# Patient Record
Sex: Female | Born: 1945 | Race: White | Hispanic: No | Marital: Married | Smoking: Never smoker
Health system: Southern US, Community
[De-identification: ages and names within clinical notes are randomized; demographics above are authoritative.]

## PROBLEM LIST (undated history)

## (undated) DIAGNOSIS — H269 Unspecified cataract: Secondary | ICD-10-CM

## (undated) DIAGNOSIS — IMO0002 Reserved for concepts with insufficient information to code with codable children: Secondary | ICD-10-CM

## (undated) DIAGNOSIS — Z8619 Personal history of other infectious and parasitic diseases: Secondary | ICD-10-CM

## (undated) DIAGNOSIS — I10 Essential (primary) hypertension: Secondary | ICD-10-CM

## (undated) DIAGNOSIS — C50919 Malignant neoplasm of unspecified site of unspecified female breast: Secondary | ICD-10-CM

## (undated) DIAGNOSIS — K5792 Diverticulitis of intestine, part unspecified, without perforation or abscess without bleeding: Secondary | ICD-10-CM

## (undated) DIAGNOSIS — E7800 Pure hypercholesterolemia, unspecified: Secondary | ICD-10-CM

## (undated) DIAGNOSIS — L661 Lichen planopilaris, unspecified: Secondary | ICD-10-CM

## (undated) DIAGNOSIS — I2510 Atherosclerotic heart disease of native coronary artery without angina pectoris: Secondary | ICD-10-CM

## (undated) DIAGNOSIS — M1990 Unspecified osteoarthritis, unspecified site: Secondary | ICD-10-CM

## (undated) DIAGNOSIS — D649 Anemia, unspecified: Secondary | ICD-10-CM

## (undated) DIAGNOSIS — I729 Aneurysm of unspecified site: Secondary | ICD-10-CM

## (undated) DIAGNOSIS — K219 Gastro-esophageal reflux disease without esophagitis: Secondary | ICD-10-CM

## (undated) DIAGNOSIS — E119 Type 2 diabetes mellitus without complications: Secondary | ICD-10-CM

## (undated) DIAGNOSIS — T7840XA Allergy, unspecified, initial encounter: Secondary | ICD-10-CM

## (undated) DIAGNOSIS — G4730 Sleep apnea, unspecified: Secondary | ICD-10-CM

## (undated) DIAGNOSIS — C801 Malignant (primary) neoplasm, unspecified: Secondary | ICD-10-CM

## (undated) DIAGNOSIS — R011 Cardiac murmur, unspecified: Secondary | ICD-10-CM

## (undated) DIAGNOSIS — Z5189 Encounter for other specified aftercare: Secondary | ICD-10-CM

## (undated) DIAGNOSIS — Z803 Family history of malignant neoplasm of breast: Secondary | ICD-10-CM

## (undated) DIAGNOSIS — G709 Myoneural disorder, unspecified: Secondary | ICD-10-CM

## (undated) DIAGNOSIS — N6019 Diffuse cystic mastopathy of unspecified breast: Secondary | ICD-10-CM

## (undated) DIAGNOSIS — R32 Unspecified urinary incontinence: Secondary | ICD-10-CM

## (undated) HISTORY — DX: Unspecified cataract: H26.9

## (undated) HISTORY — DX: Encounter for other specified aftercare: Z51.89

## (undated) HISTORY — DX: Essential (primary) hypertension: I10

## (undated) HISTORY — DX: Diverticulitis of intestine, part unspecified, without perforation or abscess without bleeding: K57.92

## (undated) HISTORY — DX: Family history of malignant neoplasm of breast: Z80.3

## (undated) HISTORY — DX: Pure hypercholesterolemia, unspecified: E78.00

## (undated) HISTORY — DX: Anemia, unspecified: D64.9

## (undated) HISTORY — DX: Type 2 diabetes mellitus without complications: E11.9

## (undated) HISTORY — DX: Diffuse cystic mastopathy of unspecified breast: N60.19

## (undated) HISTORY — DX: Unspecified osteoarthritis, unspecified site: M19.90

## (undated) HISTORY — DX: Cardiac murmur, unspecified: R01.1

## (undated) HISTORY — DX: Reserved for concepts with insufficient information to code with codable children: IMO0002

## (undated) HISTORY — PX: PARTIAL HIP ARTHROPLASTY: SHX733

## (undated) HISTORY — DX: Gastro-esophageal reflux disease without esophagitis: K21.9

## (undated) HISTORY — DX: Lichen planopilaris, unspecified: L66.10

## (undated) HISTORY — DX: Allergy, unspecified, initial encounter: T78.40XA

## (undated) HISTORY — PX: FINGER SURGERY: SHX640

## (undated) HISTORY — PX: MASTECTOMY: SHX3

## (undated) HISTORY — DX: Malignant neoplasm of unspecified site of unspecified female breast: C50.919

## (undated) HISTORY — DX: Lichen planopilaris: L66.1

## (undated) HISTORY — DX: Personal history of other infectious and parasitic diseases: Z86.19

## (undated) HISTORY — PX: REPLACEMENT TOTAL KNEE: SUR1224

## (undated) HISTORY — DX: Unspecified urinary incontinence: R32

---

## 1964-03-26 HISTORY — PX: RHINOPLASTY: SHX2354

## 1975-02-24 HISTORY — PX: BREAST SURGERY: SHX581

## 1976-01-25 HISTORY — PX: BREAST SURGERY: SHX581

## 1978-08-26 HISTORY — PX: FOOT SURGERY: SHX648

## 1981-10-24 HISTORY — PX: BREAST SURGERY: SHX581

## 1994-01-24 HISTORY — PX: DILATION AND CURETTAGE OF UTERUS: SHX78

## 1998-07-26 HISTORY — PX: BREAST SURGERY: SHX581

## 1999-01-10 ENCOUNTER — Other Ambulatory Visit: Admission: RE | Admit: 1999-01-10 | Discharge: 1999-01-10 | Payer: Self-pay | Admitting: *Deleted

## 1999-02-20 ENCOUNTER — Other Ambulatory Visit: Admission: RE | Admit: 1999-02-20 | Discharge: 1999-02-20 | Payer: Self-pay | Admitting: *Deleted

## 1999-03-26 ENCOUNTER — Other Ambulatory Visit: Admission: RE | Admit: 1999-03-26 | Discharge: 1999-03-26 | Payer: Self-pay | Admitting: Gastroenterology

## 1999-03-26 ENCOUNTER — Encounter: Payer: Self-pay | Admitting: Gastroenterology

## 1999-03-26 ENCOUNTER — Encounter (INDEPENDENT_AMBULATORY_CARE_PROVIDER_SITE_OTHER): Payer: Self-pay

## 1999-05-08 ENCOUNTER — Encounter: Payer: Self-pay | Admitting: Gastroenterology

## 2000-01-31 ENCOUNTER — Other Ambulatory Visit: Admission: RE | Admit: 2000-01-31 | Discharge: 2000-01-31 | Payer: Self-pay | Admitting: Obstetrics and Gynecology

## 2001-04-17 ENCOUNTER — Other Ambulatory Visit: Admission: RE | Admit: 2001-04-17 | Discharge: 2001-04-17 | Payer: Self-pay | Admitting: Obstetrics and Gynecology

## 2002-04-19 ENCOUNTER — Other Ambulatory Visit: Admission: RE | Admit: 2002-04-19 | Discharge: 2002-04-19 | Payer: Self-pay | Admitting: Obstetrics & Gynecology

## 2003-06-01 ENCOUNTER — Other Ambulatory Visit: Admission: RE | Admit: 2003-06-01 | Discharge: 2003-06-01 | Payer: Self-pay | Admitting: Obstetrics & Gynecology

## 2004-12-26 HISTORY — PX: PLACEMENT OF BREAST IMPLANTS: SHX6334

## 2006-01-09 ENCOUNTER — Ambulatory Visit: Payer: Self-pay

## 2008-07-12 ENCOUNTER — Ambulatory Visit: Payer: Self-pay | Admitting: Unknown Physician Specialty

## 2008-07-26 HISTORY — PX: FOOT SURGERY: SHX648

## 2009-02-07 ENCOUNTER — Encounter (INDEPENDENT_AMBULATORY_CARE_PROVIDER_SITE_OTHER): Payer: Self-pay | Admitting: *Deleted

## 2009-06-26 ENCOUNTER — Ambulatory Visit: Payer: Self-pay | Admitting: Gastroenterology

## 2009-07-10 ENCOUNTER — Ambulatory Visit: Payer: Self-pay | Admitting: Gastroenterology

## 2009-10-25 ENCOUNTER — Encounter (INDEPENDENT_AMBULATORY_CARE_PROVIDER_SITE_OTHER): Payer: Self-pay | Admitting: *Deleted

## 2009-11-07 ENCOUNTER — Ambulatory Visit: Payer: Self-pay | Admitting: Gastroenterology

## 2009-11-07 DIAGNOSIS — K921 Melena: Secondary | ICD-10-CM | POA: Insufficient documentation

## 2009-11-07 DIAGNOSIS — R109 Unspecified abdominal pain: Secondary | ICD-10-CM | POA: Insufficient documentation

## 2009-11-07 DIAGNOSIS — E11319 Type 2 diabetes mellitus with unspecified diabetic retinopathy without macular edema: Secondary | ICD-10-CM | POA: Insufficient documentation

## 2009-11-14 ENCOUNTER — Ambulatory Visit: Payer: Self-pay | Admitting: Gastroenterology

## 2009-11-20 ENCOUNTER — Telehealth: Payer: Self-pay | Admitting: Gastroenterology

## 2009-11-24 ENCOUNTER — Ambulatory Visit: Payer: Self-pay | Admitting: Gastroenterology

## 2009-11-24 LAB — CONVERTED CEMR LAB
Fecal Occult Blood: NEGATIVE
OCCULT 3: NEGATIVE

## 2009-12-11 ENCOUNTER — Ambulatory Visit: Payer: Self-pay | Admitting: Gastroenterology

## 2010-09-27 NOTE — Assessment & Plan Note (Signed)
Summary: ABD PAIN/YF   History of Present Illness Visit Type: new patient  Primary GI MD: Melvia Heaps MD [facility] Primary Provider: Silver Huguenin, MD  Requesting Provider: n/a Chief Complaint: Epigastric pain, bloating, black tarry stools, and acid reflux  History of Present Illness:   Katie [name] is a pleasant 65 year old white female referred at the request of Dr. Hyacinth Meeker for evaluation of melena.  Approximately 3 weeks ago, while on a cruise, she developed sharp, burning upper abdominal pain followed by black, tarry stools.  She was seen by a ship physician.  Stools checked several days later were Hemoccult-negative though she claims the stools had normalized in color by that point.  On October 12, 2009 hemoglobin was 11.4.  She has been taking Prilosec during this time.  While her sharp pain has subsided she continues to complain of burning epigastric pain and some pyrosis.  Screening colonoscopy from November, 2010 was pertinent for mild diverticulosis.  She has a remote history of peptic ulcer disease.  She is on no gastric irritants including nonsteroidals.   GI Review of Systems    Reports abdominal pain, acid reflux, belching, bloating, and  heartburn.     Location of  Abdominal pain: epigastric area.    Denies chest pain, dysphagia with liquids, dysphagia with solids, loss of appetite, nausea, vomiting, vomiting blood, weight loss, and  weight gain.      Reports black tarry stools.     Denies anal fissure, change in bowel habit, constipation, diarrhea, diverticulosis, fecal incontinence, heme positive stool, hemorrhoids, irritable bowel syndrome, jaundice, light color stool, liver problems, rectal bleeding, and  rectal pain.    Current Medications (verified): 1)  Prilosec 20 Mg Cpdr (Omeprazole) .[name].. 1 Tablet By Mouth Once Daily 2)  Cozaar 50 Mg Tabs (Losartan Potassium) .[name].. 1 Tablet By Mouth Once Daily 3)  Lipitor 10 Mg Tabs (Atorvastatin Calcium) .... One Tablet By Mouth Once  Daily 4)  Metformin Hcl 500 Mg Tabs (Metformin Hcl) .... One Tablet By Mouth Once Daily 5)  Claritin 10 Mg Tabs (Loratadine) .... As Needed For Allergies 6)  Flonase 50 Mcg/act Susp (Fluticasone Propionate) .... As Needed 7)  Maalox Max 1000-60 Mg Chew (Calcium Carbonate-Simethicone) .... As Needed  Allergies (verified): 1)  ! Erythromycin 2)  ! Cipro  Past History:  Past Medical History: Diverticulosis GERD Duodenal Ulcer Hypertension Diabetes Hyperlipidemia  Past Surgical History: Bilateral Breast Surgery for Fibrocystic Tissue   Family History: Family History of Breast Cancer:Sister  No FH of Colon Cancer: Family History of Diabetes: Mother, Father, and Brother  Family History of Heart Disease: Father and Brother   Social History: Occupation: Retired Married Patient has never smoked.  Alcohol Use - no Illicit Drug Use - no Smoking Status:  never Drug Use:  no  Review of Systems       The patient complains of allergy/sinus and cough.  The patient denies anemia, anxiety-new, arthritis/joint pain, back pain, blood in urine, breast changes/lumps, change in vision, confusion, coughing up blood, depression-new, fainting, fatigue, fever, headaches-new, hearing problems, heart murmur, heart rhythm changes, itching, menstrual pain, muscle pains/cramps, night sweats, nosebleeds, pregnancy symptoms, shortness of breath, skin rash, sleeping problems, sore throat, swelling of feet/legs, swollen lymph glands, thirst - excessive , urination - excessive , urination changes/pain, urine leakage, vision changes, and voice change.         All other systems were reviewed and were negative   Vital Signs:  Patient profile:   65 year old female  Height:      62 inches Weight:      179 pounds BMI:     32.86 BSA:     1.83 Pulse rate:   80 / minute Pulse rhythm:   regular BP sitting:   132 / 76  (left arm) Cuff size:   regular  Vitals Entered By: Ok Anis CMA (November 07, 2009 3:31  PM)  Physical Exam  Additional Exam:  On physical exam she is a well-developed well-nourished female  skin: anicteric HEENT: normocephalic; PEERLA; no nasal or pharyngeal abnormalities neck: supple nodes: no cervical lymphadenopathy chest: clear to ausculatation and percussion heart: no murmurs, gallops, or rubs abd: soft, nontender; BS normoactive; no abdominal masses, tenderness, organomegaly rectal: deferred ext: no cynanosis, clubbing, edema skeletal: no deformities neuro: oriented x 3; no focal abnormalities    Impression & Recommendations:  Problem # 1:  MELENA (ICD-578.1) By history she had a limited GI bleed.  I suspect she may have active peptic ulcer disease complicated by bleeding.  Upper GI neoplasm and AVMs are other less likely possibilities.  Recommendations #1 continue Prilosec #2 upper endoscopy  Risks, alternatives, and complications of the procedure, including bleeding, perforation, and possible need for surgery, were explained to the patient.  Patient's questions were answered.  Other Orders: EGD (EGD)  Patient Instructions: 1)  Conscious Sedation brochure given.  2)  Upper Endoscopy brochure given.  3)  Your EGD is scheduled for 11/14/2009 at 3:30pm 4)  You have been instructed on your diabetic medications 5)  CC Dr. Rockie Neighbours 6)  The medication list was reviewed and reconciled.  All changed / newly prescribed medications were explained.  A complete medication list was provided to the patient / caregiver.

## 2010-09-27 NOTE — Progress Notes (Signed)
Summary: Sooner Appt.  Phone Note Call from Patient Call back at Home Phone [phone]   Call For: Dr Arlyce Dice Summary of Call: Refused to schedule next available on May 9th because she was told after her procedure she needed a 3 week follow up. Initial call taken by: [name] [facility],  November 20, 2009 9:31 AM  Follow-up for Phone Call        Pt. will see Dr.Coila Wardell on 12-11-09 at 11:15am. Pt. instructed to call back as needed.  Follow-up by: Laureen Ochs LPN,  November 20, 2009 10:46 AM

## 2010-09-27 NOTE — Assessment & Plan Note (Signed)
Summary: F/U FROM ENDOSCOPY               Katie [name]   History of Present Illness Visit Type: Follow-up Visit Primary GI MD: Katie Heaps MD [facility] Primary Provider: Silver Huguenin, MD  Requesting Provider: n/a Chief Complaint: follow up from EGD, pt states abdominal pain and reflux are improving.  Reflux is better on prilosec History of Present Illness:   Katie [name] past return following upper endoscopy.  This exam was entirely normal.  Followup Hemoccults were also negative.  She has no GI complaints.   GI Review of Systems    Reports abdominal pain and  acid reflux.      Denies belching, bloating, chest pain, dysphagia with liquids, dysphagia with solids, heartburn, loss of appetite, nausea, vomiting, vomiting blood, weight loss, and  weight gain.        Denies anal fissure, black tarry stools, change in bowel habit, constipation, diarrhea, diverticulosis, fecal incontinence, heme positive stool, hemorrhoids, irritable bowel syndrome, jaundice, light color stool, liver problems, rectal bleeding, and  rectal pain.    Current Medications (verified): 1)  Prilosec 20 Mg Cpdr (Omeprazole) .[name].. 1 Tablet By Mouth Once Daily 2)  Cozaar 100 Mg Tabs (Losartan Potassium) .... Take 1 Tablet By Mouth Once A Day 3)  Lipitor 10 Mg Tabs (Atorvastatin Calcium) .... One Tablet By Mouth Once Daily 4)  Metformin Hcl 500 Mg Tabs (Metformin Hcl) .... One Tablet By Mouth Once Daily 5)  Claritin 10 Mg Tabs (Loratadine) .... As Needed For Allergies 6)  Flonase 50 Mcg/act Susp (Fluticasone Propionate) .... As Needed 7)  Maalox Max 1000-60 Mg Chew (Calcium Carbonate-Simethicone) .... As Needed  Allergies (verified): 1)  ! Erythromycin 2)  ! Cipro  Past History:  Past Medical History: Reviewed history from 11/07/2009 and no changes required. Diverticulosis GERD Duodenal Ulcer Hypertension Diabetes Hyperlipidemia  Past Surgical History: Reviewed history from 11/07/2009 and no changes  required. Bilateral Breast Surgery for Fibrocystic Tissue   Family History: Reviewed history from 11/07/2009 and no changes required. Family History of Breast Cancer:Sister  No FH of Colon Cancer: Family History of Diabetes: Mother, Father, and Brother  Family History of Heart Disease: Father and Brother   Social History: Reviewed history from 11/07/2009 and no changes required. Occupation: Retired Married Patient has never smoked.  Alcohol Use - no Illicit Drug Use - no  Review of Systems  The patient denies allergy/sinus, anemia, anxiety-new, arthritis/joint pain, back pain, blood in urine, breast changes/lumps, confusion, cough, coughing up blood, depression-new, fainting, fatigue, fever, headaches-new, hearing problems, heart murmur, heart rhythm changes, itching, menstrual pain, muscle pains/cramps, night sweats, nosebleeds, pregnancy symptoms, shortness of breath, skin rash, sleeping problems, sore throat, swelling of feet/legs, swollen lymph glands, thirst - excessive, urination - excessive, urination changes/pain, urine leakage, vision changes, and voice change.    Vital Signs:  Patient profile:   65 year old female Height:      62 inches Weight:      178 pounds BMI:     32.67 Pulse rate:   84 / minute Pulse rhythm:   regular BP sitting:   130 / 80  (left arm) Cuff size:   regular  Vitals Entered By: Francee Piccolo CMA Duncan Dull) (December 11, 2009 10:58 AM)   Impression & Recommendations:  Problem # 1:  MELENA (ICD-578.1) This has clinically resolved.  Presumably etiology for this was active peptic disease.  There is no evidence for ongoing GI disease at this time.  Recommendations #1  taper then discontinue Prilosec  Patient Instructions: 1)  The medication list was reviewed and reconciled.  All changed / newly prescribed medications were explained.  A complete medication list was provided to the patient / caregiver. 2)  cc Katie [name]

## 2010-09-27 NOTE — Letter (Signed)
Summary: New Patient letter  [facility]  [address] [place], [place] [address]   Phone: [phone]  Fax: [phone]       10/25/2009 MRN: [id number]  Katie [name] [address] [place], [place]  [address]  [place]  Dear Katie [name],  Welcome to the Gastroenterology Division at [facility].    You are scheduled to see Dr.  Arlyce Dice on 11-07-09 at 3:30pm on the 3rd floor at [facility], [address]  We ask that you try to arrive at our office 15 minutes prior to your appointment time to allow for check-in.  We would like you to complete the enclosed self-administered evaluation form prior to your visit and bring it with you on the day of your appointment.  We will review it with you.  Also, please bring a complete list of all your medications or, if you prefer, bring the medication bottles and we will list them.  Please bring your insurance card so that we may make a copy of it.  If your insurance requires a referral to see a specialist, please bring your referral form from your primary care physician.  Co-payments are due at the time of your visit and may be paid by cash, check or credit card.     Your office visit will consist of a consult with your physician (includes a physical exam), any laboratory testing he/she may order, scheduling of any necessary diagnostic testing (e.g. x-ray, ultrasound, CT-scan), and scheduling of a procedure (e.g. Endoscopy, Colonoscopy) if required.  Please allow enough time on your schedule to allow for any/all of these possibilities.    If you cannot keep your appointment, please call [phone] to cancel or reschedule prior to your appointment date.  This allows [place] the opportunity to schedule an appointment for another patient in need of care.  If you do not cancel or reschedule by 5 p.m. the business day prior to your appointment date, you will be charged a $50.00 late cancellation/no-show fee.    Thank you for choosing  [facility] for your medical needs.  We appreciate the opportunity to care for you.  Please visit [place] at our website  to learn more about our practice.                     Sincerely,                                                             The Gastroenterology Division

## 2010-09-27 NOTE — Procedures (Signed)
Summary: EGD/[facility]  EGD/[facility]   Imported By: Sherian Rein 11/08/2009 09:58:50  _____________________________________________________________________  External Attachment:    Type:   Image     Comment:   External Document

## 2010-09-27 NOTE — Miscellaneous (Signed)
  Clinical Lists Changes  Medications: Added new medication of HYOMAX-SL 0.125 MG SUBL (HYOSCYAMINE SULFATE) take 2 tabs s.l. q4h prn - Signed Rx of HYOMAX-SL 0.125 MG SUBL (HYOSCYAMINE SULFATE) take 2 tabs s.l. q4h prn;  #15 x 1;  Signed;  Entered by: Louis Meckel MD;  Authorized by: Louis Meckel MD;  Method used: Electronically to [facility]. [facility] [phone]*, [address] [place], [place]  [id number], Ph: [phone], Fax: [phone]    Prescriptions: HYOMAX-SL 0.125 MG SUBL (HYOSCYAMINE SULFATE) take 2 tabs s.l. q4h prn  #15 x 1   Entered and Authorized by:   Louis Meckel MD   Signed by:   Louis Meckel MD on 11/14/2009   Method used:   Electronically to        [facility]. [address] [phone]* (retail)       [address] [place], [place]  [id number]       Ph: [phone]       Fax: [phone]   RxID:   [id number]

## 2010-09-27 NOTE — Procedures (Signed)
Summary: Upper Endoscopy  Patient: Katie [name] Note: All result statuses are Final unless otherwise noted.  Tests: (1) Upper Endoscopy (EGD)   EGD Upper Endoscopy       DONE     [facility]     [address]     [place], [place]  [address]           ENDOSCOPY PROCEDURE REPORT           PATIENT:  Katie [name], Katie [name]  MR#:  [id number]     BIRTHDATE:  1946/05/07, 63 yrs. old  GENDER:  female           ENDOSCOPIST:  Barbette Hair. Arlyce Dice, MD     Referred by:           PROCEDURE DATE:  11/14/2009     PROCEDURE:  EGD, diagnostic     ASA CLASS:  Class II     INDICATIONS:           MEDICATIONS:   Fentanyl 75 mcg IV, Versed 7 mg IV, glycopyrrolate     (Robinal) 0.2 mg IV, 0.6cc simethancone 0.6 cc IV     TOPICAL ANESTHETIC:  Cetacaine Spray           DESCRIPTION OF PROCEDURE:   After the risks benefits and     alternatives of the procedure were thoroughly explained, informed     consent was obtained.  The LB [id number] endoscope was     introduced through the mouth and advanced to the third portion of     the duodenum, without limitations.  The instrument was slowly     withdrawn as the mucosa was fully examined.     <<PROCEDUREIMAGES>>           The upper, middle, and distal third of the esophagus were     carefully inspected and no abnormalities were noted. The z-line     was well seen at the GEJ. The endoscope was pushed into the fundus     which was normal including a retroflexed view. The antrum,gastric     body, first and second part of the duodenum were unremarkable (see     image1, image2, image3, image4, image5, image6, image7, image8,     and image9).    Retroflexed views revealed no abnormalities.     The scope was then withdrawn from the patient and the procedure     completed.           COMPLICATIONS:  None           ENDOSCOPIC IMPRESSION:     1) Normal EGD     RECOMMENDATIONS:     1) continue current medications - prilosec     2) hyomax as needed  3) Call office next 2-3 days to schedule an office appointment     for 3 weeks           REPEAT EXAM:  No           ______________________________     Barbette Hair. Arlyce Dice, MD           CC:  Silver Huguenin, MD           n.     Rosalie Doctor:   Barbette Hair. Kaplan at 11/14/2009 02:55 PM           Jani Gravel, [phone]  Note: An exclamation mark (!) indicates a result that was not dispersed into the flowsheet. Document Creation Date: 11/14/2009 2:55 PM  _______________________________________________________________________  (1) Order result status: Final Collection or observation date-time: 11/14/2009 14:46 Requested date-time:  Receipt date-time:  Reported date-time:  Referring Physician:   Ordering Physician: Melvia Heaps [phone]) Specimen Source:  Source: Launa Grill Order Number: [id number] Lab site:

## 2010-09-27 NOTE — Letter (Signed)
Summary: EGD Instructions  [facility]  [address] [place], [place] [address]   Phone: [phone]  Fax: [phone]       Katie [name]    06-04-1946    MRN: [id number]       Procedure Day /Date:TUESDAY 11/14/2009     Arrival Time:2:30PM     Procedure Time:3:30PM     Location of Procedure:                    X  [facility] (4th Floor)   PREPARATION FOR ENDOSCOPY   On 11/14/2009  THE DAY OF THE PROCEDURE:  1.   No solid foods, milk or milk products are allowed after midnight the night before your procedure.  2.   Do not drink anything colored red or purple.  Avoid juices with pulp.  No orange juice.  3.  You may drink clear liquids until 1:30PM  , which is 2 hours before your procedure.                                                                                                CLEAR LIQUIDS INCLUDE: Water Jello Ice Popsicles Tea (sugar ok, no milk/cream) Powdered fruit flavored drinks Coffee (sugar ok, no milk/cream) Gatorade Juice: apple, white grape, white cranberry  Lemonade Clear bullion, consomm, broth Carbonated beverages (any kind) Strained chicken noodle soup Hard Candy   MEDICATION INSTRUCTIONS  Unless otherwise instructed, you should take regular prescription medications with a small sip of water as early as possible the morning of your procedure.  Diabetic patients - see separate instructions.           OTHER INSTRUCTIONS  You will need a responsible adult at least 66 years of age to accompany you and drive you home.   This person must remain in the waiting room during your procedure.  Wear loose fitting clothing that is easily removed.  Leave jewelry and other valuables at home.  However, you may wish to bring a book to read or an iPod/MP3 player to listen to music as you wait for your procedure to start.  Remove all body piercing jewelry and leave at home.  Total time from sign-in until discharge is  approximately 2-3 hours.  You should go home directly after your procedure and rest.  You can resume normal activities the day after your procedure.  The day of your procedure you should not:   Drive   Make legal decisions   Operate machinery   Drink alcohol   Return to work  You will receive specific instructions about eating, activities and medications before you leave.    The above instructions have been reviewed and explained to me by   _______________________    I fully understand and can verbalize these instructions _____________________________ Date _________

## 2010-09-27 NOTE — Progress Notes (Signed)
Summary: [facility], [facility]   Imported By: Sherian Rein 11/08/2009 09:59:54  _____________________________________________________________________  External Attachment:    Type:   Image     Comment:   External Document

## 2010-09-27 NOTE — Letter (Signed)
Summary: Diabetic Instructions  [facility]  [address] [place], [place] [address]   Phone: [phone]  Fax: [phone]    Katie [name] Jun 13, 1946 MRN: [id number]   X   ORAL DIABETIC MEDICATION INSTRUCTIONS  METFORMIN  The day before your procedure:   Take your diabetic pill as you do normally  The day of your procedure:   Do not take your diabetic pill    We will check your blood sugar levels during the admission process and again in Recovery before discharging you home  ________________________________________________________________________  _  _   INSULIN (LONG ACTING) MEDICATION INSTRUCTIONS (Lantus, NPH, 70/30, Humulin, Novolin-N)   The day before your procedure:   Take  your regular evening dose    The day of your procedure:   Do not take your morning dose    _  _   INSULIN (SHORT ACTING) MEDICATION INSTRUCTIONS (Regular, Humulog, Novolog)   The day before your procedure:   Do not take your evening dose   The day of your procedure:   Do not take your morning dose   _  _   INSULIN PUMP MEDICATION INSTRUCTIONS  We will contact the physician managing your diabetic care for written dosage instructions for the day before your procedure and the day of your procedure.  Once we have received the instructions, we will contact you.

## 2010-11-19 LAB — GLUCOSE, CAPILLARY
Glucose-Capillary: 101 mg/dL — ABNORMAL HIGH (ref 70–99)
Glucose-Capillary: 113 mg/dL — ABNORMAL HIGH (ref 70–99)

## 2010-11-28 LAB — GLUCOSE, CAPILLARY: Glucose-Capillary: 116 mg/dL — ABNORMAL HIGH (ref 70–99)

## 2011-01-04 ENCOUNTER — Ambulatory Visit: Payer: Self-pay | Admitting: Unknown Physician Specialty

## 2011-07-31 ENCOUNTER — Ambulatory Visit: Payer: Self-pay | Admitting: Unknown Physician Specialty

## 2011-11-05 ENCOUNTER — Ambulatory Visit: Payer: Self-pay | Admitting: Unknown Physician Specialty

## 2011-12-12 ENCOUNTER — Emergency Department: Payer: Self-pay | Admitting: Emergency Medicine

## 2012-08-06 ENCOUNTER — Ambulatory Visit: Payer: Self-pay | Admitting: Unknown Physician Specialty

## 2012-10-12 ENCOUNTER — Telehealth: Payer: Self-pay | Admitting: Gastroenterology

## 2012-10-12 NOTE — Telephone Encounter (Signed)
Offered to give pt an appt with PA tomorrow but pt would rather see MD. Pt scheduled to see Dr. Arlyce Dice 10/14/12@10 :Katie [name] to fax records and notify pt of appt date and time.

## 2012-10-13 ENCOUNTER — Encounter: Payer: Self-pay | Admitting: *Deleted

## 2012-10-14 ENCOUNTER — Ambulatory Visit (INDEPENDENT_AMBULATORY_CARE_PROVIDER_SITE_OTHER): Payer: Medicare Other | Admitting: Gastroenterology

## 2012-10-14 ENCOUNTER — Encounter: Payer: Self-pay | Admitting: Gastroenterology

## 2012-10-14 VITALS — BP 130/68 | HR 64 | Ht 62.0 in | Wt 182.8 lb

## 2012-10-14 DIAGNOSIS — R1032 Left lower quadrant pain: Secondary | ICD-10-CM

## 2012-10-14 MED ORDER — HYOSCYAMINE SULFATE ER 0.375 MG PO TBCR: EXTENDED_RELEASE_TABLET | ORAL | Status: DC

## 2012-10-14 NOTE — Patient Instructions (Addendum)
Follow up as needed

## 2012-10-14 NOTE — Assessment & Plan Note (Addendum)
The patient probably had low-grade diverticulitis in December, 2013. He has residual, mild left lower quadrant pain which likely is related to this. I doubt she has an ongoing acute inflammatory process.  Recommendations #1 hyomax as needed for pain

## 2012-10-14 NOTE — Progress Notes (Signed)
History of Present Illness: Katie [name] 67 year old white female referred at the request of Dr. Hyacinth Meeker for evaluation of abdominal pain. In late December, 2013 she developed sharp left lower quadrant pain. CT scan at [facility], by report, showed minimal thickening of the sigmoid colon raising the question of sigmoid diverticulitis. She was treated with antibiotics with resolution of the severe pain. Since that time she's had intermittent, mild residual discomfort in the left lower quadrant. There has been no change in bowel habits. She has had no bleeding. Screening colonoscopy in 2010 demonstrated diverticulosis. Upper endoscopy in 2011 was normal.    Past Medical History  Diagnosis Date  . Hypertension   . DJD (degenerative joint disease)   . DM type 2 (diabetes mellitus, type 2)   . Hypercholesteremia   . Lichen planopilaris   . Fibrocystic breast disease   . Anemia    Past Surgical History  Procedure Laterality Date  . Breast implants    . Dilation and curettage of uterus    . Finger surgery     family history includes Breast cancer in an unspecified family member; Coronary artery disease in an unspecified family member; Diabetes in an unspecified family member; and Leukemia in an unspecified family member. Current Outpatient Prescriptions  Medication Sig Dispense Refill  . atorvastatin (LIPITOR) 10 MG tablet Take 10 mg by mouth daily.      [name] azelastine (ASTELIN) 137 MCG/SPRAY nasal spray Place 1 spray into the nose 2 (two) times daily. Use in each nostril as directed      . calcium carbonate (OS-CAL) 600 MG TABS Take 600 mg by mouth daily.      . celecoxib (CELEBREX) 200 MG capsule Take 200 mg by mouth 2 (two) times daily.      [name] esomeprazole (NEXIUM) 40 MG capsule Take 40 mg by mouth daily before breakfast.      . fluticasone (FLONASE) 50 MCG/ACT nasal spray Place 2 sprays into the nose daily.      . hydrochlorothiazide (HYDRODIURIL) 25 MG tablet Take 25 mg by mouth  daily.      [name] loratadine (CLARITIN) 10 MG tablet Take 10 mg by mouth daily.      [name] losartan (COZAAR) 100 MG tablet Take 100 mg by mouth daily.      . pioglitazone (ACTOS) 15 MG tablet Take 15 mg by mouth daily.       No current facility-administered medications for this visit.   Allergies as of 10/14/2012 - Review Complete 10/14/2012  Allergen Reaction Noted  . Ciprofloxacin  06/26/2009  . Daypro (oxaprozin)  10/13/2012  . Erythromycin  06/26/2009  . Flonase (fluticasone propionate)  10/13/2012  . Nsaids  10/13/2012  . Vioxx (rofecoxib)  10/13/2012    reports that she has never smoked. She has never used smokeless tobacco. She reports that she does not drink alcohol or use illicit drugs.     Review of Systems: Pertinent positive and negative review of systems were noted in the above HPI section. All other review of systems were otherwise negative.  Vital signs were reviewed in today's medical record Physical Exam: General: Well developed , well nourished, no acute distress Skin: anicteric Head: Normocephalic and atraumatic Eyes:  sclerae anicteric, EOMI Ears: Normal auditory acuity Mouth: No deformity or lesions Neck: Supple, no masses or thyromegaly Lungs: Clear throughout to auscultation Heart: Regular rate and rhythm; no murmurs, rubs or bruits Abdomen: Soft, non tender and non distended. No masses, hepatosplenomegaly or hernias noted. Normal  Bowel sounds Rectal:deferred Musculoskeletal: Symmetrical with no gross deformities  Skin: No lesions on visible extremities Pulses:  Normal pulses noted Extremities: No clubbing, cyanosis, edema or deformities noted Neurological: Alert oriented x 4, grossly nonfocal Cervical Nodes:  No significant cervical adenopathy Inguinal Nodes: No significant inguinal adenopathy Psychological:  Alert and cooperative. Normal mood and affect

## 2013-02-01 ENCOUNTER — Ambulatory Visit: Payer: Self-pay | Admitting: Unknown Physician Specialty

## 2013-04-14 ENCOUNTER — Ambulatory Visit: Payer: Self-pay | Admitting: Unknown Physician Specialty

## 2013-04-19 HISTORY — PX: JOINT REPLACEMENT: SHX530

## 2013-04-23 ENCOUNTER — Encounter: Payer: Self-pay | Admitting: Internal Medicine

## 2013-04-26 ENCOUNTER — Encounter: Payer: Self-pay | Admitting: Internal Medicine

## 2013-04-29 LAB — CBC WITH DIFFERENTIAL/PLATELET
Basophil #: 0.1 10*3/uL (ref 0.0–0.1)
Basophil %: 0.8 %
MCH: 32.5 pg (ref 26.0–34.0)
Monocyte #: 0.7 x10 3/mm (ref 0.2–0.9)
Monocyte %: 9.6 %
Neutrophil %: 63.9 %
Platelet: 378 10*3/uL (ref 150–440)
RDW: 15.5 % — ABNORMAL HIGH (ref 11.5–14.5)
WBC: 7.1 10*3/uL (ref 3.6–11.0)

## 2013-05-26 ENCOUNTER — Encounter: Payer: Self-pay | Admitting: Internal Medicine

## 2013-08-31 DIAGNOSIS — S72009D Fracture of unspecified part of neck of unspecified femur, subsequent encounter for closed fracture with routine healing: Secondary | ICD-10-CM | POA: Diagnosis not present

## 2013-10-28 DIAGNOSIS — Z79899 Other long term (current) drug therapy: Secondary | ICD-10-CM | POA: Diagnosis not present

## 2013-10-28 DIAGNOSIS — E785 Hyperlipidemia, unspecified: Secondary | ICD-10-CM | POA: Diagnosis not present

## 2013-11-04 DIAGNOSIS — E785 Hyperlipidemia, unspecified: Secondary | ICD-10-CM | POA: Diagnosis not present

## 2013-11-04 DIAGNOSIS — E119 Type 2 diabetes mellitus without complications: Secondary | ICD-10-CM | POA: Diagnosis not present

## 2013-11-29 DIAGNOSIS — R42 Dizziness and giddiness: Secondary | ICD-10-CM | POA: Diagnosis not present

## 2013-11-29 DIAGNOSIS — T887XXA Unspecified adverse effect of drug or medicament, initial encounter: Secondary | ICD-10-CM | POA: Diagnosis not present

## 2013-12-08 DIAGNOSIS — L819 Disorder of pigmentation, unspecified: Secondary | ICD-10-CM | POA: Diagnosis not present

## 2013-12-08 DIAGNOSIS — L719 Rosacea, unspecified: Secondary | ICD-10-CM | POA: Diagnosis not present

## 2013-12-08 DIAGNOSIS — L439 Lichen planus, unspecified: Secondary | ICD-10-CM | POA: Diagnosis not present

## 2013-12-29 DIAGNOSIS — N390 Urinary tract infection, site not specified: Secondary | ICD-10-CM | POA: Diagnosis not present

## 2013-12-29 DIAGNOSIS — R300 Dysuria: Secondary | ICD-10-CM | POA: Diagnosis not present

## 2014-02-09 DIAGNOSIS — E785 Hyperlipidemia, unspecified: Secondary | ICD-10-CM | POA: Diagnosis not present

## 2014-02-15 DIAGNOSIS — E785 Hyperlipidemia, unspecified: Secondary | ICD-10-CM | POA: Diagnosis not present

## 2014-02-15 DIAGNOSIS — E119 Type 2 diabetes mellitus without complications: Secondary | ICD-10-CM | POA: Diagnosis not present

## 2014-03-01 DIAGNOSIS — Z96649 Presence of unspecified artificial hip joint: Secondary | ICD-10-CM | POA: Diagnosis not present

## 2014-03-01 DIAGNOSIS — Z471 Aftercare following joint replacement surgery: Secondary | ICD-10-CM | POA: Diagnosis not present

## 2014-03-01 DIAGNOSIS — Z9660 Presence of unspecified orthopedic joint implant: Secondary | ICD-10-CM | POA: Insufficient documentation

## 2014-04-18 DIAGNOSIS — Z23 Encounter for immunization: Secondary | ICD-10-CM | POA: Diagnosis not present

## 2014-05-16 DIAGNOSIS — E119 Type 2 diabetes mellitus without complications: Secondary | ICD-10-CM | POA: Diagnosis not present

## 2014-05-16 DIAGNOSIS — E785 Hyperlipidemia, unspecified: Secondary | ICD-10-CM | POA: Diagnosis not present

## 2014-05-16 LAB — LIPID PANEL
CHOLESTEROL: 150 mg/dL (ref 0–200)
HDL: 40 mg/dL (ref 35–70)
LDL CALC: 72 mg/dL
LDl/HDL Ratio: 3.8
Triglycerides: 191 mg/dL — AB (ref 40–160)

## 2014-05-23 DIAGNOSIS — S139XXA Sprain of joints and ligaments of unspecified parts of neck, initial encounter: Secondary | ICD-10-CM | POA: Diagnosis not present

## 2014-05-23 DIAGNOSIS — M47812 Spondylosis without myelopathy or radiculopathy, cervical region: Secondary | ICD-10-CM | POA: Diagnosis not present

## 2014-05-23 DIAGNOSIS — E119 Type 2 diabetes mellitus without complications: Secondary | ICD-10-CM | POA: Diagnosis not present

## 2014-05-23 DIAGNOSIS — M542 Cervicalgia: Secondary | ICD-10-CM | POA: Diagnosis not present

## 2014-05-23 LAB — HEMOGLOBIN A1C: Hgb A1c MFr Bld: 6 % (ref 4.0–6.0)

## 2014-05-30 DIAGNOSIS — H43813 Vitreous degeneration, bilateral: Secondary | ICD-10-CM | POA: Diagnosis not present

## 2014-06-02 DIAGNOSIS — L659 Nonscarring hair loss, unspecified: Secondary | ICD-10-CM | POA: Diagnosis not present

## 2014-06-02 DIAGNOSIS — L718 Other rosacea: Secondary | ICD-10-CM | POA: Diagnosis not present

## 2014-06-02 DIAGNOSIS — L661 Lichen planopilaris: Secondary | ICD-10-CM | POA: Diagnosis not present

## 2014-06-02 LAB — HM DIABETES EYE EXAM

## 2014-07-08 DIAGNOSIS — E119 Type 2 diabetes mellitus without complications: Secondary | ICD-10-CM | POA: Diagnosis not present

## 2014-07-08 DIAGNOSIS — I10 Essential (primary) hypertension: Secondary | ICD-10-CM | POA: Diagnosis not present

## 2014-07-08 DIAGNOSIS — M47812 Spondylosis without myelopathy or radiculopathy, cervical region: Secondary | ICD-10-CM | POA: Diagnosis not present

## 2014-07-08 DIAGNOSIS — E780 Pure hypercholesterolemia: Secondary | ICD-10-CM | POA: Diagnosis not present

## 2014-07-25 DIAGNOSIS — J069 Acute upper respiratory infection, unspecified: Secondary | ICD-10-CM | POA: Diagnosis not present

## 2014-07-27 DIAGNOSIS — M8580 Other specified disorders of bone density and structure, unspecified site: Secondary | ICD-10-CM | POA: Diagnosis not present

## 2014-07-27 DIAGNOSIS — Z1231 Encounter for screening mammogram for malignant neoplasm of breast: Secondary | ICD-10-CM | POA: Diagnosis not present

## 2014-07-27 LAB — HM MAMMOGRAPHY: HM Mammogram: NORMAL

## 2014-07-28 ENCOUNTER — Ambulatory Visit: Payer: Medicare Other | Admitting: Family Medicine

## 2014-08-02 ENCOUNTER — Ambulatory Visit (INDEPENDENT_AMBULATORY_CARE_PROVIDER_SITE_OTHER): Payer: Medicare Other | Admitting: Family Medicine

## 2014-08-02 ENCOUNTER — Encounter: Payer: Self-pay | Admitting: Family Medicine

## 2014-08-02 VITALS — BP 130/68 | HR 80 | Temp 98.3°F | Ht 62.25 in | Wt 166.5 lb

## 2014-08-02 DIAGNOSIS — L661 Lichen planopilaris: Secondary | ICD-10-CM

## 2014-08-02 DIAGNOSIS — Z8719 Personal history of other diseases of the digestive system: Secondary | ICD-10-CM | POA: Diagnosis not present

## 2014-08-02 DIAGNOSIS — E7800 Pure hypercholesterolemia, unspecified: Secondary | ICD-10-CM | POA: Insufficient documentation

## 2014-08-02 DIAGNOSIS — M47812 Spondylosis without myelopathy or radiculopathy, cervical region: Secondary | ICD-10-CM

## 2014-08-02 DIAGNOSIS — I10 Essential (primary) hypertension: Secondary | ICD-10-CM

## 2014-08-02 DIAGNOSIS — E119 Type 2 diabetes mellitus without complications: Secondary | ICD-10-CM

## 2014-08-02 DIAGNOSIS — E1169 Type 2 diabetes mellitus with other specified complication: Secondary | ICD-10-CM | POA: Insufficient documentation

## 2014-08-02 DIAGNOSIS — L259 Unspecified contact dermatitis, unspecified cause: Secondary | ICD-10-CM | POA: Insufficient documentation

## 2014-08-02 DIAGNOSIS — I152 Hypertension secondary to endocrine disorders: Secondary | ICD-10-CM | POA: Insufficient documentation

## 2014-08-02 DIAGNOSIS — J302 Other seasonal allergic rhinitis: Secondary | ICD-10-CM

## 2014-08-02 LAB — HM DIABETES FOOT EXAM

## 2014-08-02 MED ORDER — TRIAMCINOLONE ACETONIDE 0.5 % EX CREA: 1.0000 "application " | TOPICAL_CREAM | Freq: Two times a day (BID) | CUTANEOUS | Status: DC

## 2014-08-02 NOTE — Assessment & Plan Note (Signed)
Well controlleld on Astelin and flonase.

## 2014-08-02 NOTE — Assessment & Plan Note (Signed)
Well controlled on losartan and HCTZ.

## 2014-08-02 NOTE — Assessment & Plan Note (Signed)
Well controlled. Continue current medication. Encouraged exercise, weight loss, healthy eating habits.  

## 2014-08-02 NOTE — Assessment & Plan Note (Signed)
Treat with topical steroid 

## 2014-08-02 NOTE — Patient Instructions (Addendum)
Schedule to return for  medicare wellness visit in next 1-2 months with fasting labs prior.Folloe BP at home. Keep on working on healthy eating habits and weight loss and exercise.

## 2014-08-02 NOTE — Assessment & Plan Note (Signed)
Well controlled on lipitor 10 mg daily at last check.

## 2014-08-02 NOTE — Progress Notes (Signed)
Subjective:    Patient ID: Katie [name], female    DOB: 29-Dec-1945, 68 y.o.   MRN: [id number]  HPI  68 year old female presents to establish.  She previously saw Dr. Sabra Heck with [facility].  Last OV over 1 year ago. Last CPX 07/2013. Last PAP 2014. She has had mammogram and DEXA in last week.  She is currently on upper respiratory tract infection on amox x 10 days. Almost better.  Diabetes: Well controlled on metformin.  She has SE to actos, off now.  She has lost 27 lbs in last year. Last A1C 6.0 in 03/2014 Using medications without difficulties:None Hypoglycemic episodes:None Hyperglycemic episodes:None Feet problems:None Blood Sugars averaging: FBS 115-129, 2 hours after meals 118 eye exam within last year: OCt 2015.  Hypertension:   Since weight loss she has had BP get lower 130/54-60 on losartan  and HCTZ. BP Readings from Last 3 Encounters:  08/02/14 130/68  10/14/12 130/68  12/11/09 130/80  Using medication without problems or lightheadedness: None Chest pain with exertion:None Edema: None Short of breath:None Average home BPs: 130-140/54-60 Other issues:  She exercises 2-3 times a week. Diet: low carb diet, weight watchers.  She has two areas of new skin lesions on buttock.. Red spots on B buttock at pelvic bone. Noted for 1 week. Not itching, non tender. She did sit down very hard in taxi  2weeks ago.  She is uptodate with vaccines.. Including Tdap , flu and prevnar.         Review of Systems  Constitutional: Negative for fever and fatigue.  HENT: Negative for ear pain.   Eyes: Negative for pain.  Respiratory: Negative for chest tightness and shortness of breath.   Cardiovascular: Negative for chest pain, palpitations and leg swelling.  Gastrointestinal: Negative for abdominal pain.  Genitourinary: Negative for dysuria.       Objective:   Physical Exam  Constitutional: Vital signs are normal. She appears well-developed and well-nourished.  She is cooperative.  Non-toxic appearance. She does not appear ill. No distress.  HENT:  Head: Normocephalic.  Right Ear: Hearing, tympanic membrane, external ear and ear canal normal.  Left Ear: Hearing, tympanic membrane, external ear and ear canal normal.  Nose: Nose normal.  Eyes: Conjunctivae, EOM and lids are normal. Pupils are equal, round, and reactive to light. Lids are everted and swept, no foreign bodies found.  Neck: Trachea normal and normal range of motion. Neck supple. Carotid bruit is not present. No thyroid mass and no thyromegaly present.  Cardiovascular: Normal rate, regular rhythm, S1 normal, S2 normal, normal heart sounds and intact distal pulses.  Exam reveals no gallop.   No murmur heard. Pulmonary/Chest: Effort normal and breath sounds normal. No respiratory distress. She has no wheezes. She has no rhonchi. She has no rales.  Abdominal: Soft. Normal appearance and bowel sounds are normal. She exhibits no distension, no fluid wave, no abdominal bruit and no mass. There is no hepatosplenomegaly. There is no tenderness. There is no rebound, no guarding and no CVA tenderness. No hernia.  Lymphadenopathy:    She has no cervical adenopathy.    She has no axillary adenopathy.  Neurological: She is alert. She has normal strength. No cranial nerve deficit or sensory deficit.  Skin: Skin is warm, dry and intact. No rash noted.  Central bilateral erythematous lseions well circumscribes, dry rough patch  Psychiatric: Her speech is normal and behavior is normal. Judgment normal. Her mood appears not anxious. Cognition and memory are  normal. She does not exhibit a depressed mood.          Assessment & Plan:

## 2014-08-04 ENCOUNTER — Telehealth: Payer: Self-pay | Admitting: Family Medicine

## 2014-08-04 NOTE — Telephone Encounter (Signed)
emmi mailed  °

## 2014-08-08 ENCOUNTER — Other Ambulatory Visit: Payer: Self-pay | Admitting: *Deleted

## 2014-08-08 MED ORDER — GLUCOSE BLOOD VI STRP: ORAL_STRIP | Status: DC

## 2014-08-10 ENCOUNTER — Other Ambulatory Visit: Payer: Self-pay | Admitting: Family Medicine

## 2014-09-08 ENCOUNTER — Encounter: Payer: Self-pay | Admitting: Family Medicine

## 2014-09-28 ENCOUNTER — Telehealth: Payer: Self-pay | Admitting: Family Medicine

## 2014-09-28 DIAGNOSIS — E7800 Pure hypercholesterolemia, unspecified: Secondary | ICD-10-CM

## 2014-09-28 DIAGNOSIS — E119 Type 2 diabetes mellitus without complications: Secondary | ICD-10-CM

## 2014-09-28 NOTE — Telephone Encounter (Signed)
-----   Message from Ellamae Sia sent at 09/22/2014  5:02 PM EST ----- Regarding: Lab orders for Wednesday, 2.3.16 Patient is scheduled for CPX labs, please order future labs, Thanks , Karna Christmas

## 2014-09-29 ENCOUNTER — Other Ambulatory Visit (INDEPENDENT_AMBULATORY_CARE_PROVIDER_SITE_OTHER): Payer: Medicare Other

## 2014-09-29 DIAGNOSIS — E119 Type 2 diabetes mellitus without complications: Secondary | ICD-10-CM

## 2014-09-29 DIAGNOSIS — E7800 Pure hypercholesterolemia, unspecified: Secondary | ICD-10-CM

## 2014-09-29 LAB — COMPREHENSIVE METABOLIC PANEL
ALK PHOS: 86 U/L (ref 39–117)
ALT: 19 U/L (ref 0–35)
AST: 19 U/L (ref 0–37)
Albumin: 4.4 g/dL (ref 3.5–5.2)
BUN: 20 mg/dL (ref 6–23)
CALCIUM: 10 mg/dL (ref 8.4–10.5)
CHLORIDE: 100 meq/L (ref 96–112)
CO2: 24 mEq/L (ref 19–32)
Creatinine, Ser: 1.04 mg/dL (ref 0.40–1.20)
GFR: 55.71 mL/min — ABNORMAL LOW (ref >60.00)
GLUCOSE: 140 mg/dL — AB (ref 70–99)
POTASSIUM: 4.5 meq/L (ref 3.5–5.1)
Sodium: 136 mEq/L (ref 135–145)
TOTAL PROTEIN: 7.5 g/dL (ref 6.0–8.3)
Total Bilirubin: 0.5 mg/dL (ref 0.2–1.2)

## 2014-09-29 LAB — LIPID PANEL
Cholesterol: 160 mg/dL (ref 0–200)
HDL: 42.6 mg/dL (ref >39.00)
LDL CALC: 84 mg/dL (ref 0–99)
NONHDL: 117.4
Total CHOL/HDL Ratio: 4
Triglycerides: 166 mg/dL — ABNORMAL HIGH (ref 0.0–149.0)
VLDL: 33.2 mg/dL (ref 0.0–40.0)

## 2014-09-29 LAB — HEMOGLOBIN A1C: Hgb A1c MFr Bld: 6 % (ref 4.6–6.5)

## 2014-10-04 ENCOUNTER — Encounter: Payer: Self-pay | Admitting: Family Medicine

## 2014-10-04 ENCOUNTER — Other Ambulatory Visit (HOSPITAL_COMMUNITY)
Admission: RE | Admit: 2014-10-04 | Discharge: 2014-10-04 | Disposition: A | Discharge status: Home / Self Care | Payer: Medicare Other | Source: Ambulatory Visit | Attending: Family Medicine | Admitting: Family Medicine

## 2014-10-04 ENCOUNTER — Ambulatory Visit (INDEPENDENT_AMBULATORY_CARE_PROVIDER_SITE_OTHER): Payer: Medicare Other | Admitting: Family Medicine

## 2014-10-04 VITALS — BP 120/66 | HR 70 | Temp 98.3°F | Ht 62.25 in | Wt 169.0 lb

## 2014-10-04 DIAGNOSIS — I10 Essential (primary) hypertension: Secondary | ICD-10-CM

## 2014-10-04 DIAGNOSIS — R300 Dysuria: Secondary | ICD-10-CM | POA: Diagnosis not present

## 2014-10-04 DIAGNOSIS — Z1151 Encounter for screening for human papillomavirus (HPV): Secondary | ICD-10-CM | POA: Insufficient documentation

## 2014-10-04 DIAGNOSIS — Z7189 Other specified counseling: Secondary | ICD-10-CM

## 2014-10-04 DIAGNOSIS — M810 Age-related osteoporosis without current pathological fracture: Secondary | ICD-10-CM | POA: Insufficient documentation

## 2014-10-04 DIAGNOSIS — Z0000 Encounter for general adult medical examination without abnormal findings: Secondary | ICD-10-CM

## 2014-10-04 DIAGNOSIS — Z124 Encounter for screening for malignant neoplasm of cervix: Secondary | ICD-10-CM | POA: Diagnosis present

## 2014-10-04 DIAGNOSIS — E7800 Pure hypercholesterolemia, unspecified: Secondary | ICD-10-CM

## 2014-10-04 DIAGNOSIS — M8580 Other specified disorders of bone density and structure, unspecified site: Secondary | ICD-10-CM

## 2014-10-04 DIAGNOSIS — E119 Type 2 diabetes mellitus without complications: Secondary | ICD-10-CM

## 2014-10-04 LAB — POCT URINALYSIS DIPSTICK
BILIRUBIN UA: NEGATIVE
Blood, UA: NEGATIVE
GLUCOSE UA: NEGATIVE
Ketones, UA: NEGATIVE
Leukocytes, UA: NEGATIVE
NITRITE UA: NEGATIVE
PH UA: 7.5
Protein, UA: NEGATIVE
Spec Grav, UA: 1.01
UROBILINOGEN UA: 0.2

## 2014-10-04 LAB — HM DIABETES FOOT EXAM

## 2014-10-04 NOTE — Progress Notes (Signed)
Pre visit review using our clinic review tool, if applicable. No additional management support is needed unless otherwise documented below in the visit note. 

## 2014-10-04 NOTE — Progress Notes (Signed)
Subjective:    Patient ID: Katie [name], female    DOB: [birth year], 69 y.o.   MRN: [id number]  HPI I have personally reviewed the Medicare Annual Wellness questionnaire and have noted 1. The patient's medical and social history 2. Their use of alcohol, tobacco or illicit drugs 3. Their current medications and supplements 4. The patient's functional ability including ADL's, fall risks, home safety risks and hearing or visual             impairment. 5. Diet and physical activities 6. Evidence for depression or mood disorders The patients weight, height, BMI and visual acuity have been recorded in the chart I have made referrals, counseling and provided education to the patient based review of the above and I have provided the pt with a written personalized care plan for preventive services.  Diabetes: Well controlled on metformin. She has SE to actos, off now. She has lost 27 lbs in last year. Last A1C 6.0 in 03/2014   Lab Results  Component Value Date   HGBA1C 6.0 09/29/2014   Using medications without difficulties:None Hypoglycemic episodes:None Hyperglycemic episodes:None Feet problems:None Blood Sugars averaging: FBS 115-129, 2 hours after meals 118 eye exam within last year: Oct 2015.  Elevated Cholesterol: At goal LDL < 100 on lipitor 10 mg Lab Results  Component Value Date   CHOL 160 09/29/2014   HDL 42.60 09/29/2014   LDLCALC 84 09/29/2014   TRIG 166.0* 09/29/2014   CHOLHDL 4 09/29/2014  Using medications without problems:None Muscle aches: None   Hypertension:   Well controlled on losartan and HCTZ. BP Readings from Last 3 Encounters:  10/04/14 120/66  08/02/14 130/68  10/14/12 130/68  Using medication without problems or lightheadedness: None Chest pain with exertion:None Edema: None Short of breath:None Average home BPs: 130-140/54-60 Other issues:  Wt Readings from Last 3 Encounters:  10/04/14 169 lb (76.658 kg)  08/02/14 166 lb 8 oz (75.524  kg)  10/14/12 182 lb 12.8 oz (82.918 kg)    She exercises 2-3 times a week.  Diet: low carb diet, weight watchers.  Review of Systems  Constitutional: Negative for fever and fatigue.  HENT: Negative for ear pain.   Eyes: Negative for pain.  Respiratory: Negative for chest tightness and shortness of breath.   Cardiovascular: Negative for chest pain, palpitations and leg swelling.  Gastrointestinal: Negative for abdominal pain.  Genitourinary: Negative for dysuria, urgency, frequency, hematuria and flank pain.       ? Funny feeling when she urinates, not really burning in last few days.       Objective:   Physical Exam  Constitutional: Vital signs are normal. She appears well-developed and well-nourished. She is cooperative.  Non-toxic appearance. She does not appear ill. No distress.  HENT:  Head: Normocephalic.  Right Ear: Hearing, tympanic membrane, external ear and ear canal normal.  Left Ear: Hearing, tympanic membrane, external ear and ear canal normal.  Nose: Nose normal.  Eyes: Conjunctivae, EOM and lids are normal. Pupils are equal, round, and reactive to light. Lids are everted and swept, no foreign bodies found.  Neck: Trachea normal and normal range of motion. Neck supple. Carotid bruit is not present. No thyroid mass and no thyromegaly present.  Cardiovascular: Normal rate, regular rhythm, S1 normal, S2 normal, normal heart sounds and intact distal pulses.  Exam reveals no gallop.   No murmur heard. Pulmonary/Chest: Effort normal and breath sounds normal. No respiratory distress. She has no wheezes. She has no rhonchi. She  has no rales.  Abdominal: Soft. Normal appearance and bowel sounds are normal. She exhibits no distension, no fluid wave, no abdominal bruit and no mass. There is no hepatosplenomegaly. There is no tenderness. There is no rebound, no guarding and no CVA tenderness. No hernia.  Genitourinary: Vagina normal and uterus normal. No breast swelling,  tenderness, discharge or bleeding. Pelvic exam was performed with patient supine. There is no rash, tenderness or lesion on the right labia. There is no rash, tenderness or lesion on the left labia. Uterus is not enlarged and not tender. Cervix exhibits no motion tenderness, no discharge and no friability. Right adnexum displays no mass, no tenderness and no fullness. Left adnexum displays no mass, no tenderness and no fullness.  Lymphadenopathy:    She has no cervical adenopathy.    She has no axillary adenopathy.  Neurological: She is alert. She has normal strength. No cranial nerve deficit or sensory deficit.  Skin: Skin is warm, dry and intact. No rash noted.  Psychiatric: Her speech is normal and behavior is normal. Judgment normal. Her mood appears not anxious. Cognition and memory are normal. She does not exhibit a depressed mood.     Diabetic foot exam: Normal inspection No skin breakdown No calluses  Normal DP pulses Normal sensation to light touch and monofilament Nails normal      Assessment & Plan:  The patient's preventative maintenance and recommended screening tests for an annual wellness exam were reviewed in full today. Brought up to date unless services declined.  Counselled on the importance of diet, exercise, and its role in overall health and mortality. The patient's FH and SH was reviewed, including their home life, tobacco status, and drug and alcohol status.   Last PAP 07/2012, has always had normal,  Due this year with DVE, then can consider stopping. She has had mammogram  ( nml) and DEXA  (mild osteopenia) 12/15 Vaccines: Uptodate,   Colon: 06/2009 Pearletha Furl , repeat in 10 years. [name]

## 2014-10-04 NOTE — Assessment & Plan Note (Signed)
Well controlled. Continue current medication.  

## 2014-10-04 NOTE — Patient Instructions (Signed)
Keep up great work for healthy eating and regular exercise.

## 2014-10-06 LAB — CYTOLOGY - PAP

## 2014-11-14 DIAGNOSIS — L661 Lichen planopilaris: Secondary | ICD-10-CM | POA: Diagnosis not present

## 2014-11-14 DIAGNOSIS — L718 Other rosacea: Secondary | ICD-10-CM | POA: Diagnosis not present

## 2014-12-19 DIAGNOSIS — L309 Dermatitis, unspecified: Secondary | ICD-10-CM | POA: Diagnosis not present

## 2014-12-19 DIAGNOSIS — L299 Pruritus, unspecified: Secondary | ICD-10-CM | POA: Diagnosis not present

## 2014-12-19 DIAGNOSIS — L859 Epidermal thickening, unspecified: Secondary | ICD-10-CM | POA: Diagnosis not present

## 2015-01-27 ENCOUNTER — Other Ambulatory Visit: Payer: Self-pay | Admitting: Family Medicine

## 2015-02-06 ENCOUNTER — Ambulatory Visit
Admission: RE | Admit: 2015-02-06 | Discharge: 2015-02-06 | Disposition: A | Discharge status: Home / Self Care | Payer: Medicare Other | Source: Ambulatory Visit | Attending: Family Medicine | Admitting: Family Medicine

## 2015-02-06 ENCOUNTER — Ambulatory Visit (INDEPENDENT_AMBULATORY_CARE_PROVIDER_SITE_OTHER): Payer: Medicare Other | Admitting: Family Medicine

## 2015-02-06 ENCOUNTER — Telehealth: Payer: Self-pay

## 2015-02-06 ENCOUNTER — Encounter: Payer: Self-pay | Admitting: Family Medicine

## 2015-02-06 VITALS — BP 140/70 | HR 71 | Temp 97.4°F | Ht 62.25 in | Wt 167.5 lb

## 2015-02-06 DIAGNOSIS — R609 Edema, unspecified: Secondary | ICD-10-CM | POA: Diagnosis not present

## 2015-02-06 DIAGNOSIS — M25571 Pain in right ankle and joints of right foot: Secondary | ICD-10-CM

## 2015-02-06 DIAGNOSIS — Z86718 Personal history of other venous thrombosis and embolism: Secondary | ICD-10-CM | POA: Insufficient documentation

## 2015-02-06 NOTE — Progress Notes (Signed)
Dr. Frederico Hamman T. Tynan Boesel, MD, [facility] [place], [address] Phone: [phone] Fax: [phone]  02/06/2015  Patient: Katie [name], MRN: [id number], DOB: 29-Aug-1945, 69 y.o.  Primary Physician:  Eliezer Lofts, MD  Chief Complaint: Ankle Pain  Subjective:   Katie [name] is a 69 y.o. very pleasant female patient who presents with the following:  R medial ankle. ? Had a blood clot - went to [place]. Drove to [place].  Burning pain.   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  She had a long drive from [place] to [place], and subsequently took a very to [place].  Return drive as well.  Past Medical History, Surgical History, Social History, Family History, Problem List, Medications, and Allergies have been reviewed and updated if relevant.  Patient Active Problem List   Diagnosis Date Noted  . Osteopenia 10/04/2014  . Counseling regarding end of life decision making 10/04/2014  . 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 History of duodenal ulcer 08/02/2014  . Seasonal allergies 08/02/2014  . Essential hypertension, benign 08/02/2014  . High cholesterol 08/02/2014  . 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 Diabetes mellitus without complication 76/72/0947    Past Medical History  Diagnosis Date  . Hypertension   . DJD (degenerative joint disease)   . DM type 2 (diabetes mellitus, type 2)   . Hypercholesteremia   . Lichen planopilaris   . Fibrocystic breast disease   . Anemia   . History of chicken pox   . GERD (gastroesophageal reflux disease)   . Allergy   . Blood transfusion without reported diagnosis   . Urinary incontinence     Past Surgical History  Procedure Laterality Date  . Placement of breast implants  12/26/2004    Replace Failed Implants  . Dilation and curettage of uterus  01/1994  . Finger surgery   2001 & 2003    Trigger Finger  . Joint replacement  04/19/2013    Hip Replacement-Right  . Breast surgery  02/1975    Left Breast Biopsy-Fibrocystic Disease  . Rhinoplasty  03/1964    To Correct Deviated Septum  . Foot surgery  1980    To Relieve Pinched Nerve  . Breast surgery  10/1981    Bialteral Capsulectomy Breast Surgery  . Breast surgery  07/1998    Replace Bilateral Silicone Implants  . Foot surgery  07/2008    Left Foot-Plantar Fasciitis  . Breast surgery  01/1976    Bilater breast surgery to remove fibrocystic tissue and replace with silicone implants    History   Social History  . Marital Status: Married    Spouse Name: N/A  . Number of Children: 1  . Years of Education: N/A   Occupational History  . Retired    Social History Main Topics  . Smoking status: Never Smoker   . Smokeless tobacco: Never Used  . Alcohol Use: No  . Drug Use: No  . Sexual Activity: No   Other Topics Concern  . Not on file   Social History Narrative   Married for 37 years.    She has one child and one stepston.          Family History  Problem Relation Age of Onset  . Arthritis Mother   . Hypertension Mother   . Heart disease Father   .  Diabetes Father   . Breast cancer Sister   . Hypertension Brother   . Leukemia Brother   . Coronary artery disease Brother     Allergies  Allergen Reactions  . Ciprofloxacin     REACTION: rash  . Daypro [Oxaprozin]   . Erythromycin     REACTION: Rash, hives  . Nsaids   . Vioxx [Rofecoxib]     Medication list reviewed and updated in full in [place].  GEN: no acute illness or fever CV: No chest pain or shortness of breath MSK: detailed above Neuro: neurological signs are described above ROS O/w per HPI  Objective:   BP 140/70 mmHg  Pulse 71  Temp(Src) 97.4 F (36.3 C) (Oral)  Ht 5' 2.25" (1.581 m)  Wt 167 lb 8 oz (75.978 kg)  BMI 30.40 kg/m2   GEN: WDWN, NAD, Non-toxic, Alert & Oriented x 3 HEENT:  Atraumatic, Normocephalic.  Ears and Nose: No external deformity. EXTR: No clubbing/cyanosis/edema NEURO: Normal gait.  PSYCH: Normally interactive. Conversant. Not depressed or anxious appearing.  Calm demeanor.    Homan's negative B TTP mildly laterally on R, not along any particular tendon  Radiology: US Venous Img Lower Unilateral Right  02/06/2015   CLINICAL DATA:  Right ankle pain and and edema.  DVT 3 years ago.  EXAM: RIGHT LOWER EXTREMITY VENOUS DUPLEX ULTRASOUND  TECHNIQUE: Doppler venous assessment of the left lower extremity deep venous system was performed, including characterization of spectral flow, compressibility, and phasicity.  COMPARISON:  None.  FINDINGS: There is complete compressibility of the right common femoral, femoral, and popliteal veins. Doppler analysis demonstrates respiratory phasicity and augmentation of flow upon calf compression. No evidence of calf vein DVT. Imaging over the location of pain in the ankle demonstrates no obvious soft tissue mass or abnormality.  IMPRESSION: No evidence of right lower extremity DVT.   Electronically Signed   By: Marybelle Killings M.D.   On: 02/06/2015 15:10     Assessment and Plan:   Right ankle pain - Plan: US Venous Img Lower Unilateral Right, CANCELED: LE VENOUS  Edema - Plan: US Venous Img Lower Unilateral Right, CANCELED: LE VENOUS  Reassuring.  No DVT.  If this is musculoskeletal, it is certainly something quite mild.  Recommended basic NSAIDs for now.  Follow-up: No Follow-up on file.  New Prescriptions   No medications on file   Orders Placed This Encounter  Procedures  . US Venous Img Lower Unilateral Right    Signed,  Lathen Seal T. Krystan Northrop, MD   Patient's Medications  New Prescriptions   No medications on file  Previous Medications   ATORVASTATIN (LIPITOR) 10 MG TABLET    take 1 tablet by mouth once daily   AZELASTINE (ASTELIN) 137 MCG/SPRAY NASAL SPRAY    Place 1 spray into the nose 2 (two) times daily.  Use in each nostril as directed   CALCIUM-VITAMIN D-VITAMIN K (VIACTIV) 932-355-73 MG-UNT-MCG CHEW    Chew 1 each by mouth daily.   CLOBETASOL (TEMOVATE) 0.05 % EXTERNAL SOLUTION       CLOCORTOLONE PIVALATE (CLODERM) 0.1 % CREAM    Apply 1 application topically 2 (two) times daily.   FLUTICASONE (FLONASE) 50 MCG/ACT NASAL SPRAY    Place 2 sprays into the nose daily.   GLUCOSE BLOOD (FREESTYLE LITE) TEST STRIP    Use to check blood sugar daily.  Dx: E11.9   HYDROCHLOROTHIAZIDE (HYDRODIURIL) 25 MG TABLET    take 1 tablet by mouth once daily  LORATADINE (CLARITIN) 10 MG TABLET    Take 10 mg by mouth daily.   LOSARTAN (COZAAR) 100 MG TABLET    take 1 tablet by mouth once daily   METFORMIN (GLUCOPHAGE) 500 MG TABLET    take 1 tablet by mouth once daily   METRONIDAZOLE (METROCREAM) 0.75 % CREAM    Apply 1 application topically 2 (two) times daily.  Modified Medications   No medications on file  Discontinued Medications   TRIAMCINOLONE CREAM (KENALOG) 0.5 %    Apply 1 application topically 2 (two) times daily.

## 2015-02-06 NOTE — Progress Notes (Signed)
Pre visit review using our clinic review tool, if applicable. No additional management support is needed unless otherwise documented below in the visit note. 

## 2015-02-06 NOTE — Patient Instructions (Signed)

## 2015-02-06 NOTE — Telephone Encounter (Addendum)
Katie [name] with [facility] [place] called report [place] rt leg negative for blood clot. Pt waiting. Dr Lorelei Pont said inform pt neg for blood clot and pt can go. Katie [name] voiced understanding and will let pt know.

## 2015-03-20 ENCOUNTER — Encounter: Payer: Self-pay | Admitting: Gastroenterology

## 2015-03-23 ENCOUNTER — Telehealth: Payer: Self-pay | Admitting: Family Medicine

## 2015-03-23 DIAGNOSIS — E119 Type 2 diabetes mellitus without complications: Secondary | ICD-10-CM

## 2015-03-23 DIAGNOSIS — E7800 Pure hypercholesterolemia, unspecified: Secondary | ICD-10-CM

## 2015-03-23 NOTE — Telephone Encounter (Signed)
-----   Message from Ellamae Sia sent at 03/22/2015  6:14 PM EDT ----- Regarding: Lab orders for Tuesday, 8.2.16 Lab orders for a 6 month follow up appt

## 2015-03-28 ENCOUNTER — Other Ambulatory Visit (INDEPENDENT_AMBULATORY_CARE_PROVIDER_SITE_OTHER): Payer: Medicare Other

## 2015-03-28 DIAGNOSIS — E7800 Pure hypercholesterolemia, unspecified: Secondary | ICD-10-CM

## 2015-03-28 DIAGNOSIS — E119 Type 2 diabetes mellitus without complications: Secondary | ICD-10-CM | POA: Diagnosis not present

## 2015-03-28 LAB — LDL CHOLESTEROL, DIRECT: Direct LDL: 87 mg/dL

## 2015-03-28 LAB — COMPREHENSIVE METABOLIC PANEL
ALT: 18 U/L (ref 0–35)
AST: 19 U/L (ref 0–37)
Albumin: 4.2 g/dL (ref 3.5–5.2)
Alkaline Phosphatase: 72 U/L (ref 39–117)
BUN: 20 mg/dL (ref 6–23)
CHLORIDE: 99 meq/L (ref 96–112)
CO2: 29 meq/L (ref 19–32)
Calcium: 9.4 mg/dL (ref 8.4–10.5)
Creatinine, Ser: 0.9 mg/dL (ref 0.40–1.20)
GFR: 65.73 mL/min (ref >60.00)
GLUCOSE: 122 mg/dL — AB (ref 70–99)
POTASSIUM: 4.1 meq/L (ref 3.5–5.1)
SODIUM: 135 meq/L (ref 135–145)
Total Bilirubin: 0.4 mg/dL (ref 0.2–1.2)
Total Protein: 7.1 g/dL (ref 6.0–8.3)

## 2015-03-28 LAB — LIPID PANEL
CHOL/HDL RATIO: 5
CHOLESTEROL: 165 mg/dL (ref 0–200)
HDL: 36 mg/dL — ABNORMAL LOW (ref >39.00)
NONHDL: 129.28
TRIGLYCERIDES: 277 mg/dL — AB (ref 0.0–149.0)
VLDL: 55.4 mg/dL — ABNORMAL HIGH (ref 0.0–40.0)

## 2015-03-28 LAB — HEMOGLOBIN A1C: Hgb A1c MFr Bld: 5.9 % (ref 4.6–6.5)

## 2015-04-04 ENCOUNTER — Encounter: Payer: Self-pay | Admitting: Family Medicine

## 2015-04-04 ENCOUNTER — Ambulatory Visit (INDEPENDENT_AMBULATORY_CARE_PROVIDER_SITE_OTHER): Payer: Medicare Other | Admitting: Family Medicine

## 2015-04-04 VITALS — BP 130/70 | HR 72 | Temp 97.7°F | Wt 169.5 lb

## 2015-04-04 DIAGNOSIS — I10 Essential (primary) hypertension: Secondary | ICD-10-CM | POA: Diagnosis not present

## 2015-04-04 DIAGNOSIS — Z8349 Family history of other endocrine, nutritional and metabolic diseases: Secondary | ICD-10-CM | POA: Diagnosis not present

## 2015-04-04 DIAGNOSIS — E7800 Pure hypercholesterolemia, unspecified: Secondary | ICD-10-CM

## 2015-04-04 DIAGNOSIS — E119 Type 2 diabetes mellitus without complications: Secondary | ICD-10-CM | POA: Diagnosis not present

## 2015-04-04 LAB — TSH: TSH: 2.7 u[IU]/mL (ref 0.35–4.50)

## 2015-04-04 LAB — HM DIABETES FOOT EXAM

## 2015-04-04 NOTE — Assessment & Plan Note (Signed)
Given this as well as autoimmune skin disease and DM .[name] Will eval for thyroid issue.

## 2015-04-04 NOTE — Assessment & Plan Note (Signed)
Well controlled. Continue current medication. Encouraged exercise, weight loss, healthy eating habits.  

## 2015-04-04 NOTE — Assessment & Plan Note (Signed)
LDL at goal. Trig high and HDL low. Increase exercise and veggie fats.

## 2015-04-04 NOTE — Progress Notes (Signed)
Pre visit review using our clinic review tool, if applicable. No additional management support is needed unless otherwise documented below in the visit note. 
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
## 2015-04-04 NOTE — Assessment & Plan Note (Signed)
Well controlled. Continue current medication.  

## 2015-04-04 NOTE — Patient Instructions (Addendum)
Increase exercise as able.  Keep up the good work on healthy eating. Stop at lab on way out for thyroid test.

## 2015-04-13 DIAGNOSIS — Z23 Encounter for immunization: Secondary | ICD-10-CM | POA: Diagnosis not present

## 2015-05-26 DIAGNOSIS — D1801 Hemangioma of skin and subcutaneous tissue: Secondary | ICD-10-CM | POA: Diagnosis not present

## 2015-05-26 DIAGNOSIS — L820 Inflamed seborrheic keratosis: Secondary | ICD-10-CM | POA: Diagnosis not present

## 2015-05-26 DIAGNOSIS — L661 Lichen planopilaris: Secondary | ICD-10-CM | POA: Diagnosis not present

## 2015-05-26 DIAGNOSIS — L298 Other pruritus: Secondary | ICD-10-CM | POA: Diagnosis not present

## 2015-05-26 DIAGNOSIS — L538 Other specified erythematous conditions: Secondary | ICD-10-CM | POA: Diagnosis not present

## 2015-05-29 DIAGNOSIS — L661 Lichen planopilaris: Secondary | ICD-10-CM | POA: Diagnosis not present

## 2015-05-29 DIAGNOSIS — Z5181 Encounter for therapeutic drug level monitoring: Secondary | ICD-10-CM | POA: Diagnosis not present

## 2015-07-19 ENCOUNTER — Other Ambulatory Visit: Payer: Self-pay | Admitting: Family Medicine

## 2015-08-02 DIAGNOSIS — Z803 Family history of malignant neoplasm of breast: Secondary | ICD-10-CM | POA: Diagnosis not present

## 2015-08-02 DIAGNOSIS — Z1231 Encounter for screening mammogram for malignant neoplasm of breast: Secondary | ICD-10-CM | POA: Diagnosis not present

## 2015-08-03 ENCOUNTER — Encounter: Payer: Self-pay | Admitting: Family Medicine

## 2015-08-16 ENCOUNTER — Encounter: Payer: Self-pay | Admitting: Primary Care

## 2015-08-16 ENCOUNTER — Ambulatory Visit (INDEPENDENT_AMBULATORY_CARE_PROVIDER_SITE_OTHER): Payer: Medicare Other | Admitting: Primary Care

## 2015-08-16 VITALS — BP 136/76 | HR 75 | Temp 98.5°F | Ht 62.25 in | Wt 172.4 lb

## 2015-08-16 DIAGNOSIS — R1032 Left lower quadrant pain: Secondary | ICD-10-CM | POA: Diagnosis not present

## 2015-08-16 MED ORDER — AMOXICILLIN-POT CLAVULANATE 875-125 MG PO TABS: 1.0000 | ORAL_TABLET | Freq: Two times a day (BID) | ORAL | Status: DC

## 2015-08-16 NOTE — Progress Notes (Signed)
Pre visit review using our clinic review tool, if applicable. No additional management support is needed unless otherwise documented below in the visit note. 

## 2015-08-16 NOTE — Patient Instructions (Signed)
Start Augmentin antibiotics. Take 1 tablet by mouth twice daily for 7 days.  Stop eating nuts and popcorn. Start eating a high fiber diet such as fruits and vegetables.  Increase consumption of water.   If you develop fevers, increased abdominal pain, vomiting while on the antibiotics, please go to the emergency department.  It was a pleasure meeting you!

## 2015-08-16 NOTE — Progress Notes (Signed)
Subjective:    Patient ID: Katie [name], female    DOB: [birth year], 69 y.o.   MRN: [id number]  HPI  Katie [name] is a 69 year old female who presents today with a chief complaint of abdominal pain. Her pain is located to the LLQ. She reports pressure, pain with bowel movements. Her pain has been present for 3-4 days and is continuously becoming worse. This morning her pain was at its highest level. Denies nausea, fevers, bloody stools, mucous in stools.  She has a history of diverticulitis 3 years ago and her symptoms feel the same. She was diagnosed 3 years ago at [facility]. She's recently been eating mass amounts of popcorn and nuts since Holidays began.  Review of Systems  Constitutional: Positive for chills and fatigue. Negative for fever.  Respiratory: Negative for shortness of breath.   Cardiovascular: Negative for chest pain.  Gastrointestinal: Positive for abdominal pain. Negative for nausea, vomiting, diarrhea, constipation and blood in stool.       Past Medical History  Diagnosis Date  . Hypertension   . DJD (degenerative joint disease)   . DM type 2 (diabetes mellitus, type 2) ([place])   . Hypercholesteremia   . Lichen planopilaris   . Fibrocystic breast disease   . Anemia   . History of chicken pox   . GERD (gastroesophageal reflux disease)   . Allergy   . Blood transfusion without reported diagnosis   . Urinary incontinence     Social History   Social History  . Marital Status: Married    Spouse Name: N/A  . Number of Children: 1  . Years of Education: N/A   Occupational History  . Retired    Social History Main Topics  . Smoking status: Never Smoker   . Smokeless tobacco: Never Used  . Alcohol Use: No  . Drug Use: No  . Sexual Activity: No   Other Topics Concern  . Not on file   Social History Narrative   Married for 37 years.    She has one child and one stepston.          Past Surgical History  Procedure Laterality Date  . Placement  of breast implants  12/26/2004    Replace Failed Implants  . Dilation and curettage of uterus  01/1994  . Finger surgery  2001 & 2003    Trigger Finger  . Joint replacement  04/19/2013    Hip Replacement-Right  . Breast surgery  02/1975    Left Breast Biopsy-Fibrocystic Disease  . Rhinoplasty  03/1964    To Correct Deviated Septum  . Foot surgery  1980    To Relieve Pinched Nerve  . Breast surgery  10/1981    Bialteral Capsulectomy Breast Surgery  . Breast surgery  07/1998    Replace Bilateral Silicone Implants  . Foot surgery  07/2008    Left Foot-Plantar Fasciitis  . Breast surgery  01/1976    Bilater breast surgery to remove fibrocystic tissue and replace with silicone implants    Family History  Problem Relation Age of Onset  . Arthritis Mother   . Hypertension Mother   . Heart disease Father   . Diabetes Father   . Breast cancer Sister   . Hypertension Brother   . Leukemia Brother   . Coronary artery disease Brother     Allergies  Allergen Reactions  . Ciprofloxacin     REACTION: rash  . Daypro [Oxaprozin]   . Erythromycin  REACTION: Rash, hives  . Nsaids   . Vioxx [Rofecoxib]     Current Outpatient Prescriptions on File Prior to Visit  Medication Sig Dispense Refill  . atorvastatin (LIPITOR) 10 MG tablet take 1 tablet by mouth once daily 30 tablet 2  . azelastine (ASTELIN) 137 MCG/SPRAY nasal spray Place 1 spray into the nose 2 (two) times daily. Use in each nostril as directed    . Calcium-Vitamin D-Vitamin K (VIACTIV) [id number] MG-UNT-MCG CHEW Chew 1 each by mouth daily.    [name] Clocortolone Pivalate (CLODERM) 0.1 % cream Apply 1 application topically 2 (two) times daily.    . fluticasone (FLONASE) 50 MCG/ACT nasal spray Place 2 sprays into the nose daily.    [name] glucose blood (FREESTYLE LITE) test strip Use to check blood sugar daily.  Dx: E11.9 100 each 3  . hydrochlorothiazide (HYDRODIURIL) 25 MG tablet take 1 tablet by mouth once daily 30 tablet 2  .  loratadine (CLARITIN) 10 MG tablet Take 10 mg by mouth daily.    [name] losartan (COZAAR) 100 MG tablet take 1 tablet by mouth once daily 30 tablet 2  . metFORMIN (GLUCOPHAGE) 500 MG tablet take 1 tablet by mouth once daily 30 tablet 2  . clobetasol (TEMOVATE) 0.05 % external solution Apply 1 application topically. Reported on 08/16/2015    . metroNIDAZOLE (METROCREAM) 0.75 % cream Apply 1 application topically 2 (two) times daily. Reported on 08/16/2015     No current facility-administered medications on file prior to visit.    BP 136/76 mmHg  Pulse 75  Temp(Src) 98.5 F (36.9 C) (Oral)  Ht 5' 2.25" (1.581 m)  Wt 172 lb 6.4 oz (78.2 kg)  BMI 31.29 kg/m2  SpO2 95%    Objective:   Physical Exam  Constitutional: She appears well-nourished.  Cardiovascular: Normal rate and regular rhythm.   Pulmonary/Chest: Effort normal and breath sounds normal.  Abdominal: Soft. Normal appearance and bowel sounds are normal. There is tenderness in the left lower quadrant. There is no rebound, no tenderness at McBurney's point and negative Murphy's sign.  Skin: Skin is warm and dry.          Assessment & Plan:  Abdominal Pain:  Located to left lower quadrant. History of diverticulitis and this pain feels the same. Pain x 3 days, worse today with fatigue. Denies fevers.  Increased intake of nuts and popcorn for the past several weeks. Exam with moderate tenderness to LLQ, otherwise unremarkable. Due to history, symptoms, exam, and increased fatigue will treat empirically for diverticulitis. Suspect this due to examination. Start Augmentin course x 7 days as she is allergic to cipro. Discussed diet and increase in water intake.  ER precautions provided.

## 2015-08-17 DIAGNOSIS — L661 Lichen planopilaris: Secondary | ICD-10-CM | POA: Diagnosis not present

## 2015-08-18 DIAGNOSIS — E119 Type 2 diabetes mellitus without complications: Secondary | ICD-10-CM | POA: Diagnosis not present

## 2015-08-18 LAB — HM DIABETES EYE EXAM

## 2015-08-22 ENCOUNTER — Encounter: Payer: Self-pay | Admitting: Family Medicine

## 2015-09-05 DIAGNOSIS — M65311 Trigger thumb, right thumb: Secondary | ICD-10-CM | POA: Diagnosis not present

## 2015-09-05 DIAGNOSIS — M65322 Trigger finger, left index finger: Secondary | ICD-10-CM | POA: Diagnosis not present

## 2015-10-02 ENCOUNTER — Telehealth: Payer: Self-pay | Admitting: Family Medicine

## 2015-10-02 DIAGNOSIS — Z8349 Family history of other endocrine, nutritional and metabolic diseases: Secondary | ICD-10-CM

## 2015-10-02 DIAGNOSIS — M8580 Other specified disorders of bone density and structure, unspecified site: Secondary | ICD-10-CM

## 2015-10-02 DIAGNOSIS — E119 Type 2 diabetes mellitus without complications: Secondary | ICD-10-CM

## 2015-10-02 DIAGNOSIS — Z1159 Encounter for screening for other viral diseases: Secondary | ICD-10-CM

## 2015-10-02 NOTE — Telephone Encounter (Signed)
-----   Message from Ellamae Sia sent at 09/25/2015 10:27 AM EST ----- Regarding: Lab orders for Tuesday, 2.7.17 Patient is scheduled for CPX labs, please order future labs, Thanks , Karna Christmas

## 2015-10-03 ENCOUNTER — Other Ambulatory Visit (INDEPENDENT_AMBULATORY_CARE_PROVIDER_SITE_OTHER): Payer: Medicare Other

## 2015-10-03 ENCOUNTER — Other Ambulatory Visit: Payer: Medicare Other

## 2015-10-03 ENCOUNTER — Other Ambulatory Visit: Payer: Self-pay | Admitting: Family Medicine

## 2015-10-03 DIAGNOSIS — Z1159 Encounter for screening for other viral diseases: Secondary | ICD-10-CM

## 2015-10-03 DIAGNOSIS — M8580 Other specified disorders of bone density and structure, unspecified site: Secondary | ICD-10-CM

## 2015-10-03 DIAGNOSIS — E119 Type 2 diabetes mellitus without complications: Secondary | ICD-10-CM

## 2015-10-03 LAB — COMPREHENSIVE METABOLIC PANEL
ALBUMIN: 4.2 g/dL (ref 3.5–5.2)
ALT: 18 U/L (ref 0–35)
AST: 16 U/L (ref 0–37)
Alkaline Phosphatase: 75 U/L (ref 39–117)
BUN: 19 mg/dL (ref 6–23)
CALCIUM: 9.7 mg/dL (ref 8.4–10.5)
CHLORIDE: 100 meq/L (ref 96–112)
CO2: 30 mEq/L (ref 19–32)
CREATININE: 0.85 mg/dL (ref 0.40–1.20)
GFR: 70.11 mL/min (ref >60.00)
Glucose, Bld: 124 mg/dL — ABNORMAL HIGH (ref 70–99)
POTASSIUM: 4.5 meq/L (ref 3.5–5.1)
Sodium: 135 mEq/L (ref 135–145)
Total Bilirubin: 0.6 mg/dL (ref 0.2–1.2)
Total Protein: 7.2 g/dL (ref 6.0–8.3)

## 2015-10-03 LAB — LIPID PANEL
CHOLESTEROL: 158 mg/dL (ref 0–200)
HDL: 41.2 mg/dL (ref >39.00)
LDL CALC: 77 mg/dL (ref 0–99)
NonHDL: 116.3
TRIGLYCERIDES: 199 mg/dL — AB (ref 0.0–149.0)
Total CHOL/HDL Ratio: 4
VLDL: 39.8 mg/dL (ref 0.0–40.0)

## 2015-10-03 LAB — HEMOGLOBIN A1C: HEMOGLOBIN A1C: 6.3 % (ref 4.6–6.5)

## 2015-10-03 LAB — VITAMIN D 25 HYDROXY (VIT D DEFICIENCY, FRACTURES): VITD: 27.83 ng/mL — ABNORMAL LOW (ref 30.00–100.00)

## 2015-10-04 LAB — HEPATITIS C ANTIBODY: HCV Ab: NEGATIVE

## 2015-10-06 ENCOUNTER — Ambulatory Visit (INDEPENDENT_AMBULATORY_CARE_PROVIDER_SITE_OTHER): Payer: Medicare Other | Admitting: Family Medicine

## 2015-10-06 ENCOUNTER — Encounter: Payer: Self-pay | Admitting: Family Medicine

## 2015-10-06 VITALS — BP 140/70 | HR 73 | Temp 97.4°F | Ht 62.25 in | Wt 171.0 lb

## 2015-10-06 DIAGNOSIS — Z0000 Encounter for general adult medical examination without abnormal findings: Secondary | ICD-10-CM

## 2015-10-06 DIAGNOSIS — I10 Essential (primary) hypertension: Secondary | ICD-10-CM | POA: Diagnosis not present

## 2015-10-06 DIAGNOSIS — E7800 Pure hypercholesterolemia, unspecified: Secondary | ICD-10-CM

## 2015-10-06 DIAGNOSIS — Z23 Encounter for immunization: Secondary | ICD-10-CM

## 2015-10-06 DIAGNOSIS — E119 Type 2 diabetes mellitus without complications: Secondary | ICD-10-CM | POA: Diagnosis not present

## 2015-10-06 LAB — HM DIABETES FOOT EXAM

## 2015-10-06 MED ORDER — METFORMIN HCL 500 MG PO TABS: 500.0000 mg | ORAL_TABLET | Freq: Every day | ORAL | Status: DC

## 2015-10-06 MED ORDER — HYDROCHLOROTHIAZIDE 25 MG PO TABS: 25.0000 mg | ORAL_TABLET | Freq: Every day | ORAL | Status: DC

## 2015-10-06 MED ORDER — ONETOUCH ULTRA 2 W/DEVICE KIT: PACK | Status: DC

## 2015-10-06 MED ORDER — LOSARTAN POTASSIUM 100 MG PO TABS: 100.0000 mg | ORAL_TABLET | Freq: Every day | ORAL | Status: DC

## 2015-10-06 MED ORDER — GLUCOSE BLOOD VI STRP: ORAL_STRIP | Status: DC

## 2015-10-06 MED ORDER — ATORVASTATIN CALCIUM 10 MG PO TABS: 10.0000 mg | ORAL_TABLET | Freq: Every day | ORAL | Status: DC

## 2015-10-06 MED ORDER — ONETOUCH ULTRASOFT LANCETS MISC: Status: DC

## 2015-10-06 MED ORDER — FLUTICASONE PROPIONATE 50 MCG/ACT NA SUSP: 2.0000 | Freq: Every day | NASAL | Status: DC

## 2015-10-06 NOTE — Assessment & Plan Note (Signed)
Well controlled. Continue current medication. Tris slightly high, work on low fat low carb diet.

## 2015-10-06 NOTE — Addendum Note (Signed)
Addended by: Carter Kitten on: 10/06/2015 10:57 AM   Modules accepted: Orders

## 2015-10-06 NOTE — Assessment & Plan Note (Signed)
Well controlled. Continue current medication.  

## 2015-10-06 NOTE — Assessment & Plan Note (Signed)
Has HCPOA ( husband Rosalinde Malinowski), living will, full code (reviewed 2017)

## 2015-10-06 NOTE — Assessment & Plan Note (Signed)
Well controlled. Continue current medication. Encouraged exercise, weight loss, healthy eating habits.  

## 2015-10-06 NOTE — Patient Instructions (Addendum)
Work on exercise and low fat low carb diet.  Try  Vit D3 400 IU twice daily

## 2015-10-06 NOTE — Progress Notes (Signed)
Pre visit review using our clinic review tool, if applicable. No additional management support is needed unless otherwise documented below in the visit note. 

## 2015-10-06 NOTE — Progress Notes (Signed)
I have personally reviewed the Medicare Annual Wellness questionnaire and have noted 1. The patient's medical and social history 2. Their use of alcohol, tobacco or illicit drugs 3. Their current medications and supplements 4. The patient's functional ability including ADL's, fall risks, home safety risks and hearing or visual             impairment. 5. Diet and physical activities 6. Evidence for depression or mood disorders 7.         Updated provider list Cognitive evaluation was performed and recorded on pt medicare questionnaire form. The patients weight, height, BMI and visual acuity have been recorded in the chart  I have made referrals, counseling and provided education to the patient based review of the above and I have provided the pt with a written personalized care plan for preventive services.   Seeing Derm for lichen plano-pilaris. Started on plaquenil. Followed by optho.  Some mild SE of stomach upset. She has not yet noted any improvement in hair loss.  Diabetes: Well controlled on metformin 500 mg daily. She has SE to actos, off now.  Lab Results  Component Value Date   HGBA1C 6.3 10/03/2015  Using medications without difficulties:None Hypoglycemic episodes:None Hyperglycemic episodes:None Feet problems:None Blood Sugars averaging: FBS 115-120, 2 hours after meals 130-160. eye exam within last year: 07/2015.  Elevated Cholesterol: At goal LDL < 100 on lipitor 10 mg. Trig elevated. But better. Lab Results  Component Value Date   CHOL 158 10/03/2015   HDL 41.20 10/03/2015   LDLCALC 77 10/03/2015   LDLDIRECT 87.0 03/28/2015   TRIG 199.0* 10/03/2015   CHOLHDL 4 10/03/2015  Using medications without problems:None Muscle aches: None She exercises 2-3 times a week.  Diet: low carb diet, weight watchers.  Hypertension:  Well controlled on losartanand HCTZ. BP Readings from Last 3 Encounters:  10/06/15 140/70  08/16/15 136/76  04/04/15 130/70  Using  medication without problems or lightheadedness: None Chest pain with exertion:None Edema: None Short of breath:None Average home BPs: [id number] Other issues:  Wt Readings from Last 3 Encounters:  10/06/15 171 lb (77.565 kg)  08/16/15 172 lb 6.4 oz (78.2 kg)  04/04/15 169 lb 8 oz (76.885 kg)   Low vit D: taking vit D in calcium supplement but missing a lot.  Social History /Family History/Past Medical History reviewed and updated if needed.   Review of Systems  Constitutional: Negative for fever and fatigue.  HENT: Negative for ear pain.  Eyes: Negative for pain.  Respiratory: Negative for chest tightness and shortness of breath.  Cardiovascular: Negative for chest pain, palpitations and leg swelling.  Gastrointestinal: Negative for abdominal pain.  Genitourinary: Negative for dysuria, urgency, frequency, hematuria and flank pain.     Objective:   Physical Exam  Constitutional: Vital signs are normal. She appears well-developed and well-nourished. She is cooperative. Non-toxic appearance. She does not appear ill. No distress.  HENT:  Head: Normocephalic.  Right Ear: Hearing, tympanic membrane, external ear and ear canal normal.  Left Ear: Hearing, tympanic membrane, external ear and ear canal normal.  Nose: Nose normal.  Eyes: Conjunctivae, EOM and lids are normal. Pupils are equal, round, and reactive to light. Lids are everted and swept, no foreign bodies found.  Neck: Trachea normal and normal range of motion. Neck supple. Carotid bruit is not present. No thyroid mass and no thyromegaly present.  Cardiovascular: Normal rate, regular rhythm, S1 normal, S2 normal, normal heart sounds and intact distal pulses. Exam reveals no gallop.  No murmur heard. Pulmonary/Chest: Effort normal and breath sounds normal. No respiratory distress. She has no wheezes. She has no rhonchi. She has no rales.  Abdominal: Soft. Normal appearance and bowel sounds are normal. She  exhibits no distension, no fluid wave, no abdominal bruit and no mass. There is no hepatosplenomegaly. There is no tenderness. There is no rebound, no guarding and no CVA tenderness. No hernia.  Genitourinary: Breast exam: No mass, nodules, thickening, tenderness, bulging, retraction, inflamation, nipple discharge or skin changes noted.  No axillary or clavicular LA.  Chaperoned exam.  Implants in place. Lymphadenopathy:   She has no cervical adenopathy.   She has no axillary adenopathy.  Neurological: She is alert. She has normal strength. No cranial nerve deficit or sensory deficit.  Skin: Skin is warm, dry and intact. No rash noted.  Psychiatric: Her speech is normal and behavior is normal. Judgment normal. Her mood appears not anxious. Cognition and memory are normal. She does not exhibit a depressed mood.     Diabetic foot exam: Normal inspection No skin breakdown No calluses  Normal DP pulses Normal sensation to light touch and monofilament Nails normal      Assessment & Plan:  The patient's preventative maintenance and recommended screening tests for an annual wellness exam were reviewed in full today. Brought up to date unless services declined.  Counselled on the importance of diet, exercise, and its role in overall health and mortality. The patient's FH and SH was reviewed, including their home life, tobacco status, and drug and alcohol status.   Last PAP 09/2014 nml, neg HPV,no further indicated. DVE: asymptomatic, no family history of ovarian or uterine cancer She has had mammogram ( nml) 07/2015 DEXA (mild osteopenia) 12/15 Vaccines: Due for PCV 23 as well as Tdap. Got flu at [facility]. Colon: 06/2009 Pearletha Furl , repeat in 10 years. .Hep C:neg  Nonsmoker

## 2015-10-09 ENCOUNTER — Ambulatory Visit (INDEPENDENT_AMBULATORY_CARE_PROVIDER_SITE_OTHER): Payer: Medicare Other | Admitting: Family Medicine

## 2015-10-09 ENCOUNTER — Encounter: Payer: Self-pay | Admitting: Family Medicine

## 2015-10-09 VITALS — BP 128/72 | HR 75 | Temp 97.4°F | Ht 62.25 in | Wt 169.2 lb

## 2015-10-09 DIAGNOSIS — T8090XA Unspecified complication following infusion and therapeutic injection, initial encounter: Secondary | ICD-10-CM | POA: Diagnosis not present

## 2015-10-09 NOTE — Progress Notes (Signed)
Dr. Frederico Hamman T. Gustav Knueppel, MD, [facility] [place], [address] Phone: [phone] Fax: [phone]  10/09/2015  Patient: Katie [name], MRN: [id number], DOB: 1945/10/20, 70 y.o.  Primary Physician:  Eliezer Lofts, MD   Chief Complaint  Patient presents with  . Vaccine Reaction    Left Arm-Given Pneumovax on 10/06/15   Subjective:   Katie [name] is a 70 y.o. very pleasant female patient who presents with the following:  Immunization History  Administered Date(s) Administered  . Hep A / Hep B 05/29/2009, 07/03/2009, 11/24/2009  . Influenza, High Dose Seasonal PF 04/13/2015  . Influenza-Unspecified 05/17/2009, 04/02/2011, 05/20/2012, 07/01/2013, 04/18/2014  . Pneumococcal Conjugate-13 04/18/2014  . Pneumococcal Polysaccharide-23 10/06/2015  . Td 05/17/2009  . Typhoid Inactivated 05/17/2009  . Yellow Fever 05/17/2009  . Zoster 04/02/2011    L arm.  S/p inj of pneumovax 23 last fri and now with some swelling, redness, and fading bruising, improving   Past Medical History, Surgical History, Social History, Family History, Problem List, Medications, and Allergies have been reviewed and updated if relevant.  Patient Active Problem List   Diagnosis Date Noted  . Family history of thyroid disorder 04/04/2015  . Osteopenia 10/04/2014  . Counseling regarding end of life decision making 10/04/2014  . 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 History of duodenal ulcer 08/02/2014  . Seasonal allergies 08/02/2014  . Essential hypertension, benign 08/02/2014  . High cholesterol 08/02/2014  . 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 Diabetes mellitus without complication ([place]) 48/54/6270    Past Medical History  Diagnosis Date  . Hypertension   . DJD (degenerative joint disease)   . DM type 2 (diabetes mellitus, type 2) ([place])   . Hypercholesteremia   . Lichen planopilaris   . Fibrocystic breast disease   . Anemia   .  History of chicken pox   . GERD (gastroesophageal reflux disease)   . Allergy   . Blood transfusion without reported diagnosis   . Urinary incontinence   . Diverticulitis     Past Surgical History  Procedure Laterality Date  . Placement of breast implants  12/26/2004    Replace Failed Implants  . Dilation and curettage of uterus  01/1994  . Finger surgery  2001 & 2003    Trigger Finger  . Joint replacement  04/19/2013    Hip Replacement-Right  . Breast surgery  02/1975    Left Breast Biopsy-Fibrocystic Disease  . Rhinoplasty  03/1964    To Correct Deviated Septum  . Foot surgery  1980    To Relieve Pinched Nerve  . Breast surgery  10/1981    Bialteral Capsulectomy Breast Surgery  . Breast surgery  07/1998    Replace Bilateral Silicone Implants  . Foot surgery  07/2008    Left Foot-Plantar Fasciitis  . Breast surgery  01/1976    Bilater breast surgery to remove fibrocystic tissue and replace with silicone implants    Social History   Social History  . Marital Status: Married    Spouse Name: N/A  . Number of Children: 1  . Years of Education: N/A   Occupational History  . Retired    Social History Main Topics  . Smoking status: Never Smoker   . Smokeless tobacco: Never Used  . Alcohol Use: No  . Drug Use: No  . Sexual Activity: No   Other Topics Concern  . Not on file   Social History Narrative  Married for 37 years.    She has one child and one stepston.          Family History  Problem Relation Age of Onset  . Arthritis Mother   . Hypertension Mother   . Heart disease Father   . Diabetes Father   . Breast cancer Sister   . Hypertension Brother   . Leukemia Brother   . Coronary artery disease Brother     Allergies  Allergen Reactions  . Ciprofloxacin     REACTION: rash  . Daypro [Oxaprozin]   . Enalapril Maleate Nausea Only  . Erythromycin     REACTION: Rash, hives  . Nsaids   . Vioxx [Rofecoxib]     Medication list reviewed and  updated in full in [facility].   GEN: No acute illnesses, no fevers, chills. GI: No n/v/d, eating normally Pulm: No SOB Interactive and getting along well at home.  Otherwise, ROS is as per the HPI.  Objective:   BP 128/72 mmHg  Pulse 75  Temp(Src) 97.4 F (36.3 C) (Oral)  Ht 5' 2.25" (1.581 m)  Wt 169 lb 4 oz (76.771 kg)  BMI 30.71 kg/m2  GEN: WDWN, NAD, Non-toxic, A & O x 3 HEENT: Atraumatic, Normocephalic. Neck supple. No masses, No LAD. Ears and Nose: No external deformity. CV: RRR, No M/G/R. No JVD. No thrill. No extra heart sounds. PULM: CTA B, no wheezes, crackles, rhonchi. No retractions. No resp. distress. No accessory muscle use. EXTR: No c/c/e NEURO Normal gait.  PSYCH: Normally interactive. Conversant. Not depressed or anxious appearing.  Calm demeanor.   Laboratory and Imaging Data:  Assessment and Plan:   Injection site reaction, initial encounter  L arm reassured, improving Ice, benadryl  Follow-up: No Follow-up on file.  Signed,  Maud Deed. Willi Borowiak, MD   Patient's Medications  New Prescriptions   No medications on file  Previous Medications   ATORVASTATIN (LIPITOR) 10 MG TABLET    Take 1 tablet (10 mg total) by mouth daily.   AZELASTINE (ASTELIN) 137 MCG/SPRAY NASAL SPRAY    Place 1 spray into the nose 2 (two) times daily. Use in each nostril as directed   BLOOD GLUCOSE MONITORING SUPPL (ONE TOUCH ULTRA 2) W/DEVICE KIT    Use to check blood sugar daily.  E11.9   CALCIUM-VITAMIN D-VITAMIN K (VIACTIV) 128-786-76 MG-UNT-MCG CHEW    Chew 1 each by mouth daily.   CLOBETASOL (TEMOVATE) 0.05 % EXTERNAL SOLUTION    Apply 1 application topically. Reported on 08/16/2015   FLUTICASONE (FLONASE) 50 MCG/ACT NASAL SPRAY    Place 2 sprays into both nostrils daily.   GLUCOSE BLOOD TEST STRIP    ONETOUCH ULTRA 2 TEST STRIPS.  Use to check blood sugar daily.  E11.9   HYDROCHLOROTHIAZIDE (HYDRODIURIL) 25 MG TABLET    Take 1 tablet (25 mg total) by mouth  daily.   HYDROXYCHLOROQUINE (PLAQUENIL) 200 MG TABLET    Take 200 mg by mouth 2 (two) times daily.    LANCETS (ONETOUCH ULTRASOFT) LANCETS    Use to check blood sugar daily.  E11.9   LORATADINE (CLARITIN) 10 MG TABLET    Take 10 mg by mouth daily.   LOSARTAN (COZAAR) 100 MG TABLET    Take 1 tablet (100 mg total) by mouth daily.   METFORMIN (GLUCOPHAGE) 500 MG TABLET    Take 1 tablet (500 mg total) by mouth daily.  Modified Medications   No medications on file  Discontinued Medications   No  medications on file

## 2015-10-09 NOTE — Progress Notes (Signed)
Pre visit review using our clinic review tool, if applicable. No additional management support is needed unless otherwise documented below in the visit note. 

## 2015-11-16 DIAGNOSIS — L661 Lichen planopilaris: Secondary | ICD-10-CM | POA: Diagnosis not present

## 2016-02-21 DIAGNOSIS — L661 Lichen planopilaris: Secondary | ICD-10-CM | POA: Diagnosis not present

## 2016-03-29 DIAGNOSIS — M25562 Pain in left knee: Secondary | ICD-10-CM | POA: Insufficient documentation

## 2016-03-31 ENCOUNTER — Telehealth: Payer: Self-pay | Admitting: Family Medicine

## 2016-03-31 DIAGNOSIS — M8580 Other specified disorders of bone density and structure, unspecified site: Secondary | ICD-10-CM

## 2016-03-31 DIAGNOSIS — E119 Type 2 diabetes mellitus without complications: Secondary | ICD-10-CM

## 2016-03-31 DIAGNOSIS — E7800 Pure hypercholesterolemia, unspecified: Secondary | ICD-10-CM

## 2016-03-31 NOTE — Telephone Encounter (Signed)
-----   Message from Ellamae Sia sent at 03/26/2016 11:24 AM EDT ----- Regarding: Lab orders for Monday,8.7.17 Lab orders for a 6 month follow up appt

## 2016-04-01 ENCOUNTER — Other Ambulatory Visit (INDEPENDENT_AMBULATORY_CARE_PROVIDER_SITE_OTHER): Payer: Medicare Other

## 2016-04-01 ENCOUNTER — Telehealth: Payer: Self-pay

## 2016-04-01 DIAGNOSIS — E119 Type 2 diabetes mellitus without complications: Secondary | ICD-10-CM | POA: Diagnosis not present

## 2016-04-01 DIAGNOSIS — E7800 Pure hypercholesterolemia, unspecified: Secondary | ICD-10-CM | POA: Diagnosis not present

## 2016-04-01 LAB — LIPID PANEL
CHOLESTEROL: 153 mg/dL (ref 0–200)
HDL: 38.7 mg/dL — AB (ref >39.00)
LDL Cholesterol: 79 mg/dL (ref 0–99)
NonHDL: 114.14
Total CHOL/HDL Ratio: 4
Triglycerides: 176 mg/dL — ABNORMAL HIGH (ref 0.0–149.0)
VLDL: 35.2 mg/dL (ref 0.0–40.0)

## 2016-04-01 LAB — COMPREHENSIVE METABOLIC PANEL
ALK PHOS: 54 U/L (ref 39–117)
ALT: 43 U/L — ABNORMAL HIGH (ref 0–35)
AST: 55 U/L — ABNORMAL HIGH (ref 0–37)
Albumin: 4 g/dL (ref 3.5–5.2)
BUN: 31 mg/dL — ABNORMAL HIGH (ref 6–23)
CHLORIDE: 107 meq/L (ref 96–112)
CO2: 23 meq/L (ref 19–32)
Calcium: 9.2 mg/dL (ref 8.4–10.5)
Creatinine, Ser: 0.95 mg/dL (ref 0.40–1.20)
GFR: 61.58 mL/min (ref >60.00)
GLUCOSE: 131 mg/dL — AB (ref 70–99)
POTASSIUM: 3.9 meq/L (ref 3.5–5.1)
Sodium: 138 mEq/L (ref 135–145)
TOTAL PROTEIN: 7 g/dL (ref 6.0–8.3)
Total Bilirubin: 0.4 mg/dL (ref 0.2–1.2)

## 2016-04-01 LAB — HEMOGLOBIN A1C: Hgb A1c MFr Bld: 6.3 % (ref 4.6–6.5)

## 2016-04-01 NOTE — Telephone Encounter (Signed)
Pt has 6 mth f/u with Dr Diona Browner on 04/05/16.

## 2016-04-01 NOTE — Telephone Encounter (Signed)
PLEASE NOTE: All timestamps contained within this report are represented as [place] Standard Time. CONFIDENTIALTY NOTICE: This fax transmission is intended only for the addressee. It contains information that is legally privileged, confidential or otherwise protected from use or disclosure. If you are not the intended recipient, you are strictly prohibited from reviewing, disclosing, copying using or disseminating any of this information or taking any action in reliance on or regarding this information. If you have received this fax in error, please notify us immediately by telephone so that we can arrange for its return to [place]. Phone: [phone], Toll-Free: [phone], Fax: [phone] Page: 1 of 2 Call Id: [id number] Nelson Patient Name: GENNY BALL Gender: Female DOB: 1946-04-22 Age: 70 Y 71 M 26 D Return Phone Number: [phone] (Primary), [phone] (Secondary) Address: City/State/Zip: [place] Night - Client Client Site [facility]ician Eliezer Lofts - MD Contact Type Call Who Is Calling Patient / Member / Family / Caregiver Call Type Triage / Clinical Relationship To Patient Self Return Phone Number [phone] (Primary) Chief Complaint Blood Sugar High Reason for Call Symptomatic / Request for Health Information Initial Comment Caller states her sugar is 313. She had a shot in her knee. PreDisposition Call Doctor Translation No Nurse Assessment Nurse: [place] Desanctis, RN, Amy Date/Time Eilene Ghazi Time): 03/29/2016 11:26:42 PM Confirm and document reason for call. If symptomatic, describe symptoms. You must click the next button to save text entered. ---Caller states she had a steroid shot today in knee and tonight her blood sugar tonight is 313 and now down to 243. No problems with knee currently. Has the patient  traveled out of the country within the last 30 days? ---No Does the patient have any new or worsening symptoms? ---Yes Will a triage be completed? ---Yes Related visit to physician within the last 2 weeks? ---No Does the PT have any chronic conditions? (i.e. diabetes, asthma, etc.) ---Yes List chronic conditions. ---diabetic, htn Is this a behavioral health or substance abuse call? ---No Guidelines Guideline Title Affirmed Question Affirmed Notes Nurse Date/Time (Eastern Time) Diabetes - High Blood Sugar [1] Blood glucose > 240 mg/dl (13 mmol/l) AND [2] does not use insulin (e.g., not insulindependent; most type 2 diabetics) (all triage questions negative) [place], RN, Amy 03/29/2016 11:28:49 PM Disp. Time Eilene Ghazi Time) Disposition Final User PLEASE NOTE: All timestamps contained within this report are represented as [place] Standard Time. CONFIDENTIALTY NOTICE: This fax transmission is intended only for the addressee. It contains information that is legally privileged, confidential or otherwise protected from use or disclosure. If you are not the intended recipient, you are strictly prohibited from reviewing, disclosing, copying using or disseminating any of this information or taking any action in reliance on or regarding this information. If you have received this fax in error, please notify us immediately by telephone so that we can arrange for its return to [place]. Phone: [phone], Toll-Free: [phone], Fax: [phone] Page: 2 of 2 Call Id: [id number] 03/29/2016 11:37:08 PM Nina, RN, Amy Caller Understands: Yes Disagree/Comply: Comply Care Advice Given Per Guideline HOME CARE: You should be able to treat this at home. TREATMENT - DIABETES MEDICATIONS: Continue taking your diabetes pills.[name] CALL BACK IF: CARE ADVICE given per Diabetes - High Blood Sugar (Adult) guideline. * You become worse or have more questions. * Rapid breathing occurs * Vomiting lasting  over 4 hours or  unable to drink any fluids * Blood glucose over 300 mg/dL (16.5 mmol/l), two or more times in a row. * Drink at least one glass (8 oz; 240 ml) of water per hour for the next 4 hours (Reason: adequate hydration will reduce hyperglycemia). * Generally, you should try to drink 6-8 glasses of water each day. TREATMENT - LIQUIDS: MEASURE AND RECORD YOUR BLOOD GLUCOSE: * Measure your blood glucose before breakfast and before going to bed. * Record the results and show them to your doctor at your next office visit. * You and your doctor should decide on what your blood glucose goals should be. Typical goals for most non-pregnant adults who perform daily fingerstick blood testing at home shown below. CHECK FOR KETONES: EXPECTED COURSE - You should CALL YOUR DOCTOR WITHIN 1-3 DAYS if: * Your blood sugar continues to get above 240 mg/dl (13 mmol/l). * Your blood sugar continues to be higher than the glucose goals your doctor set for you.It has been longer than 6 months since you had an Hemoglobin A1C test.

## 2016-04-05 ENCOUNTER — Ambulatory Visit (INDEPENDENT_AMBULATORY_CARE_PROVIDER_SITE_OTHER): Payer: Medicare Other | Admitting: Family Medicine

## 2016-04-05 ENCOUNTER — Encounter: Payer: Self-pay | Admitting: Family Medicine

## 2016-04-05 DIAGNOSIS — E119 Type 2 diabetes mellitus without complications: Secondary | ICD-10-CM | POA: Diagnosis not present

## 2016-04-05 DIAGNOSIS — E7800 Pure hypercholesterolemia, unspecified: Secondary | ICD-10-CM | POA: Diagnosis not present

## 2016-04-05 DIAGNOSIS — R748 Abnormal levels of other serum enzymes: Secondary | ICD-10-CM

## 2016-04-05 DIAGNOSIS — I10 Essential (primary) hypertension: Secondary | ICD-10-CM | POA: Diagnosis not present

## 2016-04-05 LAB — HM DIABETES FOOT EXAM

## 2016-04-05 NOTE — Assessment & Plan Note (Signed)
Well controlled. Continue current medication.  

## 2016-04-05 NOTE — Assessment & Plan Note (Signed)
No clear cause.  Avoid tylenol and ETOH. Recheck in 2 weeks.
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
## 2016-04-05 NOTE — Progress Notes (Signed)
Pre visit review using our clinic review tool, if applicable. No additional management support is needed unless otherwise documented below in the visit note. 

## 2016-04-05 NOTE — Assessment & Plan Note (Signed)
LDL at goal but HDL to low. Get back to exercise.  Trig improving.

## 2016-04-05 NOTE — Patient Instructions (Addendum)
Get back to exercise as tolerated.  Increase water.  Call if sugars seem to be remaining high.

## 2016-04-19 ENCOUNTER — Other Ambulatory Visit (INDEPENDENT_AMBULATORY_CARE_PROVIDER_SITE_OTHER): Payer: Medicare Other

## 2016-04-19 DIAGNOSIS — R748 Abnormal levels of other serum enzymes: Secondary | ICD-10-CM | POA: Diagnosis not present

## 2016-04-19 LAB — HEPATIC FUNCTION PANEL
ALBUMIN: 4.1 g/dL (ref 3.5–5.2)
ALK PHOS: 65 U/L (ref 39–117)
ALT: 19 U/L (ref 0–35)
AST: 18 U/L (ref 0–37)
Bilirubin, Direct: 0 mg/dL (ref 0.0–0.3)
TOTAL PROTEIN: 7.1 g/dL (ref 6.0–8.3)
Total Bilirubin: 0.5 mg/dL (ref 0.2–1.2)

## 2016-05-23 DIAGNOSIS — L661 Lichen planopilaris: Secondary | ICD-10-CM | POA: Diagnosis not present

## 2016-06-07 ENCOUNTER — Encounter: Payer: Self-pay | Admitting: Family Medicine

## 2016-06-07 ENCOUNTER — Telehealth: Payer: Self-pay | Admitting: Family Medicine

## 2016-06-07 ENCOUNTER — Ambulatory Visit
Admission: RE | Admit: 2016-06-07 | Discharge: 2016-06-07 | Disposition: A | Discharge status: Home / Self Care | Payer: Medicare Other | Source: Ambulatory Visit | Attending: Family Medicine | Admitting: Family Medicine

## 2016-06-07 ENCOUNTER — Ambulatory Visit (INDEPENDENT_AMBULATORY_CARE_PROVIDER_SITE_OTHER): Payer: Medicare Other | Admitting: Family Medicine

## 2016-06-07 VITALS — BP 126/64 | HR 76 | Temp 98.5°F | Ht 62.25 in | Wt 173.5 lb

## 2016-06-07 DIAGNOSIS — R109 Unspecified abdominal pain: Secondary | ICD-10-CM | POA: Insufficient documentation

## 2016-06-07 DIAGNOSIS — R932 Abnormal findings on diagnostic imaging of liver and biliary tract: Secondary | ICD-10-CM | POA: Diagnosis not present

## 2016-06-07 DIAGNOSIS — R748 Abnormal levels of other serum enzymes: Secondary | ICD-10-CM

## 2016-06-07 DIAGNOSIS — R1011 Right upper quadrant pain: Secondary | ICD-10-CM | POA: Diagnosis not present

## 2016-06-07 LAB — POC URINALSYSI DIPSTICK (AUTOMATED)
Bilirubin, UA: NEGATIVE
Blood, UA: NEGATIVE
Glucose, UA: NEGATIVE
Ketones, UA: NEGATIVE
NITRITE UA: NEGATIVE
PH UA: 6
PROTEIN UA: NEGATIVE
Spec Grav, UA: 1.02
UROBILINOGEN UA: 0.2

## 2016-06-07 LAB — COMPREHENSIVE METABOLIC PANEL
ALK PHOS: 64 U/L (ref 39–117)
ALT: 18 U/L (ref 0–35)
AST: 17 U/L (ref 0–37)
Albumin: 4.3 g/dL (ref 3.5–5.2)
BILIRUBIN TOTAL: 0.5 mg/dL (ref 0.2–1.2)
BUN: 18 mg/dL (ref 6–23)
CALCIUM: 9.6 mg/dL (ref 8.4–10.5)
CO2: 29 meq/L (ref 19–32)
CREATININE: 0.89 mg/dL (ref 0.40–1.20)
Chloride: 104 mEq/L (ref 96–112)
GFR: 66.36 mL/min (ref >60.00)
Glucose, Bld: 128 mg/dL — ABNORMAL HIGH (ref 70–99)
Potassium: 4.4 mEq/L (ref 3.5–5.1)
Sodium: 140 mEq/L (ref 135–145)
TOTAL PROTEIN: 7.1 g/dL (ref 6.0–8.3)

## 2016-06-07 LAB — CBC WITH DIFFERENTIAL/PLATELET
BASOS ABS: 0 10*3/uL (ref 0.0–0.1)
BASOS PCT: 0.5 % (ref 0.0–3.0)
EOS ABS: 0.1 10*3/uL (ref 0.0–0.7)
Eosinophils Relative: 2.2 % (ref 0.0–5.0)
HCT: 36.7 % (ref 36.0–46.0)
HEMOGLOBIN: 12.7 g/dL (ref 12.0–15.0)
LYMPHS PCT: 32.6 % (ref 12.0–46.0)
Lymphs Abs: 1.7 10*3/uL (ref 0.7–4.0)
MCHC: 34.6 g/dL (ref 30.0–36.0)
MCV: 95.9 fl (ref 78.0–100.0)
MONO ABS: 0.5 10*3/uL (ref 0.1–1.0)
Monocytes Relative: 9.7 % (ref 3.0–12.0)
NEUTROS ABS: 2.9 10*3/uL (ref 1.4–7.7)
Neutrophils Relative %: 55 % (ref 43.0–77.0)
PLATELETS: 204 10*3/uL (ref 150.0–400.0)
RBC: 3.82 Mil/uL — ABNORMAL LOW (ref 3.87–5.11)
RDW: 14 % (ref 11.5–15.5)
WBC: 5.3 10*3/uL (ref 4.0–10.5)

## 2016-06-07 LAB — POCT UA - MICROSCOPIC ONLY

## 2016-06-07 LAB — LIPASE: Lipase: 38 U/L (ref 11.0–59.0)

## 2016-06-07 NOTE — Telephone Encounter (Signed)
HIDA ordered

## 2016-06-07 NOTE — Telephone Encounter (Signed)
-----   Message from Carter Kitten, [place] sent at 06/07/2016  5:29 PM EDT ----- Ms. Kerkhoff notified as instructed by telephone.  She would like to go ahead with the HIDA scan.

## 2016-06-07 NOTE — Progress Notes (Signed)
   Subjective:  nd an half.  Patient ID: Katie [name], female    DOB: April 10, 1946, 70 y.o.   MRN: [id number]  HPI  70 year old female with well controlled DM, HTN, high chol presents with new onset pain in right  Flank. Dull pain, intermittant but usually tender to touch.. Ongoing x 2-3 weeks. She has also had occ episodes of sharp pain in side. Few episodes pain in mid back when leaning over  It improved on its own.  Worse when lying on it. No relation to eating.  Cloudy urine, no dysuria, no heamaturia, no frequency, always has urgency.  No fever. Nml BM, no blood stool.  She is concerned given recent increased LFTS. 04/01/2016 : 55, 43 Improved  In 2 weeks on recheck. Sister with liver hematoma. She has been on low dose statins for years.   She going out of country to [place]  For month a     Lab Results  Component Value Date   HGBA1C 6.3 04/01/2016      Review of Systems  Constitutional: Negative for fatigue and fever.  HENT: Negative for ear pain.   Eyes: Negative for pain.  Respiratory: Negative for chest tightness and shortness of breath.   Cardiovascular: Negative for chest pain, palpitations and leg swelling.  Gastrointestinal: Positive for abdominal pain.  Genitourinary: Negative for dysuria.       Objective:   Physical Exam  Constitutional: Vital signs are normal. She appears well-developed and well-nourished. She is cooperative.  Non-toxic appearance. She does not appear ill. No distress.  HENT:  Head: Normocephalic.  Right Ear: Hearing, tympanic membrane, external ear and ear canal normal. Tympanic membrane is not erythematous, not retracted and not bulging.  Left Ear: Hearing, tympanic membrane, external ear and ear canal normal. Tympanic membrane is not erythematous, not retracted and not bulging.  Nose: No mucosal edema or rhinorrhea. Right sinus exhibits no maxillary sinus tenderness and no frontal sinus tenderness. Left sinus exhibits no maxillary  sinus tenderness and no frontal sinus tenderness.  Mouth/Throat: Uvula is midline, oropharynx is clear and moist and mucous membranes are normal.  Eyes: Conjunctivae, EOM and lids are normal. Pupils are equal, round, and reactive to light. Lids are everted and swept, no foreign bodies found.  Neck: Trachea normal and normal range of motion. Neck supple. Carotid bruit is not present. No thyroid mass and no thyromegaly present.  Cardiovascular: Normal rate, regular rhythm, S1 normal, S2 normal, normal heart sounds, intact distal pulses and normal pulses.  Exam reveals no gallop and no friction rub.   No murmur heard. Pulmonary/Chest: Effort normal and breath sounds normal. No tachypnea. No respiratory distress. She has no decreased breath sounds. She has no wheezes. She has no rhonchi. She has no rales.  Abdominal: Soft. Normal appearance and bowel sounds are normal. There is no hepatosplenomegaly. There is tenderness in the right upper quadrant. There is no CVA tenderness and negative Murphy's sign. No hernia.  Neurological: She is alert.  Skin: Skin is warm, dry and intact. No rash noted.  Psychiatric: Her speech is normal and behavior is normal. Judgment and thought content normal. Her mood appears not anxious. Cognition and memory are normal. She does not exhibit a depressed mood.    Central obesity      Assessment & Plan:

## 2016-06-07 NOTE — Assessment & Plan Note (Signed)
Not clearly UTI... Likely contaminated. Send for culture.  Will eval with CMET, lipase and [place] to assess liver gallbladder and pancreas.

## 2016-06-07 NOTE — Patient Instructions (Signed)
Stop at lab and front desk on way out.  We will call with urine culture, lab and [place] results.  Go to ER if severe pain or fever or uncontrollable emesis.

## 2016-06-07 NOTE — Progress Notes (Signed)
Pre visit review using our clinic review tool, if applicable. No additional management support is needed unless otherwise documented below in the visit note. 

## 2016-06-09 LAB — URINE CULTURE

## 2016-06-20 ENCOUNTER — Ambulatory Visit
Admission: RE | Admit: 2016-06-20 | Discharge: 2016-06-20 | Disposition: A | Discharge status: Home / Self Care | Payer: Medicare Other | Source: Ambulatory Visit | Attending: Family Medicine | Admitting: Family Medicine

## 2016-06-20 DIAGNOSIS — R1011 Right upper quadrant pain: Secondary | ICD-10-CM | POA: Insufficient documentation

## 2016-06-20 DIAGNOSIS — K829 Disease of gallbladder, unspecified: Secondary | ICD-10-CM | POA: Insufficient documentation

## 2016-06-20 DIAGNOSIS — R109 Unspecified abdominal pain: Secondary | ICD-10-CM

## 2016-06-20 MED ORDER — TECHNETIUM TC 99M MEBROFENIN IV KIT
5.0000 | PACK | Freq: Once | INTRAVENOUS | Status: AC | PRN
  Administered 2016-06-20: 4.89 via INTRAVENOUS

## 2016-06-21 ENCOUNTER — Telehealth: Payer: Self-pay | Admitting: Family Medicine

## 2016-06-21 DIAGNOSIS — K828 Other specified diseases of gallbladder: Secondary | ICD-10-CM

## 2016-06-21 NOTE — Telephone Encounter (Signed)
-----   Message from Carter Kitten, [place] sent at 06/21/2016 11:31 AM EDT ----- Ms. Albee notified as instructed by telephone.  She is agreeable for the surgical referral. Has a cruise planned toward the end of November and would really like to have this taken care of prior to her cruise.

## 2016-06-21 NOTE — Telephone Encounter (Signed)
Order placed for referral.  

## 2016-06-25 ENCOUNTER — Encounter: Payer: Self-pay | Admitting: Family Medicine

## 2016-06-26 ENCOUNTER — Encounter: Payer: Self-pay | Admitting: Family Medicine

## 2016-06-26 ENCOUNTER — Ambulatory Visit (INDEPENDENT_AMBULATORY_CARE_PROVIDER_SITE_OTHER): Payer: Medicare Other | Admitting: General Surgery

## 2016-06-26 ENCOUNTER — Encounter: Payer: Self-pay | Admitting: General Surgery

## 2016-06-26 VITALS — BP 152/66 | HR 76 | Temp 97.8°F | Ht 62.5 in | Wt 170.0 lb

## 2016-06-26 DIAGNOSIS — K828 Other specified diseases of gallbladder: Secondary | ICD-10-CM | POA: Diagnosis not present

## 2016-06-26 NOTE — Patient Instructions (Signed)
We have your surgery scheduled for 07/08/16 with Dr.Woodham at [facility]. Please see your [facility] pre-care sheet for surgery information. Please call our office if you have any questions or concerns.

## 2016-06-26 NOTE — Progress Notes (Signed)
Patient ID: Katie [name], female   DOB: 09-06-1945, 70 y.o.   MRN: [id number]  CC: RUQ PAIN  HPI Katie [name] is a 70 y.o. female who presents to clinic today for evaluation of right upper quadrant abdominal pain. Patient reports that for the last 2 months she has had some constant pain with intermittent sharp stabbing pains and her right upper quadrant. This is associated with bloating and stools that are lighter in color and chalky per the patient. Patient reports that eating makes the pain worse especially if she eats fried food. This is never happened before. The pain is described as a sharp, stabbing sensation when it is at its worst. She does report some nausea in the past but none currently. She denies any fevers, chills, chest pain, shortness of breath, constipation.  HPI  Past Medical History:  Diagnosis Date  . Allergy   . Anemia   . Blood transfusion without reported diagnosis   . Diverticulitis   . DJD (degenerative joint disease)   . DM type 2 (diabetes mellitus, type 2) ([place])   . Fibrocystic breast disease   . GERD (gastroesophageal reflux disease)   . History of chicken pox   . Hypercholesteremia   . Hypertension   . Lichen planopilaris   . Urinary incontinence     Past Surgical History:  Procedure Laterality Date  . BREAST SURGERY  02/1975   Left Breast Biopsy-Fibrocystic Disease  . BREAST SURGERY  10/1981   Bialteral Capsulectomy Breast Surgery  . BREAST SURGERY  07/1998   Replace Bilateral Silicone Implants  . BREAST SURGERY  01/1976   Bilater breast surgery to remove fibrocystic tissue and replace with silicone implants  . DILATION AND CURETTAGE OF UTERUS  01/1994  . FINGER SURGERY  2001 & 2003   Trigger Finger  . FOOT SURGERY  1980   To Relieve Pinched Nerve  . FOOT SURGERY  07/2008   Left Foot-Plantar Fasciitis  . JOINT REPLACEMENT  04/19/2013   Hip Replacement-Right  . PLACEMENT OF BREAST IMPLANTS  12/26/2004   Replace Failed Implants  .  RHINOPLASTY  03/1964   To Correct Deviated Septum    Family History  Problem Relation Age of Onset  . Arthritis Mother   . Hypertension Mother   . Heart disease Father   . Diabetes Father   . Breast cancer Sister   . Hypertension Brother   . Leukemia Brother   . Coronary artery disease Brother     Social History Social History  Substance Use Topics  . Smoking status: Never Smoker  . Smokeless tobacco: Never Used  . Alcohol use No    Allergies  Allergen Reactions  . Ciprofloxacin     REACTION: rash  . Daypro [Oxaprozin]   . Enalapril Maleate Nausea Only  . Erythromycin     REACTION: Rash, hives  . Nsaids   . Vioxx [Rofecoxib]     Current Outpatient Prescriptions  Medication Sig Dispense Refill  . atorvastatin (LIPITOR) 10 MG tablet Take 1 tablet (10 mg total) by mouth daily. 30 tablet 11  . azelastine (ASTELIN) 137 MCG/SPRAY nasal spray Place 1 spray into the nose 2 (two) times daily. Use in each nostril as directed    . Blood Glucose Monitoring Suppl (ONE TOUCH ULTRA 2) w/Device KIT Use to check blood sugar daily.  E11.9 1 each 0  . Calcium-Vitamin D-Vitamin K (VIACTIV) 267-124-58 MG-UNT-MCG CHEW Chew 1 each by mouth daily.    . clobetasol (TEMOVATE) 0.05 %  external solution Apply 1 application topically. Reported on 08/16/2015    . fluticasone (FLONASE) 50 MCG/ACT nasal spray Place 2 sprays into both nostrils daily. 16 g 1  . glucose blood test strip ONETOUCH ULTRA 2 TEST STRIPS.  Use to check blood sugar daily.  E11.9 100 each 3  . hydrochlorothiazide (HYDRODIURIL) 25 MG tablet Take 1 tablet (25 mg total) by mouth daily. 30 tablet 11  . Lancets (ONETOUCH ULTRASOFT) lancets Use to check blood sugar daily.  E11.9 100 each 3  . loratadine (CLARITIN) 10 MG tablet Take 10 mg by mouth daily.    [name] losartan (COZAAR) 100 MG tablet Take 1 tablet (100 mg total) by mouth daily. 30 tablet 11  . metFORMIN (GLUCOPHAGE) 500 MG tablet Take 1 tablet (500 mg total) by mouth daily. 30  tablet 11   No current facility-administered medications for this visit.      Review of Systems A Multi-point review of systems was asked and was negative except for the findings documented in the history of present illness  Physical Exam Blood pressure (!) 152/66, pulse 76, temperature 97.8 F (36.6 C), temperature source Oral, height 5' 2.5" (1.588 m), weight 77.1 kg (170 lb). CONSTITUTIONAL: No acute distress. EYES: Pupils are equal, round, and reactive to light, Sclera are non-icteric. EARS, NOSE, MOUTH AND THROAT: The oropharynx is clear. The oral mucosa is pink and moist. Hearing is intact to voice. LYMPH NODES:  Lymph nodes in the neck are normal. RESPIRATORY:  Lungs are clear. There is normal respiratory effort, with equal breath sounds bilaterally, and without pathologic use of accessory muscles. CARDIOVASCULAR: Heart is regular without murmurs, gallops, or rubs. GI: The abdomen is soft, minimally tender to deep palpation in the right upper quadrant but with a negative Murphy sign and without any rebound or guarding, and nondistended. There are no palpable masses. There is no hepatosplenomegaly. There are normal bowel sounds in all quadrants. GU: Rectal deferred.   MUSCULOSKELETAL: Normal muscle strength and tone. No cyanosis or edema.   SKIN: Turgor is good and there are no pathologic skin lesions or ulcers. NEUROLOGIC: Motor and sensation is grossly normal. Cranial nerves are grossly intact. PSYCH:  Oriented to person, place and time. Affect is normal.  Data Reviewed Images and labs reviewed labs that were performed last month are all within normal limits. Ultrasound of the right upper quadrant showed some potential sludge but no evidence of gallstones and without any gallbladder wall thickening, ductal dilatation, pericholecystic fluid. A HIDA scan was then obtained which showed a decreased ejection fraction to 25%. Patient reports that the HIDA scan brought on her symptoms. I  have personally reviewed the patient's imaging, laboratory findings and medical records.    Assessment    Biliary dyskinesia    Plan    70 year old female process clinic for evaluation of right upper quadrant pain with a workup consistent with biliary dyskinesia. Discussed with her the diagnosis of biliary dyskinesia in detail to include the likely benefits of a laparoscopic cholecystectomy. I discussed the procedure in detail.  The patient was given Neurosurgeon.  We discussed the risks and benefits of a laparoscopic cholecystectomy and possible cholangiogram including, but not limited to bleeding, infection, injury to surrounding structures such as the intestine or liver, bile leak, retained gallstones, need to convert to an open procedure, prolonged diarrhea, blood clots such as  DVT, common bile duct injury, anesthesia risks, and possible need for additional procedures.  The likelihood of improvement in symptoms and  return to the patient's normal status is good. We discussed the typical post-operative recovery course. Patient voiced understanding and desired to proceed. She is concerned about the timing of surgery as she is leaving on a cruise on November 28 and will not be back for approximately 1 month. Given this we'll plan to perform surgery on November 13 so that she can be mostly recovered prior to departure. All questions transient the patient's satisfaction.      Time spent with the patient was 45 minutes, with more than 50% of the time spent in face-to-face education, counseling and care coordination.     [name], MD FACS General Surgeon 06/26/2016, 12:16 PM

## 2016-06-27 ENCOUNTER — Telehealth: Payer: Self-pay | Admitting: General Surgery

## 2016-06-27 DIAGNOSIS — Z23 Encounter for immunization: Secondary | ICD-10-CM | POA: Diagnosis not present

## 2016-06-27 NOTE — Telephone Encounter (Signed)
Pt advised of pre op date/time and sx date. Sx: 07/08/16 with Dr Gabrielle Dare cholecystectomy.  Pre op: 07/01/16 @ 1:45pm--Office.   Patient made aware to call [phone], between 1-3:00pm the day before surgery, to find out what time to arrive.

## 2016-07-01 ENCOUNTER — Encounter
Admission: RE | Admit: 2016-07-01 | Discharge: 2016-07-01 | Disposition: A | Discharge status: Home / Self Care | Payer: Medicare Other | Source: Ambulatory Visit | Attending: General Surgery | Admitting: General Surgery

## 2016-07-01 ENCOUNTER — Ambulatory Visit
Admission: RE | Admit: 2016-07-01 | Discharge: 2016-07-01 | Disposition: A | Discharge status: Home / Self Care | Payer: Medicare Other | Source: Ambulatory Visit | Attending: General Surgery | Admitting: General Surgery

## 2016-07-01 DIAGNOSIS — R109 Unspecified abdominal pain: Secondary | ICD-10-CM | POA: Diagnosis not present

## 2016-07-01 DIAGNOSIS — Z01810 Encounter for preprocedural cardiovascular examination: Secondary | ICD-10-CM | POA: Diagnosis not present

## 2016-07-01 DIAGNOSIS — M8580 Other specified disorders of bone density and structure, unspecified site: Secondary | ICD-10-CM | POA: Diagnosis not present

## 2016-07-01 DIAGNOSIS — K828 Other specified diseases of gallbladder: Secondary | ICD-10-CM | POA: Insufficient documentation

## 2016-07-01 DIAGNOSIS — E7800 Pure hypercholesterolemia, unspecified: Secondary | ICD-10-CM | POA: Insufficient documentation

## 2016-07-01 DIAGNOSIS — Z8719 Personal history of other diseases of the digestive system: Secondary | ICD-10-CM | POA: Diagnosis not present

## 2016-07-01 DIAGNOSIS — Z01818 Encounter for other preprocedural examination: Secondary | ICD-10-CM | POA: Insufficient documentation

## 2016-07-01 DIAGNOSIS — I10 Essential (primary) hypertension: Secondary | ICD-10-CM | POA: Insufficient documentation

## 2016-07-01 DIAGNOSIS — K219 Gastro-esophageal reflux disease without esophagitis: Secondary | ICD-10-CM | POA: Insufficient documentation

## 2016-07-01 DIAGNOSIS — E119 Type 2 diabetes mellitus without complications: Secondary | ICD-10-CM | POA: Diagnosis not present

## 2016-07-01 DIAGNOSIS — D649 Anemia, unspecified: Secondary | ICD-10-CM | POA: Diagnosis not present

## 2016-07-01 NOTE — Patient Instructions (Signed)
Your procedure is scheduled on: Monday 07/08/16 Report to Day Surgery. 2ND FLOOR MEDICAL MALL ENTRANCE To find out your arrival time please call [phone] between 1PM - 3PM on Friday 07/05/16.  Remember: Instructions that are not followed completely may result in serious medical risk, up to and including death, or upon the discretion of your surgeon and anesthesiologist your surgery may need to be rescheduled.    __X__ 1. Do not eat food or drink liquids after midnight. No gum chewing or hard candies.     __X__ 2. No Alcohol for 24 hours before or after surgery.   ____ 3. Bring all medications with you on the day of surgery if instructed.    __X__ 4. Notify your doctor if there is any change in your medical condition     (cold, fever, infections).     Do not wear jewelry, make-up, hairpins, clips or nail polish.  Do not wear lotions, powders, or perfumes.   Do not shave 48 hours prior to surgery. Men may shave face and neck.  Do not bring valuables to the hospital.    [facility] is not responsible for any belongings or valuables.               Contacts, dentures or bridgework may not be worn into surgery.  Leave your suitcase in the car. After surgery it may be brought to your room.  For patients admitted to the hospital, discharge time is determined by your                treatment team.   Patients discharged the day of surgery will not be allowed to drive home.   Please read over the following fact sheets that you were given:   Pain Booklet   __X__ Take these medicines the morning of surgery with A SIP OF WATER:    1. LOSARTAN  2. LORATADINE  3.   4.  5.  6.  ____ Fleet Enema (as directed)   __X__ Use CHG Soap as directed  ____ Use inhalers on the day of surgery  __X__ Stop metformin 2 days prior to surgery    ____ Take 1/2 of usual insulin dose the night before surgery and none on the morning of surgery.   ____ Stop Coumadin/Plavix/aspirin on   ____ Stop  Anti-inflammatories on    ____ Stop supplements until after surgery.    ____ Bring C-Pap to the hospital.

## 2016-07-04 ENCOUNTER — Ambulatory Visit (INDEPENDENT_AMBULATORY_CARE_PROVIDER_SITE_OTHER): Payer: Medicare Other | Admitting: Family Medicine

## 2016-07-04 ENCOUNTER — Encounter: Payer: Self-pay | Admitting: Family Medicine

## 2016-07-04 VITALS — BP 118/80 | HR 81 | Temp 97.4°F | Wt 172.0 lb

## 2016-07-04 DIAGNOSIS — J302 Other seasonal allergic rhinitis: Secondary | ICD-10-CM

## 2016-07-04 NOTE — Patient Instructions (Signed)
Please resume your nasal sprays and take your Claritin daily Drink enough liquids to make your urine light yellow

## 2016-07-04 NOTE — Progress Notes (Signed)
Pre visit review using our clinic review tool, if applicable. No additional management support is needed unless otherwise documented below in the visit note. 

## 2016-07-04 NOTE — Progress Notes (Signed)
Subjective:    Patient ID: Katie [name], female    DOB: August 01, 1946, 70 y.o.   MRN: [id number]  HPI This is a very pleasant 70 yo female who presents today with 2 days of nasal congestion, sinus pressure, sore throat 2 days ago, watery nasal drainage. No cough, some post nasal drainage. No fever. No SOB, no wheeze. Took Claritin with some improvement of drainage. Has problems with allergy symptoms in fall. Has atrovent nasal spray and flonase at home- has not been using. Does not feel bad, is just concerned because she is scheduled for lap choley 07/08/16. She is going on a trip to [place] to see her new 28 week old grandchild and then on a cruise out of [place] two weeks following her surgery. She had a negative pre-op chest xray 2 days ago.   Past Medical History:  Diagnosis Date  . Allergy   . Anemia   . Blood transfusion without reported diagnosis   . Diverticulitis   . DJD (degenerative joint disease)   . DM type 2 (diabetes mellitus, type 2) ([place])   . Fibrocystic breast disease   . GERD (gastroesophageal reflux disease)   . History of chicken pox   . Hypercholesteremia   . Hypertension   . Lichen planopilaris   . Urinary incontinence    Past Surgical History:  Procedure Laterality Date  . BREAST SURGERY  02/1975   Left Breast Biopsy-Fibrocystic Disease  . BREAST SURGERY  10/1981   Bialteral Capsulectomy Breast Surgery  . BREAST SURGERY  07/1998   Replace Bilateral Silicone Implants  . BREAST SURGERY  01/1976   Bilater breast surgery to remove fibrocystic tissue and replace with silicone implants  . DILATION AND CURETTAGE OF UTERUS  01/1994  . FINGER SURGERY  2001 & 2003   Trigger Finger  . FOOT SURGERY  1980   To Relieve Pinched Nerve  . FOOT SURGERY  07/2008   Left Foot-Plantar Fasciitis  . JOINT REPLACEMENT  04/19/2013   Hip Replacement-Right  . PLACEMENT OF BREAST IMPLANTS  12/26/2004   Replace Failed Implants  . RHINOPLASTY  03/1964   To Correct Deviated Septum    Family History  Problem Relation Age of Onset  . Arthritis Mother   . Hypertension Mother   . Heart disease Father   . Diabetes Father   . Breast cancer Sister   . Hypertension Brother   . Leukemia Brother   . Coronary artery disease Brother    Social History  Substance Use Topics  . Smoking status: Never Smoker  . Smokeless tobacco: Never Used  . Alcohol use No      Review of Systems Per HPI    Objective:   Physical Exam  Constitutional: She is oriented to person, place, and time. She appears well-developed and well-nourished. No distress.  HENT:  Head: Normocephalic and atraumatic.  Right Ear: Tympanic membrane, external ear and ear canal normal.  Left Ear: Tympanic membrane, external ear and ear canal normal.  Nose: Mucosal edema and rhinorrhea present. Right sinus exhibits no maxillary sinus tenderness and no frontal sinus tenderness. Left sinus exhibits no maxillary sinus tenderness and no frontal sinus tenderness.  Mouth/Throat: Uvula is midline. No oropharyngeal exudate, posterior oropharyngeal edema or posterior oropharyngeal erythema.  Audible nasal congestion. Post nasal drainage.   Eyes: Conjunctivae are normal. Right eye exhibits no discharge. Left eye exhibits no discharge.  Neck: Normal range of motion. Neck supple.  Cardiovascular: Normal rate, regular rhythm and normal heart  sounds.   Pulmonary/Chest: Effort normal and breath sounds normal.  Musculoskeletal: She exhibits no edema.  Lymphadenopathy:    She has no cervical adenopathy.  Neurological: She is alert and oriented to person, place, and time.  Skin: Skin is warm and dry. She is not diaphoretic.  Psychiatric: She has a normal mood and affect. Her behavior is normal. Judgment and thought content normal.  Vitals reviewed.     BP 118/80 (BP Location: Left Arm, Patient Position: Sitting, Cuff Size: Normal)   Pulse 81   Temp 97.4 F (36.3 C) (Oral)   Wt 172 lb (78 kg)   SpO2 96%   BMI 31.46  kg/m  Wt Readings from Last 3 Encounters:  07/04/16 172 lb (78 kg)  07/01/16 170 lb (77.1 kg)  06/26/16 170 lb (77.1 kg)       Assessment & Plan:  1. Chronic seasonal allergic rhinitis, unspecified trigger - reassured patient, instructed her to continue Claritin, to resume Atrovent nasal spray and Flonase - RTC if fever, increased pain or if symptoms don't resolve in 5-7 days   Clarene Reamer, FNP-BC  [facility], [place]  07/04/2016 9:23 AM

## 2016-07-05 ENCOUNTER — Other Ambulatory Visit: Payer: Self-pay | Admitting: Family Medicine

## 2016-07-07 MED ORDER — CEFOXITIN SODIUM 1 G IV SOLR
1.0000 g | INTRAVENOUS | Status: AC
  Administered 2016-07-08: 1 g via INTRAVENOUS
  Filled 2016-07-07: qty 1

## 2016-07-08 ENCOUNTER — Ambulatory Visit
Admission: RE | Admit: 2016-07-08 | Discharge: 2016-07-08 | Disposition: A | Discharge status: Home / Self Care | Payer: Medicare Other | Source: Ambulatory Visit | Attending: General Surgery | Admitting: General Surgery

## 2016-07-08 ENCOUNTER — Ambulatory Visit: Payer: Medicare Other | Admitting: Anesthesiology

## 2016-07-08 ENCOUNTER — Encounter
Admission: RE | Disposition: A | Discharge status: Home / Self Care | Payer: Self-pay | Source: Ambulatory Visit | Attending: General Surgery

## 2016-07-08 ENCOUNTER — Encounter: Payer: Self-pay | Admitting: Anesthesiology

## 2016-07-08 DIAGNOSIS — Z888 Allergy status to other drugs, medicaments and biological substances status: Secondary | ICD-10-CM | POA: Diagnosis not present

## 2016-07-08 DIAGNOSIS — D649 Anemia, unspecified: Secondary | ICD-10-CM | POA: Insufficient documentation

## 2016-07-08 DIAGNOSIS — Z803 Family history of malignant neoplasm of breast: Secondary | ICD-10-CM | POA: Insufficient documentation

## 2016-07-08 DIAGNOSIS — M1990 Unspecified osteoarthritis, unspecified site: Secondary | ICD-10-CM | POA: Insufficient documentation

## 2016-07-08 DIAGNOSIS — E7800 Pure hypercholesterolemia, unspecified: Secondary | ICD-10-CM | POA: Insufficient documentation

## 2016-07-08 DIAGNOSIS — K810 Acute cholecystitis: Secondary | ICD-10-CM | POA: Diagnosis not present

## 2016-07-08 DIAGNOSIS — Z96641 Presence of right artificial hip joint: Secondary | ICD-10-CM | POA: Insufficient documentation

## 2016-07-08 DIAGNOSIS — Z806 Family history of leukemia: Secondary | ICD-10-CM | POA: Diagnosis not present

## 2016-07-08 DIAGNOSIS — Z833 Family history of diabetes mellitus: Secondary | ICD-10-CM | POA: Insufficient documentation

## 2016-07-08 DIAGNOSIS — K5792 Diverticulitis of intestine, part unspecified, without perforation or abscess without bleeding: Secondary | ICD-10-CM | POA: Diagnosis not present

## 2016-07-08 DIAGNOSIS — Z79899 Other long term (current) drug therapy: Secondary | ICD-10-CM | POA: Diagnosis not present

## 2016-07-08 DIAGNOSIS — R32 Unspecified urinary incontinence: Secondary | ICD-10-CM | POA: Insufficient documentation

## 2016-07-08 DIAGNOSIS — Z881 Allergy status to other antibiotic agents status: Secondary | ICD-10-CM | POA: Insufficient documentation

## 2016-07-08 DIAGNOSIS — Z978 Presence of other specified devices: Secondary | ICD-10-CM | POA: Insufficient documentation

## 2016-07-08 DIAGNOSIS — K219 Gastro-esophageal reflux disease without esophagitis: Secondary | ICD-10-CM | POA: Insufficient documentation

## 2016-07-08 DIAGNOSIS — I10 Essential (primary) hypertension: Secondary | ICD-10-CM | POA: Diagnosis not present

## 2016-07-08 DIAGNOSIS — K828 Other specified diseases of gallbladder: Secondary | ICD-10-CM | POA: Insufficient documentation

## 2016-07-08 DIAGNOSIS — E119 Type 2 diabetes mellitus without complications: Secondary | ICD-10-CM | POA: Insufficient documentation

## 2016-07-08 DIAGNOSIS — Z8249 Family history of ischemic heart disease and other diseases of the circulatory system: Secondary | ICD-10-CM | POA: Diagnosis not present

## 2016-07-08 DIAGNOSIS — L661 Lichen planopilaris: Secondary | ICD-10-CM | POA: Insufficient documentation

## 2016-07-08 DIAGNOSIS — N6019 Diffuse cystic mastopathy of unspecified breast: Secondary | ICD-10-CM | POA: Insufficient documentation

## 2016-07-08 DIAGNOSIS — Z8261 Family history of arthritis: Secondary | ICD-10-CM | POA: Diagnosis not present

## 2016-07-08 DIAGNOSIS — K811 Chronic cholecystitis: Secondary | ICD-10-CM | POA: Diagnosis not present

## 2016-07-08 HISTORY — PX: CHOLECYSTECTOMY: SHX55

## 2016-07-08 LAB — GLUCOSE, CAPILLARY
GLUCOSE-CAPILLARY: 126 mg/dL — AB (ref 65–99)
GLUCOSE-CAPILLARY: 165 mg/dL — AB (ref 65–99)

## 2016-07-08 SURGERY — LAPAROSCOPIC CHOLECYSTECTOMY
Anesthesia: General

## 2016-07-08 MED ORDER — LIDOCAINE HCL (CARDIAC) 20 MG/ML IV SOLN
INTRAVENOUS | Status: DC | PRN
  Administered 2016-07-08: 80 mg via INTRAVENOUS

## 2016-07-08 MED ORDER — ONDANSETRON HCL 4 MG/2ML IJ SOLN
INTRAMUSCULAR | Status: DC | PRN
  Administered 2016-07-08: 4 mg via INTRAVENOUS

## 2016-07-08 MED ORDER — SODIUM CHLORIDE 0.9 % IJ SOLN
INTRAMUSCULAR | Status: AC
  Filled 2016-07-08: qty 10

## 2016-07-08 MED ORDER — EPHEDRINE SULFATE 50 MG/ML IJ SOLN
15.0000 mg | Freq: Once | INTRAMUSCULAR | Status: AC
  Administered 2016-07-08: 15 mg via INTRAVENOUS

## 2016-07-08 MED ORDER — CHLORHEXIDINE GLUCONATE CLOTH 2 % EX PADS: 6.0000 | MEDICATED_PAD | Freq: Once | CUTANEOUS | Status: DC

## 2016-07-08 MED ORDER — ONDANSETRON HCL 4 MG/2ML IJ SOLN: 4.0000 mg | Freq: Once | INTRAMUSCULAR | Status: DC | PRN

## 2016-07-08 MED ORDER — PROPOFOL 10 MG/ML IV BOLUS
INTRAVENOUS | Status: DC | PRN
  Administered 2016-07-08: 110 mg via INTRAVENOUS
  Administered 2016-07-08: 40 mg via INTRAVENOUS

## 2016-07-08 MED ORDER — FAMOTIDINE 20 MG PO TABS
20.0000 mg | ORAL_TABLET | Freq: Once | ORAL | Status: AC
  Administered 2016-07-08: 20 mg via ORAL

## 2016-07-08 MED ORDER — BUPIVACAINE HCL (PF) 0.5 % IJ SOLN
INTRAMUSCULAR | Status: AC
  Filled 2016-07-08: qty 30

## 2016-07-08 MED ORDER — FENTANYL CITRATE (PF) 100 MCG/2ML IJ SOLN
INTRAMUSCULAR | Status: DC | PRN
  Administered 2016-07-08: 100 ug via INTRAVENOUS

## 2016-07-08 MED ORDER — ROCURONIUM BROMIDE 100 MG/10ML IV SOLN
INTRAVENOUS | Status: DC | PRN
  Administered 2016-07-08: 30 mg via INTRAVENOUS
  Administered 2016-07-08: 5 mg via INTRAVENOUS

## 2016-07-08 MED ORDER — GLYCOPYRROLATE 0.2 MG/ML IJ SOLN
INTRAMUSCULAR | Status: DC | PRN
  Administered 2016-07-08: .8 mg via INTRAVENOUS

## 2016-07-08 MED ORDER — OXYCODONE HCL 5 MG PO TABS: 5.0000 mg | ORAL_TABLET | ORAL | 0 refills | Status: DC | PRN

## 2016-07-08 MED ORDER — LIDOCAINE HCL 1 % IJ SOLN
INTRAMUSCULAR | Status: DC | PRN
  Administered 2016-07-08: 9 mL via SUBCUTANEOUS
  Administered 2016-07-08: 15 mL via SUBCUTANEOUS

## 2016-07-08 MED ORDER — FAMOTIDINE 20 MG PO TABS
ORAL_TABLET | ORAL | Status: AC
  Filled 2016-07-08: qty 1

## 2016-07-08 MED ORDER — FENTANYL CITRATE (PF) 100 MCG/2ML IJ SOLN
25.0000 ug | INTRAMUSCULAR | Status: DC | PRN
  Administered 2016-07-08 (×5): 25 ug via INTRAVENOUS

## 2016-07-08 MED ORDER — NEOSTIGMINE METHYLSULFATE 10 MG/10ML IV SOLN
INTRAVENOUS | Status: DC | PRN
  Administered 2016-07-08: 4 mg via INTRAVENOUS

## 2016-07-08 MED ORDER — PHENYLEPHRINE HCL 10 MG/ML IJ SOLN
INTRAMUSCULAR | Status: DC | PRN
  Administered 2016-07-08 (×2): 50 ug via INTRAVENOUS
  Administered 2016-07-08: 100 ug via INTRAVENOUS

## 2016-07-08 MED ORDER — FENTANYL CITRATE (PF) 100 MCG/2ML IJ SOLN
INTRAMUSCULAR | Status: AC
  Administered 2016-07-08: 25 ug via INTRAVENOUS
  Filled 2016-07-08: qty 2

## 2016-07-08 MED ORDER — EPHEDRINE SULFATE 50 MG/ML IJ SOLN
INTRAMUSCULAR | Status: AC
  Administered 2016-07-08: 15 mg via INTRAVENOUS
  Filled 2016-07-08: qty 1

## 2016-07-08 MED ORDER — SODIUM CHLORIDE 0.9 % IV SOLN
INTRAVENOUS | Status: DC
  Administered 2016-07-08 (×2): via INTRAVENOUS

## 2016-07-08 MED ORDER — LIDOCAINE HCL (PF) 1 % IJ SOLN
INTRAMUSCULAR | Status: AC
  Filled 2016-07-08: qty 30

## 2016-07-08 SURGICAL SUPPLY — 47 items
ADHESIVE MASTISOL STRL (MISCELLANEOUS) ×3 IMPLANT
APPLIER CLIP ROT 10 11.4 M/L (STAPLE) ×3
BAG COUNTER SPONGE EZ (MISCELLANEOUS) IMPLANT
BLADE SURG SZ11 CARB STEEL (BLADE) ×3 IMPLANT
CANISTER SUCT 1200ML W/VALVE (MISCELLANEOUS) ×3 IMPLANT
CATH CHOLANG 76X19 KUMAR (CATHETERS) IMPLANT
CHLORAPREP W/TINT 26ML (MISCELLANEOUS) ×3 IMPLANT
CLIP APPLIE ROT 10 11.4 M/L (STAPLE) ×1 IMPLANT
CLOSURE WOUND 1/2 X4 (GAUZE/BANDAGES/DRESSINGS)
CONRAY 60ML FOR OR (MISCELLANEOUS) IMPLANT
COUNTER SPONGE BAG EZ (MISCELLANEOUS)
DRAPE SHEET LG 3/4 BI-LAMINATE (DRAPES) ×3 IMPLANT
DRESSING TELFA 4X3 1S ST N-ADH (GAUZE/BANDAGES/DRESSINGS) ×3 IMPLANT
DRSG TEGADERM 2-3/8X2-3/4 SM (GAUZE/BANDAGES/DRESSINGS) ×12 IMPLANT
ELECT REM PT RETURN 9FT ADLT (ELECTROSURGICAL) ×3
ELECTRODE REM PT RTRN 9FT ADLT (ELECTROSURGICAL) ×1 IMPLANT
GLOVE BIO SURGEON STRL SZ7.5 (GLOVE) ×3 IMPLANT
GLOVE INDICATOR 8.0 STRL GRN (GLOVE) ×3 IMPLANT
GOWN STRL REUS W/ TWL LRG LVL3 (GOWN DISPOSABLE) ×4 IMPLANT
GOWN STRL REUS W/TWL LRG LVL3 (GOWN DISPOSABLE) ×8
GRASPER SUT TROCAR 14GX15 (MISCELLANEOUS) ×3 IMPLANT
HEMOSTAT APPLICATOR AVITENE (MISCELLANEOUS) ×3 IMPLANT
HEMOSTAT SURGICEL 2X3 (HEMOSTASIS) ×3 IMPLANT
IRRIGATION STRYKERFLOW (MISCELLANEOUS) ×1 IMPLANT
IRRIGATOR STRYKERFLOW (MISCELLANEOUS) ×3
IV NS 1000ML (IV SOLUTION) ×6
IV NS 1000ML BAXH (IV SOLUTION) ×3 IMPLANT
IV NS 500ML (IV SOLUTION) ×2
IV NS 500ML BAXH (IV SOLUTION) ×1 IMPLANT
L-HOOK LAP DISP 36CM (ELECTROSURGICAL) ×3
LABEL OR SOLS (LABEL) IMPLANT
LHOOK LAP DISP 36CM (ELECTROSURGICAL) ×1 IMPLANT
NEEDLE HYPO 25X1 1.5 SAFETY (NEEDLE) ×3 IMPLANT
NEEDLE VERESS 14GA 120MM (NEEDLE) ×3 IMPLANT
NS IRRIG 500ML POUR BTL (IV SOLUTION) ×3 IMPLANT
PACK LAP CHOLECYSTECTOMY (MISCELLANEOUS) ×3 IMPLANT
PENCIL ELECTRO HAND CTR (MISCELLANEOUS) ×3 IMPLANT
POUCH ENDO CATCH 10MM SPEC (MISCELLANEOUS) ×3 IMPLANT
SCISSORS METZENBAUM CVD 33 (INSTRUMENTS) ×3 IMPLANT
SLEEVE ENDOPATH XCEL 5M (ENDOMECHANICALS) ×6 IMPLANT
STRIP CLOSURE SKIN 1/2X4 (GAUZE/BANDAGES/DRESSINGS) IMPLANT
SUT MNCRL 4-0 (SUTURE) ×2
SUT MNCRL 4-0 27XMFL (SUTURE) ×1
SUTURE MNCRL 4-0 27XMF (SUTURE) ×1 IMPLANT
TROCAR XCEL 12X100 BLDLESS (ENDOMECHANICALS) ×3 IMPLANT
TROCAR XCEL NON-BLD 5MMX100MML (ENDOMECHANICALS) ×3 IMPLANT
TUBING INSUFFLATOR HI FLOW (MISCELLANEOUS) ×3 IMPLANT

## 2016-07-08 NOTE — Op Note (Signed)
Laparoscopic Cholecystectomy  Pre-operative Diagnosis: Biliary dyskinesia  Post-operative Diagnosis: Chronic cholecystitis  Procedure: Laparoscopic cholecystectomy  Surgeon: Juanda Crumble T. Adonis Huguenin, MD FACS  Anesthesia: Gen. with endotracheal tube  Assistant: None  Procedure Details  The patient was seen again in the Holding Room. The benefits, complications, treatment options, and expected outcomes were discussed with the patient. The risks of bleeding, infection, recurrence of symptoms, failure to resolve symptoms, bile duct damage, bile duct leak, retained common bile duct stone, bowel injury, any of which could require further surgery and/or ERCP, stent, or papillotomy were reviewed with the patient. The likelihood of improving the patient's symptoms with return to their baseline status is good.  The patient and/or family concurred with the proposed plan, giving informed consent.  The patient was taken to Operating Room, identified as Katie [name] and the procedure verified as Laparoscopic Cholecystectomy.  A Time Out was held and the above information confirmed.  Prior to the induction of general anesthesia, antibiotic prophylaxis was administered. VTE prophylaxis was in place. General endotracheal anesthesia was then administered and tolerated well. After the induction, the abdomen was prepped with Chloraprep and draped in the sterile fashion. The patient was positioned in the supine position.  Local anesthetic  was injected into the skin near the umbilicus and an incision made. The Veress needle was placed. Pneumoperitoneum was then created with CO2 and tolerated well without any adverse changes in the patient's vital signs. A 32mm port was placed in the periumbilical position and the abdominal cavity was explored.  Two 5-mm ports were placed in the right upper quadrant and a 12 mm epigastric port was placed all under direct vision. All skin incisions  were infiltrated with a local anesthetic  agent before making the incision and placing the trocars.   The patient was positioned  in reverse Trendelenburg, tilted slightly to the patient's left.  The gallbladder was identified, the fundus grasped and retracted cephalad. Patient was noted to have extensive adhesions over the entire body of the gallbladder. Adhesions were lysed bluntly. The infundibulum was grasped and retracted laterally, exposing the peritoneum overlying the triangle of Calot. This was then divided and exposed in a blunt fashion. A critical view of the cystic duct and cystic artery was obtained.  The cystic duct was clearly identified and bluntly dissected. With the view of safety obtained the duct and the artery were serially clipped with endoclips and cut in between with Endo Shears.  The gallbladder was taken from the gallbladder fossa in a retrograde fashion with the electrocautery. The gallbladder was removed and placed in an Endocatch bag. The gallbladder and Endocatch sac were then removed through the epigastric port site. The liver bed was irrigated and inspected. Hemostasis was not achieved with the electrocautery. After multiple attempts electrocautery Surgicel was inserted of the abdomen and pressure was held for 5 minutes. The Surgicel was then removed however noted to be continued oozing from the liver bed. Cautery was again attempted for hemostasis along with copious irrigation. Due to continued oozing endoscopic Avitene was brought to the field and placed in the liver bed. Pressure was again held for 5 minutes. After 5 minutes there was no visualized continued bleeding from the liver bed. Copious irrigation was utilized and was repeatedly aspirated until clear.   Inspection of the right upper quadrant was performed. No bleeding, bile duct injury or leak, or bowel injury was noted. Pneumoperitoneum was released. 4-0 subcuticular Monocryl was used to close the skin. Steristrips and International aid/development worker and  sterile dressings were   applied.  The patient was then extubated and brought to the recovery room in stable condition. Sponge, lap, and needle counts were correct at closure and at the conclusion of the case.   Findings: Chronic Cholecystitis   Estimated Blood Loss: 100 mL         Drains: None         Specimens: Gallbladder           Complications: none               Smantha Boakye T. Adonis Huguenin, MD, FACS

## 2016-07-08 NOTE — Brief Op Note (Signed)
07/08/2016  3:46 PM  PATIENT:  Katie [name]  70 y.o. female  PRE-OPERATIVE DIAGNOSIS:  ACUTE CHOLECYSTIITIS  POST-OPERATIVE DIAGNOSIS:  ACUTE CHOLECYSTIITIS  PROCEDURE:  Procedure(s): LAPAROSCOPIC CHOLECYSTECTOMY (N/A)  SURGEON:  Surgeon(s) and Role:    * [name], MD - Primary  PHYSICIAN ASSISTANT:   ASSISTANTS: none   ANESTHESIA:   general  EBL:  Total I/O In: 700 [I.V.:700] Out: 15 [Blood:15]  BLOOD ADMINISTERED:none  DRAINS: none   LOCAL MEDICATIONS USED:  MARCAINE   , XYLOCAINE  and Amount: 24 ml  SPECIMEN:  Source of Specimen:  gallbladder  DISPOSITION OF SPECIMEN:  PATHOLOGY  COUNTS:  YES  TOURNIQUET:  * No tourniquets in log *  DICTATION: .Dragon Dictation  PLAN OF CARE: Discharge to home after PACU  PATIENT DISPOSITION:  PACU - hemodynamically stable.   Delay start of Pharmacological VTE agent (>24hrs) due to surgical blood loss or risk of bleeding: not applicable

## 2016-07-08 NOTE — Interval H&P Note (Signed)
History and Physical Interval Note:  07/08/2016 1:58 PM  Katie [name]  has presented today for surgery, with the diagnosis of ACUTE CHOLECYSTIITIS  The various methods of treatment have been discussed with the patient and family. After consideration of risks, benefits and other options for treatment, the patient has consented to  Procedure(s): LAPAROSCOPIC CHOLECYSTECTOMY (N/A) as a surgical intervention .  The patient's history has been reviewed, patient examined, no change in status, stable for surgery.  I have reviewed the patient's chart and labs.  Questions were answered to the patient's satisfaction.     [name]

## 2016-07-08 NOTE — Transfer of Care (Signed)
Immediate Anesthesia Transfer of Care Note  Patient: Katie [name]  Procedure(s) Performed: Procedure(s): LAPAROSCOPIC CHOLECYSTECTOMY (N/A)  Patient Location: PACU  Anesthesia Type:General  Level of Consciousness: pateint uncooperative, lethargic and responds to stimulation  Airway & Oxygen Therapy: Patient Spontanous Breathing and Patient connected to face mask oxygen  Post-op Assessment: Report given to RN and Post -op Vital signs reviewed and stable  Post vital signs: Reviewed and stable  Last Vitals:  Vitals:   07/08/16 1245 07/08/16 1551  BP: 136/64 (!) 125/56  Pulse: 68 74  Resp: 16 (!) 23  Temp: 36.4 C 36.9 C    Last Pain:  Vitals:   07/08/16 1245  TempSrc: Tympanic         Complications: No apparent anesthesia complications

## 2016-07-08 NOTE — Anesthesia Preprocedure Evaluation (Addendum)
Anesthesia Evaluation  Patient identified by MRN, date of birth, ID band Patient awake    Reviewed: Allergy & Precautions, NPO status , Patient's Chart, lab work & pertinent test results, reviewed documented beta blocker date and time   Airway Mallampati: II  TM Distance: >3 FB     Dental  (+) Chipped   Pulmonary           Cardiovascular hypertension, Pt. on medications      Neuro/Psych    GI/Hepatic GERD  Controlled,  Endo/Other  diabetes, Type 2  Renal/GU      Musculoskeletal  (+) Arthritis ,   Abdominal   Peds  Hematology  (+) anemia ,   Anesthesia Other Findings EKG ok.  Reproductive/Obstetrics                            Anesthesia Physical Anesthesia Plan  ASA: III  Anesthesia Plan: General   Post-op Pain Management:    Induction: Intravenous  Airway Management Planned: Oral ETT  Additional Equipment:   Intra-op Plan:   Post-operative Plan:   Informed Consent: I have reviewed the patients History and Physical, chart, labs and discussed the procedure including the risks, benefits and alternatives for the proposed anesthesia with the patient or authorized representative who has indicated his/her understanding and acceptance.     Plan Discussed with: CRNA  Anesthesia Plan Comments:         Anesthesia Quick Evaluation

## 2016-07-08 NOTE — Anesthesia Postprocedure Evaluation (Signed)
Anesthesia Post Note  Patient: Katie [name]  Procedure(s) Performed: Procedure(s) (LRB): LAPAROSCOPIC CHOLECYSTECTOMY (N/A)  Patient location during evaluation: PACU Anesthesia Type: General Level of consciousness: awake and alert and oriented Pain management: pain level controlled Vital Signs Assessment: post-procedure vital signs reviewed and stable Respiratory status: spontaneous breathing Cardiovascular status: blood pressure returned to baseline Anesthetic complications: no    Last Vitals:  Vitals:   07/08/16 1704 07/08/16 1720  BP: (!) 119/54 (!) 115/52  Pulse: (!) 59 (!) 58  Resp: 11 16  Temp:  36.9 C    Last Pain:  Vitals:   07/08/16 1720  TempSrc: Temporal  PainSc: 3                  Jenaro Souder

## 2016-07-08 NOTE — Discharge Instructions (Signed)
AMBULATORY SURGERY  DISCHARGE INSTRUCTIONS   1) The drugs that you were given will stay in your system until tomorrow so for the next 24 hours you should not:  A) Drive an automobile B) Make any legal decisions C) Drink any alcoholic beverage   2) You may resume regular meals tomorrow.  Today it is better to start with liquids and gradually work up to solid foods.  You may eat anything you prefer, but it is better to start with liquids, then soup and crackers, and gradually work up to solid foods.   3) Please notify your doctor immediately if you have any unusual bleeding, trouble breathing, redness and pain at the surgery site, drainage, fever, or pain not relieved by medication.    4) Additional Instructions:        Please contact your physician with any problems or Same Day Surgery at [phone], Monday through Friday 6 am to 4 pm, or [place] at [facility] number at [phone].Laparoscopic Cholecystectomy, Care After Refer to this sheet in the next few weeks. These instructions provide you with information about caring for yourself after your procedure. Your health care provider may also give you more specific instructions. Your treatment has been planned according to current medical practices, but problems sometimes occur. Call your health care provider if you have any problems or questions after your procedure. WHAT TO EXPECT AFTER THE PROCEDURE After your procedure, it is common to have:  Pain at your incision sites. You will be given pain medicines to control your pain.  Mild nausea or vomiting. This should improve after the first 24 hours.  Bloating and possible shoulder pain from the gas that was used during the procedure. This will improve after the first 24 hours. HOME CARE INSTRUCTIONS Incision Care  Follow instructions from your health care provider about how to take care of your incisions. Make sure you:  Wash your hands with soap and water before  you change your bandage (dressing). If soap and water are not available, use hand sanitizer.  Change your dressing as told by your health care provider.  Leave stitches (sutures), skin glue, or adhesive strips in place. These skin closures may need to be in place for 2 weeks or longer. If adhesive strip edges start to loosen and curl up, you may trim the loose edges. Do not remove adhesive strips completely unless your health care provider tells you to do that.  Do not take baths, swim, or use a hot tub until your health care provider approves. Ask your health care provider if you can take showers. You may only be allowed to take sponge baths for bathing. General Instructions  Take over-the-counter and prescription medicines only as told by your health care provider.  Do not drive or operate heavy machinery while taking prescription pain medicine.  Return to your normal diet as told by your health care provider.  Do not lift anything that is heavier than 10 lb (4.5 kg).  Do not play contact sports for one week or until your health care provider approves. SEEK MEDICAL CARE IF:   You have redness, swelling, or pain at the site of your incision.  You have fluid, blood, or pus coming from your incision.  You notice a bad smell coming from your incision area.  Your surgical incisions break open.  You have a fever. SEEK IMMEDIATE MEDICAL CARE IF:  You develop a rash.  You have difficulty breathing.  You have chest pain.  You  have increasing pain in your shoulders (shoulder strap areas).  You faint or have dizzy episodes while you are standing.  You have severe pain in your abdomen.  You have nausea or vomiting that lasts for more than one day.   This information is not intended to replace advice given to you by your health care provider. Make sure you discuss any questions you have with your health care provider.   Document Released: 08/12/2005 Document Revised: 05/03/2015  Document Reviewed: 03/24/2013 Elsevier Interactive Patient Education [employer].

## 2016-07-08 NOTE — H&P (View-Only) (Signed)
Patient ID: Katie [name], female   DOB: Sep 21, 1945, 70 y.o.   MRN: [id number]  CC: RUQ PAIN  HPI Katie [name] is a 70 y.o. female who presents to clinic today for evaluation of right upper quadrant abdominal pain. Patient reports that for the last 2 months she has had some constant pain with intermittent sharp stabbing pains and her right upper quadrant. This is associated with bloating and stools that are lighter in color and chalky per the patient. Patient reports that eating makes the pain worse especially if she eats fried food. This is never happened before. The pain is described as a sharp, stabbing sensation when it is at its worst. She does report some nausea in the past but none currently. She denies any fevers, chills, chest pain, shortness of breath, constipation.  HPI  Past Medical History:  Diagnosis Date  . Allergy   . Anemia   . Blood transfusion without reported diagnosis   . Diverticulitis   . DJD (degenerative joint disease)   . DM type 2 (diabetes mellitus, type 2) ([place])   . Fibrocystic breast disease   . GERD (gastroesophageal reflux disease)   . History of chicken pox   . Hypercholesteremia   . Hypertension   . Lichen planopilaris   . Urinary incontinence     Past Surgical History:  Procedure Laterality Date  . BREAST SURGERY  02/1975   Left Breast Biopsy-Fibrocystic Disease  . BREAST SURGERY  10/1981   Bialteral Capsulectomy Breast Surgery  . BREAST SURGERY  07/1998   Replace Bilateral Silicone Implants  . BREAST SURGERY  01/1976   Bilater breast surgery to remove fibrocystic tissue and replace with silicone implants  . DILATION AND CURETTAGE OF UTERUS  01/1994  . FINGER SURGERY  2001 & 2003   Trigger Finger  . FOOT SURGERY  1980   To Relieve Pinched Nerve  . FOOT SURGERY  07/2008   Left Foot-Plantar Fasciitis  . JOINT REPLACEMENT  04/19/2013   Hip Replacement-Right  . PLACEMENT OF BREAST IMPLANTS  12/26/2004   Replace Failed Implants  .  RHINOPLASTY  03/1964   To Correct Deviated Septum    Family History  Problem Relation Age of Onset  . Arthritis Mother   . Hypertension Mother   . Heart disease Father   . Diabetes Father   . Breast cancer Sister   . Hypertension Brother   . Leukemia Brother   . Coronary artery disease Brother     Social History Social History  Substance Use Topics  . Smoking status: Never Smoker  . Smokeless tobacco: Never Used  . Alcohol use No    Allergies  Allergen Reactions  . Ciprofloxacin     REACTION: rash  . Daypro [Oxaprozin]   . Enalapril Maleate Nausea Only  . Erythromycin     REACTION: Rash, hives  . Nsaids   . Vioxx [Rofecoxib]     Current Outpatient Prescriptions  Medication Sig Dispense Refill  . atorvastatin (LIPITOR) 10 MG tablet Take 1 tablet (10 mg total) by mouth daily. 30 tablet 11  . azelastine (ASTELIN) 137 MCG/SPRAY nasal spray Place 1 spray into the nose 2 (two) times daily. Use in each nostril as directed    . Blood Glucose Monitoring Suppl (ONE TOUCH ULTRA 2) w/Device KIT Use to check blood sugar daily.  E11.9 1 each 0  . Calcium-Vitamin D-Vitamin K (VIACTIV) 606-301-60 MG-UNT-MCG CHEW Chew 1 each by mouth daily.    . clobetasol (TEMOVATE) 0.05 %  external solution Apply 1 application topically. Reported on 08/16/2015    . fluticasone (FLONASE) 50 MCG/ACT nasal spray Place 2 sprays into both nostrils daily. 16 g 1  . glucose blood test strip ONETOUCH ULTRA 2 TEST STRIPS.  Use to check blood sugar daily.  E11.9 100 each 3  . hydrochlorothiazide (HYDRODIURIL) 25 MG tablet Take 1 tablet (25 mg total) by mouth daily. 30 tablet 11  . Lancets (ONETOUCH ULTRASOFT) lancets Use to check blood sugar daily.  E11.9 100 each 3  . loratadine (CLARITIN) 10 MG tablet Take 10 mg by mouth daily.    [name] losartan (COZAAR) 100 MG tablet Take 1 tablet (100 mg total) by mouth daily. 30 tablet 11  . metFORMIN (GLUCOPHAGE) 500 MG tablet Take 1 tablet (500 mg total) by mouth daily. 30  tablet 11   No current facility-administered medications for this visit.      Review of Systems A Multi-point review of systems was asked and was negative except for the findings documented in the history of present illness  Physical Exam Blood pressure (!) 152/66, pulse 76, temperature 97.8 F (36.6 C), temperature source Oral, height 5' 2.5" (1.588 m), weight 77.1 kg (170 lb). CONSTITUTIONAL: No acute distress. EYES: Pupils are equal, round, and reactive to light, Sclera are non-icteric. EARS, NOSE, MOUTH AND THROAT: The oropharynx is clear. The oral mucosa is pink and moist. Hearing is intact to voice. LYMPH NODES:  Lymph nodes in the neck are normal. RESPIRATORY:  Lungs are clear. There is normal respiratory effort, with equal breath sounds bilaterally, and without pathologic use of accessory muscles. CARDIOVASCULAR: Heart is regular without murmurs, gallops, or rubs. GI: The abdomen is soft, minimally tender to deep palpation in the right upper quadrant but with a negative Murphy sign and without any rebound or guarding, and nondistended. There are no palpable masses. There is no hepatosplenomegaly. There are normal bowel sounds in all quadrants. GU: Rectal deferred.   MUSCULOSKELETAL: Normal muscle strength and tone. No cyanosis or edema.   SKIN: Turgor is good and there are no pathologic skin lesions or ulcers. NEUROLOGIC: Motor and sensation is grossly normal. Cranial nerves are grossly intact. PSYCH:  Oriented to person, place and time. Affect is normal.  Data Reviewed Images and labs reviewed labs that were performed last month are all within normal limits. Ultrasound of the right upper quadrant showed some potential sludge but no evidence of gallstones and without any gallbladder wall thickening, ductal dilatation, pericholecystic fluid. A HIDA scan was then obtained which showed a decreased ejection fraction to 25%. Patient reports that the HIDA scan brought on her symptoms. I  have personally reviewed the patient's imaging, laboratory findings and medical records.    Assessment    Biliary dyskinesia    Plan    70 year old female process clinic for evaluation of right upper quadrant pain with a workup consistent with biliary dyskinesia. Discussed with her the diagnosis of biliary dyskinesia in detail to include the likely benefits of a laparoscopic cholecystectomy. I discussed the procedure in detail.  The patient was given Neurosurgeon.  We discussed the risks and benefits of a laparoscopic cholecystectomy and possible cholangiogram including, but not limited to bleeding, infection, injury to surrounding structures such as the intestine or liver, bile leak, retained gallstones, need to convert to an open procedure, prolonged diarrhea, blood clots such as  DVT, common bile duct injury, anesthesia risks, and possible need for additional procedures.  The likelihood of improvement in symptoms and  return to the patient's normal status is good. We discussed the typical post-operative recovery course. Patient voiced understanding and desired to proceed. She is concerned about the timing of surgery as she is leaving on a cruise on November 28 and will not be back for approximately 1 month. Given this we'll plan to perform surgery on November 13 so that she can be mostly recovered prior to departure. All questions transient the patient's satisfaction.      Time spent with the patient was 45 minutes, with more than 50% of the time spent in face-to-face education, counseling and care coordination.     [name], MD FACS General Surgeon 06/26/2016, 12:16 PM

## 2016-07-08 NOTE — Anesthesia Procedure Notes (Signed)
Procedure Name: Intubation Performed by: Lance Muss Pre-anesthesia Checklist: Patient identified, Patient being monitored, Timeout performed, Emergency Drugs available and Suction available Patient Re-evaluated:Patient Re-evaluated prior to inductionOxygen Delivery Method: Circle system utilized Preoxygenation: Pre-oxygenation with 100% oxygen Intubation Type: IV induction Ventilation: Mask ventilation without difficulty Laryngoscope Size: Mac, 3 and Glidescope Grade View: Grade IV Tube type: Oral Tube size: 7.0 mm Number of attempts: 1 Airway Equipment and Method: Stylet Placement Confirmation: ETT inserted through vocal cords under direct vision,  positive ETCO2 and breath sounds checked- equal and bilateral Secured at: 21 cm Tube secured with: Tape Dental Injury: Teeth and Oropharynx as per pre-operative assessment  Difficulty Due To: Difficult Airway- due to anterior larynx and Difficult Airway- due to limited oral opening Future Recommendations: Recommend- induction with short-acting agent, and alternative techniques readily available Comments: Limited mouth opening and anterior larynx, grade 4 view with MAC 3 blade. Second attempt viewed with glidescope #3, grade 1 view. ETT passed through cords with + BBS, +ETCO2.

## 2016-07-09 ENCOUNTER — Encounter: Payer: Self-pay | Admitting: General Surgery

## 2016-07-10 ENCOUNTER — Telehealth: Payer: Self-pay | Admitting: Surgery

## 2016-07-10 NOTE — Telephone Encounter (Signed)
Call made to patient at this time. Spoke with patient. Explained that she may take original dressing off but if steri-strips are present they need to stay on until Post-op appointment. Instructed patient to shower with soap and water to area to help relieve the itching. Asked her to call back with any further questions or concerns.

## 2016-07-10 NOTE — Telephone Encounter (Signed)
Bandage is making skin red underneath. Can they take the outside bandage off and replace it with a new one. Surgery was 11/13 gallbladder. Everything is looking fine

## 2016-07-11 ENCOUNTER — Other Ambulatory Visit: Payer: Self-pay | Admitting: Family Medicine

## 2016-07-11 DIAGNOSIS — L661 Lichen planopilaris: Secondary | ICD-10-CM | POA: Diagnosis not present

## 2016-07-11 LAB — SURGICAL PATHOLOGY

## 2016-07-22 ENCOUNTER — Ambulatory Visit (INDEPENDENT_AMBULATORY_CARE_PROVIDER_SITE_OTHER): Payer: Medicare Other | Admitting: Surgery

## 2016-07-22 ENCOUNTER — Encounter: Payer: Self-pay | Admitting: Surgery

## 2016-07-22 VITALS — BP 144/65 | HR 75 | Temp 97.7°F | Ht 62.5 in | Wt 168.0 lb

## 2016-07-22 DIAGNOSIS — K811 Chronic cholecystitis: Secondary | ICD-10-CM

## 2016-07-22 DIAGNOSIS — K828 Other specified diseases of gallbladder: Secondary | ICD-10-CM

## 2016-07-22 NOTE — Progress Notes (Signed)
Outpatient Surgical Follow Up  07/22/2016  Katie [name] is an 70 y.o. female seen for the diagnosis of Biliary dyskinesia [K82.8].  HPI: Patient seen and examined in clinic. They are POD 14  From Lap chole with Dr Adonis Huguenin. Overall she is doing well with no acute complaints. she denies N/V, D/C, fevers or malaise. Reports compliance with post-op instructions and has been doing wound care appropriately. She states that she is having good stools which occasionally are loose. She has been avoiding any fatty foods. She states her pain has resolved and she is no longer taking pain medication.  Past Medical History:  Diagnosis Date  . Allergy   . Anemia   . Blood transfusion without reported diagnosis   . Diverticulitis   . DJD (degenerative joint disease)   . DM type 2 (diabetes mellitus, type 2) ([place])   . Fibrocystic breast disease   . GERD (gastroesophageal reflux disease)   . History of chicken pox   . Hypercholesteremia   . Hypertension   . Lichen planopilaris   . Urinary incontinence     Past Surgical History:  Procedure Laterality Date  . BREAST SURGERY  02/1975   Left Breast Biopsy-Fibrocystic Disease  . BREAST SURGERY  10/1981   Bialteral Capsulectomy Breast Surgery  . BREAST SURGERY  07/1998   Replace Bilateral Silicone Implants  . BREAST SURGERY  01/1976   Bilater breast surgery to remove fibrocystic tissue and replace with silicone implants  . CHOLECYSTECTOMY N/A 07/08/2016   Procedure: LAPAROSCOPIC CHOLECYSTECTOMY;  Surgeon: [name], MD;  Location: [facility] ORS;  Service: General;  Laterality: N/A;  . DILATION AND CURETTAGE OF UTERUS  01/1994  . FINGER SURGERY  2001 & 2003   Trigger Finger  . FOOT SURGERY  1980   To Relieve Pinched Nerve  . FOOT SURGERY  07/2008   Left Foot-Plantar Fasciitis  . JOINT REPLACEMENT  04/19/2013   Hip Replacement-Right  . PLACEMENT OF BREAST IMPLANTS  12/26/2004   Replace Failed Implants  . RHINOPLASTY  03/1964   To Correct  Deviated Septum    Family History  Problem Relation Age of Onset  . Arthritis Mother   . Hypertension Mother   . Heart disease Father   . Diabetes Father   . Breast cancer Sister   . Hypertension Brother   . Leukemia Brother   . Coronary artery disease Brother     Social History:  reports that she has never smoked. She has never used smokeless tobacco. She reports that she does not drink alcohol or use drugs.  Allergies:  Allergies  Allergen Reactions  . Ciprofloxacin     REACTION: rash  . Daypro [Oxaprozin]   . Enalapril Maleate Nausea Only  . Erythromycin     REACTION: Rash, hives  . Nsaids   . Vioxx [Rofecoxib]     Medications reviewed.  Physical Exam:  BP (!) 144/65   Pulse 75   Temp 97.7 F (36.5 C) (Oral)   Ht 5' 2.5" (1.588 m)   Wt 168 lb (76.2 kg)   BMI 30.24 kg/m   Gen: patient resting comfortably in clinic, no cardiovascular or respiratory distress Abd/GI: soft, non tender, incisions c/d/i, no erythema or drainage  No results found for this or any previous visit (from the past 48 hour(s)). No results found.  Assessment/Plan: Katie [name] is an 70 y.o. female seen for the diagnosis of Biliary dyskinesia [K82.8]. Progressing as expected. We discussed that she is free to go on  her cruise tomorrow. Patient may shower or bathe in bathtub and slim at this time. Patient also can last over 15 pounds however you some discretion and if it hurts do not lift heavy suitcases. Patient was given opportunity to ask questions and have them answered. She may follow-up when necessary any questions or concerns.  Kielan Dreisbach L. Zayvon Alicea MD General Surgeon  07/22/2016,9:31 AM

## 2016-08-04 DIAGNOSIS — N390 Urinary tract infection, site not specified: Secondary | ICD-10-CM | POA: Diagnosis not present

## 2016-08-04 DIAGNOSIS — J069 Acute upper respiratory infection, unspecified: Secondary | ICD-10-CM | POA: Diagnosis not present

## 2016-08-22 DIAGNOSIS — E119 Type 2 diabetes mellitus without complications: Secondary | ICD-10-CM | POA: Diagnosis not present

## 2016-08-22 LAB — HM DIABETES EYE EXAM

## 2016-08-23 ENCOUNTER — Encounter: Payer: Self-pay | Admitting: Family Medicine

## 2016-08-23 DIAGNOSIS — M85852 Other specified disorders of bone density and structure, left thigh: Secondary | ICD-10-CM | POA: Diagnosis not present

## 2016-08-23 DIAGNOSIS — Z1231 Encounter for screening mammogram for malignant neoplasm of breast: Secondary | ICD-10-CM | POA: Diagnosis not present

## 2016-08-23 DIAGNOSIS — Z803 Family history of malignant neoplasm of breast: Secondary | ICD-10-CM | POA: Diagnosis not present

## 2016-08-27 ENCOUNTER — Encounter: Payer: Self-pay | Admitting: Family Medicine

## 2016-09-02 ENCOUNTER — Encounter: Payer: Self-pay | Admitting: Family Medicine

## 2016-09-05 ENCOUNTER — Encounter: Payer: Self-pay | Admitting: Family Medicine

## 2016-09-12 DIAGNOSIS — L661 Lichen planopilaris: Secondary | ICD-10-CM | POA: Diagnosis not present

## 2016-09-26 ENCOUNTER — Encounter: Payer: Self-pay | Admitting: Family Medicine

## 2016-10-08 ENCOUNTER — Other Ambulatory Visit: Payer: Medicare Other

## 2016-10-08 ENCOUNTER — Telehealth: Payer: Self-pay | Admitting: Family Medicine

## 2016-10-08 ENCOUNTER — Ambulatory Visit (INDEPENDENT_AMBULATORY_CARE_PROVIDER_SITE_OTHER): Payer: Medicare Other

## 2016-10-08 VITALS — BP 136/70 | HR 78 | Temp 97.9°F | Ht 62.25 in | Wt 170.2 lb

## 2016-10-08 DIAGNOSIS — E7800 Pure hypercholesterolemia, unspecified: Secondary | ICD-10-CM

## 2016-10-08 DIAGNOSIS — E119 Type 2 diabetes mellitus without complications: Secondary | ICD-10-CM

## 2016-10-08 DIAGNOSIS — I10 Essential (primary) hypertension: Secondary | ICD-10-CM | POA: Diagnosis not present

## 2016-10-08 DIAGNOSIS — E559 Vitamin D deficiency, unspecified: Secondary | ICD-10-CM | POA: Diagnosis not present

## 2016-10-08 DIAGNOSIS — Z0000 Encounter for general adult medical examination without abnormal findings: Secondary | ICD-10-CM

## 2016-10-08 LAB — LIPID PANEL
CHOL/HDL RATIO: 5
CHOLESTEROL: 180 mg/dL (ref 0–200)
HDL: 38.8 mg/dL — ABNORMAL LOW (ref >39.00)
NONHDL: 141.27
Triglycerides: 286 mg/dL — ABNORMAL HIGH (ref 0.0–149.0)
VLDL: 57.2 mg/dL — AB (ref 0.0–40.0)

## 2016-10-08 LAB — COMPREHENSIVE METABOLIC PANEL
ALT: 21 U/L (ref 0–35)
AST: 19 U/L (ref 0–37)
Albumin: 4.2 g/dL (ref 3.5–5.2)
Alkaline Phosphatase: 67 U/L (ref 39–117)
BILIRUBIN TOTAL: 0.5 mg/dL (ref 0.2–1.2)
BUN: 20 mg/dL (ref 6–23)
CHLORIDE: 104 meq/L (ref 96–112)
CO2: 28 meq/L (ref 19–32)
CREATININE: 0.89 mg/dL (ref 0.40–1.20)
Calcium: 9.5 mg/dL (ref 8.4–10.5)
GFR: 66.29 mL/min (ref >60.00)
GLUCOSE: 145 mg/dL — AB (ref 70–99)
Potassium: 4.2 mEq/L (ref 3.5–5.1)
SODIUM: 138 meq/L (ref 135–145)
Total Protein: 7.1 g/dL (ref 6.0–8.3)

## 2016-10-08 LAB — HEMOGLOBIN A1C: Hgb A1c MFr Bld: 6.5 % (ref 4.6–6.5)

## 2016-10-08 LAB — VITAMIN D 25 HYDROXY (VIT D DEFICIENCY, FRACTURES): VITD: 21.7 ng/mL — ABNORMAL LOW (ref 30.00–100.00)

## 2016-10-08 LAB — TSH: TSH: 2.35 u[IU]/mL (ref 0.35–4.50)

## 2016-10-08 LAB — LDL CHOLESTEROL, DIRECT: Direct LDL: 95 mg/dL

## 2016-10-08 NOTE — Progress Notes (Signed)
I reviewed health advisor's note, was available for consultation, and agree with documentation and plan.  

## 2016-10-08 NOTE — Telephone Encounter (Signed)
Labs already ordered

## 2016-10-08 NOTE — Telephone Encounter (Signed)
-----   Message from Ellamae Sia sent at 09/24/2016  2:56 PM EST ----- Regarding: Lab orders for Tuesday, 2.13.18 Patient is scheduled for CPX labs, please order future labs, Thanks , Karna Christmas

## 2016-10-08 NOTE — Progress Notes (Signed)
PCP notes:   Health maintenance:  A1C - completed  Abnormal screenings:   None  Patient concerns:   None  Nurse concerns:  None  Next PCP appt:   10/11/2016 @ 1000

## 2016-10-08 NOTE — Patient Instructions (Signed)
Katie [name] , Thank you for taking time to come for your Medicare Wellness Visit. I appreciate your ongoing commitment to your health goals. Please review the following plan we discussed and let me know if I can assist you in the future.   These are the goals we discussed: Goals    . Increase physical activity          Starting 10/08/2016, I will continue to exercise at least 60 min 2-5 days per week.        This is a list of the screening recommended for you and due dates:  Health Maintenance  Topic Date Due  . Complete foot exam   04/05/2017  . Hemoglobin A1C  04/07/2017  . Eye exam for diabetics  08/22/2017  . Mammogram  08/23/2018  . Tetanus Vaccine  05/18/2019  . Colon Cancer Screening  07/11/2019  . Flu Shot  Completed  . DEXA scan (bone density measurement)  Completed  . Shingles Vaccine  Completed  .  Hepatitis C: One time screening is recommended by Center for Disease Control  (CDC) for  adults born from [birth year] through 1966.   Completed  . Pneumonia vaccines  Completed   Preventive Care for Adults  A healthy lifestyle and preventive care can promote health and wellness. Preventive health guidelines for adults include the following key practices.  . A routine yearly physical is a good way to check with your health care provider about your health and preventive screening. It is a chance to share any concerns and updates on your health and to receive a thorough exam.  . Visit your dentist for a routine exam and preventive care every 6 months. Brush your teeth twice a day and floss once a day. Good oral hygiene prevents tooth decay and gum disease.  . The frequency of eye exams is based on your age, health, family medical history, use  of contact lenses, and other factors. Follow your health care provider's ecommendations for frequency of eye exams.  . Eat a healthy diet. Foods like vegetables, fruits, whole grains, low-fat dairy products, and lean protein foods contain the  nutrients you need without too many calories. Decrease your intake of foods high in solid fats, added sugars, and salt. Eat the right amount of calories for you. Get information about a proper diet from your health care provider, if necessary.  . Regular physical exercise is one of the most important things you can do for your health. Most adults should get at least 150 minutes of moderate-intensity exercise (any activity that increases your heart rate and causes you to sweat) each week. In addition, most adults need muscle-strengthening exercises on 2 or more days a week.  Silver Sneakers may be a benefit available to you. To determine eligibility, you may visit the website: www.silversneakers.com or contact program at 210 299 1420 Mon-Fri between 8AM-8PM.   . Maintain a healthy weight. The body mass index (BMI) is a screening tool to identify possible weight problems. It provides an estimate of body fat based on height and weight. Your health care provider can find your BMI and can help you achieve or maintain a healthy weight.   For adults 20 years and older: ? A BMI below 18.5 is considered underweight. ? A BMI of 18.5 to 24.9 is normal. ? A BMI of 25 to 29.9 is considered overweight. ? A BMI of 30 and above is considered obese.   . Maintain normal blood lipids and cholesterol levels by exercising  and minimizing your intake of saturated fat. Eat a balanced diet with plenty of fruit and vegetables. Blood tests for lipids and cholesterol should begin at age 81 and be repeated every 5 years. If your lipid or cholesterol levels are high, you are over 50, or you are at high risk for heart disease, you may need your cholesterol levels checked more frequently. Ongoing high lipid and cholesterol levels should be treated with medicines if diet and exercise are not working.  . If you smoke, find out from your health care provider how to quit. If you do not use tobacco, please do not start.  . If you  choose to drink alcohol, please do not consume more than 2 drinks per day. One drink is considered to be 12 ounces (355 mL) of beer, 5 ounces (148 mL) of wine, or 1.5 ounces (44 mL) of liquor.  . If you are 44-72 years old, ask your health care provider if you should take aspirin to prevent strokes.  . Use sunscreen. Apply sunscreen liberally and repeatedly throughout the day. You should seek shade when your shadow is shorter than you. Protect yourself by wearing long sleeves, pants, a wide-brimmed hat, and sunglasses year round, whenever you are outdoors.  . Once a month, do a whole body skin exam, using a mirror to look at the skin on your back. Tell your health care provider of new moles, moles that have irregular borders, moles that are larger than a pencil eraser, or moles that have changed in shape or color.

## 2016-10-08 NOTE — Progress Notes (Signed)
Pre visit review using our clinic review tool, if applicable. No additional management support is needed unless otherwise documented below in the visit note. 

## 2016-10-08 NOTE — Progress Notes (Signed)
Subjective:   Katie [name] is a 71 y.o. female who presents for Medicare Annual (Subsequent) preventive examination.  Review of Systems:  N/A Cardiac Risk Factors include: advanced age (>14men, >31 women);diabetes mellitus;dyslipidemia;hypertension;obesity (BMI >30kg/m2)     Objective:     Vitals: BP 136/70 (BP Location: Left Arm, Patient Position: Sitting, Cuff Size: Normal)   Pulse 78   Temp 97.9 F (36.6 C) (Oral)   Ht 5' 2.25" (1.581 m) Comment: no shoes  Wt 170 lb 4 oz (77.2 kg)   SpO2 95%   BMI 30.89 kg/m   Body mass index is 30.89 kg/m.   Tobacco History  Smoking Status  . Never Smoker  Smokeless Tobacco  . Never Used     Counseling given: No   Past Medical History:  Diagnosis Date  . Allergy   . Anemia   . Blood transfusion without reported diagnosis   . Diverticulitis   . DJD (degenerative joint disease)   . DM type 2 (diabetes mellitus, type 2) ([place])   . Fibrocystic breast disease   . GERD (gastroesophageal reflux disease)   . History of chicken pox   . Hypercholesteremia   . Hypertension   . Lichen planopilaris   . Urinary incontinence    Past Surgical History:  Procedure Laterality Date  . BREAST SURGERY  02/1975   Left Breast Biopsy-Fibrocystic Disease  . BREAST SURGERY  10/1981   Bialteral Capsulectomy Breast Surgery  . BREAST SURGERY  07/1998   Replace Bilateral Silicone Implants  . BREAST SURGERY  01/1976   Bilater breast surgery to remove fibrocystic tissue and replace with silicone implants  . CHOLECYSTECTOMY N/A 07/08/2016   Procedure: LAPAROSCOPIC CHOLECYSTECTOMY;  Surgeon: [name], MD;  Location: [facility] ORS;  Service: General;  Laterality: N/A;  . DILATION AND CURETTAGE OF UTERUS  01/1994  . FINGER SURGERY  2001 & 2003   Trigger Finger  . FOOT SURGERY  1980   To Relieve Pinched Nerve  . FOOT SURGERY  07/2008   Left Foot-Plantar Fasciitis  . JOINT REPLACEMENT  04/19/2013   Hip Replacement-Right  . PLACEMENT OF BREAST  IMPLANTS  12/26/2004   Replace Failed Implants  . RHINOPLASTY  03/1964   To Correct Deviated Septum   Family History  Problem Relation Age of Onset  . Arthritis Mother   . Hypertension Mother   . Heart disease Father   . Diabetes Father   . Breast cancer Sister   . Hypertension Brother   . Leukemia Brother   . Coronary artery disease Brother    History  Sexual Activity  . Sexual activity: No    Outpatient Encounter Prescriptions as of 10/08/2016  Medication Sig  . atorvastatin (LIPITOR) 10 MG tablet Take 1 tablet (10 mg total) by mouth daily.  [name] azelastine (ASTELIN) 0.1 % nasal spray instill 1 spray into each nostril twice a day for 1 week then if needed  . Calcium-Vitamin D-Vitamin K (VIACTIV) [id number] MG-UNT-MCG CHEW Chew 1 each by mouth daily.  . clobetasol (TEMOVATE) 0.05 % external solution Apply 1 application topically. Reported on 08/16/2015  . fluticasone (FLONASE) 50 MCG/ACT nasal spray instill 2 sprays into each nostril once daily  . hydrochlorothiazide (HYDRODIURIL) 25 MG tablet Take 1 tablet (25 mg total) by mouth daily.  [name] loratadine (CLARITIN) 10 MG tablet Take 10 mg by mouth daily.  [name] losartan (COZAAR) 100 MG tablet Take 1 tablet (100 mg total) by mouth daily.  . metFORMIN (GLUCOPHAGE) 500 MG tablet Take  1 tablet (500 mg total) by mouth daily.  . metroNIDAZOLE (METROCREAM) 0.75 % cream Apply 1 application topically 2 (two) times daily as needed.  . tacrolimus (PROTOPIC) 0.1 % ointment Apply 1 application topically daily as needed.   No facility-administered encounter medications on file as of 10/08/2016.     Activities of Daily Living In your present state of health, do you have any difficulty performing the following activities: 10/08/2016 07/01/2016  Hearing? N N  Vision? N N  Difficulty concentrating or making decisions? N N  Walking or climbing stairs? N N  Dressing or bathing? N N  Doing errands, shopping? N N  Preparing Food and eating ? N -  Using  the Toilet? N -  In the past six months, have you accidently leaked urine? Y -  Do you have problems with loss of bowel control? N -  Managing your Medications? N -  Managing your Finances? N -  Housekeeping or managing your Housekeeping? N -  Some recent data might be hidden    Patient Care Team: [name], MD as PCP - General (Family Medicine) Birder [name], MD as Referring Physician (Ophthalmology) [name], MD as Consulting Physician (Dermatology) [name], MD as Consulting Physician (Orthopedic Surgery) [name], MD as Consulting Physician (General Surgery) [name], MD as Consulting Physician (Dentistry)    Assessment:     Hearing Screening   125Hz  250Hz  500Hz  1000Hz  2000Hz  3000Hz  4000Hz  6000Hz  8000Hz   Right ear:   40 40 40  40    Left ear:   40 40 40  40    Vision Screening Comments: Last vision exam in December 2017 with Dr. George Ina   Exercise Activities and Dietary recommendations Current Exercise Habits: Home exercise routine, Type of exercise: treadmill;Other - see comments (Nu-Step, stationary bike), Time (Minutes): 60, Frequency (Times/Week): 4, Weekly Exercise (Minutes/Week): 240, Intensity: Moderate, Exercise limited by: None identified  Goals    . Increase physical activity          Starting 10/08/2016, I will continue to exercise at least 60 min 2-5 days per week.       Fall Risk Fall Risk  10/08/2016 10/06/2015 10/04/2014  Falls in the past year? No No Yes  Number falls in past yr: - - 1  Injury with Fall? - - No   Depression Screen PHQ 2/9 Scores 10/08/2016 10/06/2015 10/04/2014  PHQ - 2 Score 0 0 0     Cognitive Function MMSE - Mini Mental State Exam 10/08/2016  Orientation to time 5  Orientation to Place 5  Registration 3  Attention/ Calculation 0  Recall 3  Language- name 2 objects 0  Language- repeat 1  Language- follow 3 step command 3  Language- read & follow direction 0  Write a sentence 0  Copy design 0    Total score 20     PLEASE NOTE: A Mini-Cog screen was completed. Maximum score is 20. A value of 0 denotes this part of Folstein MMSE was not completed or the patient failed this part of the Mini-Cog screening.   Mini-Cog Screening Orientation to Time - Max 5 pts Orientation to Place - Max 5 pts Registration - Max 3 pts Recall - Max 3 pts Language Repeat - Max 1 pts Language Follow 3 Step Command - Max 3 pts     Immunization History  Administered Date(s) Administered  . Hep A / Hep B 05/29/2009, 07/03/2009, 11/24/2009  . Influenza, High Dose Seasonal PF 04/13/2015  .  Influenza, Seasonal, Injecte, Preservative Fre 06/26/2016  . Influenza-Unspecified 05/17/2009, 04/02/2011, 05/20/2012, 07/01/2013, 04/18/2014  . Pneumococcal Conjugate-13 04/18/2014  . Pneumococcal Polysaccharide-23 10/06/2015  . Td 05/17/2009  . Typhoid Inactivated 05/17/2009  . Yellow Fever 05/17/2009  . Zoster 04/02/2011   Screening Tests Health Maintenance  Topic Date Due  . FOOT EXAM  04/05/2017  . HEMOGLOBIN A1C  04/07/2017  . OPHTHALMOLOGY EXAM  08/22/2017  . MAMMOGRAM  08/23/2018  . TETANUS/TDAP  05/18/2019  . COLONOSCOPY  07/11/2019  . INFLUENZA VACCINE  Completed  . DEXA SCAN  Completed  . ZOSTAVAX  Completed  . Hepatitis C Screening  Completed  . PNA vac Low Risk Adult  Completed      Plan:     I have personally reviewed and addressed the Medicare Annual Wellness questionnaire and have noted the following in the patient's chart:  A. Medical and social history B. Use of alcohol, tobacco or illicit drugs  C. Current medications and supplements D. Functional ability and status E.  Nutritional status F.  Physical activity G. Advance directives H. List of other physicians I.  Hospitalizations, surgeries, and ER visits in previous 12 months J.  [place] to include hearing, vision, cognitive, depression L. Referrals and appointments - none  In addition, I have reviewed and  discussed with patient certain preventive protocols, quality metrics, and best practice recommendations. A written personalized care plan for preventive services as well as general preventive health recommendations were provided to patient.  See attached scanned questionnaire for additional information.   Signed,   Lindell Noe, MHA, BS, LPN Health Coach

## 2016-10-09 ENCOUNTER — Other Ambulatory Visit: Payer: Self-pay | Admitting: Family Medicine

## 2016-10-11 ENCOUNTER — Ambulatory Visit (INDEPENDENT_AMBULATORY_CARE_PROVIDER_SITE_OTHER): Payer: Medicare Other | Admitting: Family Medicine

## 2016-10-11 ENCOUNTER — Encounter: Payer: Self-pay | Admitting: Family Medicine

## 2016-10-11 VITALS — BP 120/60 | HR 71 | Temp 97.5°F | Ht 62.25 in | Wt 170.5 lb

## 2016-10-11 DIAGNOSIS — N9089 Other specified noninflammatory disorders of vulva and perineum: Secondary | ICD-10-CM

## 2016-10-11 DIAGNOSIS — I10 Essential (primary) hypertension: Secondary | ICD-10-CM

## 2016-10-11 DIAGNOSIS — E7800 Pure hypercholesterolemia, unspecified: Secondary | ICD-10-CM

## 2016-10-11 DIAGNOSIS — R21 Rash and other nonspecific skin eruption: Secondary | ICD-10-CM | POA: Insufficient documentation

## 2016-10-11 DIAGNOSIS — E119 Type 2 diabetes mellitus without complications: Secondary | ICD-10-CM

## 2016-10-11 DIAGNOSIS — E559 Vitamin D deficiency, unspecified: Secondary | ICD-10-CM

## 2016-10-11 DIAGNOSIS — R300 Dysuria: Secondary | ICD-10-CM | POA: Diagnosis not present

## 2016-10-11 LAB — POC URINALSYSI DIPSTICK (AUTOMATED)
BILIRUBIN UA: NEGATIVE
Blood, UA: NEGATIVE
GLUCOSE UA: NEGATIVE
Ketones, UA: NEGATIVE
NITRITE UA: NEGATIVE
PH UA: 6
Protein, UA: NEGATIVE
SPEC GRAV UA: 1.02
UROBILINOGEN UA: 0.2

## 2016-10-11 LAB — HM DIABETES FOOT EXAM

## 2016-10-11 MED ORDER — TRIAMCINOLONE ACETONIDE 0.1 % EX CREA: 1.0000 "application " | TOPICAL_CREAM | Freq: Two times a day (BID) | CUTANEOUS | 0 refills | Status: DC

## 2016-10-11 MED ORDER — VITAMIN D (ERGOCALCIFEROL) 1.25 MG (50000 UNIT) PO CAPS: 50000.0000 [IU] | ORAL_CAPSULE | ORAL | 0 refills | Status: DC

## 2016-10-11 MED ORDER — GLUCOSE BLOOD VI STRP: ORAL_STRIP | 12 refills | Status: DC

## 2016-10-11 NOTE — Assessment & Plan Note (Signed)
Replete

## 2016-10-11 NOTE — Patient Instructions (Addendum)
Get back on track with regular exercsie.. Continue low fat, low chol diet, low carb diet. Work on weight loss.  Replace vit d x 12 weeks then restart OTC twice daily 400 IU.

## 2016-10-11 NOTE — Assessment & Plan Note (Signed)
No clear shingles or herpes blisters.   Will treat with topical steroid for contact derm.

## 2016-10-11 NOTE — Assessment & Plan Note (Signed)
Well controlled. Continue current medication.  

## 2016-10-11 NOTE — Progress Notes (Signed)
Subjective:    Patient ID: Katie [name], female    DOB: [birth year], 71 y.o.   MRN: [id number]  HPI  The patient saw Candis Musa, LPN for medicare wellness on 10/08/2016 Note reviewed in detail and important notes copied below. Health maintenance: A1C - completed  Abnormal screenings:  None  Patient concerns:  None  Nurse concerns: None  10/11/16    She reports she had lap chole by Dr. Adonis Huguenin 07/08/2016  She had follow up on 11/27.  She reports today that she occ has pain with twisting in RUQ.  No pain with eating,  occ more sore after eating. No N/V, occ D since surgery,C. No blood in stool.  no fatigue.  Diabetes:   Well controlled on metformin. Lab Results  Component Value Date   HGBA1C 6.5 10/08/2016  Using medications without difficulties: Hypoglycemic episodes: none  Hyperglycemic episodes: none Feet problems: none Blood Sugars averaging: FBS 115-125 eye exam within last year: YES  Elevated Cholesterol:  Trig high but LDL at goal on lipitor 10 mg daily Lab Results  Component Value Date   CHOL 180 10/08/2016   HDL 38.80 (L) 10/08/2016   LDLCALC 79 04/01/2016   LDLDIRECT 95.0 10/08/2016   TRIG 286.0 (H) 10/08/2016   CHOLHDL 5 10/08/2016  Using medications without problems: none Muscle aches: none Diet compliance: moderate Exercise: Decreased lately given surgery Other complaints:   Wt Readings from Last 3 Encounters:  10/11/16 170 lb 8 oz (77.3 kg)  10/08/16 170 lb 4 oz (77.2 kg)  07/22/16 168 lb (76.2 kg)    Hypertension:    Good control on losartan HCTZ BP Readings from Last 3 Encounters:  10/11/16 120/60  10/08/16 136/70  07/22/16 (!) 144/65  Using medication without problems or lightheadedness:  none Chest pain with exertion:none Edema:none Short of breath:none Average home BPs: not checking Other issues:  Low vit D: She is taking 400 Vit D.  She has red spot on right buttock... Not hot, no sore,  Area is itchy Has had  similar in past treated by derm with topical steroid. Social History /Family History/Past Medical History reviewed and updated if needed. Blood pressure 120/60, pulse 71, temperature 97.5 F (36.4 C), temperature source Oral, height 5' 2.25" (1.581 m), weight 170 lb 8 oz (77.3 kg).  Review of Systems  Constitutional: Negative for fatigue and fever.  HENT: Negative for congestion.   Eyes: Negative for pain.  Respiratory: Negative for cough and shortness of breath.   Cardiovascular: Negative for chest pain, palpitations and leg swelling.  Gastrointestinal: Positive for abdominal pain. Negative for abdominal distention and constipation.  Genitourinary: Positive for dysuria. Negative for vaginal bleeding.  Musculoskeletal: Positive for back pain.  Neurological: Negative for syncope, light-headedness and headaches.  Psychiatric/Behavioral: Negative for dysphoric mood.       Objective:   Physical Exam  Constitutional: Vital signs are normal. She appears well-developed and well-nourished. She is cooperative.  Non-toxic appearance. She does not appear ill. No distress.  HENT:  Head: Normocephalic.  Right Ear: Hearing, tympanic membrane, external ear and ear canal normal. Tympanic membrane is not erythematous, not retracted and not bulging.  Left Ear: Hearing, tympanic membrane, external ear and ear canal normal. Tympanic membrane is not erythematous, not retracted and not bulging.  Nose: No mucosal edema or rhinorrhea. Right sinus exhibits no maxillary sinus tenderness and no frontal sinus tenderness. Left sinus exhibits no maxillary sinus tenderness and no frontal sinus tenderness.  Mouth/Throat: Uvula is midline, oropharynx  is clear and moist and mucous membranes are normal.  Eyes: Conjunctivae, EOM and lids are normal. Pupils are equal, round, and reactive to light. Lids are everted and swept, no foreign bodies found.  Neck: Trachea normal and normal range of motion. Neck supple. Carotid  bruit is not present. No thyroid mass and no thyromegaly present.  Cardiovascular: Normal rate, regular rhythm, S1 normal, S2 normal, normal heart sounds, intact distal pulses and normal pulses.  Exam reveals no gallop and no friction rub.   No murmur heard. Pulmonary/Chest: Effort normal and breath sounds normal. No tachypnea. No respiratory distress. She has no decreased breath sounds. She has no wheezes. She has no rhonchi. She has no rales.  Abdominal: Soft. Normal appearance and bowel sounds are normal. There is no tenderness.  Genitourinary:  Genitourinary Comments: Red lesion/ulcer on labial tissue, 0.3 cm, sore to touch  Neurological: She is alert.  Skin: Skin is warm, dry and intact. No rash noted.  2 cm erythematous patch on right upper  Buttock, no flake, no blisters  Psychiatric: Her speech is normal and behavior is normal. Judgment and thought content normal. Her mood appears not anxious. Cognition and memory are normal. She does not exhibit a depressed mood.     Diabetic foot exam: Normal inspection No skin breakdown No calluses  Normal DP pulses Normal sensation to light touch and monofilament Nails normal      Assessment & Plan:  The patient's preventative maintenance and recommended screening tests for an annual wellness exam were reviewed in full today. Brought up to date unless services declined.  Counselled on the importance of diet, exercise, and its role in overall health and mortality. The patient's FH and SH was reviewed, including their home life, tobacco status, and drug and alcohol status.   Last PAP 09/2014 nml, neg HPV, no further indicated. DVE:  She has been noting irritation of labia at spot, white, presents in last few days ( she does wear pads., no family history of ovarian or uterine cancer She has had mammogram ( nml) 07/2016 DEXA (mild osteopenia) 12/15, stable (07/2016) Vaccines: uptodate Colon: 06/2009 Pearletha Furl , repeat in 10  years. .Hep C:neg  Nonsmoker

## 2016-10-11 NOTE — Addendum Note (Signed)
Addended by: Carter Kitten on: 10/11/2016 12:19 PM   Modules accepted: Orders

## 2016-10-11 NOTE — Assessment & Plan Note (Signed)
Increased trig... Work on low carb low fat diet.  LDL at goal on statin.

## 2016-10-11 NOTE — Assessment & Plan Note (Addendum)
Viral culture preformed, but likely due to abrasion from pad.  Apply barrier cream for healing.  Dysuria .. UA  contaminated per micro  [place] due to contact  Of urine with ulcer.

## 2016-10-11 NOTE — Assessment & Plan Note (Signed)
Well controlled. Continue current medication. Encouraged exercise, weight loss, healthy eating habits.  

## 2016-10-11 NOTE — Progress Notes (Signed)
Pre visit review using our clinic review tool, if applicable. No additional management support is needed unless otherwise documented below in the visit note. 

## 2016-10-14 LAB — HERPES SIMPLEX VIRUS CULTURE: ORGANISM ID, BACTERIA: DETECTED

## 2016-10-15 ENCOUNTER — Telehealth: Payer: Self-pay | Admitting: Family Medicine

## 2016-10-15 DIAGNOSIS — B009 Herpesviral infection, unspecified: Secondary | ICD-10-CM

## 2016-10-15 MED ORDER — VALACYCLOVIR HCL 500 MG PO TABS: ORAL_TABLET | ORAL | 0 refills | Status: DC

## 2016-10-15 NOTE — Telephone Encounter (Signed)
Called pt with HSV 1 report.  Answered questions.  Will treat prn with valacyclovir. Sent in Rx.

## 2016-10-28 ENCOUNTER — Telehealth: Payer: Self-pay

## 2016-10-28 DIAGNOSIS — M65322 Trigger finger, left index finger: Secondary | ICD-10-CM | POA: Diagnosis not present

## 2016-10-28 DIAGNOSIS — R1011 Right upper quadrant pain: Secondary | ICD-10-CM

## 2016-10-28 NOTE — Telephone Encounter (Signed)
Patient called in at this time and explains that she has began to have RUQ pain once again for the last 3-4 weeks. She states that this feels similar to her previous gallbladder pain prior to surgery. Denies nausea and vomiting, denies fever/chills. Bowel movements are daily and are normal per patient. She does have some loose stools after eating fatty foods. History of Gastric Ulcer 35 years ago. Denies recent NSAID and ETOH use. Patient states that her Liver was bleeding during surgery. Op note reviewed and it appears that this was the case. Patient scheduled to see Dr Adonis Huguenin on Wednesday this week.

## 2016-10-28 NOTE — Telephone Encounter (Signed)
Spoke with Dr. Dahlia Byes in regards to patient. He would like to obtain a RUQ [place], CBC, and CMP prior to patient's Wednesday appointment.  Ultrasound scheduled for tomorrow, 10/29/16 at 0930am at [facility] She is to have nothing by mouth 4 hours prior to her testing. Patient will have her labs done at that time as well. All orders placed.  Call was made to patient to give the above information. No answer. Left voicemail for return phone call.

## 2016-10-28 NOTE — Telephone Encounter (Signed)
Patient returned your phone call. I have read the note documented below to the patient. She understood and had no further questions.

## 2016-10-29 ENCOUNTER — Ambulatory Visit
Admission: RE | Admit: 2016-10-29 | Discharge: 2016-10-29 | Disposition: A | Discharge status: Home / Self Care | Payer: Medicare Other | Source: Ambulatory Visit | Attending: Surgery | Admitting: Surgery

## 2016-10-29 ENCOUNTER — Other Ambulatory Visit
Admission: RE | Admit: 2016-10-29 | Discharge: 2016-10-29 | Disposition: A | Discharge status: Home / Self Care | Payer: Medicare Other | Source: Ambulatory Visit | Attending: Surgery | Admitting: Surgery

## 2016-10-29 DIAGNOSIS — Z9049 Acquired absence of other specified parts of digestive tract: Secondary | ICD-10-CM | POA: Insufficient documentation

## 2016-10-29 DIAGNOSIS — R1011 Right upper quadrant pain: Secondary | ICD-10-CM

## 2016-10-29 LAB — COMPREHENSIVE METABOLIC PANEL
ALK PHOS: 68 U/L (ref 38–126)
ALT: 23 U/L (ref 14–54)
ANION GAP: 9 (ref 5–15)
AST: 24 U/L (ref 15–41)
Albumin: 4.1 g/dL (ref 3.5–5.0)
BILIRUBIN TOTAL: 0.5 mg/dL (ref 0.3–1.2)
BUN: 19 mg/dL (ref 6–20)
CALCIUM: 8.7 mg/dL — AB (ref 8.9–10.3)
CO2: 26 mmol/L (ref 22–32)
CREATININE: 0.91 mg/dL (ref 0.44–1.00)
Chloride: 97 mmol/L — ABNORMAL LOW (ref 101–111)
Glucose, Bld: 124 mg/dL — ABNORMAL HIGH (ref 65–99)
Potassium: 3.6 mmol/L (ref 3.5–5.1)
Sodium: 132 mmol/L — ABNORMAL LOW (ref 135–145)
TOTAL PROTEIN: 7.1 g/dL (ref 6.5–8.1)

## 2016-10-29 LAB — CBC WITH DIFFERENTIAL/PLATELET
Basophils Absolute: 0 10*3/uL (ref 0–0.1)
Basophils Relative: 1 %
EOS ABS: 0.1 10*3/uL (ref 0–0.7)
Eosinophils Relative: 3 %
HCT: 37 % (ref 35.0–47.0)
HEMOGLOBIN: 12.6 g/dL (ref 12.0–16.0)
LYMPHS ABS: 1.4 10*3/uL (ref 1.0–3.6)
LYMPHS PCT: 33 %
MCH: 32.6 pg (ref 26.0–34.0)
MCHC: 34.2 g/dL (ref 32.0–36.0)
MCV: 95.5 fL (ref 80.0–100.0)
MONOS PCT: 9 %
Monocytes Absolute: 0.4 10*3/uL (ref 0.2–0.9)
NEUTROS PCT: 54 %
Neutro Abs: 2.3 10*3/uL (ref 1.4–6.5)
Platelets: 223 10*3/uL (ref 150–440)
RBC: 3.88 MIL/uL (ref 3.80–5.20)
RDW: 13.9 % (ref 11.5–14.5)
WBC: 4.2 10*3/uL (ref 3.6–11.0)

## 2016-10-30 ENCOUNTER — Encounter: Payer: Self-pay | Admitting: General Surgery

## 2016-10-30 ENCOUNTER — Ambulatory Visit (INDEPENDENT_AMBULATORY_CARE_PROVIDER_SITE_OTHER): Payer: Medicare Other | Admitting: General Surgery

## 2016-10-30 VITALS — BP 167/80 | HR 81 | Temp 97.6°F | Wt 170.0 lb

## 2016-10-30 DIAGNOSIS — R1011 Right upper quadrant pain: Secondary | ICD-10-CM | POA: Diagnosis not present

## 2016-10-30 NOTE — Progress Notes (Signed)
Outpatient Surgical Follow Up  10/30/2016  Katie [name] is an 71 y.o. female.   Chief Complaint  Patient presents with  . Follow-up    Laparoscopic Cholecystectomy (07/08/16)- Dr. Adonis Huguenin     HPI: 71 year old female well-known to the surgical service returns to clinic for evaluation of right upper quadrant abdominal pain. Patient reported over the phone that she felt like the pain was very similar to that from her gallbladder prior to it being removed. Upon questioning today she states it is somewhat different but continues in her right upper quadrant. It is worsened with eating and improved with rest. She denies any fevers, chills, nausea, vomiting, chest pain, shortness of breath, diarrhea, constipation. She was worried that there was something wrong from the surgery and wanted evaluation. She does report again a history of previous peptic ulcer disease but has not been seen by GI and multiple years. She had imaging and labs performed yesterday prior to coming to clinic today. She reports that the pain is always in the right upper quadrant, it is a dull ache that is worsened with eating.  Past Medical History:  Diagnosis Date  . Allergy   . Anemia   . Blood transfusion without reported diagnosis   . Diverticulitis   . DJD (degenerative joint disease)   . DM type 2 (diabetes mellitus, type 2) ([place])   . Fibrocystic breast disease   . GERD (gastroesophageal reflux disease)   . History of chicken pox   . Hypercholesteremia   . Hypertension   . Lichen planopilaris   . Urinary incontinence     Past Surgical History:  Procedure Laterality Date  . BREAST SURGERY  02/1975   Left Breast Biopsy-Fibrocystic Disease  . BREAST SURGERY  10/1981   Bialteral Capsulectomy Breast Surgery  . BREAST SURGERY  07/1998   Replace Bilateral Silicone Implants  . BREAST SURGERY  01/1976   Bilater breast surgery to remove fibrocystic tissue and replace with silicone implants  . CHOLECYSTECTOMY N/A  07/08/2016   Procedure: LAPAROSCOPIC CHOLECYSTECTOMY;  Surgeon: [name], MD;  Location: [facility] ORS;  Service: General;  Laterality: N/A;  . DILATION AND CURETTAGE OF UTERUS  01/1994  . FINGER SURGERY  2001 & 2003   Trigger Finger  . FOOT SURGERY  1980   To Relieve Pinched Nerve  . FOOT SURGERY  07/2008   Left Foot-Plantar Fasciitis  . JOINT REPLACEMENT  04/19/2013   Hip Replacement-Right  . PLACEMENT OF BREAST IMPLANTS  12/26/2004   Replace Failed Implants  . RHINOPLASTY  03/1964   To Correct Deviated Septum    Family History  Problem Relation Age of Onset  . Arthritis Mother   . Hypertension Mother   . Heart disease Father   . Diabetes Father   . Breast cancer Sister   . Hypertension Brother   . Leukemia Brother   . Coronary artery disease Brother     Social History:  reports that she has never smoked. She has never used smokeless tobacco. She reports that she does not drink alcohol or use drugs.  Allergies:  Allergies  Allergen Reactions  . Ciprofloxacin     REACTION: rash  . Daypro [Oxaprozin]   . Enalapril Maleate Nausea Only  . Erythromycin     REACTION: Rash, hives  . Nsaids   . Vioxx [Rofecoxib]     Medications reviewed.    ROS A multipoint review of systems was completed. All pertinent positives and negatives are documented within the history of  present illness the remainder are negative.   BP (!) 167/80   Pulse 81   Temp 97.6 F (36.4 C) (Oral)   Wt 77.1 kg (170 lb)   BMI 30.84 kg/m   Physical Exam Gen.: No acute distress Neck: Supple and nontender Lymph nodes: No palpable cervical, clavicular, axillary lymphadenopathy Chest: Clear to auscultation without accessory muscle usage Heart: Regular rhythm without obvious murmur or rub Abdomen: Soft,  nondistended. The abdomen is minimally tender to palpation in the entire right upper quadrant however there is no rebound or guarding. Well-healed laparoscopic incision sites that it was erythema  or drainage. Extremities: Moves all extremities well. Psych: Alert and oriented 3   Results for orders placed or performed during the hospital encounter of 10/29/16 (from the past 48 hour(s))  CBC with Differential     Status: None   Collection Time: 10/29/16  9:49 AM  Result Value Ref Range   WBC 4.2 3.6 - 11.0 K/uL   RBC 3.88 3.80 - 5.20 MIL/uL   Hemoglobin 12.6 12.0 - 16.0 g/dL   HCT 37.0 35.0 - 47.0 %   MCV 95.5 80.0 - 100.0 fL   MCH 32.6 26.0 - 34.0 pg   MCHC 34.2 32.0 - 36.0 g/dL   RDW 13.9 11.5 - 14.5 %   Platelets 223 150 - 440 K/uL   Neutrophils Relative % 54 %   Neutro Abs 2.3 1.4 - 6.5 K/uL   Lymphocytes Relative 33 %   Lymphs Abs 1.4 1.0 - 3.6 K/uL   Monocytes Relative 9 %   Monocytes Absolute 0.4 0.2 - 0.9 K/uL   Eosinophils Relative 3 %   Eosinophils Absolute 0.1 0 - 0.7 K/uL   Basophils Relative 1 %   Basophils Absolute 0.0 0 - 0.1 K/uL  Comprehensive metabolic panel     Status: Abnormal   Collection Time: 10/29/16  9:49 AM  Result Value Ref Range   Sodium 132 (L) 135 - 145 mmol/L   Potassium 3.6 3.5 - 5.1 mmol/L   Chloride 97 (L) 101 - 111 mmol/L   CO2 26 22 - 32 mmol/L   Glucose, Bld 124 (H) 65 - 99 mg/dL   BUN 19 6 - 20 mg/dL   Creatinine, Ser 0.91 0.44 - 1.00 mg/dL   Calcium 8.7 (L) 8.9 - 10.3 mg/dL   Total Protein 7.1 6.5 - 8.1 g/dL   Albumin 4.1 3.5 - 5.0 g/dL   AST 24 15 - 41 U/L   ALT 23 14 - 54 U/L   Alkaline Phosphatase 68 38 - 126 U/L   Total Bilirubin 0.5 0.3 - 1.2 mg/dL   GFR calc non Af Amer >60 >60 mL/min   GFR calc Af Amer >60 >60 mL/min    Comment: (NOTE) The eGFR has been calculated using the CKD EPI equation. This calculation has not been validated in all clinical situations. eGFR's persistently <60 mL/min signify possible Chronic Kidney Disease.    Anion gap 9 5 - 15   US Abdomen Limited Ruq  Result Date: 10/29/2016 CLINICAL DATA:  71 year old female with right upper quadrant abdominal pain status post cholecystectomy in  November 2017. EXAM: US ABDOMEN LIMITED - RIGHT UPPER QUADRANT COMPARISON:  Preoperative ultrasound 06/07/2016 FINDINGS: Gallbladder: Surgically absent Common bile duct: Diameter: 3 mm, stable and normal. Liver: Liver echogenicity is stable at the upper limits of normal. No discrete liver lesion. No intrahepatic biliary ductal dilatation. Other findings: Stable and negative visible right kidney. IMPRESSION: Negative right upper quadrant ultrasound  status post cholecystectomy. Electronically Signed   By: Genevie Ann M.D.   On: 10/29/2016 09:42    Assessment/Plan:  1. RUQ pain 71 year old female with right upper quadrant pain now 3 months status post laparoscopic cholecystectomy. Imaging and labs very reassuring and reviewed with the patient. Noted that they are all within normal limits. Given this is very reassuring that his only postoperative problem. Giving her history of a prior ulcer discussed the importance of further workup via gastroenterology. Referral was placed today for further workup for right upper quadrant pain to ensure that it is not a recurrence of her ulcer. All questions answered to the patient's satisfaction. She'll follow-up in the surgery clinic on an as-needed basis.  A total of 25 minutes was used on this encounter with greater than 50% of it used for counseling and coordination of care.     [name], MD FACS General Surgeon  10/30/2016,11:10 AM

## 2016-10-30 NOTE — Patient Instructions (Addendum)
You will see Dr. Jonathon Bellows on 11/26/2016 at 3:30 PM.

## 2016-11-01 ENCOUNTER — Telehealth: Payer: Self-pay

## 2016-11-01 NOTE — Telephone Encounter (Signed)
  I have put in an internal referral to [facility]. An appointment has been scheduled for 11/26/16 at 3:30 with Dr. Vicente Males

## 2016-11-26 ENCOUNTER — Ambulatory Visit (INDEPENDENT_AMBULATORY_CARE_PROVIDER_SITE_OTHER): Payer: Medicare Other | Admitting: Gastroenterology

## 2016-11-26 ENCOUNTER — Other Ambulatory Visit: Payer: Self-pay

## 2016-11-26 ENCOUNTER — Encounter: Payer: Self-pay | Admitting: Gastroenterology

## 2016-11-26 VITALS — BP 120/75 | HR 66 | Temp 97.7°F | Ht 62.25 in | Wt 171.4 lb

## 2016-11-26 DIAGNOSIS — R1013 Epigastric pain: Secondary | ICD-10-CM

## 2016-11-26 DIAGNOSIS — R1011 Right upper quadrant pain: Secondary | ICD-10-CM

## 2016-11-26 MED ORDER — OMEPRAZOLE 40 MG PO CPDR: 40.0000 mg | DELAYED_RELEASE_CAPSULE | Freq: Every day | ORAL | 0 refills | Status: DC

## 2016-11-26 NOTE — Progress Notes (Signed)
Gastroenterology Consultation  Referring Provider:     [name], MD Primary Care Physician:  Eliezer Lofts, MD Primary Gastroenterologist:  Dr. Jonathon Bellows  Reason for Consultation:     RUQ pain         HPI:   Katie [name] is a 71 y.o. y/o female referred for consultation & management  by Dr. Eliezer Lofts, MD.    She has seen surgery for her RUQ pain , underwent a cholecystectomy in 06/2017 . Despite which continues to have the pain. Since it was persistent Dr Adonis Huguenin he wanted that patient to see GI to evaluate for peptic ulcers.    RUQ USG 10/2016 was normal with only post operative changes. LFT's normal 10/2016   Abdominal pain: Onset:  She says after he gall bladder surgery was doing well, In 08/2016 developed pain in the same area, she says the gall bladder pain was sharp but the present pain is "knawing", worse with eating , the pain occurs a few times a week .  Site :RUQ  Radiation: localized  Severity :"can hurt pretty bad"  Nature of pain: knawing  Aggravating factors: food, 45 mins after meals, lasts for a few hours Relieving factors :nothing  Weight loss: no  NSAID use: none  PPI use :none presently  Gall bladder surgery: yes  Frequency of bowel movements: daily and soft  Change in bowel movements:  No change  Relief with bowel movements: yes  Gas/Bloating/Abdominal distension: bloating - pain worse when she does have bloating .   She consumes artificial sweetners as she is diabetic- splenda , does not drink much sodas, Brocolli makes it worse.    Past Medical History:  Diagnosis Date  . Allergy   . Anemia   . Blood transfusion without reported diagnosis   . Diverticulitis   . DJD (degenerative joint disease)   . DM type 2 (diabetes mellitus, type 2) ([place])   . Fibrocystic breast disease   . GERD (gastroesophageal reflux disease)   . History of chicken pox   . Hypercholesteremia   . Hypertension   . Lichen planopilaris   . Urinary incontinence      Past Surgical History:  Procedure Laterality Date  . BREAST SURGERY  02/1975   Left Breast Biopsy-Fibrocystic Disease  . BREAST SURGERY  10/1981   Bialteral Capsulectomy Breast Surgery  . BREAST SURGERY  07/1998   Replace Bilateral Silicone Implants  . BREAST SURGERY  01/1976   Bilater breast surgery to remove fibrocystic tissue and replace with silicone implants  . CHOLECYSTECTOMY N/A 07/08/2016   Procedure: LAPAROSCOPIC CHOLECYSTECTOMY;  Surgeon: [name], MD;  Location: [facility] ORS;  Service: General;  Laterality: N/A;  . DILATION AND CURETTAGE OF UTERUS  01/1994  . FINGER SURGERY  2001 & 2003   Trigger Finger  . FOOT SURGERY  1980   To Relieve Pinched Nerve  . FOOT SURGERY  07/2008   Left Foot-Plantar Fasciitis  . JOINT REPLACEMENT  04/19/2013   Hip Replacement-Right  . PLACEMENT OF BREAST IMPLANTS  12/26/2004   Replace Failed Implants  . RHINOPLASTY  03/1964   To Correct Deviated Septum    Prior to Admission medications   Medication Sig Start Date End Date Taking? Authorizing Provider  atorvastatin (LIPITOR) 10 MG tablet take 1 tablet by mouth once daily 10/09/16   Amy E Diona Browner, MD  azelastine (ASTELIN) 0.1 % nasal spray instill 1 spray into each nostril twice a day for 1 week then if needed  07/11/16   Amy Cletis Athens, MD  Calcium-Vitamin D-Vitamin K (VIACTIV) [phone] MG-UNT-MCG CHEW Chew 1 each by mouth daily.    Historical Provider, MD  cholecalciferol (VITAMIN D) 400 units TABS tablet Take 400 Units by mouth 2 (two) times daily.    Historical Provider, MD  clobetasol (TEMOVATE) 0.05 % external solution Apply 1 application topically. Reported on 08/16/2015 06/02/14   Historical Provider, MD  fluticasone (FLONASE) 50 MCG/ACT nasal spray instill 2 sprays into each nostril once daily 07/05/16   Amy E Diona Browner, MD  glucose blood (FREESTYLE LITE) test strip Use as instructed 10/11/16   Amy E Diona Browner, MD  hydrochlorothiazide (HYDRODIURIL) 25 MG tablet take 1 tablet by mouth  once daily 10/09/16   Amy E Diona Browner, MD  loratadine (CLARITIN) 10 MG tablet Take 10 mg by mouth daily.    Historical Provider, MD  losartan (COZAAR) 100 MG tablet take 1 tablet by mouth once daily 10/09/16   Amy E Diona Browner, MD  metFORMIN (GLUCOPHAGE) 500 MG tablet take 1 tablet by mouth once daily 10/09/16   Amy E Diona Browner, MD  metroNIDAZOLE (METROCREAM) 0.75 % cream Apply 1 application topically 2 (two) times daily as needed.    Historical Provider, MD  tacrolimus (PROTOPIC) 0.1 % ointment Apply 1 application topically daily as needed.    Historical Provider, MD  triamcinolone cream (KENALOG) 0.1 % Apply 1 application topically 2 (two) times daily. 10/11/16   Amy Cletis Athens, MD  valACYclovir (VALTREX) 500 MG tablet For initial flare 2 tab twice daily x 7 days. For future flares can treat with 1 tab twice daily x 3 days 10/15/16   [name], MD  Vitamin D, Ergocalciferol, (DRISDOL) 50000 units CAPS capsule Take 1 capsule (50,000 Units total) by mouth every 7 (seven) days. 10/11/16   [name], MD    Family History  Problem Relation Age of Onset  . Arthritis Mother   . Hypertension Mother   . Heart disease Father   . Diabetes Father   . Breast cancer Sister   . Hypertension Brother   . Leukemia Brother   . Coronary artery disease Brother      Social History  Substance Use Topics  . Smoking status: Never Smoker  . Smokeless tobacco: Never Used  . Alcohol use No    Allergies as of 11/26/2016 - Review Complete 10/30/2016  Allergen Reaction Noted  . Ciprofloxacin  06/26/2009  . Daypro [oxaprozin]  10/13/2012  . Enalapril maleate Nausea Only 10/06/2015  . Erythromycin  06/26/2009  . Nsaids  10/13/2012  . Vioxx [rofecoxib]  10/13/2012    Review of Systems:    All systems reviewed and negative except where noted in HPI.   Physical Exam:  BP 120/75 (BP Location: Left Arm, Patient Position: Sitting, Cuff Size: Large)   Pulse 66   Temp 97.7 F (36.5 C) (Oral)   Ht 5' 2.25" (1.581  m)   Wt 171 lb 6.4 oz (77.7 kg)   BMI 31.10 kg/m  No LMP recorded. Patient is postmenopausal. Psych:  Alert and cooperative. Normal mood and affect. General:   Alert,  Well-developed, well-nourished, pleasant and cooperative in NAD Head:  Normocephalic and atraumatic. Eyes:  Sclera clear, no icterus.   Conjunctiva pink. Ears:  Normal auditory acuity. Nose:  No deformity, discharge, or lesions. Mouth:  No deformity or lesions,oropharynx pink & moist. Neck:  Supple; no masses or thyromegaly. Lungs:  Respirations even and unlabored.  Clear throughout to auscultation.   No  wheezes, crackles, or rhonchi. No acute distress. Heart:  Regular rate and rhythm; no murmurs, clicks, rubs, or gallops. Abdomen:  Normal bowel sounds.  No bruits.  Soft, non-tender and non-distended without masses, hepatosplenomegaly or hernias noted.  No guarding or rebound tenderness.    Msk:  Symmetrical without gross deformities. Good, equal movement & strength bilaterally. Skin:  Intact without significant lesions or rashes. No jaundice. Lymph Nodes:  No significant cervical adenopathy. Psych:  Alert and cooperative. Normal mood and affect.  Imaging Studies: US Abdomen Limited Ruq  Result Date: 10/29/2016 CLINICAL DATA:  72 year old female with right upper quadrant abdominal pain status post cholecystectomy in November 2017. EXAM: US ABDOMEN LIMITED - RIGHT UPPER QUADRANT COMPARISON:  Preoperative ultrasound 06/07/2016 FINDINGS: Gallbladder: Surgically absent Common bile duct: Diameter: 3 mm, stable and normal. Liver: Liver echogenicity is stable at the upper limits of normal. No discrete liver lesion. No intrahepatic biliary ductal dilatation. Other findings: Stable and negative visible right kidney. IMPRESSION: Negative right upper quadrant ultrasound status post cholecystectomy. Electronically Signed   By: Genevie Ann M.D.   On: 10/29/2016 09:42    Assessment and Plan:   SAREA FYFE is a 71 y.o. y/o female has been  referred for RUQ pain, the history is suggestive of gas/bloating secondary to foods she eats which is causing her the pain, less likely ulcers but in view of age [name] evakuate for dyspepsia. If no better at next visit [name] need MRCP to r/o stones in the CBD.   Plan   1. PPI 2. EGD 3. Low FODMAP diet.   Follow up in 6-8 weeks.   Dr Jonathon Bellows MD

## 2016-11-29 DIAGNOSIS — L661 Lichen planopilaris: Secondary | ICD-10-CM | POA: Diagnosis not present

## 2016-12-04 ENCOUNTER — Encounter: Payer: Self-pay | Admitting: *Deleted

## 2016-12-05 ENCOUNTER — Encounter: Payer: Self-pay | Admitting: *Deleted

## 2016-12-05 ENCOUNTER — Encounter
Admission: RE | Disposition: A | Discharge status: Home / Self Care | Payer: Self-pay | Source: Ambulatory Visit | Attending: Gastroenterology

## 2016-12-05 ENCOUNTER — Ambulatory Visit
Admission: RE | Admit: 2016-12-05 | Discharge: 2016-12-05 | Disposition: A | Discharge status: Home / Self Care | Payer: Medicare Other | Source: Ambulatory Visit | Attending: Gastroenterology | Admitting: Gastroenterology

## 2016-12-05 ENCOUNTER — Ambulatory Visit: Payer: Medicare Other | Admitting: *Deleted

## 2016-12-05 DIAGNOSIS — Z8249 Family history of ischemic heart disease and other diseases of the circulatory system: Secondary | ICD-10-CM | POA: Insufficient documentation

## 2016-12-05 DIAGNOSIS — R1011 Right upper quadrant pain: Secondary | ICD-10-CM | POA: Insufficient documentation

## 2016-12-05 DIAGNOSIS — L661 Lichen planopilaris: Secondary | ICD-10-CM | POA: Insufficient documentation

## 2016-12-05 DIAGNOSIS — Z96641 Presence of right artificial hip joint: Secondary | ICD-10-CM | POA: Insufficient documentation

## 2016-12-05 DIAGNOSIS — E119 Type 2 diabetes mellitus without complications: Secondary | ICD-10-CM | POA: Insufficient documentation

## 2016-12-05 DIAGNOSIS — M1990 Unspecified osteoarthritis, unspecified site: Secondary | ICD-10-CM | POA: Diagnosis not present

## 2016-12-05 DIAGNOSIS — Z806 Family history of leukemia: Secondary | ICD-10-CM | POA: Insufficient documentation

## 2016-12-05 DIAGNOSIS — Z881 Allergy status to other antibiotic agents status: Secondary | ICD-10-CM | POA: Insufficient documentation

## 2016-12-05 DIAGNOSIS — E7800 Pure hypercholesterolemia, unspecified: Secondary | ICD-10-CM | POA: Insufficient documentation

## 2016-12-05 DIAGNOSIS — Z833 Family history of diabetes mellitus: Secondary | ICD-10-CM | POA: Diagnosis not present

## 2016-12-05 DIAGNOSIS — Z9049 Acquired absence of other specified parts of digestive tract: Secondary | ICD-10-CM | POA: Insufficient documentation

## 2016-12-05 DIAGNOSIS — E755 Other lipid storage disorders: Secondary | ICD-10-CM | POA: Diagnosis not present

## 2016-12-05 DIAGNOSIS — Z7984 Long term (current) use of oral hypoglycemic drugs: Secondary | ICD-10-CM | POA: Diagnosis not present

## 2016-12-05 DIAGNOSIS — I10 Essential (primary) hypertension: Secondary | ICD-10-CM | POA: Diagnosis not present

## 2016-12-05 DIAGNOSIS — R32 Unspecified urinary incontinence: Secondary | ICD-10-CM | POA: Diagnosis not present

## 2016-12-05 DIAGNOSIS — K5792 Diverticulitis of intestine, part unspecified, without perforation or abscess without bleeding: Secondary | ICD-10-CM | POA: Insufficient documentation

## 2016-12-05 DIAGNOSIS — K317 Polyp of stomach and duodenum: Secondary | ICD-10-CM | POA: Insufficient documentation

## 2016-12-05 DIAGNOSIS — Z888 Allergy status to other drugs, medicaments and biological substances status: Secondary | ICD-10-CM | POA: Diagnosis not present

## 2016-12-05 DIAGNOSIS — K2950 Unspecified chronic gastritis without bleeding: Secondary | ICD-10-CM | POA: Diagnosis not present

## 2016-12-05 DIAGNOSIS — N6019 Diffuse cystic mastopathy of unspecified breast: Secondary | ICD-10-CM | POA: Diagnosis not present

## 2016-12-05 DIAGNOSIS — Z79899 Other long term (current) drug therapy: Secondary | ICD-10-CM | POA: Insufficient documentation

## 2016-12-05 DIAGNOSIS — Z9882 Breast implant status: Secondary | ICD-10-CM | POA: Diagnosis not present

## 2016-12-05 DIAGNOSIS — K219 Gastro-esophageal reflux disease without esophagitis: Secondary | ICD-10-CM | POA: Insufficient documentation

## 2016-12-05 DIAGNOSIS — K3189 Other diseases of stomach and duodenum: Secondary | ICD-10-CM | POA: Diagnosis not present

## 2016-12-05 DIAGNOSIS — Z8261 Family history of arthritis: Secondary | ICD-10-CM | POA: Insufficient documentation

## 2016-12-05 DIAGNOSIS — D649 Anemia, unspecified: Secondary | ICD-10-CM | POA: Insufficient documentation

## 2016-12-05 DIAGNOSIS — K2970 Gastritis, unspecified, without bleeding: Secondary | ICD-10-CM

## 2016-12-05 DIAGNOSIS — Z803 Family history of malignant neoplasm of breast: Secondary | ICD-10-CM | POA: Insufficient documentation

## 2016-12-05 HISTORY — PX: ESOPHAGOGASTRODUODENOSCOPY (EGD) WITH PROPOFOL: SHX5813

## 2016-12-05 SURGERY — ESOPHAGOGASTRODUODENOSCOPY (EGD) WITH PROPOFOL
Anesthesia: General

## 2016-12-05 MED ORDER — PROPOFOL 500 MG/50ML IV EMUL
INTRAVENOUS | Status: DC | PRN
  Administered 2016-12-05: 100 ug/kg/min via INTRAVENOUS

## 2016-12-05 MED ORDER — BUTAMBEN-TETRACAINE-BENZOCAINE 2-2-14 % EX AERO
INHALATION_SPRAY | CUTANEOUS | Status: DC | PRN
  Administered 2016-12-05: 2 via TOPICAL

## 2016-12-05 MED ORDER — SODIUM CHLORIDE 0.9 % IV SOLN
INTRAVENOUS | Status: DC
  Administered 2016-12-05: 08:00:00 via INTRAVENOUS

## 2016-12-05 NOTE — Anesthesia Postprocedure Evaluation (Signed)
Anesthesia Post Note  Patient: ZAVANNAH DEBLOIS  Procedure(s) Performed: Procedure(s) (LRB): ESOPHAGOGASTRODUODENOSCOPY (EGD) WITH PROPOFOL (N/A)  Patient location during evaluation: Endoscopy Anesthesia Type: General Level of consciousness: awake and alert Pain management: pain level controlled Vital Signs Assessment: post-procedure vital signs reviewed and stable Respiratory status: spontaneous breathing, nonlabored ventilation, respiratory function stable and patient connected to nasal cannula oxygen Cardiovascular status: blood pressure returned to baseline and stable Postop Assessment: no signs of nausea or vomiting Anesthetic complications: no     Last Vitals:  Vitals:   12/05/16 0910 12/05/16 0920  BP: 102/63 (!) 109/58  Pulse: (!) 58 (!) 59  Resp: 14 19  Temp:      Last Pain:  Vitals:   12/05/16 0850  TempSrc: Tympanic                 Martha Clan

## 2016-12-05 NOTE — Transfer of Care (Signed)
Immediate Anesthesia Transfer of Care Note  Patient: Katie [name]  Procedure(s) Performed: Procedure(s): ESOPHAGOGASTRODUODENOSCOPY (EGD) WITH PROPOFOL (N/A)  Patient Location: PACU  Anesthesia Type:General  Level of Consciousness: awake, alert  and oriented  Airway & Oxygen Therapy: Patient Spontanous Breathing and Patient connected to nasal cannula oxygen  Post-op Assessment: Report given to RN and Post -op Vital signs reviewed and stable  Post vital signs: Reviewed and stable  Last Vitals:  Vitals:   12/05/16 0801  BP: (!) 129/47  Pulse: 83  Resp: 16  Temp: 36.3 C    Last Pain:  Vitals:   12/05/16 0801  TempSrc: Tympanic         Complications: No apparent anesthesia complications

## 2016-12-05 NOTE — Op Note (Signed)
[facility] Gastroenterology Patient Name: Katie [name] Procedure Date: 12/05/2016 8:38 AM MRN: [id number] Account #: [id number] Date of Birth: [birth year] Admit Type: Outpatient Age: 71 Room: [facility] ENDO ROOM 4 Gender: Female Note Status: Finalized Procedure:            Upper GI endoscopy Indications:          Abdominal pain in the right upper quadrant Providers:            Jonathon Bellows MD, MD Referring MD:         [name] MD, MD (Referring MD) Medicines:            Monitored Anesthesia Care Complications:        No immediate complications. Procedure:            Pre-Anesthesia Assessment:                       - Prior to the procedure, a History and Physical was                        performed, and patient medications, allergies and                        sensitivities were reviewed. The patient's tolerance of                        previous anesthesia was reviewed.                       - ASA Grade Assessment: II - A patient with mild                        systemic disease.                       After obtaining informed consent, the endoscope was                        passed under direct vision. Throughout the procedure,                        the patient's blood pressure, pulse, and oxygen                        saturations were monitored continuously. The Endoscope                        was introduced through the mouth, and advanced to the                        third part of duodenum. The upper GI endoscopy was                        accomplished with ease. The patient tolerated the                        procedure well. Findings:      The examined duodenum was normal.      The esophagus was normal.      Scattered mild inflammation characterized by congestion (edema) and       erosions was found in the gastric  antrum. Biopsies were taken with a       cold forceps for histology.      A single 6 mm sessile polyp with no stigmata of recent bleeding was        found in the gastric fundus. The polyp was removed with a cold biopsy       forceps. Resection and retrieval were complete. Impression:           - Normal examined duodenum.                       - Normal esophagus.                       - Gastritis. Biopsied.                       - A single gastric polyp. Resected and retrieved. Recommendation:       - Discharge patient to home (with escort).                       - Resume previous diet.                       - Continue present medications.                       - Await pathology results.                       - Return to my office in 2 weeks. Procedure Code(s):    --- Professional ---                       [phone], Esophagogastroduodenoscopy, flexible, transoral;                        with biopsy, single or multiple Diagnosis Code(s):    --- Professional ---                       K29.70, Gastritis, unspecified, without bleeding                       K31.7, Polyp of stomach and duodenum                       R10.11, Right upper quadrant pain CPT copyright 2016 American Medical Association. All rights reserved. The codes documented in this report are preliminary and upon coder review may  be revised to meet current compliance requirements. Jonathon Bellows, MD Jonathon Bellows MD, MD 12/05/2016 8:51:44 AM This report has been signed electronically. Number of Addenda: 0 Note Initiated On: 12/05/2016 8:38 AM      [facility]

## 2016-12-05 NOTE — Anesthesia Preprocedure Evaluation (Signed)
Anesthesia Evaluation  Patient identified by MRN, date of birth, ID band Patient awake    Reviewed: Allergy & Precautions, NPO status , Patient's Chart, lab work & pertinent test results, reviewed documented beta blocker date and time   History of Anesthesia Complications Negative for: history of anesthetic complications  Airway Mallampati: IV  TM Distance: <3 FB Neck ROM: full  Mouth opening: Limited Mouth Opening  Dental  (+) Chipped, Caps, Dental Advidsory Given   Pulmonary neg pulmonary ROS,           Cardiovascular Exercise Tolerance: Good hypertension, Pt. on medications (-) angina(-) CAD, (-) Past MI, (-) Cardiac Stents and (-) CABG (-) dysrhythmias (-) Valvular Problems/Murmurs     Neuro/Psych negative neurological ROS  negative psych ROS   GI/Hepatic Neg liver ROS, GERD  Controlled,  Endo/Other  diabetes, Type 2  Renal/GU negative Renal ROS  negative genitourinary   Musculoskeletal  (+) Arthritis ,   Abdominal   Peds  Hematology  (+) anemia ,   Anesthesia Other Findings Past Medical History: No date: Allergy No date: Anemia No date: Blood transfusion without reported diagnosis No date: Diverticulitis No date: DJD (degenerative joint disease) No date: DM type 2 (diabetes mellitus, type 2) ([facility]) No date: Fibrocystic breast disease No date: GERD (gastroesophageal reflux disease) No date: History of chicken pox No date: Hypercholesteremia No date: Hypertension 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 No date: Urinary incontinence   Reproductive/Obstetrics negative OB ROS                             Anesthesia Physical  Anesthesia Plan  ASA: III  Anesthesia Plan: General   Post-op Pain Management:    Induction: Intravenous  Airway Management Planned: Oral ETT  Additional Equipment:   Intra-op Plan:   Post-operative Plan:   Informed Consent: I have reviewed the patients  History and Physical, chart, labs and discussed the procedure including the risks, benefits and alternatives for the proposed anesthesia with the patient or authorized representative who has indicated his/her understanding and acceptance.   Dental Advisory Given  Plan Discussed with: CRNA  Anesthesia Plan Comments:         Anesthesia Quick Evaluation

## 2016-12-05 NOTE — H&P (Signed)
Jonathon Bellows MD [address], [place], [place] [address] Phone: [phone] Fax : [phone]  Primary Care Physician:  Eliezer Lofts, MD Primary Gastroenterologist:  Dr. Jonathon Bellows   Pre-Procedure History & Physical: HPI:  Katie [name] is a 71 y.o. female is here for an endoscopy.   Past Medical History:  Diagnosis Date  . Allergy   . Anemia   . Blood transfusion without reported diagnosis   . Diverticulitis   . DJD (degenerative joint disease)   . DM type 2 (diabetes mellitus, type 2) ([place])   . Fibrocystic breast disease   . GERD (gastroesophageal reflux disease)   . History of chicken pox   . Hypercholesteremia   . Hypertension   . Lichen planopilaris   . Urinary incontinence     Past Surgical History:  Procedure Laterality Date  . BREAST SURGERY  02/1975   Left Breast Biopsy-Fibrocystic Disease  . BREAST SURGERY  10/1981   Bialteral Capsulectomy Breast Surgery  . BREAST SURGERY  07/1998   Replace Bilateral Silicone Implants  . BREAST SURGERY  01/1976   Bilater breast surgery to remove fibrocystic tissue and replace with silicone implants  . CHOLECYSTECTOMY N/A 07/08/2016   Procedure: LAPAROSCOPIC CHOLECYSTECTOMY;  Surgeon: [name], MD;  Location: [facility] ORS;  Service: General;  Laterality: N/A;  . DILATION AND CURETTAGE OF UTERUS  01/1994  . FINGER SURGERY  2001 & 2003   Trigger Finger  . FOOT SURGERY  1980   To Relieve Pinched Nerve  . FOOT SURGERY  07/2008   Left Foot-Plantar Fasciitis  . JOINT REPLACEMENT  04/19/2013   Hip Replacement-Right  . PLACEMENT OF BREAST IMPLANTS  12/26/2004   Replace Failed Implants  . RHINOPLASTY  03/1964   To Correct Deviated Septum    Prior to Admission medications   Medication Sig Start Date End Date Taking? Authorizing Provider  atorvastatin (LIPITOR) 10 MG tablet take 1 tablet by mouth once daily 10/09/16  Yes Amy E Bedsole, MD  hydrochlorothiazide (HYDRODIURIL) 25 MG tablet take 1 tablet by mouth once  daily 10/09/16  Yes Amy E Bedsole, MD  loratadine (CLARITIN) 10 MG tablet Take 10 mg by mouth daily.   Yes Historical Provider, MD  losartan (COZAAR) 100 MG tablet take 1 tablet by mouth once daily 10/09/16  Yes Amy E Bedsole, MD  metFORMIN (GLUCOPHAGE) 500 MG tablet take 1 tablet by mouth once daily 10/09/16  Yes Amy E Bedsole, MD  metroNIDAZOLE (METROCREAM) 0.75 % cream Apply 1 application topically 2 (two) times daily as needed.   Yes Historical Provider, MD  tacrolimus (PROTOPIC) 0.1 % ointment Apply 1 application topically daily as needed.   Yes Historical Provider, MD  triamcinolone cream (KENALOG) 0.1 % Apply 1 application topically 2 (two) times daily. 10/11/16  Yes Amy Cletis Athens, MD  Vitamin D, Ergocalciferol, (DRISDOL) 50000 units CAPS capsule Take 1 capsule (50,000 Units total) by mouth every 7 (seven) days. 10/11/16  Yes Amy Cletis Athens, MD  azelastine (ASTELIN) 0.1 % nasal spray instill 1 spray into each nostril twice a day for 1 week then if needed 07/11/16   [name], MD  Calcium-Vitamin D-Vitamin K (VIACTIV) [phone] MG-UNT-MCG CHEW Chew 1 each by mouth daily.    Historical Provider, MD  cholecalciferol (VITAMIN D) 400 units TABS tablet Take 400 Units by mouth 2 (two) times daily.    Historical Provider, MD  clobetasol (TEMOVATE) 0.05 % external solution Apply 1 application topically. Reported on 08/16/2015 06/02/14  Historical Provider, MD  fluticasone (FLONASE) 50 MCG/ACT nasal spray instill 2 sprays into each nostril once daily 07/05/16   Amy E Bedsole, MD  glucose blood (FREESTYLE LITE) test strip Use as instructed 10/11/16   Amy E Diona Browner, MD  omeprazole (PRILOSEC) 40 MG capsule Take 1 capsule (40 mg total) by mouth daily. Patient not taking: Reported on 12/05/2016 11/26/16 12/26/16  Jonathon Bellows, MD  valACYclovir (VALTREX) 500 MG tablet For initial flare 2 tab twice daily x 7 days. For future flares can treat with 1 tab twice daily x 3 days Patient not taking: Reported on 12/05/2016  10/15/16   [name], MD    Allergies as of 11/26/2016 - Review Complete 11/26/2016  Allergen Reaction Noted  . Erythromycin Anaphylaxis 06/26/2009  . Ciprofloxacin  06/26/2009  . Daypro [oxaprozin]  10/13/2012  . Enalapril maleate Nausea Only 10/06/2015  . Nsaids  10/13/2012  . Vioxx [rofecoxib]  10/13/2012    Family History  Problem Relation Age of Onset  . Arthritis Mother   . Hypertension Mother   . Heart disease Father   . Diabetes Father   . Breast cancer Sister   . Hypertension Brother   . Leukemia Brother   . Coronary artery disease Brother     Social History   Social History  . Marital status: Married    Spouse name: N/A  . Number of children: 1  . Years of education: N/A   Occupational History  . Retired    Social History Main Topics  . Smoking status: Never Smoker  . Smokeless tobacco: Never Used  . Alcohol use No  . Drug use: No  . Sexual activity: No   Other Topics Concern  . Not on file   Social History Narrative   Married for 37 years.    She has one child and one stepston.          Review of Systems: See HPI, otherwise negative ROS  Physical Exam: BP (!) 129/47   Pulse 83   Temp 97.3 F (36.3 C) (Tympanic)   Resp 16   Ht 5\' 2"  (1.575 m)   Wt 170 lb (77.1 kg)   LMP 08/26/1992 (Approximate)   SpO2 97%   BMI 31.09 kg/m  General:   Alert,  pleasant and cooperative in NAD Head:  Normocephalic and atraumatic. Neck:  Supple; no masses or thyromegaly. Lungs:  Clear throughout to auscultation.    Heart:  Regular rate and rhythm. Abdomen:  Soft, nontender and nondistended. Normal bowel sounds, without guarding, and without rebound.   Neurologic:  Alert and  oriented x4;  grossly normal neurologically.  Impression/Plan: Katie [name] is here for an endoscopy to be performed for abdominal pain   Risks, benefits, limitations, and alternatives regarding  endoscopy have been reviewed with the patient.  Questions have been answered.   All parties agreeable.   Jonathon Bellows, MD  12/05/2016, 8:19 AM

## 2016-12-05 NOTE — Anesthesia Post-op Follow-up Note (Cosign Needed)
Anesthesia QCDR form completed.        

## 2016-12-06 ENCOUNTER — Encounter: Payer: Self-pay | Admitting: Gastroenterology

## 2016-12-09 LAB — SURGICAL PATHOLOGY

## 2016-12-10 ENCOUNTER — Other Ambulatory Visit: Payer: Self-pay

## 2016-12-10 ENCOUNTER — Encounter: Payer: Self-pay | Admitting: Gastroenterology

## 2016-12-18 ENCOUNTER — Other Ambulatory Visit: Payer: Self-pay

## 2016-12-18 ENCOUNTER — Encounter: Payer: Self-pay | Admitting: Gastroenterology

## 2016-12-18 ENCOUNTER — Ambulatory Visit (INDEPENDENT_AMBULATORY_CARE_PROVIDER_SITE_OTHER): Payer: Medicare Other | Admitting: Gastroenterology

## 2016-12-18 DIAGNOSIS — R1013 Epigastric pain: Secondary | ICD-10-CM | POA: Diagnosis not present

## 2016-12-18 MED ORDER — OMEPRAZOLE 40 MG PO CPDR: 40.0000 mg | DELAYED_RELEASE_CAPSULE | Freq: Every day | ORAL | 0 refills | Status: DC

## 2016-12-18 NOTE — Progress Notes (Signed)
Primary Care Physician: Eliezer Lofts, MD  Primary Gastroenterologist:  Dr. Jonathon Bellows   No chief complaint on file.   HPI: Katie [name] is a 71 y.o. female   She is here today to follow up to her initial visit on 11/26/16 when she was seen for Dyspepsia.   Summary of history :  She has seen surgery for her RUQ pain , underwent a cholecystectomy in 06/2017 . Despite which continues to have the pain.RUQ post prandial , few times a week, bloating , ,localized Since it was persistent Dr Adonis Huguenin he wanted that patient to see GI to evaluate for peptic ulcers. RUQ USG 10/2016 was normal with only post operative changes. LFT's normal 10/2016    Interval history   11/26/2016-  12/18/2016   She underwent an EGD on 12/05/16 which showed antral gastritis with negative for H pylori which was confirmed on biopsy.   Not on PPI,Still using artificial sugars. Presently has more pain than bloating.   Current Outpatient Prescriptions  Medication Sig Dispense Refill  . atorvastatin (LIPITOR) 10 MG tablet take 1 tablet by mouth once daily 90 tablet 3  . azelastine (ASTELIN) 0.1 % nasal spray instill 1 spray into each nostril twice a day for 1 week then if needed 30 mL 5  . Calcium-Vitamin D-Vitamin K (VIACTIV) 347-425-95 MG-UNT-MCG CHEW Chew 1 each by mouth daily.    . cholecalciferol (VITAMIN D) 400 units TABS tablet Take 400 Units by mouth 2 (two) times daily.    . clobetasol (TEMOVATE) 0.05 % external solution Apply 1 application topically. Reported on 08/16/2015    . fluticasone (FLONASE) 50 MCG/ACT nasal spray instill 2 sprays into each nostril once daily 16 g 5  . glucose blood (FREESTYLE LITE) test strip Use as instructed 50 each 12  . hydrochlorothiazide (HYDRODIURIL) 25 MG tablet take 1 tablet by mouth once daily 90 tablet 3  . loratadine (CLARITIN) 10 MG tablet Take 10 mg by mouth daily.    [name] losartan (COZAAR) 100 MG tablet take 1 tablet by mouth once daily 90 tablet 3  . metFORMIN  (GLUCOPHAGE) 500 MG tablet take 1 tablet by mouth once daily 90 tablet 3  . metroNIDAZOLE (METROCREAM) 0.75 % cream Apply 1 application topically 2 (two) times daily as needed.    [name] omeprazole (PRILOSEC) 40 MG capsule Take 1 capsule (40 mg total) by mouth daily. (Patient not taking: Reported on 12/05/2016) 30 capsule 0  . tacrolimus (PROTOPIC) 0.1 % ointment Apply 1 application topically daily as needed.    . triamcinolone cream (KENALOG) 0.1 % Apply 1 application topically 2 (two) times daily. 15 g 0  . valACYclovir (VALTREX) 500 MG tablet For initial flare 2 tab twice daily x 7 days. For future flares can treat with 1 tab twice daily x 3 days (Patient not taking: Reported on 12/05/2016) 40 tablet 0  . Vitamin D, Ergocalciferol, (DRISDOL) 50000 units CAPS capsule Take 1 capsule (50,000 Units total) by mouth every 7 (seven) days. 12 capsule 0   No current facility-administered medications for this visit.     Allergies as of 12/18/2016 - Review Complete 12/05/2016  Allergen Reaction Noted  . Erythromycin Anaphylaxis 06/26/2009  . Ciprofloxacin  06/26/2009  . Daypro [oxaprozin]  10/13/2012  . Enalapril maleate Nausea Only 10/06/2015  . Nsaids  10/13/2012  . Vioxx [rofecoxib]  10/13/2012    ROS:  General: Negative for anorexia, weight loss, fever, chills, fatigue, weakness. ENT: Negative for hoarseness, difficulty swallowing , nasal  congestion. CV: Negative for chest pain, angina, palpitations, dyspnea on exertion, peripheral edema.  Respiratory: Negative for dyspnea at rest, dyspnea on exertion, cough, sputum, wheezing.  GI: See history of present illness. GU:  Negative for dysuria, hematuria, urinary incontinence, urinary frequency, nocturnal urination.  Endo: Negative for unusual weight change.    Physical Examination:   LMP 08/26/1992 (Approximate)   General: Well-nourished, well-developed in no acute distress.  Eyes: No icterus. Conjunctivae pink. Mouth: Oropharyngeal mucosa  moist and pink , no lesions erythema or exudate. Lungs: Clear to auscultation bilaterally. Non-labored. Heart: Regular rate and rhythm, no murmurs rubs or gallops.  Abdomen: Bowel sounds are normal, nontender, nondistended, no hepatosplenomegaly or masses, no abdominal bruits or hernia , no rebound or guarding.   Extremities: No lower extremity edema. No clubbing or deformities. Neuro: Alert and oriented x 3.  Grossly intact. Skin: Warm and dry, no jaundice.   Psych: Alert and cooperative, normal mood and affect.   Imaging Studies: No results found.  Assessment and Plan:   TAYLIA [name] is a 71 y.o. y/o female to follow up for RUQ pain, the history is suggestive of gas/bloating secondary to foods she eats which is causing her the pain. She still continues artificial sugars and has not used the PPI as yet . She will do both from today . If no better at next visit will need MRCP to r/o stones in the CBD.   Plan   1. PPI 2. Stop artificial sugars, trial of IB guard,samples provided  3. Low FODMAP diet.  Dr Jonathon Bellows  MD Follow up in 3 months

## 2016-12-31 ENCOUNTER — Ambulatory Visit: Payer: Medicare Other | Admitting: Gastroenterology

## 2017-02-19 DIAGNOSIS — E119 Type 2 diabetes mellitus without complications: Secondary | ICD-10-CM | POA: Diagnosis not present

## 2017-02-19 DIAGNOSIS — M1712 Unilateral primary osteoarthritis, left knee: Secondary | ICD-10-CM | POA: Diagnosis not present

## 2017-02-19 DIAGNOSIS — E669 Obesity, unspecified: Secondary | ICD-10-CM | POA: Diagnosis not present

## 2017-02-28 DIAGNOSIS — L661 Lichen planopilaris: Secondary | ICD-10-CM | POA: Diagnosis not present

## 2017-03-03 DIAGNOSIS — L661 Lichen planopilaris: Secondary | ICD-10-CM | POA: Diagnosis not present

## 2017-03-07 DIAGNOSIS — M1711 Unilateral primary osteoarthritis, right knee: Secondary | ICD-10-CM | POA: Diagnosis not present

## 2017-03-19 ENCOUNTER — Other Ambulatory Visit: Payer: Self-pay

## 2017-03-19 ENCOUNTER — Encounter: Payer: Self-pay | Admitting: Gastroenterology

## 2017-03-19 ENCOUNTER — Ambulatory Visit (INDEPENDENT_AMBULATORY_CARE_PROVIDER_SITE_OTHER): Payer: Medicare Other | Admitting: Gastroenterology

## 2017-03-19 VITALS — BP 134/92 | HR 84 | Temp 98.2°F | Ht 62.0 in | Wt 170.6 lb

## 2017-03-19 DIAGNOSIS — R1013 Epigastric pain: Secondary | ICD-10-CM

## 2017-03-19 NOTE — Progress Notes (Signed)
Jonathon Bellows MD, MRCP(U.K) [address]  [place], [place] [address]  Main: [phone]  Fax: [phone]   Primary Care Physician: [name], MD  Primary Gastroenterologist:  Dr. Jonathon Bellows   No chief complaint on file.   HPI: Katie [name] is a 71 y.o. female   She is here today to follow up to her initial visit on 12/18/16 when she was seen for Dyspepsia.   Summary of history :  She has seen surgery for her RUQ pain , underwent a cholecystectomy in 06/2017 . Despite which continues to have the pain.RUQ post prandial , few times a week, bloating , ,localized Since it was persistent Dr Adonis Huguenin referred  GI to evaluate for peptic ulcers. RUQ USG 10/2016 was normal with only post operative changes. LFT's normal 10/2016 . EGD on 12/05/16 which showed antral gastritis with negative for H pylori which was confirmed on biopsy.   Interval history   12/18/2016 -03/19/17   No pain since last visit. Stopped using artificial sugars. Compliant with FODMAP diet and has figured the foods which cause her bloating , used to consume a lot of salads. Uses IB guard which helps . Lost 3 lbs intentionally. Overall doing well.   Current Outpatient Prescriptions  Medication Sig Dispense Refill  . atorvastatin (LIPITOR) 10 MG tablet take 1 tablet by mouth once daily 90 tablet 3  . azelastine (ASTELIN) 0.1 % nasal spray instill 1 spray into each nostril twice a day for 1 week then if needed 30 mL 5  . Calcium-Vitamin D-Vitamin K (VIACTIV) 144-315-40 MG-UNT-MCG CHEW Chew 1 each by mouth daily.    . celecoxib (CELEBREX) 200 MG capsule     . Cholecalciferol (D3 ADULT PO) Take 1 tablet by mouth daily.    . cholecalciferol (VITAMIN D) 400 units TABS tablet Take 400 Units by mouth 2 (two) times daily.    . clobetasol (TEMOVATE) 0.05 % external solution Apply 1 application topically. Reported on 08/16/2015    . fluticasone (FLONASE) 50 MCG/ACT nasal spray instill 2 sprays into each  nostril once daily 16 g 5  . glucose blood (FREESTYLE LITE) test strip Use as instructed 50 each 12  . hydrochlorothiazide (HYDRODIURIL) 25 MG tablet take 1 tablet by mouth once daily 90 tablet 3  . loratadine (CLARITIN) 10 MG tablet Take 10 mg by mouth daily.    [name] losartan (COZAAR) 100 MG tablet take 1 tablet by mouth once daily 90 tablet 3  . metFORMIN (GLUCOPHAGE) 500 MG tablet take 1 tablet by mouth once daily 90 tablet 3  . metroNIDAZOLE (METROCREAM) 0.75 % cream Apply 1 application topically 2 (two) times daily as needed.    [name] omeprazole (PRILOSEC) 40 MG capsule Take 1 capsule (40 mg total) by mouth daily. 30 capsule 0  . tacrolimus (PROTOPIC) 0.1 % ointment Apply 1 application topically daily as needed.    . triamcinolone cream (KENALOG) 0.1 % Apply 1 application topically 2 (two) times daily. 15 g 0  . valACYclovir (VALTREX) 500 MG tablet For initial flare 2 tab twice daily x 7 days. For future flares can treat with 1 tab twice daily x 3 days 40 tablet 0  . Vitamin D, Ergocalciferol, (DRISDOL) 50000 units CAPS capsule Take 1 capsule (50,000 Units total) by mouth every 7 (seven) days. 12 capsule 0   No current facility-administered medications for this visit.     Allergies as of 03/19/2017 - Review Complete 12/18/2016  Allergen Reaction Noted  .  Erythromycin Anaphylaxis 06/26/2009  . Ciprofloxacin Rash 06/26/2009  . Daypro [oxaprozin]  10/13/2012  . Enalapril maleate Nausea Only 10/06/2015  . Nsaids Nausea Only 10/13/2012  . Vioxx [rofecoxib]  10/13/2012    ROS:  General: Negative for anorexia, weight loss, fever, chills, fatigue, weakness. ENT: Negative for hoarseness, difficulty swallowing , nasal congestion. CV: Negative for chest pain, angina, palpitations, dyspnea on exertion, peripheral edema.  Respiratory: Negative for dyspnea at rest, dyspnea on exertion, cough, sputum, wheezing.  GI: See history of present illness. GU:  Negative for dysuria, hematuria, urinary  incontinence, urinary frequency, nocturnal urination.  Endo: Negative for unusual weight change.    Physical Examination:   LMP 08/26/1992 (Approximate)   General: Well-nourished, well-developed in no acute distress.  Eyes: No icterus. Conjunctivae pink. Mouth: Oropharyngeal mucosa moist and pink , no lesions erythema or exudate. Lungs: Clear to auscultation bilaterally. Non-labored. Heart: Regular rate and rhythm, no murmurs rubs or gallops.  Abdomen: Bowel sounds are normal, nontender, nondistended, no hepatosplenomegaly or masses, no abdominal bruits or hernia , no rebound or guarding.   Extremities: No lower extremity edema. No clubbing or deformities. Neuro: Alert and oriented x 3.  Grossly intact. Skin: Warm and dry, no jaundice.   Psych: Alert and cooperative, normal mood and affect.   Imaging Studies: No results found.  Assessment and Plan:   Katie [name] is a 71 y.o. y/o female  Here  to follow up for RUQ pain, the history is suggestive of gas/bloating secondary to foods she eats which is causing her the pain. Symptoms resolved after d/c artificial sugars, using a FODMAP diet and PRN IB guard.  Plan   1. Stop PPI  2. PRN IB guard or peppermint tea 3. FODMAP diet     Dr Jonathon Bellows  MD,MRCP [facility]) Follow up as needed

## 2017-04-08 ENCOUNTER — Other Ambulatory Visit (INDEPENDENT_AMBULATORY_CARE_PROVIDER_SITE_OTHER): Payer: Medicare Other

## 2017-04-08 ENCOUNTER — Telehealth: Payer: Self-pay | Admitting: Family Medicine

## 2017-04-08 DIAGNOSIS — E119 Type 2 diabetes mellitus without complications: Secondary | ICD-10-CM

## 2017-04-08 LAB — LIPID PANEL
Cholesterol: 165 mg/dL (ref 0–200)
HDL: 34 mg/dL — ABNORMAL LOW (ref >39.00)
Total CHOL/HDL Ratio: 5

## 2017-04-08 LAB — COMPREHENSIVE METABOLIC PANEL
ALBUMIN: 4.3 g/dL (ref 3.5–5.2)
ALT: 31 U/L (ref 0–35)
AST: 30 U/L (ref 0–37)
Alkaline Phosphatase: 61 U/L (ref 39–117)
BUN: 19 mg/dL (ref 6–23)
CHLORIDE: 98 meq/L (ref 96–112)
CO2: 28 meq/L (ref 19–32)
CREATININE: 0.88 mg/dL (ref 0.40–1.20)
Calcium: 9.4 mg/dL (ref 8.4–10.5)
GFR: 67.07 mL/min (ref >60.00)
Glucose, Bld: 160 mg/dL — ABNORMAL HIGH (ref 70–99)
Potassium: 3.7 mEq/L (ref 3.5–5.1)
SODIUM: 134 meq/L — AB (ref 135–145)
Total Bilirubin: 0.5 mg/dL (ref 0.2–1.2)
Total Protein: 6.9 g/dL (ref 6.0–8.3)

## 2017-04-08 LAB — LDL CHOLESTEROL, DIRECT: LDL DIRECT: 77 mg/dL

## 2017-04-08 LAB — HEMOGLOBIN A1C: Hgb A1c MFr Bld: 7.1 % — ABNORMAL HIGH (ref 4.6–6.5)

## 2017-04-08 NOTE — Telephone Encounter (Signed)
-----   Message from Ellamae Sia sent at 04/03/2017 10:15 AM EDT ----- Regarding: Lab orders for Tuesday, 8.14.18 Lab orders for a f/u DM appt

## 2017-04-10 DIAGNOSIS — H43813 Vitreous degeneration, bilateral: Secondary | ICD-10-CM | POA: Diagnosis not present

## 2017-04-11 ENCOUNTER — Encounter: Payer: Self-pay | Admitting: Family Medicine

## 2017-04-11 ENCOUNTER — Ambulatory Visit (INDEPENDENT_AMBULATORY_CARE_PROVIDER_SITE_OTHER): Payer: Medicare Other | Admitting: Family Medicine

## 2017-04-11 VITALS — BP 110/60 | HR 76 | Temp 97.7°F | Ht 62.0 in | Wt 170.8 lb

## 2017-04-11 DIAGNOSIS — E7800 Pure hypercholesterolemia, unspecified: Secondary | ICD-10-CM

## 2017-04-11 DIAGNOSIS — Z23 Encounter for immunization: Secondary | ICD-10-CM | POA: Diagnosis not present

## 2017-04-11 DIAGNOSIS — E119 Type 2 diabetes mellitus without complications: Secondary | ICD-10-CM

## 2017-04-11 DIAGNOSIS — R0789 Other chest pain: Secondary | ICD-10-CM | POA: Diagnosis not present

## 2017-04-11 DIAGNOSIS — I10 Essential (primary) hypertension: Secondary | ICD-10-CM | POA: Diagnosis not present

## 2017-04-11 LAB — HM DIABETES FOOT EXAM

## 2017-04-11 NOTE — Patient Instructions (Signed)
Keep working on low carb diet, increase exercise as tolearated.

## 2017-04-11 NOTE — Assessment & Plan Note (Signed)
LDL at goal.. Triglycerides worse. Decrease carbs and fat in diet, increase activity back up as able. Recheck in 3 months.

## 2017-04-11 NOTE — Assessment & Plan Note (Signed)
Well controlled. Continue current medication. Encouraged exercise, weight loss, healthy eating habits.  

## 2017-04-11 NOTE — Progress Notes (Signed)
Subjective:    Patient ID: Katie [name], female    DOB: 1946-02-03, 71 y.o.   MRN: [id number]  HPI   71 year old female presents for DM follow up.   Diabetes:   Worsened control on metformin. She has had a cortisone shot in bilateral knees in last 3 months. Lab Results  Component Value Date   HGBA1C 7.1 (H) 04/08/2017  Using medications without difficulties: Hypoglycemic episodes: none Hyperglycemic episodes:  After  Feet problems: none Blood Sugars averaging: FBS 110-142 eye exam within last year: yes   Wt Readings from Last 3 Encounters:  04/11/17 170 lb 12.8 oz (77.5 kg)  03/19/17 170 lb 9.6 oz (77.4 kg)  12/18/16 173 lb 6.4 oz (78.7 kg)     Elevated Cholesterol:  LDL at goal on lipitor 10 , but trigs high. Lab Results  Component Value Date   CHOL 165 04/08/2017   HDL 34.00 (L) 04/08/2017   LDLCALC 79 04/01/2016   LDLDIRECT 77.0 04/08/2017   TRIG (H) 04/08/2017    437.0 Triglyceride is over 400; calculations on Lipids are invalid.   CHOLHDL 5 04/08/2017  Using medications without problems:one Muscle aches: none Diet compliance: Exercise: She has been less active given bilateral knee pain. Other complaints:  Hypertension:    Good control on losartan HCTZ BP Readings from Last 3 Encounters:  04/11/17 110/60  03/19/17 (!) 134/92  12/18/16 (!) 132/57  Using medication without problems or lightheadedness:  Chest pain with exertion: none.[name] occ chest tightness at rest,  Edema: none Short of breath: mild Average home BPs: 120/70 Other issues: She has been able to exercise without issues    GI dx with gastritis and erosions in gastric antrumr.. Treated with PPI. Resolved.  Blood pressure 110/60, pulse 76, temperature 97.7 F (36.5 C), temperature source Oral, height 5\' 2"  (1.575 m), weight 170 lb 12.8 oz (77.5 kg), last menstrual period 08/26/1992, SpO2 94 %.  Review of Systems  Constitutional: Negative for fatigue and fever.  HENT: Negative for ear pain.    Eyes: Negative for pain.  Respiratory: Negative for chest tightness and shortness of breath.   Cardiovascular: Negative for chest pain, palpitations and leg swelling.  Gastrointestinal: Negative for abdominal pain.  Genitourinary: Negative for dysuria.       Objective:   Physical Exam  Constitutional: Vital signs are normal. She appears well-developed and well-nourished. She is cooperative.  Non-toxic appearance. She does not appear ill. No distress.  overweight  HENT:  Head: Normocephalic.  Right Ear: Hearing, tympanic membrane, external ear and ear canal normal. Tympanic membrane is not erythematous, not retracted and not bulging.  Left Ear: Hearing, tympanic membrane, external ear and ear canal normal. Tympanic membrane is not erythematous, not retracted and not bulging.  Nose: No mucosal edema or rhinorrhea. Right sinus exhibits no maxillary sinus tenderness and no frontal sinus tenderness. Left sinus exhibits no maxillary sinus tenderness and no frontal sinus tenderness.  Mouth/Throat: Uvula is midline, oropharynx is clear and moist and mucous membranes are normal.  Eyes: Pupils are equal, round, and reactive to light. Conjunctivae, EOM and lids are normal. Lids are everted and swept, no foreign bodies found.  Neck: Trachea normal and normal range of motion. Neck supple. Carotid bruit is not present. No thyroid mass and no thyromegaly present.  Cardiovascular: Normal rate, regular rhythm, S1 normal, S2 normal, normal heart sounds, intact distal pulses and normal pulses.  Exam reveals no gallop and no friction rub.   No murmur  heard. Pulmonary/Chest: Effort normal and breath sounds normal. No tachypnea. No respiratory distress. She has no decreased breath sounds. She has no wheezes. She has no rhonchi. She has no rales.  Abdominal: Soft. Normal appearance and bowel sounds are normal. There is no tenderness.  Neurological: She is alert.  Skin: Skin is warm, dry and intact. No rash  noted.  Psychiatric: Her speech is normal and behavior is normal. Judgment and thought content normal. Her mood appears not anxious. Cognition and memory are normal. She does not exhibit a depressed mood.   Diabetic foot exam: Normal inspection No skin breakdown No calluses  Normal DP pulses Normal sensation to light touch and monofilament Nails normal        Assessment & Plan:

## 2017-04-11 NOTE — Assessment & Plan Note (Signed)
Worsened control likely due to stress, decreased activity and recent steroid injections.

## 2017-04-11 NOTE — Assessment & Plan Note (Addendum)
Most likely due to stress, msk strain. No clear cardiopulmonary cause.  EKG today: unremarkable , NSR, no ST changes, no LVH

## 2017-04-25 DIAGNOSIS — L661 Lichen planopilaris: Secondary | ICD-10-CM | POA: Diagnosis not present

## 2017-04-28 DIAGNOSIS — R1032 Left lower quadrant pain: Secondary | ICD-10-CM | POA: Diagnosis not present

## 2017-06-25 DIAGNOSIS — L661 Lichen planopilaris: Secondary | ICD-10-CM | POA: Diagnosis not present

## 2017-07-08 ENCOUNTER — Telehealth: Payer: Self-pay | Admitting: Family Medicine

## 2017-07-08 DIAGNOSIS — E119 Type 2 diabetes mellitus without complications: Secondary | ICD-10-CM

## 2017-07-08 DIAGNOSIS — E559 Vitamin D deficiency, unspecified: Secondary | ICD-10-CM

## 2017-07-08 NOTE — Telephone Encounter (Signed)
-----   Message from Ellamae Sia sent at 06/30/2017  3:13 PM EST ----- Regarding: Lab orders for Wednesday, 11.14.18 Lab orders for a DM f/u

## 2017-07-09 ENCOUNTER — Other Ambulatory Visit (INDEPENDENT_AMBULATORY_CARE_PROVIDER_SITE_OTHER): Payer: Medicare Other

## 2017-07-09 DIAGNOSIS — E119 Type 2 diabetes mellitus without complications: Secondary | ICD-10-CM | POA: Diagnosis not present

## 2017-07-09 LAB — HEMOGLOBIN A1C: Hgb A1c MFr Bld: 6.4 % (ref 4.6–6.5)

## 2017-07-10 ENCOUNTER — Other Ambulatory Visit (INDEPENDENT_AMBULATORY_CARE_PROVIDER_SITE_OTHER): Payer: Medicare Other

## 2017-07-10 DIAGNOSIS — E785 Hyperlipidemia, unspecified: Secondary | ICD-10-CM

## 2017-07-10 LAB — LIPID PANEL
CHOL/HDL RATIO: 4
Cholesterol: 145 mg/dL (ref 0–200)
HDL: 34.1 mg/dL — AB (ref >39.00)
NONHDL: 110.78
TRIGLYCERIDES: 267 mg/dL — AB (ref 0.0–149.0)
VLDL: 53.4 mg/dL — AB (ref 0.0–40.0)

## 2017-07-10 LAB — LDL CHOLESTEROL, DIRECT: LDL DIRECT: 71 mg/dL

## 2017-07-11 ENCOUNTER — Encounter: Payer: Self-pay | Admitting: Family Medicine

## 2017-07-11 ENCOUNTER — Ambulatory Visit (INDEPENDENT_AMBULATORY_CARE_PROVIDER_SITE_OTHER): Payer: Medicare Other | Admitting: Family Medicine

## 2017-07-11 VITALS — BP 110/60 | HR 62 | Temp 97.7°F | Ht 62.0 in | Wt 169.5 lb

## 2017-07-11 DIAGNOSIS — I10 Essential (primary) hypertension: Secondary | ICD-10-CM | POA: Diagnosis not present

## 2017-07-11 DIAGNOSIS — E119 Type 2 diabetes mellitus without complications: Secondary | ICD-10-CM

## 2017-07-11 DIAGNOSIS — E7800 Pure hypercholesterolemia, unspecified: Secondary | ICD-10-CM | POA: Diagnosis not present

## 2017-07-11 NOTE — Assessment & Plan Note (Signed)
Well controlled. Continue current medication.  

## 2017-07-11 NOTE — Patient Instructions (Addendum)
Work on low cholesterol and fat doet. Increase activity and veggie fats in diet.

## 2017-07-11 NOTE — Assessment & Plan Note (Signed)
Trigs improved but not at goal. Dicussed was to increase HDl. LDL at goal on statin.

## 2017-07-11 NOTE — Progress Notes (Signed)
   Subjective:    Patient ID: Katie [name], female    DOB: August 03, 1946, 71 y.o.   MRN: [id number]  HPI    71 year old female presents for follow up DM.  Diabetes:   Good control on metformin Lab Results  Component Value Date   HGBA1C 6.4 07/09/2017  Using medications without difficulties: Hypoglycemic episodes: Hyperglycemic episodes: Feet problems: none Blood Sugars averaging: not checking eye exam within last year: 07/2016  Elevated Cholesterol:  At goal on atorvastatin 10 mg daily Lab Results  Component Value Date   CHOL 145 07/10/2017   HDL 34.10 (L) 07/10/2017   LDLCALC 79 04/01/2016   LDLDIRECT 71.0 07/10/2017   TRIG 267.0 (H) 07/10/2017   CHOLHDL 4 07/10/2017  Using medications without problems: Muscle aches:  Diet compliance: working on low gas diet Exercise: walking Other complaints:  Hypertension:   Good control on losartan, HCTZ  Using medication without problems or lightheadedness:  none Chest pain with exertion: none Edema:none Short of breath: none Average home BPs: 110/70 Other issues:   Looser stool in last few days, relieved bu BM.  Blood pressure 110/60, pulse 62, temperature 97.7 F (36.5 C), temperature source Oral, height 5\' 2"  (1.575 m), weight 169 lb 8 oz (76.9 kg), last menstrual period 08/26/1992.  Review of Systems  Gastrointestinal: Positive for diarrhea.       Chronic RUQ pain.[name] eval completed       Objective:   Physical Exam  Constitutional: Vital signs are normal. She appears well-developed and well-nourished. She is cooperative.  Non-toxic appearance. She does not appear ill. No distress.  HENT:  Head: Normocephalic.  Right Ear: Hearing, tympanic membrane, external ear and ear canal normal. Tympanic membrane is not erythematous, not retracted and not bulging.  Left Ear: Hearing, tympanic membrane, external ear and ear canal normal. Tympanic membrane is not erythematous, not retracted and not bulging.  Nose: No mucosal edema  or rhinorrhea. Right sinus exhibits no maxillary sinus tenderness and no frontal sinus tenderness. Left sinus exhibits no maxillary sinus tenderness and no frontal sinus tenderness.  Mouth/Throat: Uvula is midline, oropharynx is clear and moist and mucous membranes are normal.  Eyes: Conjunctivae, EOM and lids are normal. Pupils are equal, round, and reactive to light. Lids are everted and swept, no foreign bodies found.  Neck: Trachea normal and normal range of motion. Neck supple. Carotid bruit is not present. No thyroid mass and no thyromegaly present.  Cardiovascular: Normal rate, regular rhythm, S1 normal, S2 normal, normal heart sounds, intact distal pulses and normal pulses. Exam reveals no gallop and no friction rub.  No murmur heard. Pulmonary/Chest: Effort normal and breath sounds normal. No tachypnea. No respiratory distress. She has no decreased breath sounds. She has no wheezes. She has no rhonchi. She has no rales.  Abdominal: Soft. Normal appearance and bowel sounds are normal. There is no tenderness.  Neurological: She is alert.  Skin: Skin is warm, dry and intact. No rash noted.  Psychiatric: Her speech is normal and behavior is normal. Judgment and thought content normal. Her mood appears not anxious. Cognition and memory are normal. She does not exhibit a depressed mood.      Diabetic foot exam: Normal inspection No skin breakdown No calluses  Normal DP pulses Normal sensation to light touch and monofilament Nails normal     Assessment & Plan:

## 2017-07-15 ENCOUNTER — Ambulatory Visit: Payer: Medicare Other | Admitting: Family Medicine

## 2017-08-27 DIAGNOSIS — Z1231 Encounter for screening mammogram for malignant neoplasm of breast: Secondary | ICD-10-CM | POA: Diagnosis not present

## 2017-08-27 DIAGNOSIS — Z803 Family history of malignant neoplasm of breast: Secondary | ICD-10-CM | POA: Diagnosis not present

## 2017-09-02 DIAGNOSIS — E119 Type 2 diabetes mellitus without complications: Secondary | ICD-10-CM | POA: Diagnosis not present

## 2017-09-02 DIAGNOSIS — E669 Obesity, unspecified: Secondary | ICD-10-CM | POA: Insufficient documentation

## 2017-09-02 DIAGNOSIS — M25562 Pain in left knee: Secondary | ICD-10-CM | POA: Diagnosis not present

## 2017-09-03 ENCOUNTER — Encounter: Payer: Self-pay | Admitting: Family Medicine

## 2017-09-05 ENCOUNTER — Other Ambulatory Visit: Payer: Self-pay | Admitting: Family Medicine

## 2017-09-12 DIAGNOSIS — H43813 Vitreous degeneration, bilateral: Secondary | ICD-10-CM | POA: Diagnosis not present

## 2017-09-12 LAB — HM DIABETES EYE EXAM

## 2017-09-17 ENCOUNTER — Encounter: Payer: Self-pay | Admitting: Family Medicine

## 2017-10-04 DIAGNOSIS — R1032 Left lower quadrant pain: Secondary | ICD-10-CM | POA: Diagnosis not present

## 2017-10-04 DIAGNOSIS — K5900 Constipation, unspecified: Secondary | ICD-10-CM | POA: Diagnosis not present

## 2017-10-10 ENCOUNTER — Telehealth: Payer: Self-pay | Admitting: Family Medicine

## 2017-10-10 DIAGNOSIS — E119 Type 2 diabetes mellitus without complications: Secondary | ICD-10-CM

## 2017-10-10 DIAGNOSIS — M8580 Other specified disorders of bone density and structure, unspecified site: Secondary | ICD-10-CM

## 2017-10-10 DIAGNOSIS — E559 Vitamin D deficiency, unspecified: Secondary | ICD-10-CM

## 2017-10-10 NOTE — Telephone Encounter (Signed)
-----   Message from Eustace Pen, LPN sent at 04/01/5783 11:15 PM EST ----- Regarding: Labs 2/15 Lab orders needed. Thank you.  Insurance:  [employer]

## 2017-10-13 ENCOUNTER — Ambulatory Visit (INDEPENDENT_AMBULATORY_CARE_PROVIDER_SITE_OTHER): Payer: Medicare Other

## 2017-10-13 VITALS — BP 124/70 | HR 84 | Temp 97.8°F | Ht 62.0 in | Wt 165.0 lb

## 2017-10-13 DIAGNOSIS — E559 Vitamin D deficiency, unspecified: Secondary | ICD-10-CM | POA: Diagnosis not present

## 2017-10-13 DIAGNOSIS — Z0000 Encounter for general adult medical examination without abnormal findings: Secondary | ICD-10-CM | POA: Diagnosis not present

## 2017-10-13 DIAGNOSIS — E119 Type 2 diabetes mellitus without complications: Secondary | ICD-10-CM | POA: Diagnosis not present

## 2017-10-13 LAB — COMPREHENSIVE METABOLIC PANEL
ALBUMIN: 4.2 g/dL (ref 3.5–5.2)
ALK PHOS: 60 U/L (ref 39–117)
ALT: 23 U/L (ref 0–35)
AST: 18 U/L (ref 0–37)
BUN: 25 mg/dL — ABNORMAL HIGH (ref 6–23)
CO2: 28 meq/L (ref 19–32)
CREATININE: 0.91 mg/dL (ref 0.40–1.20)
Calcium: 9.4 mg/dL (ref 8.4–10.5)
Chloride: 101 mEq/L (ref 96–112)
GFR: 64.43 mL/min (ref >60.00)
Glucose, Bld: 130 mg/dL — ABNORMAL HIGH (ref 70–99)
Potassium: 4 mEq/L (ref 3.5–5.1)
Sodium: 138 mEq/L (ref 135–145)
TOTAL PROTEIN: 7.2 g/dL (ref 6.0–8.3)
Total Bilirubin: 0.5 mg/dL (ref 0.2–1.2)

## 2017-10-13 LAB — LIPID PANEL
CHOLESTEROL: 150 mg/dL (ref 0–200)
HDL: 34.4 mg/dL — AB (ref >39.00)
NONHDL: 115.53
Total CHOL/HDL Ratio: 4
Triglycerides: 208 mg/dL — ABNORMAL HIGH (ref 0.0–149.0)
VLDL: 41.6 mg/dL — ABNORMAL HIGH (ref 0.0–40.0)

## 2017-10-13 LAB — VITAMIN D 25 HYDROXY (VIT D DEFICIENCY, FRACTURES): VITD: 42.97 ng/mL (ref 30.00–100.00)

## 2017-10-13 LAB — HEMOGLOBIN A1C: Hgb A1c MFr Bld: 6.9 % — ABNORMAL HIGH (ref 4.6–6.5)

## 2017-10-13 LAB — LDL CHOLESTEROL, DIRECT: Direct LDL: 84 mg/dL

## 2017-10-13 NOTE — Progress Notes (Signed)
Pre visit review using our clinic review tool, if applicable. No additional management support is needed unless otherwise documented below in the visit note. 

## 2017-10-13 NOTE — Progress Notes (Signed)
PCP notes:   Health maintenance:  A1C - completed  Abnormal screenings:   None  Patient concerns:   None  Nurse concerns:  None  Next PCP appt:   10/17/2017 @ 1000

## 2017-10-13 NOTE — Patient Instructions (Addendum)
Katie [name] , Thank you for taking time to come for your Medicare Wellness Visit. I appreciate your ongoing commitment to your health goals. Please review the following plan we discussed and let me know if I can assist you in the future.   These are the goals we discussed: Goals    . Increase physical activity     Starting 10/13/2017, I will continue to exercise for 60-90 minutes 3 days per week.        This is a list of the screening recommended for you and due dates:  Health Maintenance  Topic Date Due  . Hemoglobin A1C  04/12/2018  . Complete foot exam   04/11/2018  . Mammogram  08/27/2018  . Eye exam for diabetics  09/12/2018  . Tetanus Vaccine  05/18/2019  . Colon Cancer Screening  07/11/2019  . Flu Shot  Completed  . DEXA scan (bone density measurement)  Completed  .  Hepatitis C: One time screening is recommended by Center for Disease Control  (CDC) for  adults born from [birth year] through 1966.   Completed  . Pneumonia vaccines  Completed   Preventive Care for Adults  A healthy lifestyle and preventive care can promote health and wellness. Preventive health guidelines for adults include the following key practices.  . A routine yearly physical is a good way to check with your health care provider about your health and preventive screening. It is a chance to share any concerns and updates on your health and to receive a thorough exam.  . Visit your dentist for a routine exam and preventive care every 6 months. Brush your teeth twice a day and floss once a day. Good oral hygiene prevents tooth decay and gum disease.  . The frequency of eye exams is based on your age, health, family medical history, use  of contact lenses, and other factors. Follow your health care provider's recommendations for frequency of eye exams.  . Eat a healthy diet. Foods like vegetables, fruits, whole grains, low-fat dairy products, and lean protein foods contain the nutrients you need without too many  calories. Decrease your intake of foods high in solid fats, added sugars, and salt. Eat the right amount of calories for you. Get information about a proper diet from your health care provider, if necessary.  . Regular physical exercise is one of the most important things you can do for your health. Most adults should get at least 150 minutes of moderate-intensity exercise (any activity that increases your heart rate and causes you to sweat) each week. In addition, most adults need muscle-strengthening exercises on 2 or more days a week.  Silver Sneakers may be a benefit available to you. To determine eligibility, you may visit the website: www.silversneakers.com or contact program at 785-521-0007 Mon-Fri between 8AM-8PM.   . Maintain a healthy weight. The body mass index (BMI) is a screening tool to identify possible weight problems. It provides an estimate of body fat based on height and weight. Your health care provider can find your BMI and can help you achieve or maintain a healthy weight.   For adults 20 years and older: ? A BMI below 18.5 is considered underweight. ? A BMI of 18.5 to 24.9 is normal. ? A BMI of 25 to 29.9 is considered overweight. ? A BMI of 30 and above is considered obese.   . Maintain normal blood lipids and cholesterol levels by exercising and minimizing your intake of saturated fat. Eat a balanced diet with  plenty of fruit and vegetables. Blood tests for lipids and cholesterol should begin at age 81 and be repeated every 5 years. If your lipid or cholesterol levels are high, you are over 50, or you are at high risk for heart disease, you may need your cholesterol levels checked more frequently. Ongoing high lipid and cholesterol levels should be treated with medicines if diet and exercise are not working.  . If you smoke, find out from your health care provider how to quit. If you do not use tobacco, please do not start.  . If you choose to drink alcohol, please do  not consume more than 2 drinks per day. One drink is considered to be 12 ounces (355 mL) of beer, 5 ounces (148 mL) of wine, or 1.5 ounces (44 mL) of liquor.  . If you are 68-39 years old, ask your health care provider if you should take aspirin to prevent strokes.  . Use sunscreen. Apply sunscreen liberally and repeatedly throughout the day. You should seek shade when your shadow is shorter than you. Protect yourself by wearing long sleeves, pants, a wide-brimmed hat, and sunglasses year round, whenever you are outdoors.  . Once a month, do a whole body skin exam, using a mirror to look at the skin on your back. Tell your health care provider of new moles, moles that have irregular borders, moles that are larger than a pencil eraser, or moles that have changed in shape or color.

## 2017-10-13 NOTE — Progress Notes (Signed)
Subjective:   Katie [name] is a 72 y.o. female who presents for Medicare Annual (Subsequent) preventive examination.  Review of Systems:  N/A Cardiac Risk Factors include: advanced age (>39men, >78 women);obesity (BMI >30kg/m2);diabetes mellitus;dyslipidemia;hypertension     Objective:     Vitals: BP 124/70 (BP Location: Right Arm, Patient Position: Sitting, Cuff Size: Normal)   Pulse 84   Temp 97.8 F (36.6 C) (Oral)   Ht 5\' 2"  (1.575 m) Comment: no shoes  Wt 165 lb (74.8 kg)   LMP 08/26/1992 (Approximate)   SpO2 97%   BMI 30.18 kg/m   Body mass index is 30.18 kg/m.  Advanced Directives 10/13/2017 12/05/2016 10/08/2016 07/08/2016 07/01/2016  Does Patient Have a Medical Advance Directive? Yes Yes Yes Yes Yes  Type of Paramedic of [place];Living will [place];Living will [place];Living will [place];Living will -  Does patient want to make changes to medical advance directive? - - - No - Patient declined -  Copy of [place] in Chart? No - copy requested No - copy requested No - copy requested No - copy requested No - copy requested    Tobacco Social History   Tobacco Use  Smoking Status Never Smoker  Smokeless Tobacco Never Used     Counseling given: No   Clinical Intake:  Pre-visit preparation completed: Yes  Pain : No/denies pain Pain Score: 0-No pain     Nutritional Status: BMI > 30  Obese Nutritional Risks: None Diabetes: Yes CBG done?: No Did pt. bring in CBG monitor from home?: No  How often do you need to have someone help you when you read instructions, pamphlets, or other written materials from your doctor or pharmacy?: 1 - Never What is the last grade level you completed in school?: Masters degrees (2)   Interpreter Needed?: No  Comments: pt lives with spouse Information entered by :: LPinson, LPN  Past Medical History:  Diagnosis Date    . Allergy   . Anemia   . Blood transfusion without reported diagnosis   . Diverticulitis   . DJD (degenerative joint disease)   . DM type 2 (diabetes mellitus, type 2) ([place])   . Fibrocystic breast disease   . GERD (gastroesophageal reflux disease)   . History of chicken pox   . Hypercholesteremia   . Hypertension   . Lichen planopilaris   . Urinary incontinence    Past Surgical History:  Procedure Laterality Date  . BREAST SURGERY  02/1975   Left Breast Biopsy-Fibrocystic Disease  . BREAST SURGERY  10/1981   Bialteral Capsulectomy Breast Surgery  . BREAST SURGERY  07/1998   Replace Bilateral Silicone Implants  . BREAST SURGERY  01/1976   Bilater breast surgery to remove fibrocystic tissue and replace with silicone implants  . CHOLECYSTECTOMY N/A 07/08/2016   Procedure: LAPAROSCOPIC CHOLECYSTECTOMY;  Surgeon: [name], MD;  Location: [facility] ORS;  Service: General;  Laterality: N/A;  . DILATION AND CURETTAGE OF UTERUS  01/1994  . ESOPHAGOGASTRODUODENOSCOPY (EGD) WITH PROPOFOL N/A 12/05/2016   Procedure: ESOPHAGOGASTRODUODENOSCOPY (EGD) WITH PROPOFOL;  Surgeon: Jonathon Bellows, MD;  Location: [facility] ENDOSCOPY;  Service: Endoscopy;  Laterality: N/A;  . FINGER SURGERY  2001 & 2003   Trigger Finger  . FOOT SURGERY  1980   To Relieve Pinched Nerve  . FOOT SURGERY  07/2008   Left Foot-Plantar Fasciitis  . JOINT REPLACEMENT  04/19/2013   Hip Replacement-Right  . PLACEMENT OF BREAST  IMPLANTS  12/26/2004   Replace Failed Implants  . RHINOPLASTY  03/1964   To Correct Deviated Septum   Family History  Problem Relation Age of Onset  . Arthritis Mother   . Hypertension Mother   . Heart disease Father   . Diabetes Father   . Breast cancer Sister   . Hypertension Brother   . Leukemia Brother   . Coronary artery disease Brother    Social History   Socioeconomic History  . Marital status: Married    Spouse name: None  . Number of children: 1  . Years of education: None  . Highest  education level: None  Social Needs  . Financial resource strain: None  . Food insecurity - worry: None  . Food insecurity - inability: None  . Transportation needs - medical: None  . Transportation needs - non-medical: None  Occupational History  . Occupation: Retired  Tobacco Use  . Smoking status: Never Smoker  . Smokeless tobacco: Never Used  Substance and Sexual Activity  . Alcohol use: No    Alcohol/week: 0.0 oz  . Drug use: No  . Sexual activity: No    Partners: Male  Other Topics Concern  . None  Social History Narrative   Married for 37 years.    She has one child and one stepston.       Outpatient Encounter Medications as of 10/13/2017  Medication Sig  . atorvastatin (LIPITOR) 10 MG tablet take 1 tablet by mouth once daily  . Azelastine HCl 137 MCG/SPRAY SOLN instill 1 spray into each nostril twice a day for 1 week then if needed  . Calcium-Vitamin D-Vitamin K (VIACTIV) 160-109-32 MG-UNT-MCG CHEW Chew 1 each by mouth daily.  . cholecalciferol (VITAMIN D) 400 units TABS tablet Take 400 Units by mouth 2 (two) times daily.  . clobetasol (TEMOVATE) 0.05 % external solution Apply 1 application topically. Reported on 08/16/2015  . fluticasone (FLONASE) 50 MCG/ACT nasal spray instill 2 sprays into each nostril once daily  . glucose blood (FREESTYLE LITE) test strip Use as instructed  . hydrochlorothiazide (HYDRODIURIL) 25 MG tablet take 1 tablet by mouth once daily  . loratadine (CLARITIN) 10 MG tablet Take 10 mg by mouth daily.  [name] losartan (COZAAR) 100 MG tablet take 1 tablet by mouth once daily  . metFORMIN (GLUCOPHAGE) 500 MG tablet take 1 tablet by mouth once daily  . metroNIDAZOLE (METROCREAM) 0.75 % cream Apply 1 application topically 2 (two) times daily as needed.  . [DISCONTINUED] valACYclovir (VALTREX) 500 MG tablet For initial flare 2 tab twice daily x 7 days. For future flares can treat with 1 tab twice daily x 3 days   No facility-administered encounter  medications on file as of 10/13/2017.     Activities of Daily Living In your present state of health, do you have any difficulty performing the following activities: 10/13/2017  Hearing? N  Vision? N  Difficulty concentrating or making decisions? N  Walking or climbing stairs? N  Dressing or bathing? N  Doing errands, shopping? N  Preparing Food and eating ? N  Using the Toilet? N  In the past six months, have you accidently leaked urine? N  Do you have problems with loss of bowel control? N  Managing your Medications? N  Managing your Finances? N  Housekeeping or managing your Housekeeping? N  Some recent data might be hidden    Patient Care Team: [name], MD as PCP - General (Family Medicine) Birder [name], MD  as Referring Physician (Ophthalmology) [name], MD as Consulting Physician (Dermatology) [name], MD as Consulting Physician (Orthopedic Surgery) [name], MD as Consulting Physician (General Surgery) [name], MD as Consulting Physician (Dentistry)    Assessment:   This is a routine wellness examination for [place].   Hearing Screening   125Hz  250Hz  500Hz  1000Hz  2000Hz  3000Hz  4000Hz  6000Hz  8000Hz   Right ear:   40 40 40  40    Left ear:   40 40 40  40    Vision Screening Comments: Last vision exam in Jan 2019 with Dr. George Ina  Exercise Activities and Dietary recommendations Current Exercise Habits: Structured exercise class, Type of exercise: stretching;strength training/weights;calisthenics, Time (Minutes): 60, Frequency (Times/Week): 3, Weekly Exercise (Minutes/Week): 180, Intensity: Moderate, Exercise limited by: None identified  Goals    . Increase physical activity     Starting 10/13/2017, I will continue to exercise for 60-90 minutes 3 days per week.        Fall Risk Fall Risk  10/13/2017 10/08/2016 10/06/2015 10/04/2014  Falls in the past year? No No No Yes  Number falls in past yr: - - - 1  Injury with Fall? - - -  No   Depression Screen PHQ 2/9 Scores 10/13/2017 10/08/2016 10/06/2015 10/04/2014  PHQ - 2 Score 0 0 0 0  PHQ- 9 Score 0 - - -     Cognitive Function MMSE - Mini Mental State Exam 10/13/2017 10/08/2016  Orientation to time 5 5  Orientation to Place 5 5  Registration 3 3  Attention/ Calculation 0 0  Recall 3 3  Language- name 2 objects 0 0  Language- repeat 1 1  Language- follow 3 step command 3 3  Language- read & follow direction 0 0  Write a sentence 0 0  Copy design 0 0  Total score 20 20     PLEASE NOTE: A Mini-Cog screen was completed. Maximum score is 20. A value of 0 denotes this part of [place] MMSE was not completed or the patient failed this part of the Mini-Cog screening.   Mini-Cog Screening Orientation to Time - Max 5 pts Orientation to Place - Max 5 pts Registration - Max 3 pts Recall - Max 3 pts Language Repeat - Max 1 pts Language Follow 3 Step Command - Max 3 pts     Immunization History  Administered Date(s) Administered  . Hep A / Hep B 05/29/2009, 07/03/2009, 11/24/2009  . Influenza, High Dose Seasonal PF 04/18/2014, 04/13/2015  . Influenza, Seasonal, Injecte, Preservative Fre 06/26/2016  . Influenza-Unspecified 05/17/2009, 04/02/2011, 05/20/2012, 06/02/2013, 07/01/2013, 04/18/2014, 04/13/2015, 06/27/2016, 04/11/2017  . Pneumococcal Conjugate-13 04/18/2014  . Pneumococcal Polysaccharide-23 10/06/2015  . Pneumococcal-Unspecified 04/18/2014  . Td 05/17/2009  . Typhoid Inactivated 05/17/2009  . Yellow Fever 05/17/2009  . Zoster 04/02/2011    Screening Tests Health Maintenance  Topic Date Due  . FOOT EXAM  04/11/2018  . HEMOGLOBIN A1C  04/12/2018  . MAMMOGRAM  08/27/2018  . OPHTHALMOLOGY EXAM  09/12/2018  . TETANUS/TDAP  05/18/2019  . COLONOSCOPY  07/11/2019  . INFLUENZA VACCINE  Completed  . DEXA SCAN  Completed  . Hepatitis C Screening  Completed  . PNA vac Low Risk Adult  Completed      Plan:     I have personally reviewed, addressed,  and noted the following in the patient's chart:  A. Medical and social history B. Use of alcohol, tobacco or illicit drugs  C. Current medications and supplements D. Functional ability and  status E.  Nutritional status F.  Physical activity G. Advance directives H. List of other physicians I.  Hospitalizations, surgeries, and ER visits in previous 12 months J.  [place] to include hearing, vision, cognitive, depression L. Referrals and appointments - none  In addition, I have reviewed and discussed with patient certain preventive protocols, quality metrics, and best practice recommendations. A written personalized care plan for preventive services as well as general preventive health recommendations were provided to patient.  See attached scanned questionnaire for additional information.   Signed,   Lindell Noe, MHA, BS, LPN Health Coach

## 2017-10-13 NOTE — Progress Notes (Signed)
I reviewed health advisor's note, was available for consultation, and agree with documentation and plan.   Signed,  Bird Swetz T. Bonham Zingale, MD  

## 2017-10-17 ENCOUNTER — Telehealth: Payer: Self-pay | Admitting: Internal Medicine

## 2017-10-17 ENCOUNTER — Other Ambulatory Visit: Payer: Self-pay

## 2017-10-17 ENCOUNTER — Encounter: Payer: Self-pay | Admitting: Family Medicine

## 2017-10-17 ENCOUNTER — Ambulatory Visit (INDEPENDENT_AMBULATORY_CARE_PROVIDER_SITE_OTHER): Payer: Medicare Other | Admitting: Family Medicine

## 2017-10-17 VITALS — BP 140/70 | HR 67 | Temp 97.8°F | Ht 62.0 in | Wt 165.5 lb

## 2017-10-17 DIAGNOSIS — E7800 Pure hypercholesterolemia, unspecified: Secondary | ICD-10-CM | POA: Diagnosis not present

## 2017-10-17 DIAGNOSIS — I10 Essential (primary) hypertension: Secondary | ICD-10-CM

## 2017-10-17 DIAGNOSIS — K5790 Diverticulosis of intestine, part unspecified, without perforation or abscess without bleeding: Secondary | ICD-10-CM | POA: Insufficient documentation

## 2017-10-17 DIAGNOSIS — E119 Type 2 diabetes mellitus without complications: Secondary | ICD-10-CM | POA: Diagnosis not present

## 2017-10-17 DIAGNOSIS — K5730 Diverticulosis of large intestine without perforation or abscess without bleeding: Secondary | ICD-10-CM

## 2017-10-17 DIAGNOSIS — E559 Vitamin D deficiency, unspecified: Secondary | ICD-10-CM

## 2017-10-17 LAB — HM DIABETES FOOT EXAM

## 2017-10-17 MED ORDER — LOSARTAN POTASSIUM 100 MG PO TABS: 100.0000 mg | ORAL_TABLET | Freq: Every day | ORAL | 3 refills | Status: DC

## 2017-10-17 MED ORDER — GLUCOSE BLOOD VI STRP: ORAL_STRIP | 12 refills | Status: DC

## 2017-10-17 MED ORDER — HYDROCHLOROTHIAZIDE 25 MG PO TABS: 25.0000 mg | ORAL_TABLET | Freq: Every day | ORAL | 3 refills | Status: DC

## 2017-10-17 MED ORDER — METFORMIN HCL 500 MG PO TABS: 500.0000 mg | ORAL_TABLET | Freq: Every day | ORAL | 3 refills | Status: DC

## 2017-10-17 MED ORDER — ATORVASTATIN CALCIUM 10 MG PO TABS: 10.0000 mg | ORAL_TABLET | Freq: Every day | ORAL | 3 refills | Status: DC

## 2017-10-17 NOTE — Patient Instructions (Signed)
Work on low carb ,low fat high fiber diet. Continue with exercise. Call GI for appt if interested in discussing their thoughts on you episodes of diverticulitis.   Dietary fiber is associated with a decreased risk of symptomatic diverticular disease. A diet high in total fat and red meat is associated with an increased risk of symptomatic diverticular disease. There is no association between nut, corn, and popcorn consumption and the risk of diverticulosis and diverticular bleeding. Vigorous physical activity appears to reduce the risk of diverticulitis and diverticular bleeding. Obesity and several medications (eg, nonsteroidal anti-inflammatory drugs, steroids, and opiates) are associated with an increased risk of diverticulitis and diverticular bleeding. Statins may be associated with a decreased risk of diverticular perforation.

## 2017-10-17 NOTE — Assessment & Plan Note (Signed)
Resolved

## 2017-10-17 NOTE — Assessment & Plan Note (Signed)
Well controlled. Continue current medication.  

## 2017-10-17 NOTE — Telephone Encounter (Signed)
Pt states she was recommended to see Dr.Pyrtle by her PCP and is needing to be seen for recurring episodes of diverticulitis. Records have been placed on Dr.Pyrtle's desk for review.

## 2017-10-17 NOTE — Progress Notes (Signed)
Subjective:    Patient ID: Katie [name], female    DOB: October 28, 1945, 72 y.o.   MRN: [id number]  HPI   The patient presents for  annual eval and review of chronic health problems. He/She also has the following acute concerns today: 2 weeks ago she had sudden onset of pain in LLQ. No emesis, had a lot of gas, no BMs. Seen at urgent care. Dx with diverticulitis... Has had 2 episodes in last  6 months.  Treated with Augmentin.  She wonders if she needs early colonoscopy.[name] No change in caliber of stool, no new constipation, no blood in stool.  After treatment with antibiotics.. No longer painful.  The patient saw Candis Musa, LPN for medicare wellness. Note reviewed in detail and important notes copied below.   10/17/17 today  Diabetes:  Good control on metformin but slightly higher given recent steroid injection in 08/2017  Lab Results  Component Value Date   HGBA1C 6.9 (H) 10/13/2017  Using medications without difficulties: Hypoglycemic episodes:none Hyperglycemic episodes: 300 after steroid shot. Feet problems: none Blood Sugars averaging: FBS 130 eye exam within last year:  Hypertension:   Good control on losartan, HCTZ . .. Has not taken med until just before coming to office today, at home well controlled. BP Readings from Last 3 Encounters:  10/17/17 140/70  10/13/17 124/70  07/11/17 110/60  Using medication without problems or lightheadedness:  none Chest pain with exertion: none Edema:none Short of breath: none Average home BPs: Other issues:  she has noted occ high heart rate to 160s when she is exercising. Goes down when she stops.. No associated symtpoms   Elevated Cholesterol:  Good control on atorvastatin 10 mg daily Lab Results  Component Value Date   CHOL 150 10/13/2017   HDL 34.40 (L) 10/13/2017   LDLCALC 79 04/01/2016   LDLDIRECT 84.0 10/13/2017   TRIG 208.0 (H) 10/13/2017   CHOLHDL 4 10/13/2017  Using medications without problems: none Muscle  aches: none Diet compliance: moderate Exercise: on treadmill walking,  Several times a week Other complaints:   Social History /Family History/Past Medical History reviewed in detail and updated in EMR if needed. Blood pressure 140/70, pulse 67, temperature 97.8 F (36.6 C), temperature source Oral, height 5\' 2"  (1.575 m), weight 165 lb 8 oz (75.1 kg), last menstrual period 08/26/1992.   Review of Systems  Constitutional: Negative for fatigue and fever.  HENT: Negative for congestion.   Eyes: Negative for pain.  Respiratory: Negative for cough and shortness of breath.   Cardiovascular: Negative for chest pain, palpitations and leg swelling.  Gastrointestinal: Negative for abdominal pain.  Genitourinary: Negative for dysuria and vaginal bleeding.  Musculoskeletal: Negative for back pain.       Left knee pain treated  By ortho  Neurological: Negative for syncope, light-headedness and headaches.  Psychiatric/Behavioral: Negative for dysphoric mood.       Objective:   Physical Exam  Constitutional: Vital signs are normal. She appears well-developed and well-nourished. She is cooperative.  Non-toxic appearance. She does not appear ill. No distress.  obesity  HENT:  Head: Normocephalic.  Right Ear: Hearing, tympanic membrane, external ear and ear canal normal.  Left Ear: Hearing, tympanic membrane, external ear and ear canal normal.  Nose: Nose normal.  Eyes: Conjunctivae, EOM and lids are normal. Pupils are equal, round, and reactive to light. Lids are everted and swept, no foreign bodies found.  Neck: Trachea normal and normal range of motion. Neck supple. Carotid bruit is  not present. No thyroid mass and no thyromegaly present.  Cardiovascular: Normal rate, regular rhythm, S1 normal, S2 normal, normal heart sounds and intact distal pulses. Exam reveals no gallop.  No murmur heard. Pulmonary/Chest: Effort normal and breath sounds normal. No respiratory distress. She has no wheezes.  She has no rhonchi. She has no rales.  Abdominal: Soft. Normal appearance and bowel sounds are normal. She exhibits no distension, no fluid wave, no abdominal bruit and no mass. There is no hepatosplenomegaly. There is no tenderness. There is no rebound, no guarding and no CVA tenderness. No hernia.  Lymphadenopathy:    She has no cervical adenopathy.    She has no axillary adenopathy.  Neurological: She is alert. She has normal strength. No cranial nerve deficit or sensory deficit.  Skin: Skin is warm, dry and intact. No rash noted.  Psychiatric: Her speech is normal and behavior is normal. Judgment normal. Her mood appears not anxious. Cognition and memory are normal. She does not exhibit a depressed mood.         Diabetic foot exam: Normal inspection No skin breakdown No calluses  Normal DP pulses Normal sensation to light touch and monofilament Nails normal  Assessment & Plan:  The patient's preventative maintenance and recommended screening tests for an annual wellness exam were reviewed in full today. Brought up to date unless services declined.  Counselled on the importance of diet, exercise, and its role in overall health and mortality. The patient's FH and SH was reviewed, including their home life, tobacco status, and drug and alcohol status.   Last PAP 09/2014 nml, neg HPV, no further indicated. DVE:  no family history of ovarian or uterine cancer She has had mammogram ( nml) 08/2016 DEXA (mild osteopenia, stable 07/2016) plans repeat in 2 years Vaccines: uptodate Colon: 06/2009 Pearletha Furl , repeat in 10 years. Hep C: neg Nonsmoker

## 2017-10-31 NOTE — Telephone Encounter (Signed)
Dr. Hilarie Fredrickson reviewed records and okay to schedule colon.  Called and LM on vmail to call back to schedule

## 2017-11-03 ENCOUNTER — Encounter: Payer: Self-pay | Admitting: Gastroenterology

## 2017-11-03 ENCOUNTER — Ambulatory Visit (INDEPENDENT_AMBULATORY_CARE_PROVIDER_SITE_OTHER): Payer: Medicare Other | Admitting: Gastroenterology

## 2017-11-03 VITALS — BP 145/86 | HR 67 | Temp 97.5°F | Ht 62.0 in | Wt 166.8 lb

## 2017-11-03 DIAGNOSIS — K5732 Diverticulitis of large intestine without perforation or abscess without bleeding: Secondary | ICD-10-CM | POA: Diagnosis not present

## 2017-11-03 NOTE — Progress Notes (Signed)
Jonathon Bellows MD, MRCP(U.K) [address]  [place]  [place], [place] [address]  Main: [phone]  Fax: [phone]   Primary Care Physician: [name], MD  Primary Gastroenterologist:  Dr. Jonathon Bellows   Chief Complaint  Patient presents with  . Diverticulitis    HPI: Katie [name] is a 72 y.o. female  She is here today to follow up to her initial visit  07/18 when she was seen for Dyspepsia.   Summary of history :  She has seen surgery for her RUQ pain , underwent a cholecystectomy in 06/2017 . Despite which continues to have the pain.RUQ post prandial , few times a week, bloating , ,localizedSince it was persistent Dr Adonis Huguenin referred  GI to evaluate for peptic ulcers. RUQ USG 10/2016 was normal with only post operative changes. LFT's normal 10/2016 . EGD on 12/05/16 which showed antral gastritis with negative for H pylori which was confirmed on biopsy.   Interval history 03/19/17 -11/03/16  Today she is here for follow up . No RUQ discomfort since last visit. On a low FODMAP diet which helped. Uses IB guard as needed.   She has been having some LLQ pain on and off for a few months. In September she had an episode where she was seen at [facility] and was diagnosed with diverticulitis, treated with antibiotics. She was advised to have a colonoscopy. Denies any rectal bleeding, no abdominal pain, not on any blood thinners, no weight loss.    Current Outpatient Medications  Medication Sig Dispense Refill  . atorvastatin (LIPITOR) 10 MG tablet Take 1 tablet (10 mg total) by mouth daily. 90 tablet 3  . Azelastine HCl 137 MCG/SPRAY SOLN instill 1 spray into each nostril twice a day for 1 week then if needed 30 mL 5  . Calcium-Vitamin D-Vitamin K (VIACTIV) 588-502-77 MG-UNT-MCG CHEW Chew 1 each by mouth daily.    . cholecalciferol (VITAMIN D) 400 units TABS tablet Take 400 Units by mouth 2 (two) times daily.    . clobetasol (TEMOVATE) 0.05 % external  solution Apply 1 application topically. Reported on 08/16/2015    . fluticasone (FLONASE) 50 MCG/ACT nasal spray instill 2 sprays into each nostril once daily 16 g 5  . glucose blood (FREESTYLE LITE) test strip Use to check blood sugar daily.  Dx: E11.9 50 each 12  . hydrochlorothiazide (HYDRODIURIL) 25 MG tablet Take 1 tablet (25 mg total) by mouth daily. 90 tablet 3  . loratadine (CLARITIN) 10 MG tablet Take 10 mg by mouth daily.    [name] losartan (COZAAR) 100 MG tablet Take 1 tablet (100 mg total) by mouth daily. 90 tablet 3  . metFORMIN (GLUCOPHAGE) 500 MG tablet Take 1 tablet (500 mg total) by mouth daily. 90 tablet 3  . metroNIDAZOLE (METROCREAM) 0.75 % cream Apply 1 application topically 2 (two) times daily as needed.     No current facility-administered medications for this visit.     Allergies as of 11/03/2017 - Review Complete 11/03/2017  Allergen Reaction Noted  . Erythromycin Anaphylaxis 06/26/2009  . Ciprofloxacin Rash 06/26/2009  . Daypro [oxaprozin]  10/13/2012  . Enalapril maleate Nausea Only 10/06/2015  . Nsaids Nausea Only 10/13/2012  . Vioxx [rofecoxib]  10/13/2012    ROS:  General: Negative for anorexia, weight loss, fever, chills, fatigue, weakness. ENT: Negative for hoarseness, difficulty swallowing , nasal congestion. CV: Negative for chest pain, angina, palpitations, dyspnea on exertion, peripheral edema.  Respiratory: Negative for dyspnea at  rest, dyspnea on exertion, cough, sputum, wheezing.  GI: See history of present illness. GU:  Negative for dysuria, hematuria, urinary incontinence, urinary frequency, nocturnal urination.  Endo: Negative for unusual weight change.    Physical Examination:   BP (!) 145/86 (BP Location: Left Arm, Patient Position: Sitting, Cuff Size: Normal)   Pulse 67   Temp (!) 97.5 F (36.4 C) (Oral)   Ht 5\' 2"  (1.575 m)   Wt 166 lb 12.8 oz (75.7 kg)   LMP 08/26/1992 (Approximate)   BMI 30.51 kg/m   General: Well-nourished,  well-developed in no acute distress.  Eyes: No icterus. Conjunctivae pink. Mouth: Oropharyngeal mucosa moist and pink , no lesions erythema or exudate. Lungs: Clear to auscultation bilaterally. Non-labored. Heart: Regular rate and rhythm, no murmurs rubs or gallops.  Abdomen: Bowel sounds are normal, nontender, nondistended, no hepatosplenomegaly or masses, no abdominal bruits or hernia , no rebound or guarding.   Extremities: No lower extremity edema. No clubbing or deformities. Neuro: Alert and oriented x 3.  Grossly intact. Skin: Warm and dry, no jaundice.   Psych: Alert and cooperative, normal mood and affect.   Imaging Studies: No results found.  Assessment and Plan:   Katie [name] is a 72 y.o. y/o female   here today to see me after two recent episodes of diverticulitis. Last colonoscopy was 9 years back. Will proceed with colonosocpy to r/o lesions precipitating diverticulitis.   Plan   1. Colonoscopy    I have discussed alternative options, risks & benefits,  which include, but are not limited to, bleeding, infection, perforation,respiratory complication & drug reaction.  The patient agrees with this plan & written consent will be obtained.      Dr Jonathon Bellows  MD,MRCP [facility]) Follow up PRN

## 2017-11-07 ENCOUNTER — Encounter: Payer: Self-pay | Admitting: Student

## 2017-11-07 ENCOUNTER — Other Ambulatory Visit: Payer: Self-pay

## 2017-11-07 ENCOUNTER — Ambulatory Visit: Payer: Medicare Other | Admitting: Anesthesiology

## 2017-11-07 ENCOUNTER — Encounter
Admission: RE | Disposition: A | Discharge status: Home / Self Care | Payer: Self-pay | Source: Ambulatory Visit | Attending: Gastroenterology

## 2017-11-07 ENCOUNTER — Ambulatory Visit
Admission: RE | Admit: 2017-11-07 | Discharge: 2017-11-07 | Disposition: A | Discharge status: Home / Self Care | Payer: Medicare Other | Source: Ambulatory Visit | Attending: Gastroenterology | Admitting: Gastroenterology

## 2017-11-07 DIAGNOSIS — E7800 Pure hypercholesterolemia, unspecified: Secondary | ICD-10-CM | POA: Insufficient documentation

## 2017-11-07 DIAGNOSIS — D649 Anemia, unspecified: Secondary | ICD-10-CM | POA: Diagnosis not present

## 2017-11-07 DIAGNOSIS — D125 Benign neoplasm of sigmoid colon: Secondary | ICD-10-CM

## 2017-11-07 DIAGNOSIS — K635 Polyp of colon: Secondary | ICD-10-CM | POA: Diagnosis not present

## 2017-11-07 DIAGNOSIS — E119 Type 2 diabetes mellitus without complications: Secondary | ICD-10-CM | POA: Diagnosis not present

## 2017-11-07 DIAGNOSIS — K5730 Diverticulosis of large intestine without perforation or abscess without bleeding: Secondary | ICD-10-CM | POA: Insufficient documentation

## 2017-11-07 DIAGNOSIS — Z96641 Presence of right artificial hip joint: Secondary | ICD-10-CM | POA: Diagnosis not present

## 2017-11-07 DIAGNOSIS — Z7984 Long term (current) use of oral hypoglycemic drugs: Secondary | ICD-10-CM | POA: Diagnosis not present

## 2017-11-07 DIAGNOSIS — Z09 Encounter for follow-up examination after completed treatment for conditions other than malignant neoplasm: Secondary | ICD-10-CM | POA: Insufficient documentation

## 2017-11-07 DIAGNOSIS — Z79899 Other long term (current) drug therapy: Secondary | ICD-10-CM | POA: Insufficient documentation

## 2017-11-07 DIAGNOSIS — K5790 Diverticulosis of intestine, part unspecified, without perforation or abscess without bleeding: Secondary | ICD-10-CM | POA: Diagnosis not present

## 2017-11-07 DIAGNOSIS — Z7951 Long term (current) use of inhaled steroids: Secondary | ICD-10-CM | POA: Diagnosis not present

## 2017-11-07 DIAGNOSIS — K5732 Diverticulitis of large intestine without perforation or abscess without bleeding: Secondary | ICD-10-CM | POA: Diagnosis not present

## 2017-11-07 DIAGNOSIS — I10 Essential (primary) hypertension: Secondary | ICD-10-CM | POA: Insufficient documentation

## 2017-11-07 DIAGNOSIS — K5792 Diverticulitis of intestine, part unspecified, without perforation or abscess without bleeding: Secondary | ICD-10-CM | POA: Diagnosis not present

## 2017-11-07 DIAGNOSIS — D122 Benign neoplasm of ascending colon: Secondary | ICD-10-CM | POA: Insufficient documentation

## 2017-11-07 DIAGNOSIS — D136 Benign neoplasm of pancreas: Secondary | ICD-10-CM | POA: Diagnosis not present

## 2017-11-07 HISTORY — PX: COLONOSCOPY WITH PROPOFOL: SHX5780

## 2017-11-07 LAB — GLUCOSE, CAPILLARY: Glucose-Capillary: 132 mg/dL — ABNORMAL HIGH (ref 65–99)

## 2017-11-07 SURGERY — COLONOSCOPY WITH PROPOFOL
Anesthesia: General

## 2017-11-07 MED ORDER — LIDOCAINE HCL (PF) 2 % IJ SOLN
INTRAMUSCULAR | Status: DC | PRN
  Administered 2017-11-07: 100 mg via INTRADERMAL

## 2017-11-07 MED ORDER — LIDOCAINE HCL (PF) 2 % IJ SOLN
INTRAMUSCULAR | Status: AC
  Filled 2017-11-07: qty 10

## 2017-11-07 MED ORDER — PROPOFOL 10 MG/ML IV BOLUS
INTRAVENOUS | Status: DC | PRN
  Administered 2017-11-07: 20 mg via INTRAVENOUS
  Administered 2017-11-07: 80 mg via INTRAVENOUS

## 2017-11-07 MED ORDER — PROPOFOL 500 MG/50ML IV EMUL
INTRAVENOUS | Status: DC | PRN
  Administered 2017-11-07: 80 ug/kg/min via INTRAVENOUS

## 2017-11-07 MED ORDER — SODIUM CHLORIDE 0.9 % IV SOLN
INTRAVENOUS | Status: DC
  Administered 2017-11-07 (×2): via INTRAVENOUS

## 2017-11-07 MED ORDER — PROPOFOL 500 MG/50ML IV EMUL
INTRAVENOUS | Status: AC
  Filled 2017-11-07: qty 50

## 2017-11-07 NOTE — Anesthesia Post-op Follow-up Note (Signed)
Anesthesia QCDR form completed.        

## 2017-11-07 NOTE — Anesthesia Preprocedure Evaluation (Signed)
Anesthesia Evaluation  Patient identified by MRN, date of birth, ID band Patient awake    Reviewed: Allergy & Precautions, H&P , NPO status , reviewed documented beta blocker date and time   Airway Mallampati: II  TM Distance: >3 FB     Dental  (+) Chipped, Missing   Pulmonary    Pulmonary exam normal        Cardiovascular hypertension, Normal cardiovascular exam     Neuro/Psych    GI/Hepatic GERD  Controlled,  Endo/Other  diabetes  Renal/GU      Musculoskeletal   Abdominal   Peds  Hematology  (+) anemia ,   Anesthesia Other Findings   Reproductive/Obstetrics                             Anesthesia Physical Anesthesia Plan  ASA: III  Anesthesia Plan: General   Post-op Pain Management:    Induction:   PONV Risk Score and Plan: 3 and Propofol infusion  Airway Management Planned:   Additional Equipment:   Intra-op Plan:   Post-operative Plan:   Informed Consent: I have reviewed the patients History and Physical, chart, labs and discussed the procedure including the risks, benefits and alternatives for the proposed anesthesia with the patient or authorized representative who has indicated his/her understanding and acceptance.   Dental Advisory Given  Plan Discussed with:   Anesthesia Plan Comments:         Anesthesia Quick Evaluation

## 2017-11-07 NOTE — Op Note (Signed)
[facility] Gastroenterology Patient Name: Katie [name] Procedure Date: 11/07/2017 9:07 AM MRN: [id number] Account #: [id number] Date of Birth: 1946/02/02 Admit Type: Outpatient Age: 72 Room: [facility] ENDO ROOM 4 Gender: Female Note Status: Finalized Procedure:            Colonoscopy Indications:          Follow-up of diverticulitis Providers:            Jonathon Bellows MD, MD Referring MD:         [name] MD, MD (Referring MD) Medicines:            Monitored Anesthesia Care Complications:        No immediate complications. Procedure:            Pre-Anesthesia Assessment:                       - Prior to the procedure, a History and Physical was                        performed, and patient medications, allergies and                        sensitivities were reviewed. The patient's tolerance of                        previous anesthesia was reviewed.                       - The risks and benefits of the procedure and the                        sedation options and risks were discussed with the                        patient. All questions were answered and informed                        consent was obtained.                       - ASA Grade Assessment: II - A patient with mild                        systemic disease.                       After obtaining informed consent, the colonoscope was                        passed under direct vision. Throughout the procedure,                        the patient's blood pressure, pulse, and oxygen                        saturations were monitored continuously. The                        Colonoscope was introduced through the anus and  advanced to the the cecum, identified by the                        appendiceal orifice, IC valve and transillumination.                        The colonoscopy was performed with ease. The patient                        tolerated the procedure well. The quality of the  bowel                        preparation was good. Findings:      Multiple small-mouthed diverticula were found in the sigmoid colon.      Two sessile polyps were found in the ascending colon. The polyps were 4       to 6 mm in size. These polyps were removed with a cold snare. Resection       and retrieval were complete.      A 5 mm polyp was found in the sigmoid colon. The polyp was sessile. The       polyp was removed with a cold snare. Resection and retrieval were       complete.      The exam was otherwise without abnormality on direct and retroflexion       views. Impression:           - Diverticulosis in the sigmoid colon.                       - Two 4 to 6 mm polyps in the ascending colon, removed                        with a cold snare. Resected and retrieved.                       - One 5 mm polyp in the sigmoid colon, removed with a                        cold snare. Resected and retrieved.                       - The examination was otherwise normal on direct and                        retroflexion views. Recommendation:       - Discharge patient to home (with escort).                       - Resume previous diet.                       - Continue present medications.                       - Await pathology results.                       - Repeat colonoscopy in 3 - 5 years for surveillance  based on pathology results. Procedure Code(s):    --- Professional ---                       [phone], Colonoscopy, flexible; with removal of tumor(s),                        polyp(s), or other lesion(s) by snare technique Diagnosis Code(s):    --- Professional ---                       D12.2, Benign neoplasm of ascending colon                       D12.5, Benign neoplasm of sigmoid colon                       K57.32, Diverticulitis of large intestine without                        perforation or abscess without bleeding                       K57.30, Diverticulosis  of large intestine without                        perforation or abscess without bleeding CPT copyright 2016 American Medical Association. All rights reserved. The codes documented in this report are preliminary and upon coder review may  be revised to meet current compliance requirements. Jonathon Bellows, MD Jonathon Bellows MD, MD 11/07/2017 9:34:47 AM This report has been signed electronically. Number of Addenda: 0 Note Initiated On: 11/07/2017 9:07 AM Scope Withdrawal Time: 0 hours 14 minutes 33 seconds  Total Procedure Duration: 0 hours 18 minutes 37 seconds       [facility]

## 2017-11-07 NOTE — H&P (Signed)
Jonathon Bellows, MD [address], [place], [place], [place], [address] [address], [place], [place], [place], [address] Phone: [phone]  Fax: [phone]  Primary Care Physician:  [name], MD   Pre-Procedure History & Physical: HPI:  Katie [name] is a 72 y.o. female is here for an colonoscopy.   Past Medical History:  Diagnosis Date  . Allergy   . Anemia   . Blood transfusion without reported diagnosis   . Diverticulitis   . DJD (degenerative joint disease)   . DM type 2 (diabetes mellitus, type 2) ([place])   . Fibrocystic breast disease   . GERD (gastroesophageal reflux disease)   . History of chicken pox   . Hypercholesteremia   . Hypertension   . Lichen planopilaris   . Urinary incontinence     Past Surgical History:  Procedure Laterality Date  . BREAST SURGERY  02/1975   Left Breast Biopsy-Fibrocystic Disease  . BREAST SURGERY  10/1981   Bialteral Capsulectomy Breast Surgery  . BREAST SURGERY  07/1998   Replace Bilateral Silicone Implants  . BREAST SURGERY  01/1976   Bilater breast surgery to remove fibrocystic tissue and replace with silicone implants  . CHOLECYSTECTOMY N/A 07/08/2016   Procedure: LAPAROSCOPIC CHOLECYSTECTOMY;  Surgeon: [name], MD;  Location: [facility] ORS;  Service: General;  Laterality: N/A;  . DILATION AND CURETTAGE OF UTERUS  01/1994  . ESOPHAGOGASTRODUODENOSCOPY (EGD) WITH PROPOFOL N/A 12/05/2016   Procedure: ESOPHAGOGASTRODUODENOSCOPY (EGD) WITH PROPOFOL;  Surgeon: Jonathon Bellows, MD;  Location: [facility] ENDOSCOPY;  Service: Endoscopy;  Laterality: N/A;  . FINGER SURGERY  2001 & 2003   Trigger Finger  . FOOT SURGERY  1980   To Relieve Pinched Nerve  . FOOT SURGERY  07/2008   Left Foot-Plantar Fasciitis  . JOINT REPLACEMENT  04/19/2013   Hip Replacement-Right  . PLACEMENT OF BREAST IMPLANTS  12/26/2004   Replace Failed Implants  . RHINOPLASTY  03/1964   To Correct Deviated Septum    Prior to Admission medications     Medication Sig Start Date End Date Taking? Authorizing Provider  Azelastine HCl 137 MCG/SPRAY SOLN instill 1 spray into each nostril twice a day for 1 week then if needed 09/05/17  Yes Bedsole, Amy E, MD  cholecalciferol (VITAMIN D) 400 units TABS tablet Take 400 Units by mouth 2 (two) times daily.   Yes [provider]  clobetasol (TEMOVATE) 0.05 % external solution Apply 1 application topically. Reported on 08/16/2015 06/02/14  Yes [provider]  fluticasone (FLONASE) 50 MCG/ACT nasal spray instill 2 sprays into each nostril once daily 09/05/17  Yes Bedsole, Amy E, MD  glucose blood (FREESTYLE LITE) test strip Use to check blood sugar daily.  Dx: E11.9 10/17/17  Yes Bedsole, Amy E, MD  hydrochlorothiazide (HYDRODIURIL) 25 MG tablet Take 1 tablet (25 mg total) by mouth daily. 10/17/17  Yes Bedsole, Amy E, MD  loratadine (CLARITIN) 10 MG tablet Take 10 mg by mouth daily.   Yes [provider]  losartan (COZAAR) 100 MG tablet Take 1 tablet (100 mg total) by mouth daily. 10/17/17  Yes Bedsole, Amy E, MD  atorvastatin (LIPITOR) 10 MG tablet Take 1 tablet (10 mg total) by mouth daily. 10/17/17   [name], MD  Calcium-Vitamin D-Vitamin K (VIACTIV) 476-546-50 MG-UNT-MCG CHEW Chew 1 each by mouth daily.    [provider]  metFORMIN (GLUCOPHAGE) 500 MG tablet Take 1 tablet (500 mg total) by mouth daily. 10/17/17   Bedsole,  Amy E, MD  metroNIDAZOLE (METROCREAM) 0.75 % cream Apply 1 application topically 2 (two) times daily as needed.    [provider]    Allergies as of 11/03/2017 - Review Complete 11/03/2017  Allergen Reaction Noted  . Erythromycin Anaphylaxis 06/26/2009  . Ciprofloxacin Rash 06/26/2009  . Daypro [oxaprozin]  10/13/2012  . Enalapril maleate Nausea Only 10/06/2015  . Nsaids Nausea Only 10/13/2012  . Vioxx [rofecoxib]  10/13/2012    Family History  Problem Relation Age of Onset  . Arthritis Mother   . Hypertension Mother   . Heart  disease Father   . Diabetes Father   . Breast cancer Sister   . Hypertension Brother   . Leukemia Brother   . Coronary artery disease Brother     Social History   Socioeconomic History  . Marital status: Married    Spouse name: Not on file  . Number of children: 1  . Years of education: Not on file  . Highest education level: Not on file  Social Needs  . Financial resource strain: Not on file  . Food insecurity - worry: Not on file  . Food insecurity - inability: Not on file  . Transportation needs - medical: Not on file  . Transportation needs - non-medical: Not on file  Occupational History  . Occupation: Retired  Tobacco Use  . Smoking status: Never Smoker  . Smokeless tobacco: Never Used  Substance and Sexual Activity  . Alcohol use: No    Alcohol/week: 0.0 oz  . Drug use: No  . Sexual activity: No    Partners: Male  Other Topics Concern  . Not on file  Social History Narrative   Married for 37 years.    She has one child and one stepston.       Review of Systems: See HPI, otherwise negative ROS  Physical Exam: BP (!) 157/70   Pulse 86   Temp (!) 97 F (36.1 C) (Tympanic)   Resp 16   Ht 5\' 2"  (1.575 m)   Wt 163 lb (73.9 kg)   LMP 08/26/1992 (Approximate)   SpO2 97%   BMI 29.81 kg/m  General:   Alert,  pleasant and cooperative in NAD Head:  Normocephalic and atraumatic. Neck:  Supple; no masses or thyromegaly. Lungs:  Clear throughout to auscultation, normal respiratory effort.    Heart:  +S1, +S2, Regular rate and rhythm, No edema. Abdomen:  Soft, nontender and nondistended. Normal bowel sounds, without guarding, and without rebound.   Neurologic:  Alert and  oriented x4;  grossly normal neurologically.  Impression/Plan: Katie [name] is here for an colonoscopy to be performed for diverticulitis   Risks, benefits, limitations, and alternatives regarding  colonoscopy have been reviewed with the patient.  Questions have been answered.  All parties  agreeable.   Jonathon Bellows, MD  11/07/2017, 8:48 AM

## 2017-11-07 NOTE — Anesthesia Postprocedure Evaluation (Signed)
Anesthesia Post Note  Patient: BECCI BATTY  Procedure(s) Performed: COLONOSCOPY WITH PROPOFOL (N/A )  Patient location during evaluation: Endoscopy Anesthesia Type: General Level of consciousness: awake and alert Pain management: pain level controlled Vital Signs Assessment: post-procedure vital signs reviewed and stable Respiratory status: spontaneous breathing, nonlabored ventilation and respiratory function stable Cardiovascular status: blood pressure returned to baseline and stable Postop Assessment: no apparent nausea or vomiting Anesthetic complications: no     Last Vitals:  Vitals:   11/07/17 0934 11/07/17 0955  BP: 120/87 116/64  Pulse:    Resp: 14   Temp: (!) 36.1 C   SpO2:      Last Pain:  Vitals:   11/07/17 0934  TempSrc: Tympanic                 Mikaeel Petrow Harvie Heck

## 2017-11-07 NOTE — Transfer of Care (Signed)
Immediate Anesthesia Transfer of Care Note  Patient: Katie [name]  Procedure(s) Performed: COLONOSCOPY WITH PROPOFOL (N/A )  Patient Location: PACU and Endoscopy Unit  Anesthesia Type:General  Level of Consciousness: awake  Airway & Oxygen Therapy: Patient Spontanous Breathing  Post-op Assessment: Report given to RN  Post vital signs: stable  Last Vitals:  Vitals:   11/07/17 0845 11/07/17 0934  BP: (!) 157/70 (P) 120/87  Pulse: 86   Resp: 16 (P) 14  Temp: (!) 36.1 C (!) (P) 36.1 C  SpO2: 97%     Last Pain:  Vitals:   11/07/17 0934  TempSrc: (P) Tympanic         Complications: No apparent anesthesia complications

## 2017-11-10 ENCOUNTER — Encounter: Payer: Self-pay | Admitting: Gastroenterology

## 2017-11-10 LAB — SURGICAL PATHOLOGY

## 2017-11-11 ENCOUNTER — Other Ambulatory Visit: Payer: Self-pay

## 2017-11-11 ENCOUNTER — Ambulatory Visit: Payer: Medicare Other | Admitting: Gastroenterology

## 2017-11-11 DIAGNOSIS — M65322 Trigger finger, left index finger: Secondary | ICD-10-CM | POA: Diagnosis not present

## 2017-11-11 DIAGNOSIS — E669 Obesity, unspecified: Secondary | ICD-10-CM | POA: Diagnosis not present

## 2017-11-11 DIAGNOSIS — E119 Type 2 diabetes mellitus without complications: Secondary | ICD-10-CM | POA: Diagnosis not present

## 2017-11-11 DIAGNOSIS — K5732 Diverticulitis of large intestine without perforation or abscess without bleeding: Secondary | ICD-10-CM

## 2017-11-11 DIAGNOSIS — M1712 Unilateral primary osteoarthritis, left knee: Secondary | ICD-10-CM | POA: Diagnosis not present

## 2017-11-11 MED ORDER — AMOXICILLIN-POT CLAVULANATE 875-125 MG PO TABS: 1.0000 | ORAL_TABLET | Freq: Two times a day (BID) | ORAL | 0 refills | Status: AC

## 2017-11-11 NOTE — Telephone Encounter (Signed)
Patient called requesting a travel Rx of antibiotics for diverticulitis.   Per Dr. Vicente Males sent Rx for Augmentin to pharmacy.   Patient has been advised.

## 2017-11-16 ENCOUNTER — Encounter: Payer: Self-pay | Admitting: Gastroenterology

## 2017-11-25 NOTE — Telephone Encounter (Signed)
Patient never returned call to schedule and was then seen back at [place] on 3.11.19. Records will be shredded.

## 2018-01-01 DIAGNOSIS — L661 Lichen planopilaris: Secondary | ICD-10-CM | POA: Diagnosis not present

## 2018-01-13 ENCOUNTER — Other Ambulatory Visit: Payer: Self-pay | Admitting: Unknown Physician Specialty

## 2018-01-13 DIAGNOSIS — E669 Obesity, unspecified: Secondary | ICD-10-CM | POA: Diagnosis not present

## 2018-01-13 DIAGNOSIS — M1712 Unilateral primary osteoarthritis, left knee: Secondary | ICD-10-CM | POA: Diagnosis not present

## 2018-01-13 DIAGNOSIS — E119 Type 2 diabetes mellitus without complications: Secondary | ICD-10-CM | POA: Diagnosis not present

## 2018-01-23 ENCOUNTER — Telehealth (HOSPITAL_COMMUNITY): Payer: Self-pay | Admitting: Unknown Physician Specialty

## 2018-01-24 ENCOUNTER — Ambulatory Visit: Admission: RE | Admit: 2018-01-24 | Payer: Medicare Other | Source: Ambulatory Visit

## 2018-01-24 ENCOUNTER — Ambulatory Visit
Admission: RE | Admit: 2018-01-24 | Discharge: 2018-01-24 | Disposition: A | Discharge status: Home / Self Care | Payer: Medicare Other | Source: Ambulatory Visit | Attending: Unknown Physician Specialty | Admitting: Unknown Physician Specialty

## 2018-01-24 DIAGNOSIS — X58XXXA Exposure to other specified factors, initial encounter: Secondary | ICD-10-CM | POA: Diagnosis not present

## 2018-01-24 DIAGNOSIS — M1712 Unilateral primary osteoarthritis, left knee: Secondary | ICD-10-CM | POA: Insufficient documentation

## 2018-01-24 DIAGNOSIS — S83242A Other tear of medial meniscus, current injury, left knee, initial encounter: Secondary | ICD-10-CM | POA: Diagnosis not present

## 2018-01-24 DIAGNOSIS — M25562 Pain in left knee: Secondary | ICD-10-CM | POA: Diagnosis not present

## 2018-02-05 DIAGNOSIS — E669 Obesity, unspecified: Secondary | ICD-10-CM | POA: Diagnosis not present

## 2018-02-05 DIAGNOSIS — E119 Type 2 diabetes mellitus without complications: Secondary | ICD-10-CM | POA: Diagnosis not present

## 2018-02-05 DIAGNOSIS — M1712 Unilateral primary osteoarthritis, left knee: Secondary | ICD-10-CM | POA: Diagnosis not present

## 2018-02-09 ENCOUNTER — Encounter: Payer: Self-pay | Admitting: Family Medicine

## 2018-02-10 ENCOUNTER — Telehealth: Payer: Self-pay | Admitting: Family Medicine

## 2018-02-10 NOTE — Telephone Encounter (Signed)
I will see her if she hasn't been scheduled yet.

## 2018-02-10 NOTE — Telephone Encounter (Signed)
I can see her, just not this week as I am out! Scheduled her  June 25, 27 or 28th.. Okay to put in 2 15 min slots.  Okay to work in. ° Only if she cannot do one of these 3 days... Then make appt with other provider. °

## 2018-02-10 NOTE — Telephone Encounter (Signed)
See below CRM Can pt be worked in with one of you all Thanks     Copied from [place] [id number]. Topic: General - Other >> Feb 10, 2018  9:24 AM [name] wrote: Relation to pt: self  Call back number:  [phone] / [phone]   Reason for call:  As per patient My Chart message, patient would like to proceed to schedule surgical clearance appointment. Dr. Diona Browner approved patient seeing another physician within the practice due to Dr. Diona Browner availability, patient would like appointment in the month of June seeking to have orthopedic surgery in July, please advise

## 2018-02-10 NOTE — Telephone Encounter (Signed)
Patient is calling back and states her surgery is July 12th. She is wanting Dr. Diona Browner to fit her in and be seen before that. Please advise.

## 2018-02-10 NOTE — Telephone Encounter (Signed)
Left message asking pt to call office  °

## 2018-02-11 NOTE — Telephone Encounter (Signed)
Dr. Diona Browner could do 02/17/2018 at 12:00 pm.  Please try to move.

## 2018-02-11 NOTE — Telephone Encounter (Signed)
Left message asking pt to call office  °

## 2018-02-11 NOTE — Telephone Encounter (Signed)
Pt aware of time date and time change

## 2018-02-11 NOTE — Telephone Encounter (Signed)
I looked at dr Diona Browner schedule there is no 2-15 min appointments that can be combined to make 30 min appointment pt has appointment with debbie 6/26

## 2018-02-11 NOTE — Telephone Encounter (Addendum)
Dr. Diona Browner could do 02/17/2018 at 12:00 pm.  Please try to move.

## 2018-02-11 NOTE — Telephone Encounter (Signed)
I can see her, just not this week as I am out! Scheduled her  June 25, 27 or 28th.Faythe Ghee to put in 2 15 min slots.  Okay to work in.  Only if she cannot do one of these 3 days... Then make appt with other provider.

## 2018-02-13 ENCOUNTER — Encounter: Payer: Self-pay | Admitting: *Deleted

## 2018-02-17 ENCOUNTER — Ambulatory Visit (INDEPENDENT_AMBULATORY_CARE_PROVIDER_SITE_OTHER): Payer: Medicare Other | Admitting: Family Medicine

## 2018-02-17 ENCOUNTER — Encounter: Payer: Self-pay | Admitting: Family Medicine

## 2018-02-17 VITALS — BP 120/60 | HR 80 | Temp 98.0°F | Ht 62.0 in | Wt 170.2 lb

## 2018-02-17 DIAGNOSIS — E7800 Pure hypercholesterolemia, unspecified: Secondary | ICD-10-CM

## 2018-02-17 DIAGNOSIS — I10 Essential (primary) hypertension: Secondary | ICD-10-CM

## 2018-02-17 DIAGNOSIS — Z01818 Encounter for other preprocedural examination: Secondary | ICD-10-CM | POA: Insufficient documentation

## 2018-02-17 DIAGNOSIS — E119 Type 2 diabetes mellitus without complications: Secondary | ICD-10-CM | POA: Diagnosis not present

## 2018-02-17 NOTE — Progress Notes (Signed)
Patient ID: Katie [name], female DOB: [birth year], 72 y.o. MRN: [id number]  HPI  72 year old female pt with history of Obesity,DM, high cholesterol, HTN presents for preop eval for upcoming TKR 7/12.  Has Pre-op labs in 1 weeks.  Her DM and cholesterol has been well controlled in past.  Recent Labs       Lab Results  Component Value Date   HGBA1C 6.9 (H) 10/13/2017   Recent Labs       Lab Results  Component Value Date   CHOL 150 10/13/2017   HDL 34.40 (L) 10/13/2017   LDLCALC 79 04/01/2016   LDLDIRECT 84.0 10/13/2017   TRIG 208.0 (H) 10/13/2017   CHOLHDL 4 10/13/2017  She has had NO complication with past surgeries and anesthesia.  Most recent was gallbladder 2017 and right hip replacement 03/2013.  Her BP is well controlled  no CP, no dysphagia, no SOB.  No swelling in ankles.     BP Readings from Last 3 Encounters:  02/17/18 120/60  11/07/17 116/64  11/03/17 (!) 145/86  EKG: normal EKG, normal sinus rhythm, low voltage unchanged from previous tracings.  Blood pressure 120/60, pulse 80, temperature 98 F (36.7 C), temperature source Oral, height 5\' 2"  (1.575 m), weight 170 lb 4 oz (77.2 kg), last menstrual period 08/26/1992.  Review of Systems  Constitutional: Negative for fatigue and fever.  HENT: Negative for congestion.  Eyes: Negative for pain.  Respiratory: Negative for cough and shortness of breath.  Cardiovascular: Negative for chest pain, palpitations and leg swelling.  Gastrointestinal: Negative for abdominal pain.  Genitourinary: Negative for dysuria and vaginal bleeding.  Musculoskeletal: Negative for back pain.  Neurological: Negative for syncope, light-headedness and headaches.  Psychiatric/Behavioral: Negative for dysphoric mood.   Objective:  Physical Exam  Constitutional: Vital signs are normal. She appears well-developed and well-nourished. She is cooperative. Non-toxic appearance. She does not appear ill. No distress.  HENT:  Head: Normocephalic.   Right Ear: Hearing, tympanic membrane, external ear and ear canal normal.  Left Ear: Hearing, tympanic membrane, external ear and ear canal normal.  Nose: Nose normal.  Eyes: Pupils are equal, round, and reactive to light. Conjunctivae, EOM and lids are normal. Lids are everted and swept, no foreign bodies found.  Neck: Trachea normal and normal range of motion. Neck supple. Carotid bruit is not present. No thyroid mass and no thyromegaly present.  Cardiovascular: Normal rate, regular rhythm, S1 normal, S2 normal, normal heart sounds and intact distal pulses. Exam reveals no gallop.  No murmur heard.  Pulmonary/Chest: Effort normal and breath sounds normal. No respiratory distress. She has no wheezes. She has no rhonchi. She has no rales.  Abdominal: Soft. Normal appearance and bowel sounds are normal. She exhibits no distension, no fluid wave, no abdominal bruit and no mass. There is no hepatosplenomegaly. There is no tenderness. There is no rebound, no guarding and no CVA tenderness. No hernia.  Lymphadenopathy:  She has no cervical adenopathy.  She has no axillary adenopathy.  Neurological: She is alert. She has normal strength. No cranial nerve deficit or sensory deficit.  Skin: Skin is warm, dry and intact. No rash noted.  Psychiatric: Her speech is normal and behavior is normal. Judgment normal. Her mood appears not anxious. Cognition and memory are normal. She does not exhibit a depressed mood.   Assessment & Plan:

## 2018-02-17 NOTE — Assessment & Plan Note (Signed)
Well controlled. Continue current medication.  

## 2018-02-17 NOTE — Progress Notes (Signed)
Subjective:    Patient ID: Katie [name], female    DOB: 1945-12-09, 72 y.o.   MRN: [id number]  HPI  72 year old female pt with history of  Obesity,DM, high cholesterol, HTN presents for preop eval for upcoming TKR 7/12.  Has  Pre-op labs in 1 weeks.  Her DM and cholesterol has been well controlled in past.  Recent Labs       Lab Results  Component Value Date   HGBA1C 6.9 (H) 10/13/2017     Recent Labs       Lab Results  Component Value Date   CHOL 150 10/13/2017   HDL 34.40 (L) 10/13/2017   LDLCALC 79 04/01/2016   LDLDIRECT 84.0 10/13/2017   TRIG 208.0 (H) 10/13/2017   CHOLHDL 4 10/13/2017     She has had NO complication with past surgeries and anesthesia.  Most recent was gallbladder 2017 and right hip  replacement 03/2013.  Her BP is well controlled   no CP, no dysphagia, no SOB. No swelling in ankles.    BP Readings from Last 3 Encounters:  02/17/18 120/60  11/07/17 116/64  11/03/17 (!) 145/86   EKG: normal EKG, normal sinus rhythm, low voltage unchanged from previous tracings.   Blood pressure 120/60, pulse 80, temperature 98 F (36.7 C), temperature source Oral, height 5\' 2"  (1.575 m), weight 170 lb 4 oz (77.2 kg), last menstrual period 08/26/1992.   Review of Systems  Constitutional: Negative for fatigue and fever.  HENT: Negative for congestion.   Eyes: Negative for pain.  Respiratory: Negative for cough and shortness of breath.   Cardiovascular: Negative for chest pain, palpitations and leg swelling.  Gastrointestinal: Negative for abdominal pain.  Genitourinary: Negative for dysuria and vaginal bleeding.  Musculoskeletal: Negative for back pain.  Neurological: Negative for syncope, light-headedness and headaches.  Psychiatric/Behavioral: Negative for dysphoric mood.       Objective:   Physical Exam  Constitutional: Vital signs are normal. She appears well-developed and well-nourished. She is cooperative.  Non-toxic  appearance. She does not appear ill. No distress.  HENT:  Head: Normocephalic.  Right Ear: Hearing, tympanic membrane, external ear and ear canal normal.  Left Ear: Hearing, tympanic membrane, external ear and ear canal normal.  Nose: Nose normal.  Eyes: Pupils are equal, round, and reactive to light. Conjunctivae, EOM and lids are normal. Lids are everted and swept, no foreign bodies found.  Neck: Trachea normal and normal range of motion. Neck supple. Carotid bruit is not present. No thyroid mass and no thyromegaly present.  Cardiovascular: Normal rate, regular rhythm, S1 normal, S2 normal, normal heart sounds and intact distal pulses. Exam reveals no gallop.  No murmur heard. Pulmonary/Chest: Effort normal and breath sounds normal. No respiratory distress. She has no wheezes. She has no rhonchi. She has no rales.  Abdominal: Soft. Normal appearance and bowel sounds are normal. She exhibits no distension, no fluid wave, no abdominal bruit and no mass. There is no hepatosplenomegaly. There is no tenderness. There is no rebound, no guarding and no CVA tenderness. No hernia.  Lymphadenopathy:    She has no cervical adenopathy.    She has no axillary adenopathy.  Neurological: She is alert. She has normal strength. No cranial nerve deficit or sensory deficit.  Skin: Skin is warm, dry and intact. No rash noted.  Psychiatric: Her speech is normal and behavior is normal. Judgment normal. Her mood appears not anxious. Cognition and memory are normal. She does not exhibit a  depressed mood.          Assessment & Plan:

## 2018-02-17 NOTE — Assessment & Plan Note (Addendum)
Moderate risk surgery.. Pt low to moderate risk, asymptomatic. Cleared for surgery.

## 2018-02-17 NOTE — Patient Instructions (Signed)
We will send clearance letter over to  Dr. Jefm Bryant ASAP.

## 2018-02-17 NOTE — Assessment & Plan Note (Signed)
Well controlled 

## 2018-02-18 ENCOUNTER — Ambulatory Visit: Payer: Medicare Other | Admitting: Family Medicine

## 2018-02-20 DIAGNOSIS — Z01812 Encounter for preprocedural laboratory examination: Secondary | ICD-10-CM | POA: Diagnosis not present

## 2018-02-20 DIAGNOSIS — Z01818 Encounter for other preprocedural examination: Secondary | ICD-10-CM | POA: Diagnosis not present

## 2018-02-20 DIAGNOSIS — M1712 Unilateral primary osteoarthritis, left knee: Secondary | ICD-10-CM | POA: Diagnosis not present

## 2018-03-06 DIAGNOSIS — Z96652 Presence of left artificial knee joint: Secondary | ICD-10-CM | POA: Diagnosis not present

## 2018-03-06 DIAGNOSIS — Z7984 Long term (current) use of oral hypoglycemic drugs: Secondary | ICD-10-CM | POA: Diagnosis not present

## 2018-03-06 DIAGNOSIS — D649 Anemia, unspecified: Secondary | ICD-10-CM | POA: Diagnosis present

## 2018-03-06 DIAGNOSIS — R339 Retention of urine, unspecified: Secondary | ICD-10-CM | POA: Diagnosis not present

## 2018-03-06 DIAGNOSIS — E7800 Pure hypercholesterolemia, unspecified: Secondary | ICD-10-CM | POA: Diagnosis present

## 2018-03-06 DIAGNOSIS — M1712 Unilateral primary osteoarthritis, left knee: Secondary | ICD-10-CM | POA: Diagnosis present

## 2018-03-06 DIAGNOSIS — M25562 Pain in left knee: Secondary | ICD-10-CM | POA: Diagnosis not present

## 2018-03-06 DIAGNOSIS — E871 Hypo-osmolality and hyponatremia: Secondary | ICD-10-CM | POA: Diagnosis not present

## 2018-03-06 DIAGNOSIS — E876 Hypokalemia: Secondary | ICD-10-CM | POA: Diagnosis not present

## 2018-03-06 DIAGNOSIS — K219 Gastro-esophageal reflux disease without esophagitis: Secondary | ICD-10-CM | POA: Diagnosis present

## 2018-03-06 DIAGNOSIS — I10 Essential (primary) hypertension: Secondary | ICD-10-CM | POA: Diagnosis present

## 2018-03-06 DIAGNOSIS — E785 Hyperlipidemia, unspecified: Secondary | ICD-10-CM | POA: Diagnosis present

## 2018-03-06 DIAGNOSIS — E119 Type 2 diabetes mellitus without complications: Secondary | ICD-10-CM | POA: Diagnosis present

## 2018-03-06 DIAGNOSIS — G8918 Other acute postprocedural pain: Secondary | ICD-10-CM | POA: Diagnosis not present

## 2018-03-09 DIAGNOSIS — E119 Type 2 diabetes mellitus without complications: Secondary | ICD-10-CM | POA: Diagnosis not present

## 2018-03-09 DIAGNOSIS — Z96652 Presence of left artificial knee joint: Secondary | ICD-10-CM | POA: Diagnosis not present

## 2018-03-09 DIAGNOSIS — D649 Anemia, unspecified: Secondary | ICD-10-CM | POA: Diagnosis not present

## 2018-03-09 DIAGNOSIS — Z7984 Long term (current) use of oral hypoglycemic drugs: Secondary | ICD-10-CM | POA: Diagnosis not present

## 2018-03-09 DIAGNOSIS — Z471 Aftercare following joint replacement surgery: Secondary | ICD-10-CM | POA: Diagnosis not present

## 2018-03-09 DIAGNOSIS — I10 Essential (primary) hypertension: Secondary | ICD-10-CM | POA: Diagnosis not present

## 2018-03-10 DIAGNOSIS — M25669 Stiffness of unspecified knee, not elsewhere classified: Secondary | ICD-10-CM | POA: Diagnosis not present

## 2018-03-10 DIAGNOSIS — Z96659 Presence of unspecified artificial knee joint: Secondary | ICD-10-CM | POA: Diagnosis not present

## 2018-03-10 DIAGNOSIS — T8489XA Other specified complication of internal orthopedic prosthetic devices, implants and grafts, initial encounter: Secondary | ICD-10-CM | POA: Diagnosis not present

## 2018-03-11 DIAGNOSIS — D649 Anemia, unspecified: Secondary | ICD-10-CM | POA: Diagnosis not present

## 2018-03-11 DIAGNOSIS — E119 Type 2 diabetes mellitus without complications: Secondary | ICD-10-CM | POA: Diagnosis not present

## 2018-03-11 DIAGNOSIS — Z96652 Presence of left artificial knee joint: Secondary | ICD-10-CM | POA: Diagnosis not present

## 2018-03-11 DIAGNOSIS — I10 Essential (primary) hypertension: Secondary | ICD-10-CM | POA: Diagnosis not present

## 2018-03-11 DIAGNOSIS — Z7984 Long term (current) use of oral hypoglycemic drugs: Secondary | ICD-10-CM | POA: Diagnosis not present

## 2018-03-11 DIAGNOSIS — Z471 Aftercare following joint replacement surgery: Secondary | ICD-10-CM | POA: Diagnosis not present

## 2018-03-13 DIAGNOSIS — E119 Type 2 diabetes mellitus without complications: Secondary | ICD-10-CM | POA: Diagnosis not present

## 2018-03-13 DIAGNOSIS — Z96652 Presence of left artificial knee joint: Secondary | ICD-10-CM | POA: Diagnosis not present

## 2018-03-13 DIAGNOSIS — D649 Anemia, unspecified: Secondary | ICD-10-CM | POA: Diagnosis not present

## 2018-03-13 DIAGNOSIS — Z7984 Long term (current) use of oral hypoglycemic drugs: Secondary | ICD-10-CM | POA: Diagnosis not present

## 2018-03-13 DIAGNOSIS — I10 Essential (primary) hypertension: Secondary | ICD-10-CM | POA: Diagnosis not present

## 2018-03-13 DIAGNOSIS — Z471 Aftercare following joint replacement surgery: Secondary | ICD-10-CM | POA: Diagnosis not present

## 2018-03-16 ENCOUNTER — Encounter: Payer: Self-pay | Admitting: Family Medicine

## 2018-03-16 ENCOUNTER — Other Ambulatory Visit: Payer: Self-pay

## 2018-03-16 DIAGNOSIS — Z7984 Long term (current) use of oral hypoglycemic drugs: Secondary | ICD-10-CM | POA: Diagnosis not present

## 2018-03-16 DIAGNOSIS — D649 Anemia, unspecified: Secondary | ICD-10-CM | POA: Diagnosis not present

## 2018-03-16 DIAGNOSIS — I10 Essential (primary) hypertension: Secondary | ICD-10-CM | POA: Diagnosis not present

## 2018-03-16 DIAGNOSIS — E119 Type 2 diabetes mellitus without complications: Secondary | ICD-10-CM | POA: Diagnosis not present

## 2018-03-16 DIAGNOSIS — Z471 Aftercare following joint replacement surgery: Secondary | ICD-10-CM | POA: Diagnosis not present

## 2018-03-16 DIAGNOSIS — Z96652 Presence of left artificial knee joint: Secondary | ICD-10-CM | POA: Diagnosis not present

## 2018-03-16 NOTE — Telephone Encounter (Signed)
I spoke with pt; pt requesting kenalog cream for reddened area on rt hip; pt has had this in the past and due to recent knee surgery pt cannot come in for appt. [facility] [address]. Last seen 02/17/18 for surgical clearance.Please advise.

## 2018-03-16 NOTE — Telephone Encounter (Signed)
Copied from [place] [phone] [phone]. Topic: General - Other >> Mar 16, 2018  9:58 AM Yvette Rack wrote: Reason for CRM: Pt states she has a recurring bump on her hip and she needs a Rx for the topical medication that is normally prescribed. Pt states she can not coming in to the office because she is recovering from knee surgery. Pt requests a call back. Cb# [phone] or [phone]

## 2018-03-17 MED ORDER — VALACYCLOVIR HCL 500 MG PO TABS: 500.0000 mg | ORAL_TABLET | Freq: Two times a day (BID) | ORAL | 2 refills | Status: DC

## 2018-03-17 MED ORDER — TRIAMCINOLONE ACETONIDE 0.1 % EX CREA: 1.0000 "application " | TOPICAL_CREAM | Freq: Two times a day (BID) | CUTANEOUS | 0 refills | Status: DC

## 2018-03-17 NOTE — Telephone Encounter (Signed)
Pt notified Katie [name] was sent to [facility] [address], pt voiced understanding; pt responded to email and has taken valtrex in the past and if Dr Diona Browner wants to send valtrex in that would be OK. Pt request cb or Dr Diona Browner can respond thru mychart message.

## 2018-03-18 DIAGNOSIS — I10 Essential (primary) hypertension: Secondary | ICD-10-CM | POA: Diagnosis not present

## 2018-03-18 DIAGNOSIS — D649 Anemia, unspecified: Secondary | ICD-10-CM | POA: Diagnosis not present

## 2018-03-18 DIAGNOSIS — Z96652 Presence of left artificial knee joint: Secondary | ICD-10-CM | POA: Diagnosis not present

## 2018-03-18 DIAGNOSIS — E119 Type 2 diabetes mellitus without complications: Secondary | ICD-10-CM | POA: Diagnosis not present

## 2018-03-18 DIAGNOSIS — Z7984 Long term (current) use of oral hypoglycemic drugs: Secondary | ICD-10-CM | POA: Diagnosis not present

## 2018-03-18 DIAGNOSIS — Z471 Aftercare following joint replacement surgery: Secondary | ICD-10-CM | POA: Diagnosis not present

## 2018-03-20 DIAGNOSIS — I10 Essential (primary) hypertension: Secondary | ICD-10-CM | POA: Diagnosis not present

## 2018-03-20 DIAGNOSIS — Z96652 Presence of left artificial knee joint: Secondary | ICD-10-CM | POA: Diagnosis not present

## 2018-03-20 DIAGNOSIS — E119 Type 2 diabetes mellitus without complications: Secondary | ICD-10-CM | POA: Diagnosis not present

## 2018-03-20 DIAGNOSIS — Z471 Aftercare following joint replacement surgery: Secondary | ICD-10-CM | POA: Diagnosis not present

## 2018-03-20 DIAGNOSIS — D649 Anemia, unspecified: Secondary | ICD-10-CM | POA: Diagnosis not present

## 2018-03-20 DIAGNOSIS — Z7984 Long term (current) use of oral hypoglycemic drugs: Secondary | ICD-10-CM | POA: Diagnosis not present

## 2018-03-23 DIAGNOSIS — I10 Essential (primary) hypertension: Secondary | ICD-10-CM | POA: Diagnosis not present

## 2018-03-23 DIAGNOSIS — Z7984 Long term (current) use of oral hypoglycemic drugs: Secondary | ICD-10-CM | POA: Diagnosis not present

## 2018-03-23 DIAGNOSIS — E119 Type 2 diabetes mellitus without complications: Secondary | ICD-10-CM | POA: Diagnosis not present

## 2018-03-23 DIAGNOSIS — Z96652 Presence of left artificial knee joint: Secondary | ICD-10-CM | POA: Diagnosis not present

## 2018-03-23 DIAGNOSIS — Z471 Aftercare following joint replacement surgery: Secondary | ICD-10-CM | POA: Diagnosis not present

## 2018-03-23 DIAGNOSIS — D649 Anemia, unspecified: Secondary | ICD-10-CM | POA: Diagnosis not present

## 2018-03-25 DIAGNOSIS — D649 Anemia, unspecified: Secondary | ICD-10-CM | POA: Diagnosis not present

## 2018-03-25 DIAGNOSIS — Z471 Aftercare following joint replacement surgery: Secondary | ICD-10-CM | POA: Diagnosis not present

## 2018-03-25 DIAGNOSIS — E119 Type 2 diabetes mellitus without complications: Secondary | ICD-10-CM | POA: Diagnosis not present

## 2018-03-25 DIAGNOSIS — Z96652 Presence of left artificial knee joint: Secondary | ICD-10-CM | POA: Diagnosis not present

## 2018-03-25 DIAGNOSIS — Z7984 Long term (current) use of oral hypoglycemic drugs: Secondary | ICD-10-CM | POA: Diagnosis not present

## 2018-03-25 DIAGNOSIS — I10 Essential (primary) hypertension: Secondary | ICD-10-CM | POA: Diagnosis not present

## 2018-03-26 DIAGNOSIS — M6281 Muscle weakness (generalized): Secondary | ICD-10-CM | POA: Diagnosis not present

## 2018-03-26 DIAGNOSIS — Z96652 Presence of left artificial knee joint: Secondary | ICD-10-CM | POA: Diagnosis not present

## 2018-03-26 DIAGNOSIS — M25662 Stiffness of left knee, not elsewhere classified: Secondary | ICD-10-CM | POA: Diagnosis not present

## 2018-03-26 DIAGNOSIS — M25562 Pain in left knee: Secondary | ICD-10-CM | POA: Diagnosis not present

## 2018-03-30 DIAGNOSIS — M25562 Pain in left knee: Secondary | ICD-10-CM | POA: Diagnosis not present

## 2018-03-30 DIAGNOSIS — Z96652 Presence of left artificial knee joint: Secondary | ICD-10-CM | POA: Diagnosis not present

## 2018-04-01 DIAGNOSIS — M25562 Pain in left knee: Secondary | ICD-10-CM | POA: Diagnosis not present

## 2018-04-03 DIAGNOSIS — M25562 Pain in left knee: Secondary | ICD-10-CM | POA: Diagnosis not present

## 2018-04-03 DIAGNOSIS — Z96652 Presence of left artificial knee joint: Secondary | ICD-10-CM | POA: Diagnosis not present

## 2018-04-03 DIAGNOSIS — L661 Lichen planopilaris: Secondary | ICD-10-CM | POA: Diagnosis not present

## 2018-04-06 DIAGNOSIS — Z96652 Presence of left artificial knee joint: Secondary | ICD-10-CM | POA: Diagnosis not present

## 2018-04-06 DIAGNOSIS — M25562 Pain in left knee: Secondary | ICD-10-CM | POA: Diagnosis not present

## 2018-04-08 DIAGNOSIS — M25562 Pain in left knee: Secondary | ICD-10-CM | POA: Diagnosis not present

## 2018-04-08 DIAGNOSIS — Z96652 Presence of left artificial knee joint: Secondary | ICD-10-CM | POA: Diagnosis not present

## 2018-04-10 DIAGNOSIS — M25562 Pain in left knee: Secondary | ICD-10-CM | POA: Diagnosis not present

## 2018-04-10 DIAGNOSIS — Z96652 Presence of left artificial knee joint: Secondary | ICD-10-CM | POA: Diagnosis not present

## 2018-04-13 ENCOUNTER — Other Ambulatory Visit: Payer: Medicare Other

## 2018-04-15 DIAGNOSIS — Z96652 Presence of left artificial knee joint: Secondary | ICD-10-CM | POA: Diagnosis not present

## 2018-04-15 DIAGNOSIS — M25562 Pain in left knee: Secondary | ICD-10-CM | POA: Diagnosis not present

## 2018-04-16 ENCOUNTER — Ambulatory Visit: Payer: Medicare Other | Admitting: Family Medicine

## 2018-04-17 DIAGNOSIS — Z96652 Presence of left artificial knee joint: Secondary | ICD-10-CM | POA: Diagnosis not present

## 2018-04-20 DIAGNOSIS — Z96652 Presence of left artificial knee joint: Secondary | ICD-10-CM | POA: Diagnosis not present

## 2018-04-20 DIAGNOSIS — M25562 Pain in left knee: Secondary | ICD-10-CM | POA: Diagnosis not present

## 2018-04-23 DIAGNOSIS — M25562 Pain in left knee: Secondary | ICD-10-CM | POA: Diagnosis not present

## 2018-04-29 DIAGNOSIS — Z96652 Presence of left artificial knee joint: Secondary | ICD-10-CM | POA: Diagnosis not present

## 2018-04-29 DIAGNOSIS — M25562 Pain in left knee: Secondary | ICD-10-CM | POA: Diagnosis not present

## 2018-05-01 DIAGNOSIS — M25562 Pain in left knee: Secondary | ICD-10-CM | POA: Diagnosis not present

## 2018-05-01 DIAGNOSIS — Z96652 Presence of left artificial knee joint: Secondary | ICD-10-CM | POA: Diagnosis not present

## 2018-05-04 DIAGNOSIS — Z96652 Presence of left artificial knee joint: Secondary | ICD-10-CM | POA: Diagnosis not present

## 2018-05-04 DIAGNOSIS — M25562 Pain in left knee: Secondary | ICD-10-CM | POA: Diagnosis not present

## 2018-05-08 DIAGNOSIS — Z96652 Presence of left artificial knee joint: Secondary | ICD-10-CM | POA: Diagnosis not present

## 2018-05-08 DIAGNOSIS — M25562 Pain in left knee: Secondary | ICD-10-CM | POA: Diagnosis not present

## 2018-06-03 DIAGNOSIS — M1712 Unilateral primary osteoarthritis, left knee: Secondary | ICD-10-CM | POA: Diagnosis not present

## 2018-07-17 DIAGNOSIS — L668 Other cicatricial alopecia: Secondary | ICD-10-CM | POA: Diagnosis not present

## 2018-08-06 DIAGNOSIS — E669 Obesity, unspecified: Secondary | ICD-10-CM | POA: Diagnosis not present

## 2018-08-06 DIAGNOSIS — M1712 Unilateral primary osteoarthritis, left knee: Secondary | ICD-10-CM | POA: Diagnosis not present

## 2018-08-06 DIAGNOSIS — E119 Type 2 diabetes mellitus without complications: Secondary | ICD-10-CM | POA: Diagnosis not present

## 2018-08-06 DIAGNOSIS — Z96652 Presence of left artificial knee joint: Secondary | ICD-10-CM | POA: Diagnosis not present

## 2018-08-24 DIAGNOSIS — M65322 Trigger finger, left index finger: Secondary | ICD-10-CM | POA: Diagnosis not present

## 2018-08-28 ENCOUNTER — Encounter: Payer: Self-pay | Admitting: Family Medicine

## 2018-08-28 ENCOUNTER — Ambulatory Visit (INDEPENDENT_AMBULATORY_CARE_PROVIDER_SITE_OTHER): Payer: Medicare Other | Admitting: Family Medicine

## 2018-08-28 DIAGNOSIS — Z803 Family history of malignant neoplasm of breast: Secondary | ICD-10-CM

## 2018-08-28 DIAGNOSIS — Z9882 Breast implant status: Secondary | ICD-10-CM | POA: Insufficient documentation

## 2018-08-28 DIAGNOSIS — N61 Mastitis without abscess: Secondary | ICD-10-CM | POA: Diagnosis not present

## 2018-08-28 MED ORDER — AMOXICILLIN-POT CLAVULANATE 875-125 MG PO TABS: 1.0000 | ORAL_TABLET | Freq: Two times a day (BID) | ORAL | 0 refills | Status: DC

## 2018-08-28 NOTE — Progress Notes (Signed)
Subjective:    Patient ID: Katie [name], female    DOB: 03-25-1946, 73 y.o.   MRN: [id number]  HPI    73 year old female presents with left breast irritation.   She reports  1 week ago she noted redness around left nipple, sore and now spread down below the breast.  Pain 2/10   She has a biopsy site  ( from 1976)on left breast that is sore as well. Had 90% breast tissue removed prophylactically given sister died of breat cancer.  She has bilateral implants 2006 In past previous implants leaked  1999 leaked and again in 2006 and were replaced in 2006.   Last mammogram 08/2017  Sister with breast cancer.   nonsmoker, no fever  no lactation, no nipple discharge  Social History /Family History/Past Medical History reviewed in detail and updated in EMR if needed. Blood pressure 140/80, pulse 80, temperature 97.7 F (36.5 C), temperature source Oral, height 5\' 2"  (1.575 m), weight 168 lb (76.2 kg), last menstrual period 08/26/1992.   Review of Systems  Constitutional: Negative for fatigue and fever.  HENT: Negative for congestion.   Eyes: Negative for pain.  Respiratory: Negative for cough and shortness of breath.   Cardiovascular: Negative for chest pain, palpitations and leg swelling.  Gastrointestinal: Negative for abdominal pain.  Genitourinary: Negative for dysuria and vaginal bleeding.  Musculoskeletal: Negative for back pain.  Neurological: Negative for syncope, light-headedness and headaches.  Psychiatric/Behavioral: Negative for dysphoric mood.       Objective:   Physical Exam Constitutional:      General: She is not in acute distress.    Appearance: Normal appearance. She is well-developed. She is not ill-appearing or toxic-appearing.  HENT:     Head: Normocephalic.     Right Ear: Hearing, tympanic membrane, ear canal and external ear normal. Tympanic membrane is not erythematous, retracted or bulging.     Left Ear: Hearing, tympanic membrane, ear canal and  external ear normal. Tympanic membrane is not erythematous, retracted or bulging.     Nose: No mucosal edema or rhinorrhea.     Right Sinus: No maxillary sinus tenderness or frontal sinus tenderness.     Left Sinus: No maxillary sinus tenderness or frontal sinus tenderness.     Mouth/Throat:     Pharynx: Uvula midline.  Eyes:     General: Lids are normal. Lids are everted, no foreign bodies appreciated.     Conjunctiva/sclera: Conjunctivae normal.     Pupils: Pupils are equal, round, and reactive to light.  Neck:     Musculoskeletal: Normal range of motion and neck supple.     Thyroid: No thyroid mass or thyromegaly.     Vascular: No carotid bruit.     Trachea: Trachea normal.  Cardiovascular:     Rate and Rhythm: Normal rate and regular rhythm.     Pulses: Normal pulses.     Heart sounds: Normal heart sounds, S1 normal and S2 normal. No murmur. No friction rub. No gallop.   Pulmonary:     Effort: Pulmonary effort is normal. No tachypnea or respiratory distress.     Breath sounds: Normal breath sounds. No decreased breath sounds, wheezing, rhonchi or rales.  Chest:       Comments:  Areas of redness circled  Line is old biopsy scar.  Skin is warm, tender and mildly swollen.  Bilateral breast implants firm and nonmobile.  No lymphadenopathy bilaterally. Abdominal:     General: Bowel sounds are normal.  Palpations: Abdomen is soft.     Tenderness: There is no abdominal tenderness.  Skin:    General: Skin is warm and dry.     Findings: No rash.  Neurological:     Mental Status: She is alert.  Psychiatric:        Mood and Affect: Mood is not anxious or depressed.        Speech: Speech normal.        Behavior: Behavior normal. Behavior is cooperative.        Thought Content: Thought content normal.        Judgment: Judgment normal.           Assessment & Plan:

## 2018-08-28 NOTE — Patient Instructions (Addendum)
Start on augmentin x 10 days Please stop at the front desk to set up referral.

## 2018-08-31 ENCOUNTER — Telehealth: Payer: Self-pay | Admitting: Family Medicine

## 2018-08-31 DIAGNOSIS — M810 Age-related osteoporosis without current pathological fracture: Secondary | ICD-10-CM | POA: Diagnosis not present

## 2018-08-31 DIAGNOSIS — N6489 Other specified disorders of breast: Secondary | ICD-10-CM | POA: Diagnosis not present

## 2018-08-31 DIAGNOSIS — N6320 Unspecified lump in the left breast, unspecified quadrant: Secondary | ICD-10-CM | POA: Diagnosis not present

## 2018-08-31 DIAGNOSIS — N6321 Unspecified lump in the left breast, upper outer quadrant: Secondary | ICD-10-CM | POA: Diagnosis not present

## 2018-08-31 DIAGNOSIS — Z8262 Family history of osteoporosis: Secondary | ICD-10-CM | POA: Diagnosis not present

## 2018-08-31 DIAGNOSIS — R234 Changes in skin texture: Secondary | ICD-10-CM | POA: Diagnosis not present

## 2018-08-31 DIAGNOSIS — Z803 Family history of malignant neoplasm of breast: Secondary | ICD-10-CM | POA: Diagnosis not present

## 2018-08-31 DIAGNOSIS — L539 Erythematous condition, unspecified: Secondary | ICD-10-CM | POA: Diagnosis not present

## 2018-08-31 DIAGNOSIS — N644 Mastodynia: Secondary | ICD-10-CM | POA: Diagnosis not present

## 2018-08-31 LAB — HM DEXA SCAN

## 2018-08-31 NOTE — Telephone Encounter (Signed)
Pt called office stating she had a bone density this morning 08/31/18 and they went ahead and did her mammogram as well this morning.

## 2018-08-31 NOTE — Telephone Encounter (Signed)
Left message askign pt to call office regarding mammogram  Transfer to [place].  If i'm out please give to someone up front to help pt schedule appointment

## 2018-09-07 ENCOUNTER — Encounter: Payer: Self-pay | Admitting: Family Medicine

## 2018-09-07 ENCOUNTER — Other Ambulatory Visit: Payer: Self-pay | Admitting: Radiology

## 2018-09-07 DIAGNOSIS — N6321 Unspecified lump in the left breast, upper outer quadrant: Secondary | ICD-10-CM | POA: Diagnosis not present

## 2018-09-07 DIAGNOSIS — C50412 Malignant neoplasm of upper-outer quadrant of left female breast: Secondary | ICD-10-CM | POA: Diagnosis not present

## 2018-09-08 ENCOUNTER — Other Ambulatory Visit: Payer: Self-pay | Admitting: Family Medicine

## 2018-09-08 ENCOUNTER — Other Ambulatory Visit: Payer: Self-pay | Admitting: Radiology

## 2018-09-08 DIAGNOSIS — C50912 Malignant neoplasm of unspecified site of left female breast: Secondary | ICD-10-CM

## 2018-09-08 MED ORDER — AMOXICILLIN-POT CLAVULANATE 875-125 MG PO TABS: 1.0000 | ORAL_TABLET | Freq: Two times a day (BID) | ORAL | 0 refills | Status: DC

## 2018-09-09 ENCOUNTER — Encounter: Payer: Self-pay | Admitting: Family Medicine

## 2018-09-09 ENCOUNTER — Telehealth: Payer: Self-pay | Admitting: Hematology and Oncology

## 2018-09-09 ENCOUNTER — Ambulatory Visit
Admission: RE | Admit: 2018-09-09 | Discharge: 2018-09-09 | Disposition: A | Discharge status: Home / Self Care | Payer: Medicare Other | Source: Ambulatory Visit | Attending: Radiology | Admitting: Radiology

## 2018-09-09 DIAGNOSIS — C50412 Malignant neoplasm of upper-outer quadrant of left female breast: Secondary | ICD-10-CM | POA: Diagnosis not present

## 2018-09-09 DIAGNOSIS — C50912 Malignant neoplasm of unspecified site of left female breast: Secondary | ICD-10-CM

## 2018-09-09 MED ORDER — GADOBUTROL 1 MMOL/ML IV SOLN
7.0000 mL/kg | Freq: Once | INTRAVENOUS | Status: AC | PRN
  Administered 2018-09-09: 533 mL via INTRAVENOUS

## 2018-09-09 NOTE — Telephone Encounter (Signed)
Spoke with patient to confirm morning [facility] appointment for 1/22, [facility] patient appointment letter sent

## 2018-09-10 ENCOUNTER — Telehealth: Payer: Self-pay | Admitting: Family Medicine

## 2018-09-10 NOTE — Telephone Encounter (Signed)
Yes, results reviewed.

## 2018-09-10 NOTE — Telephone Encounter (Signed)
Dr Diona Browner  Your ordered dx mammogram and ultrasound  It looks like the pt had this done @ [facility] on 1/6.    Just wanted to make sure you saw the results

## 2018-09-11 ENCOUNTER — Encounter: Payer: Self-pay | Admitting: *Deleted

## 2018-09-11 DIAGNOSIS — Z170 Estrogen receptor positive status [ER+]: Principal | ICD-10-CM

## 2018-09-11 DIAGNOSIS — C50412 Malignant neoplasm of upper-outer quadrant of left female breast: Secondary | ICD-10-CM | POA: Insufficient documentation

## 2018-09-14 ENCOUNTER — Other Ambulatory Visit: Payer: Self-pay | Admitting: Radiology

## 2018-09-14 ENCOUNTER — Encounter: Payer: Self-pay | Admitting: Family Medicine

## 2018-09-14 DIAGNOSIS — N6011 Diffuse cystic mastopathy of right breast: Secondary | ICD-10-CM | POA: Diagnosis not present

## 2018-09-14 DIAGNOSIS — N6325 Unspecified lump in the left breast, overlapping quadrants: Secondary | ICD-10-CM | POA: Diagnosis not present

## 2018-09-14 DIAGNOSIS — C50411 Malignant neoplasm of upper-outer quadrant of right female breast: Secondary | ICD-10-CM | POA: Diagnosis not present

## 2018-09-14 DIAGNOSIS — Z803 Family history of malignant neoplasm of breast: Secondary | ICD-10-CM | POA: Diagnosis not present

## 2018-09-14 DIAGNOSIS — N6311 Unspecified lump in the right breast, upper outer quadrant: Secondary | ICD-10-CM | POA: Diagnosis not present

## 2018-09-14 DIAGNOSIS — C50412 Malignant neoplasm of upper-outer quadrant of left female breast: Secondary | ICD-10-CM | POA: Diagnosis not present

## 2018-09-14 DIAGNOSIS — C50812 Malignant neoplasm of overlapping sites of left female breast: Secondary | ICD-10-CM | POA: Diagnosis not present

## 2018-09-14 DIAGNOSIS — N6315 Unspecified lump in the right breast, overlapping quadrants: Secondary | ICD-10-CM | POA: Diagnosis not present

## 2018-09-14 DIAGNOSIS — N6314 Unspecified lump in the right breast, lower inner quadrant: Secondary | ICD-10-CM | POA: Diagnosis not present

## 2018-09-15 NOTE — Progress Notes (Signed)
[name] CONSULT NOTE  Patient Care Team: [name], MD as PCP - General (Family Medicine) Birder [name], MD as Referring Physician (Ophthalmology) [name], MD as Consulting Physician (Dermatology) [name], MD as Consulting Physician (Orthopedic Surgery) [name], MD as Consulting Physician (General Surgery) [name], MD as Consulting Physician (Dentistry) [name], MD as Consulting Physician (General Surgery) [name], MD as Consulting Physician (Hematology and Oncology) [name], MD as Attending Physician (Radiation Oncology)  CHIEF COMPLAINTS/PURPOSE OF CONSULTATION: Newly diagnosed breast cancer  HISTORY OF PRESENTING ILLNESS:  Katie [name] 73 y.o. 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 The cancer was detected on a diagnostic mammogram on 08/31/18 that was performed after redness and pain in the left breast. It showed a 1.4cm mass in the left breast 2cm from the nipple at the 2 o'clock position. A biopsy on 09/07/18 showed the cancer to be grade 2, HER2 positive, ER 95%, PR 40%, Ki67 50%. A biopsy on 09/14/18 showed 2 focuses of grade 2 invasive ductal carcinoma in the left breast. A breast MRI from 09/09/18 showed multiple nodules and masses spanning 7cm in the left breast, an indeterminate 8m area behind the right nipple, and no lymph node involvement. The MRI showed skin thickening in the left breast. She has a family history of breast cancer, her sister was diagnosed at 386 She has a history of saline breast implants with several ruptures.  She presents to the clinic today with her husband. She denies any history of hormone replacement therapy. She is very interested in a prescription for a wig. She notes a history of type II diabetes but denies any history of neuropathy. They noted [place] their [place]daughter [place] April  but will reschedule it. She is retired but works pFacilities managerat two [employer]   I reviewed her records extensively and collaborated the history with the patient.  SUMMARY OF ONCOLOGIC HISTORY:   Malignant neoplasm of upper-outer quadrant of left breast in female, [place] positive [place]   09/14/2018 Initial Diagnosis    With prior history of breast implants that were ruptured/replaced, patient had cellulitis LIQ left breast for 1 week, and UOQ asymmetry 1.5 cm, MRI right breast biopsy benign, left breast numerous masses 7 cm skin thickening: 2 biopsies from left breast: Grade 2-3 IDC ER PR positive, HER-2 positive, Ki-67 50%    09/16/2018 Cancer Staging    Staging form: Breast, AJCC 8th Edition - Clinical stage from 09/16/2018: Stage IIIB (cT4, cN0, cM0, G3, ER+, PR+, HER2+) - Signed by GNicholas Lose MD on 09/16/2018      MEDICAL HISTORY:  Past Medical History:  Diagnosis Date  . Allergy   . Anemia   . Blood transfusion without reported diagnosis   . Diverticulitis   . DJD (degenerative joint disease)   . DM type 2 (diabetes mellitus, type 2) ([place]   . Fibrocystic breast disease   . GERD (gastroesophageal reflux disease)   . History of chicken pox   . Hypercholesteremia   . Hypertension   . Lichen planopilaris   . Urinary incontinence     SURGICAL HISTORY: Past Surgical History:  Procedure Laterality Date  . BREAST SURGERY  02/1975   Left Breast Biopsy-Fibrocystic Disease  . BREAST SURGERY  10/1981   Bialteral Capsulectomy Breast Surgery  . BREAST SURGERY  07/1998   Replace Bilateral  Silicone Implants  . BREAST SURGERY  01/1976   Bilater breast surgery to remove fibrocystic tissue and replace with silicone implants  . CHOLECYSTECTOMY N/A 07/08/2016   Procedure: LAPAROSCOPIC CHOLECYSTECTOMY;  Surgeon: [name], MD;  Location: [facility] ORS;  Service: General;  Laterality: N/A;  . COLONOSCOPY WITH PROPOFOL N/A  11/07/2017   Procedure: COLONOSCOPY WITH PROPOFOL;  Surgeon: Jonathon Bellows, MD;  Location[facility] ENDOSCOPY;  Service: Gastroenterology;  Laterality: N/A;  . DILATION AND CURETTAGE OF UTERUS  01/1994  . ESOPHAGOGASTRODUODENOSCOPY (EGD) WITH PROPOFOL N/A 12/05/2016   Procedure: ESOPHAGOGASTRODUODENOSCOPY (EGD) WITH PROPOFOL;  Surgeon: Jonathon Bellows, MD;  Location: [facility] ENDOSCOPY;  Service: Endoscopy;  Laterality: N/A;  . FINGER SURGERY  2001 & 2003   Trigger Finger  . FOOT SURGERY  1980   To Relieve Pinched Nerve  . FOOT SURGERY  07/2008   Left Foot-Plantar Fasciitis  . JOINT REPLACEMENT  04/19/2013   Hip Replacement-Right  . PLACEMENT OF BREAST IMPLANTS  12/26/2004   Replace Failed Implants  . RHINOPLASTY  03/1964   To Correct Deviated Septum    SOCIAL HISTORY: Social History   Socioeconomic History  . Marital status: Married    Spouse name: Not on file  . Number of children: 1  . Years of education: Not on file  . Highest education level: Not on file  Occupational History  . Occupation: Retired  Scientific laboratory technician  . Financial resource strain: Not on file  . Food insecurity:    Worry: Not on file    Inability: Not on file  . Transportation needs:    Medical: Not on file    Non-medical: Not on file  Tobacco Use  . Smoking status: Never Smoker  . Smokeless tobacco: Never Used  Substance and Sexual Activity  . Alcohol use: No    Alcohol/week: 0.0 standard drinks  . Drug use: No  . Sexual activity: Never    Partners: Male  Lifestyle  . Physical activity:    Days per week: Not on file    Minutes per session: Not on file  . Stress: Not on file  Relationships  . Social connections:    Talks on phone: Not on file    Gets together: Not on file    Attends religious service: Not on file    Active member of club or organization: Not on file    Attends meetings of clubs or organizations: Not on file    Relationship status: Not on file  . Intimate partner violence:    Fear of current  or ex partner: Not on file    Emotionally abused: Not on file    Physically abused: Not on file    Forced sexual activity: Not on file  Other Topics Concern  . Not on file  Social History Narrative   Married for 37 years.    She has one child and one stepston.       FAMILY HISTORY: Family History  Problem Relation Age of Onset  . Arthritis Mother   . Hypertension Mother   . Heart disease Father   . Diabetes Father   . Breast cancer Sister   . Hypertension Brother   . Leukemia Brother   . Coronary artery disease Brother     ALLERGIES:  is allergic to erythromycin; ciprofloxacin; daypro [oxaprozin]; enalapril maleate; nsaids; and vioxx [rofecoxib].  MEDICATIONS:  Current Outpatient Medications  Medication Sig Dispense Refill  . atorvastatin (LIPITOR) 10 MG tablet Take 1 tablet (10 mg total) by  mouth daily. 90 tablet 3  . Azelastine HCl 137 MCG/SPRAY SOLN instill 1 spray into each nostril twice a day for 1 week then if needed 30 mL 5  . cholecalciferol (VITAMIN D) 400 units TABS tablet Take 400 Units by mouth 2 (two) times daily.    . clobetasol (TEMOVATE) 0.05 % external solution Apply 1 application topically. Reported on 08/16/2015    . fluticasone (FLONASE) 50 MCG/ACT nasal spray instill 2 sprays into each nostril once daily 16 g 5  . hydrochlorothiazide (HYDRODIURIL) 25 MG tablet Take 1 tablet (25 mg total) by mouth daily. 90 tablet 3  . loratadine (CLARITIN) 10 MG tablet Take 10 mg by mouth daily.    [name] losartan (COZAAR) 100 MG tablet Take 1 tablet (100 mg total) by mouth daily. 90 tablet 3  . metFORMIN (GLUCOPHAGE) 500 MG tablet Take 1 tablet (500 mg total) by mouth daily. 90 tablet 3  . metroNIDAZOLE (METROCREAM) 0.75 % cream Apply 1 application topically 2 (two) times daily as needed.    . triamcinolone cream (KENALOG) 0.1 % Apply 1 application topically 2 (two) times daily. 15 g 0  . valACYclovir (VALTREX) 500 MG tablet Take 1 tablet (500 mg total) by mouth 2 (two) times  daily. (Patient taking differently: Take 500 mg by mouth as needed. ) 6 tablet 2  . Calcium-Vitamin D-Vitamin K (VIACTIV) 941-740-81 MG-UNT-MCG CHEW Chew 1 each by mouth daily.    [name] glucose blood (FREESTYLE LITE) test strip Use to check blood sugar daily.  Dx: E11.9 50 each 12   No current facility-administered medications for this visit.     REVIEW OF SYSTEMS:   Constitutional: Denies fevers, chills or abnormal night sweats Eyes: Denies blurriness of vision, double vision or watery eyes Ears, nose, mouth, throat, and face: Denies mucositis or sore throat Respiratory: Denies cough, dyspnea or wheezes Cardiovascular: Denies palpitation, chest discomfort or lower extremity swelling Gastrointestinal:  Denies nausea, heartburn or change in bowel habits Skin: (+) cellulitis in left breast Lymphatics: Denies new lymphadenopathy or easy bruising Neurological:Denies numbness, tingling or new weaknesses Behavioral/Psych: Mood is stable, no new changes  Breast: Denies any discharge (+) pain and redness in left breast All other systems were reviewed with the patient and are negative.  PHYSICAL EXAMINATION: ECOG PERFORMANCE STATUS: 1 - Symptomatic but completely ambulatory  Vitals:   09/16/18 0839  BP: (!) 147/72  Pulse: 82  Resp: 18  Temp: 97.6 F (36.4 C)  SpO2: 98%   Filed Weights   09/16/18 0839  Weight: 167 lb 4.8 oz (75.9 kg)    GENERAL:alert, no distress and comfortable SKIN: skin color, texture, turgor are normal, no rashes or significant lesions EYES: normal, conjunctiva are pink and non-injected, sclera clear OROPHARYNX:no exudate, no erythema and lips, buccal mucosa, and tongue normal  NECK: supple, thyroid normal size, non-tender, without nodularity LYMPH:  no palpable lymphadenopathy in the cervical, axillary or inguinal LUNGS: clear to auscultation and percussion with normal breathing effort HEART: regular rate & rhythm and no murmurs and no lower extremity  edema ABDOMEN:abdomen soft, non-tender and normal bowel sounds Musculoskeletal:no cyanosis of digits and no clubbing  PSYCH: alert & oriented x 3 with fluent speech NEURO: no focal motor/sensory deficits BREAST: Redness and palpable thickening of the skin and nodularity in the left breast. No palpable axillary or supraclavicular lymphadenopathy (exam performed in the presence of a chaperone)   LABORATORY DATA:  I have reviewed the data as listed Lab Results  Component Value Date  WBC 4.4 09/16/2018   HGB 12.3 09/16/2018   HCT 35.1 (L) 09/16/2018   MCV 97.5 09/16/2018   PLT 210 09/16/2018   Lab Results  Component Value Date   NA 140 09/16/2018   K 3.8 09/16/2018   CL 103 09/16/2018   CO2 25 09/16/2018    RADIOGRAPHIC STUDIES: I have personally reviewed the radiological reports and agreed with the findings in the report.  ASSESSMENT AND PLAN:  Malignant neoplasm of upper-outer quadrant of left breast in female, estrogen receptor positive ([place]) With prior history of breast implants that were ruptured/replaced, patient had cellulitis LIQ left breast for 1 week, and UOQ asymmetry 1.5 cm, MRI right breast biopsy benign, left breast numerous masses 7 cm skin thickening: 2 biopsies from left breast: Grade 2-3 IDC ER PR positive, HER-2 positive, Ki-67 50% Clinical stage: T4 N0 M0 stage IIIb  Pathology and radiology counseling: Discussed with the patient, the details of pathology including the type of breast cancer,the clinical staging, the significance of ER, PR and HER-2/neu receptors and the implications for treatment. After reviewing the pathology in detail, we proceeded to discuss the different treatment options between surgery, radiation, chemotherapy, antiestrogen therapies.  Recommendation based on multidisciplinary tumor board: 1. Neoadjuvant chemotherapy with TCH Perjeta 6 cycles followed by Herceptin maintenance for 1 year 2. Followed by mastectomy with sentinel lymph node  study 3. Followed by adjuvant radiation therapy   Chemotherapy Counseling: I discussed the risks and benefits of chemotherapy including the risks of nausea/ vomiting, risk of infection from low WBC count, fatigue due to chemo or anemia, bruising or bleeding due to low platelets, mouth sores, loss/ change in taste and decreased appetite. Liver and kidney function will be monitored through out chemotherapy as abnormalities in liver and kidney function may be a side effect of treatment. Cardiac dysfunction due to Herceptin and Perjeta were discussed in detail. Risk of permanent bone marrow dysfunction due to chemo were also discussed.  Plan: 1. Port placement and skin biopsy at the same time 2. Echocardiogram 3. Chemotherapy class 4. Breast MRI 5. CT chest abdomen pelvis and bone scan for staging  Return to clinic in 2 weeks to start chemotherapy.    All questions were answered. The patient knows to call the clinic with any problems, questions or concerns.   [name], MD 09/16/2018   I, Cloyde Reams Dorshimer, am acting as scribe for [name], MD.  I have reviewed the above documentation for accuracy and completeness, and I agree with the above.

## 2018-09-16 ENCOUNTER — Encounter: Payer: Self-pay | Admitting: Physical Therapy

## 2018-09-16 ENCOUNTER — Other Ambulatory Visit: Payer: Self-pay | Admitting: *Deleted

## 2018-09-16 ENCOUNTER — Encounter: Payer: Self-pay | Admitting: Licensed Clinical Social Worker

## 2018-09-16 ENCOUNTER — Inpatient Hospital Stay: Payer: Medicare Other | Attending: Hematology and Oncology | Admitting: Hematology and Oncology

## 2018-09-16 ENCOUNTER — Encounter: Payer: Self-pay | Admitting: Hematology and Oncology

## 2018-09-16 ENCOUNTER — Ambulatory Visit (HOSPITAL_BASED_OUTPATIENT_CLINIC_OR_DEPARTMENT_OTHER): Payer: Medicare Other | Admitting: Licensed Clinical Social Worker

## 2018-09-16 ENCOUNTER — Encounter: Payer: Self-pay | Admitting: *Deleted

## 2018-09-16 ENCOUNTER — Inpatient Hospital Stay: Payer: Medicare Other

## 2018-09-16 ENCOUNTER — Ambulatory Visit: Payer: Medicare Other | Attending: General Surgery | Admitting: Physical Therapy

## 2018-09-16 ENCOUNTER — Ambulatory Visit
Admission: RE | Admit: 2018-09-16 | Discharge: 2018-09-16 | Disposition: A | Discharge status: Home / Self Care | Payer: Medicare Other | Source: Ambulatory Visit | Attending: Radiation Oncology | Admitting: Radiation Oncology

## 2018-09-16 ENCOUNTER — Other Ambulatory Visit: Payer: Self-pay

## 2018-09-16 VITALS — BP 147/72 | HR 82 | Temp 97.6°F | Resp 18 | Ht 62.0 in | Wt 167.3 lb

## 2018-09-16 DIAGNOSIS — C50412 Malignant neoplasm of upper-outer quadrant of left female breast: Secondary | ICD-10-CM

## 2018-09-16 DIAGNOSIS — Z170 Estrogen receptor positive status [ER+]: Principal | ICD-10-CM

## 2018-09-16 DIAGNOSIS — I10 Essential (primary) hypertension: Secondary | ICD-10-CM | POA: Insufficient documentation

## 2018-09-16 DIAGNOSIS — E119 Type 2 diabetes mellitus without complications: Secondary | ICD-10-CM | POA: Insufficient documentation

## 2018-09-16 DIAGNOSIS — C50912 Malignant neoplasm of unspecified site of left female breast: Secondary | ICD-10-CM | POA: Diagnosis not present

## 2018-09-16 DIAGNOSIS — Z803 Family history of malignant neoplasm of breast: Secondary | ICD-10-CM | POA: Diagnosis not present

## 2018-09-16 DIAGNOSIS — R293 Abnormal posture: Secondary | ICD-10-CM | POA: Insufficient documentation

## 2018-09-16 DIAGNOSIS — Z806 Family history of leukemia: Secondary | ICD-10-CM

## 2018-09-16 DIAGNOSIS — Z9889 Other specified postprocedural states: Secondary | ICD-10-CM | POA: Diagnosis not present

## 2018-09-16 LAB — CBC WITH DIFFERENTIAL (CANCER CENTER ONLY)
Abs Immature Granulocytes: 0.01 10*3/uL (ref 0.00–0.07)
Basophils Absolute: 0 10*3/uL (ref 0.0–0.1)
Basophils Relative: 1 %
Eosinophils Absolute: 0.1 10*3/uL (ref 0.0–0.5)
Eosinophils Relative: 3 %
HCT: 35.1 % — ABNORMAL LOW (ref 36.0–46.0)
HEMOGLOBIN: 12.3 g/dL (ref 12.0–15.0)
Immature Granulocytes: 0 %
Lymphocytes Relative: 35 %
Lymphs Abs: 1.5 10*3/uL (ref 0.7–4.0)
MCH: 34.2 pg — ABNORMAL HIGH (ref 26.0–34.0)
MCHC: 35 g/dL (ref 30.0–36.0)
MCV: 97.5 fL (ref 80.0–100.0)
Monocytes Absolute: 0.4 10*3/uL (ref 0.1–1.0)
Monocytes Relative: 10 %
Neutro Abs: 2.3 10*3/uL (ref 1.7–7.7)
Neutrophils Relative %: 51 %
Platelet Count: 210 10*3/uL (ref 150–400)
RBC: 3.6 MIL/uL — ABNORMAL LOW (ref 3.87–5.11)
RDW: 11.8 % (ref 11.5–15.5)
WBC Count: 4.4 10*3/uL (ref 4.0–10.5)
nRBC: 0 % (ref 0.0–0.2)

## 2018-09-16 LAB — CMP (CANCER CENTER ONLY)
ALT: 23 U/L (ref 0–44)
AST: 17 U/L (ref 15–41)
Albumin: 4.1 g/dL (ref 3.5–5.0)
Alkaline Phosphatase: 72 U/L (ref 38–126)
Anion gap: 12 (ref 5–15)
BUN: 24 mg/dL — ABNORMAL HIGH (ref 8–23)
CHLORIDE: 103 mmol/L (ref 98–111)
CO2: 25 mmol/L (ref 22–32)
Calcium: 10.5 mg/dL — ABNORMAL HIGH (ref 8.9–10.3)
Creatinine: 1.05 mg/dL — ABNORMAL HIGH (ref 0.44–1.00)
GFR, EST NON AFRICAN AMERICAN: 53 mL/min — AB (ref >60)
GFR, Est AFR Am: >60 mL/min (ref >60)
Glucose, Bld: 162 mg/dL — ABNORMAL HIGH (ref 70–99)
Potassium: 3.8 mmol/L (ref 3.5–5.1)
Sodium: 140 mmol/L (ref 135–145)
Total Bilirubin: 0.7 mg/dL (ref 0.3–1.2)
Total Protein: 7.6 g/dL (ref 6.5–8.1)

## 2018-09-16 MED ORDER — ONDANSETRON HCL 8 MG PO TABS: 8.0000 mg | ORAL_TABLET | Freq: Two times a day (BID) | ORAL | 1 refills | Status: DC | PRN

## 2018-09-16 MED ORDER — LORAZEPAM 0.5 MG PO TABS: 0.5000 mg | ORAL_TABLET | Freq: Every evening | ORAL | 0 refills | Status: DC | PRN

## 2018-09-16 MED ORDER — PROCHLORPERAZINE MALEATE 10 MG PO TABS: 10.0000 mg | ORAL_TABLET | Freq: Four times a day (QID) | ORAL | 1 refills | Status: DC | PRN

## 2018-09-16 MED ORDER — LIDOCAINE-PRILOCAINE 2.5-2.5 % EX CREA: TOPICAL_CREAM | CUTANEOUS | 3 refills | Status: DC

## 2018-09-16 NOTE — Progress Notes (Signed)
START ON PATHWAY REGIMEN - Breast     A cycle is every 21 days:     Pertuzumab      Pertuzumab      Trastuzumab-xxxx      Trastuzumab-xxxx      Carboplatin      Docetaxel   **Always confirm dose/schedule in your pharmacy ordering system**  Patient Characteristics: Preoperative or Nonsurgical Candidate (Clinical Staging), Neoadjuvant Therapy followed by Surgery, Invasive Disease, Chemotherapy, HER2 Positive, ER Positive Therapeutic Status: Preoperative or Nonsurgical Candidate (Clinical Staging) AJCC M Category: cM0 AJCC Grade: G3 Breast Surgical Plan: Neoadjuvant Therapy followed by Surgery ER Status: Positive (+) AJCC 8 Stage Grouping: IIIB HER2 Status: Positive (+) AJCC T Category: cT4 AJCC N Category: cN0 PR Status: Positive (+) Intent of Therapy: Curative Intent, Discussed with Patient

## 2018-09-16 NOTE — Therapy (Signed)
[place], [place], [address] Phone: [phone]   Fax:  [phone]  Physical Therapy Evaluation  Patient Details  Name: Katie [name] MRN: [id number] Date of Birth: [birth year] Referring Provider (PT): Dr. [name]   Encounter Date: 09/16/2018  PT End of Session - 09/16/18 1056    Visit Number  1    Number of Visits  1    PT Start Time  1055    PT Stop Time  1125    PT Time Calculation (min)  30 min    Activity Tolerance  Patient tolerated treatment well    Behavior During Therapy  [facility] for tasks assessed/performed       Past Medical History:  Diagnosis Date  . Allergy   . Anemia   . Blood transfusion without reported diagnosis   . Diverticulitis   . DJD (degenerative joint disease)   . DM type 2 (diabetes mellitus, type 2) ([place])   . Fibrocystic breast disease   . GERD (gastroesophageal reflux disease)   . History of chicken pox   . Hypercholesteremia   . Hypertension   . Lichen planopilaris   . Urinary incontinence     Past Surgical History:  Procedure Laterality Date  . BREAST SURGERY  02/1975   Left Breast Biopsy-Fibrocystic Disease  . BREAST SURGERY  10/1981   Bialteral Capsulectomy Breast Surgery  . BREAST SURGERY  07/1998   Replace Bilateral Silicone Implants  . BREAST SURGERY  01/1976   Bilater breast surgery to remove fibrocystic tissue and replace with silicone implants  . CHOLECYSTECTOMY N/A 07/08/2016   Procedure: LAPAROSCOPIC CHOLECYSTECTOMY;  Surgeon: [name], MD;  Location: [facility] ORS;  Service: General;  Laterality: N/A;  . COLONOSCOPY WITH PROPOFOL N/A 11/07/2017   Procedure: COLONOSCOPY WITH PROPOFOL;  Surgeon: Jonathon Bellows, MD;  Location: [facility] ENDOSCOPY;  Service: Gastroenterology;  Laterality: N/A;  . DILATION AND CURETTAGE OF UTERUS  01/1994  . ESOPHAGOGASTRODUODENOSCOPY (EGD) WITH PROPOFOL N/A 12/05/2016   Procedure: ESOPHAGOGASTRODUODENOSCOPY (EGD)  WITH PROPOFOL;  Surgeon: Jonathon Bellows, MD;  Location: [facility] ENDOSCOPY;  Service: Endoscopy;  Laterality: N/A;  . FINGER SURGERY  2001 & 2003   Trigger Finger  . FOOT SURGERY  1980   To Relieve Pinched Nerve  . FOOT SURGERY  07/2008   Left Foot-Plantar Fasciitis  . JOINT REPLACEMENT  04/19/2013   Hip Replacement-Right  . PLACEMENT OF BREAST IMPLANTS  12/26/2004   Replace Failed Implants  . RHINOPLASTY  03/1964   To Correct Deviated Septum    There were no vitals filed for this visit.   Subjective Assessment - 09/16/18 1051    Subjective  Patient reports she is here today to be seen by her medical team for her newly diagnosed left breast cancer.    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 The mass measures 1.4 cm in the upper outer quadrant but there are multiple smaller masses in the breast spanning 7 cm. It is Triple positive with a Ki67 of 50%.    Patient Stated Goals  Reduce lymphedema risk and learn post op shoulder ROM HEP    Currently in Pain?  Yes    Pain Score  2     Pain Location  Knee    Pain Orientation  Left    Pain Descriptors / Indicators  Aching    Pain Type  Chronic pain    Pain Onset  More  than a month ago    Pain Frequency  Intermittent    Aggravating Factors   Sit to stand and stairs    Pain Relieving Factors  rest         [facility] PT Assessment - 09/16/18 0001      Assessment   Medical Diagnosis  Left breast cancer    Referring Provider (PT)  Dr. [name]    Onset Date/Surgical Date  08/31/18    Hand Dominance  Right    Prior Therapy  none      Precautions   Precautions  Other (comment)    Precaution Comments  active cancer      Restrictions   Weight Bearing Restrictions  No      Balance Screen   Has the patient fallen in the past 6 months  No    Has the patient had a decrease in activity level because of a fear of falling?   No    Is the patient reluctant to leave  their home because of a fear of falling?   No      Home Film/video editor residence    Living Arrangements  Spouse/significant other    Available Help at Discharge  Family      Prior Function   Level of [place]  Retired    Leisure  She does not exercise      Cognition   Overall Cognitive Status  Within Functional Limits for tasks assessed      Posture/Postural Control   Posture/Postural Control  Postural limitations    Postural Limitations  Rounded Shoulders;Forward head      ROM / Strength   AROM / PROM / Strength  AROM;Strength      AROM   AROM Assessment Site  Shoulder;Cervical    Right/Left Shoulder  Right;Left    Right Shoulder Extension  44 Degrees    Right Shoulder Flexion  138 Degrees    Right Shoulder ABduction  145 Degrees    Right Shoulder Internal Rotation  77 Degrees    Right Shoulder External Rotation  65 Degrees    Left Shoulder Extension  47 Degrees    Left Shoulder Flexion  140 Degrees    Left Shoulder ABduction  150 Degrees    Left Shoulder Internal Rotation  88 Degrees    Left Shoulder External Rotation  66 Degrees    Cervical Flexion  WNL    Cervical Extension  75% limited    Cervical - Right Side Bend  75% limited    Cervical - Left Side Bend  75% limited    Cervical - Right Rotation  50% limited    Cervical - Left Rotation  50% limited      Strength   Overall Strength  Within functional limits for tasks performed        LYMPHEDEMA/ONCOLOGY QUESTIONNAIRE - 09/16/18 1055      Type   Cancer Type  Left breast cancer      Lymphedema Assessments   Lymphedema Assessments  Upper extremities      Right Upper Extremity Lymphedema   10 cm Proximal to Olecranon Process  29.4 cm    Olecranon Process  24.6 cm    10 cm Proximal to Ulnar Styloid Process  22.5 cm    Just Proximal to Ulnar Styloid Process  17.4 cm    Across Hand at [employer]  18.2 cm  At [facility] of 2nd Digit  6.5 cm       Left Upper Extremity Lymphedema   10 cm Proximal to Olecranon Process  29.2 cm    Olecranon Process  25.8 cm    10 cm Proximal to Ulnar Styloid Process  22.4 cm    Just Proximal to Ulnar Styloid Process  16.5 cm    Across Hand at [employer]  17.9 cm    At [place] of 2nd Digit  6.9 cm          Quick Dash - 09/16/18 0001    Open a tight or new jar  Mild difficulty    Do heavy household chores (wash walls, wash floors)  No difficulty    Carry a shopping bag or briefcase  Mild difficulty    Wash your back  No difficulty    Use a knife to cut food  No difficulty    Recreational activities in which you take some force or impact through your arm, shoulder, or hand (golf, hammering, tennis)  No difficulty    During the past week, to what extent has your arm, shoulder or hand problem interfered with your normal social activities with family, friends, neighbors, or groups?  Not at all    During the past week, to what extent has your arm, shoulder or hand problem limited your work or other regular daily activities  Not at all    Arm, shoulder, or hand pain.  None    Tingling (pins and needles) in your arm, shoulder, or hand  None    Difficulty Sleeping  No difficulty    DASH Score  4.55 %        Objective measurements completed on examination: See above findings.      Patient was instructed today in a home exercise program today for post op shoulder range of motion. These included active assist shoulder flexion in sitting, scapular retraction, wall walking with shoulder abduction, and hands behind head external rotation.  She was encouraged to do these twice a day, holding 3 seconds and repeating 5 times when permitted by her physician.       PT Education - 09/16/18 1056    Education Details  Lymphedema risk reduction and post op shoulder ROM HEP    Person(s) Educated  Patient;Spouse    Methods  Explanation;Demonstration;Handout    Comprehension  Returned demonstration;Verbalized  understanding           Breast Clinic Goals - 09/16/18 1143      Patient will be able to verbalize understanding of pertinent lymphedema risk reduction practices relevant to her diagnosis specifically related to skin care.   Time  1    Period  Days    Status  Achieved      Patient will be able to return demonstrate and/or verbalize understanding of the post-op home exercise program related to regaining shoulder range of motion.   Time  1    Period  Days    Status  Achieved      Patient will be able to verbalize understanding of the importance of attending the postoperative After Breast Cancer Class for further lymphedema risk reduction education and therapeutic exercise.   Time  1    Period  Days    Status  Achieved            Plan - 09/16/18 1056    Clinical Impression Statement  Patient was diagnosed on 08/31/2018 with left grade II-III invasive  ductal carcinoma breast cancer. The mass measures 1.4 cm in the upper outer quadrant but there are multiple smaller masses in the breast spanning 7 cm. It is Triple positive with a Ki67 of 50%. Her multidisciplinary medical team met prior to her assessments to determine a recommended treatment plan. She is planning to have neoadjuvant chemotherapy followed by a left mastectomy and sentinel node biopsy, radiation, and anti-estrogen therapy. She will benefit from post op PT to regain shoulder ROM.    History and Personal Factors relevant to plan of care:  none    Clinical Presentation  Stable    Clinical Decision Making  Low    Rehab Potential  Excellent    Clinical Impairments Affecting Rehab Potential  None    PT Frequency  One time visit    PT Treatment/Interventions  ADLs/Self Care Home Management;Patient/family education;Therapeutic exercise    PT Next Visit Plan  Will reassess post op if referred by MD    PT Home Exercise Plan  Post op shoulder ROM HEP    Consulted and Agree with Plan of Care  Patient;Family member/caregiver     Family Member Consulted  Husband       Patient will benefit from skilled therapeutic intervention in order to improve the following deficits and impairments:  Decreased range of motion, Impaired UE functional use, Decreased knowledge of precautions, Postural dysfunction, Pain  Visit Diagnosis: Malignant neoplasm of upper-outer quadrant of left breast in female, estrogen receptor positive ([place]) - Plan: PT plan of care cert/re-cert  Abnormal posture - Plan: PT plan of care cert/re-cert   Patient will follow up at outpatient cancer rehab 3-4 weeks following surgery.  If the patient requires physical therapy at that time, a specific plan will be dictated and sent to the referring physician for approval. The patient was educated today on appropriate basic range of motion exercises to begin post operatively and the importance of attending the After Breast Cancer class following surgery.  Patient was educated today on lymphedema risk reduction practices as it pertains to recommendations that will benefit the patient immediately following surgery.  She verbalized good understanding.      Problem List Patient Active Problem List   Diagnosis Date Noted  . Malignant neoplasm of upper-outer quadrant of left breast in female, estrogen receptor positive ([place]) 09/11/2018  . Mastitis 08/28/2018  . H/O bilateral breast implants 08/28/2018  . Family history of breast cancer in sister 08/28/2018  . Pre-op examination 02/17/2018  . Diverticular disease 10/17/2017  . Obesity (BMI 30-39.9) 09/02/2017  . Primary osteoarthritis of right knee 03/07/2017  . 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 Vitamin D deficiency 10/11/2016  . Family history of thyroid disorder 04/04/2015  . Osteopenia 10/04/2014  . Counseling regarding end of life decision making 10/04/2014  . 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 History of duodenal ulcer 08/02/2014  . Seasonal allergies 08/02/2014  .  Essential hypertension, benign 08/02/2014  . High cholesterol 08/02/2014  . 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 History of joint replacement 03/01/2014  . Diabetes mellitus without complication ([place]) 56/81/2751   Annia Friendly, PT 09/16/18 11:46 AM  [place], [place], [address] Phone: [phone]   Fax:  [phone]  Name: EZME DUCH MRN: [id number] Date of Birth: [birth year]

## 2018-09-16 NOTE — Progress Notes (Signed)
Clinical Social Work [place] Psychosocial Distress Screening [place]  Patient completed distress screening protocol and scored a 7 on the Psychosocial Distress Thermometer which indicates moderate distress. Clinical Social Worker met with patient and patients husband in [facility] to assess for distress and other psychosocial needs. Patient stated she was feeling overwhelmed but felt "better" after meeting with the treatment team and getting more information on her treatment plan. CSW and patient discussed common feeling and emotions when being diagnosed with cancer, and the importance of support during treatment. CSW informed patient of the support team and support services at [facility]. CSW provided contact information and encouraged patient to call with any questions or concerns.  ONCBCN DISTRESS SCREENING 09/16/2018  Screening Type Initial Screening  Distress experienced in past week (1-10) 7  Emotional problem type Adjusting to illness  Spiritual/Religous concerns type Facing my mortality  Information Concerns Type Lack of info about treatment  Physical Problem type Sleep/insomnia  Physician notified of physical symptoms Yes     Johnnye Lana, MSW, LCSW, OSW-C Clinical Social Worker [place] [phone]

## 2018-09-16 NOTE — Progress Notes (Signed)
REFERRING PROVIDER: [name], MD [place], [place] [address]  PRIMARY PROVIDER:  [name], MD  PRIMARY REASON FOR VISIT:  1. Malignant neoplasm of upper-outer quadrant of left breast in female, estrogen receptor positive ([place])   2. Family history of breast cancer   3. Family history of leukemia      HISTORY OF PRESENT ILLNESS:   Katie [name], a 73 y.o. female, was seen for a [facility] cancer genetics consultation at the request of Dr. Lindi Adie due to her recent diagnosis of breast cancer and family history of breast cancer. Katie [name] presents to clinic today to discuss the possibility of a hereditary predisposition to cancer, genetic testing, and to further clarify her future cancer risks, as well as potential cancer risks for family members.   In 2020, at the age of 50, Katie [name] was diagnosed with cancer of the left breast, ER+, PR+, Her2+. Her current treatment plan includes neoadjuvant chemotherapy followed by surgery and adjuvant radiation.   CANCER HISTORY:    Malignant neoplasm of upper-outer quadrant of left breast in female, estrogen receptor positive ([place])   09/14/2018 Initial Diagnosis    With prior history of breast implants that were ruptured/replaced, patient had cellulitis LIQ left breast for 1 week, and UOQ asymmetry 1.5 cm, MRI right breast biopsy benign, left breast numerous masses 7 cm skin thickening: 2 biopsies from left breast: Grade 2-3 IDC ER PR positive, HER-2 positive, Ki-67 50%    09/16/2018 Cancer Staging    Staging form: Breast, AJCC 8th Edition - Clinical stage from 09/16/2018: Stage IIIB (cT4, cN0, cM0, G3, ER+, PR+, HER2+) - Signed by [name], MD on 09/16/2018    09/24/2018 -  Chemotherapy    The patient had palonosetron (ALOXI) injection 0.25 mg, 0.25 mg, Intravenous,  Once, 0 of 6 cycles pegfilgrastim-cbqv (UDENYCA) injection 6 mg, 6 mg, Subcutaneous, Once, 0 of 6 cycles trastuzumab (HERCEPTIN) 609 mg in sodium  chloride 0.9 % 250 mL chemo infusion, 8 mg/kg = 609 mg, Intravenous,  Once, 0 of 6 cycles CARBOplatin (PARAPLATIN) 490 mg in sodium chloride 0.9 % 250 mL chemo infusion, 490 mg (100 % of original dose 493.2 mg), Intravenous,  Once, 0 of 6 cycles Dose modification:   (original dose 493.2 mg, Cycle 1) DOCEtaxel (TAXOTERE) 140 mg in sodium chloride 0.9 % 250 mL chemo infusion, 75 mg/m2 = 140 mg, Intravenous,  Once, 0 of 6 cycles pertuzumab (PERJETA) 420 mg in sodium chloride 0.9 % 250 mL chemo infusion, 420 mg (100 % of original dose 420 mg), Intravenous, Once, 0 of 6 cycles Dose modification: 420 mg (original dose 420 mg, Cycle 1, Reason: Provider Judgment) fosaprepitant (EMEND) 150 mg, dexamethasone (DECADRON) 12 mg in sodium chloride 0.9 % 145 mL IVPB, , Intravenous,  Once, 0 of 6 cycles  for chemotherapy treatment.       HORMONAL RISK FACTORS:  Menarche was at age 1.  First live birth at age: 51.  OCP use for approximately 10 months. Ovaries intact: yes.  Hysterectomy: no.  Menopausal status: postmenopausal.  HRT use: 0 years. Colonoscopy: yes; she reports a few polyps, due back in 5 years. Mammogram within the last year: yes. Number of breast biopsies: "many".  Past Medical History:  Diagnosis Date  . Allergy   . Anemia   . Blood transfusion without reported diagnosis   . Diverticulitis   . DJD (degenerative joint disease)   . DM type 2 (diabetes mellitus, type 2) ([place])   .  Family history of breast cancer   . Fibrocystic breast disease   . GERD (gastroesophageal reflux disease)   . History of chicken pox   . Hypercholesteremia   . Hypertension   . Lichen planopilaris   . Urinary incontinence     Past Surgical History:  Procedure Laterality Date  . BREAST SURGERY  02/1975   Left Breast Biopsy-Fibrocystic Disease  . BREAST SURGERY  10/1981   Bialteral Capsulectomy Breast Surgery  . BREAST SURGERY  07/1998   Replace Bilateral Silicone Implants  . BREAST SURGERY   01/1976   Bilater breast surgery to remove fibrocystic tissue and replace with silicone implants  . CHOLECYSTECTOMY N/A 07/08/2016   Procedure: LAPAROSCOPIC CHOLECYSTECTOMY;  Surgeon: [name], MD;  Location: [facility] ORS;  Service: General;  Laterality: N/A;  . COLONOSCOPY WITH PROPOFOL N/A 11/07/2017   Procedure: COLONOSCOPY WITH PROPOFOL;  Surgeon: Jonathon Bellows, MD;  Location: [facility] ENDOSCOPY;  Service: Gastroenterology;  Laterality: N/A;  . DILATION AND CURETTAGE OF UTERUS  01/1994  . ESOPHAGOGASTRODUODENOSCOPY (EGD) WITH PROPOFOL N/A 12/05/2016   Procedure: ESOPHAGOGASTRODUODENOSCOPY (EGD) WITH PROPOFOL;  Surgeon: Jonathon Bellows, MD;  Location: [facility] ENDOSCOPY;  Service: Endoscopy;  Laterality: N/A;  . FINGER SURGERY  2001 & 2003   Trigger Finger  . FOOT SURGERY  1980   To Relieve Pinched Nerve  . FOOT SURGERY  07/2008   Left Foot-Plantar Fasciitis  . JOINT REPLACEMENT  04/19/2013   Hip Replacement-Right  . PLACEMENT OF BREAST IMPLANTS  12/26/2004   Replace Failed Implants  . RHINOPLASTY  03/1964   To Correct Deviated Septum    Social History   Socioeconomic History  . Marital status: Married    Spouse name: Not on file  . Number of children: 1  . Years of education: Not on file  . Highest education level: Not on file  Occupational History  . Occupation: Retired  Scientific laboratory technician  . Financial resource strain: Not on file  . Food insecurity:    Worry: Not on file    Inability: Not on file  . Transportation needs:    Medical: Not on file    Non-medical: Not on file  Tobacco Use  . Smoking status: Never Smoker  . Smokeless tobacco: Never Used  Substance and Sexual Activity  . Alcohol use: No    Alcohol/week: 0.0 standard drinks  . Drug use: No  . Sexual activity: Never    Partners: Male  Lifestyle  . Physical activity:    Days per week: Not on file    Minutes per session: Not on file  . Stress: Not on file  Relationships  . Social connections:    Talks on phone: Not on  file    Gets together: Not on file    Attends religious service: Not on file    Active member of club or organization: Not on file    Attends meetings of clubs or organizations: Not on file    Relationship status: Not on file  Other Topics Concern  . Not on file  Social History Narrative   Married for 37 years.    She has one child and one stepston.        FAMILY HISTORY:  We obtained a detailed, 4-generation family history.  Significant diagnoses are listed below: Family History  Problem Relation Age of Onset  . Arthritis Mother   . Hypertension Mother   . Heart disease Father   . Diabetes Father   . Breast cancer Sister 68  d. 44  . Hypertension Brother   . Leukemia Brother   . Coronary artery disease Brother     Katie [name] has one son, age 48. She had two sisters and two brothers. One of her sisters was diagnosed with breast cancer at 64 and died at 74. One of her brothers was diagnosed with leukemia at 27 and is living at 77. No cancers for her nieces/nephews.  Katie [name] mother died at 49 and had 54 brothers and sisters. No cancers for these maternal aunts/uncles or her cousins, although she says she has limited information about them. She is unsure of how old her maternal grandfather was when he died. Her grandmother died in her 108s.  Katie [name] father died at 67 and had 36 brothers and sisters. No cancers for these paternal aunts/uncles or for her paternal cousins, although she also reports limited information about these individuals. Her paternal grandmother and grandfather died in their 39s.   Katie [name] is unaware of previous family history of genetic testing for hereditary cancer risks. Patient's ancestors are of [place] and [place] European descent. There is no reported Ashkenazi Jewish ancestry. There is no known consanguinity.  GENETIC COUNSELING ASSESSMENT: Katie [name] is a 73 y.o. female with a personal and family history of breast cancer which is  somewhat suggestive of a Hereditary Cancer Predisposition Syndrome. We, therefore, discussed and recommended the following at today's visit.   DISCUSSION: We discussed that about 5-10% of breast cancer cases are hereditary with most cases due to BRCA mutations. We reviewed the characteristics, features and inheritance patterns of hereditary cancer syndromes. We also discussed genetic testing, including the appropriate family members to test, the process of testing, insurance coverage and turn-around-time for results. We discussed the implications of a negative, positive and/or variant of uncertain significant result. We recommended Katie [name] pursue genetic testing for the [facility] Common Hereditary Cancers Panel.  The Common Hereditary Cancers Panel offered by [facility] includes sequencing and/or deletion duplication testing of the following 47 genes: APC, ATM, AXIN2, BARD1, BMPR1A, BRCA1, BRCA2, BRIP1, CDH1, CDKN2A (p14ARF), CDKN2A (p16INK4a), CKD4, CHEK2, CTNNA1, DICER1, EPCAM (Deletion/duplication testing only), GREM1 (promoter region deletion/duplication testing only), KIT, MEN1, MLH1, MSH2, MSH3, MSH6, MUTYH, NBN, NF1, NHTL1, PALB2, PDGFRA, PMS2, POLD1, POLE, PTEN, RAD50, RAD51C, RAD51D, SDHB, SDHC, SDHD, SMAD4, SMARCA4. STK11, TP53, TSC1, TSC2, and VHL.  The following genes were evaluated for sequence changes only: SDHA and HOXB13 c.251G>A variant only.  We discussed that if she is found to have a mutation in one of these genes, it may impact surgical decisions, and alter future medical management recommendations such as increased cancer screenings and consideration of risk reducing surgeries.  A positive result could also have implications for the patient's family members.  A Negative result would mean we were unable to identify a hereditary component to her cancer, but does not rule out the possibility of a hereditary basis for her cancer.  There could be mutations that are undetectable by current  technology, or in genes not yet tested or identified to increase cancer risk.    We discussed the potential to find a Variant of Uncertain Significance or VUS.  These are variants that have not yet been identified as pathogenic or benign, and it is unknown if this variant is associated with increased cancer risk or if this is a normal finding.  Most VUS's are reclassified to benign or likely benign.   It should not be used to make medical management decisions. With time,  we suspect the lab will determine the significance of any VUS's identified if any.   Based on Katie [name]'s personal and family history of cancer, she meets NCCN medical criteria for genetic testing. Despite that she meets criteria, she may still have an out of pocket cost. The lab will notify her of an OOP if any.  PLAN: After considering the risks, benefits, and limitations, Katie [name]  provided informed consent to pursue genetic testing and the blood sample was sent to [facility] for analysis of the Common Hereditary Cancers Panel. Results should be available within approximately 2-3 weeks' time, at which point they will be disclosed by telephone to Katie [name], as will any additional recommendations warranted by these results. Katie [name] will receive a summary of her genetic counseling visit and a copy of her results once available. This information will also be available in Epic.   Based on Katie [name]'s family history, we recommended her other siblings and the children of her sister who died from breast cancer, have genetic counseling and testing. Katie [name] will let us know if we can be of any assistance in coordinating genetic counseling and/or testing for these family members.  Lastly, we encouraged Katie [name] to remain in contact with cancer genetics annually so that we can continuously update the family history and inform her of any changes in cancer genetics and testing that may be of benefit for this family.   Ms.   [name] questions were answered to her satisfaction today. Our contact information was provided should additional questions or concerns arise. Thank you for the referral and allowing [place] to share in the care of your patient.   Faith Rogue, MS Genetic Counselor [email or web address] Phone: [phone]   The patient was seen for a total of 15 minutes in face-to-face genetic counseling.  The patient was accompanied today by her husband Katie [name]. This patient was discussed with Drs. Magrinat, Lindi Adie and/or Burr Medico who agrees with the above.

## 2018-09-16 NOTE — Patient Instructions (Signed)

## 2018-09-16 NOTE — Progress Notes (Signed)
Radiation Oncology         [phone] ________________________________  Initial outpatient Consultation  Name: MACKINZIE VUNCANNON MRN: [id number]  Date: 09/16/2018  DOB: 1945-09-03  VE:HMCNOBS, Mervyn Gay, MD  [name], MD   REFERRING PHYSICIAN: [name], MD  DIAGNOSIS:    ICD-10-CM   1. Malignant neoplasm of upper-outer quadrant of left breast in female, estrogen receptor positive ([place]) C50.412    Z17.0    Cancer Staging Malignant neoplasm of upper-outer quadrant of left breast in female, estrogen receptor positive ([place]) Staging form: Breast, AJCC 8th Edition - Clinical stage from 09/16/2018: Stage IIIB (cT4, cN0, cM0, G3, ER+, PR+, HER2+) - Signed by [name], MD on 09/16/2018  CHIEF COMPLAINT: Here to discuss management of left breast cancer  HISTORY OF PRESENT ILLNESS::Florella SAYDE LISH is a 73 y.o. female who reports that many years ago she underwent subcutaneous bilateral mastectomies at an outside institution for risk reduction.  She has a significant family history.  She has silicone implants that have ruptured in the past but have been relatively stable for over a decade.  Recently she presented with left breast redness and pain.  Her imaging including mammogram, ultrasound, and breast MRIs have been reviewed at our tumor board as of this morning.  I personally reviewed her images.  Mammogram revealed thickened skin in the left breast and ultrasound revealed multiple masses in the left breast.  MRI revealed multiple masses in the left breast but no abnormal lymph nodes were appreciated.  The span of disease in the left breast extended over at least 7 cm.  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  There was a right subareolar mass, right breast, found on MRI however the biopsy of this area was negative.  Consensus at tumor board is that patient will  need mastectomy.  She will be referred for genetic testing.  Patient is open minded about possibly undergoing bilateral mastectomies.  She is not particularly interested in reconstruction  Review of systems is positive for wearing glasses, ringing in ears, incontinence, breast lump, skin rash over breast, arthritis, diabetes, scalp irritation   PAST MEDICAL HISTORY:  has a past medical history of Allergy, Anemia, Blood transfusion without reported diagnosis, Diverticulitis, DJD (degenerative joint disease), DM type 2 (diabetes mellitus, type 2) ([place]), Family history of breast cancer, Fibrocystic breast disease, GERD (gastroesophageal reflux disease), History of chicken pox, Hypercholesteremia, Hypertension, Lichen planopilaris, and Urinary incontinence.    PAST SURGICAL HISTORY: Past Surgical History:  Procedure Laterality Date  . BREAST SURGERY  02/1975   Left Breast Biopsy-Fibrocystic Disease  . BREAST SURGERY  10/1981   Bialteral Capsulectomy Breast Surgery  . BREAST SURGERY  07/1998   Replace Bilateral Silicone Implants  . BREAST SURGERY  01/1976   Bilater breast surgery to remove fibrocystic tissue and replace with silicone implants  . CHOLECYSTECTOMY N/A 07/08/2016   Procedure: LAPAROSCOPIC CHOLECYSTECTOMY;  Surgeon: [name], MD;  Location: [facility] ORS;  Service: General;  Laterality: N/A;  . COLONOSCOPY WITH PROPOFOL N/A 11/07/2017   Procedure: COLONOSCOPY WITH PROPOFOL;  Surgeon: Jonathon Bellows, MD;  Location: [facility] ENDOSCOPY;  Service: Gastroenterology;  Laterality: N/A;  . DILATION AND CURETTAGE OF UTERUS  01/1994  . ESOPHAGOGASTRODUODENOSCOPY (EGD) WITH PROPOFOL N/A 12/05/2016   Procedure: ESOPHAGOGASTRODUODENOSCOPY (EGD) WITH PROPOFOL;  Surgeon: Jonathon Bellows, MD;  Location: [facility] ENDOSCOPY;  Service: Endoscopy;  Laterality: N/A;  .  FINGER SURGERY  2001 & 2003   Trigger Finger  . FOOT SURGERY  1980   To Relieve Pinched Nerve  . FOOT SURGERY  07/2008   Left Foot-Plantar Fasciitis    . JOINT REPLACEMENT  04/19/2013   Hip Replacement-Right  . PLACEMENT OF BREAST IMPLANTS  12/26/2004   Replace Failed Implants  . RHINOPLASTY  03/1964   To Correct Deviated Septum    FAMILY HISTORY: family history includes Arthritis in her mother; Breast cancer (age of onset: 4) in her sister; Coronary artery disease in her brother; Diabetes in her father; Heart disease in her father; Hypertension in her brother and mother; Leukemia in her brother.  SOCIAL HISTORY:  reports that she has never smoked. She has never used smokeless tobacco. She reports that she does not drink alcohol or use drugs.  ALLERGIES: Erythromycin; Ciprofloxacin; Daypro [oxaprozin]; Enalapril maleate; Nsaids; and Vioxx [rofecoxib]  MEDICATIONS:  Current Outpatient Medications  Medication Sig Dispense Refill  . atorvastatin (LIPITOR) 10 MG tablet Take 1 tablet (10 mg total) by mouth daily. 90 tablet 3  . Azelastine HCl 137 MCG/SPRAY SOLN instill 1 spray into each nostril twice a day for 1 week then if needed 30 mL 5  . Calcium-Vitamin D-Vitamin K (VIACTIV) [phone] MG-UNT-MCG CHEW Chew 1 each by mouth daily.    . cholecalciferol (VITAMIN D) 400 units TABS tablet Take 400 Units by mouth 2 (two) times daily.    . clobetasol (TEMOVATE) 0.05 % external solution Apply 1 application topically. Reported on 08/16/2015    . fluticasone (FLONASE) 50 MCG/ACT nasal spray instill 2 sprays into each nostril once daily 16 g 5  . glucose blood (FREESTYLE LITE) test strip Use to check blood sugar daily.  Dx: E11.9 50 each 12  . hydrochlorothiazide (HYDRODIURIL) 25 MG tablet Take 1 tablet (25 mg total) by mouth daily. 90 tablet 3  . lidocaine-prilocaine (EMLA) cream Apply to affected area once 30 g 3  . loratadine (CLARITIN) 10 MG tablet Take 10 mg by mouth daily.    [name] LORazepam (ATIVAN) 0.5 MG tablet Take 1 tablet (0.5 mg total) by mouth at bedtime as needed for sleep. 30 tablet 0  . losartan (COZAAR) 100 MG tablet Take 1 tablet  (100 mg total) by mouth daily. 90 tablet 3  . metFORMIN (GLUCOPHAGE) 500 MG tablet Take 1 tablet (500 mg total) by mouth daily. 90 tablet 3  . metroNIDAZOLE (METROCREAM) 0.75 % cream Apply 1 application topically 2 (two) times daily as needed.    . ondansetron (ZOFRAN) 8 MG tablet Take 1 tablet (8 mg total) by mouth 2 (two) times daily as needed (Nausea or vomiting). Begin 4 days after chemotherapy. 30 tablet 1  . prochlorperazine (COMPAZINE) 10 MG tablet Take 1 tablet (10 mg total) by mouth every 6 (six) hours as needed (Nausea or vomiting). 30 tablet 1  . triamcinolone cream (KENALOG) 0.1 % Apply 1 application topically 2 (two) times daily. 15 g 0  . valACYclovir (VALTREX) 500 MG tablet Take 1 tablet (500 mg total) by mouth 2 (two) times daily. (Patient taking differently: Take 500 mg by mouth as needed. ) 6 tablet 2   No current facility-administered medications for this encounter.     REVIEW OF SYSTEMS: A 10+ POINT REVIEW OF SYSTEMS WAS OBTAINED including neurology, dermatology, psychiatry, cardiac, respiratory, lymph, extremities, GI, GU, Musculoskeletal, constitutional, breasts, reproductive, HEENT.  All pertinent positives are noted in the HPI.  All others are negative.   PHYSICAL EXAM:  Vitals  with BMI 09/16/2018  Height '5\' 2"'$   Weight 167 lbs 5 oz  BMI 40.10  Systolic 272  Diastolic 72  Pulse 82  Respirations 18   General: Alert and oriented, in no acute distress HEENT: Head is normocephalic. Extraocular movements are intact. Oropharynx is clear. Neck: Neck is supple, no palpable cervical or supraclavicular lymphadenopathy. Heart: Regular in rate and rhythm with no murmurs, rubs, or gallops. Chest: Clear to auscultation bilaterally, with no rhonchi, wheezes, or rales. Abdomen: Soft, nontender, nondistended, with no rigidity or guarding. Extremities: No cyanosis or edema. Lymphatics: see Neck Exam Skin: No concerning lesions. Musculoskeletal: symmetric strength and muscle tone  throughout. Neurologic: Cranial nerves II through XII are grossly intact. No obvious focalities. Speech is fluent. Coordination is intact. Psychiatric: Judgment and insight are intact. Affect is appropriate. Breasts: She has heterogeneous tissue in her breasts bilaterally; the left breast is particularly lumpy from 2:00 to 8:00.  There is moderate erythema and skin thickening particularly in the lower left breast. No other palpable masses appreciated in the breasts or axillae bilaterally   ECOG = 0  0 - Asymptomatic (Fully active, able to carry on all predisease activities without restriction)  1 - Symptomatic but completely ambulatory (Restricted in physically strenuous activity but ambulatory and able to carry out work of a light or sedentary nature. For example, light housework, office work)  2 - Symptomatic, <50% in bed during the day (Ambulatory and capable of all self care but unable to carry out any work activities. Up and about more than 50% of waking hours)  3 - Symptomatic, >50% in bed, but not bedbound (Capable of only limited self-care, confined to bed or chair 50% or more of waking hours)  4 - Bedbound (Completely disabled. Cannot carry on any self-care. Totally confined to bed or chair)  5 - Death   Eustace Pen MM, Creech RH, Tormey DC, et al. [phone]). "Toxicity and response criteria of the [facility]". [name] Oncol. 5 (6): 649-55   LABORATORY DATA:  Lab Results  Component Value Date   WBC 4.4 09/16/2018   HGB 12.3 09/16/2018   HCT 35.1 (L) 09/16/2018   MCV 97.5 09/16/2018   PLT 210 09/16/2018   CMP     Component Value Date/Time   NA 140 09/16/2018 0819   K 3.8 09/16/2018 0819   CL 103 09/16/2018 0819   CO2 25 09/16/2018 0819   GLUCOSE 162 (H) 09/16/2018 0819   BUN 24 (H) 09/16/2018 0819   CREATININE 1.05 (H) 09/16/2018 0819   CALCIUM 10.5 (H) 09/16/2018 0819   PROT 7.6 09/16/2018 0819   ALBUMIN 4.1 09/16/2018 0819   AST 17 09/16/2018  0819   ALT 23 09/16/2018 0819   ALKPHOS 72 09/16/2018 0819   BILITOT 0.7 09/16/2018 0819   GFRNONAA 53 (L) 09/16/2018 0819   GFRAA >60 09/16/2018 0819         RADIOGRAPHY: Mr Breast Bilateral W Wo Contrast Inc Cad  Addendum Date: 09/15/2018   ADDENDUM REPORT: 09/15/2018 15:42 ADDENDUM: There is mild diffuse LEFT breast skin thickening and enhancement which is indeterminate. This may represent known cellulitis, but consider skin punch biopsy as clinically indicated. Electronically Signed   By: Margarette Canada M.D.   On: 09/15/2018 15:42   Result Date: 09/15/2018 CLINICAL DATA:  73 year old with newly diagnosed malignancy within the UPPER OUTER LEFT breast. LABS:  Creatinine was obtained on site at [facility] at [address] Results: Creatinine-0.9.  GFR-63. EXAM:  BILATERAL BREAST MRI WITH AND WITHOUT CONTRAST TECHNIQUE: Multiplanar, multisequence MR images of both breasts were obtained prior to and following the intravenous administration of 7 ml of Gadavist Three-dimensional MR images were rendered by post-processing of the original MR data on an independent workstation. The three-dimensional MR images were interpreted, and findings are reported in the following complete MRI report for this study. Three dimensional images were evaluated at the independent DynaCad workstation COMPARISON:  Prior mammograms and ultrasounds from [place] FINDINGS: Breast composition: b. Scattered fibroglandular tissue. Background parenchymal enhancement: Mild Right breast: A retropectoral implant is noted. A 5 mm focal area of enhancement directly behind the nipple is noted. This may represent non malignant enhancement of the nipple areolar complex, but nonspecific. No other suspicious areas are identified. Left breast: A retropectoral implant is noted. Multiple enhancing nodules/irregular masses throughout all 4 quadrants of the central LEFT breast and within the LOWER LEFT breast are identified. The largest mass  measures 1.4 cm within the UPPER-OUTER LEFT breast containing biopsy clip artifact and compatible with recent biopsy-proven malignancy (series 7: Image 97). Biopsy-proven malignancy and highly suspicious nodule/irregular masses encompass an area measuring at least 7 x 4 x 5 cm (transverse x AP x CC). Lymph nodes: No abnormal appearing lymph nodes. An intraparenchymal lymph node in the UPPER OUTER LEFT breast has normal cortical thickness. Ancillary findings:  None. IMPRESSION: 1. Multiple enhancing nodule/irregular masses throughout the LEFT breast, encompassing an area measuring at least 7 x 4 x 5 cm, which includes the biopsy-proven malignancy within the UPPER OUTER LEFT breast. 2. Indeterminate 5 mm enhancing area directly behind the RIGHT nipple. Although this could represent enhancement can be seen with normal nipple areolar complex, 2nd-look ultrasound is recommended for further evaluation. 3. No suspicious lymph nodes identified. RECOMMENDATION: 1. 2nd-look RIGHT breast ultrasound for indeterminate 5 mm enhancing area directly behind the RIGHT nipple. 2. Consider tissue sampling of more removed highly suspicious nodules/masses within the LEFT breast if disease extent would alter treatment. BI-RADS CATEGORY  5: Highly suggestive of malignancy. Electronically Signed: By: Margarette Canada M.D. On: 09/09/2018 11:02      IMPRESSION/PLAN: Locally advanced left breast cancer  Patient agrees to genetic counseling  She is agreeable with mastectomy and would consider bilateral mastectomies depending on genetic results; she might be interested in bilateral mastectomies even if genetics is negative.  She is not particularly interested in reconstruction.  She understands that the consensus on her team is that she undergo neoadjuvant chemotherapy.  She is agreeable to that.  It was a pleasure meeting the patient today. We discussed the risks, benefits, and side effects of radiotherapy. I recommend radiotherapy to the  left chest wall and regional nodes to reduce her risk of locoregional recurrence by 2/3.  We discussed that radiation would take approximately 6-7 weeks to complete and that I would give the patient a few weeks to heal following surgery before starting treatment planning. We spoke about acute effects including skin irritation and fatigue as well as much less common late effects including internal organ injury or irritation. We spoke about the latest technology that is used to minimize the risk of late effects for patients undergoing radiotherapy to the breast or chest wall. No guarantees of treatment were given. The patient is enthusiastic about proceeding with treatment. I look forward to participating in the patient's care.  I will await her referral back to me for postoperative follow-up and eventual CT simulation/treatment planning.   __________________________________________   [name],  MD

## 2018-09-16 NOTE — Progress Notes (Signed)
Nutrition Assessment  Reason for Assessment:  Pt seen in Breast Clinic  ASSESSMENT:  73 year female with new diagnosis of breast cancer.  Past medical history of DM, HTN, osteopenia.    Patient reports normal appetite.  Medications: reviewed  Labs: reviewed   Anthropometrics:   Height: 62 inches Weight: 167 lb 4.8 oz BMI: 30   NUTRITION DIAGNOSIS: Food and nutrition related knowledge deficit related to new diagnosis of breast cancer as evidenced by no prior need for nutrition related information.  INTERVENTION:   Discussed and provided packet of information regarding nutritional tips for breast cancer patients.  Questions answered.  Teachback method used.  Contact information provided and patient knows to contact me with questions/concerns.    MONITORING, EVALUATION, and GOAL: Pt will consume a healthy plant based diet to maintain lean body mass throughout treatment.   Vahe Pienta B. Zenia Resides, [place], [place] Registered Dietitian [phone] (pager)

## 2018-09-16 NOTE — Assessment & Plan Note (Signed)
With prior history of breast implants that were ruptured/replaced, patient had cellulitis LIQ left breast for 1 week, and UOQ asymmetry 1.5 cm, MRI right breast biopsy benign, left breast numerous masses 7 cm skin thickening: 2 biopsies from left breast: Grade 2-3 IDC ER PR positive, HER-2 positive, Ki-67 50% Clinical stage: T4 N0 M0 stage IIIb  Pathology and radiology counseling: Discussed with the patient, the details of pathology including the type of breast cancer,the clinical staging, the significance of ER, PR and HER-2/neu receptors and the implications for treatment. After reviewing the pathology in detail, we proceeded to discuss the different treatment options between surgery, radiation, chemotherapy, antiestrogen therapies.  Recommendation based on multidisciplinary tumor board: 1. Neoadjuvant chemotherapy with TCH Perjeta 6 cycles followed by Herceptin maintenance for 1 year 2. Followed by mastectomy with sentinel lymph node study 3. Followed by adjuvant radiation therapy   Chemotherapy Counseling: I discussed the risks and benefits of chemotherapy including the risks of nausea/ vomiting, risk of infection from low WBC count, fatigue due to chemo or anemia, bruising or bleeding due to low platelets, mouth sores, loss/ change in taste and decreased appetite. Liver and kidney function will be monitored through out chemotherapy as abnormalities in liver and kidney function may be a side effect of treatment. Cardiac dysfunction due to Herceptin and Perjeta were discussed in detail. Risk of permanent bone marrow dysfunction due to chemo were also discussed.  Plan: 1. Port placement and skin biopsy at the same time 2. Echocardiogram 3. Chemotherapy class 4. Breast MRI 5. CT chest abdomen pelvis and bone scan for staging  Return to clinic in 2 weeks to start chemotherapy.

## 2018-09-17 ENCOUNTER — Telehealth: Payer: Self-pay | Admitting: Hematology and Oncology

## 2018-09-17 NOTE — Telephone Encounter (Signed)
Per 1/22 no los °

## 2018-09-18 ENCOUNTER — Encounter: Payer: Self-pay | Admitting: Radiation Oncology

## 2018-09-21 ENCOUNTER — Other Ambulatory Visit: Payer: Self-pay | Admitting: General Surgery

## 2018-09-21 DIAGNOSIS — C50812 Malignant neoplasm of overlapping sites of left female breast: Secondary | ICD-10-CM | POA: Diagnosis not present

## 2018-09-21 DIAGNOSIS — L309 Dermatitis, unspecified: Secondary | ICD-10-CM | POA: Diagnosis not present

## 2018-09-23 ENCOUNTER — Telehealth: Payer: Self-pay | Admitting: Licensed Clinical Social Worker

## 2018-09-23 ENCOUNTER — Encounter: Payer: Self-pay | Admitting: Licensed Clinical Social Worker

## 2018-09-23 ENCOUNTER — Ambulatory Visit: Payer: Self-pay | Admitting: Licensed Clinical Social Worker

## 2018-09-23 DIAGNOSIS — Z1379 Encounter for other screening for genetic and chromosomal anomalies: Secondary | ICD-10-CM

## 2018-09-23 NOTE — Telephone Encounter (Signed)
Revealed negative genetic testing. This normal result is reassuring and indicates that it is unlikely Katie [name]'s cancer is due to a hereditary cause.  It is unlikely that there is an increased risk of another cancer due to a mutation in one of these genes.  However, genetic testing is not perfect, and cannot definitively rule out a hereditary cause.  It will be important for her to keep in contact with genetics to learn if any additional testing may be needed in the future.  Her siblings and sister's children could have genetic counseling and testing, she will let me know if I can assist in seeing any of them if they are interested.

## 2018-09-23 NOTE — Progress Notes (Signed)
HPI:  Ms. Ilyas was previously seen in the [facility] on 09/16/2018 due her recent diagnosis of breast cancer, family history of cancer, and concerns regarding a hereditary predisposition to cancer. Please refer to our prior cancer genetics clinic note for more information regarding Ms. Doggett's medical, social and family histories, and our assessment and recommendations, at the time. Ms. Reino recent genetic test results were disclosed to her, as well as recommendations warranted by these results. These results and recommendations are discussed in more detail below.  CANCER HISTORY:    Malignant neoplasm of upper-outer quadrant of left breast in female, estrogen receptor positive ([place])   09/14/2018 Initial Diagnosis    With prior history of breast implants that were ruptured/replaced, patient had cellulitis LIQ left breast for 1 week, and UOQ asymmetry 1.5 cm, MRI right breast biopsy benign, left breast numerous masses 7 cm skin thickening: 2 biopsies from left breast: Grade 2-3 IDC ER PR positive, HER-2 positive, Ki-67 50%    09/16/2018 Cancer Staging    Staging form: Breast, AJCC 8th Edition - Clinical stage from 09/16/2018: Stage IIIB (cT4, cN0, cM0, G3, ER+, PR+, HER2+) - Signed by [name], MD on 09/16/2018    09/24/2018 -  Chemotherapy    The patient had palonosetron (ALOXI) injection 0.25 mg, 0.25 mg, Intravenous,  Once, 0 of 6 cycles pegfilgrastim-cbqv (UDENYCA) injection 6 mg, 6 mg, Subcutaneous, Once, 0 of 6 cycles trastuzumab (HERCEPTIN) 609 mg in sodium chloride 0.9 % 250 mL chemo infusion, 8 mg/kg = 609 mg, Intravenous,  Once, 0 of 6 cycles CARBOplatin (PARAPLATIN) 490 mg in sodium chloride 0.9 % 250 mL chemo infusion, 490 mg (100 % of original dose 493.2 mg), Intravenous,  Once, 0 of 6 cycles Dose modification:   (original dose 493.2 mg, Cycle 1) DOCEtaxel (TAXOTERE) 140 mg in sodium chloride 0.9 % 250 mL chemo infusion, 75 mg/m2 = 140 mg, Intravenous,   Once, 0 of 6 cycles pertuzumab (PERJETA) 420 mg in sodium chloride 0.9 % 250 mL chemo infusion, 420 mg (100 % of original dose 420 mg), Intravenous, Once, 0 of 6 cycles Dose modification: 420 mg (original dose 420 mg, Cycle 1, Reason: Provider Judgment) fosaprepitant (EMEND) 150 mg, dexamethasone (DECADRON) 12 mg in sodium chloride 0.9 % 145 mL IVPB, , Intravenous,  Once, 0 of 6 cycles  for chemotherapy treatment.       FAMILY HISTORY:  We obtained a detailed, 4-generation family history.  Significant diagnoses are listed below: Family History  Problem Relation Age of Onset  . Arthritis Mother   . Hypertension Mother   . Heart disease Father   . Diabetes Father   . Breast cancer Sister 32       d. 29  . Hypertension Brother   . Leukemia Brother   . Coronary artery disease Brother     Ms. Sachs has one son, age 50. She had two sisters and two brothers. One of her sisters was diagnosed with breast cancer at 95 and died at 32. One of her brothers was diagnosed with leukemia at 61 and is living at 65. No cancers for her nieces/nephews.  Ms. Ferrebee mother died at 68 and had 33 brothers and sisters. No cancers for these maternal aunts/uncles or her cousins, although she says she has limited information about them. She is unsure of how old her maternal grandfather was when he died. Her grandmother died in her 66s.  Ms. Cohea father died at 31 and had 38  brothers and sisters. No cancers for these paternal aunts/uncles or for her paternal cousins, although she also reports limited information about these individuals. Her paternal grandmother and grandfather died in their 8s.   Ms. Brossart is unaware of previous family history of genetic testing for hereditary cancer risks. Patient's ancestors are of [place] and [place] European descent. There is no reported Ashkenazi Jewish ancestry. There is no known consanguinity.  GENETIC TEST RESULTS: Genetic testing performed through [facility]'s  Common Hereditary Cancers Panel reported out on 09/23/2018 showed no pathogenic mutations.   The Common Hereditary Cancers Panel offered by [facility] includes sequencing and/or deletion duplication testing of the following 47 genes: APC, ATM, AXIN2, BARD1, BMPR1A, BRCA1, BRCA2, BRIP1, CDH1, CDKN2A (p14ARF), CDKN2A (p16INK4a), CKD4, CHEK2, CTNNA1, DICER1, EPCAM (Deletion/duplication testing only), GREM1 (promoter region deletion/duplication testing only), KIT, MEN1, MLH1, MSH2, MSH3, MSH6, MUTYH, NBN, NF1, NHTL1, PALB2, PDGFRA, PMS2, POLD1, POLE, PTEN, RAD50, RAD51C, RAD51D, SDHB, SDHC, SDHD, SMAD4, SMARCA4. STK11, TP53, TSC1, TSC2, and VHL.  The following genes were evaluated for sequence changes only: SDHA and HOXB13 c.251G>A variant only.  The test report will be scanned into EPIC and will be located under the Molecular Pathology section of the Results Review tab. A portion of the result report is included below for reference.     We discussed with Ms. Farabaugh that because current genetic testing is not perfect, it is possible there may be a gene mutation in one of these genes that current testing cannot detect, but that chance is small.  We also discussed, that there could be another gene that has not yet been discovered, or that we have not yet tested, that is responsible for the cancer diagnoses in the family. It is also possible there is a hereditary cause for the cancer in the family that Ms. Lawhorn did not inherit and therefore was not identified in her testing.  Therefore, it is important to remain in touch with cancer genetics in the future so that we can continue to offer Ms. Stitt the most up to date genetic testing.   ADDITIONAL GENETIC TESTING: We discussed with Ms. Gruenhagen that her genetic testing was fairly extensive.  If there are are genes identified to increase cancer risk that can be analyzed in the future, we would be happy to discuss and coordinate this testing at that time.    CANCER  SCREENING RECOMMENDATIONS: Ms. Jacobs's test result is considered negative (normal).  This means that we have not identified a hereditary cause for her personal and family history of cancer at this time.   This result indicates that it is unlikely Ms. Campion has an increased risk for a future cancer due to a mutation in one of these genes. This normal test also suggests that Ms. Bonenberger's cancer was most likely not due to an inherited predisposition associated with one of these genes.  Most cancers happen by chance and this negative test suggests that her cancer may fall into this category.   While reassuring, this does not definitively rule out a hereditary predisposition to cancer. It is still possible that there could be genetic mutations that are undetectable by current technology, or genetic mutations in genes that have not been tested or identified to increase cancer risk.  Therefore, it is recommended she continue to follow the cancer management and screening guidelines provided by her oncology and primary healthcare provider. An individual's cancer risk is not determined by genetic test results alone.  Overall cancer risk assessment includes additional factors  such as personal medical history, family history, etc.  These should be used to make a personalized plan for cancer prevention and surveillance.    RECOMMENDATIONS FOR FAMILY MEMBERS:  Relatives in this family might be at some increased risk of developing cancer, over the general population risk, simply due to the family history of cancer.  We recommended women in this family have a yearly mammogram beginning at age 44, or 83 years younger than the earliest onset of cancer, an annual clinical breast exam, and perform monthly breast self-exams. Women in this family should also have a gynecological exam as recommended by their primary provider. All family members should have a colonoscopy as directed by their doctors.  All family members should  inform their physicians about the family history of cancer so their doctors can make the most appropriate screening recommendations for them.   It is also possible there is a hereditary cause for the cancer in Ms. Artz's family that she did not inherit and therefore was not identified in her.  We recommended her siblings and her sister's (who is deceased and had history of breast cancer) children , have genetic counseling and testing. Ms. Nawabi will let us know if we can be of any assistance in coordinating genetic counseling and/or testing for these family members.   FOLLOW-UP: Lastly, we discussed with Ms. Quant that cancer genetics is a rapidly advancing field and it is possible that new genetic tests will be appropriate for her and/or her family members in the future. We encouraged her to remain in contact with cancer genetics on an annual basis so we can update her personal and family histories and let her know of advances in cancer genetics that may benefit this family.   Our contact number was provided. Ms. Newnam questions were answered to her satisfaction, and she knows she is welcome to call us at anytime with additional questions or concerns.  Faith Rogue, MS Genetic Counselor [email or web address] Phone: [phone]

## 2018-09-24 ENCOUNTER — Telehealth: Payer: Self-pay | Admitting: Hematology and Oncology

## 2018-09-24 DIAGNOSIS — E113393 Type 2 diabetes mellitus with moderate nonproliferative diabetic retinopathy without macular edema, bilateral: Secondary | ICD-10-CM | POA: Diagnosis not present

## 2018-09-24 LAB — HM DIABETES EYE EXAM

## 2018-09-24 NOTE — Telephone Encounter (Signed)
Scheduled appt per 1/24 sch message - pt is aware of appt date and time   

## 2018-09-24 NOTE — Telephone Encounter (Signed)
Duplicate

## 2018-09-28 ENCOUNTER — Ambulatory Visit (HOSPITAL_COMMUNITY)
Admission: RE | Admit: 2018-09-28 | Discharge: 2018-09-28 | Disposition: A | Discharge status: Home / Self Care | Payer: Medicare Other | Source: Ambulatory Visit | Attending: Hematology and Oncology | Admitting: Hematology and Oncology

## 2018-09-28 ENCOUNTER — Ambulatory Visit (HOSPITAL_BASED_OUTPATIENT_CLINIC_OR_DEPARTMENT_OTHER)
Admission: RE | Admit: 2018-09-28 | Discharge: 2018-09-28 | Disposition: A | Discharge status: Home / Self Care | Payer: Medicare Other | Source: Ambulatory Visit | Attending: Hematology and Oncology | Admitting: Hematology and Oncology

## 2018-09-28 ENCOUNTER — Inpatient Hospital Stay: Payer: Medicare Other | Attending: Hematology and Oncology

## 2018-09-28 DIAGNOSIS — Z7984 Long term (current) use of oral hypoglycemic drugs: Secondary | ICD-10-CM | POA: Insufficient documentation

## 2018-09-28 DIAGNOSIS — Z170 Estrogen receptor positive status [ER+]: Secondary | ICD-10-CM | POA: Insufficient documentation

## 2018-09-28 DIAGNOSIS — N179 Acute kidney failure, unspecified: Secondary | ICD-10-CM | POA: Insufficient documentation

## 2018-09-28 DIAGNOSIS — E119 Type 2 diabetes mellitus without complications: Secondary | ICD-10-CM | POA: Insufficient documentation

## 2018-09-28 DIAGNOSIS — E785 Hyperlipidemia, unspecified: Secondary | ICD-10-CM | POA: Insufficient documentation

## 2018-09-28 DIAGNOSIS — Z5112 Encounter for antineoplastic immunotherapy: Secondary | ICD-10-CM | POA: Insufficient documentation

## 2018-09-28 DIAGNOSIS — Z5189 Encounter for other specified aftercare: Secondary | ICD-10-CM | POA: Insufficient documentation

## 2018-09-28 DIAGNOSIS — C50412 Malignant neoplasm of upper-outer quadrant of left female breast: Secondary | ICD-10-CM | POA: Insufficient documentation

## 2018-09-28 DIAGNOSIS — E860 Dehydration: Secondary | ICD-10-CM | POA: Insufficient documentation

## 2018-09-28 DIAGNOSIS — I10 Essential (primary) hypertension: Secondary | ICD-10-CM

## 2018-09-28 DIAGNOSIS — E871 Hypo-osmolality and hyponatremia: Secondary | ICD-10-CM | POA: Insufficient documentation

## 2018-09-28 DIAGNOSIS — C50919 Malignant neoplasm of unspecified site of unspecified female breast: Secondary | ICD-10-CM | POA: Diagnosis not present

## 2018-09-28 DIAGNOSIS — Z5111 Encounter for antineoplastic chemotherapy: Secondary | ICD-10-CM | POA: Insufficient documentation

## 2018-09-28 DIAGNOSIS — D72819 Decreased white blood cell count, unspecified: Secondary | ICD-10-CM | POA: Insufficient documentation

## 2018-09-28 DIAGNOSIS — D649 Anemia, unspecified: Secondary | ICD-10-CM | POA: Insufficient documentation

## 2018-09-28 DIAGNOSIS — E876 Hypokalemia: Secondary | ICD-10-CM | POA: Insufficient documentation

## 2018-09-28 DIAGNOSIS — Z79899 Other long term (current) drug therapy: Secondary | ICD-10-CM | POA: Insufficient documentation

## 2018-09-28 DIAGNOSIS — C50912 Malignant neoplasm of unspecified site of left female breast: Secondary | ICD-10-CM | POA: Diagnosis not present

## 2018-09-28 MED ORDER — SODIUM CHLORIDE (PF) 0.9 % IJ SOLN
INTRAMUSCULAR | Status: AC
  Filled 2018-09-28: qty 50

## 2018-09-28 MED ORDER — TECHNETIUM TC 99M MEDRONATE IV KIT
19.5000 | PACK | Freq: Once | INTRAVENOUS | Status: AC | PRN
  Administered 2018-09-28: 19.5 via INTRAVENOUS

## 2018-09-28 MED ORDER — IOHEXOL 300 MG/ML  SOLN
100.0000 mL | Freq: Once | INTRAMUSCULAR | Status: AC | PRN
  Administered 2018-09-28: 100 mL via INTRAVENOUS

## 2018-09-28 NOTE — Progress Notes (Signed)
  Echocardiogram 2D Echocardiogram has been performed.  Darlina Sicilian M 09/28/2018, 11:00 AM

## 2018-09-29 ENCOUNTER — Encounter: Payer: Self-pay | Admitting: *Deleted

## 2018-09-29 ENCOUNTER — Inpatient Hospital Stay: Payer: Medicare Other

## 2018-09-29 ENCOUNTER — Ambulatory Visit (INDEPENDENT_AMBULATORY_CARE_PROVIDER_SITE_OTHER): Payer: Medicare Other | Admitting: Family Medicine

## 2018-09-29 ENCOUNTER — Encounter: Payer: Self-pay | Admitting: Family Medicine

## 2018-09-29 ENCOUNTER — Inpatient Hospital Stay (HOSPITAL_BASED_OUTPATIENT_CLINIC_OR_DEPARTMENT_OTHER): Payer: Medicare Other | Admitting: Hematology and Oncology

## 2018-09-29 VITALS — BP 120/78 | HR 77 | Temp 97.3°F | Ht 62.0 in | Wt 165.2 lb

## 2018-09-29 DIAGNOSIS — Z170 Estrogen receptor positive status [ER+]: Principal | ICD-10-CM

## 2018-09-29 DIAGNOSIS — Z79899 Other long term (current) drug therapy: Secondary | ICD-10-CM | POA: Diagnosis not present

## 2018-09-29 DIAGNOSIS — N179 Acute kidney failure, unspecified: Secondary | ICD-10-CM | POA: Diagnosis not present

## 2018-09-29 DIAGNOSIS — Z5112 Encounter for antineoplastic immunotherapy: Secondary | ICD-10-CM | POA: Diagnosis not present

## 2018-09-29 DIAGNOSIS — Z5111 Encounter for antineoplastic chemotherapy: Secondary | ICD-10-CM | POA: Diagnosis not present

## 2018-09-29 DIAGNOSIS — I10 Essential (primary) hypertension: Secondary | ICD-10-CM | POA: Diagnosis not present

## 2018-09-29 DIAGNOSIS — Z7984 Long term (current) use of oral hypoglycemic drugs: Secondary | ICD-10-CM

## 2018-09-29 DIAGNOSIS — E871 Hypo-osmolality and hyponatremia: Secondary | ICD-10-CM | POA: Diagnosis not present

## 2018-09-29 DIAGNOSIS — D649 Anemia, unspecified: Secondary | ICD-10-CM | POA: Diagnosis not present

## 2018-09-29 DIAGNOSIS — E119 Type 2 diabetes mellitus without complications: Secondary | ICD-10-CM

## 2018-09-29 DIAGNOSIS — K769 Liver disease, unspecified: Secondary | ICD-10-CM

## 2018-09-29 DIAGNOSIS — E860 Dehydration: Secondary | ICD-10-CM | POA: Diagnosis not present

## 2018-09-29 DIAGNOSIS — D72819 Decreased white blood cell count, unspecified: Secondary | ICD-10-CM | POA: Diagnosis not present

## 2018-09-29 DIAGNOSIS — C50412 Malignant neoplasm of upper-outer quadrant of left female breast: Secondary | ICD-10-CM

## 2018-09-29 DIAGNOSIS — E113293 Type 2 diabetes mellitus with mild nonproliferative diabetic retinopathy without macular edema, bilateral: Secondary | ICD-10-CM

## 2018-09-29 DIAGNOSIS — E876 Hypokalemia: Secondary | ICD-10-CM | POA: Diagnosis not present

## 2018-09-29 DIAGNOSIS — E11319 Type 2 diabetes mellitus with unspecified diabetic retinopathy without macular edema: Secondary | ICD-10-CM | POA: Insufficient documentation

## 2018-09-29 DIAGNOSIS — Z95828 Presence of other vascular implants and grafts: Secondary | ICD-10-CM | POA: Insufficient documentation

## 2018-09-29 DIAGNOSIS — Z5189 Encounter for other specified aftercare: Secondary | ICD-10-CM | POA: Diagnosis not present

## 2018-09-29 LAB — CBC WITH DIFFERENTIAL (CANCER CENTER ONLY)
Abs Immature Granulocytes: 0.01 10*3/uL (ref 0.00–0.07)
Basophils Absolute: 0 10*3/uL (ref 0.0–0.1)
Basophils Relative: 1 %
Eosinophils Absolute: 0.1 10*3/uL (ref 0.0–0.5)
Eosinophils Relative: 3 %
HCT: 33 % — ABNORMAL LOW (ref 36.0–46.0)
Hemoglobin: 11.4 g/dL — ABNORMAL LOW (ref 12.0–15.0)
Immature Granulocytes: 0 %
Lymphocytes Relative: 32 %
Lymphs Abs: 1.7 10*3/uL (ref 0.7–4.0)
MCH: 33.6 pg (ref 26.0–34.0)
MCHC: 34.5 g/dL (ref 30.0–36.0)
MCV: 97.3 fL (ref 80.0–100.0)
MONO ABS: 0.6 10*3/uL (ref 0.1–1.0)
MONOS PCT: 11 %
Neutro Abs: 2.9 10*3/uL (ref 1.7–7.7)
Neutrophils Relative %: 53 %
Platelet Count: 212 10*3/uL (ref 150–400)
RBC: 3.39 MIL/uL — ABNORMAL LOW (ref 3.87–5.11)
RDW: 12.2 % (ref 11.5–15.5)
WBC Count: 5.3 10*3/uL (ref 4.0–10.5)
nRBC: 0 % (ref 0.0–0.2)

## 2018-09-29 LAB — CMP (CANCER CENTER ONLY)
ALT: 22 U/L (ref 0–44)
AST: 18 U/L (ref 15–41)
Albumin: 3.9 g/dL (ref 3.5–5.0)
Alkaline Phosphatase: 72 U/L (ref 38–126)
Anion gap: 9 (ref 5–15)
BILIRUBIN TOTAL: 0.7 mg/dL (ref 0.3–1.2)
BUN: 20 mg/dL (ref 8–23)
CO2: 26 mmol/L (ref 22–32)
CREATININE: 0.91 mg/dL (ref 0.44–1.00)
Calcium: 9.2 mg/dL (ref 8.9–10.3)
Chloride: 105 mmol/L (ref 98–111)
GFR, Est AFR Am: >60 mL/min (ref >60)
GFR, Estimated: >60 mL/min (ref >60)
Glucose, Bld: 105 mg/dL — ABNORMAL HIGH (ref 70–99)
Potassium: 3.4 mmol/L — ABNORMAL LOW (ref 3.5–5.1)
Sodium: 140 mmol/L (ref 135–145)
Total Protein: 7.2 g/dL (ref 6.5–8.1)

## 2018-09-29 LAB — POCT GLYCOSYLATED HEMOGLOBIN (HGB A1C): Hemoglobin A1C: 6 % — AB (ref 4.0–5.6)

## 2018-09-29 MED ORDER — HEPARIN SOD (PORK) LOCK FLUSH 100 UNIT/ML IV SOLN
500.0000 [IU] | Freq: Once | INTRAVENOUS | Status: AC
  Administered 2018-09-29: 500 [IU]
  Filled 2018-09-29: qty 5

## 2018-09-29 MED ORDER — METFORMIN HCL ER 500 MG PO TB24: 500.0000 mg | ORAL_TABLET | Freq: Every day | ORAL | 3 refills | Status: DC

## 2018-09-29 MED ORDER — SODIUM CHLORIDE 0.9% FLUSH
10.0000 mL | Freq: Once | INTRAVENOUS | Status: AC
  Administered 2018-09-29: 10 mL
  Filled 2018-09-29: qty 10

## 2018-09-29 NOTE — Assessment & Plan Note (Signed)
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 Neoadjuvant chemotherapy with TCH Perjeta 6 cycles followed by Herceptin maintenance for 1 year 2. Followed by mastectomy with sentinel lymph node study 3. Followed by adjuvant radiation therapy  ---------------------------------------------------------------  CT chest abdomen pelvis and bone scan: 09/28/2018: 3 mm lung nodule and 1 cm liver subcapsular lesion We will get an MRI of the liver.  The lung nodule will need a CT in 3 months. She had a bone lesion on the bone scan which is related to her knee replacement surgery and does not need any additional work-up.   Plan: Cycle 1 day 1 TCH Perjeta to start 09/30/2018. Antiemetic medications were reviewed Return to clinic in 1 week for toxicity check Return to clinic in 2 weeks to start chemotherapy.

## 2018-09-29 NOTE — Assessment & Plan Note (Signed)
In active treatment .[name] upcoming chemo on 2/5.  Pt doing remarkably well with mood.

## 2018-09-29 NOTE — Progress Notes (Signed)
Subjective:    Patient ID: Katie [name], female    DOB: Sep 13, 1945, 73 y.o.   MRN: [id number]  HPI    73 year old female patient presents for DM follow up.  Recent Dx of left breast cancer. Chemo starts in next few days. Right needle biopsy negative  Reassess with MRI after 3 treatments.  Breast skin biopsy negative...dermatitis. redness improving.  Bone scan, CT chest/abd pelvis scan.. unremarkable .[name] may eval right liver lobe lesion further.   Has port placed.  Diabetes:   Good control on metfomrin Lab Results  Component Value Date   HGBA1C 6.0 (A) 09/29/2018  Using medications without difficulties: Hypoglycemic episodes:none Hyperglycemic episodes:none Feet problems: no ulcer Blood Sugars averaging: FBS 118-156, 2 hours after meal 126-170 eye exam within last year: mild diabetic retinopathy. 1/202020  She has had DM  X 15 years.   Blood pressure 120/78, pulse 77, temperature (!) 97.3 F (36.3 C), temperature source Oral, height 5\' 2"  (1.575 m), weight 165 lb 4 oz (75 kg), last menstrual period 08/26/1992, SpO2 95 %. Social History /Family History/Past Medical History reviewed in detail and updated in EMR if needed.  Review of Systems  Constitutional: Negative for fatigue and fever.  HENT: Negative for ear pain.   Eyes: Negative for pain.  Respiratory: Negative for chest tightness and shortness of breath.   Cardiovascular: Negative for chest pain, palpitations and leg swelling.  Gastrointestinal: Negative for abdominal pain.  Genitourinary: Negative for dysuria.       Objective:   Physical Exam Constitutional:      General: She is not in acute distress.    Appearance: Normal appearance. She is well-developed. She is not ill-appearing or toxic-appearing.  HENT:     Head: Normocephalic.     Right Ear: Hearing, tympanic membrane, ear canal and external ear normal. Tympanic membrane is not erythematous, retracted or bulging.     Left Ear: Hearing, tympanic  membrane, ear canal and external ear normal. Tympanic membrane is not erythematous, retracted or bulging.     Nose: No mucosal edema or rhinorrhea.     Right Sinus: No maxillary sinus tenderness or frontal sinus tenderness.     Left Sinus: No maxillary sinus tenderness or frontal sinus tenderness.     Mouth/Throat:     Pharynx: Uvula midline.  Eyes:     General: Lids are normal. Lids are everted, no foreign bodies appreciated.     Conjunctiva/sclera: Conjunctivae normal.     Pupils: Pupils are equal, round, and reactive to light.  Neck:     Musculoskeletal: Normal range of motion and neck supple.     Thyroid: No thyroid mass or thyromegaly.     Vascular: No carotid bruit.     Trachea: Trachea normal.  Cardiovascular:     Rate and Rhythm: Normal rate and regular rhythm.     Pulses: Normal pulses.     Heart sounds: Normal heart sounds, S1 normal and S2 normal. No murmur. No friction rub. No gallop.   Pulmonary:     Effort: Pulmonary effort is normal. No tachypnea or respiratory distress.     Breath sounds: Normal breath sounds. No decreased breath sounds, wheezing, rhonchi or rales.  Abdominal:     General: Bowel sounds are normal.     Palpations: Abdomen is soft.     Tenderness: There is no abdominal tenderness.  Skin:    General: Skin is warm and dry.     Findings: No rash.  Neurological:  Mental Status: She is alert.  Psychiatric:        Mood and Affect: Mood is not anxious or depressed.        Speech: Speech normal.        Behavior: Behavior normal. Behavior is cooperative.        Thought Content: Thought content normal.        Judgment: Judgment normal.           Assessment & Plan:

## 2018-09-29 NOTE — Patient Instructions (Addendum)
Cancel AMW and CPX for now.   Re-schedule to 12/2018.  Keep up healthy diet and exercise as able.  Can take extra metformin if blood sugars go up after steroid dose.

## 2018-09-29 NOTE — Assessment & Plan Note (Signed)
Excellent control on metfomrin. Expect increase in glucose with upcoming stress, chemo and steroids... okay to increase metformin to 2 tabs daily if CBG > 150 daily. Follow up with labs prior in 3 month at [facility].

## 2018-09-29 NOTE — Assessment & Plan Note (Signed)
Followed by opthomology. Uptodate with eye exam.

## 2018-09-29 NOTE — Progress Notes (Signed)
Patient Care Team: [name], MD as PCP - General (Family Medicine) Birder [name], MD as Referring Physician (Ophthalmology) [name], MD as Consulting Physician (Dermatology) [name], MD as Consulting Physician (Orthopedic Surgery) [name], MD as Consulting Physician (General Surgery) [name], MD as Consulting Physician (Dentistry) [name], MD as Consulting Physician (General Surgery) [name], MD as Consulting Physician (Hematology and Oncology) [name], MD as Attending Physician (Radiation Oncology)  DIAGNOSIS:    ICD-10-CM   1. Liver disease K76.9 MR LIVER W WO CONTRAST  2. Malignant neoplasm of upper-outer quadrant of left breast in female, estrogen receptor positive ([place]) C50.412    Z17.0     SUMMARY OF ONCOLOGIC HISTORY:   Malignant neoplasm of upper-outer quadrant of left breast in female, estrogen receptor positive ([place])   09/14/2018 Initial Diagnosis    With prior history of breast implants that were ruptured/replaced, patient had cellulitis LIQ left breast for 1 week, and UOQ asymmetry 1.5 cm, MRI right breast biopsy benign, left breast numerous masses 7 cm skin thickening: 2 biopsies from left breast: Grade 2-3 IDC ER PR positive, HER-2 positive, Ki-67 50%    09/16/2018 Cancer Staging    Staging form: Breast, AJCC 8th Edition - Clinical stage from 09/16/2018: Stage IIIB (cT4, cN0, cM0, G3, ER+, PR+, HER2+) - Signed by [name], MD on 09/16/2018    09/29/2018 -  Neo-Adjuvant Chemotherapy    TCHP q 3 weeks     CHIEF COMPLIANT: Follow-up prior to Cycle 1 of Bunn Perjeta  INTERVAL HISTORY: Katie [name] is a 73 y.o. with above-mentioned history of left breast cancer. Her most recent ECHO was 09/28/18. A CT CAP from 09/28/18 showed no evidence of metastatic disease except a small, 1.2cm lesion, on the right liver for which an MRI was recommended and a pulmonary nodule measuring 31m. She has a history of  cholecystectomy and [place] showed fat on the liver. A bone scan from 09/28/18 showed uptake in the left mid tibia, a possible metastatic focus. She has a history of left knee replacement.   She presents to the clinic today with her husband for cycle 1 of [place] which will begin tomorrow. She notes redness at her port site. She has an appointment with her PCP who will check her sugars and A1C. She requested a note to provide to the insurance company for her canceled [employer] Her labs from today are WNL.  REVIEW OF SYSTEMS:   Constitutional: Denies fevers, chills or abnormal weight loss Eyes: Denies blurriness of vision Ears, nose, mouth, throat, and face: Denies mucositis or sore throat Respiratory: Denies cough, dyspnea or wheezes Cardiovascular: Denies palpitation, chest discomfort Gastrointestinal: Denies nausea, heartburn or change in bowel habits Skin: Denies abnormal skin rashes (+) redness at port site Lymphatics: Denies new lymphadenopathy or easy bruising Neurological: Denies numbness, tingling or new weaknesses Behavioral/Psych: Mood is stable, no new changes  Extremities: No lower extremity edema (+) nodule on lower, left shin Breast: denies any pain or lumps or nodules in either breasts All other systems were reviewed with the patient and are negative.  I have reviewed the past medical history, past surgical history, social history and family history with the patient and they are unchanged from previous note.  ALLERGIES:  is allergic to erythromycin; ciprofloxacin; daypro [oxaprozin]; enalapril maleate; nsaids; and vioxx [rofecoxib].  MEDICATIONS:  Current Outpatient Medications  Medication Sig Dispense Refill   atorvastatin (LIPITOR) 10 MG tablet  Take 1 tablet (10 mg total) by mouth daily. 90 tablet 3   Azelastine HCl 137 MCG/SPRAY SOLN instill 1 spray into each nostril twice a day for 1 week then if needed 30 mL 5   Calcium-Vitamin D-Vitamin K  (VIACTIV) 768-115-72 MG-UNT-MCG CHEW Chew 1 each by mouth daily.     cholecalciferol (VITAMIN D) 400 units TABS tablet Take 400 Units by mouth 2 (two) times daily.     clobetasol (TEMOVATE) 0.05 % external solution Apply 1 application topically. Reported on 08/16/2015     fluticasone (FLONASE) 50 MCG/ACT nasal spray instill 2 sprays into each nostril once daily 16 g 5   glucose blood (FREESTYLE LITE) test strip Use to check blood sugar daily.  Dx: E11.9 50 each 12   hydrochlorothiazide (HYDRODIURIL) 25 MG tablet Take 1 tablet (25 mg total) by mouth daily. 90 tablet 3   lidocaine-prilocaine (EMLA) cream Apply to affected area once 30 g 3   loratadine (CLARITIN) 10 MG tablet Take 10 mg by mouth daily.     LORazepam (ATIVAN) 0.5 MG tablet Take 1 tablet (0.5 mg total) by mouth at bedtime as needed for sleep. 30 tablet 0   losartan (COZAAR) 100 MG tablet Take 1 tablet (100 mg total) by mouth daily. 90 tablet 3   metFORMIN (GLUCOPHAGE-XR) 500 MG 24 hr tablet Take 1 tablet (500 mg total) by mouth daily with breakfast. 90 tablet 3   metroNIDAZOLE (METROCREAM) 0.75 % cream Apply 1 application topically 2 (two) times daily as needed.     ondansetron (ZOFRAN) 8 MG tablet Take 1 tablet (8 mg total) by mouth 2 (two) times daily as needed (Nausea or vomiting). Begin 4 days after chemotherapy. 30 tablet 1   prochlorperazine (COMPAZINE) 10 MG tablet Take 1 tablet (10 mg total) by mouth every 6 (six) hours as needed (Nausea or vomiting). 30 tablet 1   triamcinolone cream (KENALOG) 0.1 % Apply 1 application topically 2 (two) times daily. 15 g 0   valACYclovir (VALTREX) 500 MG tablet Take 1 tablet (500 mg total) by mouth 2 (two) times daily. (Patient taking differently: Take 500 mg by mouth as needed. ) 6 tablet 2   No current facility-administered medications for this visit.     PHYSICAL EXAMINATION: ECOG PERFORMANCE STATUS: 1 - Symptomatic but completely ambulatory  Vitals:   09/29/18 1452    BP: (!) 145/58  Pulse: 71  Resp: 17  Temp: (!) 97.5 F (36.4 C)  SpO2: 97%   Filed Weights   09/29/18 1452  Weight: 75.3 kg    GENERAL: alert, no distress and comfortable SKIN: skin color, texture, turgor are normal, no rashes or significant lesions EYES: normal, Conjunctiva are pink and non-injected, sclera clear OROPHARYNX: no exudate, no erythema and lips, buccal mucosa, and tongue normal  NECK: supple, thyroid normal size, non-tender, without nodularity LYMPH: no palpable lymphadenopathy in the cervical, axillary or inguinal LUNGS: clear to auscultation and percussion with normal breathing effort HEART: regular rate & rhythm and no murmurs and no lower extremity edema ABDOMEN: abdomen soft, non-tender and normal bowel sounds MUSCULOSKELETAL: no cyanosis of digits and no clubbing  NEURO: alert & oriented x 3 with fluent speech, no focal motor/sensory deficits EXTREMITIES: No lower extremity edema   LABORATORY DATA:  I have reviewed the data as listed CMP Latest Ref Rng & Units 09/29/2018 09/16/2018 10/13/2017  Glucose 70 - 99 mg/dL 105(H) 162(H) 130(H)  BUN 8 - 23 mg/dL 20 24(H) 25(H)  Creatinine 0.44 - 1.00  mg/dL 0.91 1.05(H) 0.91  Sodium 135 - 145 mmol/L 140 140 138  Potassium 3.5 - 5.1 mmol/L 3.4(L) 3.8 4.0  Chloride 98 - 111 mmol/L 105 103 101  CO2 22 - 32 mmol/L _0 Calcium 8.9 - 10.3 mg/dL 9.2 10.5(H) 9.4  Total Protein 6.5 - 8.1 g/dL 7.2 7.6 7.2  Total Bilirubin 0.3 - 1.2 mg/dL 0.7 0.7 0.5  Alkaline Phos 38 - 126 U/L 72 72 60  AST 15 - 41 U/L _1 ALT 0 - 44 U/L _2 Lab Results  Component Value Date   WBC 5.3 09/29/2018   HGB 11.4 (L) 09/29/2018   HCT 33.0 (L) 09/29/2018   MCV 97.3 09/29/2018   PLT 212 09/29/2018   NEUTROABS 2.9 09/29/2018    ASSESSMENT & PLAN:  Malignant neoplasm of upper-outer quadrant of left breast in female, estrogen receptor positive ([place]) With prior history of breast implants that were ruptured/replaced, patient  had cellulitis LIQ left breast for 1 week, and UOQ asymmetry 1.5 cm, MRI right breast biopsy benign, left breast numerous masses 7 cm skin thickening: 2 biopsies from left breast: Grade 2-3 IDC ER PR positive, HER-2 positive, Ki-67 50% Clinical stage: T4 N0 M0 stage IIIb  Treatment plan 1. Neoadjuvant chemotherapy with TCH Perjeta 6 cycles followed by Herceptin maintenance for 1 year 2. Followed by mastectomy with sentinel lymph node study 3. Followed by adjuvant radiation therapy  ---------------------------------------------------------------  CT chest abdomen pelvis and bone scan: 09/28/2018: 3 mm lung nodule and 1 cm liver subcapsular lesion We will get an MRI of the liver.  The lung nodule will need a CT in 3 months. She had a bone lesion on the bone scan which is related to her knee replacement surgery and does not need any additional work-up.   Plan: Cycle 1 day 1 TCH Perjeta to start 09/30/2018. Antiemetic medications were reviewed Return to clinic in 1 week for toxicity check Return to clinic in 2 weeks to start chemotherapy.     Orders Placed This Encounter  Procedures   MR LIVER W WO CONTRAST    Standing Status:   Future    Standing Expiration Date:   11/28/2019    Order Specific Question:   ** REASON FOR EXAM (FREE TEXT)    Answer:   Breast cancer with a liver lesion on CT scans    Order Specific Question:   If indicated for the ordered procedure, I authorize the administration of contrast media per Radiology protocol    Answer:   Yes    Order Specific Question:   What is the patient's sedation requirement?    Answer:   No Sedation    Order Specific Question:   Does the patient have a pacemaker or implanted devices?    Answer:   No    Order Specific Question:   Radiology Contrast Protocol - do NOT remove file path    Answer:   [email or web address]    Order Specific Question:   Preferred imaging location?    Answer:   [facility] (table  limit-550lbs)   The patient has a good understanding of the overall plan. she agrees with it. she will call with any problems that may develop before the next visit here.  [name], MD 09/29/2018  Julious Oka Dorshimer am acting as scribe for Dr. [name].  I have reviewed the above documentation for accuracy and completeness, and I agree with the above.

## 2018-09-29 NOTE — Assessment & Plan Note (Signed)
Well controlled. Continue current medication.  

## 2018-09-30 ENCOUNTER — Inpatient Hospital Stay: Payer: Medicare Other

## 2018-09-30 ENCOUNTER — Telehealth: Payer: Self-pay

## 2018-09-30 ENCOUNTER — Telehealth: Payer: Self-pay | Admitting: Hematology and Oncology

## 2018-09-30 VITALS — BP 144/64 | HR 82 | Temp 97.7°F | Resp 16

## 2018-09-30 DIAGNOSIS — Z5112 Encounter for antineoplastic immunotherapy: Secondary | ICD-10-CM | POA: Diagnosis not present

## 2018-09-30 DIAGNOSIS — Z170 Estrogen receptor positive status [ER+]: Principal | ICD-10-CM

## 2018-09-30 DIAGNOSIS — D72819 Decreased white blood cell count, unspecified: Secondary | ICD-10-CM | POA: Diagnosis not present

## 2018-09-30 DIAGNOSIS — Z5111 Encounter for antineoplastic chemotherapy: Secondary | ICD-10-CM | POA: Diagnosis not present

## 2018-09-30 DIAGNOSIS — C50412 Malignant neoplasm of upper-outer quadrant of left female breast: Secondary | ICD-10-CM | POA: Diagnosis not present

## 2018-09-30 DIAGNOSIS — D649 Anemia, unspecified: Secondary | ICD-10-CM | POA: Diagnosis not present

## 2018-09-30 MED ORDER — TRASTUZUMAB CHEMO 150 MG IV SOLR
600.0000 mg | Freq: Once | INTRAVENOUS | Status: AC
  Administered 2018-09-30: 600 mg via INTRAVENOUS
  Filled 2018-09-30: qty 28.57

## 2018-09-30 MED ORDER — SODIUM CHLORIDE 0.9% FLUSH
10.0000 mL | INTRAVENOUS | Status: DC | PRN
  Administered 2018-09-30: 10 mL
  Filled 2018-09-30: qty 10

## 2018-09-30 MED ORDER — ACETAMINOPHEN 325 MG PO TABS
650.0000 mg | ORAL_TABLET | Freq: Once | ORAL | Status: AC
  Administered 2018-09-30: 650 mg via ORAL

## 2018-09-30 MED ORDER — SODIUM CHLORIDE 0.9 % IV SOLN
Freq: Once | INTRAVENOUS | Status: AC
  Administered 2018-09-30: 08:00:00 via INTRAVENOUS
  Filled 2018-09-30: qty 250

## 2018-09-30 MED ORDER — DIPHENHYDRAMINE HCL 25 MG PO CAPS
50.0000 mg | ORAL_CAPSULE | Freq: Once | ORAL | Status: AC
  Administered 2018-09-30: 50 mg via ORAL

## 2018-09-30 MED ORDER — SODIUM CHLORIDE 0.9 % IV SOLN
420.0000 mg | Freq: Once | INTRAVENOUS | Status: AC
  Administered 2018-09-30: 420 mg via INTRAVENOUS
  Filled 2018-09-30: qty 14

## 2018-09-30 MED ORDER — SODIUM CHLORIDE 0.9 % IV SOLN
75.0000 mg/m2 | Freq: Once | INTRAVENOUS | Status: AC
  Administered 2018-09-30: 140 mg via INTRAVENOUS
  Filled 2018-09-30: qty 14

## 2018-09-30 MED ORDER — ACETAMINOPHEN 325 MG PO TABS
ORAL_TABLET | ORAL | Status: AC
  Filled 2018-09-30: qty 2

## 2018-09-30 MED ORDER — HEPARIN SOD (PORK) LOCK FLUSH 100 UNIT/ML IV SOLN
500.0000 [IU] | Freq: Once | INTRAVENOUS | Status: AC | PRN
  Administered 2018-09-30: 500 [IU]
  Filled 2018-09-30: qty 5

## 2018-09-30 MED ORDER — SODIUM CHLORIDE 0.9 % IV SOLN
510.0000 mg | Freq: Once | INTRAVENOUS | Status: AC
  Administered 2018-09-30: 510 mg via INTRAVENOUS
  Filled 2018-09-30: qty 51

## 2018-09-30 MED ORDER — PALONOSETRON HCL INJECTION 0.25 MG/5ML
0.2500 mg | Freq: Once | INTRAVENOUS | Status: AC
  Administered 2018-09-30: 0.25 mg via INTRAVENOUS

## 2018-09-30 MED ORDER — DIPHENHYDRAMINE HCL 25 MG PO CAPS
ORAL_CAPSULE | ORAL | Status: AC
  Filled 2018-09-30: qty 2

## 2018-09-30 MED ORDER — SODIUM CHLORIDE 0.9 % IV SOLN
Freq: Once | INTRAVENOUS | Status: AC
  Administered 2018-09-30: 13:00:00 via INTRAVENOUS
  Filled 2018-09-30: qty 5

## 2018-09-30 MED ORDER — PALONOSETRON HCL INJECTION 0.25 MG/5ML
INTRAVENOUS | Status: AC
  Filled 2018-09-30: qty 5

## 2018-09-30 NOTE — Patient Instructions (Addendum)
[facility] Discharge Instructions for Patients Receiving Chemotherapy  Today you received the following chemotherapy agents Herceptin, Perjeta, Taxotere and Carboplatin  To help prevent nausea and vomiting after your treatment, we encourage you to take your nausea medication as prescribed.   If you develop nausea and vomiting that is not controlled by your nausea medication, call the clinic.   BELOW ARE SYMPTOMS THAT SHOULD BE REPORTED IMMEDIATELY:  *FEVER GREATER THAN 100.5 F  *CHILLS WITH OR WITHOUT FEVER  NAUSEA AND VOMITING THAT IS NOT CONTROLLED WITH YOUR NAUSEA MEDICATION  *UNUSUAL SHORTNESS OF BREATH  *UNUSUAL BRUISING OR BLEEDING  TENDERNESS IN MOUTH AND THROAT WITH OR WITHOUT PRESENCE OF ULCERS  *URINARY PROBLEMS  *BOWEL PROBLEMS  UNUSUAL RASH Items with * indicate a potential emergency and should be followed up as soon as possible.  Feel free to call the clinic should you have any questions or concerns. The clinic phone number is [phone].  Please show the [facility] at check-in to the Emergency Department and triage nurse.  Trastuzumab injection for infusion(Herceptin) What is this medicine? TRASTUZUMAB (tras TOO zoo mab) is a monoclonal antibody. It is used to treat breast cancer and stomach cancer. This medicine may be used for other purposes; ask your health care provider or pharmacist if you have questions. COMMON BRAND NAME(S): Herceptin, Calla Kicks, OGIVRI What should I tell my health care provider before I take this medicine? They need to know if you have any of these conditions: -heart disease -heart failure -lung or breathing disease, like asthma -an unusual or allergic reaction to trastuzumab, benzyl alcohol, or other medications, foods, dyes, or preservatives -pregnant or trying to get pregnant -breast-feeding How should I use this medicine? This drug is given as an infusion into a vein. It is administered in a hospital or  clinic by a specially trained health care professional. Talk to your pediatrician regarding the use of this medicine in children. This medicine is not approved for use in children. Overdosage: If you think you have taken too much of this medicine contact a poison control center or emergency room at once. NOTE: This medicine is only for you. Do not share this medicine with others. What if I miss a dose? It is important not to miss a dose. Call your doctor or health care professional if you are unable to keep an appointment. What may interact with this medicine? This medicine may interact with the following medications: -certain types of chemotherapy, such as daunorubicin, doxorubicin, epirubicin, and idarubicin This list may not describe all possible interactions. Give your health care provider a list of all the medicines, herbs, non-prescription drugs, or dietary supplements you use. Also tell them if you smoke, drink alcohol, or use illegal drugs. Some items may interact with your medicine. What should I watch for while using this medicine? Visit your doctor for checks on your progress. Report any side effects. Continue your course of treatment even though you feel ill unless your doctor tells you to stop. Call your doctor or health care professional for advice if you get a fever, chills or sore throat, or other symptoms of a cold or flu. Do not treat yourself. Try to avoid being around people who are sick. You may experience fever, chills and shaking during your first infusion. These effects are usually mild and can be treated with other medicines. Report any side effects during the infusion to your health care professional. Fever and chills usually do not happen with later  infusions. Do not become pregnant while taking this medicine or for 7 months after stopping it. Women should inform their doctor if they wish to become pregnant or think they might be pregnant. Women of child-bearing potential  will need to have a negative pregnancy test before starting this medicine. There is a potential for serious side effects to an unborn child. Talk to your health care professional or pharmacist for more information. Do not breast-feed an infant while taking this medicine or for 7 months after stopping it. Women must use effective birth control with this medicine. What side effects may I notice from receiving this medicine? Side effects that you should report to your doctor or health care professional as soon as possible: -allergic reactions like skin rash, itching or hives, swelling of the face, lips, or tongue -chest pain or palpitations -cough -dizziness -feeling faint or lightheaded, falls -fever -general ill feeling or flu-like symptoms -signs of worsening heart failure like breathing problems; swelling in your legs and feet -unusually weak or tired Side effects that usually do not require medical attention (report to your doctor or health care professional if they continue or are bothersome): -bone pain -changes in taste -diarrhea -joint pain -nausea/vomiting -weight loss This list may not describe all possible side effects. Call your doctor for medical advice about side effects. You may report side effects to FDA at 1-800-FDA-1088. Where should I keep my medicine? This drug is given in a hospital or clinic and will not be stored at home. NOTE: This sheet is a summary. It may not cover all possible information. If you have questions about this medicine, talk to your doctor, pharmacist, or health care provider.  2019 Elsevier/Gold Standard (2016-08-06 14:37:52)   Pertuzumab injection(Perjeta) What is this medicine? PERTUZUMAB (per TOOZ ue mab) is a monoclonal antibody. It is used to treat breast cancer. This medicine may be used for other purposes; ask your health care provider or pharmacist if you have questions. COMMON BRAND NAME(S): PERJETA What should I tell my health care  provider before I take this medicine? They need to know if you have any of these conditions: -heart disease -heart failure -high blood pressure -history of irregular heart beat -recent or ongoing radiation therapy -an unusual or allergic reaction to pertuzumab, other medicines, foods, dyes, or preservatives -pregnant or trying to get pregnant -breast-feeding How should I use this medicine? This medicine is for infusion into a vein. It is given by a health care professional in a hospital or clinic setting. Talk to your pediatrician regarding the use of this medicine in children. Special care may be needed. Overdosage: If you think you have taken too much of this medicine contact a poison control center or emergency room at once. NOTE: This medicine is only for you. Do not share this medicine with others. What if I miss a dose? It is important not to miss your dose. Call your doctor or health care professional if you are unable to keep an appointment. What may interact with this medicine? Interactions are not expected. Give your health care provider a list of all the medicines, herbs, non-prescription drugs, or dietary supplements you use. Also tell them if you smoke, drink alcohol, or use illegal drugs. Some items may interact with your medicine. This list may not describe all possible interactions. Give your health care provider a list of all the medicines, herbs, non-prescription drugs, or dietary supplements you use. Also tell them if you smoke, drink alcohol, or use illegal drugs.  Some items may interact with your medicine. What should I watch for while using this medicine? Your condition will be monitored carefully while you are receiving this medicine. Report any side effects. Continue your course of treatment even though you feel ill unless your doctor tells you to stop. Do not become pregnant while taking this medicine or for 7 months after stopping it. Women should inform their doctor  if they wish to become pregnant or think they might be pregnant. Women of child-bearing potential will need to have a negative pregnancy test before starting this medicine. There is a potential for serious side effects to an unborn child. Talk to your health care professional or pharmacist for more information. Do not breast-feed an infant while taking this medicine or for 7 months after stopping it. Women must use effective birth control with this medicine. Call your doctor or health care professional for advice if you get a fever, chills or sore throat, or other symptoms of a cold or flu. Do not treat yourself. Try to avoid being around people who are sick. You may experience fever, chills, and headache during the infusion. Report any side effects during the infusion to your health care professional. What side effects may I notice from receiving this medicine? Side effects that you should report to your doctor or health care professional as soon as possible: -breathing problems -chest pain or palpitations -dizziness -feeling faint or lightheaded -fever or chills -skin rash, itching or hives -sore throat -swelling of the face, lips, or tongue -swelling of the legs or ankles -unusually weak or tired Side effects that usually do not require medical attention (report to your doctor or health care professional if they continue or are bothersome): -diarrhea -hair loss -nausea, vomiting -tiredness This list may not describe all possible side effects. Call your doctor for medical advice about side effects. You may report side effects to FDA at 1-800-FDA-1088. Where should I keep my medicine? This drug is given in a hospital or clinic and will not be stored at home. NOTE: This sheet is a summary. It may not cover all possible information. If you have questions about this medicine, talk to your doctor, pharmacist, or health care provider.  2019 Elsevier/Gold Standard (2015-09-14  12:08:50)  Carboplatin injection What is this medicine? CARBOPLATIN (KAR boe pla tin) is a chemotherapy drug. It targets fast dividing cells, like cancer cells, and causes these cells to die. This medicine is used to treat ovarian cancer and many other cancers. This medicine may be used for other purposes; ask your health care provider or pharmacist if you have questions. COMMON BRAND NAME(S): Paraplatin What should I tell my health care provider before I take this medicine? They need to know if you have any of these conditions: -blood disorders -hearing problems -kidney disease -recent or ongoing radiation therapy -an unusual or allergic reaction to carboplatin, cisplatin, other chemotherapy, other medicines, foods, dyes, or preservatives -pregnant or trying to get pregnant -breast-feeding How should I use this medicine? This drug is usually given as an infusion into a vein. It is administered in a hospital or clinic by a specially trained health care professional. Talk to your pediatrician regarding the use of this medicine in children. Special care may be needed. Overdosage: If you think you have taken too much of this medicine contact a poison control center or emergency room at once. NOTE: This medicine is only for you. Do not share this medicine with others. What if I miss a dose?  It is important not to miss a dose. Call your doctor or health care professional if you are unable to keep an appointment. What may interact with this medicine? -medicines for seizures -medicines to increase blood counts like filgrastim, pegfilgrastim, sargramostim -some antibiotics like amikacin, gentamicin, neomycin, streptomycin, tobramycin -vaccines Talk to your doctor or health care professional before taking any of these medicines: -acetaminophen -aspirin -ibuprofen -ketoprofen -naproxen This list may not describe all possible interactions. Give your health care provider a list of all the  medicines, herbs, non-prescription drugs, or dietary supplements you use. Also tell them if you smoke, drink alcohol, or use illegal drugs. Some items may interact with your medicine. What should I watch for while using this medicine? Your condition will be monitored carefully while you are receiving this medicine. You will need important blood work done while you are taking this medicine. This drug may make you feel generally unwell. This is not uncommon, as chemotherapy can affect healthy cells as well as cancer cells. Report any side effects. Continue your course of treatment even though you feel ill unless your doctor tells you to stop. In some cases, you may be given additional medicines to help with side effects. Follow all directions for their use. Call your doctor or health care professional for advice if you get a fever, chills or sore throat, or other symptoms of a cold or flu. Do not treat yourself. This drug decreases your body's ability to fight infections. Try to avoid being around people who are sick. This medicine may increase your risk to bruise or bleed. Call your doctor or health care professional if you notice any unusual bleeding. Be careful brushing and flossing your teeth or using a toothpick because you may get an infection or bleed more easily. If you have any dental work done, tell your dentist you are receiving this medicine. Avoid taking products that contain aspirin, acetaminophen, ibuprofen, naproxen, or ketoprofen unless instructed by your doctor. These medicines may hide a fever. Do not become pregnant while taking this medicine. Women should inform their doctor if they wish to become pregnant or think they might be pregnant. There is a potential for serious side effects to an unborn child. Talk to your health care professional or pharmacist for more information. Do not breast-feed an infant while taking this medicine. What side effects may I notice from receiving this  medicine? Side effects that you should report to your doctor or health care professional as soon as possible: -allergic reactions like skin rash, itching or hives, swelling of the face, lips, or tongue -signs of infection - fever or chills, cough, sore throat, pain or difficulty passing urine -signs of decreased platelets or bleeding - bruising, pinpoint red spots on the skin, black, tarry stools, nosebleeds -signs of decreased red blood cells - unusually weak or tired, fainting spells, lightheadedness -breathing problems -changes in hearing -changes in vision -chest pain -high blood pressure -low blood counts - This drug may decrease the number of white blood cells, red blood cells and platelets. You may be at increased risk for infections and bleeding. -nausea and vomiting -pain, swelling, redness or irritation at the injection site -pain, tingling, numbness in the hands or feet -problems with balance, talking, walking -trouble passing urine or change in the amount of urine Side effects that usually do not require medical attention (report to your doctor or health care professional if they continue or are bothersome): -hair loss -loss of appetite -metallic taste in the mouth  or changes in taste This list may not describe all possible side effects. Call your doctor for medical advice about side effects. You may report side effects to FDA at 1-800-FDA-1088. Where should I keep my medicine? This drug is given in a hospital or clinic and will not be stored at home. NOTE: This sheet is a summary. It may not cover all possible information. If you have questions about this medicine, talk to your doctor, pharmacist, or health care provider.  2019 Elsevier/Gold Standard (2007-11-17 14:38:05)   Docetaxel injection(Taxotere) What is this medicine? DOCETAXEL (doe se TAX el) is a chemotherapy drug. It targets fast dividing cells, like cancer cells, and causes these cells to die. This medicine is  used to treat many types of cancers like breast cancer, certain stomach cancers, head and neck cancer, lung cancer, and prostate cancer. This medicine may be used for other purposes; ask your health care provider or pharmacist if you have questions. COMMON BRAND NAME(S): Docefrez, Taxotere What should I tell my health care provider before I take this medicine? They need to know if you have any of these conditions: -infection (especially a virus infection such as chickenpox, cold sores, or herpes) -liver disease -low blood counts, like low white cell, platelet, or red cell counts -an unusual or allergic reaction to docetaxel, polysorbate 80, other chemotherapy agents, other medicines, foods, dyes, or preservatives -pregnant or trying to get pregnant -breast-feeding How should I use this medicine? This drug is given as an infusion into a vein. It is administered in a hospital or clinic by a specially trained health care professional. Talk to your pediatrician regarding the use of this medicine in children. Special care may be needed. Overdosage: If you think you have taken too much of this medicine contact a poison control center or emergency room at once. NOTE: This medicine is only for you. Do not share this medicine with others. What if I miss a dose? It is important not to miss your dose. Call your doctor or health care professional if you are unable to keep an appointment. What may interact with this medicine? -cyclosporine -erythromycin -ketoconazole -medicines to increase blood counts like filgrastim, pegfilgrastim, sargramostim -vaccines Talk to your doctor or health care professional before taking any of these medicines: -acetaminophen -aspirin -ibuprofen -ketoprofen -naproxen This list may not describe all possible interactions. Give your health care provider a list of all the medicines, herbs, non-prescription drugs, or dietary supplements you use. Also tell them if you smoke,  drink alcohol, or use illegal drugs. Some items may interact with your medicine. What should I watch for while using this medicine? Your condition will be monitored carefully while you are receiving this medicine. You will need important blood work done while you are taking this medicine. This drug may make you feel generally unwell. This is not uncommon, as chemotherapy can affect healthy cells as well as cancer cells. Report any side effects. Continue your course of treatment even though you feel ill unless your doctor tells you to stop. In some cases, you may be given additional medicines to help with side effects. Follow all directions for their use. Call your doctor or health care professional for advice if you get a fever, chills or sore throat, or other symptoms of a cold or flu. Do not treat yourself. This drug decreases your body's ability to fight infections. Try to avoid being around people who are sick. This medicine may increase your risk to bruise or bleed. Call your  doctor or health care professional if you notice any unusual bleeding. This medicine may contain alcohol in the product. You may get drowsy or dizzy. Do not drive, use machinery, or do anything that needs mental alertness until you know how this medicine affects you. Do not stand or sit up quickly, especially if you are an older patient. This reduces the risk of dizzy or fainting spells. Avoid alcoholic drinks. Do not become pregnant while taking this medicine or for 6 months after stopping it. Women should inform their doctor if they wish to become pregnant or think they might be pregnant. Men should not father a child while taking this medicine and for 3 months after stopping it. There is a potential for serious side effects to an unborn child. Talk to your health care professional or pharmacist for more information. Do not breast-feed an infant while taking this medicine or for 2 weeks after stopping it. This may interfere with  the ability to father a child. You should talk to your doctor or health care professional if you are concerned about your fertility. What side effects may I notice from receiving this medicine? Side effects that you should report to your doctor or health care professional as soon as possible: -allergic reactions like skin rash, itching or hives, swelling of the face, lips, or tongue -low blood counts - This drug may decrease the number of white blood cells, red blood cells and platelets. You may be at increased risk for infections and bleeding. -signs of infection - fever or chills, cough, sore throat, pain or difficulty passing urine -signs of decreased platelets or bleeding - bruising, pinpoint red spots on the skin, black, tarry stools, nosebleeds -signs of decreased red blood cells - unusually weak or tired, fainting spells, lightheadedness -breathing problems -fast or irregular heartbeat -low blood pressure -mouth sores -nausea and vomiting -pain, swelling, redness or irritation at the injection site -pain, tingling, numbness in the hands or feet -swelling of the ankle, feet, hands -weight gain Side effects that usually do not require medical attention (report to your doctor or health care professional if they continue or are bothersome): -bone pain -complete hair loss including hair on your head, underarms, pubic hair, eyebrows, and eyelashes -diarrhea -excessive tearing -changes in the color of fingernails -loosening of the fingernails -nausea -muscle pain -red flush to skin -sweating -weak or tired This list may not describe all possible side effects. Call your doctor for medical advice about side effects. You may report side effects to FDA at 1-800-FDA-1088. Where should I keep my medicine? This drug is given in a hospital or clinic and will not be stored at home. NOTE: This sheet is a summary. It may not cover all possible information. If you have questions about this  medicine, talk to your doctor, pharmacist, or health care provider.  2019 Elsevier/Gold Standard (2017-09-08 12:07:21)

## 2018-09-30 NOTE — Telephone Encounter (Signed)
No los °

## 2018-09-30 NOTE — Telephone Encounter (Signed)
VM left on pt's home phone stating that her cell phone is locked in the infusion room at the front desk. Instructions left to speak with the RN at the front desk to pick up phone.

## 2018-09-30 NOTE — Telephone Encounter (Signed)
VM left on pt's home phone informing her that she left her cell phone at [facility] in the infusion room.

## 2018-10-01 ENCOUNTER — Telehealth: Payer: Self-pay

## 2018-10-01 NOTE — Telephone Encounter (Signed)
Called pt and lvm with call back number to see how her first chemo went. Follow up chemo call to pt today, but unable to speak with pt.

## 2018-10-02 ENCOUNTER — Inpatient Hospital Stay: Payer: Medicare Other

## 2018-10-02 ENCOUNTER — Telehealth: Payer: Self-pay

## 2018-10-02 ENCOUNTER — Encounter: Payer: Self-pay | Admitting: Hematology and Oncology

## 2018-10-02 DIAGNOSIS — Z170 Estrogen receptor positive status [ER+]: Secondary | ICD-10-CM | POA: Diagnosis not present

## 2018-10-02 DIAGNOSIS — D72819 Decreased white blood cell count, unspecified: Secondary | ICD-10-CM | POA: Diagnosis not present

## 2018-10-02 DIAGNOSIS — Z5112 Encounter for antineoplastic immunotherapy: Secondary | ICD-10-CM | POA: Diagnosis not present

## 2018-10-02 DIAGNOSIS — D649 Anemia, unspecified: Secondary | ICD-10-CM | POA: Diagnosis not present

## 2018-10-02 DIAGNOSIS — Z5111 Encounter for antineoplastic chemotherapy: Secondary | ICD-10-CM | POA: Diagnosis not present

## 2018-10-02 DIAGNOSIS — C50412 Malignant neoplasm of upper-outer quadrant of left female breast: Secondary | ICD-10-CM

## 2018-10-02 MED ORDER — PEGFILGRASTIM-CBQV 6 MG/0.6ML ~~LOC~~ SOSY
6.0000 mg | PREFILLED_SYRINGE | Freq: Once | SUBCUTANEOUS | Status: AC
  Administered 2018-10-02: 6 mg via SUBCUTANEOUS

## 2018-10-02 MED ORDER — PEGFILGRASTIM-CBQV 6 MG/0.6ML ~~LOC~~ SOSY
PREFILLED_SYRINGE | SUBCUTANEOUS | Status: AC
  Filled 2018-10-02: qty 0.6

## 2018-10-02 NOTE — Telephone Encounter (Signed)
Spoke with patient regarding follow up after first chemotherapy infusion.   Pt reports redness on face since treatment.  No shortness of breath, chest pain, fevers, or rashes elsewhere.    Nurse educated pt of side effect of medication administered, Dexamethasone, causing facial redness.  Pt voiced understanding.  [place] for use of benadryl.  Encouraged fluids.

## 2018-10-02 NOTE — Patient Instructions (Signed)
Pegfilgrastim injection  What is this medicine?  PEGFILGRASTIM (PEG fil gra stim) is a long-acting granulocyte colony-stimulating factor that stimulates the growth of neutrophils, a type of white blood cell important in the body's fight against infection. It is used to reduce the incidence of fever and infection in patients with certain types of cancer who are receiving chemotherapy that affects the bone marrow, and to increase survival after being exposed to high doses of radiation.  This medicine may be used for other purposes; ask your health care provider or pharmacist if you have questions.  COMMON BRAND NAME(S): Fulphila, Neulasta, UDENYCA  What should I tell my health care provider before I take this medicine?  They need to know if you have any of these conditions:  -kidney disease  -latex allergy  -ongoing radiation therapy  -sickle cell disease  -skin reactions to acrylic adhesives (On-Body Injector only)  -an unusual or allergic reaction to pegfilgrastim, filgrastim, other medicines, foods, dyes, or preservatives  -pregnant or trying to get pregnant  -breast-feeding  How should I use this medicine?  This medicine is for injection under the skin. If you get this medicine at home, you will be taught how to prepare and give the pre-filled syringe or how to use the On-body Injector. Refer to the patient Instructions for Use for detailed instructions. Use exactly as directed. Tell your healthcare provider immediately if you suspect that the On-body Injector may not have performed as intended or if you suspect the use of the On-body Injector resulted in a missed or partial dose.  It is important that you put your used needles and syringes in a special sharps container. Do not put them in a trash can. If you do not have a sharps container, call your pharmacist or healthcare provider to get one.  Talk to your pediatrician regarding the use of this medicine in children. While this drug may be prescribed for  selected conditions, precautions do apply.  Overdosage: If you think you have taken too much of this medicine contact a poison control center or emergency room at once.  NOTE: This medicine is only for you. Do not share this medicine with others.  What if I miss a dose?  It is important not to miss your dose. Call your doctor or health care professional if you miss your dose. If you miss a dose due to an On-body Injector failure or leakage, a new dose should be administered as soon as possible using a single prefilled syringe for manual use.  What may interact with this medicine?  Interactions have not been studied.  Give your health care provider a list of all the medicines, herbs, non-prescription drugs, or dietary supplements you use. Also tell them if you smoke, drink alcohol, or use illegal drugs. Some items may interact with your medicine.  This list may not describe all possible interactions. Give your health care provider a list of all the medicines, herbs, non-prescription drugs, or dietary supplements you use. Also tell them if you smoke, drink alcohol, or use illegal drugs. Some items may interact with your medicine.  What should I watch for while using this medicine?  You may need blood work done while you are taking this medicine.  If you are going to need a MRI, CT scan, or other procedure, tell your doctor that you are using this medicine (On-Body Injector only).  What side effects may I notice from receiving this medicine?  Side effects that you should report to   your doctor or health care professional as soon as possible:  -allergic reactions like skin rash, itching or hives, swelling of the face, lips, or tongue  -back pain  -dizziness  -fever  -pain, redness, or irritation at site where injected  -pinpoint red spots on the skin  -red or dark-brown urine  -shortness of breath or breathing problems  -stomach or side pain, or pain at the shoulder  -swelling  -tiredness  -trouble passing urine or  change in the amount of urine  Side effects that usually do not require medical attention (report to your doctor or health care professional if they continue or are bothersome):  -bone pain  -muscle pain  This list may not describe all possible side effects. Call your doctor for medical advice about side effects. You may report side effects to FDA at 1-800-FDA-1088.  Where should I keep my medicine?  Keep out of the reach of children.  If you are using this medicine at home, you will be instructed on how to store it. Throw away any unused medicine after the expiration date on the label.  NOTE: This sheet is a summary. It may not cover all possible information. If you have questions about this medicine, talk to your doctor, pharmacist, or health care provider.   2019 Elsevier/Gold Standard (2017-11-17 16:57:08)

## 2018-10-02 NOTE — Progress Notes (Signed)
Went to lobby to introduce myself as Arboriculturist and to offer available resources.  Discussed one-time $1000 Radio broadcast assistant to assist with personal expenses while going through treatment. Patient states her household is over income qualifications.  Gave her my card for any additional financial questions or concerns.

## 2018-10-02 NOTE — Progress Notes (Signed)
Patient Care Team: [name], MD as PCP - General (Family Medicine) Birder [name], MD as Referring Physician (Ophthalmology) [name], MD as Consulting Physician (Dermatology) [name], MD as Consulting Physician (Orthopedic Surgery) [name], MD as Consulting Physician (General Surgery) [name], MD as Consulting Physician (Dentistry) [name], MD as Consulting Physician (General Surgery) [name], MD as Consulting Physician (Hematology and Oncology) [name], MD as Attending Physician (Radiation Oncology)  DIAGNOSIS:    ICD-10-CM   1. Malignant neoplasm of upper-outer quadrant of left breast in female, estrogen receptor positive ([place]) C50.412    Z17.0     SUMMARY OF ONCOLOGIC HISTORY:   Malignant neoplasm of upper-outer quadrant of left breast in female, estrogen receptor positive ([place])   09/14/2018 Initial Diagnosis    With prior history of breast implants that were ruptured/replaced, patient had cellulitis LIQ left breast for 1 week, and UOQ asymmetry 1.5 cm, MRI right breast biopsy benign, left breast numerous masses 7 cm skin thickening: 2 biopsies from left breast: Grade 2-3 IDC ER PR positive, HER-2 positive, Ki-67 50%    09/16/2018 Cancer Staging    Staging form: Breast, AJCC 8th Edition - Clinical stage from 09/16/2018: Stage IIIB (cT4, cN0, cM0, G3, ER+, PR+, HER2+) - Signed by [name], MD on 09/16/2018    09/29/2018 -  Neo-Adjuvant Chemotherapy    TCHP q 3 weeks     CHIEF COMPLIANT: Cycle 1 Day 8 of Kila Perjeta  INTERVAL HISTORY: Katie [name] is a 73 y.o. with above-mentioned history of left breast cancer currently on neoadjuvant chemotherapy with TCHP. She presents to the clinic today with her husband. She reports diarrhea that began with loose stools on Saturday and transitioned to 7-8 episodes of diarrhea yesterday. She took Imodium with no relief and has now taken Lomotil which is improving the  diarrhea. She is drinking water regularly, which made the diarrhea worse. She denies vomiting but reports severe acid reflux for which she takes Nexium. She has a rash on her right elbow but does not recall having blood drawn from that location recently. She denies any tingling or numbness in hands or feet. Her labs from today show: WBC 1.4, Hg 11.4, platelets 132.  REVIEW OF SYSTEMS:   Constitutional: Denies fevers, chills or abnormal weight loss Eyes: Denies blurriness of vision Ears, nose, mouth, throat, and face: Denies mucositis or sore throat Respiratory: Denies cough, dyspnea or wheezes Cardiovascular: Denies palpitation, chest discomfort Gastrointestinal: Denies nausea (+) heartburn (+) diarrhea  Skin: (+) rash on right elbow Lymphatics: Denies new lymphadenopathy or easy bruising Neurological: Denies numbness, tingling or new weaknesses Behavioral/Psych: Mood is stable, no new changes  Extremities: No lower extremity edema Breast: denies any pain or lumps or nodules in either breasts All other systems were reviewed with the patient and are negative.  I have reviewed the past medical history, past surgical history, social history and family history with the patient and they are unchanged from previous note.  ALLERGIES:  is allergic to erythromycin; ciprofloxacin; daypro [oxaprozin]; enalapril maleate; nsaids; and vioxx [rofecoxib].  MEDICATIONS:  Current Outpatient Medications  Medication Sig Dispense Refill  . atorvastatin (LIPITOR) 10 MG tablet Take 1 tablet (10 mg total) by mouth daily. 90 tablet 3  . Azelastine HCl 137 MCG/SPRAY SOLN instill 1 spray into each nostril twice a day for 1 week then if needed 30 mL 5  . Calcium-Vitamin D-Vitamin K (VIACTIV) 035-597-41 MG-UNT-MCG CHEW Chew 1 each by mouth daily.    [name]  cholecalciferol (VITAMIN D) 400 units TABS tablet Take 400 Units by mouth 2 (two) times daily.    . clobetasol (TEMOVATE) 0.05 % external solution Apply 1 application  topically. Reported on 08/16/2015    . diphenoxylate-atropine (LOMOTIL) 2.5-0.025 MG tablet Take 1 tablet by mouth 4 (four) times daily as needed for diarrhea or loose stools. 30 tablet 3  . fluticasone (FLONASE) 50 MCG/ACT nasal spray instill 2 sprays into each nostril once daily 16 g 5  . glucose blood (FREESTYLE LITE) test strip Use to check blood sugar daily.  Dx: E11.9 50 each 12  . hydrochlorothiazide (HYDRODIURIL) 25 MG tablet Take 1 tablet (25 mg total) by mouth daily. 90 tablet 3  . lidocaine-prilocaine (EMLA) cream Apply to affected area once 30 g 3  . loratadine (CLARITIN) 10 MG tablet Take 10 mg by mouth daily.    [name] LORazepam (ATIVAN) 0.5 MG tablet Take 1 tablet (0.5 mg total) by mouth at bedtime as needed for sleep. 30 tablet 0  . losartan (COZAAR) 100 MG tablet Take 1 tablet (100 mg total) by mouth daily. 90 tablet 3  . metFORMIN (GLUCOPHAGE-XR) 500 MG 24 hr tablet Take 1 tablet (500 mg total) by mouth daily with breakfast. 90 tablet 3  . metroNIDAZOLE (METROCREAM) 0.75 % cream Apply 1 application topically 2 (two) times daily as needed.    . ondansetron (ZOFRAN) 8 MG tablet Take 1 tablet (8 mg total) by mouth 2 (two) times daily as needed (Nausea or vomiting). Begin 4 days after chemotherapy. 30 tablet 1  . prochlorperazine (COMPAZINE) 10 MG tablet Take 1 tablet (10 mg total) by mouth every 6 (six) hours as needed (Nausea or vomiting). 30 tablet 1  . triamcinolone cream (KENALOG) 0.1 % Apply 1 application topically 2 (two) times daily. 15 g 0  . valACYclovir (VALTREX) 500 MG tablet Take 1 tablet (500 mg total) by mouth 2 (two) times daily. (Patient taking differently: Take 500 mg by mouth as needed. ) 6 tablet 2   No current facility-administered medications for this visit.     PHYSICAL EXAMINATION: ECOG PERFORMANCE STATUS: 1 - Symptomatic but completely ambulatory  Vitals:   10/06/18 1424  BP: (!) 152/51  Pulse: 85  Resp: 18  Temp: 98.2 F (36.8 C)  SpO2: 96%   Filed  Weights   10/06/18 1423 10/06/18 1424  Weight: 164 lb 8 oz (74.6 kg) 164 lb 8 oz (74.6 kg)    GENERAL: alert, no distress and comfortable SKIN: skin color, texture, turgor are normal, no rashes or significant lesions EYES: normal, Conjunctiva are pink and non-injected, sclera clear OROPHARYNX: no exudate, no erythema and lips, buccal mucosa, and tongue normal  NECK: supple, thyroid normal size, non-tender, without nodularity LYMPH: no palpable lymphadenopathy in the cervical, axillary or inguinal LUNGS: clear to auscultation and percussion with normal breathing effort HEART: regular rate & rhythm and no murmurs and no lower extremity edema ABDOMEN: abdomen soft, non-tender and normal bowel sounds MUSCULOSKELETAL: no cyanosis of digits and no clubbing  NEURO: alert & oriented x 3 with fluent speech, no focal motor/sensory deficits EXTREMITIES: No lower extremity edema  LABORATORY DATA:  I have reviewed the data as listed CMP Latest Ref Rng & Units 10/06/2018 09/29/2018 09/16/2018  Glucose 70 - 99 mg/dL 137(H) 105(H) 162(H)  BUN 8 - 23 mg/dL 20 20 24(H)  Creatinine 0.44 - 1.00 mg/dL 1.07(H) 0.91 1.05(H)  Sodium 135 - 145 mmol/L 131(L) 140 140  Potassium 3.5 - 5.1 mmol/L 3.7 3.4(L)  3.8  Chloride 98 - 111 mmol/L 95(L) 105 103  CO2 22 - 32 mmol/L 26 26 25   Calcium 8.9 - 10.3 mg/dL 9.6 9.2 10.5(H)  Total Protein 6.5 - 8.1 g/dL 7.0 7.2 7.6  Total Bilirubin 0.3 - 1.2 mg/dL 1.1 0.7 0.7  Alkaline Phos 38 - 126 U/L 73 72 72  AST 15 - 41 U/L 18 18 17   ALT 0 - 44 U/L 29 22 23     Lab Results  Component Value Date   WBC 1.4 (L) 10/06/2018   HGB 11.4 (L) 10/06/2018   HCT 32.1 (L) 10/06/2018   MCV 94.7 10/06/2018   PLT 132 (L) 10/06/2018   NEUTROABS PENDING 10/06/2018    ASSESSMENT & PLAN:  Malignant neoplasm of upper-outer quadrant of left breast in female, estrogen receptor positive ([place]) With prior history of breast implants that were ruptured/replaced, patient had cellulitis LIQ left  breast for 1 week, and UOQ asymmetry 1.5 cm, MRI right breast biopsy benign, left breast numerous masses 7 cm skin thickening: 2 biopsies from left breast: Grade 2-3 IDC ER PR positive, HER-2 positive, Ki-67 50% Clinical stage: T4 N0 M0 stage IIIb  Treatment plan 1. Neoadjuvant chemotherapy with TCH Perjeta 6 cycles followed by Herceptin maintenance for 1 year 2. Followed by mastectomy with sentinel lymph node study 3. Followed by adjuvant radiation therapy  ---------------------------------------------------------------  CT chest abdomen pelvis and bone scan: 09/28/2018: 3 mm lung nodule and 1 cm liver subcapsular lesion We will get an MRI of the liver.  The lung nodule will need a CT in 3 months. She had a bone lesion on the bone scan which is related to her knee replacement surgery and does not need any additional work-up. ------------------------------------------------------------------- Current treatment: Cycle 1 day 8 neoadjuvant TCH Perjeta Chemo toxicities: 1.  Severe diarrhea for the past 12 hours: In spite of Imodium.  I sent her prescription for Lomotil.  She took 1 tablet and since then she has not had any loose stools. 2.  Severe fatigue 3.  Leukopenia: Awaiting ANC so that we can adjust future chemotherapy treatment doses.  Labs have been reviewed Return to clinic in 2 weeks for cycle 2    No orders of the defined types were placed in this encounter.  The patient has a good understanding of the overall plan. she agrees with it. she will call with any problems that may develop before the next visit here.  [name], MD 10/06/2018  Julious Oka Dorshimer am acting as scribe for Dr. [name].  I have reviewed the above documentation for accuracy and completeness, and I agree with the above.

## 2018-10-05 ENCOUNTER — Other Ambulatory Visit: Payer: Medicare Other

## 2018-10-06 ENCOUNTER — Inpatient Hospital Stay (HOSPITAL_BASED_OUTPATIENT_CLINIC_OR_DEPARTMENT_OTHER): Payer: Medicare Other | Admitting: Hematology and Oncology

## 2018-10-06 ENCOUNTER — Inpatient Hospital Stay: Payer: Medicare Other

## 2018-10-06 ENCOUNTER — Other Ambulatory Visit: Payer: Self-pay | Admitting: Hematology and Oncology

## 2018-10-06 ENCOUNTER — Other Ambulatory Visit: Payer: Self-pay

## 2018-10-06 DIAGNOSIS — Z170 Estrogen receptor positive status [ER+]: Principal | ICD-10-CM

## 2018-10-06 DIAGNOSIS — Z5111 Encounter for antineoplastic chemotherapy: Secondary | ICD-10-CM | POA: Diagnosis not present

## 2018-10-06 DIAGNOSIS — E119 Type 2 diabetes mellitus without complications: Secondary | ICD-10-CM | POA: Diagnosis not present

## 2018-10-06 DIAGNOSIS — Z7984 Long term (current) use of oral hypoglycemic drugs: Secondary | ICD-10-CM

## 2018-10-06 DIAGNOSIS — Z5112 Encounter for antineoplastic immunotherapy: Secondary | ICD-10-CM | POA: Diagnosis not present

## 2018-10-06 DIAGNOSIS — D72819 Decreased white blood cell count, unspecified: Secondary | ICD-10-CM | POA: Diagnosis not present

## 2018-10-06 DIAGNOSIS — Z79899 Other long term (current) drug therapy: Secondary | ICD-10-CM | POA: Diagnosis not present

## 2018-10-06 DIAGNOSIS — E876 Hypokalemia: Secondary | ICD-10-CM | POA: Diagnosis not present

## 2018-10-06 DIAGNOSIS — C50412 Malignant neoplasm of upper-outer quadrant of left female breast: Secondary | ICD-10-CM

## 2018-10-06 DIAGNOSIS — Z95828 Presence of other vascular implants and grafts: Secondary | ICD-10-CM

## 2018-10-06 DIAGNOSIS — D649 Anemia, unspecified: Secondary | ICD-10-CM | POA: Diagnosis not present

## 2018-10-06 DIAGNOSIS — E871 Hypo-osmolality and hyponatremia: Secondary | ICD-10-CM | POA: Diagnosis not present

## 2018-10-06 LAB — CMP (CANCER CENTER ONLY)
ALBUMIN: 3.8 g/dL (ref 3.5–5.0)
ALT: 29 U/L (ref 0–44)
AST: 18 U/L (ref 15–41)
Alkaline Phosphatase: 73 U/L (ref 38–126)
Anion gap: 10 (ref 5–15)
BUN: 20 mg/dL (ref 8–23)
CO2: 26 mmol/L (ref 22–32)
Calcium: 9.6 mg/dL (ref 8.9–10.3)
Chloride: 95 mmol/L — ABNORMAL LOW (ref 98–111)
Creatinine: 1.07 mg/dL — ABNORMAL HIGH (ref 0.44–1.00)
GFR, Est AFR Am: 60 mL/min — ABNORMAL LOW (ref >60)
GFR, Estimated: 51 mL/min — ABNORMAL LOW (ref >60)
GLUCOSE: 137 mg/dL — AB (ref 70–99)
Potassium: 3.7 mmol/L (ref 3.5–5.1)
SODIUM: 131 mmol/L — AB (ref 135–145)
Total Bilirubin: 1.1 mg/dL (ref 0.3–1.2)
Total Protein: 7 g/dL (ref 6.5–8.1)

## 2018-10-06 LAB — CBC WITH DIFFERENTIAL (CANCER CENTER ONLY)
Abs Immature Granulocytes: 0 10*3/uL (ref 0.00–0.07)
Basophils Absolute: 0 10*3/uL (ref 0.0–0.1)
Basophils Relative: 0 %
Eosinophils Absolute: 0.1 10*3/uL (ref 0.0–0.5)
Eosinophils Relative: 4 %
HEMATOCRIT: 32.1 % — AB (ref 36.0–46.0)
Hemoglobin: 11.4 g/dL — ABNORMAL LOW (ref 12.0–15.0)
Lymphocytes Relative: 60 %
Lymphs Abs: 0.8 10*3/uL (ref 0.7–4.0)
MCH: 33.6 pg (ref 26.0–34.0)
MCHC: 35.5 g/dL (ref 30.0–36.0)
MCV: 94.7 fL (ref 80.0–100.0)
Metamyelocytes Relative: 2 %
Monocytes Absolute: 0.2 10*3/uL (ref 0.1–1.0)
Monocytes Relative: 15 %
Neutro Abs: 0.3 10*3/uL — CL (ref 1.7–17.7)
Neutrophils Relative %: 19 %
Platelet Count: 132 10*3/uL — ABNORMAL LOW (ref 150–400)
RBC: 3.39 MIL/uL — ABNORMAL LOW (ref 3.87–5.11)
RDW: 11.5 % (ref 11.5–15.5)
WBC Count: 1.4 10*3/uL — ABNORMAL LOW (ref 4.0–10.5)
nRBC: 0 % (ref 0.0–0.2)

## 2018-10-06 MED ORDER — HEPARIN SOD (PORK) LOCK FLUSH 100 UNIT/ML IV SOLN
500.0000 [IU] | Freq: Once | INTRAVENOUS | Status: AC
  Administered 2018-10-06: 500 [IU]
  Filled 2018-10-06: qty 5

## 2018-10-06 MED ORDER — DIPHENOXYLATE-ATROPINE 2.5-0.025 MG PO TABS: 1.0000 | ORAL_TABLET | Freq: Four times a day (QID) | ORAL | 3 refills | Status: DC | PRN

## 2018-10-06 MED ORDER — SODIUM CHLORIDE 0.9% FLUSH
10.0000 mL | Freq: Once | INTRAVENOUS | Status: AC
  Administered 2018-10-06: 10 mL
  Filled 2018-10-06: qty 10

## 2018-10-06 NOTE — Progress Notes (Signed)
We will reduce neck cycle of chemotherapy because of ANC of 0.3

## 2018-10-06 NOTE — Assessment & Plan Note (Signed)
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 Neoadjuvant chemotherapy with TCH Perjeta 6 cycles followed by Herceptin maintenance for 1 year 2. Followed by mastectomy with sentinel lymph node study 3. Followed by adjuvant radiation therapy  ---------------------------------------------------------------  CT chest abdomen pelvis and bone scan: 09/28/2018: 3 mm lung nodule and 1 cm liver subcapsular lesion We will get an MRI of the liver.  The lung nodule will need a CT in 3 months. She had a bone lesion on the bone scan which is related to her knee replacement surgery and does not need any additional work-up. ------------------------------------------------------------------- Current treatment: Cycle 1 day 8 neoadjuvant TCH Perjeta Chemo toxicities:  Labs have been reviewed Return to clinic in 2 weeks for cycle 2

## 2018-10-07 ENCOUNTER — Telehealth: Payer: Self-pay | Admitting: Hematology and Oncology

## 2018-10-07 NOTE — Telephone Encounter (Signed)
No los °

## 2018-10-08 ENCOUNTER — Other Ambulatory Visit: Payer: Self-pay | Admitting: Family Medicine

## 2018-10-08 ENCOUNTER — Telehealth: Payer: Self-pay | Admitting: *Deleted

## 2018-10-08 DIAGNOSIS — C50412 Malignant neoplasm of upper-outer quadrant of left female breast: Secondary | ICD-10-CM

## 2018-10-08 DIAGNOSIS — Z170 Estrogen receptor positive status [ER+]: Principal | ICD-10-CM

## 2018-10-08 NOTE — Telephone Encounter (Signed)
"  My wife received her first chemotherapy [facility]) last Wednesday (09-30-2018), diarrhea started Monday (10-05-2018).  Whenever she tries to drink, it comes right out.  She's having a bad time.  Vomited last yesterday afternoon." "[name].  He told me I'd feel better by Wednesday but it's Thursday.  About ten stools so far today.  Volume about a half cup.  Last one was a little thicker.  Took Lomitol at 8:00 am, 12:00 am and Imodium at 10:00 am.  Have drunk a total of maybe 20 oz of water and Propel.  This is new and we don't know.  Is this normal to have this many bowel movements?"    A.P.P notified patient reports drinking ~ 20 oz (2.5 Cups) today with ~ 40 oz (5 Cups) liquid diarrhea out.  First TCHP on 09-30-2018.  Verbal orders received and read back from Wilber Bihari, NP for [facility] visit, IVF today.  May have further needs for diarrhea  [facility] notified.  No accommodations found at this time.  Verbal order received and read back from Sandi Mealy PA for tomorrow am with CBC, CMP and Mg Labs.  [name] to force fluids, continue Lomotil and Imodium as ordered.  Report to ED if any dizziness or feeling lightheaded, faint or any progression or distress.  Patient and spouse provided orders and instructions to manage today.  If any progression to report to ED despite F/U schedule tomorrow.   "We don't suspect things will improve but will call 8:00 am tomorrow to cancel if symptoms subside."

## 2018-10-09 ENCOUNTER — Inpatient Hospital Stay (HOSPITAL_BASED_OUTPATIENT_CLINIC_OR_DEPARTMENT_OTHER): Payer: Medicare Other | Admitting: Medical

## 2018-10-09 ENCOUNTER — Inpatient Hospital Stay: Payer: Medicare Other

## 2018-10-09 ENCOUNTER — Observation Stay (HOSPITAL_COMMUNITY)
Admission: AD | Admit: 2018-10-09 | Discharge: 2018-10-11 | Disposition: A | Discharge status: Home / Self Care | Payer: Medicare Other | Source: Ambulatory Visit | Attending: Internal Medicine | Admitting: Internal Medicine

## 2018-10-09 ENCOUNTER — Other Ambulatory Visit: Payer: Self-pay

## 2018-10-09 ENCOUNTER — Encounter (HOSPITAL_COMMUNITY): Payer: Self-pay

## 2018-10-09 VITALS — BP 136/63 | HR 91 | Temp 97.6°F | Resp 18 | Ht 62.0 in | Wt 163.6 lb

## 2018-10-09 DIAGNOSIS — N179 Acute kidney failure, unspecified: Principal | ICD-10-CM

## 2018-10-09 DIAGNOSIS — Z79899 Other long term (current) drug therapy: Secondary | ICD-10-CM | POA: Diagnosis not present

## 2018-10-09 DIAGNOSIS — C50412 Malignant neoplasm of upper-outer quadrant of left female breast: Secondary | ICD-10-CM

## 2018-10-09 DIAGNOSIS — Z8249 Family history of ischemic heart disease and other diseases of the circulatory system: Secondary | ICD-10-CM | POA: Insufficient documentation

## 2018-10-09 DIAGNOSIS — Z881 Allergy status to other antibiotic agents status: Secondary | ICD-10-CM | POA: Diagnosis not present

## 2018-10-09 DIAGNOSIS — E871 Hypo-osmolality and hyponatremia: Secondary | ICD-10-CM | POA: Insufficient documentation

## 2018-10-09 DIAGNOSIS — D72819 Decreased white blood cell count, unspecified: Secondary | ICD-10-CM | POA: Diagnosis not present

## 2018-10-09 DIAGNOSIS — E860 Dehydration: Secondary | ICD-10-CM

## 2018-10-09 DIAGNOSIS — Z833 Family history of diabetes mellitus: Secondary | ICD-10-CM | POA: Insufficient documentation

## 2018-10-09 DIAGNOSIS — R531 Weakness: Secondary | ICD-10-CM | POA: Diagnosis not present

## 2018-10-09 DIAGNOSIS — Z888 Allergy status to other drugs, medicaments and biological substances status: Secondary | ICD-10-CM | POA: Insufficient documentation

## 2018-10-09 DIAGNOSIS — M1990 Unspecified osteoarthritis, unspecified site: Secondary | ICD-10-CM | POA: Diagnosis not present

## 2018-10-09 DIAGNOSIS — E1122 Type 2 diabetes mellitus with diabetic chronic kidney disease: Secondary | ICD-10-CM | POA: Insufficient documentation

## 2018-10-09 DIAGNOSIS — N183 Chronic kidney disease, stage 3 (moderate): Secondary | ICD-10-CM | POA: Diagnosis not present

## 2018-10-09 DIAGNOSIS — E876 Hypokalemia: Secondary | ICD-10-CM | POA: Insufficient documentation

## 2018-10-09 DIAGNOSIS — C50912 Malignant neoplasm of unspecified site of left female breast: Secondary | ICD-10-CM | POA: Insufficient documentation

## 2018-10-09 DIAGNOSIS — Z886 Allergy status to analgesic agent status: Secondary | ICD-10-CM | POA: Insufficient documentation

## 2018-10-09 DIAGNOSIS — R8281 Pyuria: Secondary | ICD-10-CM | POA: Diagnosis not present

## 2018-10-09 DIAGNOSIS — E785 Hyperlipidemia, unspecified: Secondary | ICD-10-CM | POA: Diagnosis not present

## 2018-10-09 DIAGNOSIS — Z170 Estrogen receptor positive status [ER+]: Secondary | ICD-10-CM

## 2018-10-09 DIAGNOSIS — Z7984 Long term (current) use of oral hypoglycemic drugs: Secondary | ICD-10-CM | POA: Insufficient documentation

## 2018-10-09 DIAGNOSIS — R197 Diarrhea, unspecified: Secondary | ICD-10-CM

## 2018-10-09 DIAGNOSIS — D72829 Elevated white blood cell count, unspecified: Secondary | ICD-10-CM | POA: Diagnosis not present

## 2018-10-09 DIAGNOSIS — I129 Hypertensive chronic kidney disease with stage 1 through stage 4 chronic kidney disease, or unspecified chronic kidney disease: Secondary | ICD-10-CM | POA: Insufficient documentation

## 2018-10-09 DIAGNOSIS — Z9882 Breast implant status: Secondary | ICD-10-CM | POA: Insufficient documentation

## 2018-10-09 DIAGNOSIS — Z853 Personal history of malignant neoplasm of breast: Secondary | ICD-10-CM | POA: Diagnosis not present

## 2018-10-09 DIAGNOSIS — D649 Anemia, unspecified: Secondary | ICD-10-CM | POA: Diagnosis not present

## 2018-10-09 DIAGNOSIS — R112 Nausea with vomiting, unspecified: Secondary | ICD-10-CM

## 2018-10-09 DIAGNOSIS — Z5112 Encounter for antineoplastic immunotherapy: Secondary | ICD-10-CM | POA: Diagnosis not present

## 2018-10-09 DIAGNOSIS — Z95828 Presence of other vascular implants and grafts: Secondary | ICD-10-CM

## 2018-10-09 DIAGNOSIS — E119 Type 2 diabetes mellitus without complications: Secondary | ICD-10-CM | POA: Diagnosis not present

## 2018-10-09 DIAGNOSIS — Z5111 Encounter for antineoplastic chemotherapy: Secondary | ICD-10-CM | POA: Diagnosis not present

## 2018-10-09 LAB — CBC WITH DIFFERENTIAL (CANCER CENTER ONLY)
Abs Immature Granulocytes: 0.8 10*3/uL — ABNORMAL HIGH (ref 0.00–0.07)
Basophils Absolute: 0 10*3/uL (ref 0.0–0.1)
Basophils Relative: 0 %
Eosinophils Absolute: 0 10*3/uL (ref 0.0–0.5)
Eosinophils Relative: 0 %
HEMATOCRIT: 30.6 % — AB (ref 36.0–46.0)
Hemoglobin: 11.3 g/dL — ABNORMAL LOW (ref 12.0–15.0)
Immature Granulocytes: 5 %
Lymphocytes Relative: 9 %
Lymphs Abs: 1.4 10*3/uL (ref 0.7–4.0)
MCH: 33.8 pg (ref 26.0–34.0)
MCHC: 36.9 g/dL — ABNORMAL HIGH (ref 30.0–36.0)
MCV: 91.6 fL (ref 80.0–100.0)
Monocytes Absolute: 1.9 10*3/uL — ABNORMAL HIGH (ref 0.1–1.0)
Monocytes Relative: 12 %
Neutro Abs: 11.3 10*3/uL — ABNORMAL HIGH (ref 1.7–7.7)
Neutrophils Relative %: 74 %
Platelet Count: 175 10*3/uL (ref 150–400)
RBC: 3.34 MIL/uL — AB (ref 3.87–5.11)
RDW: 11.7 % (ref 11.5–15.5)
WBC Count: 15.5 10*3/uL — ABNORMAL HIGH (ref 4.0–10.5)
nRBC: 0 % (ref 0.0–0.2)

## 2018-10-09 LAB — CBC
HCT: 28.2 % — ABNORMAL LOW (ref 36.0–46.0)
Hemoglobin: 9.7 g/dL — ABNORMAL LOW (ref 12.0–15.0)
MCH: 32.7 pg (ref 26.0–34.0)
MCHC: 34.4 g/dL (ref 30.0–36.0)
MCV: 94.9 fL (ref 80.0–100.0)
Platelets: 148 10*3/uL — ABNORMAL LOW (ref 150–400)
RBC: 2.97 MIL/uL — ABNORMAL LOW (ref 3.87–5.11)
RDW: 11.9 % (ref 11.5–15.5)
WBC: 11.6 10*3/uL — ABNORMAL HIGH (ref 4.0–10.5)
nRBC: 0 % (ref 0.0–0.2)

## 2018-10-09 LAB — URINALYSIS, ROUTINE W REFLEX MICROSCOPIC
Bilirubin Urine: NEGATIVE
GLUCOSE, UA: NEGATIVE mg/dL
Ketones, ur: NEGATIVE mg/dL
NITRITE: NEGATIVE
Protein, ur: NEGATIVE mg/dL
Specific Gravity, Urine: 1.006 (ref 1.005–1.030)
pH: 5 (ref 5.0–8.0)

## 2018-10-09 LAB — CREATININE, SERUM
Creatinine, Ser: 3 mg/dL — ABNORMAL HIGH (ref 0.44–1.00)
GFR calc Af Amer: 17 mL/min — ABNORMAL LOW (ref >60)
GFR calc non Af Amer: 15 mL/min — ABNORMAL LOW (ref >60)

## 2018-10-09 LAB — CMP (CANCER CENTER ONLY)
ALT: 23 U/L (ref 0–44)
AST: 18 U/L (ref 15–41)
Albumin: 3.8 g/dL (ref 3.5–5.0)
Alkaline Phosphatase: 88 U/L (ref 38–126)
Anion gap: 15 (ref 5–15)
BUN: 50 mg/dL — ABNORMAL HIGH (ref 8–23)
CO2: 20 mmol/L — ABNORMAL LOW (ref 22–32)
Calcium: 9.1 mg/dL (ref 8.9–10.3)
Chloride: 90 mmol/L — ABNORMAL LOW (ref 98–111)
Creatinine: 3.78 mg/dL (ref 0.44–1.00)
GFR, EST NON AFRICAN AMERICAN: 11 mL/min — AB (ref >60)
GFR, Est AFR Am: 13 mL/min — ABNORMAL LOW (ref >60)
Glucose, Bld: 145 mg/dL — ABNORMAL HIGH (ref 70–99)
Potassium: 3.4 mmol/L — ABNORMAL LOW (ref 3.5–5.1)
Sodium: 125 mmol/L — ABNORMAL LOW (ref 135–145)
Total Bilirubin: 0.5 mg/dL (ref 0.3–1.2)
Total Protein: 7.3 g/dL (ref 6.5–8.1)

## 2018-10-09 LAB — BASIC METABOLIC PANEL
ANION GAP: 11 (ref 5–15)
BUN: 50 mg/dL — ABNORMAL HIGH (ref 8–23)
CO2: 19 mmol/L — ABNORMAL LOW (ref 22–32)
Calcium: 7.8 mg/dL — ABNORMAL LOW (ref 8.9–10.3)
Chloride: 99 mmol/L (ref 98–111)
Creatinine, Ser: 3 mg/dL — ABNORMAL HIGH (ref 0.44–1.00)
GFR calc Af Amer: 17 mL/min — ABNORMAL LOW (ref >60)
GFR calc non Af Amer: 15 mL/min — ABNORMAL LOW (ref >60)
Glucose, Bld: 132 mg/dL — ABNORMAL HIGH (ref 70–99)
Potassium: 3.3 mmol/L — ABNORMAL LOW (ref 3.5–5.1)
Sodium: 129 mmol/L — ABNORMAL LOW (ref 135–145)

## 2018-10-09 LAB — MAGNESIUM
Magnesium: 1.4 mg/dL — CL (ref 1.7–2.4)
Magnesium: 2.7 mg/dL — ABNORMAL HIGH (ref 1.7–2.4)

## 2018-10-09 LAB — GLUCOSE, CAPILLARY: Glucose-Capillary: 159 mg/dL — ABNORMAL HIGH (ref 70–99)

## 2018-10-09 MED ORDER — DIPHENOXYLATE-ATROPINE 2.5-0.025 MG PO TABS
1.0000 | ORAL_TABLET | Freq: Four times a day (QID) | ORAL | Status: DC | PRN
  Administered 2018-10-09: 1 via ORAL
  Filled 2018-10-09: qty 1

## 2018-10-09 MED ORDER — LACTATED RINGERS IV SOLN
INTRAVENOUS | Status: AC
  Administered 2018-10-09 – 2018-10-10 (×3): via INTRAVENOUS

## 2018-10-09 MED ORDER — LORATADINE 10 MG PO TABS
10.0000 mg | ORAL_TABLET | Freq: Every day | ORAL | Status: DC
  Administered 2018-10-09 – 2018-10-11 (×3): 10 mg via ORAL
  Filled 2018-10-09 (×3): qty 1

## 2018-10-09 MED ORDER — DIPHENOXYLATE-ATROPINE 2.5-0.025 MG PO TABS
ORAL_TABLET | ORAL | Status: AC
  Filled 2018-10-09: qty 1

## 2018-10-09 MED ORDER — ONDANSETRON HCL 4 MG/2ML IJ SOLN
4.0000 mg | Freq: Once | INTRAMUSCULAR | Status: AC
  Administered 2018-10-09: 4 mg via INTRAVENOUS

## 2018-10-09 MED ORDER — DIPHENOXYLATE-ATROPINE 2.5-0.025 MG PO TABS
1.0000 | ORAL_TABLET | Freq: Once | ORAL | Status: AC
  Administered 2018-10-09: 1 via ORAL

## 2018-10-09 MED ORDER — DIPHENOXYLATE-ATROPINE 2.5-0.025 MG PO TABS
1.0000 | ORAL_TABLET | Freq: Four times a day (QID) | ORAL | Status: DC | PRN
  Administered 2018-10-09 – 2018-10-11 (×5): 1 via ORAL
  Filled 2018-10-09 (×5): qty 1

## 2018-10-09 MED ORDER — ONDANSETRON HCL 4 MG/2ML IJ SOLN
INTRAMUSCULAR | Status: AC
  Filled 2018-10-09: qty 2

## 2018-10-09 MED ORDER — ONDANSETRON HCL 8 MG PO TABS
8.0000 mg | ORAL_TABLET | Freq: Two times a day (BID) | ORAL | Status: DC | PRN
  Administered 2018-10-09: 8 mg via ORAL
  Filled 2018-10-09: qty 1

## 2018-10-09 MED ORDER — INSULIN ASPART 100 UNIT/ML ~~LOC~~ SOLN
0.0000 [IU] | Freq: Three times a day (TID) | SUBCUTANEOUS | Status: DC
  Administered 2018-10-10 – 2018-10-11 (×4): 1 [IU] via SUBCUTANEOUS

## 2018-10-09 MED ORDER — SODIUM CHLORIDE 0.9% FLUSH
10.0000 mL | Freq: Once | INTRAVENOUS | Status: AC
  Administered 2018-10-09: 10 mL
  Filled 2018-10-09: qty 10

## 2018-10-09 MED ORDER — SODIUM CHLORIDE 0.9 % IV SOLN
Freq: Once | INTRAVENOUS | Status: AC
  Administered 2018-10-09: 12:00:00 via INTRAVENOUS
  Filled 2018-10-09: qty 1000

## 2018-10-09 MED ORDER — PROCHLORPERAZINE MALEATE 10 MG PO TABS: 10.0000 mg | ORAL_TABLET | Freq: Four times a day (QID) | ORAL | Status: DC | PRN

## 2018-10-09 MED ORDER — LOPERAMIDE HCL 2 MG PO CAPS
2.0000 mg | ORAL_CAPSULE | ORAL | Status: DC | PRN
  Administered 2018-10-09 – 2018-10-10 (×4): 2 mg via ORAL
  Filled 2018-10-09 (×4): qty 1

## 2018-10-09 MED ORDER — HEPARIN SODIUM (PORCINE) 5000 UNIT/ML IJ SOLN
5000.0000 [IU] | Freq: Three times a day (TID) | INTRAMUSCULAR | Status: DC
  Administered 2018-10-09 – 2018-10-11 (×5): 5000 [IU] via SUBCUTANEOUS
  Filled 2018-10-09 (×6): qty 1

## 2018-10-09 MED ORDER — LORAZEPAM 0.5 MG PO TABS: 0.5000 mg | ORAL_TABLET | Freq: Every evening | ORAL | Status: DC | PRN

## 2018-10-09 MED ORDER — INSULIN ASPART 100 UNIT/ML ~~LOC~~ SOLN: 0.0000 [IU] | Freq: Every day | SUBCUTANEOUS | Status: DC

## 2018-10-09 MED ORDER — SODIUM CHLORIDE 0.9 % IV SOLN
Freq: Once | INTRAVENOUS | Status: AC
  Administered 2018-10-09: 10:00:00 via INTRAVENOUS
  Filled 2018-10-09: qty 250

## 2018-10-09 MED ORDER — ATORVASTATIN CALCIUM 10 MG PO TABS
10.0000 mg | ORAL_TABLET | Freq: Every day | ORAL | Status: DC
  Administered 2018-10-09 – 2018-10-10 (×2): 10 mg via ORAL
  Filled 2018-10-09 (×3): qty 1

## 2018-10-09 NOTE — Patient Instructions (Signed)

## 2018-10-09 NOTE — Progress Notes (Signed)
[facility] Progress Note   Katie [name] [id number] 07-Feb-1946 73 y.o.  Katie [name] is managed by Dr. [name]  Actively treated with chemotherapy/immunotherapy/hormonal therapy: yes  Current Therapy: Herceptin, Perjeta, Taxotere, and carboplatin with the Udenyca support  Last Treated:   09/30/2018 (cycle 1, day 1) Udenyca dosed on 10/02/2018.  Assessment: Plan:    Dehydration - Plan: 0.9 %  sodium chloride infusion, sodium chloride 0.9 % 1,000 mL with potassium chloride 20 mEq, magnesium sulfate 4 g infusion  Diarrhea, unspecified type - Plan: diphenoxylate-atropine (LOMOTIL) 2.5-0.025 MG per tablet 1 tablet, sodium chloride 0.9 % 1,000 mL with potassium chloride 20 mEq, magnesium sulfate 4 g infusion  Non-intractable vomiting with nausea, unspecified vomiting type - Plan: ondansetron (ZOFRAN) injection 4 mg, sodium chloride 0.9 % 1,000 mL with potassium chloride 20 mEq, magnesium sulfate 4 g infusion  Malignant neoplasm of upper-outer quadrant of left breast in female, estrogen receptor positive (HCC)  Acute renal injury ([place])  Hyponatremia  Hypokalemia  Hypomagnesemia   Dehydration, diarrhea, nausea and vomiting, acute renal injury, hyponatremia, hypokalemia, and hypomagnesemia: Katie [name] is status post her first cycle of chemotherapy as noted above.  Her labs returned today showing a magnesium of 1.4, sodium of 125, potassium of 3.4, and a creatinine of 3.78 which was up from 1.07 when last checked 3 days ago.  She was started on 1 L of normal saline which was infused over 1 hour.  She was given Zofran 4 mg IV and given Lomotil 1 tablet p.o. x1.  After finishing her liter saline she was begun on 1 L of saline which included 4 g of magnesium and 20 mEq of potassium.  This is scheduled to be infused over 2 hours.  Dr. Erling Cruz, Brooke Bonito. has graciously agreed to admit this patient for IV hydration and to follow her labs.  The patient appears to be  improving as she has not had diarrhea since early this morning is currently eating broth and crackers.  ER positive malignant neoplasm of the left breast: Katie [name] is followed by Dr. [name] and is status post cycle 1 of Herceptin, Perjeta, Taxotere, and carboplatin which was dosed on 09/30/2018 with the Udenyca dosed on 10/02/2018.  Please see After Visit Summary for patient specific instructions.  Future Appointments  Date Time Provider [place]  10/13/2018 11:00 AM [facility] MR 2 [facility]315MRI [facility] [facility]  10/21/2018  8:00 AM [facility] LAB 6 [facility] None  10/21/2018  8:15 AM [facility] [place] FLUSH [facility] None  10/21/2018  8:30 AM [name], MD [facility] None  10/21/2018  9:00 AM [facility] INFUSION [facility] None  11/11/2018  8:30 AM [facility] LAB 6 [facility] None  11/11/2018  9:00 AM [facility] [place] None  11/11/2018  9:30 AM [name], MD [facility] None  11/11/2018 10:00 AM [facility] INFUSION [facility] None  01/05/2019  8:30 AM Pinson, Venetia Maxon, LPN [facility]  12/02/8117  3:30 PM Bedsole, Amy E, MD [facility]    No orders of the defined types were placed in this encounter.      Subjective:   Patient ID:  Katie [name] is a 73 y.o. (DOB Jul 11, 1946) female.  Chief Complaint:  Chief Complaint  Patient presents with  . Diarrhea    HPI Katie [name]   is a 73 year old female with an ER positive malignant neoplasm of the left breast who is managed by Dr. [name] and is status post cycle 1 of Herceptin,  Perjeta, Taxotere, and carboplatin which was dosed on 09/30/2018 with the Udenyca dosed on 10/02/2018.  She has had significant diarrhea since Monday with multiple watery and loose stools daily.  She has been using Imodium and Lomotil with out benefit.  She appears to be improving as she took her most recent dose of antidiarrheal medicines between 3 and 5 AM this morning.  She has had no additional episodes of diarrhea since  that time.  She continues to have nausea.  She had 3 small mouthfuls of scrambled eggs this morning.  She has had no vomiting since that time but continues to have nausea.  She was given Zofran for milligrams IV x1 and is currently able to eat chicken broth with crackers.  Her labs are completed this morning which returned showing a magnesium of 1.4, sodium of 125, potassium of 3.4, and a creatinine of 3.78 which was up from 1.07 when last checked 3 days ago.  She was started on 1 L of normal saline which was infused over 1 hour.  She was given Zofran 4 mg IV and given Lomotil 1 tablet p.o. x1.  After finishing her liter saline she was begun on 1 L of saline which included 4 g of magnesium and 20 mEq of potassium.  This is scheduled to be infused over 2 hours.  She has no history of kidney stones or renal dysfunction.  Dr. Erling Cruz, Brooke Bonito. has graciously agreed to admit this patient for IV hydration and to follow her labs.   Medications: I have reviewed the patient's current medications.  Allergies:  Allergies  Allergen Reactions  . Erythromycin Anaphylaxis    REACTION: Rash, hives  . Ciprofloxacin Rash    REACTION: rash REACTION: rash  . Daypro [Oxaprozin]   . Enalapril Maleate Nausea Only  . Nsaids Nausea Only  . Vioxx [Rofecoxib]     Past Medical History:  Diagnosis Date  . Allergy   . Anemia   . Blood transfusion without reported diagnosis   . Diverticulitis   . DJD (degenerative joint disease)   . DM type 2 (diabetes mellitus, type 2) ([place])   . Family history of breast cancer   . Fibrocystic breast disease   . GERD (gastroesophageal reflux disease)   . History of chicken pox   . Hypercholesteremia   . Hypertension   . Lichen planopilaris   . Urinary incontinence     Past Surgical History:  Procedure Laterality Date  . BREAST SURGERY  02/1975   Left Breast Biopsy-Fibrocystic Disease  . BREAST SURGERY  10/1981   Bialteral Capsulectomy Breast Surgery  . BREAST SURGERY   07/1998   Replace Bilateral Silicone Implants  . BREAST SURGERY  01/1976   Bilater breast surgery to remove fibrocystic tissue and replace with silicone implants  . CHOLECYSTECTOMY N/A 07/08/2016   Procedure: LAPAROSCOPIC CHOLECYSTECTOMY;  Surgeon: [name], MD;  Location: [facility] ORS;  Service: General;  Laterality: N/A;  . COLONOSCOPY WITH PROPOFOL N/A 11/07/2017   Procedure: COLONOSCOPY WITH PROPOFOL;  Surgeon: Jonathon Bellows, MD;  Location: [facility] ENDOSCOPY;  Service: Gastroenterology;  Laterality: N/A;  . DILATION AND CURETTAGE OF UTERUS  01/1994  . ESOPHAGOGASTRODUODENOSCOPY (EGD) WITH PROPOFOL N/A 12/05/2016   Procedure: ESOPHAGOGASTRODUODENOSCOPY (EGD) WITH PROPOFOL;  Surgeon: Jonathon Bellows, MD;  Location: [facility] ENDOSCOPY;  Service: Endoscopy;  Laterality: N/A;  . FINGER SURGERY  2001 & 2003   Trigger Finger  . FOOT SURGERY  1980   To Relieve Pinched Nerve  . FOOT SURGERY  07/2008   Left Foot-Plantar Fasciitis  . JOINT REPLACEMENT  04/19/2013   Hip Replacement-Right  . PLACEMENT OF BREAST IMPLANTS  12/26/2004   Replace Failed Implants  . RHINOPLASTY  03/1964   To Correct Deviated Septum    Family History  Problem Relation Age of Onset  . Arthritis Mother   . Hypertension Mother   . Heart disease Father   . Diabetes Father   . Breast cancer Sister 32       d. 15  . Hypertension Brother   . Leukemia Brother   . Coronary artery disease Brother     Social History   Socioeconomic History  . Marital status: Married    Spouse name: Not on file  . Number of children: 1  . Years of education: Not on file  . Highest education level: Not on file  Occupational History  . Occupation: Retired  Scientific laboratory technician  . Financial resource strain: Not on file  . Food insecurity:    Worry: Not on file    Inability: Not on file  . Transportation needs:    Medical: Not on file    Non-medical: Not on file  Tobacco Use  . Smoking status: Never Smoker  . Smokeless tobacco: Never Used    Substance and Sexual Activity  . Alcohol use: No    Alcohol/week: 0.0 standard drinks  . Drug use: No  . Sexual activity: Never    Partners: Male  Lifestyle  . Physical activity:    Days per week: Not on file    Minutes per session: Not on file  . Stress: Not on file  Relationships  . Social connections:    Talks on phone: Not on file    Gets together: Not on file    Attends religious service: Not on file    Active member of club or organization: Not on file    Attends meetings of clubs or organizations: Not on file    Relationship status: Not on file  . Intimate partner violence:    Fear of current or ex partner: Not on file    Emotionally abused: Not on file    Physically abused: Not on file    Forced sexual activity: Not on file  Other Topics Concern  . Not on file  Social History Narrative   Married for 37 years.    She has one child and one stepston.       Past Medical History, Surgical history, Social history, and Family history were reviewed and updated as appropriate.   Please see review of systems for further details on the patient's review from today.   Review of Systems:  Review of Systems  Constitutional: Negative for appetite change, chills, diaphoresis and fever.  Respiratory: Negative for cough, chest tightness and shortness of breath.   Cardiovascular: Negative for chest pain, palpitations and leg swelling.  Gastrointestinal: Positive for diarrhea and nausea. Negative for abdominal distention, abdominal pain, blood in stool, constipation and vomiting.  Genitourinary: Negative for decreased urine volume and difficulty urinating.  Neurological: Negative for dizziness, seizures, speech difficulty, weakness and headaches.    Objective:   Physical Exam:  BP 136/63 (BP Location: Left Arm, Patient Position: Sitting)   Pulse 91   Temp 97.6 F (36.4 C) (Oral)   Resp 18   Ht 5\' 2"  (1.575 m)   Wt 163 lb 9.6 oz (74.2 kg)   LMP 08/26/1992 (Approximate)    SpO2 95%   BMI 29.92 kg/m  ECOG: 1  Physical Exam Constitutional:      General: She is not in acute distress.    Appearance: She is not diaphoretic.  HENT:     Head: Normocephalic and atraumatic.     Mouth/Throat:     Mouth: Mucous membranes are dry.     Pharynx: Oropharynx is clear.  Cardiovascular:     Rate and Rhythm: Normal rate and regular rhythm.     Heart sounds: Normal heart sounds. No murmur. No friction rub. No gallop.      Comments: And accessed right chest wall Port-A-Cath is noted. Pulmonary:     Effort: Pulmonary effort is normal. No respiratory distress.     Breath sounds: Normal breath sounds. No stridor. No wheezing or rales.  Abdominal:     General: Bowel sounds are normal. There is no distension.     Palpations: Abdomen is soft. There is no mass.     Tenderness: There is no abdominal tenderness. There is no guarding or rebound.  Skin:    General: Skin is warm and dry.  Neurological:     Mental Status: She is alert.     Coordination: Coordination normal.     Gait: Gait normal.  Psychiatric:        Mood and Affect: Mood normal.        Behavior: Behavior normal.        Thought Content: Thought content normal.        Judgment: Judgment normal.     Lab Review:     Component Value Date/Time   NA 125 (L) 10/09/2018 0940   K 3.4 (L) 10/09/2018 0940   CL 90 (L) 10/09/2018 0940   CO2 20 (L) 10/09/2018 0940   GLUCOSE 145 (H) 10/09/2018 0940   BUN 50 (H) 10/09/2018 0940   CREATININE 3.78 (HH) 10/09/2018 0940   CALCIUM 9.1 10/09/2018 0940   PROT 7.3 10/09/2018 0940   ALBUMIN 3.8 10/09/2018 0940   AST 18 10/09/2018 0940   ALT 23 10/09/2018 0940   ALKPHOS 88 10/09/2018 0940   BILITOT 0.5 10/09/2018 0940   GFRNONAA 11 (L) 10/09/2018 0940   GFRAA 13 (L) 10/09/2018 0940       Component Value Date/Time   WBC 15.5 (H) 10/09/2018 0940   WBC 4.2 10/29/2016 0949   RBC 3.34 (L) 10/09/2018 0940   HGB 11.3 (L) 10/09/2018 0940   HGB 10.0 (L) 04/29/2013 0812     HCT 30.6 (L) 10/09/2018 0940   HCT 29.1 (L) 04/29/2013 0812   PLT 175 10/09/2018 0940   PLT 378 04/29/2013 0812   MCV 91.6 10/09/2018 0940   MCV 95 04/29/2013 0812   MCH 33.8 10/09/2018 0940   MCHC 36.9 (H) 10/09/2018 0940   RDW 11.7 10/09/2018 0940   RDW 15.5 (H) 04/29/2013 0812   LYMPHSABS 1.4 10/09/2018 0940   LYMPHSABS 1.4 04/29/2013 0812   MONOABS 1.9 (H) 10/09/2018 0940   MONOABS 0.7 04/29/2013 0812   EOSABS 0.0 10/09/2018 0940   EOSABS 0.4 04/29/2013 0812   BASOSABS 0.0 10/09/2018 0940   BASOSABS 0.1 04/29/2013 0812   -------------------------------  Imaging from last 24 hours (if applicable):  Radiology interpretation: Ct Chest W Contrast  Result Date: 09/28/2018 CLINICAL DATA:  New diagnosis of left breast cancer. Staging evaluation. EXAM: CT CHEST, ABDOMEN, AND PELVIS WITH CONTRAST TECHNIQUE: Multidetector CT imaging of the chest, abdomen and pelvis was performed following the standard protocol during bolus administration of intravenous contrast. CONTRAST:  122mL OMNIPAQUE IOHEXOL 300  MG/ML  SOLN COMPARISON:  10/29/2016 right upper quadrant abdominal sonogram. 07/01/2016 chest radiograph. 08/06/2012 CT abdomen/pelvis. FINDINGS: CT CHEST FINDINGS Cardiovascular: Normal heart size. No significant pericardial effusion/thickening. Right internal jugular Port-A-Cath terminates in the middle third of the SVC. Atherosclerotic nonaneurysmal thoracic aorta. Normal caliber pulmonary arteries. No central pulmonary emboli. Mediastinum/Nodes: No discrete thyroid nodules. Unremarkable esophagus. No pathologically enlarged axillary, mediastinal or hilar lymph nodes. Lungs/Pleura: No pneumothorax. No pleural effusion. Curvilinear opacity in the medial basilar right lower lobe (series 4/image 119) was present on 08/06/2012 CT abdomen study and is most compatible with mucoid impaction within a dilated distal bronchiole. Posterior left upper lobe 3 mm solid pulmonary nodule (series 4/image 40). No  acute consolidative airspace disease, lung masses or additional significant pulmonary nodules. Musculoskeletal: No aggressive appearing focal osseous lesions. Mild thoracic spondylosis. Intact appearing bilateral breast prostheses. CT ABDOMEN PELVIS FINDINGS Hepatobiliary: Normal liver size. Hypoechoic subcapsular 1.2 cm right liver lobe lesion adjacent to the gallbladder fossa (series 2/image 67), not definitely seen on 08/06/2012 CT abdomen study. No additional liver lesions. Cholecystectomy. No biliary ductal dilatation. Pancreas: Normal, with no mass or duct dilation. Spleen: Normal size. No mass. Adrenals/Urinary Tract: Normal adrenals. Scattered subcentimeter hypodense renal cortical lesions in both kidneys are too small to characterize and require no follow-up. No hydronephrosis. Nondistended bladder is obscured by streak artifact from right hip hardware, with no gross bladder abnormality. Stomach/Bowel: Normal non-distended stomach. Normal caliber small bowel with no small bowel wall thickening. Normal appendix. Mild diffuse colonic diverticulosis, most prominent in the sigmoid colon, with no large bowel wall thickening or significant pericolonic fat stranding. Vascular/Lymphatic: Atherosclerotic nonaneurysmal abdominal aorta. Patent portal, splenic, hepatic and renal veins. No pathologically enlarged lymph nodes in the abdomen or pelvis. Reproductive: Grossly normal uterus.  No adnexal mass. Other: No pneumoperitoneum, ascites or focal fluid collection. Musculoskeletal: No aggressive appearing focal osseous lesions. Right total hip arthroplasty. Minimal lumbar spondylosis. IMPRESSION: 1. No lymphadenopathy or other findings highly suspicious for metastatic disease in the chest, abdomen or pelvis. 2. Subcapsular small low-attenuation right liver lobe lesion adjacent to the gallbladder fossa, indeterminate. While potentially representing a benign focus of subcapsular fat, metastatic disease cannot be excluded  on the basis of this scan and further characterization is recommended with MRI abdomen without and with IV contrast at this time. 3. Solitary tiny left upper lobe solid pulmonary nodule, for which follow-up chest CT is recommended in 3 months. 4. Chronic findings include: Aortic Atherosclerosis (ICD10-I70.0). Mild diffuse colonic diverticulosis. Electronically Signed   By: Ilona Sorrel M.D.   On: 09/28/2018 15:22   Nm Bone Scan Whole Body  Result Date: 09/28/2018 CLINICAL DATA:  Breast cancer, staging EXAM: NUCLEAR MEDICINE WHOLE BODY BONE SCAN TECHNIQUE: Whole body anterior and posterior images were obtained approximately 3 hours after intravenous injection of radiopharmaceutical. RADIOPHARMACEUTICALS:  19.5 mCi Technetium-95m MDP IV COMPARISON:  None FINDINGS: Photopenic defects at LEFT knee and RIGHT hip from prior aunt joint replacements. Focal increased tracer accumulation at the proximal to mid LEFT tibial diaphysis, could be related to a long stemmed prosthesis, trauma, or metastasis; recommend radiographic correlation. Uptake at the shoulders, LEFT hip, LEFT elbow, RIGHT knee, feet, typically degenerative. Nonspecific tracer localization at the breasts bilaterally. Uptake at the lateral aspects of the cervical spine typically degenerative. Subtle increased tracer localization at the RIGHT lateral aspect of the thoracic spine at 2 levels, could be related to degenerative disease as well. Minimal uptake adjacent to the inferior aspect of the RIGHT hip joint,  potentially related to hip arthroplasty. No additional worrisome sites of osseous tracer accumulation. Expected urinary tract and soft tissue distribution of tracer. IMPRESSION: Uptake at the mid tibia, uncertain if related to a long stemmed tibial component of a knee prosthesis, trauma, or metastatic focus; recommend correlation with radiographs. The need prosthesis appears to be new since a 01/24/2018 MR. Additional scattered likely degenerative type  uptake without definite additional sites of osseous metastatic disease. Electronically Signed   By: Lavonia Dana M.D.   On: 09/28/2018 17:38   Ct Abdomen Pelvis W Contrast  Result Date: 09/28/2018 CLINICAL DATA:  New diagnosis of left breast cancer. Staging evaluation. EXAM: CT CHEST, ABDOMEN, AND PELVIS WITH CONTRAST TECHNIQUE: Multidetector CT imaging of the chest, abdomen and pelvis was performed following the standard protocol during bolus administration of intravenous contrast. CONTRAST:  188mL OMNIPAQUE IOHEXOL 300 MG/ML  SOLN COMPARISON:  10/29/2016 right upper quadrant abdominal sonogram. 07/01/2016 chest radiograph. 08/06/2012 CT abdomen/pelvis. FINDINGS: CT CHEST FINDINGS Cardiovascular: Normal heart size. No significant pericardial effusion/thickening. Right internal jugular Port-A-Cath terminates in the middle third of the SVC. Atherosclerotic nonaneurysmal thoracic aorta. Normal caliber pulmonary arteries. No central pulmonary emboli. Mediastinum/Nodes: No discrete thyroid nodules. Unremarkable esophagus. No pathologically enlarged axillary, mediastinal or hilar lymph nodes. Lungs/Pleura: No pneumothorax. No pleural effusion. Curvilinear opacity in the medial basilar right lower lobe (series 4/image 119) was present on 08/06/2012 CT abdomen study and is most compatible with mucoid impaction within a dilated distal bronchiole. Posterior left upper lobe 3 mm solid pulmonary nodule (series 4/image 40). No acute consolidative airspace disease, lung masses or additional significant pulmonary nodules. Musculoskeletal: No aggressive appearing focal osseous lesions. Mild thoracic spondylosis. Intact appearing bilateral breast prostheses. CT ABDOMEN PELVIS FINDINGS Hepatobiliary: Normal liver size. Hypoechoic subcapsular 1.2 cm right liver lobe lesion adjacent to the gallbladder fossa (series 2/image 67), not definitely seen on 08/06/2012 CT abdomen study. No additional liver lesions. Cholecystectomy. No  biliary ductal dilatation. Pancreas: Normal, with no mass or duct dilation. Spleen: Normal size. No mass. Adrenals/Urinary Tract: Normal adrenals. Scattered subcentimeter hypodense renal cortical lesions in both kidneys are too small to characterize and require no follow-up. No hydronephrosis. Nondistended bladder is obscured by streak artifact from right hip hardware, with no gross bladder abnormality. Stomach/Bowel: Normal non-distended stomach. Normal caliber small bowel with no small bowel wall thickening. Normal appendix. Mild diffuse colonic diverticulosis, most prominent in the sigmoid colon, with no large bowel wall thickening or significant pericolonic fat stranding. Vascular/Lymphatic: Atherosclerotic nonaneurysmal abdominal aorta. Patent portal, splenic, hepatic and renal veins. No pathologically enlarged lymph nodes in the abdomen or pelvis. Reproductive: Grossly normal uterus.  No adnexal mass. Other: No pneumoperitoneum, ascites or focal fluid collection. Musculoskeletal: No aggressive appearing focal osseous lesions. Right total hip arthroplasty. Minimal lumbar spondylosis. IMPRESSION: 1. No lymphadenopathy or other findings highly suspicious for metastatic disease in the chest, abdomen or pelvis. 2. Subcapsular small low-attenuation right liver lobe lesion adjacent to the gallbladder fossa, indeterminate. While potentially representing a benign focus of subcapsular fat, metastatic disease cannot be excluded on the basis of this scan and further characterization is recommended with MRI abdomen without and with IV contrast at this time. 3. Solitary tiny left upper lobe solid pulmonary nodule, for which follow-up chest CT is recommended in 3 months. 4. Chronic findings include: Aortic Atherosclerosis (ICD10-I70.0). Mild diffuse colonic diverticulosis. Electronically Signed   By: Ilona Sorrel M.D.   On: 09/28/2018 15:22        This case was discussed with  Dr. Burr Medico. She expressed agreement with my  management of this patient.  Taking wants to be a [employer] PA down in the cancer center and was getting a heads up about a patient that were get ready to bring up [place] she is 73 year old female who has a history of breast cancer is getting treated prior to like surgery so I think it is a curable cancer but she does get her first cycle of months

## 2018-10-09 NOTE — H&P (Signed)
**Note De-Identified vi Obfusction** History nd Physicl    TOPCIO CELL MWN:[id number] DOB: February 01, 1946 DO: 10/09/2018  PCP: Jinny Snders, MD  Ptient coming from: home  I hve personlly briefly reviewed ptient's old medicl records in [place]  Chief Complint: ki  HPI: DRIJN HROS is  73 y.o. femle with medicl history significnt of brest cncer, type 2 dibetes, reflux, hypertension, hyperlipidemi nd other medicl problems presenting with cute kidney injury in the setting of nuse, vomiting, nd dirrhe fter strting chemotherpy.   She notes her first round of chemo ws lst week.  Mondy nd Tuesdy she hd bd dirrhe s well s nuse nd vomiting.  She sw her oncologist this Wednesdy nd ws treted symptomticlly.  Unfortuntely she is continued to hve nuse, vomiting nd dirrhe.  She is lso hd decresed p.o. intke.  She ws seen in oncology clinic tody nd noted to hve cute kidney injury nd hypontremi.  She ws dmitted she ws sent to the hospitl for direct dmission for this reson.  She denies ny fevers, chills, cough, cold, chest pin, shortness of breth, dysuri.  She does note some dry mouth.  She denies smoking or lcohol use.  Review of Systems: s per HPI otherwise 10 point review of systems negtive.   Pst Medicl History:  Dignosis Dte  . llergy   . nemi   . Blood trnsfusion without reported dignosis   . Diverticulitis   . DJD (degenertive joint disese)   . DM type 2 (dibetes mellitus, type 2) ([place])   . Fmily history of brest cncer   . Fibrocystic brest disese   . GERD (gstroesophgel reflux disese)   . History of chicken pox   . Hypercholesteremi   . Hypertension   . Lichen plnopilris   . Urinry incontinence     Pst Surgicl History:  Procedure Lterlity Dte  . BREST SURGERY  02/1975   Left Brest Biopsy-Fibrocystic Disese  . BREST SURGERY  10/1981   Bilterl Cpsulectomy Brest Surgery  . BREST SURGERY   07/1998   Replce Bilterl Silicone Implnts  . BREST SURGERY  01/1976   Bilter brest surgery to remove fibrocystic tissue nd replce with silicone implnts  . CHOLECYSTECTOMY N/ 07/08/2016   Procedure: LPROSCOPIC CHOLECYSTECTOMY;  Surgeon: Clyburn Pert, MD;  Loction: [facility] ORS;  Service: Generl;  Lterlity: N/;  . COLONOSCOPY WITH PROPOFOL N/ 11/07/2017   Procedure: COLONOSCOPY WITH PROPOFOL;  Surgeon: Jonthon Bellows, MD;  Loction: [facility] ENDOSCOPY;  Service: Gstroenterology;  Lterlity: N/;  . DILTION ND CURETTGE OF UTERUS  01/1994  . ESOPHGOGSTRODUODENOSCOPY (EGD) WITH PROPOFOL N/ 12/05/2016   Procedure: ESOPHGOGSTRODUODENOSCOPY (EGD) WITH PROPOFOL;  Surgeon: Jonthon Bellows, MD;  Loction: [facility] ENDOSCOPY;  Service: Endoscopy;  Lterlity: N/;  . FINGER SURGERY  2001 & 2003   Trigger Finger  . FOOT SURGERY  1980   To Relieve Pinched Nerve  . FOOT SURGERY  07/2008   Left Foot-Plntr Fsciitis  . JOINT REPLCEMENT  04/19/2013   Hip Replcement-Right  . PLCEMENT OF BREST IMPLNTS  12/26/2004   Replce Filed Implnts  . RHINOPLSTY  03/1964   To Correct Devited Septum     reports tht she hs never smoked. She hs never used smokeless tobcco. She reports tht she does not drink lcohol or use drugs.  llergies  llergen Rections  . Erythromycin nphylxis    RECTION: Rsh, hives  . Ciprofloxcin Rsh    RECTION: rsh RECTION: rsh  . Dypro [Oxprozin]   . Enlpril Mlete Nuse **Note De-Identified vi Obfusction** Only  . Nsids Nuse Only  . Vioxx [Rofecoxib]     Fmily History  Problem Reltion Age of Onset  . Arthritis Mother   . Hypertension Mother   . Hert disese Fther   . Dibetes Fther   . Brest cncer Sister 32       d. 47  . Hypertension Brother   . Leukemi Brother   . Coronry rtery disese Brother    Prior to Admission medictions   Mediction Sig Strt Dte End Dte Tking? Authorizing Provider  cetminophen (TYLENOL) 325 MG tblet Tke 650 mg by mouth  every 6 (six) hours s needed for mild pin or hedche.   Yes Provider, Historicl, MD  torvsttin (LIPITOR) 10 MG tblet TAKE 1 TABLET(10 MG) BY MOUTH DAILY Ptient tking differently: Tke 10 mg by mouth dily t 6 PM.  10/08/18  Yes Bedsole, Amy E, MD  Azelstine HCl 137 MCG/SPRAY SOLN instill 1 spry into ech nostril twice  dy for 1 week then if needed Ptient tking differently: Plce 1 spry into the nose 2 (two) times dily s needed (congestion).  09/05/17  Yes Bedsole, Amy E, MD  choleclciferol (VITAMIN D) 400 units TABS tblet Tke 400 Units by mouth 2 (two) times dily.   Yes Provider, Historicl, MD  clobetsol (TEMOVATE) 0.05 % externl solution Apply 1 ppliction topiclly 2 (two) times dily s needed (sclp). Reported on 08/16/2015 06/02/14  Yes Provider, Historicl, MD  diphenoxylte-tropine (LOMOTIL) 2.5-0.025 MG tblet Tke 1 tblet by mouth 4 (four) times dily s needed for dirrhe or loose stools. 10/06/18  Yes Nichols Lose, MD  fluticsone (FLONASE) 50 MCG/ACT nsl spry instill 2 sprys into ech nostril once dily Ptient tking differently: Plce 2 sprys into both nostrils dily s needed for llergies.  09/05/17  Yes Bedsole, Amy E, MD  hydrochlorothizide (HYDRODIURIL) 25 MG tblet TAKE 1 TABLET(25 MG) BY MOUTH DAILY Ptient tking differently: Tke 25 mg by mouth dily.  10/08/18  Yes Bedsole, Amy E, MD  lidocine-prilocine (EMLA) crem Apply to ffected re once Ptient tking differently: Apply 1 ppliction topiclly s needed (port ccess).  09/16/18  Yes Nichols Lose, MD  lopermide (IMODIUM) 2 MG cpsule Tke 2 mg by mouth s needed for dirrhe or loose stools.   Yes Provider, Historicl, MD  lortdine (CLARITIN) 10 MG tblet Tke 10 mg by mouth dily.   Yes Provider, Historicl, MD  LORzepm (ATIVAN) 0.5 MG tblet Tke 1 tblet (0.5 mg totl) by mouth t bedtime s needed for sleep. 09/16/18  Yes Nichols Lose, MD  losrtn (COZAAR) 100 MG tblet TAKE 1  TABLET(100 MG) BY MOUTH DAILY Ptient tking differently: Tke 100 mg by mouth dily.  10/08/18  Yes Bedsole, Amy E, MD  metFORMIN (GLUCOPHAGE-XR) 500 MG 24 hr tblet Tke 1 tblet (500 mg totl) by mouth dily with brekfst. 09/29/18  Yes Bedsole, Amy E, MD  metroNIDAZOLE (METROCREAM) 0.75 % crem Apply 1 ppliction topiclly 2 (two) times dily s needed (rosce).    Yes Provider, Historicl, MD  Multiple Vitmin (MULTIVITAMIN WITH MINERALS) TABS tblet Tke 1 tblet by mouth dily.   Yes Provider, Historicl, MD  ondnsetron (ZOFRAN) 8 MG tblet Tke 1 tblet (8 mg totl) by mouth 2 (two) times dily s needed (Nuse or vomiting). Begin 4 dys fter chemotherpy. 09/16/18  Yes Nichols Lose, MD  prochlorperzine (COMPAZINE) 10 MG tblet Tke 1 tblet (10 mg totl) by mouth every 6 (six) hours s needed (Nuse or vomiting). 09/16/18  Yes Guden,  Vinay, MD  triamcinolone cream (KENLOG) 0.1 % pply 1 application topically 2 (two) times daily. Patient taking differently: pply 1 application topically 2 (two) times daily as needed (skin rashes).  03/17/18  Yes Bedsole, my E, MD  valCYclovir (VLTREX) 500 MG tablet Take 1 tablet (500 mg total) by mouth 2 (two) times daily. Patient taking differently: Take 500 mg by mouth as needed (flare up).  03/17/18  Yes Bedsole, my E, MD  glucose blood (FREESTYLE LITE) test strip Use to check blood sugar daily.  Dx: E11.9 10/17/17   [name], MD    Physical Exam: Vitals:   10/09/18 1541  BP: (!) 121/48  Pulse: 69  Resp: 16  Temp: (!) 97.5 F (36.4 C)  TempSrc: Oral  SpO2: 99%    Constitutional: ND, calm, comfortable Vitals:   10/09/18 1541  BP: (!) 121/48  Pulse: 69  Resp: 16  Temp: (!) 97.5 F (36.4 C)  TempSrc: Oral  SpO2: 99%   Eyes: PERRL, lids and conjunctivae normal ENMT: Mucous membranes are moist.  Respiratory: clear to auscultation bilaterally, no wheezing, no crackles. Normal respiratory effort. No accessory muscle use.    Cardiovascular: Regular rate and rhythm, no murmurs / rubs / gallops. No extremity edema.  bdomen: no tenderness, no masses palpated. No hepatosplenomegaly. Bowel sounds positive.  Musculoskeletal: no clubbing / cyanosis. No joint deformity upper and lower extremities. Good ROM, no contractures. Normal muscle tone.  Skin: no rashes, lesions, ulcers. No induration Neurologic: CN 2-12 grossly intact. Sensation intact  Psychiatric: Normal judgment and insight. lert and oriented x 3. Normal mood.   Labs on dmission: I have personally reviewed following labs and imaging studies  CBC: Recent Labs  Lab 10/06/18 1318 10/09/18 0940 10/09/18 1518  WBC 1.4* 15.5* 11.6*  NEUTROBS 0.3* 11.3*  --   HGB 11.4* 11.3* 9.7*  HCT 32.1* 30.6* 28.2*  MCV 94.7 91.6 94.9  PLT 132* 175 287*   Basic Metabolic Panel: Recent Labs  Lab 10/06/18 1318 10/09/18 0940 10/09/18 1518  N 131* 125* 129*  K 3.7 3.4* 3.3*  CL 95* 90* 99  CO2 26 20* 19*  GLUCOSE 137* 145* 132*  BUN 20 50* 50*  CRETININE 1.07* 3.78* 3.00*  3.00*  CLCIUM 9.6 9.1 7.8*  MG  --  1.4* 2.7*   GFR: Estimated Creatinine Clearance: 15.7 mL/min () (by C-G formula based on SCr of 3 mg/dL (H)). Liver Function Tests: Recent Labs  Lab 10/06/18 1318 10/09/18 0940  ST 18 18  LT 29 23  LKPHOS 73 88  BILITOT 1.1 0.5  PROT 7.0 7.3  LBUMIN 3.8 3.8   No results for input(s): LIPSE, MYLSE in the last 168 hours. No results for input(s): MMONI in the last 168 hours. Coagulation Profile: No results for input(s): INR, PROTIME in the last 168 hours. Cardiac Enzymes: No results for input(s): CKTOTL, CKMB, CKMBINDEX, TROPONINI in the last 168 hours. BNP (last 3 results) No results for input(s): PROBNP in the last 8760 hours. Hb1C: No results for input(s): HGB1C in the last 72 hours. CBG: No results for input(s): GLUCP in the last 168 hours. Lipid Profile: No results for input(s): CHOL, HDL, LDLCLC, TRIG, CHOLHDL,  LDLDIRECT in the last 72 hours. Thyroid Function Tests: No results for input(s): TSH, T4TOTL, FREET4, T3FREE, THYROIDB in the last 72 hours. nemia Panel: No results for input(s): VITMINB12, FOLTE, FERRITIN, TIBC, IRON, RETICCTPCT in the last 72 hours. Urine analysis:    Component Value Date/Time  BILIRUBINUR negative 10/11/2016 1113   PROTEINUR negative 10/11/2016 1113   UROBILINOGEN 0.2 10/11/2016 1113   NITRITE negative 10/11/2016 1113   LEUKOCYTESUR moderate (2+) () 10/11/2016 1113    Radiological Exams on dmission: No results found.  EKG: Independently reviewed. Sinus rhythm.  ppears similar to priors.  ssessment/Plan ctive Problems:   KI (acute kidney injury) ([place])  cute Kidney Injury: likely prerenal due to hypovolemia in the setting of nausea, vomiting, diarrhea.  Losartan and HCTZ also likely contributed.   S/p IVF in clinic Continue IVF Follow U Consider renal US, further w/u if not improving Holding HCTZ, losartan void nephrotoxins  Hyponatremia: 2/2 hypovolemia, HCTZ.  This was relatively acute as labs from 2/11 had sodium of 131.  Continue IVF Hold HCTZ  Nausea  Vomiting  Diarrhea: related to chemo, follow.  Treat symptomatically.  Hypomagnesemia: follow with repletion  Hypokalemia: mild, follow, replace as needed  Leukocytosis: pt received neulasta about 1 week ago.  lso, possible hemoconcentration.  No obvious infectious si/sx.   Follow  Hypertension: hold losartan and HCTZ due to aki  T2DM: hold metformin due to KI.  SSI.  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 Lindi die.  Her2 postiive, ER +, PR +.  Receiving neoadjuvant chemotherapy.  First round on 2/5.  DVT prophylaxis: heparin  Code Status: full  Family Communication: husband at bedside  Disposition Plan: pending improvement in renal function Consults called: none  dmission status: obs    Fayrene Helper MD [facility] Pager  [place]  If 7PM-7M, please contact night-coverage [email or web address] Password [facility]  10/09/2018, 6:26 PM

## 2018-10-09 NOTE — Progress Notes (Addendum)
Pt presents today with recent N/V/D after first chemotherapy treatment.  Reports not being able to keep anything down and general fatigue.  Afebrile.  Denies CP or SOB.  A&Ox4.  Denies dizziness or falls.  Splotchy red area above RAC where pt had an IV with dye injection, reports that it did itch and hurt earlier this week but improved with hydrocortisone cream as recommended by MD Lindi Adie.  PA Lucianne Lei aware.  Pt receive 1L NS IV today then 1L NS IV with potassium and magnesium.  Tolerated well, able to eat and drink.  Pt reports 1 episode diarrhea while in [facility] but states "it was a lot better" and urination.  Denies N/V.  VSS.  Pt taken by WC to 1618 with spouse and belongings.  Phone and in person report given to [employer].

## 2018-10-10 DIAGNOSIS — E1122 Type 2 diabetes mellitus with diabetic chronic kidney disease: Secondary | ICD-10-CM | POA: Diagnosis not present

## 2018-10-10 DIAGNOSIS — I129 Hypertensive chronic kidney disease with stage 1 through stage 4 chronic kidney disease, or unspecified chronic kidney disease: Secondary | ICD-10-CM | POA: Diagnosis not present

## 2018-10-10 DIAGNOSIS — N183 Chronic kidney disease, stage 3 (moderate): Secondary | ICD-10-CM | POA: Diagnosis not present

## 2018-10-10 DIAGNOSIS — C50912 Malignant neoplasm of unspecified site of left female breast: Secondary | ICD-10-CM | POA: Diagnosis not present

## 2018-10-10 DIAGNOSIS — E860 Dehydration: Secondary | ICD-10-CM | POA: Diagnosis not present

## 2018-10-10 DIAGNOSIS — N179 Acute kidney failure, unspecified: Secondary | ICD-10-CM | POA: Diagnosis not present

## 2018-10-10 LAB — COMPREHENSIVE METABOLIC PANEL
ALK PHOS: 70 U/L (ref 38–126)
ALT: 21 U/L (ref 0–44)
AST: 19 U/L (ref 15–41)
Albumin: 3 g/dL — ABNORMAL LOW (ref 3.5–5.0)
Anion gap: 9 (ref 5–15)
BUN: 40 mg/dL — ABNORMAL HIGH (ref 8–23)
CO2: 20 mmol/L — ABNORMAL LOW (ref 22–32)
Calcium: 8.1 mg/dL — ABNORMAL LOW (ref 8.9–10.3)
Chloride: 103 mmol/L (ref 98–111)
Creatinine, Ser: 1.95 mg/dL — ABNORMAL HIGH (ref 0.44–1.00)
GFR, EST AFRICAN AMERICAN: 29 mL/min — AB (ref >60)
GFR, EST NON AFRICAN AMERICAN: 25 mL/min — AB (ref >60)
Glucose, Bld: 116 mg/dL — ABNORMAL HIGH (ref 70–99)
Potassium: 3.3 mmol/L — ABNORMAL LOW (ref 3.5–5.1)
SODIUM: 132 mmol/L — AB (ref 135–145)
TOTAL PROTEIN: 5.4 g/dL — AB (ref 6.5–8.1)
Total Bilirubin: 0.5 mg/dL (ref 0.3–1.2)

## 2018-10-10 LAB — CBC
HCT: 25.2 % — ABNORMAL LOW (ref 36.0–46.0)
Hemoglobin: 8.7 g/dL — ABNORMAL LOW (ref 12.0–15.0)
MCH: 33.1 pg (ref 26.0–34.0)
MCHC: 34.5 g/dL (ref 30.0–36.0)
MCV: 95.8 fL (ref 80.0–100.0)
Platelets: 142 10*3/uL — ABNORMAL LOW (ref 150–400)
RBC: 2.63 MIL/uL — ABNORMAL LOW (ref 3.87–5.11)
RDW: 12.1 % (ref 11.5–15.5)
WBC: 11 10*3/uL — ABNORMAL HIGH (ref 4.0–10.5)
nRBC: 0 % (ref 0.0–0.2)

## 2018-10-10 LAB — GLUCOSE, CAPILLARY
Glucose-Capillary: 140 mg/dL — ABNORMAL HIGH (ref 70–99)
Glucose-Capillary: 125 mg/dL — ABNORMAL HIGH (ref 70–99)
Glucose-Capillary: 126 mg/dL — ABNORMAL HIGH (ref 70–99)
Glucose-Capillary: 133 mg/dL — ABNORMAL HIGH (ref 70–99)

## 2018-10-10 LAB — C DIFFICILE QUICK SCREEN W PCR REFLEX
C Diff antigen: NEGATIVE
C Diff interpretation: NOT DETECTED
C Diff toxin: NEGATIVE

## 2018-10-10 MED ORDER — LACTATED RINGERS IV SOLN
INTRAVENOUS | Status: DC
  Administered 2018-10-10: 21:00:00 via INTRAVENOUS

## 2018-10-10 MED ORDER — FLUTICASONE PROPIONATE 50 MCG/ACT NA SUSP
2.0000 | Freq: Every day | NASAL | Status: DC | PRN
  Administered 2018-10-10 – 2018-10-11 (×2): 2 via NASAL
  Filled 2018-10-10: qty 16

## 2018-10-10 MED ORDER — POTASSIUM CHLORIDE CRYS ER 20 MEQ PO TBCR
40.0000 meq | EXTENDED_RELEASE_TABLET | Freq: Once | ORAL | Status: AC
  Administered 2018-10-10: 40 meq via ORAL
  Filled 2018-10-10: qty 2

## 2018-10-10 NOTE — Progress Notes (Signed)
PROGRESS NOTE  Katie [name] KMM:[id number] DOB: Jan 21, 1946 DOA: 10/09/2018 PCP: [name], MD  HPI/Recap of past 24 hours: Katie [name] is a 73 y.o. female with medical history significant of breast cancer, type 2 diabetes, reflux, hypertension, hyperlipidemia and other medical problems presenting with acute kidney injury in the setting of nausea, vomiting, and diarrhea after starting chemotherapy.   She notes her first round of chemo was last week.  Monday and Tuesday she had bad diarrhea as well as nausea and vomiting.  She saw her oncologist this Wednesday and was treated symptomatically.  Unfortunately she is continued to have nausea, vomiting and diarrhea.  She is also had decreased p.o. intake.  She was seen in oncology clinic today and noted to have acute kidney injury and hyponatremia.  She was admitted she was sent to the hospital for direct admission for this reason.  She denies any fevers, chills, cough, cold, chest pain, shortness of breath, dysuria.  She does note some dry mouth.  She denies smoking or alcohol use.  10/10/18: Patient seen and examined at her bedside.  No acute events overnight.  Reports persistent diarrhea this morning.  Nausea is improving.  We will continue to hydrate with IV fluids.  Assessment/Plan: Active Problems:   AKI (acute kidney injury) ([place])  AKI on CKD 3 Prerenal from severe dehydration Baseline creatinine appears to be 1.07 from 10/06/2018 with GFR of 51 Presented with creatinine of 3.78 Renal function is improving Creatinine continues to trend down Creatinine today 10/10/2018 was 1.95 Continue IV fluid hydration Continue to monitor urine output Continue to avoid nephrotoxic agents/dehydration/hypotension Repeat BMP in the morning  Severe dehydration, improving with IV fluid hydration Management as stated above  Persistent diarrhea Obtain GI panel and C. difficile PCR  Hypokalemia Potassium 3.3 Repleted with oral KCl 40 mEq  once Last magnesium done on 10/09/2018 was 2.7  Hypo-volemic hyponatremia Improving with IV fluid hydration Sodium 132 Presented with sodium of 125 Continue to monitor  Mild metabolic acidosis secondary to acute kidney injury Improving Bicarb 20 from 19 Repeat chemistry panel in the morning  Chronic normocytic anemia Baseline hemoglobin appears to be 11 Hemoglobin dropping to 8.7 from 9.7 No sign of overt bleeding Could be dilutional Obtain iron studies  Hypertension: Blood pressure is at goal.  Hold losartan and HCTZ due to aki  T2DM: hold metformin due to AKI.  SSI.  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 Lindi Adie.  Her2 postiive, ER +, PR +.  Receiving neoadjuvant chemotherapy.  First round on 2/5.  Hyperlipidemia Continue Lipitor  Asymptomatic pyuria UA with WBC 21-50 Presented with WBC 15.5 which is trending down without antibiotics Patient states she does not have symptoms of urinary tract infection States her previous UTI presented with dysuria    DVT prophylaxis: heparin  Code Status: full  Family Communication: husband at bedside  Disposition Plan: pending improvement in renal function Consults called: none  Admission status: obs      Objective: Vitals:   10/09/18 1541 10/09/18 2200 10/10/18 0455 10/10/18 0500  BP: (!) 121/48 (!) 127/56 (!) 115/48   Pulse: 69 77 72   Resp: 16 16 16    Temp: (!) 97.5 F (36.4 C) 97.7 F (36.5 C) 98.3 F (36.8 C)   TempSrc: Oral Oral Oral   SpO2: 99% 94% 94%   Weight:    76.2 kg    Intake/Output Summary (Last 24 hours) at 10/10/2018 1309 Last data filed  at 10/09/2018 1600 Gross per 24 hour  Intake 61.76 ml  Output -  Net 61.76 ml   Filed Weights   10/10/18 0500  Weight: 76.2 kg    Exam:  . General: 73 y.o. year-old female well developed well nourished in no acute distress.  Alert and oriented x3. . Cardiovascular: Regular rate and rhythm with no rubs or gallops.  No  thyromegaly or JVD noted.   [name] Respiratory: Clear to auscultation with no wheezes or rales. Good inspiratory effort. . Abdomen: Soft nontender nondistended with normal bowel sounds x4 quadrants. . Musculoskeletal: No lower extremity edema. 2/4 pulses in all 4 extremities. . Skin: No ulcerative lesions noted or rashes, . Psychiatry: Mood is appropriate for condition and setting   Data Reviewed: CBC: Recent Labs  Lab 10/06/18 1318 10/09/18 0940 10/09/18 1518 10/10/18 0607  WBC 1.4* 15.5* 11.6* 11.0*  NEUTROABS 0.3* 11.3*  --   --   HGB 11.4* 11.3* 9.7* 8.7*  HCT 32.1* 30.6* 28.2* 25.2*  MCV 94.7 91.6 94.9 95.8  PLT 132* 175 148* 782*   Basic Metabolic Panel: Recent Labs  Lab 10/06/18 1318 10/09/18 0940 10/09/18 1518 10/10/18 0607  NA 131* 125* 129* 132*  K 3.7 3.4* 3.3* 3.3*  CL 95* 90* 99 103  CO2 26 20* 19* 20*  GLUCOSE 137* 145* 132* 116*  BUN 20 50* 50* 40*  CREATININE 1.07* 3.78* 3.00*  3.00* 1.95*  CALCIUM 9.6 9.1 7.8* 8.1*  MG  --  1.4* 2.7*  --    GFR: Estimated Creatinine Clearance: 24.5 mL/min (A) (by C-G formula based on SCr of 1.95 mg/dL (H)). Liver Function Tests: Recent Labs  Lab 10/06/18 1318 10/09/18 0940 10/10/18 0607  AST 18 18 19   ALT 29 23 21   ALKPHOS 73 88 70  BILITOT 1.1 0.5 0.5  PROT 7.0 7.3 5.4*  ALBUMIN 3.8 3.8 3.0*   No results for input(s): LIPASE, AMYLASE in the last 168 hours. No results for input(s): AMMONIA in the last 168 hours. Coagulation Profile: No results for input(s): INR, PROTIME in the last 168 hours. Cardiac Enzymes: No results for input(s): CKTOTAL, CKMB, CKMBINDEX, TROPONINI in the last 168 hours. BNP (last 3 results) No results for input(s): PROBNP in the last 8760 hours. HbA1C: No results for input(s): HGBA1C in the last 72 hours. CBG: Recent Labs  Lab 10/09/18 2141 10/10/18 0820 10/10/18 1132  GLUCAP 159* 140* 125*   Lipid Profile: No results for input(s): CHOL, HDL, LDLCALC, TRIG, CHOLHDL, LDLDIRECT  in the last 72 hours. Thyroid Function Tests: No results for input(s): TSH, T4TOTAL, FREET4, T3FREE, THYROIDAB in the last 72 hours. Anemia Panel: No results for input(s): VITAMINB12, FOLATE, FERRITIN, TIBC, IRON, RETICCTPCT in the last 72 hours. Urine analysis:    Component Value Date/Time   COLORURINE YELLOW 10/09/2018 1829   APPEARANCEUR CLEAR 10/09/2018 1829   LABSPEC 1.006 10/09/2018 1829   PHURINE 5.0 10/09/2018 1829   GLUCOSEU NEGATIVE 10/09/2018 1829   HGBUR SMALL (A) 10/09/2018 [place] NEGATIVE 10/09/2018 1829   BILIRUBINUR negative 10/11/2016 1113   KETONESUR NEGATIVE 10/09/2018 1829   PROTEINUR NEGATIVE 10/09/2018 1829   UROBILINOGEN 0.2 10/11/2016 1113   NITRITE NEGATIVE 10/09/2018 1829   LEUKOCYTESUR MODERATE (A) 10/09/2018 1829   Sepsis Labs: @LABRCNTIP (procalcitonin:4,lacticidven:4)  )No results found for this or any previous visit (from the past 240 hour(s)).    Studies: No results found.  Scheduled Meds: . atorvastatin  10 mg Oral q1800  . heparin  5,000 Units  Subcutaneous Q8H  . insulin aspart  0-5 Units Subcutaneous QHS  . insulin aspart  0-9 Units Subcutaneous TID WC  . loratadine  10 mg Oral Daily    Continuous Infusions: . lactated ringers 100 mL/hr at 10/10/18 0942     LOS: 1 day     Kayleen Memos, MD [facility] Pager [phone]  If 7PM-7AM, please contact night-coverage [email or web address] Password [facility] 10/10/2018, 1:09 PM

## 2018-10-11 ENCOUNTER — Encounter (HOSPITAL_COMMUNITY): Payer: Self-pay | Admitting: *Deleted

## 2018-10-11 DIAGNOSIS — I129 Hypertensive chronic kidney disease with stage 1 through stage 4 chronic kidney disease, or unspecified chronic kidney disease: Secondary | ICD-10-CM | POA: Diagnosis not present

## 2018-10-11 DIAGNOSIS — C50912 Malignant neoplasm of unspecified site of left female breast: Secondary | ICD-10-CM | POA: Diagnosis not present

## 2018-10-11 DIAGNOSIS — N183 Chronic kidney disease, stage 3 (moderate): Secondary | ICD-10-CM | POA: Diagnosis not present

## 2018-10-11 DIAGNOSIS — E1122 Type 2 diabetes mellitus with diabetic chronic kidney disease: Secondary | ICD-10-CM | POA: Diagnosis not present

## 2018-10-11 DIAGNOSIS — N179 Acute kidney failure, unspecified: Secondary | ICD-10-CM | POA: Diagnosis not present

## 2018-10-11 DIAGNOSIS — E860 Dehydration: Secondary | ICD-10-CM | POA: Diagnosis not present

## 2018-10-11 LAB — GASTROINTESTINAL PANEL BY PCR, STOOL (REPLACES STOOL CULTURE)

## 2018-10-11 LAB — CBC WITH DIFFERENTIAL/PLATELET
Abs Immature Granulocytes: 0.25 10*3/uL — ABNORMAL HIGH (ref 0.00–0.07)
Basophils Absolute: 0 10*3/uL (ref 0.0–0.1)
Basophils Relative: 0 %
Eosinophils Absolute: 0 10*3/uL (ref 0.0–0.5)
Eosinophils Relative: 0 %
HEMATOCRIT: 24.5 % — AB (ref 36.0–46.0)
Hemoglobin: 8.3 g/dL — ABNORMAL LOW (ref 12.0–15.0)
Immature Granulocytes: 3 %
LYMPHS ABS: 1.5 10*3/uL (ref 0.7–4.0)
LYMPHS PCT: 17 %
MCH: 33.9 pg (ref 26.0–34.0)
MCHC: 33.9 g/dL (ref 30.0–36.0)
MCV: 100 fL (ref 80.0–100.0)
MONOS PCT: 9 %
Monocytes Absolute: 0.8 10*3/uL (ref 0.1–1.0)
Neutro Abs: 6.3 10*3/uL (ref 1.7–7.7)
Neutrophils Relative %: 71 %
Platelets: 123 10*3/uL — ABNORMAL LOW (ref 150–400)
RBC: 2.45 MIL/uL — ABNORMAL LOW (ref 3.87–5.11)
RDW: 12.3 % (ref 11.5–15.5)
WBC: 8.9 10*3/uL (ref 4.0–10.5)
nRBC: 0 % (ref 0.0–0.2)

## 2018-10-11 LAB — COMPREHENSIVE METABOLIC PANEL
ALT: 20 U/L (ref 0–44)
AST: 18 U/L (ref 15–41)
Albumin: 3 g/dL — ABNORMAL LOW (ref 3.5–5.0)
Alkaline Phosphatase: 69 U/L (ref 38–126)
Anion gap: 5 (ref 5–15)
BILIRUBIN TOTAL: 0.4 mg/dL (ref 0.3–1.2)
BUN: 17 mg/dL (ref 8–23)
CO2: 22 mmol/L (ref 22–32)
Calcium: 8.1 mg/dL — ABNORMAL LOW (ref 8.9–10.3)
Chloride: 109 mmol/L (ref 98–111)
Creatinine, Ser: 1.04 mg/dL — ABNORMAL HIGH (ref 0.44–1.00)
GFR calc Af Amer: >60 mL/min (ref >60)
GFR calc non Af Amer: 53 mL/min — ABNORMAL LOW (ref >60)
Glucose, Bld: 116 mg/dL — ABNORMAL HIGH (ref 70–99)
POTASSIUM: 3.5 mmol/L (ref 3.5–5.1)
Sodium: 136 mmol/L (ref 135–145)
Total Protein: 5.5 g/dL — ABNORMAL LOW (ref 6.5–8.1)

## 2018-10-11 LAB — MAGNESIUM: Magnesium: 1.8 mg/dL (ref 1.7–2.4)

## 2018-10-11 LAB — GLUCOSE, CAPILLARY: Glucose-Capillary: 122 mg/dL — ABNORMAL HIGH (ref 70–99)

## 2018-10-11 MED ORDER — MAGNESIUM SULFATE 2 GM/50ML IV SOLN
2.0000 g | Freq: Once | INTRAVENOUS | Status: AC
  Administered 2018-10-11: 2 g via INTRAVENOUS
  Filled 2018-10-11: qty 50

## 2018-10-11 NOTE — Progress Notes (Signed)
Patient discharged to home, all discharge medications and instructions reviewed and questions answered.  Patient to be assisted to vehicle by wheelchair.  

## 2018-10-11 NOTE — Evaluation (Addendum)
Physical Therapy Evaluation Patient Details Name: Katie [name] MRN: [id number] DOB: 1946/02/16 Today's Date: 10/11/2018   History of Present Illness  admitted for  diarrhea, recent chemo. S/P TKA 8 months ago  Clinical Impression  Patient is independent. No PT needs. Independent with HEP for TKA.    Follow Up Recommendations No PT follow up    Equipment Recommendations  None recommended by PT    Recommendations for Other Services       Precautions / Restrictions Precautions Precautions: None      Mobility  Bed Mobility Overal bed mobility: Independent                Transfers Overall transfer level: Independent                  Ambulation/Gait Ambulation/Gait assistance: Independent Gait Distance (Feet): 80 Feet Assistive device: None Gait Pattern/deviations: WFL(Within Functional Limits)        Stairs            Wheelchair Mobility    Modified Rankin (Stroke Patients Only)       Balance                                             Pertinent Vitals/Pain Pain Assessment: No/denies pain    Home Living Family/patient expects to be discharged to:: Private residence Living Arrangements: Spouse/significant other                    Prior Function Level of Independence: Independent               Hand Dominance        Extremity/Trunk Assessment   Upper Extremity Assessment Upper Extremity Assessment: Overall WFL for tasks assessed    Lower Extremity Assessment Lower Extremity Assessment: Overall WFL for tasks assessed;LLE deficits/detail LLE Deficits / Details: S/P tka with 130 * knee flexion       Communication      Cognition Arousal/Alertness: Awake/alert Behavior During Therapy: WFL for tasks assessed/performed Overall Cognitive Status: Within Functional Limits for tasks assessed                                        General Comments      Exercises      Assessment/Plan    PT Assessment Patent does not need any further PT services  PT Problem List         PT Treatment Interventions      PT Goals (Current goals can be found in the Care Plan section)  Acute Rehab PT Goals Patient Stated Goal: home PT Goal Formulation: All assessment and education complete, DC therapy    Frequency     Barriers to discharge        Co-evaluation               AM-PAC PT "6 Clicks" Mobility  Outcome Measure Help needed turning from your back to your side while in a flat bed without using bedrails?: None Help needed moving from lying on your back to sitting on the side of a flat bed without using bedrails?: None Help needed moving to and from a bed to a chair (including a wheelchair)?: None Help needed standing up from a chair using your arms (  e.g., wheelchair or bedside chair)?: None Help needed to walk in hospital room?: None Help needed climbing 3-5 steps with a railing? : None 6 Click Score: 24    End of Session   Activity Tolerance: Patient tolerated treatment well Patient left: in bed Nurse Communication: Mobility status      Time: 1020-1031 PT Time Calculation (min) (ACUTE ONLY): 11 min   Charges:   PT Evaluation $PT Eval Low Complexity: [place] PT Acute Rehabilitation Services Pager [phone] Office [phone]   Claretha Cooper 10/11/2018, 10:38 AM

## 2018-10-11 NOTE — Discharge Summary (Signed)
Discharge Summary  Katie [name] TKW:[id number] DOB: 08-16-1946  PCP: [name], MD  Admit date: 10/09/2018 Discharge date: 10/11/2018  Time spent: 35 minutes  Recommendations for Outpatient Follow-up:  1. Follow-up with your oncologist Dr. Lindi Adie 2. Follow-up with your PCP within 48 hours to repeat blood pressure 3. Take your medications as prescribed 4. Avoid dehydration  Discharge Diagnoses:  Active Hospital Problems   Diagnosis Date Noted  . AKI (acute kidney injury) ([place]) 10/09/2018    Resolved Hospital Problems  No resolved problems to display.    Discharge Condition: Stable  Diet recommendation: Resume previous diet  Vitals:   10/10/18 2147 10/11/18 0517  BP: (!) 133/50 (!) 120/50  Pulse: 79 72  Resp: 16 16  Temp: 98.9 F (37.2 C) 97.9 F (36.6 C)  SpO2: 94% 94%    History of present illness:  Katie [name] a 73 y.o.femalewith medical history significant forbreast cancer, type 2 diabetes, GERD, hypertension, hyperlipidemia and other medical problems presenting with acute kidney injury in the setting of nausea, vomiting, and diarrhea after starting chemotherapy.   She notes her first round of chemo was last week. Monday and Tuesday she had bad diarrhea as well as nausea and vomiting. She saw her oncologist this Wednesday and was treated symptomatically. Unfortunately she is continued to have nausea, vomiting and diarrhea. She is also had decreased p.o. intake. She was seen in oncology clinic on the day of presentation and noted to have acute kidney injury and hyponatremia. She presented for direct admission. She denies any fevers, chills, cough, cold, chest pain, shortness of breath, dysuria. She does note some dry mouth. She denies smoking or alcohol use.  Admitted for AKI in the setting of severe dehydration.  Fluid status improved with IV fluid hydration.  At the time of this evaluation, nausea has resolved and diarrhea is  improving.  10/11/2018: Patient seen and examined at bedside.  No acute events overnight.  Renal function continues to improve and is close to her baseline.   Blood pressure is soft, will continue to hold off  HCTZ and losartan.  Recommend follow-up with her PCP within 48 hours to repeat blood pressure.  If elevated may consider prescribing amlodipine 5 mg daily.  Hospital Course:  Active Problems:   AKI (acute kidney injury) ([place])  AKI on CKD 3 Prerenal from severe dehydration Baseline creatinine appears to be 1.07 from 10/06/2018 with GFR of 51 Presented with creatinine of 3.78 Renal function continues to improve with creatinine of 1.04 Avoid dehydration Avoid nephrotoxic agents Follow-up with your PCP within 48 hours and repeat BMP  Severe dehydration,  resolved with IV fluid hydration Management as stated above  Diarrhea, improving C. difficile PCR negative GI panel pending  Hypokalemia Resolved  Hypo-volemic hyponatremia Resolved  Mild metabolic acidosis secondary to acute kidney injury Resolved  Chronic normocytic anemia Baseline hemoglobin appears to be 11 Hemoglobin dropping to 8.3 on 10/11/2018, could be dilutional on high rate IV fluid hydration No sign of overt bleeding Follow-up with oncology and repeat CBC  Hypertension: Blood pressure is at lower side of normal.    Continue to hold losartan and HCTZ.  Follow-up with PCP within 48 hours and repeat blood pressure.  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 Lindi Adie. Her2 postiive, ER +, PR +. Receiving neoadjuvant chemotherapy. First round on 09/30/18.  Hyperlipidemia Continue Lipitor  Asymptomatic pyuria UA with  WBC 21-50 Presented with WBC 15.5 which is trending down without antibiotics Patient states she does not have symptoms of urinary tract infection States her previous  UTI presented with dysuria     Code Status:full    Discharge Exam: BP (!) 120/50 (BP Location: Left Arm)   Pulse 72   Temp 97.9 F (36.6 C) (Oral)   Resp 16   Wt 75.5 kg   LMP 08/26/1992 (Approximate)   SpO2 94%   BMI 30.44 kg/m  . General: 73 y.o. year-old female well developed well nourished in no acute distress.  Alert and oriented x3. . Cardiovascular: Regular rate and rhythm with no rubs or gallops.  No thyromegaly or JVD noted.   [name] Respiratory: Clear to auscultation with no wheezes or rales. Good inspiratory effort. . Abdomen: Soft nontender nondistended with normal bowel sounds x4 quadrants. . Musculoskeletal: No lower extremity edema. 2/4 pulses in [name] 4 extremities. . Skin: No ulcerative lesions noted or rashes, . Psychiatry: Mood is appropriate for condition and setting  Discharge Instructions You were cared for by a hospitalist during your hospital stay. If you have any questions about your discharge medications or the care you received while you were in the hospital after you are discharged, you can call the unit and asked to speak with the hospitalist on call if the hospitalist that took care of you is not available. Once you are discharged, your primary care physician will handle any further medical issues. Please note that NO REFILLS for any discharge medications will be authorized once you are discharged, as it is imperative that you return to your primary care physician (or establish a relationship with a primary care physician if you do not have one) for your aftercare needs so that they can reassess your need for medications and monitor your lab values.   Allergies as of 10/11/2018      Reactions   Erythromycin Anaphylaxis   REACTION: Rash, hives   Ciprofloxacin Rash   REACTION: rash REACTION: rash   Daypro [oxaprozin]    Enalapril Maleate Nausea Only   Nsaids Nausea Only   Vioxx [rofecoxib]       Medication List    STOP taking these medications    diphenoxylate-atropine 2.5-0.025 MG tablet Commonly known as:  LOMOTIL   hydrochlorothiazide 25 MG tablet Commonly known as:  HYDRODIURIL   losartan 100 MG tablet Commonly known as:  COZAAR     TAKE these medications   acetaminophen 325 MG tablet Commonly known as:  TYLENOL Take 650 mg by mouth every 6 (six) hours as needed for mild pain or headache.   atorvastatin 10 MG tablet Commonly known as:  LIPITOR TAKE 1 TABLET(10 MG) BY MOUTH DAILY What changed:  See the new instructions.   Azelastine HCl 137 MCG/SPRAY Soln instill 1 spray into each nostril twice a day for 1 week then if needed What changed:  See the new instructions.   cholecalciferol 10 MCG (400 UNIT) Tabs tablet Commonly known as:  VITAMIN D3 Take 400 Units by mouth 2 (two) times daily.   clobetasol 0.05 % external solution Commonly known as:  TEMOVATE Apply 1 application topically 2 (two) times daily as needed (scalp). Reported on 08/16/2015   fluticasone 50 MCG/ACT nasal spray Commonly known as:  FLONASE instill 2 sprays into each nostril once daily What changed:  See the new instructions.   glucose blood test strip Commonly known as:  FREESTYLE LITE Use to check blood sugar daily.  Dx: E11.9  lidocaine-prilocaine cream Commonly known as:  EMLA Apply to affected area once What changed:    how much to take  how to take this  when to take this  reasons to take this  additional instructions   loperamide 2 MG capsule Commonly known as:  IMODIUM Take 2 mg by mouth as needed for diarrhea or loose stools.   loratadine 10 MG tablet Commonly known as:  CLARITIN Take 10 mg by mouth daily.   LORazepam 0.5 MG tablet Commonly known as:  ATIVAN Take 1 tablet (0.5 mg total) by mouth at bedtime as needed for sleep.   metFORMIN 500 MG 24 hr tablet Commonly known as:  GLUCOPHAGE-XR Take 1 tablet (500 mg total) by mouth daily with breakfast.   metroNIDAZOLE 0.75 % cream Commonly known as:   METROCREAM Apply 1 application topically 2 (two) times daily as needed (rosacea).   multivitamin with minerals Tabs tablet Take 1 tablet by mouth daily.   ondansetron 8 MG tablet Commonly known as:  ZOFRAN Take 1 tablet (8 mg total) by mouth 2 (two) times daily as needed (Nausea or vomiting). Begin 4 days after chemotherapy.   prochlorperazine 10 MG tablet Commonly known as:  COMPAZINE Take 1 tablet (10 mg total) by mouth every 6 (six) hours as needed (Nausea or vomiting).   triamcinolone cream 0.1 % Commonly known as:  KENALOG Apply 1 application topically 2 (two) times daily. What changed:    when to take this  reasons to take this   valACYclovir 500 MG tablet Commonly known as:  VALTREX Take 1 tablet (500 mg total) by mouth 2 (two) times daily. What changed:    when to take this  reasons to take this      Allergies  Allergen Reactions  . Erythromycin Anaphylaxis    REACTION: Rash, hives  . Ciprofloxacin Rash    REACTION: rash REACTION: rash  . Daypro [Oxaprozin]   . Enalapril Maleate Nausea Only  . Nsaids Nausea Only  . Vioxx [Rofecoxib]       The results of significant diagnostics from this hospitalization (including imaging, microbiology, ancillary and laboratory) are listed below for reference.    Significant Diagnostic Studies: Ct Chest W Contrast  Result Date: 09/28/2018 CLINICAL DATA:  New diagnosis of left breast cancer. Staging evaluation. EXAM: CT CHEST, ABDOMEN, AND PELVIS WITH CONTRAST TECHNIQUE: Multidetector CT imaging of the chest, abdomen and pelvis was performed following the standard protocol during bolus administration of intravenous contrast. CONTRAST:  161m OMNIPAQUE IOHEXOL 300 MG/ML  SOLN COMPARISON:  10/29/2016 right upper quadrant abdominal sonogram. 07/01/2016 chest radiograph. 08/06/2012 CT abdomen/pelvis. FINDINGS: CT CHEST FINDINGS Cardiovascular: Normal heart size. No significant pericardial effusion/thickening. Right internal  jugular Port-A-Cath terminates in the middle third of the SVC. Atherosclerotic nonaneurysmal thoracic aorta. Normal caliber pulmonary arteries. No central pulmonary emboli. Mediastinum/Nodes: No discrete thyroid nodules. Unremarkable esophagus. No pathologically enlarged axillary, mediastinal or hilar lymph nodes. Lungs/Pleura: No pneumothorax. No pleural effusion. Curvilinear opacity in the medial basilar right lower lobe (series 4/image 119) was present on 08/06/2012 CT abdomen study and is most compatible with mucoid impaction within a dilated distal bronchiole. Posterior left upper lobe 3 mm solid pulmonary nodule (series 4/image 40). No acute consolidative airspace disease, lung masses or additional significant pulmonary nodules. Musculoskeletal: No aggressive appearing focal osseous lesions. Mild thoracic spondylosis. Intact appearing bilateral breast prostheses. CT ABDOMEN PELVIS FINDINGS Hepatobiliary: Normal liver size. Hypoechoic subcapsular 1.2 cm right liver lobe lesion adjacent to the gallbladder fossa (series 2/image  67), not definitely seen on 08/06/2012 CT abdomen study. No additional liver lesions. Cholecystectomy. No biliary ductal dilatation. Pancreas: Normal, with no mass or duct dilation. Spleen: Normal size. No mass. Adrenals/Urinary Tract: Normal adrenals. Scattered subcentimeter hypodense renal cortical lesions in both kidneys are too small to characterize and require no follow-up. No hydronephrosis. Nondistended bladder is obscured by streak artifact from right hip hardware, with no gross bladder abnormality. Stomach/Bowel: Normal non-distended stomach. Normal caliber small bowel with no small bowel wall thickening. Normal appendix. Mild diffuse colonic diverticulosis, most prominent in the sigmoid colon, with no large bowel wall thickening or significant pericolonic fat stranding. Vascular/Lymphatic: Atherosclerotic nonaneurysmal abdominal aorta. Patent portal, splenic, hepatic and renal  veins. No pathologically enlarged lymph nodes in the abdomen or pelvis. Reproductive: Grossly normal uterus.  No adnexal mass. Other: No pneumoperitoneum, ascites or focal fluid collection. Musculoskeletal: No aggressive appearing focal osseous lesions. Right total hip arthroplasty. Minimal lumbar spondylosis. IMPRESSION: 1. No lymphadenopathy or other findings highly suspicious for metastatic disease in the chest, abdomen or pelvis. 2. Subcapsular small low-attenuation right liver lobe lesion adjacent to the gallbladder fossa, indeterminate. While potentially representing a benign focus of subcapsular fat, metastatic disease cannot be excluded on the basis of this scan and further characterization is recommended with MRI abdomen without and with IV contrast at this time. 3. Solitary tiny left upper lobe solid pulmonary nodule, for which follow-up chest CT is recommended in 3 months. 4. Chronic findings include: Aortic Atherosclerosis (ICD10-I70.0). Mild diffuse colonic diverticulosis. Electronically Signed   By: Ilona Sorrel M.D.   On: 09/28/2018 15:22   Nm Bone Scan Whole Body  Result Date: 09/28/2018 CLINICAL DATA:  Breast cancer, staging EXAM: NUCLEAR MEDICINE WHOLE BODY BONE SCAN TECHNIQUE: Whole body anterior and posterior images were obtained approximately 3 hours after intravenous injection of radiopharmaceutical. 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 Focal increased tracer accumulation at the proximal to mid LEFT tibial diaphysis, could be related to a long stemmed prosthesis, trauma, or metastasis; recommend radiographic correlation. Uptake at the shoulders, LEFT hip, LEFT elbow, RIGHT knee, feet, typically degenerative. Nonspecific tracer localization at the breasts bilaterally. Uptake at the lateral aspects of the cervical spine typically degenerative. Subtle increased tracer  localization at the RIGHT lateral aspect of the thoracic spine at 2 levels, could be related to degenerative disease as well. Minimal uptake adjacent to the inferior aspect of the RIGHT hip joint, potentially related to hip arthroplasty. No additional worrisome sites of osseous tracer accumulation. Expected urinary tract and soft tissue distribution of tracer. IMPRESSION: Uptake at the mid tibia, uncertain if related to a long stemmed tibial component of a knee prosthesis, trauma, or metastatic focus; recommend correlation with radiographs. The need prosthesis appears to be new since a 01/24/2018 MR. Additional scattered likely degenerative type uptake without definite additional sites of osseous metastatic disease. Electronically Signed   By: MLavonia DanaM.D.   On: 09/28/2018 17:38   Ct Abdomen Pelvis W Contrast  Result Date: 09/28/2018 CLINICAL DATA:  New diagnosis of left breast cancer. Staging evaluation. EXAM: CT CHEST, ABDOMEN, AND PELVIS WITH CONTRAST TECHNIQUE: Multidetector CT imaging of the chest, abdomen and pelvis was performed following the standard protocol during bolus administration of intravenous contrast. CONTRAST:  1021mOMNIPAQUE IOHEXOL 300 MG/ML  SOLN COMPARISON:  10/29/2016 right upper quadrant abdominal sonogram. 07/01/2016 chest radiograph. 08/06/2012 CT abdomen/pelvis. FINDINGS: CT CHEST FINDINGS Cardiovascular:  Normal heart size. No significant pericardial effusion/thickening. Right internal jugular Port-A-Cath terminates in the middle third of the SVC. Atherosclerotic nonaneurysmal thoracic aorta. Normal caliber pulmonary arteries. No central pulmonary emboli. Mediastinum/Nodes: No discrete thyroid nodules. Unremarkable esophagus. No pathologically enlarged axillary, mediastinal or hilar lymph nodes. Lungs/Pleura: No pneumothorax. No pleural effusion. Curvilinear opacity in the medial basilar right lower lobe (series 4/image 119) was present on 08/06/2012 CT abdomen study and is most  compatible with mucoid impaction within a dilated distal bronchiole. Posterior left upper lobe 3 mm solid pulmonary nodule (series 4/image 40). No acute consolidative airspace disease, lung masses or additional significant pulmonary nodules. Musculoskeletal: No aggressive appearing focal osseous lesions. Mild thoracic spondylosis. Intact appearing bilateral breast prostheses. CT ABDOMEN PELVIS FINDINGS Hepatobiliary: Normal liver size. Hypoechoic subcapsular 1.2 cm right liver lobe lesion adjacent to the gallbladder fossa (series 2/image 67), not definitely seen on 08/06/2012 CT abdomen study. No additional liver lesions. Cholecystectomy. No biliary ductal dilatation. Pancreas: Normal, with no mass or duct dilation. Spleen: Normal size. No mass. Adrenals/Urinary Tract: Normal adrenals. Scattered subcentimeter hypodense renal cortical lesions in both kidneys are too small to characterize and require no follow-up. No hydronephrosis. Nondistended bladder is obscured by streak artifact from right hip hardware, with no gross bladder abnormality. Stomach/Bowel: Normal non-distended stomach. Normal caliber small bowel with no small bowel wall thickening. Normal appendix. Mild diffuse colonic diverticulosis, most prominent in the sigmoid colon, with no large bowel wall thickening or significant pericolonic fat stranding. Vascular/Lymphatic: Atherosclerotic nonaneurysmal abdominal aorta. Patent portal, splenic, hepatic and renal veins. No pathologically enlarged lymph nodes in the abdomen or pelvis. Reproductive: Grossly normal uterus.  No adnexal mass. Other: No pneumoperitoneum, ascites or focal fluid collection. Musculoskeletal: No aggressive appearing focal osseous lesions. Right total hip arthroplasty. Minimal lumbar spondylosis. IMPRESSION: 1. No lymphadenopathy or other findings highly suspicious for metastatic disease in the chest, abdomen or pelvis. 2. Subcapsular small low-attenuation right liver lobe lesion  adjacent to the gallbladder fossa, indeterminate. While potentially representing a benign focus of subcapsular fat, metastatic disease cannot be excluded on the basis of this scan and further characterization is recommended with MRI abdomen without and with IV contrast at this time. 3. Solitary tiny left upper lobe solid pulmonary nodule, for which follow-up chest CT is recommended in 3 months. 4. Chronic findings include: Aortic Atherosclerosis (ICD10-I70.0). Mild diffuse colonic diverticulosis. Electronically Signed   By: Ilona Sorrel M.D.   On: 09/28/2018 15:22    Microbiology: Recent Results (from the past 240 hour(s))  C difficile quick scan w PCR reflex     Status: None   Collection Time: 10/10/18  1:22 PM  Result Value Ref Range Status   C Diff antigen NEGATIVE NEGATIVE Final   C Diff toxin NEGATIVE NEGATIVE Final   C Diff interpretation No C. difficile detected.  Final    Comment: Performed at [facility], [place] [address] [place], [place] [address]     Labs: Basic Metabolic Panel: Recent Labs  Lab 10/06/18 1318 10/09/18 0940 10/09/18 1518 10/10/18 0607 10/11/18 0700  NA 131* 125* 129* 132* 136  K 3.7 3.4* 3.3* 3.3* 3.5  CL 95* 90* 99 103 109  CO2 26 20* 19* 20* 22  GLUCOSE 137* 145* 132* 116* 116*  BUN 20 50* 50* 40* 17  CREATININE 1.07* 3.78* 3.00*  3.00* 1.95* 1.04*  CALCIUM 9.6 9.1 7.8* 8.1* 8.1*  MG  --  1.4* 2.7*  --  1.8   Liver Function Tests: Recent  Labs  Lab 10/06/18 1318 10/09/18 0940 10/10/18 0607 10/11/18 0700  AST 18 18 19 18   ALT 29 23 21 20   ALKPHOS 73 88 70 69  BILITOT 1.1 0.5 0.5 0.4  PROT 7.0 7.3 5.4* 5.5*  ALBUMIN 3.8 3.8 3.0* 3.0*   No results for input(s): LIPASE, AMYLASE in the last 168 hours. No results for input(s): AMMONIA in the last 168 hours. CBC: Recent Labs  Lab 10/06/18 1318 10/09/18 0940 10/09/18 1518 10/10/18 0607 10/11/18 0700  WBC 1.4* 15.5* 11.6* 11.0* 8.9  NEUTROABS 0.3* 11.3*  --   --  PENDING   HGB 11.4* 11.3* 9.7* 8.7* 8.3*  HCT 32.1* 30.6* 28.2* 25.2* 24.5*  MCV 94.7 91.6 94.9 95.8 100.0  PLT 132* 175 148* 142* 123*   Cardiac Enzymes: No results for input(s): CKTOTAL, CKMB, CKMBINDEX, TROPONINI in the last 168 hours. BNP: BNP (last 3 results) No results for input(s): BNP in the last 8760 hours.  ProBNP (last 3 results) No results for input(s): PROBNP in the last 8760 hours.  CBG: Recent Labs  Lab 10/10/18 0820 10/10/18 1132 10/10/18 1659 10/10/18 2155 10/11/18 0745  GLUCAP 140* 125* 126* 133* 122*       Signed:  Kayleen Memos, MD [facility] 10/11/2018, 9:01 AM

## 2018-10-12 ENCOUNTER — Telehealth: Payer: Self-pay

## 2018-10-12 ENCOUNTER — Other Ambulatory Visit: Payer: Medicare Other

## 2018-10-12 ENCOUNTER — Ambulatory Visit: Payer: Medicare Other

## 2018-10-12 NOTE — Telephone Encounter (Signed)
Spoke with patient to follow up from recent hospitalization.  Pt with concerns and questions regarding discontinuation of Losartan, Hydrochlorothiazide, and Lomotil.     Pt reports "semi formed" stool this AM.  Pt concerned with severe diarrhea reoccurring after next infusion.  Pt tolerating fluids well.  Feeling much improved since hospital discharge on 10/11/2018.    Nurse will review MD for further recommendations.  Pt voiced understanding.

## 2018-10-12 NOTE — Telephone Encounter (Signed)
Transition Care Management Follow-up Telephone Call  Admit date: 10/09/2018 Discharge date: 10/11/2018 Dx: AKI   How have you been since you were released from the hospital? "I'm okay"   Do you understand why you were in the hospital? yes, "I was dehydrated"   Do you understand the discharge instructions? yes   Where were you discharged to? Home. Resides with husband.    Items Reviewed:  Medications reviewed: yes  Allergies reviewed: yes  Dietary changes reviewed: yes  Referrals reviewed: yes   Functional Questionnaire:   Activities of Daily Living (ADLs):   She states they are independent in the following: ambulation, bathing and hygiene, feeding, continence, grooming, toileting and dressing States they require assistance with the following: None.    Any transportation issues/concerns?: no   Any patient concerns? no   Confirmed importance and date/time of follow-up visits scheduled yes  Provider Appointment booked with PCP 10/15/2018.  Confirmed with patient if condition begins to worsen call PCP or go to the ER.  Patient was given the office number and encouraged to call back with question or concerns.  : yes

## 2018-10-12 NOTE — Telephone Encounter (Signed)
Per MD recommendations patient scheduled for follow up this week.  Pt aware and has no further needs at this time.

## 2018-10-13 ENCOUNTER — Ambulatory Visit
Admission: RE | Admit: 2018-10-13 | Discharge: 2018-10-13 | Disposition: A | Discharge status: Home / Self Care | Payer: Medicare Other | Source: Ambulatory Visit | Attending: Hematology and Oncology | Admitting: Hematology and Oncology

## 2018-10-13 ENCOUNTER — Inpatient Hospital Stay (HOSPITAL_BASED_OUTPATIENT_CLINIC_OR_DEPARTMENT_OTHER): Payer: Medicare Other | Admitting: Hematology and Oncology

## 2018-10-13 ENCOUNTER — Inpatient Hospital Stay: Payer: Medicare Other

## 2018-10-13 DIAGNOSIS — E871 Hypo-osmolality and hyponatremia: Secondary | ICD-10-CM

## 2018-10-13 DIAGNOSIS — E860 Dehydration: Secondary | ICD-10-CM

## 2018-10-13 DIAGNOSIS — Z170 Estrogen receptor positive status [ER+]: Secondary | ICD-10-CM

## 2018-10-13 DIAGNOSIS — N179 Acute kidney failure, unspecified: Secondary | ICD-10-CM

## 2018-10-13 DIAGNOSIS — Z5112 Encounter for antineoplastic immunotherapy: Secondary | ICD-10-CM | POA: Diagnosis not present

## 2018-10-13 DIAGNOSIS — E119 Type 2 diabetes mellitus without complications: Secondary | ICD-10-CM | POA: Diagnosis not present

## 2018-10-13 DIAGNOSIS — D649 Anemia, unspecified: Secondary | ICD-10-CM | POA: Diagnosis not present

## 2018-10-13 DIAGNOSIS — E876 Hypokalemia: Secondary | ICD-10-CM

## 2018-10-13 DIAGNOSIS — K769 Liver disease, unspecified: Secondary | ICD-10-CM | POA: Diagnosis not present

## 2018-10-13 DIAGNOSIS — D72819 Decreased white blood cell count, unspecified: Secondary | ICD-10-CM | POA: Diagnosis not present

## 2018-10-13 DIAGNOSIS — Z5111 Encounter for antineoplastic chemotherapy: Secondary | ICD-10-CM | POA: Diagnosis not present

## 2018-10-13 DIAGNOSIS — Z79899 Other long term (current) drug therapy: Secondary | ICD-10-CM

## 2018-10-13 DIAGNOSIS — Z95828 Presence of other vascular implants and grafts: Secondary | ICD-10-CM

## 2018-10-13 DIAGNOSIS — Z7984 Long term (current) use of oral hypoglycemic drugs: Secondary | ICD-10-CM

## 2018-10-13 DIAGNOSIS — C50412 Malignant neoplasm of upper-outer quadrant of left female breast: Secondary | ICD-10-CM

## 2018-10-13 LAB — NOROVIRUS GROUP 1 & 2 BY PCR, STOOL
Norovirus 1 by PCR: NEGATIVE
Norovirus 2  by PCR: NEGATIVE

## 2018-10-13 MED ORDER — HEPARIN SOD (PORK) LOCK FLUSH 100 UNIT/ML IV SOLN
500.0000 [IU] | Freq: Once | INTRAVENOUS | Status: AC
  Administered 2018-10-13: 500 [IU]
  Filled 2018-10-13: qty 5

## 2018-10-13 MED ORDER — GADOBENATE DIMEGLUMINE 529 MG/ML IV SOLN
15.0000 mL | Freq: Once | INTRAVENOUS | Status: AC | PRN
  Administered 2018-10-13: 15 mL via INTRAVENOUS

## 2018-10-13 MED ORDER — SODIUM CHLORIDE 0.9% FLUSH
10.0000 mL | Freq: Once | INTRAVENOUS | Status: DC
  Filled 2018-10-13: qty 10

## 2018-10-13 MED ORDER — SODIUM CHLORIDE 0.9 % IV SOLN
INTRAVENOUS | Status: DC
  Administered 2018-10-13: 14:00:00 via INTRAVENOUS
  Filled 2018-10-13 (×2): qty 250

## 2018-10-13 NOTE — Assessment & Plan Note (Addendum)
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 Neoadjuvant chemotherapy with TCH Perjeta 6 cycles followed by Herceptin maintenance for 1 year 2. Followed by mastectomy with sentinel lymph node study 3. Followed by adjuvant radiation therapy ---------------------------------------------------------------  CT chest abdomen pelvis and bone scan: 09/28/2018: 3 mm lung nodule and 1 cm liver subcapsular lesion We will get an MRI of the liver. The lung nodule will need a CT in 3 months. She had a bone lesion on the bone scan which is related to her knee replacement surgery and does not need any additional work-up. ------------------------------------------------------------------- Current treatment: Cycle 1 day 14 neoadjuvant TCH Perjeta Chemo toxicities: 1.  Severe diarrhea in spite of Imodium and Lomotil required hospitalization.  Based upon these findings we will discontinue Perjeta: IV fluids will be given today. 2.  Severe fatigue 3.  Leukopenia: We reduced the dosage of cycle 2 of chemo  Labs have been reviewed Return to clinic for cycle 2 as scheduled

## 2018-10-13 NOTE — Patient Instructions (Signed)

## 2018-10-13 NOTE — Progress Notes (Signed)
 Patient Care Team: Bedsole, Amy E, MD as PCP - General (Family Medicine) Porfilio, William, MD as Referring Physician (Ophthalmology) Isenstein, Arin L, MD as Consulting Physician (Dermatology) Kernodle, Harold, MD as Consulting Physician (Orthopedic Surgery) Woodham, Charles, MD as Consulting Physician (General Surgery) Makhlouf, Mary, MD as Consulting Physician (Dentistry) Hoxworth, Benjamin, MD as Consulting Physician (General Surgery) Gudena, Vinay, MD as Consulting Physician (Hematology and Oncology) Squire, Sarah, MD as Attending Physician (Radiation Oncology)  DIAGNOSIS:    ICD-10-CM   1. Malignant neoplasm of upper-outer quadrant of left breast in female, estrogen receptor positive (HCC) C50.412    Z17.0     SUMMARY OF ONCOLOGIC HISTORY:   Malignant neoplasm of upper-outer quadrant of left breast in female, estrogen receptor positive (HCC)   09/14/2018 Initial Diagnosis    With prior history of breast implants that were ruptured/replaced, patient had cellulitis LIQ left breast for 1 week, and UOQ asymmetry 1.5 cm, MRI right breast biopsy benign, left breast numerous masses 7 cm skin thickening: 2 biopsies from left breast: Grade 2-3 IDC ER PR positive, HER-2 positive, Ki-67 50%    09/16/2018 Cancer Staging    Staging form: Breast, AJCC 8th Edition - Clinical stage from 09/16/2018: Stage IIIB (cT4, cN0, cM0, G3, ER+, PR+, HER2+) - Signed by Gudena, Vinay, MD on 09/16/2018    09/29/2018 -  Neo-Adjuvant Chemotherapy    TCHP q 3 weeks     CHIEF COMPLIANT: Follow-up after recent hospitalization  INTERVAL HISTORY: Katie [name] is a 73 y.o. with above-mentioned history of left breast cancer currently receiving neoadjuvant chemotherapy with TCHP. On 10/09/18 she presented to the ED for dehydration after experiencing consistent diarrhea, nausea, and vomiting that continued 8-9 days following treatment. She could not eat or drink, as she would vomit shortly after. She was discharged  on 10/11/18 and presents to the clinic today with her husband. She notes she is feeling much better and has had normal stools yesterday and today. She denies any dizziness, lightheadedness, or bleeding. She notes irritation from adhesive tape on her right arm and port site. She was on cozaar and hydrochlorothiazide for high bp, but both were taken away at the ED in response to poor kidney function. Her bp at the clinic today was 161/58 and she will follow-up with her PCP tomorrow. Her labs from 10/11/18 show: Hg 8.3, WBC 8.9, platelets 123, ANC 6.3.  REVIEW OF SYSTEMS:   Constitutional: Denies fevers, chills or abnormal weight loss Eyes: Denies blurriness of vision Ears, nose, mouth, throat, and face: Denies mucositis or sore throat Respiratory: Denies cough, dyspnea or wheezes Cardiovascular: Denies palpitation, chest discomfort Gastrointestinal: Denies heartburn (+) diarrhea (+) nausea (+) vomiting Skin: Denies abnormal skin rashes (+) redness and irritation, right elbow and port site Lymphatics: Denies new lymphadenopathy or easy bruising Neurological: Denies numbness, tingling or new weaknesses Behavioral/Psych: Mood is stable, no new changes  Extremities: No lower extremity edema Breast: denies any pain or lumps or nodules in either breasts All other systems were reviewed with the patient and are negative.  I have reviewed the past medical history, past surgical history, social history and family history with the patient and they are unchanged from previous note.  ALLERGIES:  is allergic to erythromycin; ciprofloxacin; daypro [oxaprozin]; enalapril maleate; nsaids; and vioxx [rofecoxib].  MEDICATIONS:  Current Outpatient Medications  Medication Sig Dispense Refill  . acetaminophen (TYLENOL) 325 MG tablet Take 650 mg by mouth every 6 (six) hours as needed for mild pain or headache.    .   atorvastatin (LIPITOR) 10 MG tablet TAKE 1 TABLET(10 MG) BY MOUTH DAILY (Patient taking differently:  Take 10 mg by mouth daily at 6 PM. ) 90 tablet 0  . Azelastine HCl 137 MCG/SPRAY SOLN instill 1 spray into each nostril twice a day for 1 week then if needed (Patient not taking: No sig reported) 30 mL 5  . cholecalciferol (VITAMIN D) 400 units TABS tablet Take 400 Units by mouth 2 (two) times daily.    . clobetasol (TEMOVATE) 0.05 % external solution Apply 1 application topically 2 (two) times daily as needed (scalp). Reported on 08/16/2015    . diphenoxylate-atropine (LOMOTIL) 2.5-0.025 MG tablet     . fluticasone (FLONASE) 50 MCG/ACT nasal spray instill 2 sprays into each nostril once daily (Patient taking differently: Place 2 sprays into both nostrils daily as needed for allergies. ) 16 g 5  . glucose blood (FREESTYLE LITE) test strip Use to check blood sugar daily.  Dx: E11.9 50 each 12  . lidocaine-prilocaine (EMLA) cream Apply to affected area once (Patient taking differently: Apply 1 application topically as needed (port access). ) 30 g 3  . loperamide (IMODIUM) 2 MG capsule Take 2 mg by mouth as needed for diarrhea or loose stools.    [name] loratadine (CLARITIN) 10 MG tablet Take 10 mg by mouth daily.    [name] LORazepam (ATIVAN) 0.5 MG tablet Take 1 tablet (0.5 mg total) by mouth at bedtime as needed for sleep. 30 tablet 0  . metFORMIN (GLUCOPHAGE-XR) 500 MG 24 hr tablet Take 1 tablet (500 mg total) by mouth daily with breakfast. 90 tablet 3  . metroNIDAZOLE (METROCREAM) 0.75 % cream Apply 1 application topically 2 (two) times daily as needed (rosacea).     . Multiple Vitamin (MULTIVITAMIN WITH MINERALS) TABS tablet Take 1 tablet by mouth daily.    . ondansetron (ZOFRAN) 8 MG tablet Take 1 tablet (8 mg total) by mouth 2 (two) times daily as needed (Nausea or vomiting). Begin 4 days after chemotherapy. 30 tablet 1  . prochlorperazine (COMPAZINE) 10 MG tablet Take 1 tablet (10 mg total) by mouth every 6 (six) hours as needed (Nausea or vomiting). 30 tablet 1  . triamcinolone cream (KENALOG) 0.1 %  Apply 1 application topically 2 (two) times daily. (Patient taking differently: Apply 1 application topically 2 (two) times daily as needed (skin rashes). ) 15 g 0  . valACYclovir (VALTREX) 500 MG tablet Take 1 tablet (500 mg total) by mouth 2 (two) times daily. (Patient taking differently: Take 500 mg by mouth as needed (flare up). ) 6 tablet 2   No current facility-administered medications for this visit.     PHYSICAL EXAMINATION: ECOG PERFORMANCE STATUS: 1 - Symptomatic but completely ambulatory  Vitals:   10/13/18 1314  BP: (!) 161/58  Pulse: 78  Resp: 18  Temp: 98.3 F (36.8 C)  SpO2: 97%   Filed Weights   10/13/18 1314  Weight: 167 lb 14.4 oz (76.2 kg)    GENERAL: alert, no distress and comfortable SKIN: skin color, texture, turgor are normal, no rashes or significant lesions EYES: normal, Conjunctiva are pink and non-injected, sclera clear OROPHARYNX: no exudate, no erythema and lips, buccal mucosa, and tongue normal  NECK: supple, thyroid normal size, non-tender, without nodularity LYMPH: no palpable lymphadenopathy in the cervical, axillary or inguinal LUNGS: clear to auscultation and percussion with normal breathing effort HEART: regular rate & rhythm and no murmurs and no lower extremity edema ABDOMEN: abdomen soft, non-tender and normal bowel sounds  MUSCULOSKELETAL: no cyanosis of digits and no clubbing  NEURO: alert & oriented x 3 with fluent speech, no focal motor/sensory deficits EXTREMITIES: No lower extremity edema  LABORATORY DATA:  I have reviewed the data as listed CMP Latest Ref Rng & Units 10/11/2018 10/10/2018 10/09/2018  Glucose 70 - 99 mg/dL 116(H) 116(H) 132(H)  BUN 8 - 23 mg/dL 17 40(H) 50(H)  Creatinine 0.44 - 1.00 mg/dL 1.04(H) 1.95(H) 3.00(H)  Sodium 135 - 145 mmol/L 136 132(L) 129(L)  Potassium 3.5 - 5.1 mmol/L 3.5 3.3(L) 3.3(L)  Chloride 98 - 111 mmol/L 109 103 99  CO2 22 - 32 mmol/L 22 20(L) 19(L)  Calcium 8.9 - 10.3 mg/dL 8.1(L) 8.1(L)  7.8(L)  Total Protein 6.5 - 8.1 g/dL 5.5(L) 5.4(L) -  Total Bilirubin 0.3 - 1.2 mg/dL 0.4 0.5 -  Alkaline Phos 38 - 126 U/L 69 70 -  AST 15 - 41 U/L 18 19 -  ALT 0 - 44 U/L 20 21 -    Lab Results  Component Value Date   WBC 8.9 10/11/2018   HGB 8.3 (L) 10/11/2018   HCT 24.5 (L) 10/11/2018   MCV 100.0 10/11/2018   PLT 123 (L) 10/11/2018   NEUTROABS 6.3 10/11/2018    ASSESSMENT & PLAN:  Malignant neoplasm of upper-outer quadrant of left breast in female, estrogen receptor positive ([facility]) With prior history of breast implants that were ruptured/replaced, patient had cellulitis LIQ left breast for 1 week, and UOQ asymmetry 1.5 cm, MRI right breast biopsy benign, left breast numerous masses 7 cm skin thickening: 2 biopsies from left breast: Grade 2-3 IDC ER PR positive, HER-2 positive, Ki-67 50% Clinical stage: T4 N0 M0 stage IIIb  Treatment plan 1. Neoadjuvant chemotherapy with TCH Perjeta 6 cycles followed by Herceptin maintenance for 1 year 2. Followed by mastectomy with sentinel lymph node study 3. Followed by adjuvant radiation therapy ---------------------------------------------------------------  CT chest abdomen pelvis and bone scan: 09/28/2018: 3 mm lung nodule and 1 cm liver subcapsular lesion We will get an MRI of the liver. The lung nodule will need a CT in 3 months. She had a bone lesion on the bone scan which is related to her knee replacement surgery and does not need any additional work-up. ------------------------------------------------------------------- Current treatment: Cycle 1 day 14 neoadjuvant TCH Perjeta Chemo toxicities: 1.  Severe diarrhea in spite of Imodium and Lomotil required hospitalization.  Based upon these findings we will discontinue Perjeta: IV fluids will be given today.  Diarrhea led to renal failure which required hospitalization and continuous IV fluids. 2.  Severe fatigue 3.  Leukopenia:  We reduced the dosage of cycle 2 of chemo 4.   Nausea and vomiting: Hopefully with cycle 2 the symptoms will be lower because of reduced dosage.  Labs have been reviewed Return to clinic for cycle 2 as scheduled    No orders of the defined types were placed in this encounter.  The patient has a good understanding of the overall plan. she agrees with it. she will call with any problems that may develop before the next visit here.  [name], MD 10/13/2018  Julious Oka Dorshimer am acting as scribe for Dr. [name].  I have reviewed the above documentation for accuracy and completeness, and I agree with the above.

## 2018-10-14 ENCOUNTER — Other Ambulatory Visit: Payer: Self-pay

## 2018-10-14 ENCOUNTER — Telehealth: Payer: Self-pay | Admitting: Hematology and Oncology

## 2018-10-14 ENCOUNTER — Telehealth: Payer: Self-pay | Admitting: *Deleted

## 2018-10-14 MED ORDER — CHOLESTYRAMINE 4 G PO PACK: 4.0000 g | PACK | Freq: Three times a day (TID) | ORAL | 12 refills | Status: DC

## 2018-10-14 NOTE — Progress Notes (Signed)
Pt calling in distress d/t severe frequency and amount of watery diarrhea since last night. Pt had IVF yesterday, after seeing Dr.Gudena, but started to have increased diarrhea since 5pm yesterday. She had been taking lomotil and imodium alternatively. Discussed with Dr.Gudena, and obtained order for questran to take today. Sent to pharmacy per MD request. Pt notified and instructed to take with food and to start 1 packet and increase up to TID if diarrhea is still present. Pt also instructed to take 2 lomotil tablets up to 4x daily. Max of 20mg . Per day.   Suggested that pt do aggressive hydration today between water and electrolytes. Told pt to do a bland diet and eliminate heavy solid foods until diarrhea resolve to prevent further abdominal discomfort. Pt to call back tomorrow and report on how she is doing with diarrhea and if it has reduced overnight. Per Dr.Gudena, pt may need sandostatin if diarrhea unresolved by tomorrow. Pt notified and is aware.

## 2018-10-14 NOTE — Telephone Encounter (Signed)
No los °

## 2018-10-14 NOTE — Telephone Encounter (Signed)
"  Katie [name]'s spouse [phone]).  Having a problem here.  Need to talk with triage nurse or preferably Dr. Geralyn Flash office as soon as possible."  Returned call ended by spouse.  No further information.  "We have Dr. Geralyn Flash nurse on  the line now."

## 2018-10-15 ENCOUNTER — Ambulatory Visit: Payer: Medicare Other

## 2018-10-15 ENCOUNTER — Encounter: Payer: Self-pay | Admitting: Family Medicine

## 2018-10-15 ENCOUNTER — Ambulatory Visit (INDEPENDENT_AMBULATORY_CARE_PROVIDER_SITE_OTHER): Payer: Medicare Other | Admitting: Family Medicine

## 2018-10-15 ENCOUNTER — Telehealth: Payer: Self-pay

## 2018-10-15 VITALS — BP 146/66 | HR 88 | Temp 97.8°F | Ht 62.0 in | Wt 168.5 lb

## 2018-10-15 DIAGNOSIS — N179 Acute kidney failure, unspecified: Secondary | ICD-10-CM | POA: Diagnosis not present

## 2018-10-15 DIAGNOSIS — J302 Other seasonal allergic rhinitis: Secondary | ICD-10-CM

## 2018-10-15 DIAGNOSIS — T451X5A Adverse effect of antineoplastic and immunosuppressive drugs, initial encounter: Secondary | ICD-10-CM | POA: Diagnosis not present

## 2018-10-15 DIAGNOSIS — D6481 Anemia due to antineoplastic chemotherapy: Secondary | ICD-10-CM | POA: Diagnosis not present

## 2018-10-15 DIAGNOSIS — E860 Dehydration: Secondary | ICD-10-CM | POA: Insufficient documentation

## 2018-10-15 DIAGNOSIS — Z8639 Personal history of other endocrine, nutritional and metabolic disease: Secondary | ICD-10-CM

## 2018-10-15 DIAGNOSIS — I10 Essential (primary) hypertension: Secondary | ICD-10-CM | POA: Diagnosis not present

## 2018-10-15 DIAGNOSIS — D649 Anemia, unspecified: Secondary | ICD-10-CM | POA: Insufficient documentation

## 2018-10-15 DIAGNOSIS — R197 Diarrhea, unspecified: Secondary | ICD-10-CM | POA: Diagnosis not present

## 2018-10-15 MED ORDER — AMLODIPINE BESYLATE 5 MG PO TABS: 5.0000 mg | ORAL_TABLET | Freq: Every day | ORAL | 11 refills | Status: DC

## 2018-10-15 MED ORDER — OLOPATADINE HCL 0.2 % OP SOLN: OPHTHALMIC | 0 refills | Status: DC

## 2018-10-15 NOTE — Telephone Encounter (Signed)
Pt called to report that she is doing better with taking questran for diarrhea. Pt took it 2x yesterday and once this morning. Pt did report that she had a large watery diarrhea afterwards, but nothing else since. Told pt to take and increase questran up to 3x a day per Dr.Gudena recommendation, and to maintain good hydration. Pt verbalized understanding and will call with any further concerns.

## 2018-10-15 NOTE — Assessment & Plan Note (Signed)
Followed by ONC closely. May need trasfusion.

## 2018-10-15 NOTE — Assessment & Plan Note (Signed)
Resolved with IVF 

## 2018-10-15 NOTE — Patient Instructions (Addendum)
Stay off other blood pressure medications.  Start amlodipine 5 mg daily. Follow Blood pressure at home and call if > 140/90.  Stop claritin, try Allegra or Xyzal.  Start topical drops for allergies in eye.  If not improving or shortness of breath.. call oiur office or Dr. Lindi Adie.

## 2018-10-15 NOTE — Assessment & Plan Note (Signed)
Given possibility of dehydration again with further chemo ( hopefully less likely on lower dosage) will hold off on restarting diuretic and ACEI. Instead will start amlodipine low dose.   Once chemo/breast cancer treatment completed will consider restart of ACEI as this is best BP med in diabetic.

## 2018-10-15 NOTE — Assessment & Plan Note (Addendum)
Due to dehydration in setting of chemo/HCTZ/ACEI.

## 2018-10-15 NOTE — Progress Notes (Signed)
Subjective:    Patient ID: Katie [name], female    DOB: Aug 28, 1945, 73 y.o.   MRN: [id number]  HPI  73 year old female with recently diagnosed breast cancer now being treated with chemo therapy on 10/06/18  Admitted on  2/14 to 2/16 with severe diarrhea, dehydration with resulting AKI. Negative infectious disease GI panel. She was seen in oncology clinic on the day of presentation and noted to have acute kidney injury and hyponatremia.   AKI improved with IV hydration.  Given dehydration blood pressure was on low side... HCTZ and losartan were held.   Followed up with Dr. Lindi Adie on 2/18  Stools and eating back to baseline.  Next treatment with be lowered dosage so hopefully less SE. Also not on perjenta.  CBC and BMET followed by oncology.  May need transfusion with treatment.  She reports today continued elevated BPs off medication. She has not nausea, but still both formed and watery.   She has noted bilateral burning eye, itchy and watery no vision change. Also runny nose, sneeze and post nasal drip, red eyes.  She is on Claritin and  flonase  Social History /Family History/Past Medical History reviewed in detail and updated in EMR if needed. Blood pressure (!) 146/66, pulse 88, temperature 97.8 F (36.6 C), temperature source Oral, height 5\' 2"  (1.575 m), weight 168 lb 8 oz (76.4 kg), last menstrual period 08/26/1992, SpO2 95 %.  Review of Systems  Constitutional: Negative for fatigue and fever.  HENT: Positive for postnasal drip and rhinorrhea. Negative for congestion.   Eyes: Positive for discharge, redness and itching. Negative for pain.  Respiratory: Negative for cough and shortness of breath.   Cardiovascular: Negative for chest pain, palpitations and leg swelling.  Gastrointestinal: Negative for abdominal pain.  Genitourinary: Negative for dysuria and vaginal bleeding.  Musculoskeletal: Negative for back pain.  Neurological: Negative for syncope, light-headedness  and headaches.  Psychiatric/Behavioral: Negative for dysphoric mood.       Objective:   Physical Exam Constitutional:      General: She is not in acute distress.    Appearance: Normal appearance. She is well-developed. She is not ill-appearing or toxic-appearing.  HENT:     Head: Normocephalic.     Right Ear: Hearing, tympanic membrane, ear canal and external ear normal. Tympanic membrane is not erythematous, retracted or bulging.     Left Ear: Hearing, tympanic membrane, ear canal and external ear normal. Tympanic membrane is not erythematous, retracted or bulging.     Nose: Rhinorrhea present. No mucosal edema.     Right Turbinates: Not enlarged or pale.     Left Turbinates: Not enlarged or pale.     Right Sinus: No maxillary sinus tenderness or frontal sinus tenderness.     Left Sinus: No maxillary sinus tenderness or frontal sinus tenderness.     Mouth/Throat:     Mouth: Mucous membranes are moist.     Pharynx: Uvula midline. No oropharyngeal exudate.  Eyes:     General: Lids are normal. Lids are everted, no foreign bodies appreciated.     Conjunctiva/sclera:     Right eye: Right conjunctiva is injected. No exudate.    Left eye: Left conjunctiva is injected. No exudate.    Pupils: Pupils are equal, round, and reactive to light.     Comments:  Bilateral eyes clear watering  Neck:     Musculoskeletal: Normal range of motion and neck supple.     Thyroid: No thyroid mass or  thyromegaly.     Vascular: No carotid bruit.     Trachea: Trachea normal.  Cardiovascular:     Rate and Rhythm: Normal rate and regular rhythm.     Pulses: Normal pulses.     Heart sounds: Normal heart sounds, S1 normal and S2 normal. No murmur. No friction rub. No gallop.   Pulmonary:     Effort: Pulmonary effort is normal. No tachypnea or respiratory distress.     Breath sounds: Normal breath sounds. No decreased breath sounds, wheezing, rhonchi or rales.  Abdominal:     General: Bowel sounds are  normal.     Palpations: Abdomen is soft.     Tenderness: There is no abdominal tenderness.  Skin:    General: Skin is warm and dry.     Findings: No rash.  Neurological:     Mental Status: She is alert.  Psychiatric:        Mood and Affect: Mood is not anxious or depressed.        Speech: Speech normal.        Behavior: Behavior normal. Behavior is cooperative.        Thought Content: Thought content normal.        Judgment: Judgment normal.           Assessment & Plan:

## 2018-10-15 NOTE — Assessment & Plan Note (Signed)
Most likely cause of eye itching and burning... change antihistamine and stay allergy eyre drops. May also be early URI, but lung exam clear on exam today.

## 2018-10-15 NOTE — Assessment & Plan Note (Signed)
Neg infectious panel.  Still intermittent symptoms. Pt using immodium.

## 2018-10-19 ENCOUNTER — Telehealth: Payer: Self-pay

## 2018-10-19 NOTE — Telephone Encounter (Signed)
Nurse placed call to follow up with patient regarding diarrhea.  Pt reports decrease in amount of loose stools over the weekend.  Pt reports 2 formed stools and 2 loose stools over weekend, however no loose stools today.   Pt reports redness to eye.  Patient denies any fever, no discolored discharge only "watery eyes." No vision changes.  Nurse encourage use of cold compress to eyes,  And monitor for fever.  Pt voiced understanding, acknowledge she will call if any changes prior to upcoming appointment.

## 2018-10-20 NOTE — Progress Notes (Signed)
Patient Care Team: Katie Sanders, MD as PCP - General (Family Medicine) Birder [name], MD as Referring Physician (Ophthalmology) [name], MD as Consulting Physician (Dermatology) [name], MD as Consulting Physician (Orthopedic Surgery) [name], MD as Consulting Physician (General Surgery) [name], MD as Consulting Physician (Dentistry) [name], MD as Consulting Physician (General Surgery) [name], MD as Consulting Physician (Hematology and Oncology) [name], MD as Attending Physician (Radiation Oncology)  DIAGNOSIS:    ICD-10-CM   1. Malignant neoplasm of upper-outer quadrant of left breast in female, estrogen receptor positive ([place]) C50.412    Z17.0     SUMMARY OF ONCOLOGIC HISTORY:   Malignant neoplasm of upper-outer quadrant of left breast in female, estrogen receptor positive ([place])   09/14/2018 Initial Diagnosis    With prior history of breast implants that were ruptured/replaced, patient had cellulitis LIQ left breast for 1 week, and UOQ asymmetry 1.5 cm, MRI right breast biopsy benign, left breast numerous masses 7 cm skin thickening: 2 biopsies from left breast: Grade 2-3 IDC ER PR positive, HER-2 positive, Ki-67 50%    09/16/2018 Cancer Staging    Staging form: Breast, AJCC 8th Edition - Clinical stage from 09/16/2018: Stage IIIB (cT4, cN0, cM0, G3, ER+, PR+, HER2+) - Signed by [name], MD on 09/16/2018    09/29/2018 -  Neo-Adjuvant Chemotherapy    TCHP q 3 weeks     CHIEF COMPLIANT:  Cycle 2 TCHP  INTERVAL HISTORY: Katie [name] is a 73 y.o. with above-mentioned history of left breast cancer currently receiving neoadjuvant chemotherapy with TCHP. She had a liver MRI on 10/13/18 that showed the lesion in the gallbladder is likely a cyst. She presents to the clinic today with her husband. She reports her diarrhea is improved with Questran, but she still has loose stools alternaitng with regular stools. She  denies any nausea. She has been staying hydrated, drinking water, Gatorade, and Pedialyte. She has had dry eyes, with dark circles under her eyes for which her PCP gave her eye drops. All of her hair fell out two days ago. She reports occasional weakness and jittery feeling and has had anemia in the past and is interested in receiving blood today. She reviewed her medication list with me. Her labs from today show: WBC 3.4, Hg 8.5, platelets 163, ANC 1.9.  REVIEW OF SYSTEMS:   Constitutional: Denies fevers, chills or abnormal weight loss (+) hair loss (+) malaise  Eyes: Denies blurriness of vision (+) dry eyes Ears, nose, mouth, throat, and face: Denies mucositis or sore throat Respiratory: Denies cough, dyspnea or wheezes Cardiovascular: Denies palpitation, chest discomfort Gastrointestinal: Denies nausea, heartburn (+) diarrhea Skin: Denies abnormal skin rashes Lymphatics: Denies new lymphadenopathy or easy bruising Neurological: Denies numbness, tingling or new weaknesses Behavioral/Psych: Mood is stable, no new changes  Extremities: No lower extremity edema Breast: denies any pain or lumps or nodules in either breasts All other systems were reviewed with the patient and are negative.  I have reviewed the past medical history, past surgical history, social history and family history with the patient and they are unchanged from previous note.  ALLERGIES:  is allergic to erythromycin; ciprofloxacin; daypro [oxaprozin]; enalapril maleate; nsaids; and vioxx [rofecoxib].  MEDICATIONS:  Current Outpatient Medications  Medication Sig Dispense Refill  . acetaminophen (TYLENOL) 325 MG tablet Take 650 mg by mouth every 6 (six) hours as needed for mild pain or headache.    [name] amLODipine (NORVASC) 5 MG tablet Take 1 tablet (  5 mg total) by mouth daily. 30 tablet 11  . atorvastatin (LIPITOR) 10 MG tablet TAKE 1 TABLET(10 MG) BY MOUTH DAILY (Patient taking differently: Take 10 mg by mouth daily at 6 PM.  ) 90 tablet 0  . Azelastine HCl 137 MCG/SPRAY SOLN instill 1 spray into each nostril twice a day for 1 week then if needed 30 mL 5  . cholecalciferol (VITAMIN D) 400 units TABS tablet Take 400 Units by mouth 2 (two) times daily.    . cholestyramine (QUESTRAN) 4 g packet Take 1 packet (4 g total) by mouth 3 (three) times daily with meals. 60 each 12  . clobetasol (TEMOVATE) 0.05 % external solution Apply 1 application topically 2 (two) times daily as needed (scalp). Reported on 08/16/2015    . diphenoxylate-atropine (LOMOTIL) 2.5-0.025 MG tablet     . fluticasone (FLONASE) 50 MCG/ACT nasal spray instill 2 sprays into each nostril once daily (Patient taking differently: Place 2 sprays into both nostrils daily as needed for allergies. ) 16 g 5  . glucose blood (FREESTYLE LITE) test strip Use to check blood sugar daily.  Dx: E11.9 50 each 12  . lidocaine-prilocaine (EMLA) cream Apply to affected area once (Patient taking differently: Apply 1 application topically as needed (port access). ) 30 g 3  . loperamide (IMODIUM) 2 MG capsule Take 2 mg by mouth as needed for diarrhea or loose stools.    [name] loratadine (CLARITIN) 10 MG tablet Take 10 mg by mouth daily.    [name] LORazepam (ATIVAN) 0.5 MG tablet Take 1 tablet (0.5 mg total) by mouth at bedtime as needed for sleep. 30 tablet 0  . metFORMIN (GLUCOPHAGE-XR) 500 MG 24 hr tablet Take 1 tablet (500 mg total) by mouth daily with breakfast. 90 tablet 3  . metroNIDAZOLE (METROCREAM) 0.75 % cream Apply 1 application topically 2 (two) times daily as needed (rosacea).     . Multiple Vitamin (MULTIVITAMIN WITH MINERALS) TABS tablet Take 1 tablet by mouth daily.    . Olopatadine HCl 0.2 % SOLN 1 drop per eye daily 2.5 mL 0  . ondansetron (ZOFRAN) 8 MG tablet Take 1 tablet (8 mg total) by mouth 2 (two) times daily as needed (Nausea or vomiting). Begin 4 days after chemotherapy. 30 tablet 1  . prochlorperazine (COMPAZINE) 10 MG tablet Take 1 tablet (10 mg total) by mouth  every 6 (six) hours as needed (Nausea or vomiting). 30 tablet 1  . triamcinolone cream (KENALOG) 0.1 % Apply 1 application topically 2 (two) times daily. (Patient taking differently: Apply 1 application topically 2 (two) times daily as needed (skin rashes). ) 15 g 0  . valACYclovir (VALTREX) 500 MG tablet Take 1 tablet (500 mg total) by mouth 2 (two) times daily. (Patient taking differently: Take 500 mg by mouth as needed (flare up). ) 6 tablet 2   No current facility-administered medications for this visit.     PHYSICAL EXAMINATION: ECOG PERFORMANCE STATUS: 1 - Symptomatic but completely ambulatory  Vitals:   10/21/18 0830  BP: (!) 157/53  Pulse: 82  Resp: 16  Temp: 97.9 F (36.6 C)  SpO2: 95%   Filed Weights   10/21/18 0830  Weight: 168 lb 4.8 oz (76.3 kg)    GENERAL: alert, no distress and comfortable SKIN: skin color, texture, turgor are normal, no rashes or significant lesions EYES: normal, Conjunctiva are pink and non-injected, sclera clear OROPHARYNX: no exudate, no erythema and lips, buccal mucosa, and tongue normal  NECK: supple, thyroid normal size,  non-tender, without nodularity LYMPH: no palpable lymphadenopathy in the cervical, axillary or inguinal LUNGS: clear to auscultation and percussion with normal breathing effort HEART: regular rate & rhythm and no murmurs and no lower extremity edema ABDOMEN: abdomen soft, non-tender and normal bowel sounds MUSCULOSKELETAL: no cyanosis of digits and no clubbing  NEURO: alert & oriented x 3 with fluent speech, no focal motor/sensory deficits EXTREMITIES: No lower extremity edema  LABORATORY DATA:  I have reviewed the data as listed CMP Latest Ref Rng & Units 10/11/2018 10/10/2018 10/09/2018  Glucose 70 - 99 mg/dL 116(H) 116(H) 132(H)  BUN 8 - 23 mg/dL 17 40(H) 50(H)  Creatinine 0.44 - 1.00 mg/dL 1.04(H) 1.95(H) 3.00(H)  Sodium 135 - 145 mmol/L 136 132(L) 129(L)  Potassium 3.5 - 5.1 mmol/L 3.5 3.3(L) 3.3(L)  Chloride 98 -  111 mmol/L 109 103 99  CO2 22 - 32 mmol/L 22 20(L) 19(L)  Calcium 8.9 - 10.3 mg/dL 8.1(L) 8.1(L) 7.8(L)  Total Protein 6.5 - 8.1 g/dL 5.5(L) 5.4(L) -  Total Bilirubin 0.3 - 1.2 mg/dL 0.4 0.5 -  Alkaline Phos 38 - 126 U/L 69 70 -  AST 15 - 41 U/L 18 19 -  ALT 0 - 44 U/L 20 21 -    Lab Results  Component Value Date   WBC 3.4 (L) 10/21/2018   HGB 8.5 (L) 10/21/2018   HCT 25.3 (L) 10/21/2018   MCV 100.4 (H) 10/21/2018   PLT 163 10/21/2018   NEUTROABS 1.9 10/21/2018    ASSESSMENT & PLAN:  Malignant neoplasm of upper-outer quadrant of left breast in female, estrogen receptor positive ([place]) With prior history of breast implants that were ruptured/replaced, patient had cellulitis LIQ left breast for 1 week, and UOQ asymmetry 1.5 cm, MRI right breast biopsy benign, left breast numerous masses 7 cm skin thickening: 2 biopsies from left breast: Grade 2-3 IDC ER PR positive, HER-2 positive, Ki-67 50% Clinical stage: T4 N0 M0 stage IIIb  Treatment plan 1. Neoadjuvant chemotherapy with TCH Perjeta 6 cycles followed by Herceptin maintenance for 1 year 2. Followed by mastectomy with sentinel lymph node study 3. Followed by adjuvant radiation therapy ---------------------------------------------------------------  CT chest abdomen pelvis and bone scan: 09/28/2018: 3 mm lung nodule and 1 cm liver subcapsular lesion We will get an MRI of the liver. The lung nodule will need a CT in 3 months. She had a bone lesion on the bone scan which is related to her knee replacement surgery and does not need any additional work-up. ------------------------------------------------------------------- Current treatment: Cycle 2 day 1 neoadjuvant TCH Perjeta Chemo toxicities: 1.Severe diarrhea in spite of Imodium and Lomotil required hospitalization. discontinued Perjeta, currently also taking cholestyramine.  This appears to be helping her. 2.Severe fatigue 3.Leukopenia:  We reduced the dosage of cycle  2 of chemo, today's ANC is 1.9 4.  Nausea and vomiting: Finally subsided 5.  Anemia: With symptoms: We will type and cross and transfuse 2 units of packed red cells either today or tomorrow or even Saturday if we do not have time.  Labs have been reviewed Return to clinic  in 3 weeks for cycle 3    No orders of the defined types were placed in this encounter.  The patient has a good understanding of the overall plan. she agrees with it. she will call with any problems that may develop before the next visit here.  [name], MD 10/21/2018  Julious Oka Dorshimer am acting as scribe for Dr. [name].  I have reviewed the  above documentation for accuracy and completeness, and I agree with the above.

## 2018-10-21 ENCOUNTER — Inpatient Hospital Stay: Payer: Medicare Other

## 2018-10-21 ENCOUNTER — Other Ambulatory Visit: Payer: Self-pay

## 2018-10-21 ENCOUNTER — Inpatient Hospital Stay (HOSPITAL_BASED_OUTPATIENT_CLINIC_OR_DEPARTMENT_OTHER): Payer: Medicare Other | Admitting: Hematology and Oncology

## 2018-10-21 ENCOUNTER — Encounter: Payer: Self-pay | Admitting: *Deleted

## 2018-10-21 DIAGNOSIS — Z7984 Long term (current) use of oral hypoglycemic drugs: Secondary | ICD-10-CM | POA: Diagnosis not present

## 2018-10-21 DIAGNOSIS — Z79899 Other long term (current) drug therapy: Secondary | ICD-10-CM | POA: Diagnosis not present

## 2018-10-21 DIAGNOSIS — Z170 Estrogen receptor positive status [ER+]: Principal | ICD-10-CM

## 2018-10-21 DIAGNOSIS — D6481 Anemia due to antineoplastic chemotherapy: Secondary | ICD-10-CM

## 2018-10-21 DIAGNOSIS — E876 Hypokalemia: Secondary | ICD-10-CM

## 2018-10-21 DIAGNOSIS — Z5112 Encounter for antineoplastic immunotherapy: Secondary | ICD-10-CM | POA: Diagnosis not present

## 2018-10-21 DIAGNOSIS — Z95828 Presence of other vascular implants and grafts: Secondary | ICD-10-CM

## 2018-10-21 DIAGNOSIS — D72819 Decreased white blood cell count, unspecified: Secondary | ICD-10-CM | POA: Diagnosis not present

## 2018-10-21 DIAGNOSIS — T451X5A Adverse effect of antineoplastic and immunosuppressive drugs, initial encounter: Principal | ICD-10-CM

## 2018-10-21 DIAGNOSIS — N179 Acute kidney failure, unspecified: Secondary | ICD-10-CM

## 2018-10-21 DIAGNOSIS — E119 Type 2 diabetes mellitus without complications: Secondary | ICD-10-CM | POA: Diagnosis not present

## 2018-10-21 DIAGNOSIS — D649 Anemia, unspecified: Secondary | ICD-10-CM | POA: Diagnosis not present

## 2018-10-21 DIAGNOSIS — E871 Hypo-osmolality and hyponatremia: Secondary | ICD-10-CM | POA: Diagnosis not present

## 2018-10-21 DIAGNOSIS — C50412 Malignant neoplasm of upper-outer quadrant of left female breast: Secondary | ICD-10-CM

## 2018-10-21 DIAGNOSIS — Z5111 Encounter for antineoplastic chemotherapy: Secondary | ICD-10-CM | POA: Diagnosis not present

## 2018-10-21 LAB — CBC WITH DIFFERENTIAL (CANCER CENTER ONLY)
Abs Immature Granulocytes: 0.01 10*3/uL (ref 0.00–0.07)
Basophils Absolute: 0 10*3/uL (ref 0.0–0.1)
Basophils Relative: 1 %
Eosinophils Absolute: 0 10*3/uL (ref 0.0–0.5)
Eosinophils Relative: 1 %
HCT: 25.3 % — ABNORMAL LOW (ref 36.0–46.0)
Hemoglobin: 8.5 g/dL — ABNORMAL LOW (ref 12.0–15.0)
IMMATURE GRANULOCYTES: 0 %
Lymphocytes Relative: 30 %
Lymphs Abs: 1 10*3/uL (ref 0.7–4.0)
MCH: 33.7 pg (ref 26.0–34.0)
MCHC: 33.6 g/dL (ref 30.0–36.0)
MCV: 100.4 fL — ABNORMAL HIGH (ref 80.0–100.0)
Monocytes Absolute: 0.4 10*3/uL (ref 0.1–1.0)
Monocytes Relative: 12 %
NEUTROS PCT: 56 %
Neutro Abs: 1.9 10*3/uL (ref 1.7–7.7)
Platelet Count: 163 10*3/uL (ref 150–400)
RBC: 2.52 MIL/uL — ABNORMAL LOW (ref 3.87–5.11)
RDW: 13.5 % (ref 11.5–15.5)
WBC: 3.4 10*3/uL — AB (ref 4.0–10.5)
nRBC: 0 % (ref 0.0–0.2)

## 2018-10-21 LAB — CMP (CANCER CENTER ONLY)
ALT: 24 U/L (ref 0–44)
AST: 21 U/L (ref 15–41)
Albumin: 3.5 g/dL (ref 3.5–5.0)
Alkaline Phosphatase: 73 U/L (ref 38–126)
Anion gap: 9 (ref 5–15)
BUN: 6 mg/dL — ABNORMAL LOW (ref 8–23)
CO2: 22 mmol/L (ref 22–32)
Calcium: 8.7 mg/dL — ABNORMAL LOW (ref 8.9–10.3)
Chloride: 112 mmol/L — ABNORMAL HIGH (ref 98–111)
Creatinine: 0.79 mg/dL (ref 0.44–1.00)
GFR, Est AFR Am: >60 mL/min (ref >60)
GFR, Estimated: >60 mL/min (ref >60)
Glucose, Bld: 99 mg/dL (ref 70–99)
POTASSIUM: 3.4 mmol/L — AB (ref 3.5–5.1)
Sodium: 143 mmol/L (ref 135–145)
Total Bilirubin: 0.4 mg/dL (ref 0.3–1.2)
Total Protein: 6.3 g/dL — ABNORMAL LOW (ref 6.5–8.1)

## 2018-10-21 LAB — PREPARE RBC (CROSSMATCH)

## 2018-10-21 MED ORDER — SODIUM CHLORIDE 0.9 % IV SOLN: 20.0000 mg | Freq: Once | INTRAVENOUS | Status: DC

## 2018-10-21 MED ORDER — PALONOSETRON HCL INJECTION 0.25 MG/5ML
INTRAVENOUS | Status: AC
  Filled 2018-10-21: qty 5

## 2018-10-21 MED ORDER — TRASTUZUMAB CHEMO 150 MG IV SOLR
450.0000 mg | Freq: Once | INTRAVENOUS | Status: AC
  Administered 2018-10-21: 450 mg via INTRAVENOUS
  Filled 2018-10-21: qty 21.43

## 2018-10-21 MED ORDER — ACETAMINOPHEN 325 MG PO TABS
ORAL_TABLET | ORAL | Status: AC
  Filled 2018-10-21: qty 2

## 2018-10-21 MED ORDER — SODIUM CHLORIDE 0.9 % IV SOLN
20.0000 mg | Freq: Once | INTRAVENOUS | Status: AC
  Administered 2018-10-21: 20 mg via INTRAVENOUS
  Filled 2018-10-21: qty 2

## 2018-10-21 MED ORDER — SODIUM CHLORIDE 0.9% FLUSH
10.0000 mL | INTRAVENOUS | Status: DC | PRN
  Administered 2018-10-21: 10 mL
  Filled 2018-10-21: qty 10

## 2018-10-21 MED ORDER — PALONOSETRON HCL INJECTION 0.25 MG/5ML
0.2500 mg | Freq: Once | INTRAVENOUS | Status: AC
  Administered 2018-10-21: 0.25 mg via INTRAVENOUS

## 2018-10-21 MED ORDER — HEPARIN SOD (PORK) LOCK FLUSH 100 UNIT/ML IV SOLN
500.0000 [IU] | Freq: Once | INTRAVENOUS | Status: AC | PRN
  Administered 2018-10-21: 500 [IU]
  Filled 2018-10-21: qty 5

## 2018-10-21 MED ORDER — SODIUM CHLORIDE 0.9 % IV SOLN
425.0000 mg | Freq: Once | INTRAVENOUS | Status: AC
  Administered 2018-10-21: 430 mg via INTRAVENOUS
  Filled 2018-10-21: qty 43

## 2018-10-21 MED ORDER — SODIUM CHLORIDE 0.9% FLUSH
10.0000 mL | Freq: Once | INTRAVENOUS | Status: AC
  Administered 2018-10-21: 10 mL
  Filled 2018-10-21: qty 10

## 2018-10-21 MED ORDER — SODIUM CHLORIDE 0.9 % IV SOLN
Freq: Once | INTRAVENOUS | Status: AC
  Administered 2018-10-21: 09:00:00 via INTRAVENOUS
  Filled 2018-10-21: qty 5

## 2018-10-21 MED ORDER — SODIUM CHLORIDE 0.9 % IV SOLN
Freq: Once | INTRAVENOUS | Status: AC
  Administered 2018-10-21: 09:00:00 via INTRAVENOUS
  Filled 2018-10-21: qty 250

## 2018-10-21 MED ORDER — DIPHENHYDRAMINE HCL 25 MG PO CAPS
ORAL_CAPSULE | ORAL | Status: AC
  Filled 2018-10-21: qty 2

## 2018-10-21 MED ORDER — SODIUM CHLORIDE 0.9 % IV SOLN
60.0000 mg/m2 | Freq: Once | INTRAVENOUS | Status: AC
  Administered 2018-10-21: 110 mg via INTRAVENOUS
  Filled 2018-10-21: qty 11

## 2018-10-21 MED ORDER — DIPHENHYDRAMINE HCL 25 MG PO CAPS
50.0000 mg | ORAL_CAPSULE | Freq: Once | ORAL | Status: AC
  Administered 2018-10-21: 50 mg via ORAL

## 2018-10-21 MED ORDER — ACETAMINOPHEN 325 MG PO TABS
650.0000 mg | ORAL_TABLET | Freq: Once | ORAL | Status: AC
  Administered 2018-10-21: 650 mg via ORAL

## 2018-10-21 NOTE — Progress Notes (Signed)
Per Dr.Gudena, pt to receive 2 units prbc this week. Will send message to scheduling and confirm appt with pt.

## 2018-10-21 NOTE — Addendum Note (Signed)
Addended by: Thelma Barge MAY J on: 10/21/2018 11:12 AM   Modules accepted: Orders

## 2018-10-21 NOTE — Patient Instructions (Signed)
[facility] Discharge Instructions for Patients Receiving Chemotherapy  Today you received the following chemotherapy agents:  Herceptin, Taxotere, and Carboplatin.  To help prevent nausea and vomiting after your treatment, we encourage you to take your nausea medication as directed.   If you develop nausea and vomiting that is not controlled by your nausea medication, call the clinic.   BELOW ARE SYMPTOMS THAT SHOULD BE REPORTED IMMEDIATELY:  *FEVER GREATER THAN 100.5 F  *CHILLS WITH OR WITHOUT FEVER  NAUSEA AND VOMITING THAT IS NOT CONTROLLED WITH YOUR NAUSEA MEDICATION  *UNUSUAL SHORTNESS OF BREATH  *UNUSUAL BRUISING OR BLEEDING  TENDERNESS IN MOUTH AND THROAT WITH OR WITHOUT PRESENCE OF ULCERS  *URINARY PROBLEMS  *BOWEL PROBLEMS  UNUSUAL RASH Items with * indicate a potential emergency and should be followed up as soon as possible.  Feel free to call the clinic should you have any questions or concerns. The clinic phone number is [phone].  Please show the CHEMO ALERT CARD at check-in to the Emergency Department and triage nurse.   

## 2018-10-21 NOTE — Assessment & Plan Note (Addendum)
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 Neoadjuvant chemotherapy with TCH Perjeta 6 cycles followed by Herceptin maintenance for 1 year 2. Followed by mastectomy with sentinel lymph node study 3. Followed by adjuvant radiation therapy ---------------------------------------------------------------  CT chest abdomen pelvis and bone scan: 09/28/2018: 3 mm lung nodule and 1 cm liver subcapsular lesion We will get an MRI of the liver. The lung nodule will need a CT in 3 months. She had a bone lesion on the bone scan which is related to her knee replacement surgery and does not need any additional work-up. ------------------------------------------------------------------- Current treatment: Cycle 2 day 1 neoadjuvant TCH Perjeta Chemo toxicities: 1.Severe diarrhea in spite of Imodium and Lomotil required hospitalization. discontinued Perjeta 2.Severe fatigue 3.Leukopenia:  We reduced the dosage of cycle 2 of chemo 4.  Nausea and vomiting: Hopefully with cycle 2 the symptoms will be lower because of reduced dosage. 5.  Anemia: With symptoms: We will type and cross and transfuse 2 units of packed red cells either today or tomorrow or even Saturday if we do not have time.  Labs have been reviewed Return to clinic  in 3 weeks for cycle 3

## 2018-10-22 ENCOUNTER — Telehealth: Payer: Self-pay

## 2018-10-22 NOTE — Telephone Encounter (Signed)
Pt called today to notify nurse that she is doing well with having regular formed BM. She would like to know if she needs to keep taking [place] or stop it. Told pt to take it only 1x per day until tomorrow. If pt BM continues to be normal, she can stop by the weekend. Pt verbalized understanding.   Pt reports that she is experiencing very small amount of burning during urination. She has been drinking 8-10 glasses of water per day and would like to know if she will need a urine sample to screen for UTI tomorrow. Told pt to try taking oral probiotics, drink lots of fluids, monitor urine output, and discomfort. Also mentioned that pt may try taking azo for urinary discomfort. If pt symptoms persist, we can do UA to check for UTI tomorrow. Pt verbalized understanding. No further needs at this this time.

## 2018-10-23 ENCOUNTER — Inpatient Hospital Stay: Payer: Medicare Other

## 2018-10-23 ENCOUNTER — Ambulatory Visit: Payer: Medicare Other

## 2018-10-23 ENCOUNTER — Encounter: Payer: Medicare Other | Admitting: Family Medicine

## 2018-10-23 ENCOUNTER — Other Ambulatory Visit: Payer: Self-pay

## 2018-10-23 VITALS — BP 128/66 | HR 81 | Temp 97.8°F | Resp 20

## 2018-10-23 DIAGNOSIS — D6481 Anemia due to antineoplastic chemotherapy: Secondary | ICD-10-CM

## 2018-10-23 DIAGNOSIS — C50412 Malignant neoplasm of upper-outer quadrant of left female breast: Secondary | ICD-10-CM

## 2018-10-23 DIAGNOSIS — D72819 Decreased white blood cell count, unspecified: Secondary | ICD-10-CM | POA: Diagnosis not present

## 2018-10-23 DIAGNOSIS — T451X5A Adverse effect of antineoplastic and immunosuppressive drugs, initial encounter: Principal | ICD-10-CM

## 2018-10-23 DIAGNOSIS — Z170 Estrogen receptor positive status [ER+]: Secondary | ICD-10-CM

## 2018-10-23 DIAGNOSIS — D649 Anemia, unspecified: Secondary | ICD-10-CM | POA: Diagnosis not present

## 2018-10-23 DIAGNOSIS — Z95828 Presence of other vascular implants and grafts: Secondary | ICD-10-CM

## 2018-10-23 DIAGNOSIS — Z5112 Encounter for antineoplastic immunotherapy: Secondary | ICD-10-CM | POA: Diagnosis not present

## 2018-10-23 DIAGNOSIS — Z5111 Encounter for antineoplastic chemotherapy: Secondary | ICD-10-CM | POA: Diagnosis not present

## 2018-10-23 LAB — TYPE AND SCREEN
ABO/RH(D): A POS
Antibody Screen: POSITIVE
Donor AG Type: NEGATIVE
Donor AG Type: NEGATIVE
PT AG Type: NEGATIVE
Unit division: 0
Unit division: 0

## 2018-10-23 LAB — BPAM RBC
Blood Product Expiration Date: 202003212359
Blood Product Expiration Date: 202003242359
Unit Type and Rh: 600
Unit Type and Rh: 600

## 2018-10-23 LAB — PREPARE RBC (CROSSMATCH)

## 2018-10-23 MED ORDER — SODIUM CHLORIDE 0.9% FLUSH
10.0000 mL | Freq: Once | INTRAVENOUS | Status: AC
  Administered 2018-10-23: 10 mL
  Filled 2018-10-23: qty 10

## 2018-10-23 MED ORDER — ACETAMINOPHEN 325 MG PO TABS
ORAL_TABLET | ORAL | Status: AC
  Filled 2018-10-23: qty 2

## 2018-10-23 MED ORDER — HEPARIN SOD (PORK) LOCK FLUSH 100 UNIT/ML IV SOLN
500.0000 [IU] | Freq: Once | INTRAVENOUS | Status: AC
  Administered 2018-10-23: 500 [IU]
  Filled 2018-10-23: qty 5

## 2018-10-23 MED ORDER — DIPHENHYDRAMINE HCL 25 MG PO CAPS
ORAL_CAPSULE | ORAL | Status: AC
  Filled 2018-10-23: qty 1

## 2018-10-23 MED ORDER — DIPHENHYDRAMINE HCL 25 MG PO CAPS
25.0000 mg | ORAL_CAPSULE | Freq: Once | ORAL | Status: AC
  Administered 2018-10-23: 25 mg via ORAL

## 2018-10-23 MED ORDER — PEGFILGRASTIM-CBQV 6 MG/0.6ML ~~LOC~~ SOSY
PREFILLED_SYRINGE | SUBCUTANEOUS | Status: AC
  Filled 2018-10-23: qty 0.6

## 2018-10-23 MED ORDER — ACETAMINOPHEN 325 MG PO TABS
650.0000 mg | ORAL_TABLET | Freq: Once | ORAL | Status: AC
  Administered 2018-10-23: 650 mg via ORAL

## 2018-10-23 MED ORDER — SODIUM CHLORIDE 0.9% IV SOLUTION
250.0000 mL | Freq: Once | INTRAVENOUS | Status: AC
  Administered 2018-10-23: 250 mL via INTRAVENOUS
  Filled 2018-10-23: qty 250

## 2018-10-23 MED ORDER — PEGFILGRASTIM-CBQV 6 MG/0.6ML ~~LOC~~ SOSY
6.0000 mg | PREFILLED_SYRINGE | Freq: Once | SUBCUTANEOUS | Status: AC
  Administered 2018-10-23: 6 mg via SUBCUTANEOUS

## 2018-10-23 NOTE — Patient Instructions (Addendum)
Pegfilgrastim injection  What is this medicine?  PEGFILGRASTIM (PEG fil gra stim) is a long-acting granulocyte colony-stimulating factor that stimulates the growth of neutrophils, a type of white blood cell important in the body's fight against infection. It is used to reduce the incidence of fever and infection in patients with certain types of cancer who are receiving chemotherapy that affects the bone marrow, and to increase survival after being exposed to high doses of radiation.  This medicine may be used for other purposes; ask your health care provider or pharmacist if you have questions.  COMMON BRAND NAME(S): Fulphila, Neulasta, UDENYCA  What should I tell my health care provider before I take this medicine?  They need to know if you have any of these conditions:  -kidney disease  -latex allergy  -ongoing radiation therapy  -sickle cell disease  -skin reactions to acrylic adhesives (On-Body Injector only)  -an unusual or allergic reaction to pegfilgrastim, filgrastim, other medicines, foods, dyes, or preservatives  -pregnant or trying to get pregnant  -breast-feeding  How should I use this medicine?  This medicine is for injection under the skin. If you get this medicine at home, you will be taught how to prepare and give the pre-filled syringe or how to use the On-body Injector. Refer to the patient Instructions for Use for detailed instructions. Use exactly as directed. Tell your healthcare provider immediately if you suspect that the On-body Injector may not have performed as intended or if you suspect the use of the On-body Injector resulted in a missed or partial dose.  It is important that you put your used needles and syringes in a special sharps container. Do not put them in a trash can. If you do not have a sharps container, call your pharmacist or healthcare provider to get one.  Talk to your pediatrician regarding the use of this medicine in children. While this drug may be prescribed for  selected conditions, precautions do apply.  Overdosage: If you think you have taken too much of this medicine contact a poison control center or emergency room at once.  NOTE: This medicine is only for you. Do not share this medicine with others.  What if I miss a dose?  It is important not to miss your dose. Call your doctor or health care professional if you miss your dose. If you miss a dose due to an On-body Injector failure or leakage, a new dose should be administered as soon as possible using a single prefilled syringe for manual use.  What may interact with this medicine?  Interactions have not been studied.  Give your health care provider a list of all the medicines, herbs, non-prescription drugs, or dietary supplements you use. Also tell them if you smoke, drink alcohol, or use illegal drugs. Some items may interact with your medicine.  This list may not describe all possible interactions. Give your health care provider a list of all the medicines, herbs, non-prescription drugs, or dietary supplements you use. Also tell them if you smoke, drink alcohol, or use illegal drugs. Some items may interact with your medicine.  What should I watch for while using this medicine?  You may need blood work done while you are taking this medicine.  If you are going to need a MRI, CT scan, or other procedure, tell your doctor that you are using this medicine (On-Body Injector only).  What side effects may I notice from receiving this medicine?  Side effects that you should report to   your doctor or health care professional as soon as possible:  -allergic reactions like skin rash, itching or hives, swelling of the face, lips, or tongue  -back pain  -dizziness  -fever  -pain, redness, or irritation at site where injected  -pinpoint red spots on the skin  -red or dark-brown urine  -shortness of breath or breathing problems  -stomach or side pain, or pain at the shoulder  -swelling  -tiredness  -trouble passing urine or  change in the amount of urine  Side effects that usually do not require medical attention (report to your doctor or health care professional if they continue or are bothersome):  -bone pain  -muscle pain  This list may not describe all possible side effects. Call your doctor for medical advice about side effects. You may report side effects to FDA at 1-800-FDA-1088.  Where should I keep my medicine?  Keep out of the reach of children.  If you are using this medicine at home, you will be instructed on how to store it. Throw away any unused medicine after the expiration date on the label.  NOTE: This sheet is a summary. It may not cover all possible information. If you have questions about this medicine, talk to your doctor, pharmacist, or health care provider.   2019 Elsevier/Gold Standard (2017-11-17 16:57:08)

## 2018-10-23 NOTE — Progress Notes (Signed)
Patient had a Type and Screen drawn on 10/21/18. Due to antibodies patient was scheduled to return today for 2 units PRBC's. Patient removed blue blood bank bracelet after visit on 10/21/18. Type and Screen had to be redrawn today, new bracelet placed on patient, and due to antibodies, per Manuela Schwartz in blood bank, it will be at least 2:30 before blood is possibly ready. Patient opted to return for 2 units PRBC's on Saturday 10/24/18 at 8:30 am. Scheduling message sent. Manuela Schwartz in blood bank made aware. Patient aware and verbalized understanding to not remove blue blood bank bracelet and to come straight to the infusion room in the morning.

## 2018-10-24 ENCOUNTER — Inpatient Hospital Stay: Payer: Medicare Other

## 2018-10-24 DIAGNOSIS — Z170 Estrogen receptor positive status [ER+]: Secondary | ICD-10-CM | POA: Diagnosis not present

## 2018-10-24 DIAGNOSIS — C50412 Malignant neoplasm of upper-outer quadrant of left female breast: Secondary | ICD-10-CM | POA: Diagnosis not present

## 2018-10-24 DIAGNOSIS — Z5112 Encounter for antineoplastic immunotherapy: Secondary | ICD-10-CM | POA: Diagnosis not present

## 2018-10-24 DIAGNOSIS — Z5111 Encounter for antineoplastic chemotherapy: Secondary | ICD-10-CM | POA: Diagnosis not present

## 2018-10-24 DIAGNOSIS — D6481 Anemia due to antineoplastic chemotherapy: Secondary | ICD-10-CM

## 2018-10-24 DIAGNOSIS — T451X5A Adverse effect of antineoplastic and immunosuppressive drugs, initial encounter: Principal | ICD-10-CM

## 2018-10-24 DIAGNOSIS — D72819 Decreased white blood cell count, unspecified: Secondary | ICD-10-CM | POA: Diagnosis not present

## 2018-10-24 DIAGNOSIS — D649 Anemia, unspecified: Secondary | ICD-10-CM | POA: Diagnosis not present

## 2018-10-24 LAB — PREPARE RBC (CROSSMATCH)

## 2018-10-24 MED ORDER — SODIUM CHLORIDE 0.9% IV SOLUTION
250.0000 mL | Freq: Once | INTRAVENOUS | Status: AC
  Administered 2018-10-24: 250 mL via INTRAVENOUS
  Filled 2018-10-24: qty 250

## 2018-10-24 MED ORDER — ACETAMINOPHEN 325 MG PO TABS
ORAL_TABLET | ORAL | Status: AC
  Filled 2018-10-24: qty 2

## 2018-10-24 MED ORDER — DIPHENHYDRAMINE HCL 25 MG PO CAPS
ORAL_CAPSULE | ORAL | Status: AC
  Filled 2018-10-24: qty 2

## 2018-10-24 MED ORDER — HEPARIN SOD (PORK) LOCK FLUSH 100 UNIT/ML IV SOLN
500.0000 [IU] | Freq: Every day | INTRAVENOUS | Status: AC | PRN
  Administered 2018-10-24: 500 [IU]
  Filled 2018-10-24: qty 5

## 2018-10-24 MED ORDER — SODIUM CHLORIDE 0.9% FLUSH
10.0000 mL | INTRAVENOUS | Status: AC | PRN
  Administered 2018-10-24: 10 mL
  Filled 2018-10-24: qty 10

## 2018-10-24 MED ORDER — ACETAMINOPHEN 325 MG PO TABS
650.0000 mg | ORAL_TABLET | Freq: Once | ORAL | Status: AC
  Administered 2018-10-24: 650 mg via ORAL

## 2018-10-24 MED ORDER — DIPHENHYDRAMINE HCL 25 MG PO CAPS
25.0000 mg | ORAL_CAPSULE | Freq: Once | ORAL | Status: AC
  Administered 2018-10-24: 25 mg via ORAL

## 2018-10-24 NOTE — Patient Instructions (Signed)
Blood Transfusion, Adult, Care After This sheet gives you information about how to care for yourself after your procedure. Your doctor may also give you more specific instructions. If you have problems or questions, contact your doctor. Follow these instructions at home:   Take over-the-counter and prescription medicines only as told by your doctor.  Go back to your normal activities as told by your doctor.  Follow instructions from your doctor about how to take care of the area where an IV tube was put into your vein (insertion site). Make sure you: ? Wash your hands with soap and water before you change your bandage (dressing). If there is no soap and water, use hand sanitizer. ? Change your bandage as told by your doctor.  Check your IV insertion site every day for signs of infection. Check for: ? More redness, swelling, or pain. ? More fluid or blood. ? Warmth. ? Pus or a bad smell. Contact a doctor if:  You have more redness, swelling, or pain around the IV insertion site.  You have more fluid or blood coming from the IV insertion site.  Your IV insertion site feels warm to the touch.  You have pus or a bad smell coming from the IV insertion site.  Your pee (urine) turns pink, red, or brown.  You feel weak after doing your normal activities. Get help right away if:  You have signs of a serious allergic or body defense (immune) system reaction, including: ? Itchiness. ? Hives. ? Trouble breathing. ? Anxiety. ? Pain in your chest or lower back. ? Fever, flushing, and chills. ? Fast pulse. ? Rash. ? Watery poop (diarrhea). ? Throwing up (vomiting). ? Dark pee. ? Serious headache. ? Dizziness. ? Stiff neck. ? Yellow color in your face or the white parts of your eyes (jaundice). Summary  After a blood transfusion, return to your normal activities as told by your doctor.  Every day, check for signs of infection where the IV tube was put into your vein.  Some  signs of infection are warm skin, more redness and pain, more fluid or blood, and pus or a bad smell where the needle went in.  Contact your doctor if you feel weak or have any unusual symptoms. This information is not intended to replace advice given to you by your health care provider. Make sure you discuss any questions you have with your health care provider. Document Released: 09/02/2014 Document Revised: 04/05/2016 Document Reviewed: 04/05/2016 Elsevier Interactive Patient Education  2019 [employer]vier Inc.  

## 2018-10-26 LAB — BPAM RBC
Blood Product Expiration Date: 202003212359
Blood Product Expiration Date: 202003242359
ISSUE DATE / TIME: 202002290808
ISSUE DATE / TIME: 202002290808
Unit Type and Rh: 600
Unit Type and Rh: 600

## 2018-10-26 LAB — TYPE AND SCREEN
ABO/RH(D): A POS
Antibody Screen: POSITIVE
Donor AG Type: NEGATIVE
Donor AG Type: NEGATIVE
Unit division: 0
Unit division: 0

## 2018-10-28 ENCOUNTER — Encounter: Payer: Self-pay | Admitting: Adult Health

## 2018-10-28 ENCOUNTER — Inpatient Hospital Stay: Payer: Medicare Other | Attending: Hematology and Oncology

## 2018-10-28 ENCOUNTER — Inpatient Hospital Stay: Payer: Medicare Other

## 2018-10-28 ENCOUNTER — Inpatient Hospital Stay (HOSPITAL_BASED_OUTPATIENT_CLINIC_OR_DEPARTMENT_OTHER): Payer: Medicare Other | Admitting: Adult Health

## 2018-10-28 ENCOUNTER — Ambulatory Visit: Payer: Medicare Other

## 2018-10-28 ENCOUNTER — Telehealth: Payer: Self-pay | Admitting: Hematology and Oncology

## 2018-10-28 VITALS — BP 155/64 | HR 87 | Temp 97.7°F | Resp 18 | Ht 62.0 in | Wt 159.3 lb

## 2018-10-28 DIAGNOSIS — Z5112 Encounter for antineoplastic immunotherapy: Secondary | ICD-10-CM | POA: Insufficient documentation

## 2018-10-28 DIAGNOSIS — Z5189 Encounter for other specified aftercare: Secondary | ICD-10-CM | POA: Diagnosis not present

## 2018-10-28 DIAGNOSIS — G629 Polyneuropathy, unspecified: Secondary | ICD-10-CM | POA: Diagnosis not present

## 2018-10-28 DIAGNOSIS — Z170 Estrogen receptor positive status [ER+]: Secondary | ICD-10-CM | POA: Diagnosis not present

## 2018-10-28 DIAGNOSIS — E876 Hypokalemia: Secondary | ICD-10-CM | POA: Diagnosis not present

## 2018-10-28 DIAGNOSIS — C50412 Malignant neoplasm of upper-outer quadrant of left female breast: Secondary | ICD-10-CM

## 2018-10-28 DIAGNOSIS — E1140 Type 2 diabetes mellitus with diabetic neuropathy, unspecified: Secondary | ICD-10-CM | POA: Diagnosis not present

## 2018-10-28 DIAGNOSIS — R197 Diarrhea, unspecified: Secondary | ICD-10-CM

## 2018-10-28 DIAGNOSIS — Z79899 Other long term (current) drug therapy: Secondary | ICD-10-CM | POA: Diagnosis not present

## 2018-10-28 DIAGNOSIS — Z95828 Presence of other vascular implants and grafts: Secondary | ICD-10-CM

## 2018-10-28 DIAGNOSIS — Z5111 Encounter for antineoplastic chemotherapy: Secondary | ICD-10-CM | POA: Diagnosis not present

## 2018-10-28 DIAGNOSIS — D649 Anemia, unspecified: Secondary | ICD-10-CM | POA: Insufficient documentation

## 2018-10-28 LAB — CBC WITH DIFFERENTIAL (CANCER CENTER ONLY)
Abs Immature Granulocytes: 0.23 10*3/uL — ABNORMAL HIGH (ref 0.00–0.07)
Basophils Absolute: 0.1 10*3/uL (ref 0.0–0.1)
Basophils Relative: 2 %
Eosinophils Absolute: 0.1 10*3/uL (ref 0.0–0.5)
Eosinophils Relative: 3 %
HCT: 35.1 % — ABNORMAL LOW (ref 36.0–46.0)
Hemoglobin: 11.8 g/dL — ABNORMAL LOW (ref 12.0–15.0)
Immature Granulocytes: 8 %
Lymphocytes Relative: 29 %
Lymphs Abs: 0.8 10*3/uL (ref 0.7–4.0)
MCH: 31.9 pg (ref 26.0–34.0)
MCHC: 33.6 g/dL (ref 30.0–36.0)
MCV: 94.9 fL (ref 80.0–100.0)
Monocytes Absolute: 0.7 10*3/uL (ref 0.1–1.0)
Monocytes Relative: 26 %
Neutro Abs: 0.9 10*3/uL — ABNORMAL LOW (ref 1.7–7.7)
Neutrophils Relative %: 32 %
PLATELETS: 107 10*3/uL — AB (ref 150–400)
RBC: 3.7 MIL/uL — AB (ref 3.87–5.11)
RDW: 13.9 % (ref 11.5–15.5)
WBC Count: 2.7 10*3/uL — ABNORMAL LOW (ref 4.0–10.5)
nRBC: 0 % (ref 0.0–0.2)

## 2018-10-28 LAB — CMP (CANCER CENTER ONLY)
ALT: 17 U/L (ref 0–44)
AST: 16 U/L (ref 15–41)
Albumin: 3.7 g/dL (ref 3.5–5.0)
Alkaline Phosphatase: 78 U/L (ref 38–126)
Anion gap: 9 (ref 5–15)
BUN: 6 mg/dL — ABNORMAL LOW (ref 8–23)
CO2: 25 mmol/L (ref 22–32)
Calcium: 8.7 mg/dL — ABNORMAL LOW (ref 8.9–10.3)
Chloride: 106 mmol/L (ref 98–111)
Creatinine: 0.82 mg/dL (ref 0.44–1.00)
GFR, Est AFR Am: >60 mL/min (ref >60)
GFR, Estimated: >60 mL/min (ref >60)
Glucose, Bld: 125 mg/dL — ABNORMAL HIGH (ref 70–99)
POTASSIUM: 3.3 mmol/L — AB (ref 3.5–5.1)
Sodium: 140 mmol/L (ref 135–145)
Total Bilirubin: 0.8 mg/dL (ref 0.3–1.2)
Total Protein: 6.7 g/dL (ref 6.5–8.1)

## 2018-10-28 MED ORDER — SODIUM CHLORIDE 0.9 % IV SOLN
INTRAVENOUS | Status: DC
  Administered 2018-10-28: 11:00:00 via INTRAVENOUS
  Filled 2018-10-28 (×2): qty 250

## 2018-10-28 MED ORDER — POTASSIUM CHLORIDE CRYS ER 20 MEQ PO TBCR: 40.0000 meq | EXTENDED_RELEASE_TABLET | Freq: Once | ORAL | Status: DC

## 2018-10-28 MED ORDER — POTASSIUM CHLORIDE CRYS ER 20 MEQ PO TBCR
40.0000 meq | EXTENDED_RELEASE_TABLET | Freq: Once | ORAL | Status: AC
  Administered 2018-10-28: 40 meq via ORAL

## 2018-10-28 MED ORDER — HEPARIN SOD (PORK) LOCK FLUSH 100 UNIT/ML IV SOLN
500.0000 [IU] | Freq: Once | INTRAVENOUS | Status: AC
  Administered 2018-10-28: 500 [IU]
  Filled 2018-10-28: qty 5

## 2018-10-28 MED ORDER — SODIUM CHLORIDE 0.9% FLUSH
10.0000 mL | Freq: Once | INTRAVENOUS | Status: AC
  Administered 2018-10-28: 10 mL
  Filled 2018-10-28: qty 10

## 2018-10-28 MED ORDER — SODIUM CHLORIDE 0.9 % IV SOLN
Freq: Once | INTRAVENOUS | Status: DC
  Filled 2018-10-28: qty 250

## 2018-10-28 MED ORDER — POTASSIUM CHLORIDE CRYS ER 20 MEQ PO TBCR
EXTENDED_RELEASE_TABLET | ORAL | Status: AC
  Filled 2018-10-28: qty 2

## 2018-10-28 NOTE — Patient Instructions (Signed)
Hypokalemia Hypokalemia means that the amount of potassium in the blood is lower than normal.Potassium is a chemical that helps regulate the amount of fluid in the body (electrolyte). It also stimulates muscle tightening (contraction) and helps nerves work properly.Normally, most of the body's potassium is inside of cells, and only a very small amount is in the blood. Because the amount in the blood is so small, minor changes to potassium levels in the blood can be life-threatening. What are the causes? This condition may be caused by:  Antibiotic medicine.  Diarrhea or vomiting. Taking too much of a medicine that helps you have a bowel movement (laxative) can cause diarrhea and lead to hypokalemia.  Chronic kidney disease (CKD).  Medicines that help the body get rid of excess fluid (diuretics).  Eating disorders, such as bulimia.  Low magnesium levels in the body.  Sweating a lot. What are the signs or symptoms? Symptoms of this condition include:  Weakness.  Constipation.  Fatigue.  Muscle cramps.  Mental confusion.  Skipped heartbeats or irregular heartbeat (palpitations).  Tingling or numbness. How is this diagnosed? This condition is diagnosed with a blood test. How is this treated? Hypokalemia can be treated by taking potassium supplements by mouth or adjusting the medicines that you take. Treatment may also include eating more foods that contain a lot of potassium. If your potassium level is very low, you may need to get potassium through an IV tube in one of your veins and be monitored in the hospital. Follow these instructions at home:   Take over-the-counter and prescription medicines only as told by your health care provider. This includes vitamins and supplements.  Eat a healthy diet. A healthy diet includes fresh fruits and vegetables, whole grains, healthy fats, and lean proteins.  If instructed, eat more foods that contain a lot of potassium, such  as: ? Nuts, such as peanuts and pistachios. ? Seeds, such as sunflower seeds and pumpkin seeds. ? Peas, lentils, and lima beans. ? Whole grain and bran cereals and breads. ? Fresh fruits and vegetables, such as apricots, avocado, bananas, cantaloupe, kiwi, oranges, tomatoes, asparagus, and potatoes. ? Orange juice. ? Tomato juice. ? Red meats. ? Yogurt.  Keep all follow-up visits as told by your health care provider. This is important. Contact a health care provider if:  You have weakness that gets worse.  You feel your heart pounding or racing.  You vomit.  You have diarrhea.  You have diabetes (diabetes mellitus) and you have trouble keeping your blood sugar (glucose) in your target range. Get help right away if:  You have chest pain.  You have shortness of breath.  You have vomiting or diarrhea that lasts for more than 2 days.  You faint. This information is not intended to replace advice given to you by your health care provider. Make sure you discuss any questions you have with your health care provider. Document Released: 08/12/2005 Document Revised: 03/30/2016 Document Reviewed: 03/30/2016 Elsevier Interactive Patient Education  2019 [employer]    Neutropenia Neutropenia is a condition that occurs when you have a lower-than-normal level of a type of white blood cell (neutrophil) in your body. Neutrophils are made in the spongy center of large bones (bone marrow) and they fight infections. Neutrophils are your body's main defense against bacterial and fungal infections. The fewer neutrophils you have and the longer your body remains without them, the greater your risk of getting a severe infection. What are the causes? This condition can  occur if your body uses up or destroys neutrophils faster than your bone marrow can make them. This problem may happen because of:  Bacterial or fungal infection.  Allergic disorders.  Reactions to some  medicines.  Autoimmune disease.  An enlarged spleen. This condition can also occur if your bone marrow does not produce enough neutrophils. This problem may be caused by:  Cancer.  Cancer treatments, such as radiation or chemotherapy.  Viral infections.  Medicines, such as phenytoin.  Vitamin B12 deficiency.  Diseases of the bone marrow.  Environmental toxins, such as insecticides. What are the signs or symptoms? This condition does not usually cause symptoms. If symptoms are present, they are usually caused by an underlying infection. Symptoms of an infection may include:  Fever.  Chills.  Swollen glands.  Oral or anal ulcers.  Cough and shortness of breath.  Rash.  Skin infection.  Fatigue. How is this diagnosed? Your health care provider may suspect neutropenia if you have:  A condition that may cause neutropenia.  Symptoms of infection, especially fever.  Frequent and unusual infections. You will have a medical history and physical exam. Tests will also be done, such as:  A complete blood count (CBC).  A procedure to collect a sample of bone marrow for examination (bone marrow biopsy).  A chest X-ray.  A urine culture.  A blood culture. How is this treated? Treatment depends on the underlying cause and severity of your condition. Mild neutropenia may not require treatment. Treatment may include medicines, such as:  Antibiotic medicine given through an IV tube.  Antiviral medicines.  Antifungal medicines.  A medicine to increase neutrophil production (colony-stimulating factor). You may get this drug through an IV tube or by injection.  Steroids given through an IV tube. If an underlying condition is causing neutropenia, you may need treatment for that condition. If medicines you are taking are causing neutropenia, your health care provider may have you stop taking those medicines. Follow these instructions at home: Medicines   Take  over-the-counter and prescription medicines only as told by your health care provider.  Get a seasonal flu shot (influenza vaccine). Lifestyle  Do not eat unpasteurized foods.Do not eat unwashed raw fruits or vegetables.  Avoid exposure to groups of people or children.  Avoid being around people who are sick.  Avoid being around dirt or dust, such as in construction areas or gardens.  Do not provide direct care for pets. Avoid animal droppings. Do not clean litter boxes and bird cages. Hygiene   Bathe daily.  Clean the area between the genitals and the anus (perineal area) after you urinate or have a bowel movement. If you are female, wipe from front to back.  Brush your teeth with a soft toothbrush before and after meals.  Do not use a razor that has a blade. Use an electric razor to remove hair.  Wash your hands often. Make sure others who come in contact with you also wash their hands. If soap and water are not available, use hand sanitizer. General instructions  Do not have sex unless your health care provider has approved.  Take actions to avoid cuts and burns. For example: ? Be cautious when you use knives. Always cut away from yourself. ? Keep knives in protective sheaths or guards when not in use. ? Use oven mitts when you cook with a hot stove, oven, or grill. ? Stand a safe distance away from open fires.  Avoid people who received a vaccine  in the past 30 days if that vaccine contained a live version of the germ (live vaccine). You should not get a live vaccine. Common live vaccines are varicella, measles, mumps, and rubella.  Do not share food utensils.  Do not use tampons, enemas, or rectal suppositories unless your health care provider has approved.  Keep all appointments as told by your health care provider. This is important. Contact a health care provider if:  You have a fever.  You have chills or you start to shake.  You have: ? A sore throat. ? A  warm, red, or tender area on your skin. ? A cough. ? Frequent or painful urination. ? Vaginal discharge or itching.  You develop: ? Sores in your mouth or anus. ? Swollen lymph nodes. ? Red streaks on the skin. ? A rash.  You feel: ? Nauseous or you vomit. ? Very fatigued. ? Short of breath. This information is not intended to replace advice given to you by your health care provider. Make sure you discuss any questions you have with your health care provider. Document Released: 02/01/2002 Document Revised: 04/08/2018 Document Reviewed: 02/22/2015 Elsevier Interactive Patient Education  2019 [employer]    Dehydration, Adult  Dehydration is when there is not enough fluid or water in your body. This happens when you lose more fluids than you take in. Dehydration can range from mild to very bad. It should be treated right away to keep it from getting very bad. Symptoms of mild dehydration may include:  Thirst.  Dry lips.  Slightly dry mouth.  Dry, warm skin.  Dizziness. Symptoms of moderate dehydration may include:  Very dry mouth.  Muscle cramps.  Dark pee (urine). Pee may be the color of tea.  Your body making less pee.  Your eyes making fewer tears.  Heartbeat that is uneven or faster than normal (palpitations).  Headache.  Light-headedness, especially when you stand up from sitting.  Fainting (syncope). Symptoms of very bad dehydration may include:  Changes in skin, such as: ? Cold and clammy skin. ? Blotchy (mottled) or pale skin. ? Skin that does not quickly return to normal after being lightly pinched and let go (poor skin turgor).  Changes in body fluids, such as: ? Feeling very thirsty. ? Your eyes making fewer tears. ? Not sweating when body temperature is high, such as in hot weather. ? Your body making very little pee.  Changes in vital signs, such as: ? Weak pulse. ? Pulse that is more than 100 beats a minute when you are sitting  still. ? Fast breathing. ? Low blood pressure.  Other changes, such as: ? Sunken eyes. ? Cold hands and feet. ? Confusion. ? Lack of energy (lethargy). ? Trouble waking up from sleep. ? Short-term weight loss. ? Unconsciousness. Follow these instructions at home:   If told by your doctor, drink an ORS: ? Make an ORS by using instructions on the package. ? Start by drinking small amounts, about  cup (120 mL) every 5-10 minutes. ? Slowly drink more until you have had the amount that your doctor said to have.  Drink enough clear fluid to keep your pee clear or pale yellow. If you were told to drink an ORS, finish the ORS first, then start slowly drinking clear fluids. Drink fluids such as: ? Water. Do not drink only water by itself. Doing that can make the salt (sodium) level in your body get too low (hyponatremia). ? Ice chips. ? Fruit  juice that you have added water to (diluted). ? Low-calorie sports drinks.  Avoid: ? Alcohol. ? Drinks that have a lot of sugar. These include high-calorie sports drinks, fruit juice that does not have water added, and soda. ? Caffeine. ? Foods that are greasy or have a lot of fat or sugar.  Take over-the-counter and prescription medicines only as told by your doctor.  Do not take salt tablets. Doing that can make the salt level in your body get too high (hypernatremia).  Eat foods that have minerals (electrolytes). Examples include bananas, oranges, potatoes, tomatoes, and spinach.  Keep all follow-up visits as told by your doctor. This is important. Contact a doctor if:  You have belly (abdominal) pain that: ? Gets worse. ? Stays in one area (localizes).  You have a rash.  You have a stiff neck.  You get angry or annoyed more easily than normal (irritability).  You are more sleepy than normal.  You have a harder time waking up than normal.  You feel: ? Weak. ? Dizzy. ? Very thirsty.  You have peed (urinated) only a small  amount of very dark pee during 6-8 hours. Get help right away if:  You have symptoms of very bad dehydration.  You cannot drink fluids without throwing up (vomiting).  Your symptoms get worse with treatment.  You have a fever.  You have a very bad headache.  You are throwing up or having watery poop (diarrhea) and it: ? Gets worse. ? Does not go away.  You have blood or something green (bile) in your throw-up.  You have blood in your poop (stool). This may cause poop to look black and tarry.  You have not peed in 6-8 hours.  You pass out (faint).  Your heart rate when you are sitting still is more than 100 beats a minute.  You have trouble breathing. This information is not intended to replace advice given to you by your health care provider. Make sure you discuss any questions you have with your health care provider. Document Released: 06/08/2009 Document Revised: 03/01/2016 Document Reviewed: 10/06/2015 Elsevier Interactive Patient Education  2019 [employer].

## 2018-10-28 NOTE — Telephone Encounter (Signed)
Gave avs and calendar ° °

## 2018-10-28 NOTE — Assessment & Plan Note (Addendum)
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 Neoadjuvant chemotherapy with TCH Perjeta 6 cycles followed by Herceptin maintenance for 1 year 2. Followed by mastectomy with sentinel lymph node study 3. Followed by adjuvant radiation therapy ---------------------------------------------------------------  CT chest abdomen pelvis and bone scan: 09/28/2018: 3 mm lung nodule and 1 cm liver subcapsular lesion MRI liver on 2/18 shows benign cystic lesion. The lung nodule will need a CT in 12/2018. She had a bone lesion on the bone scan which is related to her knee replacement surgery and does not need any additional work-up. ------------------------------------------------------------------- Current treatment: Cycle 2 day 8 neoadjuvant TCH Perjeta Chemo toxicities: 1.Severe diarrhea: Perjeta discontinued, taking Questran and Lomotil, counseled on appropriate way to take Lomotil.  Will receive IV fluids today. 2.Fatigue: improved since blood transfusion 3.Leukopenia:  Taxotere and Carbo were dose reduced with cycle 2.  Her nadir ANC today is 0.9.   4.  Nausea and vomiting: Much improved, only experienced once, and resolved with anti emetics. 5.  Anemia: Received 2 units of PRBCs last Saturday, hemoglobin is 11.8 today. 6. Peripheral neuropathy: mild and intermittent, will monitor for now 7. Hypokalemia: oral potassium ordered for during infusion 8. H/o diverticulitis: She knows to call us if she develops symptoms.  Labs have been reviewed Return to clinic  in 2 weeks for cycle 3

## 2018-10-28 NOTE — Progress Notes (Signed)
[facility] Follow up:    Katie Sanders, MD [place] [address]   DIAGNOSIS: Cancer Staging Malignant neoplasm of upper-outer quadrant of left breast in female, estrogen receptor positive ([place]) Staging form: Breast, AJCC 8th Edition - Clinical stage from 09/16/2018: Stage IIIB (cT4, cN0, cM0, G3, ER+, PR+, HER2+) - Signed by [name], MD on 09/16/2018   SUMMARY OF ONCOLOGIC HISTORY:   Malignant neoplasm of upper-outer quadrant of left breast in female, estrogen receptor positive ([place])   09/14/2018 Initial Diagnosis    With prior history of breast implants that were ruptured/replaced, patient had cellulitis LIQ left breast for 1 week, and UOQ asymmetry 1.5 cm, MRI right breast biopsy benign, left breast numerous masses 7 cm skin thickening: 2 biopsies from left breast: Grade 2-3 IDC ER PR positive, HER-2 positive, Ki-67 50%    09/16/2018 Cancer Staging    Staging form: Breast, AJCC 8th Edition - Clinical stage from 09/16/2018: Stage IIIB (cT4, cN0, cM0, G3, ER+, PR+, HER2+) - Signed by [name], MD on 09/16/2018    09/29/2018 -  Neo-Adjuvant Chemotherapy    TCHP q 3 weeks     CURRENT THERAPY: TCHP  INTERVAL HISTORY: Katie [name] 73 y.o. female returns for evaluation after receiving cycle 2 of TCHP.  Perjeta was discontinued due to diarrhea and the Taxotere and Carboplatin was dose reduced as well.  She was anemic last week and received PRBCs last Saturday.  Katie [name] has had some diarrhea since her last visit.  She had loose bowel movements about 3-4 a day.  She was able to keep up with her fluids until yesterday, she noted that the watery diarrhea started late Monday to yesterday and she has lost an increasing amount of fluids.  She is taking Questran, and took it twice yesterday, however otherwise started it automatically last week when she received her chemo and was taking it three times per day.  She also takes Lomotil when she has a  loose stool.  She was given instruction about the Lomotil that was really instructions for Loperamide, and she was taking two tablets for every loose stool, to a max of four tablets per day.    She is having a difficult time palpating her previously biopsied breast cancer in her breast.  She notes mild intermittent peripheral neuropathy in the tips of her fingers without motor deficits.  She is having no neuropathy in her toes.  She notes she has a h/o diverticulitis.  She is not having any LLQ pain or tenderness.    Patient Active Problem List   Diagnosis Date Noted  . Diarrhea 10/15/2018  . Dehydration 10/15/2018  . Anemia due to chemotherapy 10/15/2018  . AKI (acute kidney injury) ([place]) 10/09/2018  . Port-A-Cath in place 09/29/2018  . Diabetic retinopathy ([place]) 09/29/2018  . Genetic testing 09/23/2018  . Family history of leukemia 09/16/2018  . Family history of breast cancer   . Malignant neoplasm of upper-outer quadrant of left breast in female, estrogen receptor positive ([place]) 09/11/2018  . Mastitis 08/28/2018  . H/O bilateral breast implants 08/28/2018  . Family history of breast cancer in sister 08/28/2018  . Pre-op examination 02/17/2018  . Diverticular disease 10/17/2017  . Obesity (BMI 30-39.9) 09/02/2017  . Primary osteoarthritis of right knee 03/07/2017  . 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 Vitamin D deficiency 10/11/2016  . Family history of thyroid disorder 04/04/2015  . Osteopenia 10/04/2014  .  Counseling regarding end of life decision making 10/04/2014  . 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 History of duodenal ulcer 08/02/2014  . Seasonal allergies 08/02/2014  . Essential hypertension, benign 08/02/2014  . High cholesterol 08/02/2014  . 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 History of joint replacement 03/01/2014  . Controlled type 2 diabetes with retinopathy ([place]) 11/07/2009    is allergic to erythromycin; ciprofloxacin; daypro  [oxaprozin]; enalapril maleate; nsaids; and vioxx [rofecoxib].  MEDICAL HISTORY: Past Medical History:  Diagnosis Date  . Allergy   . Anemia   . Blood transfusion without reported diagnosis   . Diverticulitis   . DJD (degenerative joint disease)   . DM type 2 (diabetes mellitus, type 2) ([place])   . Family history of breast cancer   . Fibrocystic breast disease   . GERD (gastroesophageal reflux disease)   . History of chicken pox   . Hypercholesteremia   . Hypertension   . Lichen planopilaris   . Urinary incontinence     SURGICAL HISTORY: Past Surgical History:  Procedure Laterality Date  . BREAST SURGERY  02/1975   Left Breast Biopsy-Fibrocystic Disease  . BREAST SURGERY  10/1981   Bialteral Capsulectomy Breast Surgery  . BREAST SURGERY  07/1998   Replace Bilateral Silicone Implants  . BREAST SURGERY  01/1976   Bilater breast surgery to remove fibrocystic tissue and replace with silicone implants  . CHOLECYSTECTOMY N/A 07/08/2016   Procedure: LAPAROSCOPIC CHOLECYSTECTOMY;  Surgeon: [name], MD;  Location: [facility] ORS;  Service: General;  Laterality: N/A;  . COLONOSCOPY WITH PROPOFOL N/A 11/07/2017   Procedure: COLONOSCOPY WITH PROPOFOL;  Surgeon: Jonathon Bellows, MD;  Location: [facility] ENDOSCOPY;  Service: Gastroenterology;  Laterality: N/A;  . DILATION AND CURETTAGE OF UTERUS  01/1994  . ESOPHAGOGASTRODUODENOSCOPY (EGD) WITH PROPOFOL N/A 12/05/2016   Procedure: ESOPHAGOGASTRODUODENOSCOPY (EGD) WITH PROPOFOL;  Surgeon: Jonathon Bellows, MD;  Location: [facility] ENDOSCOPY;  Service: Endoscopy;  Laterality: N/A;  . FINGER SURGERY  2001 & 2003   Trigger Finger  . FOOT SURGERY  1980   To Relieve Pinched Nerve  . FOOT SURGERY  07/2008   Left Foot-Plantar Fasciitis  . JOINT REPLACEMENT  04/19/2013   Hip Replacement-Right  . PLACEMENT OF BREAST IMPLANTS  12/26/2004   Replace Failed Implants  . RHINOPLASTY  03/1964   To Correct Deviated Septum    SOCIAL HISTORY: Social History    Socioeconomic History  . Marital status: Married    Spouse name: Not on file  . Number of children: 1  . Years of education: Not on file  . Highest education level: Not on file  Occupational History  . Occupation: Retired  Scientific laboratory technician  . Financial resource strain: Not on file  . Food insecurity:    Worry: Not on file    Inability: Not on file  . Transportation needs:    Medical: Not on file    Non-medical: Not on file  Tobacco Use  . Smoking status: Never Smoker  . Smokeless tobacco: Never Used  Substance and Sexual Activity  . Alcohol use: No    Alcohol/week: 0.0 standard drinks  . Drug use: No  . Sexual activity: Never    Partners: Male  Lifestyle  . Physical activity:    Days per week: Not on file    Minutes per session: Not on file  . Stress: Not on file  Relationships  . Social connections:    Talks on phone: Not on file    Gets together: Not on file  Attends religious service: Not on file    Active member of club or organization: Not on file    Attends meetings of clubs or organizations: Not on file    Relationship status: Not on file  . Intimate partner violence:    Fear of current or ex partner: Not on file    Emotionally abused: Not on file    Physically abused: Not on file    Forced sexual activity: Not on file  Other Topics Concern  . Not on file  Social History Narrative   Married for 37 years.    She has one child and one stepston.       FAMILY HISTORY: Family History  Problem Relation Age of Onset  . Arthritis Mother   . Hypertension Mother   . Heart disease Father   . Diabetes Father   . Breast cancer Sister 32       d. 1  . Hypertension Brother   . Leukemia Brother   . Coronary artery disease Brother     Review of Systems  Constitutional: Negative for appetite change, chills, fatigue, fever and unexpected weight change.  HENT:   Negative for hearing loss, lump/mass, nosebleeds, sore throat and trouble swallowing.   Eyes:  Negative for eye problems and icterus.  Respiratory: Negative for chest tightness, cough and shortness of breath.   Cardiovascular: Negative for chest pain, leg swelling and palpitations.  Gastrointestinal: Positive for diarrhea (see interval history) and nausea (see interval history). Negative for abdominal distention, abdominal pain, constipation and vomiting.  Endocrine: Negative for hot flashes.  Genitourinary: Negative for difficulty urinating.   Musculoskeletal: Negative for arthralgias.  Skin: Negative for itching and rash.  Neurological: Positive for numbness. Negative for dizziness, extremity weakness, headaches and light-headedness.  Hematological: Negative for adenopathy. Does not bruise/bleed easily.  Psychiatric/Behavioral: Negative for depression. The patient is not nervous/anxious.       PHYSICAL EXAMINATION  ECOG PERFORMANCE STATUS: 1 - Symptomatic but completely ambulatory  Vitals:   10/28/18 0902  BP: (!) 155/64  Pulse: 87  Resp: 18  Temp: 97.7 F (36.5 C)  SpO2: 95%    Physical Exam Constitutional:      General: She is not in acute distress.    Appearance: Normal appearance. She is not toxic-appearing.  HENT:     Head: Normocephalic and atraumatic.     Nose: No congestion.     Mouth/Throat:     Mouth: Mucous membranes are moist.     Pharynx: Oropharynx is clear. No oropharyngeal exudate.  Eyes:     General: No scleral icterus.    Pupils: Pupils are equal, round, and reactive to light.  Neck:     Musculoskeletal: Neck supple.  Cardiovascular:     Rate and Rhythm: Normal rate and regular rhythm.  Pulmonary:     Effort: Pulmonary effort is normal.     Breath sounds: Normal breath sounds.  Abdominal:     General: Abdomen is flat. Bowel sounds are normal. There is no distension.     Palpations: Abdomen is soft.     Tenderness: There is no abdominal tenderness.  Musculoskeletal:        General: No swelling.  Lymphadenopathy:     Cervical: No  cervical adenopathy.  Skin:    General: Skin is warm and dry.     Capillary Refill: Capillary refill takes less than 2 seconds.     Findings: No rash.  Neurological:     General: No focal deficit  present.     Mental Status: She is alert and oriented to person, place, and time.  Psychiatric:        Mood and Affect: Mood normal.        Behavior: Behavior normal.     LABORATORY DATA:  CBC    Component Value Date/Time   WBC 2.7 (L) 10/28/2018 0850   WBC 8.9 10/11/2018 0700   RBC 3.70 (L) 10/28/2018 0850   HGB 11.8 (L) 10/28/2018 0850   HGB 10.0 (L) 04/29/2013 0812   HCT 35.1 (L) 10/28/2018 0850   HCT 29.1 (L) 04/29/2013 0812   PLT 107 (L) 10/28/2018 0850   PLT 378 04/29/2013 0812   MCV 94.9 10/28/2018 0850   MCV 95 04/29/2013 0812   MCH 31.9 10/28/2018 0850   MCHC 33.6 10/28/2018 0850   RDW 13.9 10/28/2018 0850   RDW 15.5 (H) 04/29/2013 0812   LYMPHSABS 0.8 10/28/2018 0850   LYMPHSABS 1.4 04/29/2013 0812   MONOABS 0.7 10/28/2018 0850   MONOABS 0.7 04/29/2013 0812   EOSABS 0.1 10/28/2018 0850   EOSABS 0.4 04/29/2013 0812   BASOSABS 0.1 10/28/2018 0850   BASOSABS 0.1 04/29/2013 0812    CMP     Component Value Date/Time   NA 140 10/28/2018 0850   K 3.3 (L) 10/28/2018 0850   CL 106 10/28/2018 0850   CO2 25 10/28/2018 0850   GLUCOSE 125 (H) 10/28/2018 0850   BUN 6 (L) 10/28/2018 0850   CREATININE 0.82 10/28/2018 0850   CALCIUM 8.7 (L) 10/28/2018 0850   PROT 6.7 10/28/2018 0850   ALBUMIN 3.7 10/28/2018 0850   AST 16 10/28/2018 0850   ALT 17 10/28/2018 0850   ALKPHOS 78 10/28/2018 0850   BILITOT 0.8 10/28/2018 0850   GFRNONAA >60 10/28/2018 0850   GFRAA >60 10/28/2018 0850       PENDING LABS:   RADIOGRAPHIC STUDIES:  No results found.   PATHOLOGY:     ASSESSMENT and THERAPY PLAN:   Malignant neoplasm of upper-outer quadrant of left breast in female, estrogen receptor positive ([place]) With prior history of breast implants that were  ruptured/replaced, patient had cellulitis LIQ left breast for 1 week, and UOQ asymmetry 1.5 cm, MRI right breast biopsy benign, left breast numerous masses 7 cm skin thickening: 2 biopsies from left breast: Grade 2-3 IDC ER PR positive, HER-2 positive, Ki-67 50% Clinical stage: T4 N0 M0 stage IIIb  Treatment plan 1. Neoadjuvant chemotherapy with TCH Perjeta 6 cycles followed by Herceptin maintenance for 1 year 2. Followed by mastectomy with sentinel lymph node study 3. Followed by adjuvant radiation therapy ---------------------------------------------------------------  CT chest abdomen pelvis and bone scan: 09/28/2018: 3 mm lung nodule and 1 cm liver subcapsular lesion MRI liver on 2/18 shows benign cystic lesion. The lung nodule will need a CT in 12/2018. She had a bone lesion on the bone scan which is related to her knee replacement surgery and does not need any additional work-up. ------------------------------------------------------------------- Current treatment: Cycle 2 day 8 neoadjuvant TCH Perjeta Chemo toxicities: 1.Severe diarrhea: Perjeta discontinued, taking Questran and Lomotil, counseled on appropriate way to take Lomotil.  Will receive IV fluids today. 2.Fatigue: improved since blood transfusion 3.Leukopenia:  Taxotere and Carbo were dose reduced with cycle 2.  Her nadir ANC today is 0.9.   4.  Nausea and vomiting: Much improved, only experienced once, and resolved with anti emetics. 5.  Anemia: Received 2 units of PRBCs last Saturday, hemoglobin is 11.8 today. 6. Peripheral neuropathy: mild and  intermittent, will monitor for now 7. Hypokalemia: oral potassium ordered for during infusion 8. H/o diverticulitis: She knows to call us if she develops symptoms.  Labs have been reviewed Return to clinic  in 2 weeks for cycle 3   All questions were answered. The patient knows to call the clinic with any problems, questions or concerns. We can certainly see the patient  much sooner if necessary.  A total of (30) minutes of face-to-face time was spent with this patient with greater than 50% of that time in counseling and care-coordination.  This note was electronically signed. Scot Dock, NP 10/28/2018

## 2018-10-29 ENCOUNTER — Telehealth: Payer: Self-pay

## 2018-10-29 ENCOUNTER — Other Ambulatory Visit: Payer: Self-pay | Admitting: Family Medicine

## 2018-10-29 NOTE — Telephone Encounter (Signed)
Pt called to report that she started having diarrhea episode 5-6 times per day since yesterday. Pt taking questran 2 times daily and lomotil. Pt would like to know if she needs to come in tomorrow for IVF again. She had IVF infusion yesterday. Pt drinking and eating well. Advised pt to take questran up to 3 x daily per direction. Pt verbalized understanding. Pt to call tomorrow and notify office if she needs to get IVF's on Saturday.

## 2018-10-30 DIAGNOSIS — L668 Other cicatricial alopecia: Secondary | ICD-10-CM | POA: Diagnosis not present

## 2018-10-30 DIAGNOSIS — L249 Irritant contact dermatitis, unspecified cause: Secondary | ICD-10-CM | POA: Diagnosis not present

## 2018-11-02 ENCOUNTER — Telehealth: Payer: Self-pay

## 2018-11-02 NOTE — Telephone Encounter (Signed)
Nurse spoke with patient.  Patient has had no loose stools today, however had 3 yesterday.  Patient tolerating fluids well, reports drinking 6-8 8oz glasses daily.    Pt asking if she still needs to take lomotil and imodium.  Nurse encouraged use if she starts with loose stools again, continue with use of questran.  Voiced understanding.  Patient will call with any changes.

## 2018-11-10 NOTE — Progress Notes (Signed)
 Patient Care Team: Bedsole, Amy E, MD as PCP - General (Family Medicine) Porfilio, William, MD as Referring Physician (Ophthalmology) Isenstein, Arin L, MD as Consulting Physician (Dermatology) Kernodle, Harold, MD as Consulting Physician (Orthopedic Surgery) Woodham, Charles, MD as Consulting Physician (General Surgery) Makhlouf, Mary, MD as Consulting Physician (Dentistry) Hoxworth, Benjamin, MD as Consulting Physician (General Surgery) Gudena, Vinay, MD as Consulting Physician (Hematology and Oncology) Squire, Sarah, MD as Attending Physician (Radiation Oncology)  DIAGNOSIS:    ICD-10-CM   1. Malignant neoplasm of upper-outer quadrant of left breast in female, estrogen receptor positive (HCC) C50.412    Z17.0     SUMMARY OF ONCOLOGIC HISTORY:   Malignant neoplasm of upper-outer quadrant of left breast in female, estrogen receptor positive (HCC)   09/14/2018 Initial Diagnosis    With prior history of breast implants that were ruptured/replaced, patient had cellulitis LIQ left breast for 1 week, and UOQ asymmetry 1.5 cm, MRI right breast biopsy benign, left breast numerous masses 7 cm skin thickening: 2 biopsies from left breast: Grade 2-3 IDC ER PR positive, HER-2 positive, Ki-67 50%    09/16/2018 Cancer Staging    Staging form: Breast, AJCC 8th Edition - Clinical stage from 09/16/2018: Stage IIIB (cT4, cN0, cM0, G3, ER+, PR+, HER2+) - Signed by Gudena, Vinay, MD on 09/16/2018    09/29/2018 -  Neo-Adjuvant Chemotherapy    TCHP q 3 weeks     CHIEF COMPLIANT: Cycle 3 TCH  INTERVAL HISTORY: Katie [name] is a 73 y.o. with above-mentioned history of left breast cancer currently receiving neoadjuvant chemotherapy with TCHP. She presents to the clinic today with her husband. She tolerated her last treatment well but had a few episodes of diarrhea that resolved with Questran. She reports a mild increase in appetite despite a loss of taste. She notes occasional numbness and tingling in  her fingers and toes, some of which was present before treatment. She has been hydrating well. Her labs from today show: WBC 4.4, Hg 12.0, platelets 158, ANC 2.7, creatinine 0.8, potassium 3.6, albumin 4.0. She reviewed her medication list with me.   REVIEW OF SYSTEMS:   Constitutional: Denies fevers, chills or abnormal weight loss (+) loss of taste Eyes: Denies blurriness of vision Ears, nose, mouth, throat, and face: Denies mucositis or sore throat Respiratory: Denies cough, dyspnea or wheezes Cardiovascular: Denies palpitation, chest discomfort Gastrointestinal: Denies nausea, heartburn (+) diarrhea Skin: Denies abnormal skin rashes Lymphatics: Denies new lymphadenopathy or easy bruising Neurological: Denies new weaknesses (+) numbness, tingling in fingers and toes Behavioral/Psych: Mood is stable, no new changes  Extremities: No lower extremity edema Breast: denies any pain or lumps or nodules in either breasts All other systems were reviewed with the patient and are negative.  I have reviewed the past medical history, past surgical history, social history and family history with the patient and they are unchanged from previous note.  ALLERGIES:  is allergic to erythromycin; ciprofloxacin; daypro [oxaprozin]; enalapril maleate; nsaids; and vioxx [rofecoxib].  MEDICATIONS:  Current Outpatient Medications  Medication Sig Dispense Refill  . acetaminophen (TYLENOL) 325 MG tablet Take 650 mg by mouth every 6 (six) hours as needed for mild pain or headache.    . amLODipine (NORVASC) 5 MG tablet Take 1 tablet (5 mg total) by mouth daily. 30 tablet 11  . atorvastatin (LIPITOR) 10 MG tablet TAKE 1 TABLET(10 MG) BY MOUTH DAILY (Patient taking differently: Take 10 mg by mouth daily at 6 PM. ) 90 tablet 0  . Azelastine   HCl 137 MCG/SPRAY SOLN instill 1 spray into each nostril twice a day for 1 week then if needed 30 mL 5  . cholecalciferol (VITAMIN D) 400 units TABS tablet Take 400 Units by mouth  2 (two) times daily.    . cholestyramine (QUESTRAN) 4 g packet Take 1 packet (4 g total) by mouth 3 (three) times daily with meals. 60 each 12  . clobetasol (TEMOVATE) 0.05 % external solution Apply 1 application topically 2 (two) times daily as needed (scalp). Reported on 08/16/2015    . diphenoxylate-atropine (LOMOTIL) 2.5-0.025 MG tablet     . fluticasone (FLONASE) 50 MCG/ACT nasal spray instill 2 sprays into each nostril once daily (Patient taking differently: Place 2 sprays into both nostrils daily as needed for allergies. ) 16 g 5  . glucose blood test strip USE TO CHECK BLOOD SUGAR DAILY 50 each 12  . lidocaine-prilocaine (EMLA) cream Apply to affected area once (Patient taking differently: Apply 1 application topically as needed (port access). ) 30 g 3  . loperamide (IMODIUM) 2 MG capsule Take 2 mg by mouth as needed for diarrhea or loose stools.    . loratadine (CLARITIN) 10 MG tablet Take 10 mg by mouth daily.    . LORazepam (ATIVAN) 0.5 MG tablet Take 1 tablet (0.5 mg total) by mouth at bedtime as needed for sleep. 30 tablet 0  . metFORMIN (GLUCOPHAGE-XR) 500 MG 24 hr tablet Take 1 tablet (500 mg total) by mouth daily with breakfast. 90 tablet 3  . metroNIDAZOLE (METROCREAM) 0.75 % cream Apply 1 application topically 2 (two) times daily as needed (rosacea).     . Multiple Vitamin (MULTIVITAMIN WITH MINERALS) TABS tablet Take 1 tablet by mouth daily.    . Olopatadine HCl 0.2 % SOLN 1 drop per eye daily 2.5 mL 0  . ondansetron (ZOFRAN) 8 MG tablet Take 1 tablet (8 mg total) by mouth 2 (two) times daily as needed (Nausea or vomiting). Begin 4 days after chemotherapy. 30 tablet 1  . prochlorperazine (COMPAZINE) 10 MG tablet Take 1 tablet (10 mg total) by mouth every 6 (six) hours as needed (Nausea or vomiting). 30 tablet 1  . triamcinolone cream (KENALOG) 0.1 % Apply 1 application topically 2 (two) times daily. (Patient taking differently: Apply 1 application topically 2 (two) times daily as  needed (skin rashes). ) 15 g 0  . valACYclovir (VALTREX) 500 MG tablet Take 1 tablet (500 mg total) by mouth 2 (two) times daily. (Patient taking differently: Take 500 mg by mouth as needed (flare up). ) 6 tablet 2   No current facility-administered medications for this visit.     PHYSICAL EXAMINATION: ECOG PERFORMANCE STATUS: 1 - Symptomatic but completely ambulatory  Vitals:   11/11/18 0902  BP: (!) 149/57  Pulse: 83  Resp: 17  Temp: 98.4 F (36.9 C)  SpO2: 97%   Filed Weights   11/11/18 0902  Weight: 159 lb 12.8 oz (72.5 kg)    GENERAL: alert, no distress and comfortable SKIN: skin color, texture, turgor are normal, no rashes or significant lesions EYES: normal, Conjunctiva are pink and non-injected, sclera clear OROPHARYNX: no exudate, no erythema and lips, buccal mucosa, and tongue normal  NECK: supple, thyroid normal size, non-tender, without nodularity LYMPH: no palpable lymphadenopathy in the cervical, axillary or inguinal LUNGS: clear to auscultation and percussion with normal breathing effort HEART: regular rate & rhythm and no murmurs and no lower extremity edema ABDOMEN: abdomen soft, non-tender and normal bowel sounds MUSCULOSKELETAL: no cyanosis   of digits and no clubbing  NEURO: alert & oriented x 3 with fluent speech, no focal motor/sensory deficits EXTREMITIES: No lower extremity edema  LABORATORY DATA:  I have reviewed the data as listed CMP Latest Ref Rng & Units 11/11/2018 10/28/2018 10/21/2018  Glucose 70 - 99 mg/dL 137(H) 125(H) 99  BUN 8 - 23 mg/dL 15 6(L) 6(L)  Creatinine 0.44 - 1.00 mg/dL 0.80 0.82 0.79  Sodium 135 - 145 mmol/L 141 140 143  Potassium 3.5 - 5.1 mmol/L 3.6 3.3(L) 3.4(L)  Chloride 98 - 111 mmol/L 107 106 112(H)  CO2 22 - 32 mmol/L _0 Calcium 8.9 - 10.3 mg/dL 9.4 8.7(L) 8.7(L)  Total Protein 6.5 - 8.1 g/dL 7.0 6.7 6.3(L)  Total Bilirubin 0.3 - 1.2 mg/dL 0.6 0.8 0.4  Alkaline Phos 38 - 126 U/L 79 78 73  AST 15 - 41 U/L _1 ALT 0 - 44 U/L _2 Lab Results  Component Value Date   WBC 4.4 11/11/2018   HGB 12.0 11/11/2018   HCT 35.5 (L) 11/11/2018   MCV 96.5 11/11/2018   PLT 158 11/11/2018   NEUTROABS 2.7 11/11/2018    ASSESSMENT & PLAN:  Malignant neoplasm of upper-outer quadrant of left breast in female, estrogen receptor positive ([place]) With prior history of breast implants that were ruptured/replaced, patient had cellulitis LIQ left breast for 1 week, and UOQ asymmetry 1.5 cm, MRI right breast biopsy benign, left breast numerous masses 7 cm skin thickening: 2 biopsies from left breast: Grade 2-3 IDC ER PR positive, HER-2 positive, Ki-67 50% Clinical stage: T4 N0 M0 stage IIIb  Treatment plan 1. Neoadjuvant chemotherapy with TCH Perjeta 6 cycles followed by Herceptin maintenance for 1 year 2. Followed by mastectomy with sentinel lymph node study 3. Followed by adjuvant radiation therapy ---------------------------------------------------------------  CT chest abdomen pelvis and bone scan: 09/28/2018: 3 mm lung nodule and 1 cm liver subcapsular lesion MRI liver on 2/18 shows benign cystic lesion. The lung nodule will need a CT in 12/2018. She had a bone lesion on the bone scan which is related to her knee replacement surgery and does not need any additional work-up. ------------------------------------------------------------------- Current treatment: Cycle 3 day1neoadjuvant TCH (Perjeta discontinued after cycle 1) Chemo toxicities: 1.Severe diarrhea: Occasional diarrhea which is well controlled with Questran 2.Fatigue: improved since blood transfusion 3.Leukopenia: Taxotere and Carbo were dose reduced with cycle 2.      4.Nausea and vomiting: Much improved, only experienced once, and resolved with anti emetics. 5.  Anemia: Received 2 units of PRBCs   6. Peripheral neuropathy: mild and intermittent, will monitor for now 7. Hypokalemia: Monitoring closely  8. H/o diverticulitis:  She knows to call us if she develops symptoms. We discussed at length about precautions for coronavirus. Labs have been reviewed Return to clinic in  3 weeks for cycle 4 We would like to minimize her contact with the hospital and therefore we will try to combine her lab appointment and treatment appointments on the same day.    No orders of the defined types were placed in this encounter.  The patient has a good understanding of the overall plan. she agrees with it. she will call with any problems that may develop before the next visit here.  [name], MD 11/11/2018  Julious Oka Dorshimer am acting as scribe for Dr. [name].  I have reviewed the above documentation for accuracy and completeness, and I agree with the above.

## 2018-11-11 ENCOUNTER — Inpatient Hospital Stay: Payer: Medicare Other

## 2018-11-11 ENCOUNTER — Other Ambulatory Visit: Payer: Self-pay

## 2018-11-11 ENCOUNTER — Telehealth: Payer: Self-pay | Admitting: Hematology and Oncology

## 2018-11-11 ENCOUNTER — Inpatient Hospital Stay (HOSPITAL_BASED_OUTPATIENT_CLINIC_OR_DEPARTMENT_OTHER): Payer: Medicare Other | Admitting: Hematology and Oncology

## 2018-11-11 DIAGNOSIS — R197 Diarrhea, unspecified: Secondary | ICD-10-CM

## 2018-11-11 DIAGNOSIS — G629 Polyneuropathy, unspecified: Secondary | ICD-10-CM | POA: Diagnosis not present

## 2018-11-11 DIAGNOSIS — Z95828 Presence of other vascular implants and grafts: Secondary | ICD-10-CM

## 2018-11-11 DIAGNOSIS — Z170 Estrogen receptor positive status [ER+]: Secondary | ICD-10-CM

## 2018-11-11 DIAGNOSIS — Z5112 Encounter for antineoplastic immunotherapy: Secondary | ICD-10-CM | POA: Diagnosis not present

## 2018-11-11 DIAGNOSIS — C50412 Malignant neoplasm of upper-outer quadrant of left female breast: Secondary | ICD-10-CM

## 2018-11-11 DIAGNOSIS — D649 Anemia, unspecified: Secondary | ICD-10-CM | POA: Diagnosis not present

## 2018-11-11 DIAGNOSIS — Z5189 Encounter for other specified aftercare: Secondary | ICD-10-CM | POA: Diagnosis not present

## 2018-11-11 DIAGNOSIS — E876 Hypokalemia: Secondary | ICD-10-CM

## 2018-11-11 DIAGNOSIS — Z5111 Encounter for antineoplastic chemotherapy: Secondary | ICD-10-CM | POA: Diagnosis not present

## 2018-11-11 LAB — CBC WITH DIFFERENTIAL (CANCER CENTER ONLY)
Abs Immature Granulocytes: 0.01 10*3/uL (ref 0.00–0.07)
Basophils Absolute: 0 10*3/uL (ref 0.0–0.1)
Basophils Relative: 1 %
EOS ABS: 0 10*3/uL (ref 0.0–0.5)
Eosinophils Relative: 0 %
HCT: 35.5 % — ABNORMAL LOW (ref 36.0–46.0)
Hemoglobin: 12 g/dL (ref 12.0–15.0)
IMMATURE GRANULOCYTES: 0 %
Lymphocytes Relative: 25 %
Lymphs Abs: 1.1 10*3/uL (ref 0.7–4.0)
MCH: 32.6 pg (ref 26.0–34.0)
MCHC: 33.8 g/dL (ref 30.0–36.0)
MCV: 96.5 fL (ref 80.0–100.0)
Monocytes Absolute: 0.6 10*3/uL (ref 0.1–1.0)
Monocytes Relative: 13 %
NEUTROS PCT: 61 %
Neutro Abs: 2.7 10*3/uL (ref 1.7–7.7)
Platelet Count: 158 10*3/uL (ref 150–400)
RBC: 3.68 MIL/uL — ABNORMAL LOW (ref 3.87–5.11)
RDW: 14.7 % (ref 11.5–15.5)
WBC Count: 4.4 10*3/uL (ref 4.0–10.5)
nRBC: 0 % (ref 0.0–0.2)

## 2018-11-11 LAB — CMP (CANCER CENTER ONLY)
ALT: 25 U/L (ref 0–44)
AST: 18 U/L (ref 15–41)
Albumin: 4 g/dL (ref 3.5–5.0)
Alkaline Phosphatase: 79 U/L (ref 38–126)
Anion gap: 12 (ref 5–15)
BUN: 15 mg/dL (ref 8–23)
CO2: 22 mmol/L (ref 22–32)
Calcium: 9.4 mg/dL (ref 8.9–10.3)
Chloride: 107 mmol/L (ref 98–111)
Creatinine: 0.8 mg/dL (ref 0.44–1.00)
GFR, Est AFR Am: >60 mL/min (ref >60)
GFR, Estimated: >60 mL/min (ref >60)
Glucose, Bld: 137 mg/dL — ABNORMAL HIGH (ref 70–99)
POTASSIUM: 3.6 mmol/L (ref 3.5–5.1)
Sodium: 141 mmol/L (ref 135–145)
Total Bilirubin: 0.6 mg/dL (ref 0.3–1.2)
Total Protein: 7 g/dL (ref 6.5–8.1)

## 2018-11-11 MED ORDER — SODIUM CHLORIDE 0.9 % IV SOLN
425.0000 mg | Freq: Once | INTRAVENOUS | Status: AC
  Administered 2018-11-11: 430 mg via INTRAVENOUS
  Filled 2018-11-11: qty 43

## 2018-11-11 MED ORDER — SODIUM CHLORIDE 0.9% FLUSH
10.0000 mL | INTRAVENOUS | Status: DC | PRN
  Administered 2018-11-11: 10 mL
  Filled 2018-11-11: qty 10

## 2018-11-11 MED ORDER — DIPHENHYDRAMINE HCL 25 MG PO CAPS
ORAL_CAPSULE | ORAL | Status: AC
  Filled 2018-11-11: qty 2

## 2018-11-11 MED ORDER — SODIUM CHLORIDE 0.9 % IV SOLN
Freq: Once | INTRAVENOUS | Status: AC
  Administered 2018-11-11: 11:00:00 via INTRAVENOUS
  Filled 2018-11-11: qty 5

## 2018-11-11 MED ORDER — SODIUM CHLORIDE 0.9 % IV SOLN
60.0000 mg/m2 | Freq: Once | INTRAVENOUS | Status: AC
  Administered 2018-11-11: 110 mg via INTRAVENOUS
  Filled 2018-11-11: qty 11

## 2018-11-11 MED ORDER — TRASTUZUMAB CHEMO 150 MG IV SOLR
450.0000 mg | Freq: Once | INTRAVENOUS | Status: AC
  Administered 2018-11-11: 450 mg via INTRAVENOUS
  Filled 2018-11-11: qty 21.43

## 2018-11-11 MED ORDER — HEPARIN SOD (PORK) LOCK FLUSH 100 UNIT/ML IV SOLN
500.0000 [IU] | Freq: Once | INTRAVENOUS | Status: AC | PRN
  Administered 2018-11-11: 500 [IU]
  Filled 2018-11-11: qty 5

## 2018-11-11 MED ORDER — SODIUM CHLORIDE 0.9 % IV SOLN
Freq: Once | INTRAVENOUS | Status: AC
  Administered 2018-11-11: 10:00:00 via INTRAVENOUS
  Filled 2018-11-11: qty 250

## 2018-11-11 MED ORDER — PALONOSETRON HCL INJECTION 0.25 MG/5ML
INTRAVENOUS | Status: AC
  Filled 2018-11-11: qty 5

## 2018-11-11 MED ORDER — ACETAMINOPHEN 325 MG PO TABS
650.0000 mg | ORAL_TABLET | Freq: Once | ORAL | Status: AC
  Administered 2018-11-11: 650 mg via ORAL

## 2018-11-11 MED ORDER — ACETAMINOPHEN 325 MG PO TABS
ORAL_TABLET | ORAL | Status: AC
  Filled 2018-11-11: qty 2

## 2018-11-11 MED ORDER — DIPHENHYDRAMINE HCL 25 MG PO CAPS
50.0000 mg | ORAL_CAPSULE | Freq: Once | ORAL | Status: AC
  Administered 2018-11-11: 50 mg via ORAL

## 2018-11-11 MED ORDER — SODIUM CHLORIDE 0.9% FLUSH
10.0000 mL | Freq: Once | INTRAVENOUS | Status: AC
  Administered 2018-11-11: 10 mL
  Filled 2018-11-11: qty 10

## 2018-11-11 MED ORDER — PALONOSETRON HCL INJECTION 0.25 MG/5ML
0.2500 mg | Freq: Once | INTRAVENOUS | Status: AC
  Administered 2018-11-11: 0.25 mg via INTRAVENOUS

## 2018-11-11 NOTE — Patient Instructions (Signed)
[facility] Discharge Instructions for Patients Receiving Chemotherapy  Today you received the following chemotherapy agents:  Herceptin, Taxotere, Carboplatin  To help prevent nausea and vomiting after your treatment, we encourage you to take your nausea medication as prescribed.   If you develop nausea and vomiting that is not controlled by your nausea medication, call the clinic.   BELOW ARE SYMPTOMS THAT SHOULD BE REPORTED IMMEDIATELY:  *FEVER GREATER THAN 100.5 F  *CHILLS WITH OR WITHOUT FEVER  NAUSEA AND VOMITING THAT IS NOT CONTROLLED WITH YOUR NAUSEA MEDICATION  *UNUSUAL SHORTNESS OF BREATH  *UNUSUAL BRUISING OR BLEEDING  TENDERNESS IN MOUTH AND THROAT WITH OR WITHOUT PRESENCE OF ULCERS  *URINARY PROBLEMS  *BOWEL PROBLEMS  UNUSUAL RASH Items with * indicate a potential emergency and should be followed up as soon as possible.  Feel free to call the clinic should you have any questions or concerns. The clinic phone number is [phone].  Please show the CHEMO ALERT CARD at check-in to the Emergency Department and triage nurse.   

## 2018-11-11 NOTE — Telephone Encounter (Signed)
Gave avs and calendar ° °

## 2018-11-11 NOTE — Assessment & Plan Note (Signed)
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 Neoadjuvant chemotherapy with TCH Perjeta 6 cycles followed by Herceptin maintenance for 1 year 2. Followed by mastectomy with sentinel lymph node study 3. Followed by adjuvant radiation therapy ---------------------------------------------------------------  CT chest abdomen pelvis and bone scan: 09/28/2018: 3 mm lung nodule and 1 cm liver subcapsular lesion MRI liver on 2/18 shows benign cystic lesion. The lung nodule will need a CT in 12/2018. She had a bone lesion on the bone scan which is related to her knee replacement surgery and does not need any additional work-up. ------------------------------------------------------------------- Current treatment: Cycle 3 day1neoadjuvant TCH (Perjeta discontinued after cycle 1) Chemo toxicities: 1.Severe diarrhea: Improved with Perjeta discontinuation 2.Fatigue: improved since blood transfusion 3.Leukopenia: Taxotere and Carbo were dose reduced with cycle 2.      4.Nausea and vomiting: Much improved, only experienced once, and resolved with anti emetics. 5.  Anemia: Received 2 units of PRBCs   6. Peripheral neuropathy: mild and intermittent, will monitor for now 7. Hypokalemia: Monitoring closely  8. H/o diverticulitis: She knows to call us if she develops symptoms.  Labs have been reviewed Return to clinic in 3 weeks for cycle 4

## 2018-11-12 ENCOUNTER — Other Ambulatory Visit: Payer: Self-pay

## 2018-11-12 DIAGNOSIS — Z170 Estrogen receptor positive status [ER+]: Principal | ICD-10-CM

## 2018-11-12 DIAGNOSIS — C50412 Malignant neoplasm of upper-outer quadrant of left female breast: Secondary | ICD-10-CM

## 2018-11-12 DIAGNOSIS — N3000 Acute cystitis without hematuria: Secondary | ICD-10-CM

## 2018-11-13 ENCOUNTER — Inpatient Hospital Stay: Payer: Medicare Other

## 2018-11-13 ENCOUNTER — Other Ambulatory Visit: Payer: Self-pay | Admitting: Family Medicine

## 2018-11-13 ENCOUNTER — Other Ambulatory Visit: Payer: Self-pay | Admitting: Hematology and Oncology

## 2018-11-13 ENCOUNTER — Other Ambulatory Visit: Payer: Self-pay

## 2018-11-13 DIAGNOSIS — C50412 Malignant neoplasm of upper-outer quadrant of left female breast: Secondary | ICD-10-CM

## 2018-11-13 DIAGNOSIS — Z170 Estrogen receptor positive status [ER+]: Principal | ICD-10-CM

## 2018-11-13 DIAGNOSIS — E876 Hypokalemia: Secondary | ICD-10-CM | POA: Diagnosis not present

## 2018-11-13 DIAGNOSIS — Z5111 Encounter for antineoplastic chemotherapy: Secondary | ICD-10-CM | POA: Diagnosis not present

## 2018-11-13 DIAGNOSIS — Z5112 Encounter for antineoplastic immunotherapy: Secondary | ICD-10-CM | POA: Diagnosis not present

## 2018-11-13 DIAGNOSIS — N3000 Acute cystitis without hematuria: Secondary | ICD-10-CM

## 2018-11-13 DIAGNOSIS — R197 Diarrhea, unspecified: Secondary | ICD-10-CM | POA: Diagnosis not present

## 2018-11-13 DIAGNOSIS — Z5189 Encounter for other specified aftercare: Secondary | ICD-10-CM | POA: Diagnosis not present

## 2018-11-13 LAB — CBC WITH DIFFERENTIAL (CANCER CENTER ONLY)
Abs Immature Granulocytes: 0.05 10*3/uL (ref 0.00–0.07)
BASOS PCT: 0 %
Basophils Absolute: 0 10*3/uL (ref 0.0–0.1)
Eosinophils Absolute: 0.1 10*3/uL (ref 0.0–0.5)
Eosinophils Relative: 2 %
HCT: 34.2 % — ABNORMAL LOW (ref 36.0–46.0)
Hemoglobin: 11.6 g/dL — ABNORMAL LOW (ref 12.0–15.0)
Immature Granulocytes: 1 %
Lymphocytes Relative: 23 %
Lymphs Abs: 1.3 10*3/uL (ref 0.7–4.0)
MCH: 32.2 pg (ref 26.0–34.0)
MCHC: 33.9 g/dL (ref 30.0–36.0)
MCV: 95 fL (ref 80.0–100.0)
Monocytes Absolute: 0.3 10*3/uL (ref 0.1–1.0)
Monocytes Relative: 5 %
Neutro Abs: 4 10*3/uL (ref 1.7–7.7)
Neutrophils Relative %: 69 %
PLATELETS: 162 10*3/uL (ref 150–400)
RBC: 3.6 MIL/uL — ABNORMAL LOW (ref 3.87–5.11)
RDW: 15.1 % (ref 11.5–15.5)
WBC Count: 5.8 10*3/uL (ref 4.0–10.5)
nRBC: 0 % (ref 0.0–0.2)

## 2018-11-13 LAB — URINALYSIS, COMPLETE (UACMP) WITH MICROSCOPIC
Bacteria, UA: NONE SEEN
Bilirubin Urine: NEGATIVE
Glucose, UA: NEGATIVE mg/dL
Hgb urine dipstick: NEGATIVE
Ketones, ur: NEGATIVE mg/dL
Nitrite: NEGATIVE
Protein, ur: NEGATIVE mg/dL
Specific Gravity, Urine: 1.016 (ref 1.005–1.030)
pH: 5 (ref 5.0–8.0)

## 2018-11-13 LAB — CMP (CANCER CENTER ONLY)
ALT: 27 U/L (ref 0–44)
AST: 27 U/L (ref 15–41)
Albumin: 3.9 g/dL (ref 3.5–5.0)
Alkaline Phosphatase: 71 U/L (ref 38–126)
Anion gap: 11 (ref 5–15)
BUN: 23 mg/dL (ref 8–23)
CHLORIDE: 107 mmol/L (ref 98–111)
CO2: 22 mmol/L (ref 22–32)
Calcium: 8.4 mg/dL — ABNORMAL LOW (ref 8.9–10.3)
Creatinine: 0.79 mg/dL (ref 0.44–1.00)
GFR, Est AFR Am: >60 mL/min (ref >60)
GFR, Estimated: >60 mL/min (ref >60)
GLUCOSE: 133 mg/dL — AB (ref 70–99)
Potassium: 3.6 mmol/L (ref 3.5–5.1)
SODIUM: 140 mmol/L (ref 135–145)
Total Bilirubin: 0.5 mg/dL (ref 0.3–1.2)
Total Protein: 6.7 g/dL (ref 6.5–8.1)

## 2018-11-13 MED ORDER — PEGFILGRASTIM-CBQV 6 MG/0.6ML ~~LOC~~ SOSY
PREFILLED_SYRINGE | SUBCUTANEOUS | Status: AC
  Filled 2018-11-13: qty 0.6

## 2018-11-13 MED ORDER — PEGFILGRASTIM-CBQV 6 MG/0.6ML ~~LOC~~ SOSY
6.0000 mg | PREFILLED_SYRINGE | Freq: Once | SUBCUTANEOUS | Status: AC
  Administered 2018-11-13: 6 mg via SUBCUTANEOUS

## 2018-11-13 NOTE — Patient Instructions (Signed)
Pegfilgrastim injection  What is this medicine?  PEGFILGRASTIM (PEG fil gra stim) is a long-acting granulocyte colony-stimulating factor that stimulates the growth of neutrophils, a type of white blood cell important in the body's fight against infection. It is used to reduce the incidence of fever and infection in patients with certain types of cancer who are receiving chemotherapy that affects the bone marrow, and to increase survival after being exposed to high doses of radiation.  This medicine may be used for other purposes; ask your health care provider or pharmacist if you have questions.  COMMON BRAND NAME(S): Fulphila, Neulasta, UDENYCA  What should I tell my health care provider before I take this medicine?  They need to know if you have any of these conditions:  -kidney disease  -latex allergy  -ongoing radiation therapy  -sickle cell disease  -skin reactions to acrylic adhesives (On-Body Injector only)  -an unusual or allergic reaction to pegfilgrastim, filgrastim, other medicines, foods, dyes, or preservatives  -pregnant or trying to get pregnant  -breast-feeding  How should I use this medicine?  This medicine is for injection under the skin. If you get this medicine at home, you will be taught how to prepare and give the pre-filled syringe or how to use the On-body Injector. Refer to the patient Instructions for Use for detailed instructions. Use exactly as directed. Tell your healthcare provider immediately if you suspect that the On-body Injector may not have performed as intended or if you suspect the use of the On-body Injector resulted in a missed or partial dose.  It is important that you put your used needles and syringes in a special sharps container. Do not put them in a trash can. If you do not have a sharps container, call your pharmacist or healthcare provider to get one.  Talk to your pediatrician regarding the use of this medicine in children. While this drug may be prescribed for  selected conditions, precautions do apply.  Overdosage: If you think you have taken too much of this medicine contact a poison control center or emergency room at once.  NOTE: This medicine is only for you. Do not share this medicine with others.  What if I miss a dose?  It is important not to miss your dose. Call your doctor or health care professional if you miss your dose. If you miss a dose due to an On-body Injector failure or leakage, a new dose should be administered as soon as possible using a single prefilled syringe for manual use.  What may interact with this medicine?  Interactions have not been studied.  Give your health care provider a list of all the medicines, herbs, non-prescription drugs, or dietary supplements you use. Also tell them if you smoke, drink alcohol, or use illegal drugs. Some items may interact with your medicine.  This list may not describe all possible interactions. Give your health care provider a list of all the medicines, herbs, non-prescription drugs, or dietary supplements you use. Also tell them if you smoke, drink alcohol, or use illegal drugs. Some items may interact with your medicine.  What should I watch for while using this medicine?  You may need blood work done while you are taking this medicine.  If you are going to need a MRI, CT scan, or other procedure, tell your doctor that you are using this medicine (On-Body Injector only).  What side effects may I notice from receiving this medicine?  Side effects that you should report to   your doctor or health care professional as soon as possible:  -allergic reactions like skin rash, itching or hives, swelling of the face, lips, or tongue  -back pain  -dizziness  -fever  -pain, redness, or irritation at site where injected  -pinpoint red spots on the skin  -red or dark-brown urine  -shortness of breath or breathing problems  -stomach or side pain, or pain at the shoulder  -swelling  -tiredness  -trouble passing urine or  change in the amount of urine  Side effects that usually do not require medical attention (report to your doctor or health care professional if they continue or are bothersome):  -bone pain  -muscle pain  This list may not describe all possible side effects. Call your doctor for medical advice about side effects. You may report side effects to FDA at 1-800-FDA-1088.  Where should I keep my medicine?  Keep out of the reach of children.  If you are using this medicine at home, you will be instructed on how to store it. Throw away any unused medicine after the expiration date on the label.  NOTE: This sheet is a summary. It may not cover all possible information. If you have questions about this medicine, talk to your doctor, pharmacist, or health care provider.   2019 Elsevier/Gold Standard (2017-11-17 16:57:08)

## 2018-11-14 LAB — URINE CULTURE: CULTURE: NO GROWTH

## 2018-11-16 ENCOUNTER — Telehealth: Payer: Self-pay | Admitting: *Deleted

## 2018-11-16 NOTE — Telephone Encounter (Signed)
Returned phone call to pt wanting to know what her urine and blood test results showed from 11/13/2018.  Reviewed labs with pt and told her that her urine sample and urine culture were both negative for any bacteria.  Also reviewed pt CBC and BMET with with her.  Pt stated she had some burning with urination and was happy to find out that it is not a urinary tract infections.  Pt states she had diarrhea last week and that with the increase in using the bathroom and toilet paper, it may have irritated her.  Instructed pt to call us if she experiences any further symptoms, pt states understanding.

## 2018-11-18 ENCOUNTER — Other Ambulatory Visit: Payer: Medicare Other

## 2018-11-18 ENCOUNTER — Ambulatory Visit: Payer: Medicare Other | Admitting: Adult Health

## 2018-11-18 ENCOUNTER — Ambulatory Visit: Payer: Medicare Other

## 2018-11-19 ENCOUNTER — Telehealth: Payer: Self-pay | Admitting: *Deleted

## 2018-11-19 ENCOUNTER — Inpatient Hospital Stay (HOSPITAL_BASED_OUTPATIENT_CLINIC_OR_DEPARTMENT_OTHER): Payer: Medicare Other | Admitting: Medical

## 2018-11-19 ENCOUNTER — Inpatient Hospital Stay: Payer: Medicare Other

## 2018-11-19 ENCOUNTER — Other Ambulatory Visit: Payer: Self-pay

## 2018-11-19 ENCOUNTER — Other Ambulatory Visit: Payer: Self-pay | Admitting: Medical

## 2018-11-19 DIAGNOSIS — R197 Diarrhea, unspecified: Secondary | ICD-10-CM | POA: Diagnosis not present

## 2018-11-19 DIAGNOSIS — Z170 Estrogen receptor positive status [ER+]: Secondary | ICD-10-CM

## 2018-11-19 DIAGNOSIS — C50412 Malignant neoplasm of upper-outer quadrant of left female breast: Secondary | ICD-10-CM

## 2018-11-19 DIAGNOSIS — Z95828 Presence of other vascular implants and grafts: Secondary | ICD-10-CM

## 2018-11-19 DIAGNOSIS — R112 Nausea with vomiting, unspecified: Secondary | ICD-10-CM

## 2018-11-19 DIAGNOSIS — Z5111 Encounter for antineoplastic chemotherapy: Secondary | ICD-10-CM | POA: Diagnosis not present

## 2018-11-19 DIAGNOSIS — Z5112 Encounter for antineoplastic immunotherapy: Secondary | ICD-10-CM | POA: Diagnosis not present

## 2018-11-19 DIAGNOSIS — E876 Hypokalemia: Secondary | ICD-10-CM | POA: Diagnosis not present

## 2018-11-19 DIAGNOSIS — Z5189 Encounter for other specified aftercare: Secondary | ICD-10-CM | POA: Diagnosis not present

## 2018-11-19 LAB — CMP (CANCER CENTER ONLY)
ALT: 15 U/L (ref 0–44)
AST: 14 U/L — ABNORMAL LOW (ref 15–41)
Albumin: 4 g/dL (ref 3.5–5.0)
Alkaline Phosphatase: 96 U/L (ref 38–126)
Anion gap: 10 (ref 5–15)
BUN: 9 mg/dL (ref 8–23)
CO2: 23 mmol/L (ref 22–32)
CREATININE: 0.85 mg/dL (ref 0.44–1.00)
Calcium: 9.3 mg/dL (ref 8.9–10.3)
Chloride: 104 mmol/L (ref 98–111)
GFR, Est AFR Am: >60 mL/min (ref >60)
GFR, Estimated: >60 mL/min (ref >60)
Glucose, Bld: 151 mg/dL — ABNORMAL HIGH (ref 70–99)
Potassium: 4 mmol/L (ref 3.5–5.1)
Sodium: 137 mmol/L (ref 135–145)
Total Bilirubin: 0.6 mg/dL (ref 0.3–1.2)
Total Protein: 7 g/dL (ref 6.5–8.1)

## 2018-11-19 LAB — CBC WITH DIFFERENTIAL (CANCER CENTER ONLY)
Abs Immature Granulocytes: 0.68 10*3/uL — ABNORMAL HIGH (ref 0.00–0.07)
Basophils Absolute: 0 10*3/uL (ref 0.0–0.1)
Basophils Relative: 0 %
Eosinophils Absolute: 0.1 10*3/uL (ref 0.0–0.5)
Eosinophils Relative: 0 %
HCT: 33.4 % — ABNORMAL LOW (ref 36.0–46.0)
Hemoglobin: 11.3 g/dL — ABNORMAL LOW (ref 12.0–15.0)
Immature Granulocytes: 5 %
LYMPHS ABS: 2.1 10*3/uL (ref 0.7–4.0)
Lymphocytes Relative: 14 %
MCH: 32.3 pg (ref 26.0–34.0)
MCHC: 33.8 g/dL (ref 30.0–36.0)
MCV: 95.4 fL (ref 80.0–100.0)
Monocytes Absolute: 2.7 10*3/uL — ABNORMAL HIGH (ref 0.1–1.0)
Monocytes Relative: 17 %
NRBC: 0.4 % — AB (ref 0.0–0.2)
Neutro Abs: 9.7 10*3/uL — ABNORMAL HIGH (ref 1.7–7.7)
Neutrophils Relative %: 64 %
Platelet Count: 86 10*3/uL — ABNORMAL LOW (ref 150–400)
RBC: 3.5 MIL/uL — ABNORMAL LOW (ref 3.87–5.11)
RDW: 15.1 % (ref 11.5–15.5)
WBC Count: 15.2 10*3/uL — ABNORMAL HIGH (ref 4.0–10.5)

## 2018-11-19 LAB — MAGNESIUM: Magnesium: 1.1 mg/dL — CL (ref 1.7–2.4)

## 2018-11-19 MED ORDER — ONDANSETRON HCL 4 MG/2ML IJ SOLN
4.0000 mg | Freq: Once | INTRAMUSCULAR | Status: AC
  Administered 2018-11-19: 4 mg via INTRAVENOUS

## 2018-11-19 MED ORDER — ONDANSETRON HCL 4 MG/2ML IJ SOLN
INTRAMUSCULAR | Status: AC
  Filled 2018-11-19: qty 2

## 2018-11-19 MED ORDER — DIPHENOXYLATE-ATROPINE 2.5-0.025 MG PO TABS
ORAL_TABLET | ORAL | Status: AC
  Filled 2018-11-19: qty 2

## 2018-11-19 MED ORDER — HEPARIN SOD (PORK) LOCK FLUSH 100 UNIT/ML IV SOLN
500.0000 [IU] | Freq: Once | INTRAVENOUS | Status: AC
  Administered 2018-11-19: 500 [IU]
  Filled 2018-11-19: qty 5

## 2018-11-19 MED ORDER — SODIUM CHLORIDE 0.9 % IV SOLN
6.0000 g | Freq: Once | INTRAVENOUS | Status: AC
  Administered 2018-11-19: 6 g via INTRAVENOUS
  Filled 2018-11-19: qty 12

## 2018-11-19 MED ORDER — DIPHENOXYLATE-ATROPINE 2.5-0.025 MG PO TABS
2.0000 | ORAL_TABLET | Freq: Once | ORAL | Status: AC
  Administered 2018-11-19: 2 via ORAL

## 2018-11-19 MED ORDER — FLUCONAZOLE 150 MG PO TABS: ORAL_TABLET | ORAL | 0 refills | Status: DC

## 2018-11-19 MED ORDER — SODIUM CHLORIDE 0.9% FLUSH
10.0000 mL | Freq: Once | INTRAVENOUS | Status: AC
  Administered 2018-11-19: 10 mL
  Filled 2018-11-19: qty 10

## 2018-11-19 NOTE — Patient Instructions (Signed)
Hypomagnesemia  Hypomagnesemia is a condition in which the level of magnesium in the blood is low. Magnesium is a mineral that is found in many foods. It is used in many different processes in the body. Hypomagnesemia can affect every organ in the body. In severe cases, it can cause life-threatening problems.  What are the causes?  This condition may be caused by:   Not getting enough magnesium in your diet.   Malnutrition.   Problems with absorbing magnesium from the intestines.   Dehydration.   Alcohol abuse.   Vomiting.   Severe or chronic diarrhea.   Some medicines, including medicines that make you urinate more (diuretics).   Certain diseases, such as kidney disease, diabetes, celiac disease, and overactive thyroid.  What are the signs or symptoms?  Symptoms of this condition include:   Loss of appetite.   Nausea and vomiting.   Involuntary shaking or trembling of a body part (tremor).   Muscle weakness.   Tingling in the arms and legs.   Sudden tightening of muscles (muscle spasms).   Confusion.   Psychiatric issues, such as depression, irritability, or psychosis.   A feeling of fluttering of the heart.   Seizures.  These symptoms are more severe if magnesium levels drop suddenly.  How is this diagnosed?  This condition may be diagnosed based on:   Your symptoms and medical history.   A physical exam.   Blood and urine tests.  How is this treated?  Treatment depends on the cause and the severity of the condition. It may be treated with:   A magnesium supplement. This can be taken in pill form. If the condition is severe, magnesium is usually given through an IV.   Changes to your diet. You may be directed to eat foods that have a lot of magnesium, such as green leafy vegetables, peas, beans, and nuts.   Stopping any intake of alcohol.  Follow these instructions at home:          Make sure that your diet includes foods with magnesium. Foods that have a lot of magnesium in them  include:  ? Green leafy vegetables, such as spinach and broccoli.  ? Beans and peas.  ? Nuts and seeds, such as almonds and sunflower seeds.  ? Whole grains, such as whole grain bread and fortified cereals.   Take magnesium supplements if your health care provider tells you to do that. Take them as directed.   Take over-the-counter and prescription medicines only as told by your health care provider.   Have your magnesium levels monitored as told by your health care provider.   When you are active, drink fluids that contain electrolytes.   Avoid drinking alcohol.   Keep all follow-up visits as told by your health care provider. This is important.  Contact a health care provider if:   You get worse instead of better.   Your symptoms return.  Get help right away if you:   Develop severe muscle weakness.   Have trouble breathing.   Feel that your heart is racing.  Summary   Hypomagnesemia is a condition in which the level of magnesium in the blood is low.   Hypomagnesemia can affect every organ in the body.   Treatment may include eating more foods that contain magnesium, taking magnesium supplements, and not drinking alcohol.   Have your magnesium levels monitored as told by your health care provider.  This information is not intended to replace advice given   to you by your health care provider. Make sure you discuss any questions you have with your health care provider.  Document Released: 05/08/2005 Document Revised: 07/14/2017 Document Reviewed: 07/14/2017  Elsevier Interactive Patient Education  2019 [employer]

## 2018-11-19 NOTE — Progress Notes (Signed)
Pt given IVF with magnesium, antiemetics, and anti-diarrheal medication.  Tolerated well.  Reports feeling better at end of infusion.  Ate and drank during infusion without issue.  Denies any further questions or concerns at time of d/c.

## 2018-11-19 NOTE — Telephone Encounter (Signed)
Received a call from pt this morning stating since 9 pm last night she has had 4-5 watery loose stools.  Pt states that she has taken one dose of immodium and lamotil last night and one dose of lamotil this morning with no relief.  Pt states she is unable to drink fluids and feels very dehydration and wants to come in today to be evaluated.  Pt denise any symptoms of covid 19. Instructed pt that I would send scheduling a message for her to be seen by Sandi Mealy with symptom management today.  Pt very appreciative.

## 2018-11-20 NOTE — Progress Notes (Signed)
Sympt[facility] Management Clinic Progress Note   Katie [name] [id number] Oct 29, 1945 73 y.o.  Katie [name] is managed by [name], MD  Actively treated with chemotherapy/immunotherapy/hormonal therapy: yes  Current therapy: carboplatin, taxotere, and Herceptin with Udenyca   Last treated:   11/11/2018  Next scheduled appointment with provider:    Assessment: Plan:    Hypomagnesemia - Plan: magnesium sulfate 6 g in sodium chloride 0.9 % 1,000 mL  Non-intractable vomiting with nausea, unspecified vomiting type - Plan: ondansetron (ZOFRAN) injection 4 mg  Diarrhea, unspecified type - Plan: diphenoxylate-atropine (LOMOTIL) 2.5-0.025 MG per tablet 2 tablet  Port-A-Cath in place - Plan: heparin lock flush 100 unit/mL, sodium chloride flush (NS) 0.9 % injection 10 mL  Malignant neoplasm of upper-outer quadrant of left breast in female, estrogen receptor positive ([place]) - Plan: heparin lock flush 100 unit/mL, sodium chloride flush (NS) 0.9 % injection 10 mL   Hypomagnesemia: Patient's labs returned showing a magnesium of 1.1.  She was given 6 g of magnesium and 1 L of normal saline today.  Nausea and vomiting: The patient was given Zofran 4 mg IV x1.  Diarrhea: The patient was given Lomotil 2 tablets p.o. x1.  ER positive malignant neoplasm of the left breast: The patient continues to be followed by Dr. [name] and was last treated with carboplatin, taxotere, and Herceptin with Udenyca on 11/11/2018.  Please see After Visit Summary for patient specific instructions.  Future Appointments  Date Time Provider [place]  12/02/2018  7:45 AM [facility] LAB 3 [facility] None  12/02/2018  8:00 AM [facility] [place] None  12/02/2018  8:15 AM [name], MD [facility] None  12/02/2018  9:30 AM [facility] INFUSION [facility] None  12/22/2018  8:45 AM [facility] LAB 5 [facility] None  12/22/2018  9:00 AM [facility] [place] None  12/22/2018  9:30 AM  [name], MD [facility] None  12/23/2018  8:00 AM [facility] INFUSION [facility] None  12/30/2018  8:45 AM [facility] LAB 4 [facility] None  12/30/2018  9:00 AM [facility] [place] FLUSH [facility] None  12/30/2018  9:30 AM Causey, Charlestine Massed, NP [facility] None  12/30/2018 10:30 AM [facility] INFUSION [facility] None  01/05/2019  8:30 AM Eustace Pen, LPN [facility] PEC  [id number]  9:15 AM [facility] LAB 5 [facility] None  01/12/2019  9:30 AM [facility] [place] FLUSH [facility] None  01/12/2019 10:00 AM [name], MD [facility] None  01/12/2019  3:30 PM Bedsole, Amy E, MD [facility]  01/13/2019  8:00 AM [facility] INFUSION [facility] None  01/20/2019  8:00 AM [facility] LAB 4 [facility] None  01/20/2019  8:15 AM [facility] [place] FLUSH [facility] None  01/20/2019  8:45 AM [name], MD [facility] None  01/20/2019  9:30 AM [facility] INFUSION [facility] None    No orders of the defined types were placed in this encounter.      Subjective:   Patient ID:  Katie [name] is a 73 y.o. (DOB Aug 26, 1946) female.  Chief Complaint:  Chief Complaint  Patient presents with  . Diarrhea    HPI Katie [name] is a 73 year old female with a history of an ER positive left breast cancer who is managed by Dr. [name] and was last treated with carboplatin, taxotere, and Herceptin with Udenyca on 11/11/2018.  She has had multiple watery bowel movements over the last several days along with anorexia, nausea, and fatigue.  She reports that she has not been taking Imodium regularly.  She took 1 Lomotil last evening  and 1 Lomotil and 1 Imodium this morning.  She denies fevers, chills, sweats, headache, or acute pain.  Medications: I have reviewed the patient's current medications.  Allergies:  Allergies  Allergen Reactions  . Erythromycin Anaphylaxis    REACTION: Rash, hives  . Ciprofloxacin Rash  . Daypro [Oxaprozin]   . Enalapril Maleate Nausea Only  . Nsaids Nausea Only  .  Vioxx [Rofecoxib]     Past Medical History:  Diagnosis Date  . Allergy   . Anemia   . Blood transfusion without reported diagnosis   . Diverticulitis   . DJD (degenerative joint disease)   . DM type 2 (diabetes mellitus, type 2) ([place])   . Family history of breast cancer   . Fibrocystic breast disease   . GERD (gastroesophageal reflux disease)   . History of chicken pox   . Hypercholesteremia   . Hypertension   . Lichen planopilaris   . Urinary incontinence     Past Surgical History:  Procedure Laterality Date  . BREAST SURGERY  02/1975   Left Breast Biopsy-Fibrocystic Disease  . BREAST SURGERY  10/1981   Bialteral Capsulectomy Breast Surgery  . BREAST SURGERY  07/1998   Replace Bilateral Silicone Implants  . BREAST SURGERY  01/1976   Bilater breast surgery to remove fibrocystic tissue and replace with silicone implants  . CHOLECYSTECTOMY N/A 07/08/2016   Procedure: LAPAROSCOPIC CHOLECYSTECTOMY;  Surgeon: [name], MD;  Location: [facility] ORS;  Service: General;  Laterality: N/A;  . COLONOSCOPY WITH PROPOFOL N/A 11/07/2017   Procedure: COLONOSCOPY WITH PROPOFOL;  Surgeon: Jonathon Bellows, MD;  Location: [facility] ENDOSCOPY;  Service: Gastroenterology;  Laterality: N/A;  . DILATION AND CURETTAGE OF UTERUS  01/1994  . ESOPHAGOGASTRODUODENOSCOPY (EGD) WITH PROPOFOL N/A 12/05/2016   Procedure: ESOPHAGOGASTRODUODENOSCOPY (EGD) WITH PROPOFOL;  Surgeon: Jonathon Bellows, MD;  Location: [facility] ENDOSCOPY;  Service: Endoscopy;  Laterality: N/A;  . FINGER SURGERY  2001 & 2003   Trigger Finger  . FOOT SURGERY  1980   To Relieve Pinched Nerve  . FOOT SURGERY  07/2008   Left Foot-Plantar Fasciitis  . JOINT REPLACEMENT  04/19/2013   Hip Replacement-Right  . PLACEMENT OF BREAST IMPLANTS  12/26/2004   Replace Failed Implants  . RHINOPLASTY  03/1964   To Correct Deviated Septum    Family History  Problem Relation Age of Onset  . Arthritis Mother   . Hypertension Mother   . Heart disease Father   .  Diabetes Father   . Breast cancer Sister 32       d. 84  . Hypertension Brother   . Leukemia Brother   . Coronary artery disease Brother     Social History   Socioeconomic History  . Marital status: Married    Spouse name: Not on file  . Number of children: 1  . Years of education: Not on file  . Highest education level: Not on file  Occupational History  . Occupation: Retired  Scientific laboratory technician  . Financial resource strain: Not on file  . Food insecurity:    Worry: Not on file    Inability: Not on file  . Transportation needs:    Medical: Not on file    Non-medical: Not on file  Tobacco Use  . Smoking status: Never Smoker  . Smokeless tobacco: Never Used  Substance and Sexual Activity  . Alcohol use: No    Alcohol/week: 0.0 standard drinks  . Drug use: No  . Sexual activity: Never    Partners: Male  Lifestyle  . Physical activity:    Days per week: Not on file    Minutes per session: Not on file  . Stress: Not on file  Relationships  . Social connections:    Talks on phone: Not on file    Gets together: Not on file    Attends religious service: Not on file    Active member of club or organization: Not on file    Attends meetings of clubs or organizations: Not on file    Relationship status: Not on file  . Intimate partner violence:    Fear of current or ex partner: Not on file    Emotionally abused: Not on file    Physically abused: Not on file    Forced sexual activity: Not on file  Other Topics Concern  . Not on file  Social History Narrative   Married for 37 years.    She has one child and one stepston.       Past Medical History, Surgical history, Social history, and Family history were reviewed and updated as appropriate.   Please see review of systems for further details on the patient's review from today.   Review of Systems:  Review of Systems  Constitutional: Positive for appetite change and fatigue. Negative for chills, diaphoresis and fever.   HENT: Negative for trouble swallowing.   Respiratory: Negative for cough, chest tightness and shortness of breath.   Cardiovascular: Negative for chest pain, palpitations and leg swelling.  Gastrointestinal: Positive for diarrhea. Negative for abdominal distention, abdominal pain, blood in stool, constipation, nausea and vomiting.  Genitourinary: Negative for decreased urine volume and difficulty urinating.  Neurological: Positive for weakness.    Objective:   Physical Exam:  BP (!) 161/63 (BP Location: Left Arm, Patient Position: Sitting)   Pulse 91   Temp 98.4 F (36.9 C) (Oral)   Resp 18   Ht 5\' 2"  (1.575 m)   Wt 156 lb 6.4 oz (70.9 kg)   LMP 08/26/1992 (Approximate)   SpO2 97%   BMI 28.61 kg/m  ECOG: 1  Physical Exam Constitutional:      General: She is not in acute distress.    Appearance: She is not diaphoretic.  HENT:     Head: Normocephalic and atraumatic.  Eyes:     General:        Right eye: No discharge.        Left eye: No discharge.     Conjunctiva/sclera: Conjunctivae normal.  Cardiovascular:     Rate and Rhythm: Normal rate and regular rhythm.     Heart sounds: Normal heart sounds. No murmur. No friction rub. No gallop.   Pulmonary:     Effort: Pulmonary effort is normal. No respiratory distress.     Breath sounds: Normal breath sounds. No stridor. No wheezing or rales.  Abdominal:     General: Bowel sounds are normal. There is no distension.     Palpations: There is no mass.     Tenderness: There is no abdominal tenderness. There is no guarding.  Skin:    General: Skin is warm and dry.     Coloration: Skin is not pale.     Findings: No erythema.  Neurological:     Mental Status: She is alert.  Psychiatric:        Behavior: Behavior normal.        Thought Content: Thought content normal.        Judgment: Judgment normal.  Lab Review:     Component Value Date/Time   NA 137 11/19/2018 1155   K 4.0 11/19/2018 1155   CL 104 11/19/2018 1155    CO2 23 11/19/2018 1155   GLUCOSE 151 (H) 11/19/2018 1155   BUN 9 11/19/2018 1155   CREATININE 0.85 11/19/2018 1155   CALCIUM 9.3 11/19/2018 1155   PROT 7.0 11/19/2018 1155   ALBUMIN 4.0 11/19/2018 1155   AST 14 (L) 11/19/2018 1155   ALT 15 11/19/2018 1155   ALKPHOS 96 11/19/2018 1155   BILITOT 0.6 11/19/2018 1155   GFRNONAA >60 11/19/2018 1155   GFRAA >60 11/19/2018 1155       Component Value Date/Time   WBC 15.2 (H) 11/19/2018 1155   WBC 8.9 10/11/2018 0700   RBC 3.50 (L) 11/19/2018 1155   HGB 11.3 (L) 11/19/2018 1155   HGB 10.0 (L) 04/29/2013 0812   HCT 33.4 (L) 11/19/2018 1155   HCT 29.1 (L) 04/29/2013 0812   PLT 86 (L) 11/19/2018 1155   PLT 378 04/29/2013 0812   MCV 95.4 11/19/2018 1155   MCV 95 04/29/2013 0812   MCH 32.3 11/19/2018 1155   MCHC 33.8 11/19/2018 1155   RDW 15.1 11/19/2018 1155   RDW 15.5 (H) 04/29/2013 0812   LYMPHSABS 2.1 11/19/2018 1155   LYMPHSABS 1.4 04/29/2013 0812   MONOABS 2.7 (H) 11/19/2018 1155   MONOABS 0.7 04/29/2013 0812   EOSABS 0.1 11/19/2018 1155   EOSABS 0.4 04/29/2013 0812   BASOSABS 0.0 11/19/2018 1155   BASOSABS 0.1 04/29/2013 0812   -------------------------------  Imaging from last 24 hours (if applicable):  Radiology interpretation: No results found.

## 2018-11-30 NOTE — Progress Notes (Signed)
Patient Care Team: [name], MD as PCP - General (Family Medicine) Birder [name], MD as Referring Physician (Ophthalmology) [name], MD as Consulting Physician (Dermatology) [name], MD as Consulting Physician (Orthopedic Surgery) [name], MD as Consulting Physician (General Surgery) [name], MD as Consulting Physician (Dentistry) [name], MD as Consulting Physician (General Surgery) [name], MD as Consulting Physician (Hematology and Oncology) [name], MD as Attending Physician (Radiation Oncology)  DIAGNOSIS:    ICD-10-CM   1. Malignant neoplasm of upper-outer quadrant of left breast in female, estrogen receptor positive ([place]) C50.412    Z17.0     SUMMARY OF ONCOLOGIC HISTORY:   Malignant neoplasm of upper-outer quadrant of left breast in female, estrogen receptor positive ([place])   09/14/2018 Initial Diagnosis    With prior history of breast implants that were ruptured/replaced, patient had cellulitis LIQ left breast for 1 week, and UOQ asymmetry 1.5 cm, MRI right breast biopsy benign, left breast numerous masses 7 cm skin thickening: 2 biopsies from left breast: Grade 2-3 IDC ER PR positive, HER-2 positive, Ki-67 50%    09/16/2018 Cancer Staging    Staging form: Breast, AJCC 8th Edition - Clinical stage from 09/16/2018: Stage IIIB (cT4, cN0, cM0, G3, ER+, PR+, HER2+) - Signed by [name], MD on 09/16/2018    09/29/2018 -  Neo-Adjuvant Chemotherapy    TCHP q 3 weeks     CHIEF COMPLIANT: Cycle 4 TCH  INTERVAL HISTORY: Katie [name] is a 73 y.o. with above-mentioned history of left breast cancer currently receiving neoadjuvant chemotherapy with TCHP. On 11/19/18 she presented to the symptom management clinic for dehydration and 5 episodes of diarrhea the night before. She presents to the clinic alone today for cycle 4.  She gets diarrhea 2 to 3 days after each of the chemotherapy treatments.  She has required  symptom management follow-up and IV fluids.  Because of this, we will set her up for IV fluids 1 week after each of her next few treatments. I would also like to consolidate her appointments because we want to avoid unnecessary exposure and trips to the hospital. She also complained of nausea with vomiting.  REVIEW OF SYSTEMS:   Constitutional: Denies fevers, chills or abnormal weight loss Eyes: Denies blurriness of vision Ears, nose, mouth, throat, and face: Denies mucositis or sore throat Respiratory: Denies cough, dyspnea or wheezes Cardiovascular: Denies palpitation, chest discomfort Gastrointestinal: Diarrhea 2 to 3 days after chemo, nausea with vomiting Skin: Denies abnormal skin rashes Lymphatics: Denies new lymphadenopathy or easy bruising Neurological: Denies numbness, tingling or new weaknesses Behavioral/Psych: Mood is stable, no new changes  Extremities: No lower extremity edema Breast: denies any pain or lumps or nodules in either breasts All other systems were reviewed with the patient and are negative.  I have reviewed the past medical history, past surgical history, social history and family history with the patient and they are unchanged from previous note.  ALLERGIES:  is allergic to erythromycin; ciprofloxacin; daypro [oxaprozin]; enalapril maleate; nsaids; and vioxx [rofecoxib].  MEDICATIONS:  Current Outpatient Medications  Medication Sig Dispense Refill  . acetaminophen (TYLENOL) 325 MG tablet Take 650 mg by mouth every 6 (six) hours as needed for mild pain or headache.    [name] amLODipine (NORVASC) 5 MG tablet Take 1 tablet (5 mg total) by mouth daily. 30 tablet 11  . atorvastatin (LIPITOR) 10 MG tablet TAKE 1 TABLET(10 MG) BY MOUTH DAILY (Patient taking differently: Take 10 mg by mouth daily  at 6 PM. ) 90 tablet 0  . Azelastine HCl 137 MCG/SPRAY SOLN instill 1 spray into each nostril twice a day for 1 week then if needed 30 mL 5  . cholecalciferol (VITAMIN D) 400  units TABS tablet Take 400 Units by mouth 2 (two) times daily.    . cholestyramine (QUESTRAN) 4 g packet Take 1 packet (4 g total) by mouth 3 (three) times daily with meals. 60 each 12  . clobetasol (TEMOVATE) 0.05 % external solution Apply 1 application topically 2 (two) times daily as needed (scalp). Reported on 08/16/2015    . diphenoxylate-atropine (LOMOTIL) 2.5-0.025 MG tablet     . fluconazole (DIFLUCAN) 150 MG tablet Take 1 tablet for a yeast infection if needed, may repeat in 3 days if needed 2 tablet 0  . fluticasone (FLONASE) 50 MCG/ACT nasal spray INSTILL 2 SPRAYS INTO EACH NOSTRIL ONCE DAILY 16 g 5  . glucose blood test strip USE TO CHECK BLOOD SUGAR DAILY 50 each 12  . lidocaine-prilocaine (EMLA) cream Apply to affected area once (Patient taking differently: Apply 1 application topically as needed (port access). ) 30 g 3  . loperamide (IMODIUM) 2 MG capsule Take 2 mg by mouth as needed for diarrhea or loose stools.    [name] loratadine (CLARITIN) 10 MG tablet Take 10 mg by mouth daily.    [name] LORazepam (ATIVAN) 0.5 MG tablet TAKE 1 TABLET(0.5 MG) BY MOUTH AT BEDTIME AS NEEDED FOR SLEEP 30 tablet 0  . metFORMIN (GLUCOPHAGE-XR) 500 MG 24 hr tablet Take 1 tablet (500 mg total) by mouth daily with breakfast. (Patient taking differently: Take 500 mg by mouth at bedtime. ) 90 tablet 3  . metroNIDAZOLE (METROCREAM) 0.75 % cream Apply 1 application topically 2 (two) times daily as needed (rosacea).     . Multiple Vitamin (MULTIVITAMIN WITH MINERALS) TABS tablet Take 1 tablet by mouth daily.    . Olopatadine HCl 0.2 % SOLN 1 drop per eye daily 2.5 mL 0  . ondansetron (ZOFRAN) 8 MG tablet Take 1 tablet (8 mg total) by mouth 2 (two) times daily as needed (Nausea or vomiting). Begin 4 days after chemotherapy. 30 tablet 1  . prochlorperazine (COMPAZINE) 10 MG tablet Take 1 tablet (10 mg total) by mouth every 6 (six) hours as needed (Nausea or vomiting). 30 tablet 1  . triamcinolone cream (KENALOG) 0.1 %  Apply 1 application topically 2 (two) times daily. (Patient taking differently: Apply 1 application topically 2 (two) times daily as needed (skin rashes). ) 15 g 0  . valACYclovir (VALTREX) 500 MG tablet Take 1 tablet (500 mg total) by mouth 2 (two) times daily. (Patient taking differently: Take 500 mg by mouth as needed (flare up). ) 6 tablet 2   No current facility-administered medications for this visit.     PHYSICAL EXAMINATION: ECOG PERFORMANCE STATUS: 1 - Symptomatic but completely ambulatory  Vitals:   12/02/18 0820  BP: (!) 126/59  Pulse: 75  Resp: 16  Temp: 98 F (36.7 C)  SpO2: 98%   Filed Weights   12/02/18 0820  Weight: 157 lb 3.2 oz (71.3 kg)   Physical exam was not performed because this is done through [employer]. LABORATORY DATA:  I have reviewed the data as listed CMP Latest Ref Rng & Units 11/19/2018 11/13/2018 11/11/2018  Glucose 70 - 99 mg/dL 151(H) 133(H) 137(H)  BUN 8 - 23 mg/dL 9 23 15   Creatinine 0.44 - 1.00 mg/dL 0.85 0.79 0.80  Sodium 135 - 145  mmol/L 137 140 141  Potassium 3.5 - 5.1 mmol/L 4.0 3.6 3.6  Chloride 98 - 111 mmol/L 104 107 107  CO2 22 - 32 mmol/L 23 22 22   Calcium 8.9 - 10.3 mg/dL 9.3 8.4(L) 9.4  Total Protein 6.5 - 8.1 g/dL 7.0 6.7 7.0  Total Bilirubin 0.3 - 1.2 mg/dL 0.6 0.5 0.6  Alkaline Phos 38 - 126 U/L 96 71 79  AST 15 - 41 U/L 14(L) 27 18  ALT 0 - 44 U/L 15 27 25     Lab Results  Component Value Date   WBC 3.8 (L) 12/02/2018   HGB 10.3 (L) 12/02/2018   HCT 30.7 (L) 12/02/2018   MCV 100.3 (H) 12/02/2018   PLT 140 (L) 12/02/2018   NEUTROABS 2.2 12/02/2018    ASSESSMENT & PLAN:  Malignant neoplasm of upper-outer quadrant of left breast in female, estrogen receptor positive ([place]) With prior history of breast implants that were ruptured/replaced, patient had cellulitis LIQ left breast for 1 week, and UOQ asymmetry 1.5 cm, MRI right breast biopsy benign, left breast numerous masses 7 cm skin thickening: 2 biopsies from left  breast: Grade 2-3 IDC ER PR positive, HER-2 positive, Ki-67 50% Clinical stage: T4 N0 M0 stage IIIb  Treatment plan 1. Neoadjuvant chemotherapy with TCH Perjeta 6 cycles followed by Herceptin maintenance for 1 year 2. Followed by mastectomy with sentinel lymph node study 3. Followed by adjuvant radiation therapy ---------------------------------------------------------------  CT chest abdomen pelvis and bone scan: 09/28/2018: 3 mm lung nodule and 1 cm liver subcapsular lesion MRI liver on 2/18 shows benign cystic lesion.The lung nodule will need a CT in 12/2018. She had a bone lesion on the bone scan which is related to her knee replacement surgery and does not need any additional work-up. ------------------------------------------------------------------- Current treatment: Cycle4day1neoadjuvant TCH (Perjeta discontinued after cycle 1) Chemo toxicities: 1.Severe diarrhea: controlled with Questran however she still gets diarrhea for couple of days after each chemo.  Because of this I will set her up for IV fluids 1 week after each treatment. 2.Fatigue: improved since blood transfusion 3.Leukopenia:Taxotere and Carbo were dose reduced with cycle 2.  ANC today is 2.2.  Okay to receive chemotherapy.  4.Nausea and vomiting:Much improved, only experienced once, and resolved with anti emetics. 5. Anemia: Received 2 units of PRBCs , hemoglobin is slowly starting to creep downwards.  Monitoring closely.  6. Peripheral neuropathy: mild and intermittent, will monitor for now 7. Hypokalemia: Monitoring closely , awaiting today's CMP. 8. H/o diverticulitis: She knows to call us if she develops symptoms.  Labs have been reviewed Return to clinic in 3weeks for cycle 5 The treatment plan was discussed.  After the completion of 6 cycles we will obtain a breast MRI and surgery evaluation.  She understands that surgery will not happen until at least a month after chemotherapy assuming  the coronavirus pandemic has improved by that time.   No orders of the defined types were placed in this encounter.  The patient has a good understanding of the overall plan. she agrees with it. she will call with any problems that may develop before the next visit here.  [name], MD 12/02/2018  Julious Oka Dorshimer am acting as scribe for Dr. [name].  I have reviewed the above documentation for accuracy and completeness, and I agree with the above.

## 2018-12-01 ENCOUNTER — Ambulatory Visit: Payer: Medicare Other | Admitting: Adult Health

## 2018-12-01 ENCOUNTER — Other Ambulatory Visit: Payer: Medicare Other

## 2018-12-01 ENCOUNTER — Telehealth: Payer: Self-pay | Admitting: Hematology and Oncology

## 2018-12-01 NOTE — Assessment & Plan Note (Signed)
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 Neoadjuvant chemotherapy with TCH Perjeta 6 cycles followed by Herceptin maintenance for 1 year 2. Followed by mastectomy with sentinel lymph node study 3. Followed by adjuvant radiation therapy ---------------------------------------------------------------  CT chest abdomen pelvis and bone scan: 09/28/2018: 3 mm lung nodule and 1 cm liver subcapsular lesion MRI liver on 2/18 shows benign cystic lesion.The lung nodule will need a CT in 12/2018. She had a bone lesion on the bone scan which is related to her knee replacement surgery and does not need any additional work-up. ------------------------------------------------------------------- Current treatment: Cycle4day1neoadjuvant TCH (Perjeta discontinued after cycle 1) Chemo toxicities: 1.Severe diarrhea: Occasional diarrhea which is well controlled with Questran 2.Fatigue: improved since blood transfusion 3.Leukopenia:Taxotere and Carbo were dose reduced with cycle 2.    4.Nausea and vomiting:Much improved, only experienced once, and resolved with anti emetics. 5. Anemia: Received 2 units of PRBCs   6. Peripheral neuropathy: mild and intermittent, will monitor for now 7. Hypokalemia: Monitoring closely  8. H/o diverticulitis: She knows to call us if she develops symptoms.  Labs have been reviewed Return to clinic in 3weeks for cycle 5

## 2018-12-01 NOTE — Telephone Encounter (Signed)
Added injection 4/10 per 4/6 schedule message. Also added injection day 2 after 4/29 and 5/20 chemo per care plan. Spoke with patient. She will get updated schedule tomorrow.

## 2018-12-02 ENCOUNTER — Other Ambulatory Visit: Payer: Self-pay

## 2018-12-02 ENCOUNTER — Inpatient Hospital Stay: Payer: Medicare Other | Attending: Hematology and Oncology

## 2018-12-02 ENCOUNTER — Inpatient Hospital Stay (HOSPITAL_BASED_OUTPATIENT_CLINIC_OR_DEPARTMENT_OTHER): Payer: Medicare Other | Admitting: Hematology and Oncology

## 2018-12-02 ENCOUNTER — Inpatient Hospital Stay: Payer: Medicare Other

## 2018-12-02 ENCOUNTER — Ambulatory Visit: Payer: Medicare Other

## 2018-12-02 DIAGNOSIS — D6481 Anemia due to antineoplastic chemotherapy: Secondary | ICD-10-CM

## 2018-12-02 DIAGNOSIS — Z5111 Encounter for antineoplastic chemotherapy: Secondary | ICD-10-CM | POA: Insufficient documentation

## 2018-12-02 DIAGNOSIS — R112 Nausea with vomiting, unspecified: Secondary | ICD-10-CM | POA: Diagnosis not present

## 2018-12-02 DIAGNOSIS — R197 Diarrhea, unspecified: Secondary | ICD-10-CM | POA: Diagnosis not present

## 2018-12-02 DIAGNOSIS — Z79899 Other long term (current) drug therapy: Secondary | ICD-10-CM | POA: Insufficient documentation

## 2018-12-02 DIAGNOSIS — G620 Drug-induced polyneuropathy: Secondary | ICD-10-CM | POA: Insufficient documentation

## 2018-12-02 DIAGNOSIS — E876 Hypokalemia: Secondary | ICD-10-CM | POA: Insufficient documentation

## 2018-12-02 DIAGNOSIS — C50412 Malignant neoplasm of upper-outer quadrant of left female breast: Secondary | ICD-10-CM

## 2018-12-02 DIAGNOSIS — Z170 Estrogen receptor positive status [ER+]: Secondary | ICD-10-CM | POA: Insufficient documentation

## 2018-12-02 DIAGNOSIS — Z5112 Encounter for antineoplastic immunotherapy: Secondary | ICD-10-CM | POA: Insufficient documentation

## 2018-12-02 DIAGNOSIS — Z95828 Presence of other vascular implants and grafts: Secondary | ICD-10-CM

## 2018-12-02 LAB — CMP (CANCER CENTER ONLY)
ALT: 24 U/L (ref 0–44)
AST: 21 U/L (ref 15–41)
Albumin: 3.6 g/dL (ref 3.5–5.0)
Alkaline Phosphatase: 72 U/L (ref 38–126)
Anion gap: 10 (ref 5–15)
BUN: 12 mg/dL (ref 8–23)
CO2: 22 mmol/L (ref 22–32)
Calcium: 9 mg/dL (ref 8.9–10.3)
Chloride: 109 mmol/L (ref 98–111)
Creatinine: 0.76 mg/dL (ref 0.44–1.00)
GFR, Est AFR Am: >60 mL/min (ref >60)
GFR, Estimated: >60 mL/min (ref >60)
Glucose, Bld: 103 mg/dL — ABNORMAL HIGH (ref 70–99)
Potassium: 3.9 mmol/L (ref 3.5–5.1)
Sodium: 141 mmol/L (ref 135–145)
Total Bilirubin: 0.5 mg/dL (ref 0.3–1.2)
Total Protein: 6.5 g/dL (ref 6.5–8.1)

## 2018-12-02 LAB — CBC WITH DIFFERENTIAL (CANCER CENTER ONLY)
Abs Immature Granulocytes: 0.01 10*3/uL (ref 0.00–0.07)
Basophils Absolute: 0 10*3/uL (ref 0.0–0.1)
Basophils Relative: 1 %
Eosinophils Absolute: 0 10*3/uL (ref 0.0–0.5)
Eosinophils Relative: 0 %
HCT: 30.7 % — ABNORMAL LOW (ref 36.0–46.0)
Hemoglobin: 10.3 g/dL — ABNORMAL LOW (ref 12.0–15.0)
Immature Granulocytes: 0 %
Lymphocytes Relative: 29 %
Lymphs Abs: 1.1 10*3/uL (ref 0.7–4.0)
MCH: 33.7 pg (ref 26.0–34.0)
MCHC: 33.6 g/dL (ref 30.0–36.0)
MCV: 100.3 fL — ABNORMAL HIGH (ref 80.0–100.0)
Monocytes Absolute: 0.4 10*3/uL (ref 0.1–1.0)
Monocytes Relative: 12 %
Neutro Abs: 2.2 10*3/uL (ref 1.7–7.7)
Neutrophils Relative %: 58 %
Platelet Count: 140 10*3/uL — ABNORMAL LOW (ref 150–400)
RBC: 3.06 MIL/uL — ABNORMAL LOW (ref 3.87–5.11)
RDW: 17.8 % — ABNORMAL HIGH (ref 11.5–15.5)
WBC Count: 3.8 10*3/uL — ABNORMAL LOW (ref 4.0–10.5)
nRBC: 0 % (ref 0.0–0.2)

## 2018-12-02 MED ORDER — DIPHENHYDRAMINE HCL 25 MG PO CAPS
ORAL_CAPSULE | ORAL | Status: AC
  Filled 2018-12-02: qty 2

## 2018-12-02 MED ORDER — ACETAMINOPHEN 325 MG PO TABS
650.0000 mg | ORAL_TABLET | Freq: Once | ORAL | Status: AC
  Administered 2018-12-02: 650 mg via ORAL

## 2018-12-02 MED ORDER — PALONOSETRON HCL INJECTION 0.25 MG/5ML
0.2500 mg | Freq: Once | INTRAVENOUS | Status: AC
  Administered 2018-12-02: 0.25 mg via INTRAVENOUS

## 2018-12-02 MED ORDER — SODIUM CHLORIDE 0.9 % IV SOLN
Freq: Once | INTRAVENOUS | Status: AC
  Administered 2018-12-02: 09:00:00 via INTRAVENOUS
  Filled 2018-12-02: qty 5

## 2018-12-02 MED ORDER — SODIUM CHLORIDE 0.9 % IV SOLN
60.0000 mg/m2 | Freq: Once | INTRAVENOUS | Status: AC
  Administered 2018-12-02: 110 mg via INTRAVENOUS
  Filled 2018-12-02: qty 11

## 2018-12-02 MED ORDER — SODIUM CHLORIDE 0.9 % IV SOLN
Freq: Once | INTRAVENOUS | Status: AC
  Administered 2018-12-02: 09:00:00 via INTRAVENOUS
  Filled 2018-12-02: qty 250

## 2018-12-02 MED ORDER — SODIUM CHLORIDE 0.9% FLUSH
10.0000 mL | Freq: Once | INTRAVENOUS | Status: AC
  Administered 2018-12-02: 08:00:00 10 mL
  Filled 2018-12-02: qty 10

## 2018-12-02 MED ORDER — PALONOSETRON HCL INJECTION 0.25 MG/5ML
INTRAVENOUS | Status: AC
  Filled 2018-12-02: qty 5

## 2018-12-02 MED ORDER — DIPHENHYDRAMINE HCL 25 MG PO CAPS
50.0000 mg | ORAL_CAPSULE | Freq: Once | ORAL | Status: AC
  Administered 2018-12-02: 50 mg via ORAL

## 2018-12-02 MED ORDER — SODIUM CHLORIDE 0.9% FLUSH
10.0000 mL | INTRAVENOUS | Status: DC | PRN
  Administered 2018-12-02: 10 mL
  Filled 2018-12-02: qty 10

## 2018-12-02 MED ORDER — HEPARIN SOD (PORK) LOCK FLUSH 100 UNIT/ML IV SOLN
500.0000 [IU] | Freq: Once | INTRAVENOUS | Status: AC | PRN
  Administered 2018-12-02: 500 [IU]
  Filled 2018-12-02: qty 5

## 2018-12-02 MED ORDER — SODIUM CHLORIDE 0.9 % IV SOLN
425.0000 mg | Freq: Once | INTRAVENOUS | Status: AC
  Administered 2018-12-02: 430 mg via INTRAVENOUS
  Filled 2018-12-02: qty 43

## 2018-12-02 MED ORDER — ACETAMINOPHEN 325 MG PO TABS
ORAL_TABLET | ORAL | Status: AC
  Filled 2018-12-02: qty 2

## 2018-12-02 MED ORDER — TRASTUZUMAB CHEMO 150 MG IV SOLR
450.0000 mg | Freq: Once | INTRAVENOUS | Status: AC
  Administered 2018-12-02: 10:00:00 450 mg via INTRAVENOUS
  Filled 2018-12-02: qty 21.43

## 2018-12-02 NOTE — Patient Instructions (Signed)
[facility] Discharge Instructions for Patients Receiving Chemotherapy  Today you received the following chemotherapy agents:  Herceptin, Taxotere, Carboplatin  To help prevent nausea and vomiting after your treatment, we encourage you to take your nausea medication as prescribed.   If you develop nausea and vomiting that is not controlled by your nausea medication, call the clinic.   BELOW ARE SYMPTOMS THAT SHOULD BE REPORTED IMMEDIATELY:  *FEVER GREATER THAN 100.5 F  *CHILLS WITH OR WITHOUT FEVER  NAUSEA AND VOMITING THAT IS NOT CONTROLLED WITH YOUR NAUSEA MEDICATION  *UNUSUAL SHORTNESS OF BREATH  *UNUSUAL BRUISING OR BLEEDING  TENDERNESS IN MOUTH AND THROAT WITH OR WITHOUT PRESENCE OF ULCERS  *URINARY PROBLEMS  *BOWEL PROBLEMS  UNUSUAL RASH Items with * indicate a potential emergency and should be followed up as soon as possible.  Feel free to call the clinic should you have any questions or concerns. The clinic phone number is [phone].  Please show the [facility] at check-in to the Emergency Department and triage nurse.

## 2018-12-03 ENCOUNTER — Telehealth: Payer: Self-pay | Admitting: Hematology and Oncology

## 2018-12-03 NOTE — Telephone Encounter (Signed)
Called regarding schedule °

## 2018-12-04 ENCOUNTER — Other Ambulatory Visit: Payer: Self-pay

## 2018-12-04 ENCOUNTER — Inpatient Hospital Stay: Payer: Medicare Other

## 2018-12-04 VITALS — BP 150/60 | HR 81 | Temp 98.0°F | Resp 18

## 2018-12-04 DIAGNOSIS — C50412 Malignant neoplasm of upper-outer quadrant of left female breast: Secondary | ICD-10-CM

## 2018-12-04 DIAGNOSIS — Z170 Estrogen receptor positive status [ER+]: Principal | ICD-10-CM

## 2018-12-04 DIAGNOSIS — E876 Hypokalemia: Secondary | ICD-10-CM | POA: Diagnosis not present

## 2018-12-04 DIAGNOSIS — Z5112 Encounter for antineoplastic immunotherapy: Secondary | ICD-10-CM | POA: Diagnosis not present

## 2018-12-04 DIAGNOSIS — G620 Drug-induced polyneuropathy: Secondary | ICD-10-CM | POA: Diagnosis not present

## 2018-12-04 DIAGNOSIS — D6481 Anemia due to antineoplastic chemotherapy: Secondary | ICD-10-CM | POA: Diagnosis not present

## 2018-12-04 DIAGNOSIS — Z5111 Encounter for antineoplastic chemotherapy: Secondary | ICD-10-CM | POA: Diagnosis not present

## 2018-12-04 MED ORDER — PEGFILGRASTIM-CBQV 6 MG/0.6ML ~~LOC~~ SOSY
PREFILLED_SYRINGE | SUBCUTANEOUS | Status: AC
  Filled 2018-12-04: qty 0.6

## 2018-12-04 MED ORDER — PEGFILGRASTIM-CBQV 6 MG/0.6ML ~~LOC~~ SOSY
6.0000 mg | PREFILLED_SYRINGE | Freq: Once | SUBCUTANEOUS | Status: AC
  Administered 2018-12-04: 09:00:00 6 mg via SUBCUTANEOUS

## 2018-12-04 NOTE — Patient Instructions (Signed)
Pegfilgrastim injection  What is this medicine?  PEGFILGRASTIM (PEG fil gra stim) is a long-acting granulocyte colony-stimulating factor that stimulates the growth of neutrophils, a type of white blood cell important in the body's fight against infection. It is used to reduce the incidence of fever and infection in patients with certain types of cancer who are receiving chemotherapy that affects the bone marrow, and to increase survival after being exposed to high doses of radiation.  This medicine may be used for other purposes; ask your health care provider or pharmacist if you have questions.  COMMON BRAND NAME(S): Fulphila, Neulasta, UDENYCA  What should I tell my health care provider before I take this medicine?  They need to know if you have any of these conditions:  -kidney disease  -latex allergy  -ongoing radiation therapy  -sickle cell disease  -skin reactions to acrylic adhesives (On-Body Injector only)  -an unusual or allergic reaction to pegfilgrastim, filgrastim, other medicines, foods, dyes, or preservatives  -pregnant or trying to get pregnant  -breast-feeding  How should I use this medicine?  This medicine is for injection under the skin. If you get this medicine at home, you will be taught how to prepare and give the pre-filled syringe or how to use the On-body Injector. Refer to the patient Instructions for Use for detailed instructions. Use exactly as directed. Tell your healthcare provider immediately if you suspect that the On-body Injector may not have performed as intended or if you suspect the use of the On-body Injector resulted in a missed or partial dose.  It is important that you put your used needles and syringes in a special sharps container. Do not put them in a trash can. If you do not have a sharps container, call your pharmacist or healthcare provider to get one.  Talk to your pediatrician regarding the use of this medicine in children. While this drug may be prescribed for  selected conditions, precautions do apply.  Overdosage: If you think you have taken too much of this medicine contact a poison control center or emergency room at once.  NOTE: This medicine is only for you. Do not share this medicine with others.  What if I miss a dose?  It is important not to miss your dose. Call your doctor or health care professional if you miss your dose. If you miss a dose due to an On-body Injector failure or leakage, a new dose should be administered as soon as possible using a single prefilled syringe for manual use.  What may interact with this medicine?  Interactions have not been studied.  Give your health care provider a list of all the medicines, herbs, non-prescription drugs, or dietary supplements you use. Also tell them if you smoke, drink alcohol, or use illegal drugs. Some items may interact with your medicine.  This list may not describe all possible interactions. Give your health care provider a list of all the medicines, herbs, non-prescription drugs, or dietary supplements you use. Also tell them if you smoke, drink alcohol, or use illegal drugs. Some items may interact with your medicine.  What should I watch for while using this medicine?  You may need blood work done while you are taking this medicine.  If you are going to need a MRI, CT scan, or other procedure, tell your doctor that you are using this medicine (On-Body Injector only).  What side effects may I notice from receiving this medicine?  Side effects that you should report to   your doctor or health care professional as soon as possible:  -allergic reactions like skin rash, itching or hives, swelling of the face, lips, or tongue  -back pain  -dizziness  -fever  -pain, redness, or irritation at site where injected  -pinpoint red spots on the skin  -red or dark-brown urine  -shortness of breath or breathing problems  -stomach or side pain, or pain at the shoulder  -swelling  -tiredness  -trouble passing urine or  change in the amount of urine  Side effects that usually do not require medical attention (report to your doctor or health care professional if they continue or are bothersome):  -bone pain  -muscle pain  This list may not describe all possible side effects. Call your doctor for medical advice about side effects. You may report side effects to FDA at 1-800-FDA-1088.  Where should I keep my medicine?  Keep out of the reach of children.  If you are using this medicine at home, you will be instructed on how to store it. Throw away any unused medicine after the expiration date on the label.  NOTE: This sheet is a summary. It may not cover all possible information. If you have questions about this medicine, talk to your doctor, pharmacist, or health care provider.   2019 Elsevier/Gold Standard (2017-11-17 16:57:08)

## 2018-12-08 ENCOUNTER — Other Ambulatory Visit: Payer: Self-pay | Admitting: *Deleted

## 2018-12-08 DIAGNOSIS — Z170 Estrogen receptor positive status [ER+]: Principal | ICD-10-CM

## 2018-12-08 DIAGNOSIS — C50412 Malignant neoplasm of upper-outer quadrant of left female breast: Secondary | ICD-10-CM

## 2018-12-09 ENCOUNTER — Inpatient Hospital Stay: Payer: Medicare Other

## 2018-12-09 ENCOUNTER — Other Ambulatory Visit: Payer: Medicare Other

## 2018-12-09 ENCOUNTER — Ambulatory Visit: Payer: Medicare Other

## 2018-12-09 ENCOUNTER — Other Ambulatory Visit: Payer: Self-pay

## 2018-12-09 VITALS — BP 143/61 | HR 84 | Temp 98.4°F | Resp 18

## 2018-12-09 DIAGNOSIS — Z5111 Encounter for antineoplastic chemotherapy: Secondary | ICD-10-CM | POA: Diagnosis not present

## 2018-12-09 DIAGNOSIS — C50412 Malignant neoplasm of upper-outer quadrant of left female breast: Secondary | ICD-10-CM | POA: Diagnosis not present

## 2018-12-09 DIAGNOSIS — G620 Drug-induced polyneuropathy: Secondary | ICD-10-CM | POA: Diagnosis not present

## 2018-12-09 DIAGNOSIS — Z5112 Encounter for antineoplastic immunotherapy: Secondary | ICD-10-CM | POA: Diagnosis not present

## 2018-12-09 DIAGNOSIS — Z170 Estrogen receptor positive status [ER+]: Principal | ICD-10-CM

## 2018-12-09 DIAGNOSIS — Z95828 Presence of other vascular implants and grafts: Secondary | ICD-10-CM

## 2018-12-09 DIAGNOSIS — D6481 Anemia due to antineoplastic chemotherapy: Secondary | ICD-10-CM | POA: Diagnosis not present

## 2018-12-09 DIAGNOSIS — E876 Hypokalemia: Secondary | ICD-10-CM | POA: Diagnosis not present

## 2018-12-09 LAB — MAGNESIUM: Magnesium: 1.3 mg/dL — CL (ref 1.7–2.4)

## 2018-12-09 LAB — CMP (CANCER CENTER ONLY)
ALT: 12 U/L (ref 0–44)
AST: 11 U/L — ABNORMAL LOW (ref 15–41)
Albumin: 3.4 g/dL — ABNORMAL LOW (ref 3.5–5.0)
Alkaline Phosphatase: 66 U/L (ref 38–126)
Anion gap: 11 (ref 5–15)
BUN: 11 mg/dL (ref 8–23)
CO2: 21 mmol/L — ABNORMAL LOW (ref 22–32)
Calcium: 8.7 mg/dL — ABNORMAL LOW (ref 8.9–10.3)
Chloride: 106 mmol/L (ref 98–111)
Creatinine: 0.74 mg/dL (ref 0.44–1.00)
GFR, Est AFR Am: >60 mL/min (ref >60)
GFR, Estimated: >60 mL/min (ref >60)
Glucose, Bld: 145 mg/dL — ABNORMAL HIGH (ref 70–99)
Potassium: 3.9 mmol/L (ref 3.5–5.1)
Sodium: 138 mmol/L (ref 135–145)
Total Bilirubin: 0.7 mg/dL (ref 0.3–1.2)
Total Protein: 6.1 g/dL — ABNORMAL LOW (ref 6.5–8.1)

## 2018-12-09 LAB — CBC WITH DIFFERENTIAL (CANCER CENTER ONLY)
Abs Immature Granulocytes: 0.03 10*3/uL (ref 0.00–0.07)
Basophils Absolute: 0 10*3/uL (ref 0.0–0.1)
Basophils Relative: 1 %
Eosinophils Absolute: 0 10*3/uL (ref 0.0–0.5)
Eosinophils Relative: 1 %
HCT: 26.9 % — ABNORMAL LOW (ref 36.0–46.0)
Hemoglobin: 9 g/dL — ABNORMAL LOW (ref 12.0–15.0)
Immature Granulocytes: 1 %
Lymphocytes Relative: 25 %
Lymphs Abs: 0.7 10*3/uL (ref 0.7–4.0)
MCH: 34.1 pg — ABNORMAL HIGH (ref 26.0–34.0)
MCHC: 33.5 g/dL (ref 30.0–36.0)
MCV: 101.9 fL — ABNORMAL HIGH (ref 80.0–100.0)
Monocytes Absolute: 0.7 10*3/uL (ref 0.1–1.0)
Monocytes Relative: 26 %
Neutro Abs: 1.3 10*3/uL — ABNORMAL LOW (ref 1.7–7.7)
Neutrophils Relative %: 46 %
Platelet Count: 45 10*3/uL — ABNORMAL LOW (ref 150–400)
RBC: 2.64 MIL/uL — ABNORMAL LOW (ref 3.87–5.11)
RDW: 18.1 % — ABNORMAL HIGH (ref 11.5–15.5)
WBC Count: 2.8 10*3/uL — ABNORMAL LOW (ref 4.0–10.5)
WBC Morphology: INCREASED
nRBC: 0 % (ref 0.0–0.2)

## 2018-12-09 MED ORDER — SODIUM CHLORIDE 0.9 % IV SOLN
Freq: Once | INTRAVENOUS | Status: AC
  Administered 2018-12-09: 08:00:00 via INTRAVENOUS
  Filled 2018-12-09: qty 250

## 2018-12-09 MED ORDER — HEPARIN SOD (PORK) LOCK FLUSH 100 UNIT/ML IV SOLN
500.0000 [IU] | Freq: Once | INTRAVENOUS | Status: AC
  Administered 2018-12-09: 11:00:00 500 [IU]
  Filled 2018-12-09: qty 5

## 2018-12-09 MED ORDER — MAGNESIUM SULFATE 2 GM/50ML IV SOLN
2.0000 g | Freq: Once | INTRAVENOUS | Status: AC
  Administered 2018-12-09: 2 g via INTRAVENOUS
  Filled 2018-12-09: qty 50

## 2018-12-09 MED ORDER — SODIUM CHLORIDE 0.9% FLUSH
10.0000 mL | Freq: Once | INTRAVENOUS | Status: AC
  Administered 2018-12-09: 10 mL
  Filled 2018-12-09: qty 10

## 2018-12-09 MED ORDER — SODIUM CHLORIDE 0.9 % IV SOLN: 2.0000 g | Freq: Once | INTRAVENOUS | Status: DC

## 2018-12-09 NOTE — Patient Instructions (Signed)
Hypomagnesemia Hypomagnesemia is a condition in which the level of magnesium in the blood is low. Magnesium is a mineral that is found in many foods. It is used in many different processes in the body. Hypomagnesemia can affect every organ in the body. In severe cases, it can cause life-threatening problems. What are the causes? This condition may be caused by:  Not getting enough magnesium in your diet.  Malnutrition.  Problems with absorbing magnesium from the intestines.  Dehydration.  Alcohol abuse.  Vomiting.  Severe or chronic diarrhea.  Some medicines, including medicines that make you urinate more (diuretics).  Certain diseases, such as kidney disease, diabetes, celiac disease, and overactive thyroid. What are the signs or symptoms? Symptoms of this condition include:  Loss of appetite.  Nausea and vomiting.  Involuntary shaking or trembling of a body part (tremor).  Muscle weakness.  Tingling in the arms and legs.  Sudden tightening of muscles (muscle spasms).  Confusion.  Psychiatric issues, such as depression, irritability, or psychosis.  A feeling of fluttering of the heart.  Seizures. These symptoms are more severe if magnesium levels drop suddenly. How is this diagnosed? This condition may be diagnosed based on:  Your symptoms and medical history.  A physical exam.  Blood and urine tests. How is this treated? Treatment depends on the cause and the severity of the condition. It may be treated with:  A magnesium supplement. This can be taken in pill form. If the condition is severe, magnesium is usually given through an IV.  Changes to your diet. You may be directed to eat foods that have a lot of magnesium, such as green leafy vegetables, peas, beans, and nuts.  Stopping any intake of alcohol. Follow these instructions at home:      Make sure that your diet includes foods with magnesium. Foods that have a lot of magnesium in them  include: ? Green leafy vegetables, such as spinach and broccoli. ? Beans and peas. ? Nuts and seeds, such as almonds and sunflower seeds. ? Whole grains, such as whole grain bread and fortified cereals.  Take magnesium supplements if your health care provider tells you to do that. Take them as directed.  Take over-the-counter and prescription medicines only as told by your health care provider.  Have your magnesium levels monitored as told by your health care provider.  When you are active, drink fluids that contain electrolytes.  Avoid drinking alcohol.  Keep all follow-up visits as told by your health care provider. This is important. Contact a health care provider if:  You get worse instead of better.  Your symptoms return. Get help right away if you:  Develop severe muscle weakness.  Have trouble breathing.  Feel that your heart is racing. Summary  Hypomagnesemia is a condition in which the level of magnesium in the blood is low.  Hypomagnesemia can affect every organ in the body.  Treatment may include eating more foods that contain magnesium, taking magnesium supplements, and not drinking alcohol.  Have your magnesium levels monitored as told by your health care provider. This information is not intended to replace advice given to you by your health care provider. Make sure you discuss any questions you have with your health care provider. Document Released: 05/08/2005 Document Revised: 07/14/2017 Document Reviewed: 07/14/2017 Elsevier Interactive Patient Education  2019 [place] (COVID-19) Are you at risk?  Are you at risk for the Coronavirus (COVID-19)?  To be considered HIGH RISK for Coronavirus (  COVID-19), you have to meet the following criteria:  . Traveled to [place], [place], [place], [place] or [place]; or in the [place] to [place], [place], [place], or [place]; and have fever, cough, and shortness of breath within the last  2 weeks of travel OR . Been in close contact with a person diagnosed with COVID-19 within the last 2 weeks and have fever, cough, and shortness of breath . IF YOU DO NOT MEET THESE CRITERIA, YOU ARE CONSIDERED LOW RISK FOR COVID-19.  What to do if you are HIGH RISK for COVID-19?  [name] If you are having a medical emergency, call 911. . Seek medical care right away. Before you go to a doctor's office, urgent care or emergency department, call ahead and tell them about your recent travel, contact with someone diagnosed with COVID-19, and your symptoms. You should receive instructions from your physician's office regarding next steps of care.  . When you arrive at healthcare provider, tell the healthcare staff immediately you have returned from visiting [place], [place], [place], [place] or [place]; or traveled in the [place] to [place], [place], [place], or [place]; in the last two weeks or you have been in close contact with a person diagnosed with COVID-19 in the last 2 weeks.   . Tell the health care staff about your symptoms: fever, cough and shortness of breath. . After you have been seen by a medical provider, you will be either: o Tested for (COVID-19) and discharged home on quarantine except to seek medical care if symptoms worsen, and asked to  - Stay home and avoid contact with others until you get your results (4-5 days)  - Avoid travel on public transportation if possible (such as bus, train, or airplane) or o Sent to the Emergency Department by EMS for evaluation, COVID-19 testing, and possible admission depending on your condition and test results.  What to do if you are LOW RISK for COVID-19?  Reduce your risk of any infection by using the same precautions used for avoiding the common cold or flu:  [name] Wash your hands often with soap and warm water for at least 20 seconds.  If soap and water are not readily available, use an alcohol-based hand sanitizer with at least 60%  alcohol.  . If coughing or sneezing, cover your mouth and nose by coughing or sneezing into the elbow areas of your shirt or coat, into a tissue or into your sleeve (not your hands). . Avoid shaking hands with others and consider head nods or verbal greetings only. . Avoid touching your eyes, nose, or mouth with unwashed hands.  . Avoid close contact with people who are sick. . Avoid places or events with large numbers of people in one location, like concerts or sporting events. . Carefully consider travel plans you have or are making. . If you are planning any travel outside or inside the [place], visit the CDC's Travelers' Health webpage for the latest health notices. . If you have some symptoms but not all symptoms, continue to monitor at home and seek medical attention if your symptoms worsen. . If you are having a medical emergency, call 911.   [place] / e-Visit: [email or web address]         [facility]: [facility]: [phone]                   [facility]: [phone]

## 2018-12-09 NOTE — Progress Notes (Signed)
Pt mag 1.3.  Per Dr. Lindi Adie, pt to receive 2 grams of mag IV today

## 2018-12-10 ENCOUNTER — Other Ambulatory Visit: Payer: Self-pay

## 2018-12-10 ENCOUNTER — Telehealth: Payer: Self-pay

## 2018-12-10 NOTE — Telephone Encounter (Signed)
Neulasta Onpro Patient Outreach Note  Patient was contacted on 12/10/2018 in regards to switching G-CSF therapy to Neulasta Onpro ([email or web address]). Patient was educated on the purpose of this proposed change in therapy due to COVID-19 pandemic. Patient was educated about Neulasta Onpro on-body injector and patient will be provided with an educational video while in infusion on their next scheduled date if changed to Neulasta Onpro.   [x]  Patient agrees to change in therapy. Begin process to change to Neulasta Onpro therapy.  []  Patient does not agree to change in therapy. No change to Neulasta Onpro at this time.    Thank Joanne Chars  12/10/2018 2:06 PM

## 2018-12-15 NOTE — Progress Notes (Signed)
per 12/10/18 phone note, pt agreeable to change to ONpro during COVID.   Orders changed to Onpro. PA dept notified to begin process for PA. Scheduling notified to cancel inj appts for 12/25/18 & 01/15/19. Kennith Center, Pharm.D., CPP 12/15/2018@5 :55 PM

## 2018-12-15 NOTE — Addendum Note (Signed)
Addended by: Tora Kindred on: 12/15/2018 05:55 PM   Modules accepted: Orders

## 2018-12-16 NOTE — Assessment & Plan Note (Signed)
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 Neoadjuvant chemotherapy with TCH Perjeta 6 cycles followed by Herceptin maintenance for 1 year 2. Followed by mastectomy with sentinel lymph node study 3. Followed by adjuvant radiation therapy --------------------------------------------------------------- Current treatment: Cycle5day1neoadjuvant TCH (Perjeta discontinued after cycle 1) Chemo toxicities: 1.Severe diarrhea: controlled with Questran however she still gets diarrhea for couple of days after each chemo.  Because of this I will set her up for IV fluids 1 week after each treatment. 2.Fatigue: improved since blood transfusion 3.Leukopenia:Taxotere and Carbo were dose reduced with cycle 2.  ANC today is 2.2.  Okay to receive chemotherapy. 4.Nausea and vomiting:Much improved, only experienced once, and resolved with anti emetics. 5. Anemia: Received 2 units of PRBCs , hemoglobin is slowly starting to creep downwards.  Monitoring closely. 6. Peripheral neuropathy: mild and intermittent, will monitor for now 7. Hypokalemia:Monitoring closely, awaiting today's CMP. 8. H/o diverticulitis: She knows to call us if she develops symptoms.  Labs have been reviewed Return to clinic in3weeks for cycle6

## 2018-12-22 ENCOUNTER — Ambulatory Visit: Payer: Medicare Other | Admitting: Hematology and Oncology

## 2018-12-22 ENCOUNTER — Other Ambulatory Visit: Payer: Medicare Other

## 2018-12-22 NOTE — Progress Notes (Signed)
Patient Care Team: [name], MD as PCP - General (Family Medicine) Birder [name], MD as Referring Physician (Ophthalmology) [name], MD as Consulting Physician (Dermatology) [name], MD as Consulting Physician (Orthopedic Surgery) [name], MD as Consulting Physician (General Surgery) [name], MD as Consulting Physician (Dentistry) [name], MD as Consulting Physician (General Surgery) [name], MD as Consulting Physician (Hematology and Oncology) [name], MD as Attending Physician (Radiation Oncology)  DIAGNOSIS:    ICD-10-CM   1. Malignant neoplasm of upper-outer quadrant of left breast in female, estrogen receptor positive ([place]) C50.412    Z17.0     SUMMARY OF ONCOLOGIC HISTORY:   Malignant neoplasm of upper-outer quadrant of left breast in female, estrogen receptor positive ([place])   09/14/2018 Initial Diagnosis    With prior history of breast implants that were ruptured/replaced, patient had cellulitis LIQ left breast for 1 week, and UOQ asymmetry 1.5 cm, MRI right breast biopsy benign, left breast numerous masses 7 cm skin thickening: 2 biopsies from left breast: Grade 2-3 IDC ER PR positive, HER-2 positive, Ki-67 50%    09/16/2018 Cancer Staging    Staging form: Breast, AJCC 8th Edition - Clinical stage from 09/16/2018: Stage IIIB (cT4, cN0, cM0, G3, ER+, PR+, HER2+) - Signed by [name], MD on 09/16/2018    09/29/2018 -  Neo-Adjuvant Chemotherapy    TCHP q 3 weeks     CHIEF COMPLIANT: Cycle 5 TCH  INTERVAL HISTORY: Katie [name] is a 73 y.o. with above-mentioned history of left breast cancer currently receiving neoadjuvant chemotherapy with TCHP.She presents to the clinic today for cycle 5.  She continues to have intermittent problems with diarrhea.  She is using Questran Lomotil and Imodium.  She also has mild peripheral neuropathy.  Profound fatigue.  Denies any nausea or vomiting any further.  Denies any  fevers or chills.  REVIEW OF SYSTEMS:   Constitutional: Denies fevers, chills or abnormal weight loss Eyes: Denies blurriness of vision Ears, nose, mouth, throat, and face: Denies mucositis or sore throat Respiratory: Denies cough, dyspnea or wheezes Cardiovascular: Denies palpitation, chest discomfort Gastrointestinal: Denies nausea, heartburn or change in bowel habits Skin: Denies abnormal skin rashes Lymphatics: Denies new lymphadenopathy or easy bruising Neurological: Denies numbness, tingling or new weaknesses Behavioral/Psych: Mood is stable, no new changes  Extremities: No lower extremity edema Breast: denies any pain or lumps or nodules in either breasts All other systems were reviewed with the patient and are negative.  I have reviewed the past medical history, past surgical history, social history and family history with the patient and they are unchanged from previous note.  ALLERGIES:  is allergic to erythromycin; ciprofloxacin; daypro [oxaprozin]; enalapril maleate; nsaids; and vioxx [rofecoxib].  MEDICATIONS:  Current Outpatient Medications  Medication Sig Dispense Refill  . acetaminophen (TYLENOL) 325 MG tablet Take 650 mg by mouth every 6 (six) hours as needed for mild pain or headache.    [name] amLODipine (NORVASC) 5 MG tablet Take 1 tablet (5 mg total) by mouth daily. 30 tablet 11  . atorvastatin (LIPITOR) 10 MG tablet TAKE 1 TABLET(10 MG) BY MOUTH DAILY (Patient taking differently: Take 10 mg by mouth daily at 6 PM. ) 90 tablet 0  . Azelastine HCl 137 MCG/SPRAY SOLN instill 1 spray into each nostril twice a day for 1 week then if needed 30 mL 5  . cholecalciferol (VITAMIN D) 400 units TABS tablet Take 400 Units by mouth 2 (two) times daily.    [name]  cholestyramine (QUESTRAN) 4 g packet Take 1 packet (4 g total) by mouth 3 (three) times daily with meals. 60 each 12  . clobetasol (TEMOVATE) 0.05 % external solution Apply 1 application topically 2 (two) times daily as needed  (scalp). Reported on 08/16/2015    . diphenoxylate-atropine (LOMOTIL) 2.5-0.025 MG tablet     . fluconazole (DIFLUCAN) 150 MG tablet Take 1 tablet for a yeast infection if needed, may repeat in 3 days if needed 2 tablet 0  . fluticasone (FLONASE) 50 MCG/ACT nasal spray INSTILL 2 SPRAYS INTO EACH NOSTRIL ONCE DAILY 16 g 5  . glucose blood test strip USE TO CHECK BLOOD SUGAR DAILY 50 each 12  . lidocaine-prilocaine (EMLA) cream Apply to affected area once (Patient taking differently: Apply 1 application topically as needed (port access). ) 30 g 3  . loperamide (IMODIUM) 2 MG capsule Take 2 mg by mouth as needed for diarrhea or loose stools.    [name] loratadine (CLARITIN) 10 MG tablet Take 10 mg by mouth daily.    [name] LORazepam (ATIVAN) 0.5 MG tablet TAKE 1 TABLET(0.5 MG) BY MOUTH AT BEDTIME AS NEEDED FOR SLEEP 30 tablet 0  . metFORMIN (GLUCOPHAGE-XR) 500 MG 24 hr tablet Take 1 tablet (500 mg total) by mouth daily with breakfast. (Patient taking differently: Take 500 mg by mouth at bedtime. ) 90 tablet 3  . metroNIDAZOLE (METROCREAM) 0.75 % cream Apply 1 application topically 2 (two) times daily as needed (rosacea).     . Multiple Vitamin (MULTIVITAMIN WITH MINERALS) TABS tablet Take 1 tablet by mouth daily.    . Olopatadine HCl 0.2 % SOLN 1 drop per eye daily 2.5 mL 0  . ondansetron (ZOFRAN) 8 MG tablet Take 1 tablet (8 mg total) by mouth 2 (two) times daily as needed (Nausea or vomiting). Begin 4 days after chemotherapy. 30 tablet 1  . prochlorperazine (COMPAZINE) 10 MG tablet Take 1 tablet (10 mg total) by mouth every 6 (six) hours as needed (Nausea or vomiting). 30 tablet 1  . triamcinolone cream (KENALOG) 0.1 % Apply 1 application topically 2 (two) times daily. (Patient taking differently: Apply 1 application topically 2 (two) times daily as needed (skin rashes). ) 15 g 0  . valACYclovir (VALTREX) 500 MG tablet Take 1 tablet (500 mg total) by mouth 2 (two) times daily. (Patient taking differently: Take  500 mg by mouth as needed (flare up). ) 6 tablet 2   No current facility-administered medications for this visit.     PHYSICAL EXAMINATION: ECOG PERFORMANCE STATUS: 1 - Symptomatic but completely ambulatory  There were no vitals filed for this visit. There were no vitals filed for this visit.  GENERAL: alert, no distress and comfortable SKIN: skin color, texture, turgor are normal, no rashes or significant lesions EYES: normal, Conjunctiva are pink and non-injected, sclera clear OROPHARYNX: no exudate, no erythema and lips, buccal mucosa, and tongue normal  NECK: supple, thyroid normal size, non-tender, without nodularity LYMPH: no palpable lymphadenopathy in the cervical, axillary or inguinal LUNGS: clear to auscultation and percussion with normal breathing effort HEART: regular rate & rhythm and no murmurs and no lower extremity edema ABDOMEN: abdomen soft, non-tender and normal bowel sounds MUSCULOSKELETAL: no cyanosis of digits and no clubbing  NEURO: alert & oriented x 3 with fluent speech, no focal motor/sensory deficits EXTREMITIES: No lower extremity edema  LABORATORY DATA:  I have reviewed the data as listed CMP Latest Ref Rng & Units 12/09/2018 12/02/2018 11/19/2018  Glucose 70 - 99 mg/dL  145(H) 103(H) 151(H)  BUN 8 - 23 mg/dL 11 12 9   Creatinine 0.44 - 1.00 mg/dL 0.74 0.76 0.85  Sodium 135 - 145 mmol/L 138 141 137  Potassium 3.5 - 5.1 mmol/L 3.9 3.9 4.0  Chloride 98 - 111 mmol/L 106 109 104  CO2 22 - 32 mmol/L 21(L) 22 23  Calcium 8.9 - 10.3 mg/dL 8.7(L) 9.0 9.3  Total Protein 6.5 - 8.1 g/dL 6.1(L) 6.5 7.0  Total Bilirubin 0.3 - 1.2 mg/dL 0.7 0.5 0.6  Alkaline Phos 38 - 126 U/L 66 72 96  AST 15 - 41 U/L 11(L) 21 14(L)  ALT 0 - 44 U/L 12 24 15     Lab Results  Component Value Date   WBC 2.8 (L) 12/09/2018   HGB 9.0 (L) 12/09/2018   HCT 26.9 (L) 12/09/2018   MCV 101.9 (H) 12/09/2018   PLT 45 (L) 12/09/2018   NEUTROABS 1.3 (L) 12/09/2018    ASSESSMENT & PLAN:   Malignant neoplasm of upper-outer quadrant of left breast in female, estrogen receptor positive ([place]) With prior history of breast implants that were ruptured/replaced, patient had cellulitis LIQ left breast for 1 week, and UOQ asymmetry 1.5 cm, MRI right breast biopsy benign, left breast numerous masses 7 cm skin thickening: 2 biopsies from left breast: Grade 2-3 IDC ER PR positive, HER-2 positive, Ki-67 50% Clinical stage: T4 N0 M0 stage IIIb  Treatment plan 1. Neoadjuvant chemotherapy with TCH Perjeta 6 cycles followed by Herceptin maintenance for 1 year 2. Followed by mastectomy with sentinel lymph node study 3. Followed by adjuvant radiation therapy --------------------------------------------------------------- Current treatment: Cycle5day1neoadjuvant TCH (Perjeta discontinued after cycle 1) Chemo toxicities: 1.Severe diarrhea: controlled with Questran however she still gets diarrhea for couple of days after each chemo.  Because of this I will set her up for IV fluids 1 week after each treatment. 2.Fatigue: improved since blood transfusion 3.Leukopenia:Taxotere and Carbo were dose reduced with cycle 2.  ANC today is 2.2.  Okay to receive chemotherapy. 4.Nausea and vomiting:Much improved, only experienced once, and resolved with anti emetics. 5. Anemia: Received 2 units of PRBCs , hemoglobin is slowly starting to creep downwards.  Monitoring closely. 6. Peripheral neuropathy: mild and intermittent, will monitor for now 7. Hypokalemia:Monitoring closely, awaiting today's CMP. 8. H/o diverticulitis: She knows to call us if she develops symptoms.  Labs have been reviewed Return to clinic in3weeks for cycle6 We will order breast MRI and surgery follow-up after that.  She will presented in the tumor board as well.    No orders of the defined types were placed in this encounter.  The patient has a good understanding of the overall plan. she agrees with it. she  will call with any problems that may develop before the next visit here.  [name], MD 12/23/2018  Julious Oka Dorshimer am acting as scribe for Dr. [name].  I have reviewed the above documentation for accuracy and completeness, and I agree with the above.

## 2018-12-23 ENCOUNTER — Inpatient Hospital Stay: Payer: Medicare Other

## 2018-12-23 ENCOUNTER — Ambulatory Visit: Payer: Medicare Other

## 2018-12-23 ENCOUNTER — Inpatient Hospital Stay (HOSPITAL_BASED_OUTPATIENT_CLINIC_OR_DEPARTMENT_OTHER): Payer: Medicare Other | Admitting: Hematology and Oncology

## 2018-12-23 ENCOUNTER — Other Ambulatory Visit: Payer: Self-pay

## 2018-12-23 DIAGNOSIS — G620 Drug-induced polyneuropathy: Secondary | ICD-10-CM | POA: Diagnosis not present

## 2018-12-23 DIAGNOSIS — R197 Diarrhea, unspecified: Secondary | ICD-10-CM | POA: Diagnosis not present

## 2018-12-23 DIAGNOSIS — Z170 Estrogen receptor positive status [ER+]: Secondary | ICD-10-CM

## 2018-12-23 DIAGNOSIS — Z5112 Encounter for antineoplastic immunotherapy: Secondary | ICD-10-CM | POA: Diagnosis not present

## 2018-12-23 DIAGNOSIS — Z95828 Presence of other vascular implants and grafts: Secondary | ICD-10-CM

## 2018-12-23 DIAGNOSIS — C50412 Malignant neoplasm of upper-outer quadrant of left female breast: Secondary | ICD-10-CM

## 2018-12-23 DIAGNOSIS — D6481 Anemia due to antineoplastic chemotherapy: Secondary | ICD-10-CM

## 2018-12-23 DIAGNOSIS — E876 Hypokalemia: Secondary | ICD-10-CM

## 2018-12-23 DIAGNOSIS — Z5111 Encounter for antineoplastic chemotherapy: Secondary | ICD-10-CM | POA: Diagnosis not present

## 2018-12-23 LAB — CMP (CANCER CENTER ONLY)
ALT: 17 U/L (ref 0–44)
AST: 18 U/L (ref 15–41)
Albumin: 3.7 g/dL (ref 3.5–5.0)
Alkaline Phosphatase: 76 U/L (ref 38–126)
Anion gap: 10 (ref 5–15)
BUN: 17 mg/dL (ref 8–23)
CO2: 24 mmol/L (ref 22–32)
Calcium: 9.5 mg/dL (ref 8.9–10.3)
Chloride: 107 mmol/L (ref 98–111)
Creatinine: 0.77 mg/dL (ref 0.44–1.00)
GFR, Est AFR Am: >60 mL/min (ref >60)
GFR, Estimated: >60 mL/min (ref >60)
Glucose, Bld: 106 mg/dL — ABNORMAL HIGH (ref 70–99)
Potassium: 4 mmol/L (ref 3.5–5.1)
Sodium: 141 mmol/L (ref 135–145)
Total Bilirubin: 0.5 mg/dL (ref 0.3–1.2)
Total Protein: 6.7 g/dL (ref 6.5–8.1)

## 2018-12-23 LAB — CBC WITH DIFFERENTIAL (CANCER CENTER ONLY)
Abs Immature Granulocytes: 0.01 10*3/uL (ref 0.00–0.07)
Basophils Absolute: 0 10*3/uL (ref 0.0–0.1)
Basophils Relative: 0 %
Eosinophils Absolute: 0 10*3/uL (ref 0.0–0.5)
Eosinophils Relative: 0 %
HCT: 28.2 % — ABNORMAL LOW (ref 36.0–46.0)
Hemoglobin: 9.5 g/dL — ABNORMAL LOW (ref 12.0–15.0)
Immature Granulocytes: 0 %
Lymphocytes Relative: 22 %
Lymphs Abs: 1.1 10*3/uL (ref 0.7–4.0)
MCH: 35.6 pg — ABNORMAL HIGH (ref 26.0–34.0)
MCHC: 33.7 g/dL (ref 30.0–36.0)
MCV: 105.6 fL — ABNORMAL HIGH (ref 80.0–100.0)
Monocytes Absolute: 0.5 10*3/uL (ref 0.1–1.0)
Monocytes Relative: 11 %
Neutro Abs: 3.2 10*3/uL (ref 1.7–7.7)
Neutrophils Relative %: 67 %
Platelet Count: 120 10*3/uL — ABNORMAL LOW (ref 150–400)
RBC: 2.67 MIL/uL — ABNORMAL LOW (ref 3.87–5.11)
RDW: 21.2 % — ABNORMAL HIGH (ref 11.5–15.5)
WBC Count: 4.9 10*3/uL (ref 4.0–10.5)
nRBC: 0 % (ref 0.0–0.2)

## 2018-12-23 MED ORDER — SODIUM CHLORIDE 0.9% FLUSH
10.0000 mL | INTRAVENOUS | Status: DC | PRN
  Administered 2018-12-23: 10 mL
  Filled 2018-12-23: qty 10

## 2018-12-23 MED ORDER — SODIUM CHLORIDE 0.9 % IV SOLN
Freq: Once | INTRAVENOUS | Status: AC
  Administered 2018-12-23: 11:00:00 via INTRAVENOUS
  Filled 2018-12-23: qty 5

## 2018-12-23 MED ORDER — DIPHENHYDRAMINE HCL 25 MG PO CAPS
50.0000 mg | ORAL_CAPSULE | Freq: Once | ORAL | Status: AC
  Administered 2018-12-23: 10:00:00 50 mg via ORAL

## 2018-12-23 MED ORDER — DIPHENHYDRAMINE HCL 25 MG PO CAPS
ORAL_CAPSULE | ORAL | Status: AC
  Filled 2018-12-23: qty 2

## 2018-12-23 MED ORDER — PALONOSETRON HCL INJECTION 0.25 MG/5ML
INTRAVENOUS | Status: AC
  Filled 2018-12-23: qty 5

## 2018-12-23 MED ORDER — ACETAMINOPHEN 325 MG PO TABS
650.0000 mg | ORAL_TABLET | Freq: Once | ORAL | Status: AC
  Administered 2018-12-23: 650 mg via ORAL

## 2018-12-23 MED ORDER — PALONOSETRON HCL INJECTION 0.25 MG/5ML
0.2500 mg | Freq: Once | INTRAVENOUS | Status: AC
  Administered 2018-12-23: 0.25 mg via INTRAVENOUS

## 2018-12-23 MED ORDER — ACETAMINOPHEN 325 MG PO TABS
ORAL_TABLET | ORAL | Status: AC
  Filled 2018-12-23: qty 2

## 2018-12-23 MED ORDER — SODIUM CHLORIDE 0.9 % IV SOLN
Freq: Once | INTRAVENOUS | Status: AC
  Administered 2018-12-23: 11:00:00 via INTRAVENOUS
  Filled 2018-12-23: qty 250

## 2018-12-23 MED ORDER — TRASTUZUMAB CHEMO 150 MG IV SOLR
450.0000 mg | Freq: Once | INTRAVENOUS | Status: AC
  Administered 2018-12-23: 450 mg via INTRAVENOUS
  Filled 2018-12-23: qty 21.43

## 2018-12-23 MED ORDER — HEPARIN SOD (PORK) LOCK FLUSH 100 UNIT/ML IV SOLN
500.0000 [IU] | Freq: Once | INTRAVENOUS | Status: AC | PRN
  Administered 2018-12-23: 14:00:00 500 [IU]
  Filled 2018-12-23: qty 5

## 2018-12-23 MED ORDER — SODIUM CHLORIDE 0.9 % IV SOLN
420.5000 mg | Freq: Once | INTRAVENOUS | Status: AC
  Administered 2018-12-23: 13:00:00 420 mg via INTRAVENOUS
  Filled 2018-12-23: qty 42

## 2018-12-23 MED ORDER — MAGNESIUM OXIDE 400 (241.3 MG) MG PO TABS: 400.0000 mg | ORAL_TABLET | Freq: Every day | ORAL | 0 refills | Status: DC

## 2018-12-23 MED ORDER — SODIUM CHLORIDE 0.9 % IV SOLN
60.0000 mg/m2 | Freq: Once | INTRAVENOUS | Status: AC
  Administered 2018-12-23: 12:00:00 110 mg via INTRAVENOUS
  Filled 2018-12-23: qty 11

## 2018-12-23 MED ORDER — SODIUM CHLORIDE 0.9% FLUSH
10.0000 mL | Freq: Once | INTRAVENOUS | Status: AC
  Administered 2018-12-23: 10:00:00 10 mL
  Filled 2018-12-23: qty 10

## 2018-12-23 NOTE — Patient Instructions (Signed)
[facility] Discharge Instructions for Patients Receiving Chemotherapy  Today you received the following chemotherapy agents: Herceptin, Docetaxel, Carboplatin.   To help prevent nausea and vomiting after your treatment, we encourage you to take your nausea medication as directed.    If you develop nausea and vomiting that is not controlled by your nausea medication, call the clinic.   BELOW ARE SYMPTOMS THAT SHOULD BE REPORTED IMMEDIATELY:  *FEVER GREATER THAN 100.5 F  *CHILLS WITH OR WITHOUT FEVER  NAUSEA AND VOMITING THAT IS NOT CONTROLLED WITH YOUR NAUSEA MEDICATION  *UNUSUAL SHORTNESS OF BREATH  *UNUSUAL BRUISING OR BLEEDING  TENDERNESS IN MOUTH AND THROAT WITH OR WITHOUT PRESENCE OF ULCERS  *URINARY PROBLEMS  *BOWEL PROBLEMS  UNUSUAL RASH Items with * indicate a potential emergency and should be followed up as soon as possible.  Feel free to call the clinic should you have any questions or concerns. The clinic phone number is [phone].  Please show the Katie [name] at check-in to the Emergency Department and triage nurse.

## 2018-12-25 ENCOUNTER — Inpatient Hospital Stay: Payer: Medicare Other | Attending: Hematology and Oncology

## 2018-12-25 ENCOUNTER — Other Ambulatory Visit: Payer: Self-pay

## 2018-12-25 ENCOUNTER — Ambulatory Visit: Payer: Medicare Other

## 2018-12-25 VITALS — BP 148/60 | HR 75 | Temp 97.6°F | Resp 18

## 2018-12-25 DIAGNOSIS — Z170 Estrogen receptor positive status [ER+]: Secondary | ICD-10-CM | POA: Diagnosis not present

## 2018-12-25 DIAGNOSIS — E876 Hypokalemia: Secondary | ICD-10-CM | POA: Insufficient documentation

## 2018-12-25 DIAGNOSIS — Z79899 Other long term (current) drug therapy: Secondary | ICD-10-CM | POA: Insufficient documentation

## 2018-12-25 DIAGNOSIS — C50412 Malignant neoplasm of upper-outer quadrant of left female breast: Secondary | ICD-10-CM | POA: Diagnosis not present

## 2018-12-25 DIAGNOSIS — G620 Drug-induced polyneuropathy: Secondary | ICD-10-CM | POA: Insufficient documentation

## 2018-12-25 DIAGNOSIS — D6481 Anemia due to antineoplastic chemotherapy: Secondary | ICD-10-CM | POA: Insufficient documentation

## 2018-12-25 MED ORDER — PEGFILGRASTIM-CBQV 6 MG/0.6ML ~~LOC~~ SOSY
6.0000 mg | PREFILLED_SYRINGE | Freq: Once | SUBCUTANEOUS | Status: AC
  Administered 2018-12-25: 6 mg via SUBCUTANEOUS

## 2018-12-25 MED ORDER — PEGFILGRASTIM-CBQV 6 MG/0.6ML ~~LOC~~ SOSY
PREFILLED_SYRINGE | SUBCUTANEOUS | Status: AC
  Filled 2018-12-25: qty 0.6

## 2018-12-25 NOTE — Patient Instructions (Signed)
Pegfilgrastim injection  What is this medicine?  PEGFILGRASTIM (PEG fil gra stim) is a long-acting granulocyte colony-stimulating factor that stimulates the growth of neutrophils, a type of white blood cell important in the body's fight against infection. It is used to reduce the incidence of fever and infection in patients with certain types of cancer who are receiving chemotherapy that affects the bone marrow, and to increase survival after being exposed to high doses of radiation.  This medicine may be used for other purposes; ask your health care provider or pharmacist if you have questions.  COMMON BRAND NAME(S): Fulphila, Neulasta, UDENYCA  What should I tell my health care provider before I take this medicine?  They need to know if you have any of these conditions:  -kidney disease  -latex allergy  -ongoing radiation therapy  -sickle cell disease  -skin reactions to acrylic adhesives (On-Body Injector only)  -an unusual or allergic reaction to pegfilgrastim, filgrastim, other medicines, foods, dyes, or preservatives  -pregnant or trying to get pregnant  -breast-feeding  How should I use this medicine?  This medicine is for injection under the skin. If you get this medicine at home, you will be taught how to prepare and give the pre-filled syringe or how to use the On-body Injector. Refer to the patient Instructions for Use for detailed instructions. Use exactly as directed. Tell your healthcare provider immediately if you suspect that the On-body Injector may not have performed as intended or if you suspect the use of the On-body Injector resulted in a missed or partial dose.  It is important that you put your used needles and syringes in a special sharps container. Do not put them in a trash can. If you do not have a sharps container, call your pharmacist or healthcare provider to get one.  Talk to your pediatrician regarding the use of this medicine in children. While this drug may be prescribed for  selected conditions, precautions do apply.  Overdosage: If you think you have taken too much of this medicine contact a poison control center or emergency room at once.  NOTE: This medicine is only for you. Do not share this medicine with others.  What if I miss a dose?  It is important not to miss your dose. Call your doctor or health care professional if you miss your dose. If you miss a dose due to an On-body Injector failure or leakage, a new dose should be administered as soon as possible using a single prefilled syringe for manual use.  What may interact with this medicine?  Interactions have not been studied.  Give your health care provider a list of all the medicines, herbs, non-prescription drugs, or dietary supplements you use. Also tell them if you smoke, drink alcohol, or use illegal drugs. Some items may interact with your medicine.  This list may not describe all possible interactions. Give your health care provider a list of all the medicines, herbs, non-prescription drugs, or dietary supplements you use. Also tell them if you smoke, drink alcohol, or use illegal drugs. Some items may interact with your medicine.  What should I watch for while using this medicine?  You may need blood work done while you are taking this medicine.  If you are going to need a MRI, CT scan, or other procedure, tell your doctor that you are using this medicine (On-Body Injector only).  What side effects may I notice from receiving this medicine?  Side effects that you should report to   your doctor or health care professional as soon as possible:  -allergic reactions like skin rash, itching or hives, swelling of the face, lips, or tongue  -back pain  -dizziness  -fever  -pain, redness, or irritation at site where injected  -pinpoint red spots on the skin  -red or dark-brown urine  -shortness of breath or breathing problems  -stomach or side pain, or pain at the shoulder  -swelling  -tiredness  -trouble passing urine or  change in the amount of urine  Side effects that usually do not require medical attention (report to your doctor or health care professional if they continue or are bothersome):  -bone pain  -muscle pain  This list may not describe all possible side effects. Call your doctor for medical advice about side effects. You may report side effects to FDA at 1-800-FDA-1088.  Where should I keep my medicine?  Keep out of the reach of children.  If you are using this medicine at home, you will be instructed on how to store it. Throw away any unused medicine after the expiration date on the label.  NOTE: This sheet is a summary. It may not cover all possible information. If you have questions about this medicine, talk to your doctor, pharmacist, or health care provider.   2019 Elsevier/Gold Standard (2017-11-17 16:57:08)

## 2018-12-28 ENCOUNTER — Telehealth: Payer: Self-pay | Admitting: *Deleted

## 2018-12-28 ENCOUNTER — Other Ambulatory Visit: Payer: Self-pay | Admitting: *Deleted

## 2018-12-28 ENCOUNTER — Telehealth: Payer: Self-pay | Admitting: Hematology and Oncology

## 2018-12-28 NOTE — Telephone Encounter (Signed)
Scheduled appt per 5/04 sch message - pt aware of apt date and time

## 2018-12-28 NOTE — Telephone Encounter (Signed)
Received call from pt stating that since starting her oral magnesium supplement, she has experienced severe gastritis with stomach pains and severe acid reflux.  Pt states she has stopped taking the supplement and does not want to continue taking it.  Pt requesting to have a mag level drawn prior to her fluid infusion apt on 12/30/2018 and wants to go back to receiving Mag through her IV.  High priority message sent to scheduling to schedule pt to have lab and flush apt on 5/6

## 2018-12-30 ENCOUNTER — Inpatient Hospital Stay: Payer: Medicare Other

## 2018-12-30 ENCOUNTER — Other Ambulatory Visit: Payer: Medicare Other

## 2018-12-30 ENCOUNTER — Other Ambulatory Visit: Payer: Self-pay

## 2018-12-30 ENCOUNTER — Ambulatory Visit: Payer: Medicare Other | Admitting: Hematology and Oncology

## 2018-12-30 DIAGNOSIS — Z170 Estrogen receptor positive status [ER+]: Principal | ICD-10-CM

## 2018-12-30 DIAGNOSIS — D6481 Anemia due to antineoplastic chemotherapy: Secondary | ICD-10-CM | POA: Diagnosis not present

## 2018-12-30 DIAGNOSIS — Z95828 Presence of other vascular implants and grafts: Secondary | ICD-10-CM

## 2018-12-30 DIAGNOSIS — Z79899 Other long term (current) drug therapy: Secondary | ICD-10-CM | POA: Diagnosis not present

## 2018-12-30 DIAGNOSIS — C50412 Malignant neoplasm of upper-outer quadrant of left female breast: Secondary | ICD-10-CM | POA: Diagnosis not present

## 2018-12-30 DIAGNOSIS — E876 Hypokalemia: Secondary | ICD-10-CM | POA: Diagnosis not present

## 2018-12-30 LAB — CBC WITH DIFFERENTIAL (CANCER CENTER ONLY)
Abs Immature Granulocytes: 0 10*3/uL (ref 0.00–0.07)
Basophils Absolute: 0 10*3/uL (ref 0.0–0.1)
Basophils Relative: 1 %
Eosinophils Absolute: 0 10*3/uL (ref 0.0–0.5)
Eosinophils Relative: 0 %
HCT: 23.8 % — ABNORMAL LOW (ref 36.0–46.0)
Hemoglobin: 8.2 g/dL — ABNORMAL LOW (ref 12.0–15.0)
Immature Granulocytes: 0 %
Lymphocytes Relative: 31 %
Lymphs Abs: 0.7 10*3/uL (ref 0.7–4.0)
MCH: 36.8 pg — ABNORMAL HIGH (ref 26.0–34.0)
MCHC: 34.5 g/dL (ref 30.0–36.0)
MCV: 106.7 fL — ABNORMAL HIGH (ref 80.0–100.0)
Monocytes Absolute: 0.6 10*3/uL (ref 0.1–1.0)
Monocytes Relative: 26 %
Neutro Abs: 1 10*3/uL — ABNORMAL LOW (ref 1.7–7.7)
Neutrophils Relative %: 42 %
Platelet Count: 50 10*3/uL — ABNORMAL LOW (ref 150–400)
RBC: 2.23 MIL/uL — ABNORMAL LOW (ref 3.87–5.11)
RDW: 19.1 % — ABNORMAL HIGH (ref 11.5–15.5)
WBC Count: 2.3 10*3/uL — ABNORMAL LOW (ref 4.0–10.5)
nRBC: 0 % (ref 0.0–0.2)

## 2018-12-30 LAB — CMP (CANCER CENTER ONLY)
ALT: 11 U/L (ref 0–44)
AST: 13 U/L — ABNORMAL LOW (ref 15–41)
Albumin: 3.5 g/dL (ref 3.5–5.0)
Alkaline Phosphatase: 73 U/L (ref 38–126)
Anion gap: 10 (ref 5–15)
BUN: 12 mg/dL (ref 8–23)
CO2: 23 mmol/L (ref 22–32)
Calcium: 8.6 mg/dL — ABNORMAL LOW (ref 8.9–10.3)
Chloride: 106 mmol/L (ref 98–111)
Creatinine: 0.78 mg/dL (ref 0.44–1.00)
GFR, Est AFR Am: >60 mL/min (ref >60)
GFR, Estimated: >60 mL/min (ref >60)
Glucose, Bld: 175 mg/dL — ABNORMAL HIGH (ref 70–99)
Potassium: 3.5 mmol/L (ref 3.5–5.1)
Sodium: 139 mmol/L (ref 135–145)
Total Bilirubin: 0.7 mg/dL (ref 0.3–1.2)
Total Protein: 6.1 g/dL — ABNORMAL LOW (ref 6.5–8.1)

## 2018-12-30 LAB — MAGNESIUM: Magnesium: 1.2 mg/dL — CL (ref 1.7–2.4)

## 2018-12-30 MED ORDER — SODIUM CHLORIDE 0.9% FLUSH
10.0000 mL | Freq: Once | INTRAVENOUS | Status: AC
  Administered 2018-12-30: 10 mL via INTRAVENOUS
  Filled 2018-12-30: qty 10

## 2018-12-30 MED ORDER — SODIUM CHLORIDE 0.9 % IV SOLN
2.0000 g | Freq: Once | INTRAVENOUS | Status: AC
  Administered 2018-12-30: 2 g via INTRAVENOUS
  Filled 2018-12-30: qty 4

## 2018-12-30 MED ORDER — SODIUM CHLORIDE 0.9% FLUSH
10.0000 mL | Freq: Once | INTRAVENOUS | Status: AC
  Administered 2018-12-30: 10 mL
  Filled 2018-12-30: qty 10

## 2018-12-30 MED ORDER — HEPARIN SOD (PORK) LOCK FLUSH 100 UNIT/ML IV SOLN
500.0000 [IU] | Freq: Once | INTRAVENOUS | Status: AC
  Administered 2018-12-30: 500 [IU] via INTRAVENOUS
  Filled 2018-12-30: qty 5

## 2018-12-30 NOTE — Progress Notes (Signed)
Pt mag 1.2.  Per Dr. Lindi Adie, pt to receive 2 grams of mag mixed in 1L NS today.

## 2018-12-30 NOTE — Patient Instructions (Signed)

## 2018-12-30 NOTE — Patient Instructions (Signed)

## 2019-01-04 ENCOUNTER — Other Ambulatory Visit: Payer: Self-pay | Admitting: Family Medicine

## 2019-01-05 ENCOUNTER — Ambulatory Visit (INDEPENDENT_AMBULATORY_CARE_PROVIDER_SITE_OTHER): Payer: Medicare Other | Admitting: Family Medicine

## 2019-01-05 ENCOUNTER — Emergency Department
Admission: EM | Admit: 2019-01-05 | Discharge: 2019-01-05 | Disposition: A | Discharge status: Home / Self Care | Payer: Medicare Other | Attending: Emergency Medicine | Admitting: Emergency Medicine

## 2019-01-05 ENCOUNTER — Encounter: Payer: Self-pay | Admitting: Family Medicine

## 2019-01-05 ENCOUNTER — Ambulatory Visit: Payer: Medicare Other

## 2019-01-05 ENCOUNTER — Other Ambulatory Visit: Payer: Self-pay

## 2019-01-05 ENCOUNTER — Emergency Department: Payer: Medicare Other

## 2019-01-05 VITALS — BP 151/67 | HR 88 | Temp 97.1°F | Ht 62.0 in | Wt 155.4 lb

## 2019-01-05 DIAGNOSIS — Z79899 Other long term (current) drug therapy: Secondary | ICD-10-CM | POA: Insufficient documentation

## 2019-01-05 DIAGNOSIS — I10 Essential (primary) hypertension: Secondary | ICD-10-CM | POA: Insufficient documentation

## 2019-01-05 DIAGNOSIS — E876 Hypokalemia: Secondary | ICD-10-CM

## 2019-01-05 DIAGNOSIS — I720 Aneurysm of carotid artery: Secondary | ICD-10-CM | POA: Diagnosis not present

## 2019-01-05 DIAGNOSIS — Z03818 Encounter for observation for suspected exposure to other biological agents ruled out: Secondary | ICD-10-CM | POA: Diagnosis not present

## 2019-01-05 DIAGNOSIS — N390 Urinary tract infection, site not specified: Secondary | ICD-10-CM | POA: Diagnosis not present

## 2019-01-05 DIAGNOSIS — Z7984 Long term (current) use of oral hypoglycemic drugs: Secondary | ICD-10-CM | POA: Insufficient documentation

## 2019-01-05 DIAGNOSIS — E083299 Diabetes mellitus due to underlying condition with mild nonproliferative diabetic retinopathy without macular edema, unspecified eye: Secondary | ICD-10-CM | POA: Diagnosis not present

## 2019-01-05 DIAGNOSIS — R82998 Other abnormal findings in urine: Secondary | ICD-10-CM | POA: Diagnosis not present

## 2019-01-05 DIAGNOSIS — I671 Cerebral aneurysm, nonruptured: Secondary | ICD-10-CM | POA: Diagnosis not present

## 2019-01-05 DIAGNOSIS — R270 Ataxia, unspecified: Secondary | ICD-10-CM | POA: Diagnosis not present

## 2019-01-05 DIAGNOSIS — R42 Dizziness and giddiness: Secondary | ICD-10-CM

## 2019-01-05 DIAGNOSIS — Z170 Estrogen receptor positive status [ER+]: Secondary | ICD-10-CM | POA: Insufficient documentation

## 2019-01-05 DIAGNOSIS — I672 Cerebral atherosclerosis: Secondary | ICD-10-CM | POA: Diagnosis not present

## 2019-01-05 DIAGNOSIS — Z1159 Encounter for screening for other viral diseases: Secondary | ICD-10-CM | POA: Diagnosis not present

## 2019-01-05 DIAGNOSIS — E113293 Type 2 diabetes mellitus with mild nonproliferative diabetic retinopathy without macular edema, bilateral: Secondary | ICD-10-CM | POA: Diagnosis not present

## 2019-01-05 DIAGNOSIS — C50412 Malignant neoplasm of upper-outer quadrant of left female breast: Secondary | ICD-10-CM | POA: Diagnosis not present

## 2019-01-05 DIAGNOSIS — I728 Aneurysm of other specified arteries: Secondary | ICD-10-CM | POA: Diagnosis not present

## 2019-01-05 LAB — BASIC METABOLIC PANEL
Anion gap: 10 (ref 5–15)
BUN: 13 mg/dL (ref 8–23)
CO2: 21 mmol/L — ABNORMAL LOW (ref 22–32)
Calcium: 8.4 mg/dL — ABNORMAL LOW (ref 8.9–10.3)
Chloride: 108 mmol/L (ref 98–111)
Creatinine, Ser: 0.73 mg/dL (ref 0.44–1.00)
GFR calc Af Amer: >60 mL/min (ref >60)
GFR calc non Af Amer: >60 mL/min (ref >60)
Glucose, Bld: 154 mg/dL — ABNORMAL HIGH (ref 70–99)
Potassium: 3.1 mmol/L — ABNORMAL LOW (ref 3.5–5.1)
Sodium: 139 mmol/L (ref 135–145)

## 2019-01-05 LAB — URINALYSIS, COMPLETE (UACMP) WITH MICROSCOPIC
Bilirubin Urine: NEGATIVE
Glucose, UA: NEGATIVE mg/dL
Hgb urine dipstick: NEGATIVE
Ketones, ur: NEGATIVE mg/dL
Nitrite: NEGATIVE
Protein, ur: NEGATIVE mg/dL
Specific Gravity, Urine: 1.01 (ref 1.005–1.030)
pH: 6 (ref 5.0–8.0)

## 2019-01-05 LAB — CBC
HCT: 24.5 % — ABNORMAL LOW (ref 36.0–46.0)
Hemoglobin: 8.1 g/dL — ABNORMAL LOW (ref 12.0–15.0)
MCH: 36.5 pg — ABNORMAL HIGH (ref 26.0–34.0)
MCHC: 33.1 g/dL (ref 30.0–36.0)
MCV: 110.4 fL — ABNORMAL HIGH (ref 80.0–100.0)
Platelets: 86 10*3/uL — ABNORMAL LOW (ref 150–400)
RBC: 2.22 MIL/uL — ABNORMAL LOW (ref 3.87–5.11)
RDW: 20.4 % — ABNORMAL HIGH (ref 11.5–15.5)
WBC: 7.1 10*3/uL (ref 4.0–10.5)
nRBC: 0 % (ref 0.0–0.2)

## 2019-01-05 LAB — MAGNESIUM: Magnesium: 1.5 mg/dL — ABNORMAL LOW (ref 1.7–2.4)

## 2019-01-05 LAB — SARS CORONAVIRUS 2 BY RT PCR (HOSPITAL ORDER, PERFORMED IN ~~LOC~~ HOSPITAL LAB): SARS Coronavirus 2: NEGATIVE

## 2019-01-05 MED ORDER — CEPHALEXIN 500 MG PO CAPS: 500.0000 mg | ORAL_CAPSULE | Freq: Two times a day (BID) | ORAL | 0 refills | Status: DC

## 2019-01-05 MED ORDER — CEPHALEXIN 500 MG PO CAPS
500.0000 mg | ORAL_CAPSULE | Freq: Once | ORAL | Status: AC
  Administered 2019-01-05: 500 mg via ORAL
  Filled 2019-01-05: qty 1

## 2019-01-05 MED ORDER — POTASSIUM CHLORIDE CRYS ER 20 MEQ PO TBCR
40.0000 meq | EXTENDED_RELEASE_TABLET | Freq: Once | ORAL | Status: AC
  Administered 2019-01-05: 07:00:00 40 meq via ORAL
  Filled 2019-01-05: qty 2

## 2019-01-05 MED ORDER — MAGNESIUM SULFATE 2 GM/50ML IV SOLN
2.0000 g | Freq: Once | INTRAVENOUS | Status: AC
  Administered 2019-01-05: 07:00:00 2 g via INTRAVENOUS
  Filled 2019-01-05: qty 50

## 2019-01-05 MED ORDER — SODIUM CHLORIDE 0.9% FLUSH: 3.0000 mL | Freq: Once | INTRAVENOUS | Status: DC

## 2019-01-05 NOTE — Assessment & Plan Note (Signed)
Due for re-eval of cholesterol. Repeat A1C in 3 months.

## 2019-01-05 NOTE — Consult Note (Signed)
Neurosurgery-New Consultation Evaluation 01/05/2019 Katie [name] [id number]  Identifying Statement: Katie [name] is a 73 y.o. female from [place] [address] with gait instability  Physician Requesting Consultation: No ref. provider found  History of Present Illness: Katie [name] is here for evaluation in the emergency department for an episode where she felt she was listing to one side and bumping into the wall.  She is undergoing current treatment for breast cancer and states she has another chemotherapy session planned.  She has been experiencing some visual changes and numbness in her fingers from this.  Otherwise, she is tolerating this well.  She does not states she felt dehydrated.  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  She states she does not have any previous history of this but she does have a brother that had an aneurysm.  She has never used tobacco products.  She does have controlled hypertension.  She is not on any blood thinners.  Currently, she states she feels better as she stood up.  She is not complaining of any headache throughout this episode.  She denies any double vision.  Past Medical History:  Past Medical History:  Diagnosis Date  . Allergy   . Anemia   . Blood transfusion without reported diagnosis   . Diverticulitis   . DJD (degenerative joint disease)   . DM type 2 (diabetes mellitus, type 2) ([place])   . Family history of breast cancer   . Fibrocystic breast disease   . GERD (gastroesophageal reflux disease)   . History of chicken pox   . Hypercholesteremia   . Hypertension   . Lichen planopilaris   . Urinary incontinence     Social History: Social History   Socioeconomic History  . Marital status: Married    Spouse name: Not on file  . Number of children: 1  . Years of education: Not on file  . Highest education level: Not on file   Occupational History  . Occupation: Retired  Scientific laboratory technician  . Financial resource strain: Not on file  . Food insecurity:    Worry: Not on file    Inability: Not on file  . Transportation needs:    Medical: Not on file    Non-medical: Not on file  Tobacco Use  . Smoking status: Never Smoker  . Smokeless tobacco: Never Used  Substance and Sexual Activity  . Alcohol use: No    Alcohol/week: 0.0 standard drinks  . Drug use: No  . Sexual activity: Never    Partners: Male  Lifestyle  . Physical activity:    Days per week: Not on file    Minutes per session: Not on file  . Stress: Not on file  Relationships  . Social connections:    Talks on phone: Not on file    Gets together: Not on file    Attends religious service: Not on file    Active member of club or organization: Not on file    Attends meetings of clubs or organizations: Not on file    Relationship status: Not on file  . Intimate partner violence:    Fear of current or ex partner: Not on file    Emotionally abused: Not on file    Physically abused: Not on file    Forced sexual activity: Not on file  Other Topics Concern  . Not on file  Social History Narrative   Married for 37 years.    She has one child and one stepston.       Family History: Family History  Problem Relation Age of Onset  . Arthritis Mother   . Hypertension Mother   . Heart disease Father   . Diabetes Father   . Breast cancer Sister 32       d. 62  . Hypertension Brother   . Leukemia Brother   . Coronary artery disease Brother     Review of Systems:  Review of Systems - General ROS: Negative Psychological ROS: Negative Ophthalmic ROS: Negative ENT ROS: Negative Hematological and Lymphatic ROS: Negative  Endocrine ROS: Negative Respiratory ROS: Negative Cardiovascular ROS: Negative Gastrointestinal ROS: Negative Genito-Urinary ROS: Negative Musculoskeletal ROS: Negative Neurological ROS: Positive for gait instability,  negative for headache Dermatological ROS: Negative  Physical Exam: BP 137/69   Pulse 69   Temp 97.7 F (36.5 C) (Oral)   Resp (!) 22   Ht 5\' 2"  (1.575 m)   Wt 68.9 kg   LMP 08/26/1992 (Approximate)   SpO2 95%   BMI 27.80 kg/m  Body mass index is 27.8 kg/m. Body surface area is 1.74 meters squared. General appearance: Alert, cooperative, in no acute distress Head: Normocephalic, atraumatic Eyes: Normal, EOM intact Oropharynx: Not examined, patient wearing mask Ext: No edema in LE bilaterally  Neurologic exam:  Mental status: alertness: alert, orientation: person, place, time, affect: normal Speech: fluent and clear, naming and repetition are intact Cranial nerves:  II: Visual fields are full by confrontation, no ptosis III/IV/VI: extra-ocular motions intact bilaterally V/VII:no evidence of facial droop or weakness  VIII: hearing normal XI: trapezius strength symmetric,  sternocleidomastoid strength symmetric XII: tongue strength symmetric  Motor:strength symmetric 5/5, normal muscle mass and tone in all extremities and no pronator drift Sensory: intact to light touch in all extremities the exception of some numbness in the fingertips Gait: Not tested  Laboratory: Results for orders placed or performed during the hospital encounter of 01/05/19  SARS Coronavirus 2 (CEPHEID - Performed in [place] hospital lab), [facility] Order  Result Value Ref Range   SARS Coronavirus 2 NEGATIVE NEGATIVE  Basic metabolic panel  Result Value Ref Range   Sodium 139 135 - 145 mmol/L   Potassium 3.1 (L) 3.5 - 5.1 mmol/L   Chloride 108 98 - 111 mmol/L   CO2 21 (L) 22 - 32 mmol/L   Glucose, Bld 154 (H) 70 - 99 mg/dL   BUN 13 8 - 23 mg/dL   Creatinine, Ser 0.73 0.44 - 1.00 mg/dL   Calcium 8.4 (L) 8.9 - 10.3 mg/dL   GFR calc non Af Amer >60 >60 mL/min   GFR calc Af Amer >60 >60 mL/min   Anion gap 10 5 - 15  CBC  Result Value Ref Range   WBC 7.1 4.0 - 10.5 K/uL   RBC 2.22 (L) 3.87 - 5.11  MIL/uL   Hemoglobin 8.1 (L) 12.0 - 15.0 g/dL   HCT 24.5 (L) 36.0 - 46.0 %   MCV 110.4 (H) 80.0 - 100.0 fL   MCH 36.5 (H) 26.0 - 34.0 pg   MCHC 33.1 30.0 - 36.0 g/dL   RDW 20.4 (H) 11.5 - 15.5 %   Platelets 86 (L) 150 - 400 K/uL   nRBC 0.0 0.0 - 0.2 %  Urinalysis, Complete w Microscopic  Result Value Ref Range   Color, Urine YELLOW (A) YELLOW   APPearance CLEAR (A) CLEAR  Specific Gravity, Urine 1.010 1.005 - 1.030   pH 6.0 5.0 - 8.0   Glucose, UA NEGATIVE NEGATIVE mg/dL   Hgb urine dipstick NEGATIVE NEGATIVE   Bilirubin Urine NEGATIVE NEGATIVE   Ketones, ur NEGATIVE NEGATIVE mg/dL   Protein, ur NEGATIVE NEGATIVE mg/dL   Nitrite NEGATIVE NEGATIVE   Leukocytes,Ua TRACE (A) NEGATIVE   RBC / HPF 0-5 0 - 5 RBC/hpf   WBC, UA 11-20 0 - 5 WBC/hpf   Bacteria, UA RARE (A) NONE SEEN   Squamous Epithelial / LPF 0-5 0 - 5   Mucus PRESENT   Magnesium  Result Value Ref Range   Magnesium 1.5 (L) 1.7 - 2.4 mg/dL   I personally reviewed labs  Imaging: MRI/MRA brain:1. 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 2. Mild to moderate for age nonspecific cerebral white matter signal changes, most commonly due to chronic small vessel disease. Note that early metastatic disease to the brain cannot be excluded in the absence of IV contrast. 3. Mild for age intracranial atherosclerosis. No intracranial artery stenosis or major circle-of-Willis branch occlusion identified. 4. Mild mastoid effusions, typically postinflammatory  Impression/Plan:  Mrs. Benjamin is an emergency department for work-up of some vague symptoms, however she was found on MRI to have a small aneurysm on the right side.  Discussed the natural history of these with her and given the current size, she is at low risk of rupture.  I do not think her current symptoms are related to this but did tell her to watch for any signs of visual changes or severe headaches.  I do think it is  worthwhile considering further diagnostic imaging consisting of an arteriogram, however at this time she is currently undergoing treatment for breast cancer and I do think the angiogram can be delayed to after this.  I will put in a referral to the [facility] neurovascular surgeons in order to facilitate this.  Given no current symptoms, she is safe to undergo any procedures as she needs for her breast cancer treatment.   1.  Diagnosis: Right paraophthalmic aneurysm  2.  Plan -Will place referral to do neurovascular surgery for cerebral arteriogram

## 2019-01-05 NOTE — Assessment & Plan Note (Signed)
Has follow up for further eval and recommendations with neurosurgery.. Dr. Lacinda Axon.

## 2019-01-05 NOTE — ED Notes (Signed)
Alert and oriented. Dr Karma Greaser at bedside to update patient. NAD. Unlabored.

## 2019-01-05 NOTE — Assessment & Plan Note (Signed)
No urinary symptoms.. not clearly definite UTI. Pt will start Keflex as recommended by ER but if culture negative will D/C.

## 2019-01-05 NOTE — ED Provider Notes (Signed)
[facility] Emergency Department Provider Note  ____________________________________________   First MD Initiated Contact with Patient 01/05/19 [phone]     (approximate)  I have reviewed the triage vital signs and the nursing notes.   HISTORY  Chief Complaint Dizziness    HPI Katie [name] is a 73 y.o. female with medical history as listed below which is most notable for breast cancer currently undergoing chemotherapy at [facility].  She presents tonight for evaluation of acute onset difficulty with ambulation.  She says that she got up during the night to go the bathroom and all of a sudden she felt lightheaded and she was veering to the right.  She says she was unable to control herself and curved off to the right to the point that she banged up against a door and bruised her right arm.  She felt very odd and "disoriented".  She says that she feels better now but she has not gotten up recently so she is not sure if she still feels off balance or like she is falling to the right.  She has no numbness nor weakness in any of her extremities.  She denies headache, sore throat, cough, shortness of breath, vomiting, abdominal pain, chest pain, and dysuria.  She has had a lot of nausea during the chemotherapy but she currently does not feel nauseated and it has not been worse than usual lately.  She has been isolating herself and has not been around anyone with COVID-19.  She has not had any difficulty with word finding.  She says that her vision has deteriorated significantly after starting chemotherapy but it is not acutely worse right now.        Past Medical History:  Diagnosis Date   Allergy    Anemia    Blood transfusion without reported diagnosis    Diverticulitis    DJD (degenerative joint disease)    DM type 2 (diabetes mellitus, type 2) ([place])    Family history of breast cancer    Fibrocystic breast disease    GERD (gastroesophageal reflux  disease)    History of chicken pox    Hypercholesteremia    Hypertension    Lichen planopilaris    Urinary incontinence     Patient Active Problem List   Diagnosis Date Noted   Diarrhea 10/15/2018   Dehydration 10/15/2018   Anemia due to chemotherapy 10/15/2018   AKI (acute kidney injury) ([place]) 10/09/2018   Port-A-Cath in place 09/29/2018   Diabetic retinopathy ([place]) 09/29/2018   Genetic testing 09/23/2018   Family history of leukemia 09/16/2018   Family history of breast cancer    Malignant neoplasm of upper-outer quadrant of left breast in female, estrogen receptor positive ([place]) 09/11/2018   Mastitis 08/28/2018   H/O bilateral breast implants 08/28/2018   Family history of breast cancer in sister 08/28/2018   Pre-op examination 02/17/2018   Diverticular disease 10/17/2017   Obesity (BMI 30-39.9) 09/02/2017   Primary osteoarthritis of right knee 03/07/2017   HSV-1 (herpes simplex virus 1) infection, 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   Vitamin D deficiency 10/11/2016   Family history of thyroid disorder 04/04/2015   Osteopenia 10/04/2014   Counseling regarding end of life decision making 10/04/2014   OA (osteoarthritis) of neck 08/02/2014   History of duodenal ulcer 08/02/2014   Seasonal allergies 08/02/2014   Essential hypertension, benign 08/02/2014   High cholesterol 58/52/7782   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   History of joint replacement 03/01/2014   Controlled type 2  diabetes with retinopathy ([place]) 11/07/2009    Past Surgical History:  Procedure Laterality Date   BREAST SURGERY  02/1975   Left Breast Biopsy-Fibrocystic Disease   BREAST SURGERY  10/1981   Bialteral Capsulectomy Breast Surgery   BREAST SURGERY  07/1998   Replace Bilateral Silicone Implants   BREAST SURGERY  01/1976   Bilater breast surgery to remove fibrocystic tissue and replace with silicone implants   CHOLECYSTECTOMY N/A 07/08/2016   Procedure: LAPAROSCOPIC  CHOLECYSTECTOMY;  Surgeon: [name], MD;  Location: [facility] ORS;  Service: General;  Laterality: N/A;   COLONOSCOPY WITH PROPOFOL N/A 11/07/2017   Procedure: COLONOSCOPY WITH PROPOFOL;  Surgeon: Jonathon Bellows, MD;  Location: [facility] ENDOSCOPY;  Service: Gastroenterology;  Laterality: N/A;   DILATION AND CURETTAGE OF UTERUS  01/1994   ESOPHAGOGASTRODUODENOSCOPY (EGD) WITH PROPOFOL N/A 12/05/2016   Procedure: ESOPHAGOGASTRODUODENOSCOPY (EGD) WITH PROPOFOL;  Surgeon: Jonathon Bellows, MD;  Location: [facility] ENDOSCOPY;  Service: Endoscopy;  Laterality: N/A;   FINGER SURGERY  2001 & 2003   Trigger Finger   FOOT SURGERY  1980   To Relieve Pinched Nerve   FOOT SURGERY  07/2008   Left Foot-Plantar Fasciitis   JOINT REPLACEMENT  04/19/2013   Hip Replacement-Right   PLACEMENT OF BREAST IMPLANTS  12/26/2004   Replace Failed Implants   RHINOPLASTY  03/1964   To Correct Deviated Septum    Prior to Admission medications   Medication Sig Start Date End Date Taking? Authorizing Provider  acetaminophen (TYLENOL) 325 MG tablet Take 650 mg by mouth every 6 (six) hours as needed for mild pain or headache.    [provider]  amLODipine (NORVASC) 5 MG tablet Take 1 tablet (5 mg total) by mouth daily. 10/15/18   Bedsole, Amy E, MD  atorvastatin (LIPITOR) 10 MG tablet TAKE 1 TABLET(10 MG) BY MOUTH DAILY 01/04/19   Bedsole, Amy E, MD  Azelastine HCl 137 MCG/SPRAY SOLN instill 1 spray into each nostril twice a day for 1 week then if needed 09/05/17   Bedsole, Amy E, MD  cephALEXin (KEFLEX) 500 MG capsule Take 1 capsule (500 mg total) by mouth 2 (two) times daily. 01/05/19   Hinda Kehr, MD  cholecalciferol (VITAMIN D) 400 units TABS tablet Take 400 Units by mouth 2 (two) times daily.    [provider]  cholestyramine (QUESTRAN) 4 g packet Take 1 packet (4 g total) by mouth 3 (three) times daily with meals. 10/14/18   [name], MD  clobetasol (TEMOVATE) 0.05 % external solution Apply 1 application  topically 2 (two) times daily as needed (scalp). Reported on 08/16/2015 06/02/14   [provider]  diphenoxylate-atropine (LOMOTIL) 2.5-0.025 MG tablet  10/12/18   [provider]  fluconazole (DIFLUCAN) 150 MG tablet Take 1 tablet for a yeast infection if needed, may repeat in 3 days if needed 11/19/18   Tanner, Lyndon Code., PA-C  fluticasone (FLONASE) 50 MCG/ACT nasal spray INSTILL 2 SPRAYS INTO EACH NOSTRIL ONCE DAILY 11/13/18   Bedsole, Amy E, MD  glucose blood test strip USE TO CHECK BLOOD SUGAR DAILY 10/29/18   Bedsole, Amy E, MD  lidocaine-prilocaine (EMLA) cream Apply to affected area once Patient taking differently: Apply 1 application topically as needed (port access).  09/16/18   [name], MD  loperamide (IMODIUM) 2 MG capsule Take 2 mg by mouth as needed for diarrhea or loose stools.    [provider]  loratadine (CLARITIN) 10 MG tablet Take 10 mg by mouth daily.    [provider]  LORazepam (ATIVAN) 0.5 MG tablet TAKE 1 TABLET(0.5 MG) BY MOUTH AT BEDTIME AS NEEDED FOR SLEEP 11/13/18   [name], MD  magnesium oxide (MAG-OX) 400 (241.3 Mg) MG tablet Take 1 tablet (400 mg total) by mouth daily. 12/23/18   [name], MD  metFORMIN (GLUCOPHAGE-XR) 500 MG 24 hr tablet Take 1 tablet (500 mg total) by mouth daily with breakfast. Patient taking differently: Take 500 mg by mouth at bedtime.  09/29/18   Bedsole, Amy E, MD  metroNIDAZOLE (METROCREAM) 0.75 % cream Apply 1 application topically 2 (two) times daily as needed (rosacea).     [provider]  Multiple Vitamin (MULTIVITAMIN WITH MINERALS) TABS tablet Take 1 tablet by mouth daily.    [provider]  Olopatadine HCl 0.2 % SOLN 1 drop per eye daily 10/15/18   Bedsole, Amy E, MD  ondansetron (ZOFRAN) 8 MG tablet Take 1 tablet (8 mg total) by mouth 2 (two) times daily as needed (Nausea or vomiting). Begin 4 days after chemotherapy. 09/16/18   [name], MD  prochlorperazine (COMPAZINE)  10 MG tablet Take 1 tablet (10 mg total) by mouth every 6 (six) hours as needed (Nausea or vomiting). 09/16/18   [name], MD  triamcinolone cream (KENALOG) 0.1 % Apply 1 application topically 2 (two) times daily. Patient taking differently: Apply 1 application topically 2 (two) times daily as needed (skin rashes).  03/17/18   Bedsole, Amy E, MD  valACYclovir (VALTREX) 500 MG tablet Take 1 tablet (500 mg total) by mouth 2 (two) times daily. Patient taking differently: Take 500 mg by mouth as needed (flare up).  03/17/18   [name], MD    Allergies Erythromycin; Ciprofloxacin; Daypro [oxaprozin]; Enalapril maleate; Nsaids; and Vioxx [rofecoxib]  Family History  Problem Relation Age of Onset   Arthritis Mother    Hypertension Mother    Heart disease Father    Diabetes Father    Breast cancer Sister 55       d. 62   Hypertension Brother    Leukemia Brother    Coronary artery disease Brother     Social History Social History   Tobacco Use   Smoking status: Never Smoker   Smokeless tobacco: Never Used  Substance Use Topics   Alcohol use: No    Alcohol/week: 0.0 standard drinks   Drug use: No    Review of Systems Constitutional: No fever/chills Eyes: No acute visual changes. ENT: No sore throat. Cardiovascular: Denies chest pain. Respiratory: Denies shortness of breath. Gastrointestinal: No abdominal pain.  No acute nausea nor vomiting.  No diarrhea.  No constipation. Genitourinary: Negative for dysuria. Musculoskeletal: Negative for neck pain.  Negative for back pain. Integumentary: Negative for rash. Neurological: No headache.  Acute onset falling to the right/ataxia as described above.   ____________________________________________   PHYSICAL EXAM:  VITAL SIGNS: ED Triage Vitals  Enc Vitals Group     BP 01/05/19 0240 119/83     Pulse Rate 01/05/19 0240 83     Resp 01/05/19 0240 20     Temp 01/05/19 0240 98.3 F (36.8 C)     Temp Source  01/05/19 0240 Oral     SpO2 01/05/19 0240 96 %     Weight 01/05/19 0234 68.9 kg (152 lb)     Height 01/05/19 0234 1.575 m (5\' 2" )     Head Circumference --      Peak Flow --      Pain Score 01/05/19 0234 0  Pain Loc --      Pain Edu? --      Excl. in [place]? --     Constitutional: Alert and oriented. Well appearing and in no acute distress. Eyes: Conjunctivae are normal. PERRL. EOMI. no nystagmus. Head: Atraumatic. Nose: No congestion/rhinnorhea. Mouth/Throat: Mucous membranes are moist. Neck: No stridor.  No meningeal signs.   Cardiovascular: Normal rate, regular rhythm. Good peripheral circulation. Grossly normal heart sounds. Respiratory: Normal respiratory effort.  No retractions. No audible wheezing. Gastrointestinal: Soft and nontender. No distention.  Musculoskeletal: No lower extremity tenderness nor edema. No gross deformities of extremities. Neurologic:  Normal speech and language. No gross focal neurologic deficits are appreciated.  She has no dysmetria on finger-to-nose testing, good muscle strength in bilateral grips, upper extremities, and lower extremities, no obvious facial droop nor other cranial nerve deficit. Skin:  Skin is warm, dry and intact. No rash noted. Psychiatric: Mood and affect are normal. Speech and behavior are normal.  ____________________________________________   LABS (all labs ordered are listed, but only abnormal results are displayed)  Labs Reviewed  BASIC METABOLIC PANEL - Abnormal; Notable for the following components:      Result Value   Potassium 3.1 (*)    CO2 21 (*)    Glucose, Bld 154 (*)    Calcium 8.4 (*)    All other components within normal limits  CBC - Abnormal; Notable for the following components:   RBC 2.22 (*)    Hemoglobin 8.1 (*)    HCT 24.5 (*)    MCV 110.4 (*)    MCH 36.5 (*)    RDW 20.4 (*)    Platelets 86 (*)    All other components within normal limits  URINALYSIS, COMPLETE (UACMP) WITH MICROSCOPIC -  Abnormal; Notable for the following components:   Color, Urine YELLOW (*)    APPearance CLEAR (*)    Leukocytes,Ua TRACE (*)    Bacteria, UA RARE (*)    All other components within normal limits  MAGNESIUM - Abnormal; Notable for the following components:   Magnesium 1.5 (*)    All other components within normal limits  SARS CORONAVIRUS 2 (HOSPITAL ORDER, [place] [facility])  URINE CULTURE  CBG MONITORING, ED   ____________________________________________  EKG  ED ECG REPORT I, Hinda Kehr, the attending physician, personally viewed and interpreted this ECG.  Date: 01/05/2019 EKG Time: 2:42 AM Rate: 88 Rhythm: normal sinus rhythm QRS Axis: normal Intervals: normal ST/T Wave abnormalities: No concerning abnormalities that would suggest ischemia Narrative Interpretation: no evidence of acute ischemia  ____________________________________________  RADIOLOGY I, Hinda Kehr, personally viewed and evaluated these images (plain radiographs) as part of my medical decision making, as well as reviewing the written report by the radiologist.  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    Official radiology report(s): Mr Virgel Paling WC Contrast  Result Date: 01/05/2019 CLINICAL DATA:  73 year old female with ataxia, vertigo. Currently. Undergoing chemotherapy for breast cancer EXAM: MRI HEAD WITHOUT CONTRAST MRA HEAD WITHOUT CONTRAST TECHNIQUE: Multiplanar, multiecho pulse sequences of the brain and surrounding structures were obtained without intravenous contrast. Angiographic images of the head were obtained using MRA technique without contrast. COMPARISON:  No prior brain imaging. chest abdominal and pelvis imaging earlier this year. FINDINGS: MRI HEAD FINDINGS Brain: No restricted diffusion to suggest acute infarction. No midline shift, mass effect, evidence of mass lesion,  ventriculomegaly, extra-axial collection  or acute intracranial hemorrhage. Cervicomedullary junction and pituitary are within normal limits. Scattered cerebral white matter T2 and FLAIR hyperintensity, mostly periventricular and mild to moderate for age. No cortical encephalomalacia or chronic cerebral blood products identified. The deep gray matter nuclei, brainstem, and cerebellum are within normal limits. Vascular: Major intracranial vascular flow voids are preserved. Skull and upper cervical spine: Negative visible cervical spine. Visualized bone marrow signal is within normal limits. Sinuses/Orbits: Negative orbits. Trace paranasal sinus mucosal thickening. Other: Mild mastoid effusions, mostly on the right. Trace retained secretions in the nasopharynx. Grossly negative visible internal auditory structures. Scalp and face soft tissues appear negative. MRA HEAD FINDINGS Antegrade flow in the posterior circulation with dominant distal left vertebral artery. The distal right vertebral functionally terminates in PICA. Normal PICA origins. No distal vertebral stenosis. Patent basilar artery without stenosis. Normal SCA and PCA origins. Mildly tortuous left P1 segment. Bilateral PCA branches are within normal limits. Antegrade flow in both ICA siphons. Mild siphon irregularity compatible with atherosclerosis. No siphon stenosis. 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 The right ophthalmic is also tortuous. Normal left ophthalmic artery origin. Posterior communicating arteries are diminutive or absent. Normal carotid termini, MCA and ACA origins. Anterior communicating artery and visible ACA branches are within normal limits. MCA M1 segments and trifurcations appear normal. Visible MCA branches are within normal limits. IMPRESSION: 1. 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 2. Mild to moderate for age nonspecific cerebral white matter signal changes, most commonly due to chronic small vessel disease. Note that early metastatic disease to the brain cannot be excluded in the absence of IV contrast. 3. Mild for age intracranial atherosclerosis. No intracranial artery stenosis or major circle-of-Willis branch occlusion identified. 4. Mild mastoid effusions, typically postinflammatory. Electronically Signed   By: Genevie Ann M.D.   On: 01/05/2019 05:59   Mr Brain Wo Contrast  Result Date: 01/05/2019 CLINICAL DATA:  73 year old female with ataxia, vertigo. Currently. Undergoing chemotherapy for breast cancer EXAM: MRI HEAD WITHOUT CONTRAST MRA HEAD WITHOUT CONTRAST TECHNIQUE: Multiplanar, multiecho pulse sequences of the brain and surrounding structures were obtained without intravenous contrast. Angiographic images of the head were obtained using MRA technique without contrast. COMPARISON:  No prior brain imaging. chest abdominal and pelvis imaging earlier this year. FINDINGS: MRI HEAD FINDINGS Brain: No restricted diffusion to suggest acute infarction. No midline shift, mass effect, evidence of mass lesion, ventriculomegaly, extra-axial collection or acute intracranial hemorrhage. Cervicomedullary junction and pituitary are within normal limits. Scattered cerebral white matter T2 and FLAIR hyperintensity, mostly periventricular and mild to moderate for age. No cortical encephalomalacia or chronic cerebral blood products identified. The deep gray matter nuclei, brainstem, and cerebellum are within normal limits. Vascular: Major intracranial vascular flow voids are preserved. Skull and upper cervical spine: Negative visible cervical spine. Visualized bone marrow signal is within normal limits. Sinuses/Orbits: Negative orbits. Trace paranasal sinus mucosal thickening. Other: Mild mastoid effusions, mostly on the right. Trace retained secretions in the  nasopharynx. Grossly negative visible internal auditory structures. Scalp and face soft tissues appear negative. MRA HEAD FINDINGS Antegrade flow in the posterior circulation with dominant distal left vertebral artery. The distal right vertebral functionally terminates in PICA. Normal PICA origins. No distal vertebral stenosis. Patent basilar artery without stenosis. Normal SCA and PCA origins. Mildly tortuous left P1 segment. Bilateral PCA branches  are within normal limits. Antegrade flow in both ICA siphons. Mild siphon irregularity compatible with atherosclerosis. No siphon stenosis. 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 The right ophthalmic is also tortuous. Normal left ophthalmic artery origin. Posterior communicating arteries are diminutive or absent. Normal carotid termini, MCA and ACA origins. Anterior communicating artery and visible ACA branches are within normal limits. MCA M1 segments and trifurcations appear normal. Visible MCA branches are within normal limits. IMPRESSION: 1. 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 2. Mild to moderate for age nonspecific cerebral white matter signal changes, most commonly due to chronic small vessel disease. Note that early metastatic disease to the brain cannot be excluded in the absence of IV contrast. 3. Mild for age intracranial atherosclerosis. No intracranial artery stenosis or major circle-of-Willis branch occlusion identified. 4. Mild mastoid effusions, typically postinflammatory. Electronically Signed   By: Genevie Ann M.D.   On: 01/05/2019 05:59    ____________________________________________   PROCEDURES   Procedure(s) performed (including Critical Care):  Procedures   ____________________________________________   INITIAL IMPRESSION / MDM / [place] / ED  COURSE  As part of my medical decision making, I reviewed the following data within the [place] notes reviewed and incorporated, Labs reviewed , EKG interpreted , Old chart reviewed, Radiograph reviewed , A consult was requested and obtained from this/these consultant(s) Neurosurgery and Notes from prior ED visits      *Katie [name] was evaluated in Emergency Department on 01/05/2019 for the symptoms described in the history of present illness. She was evaluated in the context of the global COVID-19 pandemic, which necessitated consideration that the patient might be at risk for infection with the SARS-CoV-2 virus that causes COVID-19. Institutional protocols and algorithms that pertain to the evaluation of patients at risk for COVID-19 are in a state of rapid change based on information released by regulatory bodies including the CDC and federal and state organizations. These policies and algorithms were followed during the patient's care in the ED.*  Differential diagnosis includes, but is not limited to, medication/chemotherapy side effect, CVA/TIA, infection, metabolic or electrolyte abnormality, metastases to the brain.  The patient is well-appearing and in no acute distress with normal vital signs.  She has had no infectious signs or symptoms.  She says that she was fully scanned and worked up prior to starting chemotherapy and I think the metastases are unlikely.  She says that she had a prior blood clot in her leg one time after a long trip but she is not currently on anticoagulation.  She has not had any unilateral leg pain or swelling recently in the and she is having no respiratory symptoms that would suggest a pulmonary embolism, but stroke so must be considered.  This may all be medication or chemotherapy side effect, but I discussed with her and we will evaluate with MR brain and MRA Head to look for any sign of CVA or posterior circulation limitation.  Her lab  work is generally reassuring.  She is anemic but her hemoglobin is stable from a week ago at the cancer center.  She does have some white blood cells in her urine and it is possible she has a mild UTI.  I will begin treatment with Keflex given that she is immunosuppressed but there is no sign of  pyelonephritis or sepsis.  She agrees with the current plan.  Given the possibility she may need to be admitted to the hospital I am sending a COVID-19 nasal swab so that it will be processing while the patient is in MRI.   Clinical Course as of Jan 04 737  Tue Jan 05, 2019  6789 SARS Coronavirus 2: NEGATIVE [CF]  [id number] No evidence of stroke.  The patient does have a saccular aneurysm and I have no old imaging against which to compare to see if this is new.  I will discuss with the patient and then after 7 AM I will call the oncoming neurosurgeon to discuss the case as well.  MR BRAIN WO CONTRAST [CF]  [phone] Paging Dr. Izora Ribas to discuss.   [CF]  0708 Patient has some hypokalemia and hypomagnesemia, both of which she anticipated.  I will replete both with potassium 40 mEq by mouth and 2 g of magnesium IV.  She can then follow-up with her oncologist assuming that the neurosurgeon does not think she needs to be kept in the hospital.   [CF]    Clinical Course User Index [CF] Hinda Kehr, MD     ____________________________________________  FINAL CLINICAL IMPRESSION(S) / ED DIAGNOSES  Final diagnoses:  Urinary tract infection without hematuria, site unspecified  Ataxia  Hypokalemia  Hypomagnesemia     MEDICATIONS GIVEN DURING THIS VISIT:  Medications  sodium chloride flush (NS) 0.9 % injection 3 mL (has no administration in time range)  magnesium sulfate IVPB 2 g 50 mL (2 g Intravenous New Bag/Given 01/05/19 0728)  cephALEXin (KEFLEX) capsule 500 mg (500 mg Oral Given 01/05/19 0603)  potassium chloride SA (K-DUR) CR tablet 40 mEq (40 mEq Oral Given 01/05/19 0724)     ED Discharge Orders           Ordered    cephALEXin (KEFLEX) 500 MG capsule  2 times daily     01/05/19 0736           Note:  This document was prepared using Dragon voice recognition software and may include unintentional dictation errors.   Hinda Kehr, MD 01/05/19 [phone]

## 2019-01-05 NOTE — ED Triage Notes (Addendum)
Pt arrives to ED via POV from home with c/o dizziness. Pt states she was getting up to use the bathroom, and "fell sideways into the wall" when she lost her balance. Pt did not actually fall or sustain any head injury. Pt is currently a chemo pt at [facility] for breast cancer. Pt denies CP or SHOB. Pt is A&O, in NAD; RR even, regular, and unlabored.

## 2019-01-05 NOTE — ED Provider Notes (Signed)
-----------------------------------------   8:09 AM on 01/05/2019 -----------------------------------------  Dr. Lacinda Axon has been down to see the patient.  They will arrange outpatient follow-up/surgery for the patient.  Patient will be discharged home with PCP follow-up   Harvest Dark, MD 01/05/19 [phone]

## 2019-01-05 NOTE — Assessment & Plan Note (Signed)
Unclear etiology.. multiple possibly causes.. no red flags.  Symptoms resolved.

## 2019-01-05 NOTE — Discharge Instructions (Signed)
As we discussed, your work-up was relatively reassuring.  Your potassium and magnesium levels were a little bit low which we repleted.  Please talk with your oncologist at the next available opportunity for a follow-up appointment.  You have a mild urinary tract infection for which we have prescribed antibiotics.  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  You were evaluated by Dr. Lacinda Axon, the neurosurgeon on-call, and he feels you are appropriate for discharge and outpatient follow-up.  If you choose not to follow-up in [place] as per his recommendations, please speak with your oncologist to see if she has appropriate contacts in [place].    Return to the emergency department if you develop new or worsening symptoms that concern you.

## 2019-01-05 NOTE — ED Notes (Signed)
Patient states she woke up and was going to bathroom and became dizzy when walking and could not keep balance. Patient denies falling.

## 2019-01-05 NOTE — Progress Notes (Signed)
VIRTUAL VISIT Due to national recommendations of social distancing due to [place] 19, a virtual visit is felt to be most appropriate for this patient at this time.   I connected with the patient on 01/05/19 at  4:00 PM EDT by virtual telehealth platform and verified that I am speaking with the correct person using two identifiers.   I discussed the limitations, risks, security and privacy concerns of performing an evaluation and management service by  virtual telehealth platform and the availability of in person appointments. I also discussed with the patient that there may be a patient responsible charge related to this service. The patient expressed understanding and agreed to proceed.  Patient location: Home Provider Location: [facility] Participants: Eliezer Lofts and [name]   Chief Complaint  Patient presents with  . Follow-up    ER Visit    History of Present Illness: 73 year old female currently treated with chemotherapy for breast cancer presented for ER follow up following ER visit earlier today for lightheadedness and veering to the right. Felt odd and disoriented. She had no numbness nor weakness in any of her extremities.  She denied headache, sore throat, cough, shortness of breath, vomiting, abdominal pain, chest pain, and dysuria.  She has had a lot of nausea during the chemotherapy but she did not feel nauseated and it has not been worse than usual lately. 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 2. Mild to moderate for age nonspecific cerebral white matter signal changes, most commonly due to chronic small vessel disease. Note that early metastatic disease to the brain  cannot be excluded in the absence of IV contrast. 3. Mild for age intracranial atherosclerosis. No intracranial artery stenosis or major circle-of-Willis branch occlusion identified. 4. Mild mastoid effusions, typically postinflammatory.   Started on keflex for possible Mild UTI. Urine culture pending Negative covid 19 test   Has follow up schedule to follow up on Right ICA Paraophthalmic Aneurysm with  Neurosurgery Has appt planned at [facility].   Has MRI breast on 5/26 to re-eval cancer.. plans breast surgery in 01/2019    She reports she is doing well now. No further dizziness.  COVID 19 screen No recent travel or known exposure to COVID19 The patient denies respiratory symptoms of COVID 19 at this time.  The importance of social distancing was discussed today.   Review of Systems  Constitutional: Positive for malaise/fatigue. Negative for chills and fever.  HENT: Negative for congestion and ear pain.   Eyes: Negative for pain and redness.  Respiratory: Negative for cough and shortness of breath.   Cardiovascular: Negative for chest pain, palpitations and leg swelling.  Gastrointestinal: Positive for nausea. Negative for abdominal pain, blood in stool, constipation, diarrhea and vomiting.  Genitourinary: Negative for dysuria.  Musculoskeletal: Negative for falls and myalgias.  Skin: Negative for rash.  Neurological: Positive for dizziness. Negative for tingling, sensory change, speech change, focal weakness, seizures, loss of consciousness and headaches.  Psychiatric/Behavioral: Negative for depression. The patient is not nervous/anxious.       Past Medical History:  Diagnosis Date  . Allergy   . Anemia   . Blood transfusion without reported diagnosis   . Diverticulitis   .  DJD (degenerative joint disease)   . DM type 2 (diabetes mellitus, type 2) ([place])   . Family history of breast cancer   . Fibrocystic breast disease   . GERD (gastroesophageal reflux disease)   . History of  chicken pox   . Hypercholesteremia   . Hypertension   . Lichen planopilaris   . Urinary incontinence     reports that she has never smoked. She has never used smokeless tobacco. She reports that she does not drink alcohol or use drugs.   Current Outpatient Medications:  .  acetaminophen (TYLENOL) 325 MG tablet, Take 650 mg by mouth every 6 (six) hours as needed for mild pain or headache., Disp: , Rfl:  .  amLODipine (NORVASC) 5 MG tablet, Take 1 tablet (5 mg total) by mouth daily., Disp: 30 tablet, Rfl: 11 .  atorvastatin (LIPITOR) 10 MG tablet, TAKE 1 TABLET(10 MG) BY MOUTH DAILY, Disp: 90 tablet, Rfl: 0 .  Azelastine HCl 137 MCG/SPRAY SOLN, instill 1 spray into each nostril twice a day for 1 week then if needed, Disp: 30 mL, Rfl: 5 .  cephALEXin (KEFLEX) 500 MG capsule, Take 1 capsule (500 mg total) by mouth 2 (two) times daily., Disp: 14 capsule, Rfl: 0 .  cholecalciferol (VITAMIN D) 400 units TABS tablet, Take 400 Units by mouth 2 (two) times daily., Disp: , Rfl:  .  cholestyramine (QUESTRAN) 4 g packet, Take 1 packet (4 g total) by mouth 3 (three) times daily with meals., Disp: 60 each, Rfl: 12 .  clobetasol (TEMOVATE) 0.05 % external solution, Apply 1 application topically 2 (two) times daily as needed (scalp). Reported on 08/16/2015, Disp: , Rfl:  .  diphenoxylate-atropine (LOMOTIL) 2.5-0.025 MG tablet, , Disp: , Rfl:  .  fluconazole (DIFLUCAN) 150 MG tablet, Take 1 tablet for a yeast infection if needed, may repeat in 3 days if needed, Disp: 2 tablet, Rfl: 0 .  fluticasone (FLONASE) 50 MCG/ACT nasal spray, INSTILL 2 SPRAYS INTO EACH NOSTRIL ONCE DAILY, Disp: 16 g, Rfl: 5 .  glucose blood test strip, USE TO CHECK BLOOD SUGAR DAILY, Disp: 50 each, Rfl: 12 .  lidocaine-prilocaine (EMLA) cream, Apply to affected area once (Patient taking differently: Apply 1 application topically as needed (port access). ), Disp: 30 g, Rfl: 3 .  loperamide (IMODIUM) 2 MG capsule, Take 2 mg by mouth as  needed for diarrhea or loose stools., Disp: , Rfl:  .  loratadine (CLARITIN) 10 MG tablet, Take 10 mg by mouth daily., Disp: , Rfl:  .  LORazepam (ATIVAN) 0.5 MG tablet, TAKE 1 TABLET(0.5 MG) BY MOUTH AT BEDTIME AS NEEDED FOR SLEEP, Disp: 30 tablet, Rfl: 0 .  magnesium oxide (MAG-OX) 400 (241.3 Mg) MG tablet, Take 1 tablet (400 mg total) by mouth daily., Disp: 30 tablet, Rfl: 0 .  metFORMIN (GLUCOPHAGE-XR) 500 MG 24 hr tablet, Take 1 tablet (500 mg total) by mouth daily with breakfast. (Patient taking differently: Take 500 mg by mouth at bedtime. ), Disp: 90 tablet, Rfl: 3 .  metroNIDAZOLE (METROCREAM) 0.75 % cream, Apply 1 application topically 2 (two) times daily as needed (rosacea). , Disp: , Rfl:  .  Multiple Vitamin (MULTIVITAMIN WITH MINERALS) TABS tablet, Take 1 tablet by mouth daily., Disp: , Rfl:  .  Olopatadine HCl 0.2 % SOLN, 1 drop per eye daily, Disp: 2.5 mL, Rfl: 0 .  ondansetron (ZOFRAN) 8 MG tablet, Take 1 tablet (8 mg total) by mouth 2 (two) times daily as needed (Nausea or vomiting).  Begin 4 days after chemotherapy., Disp: 30 tablet, Rfl: 1 .  prochlorperazine (COMPAZINE) 10 MG tablet, Take 1 tablet (10 mg total) by mouth every 6 (six) hours as needed (Nausea or vomiting)., Disp: 30 tablet, Rfl: 1 .  triamcinolone cream (KENALOG) 0.1 %, Apply 1 application topically 2 (two) times daily. (Patient taking differently: Apply 1 application topically 2 (two) times daily as needed (skin rashes). ), Disp: 15 g, Rfl: 0 .  valACYclovir (VALTREX) 500 MG tablet, Take 1 tablet (500 mg total) by mouth 2 (two) times daily. (Patient taking differently: Take 500 mg by mouth as needed (flare up). ), Disp: 6 tablet, Rfl: 2   Observations/Objective: Blood pressure (!) 151/67, pulse 88, temperature (!) 97.1 F (36.2 C), temperature source Oral, height 5\' 2"  (1.575 m), weight 155 lb 7 oz (70.5 kg), last menstrual period 08/26/1992.  Physical Exam  Physical Exam Constitutional:      General: The  patient is not in acute distress. Total alopecia from chemo, pale, circles under eyes Pulmonary:     Effort: Pulmonary effort is normal. No respiratory distress.  Neurological:     Mental Status: The patient is alert and oriented to person, place, and time.  Psychiatric:        Mood and Affect: Mood normal.   Cheerful overall.     Behavior: Behavior normal.   Assessment and Plan Lightheaded Unclear etiology.. multiple possibly causes.. no red flags.  Symptoms resolved.  Urine WBC increased No urinary symptoms.. not clearly definite UTI. Pt will start Keflex as recommended by ER but if culture negative will D/C.  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 Repeat A1C in 3 months.  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 Dr. Lacinda Axon.     I discussed the assessment and treatment plan with the patient. The patient was provided an opportunity to ask questions and all were answered. The patient agreed with the plan and demonstrated an understanding of the instructions.   The patient was advised to call back or seek an in-person evaluation if the symptoms worsen or if the condition fails to improve as anticipated.     Eliezer Lofts, MD

## 2019-01-06 ENCOUNTER — Other Ambulatory Visit (INDEPENDENT_AMBULATORY_CARE_PROVIDER_SITE_OTHER): Payer: Medicare Other

## 2019-01-06 ENCOUNTER — Ambulatory Visit (INDEPENDENT_AMBULATORY_CARE_PROVIDER_SITE_OTHER): Payer: Medicare Other

## 2019-01-06 DIAGNOSIS — I671 Cerebral aneurysm, nonruptured: Secondary | ICD-10-CM | POA: Diagnosis not present

## 2019-01-06 DIAGNOSIS — E113293 Type 2 diabetes mellitus with mild nonproliferative diabetic retinopathy without macular edema, bilateral: Secondary | ICD-10-CM | POA: Diagnosis not present

## 2019-01-06 DIAGNOSIS — Z0000 Encounter for general adult medical examination without abnormal findings: Secondary | ICD-10-CM | POA: Diagnosis not present

## 2019-01-06 LAB — LIPID PANEL
Cholesterol: 83 mg/dL (ref 0–200)
HDL: 22.4 mg/dL — ABNORMAL LOW (ref >39.00)
LDL Cholesterol: 25 mg/dL (ref 0–99)
NonHDL: 60.32
Total CHOL/HDL Ratio: 4
Triglycerides: 175 mg/dL — ABNORMAL HIGH (ref 0.0–149.0)
VLDL: 35 mg/dL (ref 0.0–40.0)

## 2019-01-06 LAB — URINE CULTURE
Culture: <10000 — AB
Special Requests: NORMAL

## 2019-01-06 NOTE — Progress Notes (Signed)
9/1 labs  9/4 cpx medicare Pt aware 01/06/2019/rbh

## 2019-01-06 NOTE — Progress Notes (Signed)
I reviewed health advisor's note, was available for consultation, and agree with documentation and plan.   Signed,  Jearlene Bridwell T. Damar Petit, MD  

## 2019-01-06 NOTE — Patient Instructions (Signed)
Katie [name] , Thank you for taking time to come for your Medicare Wellness Visit. I appreciate your ongoing commitment to your health goals. Please review the following plan we discussed and let me know if I can assist you in the future.   These are the goals we discussed: Goals    . Patient Stated     Starting 01/06/2019, I will continue to take medications as prescribed.        This is a list of the screening recommended for you and due dates:  Health Maintenance  Topic Date Due  . Complete foot exam   01/06/2020*  . Urine Protein Check  01/06/2020*  . Flu Shot  03/27/2019  . Hemoglobin A1C  03/30/2019  . Tetanus Vaccine  05/18/2019  . Mammogram  09/01/2019  . Eye exam for diabetics  09/25/2019  . DEXA scan (bone density measurement)  08/31/2020  . Colon Cancer Screening  11/08/2022  .  Hepatitis C: One time screening is recommended by Center for Disease Control  (CDC) for  adults born from [birth year] through 1966.   Completed  . Pneumonia vaccines  Completed  *Topic was postponed. The date shown is not the original due date.   Preventive Care for Adults  A healthy lifestyle and preventive care can promote health and wellness. Preventive health guidelines for adults include the following key practices.  . A routine yearly physical is a good way to check with your health care provider about your health and preventive screening. It is a chance to share any concerns and updates on your health and to receive a thorough exam.  . Visit your dentist for a routine exam and preventive care every 6 months. Brush your teeth twice a day and floss once a day. Good oral hygiene prevents tooth decay and gum disease.  . The frequency of eye exams is based on your age, health, family medical history, use  of contact lenses, and other factors. Follow your health care provider's recommendations for frequency of eye exams.  . Eat a healthy diet. Foods like vegetables, fruits, whole grains, low-fat dairy  products, and lean protein foods contain the nutrients you need without too many calories. Decrease your intake of foods high in solid fats, added sugars, and salt. Eat the right amount of calories for you. Get information about a proper diet from your health care provider, if necessary.  . Regular physical exercise is one of the most important things you can do for your health. Most adults should get at least 150 minutes of moderate-intensity exercise (any activity that increases your heart rate and causes you to sweat) each week. In addition, most adults need muscle-strengthening exercises on 2 or more days a week.  Silver Sneakers may be a benefit available to you. To determine eligibility, you may visit the website: www.silversneakers.com or contact program at 818-130-7193 Mon-Fri between 8AM-8PM.   . Maintain a healthy weight. The body mass index (BMI) is a screening tool to identify possible weight problems. It provides an estimate of body fat based on height and weight. Your health care provider can find your BMI and can help you achieve or maintain a healthy weight.   For adults 20 years and older: ? A BMI below 18.5 is considered underweight. ? A BMI of 18.5 to 24.9 is normal. ? A BMI of 25 to 29.9 is considered overweight. ? A BMI of 30 and above is considered obese.   . Maintain normal blood lipids and cholesterol  levels by exercising and minimizing your intake of saturated fat. Eat a balanced diet with plenty of fruit and vegetables. Blood tests for lipids and cholesterol should begin at age 73 and be repeated every 5 years. If your lipid or cholesterol levels are high, you are over 50, or you are at high risk for heart disease, you may need your cholesterol levels checked more frequently. Ongoing high lipid and cholesterol levels should be treated with medicines if diet and exercise are not working.  . If you smoke, find out from your health care provider how to quit. If you do not  use tobacco, please do not start.  . If you choose to drink alcohol, please do not consume more than 2 drinks per day. One drink is considered to be 12 ounces (355 mL) of beer, 5 ounces (148 mL) of wine, or 1.5 ounces (44 mL) of liquor.  . If you are 67-75 years old, ask your health care provider if you should take aspirin to prevent strokes.  . Use sunscreen. Apply sunscreen liberally and repeatedly throughout the day. You should seek shade when your shadow is shorter than you. Protect yourself by wearing long sleeves, pants, a wide-brimmed hat, and sunglasses year round, whenever you are outdoors.  . Once a month, do a whole body skin exam, using a mirror to look at the skin on your back. Tell your health care provider of new moles, moles that have irregular borders, moles that are larger than a pencil eraser, or moles that have changed in shape or color.

## 2019-01-07 NOTE — Assessment & Plan Note (Signed)
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 Neoadjuvant chemotherapy with TCH Perjeta 6 cycles followed by Herceptin maintenance for 1 year 2. Followed by mastectomy with sentinel lymph node study 3. Followed by adjuvant radiation therapy --------------------------------------------------------------- Current treatment: Cycle5day1neoadjuvant TCH (Perjeta discontinued after cycle 1) Chemo toxicities: 1.Severe diarrhea: controlled with Questranhowever she still gets diarrhea for couple of days after each chemo. Because of this I will set her up for IV fluids 1 week after each treatment. 2.Fatigue: improved since blood transfusion 3.Leukopenia:Taxotere and Carbo were dose reduced with cycle 2. ANC today is 2.2. Okay to receive chemotherapy. 4.Nausea and vomiting:Much improved, only experienced once, and resolved with anti emetics. 5. Anemia: Received 2 units of PRBCs ,hemoglobin is slowly starting to creep downwards. Monitoring closely. 6. Peripheral neuropathy: mild and intermittent, will monitor for now 7. Hypokalemia:Monitoring closely, awaiting today's CMP. 8. H/o diverticulitis: She knows to call us if she develops symptoms.  Labs have been reviewed Emergency room visit for ataxia: MRI/MRA revealed 4 mm aneurysm in the right ICA paraophthalmic area.  Referred to [facility] neurosurgery.  Plan to get a post neoadjuvant breast MRI 01/19/2019.  She has appointments to meet with surgery and plan to undergo mastectomy with sentinel lymph node biopsy.  We will discuss her case in the breast tumor board.

## 2019-01-11 NOTE — Progress Notes (Addendum)
PCP notes:   Health maintenance:  Foot exam - postponed Microalbumin - postponed  Abnormal screenings:   None  Patient concerns:   None  Nurse concerns:  None  Next PCP appt:   N/A  Note: Addendum created to add documentation for encounter.

## 2019-01-11 NOTE — Progress Notes (Signed)
Subjective:   Katie [name] is a 73 y.o. female who presents for Medicare Annual (Subsequent) preventive examination.  Review of Systems:  N/A Cardiac Risk Factors include: advanced age (>4men, >27 women);diabetes mellitus;dyslipidemia     Objective:     Vitals: LMP 08/26/1992 (Approximate)   There is no height or weight on file to calculate BMI.  Advanced Directives 01/05/2019 11/19/2018 10/09/2018 10/09/2018 09/16/2018 11/07/2017 10/13/2017  Does Patient Have a Medical Advance Directive? Yes No Yes No Yes Yes Yes  Type of Advance Directive Living will;Healthcare Power of [place];Living will [place];Living will  Does patient want to make changes to medical advance directive? No - Patient declined - No - Patient declined - - - -  Copy of [place] in Chart? No - copy requested - No - copy requested - - No - copy requested No - copy requested  Would patient like information on creating a medical advance directive? No - Patient declined No - Patient declined No - Patient declined No - Patient declined - - -    Tobacco Social History   Tobacco Use  Smoking Status Never Smoker  Smokeless Tobacco Never Used     Counseling given: No   Clinical Intake:  Pre-visit preparation completed: Yes  Pain : No/denies pain     Nutritional Status: BMI 25 -29 Overweight Nutritional Risks: None Diabetes: No  How often do you need to have someone help you when you read instructions, pamphlets, or other written materials from your doctor or pharmacy?: 1 - Never  Interpreter Needed?: No  Information entered by :: LPinson, LPN  Past Medical History:  Diagnosis Date  . Allergy   . Anemia   . Blood transfusion without reported diagnosis   . Diverticulitis   . DJD (degenerative joint disease)   . DM type 2 (diabetes mellitus, type 2) ([place])   . Family history  of breast cancer   . Fibrocystic breast disease   . GERD (gastroesophageal reflux disease)   . History of chicken pox   . Hypercholesteremia   . Hypertension   . Lichen planopilaris   . Urinary incontinence    Past Surgical History:  Procedure Laterality Date  . BREAST SURGERY  02/1975   Left Breast Biopsy-Fibrocystic Disease  . BREAST SURGERY  10/1981   Bialteral Capsulectomy Breast Surgery  . BREAST SURGERY  07/1998   Replace Bilateral Silicone Implants  . BREAST SURGERY  01/1976   Bilater breast surgery to remove fibrocystic tissue and replace with silicone implants  . CHOLECYSTECTOMY N/A 07/08/2016   Procedure: LAPAROSCOPIC CHOLECYSTECTOMY;  Surgeon: [name], MD;  Location: [facility] ORS;  Service: General;  Laterality: N/A;  . COLONOSCOPY WITH PROPOFOL N/A 11/07/2017   Procedure: COLONOSCOPY WITH PROPOFOL;  Surgeon: Jonathon Bellows, MD;  Location: [facility] ENDOSCOPY;  Service: Gastroenterology;  Laterality: N/A;  . DILATION AND CURETTAGE OF UTERUS  01/1994  . ESOPHAGOGASTRODUODENOSCOPY (EGD) WITH PROPOFOL N/A 12/05/2016   Procedure: ESOPHAGOGASTRODUODENOSCOPY (EGD) WITH PROPOFOL;  Surgeon: Jonathon Bellows, MD;  Location: [facility] ENDOSCOPY;  Service: Endoscopy;  Laterality: N/A;  . FINGER SURGERY  2001 & 2003   Trigger Finger  . FOOT SURGERY  1980   To Relieve Pinched Nerve  . FOOT SURGERY  07/2008   Left Foot-Plantar Fasciitis  . JOINT REPLACEMENT  04/19/2013   Hip Replacement-Right  . PLACEMENT OF BREAST IMPLANTS  12/26/2004   Replace Failed  Implants  . RHINOPLASTY  03/1964   To Correct Deviated Septum   Family History  Problem Relation Age of Onset  . Arthritis Mother   . Hypertension Mother   . Heart disease Father   . Diabetes Father   . Breast cancer Sister 32       d. 59  . Hypertension Brother   . Leukemia Brother   . Coronary artery disease Brother    Social History   Socioeconomic History  . Marital status: Married    Spouse name: Not on file  . Number of children:  1  . Years of education: Not on file  . Highest education level: Not on file  Occupational History  . Occupation: Retired  Scientific laboratory technician  . Financial resource strain: Not on file  . Food insecurity:    Worry: Not on file    Inability: Not on file  . Transportation needs:    Medical: Not on file    Non-medical: Not on file  Tobacco Use  . Smoking status: Never Smoker  . Smokeless tobacco: Never Used  Substance and Sexual Activity  . Alcohol use: No    Alcohol/week: 0.0 standard drinks  . Drug use: No  . Sexual activity: Not Currently    Partners: Male  Lifestyle  . Physical activity:    Days per week: Not on file    Minutes per session: Not on file  . Stress: Not on file  Relationships  . Social connections:    Talks on phone: Not on file    Gets together: Not on file    Attends religious service: Not on file    Active member of club or organization: Not on file    Attends meetings of clubs or organizations: Not on file    Relationship status: Not on file  Other Topics Concern  . Not on file  Social History Narrative   Married for 37 years.    She has one child and one stepston.       Outpatient Encounter Medications as of 01/06/2019  Medication Sig  . acetaminophen (TYLENOL) 325 MG tablet Take 650 mg by mouth every 6 (six) hours as needed for mild pain or headache.  [name] amLODipine (NORVASC) 5 MG tablet Take 1 tablet (5 mg total) by mouth daily.  [name] atorvastatin (LIPITOR) 10 MG tablet TAKE 1 TABLET(10 MG) BY MOUTH DAILY  . Azelastine HCl 137 MCG/SPRAY SOLN instill 1 spray into each nostril twice a day for 1 week then if needed  . cephALEXin (KEFLEX) 500 MG capsule Take 1 capsule (500 mg total) by mouth 2 (two) times daily.  . cholecalciferol (VITAMIN D) 400 units TABS tablet Take 400 Units by mouth 2 (two) times daily.  . cholestyramine (QUESTRAN) 4 g packet Take 1 packet (4 g total) by mouth 3 (three) times daily with meals.  . clobetasol (TEMOVATE) 0.05 % external  solution Apply 1 application topically 2 (two) times daily as needed (scalp). Reported on 08/16/2015  . diphenoxylate-atropine (LOMOTIL) 2.5-0.025 MG tablet   . fluconazole (DIFLUCAN) 150 MG tablet Take 1 tablet for a yeast infection if needed, may repeat in 3 days if needed  . fluticasone (FLONASE) 50 MCG/ACT nasal spray INSTILL 2 SPRAYS INTO EACH NOSTRIL ONCE DAILY  . glucose blood test strip USE TO CHECK BLOOD SUGAR DAILY  . lidocaine-prilocaine (EMLA) cream Apply to affected area once (Patient taking differently: Apply 1 application topically as needed (port access). )  . loperamide (IMODIUM) 2  MG capsule Take 2 mg by mouth as needed for diarrhea or loose stools.  [name] loratadine (CLARITIN) 10 MG tablet Take 10 mg by mouth daily.  [name] LORazepam (ATIVAN) 0.5 MG tablet TAKE 1 TABLET(0.5 MG) BY MOUTH AT BEDTIME AS NEEDED FOR SLEEP  . magnesium oxide (MAG-OX) 400 (241.3 Mg) MG tablet Take 1 tablet (400 mg total) by mouth daily.  . metFORMIN (GLUCOPHAGE-XR) 500 MG 24 hr tablet Take 1 tablet (500 mg total) by mouth daily with breakfast. (Patient taking differently: Take 500 mg by mouth at bedtime. )  . metroNIDAZOLE (METROCREAM) 0.75 % cream Apply 1 application topically 2 (two) times daily as needed (rosacea).   . Multiple Vitamin (MULTIVITAMIN WITH MINERALS) TABS tablet Take 1 tablet by mouth daily.  . Olopatadine HCl 0.2 % SOLN 1 drop per eye daily  . ondansetron (ZOFRAN) 8 MG tablet Take 1 tablet (8 mg total) by mouth 2 (two) times daily as needed (Nausea or vomiting). Begin 4 days after chemotherapy.  . prochlorperazine (COMPAZINE) 10 MG tablet Take 1 tablet (10 mg total) by mouth every 6 (six) hours as needed (Nausea or vomiting).  . triamcinolone cream (KENALOG) 0.1 % Apply 1 application topically 2 (two) times daily. (Patient taking differently: Apply 1 application topically 2 (two) times daily as needed (skin rashes). )  . valACYclovir (VALTREX) 500 MG tablet Take 1 tablet (500 mg total) by mouth  2 (two) times daily. (Patient taking differently: Take 500 mg by mouth as needed (flare up). )   No facility-administered encounter medications on file as of 01/06/2019.     Activities of Daily Living In your present state of health, do you have any difficulty performing the following activities: 01/06/2019 10/09/2018  Hearing? N N  Vision? N N  Difficulty concentrating or making decisions? N N  Walking or climbing stairs? N N  Dressing or bathing? N N  Doing errands, shopping? N N  Preparing Food and eating ? N -  Using the Toilet? N -  In the past six months, have you accidently leaked urine? N -  Do you have problems with loss of bowel control? N -  Managing your Medications? N -  Managing your Finances? N -  Housekeeping or managing your Housekeeping? N -  Some recent data might be hidden    Patient Care Team: [name], MD as PCP - General (Family Medicine) Birder [name], MD as Referring Physician (Ophthalmology) [name], MD as Consulting Physician (Dermatology) [name], MD as Consulting Physician (Orthopedic Surgery) [name], MD as Consulting Physician (General Surgery) [name], MD as Consulting Physician (Dentistry) [name], MD as Consulting Physician (General Surgery) [name], MD as Consulting Physician (Hematology and Oncology) [name], MD as Attending Physician (Radiation Oncology)    Assessment:   This is a routine wellness examination for Malone.   Exercise Activities and Dietary recommendations Exercise limited by: None identified  Goals    . Patient Stated     Starting 01/06/2019, I will continue to take medications as prescribed.        Fall Risk Fall Risk  01/06/2019 10/13/2017 10/08/2016 10/06/2015 10/04/2014  Falls in the past year? 0 No No No Yes  Number falls in past yr: - - - - 1  Injury with Fall? - - - - No   IDepression Screen PHQ 2/9 Scores 01/06/2019 10/13/2017 10/08/2016 10/06/2015   PHQ - 2 Score 0 0 0 0  PHQ- 9 Score 0 0 - -  Cognitive Function MMSE - Mini Mental State Exam 01/06/2019 10/13/2017 10/08/2016  Orientation to time 5 5 5   Orientation to Place 5 5 5   Registration 3 3 3   Attention/ Calculation 0 0 0  Recall 3 3 3   Language- name 2 objects 0 0 0  Language- repeat 1 1 1   Language- follow 3 step command 0 3 3  Language- read & follow direction 0 0 0  Write a sentence 0 0 0  Copy design 0 0 0  Total score 17 20 20      PLEASE NOTE: A Mini-Cog screen was completed. Maximum score is 17. A value of 0 denotes this part of [place] MMSE was not completed or the patient failed this part of the Mini-Cog screening.   Mini-Cog Screening Orientation to Time - Max 5 pts Orientation to Place - Max 5 pts Registration - Max 3 pts Recall - Max 3 pts Language Repeat - Max 1 pts      Immunization History  Administered Date(s) Administered  . Hep A / Hep B 05/29/2009, 07/03/2009, 11/24/2009  . Influenza, High Dose Seasonal PF 04/18/2014, 04/13/2015, 04/30/2018  . Influenza, Seasonal, Injecte, Preservative Fre 06/26/2016  . Influenza-Unspecified 05/17/2009, 04/02/2011, 05/20/2012, 06/02/2013, 07/01/2013, 04/18/2014, 04/13/2015, 06/27/2016, 04/11/2017  . Pneumococcal Conjugate-13 04/18/2014  . Pneumococcal Polysaccharide-23 10/06/2015  . Pneumococcal-Unspecified 04/18/2014  . Td 05/17/2009  . Typhoid Inactivated 05/17/2009  . Yellow Fever 05/17/2009  . Zoster 04/02/2011    Screening Tests Health Maintenance  Topic Date Due  . FOOT EXAM  01/06/2020 (Originally 10/17/2018)  . URINE MICROALBUMIN  01/06/2020 (Originally 12/04/1955)  . INFLUENZA VACCINE  03/27/2019  . HEMOGLOBIN A1C  03/30/2019  . TETANUS/TDAP  05/18/2019  . MAMMOGRAM  09/01/2019  . OPHTHALMOLOGY EXAM  09/25/2019  . DEXA SCAN  08/31/2020  . COLONOSCOPY  11/08/2022  . Hepatitis C Screening  Completed  . PNA vac Low Risk Adult  Completed      Plan:     I have personally reviewed,  addressed, and noted the following in the patient's chart:  A. Medical and social history B. Use of alcohol, tobacco or illicit drugs  C. Current medications and supplements D. Functional ability and status E.  Nutritional status F.  Physical activity G. Advance directives H. List of other physicians I.  Hospitalizations, surgeries, and ER visits in previous 12 months J.  Vitals (unless it is a telemedicine encounter) K. Screenings to include cognitive, depression, hearing, vision (NOTE: hearing and vision screenings not completed in telemedicine encounter) L. Referrals and appointments   In addition, I have reviewed and discussed with patient certain preventive protocols, quality metrics, and best practice recommendations. A written personalized care plan for preventive services and recommendations were provided to patient.  With patient's permission, we connected on 01/11/19 at  9:30 AM EDT by a video enabled telemedicine application. Two patient identifiers were used to ensure the encounter occurred with the correct person.    Patient was in home and writer was in office.   Signed,   Lindell Noe, MHA, BS, LPN Health Coach

## 2019-01-12 ENCOUNTER — Other Ambulatory Visit: Payer: Medicare Other

## 2019-01-12 ENCOUNTER — Ambulatory Visit: Payer: Medicare Other | Admitting: Adult Health

## 2019-01-12 ENCOUNTER — Encounter: Payer: Medicare Other | Admitting: Family Medicine

## 2019-01-12 NOTE — Progress Notes (Signed)
I reviewed health advisor's note, was available for consultation, and agree with documentation and plan.   Signed,  Verma Grothaus T. Mavric Cortright, MD  

## 2019-01-12 NOTE — Progress Notes (Signed)
Patient Care Team: [name], MD as PCP - General (Family Medicine) Birder [name], MD as Referring Physician (Ophthalmology) [name], MD as Consulting Physician (Dermatology) [name], MD as Consulting Physician (Orthopedic Surgery) [name], MD as Consulting Physician (General Surgery) [name], MD as Consulting Physician (Dentistry) [name], MD as Consulting Physician (General Surgery) [name], MD as Consulting Physician (Hematology and Oncology) [name], MD as Attending Physician (Radiation Oncology)  DIAGNOSIS:    ICD-10-CM   1. Malignant neoplasm of upper-outer quadrant of left breast in female, estrogen receptor positive ([place]) C50.412 DISCONTINUED: fosaprepitant (EMEND) 150 mg in sodium chloride 0.9 % 145 mL IVPB   Z17.0     SUMMARY OF ONCOLOGIC HISTORY:   Malignant neoplasm of upper-outer quadrant of left breast in female, estrogen receptor positive ([place])   09/14/2018 Initial Diagnosis    With prior history of breast implants that were ruptured/replaced, patient had cellulitis LIQ left breast for 1 week, and UOQ asymmetry 1.5 cm, MRI right breast biopsy benign, left breast numerous masses 7 cm skin thickening: 2 biopsies from left breast: Grade 2-3 IDC ER PR positive, HER-2 positive, Ki-67 50%    09/16/2018 Cancer Staging    Staging form: Breast, AJCC 8th Edition - Clinical stage from 09/16/2018: Stage IIIB (cT4, cN0, cM0, G3, ER+, PR+, HER2+) - Signed by [name], MD on 09/16/2018    09/29/2018 -  Neo-Adjuvant Chemotherapy    TCHP q 3 weeks     CHIEF COMPLIANT: Cycle 6 TCH  INTERVAL HISTORY: Katie [name] is a 73 y.o. with above-mentioned history of left breast cancer currently receiving neoadjuvant chemotherapy with TCH, as Perjeta was discontinued after the first cycle. On 01/05/19 she presented to the ED for dizziness and difficulty ambulating. 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 She was started on Keflex for a UTI. Follow-up was scheduled with [facility]surgery at [facility] She presents to the clinic today for cycle 6.  She is complaining of profound fatigue and shortness of breath to minimal exertion.  She no longer has the dizziness or lightheadedness.  REVIEW OF SYSTEMS:   Constitutional: Denies fevers, chills or abnormal weight loss Eyes: Denies blurriness of vision Ears, nose, mouth, throat, and face: Denies mucositis or sore throat Respiratory: Denies cough, dyspnea or wheezes Cardiovascular: Denies palpitation, chest discomfort Gastrointestinal: Denies nausea, heartburn or change in bowel habits Skin: Denies abnormal skin rashes Lymphatics: Denies new lymphadenopathy or easy bruising Neurological: Mild peripheral neuropathy Behavioral/Psych: Mood is stable, no new changes  Extremities: No lower extremity edema Breast: denies any pain or lumps or nodules in either breasts All other systems were reviewed with the patient and are negative.  I have reviewed the past medical history, past surgical history, social history and family history with the patient and they are unchanged from previous note.  ALLERGIES:  is allergic to erythromycin; ciprofloxacin; daypro [oxaprozin]; enalapril maleate; nsaids; and vioxx [rofecoxib].  MEDICATIONS:  Current Outpatient Medications  Medication Sig Dispense Refill   amLODipine (NORVASC) 5 MG tablet Take 1 tablet (5 mg total) by mouth daily. 30 tablet 11   atorvastatin (LIPITOR) 10 MG tablet TAKE 1 TABLET(10 MG) BY MOUTH DAILY 90 tablet 0   Azelastine HCl 137 MCG/SPRAY SOLN instill 1 spray into each nostril twice a day for 1 week then if needed 30 mL 5   cholecalciferol (VITAMIN D) 400 units TABS tablet Take 400 Units by mouth 2 (two) times daily.     cholestyramine (Lucrezia Starch  4 g packet Take 1 packet (4 g total) by mouth 3 (three) times daily with meals. 60 each 12   clobetasol (TEMOVATE) 0.05 % external solution Apply  1 application topically 2 (two) times daily as needed (scalp). Reported on 08/16/2015     diphenoxylate-atropine (LOMOTIL) 2.5-0.025 MG tablet      fluticasone (FLONASE) 50 MCG/ACT nasal spray INSTILL 2 SPRAYS INTO EACH NOSTRIL ONCE DAILY 16 g 5   glucose blood test strip USE TO CHECK BLOOD SUGAR DAILY 50 each 12   lidocaine-prilocaine (EMLA) cream Apply to affected area once (Patient taking differently: Apply 1 application topically as needed (port access). ) 30 g 3   loperamide (IMODIUM) 2 MG capsule Take 2 mg by mouth as needed for diarrhea or loose stools.     loratadine (CLARITIN) 10 MG tablet Take 10 mg by mouth daily.     LORazepam (ATIVAN) 0.5 MG tablet TAKE 1 TABLET(0.5 MG) BY MOUTH AT BEDTIME AS NEEDED FOR SLEEP 30 tablet 0   magnesium oxide (MAG-OX) 400 (241.3 Mg) MG tablet Take 1 tablet (400 mg total) by mouth daily. 30 tablet 0   metFORMIN (GLUCOPHAGE-XR) 500 MG 24 hr tablet Take 1 tablet (500 mg total) by mouth daily with breakfast. (Patient taking differently: Take 500 mg by mouth at bedtime. ) 90 tablet 3   metroNIDAZOLE (METROCREAM) 0.75 % cream Apply 1 application topically 2 (two) times daily as needed (rosacea).      Multiple Vitamin (MULTIVITAMIN WITH MINERALS) TABS tablet Take 1 tablet by mouth daily.     Olopatadine HCl 0.2 % SOLN 1 drop per eye daily 2.5 mL 0   ondansetron (ZOFRAN) 8 MG tablet Take 1 tablet (8 mg total) by mouth 2 (two) times daily as needed (Nausea or vomiting). Begin 4 days after chemotherapy. 30 tablet 1   prochlorperazine (COMPAZINE) 10 MG tablet Take 1 tablet (10 mg total) by mouth every 6 (six) hours as needed (Nausea or vomiting). 30 tablet 1   triamcinolone cream (KENALOG) 0.1 % Apply 1 application topically 2 (two) times daily. (Patient taking differently: Apply 1 application topically 2 (two) times daily as needed (skin rashes). ) 15 g 0   valACYclovir (VALTREX) 500 MG tablet Take 1 tablet (500 mg total) by mouth 2 (two) times daily.  (Patient taking differently: Take 500 mg by mouth as needed (flare up). ) 6 tablet 2   No current facility-administered medications for this visit.    Facility-Administered Medications Ordered in Other Visits  Medication Dose Route Frequency Provider Last Rate Last Dose   0.9 %  sodium chloride infusion   Intravenous Once [name], MD       acetaminophen (TYLENOL) tablet 650 mg  650 mg Oral Once [name], MD       CARBOplatin (PARAPLATIN) 420 mg in sodium chloride 0.9 % 250 mL chemo infusion  420 mg Intravenous Once [name], MD       diphenhydrAMINE (BENADRYL) capsule 50 mg  50 mg Oral Once [name], MD       fosaprepitant (EMEND) 150 mg in sodium chloride 0.9 % 145 mL IVPB   Intravenous Once [name], MD       heparin lock flush 100 unit/mL  500 Units Intracatheter Once PRN [name], MD       palonosetron (ALOXI) injection 0.25 mg  0.25 mg Intravenous Once [name], MD       sodium chloride flush (NS) 0.9 % injection 10 mL  10 mL  Intracatheter PRN [name], MD       trastuzumab (HERCEPTIN) 462 mg in sodium chloride 0.9 % 250 mL chemo infusion  6 mg/kg (Treatment Plan Recorded) Intravenous Once [name], MD        PHYSICAL EXAMINATION: ECOG PERFORMANCE STATUS: 1 - Symptomatic but completely ambulatory  Vitals:   01/13/19 0922  BP: (!) 155/59  Pulse: 83  Resp: 17  Temp: 98.1 F (36.7 C)  SpO2: 100%   Filed Weights   01/13/19 0922  Weight: 154 lb 14.4 oz (70.3 kg)    GENERAL: alert, no distress and comfortable SKIN: skin color, texture, turgor are normal, no rashes or significant lesions EYES: normal, Conjunctiva are pink and non-injected, sclera clear OROPHARYNX: no exudate, no erythema and lips, buccal mucosa, and tongue normal  NECK: supple, thyroid normal size, non-tender, without nodularity LYMPH: no palpable lymphadenopathy in the cervical, axillary or inguinal LUNGS: clear to auscultation and percussion with normal  breathing effort HEART: regular rate & rhythm and no murmurs and no lower extremity edema ABDOMEN: abdomen soft, non-tender and normal bowel sounds MUSCULOSKELETAL: no cyanosis of digits and no clubbing  NEURO: alert & oriented x 3 with fluent speech, no focal motor/sensory deficits EXTREMITIES: No lower extremity edema  LABORATORY DATA:  I have reviewed the data as listed CMP Latest Ref Rng & Units 01/13/2019 01/05/2019 12/30/2018  Glucose 70 - 99 mg/dL 104(H) 154(H) 175(H)  BUN 8 - 23 mg/dL 15 13 12   Creatinine 0.44 - 1.00 mg/dL 0.77 0.73 0.78  Sodium 135 - 145 mmol/L 141 139 139  Potassium 3.5 - 5.1 mmol/L 4.0 3.1(L) 3.5  Chloride 98 - 111 mmol/L 109 108 106  CO2 22 - 32 mmol/L 23 21(L) 23  Calcium 8.9 - 10.3 mg/dL 9.0 8.4(L) 8.6(L)  Total Protein 6.5 - 8.1 g/dL 6.3(L) - 6.1(L)  Total Bilirubin 0.3 - 1.2 mg/dL 0.5 - 0.7  Alkaline Phos 38 - 126 U/L 64 - 73  AST 15 - 41 U/L 17 - 13(L)  ALT 0 - 44 U/L 16 - 11    Lab Results  Component Value Date   WBC 3.7 (L) 01/13/2019   HGB 8.9 (L) 01/13/2019   HCT 26.6 (L) 01/13/2019   MCV 114.2 (H) 01/13/2019   PLT 100 (L) 01/13/2019   NEUTROABS 2.0 01/13/2019    ASSESSMENT & PLAN:  Malignant neoplasm of upper-outer quadrant of left breast in female, estrogen receptor positive ([place]) With prior history of breast implants that were ruptured/replaced, patient had cellulitis LIQ left breast for 1 week, and UOQ asymmetry 1.5 cm, MRI right breast biopsy benign, left breast numerous masses 7 cm skin thickening: 2 biopsies from left breast: Grade 2-3 IDC ER PR positive, HER-2 positive, Ki-67 50% Clinical stage: T4 N0 M0 stage IIIb  Treatment plan 1. Neoadjuvant chemotherapy with TCH Perjeta 6 cycles followed by Herceptin maintenance for 1 year 2. Followed by mastectomy with sentinel lymph node study 3. Followed by adjuvant radiation therapy --------------------------------------------------------------- Current treatment:  Cycle6day1neoadjuvant TCH (Perjeta discontinued after cycle 1) Chemo toxicities: 1.Severe diarrhea: controlled with Questranhowever she still gets diarrhea for couple of days after each chemo. Because of this I will set her up for IV fluids 1 week after each treatment. 2.Fatigue: improved since blood transfusion 3.Leukopenia:Taxotere and Carbo were dose reduced with cycle 2. ANC today is 2. Okay to receive chemotherapy. 4.Nausea and vomiting:Much improved, only experienced once, and resolved with anti emetics. 5. Anemia: Received 2 units of PRBCs Monitoring closely. 6. Peripheral  neuropathy: mild and intermittent, will monitor for now 7. Hypokalemia:Monitoring closely, awaiting today's CMP. 8. H/o diverticulitis: She knows to call us if she develops symptoms.  We discontinued Taxotere today. We will also discontinue Neulasta/Udenyca She will come next week for IV fluids and lab check.  Labs have been reviewed Emergency room visit for ataxia: MRI/MRA revealed 4 mm aneurysm in the right ICA paraophthalmic area.  Referred to [facility]surgery.  Plan to get a post neoadjuvant breast MRI 01/19/2019.  She has appointments to meet with surgery and plan to undergo mastectomy with sentinel lymph node biopsy.  We will discuss her case in the breast tumor board.  I will see the patient back after surgery through a virtual visit using D[employer]ity  No orders of the defined types were placed in this encounter.  The patient has a good understanding of the overall plan. she agrees with it. she will call with any problems that may develop before the next visit here.  [name], MD 01/13/2019  Julious Oka Dorshimer am acting as scribe for Dr. [name].  I have reviewed the above documentation for accuracy and completeness, and I agree with the above.

## 2019-01-13 ENCOUNTER — Inpatient Hospital Stay: Payer: Medicare Other

## 2019-01-13 ENCOUNTER — Other Ambulatory Visit: Payer: Self-pay

## 2019-01-13 ENCOUNTER — Ambulatory Visit (HOSPITAL_BASED_OUTPATIENT_CLINIC_OR_DEPARTMENT_OTHER): Payer: Medicare Other | Admitting: Medical

## 2019-01-13 ENCOUNTER — Ambulatory Visit: Payer: Medicare Other

## 2019-01-13 ENCOUNTER — Inpatient Hospital Stay (HOSPITAL_BASED_OUTPATIENT_CLINIC_OR_DEPARTMENT_OTHER): Payer: Medicare Other | Admitting: Hematology and Oncology

## 2019-01-13 VITALS — BP 132/49 | HR 93 | Temp 97.9°F | Resp 18

## 2019-01-13 DIAGNOSIS — G620 Drug-induced polyneuropathy: Secondary | ICD-10-CM | POA: Diagnosis not present

## 2019-01-13 DIAGNOSIS — Z170 Estrogen receptor positive status [ER+]: Secondary | ICD-10-CM

## 2019-01-13 DIAGNOSIS — C50412 Malignant neoplasm of upper-outer quadrant of left female breast: Secondary | ICD-10-CM

## 2019-01-13 DIAGNOSIS — D6481 Anemia due to antineoplastic chemotherapy: Secondary | ICD-10-CM

## 2019-01-13 DIAGNOSIS — R232 Flushing: Secondary | ICD-10-CM

## 2019-01-13 DIAGNOSIS — E876 Hypokalemia: Secondary | ICD-10-CM

## 2019-01-13 DIAGNOSIS — R42 Dizziness and giddiness: Secondary | ICD-10-CM

## 2019-01-13 DIAGNOSIS — Z95828 Presence of other vascular implants and grafts: Secondary | ICD-10-CM

## 2019-01-13 DIAGNOSIS — Z79899 Other long term (current) drug therapy: Secondary | ICD-10-CM | POA: Diagnosis not present

## 2019-01-13 LAB — CMP (CANCER CENTER ONLY)
ALT: 16 U/L (ref 0–44)
AST: 17 U/L (ref 15–41)
Albumin: 3.6 g/dL (ref 3.5–5.0)
Alkaline Phosphatase: 64 U/L (ref 38–126)
Anion gap: 9 (ref 5–15)
BUN: 15 mg/dL (ref 8–23)
CO2: 23 mmol/L (ref 22–32)
Calcium: 9 mg/dL (ref 8.9–10.3)
Chloride: 109 mmol/L (ref 98–111)
Creatinine: 0.77 mg/dL (ref 0.44–1.00)
GFR, Est AFR Am: >60 mL/min (ref >60)
GFR, Estimated: >60 mL/min (ref >60)
Glucose, Bld: 104 mg/dL — ABNORMAL HIGH (ref 70–99)
Potassium: 4 mmol/L (ref 3.5–5.1)
Sodium: 141 mmol/L (ref 135–145)
Total Bilirubin: 0.5 mg/dL (ref 0.3–1.2)
Total Protein: 6.3 g/dL — ABNORMAL LOW (ref 6.5–8.1)

## 2019-01-13 LAB — CBC WITH DIFFERENTIAL (CANCER CENTER ONLY)
Abs Immature Granulocytes: 0.01 10*3/uL (ref 0.00–0.07)
Basophils Absolute: 0 10*3/uL (ref 0.0–0.1)
Basophils Relative: 1 %
Eosinophils Absolute: 0 10*3/uL (ref 0.0–0.5)
Eosinophils Relative: 0 %
HCT: 26.6 % — ABNORMAL LOW (ref 36.0–46.0)
Hemoglobin: 8.9 g/dL — ABNORMAL LOW (ref 12.0–15.0)
Immature Granulocytes: 0 %
Lymphocytes Relative: 33 %
Lymphs Abs: 1.2 10*3/uL (ref 0.7–4.0)
MCH: 38.2 pg — ABNORMAL HIGH (ref 26.0–34.0)
MCHC: 33.5 g/dL (ref 30.0–36.0)
MCV: 114.2 fL — ABNORMAL HIGH (ref 80.0–100.0)
Monocytes Absolute: 0.4 10*3/uL (ref 0.1–1.0)
Monocytes Relative: 12 %
Neutro Abs: 2 10*3/uL (ref 1.7–7.7)
Neutrophils Relative %: 54 %
Platelet Count: 100 10*3/uL — ABNORMAL LOW (ref 150–400)
RBC: 2.33 MIL/uL — ABNORMAL LOW (ref 3.87–5.11)
RDW: 20.7 % — ABNORMAL HIGH (ref 11.5–15.5)
WBC Count: 3.7 10*3/uL — ABNORMAL LOW (ref 4.0–10.5)
nRBC: 0 % (ref 0.0–0.2)

## 2019-01-13 LAB — GLUCOSE, CAPILLARY: Glucose-Capillary: 126 mg/dL — ABNORMAL HIGH (ref 70–99)

## 2019-01-13 MED ORDER — TRASTUZUMAB CHEMO 150 MG IV SOLR
450.0000 mg | Freq: Once | INTRAVENOUS | Status: AC
  Administered 2019-01-13: 11:00:00 450 mg via INTRAVENOUS
  Filled 2019-01-13: qty 21.43

## 2019-01-13 MED ORDER — HEPARIN SOD (PORK) LOCK FLUSH 100 UNIT/ML IV SOLN
500.0000 [IU] | Freq: Once | INTRAVENOUS | Status: AC | PRN
  Administered 2019-01-13: 13:00:00 500 [IU]
  Filled 2019-01-13: qty 5

## 2019-01-13 MED ORDER — SODIUM CHLORIDE 0.9 % IV SOLN
420.5000 mg | Freq: Once | INTRAVENOUS | Status: AC
  Administered 2019-01-13: 12:00:00 420 mg via INTRAVENOUS
  Filled 2019-01-13: qty 42

## 2019-01-13 MED ORDER — DIPHENHYDRAMINE HCL 25 MG PO CAPS
ORAL_CAPSULE | ORAL | Status: AC
  Filled 2019-01-13: qty 2

## 2019-01-13 MED ORDER — ACETAMINOPHEN 325 MG PO TABS
650.0000 mg | ORAL_TABLET | Freq: Once | ORAL | Status: AC
  Administered 2019-01-13: 10:00:00 650 mg via ORAL

## 2019-01-13 MED ORDER — DIPHENHYDRAMINE HCL 25 MG PO CAPS
50.0000 mg | ORAL_CAPSULE | Freq: Once | ORAL | Status: AC
  Administered 2019-01-13: 10:00:00 50 mg via ORAL

## 2019-01-13 MED ORDER — SODIUM CHLORIDE 0.9% FLUSH
10.0000 mL | INTRAVENOUS | Status: DC | PRN
  Administered 2019-01-13: 13:00:00 10 mL
  Filled 2019-01-13: qty 10

## 2019-01-13 MED ORDER — PALONOSETRON HCL INJECTION 0.25 MG/5ML
INTRAVENOUS | Status: AC
  Filled 2019-01-13: qty 5

## 2019-01-13 MED ORDER — SODIUM CHLORIDE 0.9 % IV SOLN
Freq: Once | INTRAVENOUS | Status: AC
  Administered 2019-01-13: 10:00:00 via INTRAVENOUS
  Filled 2019-01-13: qty 250

## 2019-01-13 MED ORDER — FAMOTIDINE IN NACL 20-0.9 MG/50ML-% IV SOLN
20.0000 mg | Freq: Once | INTRAVENOUS | Status: AC
  Administered 2019-01-13: 13:00:00 20 mg via INTRAVENOUS

## 2019-01-13 MED ORDER — SODIUM CHLORIDE 0.9 % IV SOLN
Freq: Once | INTRAVENOUS | Status: AC
  Administered 2019-01-13: 11:00:00 via INTRAVENOUS
  Filled 2019-01-13: qty 5

## 2019-01-13 MED ORDER — SODIUM CHLORIDE 0.9% FLUSH
10.0000 mL | Freq: Once | INTRAVENOUS | Status: AC
  Administered 2019-01-13: 10 mL
  Filled 2019-01-13: qty 10

## 2019-01-13 MED ORDER — PALONOSETRON HCL INJECTION 0.25 MG/5ML
0.2500 mg | Freq: Once | INTRAVENOUS | Status: AC
  Administered 2019-01-13: 10:00:00 0.25 mg via INTRAVENOUS

## 2019-01-13 MED ORDER — ACETAMINOPHEN 325 MG PO TABS
ORAL_TABLET | ORAL | Status: AC
  Filled 2019-01-13: qty 2

## 2019-01-13 NOTE — Patient Instructions (Signed)
[facility] Discharge Instructions for Patients Receiving Chemotherapy  Today you received the following chemotherapy agents: Herceptin, Carboplatin.   To help prevent nausea and vomiting after your treatment, we encourage you to take your nausea medication as directed.    If you develop nausea and vomiting that is not controlled by your nausea medication, call the clinic.   BELOW ARE SYMPTOMS THAT SHOULD BE REPORTED IMMEDIATELY:  *FEVER GREATER THAN 100.5 F  *CHILLS WITH OR WITHOUT FEVER  NAUSEA AND VOMITING THAT IS NOT CONTROLLED WITH YOUR NAUSEA MEDICATION  *UNUSUAL SHORTNESS OF BREATH  *UNUSUAL BRUISING OR BLEEDING  TENDERNESS IN MOUTH AND THROAT WITH OR WITHOUT PRESENCE OF ULCERS  *URINARY PROBLEMS  *BOWEL PROBLEMS  UNUSUAL RASH Items with * indicate a potential emergency and should be followed up as soon as possible.  Feel free to call the clinic should you have any questions or concerns. The clinic phone number is [phone].  Please show the [place] at check-in to the Emergency Department and triage nurse.

## 2019-01-13 NOTE — Progress Notes (Signed)
Katie [name] was seen in the infusion room today after she completed her chemotherapy.  She was up to go to the bathroom when she reported that she was having dizziness.  It was also noted that she had facial flushing.  Her vitals returned with a blood pressure of 132/49, pulse 93, temperature 97.9 and oxygen saturation of 96% on room air.  She also had a fingerstick glucose completed which returned at 126.  She was given Pepcid 20 mg IV x1 with resolution of her symptoms.  She was ultimately discharged home.  Sandi Mealy, MHS, PA-C Physician Assistant

## 2019-01-13 NOTE — Progress Notes (Signed)
1226--Pt stated she was feeling dizzy after going to bathroom.  Also stated that her face felt warm.  Pt had observable redness and some swelling. Pt stated she thought her blood sugar was low.  Finger stick check was 126. VS checked and stable. Sandi Mealy, PA notified and at bedside. Pepcid given (see MAR). Redness and swelling on face started to diminish. Pt stated she did feel a little better. Pt walked to bathroom with standby assist.  Per Sandi Mealy, PA, pt can go home after being observed for 15-20 minutes and advised to take benedryl at home if symptoms present again.   1315--Swelling in face has decreased to the point that it is not seen.  Redness is still there, but not as pronounced. Pt stated she feels comfortable going home.  Given AVS with number to cancer center if she has any questions or concerns.  Advised to go to the Emergency Department if symptoms become worse.

## 2019-01-14 ENCOUNTER — Telehealth: Payer: Self-pay | Admitting: *Deleted

## 2019-01-14 NOTE — Telephone Encounter (Signed)
Called pt to congratulate on completing chemo on 01/13/19. Relate doing well, denies n/v. Confirmed future appts. Discussed next steps in care. Denies questions or needs at this time. Encourage pt to call with concerns. Received verbal understanding.

## 2019-01-15 ENCOUNTER — Ambulatory Visit: Payer: Medicare Other

## 2019-01-19 ENCOUNTER — Other Ambulatory Visit: Payer: Self-pay

## 2019-01-19 ENCOUNTER — Ambulatory Visit
Admission: RE | Admit: 2019-01-19 | Discharge: 2019-01-19 | Disposition: A | Discharge status: Home / Self Care | Payer: Medicare Other | Source: Ambulatory Visit | Attending: Hematology and Oncology | Admitting: Hematology and Oncology

## 2019-01-19 DIAGNOSIS — C50412 Malignant neoplasm of upper-outer quadrant of left female breast: Secondary | ICD-10-CM

## 2019-01-19 MED ORDER — GADOBUTROL 1 MMOL/ML IV SOLN
7.0000 mL | Freq: Once | INTRAVENOUS | Status: AC | PRN
  Administered 2019-01-19: 7 mL via INTRAVENOUS

## 2019-01-19 NOTE — Progress Notes (Signed)
Patient Care Team: [name], MD as PCP - General (Family Medicine) Birder [name], MD as Referring Physician (Ophthalmology) [name], MD as Consulting Physician (Dermatology) [name], MD as Consulting Physician (Orthopedic Surgery) [name], MD as Consulting Physician (General Surgery) [name], MD as Consulting Physician (Dentistry) [name], MD as Consulting Physician (General Surgery) [name], MD as Consulting Physician (Hematology and Oncology) [name], MD as Attending Physician (Radiation Oncology)  DIAGNOSIS:    ICD-10-CM   1. Malignant neoplasm of upper-outer quadrant of left breast in female, estrogen receptor positive ([place]) C50.412    Z17.0     SUMMARY OF ONCOLOGIC HISTORY:   Malignant neoplasm of upper-outer quadrant of left breast in female, estrogen receptor positive ([place])   09/14/2018 Initial Diagnosis    With prior history of breast implants that were ruptured/replaced, patient had cellulitis LIQ left breast for 1 week, and UOQ asymmetry 1.5 cm, MRI right breast biopsy benign, left breast numerous masses 7 cm skin thickening: 2 biopsies from left breast: Grade 2-3 IDC ER PR positive, HER-2 positive, Ki-67 50%    09/16/2018 Cancer Staging    Staging form: Breast, AJCC 8th Edition - Clinical stage from 09/16/2018: Stage IIIB (cT4, cN0, cM0, G3, ER+, PR+, HER2+) - Signed by [name], MD on 09/16/2018    09/29/2018 -  Neo-Adjuvant Chemotherapy    TCHP q 3 weeks    01/19/2019 Breast MRI    Significant interval improvement, the multiple left breast masses and areas of non mass enhancement in all 4 quadrants have decreased in size and extent. The mass previously measuring 1.4 cm with diffuse enhancement now measures 9 mm with heterogeneous areas of enhancement. Current greatest transverse dimension is 5.4 x 3.9 cm, previously 7 cm x 5cm. 2. 4 mm masslike focus of enhancement at the base of the right nipple is  unchanged.     CHIEF COMPLIANT: Follow-up after chemotherapy to discuss MRI results and future treatment  INTERVAL HISTORY: Katie [name] is a 73 y.o. with above-mentioned history of left breast cancer who completed neoadjuvant chemotherapy with TCHP. Breast MRI on 01/19/2019 revealed significant interval improvement in the breast mass and area of non-mass enhancement. The mass previously measuring 1.4cm is now 0.9cm, and the non-mass enhancement previously measuring 7cm x 5cm is now 5.4cm x 3.9cm. The 2.4cm area of mass-like focus in the base of the right nipple is unchanged. She presents to the clinic today to review the results of her MRI and discuss further treatment.  She is feeling very tired and shortness of breath is along with the dizziness and lightheadedness.  REVIEW OF SYSTEMS:   Constitutional: Denies fevers, chills or abnormal weight loss Eyes: Denies blurriness of vision Ears, nose, mouth, throat, and face: Denies mucositis or sore throat Respiratory: Denies cough, dyspnea or wheezes Cardiovascular: Denies palpitation, chest discomfort Gastrointestinal: Denies nausea, heartburn or change in bowel habits Skin: Denies abnormal skin rashes Lymphatics: Denies new lymphadenopathy or easy bruising Neurological: Denies numbness, tingling or new weaknesses Behavioral/Psych: Mood is stable, no new changes  Extremities: No lower extremity edema Breast: denies any pain or lumps or nodules in either breasts All other systems were reviewed with the patient and are negative.  I have reviewed the past medical history, past surgical history, social history and family history with the patient and they are unchanged from previous note.  ALLERGIES:  is allergic to erythromycin; ciprofloxacin; daypro [oxaprozin]; enalapril maleate; nsaids; and vioxx [rofecoxib].  MEDICATIONS:  Current Outpatient  Medications  Medication Sig Dispense Refill  . amLODipine (NORVASC) 5 MG tablet Take 1 tablet  (5 mg total) by mouth daily. 30 tablet 11  . atorvastatin (LIPITOR) 10 MG tablet TAKE 1 TABLET(10 MG) BY MOUTH DAILY 90 tablet 0  . Azelastine HCl 137 MCG/SPRAY SOLN instill 1 spray into each nostril twice a day for 1 week then if needed 30 mL 5  . cholecalciferol (VITAMIN D) 400 units TABS tablet Take 400 Units by mouth 2 (two) times daily.    . cholestyramine (QUESTRAN) 4 g packet Take 1 packet (4 g total) by mouth 3 (three) times daily with meals. 60 each 12  . clobetasol (TEMOVATE) 0.05 % external solution Apply 1 application topically 2 (two) times daily as needed (scalp). Reported on 08/16/2015    . diphenoxylate-atropine (LOMOTIL) 2.5-0.025 MG tablet     . fluticasone (FLONASE) 50 MCG/ACT nasal spray INSTILL 2 SPRAYS INTO EACH NOSTRIL ONCE DAILY 16 g 5  . glucose blood test strip USE TO CHECK BLOOD SUGAR DAILY 50 each 12  . lidocaine-prilocaine (EMLA) cream Apply to affected area once (Patient taking differently: Apply 1 application topically as needed (port access). ) 30 g 3  . loperamide (IMODIUM) 2 MG capsule Take 2 mg by mouth as needed for diarrhea or loose stools.    [name] loratadine (CLARITIN) 10 MG tablet Take 10 mg by mouth daily.    [name] LORazepam (ATIVAN) 0.5 MG tablet TAKE 1 TABLET(0.5 MG) BY MOUTH AT BEDTIME AS NEEDED FOR SLEEP 30 tablet 0  . magnesium oxide (MAG-OX) 400 (241.3 Mg) MG tablet Take 1 tablet (400 mg total) by mouth daily. 30 tablet 0  . metFORMIN (GLUCOPHAGE-XR) 500 MG 24 hr tablet Take 1 tablet (500 mg total) by mouth daily with breakfast. (Patient taking differently: Take 500 mg by mouth at bedtime. ) 90 tablet 3  . metroNIDAZOLE (METROCREAM) 0.75 % cream Apply 1 application topically 2 (two) times daily as needed (rosacea).     . Multiple Vitamin (MULTIVITAMIN WITH MINERALS) TABS tablet Take 1 tablet by mouth daily.    . Olopatadine HCl 0.2 % SOLN 1 drop per eye daily 2.5 mL 0  . ondansetron (ZOFRAN) 8 MG tablet Take 1 tablet (8 mg total) by mouth 2 (two) times daily  as needed (Nausea or vomiting). Begin 4 days after chemotherapy. 30 tablet 1  . prochlorperazine (COMPAZINE) 10 MG tablet Take 1 tablet (10 mg total) by mouth every 6 (six) hours as needed (Nausea or vomiting). 30 tablet 1  . triamcinolone cream (KENALOG) 0.1 % Apply 1 application topically 2 (two) times daily. (Patient taking differently: Apply 1 application topically 2 (two) times daily as needed (skin rashes). ) 15 g 0  . valACYclovir (VALTREX) 500 MG tablet Take 1 tablet (500 mg total) by mouth 2 (two) times daily. (Patient taking differently: Take 500 mg by mouth as needed (flare up). ) 6 tablet 2   No current facility-administered medications for this visit.     PHYSICAL EXAMINATION: ECOG PERFORMANCE STATUS: 1 - Symptomatic but completely ambulatory  Vitals:   01/20/19 0822  BP: (!) 155/51  Pulse: 85  Resp: 17  Temp: 97.8 F (36.6 C)  SpO2: (!) 10%   Filed Weights   01/20/19 0822  Weight: 154 lb 12.8 oz (70.2 kg)    GENERAL: alert, no distress and comfortable SKIN: skin color, texture, turgor are normal, no rashes or significant lesions EYES: normal, Conjunctiva are pink and non-injected, sclera clear OROPHARYNX: no exudate,  no erythema and lips, buccal mucosa, and tongue normal  NECK: supple, thyroid normal size, non-tender, without nodularity LYMPH: no palpable lymphadenopathy in the cervical, axillary or inguinal LUNGS: clear to auscultation and percussion with normal breathing effort HEART: regular rate & rhythm and no murmurs and no lower extremity edema ABDOMEN: abdomen soft, non-tender and normal bowel sounds MUSCULOSKELETAL: no cyanosis of digits and no clubbing  NEURO: alert & oriented x 3 with fluent speech, no focal motor/sensory deficits EXTREMITIES: No lower extremity edema  LABORATORY DATA:  I have reviewed the data as listed CMP Latest Ref Rng & Units 01/13/2019 01/05/2019 12/30/2018  Glucose 70 - 99 mg/dL 104(H) 154(H) 175(H)  BUN 8 - 23 mg/dL 15 13 12    Creatinine 0.44 - 1.00 mg/dL 0.77 0.73 0.78  Sodium 135 - 145 mmol/L 141 139 139  Potassium 3.5 - 5.1 mmol/L 4.0 3.1(L) 3.5  Chloride 98 - 111 mmol/L 109 108 106  CO2 22 - 32 mmol/L 23 21(L) 23  Calcium 8.9 - 10.3 mg/dL 9.0 8.4(L) 8.6(L)  Total Protein 6.5 - 8.1 g/dL 6.3(L) - 6.1(L)  Total Bilirubin 0.3 - 1.2 mg/dL 0.5 - 0.7  Alkaline Phos 38 - 126 U/L 64 - 73  AST 15 - 41 U/L 17 - 13(L)  ALT 0 - 44 U/L 16 - 11    Lab Results  Component Value Date   WBC 3.7 (L) 01/13/2019   HGB 8.9 (L) 01/13/2019   HCT 26.6 (L) 01/13/2019   MCV 114.2 (H) 01/13/2019   PLT 100 (L) 01/13/2019   NEUTROABS 2.0 01/13/2019    ASSESSMENT & PLAN:  Malignant neoplasm of upper-outer quadrant of left breast in female, estrogen receptor positive ([place]) With prior history of breast implants that were ruptured/replaced, patient had cellulitis LIQ left breast for 1 week, and UOQ asymmetry 1.5 cm, MRI right breast biopsy benign, left breast numerous masses 7 cm skin thickening: 2 biopsies from left breast: Grade 2-3 IDC ER PR positive, HER-2 positive, Ki-67 50% Clinical stage: T4 N0 M0 stage IIIb  Treatment plan 1. Neoadjuvant chemotherapy with TCH Perjeta 6 cycles completed 12/30/2018 followed by Herceptin/ Perjeta vs Kadcyla maintenance for 1 year 2. Followed by mastectomy with sentinel lymph node study 3. Followed by adjuvant radiation therapy --------------------------------------------------------------- Breast MRI 01/19/2019: Significant improvement.  Index lesion previously 1.4 cm is 0.9 cm.  Heterogeneous enhancement previously 7 x 5 cm is 5.4 x 3.9 cm.  Radiology review: Discussed radiology images and provided her with a copy of the report. Treatment plan: Mastectomy with sentinel lymph node biopsy followed by radiation. I suspect that there will be still residual disease and we might consider Kadcyla maintenance therapy.  Severe anemia: Hemoglobin 8 and she is symptomatic.  We will administer 1 unit  of PRBC today or next available. Hypomagnesemia: I will give her IV magnesium because she was not unable to tolerate oral magnesium.  We will set her up for next Herceptin treatment. Return to clinic after surgery to discuss the pathology report and to determine the further treatment plan.  No orders of the defined types were placed in this encounter.  The patient has a good understanding of the overall plan. she agrees with it. she will call with any problems that may develop before the next visit here.  [name], MD 01/20/2019  Julious Oka Dorshimer am acting as scribe for Dr. [name].  I have reviewed the above documentation for accuracy and completeness, and I agree with the above.

## 2019-01-20 ENCOUNTER — Other Ambulatory Visit: Payer: Self-pay | Admitting: *Deleted

## 2019-01-20 ENCOUNTER — Inpatient Hospital Stay: Payer: Medicare Other

## 2019-01-20 ENCOUNTER — Inpatient Hospital Stay (HOSPITAL_BASED_OUTPATIENT_CLINIC_OR_DEPARTMENT_OTHER): Payer: Medicare Other | Admitting: Hematology and Oncology

## 2019-01-20 ENCOUNTER — Other Ambulatory Visit: Payer: Self-pay

## 2019-01-20 DIAGNOSIS — C9000 Multiple myeloma not having achieved remission: Secondary | ICD-10-CM

## 2019-01-20 DIAGNOSIS — D6481 Anemia due to antineoplastic chemotherapy: Secondary | ICD-10-CM | POA: Diagnosis not present

## 2019-01-20 DIAGNOSIS — Z170 Estrogen receptor positive status [ER+]: Secondary | ICD-10-CM

## 2019-01-20 DIAGNOSIS — C50412 Malignant neoplasm of upper-outer quadrant of left female breast: Secondary | ICD-10-CM

## 2019-01-20 DIAGNOSIS — R0602 Shortness of breath: Secondary | ICD-10-CM

## 2019-01-20 DIAGNOSIS — E876 Hypokalemia: Secondary | ICD-10-CM | POA: Diagnosis not present

## 2019-01-20 DIAGNOSIS — D649 Anemia, unspecified: Secondary | ICD-10-CM

## 2019-01-20 DIAGNOSIS — Z79899 Other long term (current) drug therapy: Secondary | ICD-10-CM | POA: Diagnosis not present

## 2019-01-20 DIAGNOSIS — Z95828 Presence of other vascular implants and grafts: Secondary | ICD-10-CM

## 2019-01-20 DIAGNOSIS — R42 Dizziness and giddiness: Secondary | ICD-10-CM | POA: Diagnosis not present

## 2019-01-20 LAB — CBC WITH DIFFERENTIAL (CANCER CENTER ONLY)
Abs Immature Granulocytes: 0.01 10*3/uL (ref 0.00–0.07)
Basophils Absolute: 0 10*3/uL (ref 0.0–0.1)
Basophils Relative: 0 %
Eosinophils Absolute: 0.1 10*3/uL (ref 0.0–0.5)
Eosinophils Relative: 2 %
HCT: 24.3 % — ABNORMAL LOW (ref 36.0–46.0)
Hemoglobin: 8 g/dL — ABNORMAL LOW (ref 12.0–15.0)
Immature Granulocytes: 0 %
Lymphocytes Relative: 39 %
Lymphs Abs: 1.1 10*3/uL (ref 0.7–4.0)
MCH: 37.9 pg — ABNORMAL HIGH (ref 26.0–34.0)
MCHC: 32.9 g/dL (ref 30.0–36.0)
MCV: 115.2 fL — ABNORMAL HIGH (ref 80.0–100.0)
Monocytes Absolute: 0.3 10*3/uL (ref 0.1–1.0)
Monocytes Relative: 9 %
Neutro Abs: 1.4 10*3/uL — ABNORMAL LOW (ref 1.7–7.7)
Neutrophils Relative %: 50 %
Platelet Count: 85 10*3/uL — ABNORMAL LOW (ref 150–400)
RBC: 2.11 MIL/uL — ABNORMAL LOW (ref 3.87–5.11)
RDW: 17.5 % — ABNORMAL HIGH (ref 11.5–15.5)
WBC Count: 2.8 10*3/uL — ABNORMAL LOW (ref 4.0–10.5)
nRBC: 0 % (ref 0.0–0.2)

## 2019-01-20 LAB — CMP (CANCER CENTER ONLY)
ALT: 17 U/L (ref 0–44)
AST: 17 U/L (ref 15–41)
Albumin: 3.6 g/dL (ref 3.5–5.0)
Alkaline Phosphatase: 60 U/L (ref 38–126)
Anion gap: 8 (ref 5–15)
BUN: 10 mg/dL (ref 8–23)
CO2: 24 mmol/L (ref 22–32)
Calcium: 8.8 mg/dL — ABNORMAL LOW (ref 8.9–10.3)
Chloride: 108 mmol/L (ref 98–111)
Creatinine: 0.71 mg/dL (ref 0.44–1.00)
GFR, Est AFR Am: >60 mL/min (ref >60)
GFR, Estimated: >60 mL/min (ref >60)
Glucose, Bld: 96 mg/dL (ref 70–99)
Potassium: 3.8 mmol/L (ref 3.5–5.1)
Sodium: 140 mmol/L (ref 135–145)
Total Bilirubin: 0.3 mg/dL (ref 0.3–1.2)
Total Protein: 6.3 g/dL — ABNORMAL LOW (ref 6.5–8.1)

## 2019-01-20 MED ORDER — SODIUM CHLORIDE 0.9 % IV SOLN
2.0000 g | Freq: Once | INTRAVENOUS | Status: AC
  Administered 2019-01-20: 10:00:00 2 g via INTRAVENOUS
  Filled 2019-01-20: qty 4

## 2019-01-20 MED ORDER — HEPARIN SOD (PORK) LOCK FLUSH 100 UNIT/ML IV SOLN
500.0000 [IU] | Freq: Once | INTRAVENOUS | Status: AC
  Administered 2019-01-20: 11:00:00 500 [IU]
  Filled 2019-01-20: qty 5

## 2019-01-20 MED ORDER — SODIUM CHLORIDE 0.9% FLUSH
10.0000 mL | Freq: Once | INTRAVENOUS | Status: AC
  Administered 2019-01-20: 10 mL
  Filled 2019-01-20: qty 10

## 2019-01-20 MED ORDER — SODIUM CHLORIDE 0.9 % IV SOLN
Freq: Once | INTRAVENOUS | Status: AC
  Administered 2019-01-20: 09:00:00 via INTRAVENOUS
  Filled 2019-01-20: qty 250

## 2019-01-20 MED ORDER — SODIUM CHLORIDE 0.9% FLUSH
10.0000 mL | Freq: Once | INTRAVENOUS | Status: AC
  Administered 2019-01-20: 11:00:00 10 mL
  Filled 2019-01-20: qty 10

## 2019-01-20 NOTE — Assessment & Plan Note (Signed)
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 Neoadjuvant chemotherapy with TCH Perjeta 6 cycles completed 12/30/2018 followed by Herceptin/ Perjeta vs Kadcyla maintenance for 1 year 2. Followed by mastectomy with sentinel lymph node study 3. Followed by adjuvant radiation therapy --------------------------------------------------------------- Breast MRI 01/19/2019: Significant improvement.  Index lesion previously 1.4 cm is 0.9 cm.  Heterogeneous enhancement previously 7 x 5 cm is 5.4 x 3.9 cm.  Radiology review: Discussed radiology images and provided her with a copy of the report. Treatment plan: Mastectomy with sentinel lymph node biopsy followed by radiation. I suspect that there will be still residual disease and we might consider Kadcyla maintenance therapy.  Return to clinic after surgery to discuss the pathology report and to determine the further treatment plan.

## 2019-01-20 NOTE — Patient Instructions (Signed)
Hypomagnesemia Hypomagnesemia is a condition in which the level of magnesium in the blood is low. Magnesium is a mineral that is found in many foods. It is used in many different processes in the body. Hypomagnesemia can affect every organ in the body. In severe cases, it can cause life-threatening problems. What are the causes? This condition may be caused by:  Not getting enough magnesium in your diet.  Malnutrition.  Problems with absorbing magnesium from the intestines.  Dehydration.  Alcohol abuse.  Vomiting.  Severe or chronic diarrhea.  Some medicines, including medicines that make you urinate more (diuretics).  Certain diseases, such as kidney disease, diabetes, celiac disease, and overactive thyroid. What are the signs or symptoms? Symptoms of this condition include:  Loss of appetite.  Nausea and vomiting.  Involuntary shaking or trembling of a body part (tremor).  Muscle weakness.  Tingling in the arms and legs.  Sudden tightening of muscles (muscle spasms).  Confusion.  Psychiatric issues, such as depression, irritability, or psychosis.  A feeling of fluttering of the heart.  Seizures. These symptoms are more severe if magnesium levels drop suddenly. How is this diagnosed? This condition may be diagnosed based on:  Your symptoms and medical history.  A physical exam.  Blood and urine tests. How is this treated? Treatment depends on the cause and the severity of the condition. It may be treated with:  A magnesium supplement. This can be taken in pill form. If the condition is severe, magnesium is usually given through an IV.  Changes to your diet. You may be directed to eat foods that have a lot of magnesium, such as green leafy vegetables, peas, beans, and nuts.  Stopping any intake of alcohol. Follow these instructions at home:      Make sure that your diet includes foods with magnesium. Foods that have a lot of magnesium in them  include: ? Green leafy vegetables, such as spinach and broccoli. ? Beans and peas. ? Nuts and seeds, such as almonds and sunflower seeds. ? Whole grains, such as whole grain bread and fortified cereals.  Take magnesium supplements if your health care provider tells you to do that. Take them as directed.  Take over-the-counter and prescription medicines only as told by your health care provider.  Have your magnesium levels monitored as told by your health care provider.  When you are active, drink fluids that contain electrolytes.  Avoid drinking alcohol.  Keep all follow-up visits as told by your health care provider. This is important. Contact a health care provider if:  You get worse instead of better.  Your symptoms return. Get help right away if you:  Develop severe muscle weakness.  Have trouble breathing.  Feel that your heart is racing. Summary  Hypomagnesemia is a condition in which the level of magnesium in the blood is low.  Hypomagnesemia can affect every organ in the body.  Treatment may include eating more foods that contain magnesium, taking magnesium supplements, and not drinking alcohol.  Have your magnesium levels monitored as told by your health care provider. This information is not intended to replace advice given to you by your health care provider. Make sure you discuss any questions you have with your health care provider. Document Released: 05/08/2005 Document Revised: 07/14/2017 Document Reviewed: 07/14/2017 Elsevier Interactive Patient Education  2019 [employer]    Dehydration, Adult  Dehydration is when there is not enough fluid or water in your body. This happens when you lose more fluids  than you take in. Dehydration can range from mild to very bad. It should be treated right away to keep it from getting very bad. Symptoms of mild dehydration may include:  Thirst.  Dry lips.  Slightly dry mouth.  Dry, warm  skin.  Dizziness. Symptoms of moderate dehydration may include:  Very dry mouth.  Muscle cramps.  Dark pee (urine). Pee may be the color of tea.  Your body making less pee.  Your eyes making fewer tears.  Heartbeat that is uneven or faster than normal (palpitations).  Headache.  Light-headedness, especially when you stand up from sitting.  Fainting (syncope). Symptoms of very bad dehydration may include:  Changes in skin, such as: ? Cold and clammy skin. ? Blotchy (mottled) or pale skin. ? Skin that does not quickly return to normal after being lightly pinched and let go (poor skin turgor).  Changes in body fluids, such as: ? Feeling very thirsty. ? Your eyes making fewer tears. ? Not sweating when body temperature is high, such as in hot weather. ? Your body making very little pee.  Changes in vital signs, such as: ? Weak pulse. ? Pulse that is more than 100 beats a minute when you are sitting still. ? Fast breathing. ? Low blood pressure.  Other changes, such as: ? Sunken eyes. ? Cold hands and feet. ? Confusion. ? Lack of energy (lethargy). ? Trouble waking up from sleep. ? Short-term weight loss. ? Unconsciousness. Follow these instructions at home:   If told by your doctor, drink an ORS: ? Make an ORS by using instructions on the package. ? Start by drinking small amounts, about  cup (120 mL) every 5-10 minutes. ? Slowly drink more until you have had the amount that your doctor said to have.  Drink enough clear fluid to keep your pee clear or pale yellow. If you were told to drink an ORS, finish the ORS first, then start slowly drinking clear fluids. Drink fluids such as: ? Water. Do not drink only water by itself. Doing that can make the salt (sodium) level in your body get too low (hyponatremia). ? Ice chips. ? Fruit juice that you have added water to (diluted). ? Low-calorie sports drinks.  Avoid: ? Alcohol. ? Drinks that have a lot of sugar.  These include high-calorie sports drinks, fruit juice that does not have water added, and soda. ? Caffeine. ? Foods that are greasy or have a lot of fat or sugar.  Take over-the-counter and prescription medicines only as told by your doctor.  Do not take salt tablets. Doing that can make the salt level in your body get too high (hypernatremia).  Eat foods that have minerals (electrolytes). Examples include bananas, oranges, potatoes, tomatoes, and spinach.  Keep all follow-up visits as told by your doctor. This is important. Contact a doctor if:  You have belly (abdominal) pain that: ? Gets worse. ? Stays in one area (localizes).  You have a rash.  You have a stiff neck.  You get angry or annoyed more easily than normal (irritability).  You are more sleepy than normal.  You have a harder time waking up than normal.  You feel: ? Weak. ? Dizzy. ? Very thirsty.  You have peed (urinated) only a small amount of very dark pee during 6-8 hours. Get help right away if:  You have symptoms of very bad dehydration.  You cannot drink fluids without throwing up (vomiting).  Your symptoms get worse with treatment.  You have a fever.  You have a very bad headache.  You are throwing up or having watery poop (diarrhea) and it: ? Gets worse. ? Does not go away.  You have blood or something green (bile) in your throw-up.  You have blood in your poop (stool). This may cause poop to look black and tarry.  You have not peed in 6-8 hours.  You pass out (faint).  Your heart rate when you are sitting still is more than 100 beats a minute.  You have trouble breathing. This information is not intended to replace advice given to you by your health care provider. Make sure you discuss any questions you have with your health care provider. Document Released: 06/08/2009 Document Revised: 03/01/2016 Document Reviewed: 10/06/2015 Elsevier Interactive Patient Education  2019 [employer].

## 2019-01-21 ENCOUNTER — Encounter: Payer: Self-pay | Admitting: *Deleted

## 2019-01-21 ENCOUNTER — Other Ambulatory Visit: Payer: Self-pay | Admitting: Hematology and Oncology

## 2019-01-21 ENCOUNTER — Other Ambulatory Visit: Payer: Self-pay

## 2019-01-21 ENCOUNTER — Inpatient Hospital Stay: Payer: Medicare Other

## 2019-01-21 DIAGNOSIS — D649 Anemia, unspecified: Secondary | ICD-10-CM

## 2019-01-21 LAB — PREPARE RBC (CROSSMATCH)

## 2019-01-21 NOTE — Progress Notes (Signed)
Blood Transfusion rescheduled for tomorrow 01/22/2019 due to blood unavailable at blood bank.

## 2019-01-22 ENCOUNTER — Other Ambulatory Visit: Payer: Self-pay

## 2019-01-22 ENCOUNTER — Inpatient Hospital Stay: Payer: Medicare Other

## 2019-01-22 DIAGNOSIS — D6481 Anemia due to antineoplastic chemotherapy: Secondary | ICD-10-CM | POA: Diagnosis not present

## 2019-01-22 DIAGNOSIS — C50412 Malignant neoplasm of upper-outer quadrant of left female breast: Secondary | ICD-10-CM | POA: Diagnosis not present

## 2019-01-22 DIAGNOSIS — T451X5A Adverse effect of antineoplastic and immunosuppressive drugs, initial encounter: Secondary | ICD-10-CM

## 2019-01-22 DIAGNOSIS — Z170 Estrogen receptor positive status [ER+]: Secondary | ICD-10-CM | POA: Diagnosis not present

## 2019-01-22 DIAGNOSIS — E876 Hypokalemia: Secondary | ICD-10-CM | POA: Diagnosis not present

## 2019-01-22 DIAGNOSIS — Z79899 Other long term (current) drug therapy: Secondary | ICD-10-CM | POA: Diagnosis not present

## 2019-01-22 DIAGNOSIS — D649 Anemia, unspecified: Secondary | ICD-10-CM

## 2019-01-22 MED ORDER — ACETAMINOPHEN 325 MG PO TABS
650.0000 mg | ORAL_TABLET | Freq: Once | ORAL | Status: AC
  Administered 2019-01-22: 650 mg via ORAL

## 2019-01-22 MED ORDER — SODIUM CHLORIDE 0.9% IV SOLUTION
250.0000 mL | Freq: Once | INTRAVENOUS | Status: AC
  Administered 2019-01-22: 250 mL via INTRAVENOUS
  Filled 2019-01-22: qty 250

## 2019-01-22 MED ORDER — ACETAMINOPHEN 325 MG PO TABS
ORAL_TABLET | ORAL | Status: AC
  Filled 2019-01-22: qty 2

## 2019-01-22 MED ORDER — HEPARIN SOD (PORK) LOCK FLUSH 100 UNIT/ML IV SOLN
500.0000 [IU] | Freq: Every day | INTRAVENOUS | Status: AC | PRN
  Administered 2019-01-22: 12:00:00 500 [IU]
  Filled 2019-01-22: qty 5

## 2019-01-22 MED ORDER — SODIUM CHLORIDE 0.9% FLUSH
10.0000 mL | INTRAVENOUS | Status: AC | PRN
  Administered 2019-01-22: 10 mL
  Filled 2019-01-22: qty 10

## 2019-01-22 MED ORDER — DIPHENHYDRAMINE HCL 25 MG PO CAPS
ORAL_CAPSULE | ORAL | Status: AC
  Filled 2019-01-22: qty 1

## 2019-01-22 MED ORDER — DIPHENHYDRAMINE HCL 25 MG PO CAPS
25.0000 mg | ORAL_CAPSULE | Freq: Once | ORAL | Status: AC
  Administered 2019-01-22: 25 mg via ORAL

## 2019-01-22 NOTE — Patient Instructions (Signed)
Blood Transfusion, Adult, Care After This sheet gives you information about how to care for yourself after your procedure. Your doctor may also give you more specific instructions. If you have problems or questions, contact your doctor. Follow these instructions at home:   Take over-the-counter and prescription medicines only as told by your doctor.  Go back to your normal activities as told by your doctor.  Follow instructions from your doctor about how to take care of the area where an IV tube was put into your vein (insertion site). Make sure you: ? Wash your hands with soap and water before you change your bandage (dressing). If there is no soap and water, use hand sanitizer. ? Change your bandage as told by your doctor.  Check your IV insertion site every day for signs of infection. Check for: ? More redness, swelling, or pain. ? More fluid or blood. ? Warmth. ? Pus or a bad smell. Contact a doctor if:  You have more redness, swelling, or pain around the IV insertion site.  You have more fluid or blood coming from the IV insertion site.  Your IV insertion site feels warm to the touch.  You have pus or a bad smell coming from the IV insertion site.  Your pee (urine) turns pink, red, or brown.  You feel weak after doing your normal activities. Get help right away if:  You have signs of a serious allergic or body defense (immune) system reaction, including: ? Itchiness. ? Hives. ? Trouble breathing. ? Anxiety. ? Pain in your chest or lower back. ? Fever, flushing, and chills. ? Fast pulse. ? Rash. ? Watery poop (diarrhea). ? Throwing up (vomiting). ? Dark pee. ? Serious headache. ? Dizziness. ? Stiff neck. ? Yellow color in your face or the white parts of your eyes (jaundice). Summary  After a blood transfusion, return to your normal activities as told by your doctor.  Every day, check for signs of infection where the IV tube was put into your vein.  Some  signs of infection are warm skin, more redness and pain, more fluid or blood, and pus or a bad smell where the needle went in.  Contact your doctor if you feel weak or have any unusual symptoms. This information is not intended to replace advice given to you by your health care provider. Make sure you discuss any questions you have with your health care provider. Document Released: 09/02/2014 Document Revised: 04/05/2016 Document Reviewed: 04/05/2016 Elsevier Interactive Patient Education  2019 Elsevier Inc.   Coronavirus (COVID-19) Are you at risk?  Are you at risk for the Coronavirus (COVID-19)?  To be considered HIGH RISK for Coronavirus (COVID-19), you have to meet the following criteria:  . Traveled to [place], [place], [place], [place] or [place]; or in the [place] to [place], [place], [place], or [place]; and have fever, cough, and shortness of breath within the last 2 weeks of travel OR . Been in close contact with a person diagnosed with COVID-19 within the last 2 weeks and have fever, cough, and shortness of breath . IF YOU DO NOT MEET THESE CRITERIA, YOU ARE CONSIDERED LOW RISK FOR COVID-19.  What to do if you are HIGH RISK for COVID-19?  . If you are having a medical emergency, call 911. . Seek medical care right away. Before you go to a doctor's office, urgent care or emergency department, call ahead and tell them about your recent travel, contact with someone diagnosed with   COVID-19, and your symptoms. You should receive instructions from your physician's office regarding next steps of care.  . When you arrive at healthcare provider, tell the healthcare staff immediately you have returned from visiting [place], [place], [place], [place] or [place]; or traveled in the [place] to [place], [place], [place], or [place]; in the last two weeks or you have been in close contact with a person diagnosed with COVID-19 in the last 2 weeks.   . Tell the health care  staff about your symptoms: fever, cough and shortness of breath. . After you have been seen by a medical provider, you will be either: o Tested for (COVID-19) and discharged home on quarantine except to seek medical care if symptoms worsen, and asked to  - Stay home and avoid contact with others until you get your results (4-5 days)  - Avoid travel on public transportation if possible (such as bus, train, or airplane) or o Sent to the Emergency Department by EMS for evaluation, COVID-19 testing, and possible admission depending on your condition and test results.  What to do if you are LOW RISK for COVID-19?  Reduce your risk of any infection by using the same precautions used for avoiding the common cold or flu:  . Wash your hands often with soap and warm water for at least 20 seconds.  If soap and water are not readily available, use an alcohol-based hand sanitizer with at least 60% alcohol.  . If coughing or sneezing, cover your mouth and nose by coughing or sneezing into the elbow areas of your shirt or coat, into a tissue or into your sleeve (not your hands). . Avoid shaking hands with others and consider head nods or verbal greetings only. . Avoid touching your eyes, nose, or mouth with unwashed hands.  . Avoid close contact with people who are sick. . Avoid places or events with large numbers of people in one location, like concerts or sporting events. . Carefully consider travel plans you have or are making. . If you are planning any travel outside or inside the [place], visit the CDC's Travelers' Health webpage for the latest health notices. . If you have some symptoms but not all symptoms, continue to monitor at home and seek medical attention if your symptoms worsen. . If you are having a medical emergency, call 911.   ADDITIONAL HEALTHCARE OPTIONS FOR PATIENTS  [facility] / e-Visit: [email or web address]         [facility]:  [phone]  [facility]: [phone]                   [facility]: [phone]   

## 2019-01-24 LAB — TYPE AND SCREEN
ABO/RH(D): A POS
Antibody Screen: POSITIVE
Donor AG Type: NEGATIVE
Unit division: 0

## 2019-01-24 LAB — BPAM RBC
Blood Product Expiration Date: 202006072359
ISSUE DATE / TIME: 202005290929
Unit Type and Rh: 6200

## 2019-01-27 ENCOUNTER — Telehealth: Payer: Self-pay

## 2019-01-27 NOTE — Telephone Encounter (Signed)
RN placed return call to patient regarding blood discharge from right nipple.   Pt reports bloody discharge from nipple X 3-4 days.  Scant amount.  No changes in nipple, no injury per patient.    RN reviewed imaging with MD and informed that patient has verbalized she is planning to proceed with bilateral mastectomies.  Has upcoming appointment with surgeon on 6/11.  Per MD no need for further interventions at this time.  RN notified patient, patient voiced understanding.  No further needs.

## 2019-02-04 ENCOUNTER — Other Ambulatory Visit: Payer: Self-pay | Admitting: General Surgery

## 2019-02-04 DIAGNOSIS — Z96652 Presence of left artificial knee joint: Secondary | ICD-10-CM | POA: Diagnosis not present

## 2019-02-04 DIAGNOSIS — N6325 Unspecified lump in the left breast, overlapping quadrants: Secondary | ICD-10-CM

## 2019-02-04 DIAGNOSIS — Z803 Family history of malignant neoplasm of breast: Secondary | ICD-10-CM | POA: Diagnosis not present

## 2019-02-04 DIAGNOSIS — N6452 Nipple discharge: Secondary | ICD-10-CM | POA: Diagnosis not present

## 2019-02-04 DIAGNOSIS — M1712 Unilateral primary osteoarthritis, left knee: Secondary | ICD-10-CM | POA: Diagnosis not present

## 2019-02-08 ENCOUNTER — Encounter: Payer: Self-pay | Admitting: *Deleted

## 2019-02-09 ENCOUNTER — Telehealth: Payer: Self-pay

## 2019-02-09 NOTE — Telephone Encounter (Signed)
Form faxed to [facility] for mastectomy products.  Pt notified.

## 2019-02-10 ENCOUNTER — Inpatient Hospital Stay: Payer: Medicare Other | Attending: Hematology and Oncology

## 2019-02-10 ENCOUNTER — Other Ambulatory Visit: Payer: Self-pay

## 2019-02-10 VITALS — BP 130/54 | HR 72 | Temp 98.2°F | Resp 18

## 2019-02-10 DIAGNOSIS — Z79899 Other long term (current) drug therapy: Secondary | ICD-10-CM | POA: Insufficient documentation

## 2019-02-10 DIAGNOSIS — C50412 Malignant neoplasm of upper-outer quadrant of left female breast: Secondary | ICD-10-CM

## 2019-02-10 DIAGNOSIS — Z170 Estrogen receptor positive status [ER+]: Secondary | ICD-10-CM | POA: Diagnosis not present

## 2019-02-10 DIAGNOSIS — D649 Anemia, unspecified: Secondary | ICD-10-CM | POA: Insufficient documentation

## 2019-02-10 DIAGNOSIS — Z5112 Encounter for antineoplastic immunotherapy: Secondary | ICD-10-CM | POA: Insufficient documentation

## 2019-02-10 MED ORDER — DIPHENHYDRAMINE HCL 25 MG PO CAPS
ORAL_CAPSULE | ORAL | Status: AC
  Filled 2019-02-10: qty 2

## 2019-02-10 MED ORDER — SODIUM CHLORIDE 0.9% FLUSH
10.0000 mL | INTRAVENOUS | Status: DC | PRN
  Administered 2019-02-10: 10 mL
  Filled 2019-02-10: qty 10

## 2019-02-10 MED ORDER — ACETAMINOPHEN 325 MG PO TABS
ORAL_TABLET | ORAL | Status: AC
  Filled 2019-02-10: qty 2

## 2019-02-10 MED ORDER — ACETAMINOPHEN 325 MG PO TABS
650.0000 mg | ORAL_TABLET | Freq: Once | ORAL | Status: AC
  Administered 2019-02-10: 650 mg via ORAL

## 2019-02-10 MED ORDER — SODIUM CHLORIDE 0.9 % IV SOLN
Freq: Once | INTRAVENOUS | Status: AC
  Administered 2019-02-10: 09:00:00 via INTRAVENOUS
  Filled 2019-02-10: qty 250

## 2019-02-10 MED ORDER — DIPHENHYDRAMINE HCL 25 MG PO CAPS
50.0000 mg | ORAL_CAPSULE | Freq: Once | ORAL | Status: AC
  Administered 2019-02-10: 50 mg via ORAL

## 2019-02-10 MED ORDER — HEPARIN SOD (PORK) LOCK FLUSH 100 UNIT/ML IV SOLN
500.0000 [IU] | Freq: Once | INTRAVENOUS | Status: AC | PRN
  Administered 2019-02-10: 500 [IU]
  Filled 2019-02-10: qty 5

## 2019-02-10 MED ORDER — TRASTUZUMAB CHEMO 150 MG IV SOLR
450.0000 mg | Freq: Once | INTRAVENOUS | Status: AC
  Administered 2019-02-10: 450 mg via INTRAVENOUS
  Filled 2019-02-10: qty 21.43

## 2019-02-10 NOTE — Patient Instructions (Signed)
[facility] Discharge Instructions for Patients Receiving Chemotherapy  Today you received the following chemotherapy agents Herceptin.  To help prevent nausea and vomiting after your treatment, we encourage you to take your nausea medication.   If you develop nausea and vomiting that is not controlled by your nausea medication, call the clinic.   BELOW ARE SYMPTOMS THAT SHOULD BE REPORTED IMMEDIATELY:  *FEVER GREATER THAN 100.5 F  *CHILLS WITH OR WITHOUT FEVER  NAUSEA AND VOMITING THAT IS NOT CONTROLLED WITH YOUR NAUSEA MEDICATION  *UNUSUAL SHORTNESS OF BREATH  *UNUSUAL BRUISING OR BLEEDING  TENDERNESS IN MOUTH AND THROAT WITH OR WITHOUT PRESENCE OF ULCERS  *URINARY PROBLEMS  *BOWEL PROBLEMS  UNUSUAL RASH Items with * indicate a potential emergency and should be followed up as soon as possible.  Feel free to call the clinic should you have any questions or concerns. The clinic phone number is [phone].  Please show the CHEMO ALERT CARD at check-in to the Emergency Department and triage nurse.   

## 2019-02-11 DIAGNOSIS — H02889 Meibomian gland dysfunction of unspecified eye, unspecified eyelid: Secondary | ICD-10-CM | POA: Diagnosis not present

## 2019-02-12 NOTE — Progress Notes (Signed)
[id number] - [name], [place] - [place] [address] [place] [address] [place] [address] Phone: [phone] Fax: [phone]      Your procedure is scheduled on Thursday 02/18/2019.  Report to [facility] Main Entrance "A" at 0900 A.M., and check in at the Admitting office.  Your surgery is scheduled from 11:00am - 1:00pm  Call this number if you have problems the morning of surgery:  [phone]  Call [phone] if you have any questions prior to your surgery date Monday-Friday 8am-4pm    Remember:  Do not eat after midnight the night before your surgery.  You may drink clear liquids until 0800am the morning of your surgery.   Clear liquids allowed are: Water, Non-Citrus Juices (without pulp), Carbonated Beverages, Clear Tea, Black Coffee Only, and Gatorade  Please complete your PRE-SURGERY ENSURE that was provided to you by 8:00am the morning of your surgery.  Please, if able, drink it in one sitting. DO NOT SIP.     Take these medicines the morning of surgery with A SIP OF WATER: Amlodipine (Norvasc) Azelastine HCl nasal spray - if needed Fluticasone (Flonase) Loratidine (Claritin) Valacyclovir (Valtrex) - if needed  7 days prior to surgery STOP taking any Aspirin (unless otherwise instructed by your surgeon), Aleve, Naproxen, Ibuprofen, Motrin, Advil, Goody's, BC's, all herbal medications, fish oil, and all vitamins.   WHAT DO I DO ABOUT MY DIABETES MEDICATION?  [name] Do not take oral diabetes medicines (pills) the morning of surgery. - do not take your metformin the morning of surgery   How to Manage Your Diabetes Before and After Surgery  Why is it important to control my blood sugar before and after surgery? . Improving blood sugar levels before and after surgery helps healing and can limit problems. . A way of improving blood sugar control is eating a healthy diet by: o  Eating less sugar  and carbohydrates o  Increasing activity/exercise o  Talking with your doctor about reaching your blood sugar goals . High blood sugars (greater than 180 mg/dL) can raise your risk of infections and slow your recovery, so you will need to focus on controlling your diabetes during the weeks before surgery. . Make sure that the doctor who takes care of your diabetes knows about your planned surgery including the date and location.  How do I manage my blood sugar before surgery? . Check your blood sugar at least 4 times a day, starting 2 days before surgery, to make sure that the level is not too high or low. o Check your blood sugar the morning of your surgery when you wake up and every 2 hours until you get to the Short Stay unit. . If your blood sugar is less than 70 mg/dL, you will need to treat for low blood sugar: o Do not take insulin. o Treat a low blood sugar (less than 70 mg/dL) with  cup of clear juice (cranberry or apple), 4 glucose tablets, OR glucose gel. o Recheck blood sugar in 15 minutes after treatment (to make sure it is greater than 70 mg/dL). If your blood sugar is not greater than 70 mg/dL on recheck, call [phone] for further instructions. . Report your blood sugar to the short stay nurse when you get to Short Stay.  . If you are admitted to the hospital after surgery: o Your blood sugar will be checked by the staff and you will probably be given  insulin after surgery (instead of oral diabetes medicines) to make sure you have good blood sugar levels. o The goal for blood sugar control after surgery is 80-180 mg/dL.    The Morning of Surgery  Do not wear jewelry, make-up or nail polish.  Do not wear lotions, powders, or perfumes/colognes, or deodorant  Do not shave 48 hours prior to surgery.  Men may shave face and neck.  Do not bring valuables to the hospital.  [facility] is not responsible for any belongings or valuables.  If you are a smoker, DO NOT Smoke 24  hours prior to surgery  IF you wear a CPAP at night please bring your mask, tubing, and machine the morning of surgery   Remember that you must have someone to transport you home after your surgery, and remain with you for 24 hours if you are discharged the same day.   Contacts, eyeglasses, hearing aids, dentures or bridgework may not be worn into surgery.    For patients admitted to the hospital, discharge time will be determined by your treatment team.  Patients discharged the day of surgery will not be allowed to drive home.    Special instructions:   [facility]- Preparing For Surgery  Before surgery, you can play an important role. Because skin is not sterile, your skin needs to be as free of germs as possible. You can reduce the number of germs on your skin by washing with CHG (chlorahexidine gluconate) Soap before surgery.  CHG is an antiseptic cleaner which kills germs and bonds with the skin to continue killing germs even after washing.    Oral Hygiene is also important to reduce your risk of infection.  Remember - BRUSH YOUR TEETH THE MORNING OF SURGERY WITH YOUR REGULAR TOOTHPASTE  Please do not use if you have an allergy to CHG or antibacterial soaps. If your skin becomes reddened/irritated stop using the CHG.  Do not shave (including legs and underarms) for at least 48 hours prior to first CHG shower. It is OK to shave your face.  Please follow these instructions carefully.   1. Shower the NIGHT BEFORE SURGERY and the MORNING OF SURGERY with CHG Soap.   2. If you chose to wash your hair, wash your hair first as usual with your normal shampoo.  3. After you shampoo, rinse your hair and body thoroughly to remove the shampoo.  4. Use CHG as you would any other liquid soap. You can apply CHG directly to the skin and wash gently with a scrungie or a clean washcloth.   5. Apply the CHG Soap to your body ONLY FROM THE NECK DOWN.  Do not use on open wounds or open sores. Avoid  contact with your eyes, ears, mouth and genitals (private parts). Wash Face and genitals (private parts)  with your normal soap.   6. Wash thoroughly, paying special attention to the area where your surgery will be performed.  7. Thoroughly rinse your body with warm water from the neck down.  8. DO NOT shower/wash with your normal soap after using and rinsing off the CHG Soap.  9. Pat yourself dry with a CLEAN TOWEL.  10. Wear CLEAN PAJAMAS to bed the night before surgery, wear comfortable clothes the morning of surgery  11. Place CLEAN SHEETS on your bed the night of your first shower and DO NOT SLEEP WITH PETS.    Day of Surgery: Shower as stated above. Do not apply any deodorants/lotions.  Please wear  clean clothes to the hospital/surgery center.   Remember to brush your teeth WITH YOUR REGULAR TOOTHPASTE.   Please read over the following fact sheets that you were given.

## 2019-02-15 ENCOUNTER — Other Ambulatory Visit: Payer: Self-pay

## 2019-02-15 ENCOUNTER — Encounter (HOSPITAL_COMMUNITY)
Admission: RE | Admit: 2019-02-15 | Discharge: 2019-02-15 | Disposition: A | Discharge status: Home / Self Care | Payer: Medicare Other | Source: Ambulatory Visit | Attending: General Surgery | Admitting: General Surgery

## 2019-02-15 ENCOUNTER — Other Ambulatory Visit (HOSPITAL_COMMUNITY)
Admission: RE | Admit: 2019-02-15 | Discharge: 2019-02-15 | Disposition: A | Discharge status: Home / Self Care | Payer: Medicare Other | Source: Ambulatory Visit | Attending: General Surgery | Admitting: General Surgery

## 2019-02-15 ENCOUNTER — Encounter (HOSPITAL_COMMUNITY): Payer: Self-pay

## 2019-02-15 DIAGNOSIS — Z1159 Encounter for screening for other viral diseases: Secondary | ICD-10-CM | POA: Diagnosis not present

## 2019-02-15 DIAGNOSIS — Z01812 Encounter for preprocedural laboratory examination: Secondary | ICD-10-CM | POA: Diagnosis not present

## 2019-02-15 LAB — BASIC METABOLIC PANEL
Anion gap: 9 (ref 5–15)
BUN: 20 mg/dL (ref 8–23)
CO2: 24 mmol/L (ref 22–32)
Calcium: 9.5 mg/dL (ref 8.9–10.3)
Chloride: 107 mmol/L (ref 98–111)
Creatinine, Ser: 0.78 mg/dL (ref 0.44–1.00)
GFR calc Af Amer: >60 mL/min (ref >60)
GFR calc non Af Amer: >60 mL/min (ref >60)
Glucose, Bld: 115 mg/dL — ABNORMAL HIGH (ref 70–99)
Potassium: 4.2 mmol/L (ref 3.5–5.1)
Sodium: 140 mmol/L (ref 135–145)

## 2019-02-15 LAB — GLUCOSE, CAPILLARY: Glucose-Capillary: 123 mg/dL — ABNORMAL HIGH (ref 70–99)

## 2019-02-15 LAB — CBC
HCT: 24.8 % — ABNORMAL LOW (ref 36.0–46.0)
Hemoglobin: 8.4 g/dL — ABNORMAL LOW (ref 12.0–15.0)
MCH: 36.5 pg — ABNORMAL HIGH (ref 26.0–34.0)
MCHC: 33.9 g/dL (ref 30.0–36.0)
MCV: 107.8 fL — ABNORMAL HIGH (ref 80.0–100.0)
Platelets: 33 10*3/uL — ABNORMAL LOW (ref 150–400)
RBC: 2.3 MIL/uL — ABNORMAL LOW (ref 3.87–5.11)
RDW: 21.2 % — ABNORMAL HIGH (ref 11.5–15.5)
WBC: 2.4 10*3/uL — ABNORMAL LOW (ref 4.0–10.5)
nRBC: 0 % (ref 0.0–0.2)

## 2019-02-15 LAB — SARS CORONAVIRUS 2 (TAT 6-24 HRS): SARS Coronavirus 2: NEGATIVE

## 2019-02-15 NOTE — Progress Notes (Addendum)
  Coronavirus Screening Scheduled for COVID test today at [facility] Have you experienced the following symptoms:  Cough yes/no: No Fever (>100.69F)  yes/no: No Runny nose yes/no: No Sore throat yes/no: No Difficulty breathing/shortness of breath  yes/no: No Have you or a family member traveled in the last 14 days and where? yes/no: No   PCP - Dr. Eliezer Lofts  Cardiologist -denies  Oncologist-Dr. [name]  Chest x-ray - NA  EKG - 01-06-19 Epic  Stress Test - denies  ECHO - 09/28/18 Epic  Cardiac Cath - denies  AICD-denies PM-denies LOOP-denies  Sleep Study - denies CPAP - NA  LABS-CBC,BMP,A1C Abnormal labs. Surgeon's office & James,PA notified (low platelets & Hgb)-  ASA-denies  ERAS-G2 drink given with instructions.  HA1C-  02-15-19   Fasting Blood Sugar - 123.  Lowest-90's   Highest-300 Checks Blood Sugar __once___ times a day  Anesthesia-Y. Abnormal labs  Pt denies having chest pain, sob, or fever at this time. All instructions explained to the pt, with a verbal understanding of the material. Pt agrees to go over the instructions while at home for a better understanding. The opportunity to ask questions was provided.

## 2019-02-16 ENCOUNTER — Other Ambulatory Visit: Payer: Self-pay

## 2019-02-16 ENCOUNTER — Inpatient Hospital Stay: Payer: Medicare Other

## 2019-02-16 ENCOUNTER — Other Ambulatory Visit: Payer: Self-pay | Admitting: Hematology and Oncology

## 2019-02-16 DIAGNOSIS — Z79899 Other long term (current) drug therapy: Secondary | ICD-10-CM | POA: Diagnosis not present

## 2019-02-16 DIAGNOSIS — Z170 Estrogen receptor positive status [ER+]: Secondary | ICD-10-CM | POA: Diagnosis not present

## 2019-02-16 DIAGNOSIS — D649 Anemia, unspecified: Secondary | ICD-10-CM

## 2019-02-16 DIAGNOSIS — C50412 Malignant neoplasm of upper-outer quadrant of left female breast: Secondary | ICD-10-CM | POA: Diagnosis not present

## 2019-02-16 DIAGNOSIS — Z5112 Encounter for antineoplastic immunotherapy: Secondary | ICD-10-CM | POA: Diagnosis not present

## 2019-02-16 LAB — CBC WITH DIFFERENTIAL (CANCER CENTER ONLY)
Abs Immature Granulocytes: 0 10*3/uL (ref 0.00–0.07)
Basophils Absolute: 0 10*3/uL (ref 0.0–0.1)
Basophils Relative: 0 %
Eosinophils Absolute: 0.1 10*3/uL (ref 0.0–0.5)
Eosinophils Relative: 2 %
HCT: 21.9 % — ABNORMAL LOW (ref 36.0–46.0)
Hemoglobin: 7.8 g/dL — ABNORMAL LOW (ref 12.0–15.0)
Immature Granulocytes: 0 %
Lymphocytes Relative: 51 %
Lymphs Abs: 1.4 10*3/uL (ref 0.7–4.0)
MCH: 37.7 pg — ABNORMAL HIGH (ref 26.0–34.0)
MCHC: 35.6 g/dL (ref 30.0–36.0)
MCV: 105.8 fL — ABNORMAL HIGH (ref 80.0–100.0)
Monocytes Absolute: 0.3 10*3/uL (ref 0.1–1.0)
Monocytes Relative: 9 %
Neutro Abs: 1 10*3/uL — ABNORMAL LOW (ref 1.7–7.7)
Neutrophils Relative %: 38 %
Platelet Count: 34 10*3/uL — ABNORMAL LOW (ref 150–400)
RBC: 2.07 MIL/uL — ABNORMAL LOW (ref 3.87–5.11)
RDW: 21.2 % — ABNORMAL HIGH (ref 11.5–15.5)
WBC Count: 2.7 10*3/uL — ABNORMAL LOW (ref 4.0–10.5)
nRBC: 0 % (ref 0.0–0.2)

## 2019-02-16 LAB — IMMATURE PLATELET FRACTION: Immature Platelet Fraction: 4.7 % (ref 1.2–8.6)

## 2019-02-16 LAB — PLATELET BY CITRATE

## 2019-02-16 LAB — HEMOGLOBIN A1C
Hgb A1c MFr Bld: 5.6 % (ref 4.8–5.6)
Mean Plasma Glucose: 114 mg/dL

## 2019-02-16 NOTE — Anesthesia Preprocedure Evaluation (Addendum)
Anesthesia Evaluation  Patient identified by MRN, date of birth, ID band Patient awake    Reviewed: Allergy & Precautions, H&P , NPO status , Patient's Chart, lab work & pertinent test results  Airway Mallampati: II  TM Distance: >3 FB Neck ROM: Full    Dental no notable dental hx. (+) Teeth Intact, Dental Advisory Given   Pulmonary neg pulmonary ROS,    Pulmonary exam normal breath sounds clear to auscultation       Cardiovascular hypertension, On Medications  Rhythm:Regular Rate:Normal     Neuro/Psych negative neurological ROS  negative psych ROS   GI/Hepatic Neg liver ROS, GERD  ,  Endo/Other  diabetes, Type 2, Oral Hypoglycemic Agents  Renal/GU negative Renal ROS  negative genitourinary   Musculoskeletal   Abdominal   Peds  Hematology negative hematology ROS (+)   Anesthesia Other Findings   Reproductive/Obstetrics negative OB ROS                            Anesthesia Physical Anesthesia Plan  ASA: II  Anesthesia Plan: General   Post-op Pain Management:  Regional for Post-op pain   Induction: Intravenous  PONV Risk Score and Plan: 4 or greater and Ondansetron, Dexamethasone and Midazolam  Airway Management Planned: Oral ETT  Additional Equipment:   Intra-op Plan:   Post-operative Plan: Extubation in OR  Informed Consent: I have reviewed the patients History and Physical, chart, labs and discussed the procedure including the risks, benefits and alternatives for the proposed anesthesia with the patient or authorized representative who has indicated his/her understanding and acceptance.     Dental advisory given  Plan Discussed with: CRNA  Anesthesia Plan Comments: (Pt currently receiving chemotherapy for breast CA. Surgery recently cancelled due to thrombocytopenia (33k at that time). She had followup with Dr. Lindi Adie on 7/8, platelets have now recovered to 160k, Hgb  improved from 7.8 > 8.8.  Echo 03/04/19: 1. The left ventricle has normal systolic function with an ejection fraction of 60-65%. The cavity size was normal. Left ventricular diastolic Doppler parameters are consistent with pseudonormalization.  2. The right ventricle has normal systolic function. The cavity was normal. There is no increase in right ventricular wall thickness.  3. No evidence of mitral valve stenosis.  4. Aortic valve regurgitation is trivial by color flow Doppler. No stenosis of the aortic valve.  5. The aortic root and ascending aorta are normal in size and structure.  6. The average left ventricular global longitudinal strain is -24.9 %.  7. The interatrial septum was not assessed.)    Anesthesia Quick Evaluation

## 2019-02-16 NOTE — Progress Notes (Signed)
We will recheck her CBC in a blue top tube today to confirm the platelet count. Subsequently we will check CBCs weekly Her surgery has been tentatively postponed to 2 weeks from now.

## 2019-02-18 ENCOUNTER — Ambulatory Visit (HOSPITAL_COMMUNITY): Payer: Medicare Other

## 2019-02-18 ENCOUNTER — Other Ambulatory Visit: Payer: Self-pay | Admitting: *Deleted

## 2019-02-18 DIAGNOSIS — Z170 Estrogen receptor positive status [ER+]: Secondary | ICD-10-CM

## 2019-02-18 DIAGNOSIS — C50412 Malignant neoplasm of upper-outer quadrant of left female breast: Secondary | ICD-10-CM

## 2019-02-24 NOTE — Assessment & Plan Note (Signed)
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 Neoadjuvant chemotherapy with TCH Perjeta 6 cycles completed 12/30/2018 followed by Herceptin/ Perjeta vs Kadcyla maintenance for 1 year 2. Followed by mastectomy with sentinel lymph node study 3. Followed by adjuvant radiation therapy --------------------------------------------------------------- Breast MRI 01/19/2019: Significant improvement.  Index lesion previously 1.4 cm is 0.9 cm.  Heterogeneous enhancement previously 7 x 5 cm is 5.4 x 3.9 cm.  Mastectomy is been delayed because of pancytopenia.  Hypomagnesemia: IV magnesium replacement Current treatment: Herceptin maintenance until surgery is done so that we can determine the future treatment plan based upon the pathology result.

## 2019-02-25 ENCOUNTER — Inpatient Hospital Stay: Payer: Medicare Other | Attending: Hematology and Oncology

## 2019-02-25 ENCOUNTER — Other Ambulatory Visit: Payer: Self-pay

## 2019-02-25 DIAGNOSIS — C50412 Malignant neoplasm of upper-outer quadrant of left female breast: Secondary | ICD-10-CM

## 2019-02-25 DIAGNOSIS — D696 Thrombocytopenia, unspecified: Secondary | ICD-10-CM | POA: Diagnosis not present

## 2019-02-25 DIAGNOSIS — Z7984 Long term (current) use of oral hypoglycemic drugs: Secondary | ICD-10-CM | POA: Diagnosis not present

## 2019-02-25 DIAGNOSIS — Z5112 Encounter for antineoplastic immunotherapy: Secondary | ICD-10-CM | POA: Diagnosis not present

## 2019-02-25 DIAGNOSIS — Z170 Estrogen receptor positive status [ER+]: Secondary | ICD-10-CM | POA: Diagnosis not present

## 2019-02-25 DIAGNOSIS — Z881 Allergy status to other antibiotic agents status: Secondary | ICD-10-CM | POA: Insufficient documentation

## 2019-02-25 DIAGNOSIS — Z79899 Other long term (current) drug therapy: Secondary | ICD-10-CM | POA: Diagnosis not present

## 2019-02-25 DIAGNOSIS — D6481 Anemia due to antineoplastic chemotherapy: Secondary | ICD-10-CM

## 2019-02-25 DIAGNOSIS — Z9882 Breast implant status: Secondary | ICD-10-CM | POA: Insufficient documentation

## 2019-02-25 DIAGNOSIS — Z886 Allergy status to analgesic agent status: Secondary | ICD-10-CM | POA: Diagnosis not present

## 2019-02-25 DIAGNOSIS — E119 Type 2 diabetes mellitus without complications: Secondary | ICD-10-CM | POA: Insufficient documentation

## 2019-02-25 DIAGNOSIS — R234 Changes in skin texture: Secondary | ICD-10-CM | POA: Diagnosis not present

## 2019-02-25 DIAGNOSIS — Z803 Family history of malignant neoplasm of breast: Secondary | ICD-10-CM

## 2019-02-25 DIAGNOSIS — Z9013 Acquired absence of bilateral breasts and nipples: Secondary | ICD-10-CM | POA: Diagnosis not present

## 2019-02-25 LAB — CMP (CANCER CENTER ONLY)
ALT: 19 U/L (ref 0–44)
AST: 19 U/L (ref 15–41)
Albumin: 4.2 g/dL (ref 3.5–5.0)
Alkaline Phosphatase: 80 U/L (ref 38–126)
Anion gap: 10 (ref 5–15)
BUN: 20 mg/dL (ref 8–23)
CO2: 24 mmol/L (ref 22–32)
Calcium: 9.5 mg/dL (ref 8.9–10.3)
Chloride: 106 mmol/L (ref 98–111)
Creatinine: 0.82 mg/dL (ref 0.44–1.00)
GFR, Est AFR Am: >60 mL/min (ref >60)
GFR, Estimated: >60 mL/min (ref >60)
Glucose, Bld: 99 mg/dL (ref 70–99)
Potassium: 4.1 mmol/L (ref 3.5–5.1)
Sodium: 140 mmol/L (ref 135–145)
Total Bilirubin: 0.5 mg/dL (ref 0.3–1.2)
Total Protein: 7.2 g/dL (ref 6.5–8.1)

## 2019-02-25 LAB — CBC WITH DIFFERENTIAL (CANCER CENTER ONLY)
Abs Immature Granulocytes: 0.01 10*3/uL (ref 0.00–0.07)
Basophils Absolute: 0 10*3/uL (ref 0.0–0.1)
Basophils Relative: 0 %
Eosinophils Absolute: 0 10*3/uL (ref 0.0–0.5)
Eosinophils Relative: 1 %
HCT: 23.7 % — ABNORMAL LOW (ref 36.0–46.0)
Hemoglobin: 8.2 g/dL — ABNORMAL LOW (ref 12.0–15.0)
Immature Granulocytes: 0 %
Lymphocytes Relative: 56 %
Lymphs Abs: 1.4 10*3/uL (ref 0.7–4.0)
MCH: 37.6 pg — ABNORMAL HIGH (ref 26.0–34.0)
MCHC: 34.6 g/dL (ref 30.0–36.0)
MCV: 108.7 fL — ABNORMAL HIGH (ref 80.0–100.0)
Monocytes Absolute: 0.3 10*3/uL (ref 0.1–1.0)
Monocytes Relative: 12 %
Neutro Abs: 0.8 10*3/uL — ABNORMAL LOW (ref 1.7–7.7)
Neutrophils Relative %: 31 %
Platelet Count: 92 10*3/uL — ABNORMAL LOW (ref 150–400)
RBC: 2.18 MIL/uL — ABNORMAL LOW (ref 3.87–5.11)
RDW: 22.2 % — ABNORMAL HIGH (ref 11.5–15.5)
WBC Count: 2.5 10*3/uL — ABNORMAL LOW (ref 4.0–10.5)
nRBC: 0 % (ref 0.0–0.2)

## 2019-02-25 LAB — TYPE AND SCREEN
ABO/RH(D): A POS
Antibody Screen: POSITIVE

## 2019-02-25 LAB — SAMPLE TO BLOOD BANK

## 2019-02-25 LAB — PLATELET BY CITRATE

## 2019-03-02 ENCOUNTER — Encounter (HOSPITAL_COMMUNITY): Payer: Self-pay

## 2019-03-02 NOTE — Progress Notes (Signed)
Patient Care Team: [name], MD as PCP - General (Family Medicine) Birder [name], MD as Referring Physician (Ophthalmology) [name], MD as Consulting Physician (Dermatology) [name], MD as Consulting Physician (Orthopedic Surgery) [name], MD as Consulting Physician (General Surgery) [name], MD as Consulting Physician (Dentistry) [name], MD as Consulting Physician (General Surgery) [name], MD as Consulting Physician (Hematology and Oncology) [name], MD as Attending Physician (Radiation Oncology)  DIAGNOSIS:    ICD-10-CM   1. Malignant neoplasm of upper-outer quadrant of left breast in female, estrogen receptor positive ([place])  C50.412    Z17.0     SUMMARY OF ONCOLOGIC HISTORY: Oncology History  Malignant neoplasm of upper-outer quadrant of left breast in female, estrogen receptor positive ([place])  09/14/2018 Initial Diagnosis   With prior history of breast implants that were ruptured/replaced, patient had cellulitis LIQ left breast for 1 week, and UOQ asymmetry 1.5 cm, MRI right breast biopsy benign, left breast numerous masses 7 cm skin thickening: 2 biopsies from left breast: Grade 2-3 IDC ER PR positive, HER-2 positive, Ki-67 50%   09/16/2018 Cancer Staging   Staging form: Breast, AJCC 8th Edition - Clinical stage from 09/16/2018: Stage IIIB (cT4, cN0, cM0, G3, ER+, PR+, HER2+) - Signed by [name], MD on 09/16/2018   09/29/2018 -  Neo-Adjuvant Chemotherapy   TCHP q 3 weeks   01/19/2019 Breast MRI   Significant interval improvement, the multiple left breast masses and areas of non mass enhancement in all 4 quadrants have decreased in size and extent. The mass previously measuring 1.4 cm with diffuse enhancement now measures 9 mm with heterogeneous areas of enhancement. Current greatest transverse dimension is 5.4 x 3.9 cm, previously 7 cm x 5cm. 2. 4 mm masslike focus of enhancement at the base of the right  nipple is unchanged.   02/10/2019 -  Chemotherapy   The patient had trastuzumab (HERCEPTIN) 450 mg in sodium chloride 0.9 % 250 mL chemo infusion, 420 mg (100 % of original dose 6 mg/kg), Intravenous,  Once, 1 of 5 cycles Dose modification: 6 mg/kg (original dose 6 mg/kg, Cycle 1, Reason: Provider Judgment) Administration: 450 mg (02/10/2019)  for chemotherapy treatment.      CHIEF COMPLIANT: Follow-up of Herceptin maintenance  INTERVAL HISTORY: Katie [name] is a 73 y.o. with above-mentioned history of HER-2 positive left breast cancer who completed neoadjuvant chemotherapy with TCHP. She received one unit of PRBCs on 01/22/19. She contact the clinic on 01/27/19 about bloody nipple discharge x3-4 days. Surgery was postponed due to low counts. Labs on 02/25/19 showed: WBC 2.5, ANC 0.8, Hg 8.2, HCT 23.7, platelets 92. She presents to the clinic today for follow-up. Her surgery was postponed due to thrombocytopenia.  Today her platelet count is improved to 160.  Diarrhea has resolved.  REVIEW OF SYSTEMS:   Constitutional: Denies fevers, chills or abnormal weight loss Eyes: Denies blurriness of vision Ears, nose, mouth, throat, and face: Denies mucositis or sore throat Respiratory: Denies cough, dyspnea or wheezes Cardiovascular: Denies palpitation, chest discomfort Gastrointestinal: Denies nausea, heartburn or change in bowel habits Skin: Denies abnormal skin rashes Lymphatics: Denies new lymphadenopathy or easy bruising Neurological: Denies numbness, tingling or new weaknesses Behavioral/Psych: Mood is stable, no new changes  Extremities: No lower extremity edema Breast: denies any pain or lumps or nodules in either breasts All other systems were reviewed with the patient and are negative.  I have reviewed the past medical history, past surgical history, social history and  family history with the patient and they are unchanged from previous note.  ALLERGIES:  is allergic to erythromycin;  ciprofloxacin; daypro [oxaprozin]; enalapril maleate; nsaids; and vioxx [rofecoxib].  MEDICATIONS:  Current Outpatient Medications  Medication Sig Dispense Refill  . amLODipine (NORVASC) 5 MG tablet Take 1 tablet (5 mg total) by mouth daily. 30 tablet 11  . atorvastatin (LIPITOR) 10 MG tablet TAKE 1 TABLET(10 MG) BY MOUTH DAILY (Patient taking differently: Take 10 mg by mouth every evening. ) 90 tablet 0  . Azelastine HCl 137 MCG/SPRAY SOLN instill 1 spray into each nostril twice a day for 1 week then if needed (Patient taking differently: Place 1 spray into both nostrils daily as needed (congestion). ) 30 mL 5  . cholecalciferol (VITAMIN D) 400 units TABS tablet Take 400 Units by mouth 2 (two) times daily.    . clobetasol (TEMOVATE) 0.05 % external solution Apply 1 application topically 2 (two) times daily as needed (scalp). Reported on 08/16/2015    . fluticasone (FLONASE) 50 MCG/ACT nasal spray INSTILL 2 SPRAYS INTO EACH NOSTRIL ONCE DAILY (Patient taking differently: Place 2 sprays into both nostrils daily. ) 16 g 5  . glucose blood test strip USE TO CHECK BLOOD SUGAR DAILY 50 each 12  . loratadine (CLARITIN) 10 MG tablet Take 10 mg by mouth daily.    . metFORMIN (GLUCOPHAGE-XR) 500 MG 24 hr tablet Take 1 tablet (500 mg total) by mouth daily with breakfast. (Patient taking differently: Take 500 mg by mouth at bedtime. ) 90 tablet 3  . metroNIDAZOLE (METROCREAM) 0.75 % cream Apply 1 application topically 2 (two) times daily as needed (rosacea).     . Multiple Vitamin (MULTIVITAMIN WITH MINERALS) TABS tablet Take 1 tablet by mouth daily.    [name] triamcinolone cream (KENALOG) 0.1 % Apply 1 application topically 2 (two) times daily. (Patient taking differently: Apply 1 application topically 2 (two) times daily as needed (skin rashes). ) 15 g 0  . valACYclovir (VALTREX) 500 MG tablet Take 1 tablet (500 mg total) by mouth 2 (two) times daily. (Patient taking differently: Take 500 mg by mouth 2 (two)  times daily as needed (flare up). ) 6 tablet 2   No current facility-administered medications for this visit.     PHYSICAL EXAMINATION: ECOG PERFORMANCE STATUS: 1 - Symptomatic but completely ambulatory  Vitals:   03/03/19 1348  BP: (!) 157/50  Pulse: 74  Resp: 18  Temp: 98.7 F (37.1 C)  SpO2: 99%   Filed Weights   03/03/19 1348  Weight: 154 lb (69.9 kg)    Physical exam not done due to COVID-19 precautions LABORATORY DATA:  I have reviewed the data as listed CMP Latest Ref Rng & Units 02/25/2019 02/15/2019 01/20/2019  Glucose 70 - 99 mg/dL 99 115(H) 96  BUN 8 - 23 mg/dL 20 20 10   Creatinine 0.44 - 1.00 mg/dL 0.82 0.78 0.71  Sodium 135 - 145 mmol/L 140 140 140  Potassium 3.5 - 5.1 mmol/L 4.1 4.2 3.8  Chloride 98 - 111 mmol/L 106 107 108  CO2 22 - 32 mmol/L 24 24 24   Calcium 8.9 - 10.3 mg/dL 9.5 9.5 8.8(L)  Total Protein 6.5 - 8.1 g/dL 7.2 - 6.3(L)  Total Bilirubin 0.3 - 1.2 mg/dL 0.5 - 0.3  Alkaline Phos 38 - 126 U/L 80 - 60  AST 15 - 41 U/L 19 - 17  ALT 0 - 44 U/L 19 - 17    Lab Results  Component Value Date  WBC 2.9 (L) 03/03/2019   HGB 8.8 (L) 03/03/2019   HCT 25.9 (L) 03/03/2019   MCV 111.2 (H) 03/03/2019   PLT 160 03/03/2019   NEUTROABS 1.2 (L) 03/03/2019    ASSESSMENT & PLAN:  Malignant neoplasm of upper-outer quadrant of left breast in female, estrogen receptor positive ([place]) With prior history of breast implants that were ruptured/replaced, patient had cellulitis LIQ left breast for 1 week, and UOQ asymmetry 1.5 cm, MRI right breast biopsy benign, left breast numerous masses 7 cm skin thickening: 2 biopsies from left breast: Grade 2-3 IDC ER PR positive, HER-2 positive, Ki-67 50% Clinical stage: T4 N0 M0 stage IIIb  Treatment plan 1. Neoadjuvant chemotherapy with TCH Perjeta 6 cycles completed 12/30/2018 followed by Herceptin/ Perjeta vs Kadcyla maintenance for 1 year 2. Followed by mastectomy with sentinel lymph node study 3. Followed by adjuvant  radiation therapy --------------------------------------------------------------- Breast MRI 01/19/2019: Significant improvement.  Index lesion previously 1.4 cm is 0.9 cm.  Heterogeneous enhancement previously 7 x 5 cm is 5.4 x 3.9 cm.  Mastectomy is been delayed because of pancytopenia.  Hypomagnesemia: IV magnesium replacement Current treatment: Herceptin maintenance    Plan is either Herceptin maintenance versus Kadcyla maintenance I will see her back in 3 weeks for next treatment  No orders of the defined types were placed in this encounter.  The patient has a good understanding of the overall plan. she agrees with it. she will call with any problems that may develop before the next visit here.  [name], MD 03/03/2019  Julious Oka Dorshimer am acting as scribe for Dr. [name].  I have reviewed the above documentation for accuracy and completeness, and I agree with the above.

## 2019-03-02 NOTE — Progress Notes (Addendum)
[id number] - [name], [place] - [place] AT [place] [address] [place] [address] Phone: [phone] Fax: [phone]      Your procedure is scheduled on July 13th.  Report to [facility] Main Entrance "A" at 11:45 A.M., and check in at the Admitting office.  Call this number if you have problems the morning of surgery:  [phone]  Call [phone] if you have any questions prior to your surgery date Monday-Friday 8am-4pm    Remember:  Do not eat after midnight the night before your surgery  You may drink clear liquids until 10:45 AM the morning of your surgery.   Clear liquids allowed are: Water, Non-Citrus Juices (without pulp), Carbonated Beverages, Clear Tea, Black Coffee Only, and Gatorade  Please complete your PRE-SURGERY G2 Gatorade that was provided to you by 10:45 AM the morning of surgery.  Please, if able, drink it in one setting. DO NOT SIP.   Take these medicines the morning of surgery with A SIP OF WATER   Amlodipine (Norvasc)  Nasal Spray - if needed  Flonase - if needed  Claritin - if needed  7 days prior to surgery STOP taking any Aspirin (unless otherwise instructed by your surgeon), Aleve, Naproxen, Ibuprofen, Motrin, Advil, Goody's, BC's, all herbal medications, fish oil, and all vitamins.   WHAT DO I DO ABOUT MY DIABETES MEDICATION?   [name] Do not take oral diabetes medicines (pills) the morning of surgery. - Metformin   How to Manage Your Diabetes Before and After Surgery  Why is it important to control my blood sugar before and after surgery? . Improving blood sugar levels before and after surgery helps healing and can limit problems. . A way of improving blood sugar control is eating a healthy diet by: o  Eating less sugar and carbohydrates o  Increasing activity/exercise o  Talking with your doctor about reaching your blood sugar goals . High blood sugars (greater  than 180 mg/dL) can raise your risk of infections and slow your recovery, so you will need to focus on controlling your diabetes during the weeks before surgery. . Make sure that the doctor who takes care of your diabetes knows about your planned surgery including the date and location.  How do I manage my blood sugar before surgery? . Check your blood sugar at least 4 times a day, starting 2 days before surgery, to make sure that the level is not too high or low. o Check your blood sugar the morning of your surgery when you wake up and every 2 hours until you get to the Short Stay unit. . If your blood sugar is less than 70 mg/dL, you will need to treat for low blood sugar: o Do not take insulin. o Treat a low blood sugar (less than 70 mg/dL) with  cup of clear juice (cranberry or apple), 4 glucose tablets, OR glucose gel. o Recheck blood sugar in 15 minutes after treatment (to make sure it is greater than 70 mg/dL). If your blood sugar is not greater than 70 mg/dL on recheck, call [phone] for further instructions. . Report your blood sugar to the short stay nurse when you get to Short Stay.  . If you are admitted to the hospital after surgery: o Your blood sugar will be checked by the staff and you will probably be given insulin after surgery (instead of oral diabetes medicines) to make sure you have good  blood sugar levels. o The goal for blood sugar control after surgery is 80-180 mg/dL.   The Morning of Surgery  Do not wear jewelry, make-up or nail polish.  Do not wear lotions, powders, or perfumes, or deodorant  Do not shave 48 hours prior to surgery.    Do not bring valuables to the hospital.  [facility] is not responsible for any belongings or valuables.  If you are a smoker, DO NOT Smoke 24 hours prior to surgery IF you wear a CPAP at night please bring your mask, tubing, and machine the morning of surgery   Remember that you must have someone to transport you home after  your surgery, and remain with you for 24 hours if you are discharged the same day.   Contacts, glasses, hearing aids, dentures or bridgework may not be worn into surgery.    Leave your suitcase in the car.  After surgery it may be brought to your room.  For patients admitted to the hospital, discharge time will be determined by your treatment team.  Patients discharged the day of surgery will not be allowed to drive home.    Special instructions:   [place]- Preparing For Surgery  Before surgery, you can play an important role. Because skin is not sterile, your skin needs to be as free of germs as possible. You can reduce the number of germs on your skin by washing with CHG (chlorahexidine gluconate) Soap before surgery.  CHG is an antiseptic cleaner which kills germs and bonds with the skin to continue killing germs even after washing.    Oral Hygiene is also important to reduce your risk of infection.  Remember - BRUSH YOUR TEETH THE MORNING OF SURGERY WITH YOUR REGULAR TOOTHPASTE  Please do not use if you have an allergy to CHG or antibacterial soaps. If your skin becomes reddened/irritated stop using the CHG.  Do not shave (including legs and underarms) for at least 48 hours prior to first CHG shower. It is OK to shave your face.  Please follow these instructions carefully.   1. Shower the NIGHT BEFORE SURGERY and the MORNING OF SURGERY with CHG Soap.   2. If you chose to wash your hair, wash your hair first as usual with your normal shampoo.  3. After you shampoo, rinse your hair and body thoroughly to remove the shampoo.  4. Use CHG as you would any other liquid soap. You can apply CHG directly to the skin and wash gently with a scrungie or a clean washcloth.   5. Apply the CHG Soap to your body ONLY FROM THE NECK DOWN.  Do not use on open wounds or open sores. Avoid contact with your eyes, ears, mouth and genitals (private parts). Wash Face and genitals (private parts)   with your normal soap.   6. Wash thoroughly, paying special attention to the area where your surgery will be performed.  7. Thoroughly rinse your body with warm water from the neck down.  8. DO NOT shower/wash with your normal soap after using and rinsing off the CHG Soap.  9. Pat yourself dry with a CLEAN TOWEL.  10. Wear CLEAN PAJAMAS to bed the night before surgery, wear comfortable clothes the morning of surgery  11. Place CLEAN SHEETS on your bed the night of your first shower and DO NOT SLEEP WITH PETS.   Day of Surgery:  Do not apply any deodorants/lotions. Please shower the morning of surgery with the CHG soap  Please wear clean clothes to the hospital/surgery center.   Remember to brush your teeth WITH YOUR REGULAR TOOTHPASTE.   Please read over the following fact sheets that you were given.

## 2019-03-03 ENCOUNTER — Encounter (HOSPITAL_COMMUNITY): Payer: Self-pay

## 2019-03-03 ENCOUNTER — Inpatient Hospital Stay: Payer: Medicare Other

## 2019-03-03 ENCOUNTER — Other Ambulatory Visit: Payer: Self-pay

## 2019-03-03 ENCOUNTER — Encounter (HOSPITAL_COMMUNITY)
Admission: RE | Admit: 2019-03-03 | Discharge: 2019-03-03 | Disposition: A | Discharge status: Home / Self Care | Payer: Medicare Other | Source: Ambulatory Visit | Attending: General Surgery | Admitting: General Surgery

## 2019-03-03 ENCOUNTER — Inpatient Hospital Stay (HOSPITAL_BASED_OUTPATIENT_CLINIC_OR_DEPARTMENT_OTHER): Payer: Medicare Other | Admitting: Hematology and Oncology

## 2019-03-03 DIAGNOSIS — Z886 Allergy status to analgesic agent status: Secondary | ICD-10-CM | POA: Diagnosis not present

## 2019-03-03 DIAGNOSIS — R234 Changes in skin texture: Secondary | ICD-10-CM

## 2019-03-03 DIAGNOSIS — Z9882 Breast implant status: Secondary | ICD-10-CM

## 2019-03-03 DIAGNOSIS — D696 Thrombocytopenia, unspecified: Secondary | ICD-10-CM

## 2019-03-03 DIAGNOSIS — C50412 Malignant neoplasm of upper-outer quadrant of left female breast: Secondary | ICD-10-CM | POA: Diagnosis not present

## 2019-03-03 DIAGNOSIS — E119 Type 2 diabetes mellitus without complications: Secondary | ICD-10-CM | POA: Diagnosis not present

## 2019-03-03 DIAGNOSIS — Z170 Estrogen receptor positive status [ER+]: Secondary | ICD-10-CM

## 2019-03-03 DIAGNOSIS — Z95828 Presence of other vascular implants and grafts: Secondary | ICD-10-CM

## 2019-03-03 DIAGNOSIS — E785 Hyperlipidemia, unspecified: Secondary | ICD-10-CM | POA: Diagnosis not present

## 2019-03-03 DIAGNOSIS — Z7984 Long term (current) use of oral hypoglycemic drugs: Secondary | ICD-10-CM

## 2019-03-03 DIAGNOSIS — Z881 Allergy status to other antibiotic agents status: Secondary | ICD-10-CM | POA: Diagnosis not present

## 2019-03-03 DIAGNOSIS — Z1159 Encounter for screening for other viral diseases: Secondary | ICD-10-CM | POA: Diagnosis not present

## 2019-03-03 DIAGNOSIS — Z5112 Encounter for antineoplastic immunotherapy: Secondary | ICD-10-CM | POA: Diagnosis not present

## 2019-03-03 DIAGNOSIS — I10 Essential (primary) hypertension: Secondary | ICD-10-CM | POA: Diagnosis not present

## 2019-03-03 DIAGNOSIS — Z79899 Other long term (current) drug therapy: Secondary | ICD-10-CM

## 2019-03-03 HISTORY — DX: Aneurysm of unspecified site: I72.9

## 2019-03-03 HISTORY — DX: Malignant (primary) neoplasm, unspecified: C80.1

## 2019-03-03 LAB — CBC WITH DIFFERENTIAL (CANCER CENTER ONLY)
Abs Immature Granulocytes: 0 10*3/uL (ref 0.00–0.07)
Basophils Absolute: 0 10*3/uL (ref 0.0–0.1)
Basophils Relative: 0 %
Eosinophils Absolute: 0 10*3/uL (ref 0.0–0.5)
Eosinophils Relative: 1 %
HCT: 25.9 % — ABNORMAL LOW (ref 36.0–46.0)
Hemoglobin: 8.8 g/dL — ABNORMAL LOW (ref 12.0–15.0)
Immature Granulocytes: 0 %
Lymphocytes Relative: 46 %
Lymphs Abs: 1.3 10*3/uL (ref 0.7–4.0)
MCH: 37.8 pg — ABNORMAL HIGH (ref 26.0–34.0)
MCHC: 34 g/dL (ref 30.0–36.0)
MCV: 111.2 fL — ABNORMAL HIGH (ref 80.0–100.0)
Monocytes Absolute: 0.4 10*3/uL (ref 0.1–1.0)
Monocytes Relative: 14 %
Neutro Abs: 1.2 10*3/uL — ABNORMAL LOW (ref 1.7–7.7)
Neutrophils Relative %: 39 %
Platelet Count: 160 10*3/uL (ref 150–400)
RBC: 2.33 MIL/uL — ABNORMAL LOW (ref 3.87–5.11)
RDW: 21.4 % — ABNORMAL HIGH (ref 11.5–15.5)
WBC Count: 2.9 10*3/uL — ABNORMAL LOW (ref 4.0–10.5)
nRBC: 0 % (ref 0.0–0.2)

## 2019-03-03 LAB — GLUCOSE, CAPILLARY: Glucose-Capillary: 114 mg/dL — ABNORMAL HIGH (ref 70–99)

## 2019-03-03 LAB — PLATELET BY CITRATE

## 2019-03-03 MED ORDER — SODIUM CHLORIDE 0.9% FLUSH
10.0000 mL | INTRAVENOUS | Status: DC | PRN
  Administered 2019-03-03: 10 mL
  Filled 2019-03-03: qty 10

## 2019-03-03 MED ORDER — ACETAMINOPHEN 325 MG PO TABS
ORAL_TABLET | ORAL | Status: AC
  Filled 2019-03-03: qty 2

## 2019-03-03 MED ORDER — SODIUM CHLORIDE 0.9 % IV SOLN
Freq: Once | INTRAVENOUS | Status: AC
  Administered 2019-03-03: 15:00:00 via INTRAVENOUS
  Filled 2019-03-03: qty 250

## 2019-03-03 MED ORDER — TRASTUZUMAB CHEMO 150 MG IV SOLR
450.0000 mg | Freq: Once | INTRAVENOUS | Status: AC
  Administered 2019-03-03: 450 mg via INTRAVENOUS
  Filled 2019-03-03: qty 21.43

## 2019-03-03 MED ORDER — HEPARIN SOD (PORK) LOCK FLUSH 100 UNIT/ML IV SOLN
500.0000 [IU] | Freq: Once | INTRAVENOUS | Status: AC | PRN
  Administered 2019-03-03: 500 [IU]
  Filled 2019-03-03: qty 5

## 2019-03-03 MED ORDER — ACETAMINOPHEN 325 MG PO TABS
650.0000 mg | ORAL_TABLET | Freq: Once | ORAL | Status: AC
  Administered 2019-03-03: 650 mg via ORAL

## 2019-03-03 MED ORDER — DIPHENHYDRAMINE HCL 25 MG PO CAPS
ORAL_CAPSULE | ORAL | Status: AC
  Filled 2019-03-03: qty 2

## 2019-03-03 MED ORDER — SODIUM CHLORIDE 0.9% FLUSH
10.0000 mL | Freq: Once | INTRAVENOUS | Status: AC
  Administered 2019-03-03: 10 mL
  Filled 2019-03-03: qty 10

## 2019-03-03 MED ORDER — DIPHENHYDRAMINE HCL 25 MG PO CAPS
50.0000 mg | ORAL_CAPSULE | Freq: Once | ORAL | Status: AC
  Administered 2019-03-03: 50 mg via ORAL

## 2019-03-03 NOTE — Progress Notes (Signed)
Okay to treat with Echo from 10/2018 per Dr. Lindi Adie.  Orders placed for upcoming echo.

## 2019-03-03 NOTE — Patient Instructions (Signed)

## 2019-03-03 NOTE — Patient Instructions (Signed)
[facility] Discharge Instructions for Patients Receiving Chemotherapy  Today you received the following chemotherapy agents Herceptin.  To help prevent nausea and vomiting after your treatment, we encourage you to take your nausea medication.   If you develop nausea and vomiting that is not controlled by your nausea medication, call the clinic.   BELOW ARE SYMPTOMS THAT SHOULD BE REPORTED IMMEDIATELY:  *FEVER GREATER THAN 100.5 F  *CHILLS WITH OR WITHOUT FEVER  NAUSEA AND VOMITING THAT IS NOT CONTROLLED WITH YOUR NAUSEA MEDICATION  *UNUSUAL SHORTNESS OF BREATH  *UNUSUAL BRUISING OR BLEEDING  TENDERNESS IN MOUTH AND THROAT WITH OR WITHOUT PRESENCE OF ULCERS  *URINARY PROBLEMS  *BOWEL PROBLEMS  UNUSUAL RASH Items with * indicate a potential emergency and should be followed up as soon as possible.  Feel free to call the clinic should you have any questions or concerns. The clinic phone number is [phone].  Please show the CHEMO ALERT CARD at check-in to the Emergency Department and triage nurse.   

## 2019-03-03 NOTE — Progress Notes (Addendum)
PCP - Eliezer Lofts Cardiologist - na Neurology: Elige Ko Onology: [place]  Chest x-ray - na EKG - 5/20 Stress Test - 10 yrs. ago ECHO - 2/20 Cardiac Cath - na  Sleep Study - na CPAP -   Fasting Blood Sugar - 110-115 Checks Blood Sugar __1-2___ times a day  Blood Thinner Instructions: Aspirin Instructions:na  Anesthesia review: No labs today per James,PA. Pt. To have labs at [facility] today.  Patient denies shortness of breath, fever, cough and chest pain at PAT appointment   Patient verbalized understanding of instructions that were given to them at the PAT appointment. Patient was also instructed that they will need to review over the PAT instructions again at home before surgery.

## 2019-03-04 ENCOUNTER — Telehealth: Payer: Self-pay | Admitting: Hematology and Oncology

## 2019-03-04 ENCOUNTER — Ambulatory Visit (HOSPITAL_COMMUNITY)
Admission: RE | Admit: 2019-03-04 | Discharge: 2019-03-04 | Disposition: A | Discharge status: Home / Self Care | Payer: Medicare Other | Source: Ambulatory Visit | Attending: Hematology and Oncology | Admitting: Hematology and Oncology

## 2019-03-04 ENCOUNTER — Other Ambulatory Visit (HOSPITAL_COMMUNITY)
Admission: RE | Admit: 2019-03-04 | Discharge: 2019-03-04 | Disposition: A | Discharge status: Home / Self Care | Payer: Medicare Other | Source: Ambulatory Visit | Attending: General Surgery | Admitting: General Surgery

## 2019-03-04 DIAGNOSIS — E119 Type 2 diabetes mellitus without complications: Secondary | ICD-10-CM | POA: Insufficient documentation

## 2019-03-04 DIAGNOSIS — I10 Essential (primary) hypertension: Secondary | ICD-10-CM | POA: Diagnosis not present

## 2019-03-04 DIAGNOSIS — C50412 Malignant neoplasm of upper-outer quadrant of left female breast: Secondary | ICD-10-CM | POA: Diagnosis not present

## 2019-03-04 DIAGNOSIS — E785 Hyperlipidemia, unspecified: Secondary | ICD-10-CM | POA: Diagnosis not present

## 2019-03-04 DIAGNOSIS — Z170 Estrogen receptor positive status [ER+]: Secondary | ICD-10-CM | POA: Diagnosis not present

## 2019-03-04 DIAGNOSIS — Z1159 Encounter for screening for other viral diseases: Secondary | ICD-10-CM | POA: Diagnosis not present

## 2019-03-04 NOTE — Progress Notes (Addendum)
Anesthesia Chart Review:  Case: [id number] Date/Time: 03/08/19 1330   Procedure: BILATERAL MASTECTOMIES WITH LEFT AXILLARY SENTINEL LYMPH NODE BIOPSY AND BLUE DYE INJECTION (Bilateral Breast)   Anesthesia type: General   Pre-op diagnosis: BREAST CANCER   Location: [facility]OM 02 / [facility]   Surgeon: Rolm Bookbinder, MD      DISCUSSION: Patient is a 73 year old female scheduled for the above procedure. Surgery was initially scheduled in late June, but delayed due to thrombocytopenia (PLT 33K).   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 Received Herceptin 03/03/19.   She saw oncologist Dr. Lindi Adie on 03/03/19 with repeat CBC. PLT count up to 160K. H/H up and stable for the last few months, but still low at 8.8/25.9. This was called to [facility] triage nurse Sunday Spillers. She will have Dr. [name] review, and asked to let me know if he has additional recommendations (ie, T&S or have oncology address prior to surgery). She is getting a routine echo on 03/04/19 for Hercpetin therapy. Chart will be left for follow-up regarding echo results.  ADDENDUM 03/05/19 12:51 PM: 03/04/19 echo showed normal LVEF. I have not received any additional recommendations from Dr. [name]. If he or anesthesiologist would like a T&S preoperatively then this will need to be ordered on the day of surgery.    VS: BP (!) 144/48   Pulse 72   Temp 36.6 C   Resp 20   Ht 5\' 2"  (1.575 m)   Wt 68.9 kg   LMP 08/26/1992 (Approximate)   SpO2 99%   BMI 27.80 kg/m    PROVIDERS: [name], MD is PCP [name], MD is HEM-ONC. Last visit 03/03/19. [name], MD is RAD-ONC. Lerry Liner, MD is neurosurgeon ([place]). 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 Six month imaging planned.   LABS: Preoperative labs noted. Labs at [facility] from 03/03/19 show WBC  2.9, H/H 8.8/25.9, PLT 160. CMET WNL 02/25/19.   CBC Latest Ref Rng & Units 03/03/2019 02/25/2019 02/16/2019  WBC 4.0 - 10.5 K/uL 2.9(L) 2.5(L) 2.7(L)  Hemoglobin 12.0 - 15.0 g/dL 8.8(L) 8.2(L) 7.8(L)  Hematocrit 36.0 - 46.0 % 25.9(L) 23.7(L) 21.9(L)  Platelets 150 - 400 K/uL 160 92(L) 34(L)    IMAGES: MRA head 01/05/19: IMPRESSION: 1. 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 2. Mild to moderate for age nonspecific cerebral white matter signal changes, most commonly due to chronic small vessel disease. Note that early metastatic disease to the brain cannot be excluded in the absence of IV contrast. 3. Mild for age intracranial atherosclerosis. No intracranial artery stenosis or major circle-of-Willis branch occlusion identified. 4. Mild mastoid effusions, typically postinflammatory.  CT chest/abd/pelvis 09/28/18: IMPRESSION: 1. No lymphadenopathy or other findings highly suspicious for metastatic disease in the chest, abdomen or pelvis. 2. Subcapsular small low-attenuation right liver lobe lesion adjacent to the gallbladder fossa, indeterminate. While potentially representing a benign focus of subcapsular fat, metastatic disease cannot be excluded on the basis of this scan and further characterization is recommended with MRI abdomen without and with IV contrast at this time. 3. Solitary tiny left upper lobe solid pulmonary nodule, for which follow-up chest CT is recommended in 3 months. 4. Chronic findings include: Aortic Atherosclerosis (ICD10-I70.0). Mild diffuse colonic diverticulosis.   EKG: 01/05/19: NSR, low voltage QRS, septal infarct (age undetermined).   CV: Echo 03/04/19:  IMPRESSIONS  1. The left ventricle has normal systolic function with an ejection fraction of 60-65%. The cavity size was normal. Left ventricular diastolic Doppler parameters are consistent with pseudonormalization.  2. The right ventricle has  normal systolic function. The cavity was normal. There is no increase in right ventricular wall thickness.  3. No evidence of mitral valve stenosis.  4. Aortic valve regurgitation is trivial by color flow Doppler. No stenosis of the aortic valve.  5. The aortic root and ascending aorta are normal in size and structure.  6. The average left ventricular global longitudinal strain is -24.9 %.  7. The interatrial septum was not assessed.  Carotid US 01/04/11: IMPRESSION:  1. There is small amount of soft plaque formation noted about the carotid  bifurcations bilaterally.  2. No hemodynamically significant stenosis is seen on either side.  3. There is antegrade flow in both vertebrals.    Past Medical History:  Diagnosis Date  . Allergy   . Anemia   . Aneurysm ([place])    in brain, small, not treating-watching   . Blood transfusion without reported diagnosis   . Cancer ([place])    breast 08/2018  . Diverticulitis   . DJD (degenerative joint disease)   . DM type 2 (diabetes mellitus, type 2) ([place])   . Family history of breast cancer   . Fibrocystic breast disease   . GERD (gastroesophageal reflux disease)   . History of chicken pox   . Hypercholesteremia   . Hypertension   . Lichen planopilaris   . Urinary incontinence     Past Surgical History:  Procedure Laterality Date  . BREAST SURGERY  02/1975   Left Breast Biopsy-Fibrocystic Disease  . BREAST SURGERY  10/1981   Bialteral Capsulectomy Breast Surgery  . BREAST SURGERY  07/1998   Replace Bilateral Silicone Implants  . BREAST SURGERY  01/1976   Bilater breast surgery to remove fibrocystic tissue and replace with silicone implants  . CHOLECYSTECTOMY N/A 07/08/2016   Procedure: LAPAROSCOPIC CHOLECYSTECTOMY;  Surgeon: [name], MD;  Location: [facility] ORS;  Service: General;  Laterality: N/A;  . COLONOSCOPY WITH PROPOFOL N/A 11/07/2017   Procedure: COLONOSCOPY WITH PROPOFOL;  Surgeon: Jonathon Bellows, MD;  Location[facility] ENDOSCOPY;   Service: Gastroenterology;  Laterality: N/A;  . DILATION AND CURETTAGE OF UTERUS  01/1994  . ESOPHAGOGASTRODUODENOSCOPY (EGD) WITH PROPOFOL N/A 12/05/2016   Procedure: ESOPHAGOGASTRODUODENOSCOPY (EGD) WITH PROPOFOL;  Surgeon: Jonathon Bellows, MD;  Location: [facility] ENDOSCOPY;  Service: Endoscopy;  Laterality: N/A;  . FINGER SURGERY  2001 & 2003   Trigger Finger  . FOOT SURGERY  1980   To Relieve Pinched Nerve  . FOOT SURGERY  07/2008   Left Foot-Plantar Fasciitis  . JOINT REPLACEMENT  04/19/2013   Hip Replacement-Right  . PLACEMENT OF BREAST IMPLANTS  12/26/2004   Replace Failed Implants  . RHINOPLASTY  03/1964   To Correct Deviated Septum    MEDICATIONS: . amLODipine (NORVASC) 5 MG tablet  . atorvastatin (LIPITOR) 10 MG tablet  . Azelastine HCl 137 MCG/SPRAY SOLN  . cholecalciferol (VITAMIN D) 400 units TABS tablet  . clobetasol (TEMOVATE) 0.05 % external solution  . fluticasone (FLONASE) 50 MCG/ACT nasal spray  . glucose blood test strip  . loratadine (CLARITIN) 10 MG tablet  . metFORMIN (GLUCOPHAGE-XR) 500 MG 24 hr tablet  . metroNIDAZOLE (METROCREAM) 0.75 % cream  . Multiple Vitamin (MULTIVITAMIN WITH MINERALS) TABS tablet  . triamcinolone cream (KENALOG) 0.1 %  . valACYclovir (VALTREX) 500 MG tablet   No current  facility-administered medications for this encounter.     Myra Gianotti, PA-C Surgical Short Stay/Anesthesiology [facility] Phone [phone] [facility] [address] Phone [phone] 03/04/2019 4:44 PM

## 2019-03-04 NOTE — Progress Notes (Signed)
error 

## 2019-03-04 NOTE — Telephone Encounter (Signed)
Patient is at an appointment I will mail schedule

## 2019-03-04 NOTE — Progress Notes (Signed)
  Echocardiogram 2D Echocardiogram has been performed.  Katie [name] 03/04/2019, 9:34 AM

## 2019-03-05 LAB — SARS CORONAVIRUS 2 (TAT 6-24 HRS): SARS Coronavirus 2: NEGATIVE

## 2019-03-08 ENCOUNTER — Encounter (HOSPITAL_COMMUNITY): Payer: Self-pay

## 2019-03-08 ENCOUNTER — Encounter (HOSPITAL_COMMUNITY)
Admission: RE | Disposition: A | Discharge status: Home / Self Care | Payer: Self-pay | Source: Home / Self Care | Attending: General Surgery

## 2019-03-08 ENCOUNTER — Encounter (HOSPITAL_COMMUNITY)
Admission: RE | Admit: 2019-03-08 | Discharge: 2019-03-08 | Disposition: A | Discharge status: Home / Self Care | Payer: Medicare Other | Source: Ambulatory Visit | Attending: General Surgery | Admitting: General Surgery

## 2019-03-08 ENCOUNTER — Ambulatory Visit (HOSPITAL_COMMUNITY): Payer: Medicare Other | Admitting: Physician Assistant

## 2019-03-08 ENCOUNTER — Other Ambulatory Visit: Payer: Self-pay

## 2019-03-08 ENCOUNTER — Encounter: Payer: Self-pay | Admitting: *Deleted

## 2019-03-08 ENCOUNTER — Observation Stay (HOSPITAL_COMMUNITY)
Admission: RE | Admit: 2019-03-08 | Discharge: 2019-03-09 | Disposition: A | Discharge status: Home / Self Care | Payer: Medicare Other | Attending: General Surgery | Admitting: General Surgery

## 2019-03-08 DIAGNOSIS — N6325 Unspecified lump in the left breast, overlapping quadrants: Secondary | ICD-10-CM

## 2019-03-08 DIAGNOSIS — Z9221 Personal history of antineoplastic chemotherapy: Secondary | ICD-10-CM | POA: Diagnosis not present

## 2019-03-08 DIAGNOSIS — E7800 Pure hypercholesterolemia, unspecified: Secondary | ICD-10-CM | POA: Insufficient documentation

## 2019-03-08 DIAGNOSIS — Z888 Allergy status to other drugs, medicaments and biological substances status: Secondary | ICD-10-CM | POA: Diagnosis not present

## 2019-03-08 DIAGNOSIS — Z818 Family history of other mental and behavioral disorders: Secondary | ICD-10-CM | POA: Diagnosis not present

## 2019-03-08 DIAGNOSIS — D241 Benign neoplasm of right breast: Secondary | ICD-10-CM | POA: Insufficient documentation

## 2019-03-08 DIAGNOSIS — K219 Gastro-esophageal reflux disease without esophagitis: Secondary | ICD-10-CM | POA: Diagnosis not present

## 2019-03-08 DIAGNOSIS — Z881 Allergy status to other antibiotic agents status: Secondary | ICD-10-CM | POA: Insufficient documentation

## 2019-03-08 DIAGNOSIS — Z823 Family history of stroke: Secondary | ICD-10-CM | POA: Diagnosis not present

## 2019-03-08 DIAGNOSIS — Z833 Family history of diabetes mellitus: Secondary | ICD-10-CM | POA: Diagnosis not present

## 2019-03-08 DIAGNOSIS — Z170 Estrogen receptor positive status [ER+]: Secondary | ICD-10-CM | POA: Diagnosis not present

## 2019-03-08 DIAGNOSIS — E119 Type 2 diabetes mellitus without complications: Secondary | ICD-10-CM | POA: Diagnosis not present

## 2019-03-08 DIAGNOSIS — G8918 Other acute postprocedural pain: Secondary | ICD-10-CM | POA: Diagnosis not present

## 2019-03-08 DIAGNOSIS — Z7984 Long term (current) use of oral hypoglycemic drugs: Secondary | ICD-10-CM | POA: Diagnosis not present

## 2019-03-08 DIAGNOSIS — Z8261 Family history of arthritis: Secondary | ICD-10-CM | POA: Diagnosis not present

## 2019-03-08 DIAGNOSIS — Z9049 Acquired absence of other specified parts of digestive tract: Secondary | ICD-10-CM | POA: Insufficient documentation

## 2019-03-08 DIAGNOSIS — C50912 Malignant neoplasm of unspecified site of left female breast: Secondary | ICD-10-CM | POA: Diagnosis present

## 2019-03-08 DIAGNOSIS — Z79899 Other long term (current) drug therapy: Secondary | ICD-10-CM | POA: Insufficient documentation

## 2019-03-08 DIAGNOSIS — Z8249 Family history of ischemic heart disease and other diseases of the circulatory system: Secondary | ICD-10-CM | POA: Diagnosis not present

## 2019-03-08 DIAGNOSIS — C50812 Malignant neoplasm of overlapping sites of left female breast: Secondary | ICD-10-CM | POA: Diagnosis not present

## 2019-03-08 DIAGNOSIS — I10 Essential (primary) hypertension: Secondary | ICD-10-CM | POA: Diagnosis not present

## 2019-03-08 DIAGNOSIS — Z886 Allergy status to analgesic agent status: Secondary | ICD-10-CM | POA: Diagnosis not present

## 2019-03-08 DIAGNOSIS — Z803 Family history of malignant neoplasm of breast: Secondary | ICD-10-CM | POA: Diagnosis not present

## 2019-03-08 DIAGNOSIS — Z832 Family history of diseases of the blood and blood-forming organs and certain disorders involving the immune mechanism: Secondary | ICD-10-CM | POA: Diagnosis not present

## 2019-03-08 DIAGNOSIS — N6011 Diffuse cystic mastopathy of right breast: Secondary | ICD-10-CM | POA: Diagnosis not present

## 2019-03-08 DIAGNOSIS — Z9882 Breast implant status: Secondary | ICD-10-CM | POA: Diagnosis not present

## 2019-03-08 HISTORY — PX: MASTECTOMY W/ SENTINEL NODE BIOPSY: SHX2001

## 2019-03-08 HISTORY — PX: BREAST IMPLANT REMOVAL: SHX5361

## 2019-03-08 LAB — CBC
HCT: 26.5 % — ABNORMAL LOW (ref 36.0–46.0)
Hemoglobin: 9.1 g/dL — ABNORMAL LOW (ref 12.0–15.0)
MCH: 37.9 pg — ABNORMAL HIGH (ref 26.0–34.0)
MCHC: 34.3 g/dL (ref 30.0–36.0)
MCV: 110.4 fL — ABNORMAL HIGH (ref 80.0–100.0)
Platelets: 168 10*3/uL (ref 150–400)
RBC: 2.4 MIL/uL — ABNORMAL LOW (ref 3.87–5.11)
RDW: 19.2 % — ABNORMAL HIGH (ref 11.5–15.5)
WBC: 4.5 10*3/uL (ref 4.0–10.5)
nRBC: 0 % (ref 0.0–0.2)

## 2019-03-08 LAB — GLUCOSE, CAPILLARY
Glucose-Capillary: 111 mg/dL — ABNORMAL HIGH (ref 70–99)
Glucose-Capillary: 130 mg/dL — ABNORMAL HIGH (ref 70–99)
Glucose-Capillary: 138 mg/dL — ABNORMAL HIGH (ref 70–99)
Glucose-Capillary: 277 mg/dL — ABNORMAL HIGH (ref 70–99)

## 2019-03-08 SURGERY — MASTECTOMY WITH SENTINEL LYMPH NODE BIOPSY
Anesthesia: Regional | Site: Breast | Laterality: Bilateral

## 2019-03-08 MED ORDER — METHOCARBAMOL 500 MG PO TABS
500.0000 mg | ORAL_TABLET | Freq: Four times a day (QID) | ORAL | Status: DC | PRN
  Administered 2019-03-08: 500 mg via ORAL

## 2019-03-08 MED ORDER — ROCURONIUM BROMIDE 10 MG/ML (PF) SYRINGE
PREFILLED_SYRINGE | INTRAVENOUS | Status: AC
  Filled 2019-03-08: qty 10

## 2019-03-08 MED ORDER — ONDANSETRON HCL 4 MG/2ML IJ SOLN
INTRAMUSCULAR | Status: DC | PRN
  Administered 2019-03-08: 4 mg via INTRAVENOUS

## 2019-03-08 MED ORDER — SUCCINYLCHOLINE CHLORIDE 200 MG/10ML IV SOSY
PREFILLED_SYRINGE | INTRAVENOUS | Status: AC
  Filled 2019-03-08: qty 10

## 2019-03-08 MED ORDER — BACITRACIN ZINC 500 UNIT/GM EX OINT
TOPICAL_OINTMENT | CUTANEOUS | Status: DC | PRN
  Administered 2019-03-08: 1 via TOPICAL

## 2019-03-08 MED ORDER — FENTANYL CITRATE (PF) 250 MCG/5ML IJ SOLN
INTRAMUSCULAR | Status: AC
  Filled 2019-03-08: qty 5

## 2019-03-08 MED ORDER — FENTANYL CITRATE (PF) 100 MCG/2ML IJ SOLN
100.0000 ug | Freq: Once | INTRAMUSCULAR | Status: AC
  Administered 2019-03-08: 100 ug via INTRAVENOUS
  Filled 2019-03-08: qty 2

## 2019-03-08 MED ORDER — INSULIN ASPART 100 UNIT/ML ~~LOC~~ SOLN
0.0000 [IU] | Freq: Three times a day (TID) | SUBCUTANEOUS | Status: DC
  Administered 2019-03-08: 18:00:00 2 [IU] via SUBCUTANEOUS

## 2019-03-08 MED ORDER — BUPIVACAINE HCL (PF) 0.25 % IJ SOLN
INTRAMUSCULAR | Status: DC | PRN
  Administered 2019-03-08 (×2): 25 mL

## 2019-03-08 MED ORDER — AMLODIPINE BESYLATE 5 MG PO TABS: 5.0000 mg | ORAL_TABLET | Freq: Every day | ORAL | Status: DC

## 2019-03-08 MED ORDER — METHYLENE BLUE 0.5 % INJ SOLN
INTRAVENOUS | Status: AC
  Filled 2019-03-08: qty 10

## 2019-03-08 MED ORDER — HYDRALAZINE HCL 20 MG/ML IJ SOLN: 10.0000 mg | INTRAMUSCULAR | Status: DC | PRN

## 2019-03-08 MED ORDER — EPHEDRINE SULFATE-NACL 50-0.9 MG/10ML-% IV SOSY
PREFILLED_SYRINGE | INTRAVENOUS | Status: DC | PRN
  Administered 2019-03-08: 5 mg via INTRAVENOUS

## 2019-03-08 MED ORDER — HEMOSTATIC AGENTS (NO CHARGE) OPTIME
TOPICAL | Status: DC | PRN
  Administered 2019-03-08: 1 via TOPICAL

## 2019-03-08 MED ORDER — BUPIVACAINE LIPOSOME 1.3 % IJ SUSP
INTRAMUSCULAR | Status: DC | PRN
  Administered 2019-03-08 (×2): 5 mL

## 2019-03-08 MED ORDER — 0.9 % SODIUM CHLORIDE (POUR BTL) OPTIME
TOPICAL | Status: DC | PRN
  Administered 2019-03-08: 1000 mL

## 2019-03-08 MED ORDER — ONDANSETRON HCL 4 MG/2ML IJ SOLN: 4.0000 mg | Freq: Four times a day (QID) | INTRAMUSCULAR | Status: DC | PRN

## 2019-03-08 MED ORDER — OXYCODONE HCL 5 MG PO TABS
ORAL_TABLET | ORAL | Status: AC
  Filled 2019-03-08: qty 2

## 2019-03-08 MED ORDER — LACTATED RINGERS IV SOLN
INTRAVENOUS | Status: DC | PRN
  Administered 2019-03-08 (×2): via INTRAVENOUS

## 2019-03-08 MED ORDER — BACITRACIN ZINC 500 UNIT/GM EX OINT
TOPICAL_OINTMENT | CUTANEOUS | Status: AC
  Filled 2019-03-08: qty 28.35

## 2019-03-08 MED ORDER — METHOCARBAMOL 500 MG PO TABS
ORAL_TABLET | ORAL | Status: AC
  Filled 2019-03-08: qty 1

## 2019-03-08 MED ORDER — AZELASTINE HCL 137 MCG/SPRAY NA SOLN: 1.0000 | Freq: Every day | NASAL | Status: DC | PRN

## 2019-03-08 MED ORDER — MIDAZOLAM HCL 5 MG/5ML IJ SOLN: INTRAMUSCULAR | Status: DC | PRN

## 2019-03-08 MED ORDER — LORATADINE 10 MG PO TABS: 10.0000 mg | ORAL_TABLET | Freq: Every day | ORAL | Status: DC

## 2019-03-08 MED ORDER — LIDOCAINE 2% (20 MG/ML) 5 ML SYRINGE
INTRAMUSCULAR | Status: AC
  Filled 2019-03-08: qty 5

## 2019-03-08 MED ORDER — PHENYLEPHRINE 40 MCG/ML (10ML) SYRINGE FOR IV PUSH (FOR BLOOD PRESSURE SUPPORT)
PREFILLED_SYRINGE | INTRAVENOUS | Status: AC
  Filled 2019-03-08: qty 10

## 2019-03-08 MED ORDER — INSULIN ASPART 100 UNIT/ML ~~LOC~~ SOLN
0.0000 [IU] | Freq: Every day | SUBCUTANEOUS | Status: DC
  Administered 2019-03-08: 21:00:00 3 [IU] via SUBCUTANEOUS

## 2019-03-08 MED ORDER — MORPHINE SULFATE (PF) 2 MG/ML IV SOLN: 2.0000 mg | INTRAVENOUS | Status: DC | PRN

## 2019-03-08 MED ORDER — ACETAMINOPHEN 500 MG PO TABS
1000.0000 mg | ORAL_TABLET | Freq: Four times a day (QID) | ORAL | Status: DC
  Administered 2019-03-08 – 2019-03-09 (×3): 1000 mg via ORAL
  Filled 2019-03-08 (×3): qty 2

## 2019-03-08 MED ORDER — GABAPENTIN 100 MG PO CAPS
100.0000 mg | ORAL_CAPSULE | ORAL | Status: AC
  Administered 2019-03-08: 12:00:00 100 mg via ORAL
  Filled 2019-03-08: qty 1

## 2019-03-08 MED ORDER — FENTANYL CITRATE (PF) 100 MCG/2ML IJ SOLN: INTRAMUSCULAR | Status: DC | PRN

## 2019-03-08 MED ORDER — EPHEDRINE 5 MG/ML INJ
INTRAVENOUS | Status: AC
  Filled 2019-03-08: qty 10

## 2019-03-08 MED ORDER — FLUTICASONE PROPIONATE 50 MCG/ACT NA SUSP
2.0000 | Freq: Every day | NASAL | Status: DC
  Filled 2019-03-08: qty 16

## 2019-03-08 MED ORDER — ACETAMINOPHEN 500 MG PO TABS
1000.0000 mg | ORAL_TABLET | ORAL | Status: AC
  Administered 2019-03-08: 12:00:00 1000 mg via ORAL
  Filled 2019-03-08: qty 2

## 2019-03-08 MED ORDER — OXYCODONE HCL 5 MG PO TABS
5.0000 mg | ORAL_TABLET | ORAL | Status: DC | PRN
  Administered 2019-03-08: 17:00:00 10 mg via ORAL

## 2019-03-08 MED ORDER — PROPOFOL 10 MG/ML IV BOLUS
INTRAVENOUS | Status: AC
  Filled 2019-03-08: qty 20

## 2019-03-08 MED ORDER — SODIUM CHLORIDE (PF) 0.9 % IJ SOLN
INTRAVENOUS | Status: DC | PRN
  Administered 2019-03-08: 5 mL via INTRAMUSCULAR

## 2019-03-08 MED ORDER — ONDANSETRON 4 MG PO TBDP: 4.0000 mg | ORAL_TABLET | Freq: Four times a day (QID) | ORAL | Status: DC | PRN

## 2019-03-08 MED ORDER — SIMETHICONE 80 MG PO CHEW: 40.0000 mg | CHEWABLE_TABLET | Freq: Four times a day (QID) | ORAL | Status: DC | PRN

## 2019-03-08 MED ORDER — SUCCINYLCHOLINE CHLORIDE 200 MG/10ML IV SOSY
PREFILLED_SYRINGE | INTRAVENOUS | Status: DC | PRN
  Administered 2019-03-08: 80 mg via INTRAVENOUS

## 2019-03-08 MED ORDER — SODIUM CHLORIDE (PF) 0.9 % IJ SOLN
INTRAMUSCULAR | Status: AC
  Filled 2019-03-08: qty 10

## 2019-03-08 MED ORDER — ENSURE PRE-SURGERY PO LIQD
296.0000 mL | Freq: Once | ORAL | Status: DC
  Filled 2019-03-08: qty 296

## 2019-03-08 MED ORDER — CEFAZOLIN SODIUM-DEXTROSE 2-4 GM/100ML-% IV SOLN
2.0000 g | INTRAVENOUS | Status: AC
  Administered 2019-03-08: 2 g via INTRAVENOUS
  Filled 2019-03-08: qty 100

## 2019-03-08 MED ORDER — DEXAMETHASONE SODIUM PHOSPHATE 10 MG/ML IJ SOLN
INTRAMUSCULAR | Status: DC | PRN
  Administered 2019-03-08: 8 mg via INTRAVENOUS

## 2019-03-08 MED ORDER — ONDANSETRON HCL 4 MG/2ML IJ SOLN
INTRAMUSCULAR | Status: AC
  Filled 2019-03-08: qty 2

## 2019-03-08 MED ORDER — MIDAZOLAM HCL 2 MG/2ML IJ SOLN
2.0000 mg | Freq: Once | INTRAMUSCULAR | Status: AC
  Administered 2019-03-08: 12:00:00 2 mg via INTRAVENOUS
  Filled 2019-03-08: qty 2

## 2019-03-08 MED ORDER — DEXAMETHASONE SODIUM PHOSPHATE 10 MG/ML IJ SOLN
INTRAMUSCULAR | Status: AC
  Filled 2019-03-08: qty 1

## 2019-03-08 MED ORDER — MIDAZOLAM HCL 2 MG/2ML IJ SOLN
INTRAMUSCULAR | Status: AC
  Filled 2019-03-08: qty 2

## 2019-03-08 MED ORDER — PROPOFOL 10 MG/ML IV BOLUS
INTRAVENOUS | Status: DC | PRN
  Administered 2019-03-08: 10 mg via INTRAVENOUS
  Administered 2019-03-08: 130 mg via INTRAVENOUS

## 2019-03-08 MED ORDER — FENTANYL CITRATE (PF) 100 MCG/2ML IJ SOLN
INTRAMUSCULAR | Status: DC | PRN
  Administered 2019-03-08 (×4): 50 ug via INTRAVENOUS

## 2019-03-08 MED ORDER — SODIUM CHLORIDE 0.9 % IV SOLN
INTRAVENOUS | Status: DC
  Administered 2019-03-08: 18:00:00 via INTRAVENOUS

## 2019-03-08 MED ORDER — TECHNETIUM TC 99M SULFUR COLLOID FILTERED
1.0000 | Freq: Once | INTRAVENOUS | Status: AC | PRN
  Administered 2019-03-08: 13:00:00 1 via INTRADERMAL

## 2019-03-08 SURGICAL SUPPLY — 62 items
APPLIER CLIP 9.375 MED OPEN (MISCELLANEOUS) ×3
BINDER BREAST LRG (GAUZE/BANDAGES/DRESSINGS) IMPLANT
BINDER BREAST XLRG (GAUZE/BANDAGES/DRESSINGS) ×6 IMPLANT
BIOPATCH RED 1 DISK 7.0 (GAUZE/BANDAGES/DRESSINGS) ×4 IMPLANT
BIOPATCH RED 1IN DISK 7.0MM (GAUZE/BANDAGES/DRESSINGS) ×2
CANISTER SUCT 3000ML PPV (MISCELLANEOUS) ×3 IMPLANT
CHLORAPREP W/TINT 26 (MISCELLANEOUS) ×3 IMPLANT
CLIP APPLIE 9.375 MED OPEN (MISCELLANEOUS) ×1 IMPLANT
CLOSURE WOUND 1/2 X4 (GAUZE/BANDAGES/DRESSINGS) ×1
CONT SPEC 4OZ CLIKSEAL STRL BL (MISCELLANEOUS) ×6 IMPLANT
COVER PROBE W GEL 5X96 (DRAPES) ×3 IMPLANT
COVER SURGICAL LIGHT HANDLE (MISCELLANEOUS) ×3 IMPLANT
COVER WAND RF STERILE (DRAPES) ×3 IMPLANT
DERMABOND ADVANCED (GAUZE/BANDAGES/DRESSINGS) ×6
DERMABOND ADVANCED .7 DNX12 (GAUZE/BANDAGES/DRESSINGS) ×3 IMPLANT
DRAIN CHANNEL 19F RND (DRAIN) ×3 IMPLANT
DRAPE LAPAROSCOPIC ABDOMINAL (DRAPES) ×3 IMPLANT
DRSG PAD ABDOMINAL 8X10 ST (GAUZE/BANDAGES/DRESSINGS) ×6 IMPLANT
DRSG TEGADERM 2-3/8X2-3/4 SM (GAUZE/BANDAGES/DRESSINGS) ×6 IMPLANT
ELECT BLADE 4.0 EZ CLEAN MEGAD (MISCELLANEOUS) ×3
ELECT CAUTERY BLADE 6.4 (BLADE) ×3 IMPLANT
ELECT REM PT RETURN 9FT ADLT (ELECTROSURGICAL) ×3
ELECTRODE BLDE 4.0 EZ CLN MEGD (MISCELLANEOUS) ×1 IMPLANT
ELECTRODE REM PT RTRN 9FT ADLT (ELECTROSURGICAL) ×1 IMPLANT
EVACUATOR SILICONE 100CC (DRAIN) ×6 IMPLANT
GAUZE SPONGE 4X4 12PLY STRL (GAUZE/BANDAGES/DRESSINGS) ×6 IMPLANT
GLOVE BIO SURGEON STRL SZ7 (GLOVE) ×6 IMPLANT
GLOVE BIOGEL PI IND STRL 7.5 (GLOVE) ×1 IMPLANT
GLOVE BIOGEL PI INDICATOR 7.5 (GLOVE) ×2
GLOVE SURG SS PI 6.0 STRL IVOR (GLOVE) ×3 IMPLANT
GOWN STRL REUS W/ TWL LRG LVL3 (GOWN DISPOSABLE) ×3 IMPLANT
GOWN STRL REUS W/TWL LRG LVL3 (GOWN DISPOSABLE) ×6
HEMOSTAT ARISTA ABSORB 3G PWDR (HEMOSTASIS) ×9 IMPLANT
KIT BASIN OR (CUSTOM PROCEDURE TRAY) ×3 IMPLANT
KIT TURNOVER KIT B (KITS) ×3 IMPLANT
MARKER SKIN DUAL TIP RULER LAB (MISCELLANEOUS) ×3 IMPLANT
NEEDLE 18GX1X1/2 (RX/OR ONLY) (NEEDLE) IMPLANT
NEEDLE FILTER BLUNT 18X 1/2SAF (NEEDLE)
NEEDLE FILTER BLUNT 18X1 1/2 (NEEDLE) IMPLANT
NEEDLE HYPO 25GX1X1/2 BEV (NEEDLE) IMPLANT
NS IRRIG 1000ML POUR BTL (IV SOLUTION) ×3 IMPLANT
PACK GENERAL/GYN (CUSTOM PROCEDURE TRAY) ×3 IMPLANT
PAD ARMBOARD 7.5X6 YLW CONV (MISCELLANEOUS) ×3 IMPLANT
PENCIL SMOKE EVACUATOR (MISCELLANEOUS) ×3 IMPLANT
PIN SAFETY STERILE (MISCELLANEOUS) ×3 IMPLANT
SPECIMEN JAR X LARGE (MISCELLANEOUS) ×3 IMPLANT
SPONGE LAP 18X18 RF (DISPOSABLE) ×3 IMPLANT
STAPLER VISISTAT 35W (STAPLE) ×3 IMPLANT
STRIP CLOSURE SKIN 1/2X4 (GAUZE/BANDAGES/DRESSINGS) ×2 IMPLANT
SUT ETHILON 2 0 FS 18 (SUTURE) ×3 IMPLANT
SUT ETHILON 3 0 FSL (SUTURE) ×3 IMPLANT
SUT MNCRL AB 4-0 PS2 18 (SUTURE) ×6 IMPLANT
SUT MON AB 4-0 PC3 18 (SUTURE) ×3 IMPLANT
SUT SILK 2 0 SH (SUTURE) IMPLANT
SUT VIC AB 2-0 SH 18 (SUTURE) ×9 IMPLANT
SUT VIC AB 3-0 54X BRD REEL (SUTURE) ×1 IMPLANT
SUT VIC AB 3-0 BRD 54 (SUTURE) ×2
SUT VIC AB 3-0 SH 18 (SUTURE) ×6 IMPLANT
SUT VIC AB 3-0 SH 8-18 (SUTURE) ×9 IMPLANT
SYR CONTROL 10ML LL (SYRINGE) IMPLANT
TOWEL GREEN STERILE (TOWEL DISPOSABLE) ×3 IMPLANT
TOWEL GREEN STERILE FF (TOWEL DISPOSABLE) ×3 IMPLANT

## 2019-03-08 NOTE — Anesthesia Procedure Notes (Addendum)
Procedure Name: Intubation Date/Time: 03/08/2019 1:00 PM Performed by: Orlie Dakin, CRNA Pre-anesthesia Checklist: Patient identified, Emergency Drugs available, Suction available and Patient being monitored Patient Re-evaluated:Patient Re-evaluated prior to induction Oxygen Delivery Method: Circle system utilized Preoxygenation: Pre-oxygenation with 100% oxygen Induction Type: Rapid sequence Laryngoscope Size: Mac and 3 Grade View: Grade I Tube type: Oral Tube size: 7.0 mm Number of attempts: 1 Airway Equipment and Method: Stylet Placement Confirmation: ETT inserted through vocal cords under direct vision and breath sounds checked- equal and bilateral Secured at: 22 cm Tube secured with: Tape Dental Injury: Teeth and Oropharynx as per pre-operative assessment  Comments: RSI due to Covid-19 pandemic concerns. DL and intubation per Charolette Forward, CRNA

## 2019-03-08 NOTE — Interval H&P Note (Signed)
History and Physical Interval Note:  03/08/2019 12:05 PM  Katie [name]  has presented today for surgery, with the diagnosis of BREAST CANCER.  The various methods of treatment have been discussed with the patient and family. After consideration of risks, benefits and other options for treatment, the patient has consented to  Procedure(s): BILATERAL MASTECTOMIES WITH LEFT AXILLARY SENTINEL LYMPH NODE BIOPSY AND BLUE DYE INJECTION (Bilateral) as a surgical intervention.  The patient's history has been reviewed, patient examined, no change in status, stable for surgery.  I have reviewed the patient's chart and labs.  Questions were answered to the patient's satisfaction.     Rolm Bookbinder

## 2019-03-08 NOTE — Transfer of Care (Signed)
Immediate Anesthesia Transfer of Care Note  Patient: Katie [name]  Procedure(s) Performed: BILATERAL MASTECTOMIES WITH LEFT AXILLARY SENTINEL LYMPH NODE BIOPSY AND BLUE DYE INJECTION (Bilateral Breast) Removal of Bilateral Breast Implants (Bilateral Breast)  Patient Location: PACU  Anesthesia Type:General and Regional  Level of Consciousness: awake and patient cooperative  Airway & Oxygen Therapy: Patient Spontanous Breathing and Patient connected to nasal cannula oxygen  Post-op Assessment: Report given to RN and Post -op Vital signs reviewed and stable  Post vital signs: Reviewed and stable  Last Vitals:  Vitals Value Taken Time  BP 146/65 03/08/19 1624  Temp    Pulse 90 03/08/19 1625  Resp 27 03/08/19 1625  SpO2 100 % 03/08/19 1625  Vitals shown include unvalidated device data.  Last Pain:  Vitals:   03/08/19 1150  TempSrc: Oral  PainSc: 0-No pain      Patients Stated Pain Goal: 5 (78/29/56 2130)  Complications: No apparent anesthesia complications

## 2019-03-08 NOTE — Anesthesia Postprocedure Evaluation (Signed)
Anesthesia Post Note  Patient: Katie [name]  Procedure(s) Performed: BILATERAL MASTECTOMIES WITH LEFT AXILLARY SENTINEL LYMPH NODE BIOPSY AND BLUE DYE INJECTION (Bilateral Breast) Removal of Bilateral Breast Implants (Bilateral Breast)     Patient location during evaluation: PACU Anesthesia Type: Regional and General Level of consciousness: awake and alert Pain management: pain level controlled Vital Signs Assessment: post-procedure vital signs reviewed and stable Respiratory status: spontaneous breathing, nonlabored ventilation and respiratory function stable Cardiovascular status: blood pressure returned to baseline and stable Postop Assessment: no apparent nausea or vomiting Anesthetic complications: no    Last Vitals:  Vitals:   03/08/19 1640 03/08/19 1645  BP: 137/60   Pulse: 87 78  Resp: 19 13  Temp:  36.5 C  SpO2: 97% 96%    Last Pain:  Vitals:   03/08/19 1645  TempSrc:   PainSc: Asleep                 Elanna Bert,W. EDMOND

## 2019-03-08 NOTE — Anesthesia Procedure Notes (Signed)
Anesthesia Regional Block: Pectoralis block   Pre-Anesthetic Checklist: ,, timeout performed, Correct Patient, Correct Site, Correct Laterality, Correct Procedure, Correct Position, site marked, Risks and benefits discussed, pre-op evaluation,  At surgeon's request and post-op pain management  Laterality: Left and Right  Prep: Maximum Sterile Barrier Precautions used, chloraprep       Needles:  Injection technique: Single-shot  Needle Type: Echogenic Stimulator Needle     Needle Length: 9cm  Needle Gauge: 21     Additional Needles:   Procedures:,,,, ultrasound used (permanent image in chart),,,,  Narrative:  Start time: 03/08/2019 12:31 PM End time: 03/08/2019 12:41 PM Injection made incrementally with aspirations every 5 mL. Anesthesiologist: Roderic Palau, MD  Additional Notes: 2% Lidocaine skin wheel. Bilateral PECS block

## 2019-03-08 NOTE — Discharge Instructions (Signed)
[facility] °[phone] ° °MASTECTOMY: POST OP INSTRUCTIONS °Take 400 mg of ibuprofen every 8 hours or 650 mg tylenol every 6 hours for next 72 hours then as needed. Use ice several times daily also. °Always review your discharge instruction sheet given to you by the facility where your surgery was performed. ° °IF YOU HAVE DISABILITY OR FAMILY LEAVE FORMS, YOU MUST BRING THEM TO THE OFFICE FOR PROCESSING.   °DO NOT GIVE THEM TO YOUR DOCTOR. °A prescription for pain medication may be given to you upon discharge.  Take your pain medication as prescribed, if needed.  If narcotic pain medicine is not needed, then you may take acetaminophen (Tylenol), naprosyn (Alleve) or ibuprofen (Advil) as needed. °1. Take your usually prescribed medications unless otherwise directed. °2. If you need a refill on your pain medication, please contact your pharmacy.  They will contact our office to request authorization.  Prescriptions will not be filled after 5pm or on week-ends. °3. You should follow a light diet the first few days after arrival home, such as soup and crackers, etc.  Resume your normal diet the day after surgery. °4. Most patients will experience some swelling and bruising on the chest and underarm.  Ice packs will help.  Swelling and bruising can take several days to resolve. Wear the binder day and night until you return to the office.  °5. It is common to experience some constipation if taking pain medication after surgery.  Increasing fluid intake and taking a stool softener (such as Colace) will usually help or prevent this problem from occurring.  A mild laxative (Milk of Magnesia or Miralax) should be taken according to package instructions if there are no bowel movements after 48 hours. °6. Unless discharge instructions indicate otherwise, leave your bandage dry and in place until your next appointment in 3-5 days.  You may take a limited sponge bath.  No tube baths or showers until the  drains are removed.  You may have steri-strips (small skin tapes) in place directly over the incision.  These strips should be left on the skin for 7-10 days. If you have glue it will come off in next couple week.  Any sutures will be removed at an office visit °7. DRAINS:  If you have drains in place, it is important to keep a list of the amount of drainage produced each day in your drains.  Before leaving the hospital, you should be instructed on drain care.  Call our office if you have any questions about your drains. I will remove your drains when they put out less than 30 cc or ml for 2 consecutive days. °8. ACTIVITIES:  You may resume regular (light) daily activities beginning the next day--such as daily self-care, walking, climbing stairs--gradually increasing activities as tolerated.  You may have sexual intercourse when it is comfortable.  Refrain from any heavy lifting or straining until approved by your doctor. °a. You may drive when you are no longer taking prescription pain medication, you can comfortably wear a seatbelt, and you can safely maneuver your car and apply brakes. °b. RETURN TO WORK:  __________________________________________________________ °9. You should see your doctor in the office for a follow-up appointment approximately 3-5 days after your surgery.  Your doctor’s nurse will typically make your follow-up appointment when she calls you with your pathology report.  Expect your pathology report 3-4business days after surgery. °10. OTHER INSTRUCTIONS: ______________________________________________________________________________________________ ____________________________________________________________________________________________ °WHEN TO CALL YOUR DR Anshika Pethtel: °1. Fever over 101.0 °  2. Nausea and/or vomiting °3. Extreme swelling or bruising °4. Continued bleeding from incision. °5. Increased pain, redness, or drainage from the incision. °The clinic staff is available to answer your  questions during regular business hours.  Please don’t hesitate to call and ask to speak to one of the nurses for clinical concerns.  If you have a medical emergency, go to the nearest emergency room or call 911.  A surgeon from [facility] is always on call at the hospital. °[address], [address], [place], [place]  [address] ? [address] [address], [place],    [address] °[phone] ? [phone] ? FAX [phone] °Web site: [email or web address] ° °

## 2019-03-08 NOTE — H&P (Signed)
73 year old female seen by Dr [name] in January and has now undergone primary chemotherapy which she has finished. She is a postmenopausal female the patient has a somewhat complex previous history of breast surgery. She has a family history of her sister diagnosed at age 37 with breast cancer from which she subsequently passed away. The patient had apparent significant fibrocystic breast disease with lumps and discomfort and in 1977 underwent bilateral reduction mammoplasty with subcutaneous mastectomies and placement of silicone implants retropectoral at [facility]. She subsequently has had a number of implant ruptures and exchanges bilaterally with subsequent saline implants placed most recently by Dr. Stephanie Coup in about 2005. She has had yearly mammograms. She had noted some redness to her lower left breast.Mammogram showed an apparent new asymmetry in the left breast. Subsequent ultrasound showed a dominant 1.5 cm mass but multiple other small masses throughout the breast initially felt to be possibly related to her implant ruptures. Bilateral breast MRI was performed showing multiple masses and enhancement extensively throughout the left breast measuring at least 7 cm. Also skin thickening noted inferior breast. No apparent adenopathy on MRI or ultrasound. 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 these are grade 2-3, er/pr pos, one is her 2 pos and Ki is 20-40. 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 she has undergone chemotherapy with improvement. on initial mri she also had a right retroareolar mass that was biopsied and benign. repeat mri at end of chemotherapy shows significant interval improvement. nodes normal. the 42mm mass on right unchanged. however she has developed spontaneous bloody nipple dc on the right side.   Past Surgical History  Breast Biopsy  Left. Colon Polyp Removal - Colonoscopy   Gallbladder Surgery - Laparoscopic  Knee Surgery  Left.  Diagnostic Studies History  Colonoscopy  1-5 years ago Mammogram  within last year Pap Smear  1-5 years ago  Allergies  Erythromycin *MACROLIDES*  Anaphylaxis, Hives, Rash. Cipro *FLUOROQUINOLONES*  Rash. Daypro *ANALGESICS - ANTI-INFLAMMATORY*  Enalapril Maleate *ANTIHYPERTENSIVES*  Nausea. NSAIDs  Nausea. Vioxx *ANALGESICS - ANTI-INFLAMMATORY*  Allergies Reconciled   Medication History Lipitor (10MG  Tablet, Oral) Active. Azelastine & Fluticasone (137 & 50MCG/ACT Therapy Pack, Nasal) Active. Viactiv ([id number] Tablet Chewable, Oral) Active. Cholecalciferol (400UNIT Capsule, Oral) Active. Clobetasol Propionate (0.05% Liquid, External) Active. Flonase (50MCG/DOSE Inhaler, Nasal) Active. HydroDiuril (25MG  Tablet, Oral) Active. Cozaar (100MG  Tablet, Oral) Active. metFORMIN HCl (500MG  Tablet, Oral) Active. Ondansetron HCl (8MG  Tablet, Oral) Active. LORazepam (0.5MG  Tablet, Oral) Active. amLODIPine Besylate (5MG  Tablet, Oral) Active. Fluticasone Propionate (50MCG/ACT Suspension, Nasal) Active. Medications Reconciled  Social History Alcohol use  Occasional alcohol use. Caffeine use  Carbonated beverages, Coffee, Tea. No drug use  Tobacco use  Never smoker.  Family History Arthritis  Mother. Bleeding disorder  Sister. Breast Cancer  Sister. Cerebrovascular Accident  Brother. Depression  Brother. Diabetes Mellitus  Father. Heart Disease  Father. Hypertension  Brother, Mother.   Physical Exam  General Mental Status-Alert. Head and Neck Trachea-midline. Eye Sclera/Conjunctiva - Bilateral-No scleral icterus. Chest and Lung Exam Chest and lung exam reveals -quiet, even and easy respiratory effort with no use of accessory muscles. Breast Nipples-No Discharge. Breast Lump-No Palpable Breast Mass. Note: bilateral implants in place, bilateral incision  healed Cardiovascular Cardiovascular examination reveals -normal heart sounds, regular rate and rhythm with no murmurs. Abdomen Note: soft no hm Neurologic Neurologic evaluation reveals -alert and oriented x 3 with no impairment of recent  or remote memory. Lymphatic Head & Neck General Head & Neck Lymphatics: Bilateral - Description - Normal. Axillary General Axillary Region: Bilateral - Description - Normal. Note: no [place] adenopathy  Assessment & Plan   MASS OVERLAPPING MULTIPLE QUADRANTS OF LEFT BREAST (N63.25) Story: left mastectomy, left ax sn biopsy, right mastectomy we discussed why Dr [name] not here right now and that I would plan to do her surgery in order not to delay. Dr [name] had recommended left mastectomy and sn biopsy from beginning and I agree with this. we discussed mastectomy, implant removal. she is at higher risk of wound issues and understands this. does not want any recon.  discussed appearance and risks of mastectomy. discussed rationale for sn biopsy and risk of shoulder dysfunction and lymphedema postop she desires mastectomy and implant removal on the right as well. I think at minimum she needs central duct excision but she really doesnt have any breast tissue due to prior surgery. I think due to family history, bloody dc and really a neg mri for most part that this is reasonable for her.   FAMILY HISTORY OF BREAST CANCER (Z80.3)   BLOODY DISCHARGE FROM RIGHT NIPPLE [phone])

## 2019-03-08 NOTE — Op Note (Signed)
Preoperative diagnosis:left breast cancer s/p primary chemotherapy, right bloody nipple discharged Postoperative diagnosis: same as above Procedure: 1. Right risk reducing mastectomy 2. Left deep axillary sentinel node biopsy 3. Injection blue dye for sentinel node identification 4. Left mastectomy 5. Removal bilateral implants.  Surgeon: Dr Serita Grammes Asst Dr Ewell Poe EBL:50cc [id number] Fr Keenan Bachelor drain on either side Specimens: 1. Right breast marked short superior, long lateral 2. Leftdeepaxillary sentinel nodes 3. 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  She underwent primary chemotherapy with significant interval improvement. We discussed options. At beginning and now it appears due to extent of disease mastectomy would be best.  We also discussed mastectomy on right as she does have bloody nipple discharge and really doesn't have much breast tissue due to her prior surgeries.  She was cancelled once due to thrombocytopenia from chemotherapy.   Procedure: After informed consent was obtained the patient first underwent bilateral pectoral block and was injected with technetium in the standard periareolar fashion on the left. She was given antibiotics. SCDs were in place. She was then placed under general anesthesia without complication. She was prepped and draped in the standard sterile surgical fashion. A surgical timeout was then performed.  I first injected 5 cc of a methylene blue saline mixture in the left periareolar position and massaged this.  I made an elliptical incision on the right side including the nac. I created flaps to the clavicle, parasternal region, IM fold and latissimus laterally.  I then removedthe breast and the pectoralis fashion from the  pectoralis muscle. The implant which was submuscular was then removed also. I then obtained hemostasis and placed a 19 Fr Blake drain. This was secured with a 2-0 nylon. I tried to remove the axillary fat pad as well.  I then pulled the lateral tissue to the chest wall with 2-0 vicryl suture.  I closed it with 3-0 vicryl and 4-0 monocryl. Glue and steristrips were applied.  I then went to the other side.  I made an elliptical incision on the left side including the nac. I made the inferior flap at the inframammary fold. The implant was removed early on this side.  I created flaps to the clavicle, parasternal region, IM fold and latissimus laterally.  I then removedthe breast and the pectoralis fashion from the pectoralis muscle. She had a lot of excess stretched out skin at completion of this despite removing a lot of the skin.  I then was in the axilla. I identified a couple radioactive nodes.I removedthese through the same incision and sent them off separately. There were no palpable nodes. There was no background radioactivity.I then obtained hemostasis and placed a 19 Fr Blake drain. This was secured with a 2-0 nylon. I then pulled the lateral tissue to the chest wall with 2-0 vicryl suture. She had excess tissue laterally. I had marked previously. I ended up advancing this in Y fashion and closed it like that.   I closed it with 3-0 vicryl and 4-0 monocryl. Glue and steristrips were applied.  She tolerated well and was transferred to recovery stable.

## 2019-03-09 ENCOUNTER — Encounter (HOSPITAL_COMMUNITY): Payer: Self-pay | Admitting: General Surgery

## 2019-03-09 DIAGNOSIS — C50812 Malignant neoplasm of overlapping sites of left female breast: Secondary | ICD-10-CM | POA: Diagnosis not present

## 2019-03-09 DIAGNOSIS — Z9882 Breast implant status: Secondary | ICD-10-CM | POA: Diagnosis not present

## 2019-03-09 DIAGNOSIS — Z9049 Acquired absence of other specified parts of digestive tract: Secondary | ICD-10-CM | POA: Diagnosis not present

## 2019-03-09 DIAGNOSIS — Z170 Estrogen receptor positive status [ER+]: Secondary | ICD-10-CM | POA: Diagnosis not present

## 2019-03-09 DIAGNOSIS — D241 Benign neoplasm of right breast: Secondary | ICD-10-CM | POA: Diagnosis not present

## 2019-03-09 DIAGNOSIS — Z9221 Personal history of antineoplastic chemotherapy: Secondary | ICD-10-CM | POA: Diagnosis not present

## 2019-03-09 LAB — BASIC METABOLIC PANEL
Anion gap: 8 (ref 5–15)
BUN: 18 mg/dL (ref 8–23)
CO2: 22 mmol/L (ref 22–32)
Calcium: 8.8 mg/dL — ABNORMAL LOW (ref 8.9–10.3)
Chloride: 106 mmol/L (ref 98–111)
Creatinine, Ser: 0.88 mg/dL (ref 0.44–1.00)
GFR calc Af Amer: >60 mL/min (ref >60)
GFR calc non Af Amer: >60 mL/min (ref >60)
Glucose, Bld: 147 mg/dL — ABNORMAL HIGH (ref 70–99)
Potassium: 4.6 mmol/L (ref 3.5–5.1)
Sodium: 136 mmol/L (ref 135–145)

## 2019-03-09 LAB — CBC
HCT: 24.6 % — ABNORMAL LOW (ref 36.0–46.0)
Hemoglobin: 8.5 g/dL — ABNORMAL LOW (ref 12.0–15.0)
MCH: 38.1 pg — ABNORMAL HIGH (ref 26.0–34.0)
MCHC: 34.6 g/dL (ref 30.0–36.0)
MCV: 110.3 fL — ABNORMAL HIGH (ref 80.0–100.0)
Platelets: 170 10*3/uL (ref 150–400)
RBC: 2.23 MIL/uL — ABNORMAL LOW (ref 3.87–5.11)
RDW: 18.9 % — ABNORMAL HIGH (ref 11.5–15.5)
WBC: 4.7 10*3/uL (ref 4.0–10.5)
nRBC: 0 % (ref 0.0–0.2)

## 2019-03-09 LAB — GLUCOSE, CAPILLARY: Glucose-Capillary: 143 mg/dL — ABNORMAL HIGH (ref 70–99)

## 2019-03-09 MED ORDER — OXYCODONE HCL 5 MG PO TABS: 5.0000 mg | ORAL_TABLET | Freq: Four times a day (QID) | ORAL | 0 refills | Status: DC | PRN

## 2019-03-09 NOTE — Discharge Summary (Signed)
Physician Discharge Summary  Patient ID: Katie [name] MRN: [id number] DOB/AGE: October 29, 1945 73 y.o.  Admit date: 03/08/2019 Discharge date: 03/09/2019  Admission Diagnoses: Breast cancer s/p primary chemotherapy Hypertension Hypercholesterolemia DM type 2  Discharge Diagnoses:  Active Problems:   Breast cancer, left breast [facility])   Discharged Condition: good  Hospital Course: 53 yof s/p right rrm, left mastectomy, left ax sn biopsy, removal bilateral implants. Doing well. Will be discharged home  Consults: None  Significant Diagnostic Studies: none  Treatments: surgery: as above    Disposition:    Allergies as of 03/09/2019      Reactions   Erythromycin Anaphylaxis   REACTION: Rash, hives   Ciprofloxacin Rash   Daypro [oxaprozin] Rash   Enalapril Maleate Nausea Only   Nsaids Nausea Only   Vioxx [rofecoxib] Nausea Only      Medication List    TAKE these medications   amLODipine 5 MG tablet Commonly known as: NORVASC Take 1 tablet (5 mg total) by mouth daily.   atorvastatin 10 MG tablet Commonly known as: LIPITOR TAKE 1 TABLET(10 MG) BY MOUTH DAILY What changed: See the new instructions.   Azelastine HCl 137 MCG/SPRAY Soln instill 1 spray into each nostril twice a day for 1 week then if needed What changed: See the new instructions.   cholecalciferol 10 MCG (400 UNIT) Tabs tablet Commonly known as: VITAMIN D3 Take 400 Units by mouth 2 (two) times daily.   clobetasol 0.05 % external solution Commonly known as: TEMOVATE Apply 1 application topically 2 (two) times daily as needed (scalp). Reported on 08/16/2015   fluticasone 50 MCG/ACT nasal spray Commonly known as: FLONASE INSTILL 2 SPRAYS INTO EACH NOSTRIL ONCE DAILY What changed: See the new instructions.   glucose blood test strip USE TO CHECK BLOOD SUGAR DAILY   loratadine 10 MG tablet Commonly known as: CLARITIN Take 10 mg by mouth daily.   metFORMIN 500 MG 24 hr tablet Commonly known as:  GLUCOPHAGE-XR Take 1 tablet (500 mg total) by mouth daily with breakfast. What changed: when to take this   metroNIDAZOLE 0.75 % cream Commonly known as: METROCREAM Apply 1 application topically 2 (two) times daily as needed (rosacea).   multivitamin with minerals Tabs tablet Take 1 tablet by mouth daily.   oxyCODONE 5 MG immediate release tablet Commonly known as: Oxy IR/ROXICODONE Take 1 tablet (5 mg total) by mouth every 6 (six) hours as needed for moderate pain.   triamcinolone cream 0.1 % Commonly known as: KENALOG Apply 1 application topically 2 (two) times daily. What changed:   when to take this  reasons to take this   valACYclovir 500 MG tablet Commonly known as: Valtrex Take 1 tablet (500 mg total) by mouth 2 (two) times daily. What changed:   when to take this  reasons to take this      Follow-up Information    Rolm Bookbinder, MD In 1 week.   Specialty: General Surgery Contact information: [address] [place] [address] [phone]           Signed: Rolm Bookbinder 03/09/2019, 3:13 PM

## 2019-03-09 NOTE — Plan of Care (Signed)
  Problem: Education: Goal: Required Educational Video(s) Outcome: Adequate for Discharge   Problem: Clinical Measurements: Goal: Ability to maintain clinical measurements within normal limits will improve Outcome: Adequate for Discharge Goal: Postoperative complications will be avoided or minimized Outcome: Adequate for Discharge   Problem: Skin Integrity: Goal: Demonstration of wound healing without infection will improve Outcome: Adequate for Discharge   Problem: Education: Goal: Knowledge of General Education information will improve Description: Including pain rating scale, medication(s)/side effects and non-pharmacologic comfort measures Outcome: Adequate for Discharge   Problem: Health Behavior/Discharge Planning: Goal: Ability to manage health-related needs will improve Outcome: Adequate for Discharge   Problem: Clinical Measurements: Goal: Ability to maintain clinical measurements within normal limits will improve Outcome: Adequate for Discharge Goal: Will remain free from infection Outcome: Adequate for Discharge Goal: Diagnostic test results will improve Outcome: Adequate for Discharge Goal: Respiratory complications will improve Outcome: Adequate for Discharge Goal: Cardiovascular complication will be avoided Outcome: Adequate for Discharge   Problem: Activity: Goal: Risk for activity intolerance will decrease Outcome: Adequate for Discharge   Problem: Nutrition: Goal: Adequate nutrition will be maintained Outcome: Adequate for Discharge   Problem: Coping: Goal: Level of anxiety will decrease Outcome: Adequate for Discharge   Problem: Elimination: Goal: Will not experience complications related to bowel motility Outcome: Adequate for Discharge Goal: Will not experience complications related to urinary retention Outcome: Adequate for Discharge   Problem: Pain Managment: Goal: General experience of comfort will improve Outcome: Adequate for Discharge    Problem: Safety: Goal: Ability to remain free from injury will improve Outcome: Adequate for Discharge   Problem: Skin Integrity: Goal: Risk for impaired skin integrity will decrease Outcome: Adequate for Discharge   

## 2019-03-09 NOTE — Progress Notes (Signed)
1 Day Post-Op   Subjective/Chief Complaint: Doing well, pain controlled, ambulating, urinating   Objective: Vital signs in last 24 hours: Temp:  [97.7 F (36.5 C)-98.5 F (36.9 C)] 98.1 F (36.7 C) (07/14 0518) Pulse Rate:  [63-91] 65 (07/14 0518) Resp:  [9-19] 16 (07/14 0518) BP: (120-146)/(27-65) 121/45 (07/14 0518) SpO2:  [93 %-100 %] 97 % (07/14 0518) Weight:  [69.9 kg] 69.9 kg (07/13 1150)    Intake/Output from previous day: 07/13 0701 - 07/14 0700 In: 2163.9 [P.O.:480; I.V.:1683.9] Out: 250 [Drains:250] Intake/Output this shift: No intake/output data recorded.  Incision/Wound: flaps viable, drains serosang, no hematoma  Lab Results:  Recent Labs    03/08/19 1833 03/09/19 0231  WBC 4.5 4.7  HGB 9.1* 8.5*  HCT 26.5* 24.6*  PLT 168 170   BMET Recent Labs    03/09/19 0231  NA 136  K 4.6  CL 106  CO2 22  GLUCOSE 147*  BUN 18  CREATININE 0.88  CALCIUM 8.8*   PT/INR No results for input(s): LABPROT, INR in the last 72 hours. ABG No results for input(s): PHART, HCO3 in the last 72 hours.  Invalid input(s): PCO2, PO2  Studies/Results: Nm Sentinel Node Inj-no Rpt (breast)  Result Date: 03/08/2019 Sulfur colloid was injected by the nuclear medicine technologist for melanoma sentinel node.    Anti-infectives: Anti-infectives (From admission, onward)   Start     Dose/Rate Route Frequency Ordered Stop   03/08/19 0830  ceFAZolin (ANCEF) IVPB 2g/100 mL premix     2 g 200 mL/hr over 30 Minutes Intravenous On call to O.R. 03/08/19 0828 03/08/19 1316      Assessment/Plan: POD 1 bilateral mastectomies/sn biopsy -hopefully home today -path pending  Rolm Bookbinder 03/09/2019

## 2019-03-09 NOTE — Progress Notes (Signed)
Katie [name] to be D/C'd  per MD order. Discussed with the patient and all questions fully answered.  VSS, Skin clean, dry and intact without evidence of skin break down, no evidence of skin tears noted.  IV catheter discontinued intact. Site without signs and symptoms of complications. Dressing and pressure applied.  An After Visit Summary was printed and given to the patient. Patient received prescription.  D/c education completed with patient/family including follow up instructions, medication list, d/c activities limitations if indicated, with other d/c instructions as indicated by MD - patient able to verbalize understanding, all questions fully answered.   Patient instructed to return to ED, call 911, or call MD for any changes in condition.   Patient to be escorted via [place], and D/C home via private auto.

## 2019-03-12 ENCOUNTER — Encounter: Payer: Self-pay | Admitting: *Deleted

## 2019-03-12 LAB — TYPE AND SCREEN
ABO/RH(D): A POS
Antibody Screen: POSITIVE
Donor AG Type: NEGATIVE
Donor AG Type: NEGATIVE
Unit division: 0
Unit division: 0

## 2019-03-12 LAB — BPAM RBC
Blood Product Expiration Date: 202008042359
Blood Product Expiration Date: 202008042359
Unit Type and Rh: 6200
Unit Type and Rh: 6200

## 2019-03-17 ENCOUNTER — Other Ambulatory Visit: Payer: Self-pay | Admitting: *Deleted

## 2019-03-19 ENCOUNTER — Other Ambulatory Visit: Payer: Self-pay | Admitting: Hematology and Oncology

## 2019-03-19 DIAGNOSIS — C50412 Malignant neoplasm of upper-outer quadrant of left female breast: Secondary | ICD-10-CM

## 2019-03-19 DIAGNOSIS — Z170 Estrogen receptor positive status [ER+]: Secondary | ICD-10-CM

## 2019-03-19 MED ORDER — LIDOCAINE-PRILOCAINE 2.5-2.5 % EX CREA: TOPICAL_CREAM | CUTANEOUS | 3 refills | Status: DC

## 2019-03-19 NOTE — Progress Notes (Signed)
DISCONTINUE ON PATHWAY REGIMEN - Breast     A cycle is every 21 days:     Pertuzumab      Pertuzumab      Trastuzumab-xxxx      Trastuzumab-xxxx      Carboplatin      Docetaxel   **Always confirm dose/schedule in your pharmacy ordering system**  REASON: Other Reason PRIOR TREATMENT: [id number]: Docetaxel + Carboplatin + Trastuzumab + Pertuzumab (TCHP) q21 Days x 6 Cycles TREATMENT RESPONSE: Unable to Evaluate  START ON PATHWAY REGIMEN - Breast     A cycle is every 21 days:     Ado-trastuzumab emtansine   **Always confirm dose/schedule in your pharmacy ordering system**  Patient Characteristics: Post-Neoadjuvant Therapy and Resection, HER2 Positive, ER Positive, Residual Disease, Adjuvant Targeted Therapy After Neoadjuvant Chemo/Targeted Therapy Therapeutic Status: Post-Neoadjuvant Therapy and Resection ER Status: Positive (+) HER2 Status: Positive (+) PR Status: Positive (+) Residual Invasive Disease Post-Neoadjuvant Therapy<= Yes Intent of Therapy: Curative Intent, Discussed with Patient 

## 2019-03-19 NOTE — Assessment & Plan Note (Signed)
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 Neoadjuvant chemotherapy with [place] Perjeta 6 cyclescompleted 5/6/2020followed by Herceptin/ Perjeta vs Kadcylamaintenance for 1 year 2. Followed by Bil mastectomies with sentinel lymph node study 03/08/2019 3. Followed by adjuvant radiation therapy 4.  Followed by adjuvant antiestrogen therapy ------------------------------------------------------------------------------------------------------------------------------------ 03/08/2019: Bilateral mastectomies: Left mastectomy: Grade 2 IDC spanning 7.5 cm, margins negative, right mastectomy: Benign, ER 95%, PR 5%, HER-2 1+ negative  Pathology counseling: I discussed the final pathology report of the patient provided  a copy of this report. I discussed the margins. We also discussed the final staging along with previously performed ER/PR and HER-2/neu testing.  Treatment plan: Kadcyla maintenance every 3 weeks

## 2019-03-23 NOTE — Progress Notes (Signed)
Patient Care Team: [name], MD as PCP - General (Family Medicine) Birder [name], MD as Referring Physician (Ophthalmology) [name], MD as Consulting Physician (Dermatology) [name], MD (Inactive) as Consulting Physician (Orthopedic Surgery) [name], MD as Consulting Physician (General Surgery) [name], MD as Consulting Physician (Dentistry) [name], MD as Consulting Physician (General Surgery) [name], MD as Consulting Physician (Hematology and Oncology) [name], MD as Attending Physician (Radiation Oncology)  DIAGNOSIS:    ICD-10-CM   1. Malignant neoplasm of upper-outer quadrant of left breast in female, estrogen receptor positive ([place])  C50.412    Z17.0     SUMMARY OF ONCOLOGIC HISTORY: Oncology History  Malignant neoplasm of upper-outer quadrant of left breast in female, estrogen receptor positive ([place])  09/14/2018 Initial Diagnosis   With prior history of breast implants that were ruptured/replaced, patient had cellulitis LIQ left breast for 1 week, and UOQ asymmetry 1.5 cm, MRI right breast biopsy benign, left breast numerous masses 7 cm skin thickening: 2 biopsies from left breast: Grade 2-3 IDC ER PR positive, HER-2 positive, Ki-67 50%   09/16/2018 Cancer Staging   Staging form: Breast, AJCC 8th Edition - Clinical stage from 09/16/2018: Stage IIIB (cT4, cN0, cM0, G3, ER+, PR+, HER2+) - Signed by [name], MD on 09/16/2018   09/29/2018 -  Neo-Adjuvant Chemotherapy   TCHP q 3 weeks   01/19/2019 Breast MRI   Significant interval improvement, the multiple left breast masses and areas of non mass enhancement in all 4 quadrants have decreased in size and extent. The mass previously measuring 1.4 cm with diffuse enhancement now measures 9 mm with heterogeneous areas of enhancement. Current greatest transverse dimension is 5.4 x 3.9 cm, previously 7 cm x 5cm. 2. 4 mm masslike focus of enhancement at the base of the  right nipple is unchanged.   02/10/2019 - 03/23/2019 Chemotherapy   The patient had trastuzumab (HERCEPTIN) 450 mg in sodium chloride 0.9 % 250 mL chemo infusion, 420 mg (100 % of original dose 6 mg/kg), Intravenous,  Once, 2 of 5 cycles Dose modification: 6 mg/kg (original dose 6 mg/kg, Cycle 1, Reason: Provider Judgment) Administration: 450 mg (02/10/2019), 450 mg (03/03/2019)  for chemotherapy treatment.    03/08/2019 Surgery   Bilateral mastectomies [name]: right breast: no malignancy; left breast: IDC, grade 2, scattered over 7.5cm, clear margins.    03/24/2019 -  Chemotherapy   The patient had ado-trastuzumab emtansine (KADCYLA) 260 mg in sodium chloride 0.9 % 250 mL chemo infusion, 3.6 mg/kg = 260 mg, Intravenous, Once, 0 of 10 cycles  for chemotherapy treatment.      CHIEF COMPLIANT: Follow-up s/p bilateral mastectomies to review pathology  INTERVAL HISTORY: Katie [name] is a 72 y.o. with above-mentioned history of HER-2 positive left breast cancer who completed neoadjuvant chemotherapy with TCHP. She underwent bilateral mastectomies on 03/08/19 for which pathology confirmed no malignancy in the right breast and grade 2 invasive ductal carcinoma scattered over 7.5cm in the left breast with clear margins. She presents to the clinic today to review the pathology report and discuss further treatment.   REVIEW OF SYSTEMS:   Constitutional: Denies fevers, chills or abnormal weight loss Eyes: Denies blurriness of vision Ears, nose, mouth, throat, and face: Denies mucositis or sore throat Respiratory: Denies cough, dyspnea or wheezes Cardiovascular: Denies palpitation, chest discomfort Gastrointestinal: Denies nausea, heartburn or change in bowel habits Skin: Denies abnormal skin rashes Lymphatics: Denies new lymphadenopathy or easy bruising Neurological: Denies numbness, tingling or  new weaknesses Behavioral/Psych: Mood is stable, no new changes  Extremities: No lower extremity  edema Breast: s/p bilateral mastectomies All other systems were reviewed with the patient and are negative.  I have reviewed the past medical history, past surgical history, social history and family history with the patient and they are unchanged from previous note.  ALLERGIES:  is allergic to erythromycin; ciprofloxacin; daypro [oxaprozin]; enalapril maleate; nsaids; and vioxx [rofecoxib].  MEDICATIONS:  Current Outpatient Medications  Medication Sig Dispense Refill  . amLODipine (NORVASC) 5 MG tablet Take 1 tablet (5 mg total) by mouth daily. 30 tablet 11  . atorvastatin (LIPITOR) 10 MG tablet TAKE 1 TABLET(10 MG) BY MOUTH DAILY (Patient taking differently: Take 10 mg by mouth every evening. ) 90 tablet 0  . Azelastine HCl 137 MCG/SPRAY SOLN instill 1 spray into each nostril twice a day for 1 week then if needed (Patient taking differently: Place 1 spray into both nostrils daily as needed (congestion). ) 30 mL 5  . cholecalciferol (VITAMIN D) 400 units TABS tablet Take 400 Units by mouth 2 (two) times daily.    . clobetasol (TEMOVATE) 0.05 % external solution Apply 1 application topically 2 (two) times daily as needed (scalp). Reported on 08/16/2015    . fluticasone (FLONASE) 50 MCG/ACT nasal spray INSTILL 2 SPRAYS INTO EACH NOSTRIL ONCE DAILY (Patient taking differently: Place 2 sprays into both nostrils daily. ) 16 g 5  . glucose blood test strip USE TO CHECK BLOOD SUGAR DAILY 50 each 12  . lidocaine-prilocaine (EMLA) cream Apply to affected area once 30 g 3  . loratadine (CLARITIN) 10 MG tablet Take 10 mg by mouth daily.    . metFORMIN (GLUCOPHAGE-XR) 500 MG 24 hr tablet Take 1 tablet (500 mg total) by mouth daily with breakfast. (Patient taking differently: Take 500 mg by mouth at bedtime. ) 90 tablet 3  . metroNIDAZOLE (METROCREAM) 0.75 % cream Apply 1 application topically 2 (two) times daily as needed (rosacea).     . Multiple Vitamin (MULTIVITAMIN WITH MINERALS) TABS tablet Take 1  tablet by mouth daily.    [name] oxyCODONE (OXY IR/ROXICODONE) 5 MG immediate release tablet Take 1 tablet (5 mg total) by mouth every 6 (six) hours as needed for moderate pain. 12 tablet 0  . triamcinolone cream (KENALOG) 0.1 % Apply 1 application topically 2 (two) times daily. (Patient taking differently: Apply 1 application topically 2 (two) times daily as needed (skin rashes). ) 15 g 0  . valACYclovir (VALTREX) 500 MG tablet Take 1 tablet (500 mg total) by mouth 2 (two) times daily. (Patient taking differently: Take 500 mg by mouth 2 (two) times daily as needed (flare up). ) 6 tablet 2   No current facility-administered medications for this visit.     PHYSICAL EXAMINATION: ECOG PERFORMANCE STATUS: 1 - Symptomatic but completely ambulatory  There were no vitals filed for this visit. There were no vitals filed for this visit.  GENERAL: alert, no distress and comfortable SKIN: skin color, texture, turgor are normal, no rashes or significant lesions EYES: normal, Conjunctiva are pink and non-injected, sclera clear OROPHARYNX: no exudate, no erythema and lips, buccal mucosa, and tongue normal  NECK: supple, thyroid normal size, non-tender, without nodularity LYMPH: no palpable lymphadenopathy in the cervical, axillary or inguinal LUNGS: clear to auscultation and percussion with normal breathing effort HEART: regular rate & rhythm and no murmurs and no lower extremity edema ABDOMEN: abdomen soft, non-tender and normal bowel sounds MUSCULOSKELETAL: no cyanosis of digits and  no clubbing  NEURO: alert & oriented x 3 with fluent speech, no focal motor/sensory deficits EXTREMITIES: No lower extremity edema  LABORATORY DATA:  I have reviewed the data as listed CMP Latest Ref Rng & Units 03/09/2019 02/25/2019 02/15/2019  Glucose 70 - 99 mg/dL 147(H) 99 115(H)  BUN 8 - 23 mg/dL _0 Creatinine 0.44 - 1.00 mg/dL 0.88 0.82 0.78  Sodium 135 - 145 mmol/L 136 140 140  Potassium 3.5 - 5.1 mmol/L 4.6  4.1 4.2  Chloride 98 - 111 mmol/L 106 106 107  CO2 22 - 32 mmol/L _1 Calcium 8.9 - 10.3 mg/dL 8.8(L) 9.5 9.5  Total Protein 6.5 - 8.1 g/dL - 7.2 -  Total Bilirubin 0.3 - 1.2 mg/dL - 0.5 -  Alkaline Phos 38 - 126 U/L - 80 -  AST 15 - 41 U/L - 19 -  ALT 0 - 44 U/L - 19 -    Lab Results  Component Value Date   WBC 4.7 03/09/2019   HGB 8.5 (L) 03/09/2019   HCT 24.6 (L) 03/09/2019   MCV 110.3 (H) 03/09/2019   PLT 170 03/09/2019   NEUTROABS 1.2 (L) 03/03/2019    ASSESSMENT & PLAN:  Malignant neoplasm of upper-outer quadrant of left breast in female, estrogen receptor positive ([place]) With prior history of breast implants that were ruptured/replaced, patient had cellulitis LIQ left breast for 1 week, and UOQ asymmetry 1.5 cm, MRI right breast biopsy benign, left breast numerous masses 7 cm skin thickening: 2 biopsies from left breast: Grade 2-3 IDC ER PR positive, HER-2 positive, Ki-67 50% Clinical stage: T4 N0 M0 stage IIIb  Treatment plan 1. Neoadjuvant chemotherapy with [place] Perjeta 6 cyclescompleted 5/6/2020followed by Herceptin/ Perjeta vs Kadcylamaintenance for 1 year 2. Followed by Bil mastectomies with sentinel lymph node study 03/08/2019 3. Followed by adjuvant radiation therapy 4.  Followed by adjuvant antiestrogen therapy ------------------------------------------------------------------------------------------------------------------------------------ 03/08/2019: Bilateral mastectomies: Left mastectomy: Grade 2 IDC spanning 7.5 cm, margins negative, right mastectomy: Benign, ER 95%, PR 5%, HER-2 1+ negative  Pathology counseling: I discussed the final pathology report of the patient provided  a copy of this report. I discussed the margins. We also discussed the final staging along with previously performed ER/PR and HER-2/neu testing. We discussed the fact that pathology lost the lymph node excision.  She is unhappy about this but there was nothing we can do about it at  this time.  Treatment plan: Kadcyla maintenance every 3 weeks  Radiation referral will be placed.  Antiestrogen therapy after radiation is complete Return to clinic every 3 weeks with labs and MD visit and follow-up for Kadcyla.  No orders of the defined types were placed in this encounter.  The patient has a good understanding of the overall plan. she agrees with it. she will call with any problems that may develop before the next visit here.  [name], MD 03/24/2019  Julious Oka Dorshimer am acting as scribe for Dr. [name].  I have reviewed the above documentation for accuracy and completeness, and I agree with the above.

## 2019-03-24 ENCOUNTER — Other Ambulatory Visit: Payer: Self-pay

## 2019-03-24 ENCOUNTER — Inpatient Hospital Stay (HOSPITAL_BASED_OUTPATIENT_CLINIC_OR_DEPARTMENT_OTHER): Payer: Medicare Other | Admitting: Hematology and Oncology

## 2019-03-24 ENCOUNTER — Inpatient Hospital Stay: Payer: Medicare Other

## 2019-03-24 VITALS — BP 140/55 | HR 62 | Temp 97.5°F | Resp 18

## 2019-03-24 DIAGNOSIS — Z886 Allergy status to analgesic agent status: Secondary | ICD-10-CM

## 2019-03-24 DIAGNOSIS — Z9013 Acquired absence of bilateral breasts and nipples: Secondary | ICD-10-CM

## 2019-03-24 DIAGNOSIS — Z79899 Other long term (current) drug therapy: Secondary | ICD-10-CM

## 2019-03-24 DIAGNOSIS — R234 Changes in skin texture: Secondary | ICD-10-CM

## 2019-03-24 DIAGNOSIS — Z9882 Breast implant status: Secondary | ICD-10-CM

## 2019-03-24 DIAGNOSIS — C50412 Malignant neoplasm of upper-outer quadrant of left female breast: Secondary | ICD-10-CM | POA: Diagnosis not present

## 2019-03-24 DIAGNOSIS — Z7984 Long term (current) use of oral hypoglycemic drugs: Secondary | ICD-10-CM

## 2019-03-24 DIAGNOSIS — Z170 Estrogen receptor positive status [ER+]: Secondary | ICD-10-CM

## 2019-03-24 DIAGNOSIS — Z5112 Encounter for antineoplastic immunotherapy: Secondary | ICD-10-CM

## 2019-03-24 DIAGNOSIS — Z881 Allergy status to other antibiotic agents status: Secondary | ICD-10-CM

## 2019-03-24 DIAGNOSIS — D696 Thrombocytopenia, unspecified: Secondary | ICD-10-CM | POA: Diagnosis not present

## 2019-03-24 DIAGNOSIS — E119 Type 2 diabetes mellitus without complications: Secondary | ICD-10-CM | POA: Diagnosis not present

## 2019-03-24 MED ORDER — ACETAMINOPHEN 325 MG PO TABS
ORAL_TABLET | ORAL | Status: AC
  Filled 2019-03-24: qty 2

## 2019-03-24 MED ORDER — HEPARIN SOD (PORK) LOCK FLUSH 100 UNIT/ML IV SOLN
500.0000 [IU] | Freq: Once | INTRAVENOUS | Status: AC | PRN
  Administered 2019-03-24: 18:00:00 500 [IU]
  Filled 2019-03-24: qty 5

## 2019-03-24 MED ORDER — DIPHENHYDRAMINE HCL 25 MG PO CAPS
ORAL_CAPSULE | ORAL | Status: AC
  Filled 2019-03-24: qty 2

## 2019-03-24 MED ORDER — SODIUM CHLORIDE 0.9% FLUSH
10.0000 mL | INTRAVENOUS | Status: DC | PRN
  Administered 2019-03-24: 10 mL
  Filled 2019-03-24: qty 10

## 2019-03-24 MED ORDER — ACETAMINOPHEN 325 MG PO TABS
650.0000 mg | ORAL_TABLET | Freq: Once | ORAL | Status: AC
  Administered 2019-03-24: 650 mg via ORAL

## 2019-03-24 MED ORDER — SODIUM CHLORIDE 0.9 % IV SOLN
Freq: Once | INTRAVENOUS | Status: AC
  Administered 2019-03-24: 14:00:00 via INTRAVENOUS
  Filled 2019-03-24: qty 250

## 2019-03-24 MED ORDER — DIPHENHYDRAMINE HCL 25 MG PO CAPS
50.0000 mg | ORAL_CAPSULE | Freq: Once | ORAL | Status: AC
  Administered 2019-03-24: 14:00:00 50 mg via ORAL

## 2019-03-24 MED ORDER — SODIUM CHLORIDE 0.9 % IV SOLN
3.6000 mg/kg | Freq: Once | INTRAVENOUS | Status: AC
  Administered 2019-03-24: 15:00:00 260 mg via INTRAVENOUS
  Filled 2019-03-24: qty 8

## 2019-03-24 NOTE — Patient Instructions (Signed)
[place] Discharge Instructions for Patients Receiving Chemotherapy  Today you received the following chemotherapy agents Kadcyla (Ado-Trastuzumab Emtansine)  To help prevent nausea and vomiting after your treatment, we encourage you to take your nausea medication as directed. If you develop nausea and vomiting that is not controlled by your nausea medication, call the clinic.   BELOW ARE SYMPTOMS THAT SHOULD BE REPORTED IMMEDIATELY:  *FEVER GREATER THAN 100.5 F  *CHILLS WITH OR WITHOUT FEVER  NAUSEA AND VOMITING THAT IS NOT CONTROLLED WITH YOUR NAUSEA MEDICATION  *UNUSUAL SHORTNESS OF BREATH  *UNUSUAL BRUISING OR BLEEDING  TENDERNESS IN MOUTH AND THROAT WITH OR WITHOUT PRESENCE OF ULCERS  *URINARY PROBLEMS  *BOWEL PROBLEMS  UNUSUAL RASH Items with * indicate a potential emergency and should be followed up as soon as possible.  Feel free to call the clinic should you have any questions or concerns. The clinic phone number is [phone].  Please show the [place] at check-in to the Emergency Department and triage nurse.  Ado-Trastuzumab Emtansine for injection(Kadcyla) What is this medicine? ADO-TRASTUZUMAB EMTANSINE (ADD oh traz TOO zuh mab em TAN zine) is a monoclonal antibody combined with chemotherapy. It is used to treat breast cancer. This medicine may be used for other purposes; ask your health care provider or pharmacist if you have questions. COMMON BRAND NAME(S): Kadcyla What should I tell my health care provider before I take this medicine? They need to know if you have any of these conditions:  heart disease  heart failure  infection (especially a virus infection such as chickenpox, cold sores, or herpes)  liver disease  lung or breathing disease, like asthma  tingling of the fingers or toes, or other nerve disorder  an unusual or allergic reaction to ado-trastuzumab emtansine, other medications, foods, dyes, or  preservatives  pregnant or trying to get pregnant  breast-feeding How should I use this medicine? This medicine is for infusion into a vein. It is given by a health care professional in a hospital or clinic setting. Talk to your pediatrician regarding the use of this medicine in children. Special care may be needed. Overdosage: If you think you have taken too much of this medicine contact a poison control center or emergency room at once. NOTE: This medicine is only for you. Do not share this medicine with others. What if I miss a dose? It is important not to miss your dose. Call your doctor or health care professional if you are unable to keep an appointment. What may interact with this medicine? This medicine may also interact with the following medications:  atazanavir  boceprevir  clarithromycin  delavirdine  indinavir  dalfopristin; quinupristin  isoniazid, INH  itraconazole  ketoconazole  nefazodone  nelfinavir  ritonavir  telaprevir  telithromycin  tipranavir  voriconazole This list may not describe all possible interactions. Give your health care provider a list of all the medicines, herbs, non-prescription drugs, or dietary supplements you use. Also tell them if you smoke, drink alcohol, or use illegal drugs. Some items may interact with your medicine. What should I watch for while using this medicine? Visit your doctor for checks on your progress. This drug may make you feel generally unwell. This is not uncommon, as chemotherapy can affect healthy cells as well as cancer cells. Report any side effects. Continue your course of treatment even though you feel ill unless your doctor tells you to stop. You may need blood work done while you are taking this medicine.  Call your doctor or health care professional for advice if you get a fever, chills or sore throat, or other symptoms of a cold or flu. Do not treat yourself. This drug decreases your body's ability  to fight infections. Try to avoid being around people who are sick. Be careful brushing and flossing your teeth or using a toothpick because you may get an infection or bleed more easily. If you have any dental work done, tell your dentist you are receiving this medicine. Avoid taking products that contain aspirin, acetaminophen, ibuprofen, naproxen, or ketoprofen unless instructed by your doctor. These medicines may hide a fever. Do not become pregnant while taking this medicine or for 7 months after stopping it, men with female partners should use contraception during treatment and for 4 months after the last dose. Women should inform their doctor if they wish to become pregnant or think they might be pregnant. There is a potential for serious side effects to an unborn child. Do not breast-feed an infant while taking this medicine or for 7 months after the last dose. Men who have a partner who is pregnant or who is capable of becoming pregnant should use a condom during sexual activity while taking this medicine and for 4 months after stopping it. Men should inform their doctors if they wish to father a child. This medicine may lower sperm counts. Talk to your health care professional or pharmacist for more information. What side effects may I notice from receiving this medicine? Side effects that you should report to your doctor or health care professional as soon as possible:  allergic reactions like skin rash, itching or hives, swelling of the face, lips, or tongue  breathing problems  chest pain or palpitations  fever or chills, sore throat  general ill feeling or flu-like symptoms  light-colored stools  nausea, vomiting  pain, tingling, numbness in the hands or feet  signs and symptoms of bleeding such as bloody or black, tarry stools; red or dark-brown urine; spitting up blood or brown material that looks like coffee grounds; red spots on the skin; unusual bruising or bleeding from  the eye, gums, or nose  swelling of the legs or ankles  yellowing of the eyes or skin Side effects that usually do not require medical attention (report to your doctor or health care professional if they continue or are bothersome):  changes in taste  constipation  dizziness  headache  joint pain  muscle pain  trouble sleeping  unusually weak or tired This list may not describe all possible side effects. Call your doctor for medical advice about side effects. You may report side effects to FDA at 1-800-FDA-1088. Where should I keep my medicine? This drug is given in a hospital or clinic and will not be stored at home. NOTE: This sheet is a summary. It may not cover all possible information. If you have questions about this medicine, talk to your doctor, pharmacist, or health care provider.  2020 Elsevier/Gold Standard (2018-01-09 10:03:15)

## 2019-03-24 NOTE — Progress Notes (Signed)
Per Dr. Lindi Adie OK to use lab results froom 03/09/2019.

## 2019-03-25 ENCOUNTER — Telehealth: Payer: Self-pay | Admitting: Hematology and Oncology

## 2019-03-25 NOTE — Telephone Encounter (Signed)
I talk with patient regarding schedule  

## 2019-03-29 ENCOUNTER — Telehealth: Payer: Self-pay

## 2019-03-29 ENCOUNTER — Encounter: Payer: Self-pay | Admitting: Physical Therapy

## 2019-03-29 ENCOUNTER — Ambulatory Visit: Payer: Medicare Other | Attending: Hematology and Oncology | Admitting: Physical Therapy

## 2019-03-29 ENCOUNTER — Other Ambulatory Visit: Payer: Self-pay

## 2019-03-29 DIAGNOSIS — Z483 Aftercare following surgery for neoplasm: Secondary | ICD-10-CM | POA: Insufficient documentation

## 2019-03-29 DIAGNOSIS — Z170 Estrogen receptor positive status [ER+]: Secondary | ICD-10-CM | POA: Insufficient documentation

## 2019-03-29 DIAGNOSIS — R600 Localized edema: Secondary | ICD-10-CM | POA: Insufficient documentation

## 2019-03-29 DIAGNOSIS — M25611 Stiffness of right shoulder, not elsewhere classified: Secondary | ICD-10-CM | POA: Diagnosis not present

## 2019-03-29 DIAGNOSIS — M25612 Stiffness of left shoulder, not elsewhere classified: Secondary | ICD-10-CM | POA: Insufficient documentation

## 2019-03-29 DIAGNOSIS — C50412 Malignant neoplasm of upper-outer quadrant of left female breast: Secondary | ICD-10-CM | POA: Diagnosis not present

## 2019-03-29 DIAGNOSIS — R293 Abnormal posture: Secondary | ICD-10-CM | POA: Insufficient documentation

## 2019-03-29 NOTE — Telephone Encounter (Signed)
RN returned call, left message for patient to call back.

## 2019-03-29 NOTE — Therapy (Signed)
[place], [place], [address] Phone: [phone]   Fax:  [phone]  Physical Therapy Evaluation  Patient Details  Name: Katie [name] MRN: [id number] Date of Birth: [birth year] Referring Provider (PT): Dr. [name]   Encounter Date: 03/29/2019  PT End of Session - 03/29/19 1453    Visit Number  1    Number of Visits  8    Date for PT Re-Evaluation  04/26/19    PT Start Time  8416    PT Stop Time  1456    PT Time Calculation (min)  51 min    Activity Tolerance  Patient tolerated treatment well    Behavior During Therapy  [facility] for tasks assessed/performed       Past Medical History:  Diagnosis Date  . Allergy   . Anemia   . 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 Blood transfusion without reported diagnosis   . Cancer ([place])    breast 08/2018  . Diverticulitis   . DJD (degenerative joint disease)   . DM type 2 (diabetes mellitus, type 2) ([place])   . Family history of breast cancer   . Fibrocystic breast disease   . GERD (gastroesophageal reflux disease)   . History of chicken pox   . Hypercholesteremia   . Hypertension   . Lichen planopilaris   . Urinary incontinence     Past Surgical History:  Procedure Laterality Date  . BREAST IMPLANT REMOVAL Bilateral 03/08/2019   Procedure: Removal of Bilateral Breast Implants;  Surgeon: Rolm Bookbinder, MD;  Location: [place];  Service: General;  Laterality: Bilateral;  . BREAST SURGERY  02/1975   Left Breast Biopsy-Fibrocystic Disease  . BREAST SURGERY  10/1981   Bialteral Capsulectomy Breast Surgery  . BREAST SURGERY  07/1998   Replace Bilateral Silicone Implants  . BREAST SURGERY  01/1976   Bilater breast surgery to remove fibrocystic tissue and replace with silicone implants  . CHOLECYSTECTOMY N/A 07/08/2016   Procedure: LAPAROSCOPIC CHOLECYSTECTOMY;  Surgeon: [name], MD;  Location: [facility];  Service:  General;  Laterality: N/A;  . COLONOSCOPY WITH PROPOFOL N/A 11/07/2017   Procedure: COLONOSCOPY WITH PROPOFOL;  Surgeon: Jonathon Bellows, MD;  [facility] END[facility];  Service: Gastroenterology;  Laterality: N/A;  . DILATION AND CURETTAGE OF UTERUS  01/1994  . ESOPHAGOGASTRODUODENOSCOPY (EGD) WITH PROPOFOL N/A 12/05/2016   Procedure: ESOPHAGOGASTRODUODENOSCOPY (EGD) WITH PROPOFOL;  Surgeon: Jonathon Bellows, MD;  Location: [facility] ENDOSCOPY;  Service: Endoscopy;  Laterality: N/A;  . FINGER SURGERY  2001 & 2003   Trigger Finger  . FOOT SURGERY  1980   To Relieve Pinched Nerve  . FOOT SURGERY  07/2008   Left Foot-Plantar Fasciitis  . JOINT REPLACEMENT  04/19/2013   Hip Replacement-Right  . MASTECTOMY W/ SENTINEL NODE BIOPSY Bilateral 03/08/2019   Procedure: BILATERAL MASTECTOMIES WITH LEFT AXILLARY SENTINEL LYMPH NODE BIOPSY AND BLUE DYE INJECTION;  Surgeon: Rolm Bookbinder, MD;  Location: [place];  Service: General;  Laterality: Bilateral;  . PLACEMENT OF BREAST IMPLANTS  12/26/2004   Replace Failed Implants  . RHINOPLASTY  03/1964   To Correct Deviated Septum    There were no vitals filed for this visit.   Subjective Assessment - 03/29/19 1417    Subjective  Patient underwent a bilateral mastectomies and a left sentinel node biopsy 03/08/2019. She reports the lymph nodes were lost at the hospital so the pathology is unknown. She had neoadjuvant chemotherapy but will  undergo further chemo now followed by radiation and anti-estrogen therapy.    Pertinent History  Bilateral mastectomies with left SLNB 03/08/2019.    Patient Stated Goals  See how my arm is doing    Currently in Pain?  Yes    Pain Score  3     Pain Location  Chest    Pain Orientation  Right;Left    Pain Descriptors / Indicators  Aching    Pain Type  Surgical pain    Pain Onset  1 to 4 weeks ago    Pain Frequency  Intermittent    Aggravating Factors   Nighttime    Pain Relieving Factors  Nothing         [facility] PT Assessment -  03/29/19 0001      Assessment   Medical Diagnosis  s/p bil mastectomies    Referring Provider (PT)  Dr. [name]    Onset Date/Surgical Date  03/08/19    Hand Dominance  Right    Prior Therapy  BAselines       Precautions   Precautions  Other (comment)    Precaution Comments  Recent breast surgery      Restrictions   Weight Bearing Restrictions  No      Balance Screen   Has the patient fallen in the past 6 months  No    Has the patient had a decrease in activity level because of a fear of falling?   No    Is the patient reluctant to leave their home because of a fear of falling?   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 There is fluid evident on bilateral trunk just inferior to axillae.      Posture/Postural Control   Posture/Postural Control  Postural limitations    Postural Limitations  Rounded Shoulders;Forward head      ROM / Strength   AROM / PROM / Strength  AROM;Strength      AROM   AROM Assessment Site  Shoulder    Right/Left Shoulder  Right;Left    Right Shoulder Extension  44 Degrees    Right Shoulder Flexion  119 Degrees    Right Shoulder ABduction  135 Degrees    Right Shoulder Internal Rotation  75 Degrees    Right Shoulder External Rotation  67 Degrees    Left Shoulder Extension  50 Degrees    Left Shoulder Flexion  123 Degrees    Left Shoulder ABduction  140 Degrees    Left Shoulder Internal Rotation  68 Degrees    Left Shoulder External  Rotation  68 Degrees        LYMPHEDEMA/ONCOLOGY QUESTIONNAIRE - 03/29/19 1433      Type   Cancer Type  Left breast      Surgeries   Mastectomy Date  03/08/19    Sentinel Lymph Node Biopsy Date  03/08/19      Treatment   Active Chemotherapy Treatment  No    Past Chemotherapy Treatment  Yes    Date  01/13/19    Active Radiation Treatment  No    Past Radiation Treatment  No    Current Hormone Treatment  No    Past Hormone Therapy  No      What other symptoms do you have   Are you Having Heaviness or Tightness  No    Are you having Pain  Yes    Are you having pitting edema  No    Is it Hard or Difficult finding clothes that fit  No    Do you have infections  No    Is there Decreased scar mobility  Yes    [place] Sign  No      Lymphedema Assessments   Lymphedema Assessments  Upper extremities      Right Upper Extremity Lymphedema   10 cm Proximal to Olecranon Process  28.5 cm    Olecranon Process  23.8 cm    10 cm Proximal to Ulnar Styloid Process  21 cm    Just Proximal to Ulnar Styloid Process  16.6 cm    Across Hand at [employer]  18 cm    At [place] of 2nd Digit  6.2 cm      Left Upper Extremity Lymphedema   10 cm Proximal to Olecranon Process  28.5 cm    Olecranon Process  24.7 cm    10 cm Proximal to Ulnar Styloid Process  20.9 cm    Just Proximal to Ulnar Styloid Process  15.9 cm    Across Hand at [employer]  17.8 cm    At [place] of 2nd Digit  6.5 cm          Quick Dash - 03/29/19 0001    Open a tight or new jar  Mild difficulty    Do heavy household chores (wash walls, wash floors)  Moderate difficulty    Carry a shopping bag or briefcase  No difficulty    Wash your back  No difficulty    Use a knife to cut food  No difficulty    Recreational activities in which you take some force or impact through your arm, shoulder, or hand (golf, hammering, tennis)  Mild difficulty    During the past week, to what extent has your arm, shoulder or hand problem  interfered with your normal social activities with family, friends, neighbors, or groups?  Not at all    During the past week, to what extent has your arm, shoulder or hand problem limited your work or other regular daily activities  Not at all    Arm, shoulder, or hand pain.  Moderate    Tingling (pins and needles) in your arm, shoulder, or hand  None    Difficulty Sleeping  Mild difficulty    DASH Score  15.91 %        Objective measurements completed on examination: See above findings.              PT Education - 03/29/19 1450    Education Details  Applied compression foam to bilateral trunk just inferior to axillae. Reviewed HEP given pre-op.    Person(s) Educated  Patient    Methods  Explanation;Demonstration;Handout    Comprehension  Returned demonstration;Verbalized understanding          PT Long Term Goals - 03/29/19 1525  PT LONG TERM GOAL #1   Title  Patient will be able to perform home exercise program safely and correctly including the Strength After Breast Cancer Program.    Time  4    Period  Weeks    Status  New    Target Date  04/26/19      PT LONG TERM GOAL #2   Title  Patient will increase bilateral shoulder flexion to >/= 140 degrees for increased ease reaching overhead.    Baseline  Rt 119, left 123    Time  4    Period  Weeks    Status  New    Target Date  04/26/19      PT LONG TERM GOAL #3   Title  Patient will increase bilateral abduction to >/= 150 degrees for increased ease reaching and using a drive-through,    Baseline  135 on right, 140 on left    Time  4    Period  Weeks    Status  New    Target Date  04/26/19      PT LONG TERM GOAL #4   Title  Improve DASH score to </= 7 for increased arm function.    Baseline  15.91    Time  4    Period  Weeks    Status  New    Target Date  04/26/19             Plan - 03/29/19 1521    Clinical Impression Statement  Patient is doing very well s/p bilateral mastectomies. She  had a sentinel node biopsy on the left side but it is unknown the number or status of those nodes as she reports they were lost. Her ROM is limited about 10 degrees on both the right and left shoulder and she has some mild edema present inferior to her axillae. She will benefit from PT to regain shoulder ROM and to address scar tissue and edema.    Stability/Clinical Decision Making  Stable/Uncomplicated    Clinical Decision Making  Low    Rehab Potential  Excellent    PT Frequency  2x / week    PT Duration  4 weeks    PT Treatment/Interventions  ADLs/Self Care Home Management;Manual techniques;Therapeutic exercise;Patient/family education;Passive range of motion;Scar mobilization;Manual lymph drainage    PT Next Visit Plan  PROM bil shoulders, pulleys, ROM exercises; scar mobilization. When able (before D/C), teach Strength ABC exercises    PT Home Exercise Plan  Post op shoulder ROM HEP    Consulted and Agree with Plan of Care  Patient       Patient will benefit from skilled therapeutic intervention in order to improve the following deficits and impairments:  Impaired UE functional use, Pain, Postural dysfunction, Decreased range of motion, Decreased knowledge of precautions, Increased edema, Decreased scar mobility, Increased fascial restricitons  Visit Diagnosis: 1. Malignant neoplasm of upper-outer quadrant of left breast in female, estrogen receptor positive ([place])   2. Abnormal posture   3. Aftercare following surgery for neoplasm   4. Stiffness of left shoulder, not elsewhere classified   5. Stiffness of right shoulder, not elsewhere classified   6. Localized edema        Problem List Patient Active Problem List   Diagnosis Date Noted  . Breast cancer, left breast ([place]) 03/08/2019  . 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 Lightheaded 01/05/2019  . Urine WBC increased 01/05/2019  . Diarrhea 10/15/2018  . Dehydration 10/15/2018  .  Anemia due to chemotherapy 10/15/2018   . AKI (acute kidney injury) ([place]) 10/09/2018  . Port-A-Cath in place 09/29/2018  . Diabetic retinopathy ([place]) 09/29/2018  . Genetic testing 09/23/2018  . Family history of leukemia 09/16/2018  . Family history of breast cancer   . Malignant neoplasm of upper-outer quadrant of left breast in female, estrogen receptor positive ([place]) 09/11/2018  . Mastitis 08/28/2018  . H/O bilateral breast implants 08/28/2018  . Family history of breast cancer in sister 08/28/2018  . Pre-op examination 02/17/2018  . Diverticular disease 10/17/2017  . Obesity (BMI 30-39.9) 09/02/2017  . Primary osteoarthritis of right knee 03/07/2017  . 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 Vitamin D deficiency 10/11/2016  . Family history of thyroid disorder 04/04/2015  . Osteopenia 10/04/2014  . Counseling regarding end of life decision making 10/04/2014  . 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 History of duodenal ulcer 08/02/2014  . Seasonal allergies 08/02/2014  . Essential hypertension, benign 08/02/2014  . High cholesterol 08/02/2014  . 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 History of joint replacement 03/01/2014  . Controlled type 2 diabetes with retinopathy ([place]) 11/07/2009   Annia Friendly, PT 03/29/19 3:37 PM  [place], [place], [address] Phone: [phone]   Fax:  [phone]  Name: SYRAH DAUGHTREY MRN: [id number] Date of Birth: [birth year]

## 2019-03-30 ENCOUNTER — Telehealth: Payer: Self-pay

## 2019-03-30 ENCOUNTER — Encounter: Payer: Self-pay | Admitting: Physical Therapy

## 2019-03-30 ENCOUNTER — Encounter: Payer: Self-pay | Admitting: *Deleted

## 2019-03-30 ENCOUNTER — Ambulatory Visit: Payer: Medicare Other | Admitting: Physical Therapy

## 2019-03-30 DIAGNOSIS — R293 Abnormal posture: Secondary | ICD-10-CM | POA: Diagnosis not present

## 2019-03-30 DIAGNOSIS — M25612 Stiffness of left shoulder, not elsewhere classified: Secondary | ICD-10-CM | POA: Diagnosis not present

## 2019-03-30 DIAGNOSIS — Z483 Aftercare following surgery for neoplasm: Secondary | ICD-10-CM

## 2019-03-30 DIAGNOSIS — Z170 Estrogen receptor positive status [ER+]: Secondary | ICD-10-CM | POA: Diagnosis not present

## 2019-03-30 DIAGNOSIS — C50412 Malignant neoplasm of upper-outer quadrant of left female breast: Secondary | ICD-10-CM | POA: Diagnosis not present

## 2019-03-30 DIAGNOSIS — M25611 Stiffness of right shoulder, not elsewhere classified: Secondary | ICD-10-CM | POA: Diagnosis not present

## 2019-03-30 NOTE — Telephone Encounter (Signed)
RN returned call regarding questions about having approval for dental cleaning while on Kadcyla.    RN reviewed with MD.  Per MD okay for cleaning while on Kadcyla.    Pt aware voiced understanding.  No further needs at this time.

## 2019-03-30 NOTE — Therapy (Signed)
[place], [place], [address] Phone: [phone]   Fax:  [phone]  Physical Therapy Treatment  Patient Details  Name: Katie [name] MRN: [id number] Date of Birth: 1946/02/11 Referring Provider (PT): Dr. [name]   Encounter Date: 03/30/2019  PT End of Session - 03/30/19 1451    Visit Number  2    Number of Visits  8    Date for PT Re-Evaluation  04/26/19    PT Start Time  1401    PT Stop Time  1445    PT Time Calculation (min)  44 min    Activity Tolerance  Patient tolerated treatment well    Behavior During Therapy  [facility] for tasks assessed/performed       Past Medical History:  Diagnosis Date  . Allergy   . Anemia   . 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 Blood transfusion without reported diagnosis   . Cancer ([place])    breast 08/2018  . Diverticulitis   . DJD (degenerative joint disease)   . DM type 2 (diabetes mellitus, type 2) ([place])   . Family history of breast cancer   . Fibrocystic breast disease   . GERD (gastroesophageal reflux disease)   . History of chicken pox   . Hypercholesteremia   . Hypertension   . Lichen planopilaris   . Urinary incontinence     Past Surgical History:  Procedure Laterality Date  . BREAST IMPLANT REMOVAL Bilateral 03/08/2019   Procedure: Removal of Bilateral Breast Implants;  Surgeon: Rolm Bookbinder, MD;  Location: [place];  Service: General;  Laterality: Bilateral;  . BREAST SURGERY  02/1975   Left Breast Biopsy-Fibrocystic Disease  . BREAST SURGERY  10/1981   Bialteral Capsulectomy Breast Surgery  . BREAST SURGERY  07/1998   Replace Bilateral Silicone Implants  . BREAST SURGERY  01/1976   Bilater breast surgery to remove fibrocystic tissue and replace with silicone implants  . CHOLECYSTECTOMY N/A 07/08/2016   Procedure: LAPAROSCOPIC CHOLECYSTECTOMY;  Surgeon: [name], MD;  Location: [facility];  Service:  General;  Laterality: N/A;  . COLONOSCOPY WITH PROPOFOL N/A 11/07/2017   Procedure: COLONOSCOPY WITH PROPOFOL;  Surgeon: Jonathon Bellows, MD;  Location[facility] ENDOSCOPY;  Service: Gastroenterology;  Laterality: N/A;  . DILATION AND CURETTAGE OF UTERUS  01/1994  . ESOPHAGOGASTRODUODENOSCOPY (EGD) WITH PROPOFOL N/A 12/05/2016   Procedure: ESOPHAGOGASTRODUODENOSCOPY (EGD) WITH PROPOFOL;  Surgeon: Jonathon Bellows, MD;  Location: [facility] ENDOSCOPY;  Service: Endoscopy;  Laterality: N/A;  . FINGER SURGERY  2001 & 2003   Trigger Finger  . FOOT SURGERY  1980   To Relieve Pinched Nerve  . FOOT SURGERY  07/2008   Left Foot-Plantar Fasciitis  . JOINT REPLACEMENT  04/19/2013   Hip Replacement-Right  . MASTECTOMY W/ SENTINEL NODE BIOPSY Bilateral 03/08/2019   Procedure: BILATERAL MASTECTOMIES WITH LEFT AXILLARY SENTINEL LYMPH NODE BIOPSY AND BLUE DYE INJECTION;  Surgeon: Rolm Bookbinder, MD;  Location: [place];  Service: General;  Laterality: Bilateral;  . PLACEMENT OF BREAST IMPLANTS  12/26/2004   Replace Failed Implants  . RHINOPLASTY  03/1964   To Correct Deviated Septum    There were no vitals filed for this visit.  Subjective Assessment - 03/30/19 1403    Subjective  My shoulders feel ok. They hurt mostly at night.    Pertinent History  Bilateral mastectomies with left SLNB 03/08/2019.    Patient Stated Goals  See how my arm is doing  Currently in Pain?  Yes    Pain Score  3     Pain Location  Axilla    Pain Orientation  Right;Left    Pain Descriptors / Indicators  Aching    Pain Type  Surgical pain                       [facility] Adult PT Treatment/Exercise - 03/30/19 0001      Shoulder Exercises: Supine   Horizontal ABduction  Strengthening;Both;10 reps;Theraband   pt returned therapist demo   Theraband Level (Shoulder Horizontal ABduction)  Level 1 (Yellow)    External Rotation  Strengthening;Both;10 reps;Theraband   pt returned therapist demo   Theraband Level (Shoulder External  Rotation)  Level 1 (Yellow)    Flexion  Strengthening;Both;10 reps;Theraband   narrow and wide grip, pt returned therapist demo   Theraband Level (Shoulder Flexion)  Level 1 (Yellow)    Diagonals  Strengthening;Both;10 reps;Theraband   pt returned therapist demo   Theraband Level (Shoulder Diagonals)  Level 1 (Yellow)      Shoulder Exercises: Pulleys   Flexion  2 minutes   pt returned therapist demo   ABduction  2 minutes   pt returned therapist demo     Shoulder Exercises: Therapy Ball   Flexion  10 reps   with stretch at end range     Manual Therapy   Manual Therapy  Passive ROM    Passive ROM  in supine to bilateral shoulders in direction of flexion, abduction and ER                  PT Long Term Goals - 03/29/19 1525      PT LONG TERM GOAL #1   Title  Patient will be able to perform home exercise program safely and correctly including the Strength After Breast Cancer Program.    Time  4    Period  Weeks    Status  New    Target Date  04/26/19      PT LONG TERM GOAL #2   Title  Patient will increase bilateral shoulder flexion to >/= 140 degrees for increased ease reaching overhead.    Baseline  Rt 119, left 123    Time  4    Period  Weeks    Status  New    Target Date  04/26/19      PT LONG TERM GOAL #3   Title  Patient will increase bilateral abduction to >/= 150 degrees for increased ease reaching and using a drive-through,    Baseline  135 on right, 140 on left    Time  4    Period  Weeks    Status  New    Target Date  04/26/19      PT LONG TERM GOAL #4   Title  Improve DASH score to </= 7 for increased arm function.    Baseline  15.91    Time  4    Period  Weeks    Status  New    Target Date  04/26/19            Plan - 03/30/19 1451    Clinical Impression Statement  Began PROM and AAROM exercises today. Pt was able to obtain full PROM. Instructed pt in supine scapular strengthening exercises today and issued them as part of pt's home  exercise program. She has been wearing foam in her bra along bilateral trunk and feels that  is helping.    PT Frequency  2x / week    PT Duration  4 weeks    PT Treatment/Interventions  ADLs/Self Care Home Management;Manual techniques;Therapeutic exercise;Patient/family education;Passive range of motion;Scar mobilization;Manual lymph drainage    PT Next Visit Plan  PROM bil shoulders, pulleys, ROM exercises; scar mobilization. When able (before D/C), teach Strength ABC exercises    PT Home Exercise Plan  Post op shoulder ROM HEP, supine scap series    Consulted and Agree with Plan of Care  Patient       Patient will benefit from skilled therapeutic intervention in order to improve the following deficits and impairments:  Impaired UE functional use, Pain, Postural dysfunction, Decreased range of motion, Decreased knowledge of precautions, Increased edema, Decreased scar mobility, Increased fascial restricitons  Visit Diagnosis: 1. Stiffness of right shoulder, not elsewhere classified   2. Stiffness of left shoulder, not elsewhere classified   3. Aftercare following surgery for neoplasm   4. Abnormal posture        Problem List Patient Active Problem List   Diagnosis Date Noted  . Breast cancer, left breast ([place]) 03/08/2019  . 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 Lightheaded 01/05/2019  . Urine WBC increased 01/05/2019  . Diarrhea 10/15/2018  . Dehydration 10/15/2018  . Anemia due to chemotherapy 10/15/2018  . AKI (acute kidney injury) ([place]) 10/09/2018  . Port-A-Cath in place 09/29/2018  . Diabetic retinopathy ([place]) 09/29/2018  . Genetic testing 09/23/2018  . Family history of leukemia 09/16/2018  . Family history of breast cancer   . Malignant neoplasm of upper-outer quadrant of left breast in female, estrogen receptor positive ([place]) 09/11/2018  . Mastitis 08/28/2018  . H/O bilateral breast implants 08/28/2018  . Family history of breast cancer in sister 08/28/2018  .  Pre-op examination 02/17/2018  . Diverticular disease 10/17/2017  . Obesity (BMI 30-39.9) 09/02/2017  . Primary osteoarthritis of right knee 03/07/2017  . 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 Vitamin D deficiency 10/11/2016  . Family history of thyroid disorder 04/04/2015  . Osteopenia 10/04/2014  . Counseling regarding end of life decision making 10/04/2014  . 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 History of duodenal ulcer 08/02/2014  . Seasonal allergies 08/02/2014  . Essential hypertension, benign 08/02/2014  . High cholesterol 08/02/2014  . 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 History of joint replacement 03/01/2014  . Controlled type 2 diabetes with retinopathy ([place]) 11/07/2009    Allyson Sabal [facility] 03/30/2019, 2:54 PM  [place], [place], [address] Phone: [phone]   Fax:  [phone]  Name: Katie [name] MRN: [id number] Date of Birth: 31-May-1946  Manus Gunning, PT 03/30/19 2:54 PM

## 2019-03-30 NOTE — Patient Instructions (Signed)

## 2019-04-01 DIAGNOSIS — L668 Other cicatricial alopecia: Secondary | ICD-10-CM | POA: Diagnosis not present

## 2019-04-01 DIAGNOSIS — D1801 Hemangioma of skin and subcutaneous tissue: Secondary | ICD-10-CM | POA: Diagnosis not present

## 2019-04-02 ENCOUNTER — Other Ambulatory Visit: Payer: Self-pay | Admitting: Family Medicine

## 2019-04-02 ENCOUNTER — Encounter

## 2019-04-05 ENCOUNTER — Ambulatory Visit: Payer: Medicare Other | Admitting: Rehabilitation

## 2019-04-07 NOTE — Assessment & Plan Note (Signed)
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 Neoadjuvant chemotherapy with [place] Perjeta 6 cyclescompleted 5/6/2020followed by Herceptin/ Perjeta vs Kadcylamaintenance for 1 year 2. Followed by Bil mastectomies with sentinel lymph node study 03/08/2019 3. Followed by adjuvant radiation therapy 4.  Followed by adjuvant antiestrogen therapy ------------------------------------------------------------------------------------------------------------------------------------ 03/08/2019: Bilateral mastectomies: Left mastectomy: Grade 2 IDC spanning 7.5 cm, margins negative, right mastectomy: Benign, ER 95%, PR 5%, HER-2 1+ negative pathology lost the lymph node excision.   Treatment plan: Kadcyla maintenance every 3 weeks  Radiation will start soon.  Antiestrogen therapy after radiation is complete Return to clinic every 3 weeks with labs and MD visit and follow-up for Kadcyla.

## 2019-04-08 ENCOUNTER — Telehealth: Payer: Self-pay | Admitting: *Deleted

## 2019-04-08 NOTE — Telephone Encounter (Signed)
Received call from pt wanting to know when she will start radiation.  Pt states she had her drains removed two weeks ago and has a f/u apt next week with Dr. [name].  RN reached out to [place], RN with Dr. [name] who stated that MD would like to assess pt before deciding on radiation start date.  Pt notified of this and verbalized understanding.

## 2019-04-12 ENCOUNTER — Encounter: Payer: Self-pay | Admitting: Rehabilitation

## 2019-04-12 ENCOUNTER — Ambulatory Visit: Payer: Medicare Other | Admitting: Rehabilitation

## 2019-04-12 ENCOUNTER — Other Ambulatory Visit: Payer: Self-pay

## 2019-04-12 DIAGNOSIS — M25612 Stiffness of left shoulder, not elsewhere classified: Secondary | ICD-10-CM | POA: Diagnosis not present

## 2019-04-12 DIAGNOSIS — Z170 Estrogen receptor positive status [ER+]: Secondary | ICD-10-CM | POA: Diagnosis not present

## 2019-04-12 DIAGNOSIS — C50412 Malignant neoplasm of upper-outer quadrant of left female breast: Secondary | ICD-10-CM

## 2019-04-12 DIAGNOSIS — Z483 Aftercare following surgery for neoplasm: Secondary | ICD-10-CM

## 2019-04-12 DIAGNOSIS — M25611 Stiffness of right shoulder, not elsewhere classified: Secondary | ICD-10-CM | POA: Diagnosis not present

## 2019-04-12 DIAGNOSIS — R600 Localized edema: Secondary | ICD-10-CM

## 2019-04-12 DIAGNOSIS — R293 Abnormal posture: Secondary | ICD-10-CM | POA: Diagnosis not present

## 2019-04-12 NOTE — Therapy (Signed)
[place], [place], [address] Phone: [phone]   Fax:  [phone]  Physical Therapy Treatment  Patient Details  Name: Katie [name] MRN: [id number] Date of Birth: 28-Mar-1946 Referring Provider (PT): Dr. [name]   Encounter Date: 04/12/2019  PT End of Session - 04/12/19 1120    Visit Number  3    Number of Visits  8    Date for PT Re-Evaluation  04/26/19    PT Start Time  1032    PT Stop Time  1117    PT Time Calculation (min)  45 min    Activity Tolerance  Patient tolerated treatment well    Behavior During Therapy  [facility] for tasks assessed/performed       Past Medical History:  Diagnosis Date  . Allergy   . Anemia   . 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 Blood transfusion without reported diagnosis   . Cancer ([place])    breast 08/2018  . Diverticulitis   . DJD (degenerative joint disease)   . DM type 2 (diabetes mellitus, type 2) ([place])   . Family history of breast cancer   . Fibrocystic breast disease   . GERD (gastroesophageal reflux disease)   . History of chicken pox   . Hypercholesteremia   . Hypertension   . Lichen planopilaris   . Urinary incontinence     Past Surgical History:  Procedure Laterality Date  . BREAST IMPLANT REMOVAL Bilateral 03/08/2019   Procedure: Removal of Bilateral Breast Implants;  Surgeon: Rolm Bookbinder, MD;  Location: [place];  Service: General;  Laterality: Bilateral;  . BREAST SURGERY  02/1975   Left Breast Biopsy-Fibrocystic Disease  . BREAST SURGERY  10/1981   Bialteral Capsulectomy Breast Surgery  . BREAST SURGERY  07/1998   Replace Bilateral Silicone Implants  . BREAST SURGERY  01/1976   Bilater breast surgery to remove fibrocystic tissue and replace with silicone implants  . CHOLECYSTECTOMY N/A 07/08/2016   Procedure: LAPAROSCOPIC CHOLECYSTECTOMY;  Surgeon: [name], MD;  Location: [facility];  Service:  General;  Laterality: N/A;  . COLONOSCOPY WITH PROPOFOL N/A 11/07/2017   Procedure: COLONOSCOPY WITH PROPOFOL;  Surgeon: Jonathon Bellows, MD;  Location: [facility] ENDOSCOPY;  Service: Gastroenterology;  Laterality: N/A;  . DILATION AND CURETTAGE OF UTERUS  01/1994  . ESOPHAGOGASTRODUODENOSCOPY (EGD) WITH PROPOFOL N/A 12/05/2016   Procedure: ESOPHAGOGASTRODUODENOSCOPY (EGD) WITH PROPOFOL;  Surgeon: Jonathon Bellows, MD;  Location: [facility] ENDOSCOPY;  Service: Endoscopy;  Laterality: N/A;  . FINGER SURGERY  2001 & 2003   Trigger Finger  . FOOT SURGERY  1980   To Relieve Pinched Nerve  . FOOT SURGERY  07/2008   Left Foot-Plantar Fasciitis  . JOINT REPLACEMENT  04/19/2013   Hip Replacement-Right  . MASTECTOMY W/ SENTINEL NODE BIOPSY Bilateral 03/08/2019   Procedure: BILATERAL MASTECTOMIES WITH LEFT AXILLARY SENTINEL LYMPH NODE BIOPSY AND BLUE DYE INJECTION;  Surgeon: Rolm Bookbinder, MD;  Location: [place];  Service: General;  Laterality: Bilateral;  . PLACEMENT OF BREAST IMPLANTS  12/26/2004   Replace Failed Implants  . RHINOPLASTY  03/1964   To Correct Deviated Septum    There were no vitals filed for this visit.  Subjective Assessment - 04/12/19 1031    Subjective  They just hurt at night. I have an appointment with Dr. [name] for Thursday for some swelling on the Lt chest wall    Pertinent History  Bilateral mastectomies with left SLNB 03/08/2019.  Currently in Pain?  Yes    Pain Score  1     Pain Location  Shoulder    Pain Orientation  Right;Left    Pain Descriptors / Indicators  Aching    Pain Type  Surgical pain         [facility] PT Assessment - 04/12/19 0001      Observation/Other Assessments   Observations  pt with significant seroma present Lt and mild Rt chest wall with rebound waving occuring to palpation.  Pt sees Dr. [name] Thursday      AROM   Right Shoulder Flexion  143 Degrees    Right Shoulder ABduction  174 Degrees    Left Shoulder Flexion  147 Degrees    Left Shoulder  ABduction  155 Degrees                   [facility] Adult PT Treatment/Exercise - 04/12/19 0001      Exercises   Exercises  Shoulder      Shoulder Exercises: Supine   Protraction  10 reps;Both    Protraction Limitations  pro/retraction straight arm    Flexion Limitations  alternating active flexion x 10 each      Shoulder Exercises: Seated   Other Seated Exercises  mermaid motion alternating x 10       Shoulder Exercises: Standing   Row  15 reps    Theraband Level (Shoulder Row)  Level 2 (Red)      Shoulder Exercises: Pulleys   Flexion  2 minutes    ABduction  2 minutes      Shoulder Exercises: Therapy Ball   Flexion  10 reps      Manual Therapy   Manual Therapy  Edema management    Passive ROM  in supine to bilateral shoulders in direction of flexion, abduction and ER                  PT Long Term Goals - 04/12/19 1123      PT LONG TERM GOAL #1   Title  Patient will be able to perform home exercise program safely and correctly including the Strength After Breast Cancer Program.    Status  On-going      PT LONG TERM GOAL #2   Title  Patient will increase bilateral shoulder flexion to >/= 140 degrees for increased ease reaching overhead.    Status  Achieved      PT LONG TERM GOAL #3   Status  Achieved      PT LONG TERM GOAL #4   Title  Improve DASH score to </= 7 for increased arm function.    Status  On-going            Plan - 04/12/19 1120    Clinical Impression Statement  Pt was unable to come last week to to appointment availability but has still been able to make good progress with her ROM.  Significant improvements in both flexion and abduction bilaterally still with pull on the Lt only.  pt with significant seroma present Lt and mild Rt chest wall with rebound waving occuring to palpation.  Pt sees Dr. [name] Thursday.    PT Frequency  2x / week    PT Duration  4 weeks    PT Treatment/Interventions  ADLs/Self Care Home  Management;Manual techniques;Therapeutic exercise;Patient/family education;Passive range of motion;Scar mobilization;Manual lymph drainage    PT Next Visit Plan  any MLD or edema work needed if seroma was  drained or not? progress active movements with some resistance, PROM bil shoulders, pulleys, ROM exercises; scar mobilization. When able (before D/C), teach Strength ABC exercises    Consulted and Agree with Plan of Care  Patient       Patient will benefit from skilled therapeutic intervention in order to improve the following deficits and impairments:  Impaired UE functional use, Pain, Postural dysfunction, Decreased range of motion, Decreased knowledge of precautions, Increased edema, Decreased scar mobility, Increased fascial restricitons  Visit Diagnosis: 1. Stiffness of right shoulder, not elsewhere classified   2. Stiffness of left shoulder, not elsewhere classified   3. Aftercare following surgery for neoplasm   4. Abnormal posture   5. Malignant neoplasm of upper-outer quadrant of left breast in female, estrogen receptor positive ([place])   6. Localized edema        Problem List Patient Active Problem List   Diagnosis Date Noted  . Breast cancer, left breast ([place]) 03/08/2019  . 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 Lightheaded 01/05/2019  . Urine WBC increased 01/05/2019  . Diarrhea 10/15/2018  . Dehydration 10/15/2018  . Anemia due to chemotherapy 10/15/2018  . AKI (acute kidney injury) ([place]) 10/09/2018  . Port-A-Cath in place 09/29/2018  . Diabetic retinopathy ([place]) 09/29/2018  . Genetic testing 09/23/2018  . Family history of leukemia 09/16/2018  . Family history of breast cancer   . Malignant neoplasm of upper-outer quadrant of left breast in female, estrogen receptor positive ([place]) 09/11/2018  . Mastitis 08/28/2018  . H/O bilateral breast implants 08/28/2018  . Family history of breast cancer in sister 08/28/2018  . Pre-op examination 02/17/2018  .  Diverticular disease 10/17/2017  . Obesity (BMI 30-39.9) 09/02/2017  . Primary osteoarthritis of right knee 03/07/2017  . 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 Vitamin D deficiency 10/11/2016  . Family history of thyroid disorder 04/04/2015  . Osteopenia 10/04/2014  . Counseling regarding end of life decision making 10/04/2014  . 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 History of duodenal ulcer 08/02/2014  . Seasonal allergies 08/02/2014  . Essential hypertension, benign 08/02/2014  . High cholesterol 08/02/2014  . 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 History of joint replacement 03/01/2014  . Controlled type 2 diabetes with retinopathy ([place]) 11/07/2009    Stark Bray 04/12/2019, 11:24 AM  [place], [place], [address] Phone: [phone]   Fax:  [phone]  Name: HAYZEL RUBERG MRN: [id number] Date of Birth: 1945-09-11

## 2019-04-13 ENCOUNTER — Ambulatory Visit: Payer: Medicare Other | Admitting: Hematology and Oncology

## 2019-04-13 ENCOUNTER — Other Ambulatory Visit: Payer: Medicare Other

## 2019-04-13 ENCOUNTER — Ambulatory Visit: Payer: Medicare Other

## 2019-04-13 NOTE — Progress Notes (Signed)
Patient Care Team: [name], MD as PCP - General (Family Medicine) Birder [name], MD as Referring Physician (Ophthalmology) [name], MD as Consulting Physician (Dermatology) [name], MD (Inactive) as Consulting Physician (Orthopedic Surgery) [name], MD as Consulting Physician (General Surgery) [name], MD as Consulting Physician (Dentistry) [name], MD as Consulting Physician (General Surgery) [name], MD as Consulting Physician (Hematology and Oncology) [name], MD as Attending Physician (Radiation Oncology)  DIAGNOSIS:    ICD-10-CM   1. Malignant neoplasm of upper-outer quadrant of left breast in female, estrogen receptor positive ([place])  C50.412    Z17.0     SUMMARY OF ONCOLOGIC HISTORY: Oncology History  Malignant neoplasm of upper-outer quadrant of left breast in female, estrogen receptor positive ([place])  09/14/2018 Initial Diagnosis   With prior history of breast implants that were ruptured/replaced, patient had cellulitis LIQ left breast for 1 week, and UOQ asymmetry 1.5 cm, MRI right breast biopsy benign, left breast numerous masses 7 cm skin thickening: 2 biopsies from left breast: Grade 2-3 IDC ER PR positive, HER-2 positive, Ki-67 50%   09/16/2018 Cancer Staging   Staging form: Breast, AJCC 8th Edition - Clinical stage from 09/16/2018: Stage IIIB (cT4, cN0, cM0, G3, ER+, PR+, HER2+) - Signed by [name], MD on 09/16/2018   09/29/2018 -  Neo-Adjuvant Chemotherapy   TCHP q 3 weeks   01/19/2019 Breast MRI   Significant interval improvement, the multiple left breast masses and areas of non mass enhancement in all 4 quadrants have decreased in size and extent. The mass previously measuring 1.4 cm with diffuse enhancement now measures 9 mm with heterogeneous areas of enhancement. Current greatest transverse dimension is 5.4 x 3.9 cm, previously 7 cm x 5cm. 2. 4 mm masslike focus of enhancement at the base of the  right nipple is unchanged.   02/10/2019 - 03/23/2019 Chemotherapy   The patient had trastuzumab (HERCEPTIN) 450 mg in sodium chloride 0.9 % 250 mL chemo infusion, 420 mg (100 % of original dose 6 mg/kg), Intravenous,  Once, 2 of 5 cycles Dose modification: 6 mg/kg (original dose 6 mg/kg, Cycle 1, Reason: Provider Judgment) Administration: 450 mg (02/10/2019), 450 mg (03/03/2019)  for chemotherapy treatment.    03/08/2019 Surgery   Bilateral mastectomies [name]: right breast: no malignancy; left breast: IDC, grade 2, scattered over 7.5cm, clear margins.    03/24/2019 -  Chemotherapy   The patient had ado-trastuzumab emtansine (KADCYLA) 260 mg in sodium chloride 0.9 % 250 mL chemo infusion, 3.6 mg/kg = 260 mg, Intravenous, Once, 1 of 15 cycles Administration: 260 mg (03/24/2019)  for chemotherapy treatment.      CHIEF COMPLIANT: Follow-up of Kadcyla maintenance  INTERVAL HISTORY: Katie [name] is a 73 y.o. with above-mentioned history of HER-2 positiveleft breast cancer treated with neoadjuvant chemotherapy followed by bilateral mastectomies. She is currently on adjuvant treatment with Kadcyla maintenance. She presents to the clinic today for treatment.  Other than mild fatigue she is tolerating the treatment very well.  She no longer has diarrhea.  She is excited that the hair is coming back.  REVIEW OF SYSTEMS:   Constitutional: Denies fevers, chills or abnormal weight loss Eyes: Denies blurriness of vision Ears, nose, mouth, throat, and face: Denies mucositis or sore throat Respiratory: Denies cough, dyspnea or wheezes Cardiovascular: Denies palpitation, chest discomfort Gastrointestinal: Denies nausea, heartburn or change in bowel habits Skin: Denies abnormal skin rashes Lymphatics: Denies new lymphadenopathy or easy bruising Neurological: Denies numbness, tingling or new  weaknesses Behavioral/Psych: Mood is stable, no new changes  Extremities: No lower extremity edema Breast:  denies any pain or lumps or nodules in either breasts All other systems were reviewed with the patient and are negative.  I have reviewed the past medical history, past surgical history, social history and family history with the patient and they are unchanged from previous note.  ALLERGIES:  is allergic to erythromycin; ciprofloxacin; daypro [oxaprozin]; enalapril maleate; nsaids; and vioxx [rofecoxib].  MEDICATIONS:  Current Outpatient Medications  Medication Sig Dispense Refill  . amLODipine (NORVASC) 5 MG tablet Take 1 tablet (5 mg total) by mouth daily. 30 tablet 11  . atorvastatin (LIPITOR) 10 MG tablet TAKE 1 TABLET(10 MG) BY MOUTH DAILY 90 tablet 3  . Azelastine HCl 137 MCG/SPRAY SOLN instill 1 spray into each nostril twice a day for 1 week then if needed (Patient taking differently: Place 1 spray into both nostrils daily as needed (congestion). ) 30 mL 5  . cholecalciferol (VITAMIN D) 400 units TABS tablet Take 400 Units by mouth 2 (two) times daily.    . clobetasol (TEMOVATE) 0.05 % external solution Apply 1 application topically 2 (two) times daily as needed (scalp). Reported on 08/16/2015    . fluticasone (FLONASE) 50 MCG/ACT nasal spray INSTILL 2 SPRAYS INTO EACH NOSTRIL ONCE DAILY (Patient taking differently: Place 2 sprays into both nostrils daily. ) 16 g 5  . glucose blood test strip USE TO CHECK BLOOD SUGAR DAILY 50 each 12  . lidocaine-prilocaine (EMLA) cream Apply to affected area once 30 g 3  . loratadine (CLARITIN) 10 MG tablet Take 10 mg by mouth daily.    . metFORMIN (GLUCOPHAGE-XR) 500 MG 24 hr tablet Take 1 tablet (500 mg total) by mouth daily with breakfast. (Patient taking differently: Take 500 mg by mouth at bedtime. ) 90 tablet 3  . metroNIDAZOLE (METROCREAM) 0.75 % cream Apply 1 application topically 2 (two) times daily as needed (rosacea).     . Multiple Vitamin (MULTIVITAMIN WITH MINERALS) TABS tablet Take 1 tablet by mouth daily.    [name] oxyCODONE (OXY  IR/ROXICODONE) 5 MG immediate release tablet Take 1 tablet (5 mg total) by mouth every 6 (six) hours as needed for moderate pain. 12 tablet 0  . triamcinolone cream (KENALOG) 0.1 % Apply 1 application topically 2 (two) times daily. (Patient taking differently: Apply 1 application topically 2 (two) times daily as needed (skin rashes). ) 15 g 0  . valACYclovir (VALTREX) 500 MG tablet Take 1 tablet (500 mg total) by mouth 2 (two) times daily. (Patient taking differently: Take 500 mg by mouth 2 (two) times daily as needed (flare up). ) 6 tablet 2   No current facility-administered medications for this visit.     PHYSICAL EXAMINATION: ECOG PERFORMANCE STATUS: 1 - Symptomatic but completely ambulatory  Vitals:   04/14/19 0835  BP: (!) 162/59  Pulse: 62  Temp: 98 F (36.7 C)  SpO2: 98%   Filed Weights   04/14/19 0835  Weight: 150 lb 9.6 oz (68.3 kg)    GENERAL: alert, no distress and comfortable SKIN: skin color, texture, turgor are normal, no rashes or significant lesions EYES: normal, Conjunctiva are pink and non-injected, sclera clear OROPHARYNX: no exudate, no erythema and lips, buccal mucosa, and tongue normal  NECK: supple, thyroid normal size, non-tender, without nodularity LYMPH: no palpable lymphadenopathy in the cervical, axillary or inguinal LUNGS: clear to auscultation and percussion with normal breathing effort HEART: regular rate & rhythm and no murmurs and  no lower extremity edema ABDOMEN: abdomen soft, non-tender and normal bowel sounds MUSCULOSKELETAL: no cyanosis of digits and no clubbing  NEURO: alert & oriented x 3 with fluent speech, no focal motor/sensory deficits EXTREMITIES: No lower extremity edema  LABORATORY DATA:  I have reviewed the data as listed CMP Latest Ref Rng & Units 03/09/2019 02/25/2019 02/15/2019  Glucose 70 - 99 mg/dL 147(H) 99 115(H)  BUN 8 - 23 mg/dL _0 Creatinine 0.44 - 1.00 mg/dL 0.88 0.82 0.78  Sodium 135 - 145 mmol/L 136 140 140   Potassium 3.5 - 5.1 mmol/L 4.6 4.1 4.2  Chloride 98 - 111 mmol/L 106 106 107  CO2 22 - 32 mmol/L _1 Calcium 8.9 - 10.3 mg/dL 8.8(L) 9.5 9.5  Total Protein 6.5 - 8.1 g/dL - 7.2 -  Total Bilirubin 0.3 - 1.2 mg/dL - 0.5 -  Alkaline Phos 38 - 126 U/L - 80 -  AST 15 - 41 U/L - 19 -  ALT 0 - 44 U/L - 19 -    Lab Results  Component Value Date   WBC 4.4 04/14/2019   HGB 11.4 (L) 04/14/2019   HCT 33.8 (L) 04/14/2019   MCV 104.3 (H) 04/14/2019   PLT 143 (L) 04/14/2019   NEUTROABS 2.0 04/14/2019    ASSESSMENT & PLAN:  Malignant neoplasm of upper-outer quadrant of left breast in female, estrogen receptor positive ([place]) With prior history of breast implants that were ruptured/replaced, patient had cellulitis LIQ left breast for 1 week, and UOQ asymmetry 1.5 cm, MRI right breast biopsy benign, left breast numerous masses 7 cm skin thickening: 2 biopsies from left breast: Grade 2-3 IDC ER PR positive, HER-2 positive, Ki-67 50% Clinical stage: T4 N0 M0 stage IIIb  Treatment plan 1. Neoadjuvant chemotherapy with Lockhart Perjeta 6 cyclescompleted 5/6/2020followed by Herceptin/ Perjeta vs Kadcylamaintenance for 1 year 2. Followed by Bil mastectomies with sentinel lymph node study 03/08/2019 3. Followed by adjuvant radiation therapy 4.  Followed by adjuvant antiestrogen therapy ------------------------------------------------------------------------------------------------------------------------------------ 03/08/2019: Bilateral mastectomies: Left mastectomy: Grade 2 IDC spanning 7.5 cm, margins negative, right mastectomy: Benign, ER 95%, PR 5%, HER-2 1+ negative pathology lost the lymph node excision.   Treatment plan: Kadcyla maintenance every 3 weeks Toxicities: No major side effects. Market improvement in the hemoglobin levels today's hemoglobin is 11.4  Radiation will start soon.  Antiestrogen therapy after radiation is complete Return to clinic every 3 weeks with labs and MD visit  and follow-up for Kadcyla.    No orders of the defined types were placed in this encounter.  The patient has a good understanding of the overall plan. she agrees with it. she will call with any problems that may develop before the next visit here.  [name], MD 04/14/2019  Julious Oka Dorshimer am acting as scribe for Dr. [name].  I have reviewed the above documentation for accuracy and completeness, and I agree with the above.

## 2019-04-14 ENCOUNTER — Other Ambulatory Visit: Payer: Medicare Other

## 2019-04-14 ENCOUNTER — Inpatient Hospital Stay: Payer: Medicare Other | Attending: Hematology and Oncology

## 2019-04-14 ENCOUNTER — Inpatient Hospital Stay (HOSPITAL_BASED_OUTPATIENT_CLINIC_OR_DEPARTMENT_OTHER): Payer: Medicare Other | Admitting: Hematology and Oncology

## 2019-04-14 ENCOUNTER — Inpatient Hospital Stay: Payer: Medicare Other

## 2019-04-14 ENCOUNTER — Ambulatory Visit: Payer: Medicare Other

## 2019-04-14 ENCOUNTER — Ambulatory Visit: Payer: Medicare Other | Admitting: Hematology and Oncology

## 2019-04-14 ENCOUNTER — Other Ambulatory Visit: Payer: Self-pay

## 2019-04-14 DIAGNOSIS — C50412 Malignant neoplasm of upper-outer quadrant of left female breast: Secondary | ICD-10-CM | POA: Diagnosis not present

## 2019-04-14 DIAGNOSIS — Z9882 Breast implant status: Secondary | ICD-10-CM | POA: Diagnosis not present

## 2019-04-14 DIAGNOSIS — Z5112 Encounter for antineoplastic immunotherapy: Secondary | ICD-10-CM | POA: Insufficient documentation

## 2019-04-14 DIAGNOSIS — Z170 Estrogen receptor positive status [ER+]: Secondary | ICD-10-CM | POA: Insufficient documentation

## 2019-04-14 DIAGNOSIS — Z79899 Other long term (current) drug therapy: Secondary | ICD-10-CM | POA: Diagnosis not present

## 2019-04-14 DIAGNOSIS — Z7984 Long term (current) use of oral hypoglycemic drugs: Secondary | ICD-10-CM | POA: Insufficient documentation

## 2019-04-14 DIAGNOSIS — Z95828 Presence of other vascular implants and grafts: Secondary | ICD-10-CM

## 2019-04-14 DIAGNOSIS — Z9013 Acquired absence of bilateral breasts and nipples: Secondary | ICD-10-CM | POA: Diagnosis not present

## 2019-04-14 LAB — CBC WITH DIFFERENTIAL (CANCER CENTER ONLY)
Abs Immature Granulocytes: 0 10*3/uL (ref 0.00–0.07)
Basophils Absolute: 0 10*3/uL (ref 0.0–0.1)
Basophils Relative: 1 %
Eosinophils Absolute: 0.3 10*3/uL (ref 0.0–0.5)
Eosinophils Relative: 7 %
HCT: 33.8 % — ABNORMAL LOW (ref 36.0–46.0)
Hemoglobin: 11.4 g/dL — ABNORMAL LOW (ref 12.0–15.0)
Immature Granulocytes: 0 %
Lymphocytes Relative: 35 %
Lymphs Abs: 1.5 10*3/uL (ref 0.7–4.0)
MCH: 35.2 pg — ABNORMAL HIGH (ref 26.0–34.0)
MCHC: 33.7 g/dL (ref 30.0–36.0)
MCV: 104.3 fL — ABNORMAL HIGH (ref 80.0–100.0)
Monocytes Absolute: 0.5 10*3/uL (ref 0.1–1.0)
Monocytes Relative: 11 %
Neutro Abs: 2 10*3/uL (ref 1.7–7.7)
Neutrophils Relative %: 46 %
Platelet Count: 143 10*3/uL — ABNORMAL LOW (ref 150–400)
RBC: 3.24 MIL/uL — ABNORMAL LOW (ref 3.87–5.11)
RDW: 14 % (ref 11.5–15.5)
WBC Count: 4.4 10*3/uL (ref 4.0–10.5)
nRBC: 0 % (ref 0.0–0.2)

## 2019-04-14 LAB — CMP (CANCER CENTER ONLY)
ALT: 33 U/L (ref 0–44)
AST: 27 U/L (ref 15–41)
Albumin: 4 g/dL (ref 3.5–5.0)
Alkaline Phosphatase: 83 U/L (ref 38–126)
Anion gap: 11 (ref 5–15)
BUN: 16 mg/dL (ref 8–23)
CO2: 24 mmol/L (ref 22–32)
Calcium: 9.6 mg/dL (ref 8.9–10.3)
Chloride: 105 mmol/L (ref 98–111)
Creatinine: 0.8 mg/dL (ref 0.44–1.00)
GFR, Est AFR Am: >60 mL/min (ref >60)
GFR, Estimated: >60 mL/min (ref >60)
Glucose, Bld: 107 mg/dL — ABNORMAL HIGH (ref 70–99)
Potassium: 3.8 mmol/L (ref 3.5–5.1)
Sodium: 140 mmol/L (ref 135–145)
Total Bilirubin: 0.4 mg/dL (ref 0.3–1.2)
Total Protein: 7.2 g/dL (ref 6.5–8.1)

## 2019-04-14 MED ORDER — SODIUM CHLORIDE 0.9% FLUSH
10.0000 mL | Freq: Once | INTRAVENOUS | Status: AC
  Administered 2019-04-14: 10 mL
  Filled 2019-04-14: qty 10

## 2019-04-14 MED ORDER — ACETAMINOPHEN 325 MG PO TABS
650.0000 mg | ORAL_TABLET | Freq: Once | ORAL | Status: AC
  Administered 2019-04-14: 650 mg via ORAL

## 2019-04-14 MED ORDER — SODIUM CHLORIDE 0.9% FLUSH
10.0000 mL | INTRAVENOUS | Status: DC | PRN
  Administered 2019-04-14: 10 mL
  Filled 2019-04-14: qty 10

## 2019-04-14 MED ORDER — HEPARIN SOD (PORK) LOCK FLUSH 100 UNIT/ML IV SOLN
500.0000 [IU] | Freq: Once | INTRAVENOUS | Status: AC | PRN
  Administered 2019-04-14: 500 [IU]
  Filled 2019-04-14: qty 5

## 2019-04-14 MED ORDER — SODIUM CHLORIDE 0.9 % IV SOLN
Freq: Once | INTRAVENOUS | Status: AC
  Administered 2019-04-14: 09:00:00 via INTRAVENOUS
  Filled 2019-04-14: qty 250

## 2019-04-14 MED ORDER — DIPHENHYDRAMINE HCL 25 MG PO CAPS
ORAL_CAPSULE | ORAL | Status: AC
  Filled 2019-04-14: qty 2

## 2019-04-14 MED ORDER — ACETAMINOPHEN 325 MG PO TABS
ORAL_TABLET | ORAL | Status: AC
  Filled 2019-04-14: qty 2

## 2019-04-14 MED ORDER — DIPHENHYDRAMINE HCL 25 MG PO CAPS
50.0000 mg | ORAL_CAPSULE | Freq: Once | ORAL | Status: AC
  Administered 2019-04-14: 50 mg via ORAL

## 2019-04-14 MED ORDER — SODIUM CHLORIDE 0.9 % IV SOLN
3.6000 mg/kg | Freq: Once | INTRAVENOUS | Status: AC
  Administered 2019-04-14: 260 mg via INTRAVENOUS
  Filled 2019-04-14: qty 8

## 2019-04-14 NOTE — Patient Instructions (Signed)
Coronavirus (COVID-19) Are you at risk?  Are you at risk for the Coronavirus (COVID-19)?  To be considered HIGH RISK for Coronavirus (COVID-19), you have to meet the following criteria:  . Traveled to [place], [place], [place], [place] or [place]; or in the [place] to [place], [place], [place], or [place]; and have fever, cough, and shortness of breath within the last 2 weeks of travel OR . Been in close contact with a person diagnosed with COVID-19 within the last 2 weeks and have fever, cough, and shortness of breath . IF YOU DO NOT MEET THESE CRITERIA, YOU ARE CONSIDERED LOW RISK FOR COVID-19.  What to do if you are HIGH RISK for COVID-19?  . If you are having a medical emergency, call 911. . Seek medical care right away. Before you go to a doctor's office, urgent care or emergency department, call ahead and tell them about your recent travel, contact with someone diagnosed with COVID-19, and your symptoms. You should receive instructions from your physician's office regarding next steps of care.  . When you arrive at healthcare provider, tell the healthcare staff immediately you have returned from visiting [place], [place], [place], [place] or [place]; or traveled in the [place] to [place], [place], [place], or [place]; in the last two weeks or you have been in close contact with a person diagnosed with COVID-19 in the last 2 weeks.   . Tell the health care staff about your symptoms: fever, cough and shortness of breath. . After you have been seen by a medical provider, you will be either: o Tested for (COVID-19) and discharged home on quarantine except to seek medical care if symptoms worsen, and asked to  - Stay home and avoid contact with others until you get your results (4-5 days)  - Avoid travel on public transportation if possible (such as bus, train, or airplane) or o Sent to the Emergency Department by EMS for evaluation, COVID-19 testing, and possible  admission depending on your condition and test results.  What to do if you are LOW RISK for COVID-19?  Reduce your risk of any infection by using the same precautions used for avoiding the common cold or flu:  . Wash your hands often with soap and warm water for at least 20 seconds.  If soap and water are not readily available, use an alcohol-based hand sanitizer with at least 60% alcohol.  . If coughing or sneezing, cover your mouth and nose by coughing or sneezing into the elbow areas of your shirt or coat, into a tissue or into your sleeve (not your hands). . Avoid shaking hands with others and consider head nods or verbal greetings only. . Avoid touching your eyes, nose, or mouth with unwashed hands.  . Avoid close contact with people who are sick. . Avoid places or events with large numbers of people in one location, like concerts or sporting events. . Carefully consider travel plans you have or are making. . If you are planning any travel outside or inside the [place], visit the CDC's Travelers' Health webpage for the latest health notices. . If you have some symptoms but not all symptoms, continue to monitor at home and seek medical attention if your symptoms worsen. . If you are having a medical emergency, call 911.   ADDITIONAL HEALTHCARE OPTIONS FOR PATIENTS  [facility] / e-Visit: [email or web address]         [facility]: [phone]  Onset   Urgent Care: [facility]: [facility] Discharge Instructions for Patients Receiving Chemotherapy  Today you received the following chemotherapy agents Kadcyla  To help prevent nausea and vomiting after your treatment, we encourage you to take your nausea medication as directed.   If you develop nausea and vomiting that is not controlled by your nausea medication, call the clinic.   BELOW ARE  SYMPTOMS THAT SHOULD BE REPORTED IMMEDIATELY:  *FEVER GREATER THAN 100.5 F  *CHILLS WITH OR WITHOUT FEVER  NAUSEA AND VOMITING THAT IS NOT CONTROLLED WITH YOUR NAUSEA MEDICATION  *UNUSUAL SHORTNESS OF BREATH  *UNUSUAL BRUISING OR BLEEDING  TENDERNESS IN MOUTH AND THROAT WITH OR WITHOUT PRESENCE OF ULCERS  *URINARY PROBLEMS  *BOWEL PROBLEMS  UNUSUAL RASH Items with * indicate a potential emergency and should be followed up as soon as possible.  Feel free to call the clinic should you have any questions or concerns. The clinic phone number is [phone].  Please show the [facility] at check-in to the Emergency Department and triage nurse.

## 2019-04-16 ENCOUNTER — Telehealth: Payer: Self-pay | Admitting: Radiation Oncology

## 2019-04-16 ENCOUNTER — Ambulatory Visit: Payer: Medicare Other | Admitting: Physical Therapy

## 2019-04-16 ENCOUNTER — Other Ambulatory Visit: Payer: Self-pay

## 2019-04-16 ENCOUNTER — Encounter: Payer: Self-pay | Admitting: Physical Therapy

## 2019-04-16 DIAGNOSIS — R600 Localized edema: Secondary | ICD-10-CM

## 2019-04-16 DIAGNOSIS — Z170 Estrogen receptor positive status [ER+]: Secondary | ICD-10-CM | POA: Diagnosis not present

## 2019-04-16 DIAGNOSIS — M25611 Stiffness of right shoulder, not elsewhere classified: Secondary | ICD-10-CM | POA: Diagnosis not present

## 2019-04-16 DIAGNOSIS — R293 Abnormal posture: Secondary | ICD-10-CM | POA: Diagnosis not present

## 2019-04-16 DIAGNOSIS — M25612 Stiffness of left shoulder, not elsewhere classified: Secondary | ICD-10-CM

## 2019-04-16 DIAGNOSIS — C50412 Malignant neoplasm of upper-outer quadrant of left female breast: Secondary | ICD-10-CM | POA: Diagnosis not present

## 2019-04-16 DIAGNOSIS — Z483 Aftercare following surgery for neoplasm: Secondary | ICD-10-CM

## 2019-04-16 NOTE — Therapy (Signed)
[place], [place], [address] Phone: [phone]   Fax:  [phone]  Physical Therapy Treatment  Patient Details  Name: Katie [name] MRN: [id number] Date of Birth: 02-Feb-1946 Referring Provider (PT): Dr. [name]   Encounter Date: 04/16/2019  PT End of Session - 04/16/19 1217    Visit Number  4    Number of Visits  8    Date for PT Re-Evaluation  04/26/19    PT Start Time  1130    PT Stop Time  1213    PT Time Calculation (min)  43 min    Activity Tolerance  Patient tolerated treatment well    Behavior During Therapy  [facility] for tasks assessed/performed       Past Medical History:  Diagnosis Date  . Allergy   . Anemia   . 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 Blood transfusion without reported diagnosis   . Cancer ([place])    breast 08/2018  . Diverticulitis   . DJD (degenerative joint disease)   . DM type 2 (diabetes mellitus, type 2) ([place])   . Family history of breast cancer   . Fibrocystic breast disease   . GERD (gastroesophageal reflux disease)   . History of chicken pox   . Hypercholesteremia   . Hypertension   . Lichen planopilaris   . Urinary incontinence     Past Surgical History:  Procedure Laterality Date  . BREAST IMPLANT REMOVAL Bilateral 03/08/2019   Procedure: Removal of Bilateral Breast Implants;  Surgeon: Rolm Bookbinder, MD;  Location: [place];  Service: General;  Laterality: Bilateral;  . BREAST SURGERY  02/1975   Left Breast Biopsy-Fibrocystic Disease  . BREAST SURGERY  10/1981   Bialteral Capsulectomy Breast Surgery  . BREAST SURGERY  07/1998   Replace Bilateral Silicone Implants  . BREAST SURGERY  01/1976   Bilater breast surgery to remove fibrocystic tissue and replace with silicone implants  . CHOLECYSTECTOMY N/A 07/08/2016   Procedure: LAPAROSCOPIC CHOLECYSTECTOMY;  Surgeon: [name], MD;  Location: [facility];  Service:  General;  Laterality: N/A;  . COLONOSCOPY WITH PROPOFOL N/A 11/07/2017   Procedure: COLONOSCOPY WITH PROPOFOL;  Surgeon: Jonathon Bellows, MD;  [facility];  Service: Gastroenterology;  Laterality: N/A;  . DILATION AND CURETTAGE OF UTERUS  01/1994  . ESOPHAGOGASTRODUODENOSCOPY (EGD) WITH PROPOFOL N/A 12/05/2016   Procedure: ESOPHAGOGASTRODUODENOSCOPY (EGD) WITH PROPOFOL;  Surgeon: Jonathon Bellows, MD;  Location: [facility] ENDOSCOPY;  Service: Endoscopy;  Laterality: N/A;  . FINGER SURGERY  2001 & 2003   Trigger Finger  . FOOT SURGERY  1980   To Relieve Pinched Nerve  . FOOT SURGERY  07/2008   Left Foot-Plantar Fasciitis  . JOINT REPLACEMENT  04/19/2013   Hip Replacement-Right  . MASTECTOMY W/ SENTINEL NODE BIOPSY Bilateral 03/08/2019   Procedure: BILATERAL MASTECTOMIES WITH LEFT AXILLARY SENTINEL LYMPH NODE BIOPSY AND BLUE DYE INJECTION;  Surgeon: Rolm Bookbinder, MD;  Location: [place];  Service: General;  Laterality: Bilateral;  . PLACEMENT OF BREAST IMPLANTS  12/26/2004   Replace Failed Implants  . RHINOPLASTY  03/1964   To Correct Deviated Septum    There were no vitals filed for this visit.  Subjective Assessment - 04/16/19 1132    Subjective  I got my seroma drained and it is a lot flatter now.    Pertinent History  Bilateral mastectomies with left SLNB 03/08/2019.    Patient Stated Goals  See how my  arm is doing    Currently in Pain?  No/denies    Pain Score  0-No pain         [facility] PT Assessment - 04/16/19 0001      AROM   Right Shoulder Flexion  160 Degrees    Right Shoulder ABduction  175 Degrees    Left Shoulder Flexion  170 Degrees    Left Shoulder ABduction  172 Degrees           Quick Dash - 04/16/19 0001    Open a tight or new jar  Mild difficulty    Do heavy household chores (wash walls, wash floors)  Mild difficulty    Carry a shopping bag or briefcase  No difficulty    Wash your back  No difficulty    Use a knife to cut food  No difficulty     Recreational activities in which you take some force or impact through your arm, shoulder, or hand (golf, hammering, tennis)  No difficulty    During the past week, to what extent has your arm, shoulder or hand problem interfered with your normal social activities with family, friends, neighbors, or groups?  Not at all    During the past week, to what extent has your arm, shoulder or hand problem limited your work or other regular daily activities  Not at all    Arm, shoulder, or hand pain.  Mild    Tingling (pins and needles) in your arm, shoulder, or hand  None    Difficulty Sleeping  No difficulty    DASH Score  6.82 %             [facility] Adult PT Treatment/Exercise - 04/16/19 0001      Shoulder Exercises: Supine   Horizontal ABduction  Strengthening;Both;10 reps;Theraband   pt returned therapist demo   Theraband Level (Shoulder Horizontal ABduction)  Level 2 (Red)    External Rotation  Strengthening;Both;10 reps;Theraband   pt returned therapist demo   Theraband Level (Shoulder External Rotation)  Level 2 (Red)    Flexion  Strengthening;Both;10 reps;Theraband   narrow and wide grip, pt returned therapist demo   Theraband Level (Shoulder Flexion)  Level 2 (Red)    Diagonals  Strengthening;Both;10 reps;Theraband   pt returned therapist demo   Theraband Level (Shoulder Diagonals)  Level 2 (Red)      Shoulder Exercises: Pulleys   Flexion  2 minutes    ABduction  2 minutes      Shoulder Exercises: Therapy Ball   Flexion  10 reps      Manual Therapy   Manual Therapy  Edema management;Passive ROM    Edema Management  created foam chip pack for pt to wear over area where seroma was drained    Passive ROM  in supine to bilateral shoulders in direction of flexion, abduction and ER                  PT Long Term Goals - 04/16/19 1205      PT LONG TERM GOAL #1   Title  Patient will be able to perform home exercise program safely and correctly including the Strength  After Breast Cancer Program.    Time  4    Period  Weeks    Status  On-going      PT LONG TERM GOAL #2   Title  Patient will increase bilateral shoulder flexion to >/= 140 degrees for increased ease reaching overhead.  Baseline  Rt 119, left 123, 04/16/19- R 160 L 170    Time  4    Period  Weeks    Status  Achieved      PT LONG TERM GOAL #3   Title  Patient will increase bilateral abduction to >/= 150 degrees for increased ease reaching and using a drive-through,    Baseline  135 on right, 140 on left, 04/16/19- R 175 L 172    Time  4    Period  Weeks    Status  Achieved      PT LONG TERM GOAL #4   Title  Improve DASH score to </= 7 for increased arm function.    Baseline  15.91, 04/16/19- 6.87    Time  4    Period  Weeks    Status  Achieved            Plan - 04/16/19 1219    Clinical Impression Statement  Assessed pt's goals in therapy She demonstrates full ROM at this time. Upgrade supine scap exericses to red band today. Pt had seroma drained so created foam chip pack for pt wear in bra to added compression to this area. Pt is ready to be instructed in Strength ABC program at next session.    Rehab Potential  Excellent    PT Frequency  2x / week    PT Duration  4 weeks    PT Treatment/Interventions  ADLs/Self Care Home Management;Manual techniques;Therapeutic exercise;Patient/family education;Passive range of motion;Scar mobilization;Manual lymph drainage    PT Next Visit Plan  instruct in strength ABC program    PT Home Exercise Plan  Post op shoulder ROM HEP, supine scap series    Consulted and Agree with Plan of Care  Patient       Patient will benefit from skilled therapeutic intervention in order to improve the following deficits and impairments:  Impaired UE functional use, Pain, Postural dysfunction, Decreased range of motion, Decreased knowledge of precautions, Increased edema, Decreased scar mobility, Increased fascial restricitons  Visit  Diagnosis: Stiffness of right shoulder, not elsewhere classified  Stiffness of left shoulder, not elsewhere classified  Aftercare following surgery for neoplasm  Abnormal posture  Localized edema     Problem List Patient Active Problem List   Diagnosis Date Noted  . Breast cancer, left breast ([place]) 03/08/2019  . 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 Lightheaded 01/05/2019  . Urine WBC increased 01/05/2019  . Diarrhea 10/15/2018  . Dehydration 10/15/2018  . Anemia due to chemotherapy 10/15/2018  . AKI (acute kidney injury) ([place]) 10/09/2018  . Port-A-Cath in place 09/29/2018  . Diabetic retinopathy ([place]) 09/29/2018  . Genetic testing 09/23/2018  . Family history of leukemia 09/16/2018  . Family history of breast cancer   . Malignant neoplasm of upper-outer quadrant of left breast in female, estrogen receptor positive ([place]) 09/11/2018  . Mastitis 08/28/2018  . H/O bilateral breast implants 08/28/2018  . Family history of breast cancer in sister 08/28/2018  . Pre-op examination 02/17/2018  . Diverticular disease 10/17/2017  . Obesity (BMI 30-39.9) 09/02/2017  . Primary osteoarthritis of right knee 03/07/2017  . 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 Vitamin D deficiency 10/11/2016  . Family history of thyroid disorder 04/04/2015  . Osteopenia 10/04/2014  . Counseling regarding end of life decision making 10/04/2014  . 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 History of duodenal ulcer 08/02/2014  . Seasonal allergies 08/02/2014  . Essential hypertension, benign 08/02/2014  .  High cholesterol 08/02/2014  . 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 History of joint replacement 03/01/2014  . Controlled type 2 diabetes with retinopathy ([place]) 11/07/2009    Allyson Sabal [facility] 04/16/2019, 12:21 PM  [place], [place], [address] Phone: [phone]   Fax:   [phone]  Name: Katie [name] MRN: [id number] Date of Birth: April 24, 1946  Manus Gunning, PT 04/16/19 12:21 PM

## 2019-04-16 NOTE — Telephone Encounter (Signed)
New message: ° ° °LVM for patient to return call to schedule appt from referral received. °

## 2019-04-16 NOTE — Progress Notes (Signed)
Location of Breast Cancer: Left Breast  Histology per Pathology Report:  09/07/18 Diagnosis Breast, left, needle core biopsy, 1.4 cm mass at 2 o'clock, 2 cm fn - INVASIVE DUCTAL CARCINOMA, SEE COMMENT.  Receptor Status: ER(95%), PR (40%), Her2-neu (POS), Ki-(50%)  09/14/18 Diagnosis 1. Breast, left, needle core biopsy, 9 o'clock, 5cmfn - FOCAL INVASIVE DUCTAL CARCINOMA, SEE COMMENT. Receptor Status: ER- 100%, PR- 10% Her 2- NEG, Ki- 40%  2. Breast, left, needle core biopsy, 6 o'clock, 4cmfn - INVASIVE DUCTAL CARCINOMA, SEE COMMENT Receptor Status: ER- 90% PR- 30%, Her 2- NEG, Ki- 20%   03/08/19 Diagnosis 1. Breast, simple mastectomy, right - INTRADUCTAL PAPILLOMA. - FIBROCYSTIC CHANGES. - FOREIGN BODY GIANT CELL REACTION. - THERE IS NO EVIDENCE OF MALIGNANCY. 2. Breast, implant only, right - GROSS DIAGNOSIS ONLY: RIGHT BREAST IMPLANT. 3. Breast, simple mastectomy, left - INVASIVE DUCTAL CARCINOMA, GRADE II/III, SCATTERED OVER A SPAN OF 7.5 CM. - THE SURGICAL RESECTION MARGINS ARE NEGATIVE FOR CARCINOMA. - SEE ONCOLOGY TABLE BELOW. Receptor status: ER- 95%, PR- 5% Her2- NEG 4. Breast, implant only, left - GROSS DIAGNOSIS ONLY: LEFT BREAST IMPLANT.  Did patient present with symptoms or was this found on screening mammography?: She presented with left breast tenderness and pain. She proceeded to [facility], ultrasound, and MRI of bilateral breasts.   Past/Anticipated interventions by surgeon, if any: 03/08/19 Procedure: 1. Right risk reducing mastectomy 2. Left deep axillary sentinel node biopsy 3. Injection blue dye for sentinel node identification 4. Left mastectomy 5. Removal bilateral implants.  Surgeon: Dr Serita Grammes   Past/Anticipated interventions by medical oncology, if any:  04/14/19 Dr. Lindi Adie ASSESSMENT & PLAN:  Malignant neoplasm of upper-outer quadrant of left breast in female, estrogen receptor positive ([place]) With prior history of breast implants that  were ruptured/replaced, patient had cellulitis LIQ left breast for 1 week, and UOQ asymmetry 1.5 cm, MRI right breast biopsy benign, left breast numerous masses 7 cm skin thickening: 2 biopsies from left breast: Grade 2-3 IDC ER PR positive, HER-2 positive, Ki-67 50% Clinical stage: T4 N0 M0 stage IIIb  Treatment plan 1. Neoadjuvant chemotherapy with [place] Perjeta 6 cyclescompleted 5/6/2020followed by Herceptin/ Perjeta vs Kadcylamaintenance for 1 year 2. Followed byBilmastectomieswith sentinel lymph node study 03/08/2019 3. Followed by adjuvant radiation therapy 4.Followed by adjuvant antiestrogen therapy ------------------------------------------------------------------------------------------------------------------------------------ 03/08/2019: Bilateral mastectomies: Left mastectomy: Grade 2 IDC spanning 7.5 cm, margins negative, right mastectomy: Benign, ER 95%, PR 5%, HER-2 1+ negative pathology lost the lymph node excision.   Treatment plan: Kadcyla maintenance every 3 weeks Toxicities: No major side effects. Market improvement in the hemoglobin levels today's hemoglobin is 11.4  Radiation will start soon. Antiestrogen therapy after radiation is complete Return to clinic every 3 weeks with labs and MD visit and follow-up for Kadcyla.  Lymphedema issues, if any:  She has swelling to her surgery site. She did have a seroma drained last week at Dr. Cristal Generous office.   Pain issues, if any:  She reports pain at her surgical site, but it is improving slowly.   SAFETY ISSUES:  Prior radiation? No  Pacemaker/ICD? No  Possible current pregnancy? No  Is the patient on methotrexate? No  Current Complaints / other details:       Gurjit Loconte, Stephani Police, RN 04/16/2019,3:20 PM

## 2019-04-19 ENCOUNTER — Other Ambulatory Visit: Payer: Self-pay

## 2019-04-19 ENCOUNTER — Ambulatory Visit: Payer: Medicare Other

## 2019-04-19 DIAGNOSIS — C50412 Malignant neoplasm of upper-outer quadrant of left female breast: Secondary | ICD-10-CM | POA: Diagnosis not present

## 2019-04-19 DIAGNOSIS — Z483 Aftercare following surgery for neoplasm: Secondary | ICD-10-CM

## 2019-04-19 DIAGNOSIS — R293 Abnormal posture: Secondary | ICD-10-CM | POA: Diagnosis not present

## 2019-04-19 DIAGNOSIS — M25611 Stiffness of right shoulder, not elsewhere classified: Secondary | ICD-10-CM | POA: Diagnosis not present

## 2019-04-19 DIAGNOSIS — M25612 Stiffness of left shoulder, not elsewhere classified: Secondary | ICD-10-CM | POA: Diagnosis not present

## 2019-04-19 DIAGNOSIS — Z170 Estrogen receptor positive status [ER+]: Secondary | ICD-10-CM | POA: Diagnosis not present

## 2019-04-19 DIAGNOSIS — R600 Localized edema: Secondary | ICD-10-CM

## 2019-04-19 NOTE — Therapy (Signed)
[place], [place], [address] Phone: [phone]   Fax:  [phone]  Physical Therapy Treatment  Patient Details  Name: Katie [name] MRN: [id number] Date of Birth: 1946/01/02 Referring Provider (PT): Dr. [name]   Encounter Date: 04/19/2019  PT End of Session - 04/19/19 1405    Visit Number  5    Number of Visits  8    Date for PT Re-Evaluation  04/26/19    PT Start Time  [id number]    PT Stop Time  1424    PT Time Calculation (min)  50 min    Activity Tolerance  Patient tolerated treatment well    Behavior During Therapy  [facility] for tasks assessed/performed       Past Medical History:  Diagnosis Date  . Allergy   . Anemia   . 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 Blood transfusion without reported diagnosis   . Cancer ([place])    breast 08/2018  . Diverticulitis   . DJD (degenerative joint disease)   . DM type 2 (diabetes mellitus, type 2) ([place])   . Family history of breast cancer   . Fibrocystic breast disease   . GERD (gastroesophageal reflux disease)   . History of chicken pox   . Hypercholesteremia   . Hypertension   . Lichen planopilaris   . Urinary incontinence     Past Surgical History:  Procedure Laterality Date  . BREAST IMPLANT REMOVAL Bilateral 03/08/2019   Procedure: Removal of Bilateral Breast Implants;  Surgeon: Rolm Bookbinder, MD;  Location: [facility];  Service: General;  Laterality: Bilateral;  . BREAST SURGERY  02/1975   Left Breast Biopsy-Fibrocystic Disease  . BREAST SURGERY  10/1981   Bialteral Capsulectomy Breast Surgery  . BREAST SURGERY  07/1998   Replace Bilateral Silicone Implants  . BREAST SURGERY  01/1976   Bilater breast surgery to remove fibrocystic tissue and replace with silicone implants  . CHOLECYSTECTOMY N/A 07/08/2016   Procedure: LAPAROSCOPIC CHOLECYSTECTOMY;  Surgeon: [name], MD;  Location: [facility];  Service:  General;  Laterality: N/A;  . COLONOSCOPY WITH PROPOFOL N/A 11/07/2017   Procedure: COLONOSCOPY WITH PROPOFOL;  Surgeon: Jonathon Bellows, MD;  Location: [facility] ENDOSCOPY;  Service: Gastroenterology;  Laterality: N/A;  . DILATION AND CURETTAGE OF UTERUS  01/1994  . ESOPHAGOGASTRODUODENOSCOPY (EGD) WITH PROPOFOL N/A 12/05/2016   Procedure: ESOPHAGOGASTRODUODENOSCOPY (EGD) WITH PROPOFOL;  Surgeon: Jonathon Bellows, MD;  Location: [facility] ENDOSCOPY;  Service: Endoscopy;  Laterality: N/A;  . FINGER SURGERY  2001 & 2003   Trigger Finger  . FOOT SURGERY  1980   To Relieve Pinched Nerve  . FOOT SURGERY  07/2008   Left Foot-Plantar Fasciitis  . JOINT REPLACEMENT  04/19/2013   Hip Replacement-Right  . MASTECTOMY W/ SENTINEL NODE BIOPSY Bilateral 03/08/2019   Procedure: BILATERAL MASTECTOMIES WITH LEFT AXILLARY SENTINEL LYMPH NODE BIOPSY AND BLUE DYE INJECTION;  Surgeon: Rolm Bookbinder, MD;  Location: [facility];  Service: General;  Laterality: Bilateral;  . PLACEMENT OF BREAST IMPLANTS  12/26/2004   Replace Failed Implants  . RHINOPLASTY  03/1964   To Correct Deviated Septum    There were no vitals filed for this visit.  Subjective Assessment - 04/19/19 1336    Subjective  The seroma area still seems flat. It might be getting a little bigger, but not bad. I see Dr. [name] again this week for that. I was a bit nasueous over the weekend from  my last infusion but not bad.    Pertinent History  Bilateral mastectomies with left SLNB 03/08/2019.    Patient Stated Goals  See how my arm is doing    Currently in Pain?  No/denies                       [facility] Adult PT Treatment/Exercise - 04/19/19 0001      Self-Care   Self-Care  Other Self-Care Comments    Other Self-Care Comments   Educated pt in "low and slow" progression of exercises and answered pts questions regarding lymphedema risk reduction with ex      Exercises   Exercises  Other Exercises    Other Exercises   Instructed pt in Strength ABC  Program performing all stretches 1x with 10 sec holds and then strengthening up to and including chest press (replaced crunch for S/L clam and modified SuperWoman in quadruped for placing hands on low chair to get trunk parallel to ground)                  PT Long Term Goals - 04/16/19 1205      PT LONG TERM GOAL #1   Title  Patient will be able to perform home exercise program safely and correctly including the Strength After Breast Cancer Program.    Time  4    Period  Weeks    Status  On-going      PT LONG TERM GOAL #2   Title  Patient will increase bilateral shoulder flexion to >/= 140 degrees for increased ease reaching overhead.    Baseline  Rt 119, left 123, 04/16/19- R 160 L 170    Time  4    Period  Weeks    Status  Achieved      PT LONG TERM GOAL #3   Title  Patient will increase bilateral abduction to >/= 150 degrees for increased ease reaching and using a drive-through,    Baseline  135 on right, 140 on left, 04/16/19- R 175 L 172    Time  4    Period  Weeks    Status  Achieved      PT LONG TERM GOAL #4   Title  Improve DASH score to </= 7 for increased arm function.    Baseline  15.91, 04/16/19- 6.87    Time  4    Period  Weeks    Status  Achieved            Plan - 04/19/19 1425    Clinical Impression Statement  Began instructing pt in Strength ABC Program and she tolerated this very well. Did modify a few exercises to better suit pt due to knee replacement last year (see flowsheet).    Stability/Clinical Decision Making  Stable/Uncomplicated    Rehab Potential  Excellent    PT Frequency  2x / week    PT Duration  4 weeks    PT Treatment/Interventions  ADLs/Self Care Home Management;Manual techniques;Therapeutic exercise;Patient/family education;Passive range of motion;Scar mobilization;Manual lymph drainage    PT Next Visit Plan  Complete instruction of Strength ABC Program, should bring her packet back.    PT Home Exercise Plan  Post op shoulder  ROM HEP, supine scap series    Consulted and Agree with Plan of Care  Patient       Patient will benefit from skilled therapeutic intervention in order to improve the following deficits and impairments:  Impaired UE  functional use, Pain, Postural dysfunction, Decreased range of motion, Decreased knowledge of precautions, Increased edema, Decreased scar mobility, Increased fascial restricitons  Visit Diagnosis: Stiffness of right shoulder, not elsewhere classified  Stiffness of left shoulder, not elsewhere classified  Aftercare following surgery for neoplasm  Abnormal posture  Localized edema     Problem List Patient Active Problem List   Diagnosis Date Noted  . Breast cancer, left breast ([place]) 03/08/2019  . 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 Lightheaded 01/05/2019  . Urine WBC increased 01/05/2019  . Diarrhea 10/15/2018  . Dehydration 10/15/2018  . Anemia due to chemotherapy 10/15/2018  . AKI (acute kidney injury) ([place]) 10/09/2018  . Port-A-Cath in place 09/29/2018  . Diabetic retinopathy ([place]) 09/29/2018  . Genetic testing 09/23/2018  . Family history of leukemia 09/16/2018  . Family history of breast cancer   . Malignant neoplasm of upper-outer quadrant of left breast in female, estrogen receptor positive ([name]) 09/11/2018  . Mastitis 08/28/2018  . H/O bilateral breast implants 08/28/2018  . Family history of breast cancer in sister 08/28/2018  . Pre-op examination 02/17/2018  . Diverticular disease 10/17/2017  . Obesity (BMI 30-39.9) 09/02/2017  . Primary osteoarthritis of right knee 03/07/2017  . 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 Vitamin D deficiency 10/11/2016  . Family history of thyroid disorder 04/04/2015  . Osteopenia 10/04/2014  . Counseling regarding end of life decision making 10/04/2014  . 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 History of duodenal ulcer 08/02/2014  . Seasonal allergies 08/02/2014  . Essential  hypertension, benign 08/02/2014  . High cholesterol 08/02/2014  . 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 History of joint replacement 03/01/2014  . Controlled type 2 diabetes with retinopathy ([place]) 11/07/2009    Otelia Limes, PTA 04/19/2019, 2:27 PM  [place], [place], [address] Phone: [phone]   Fax:  [phone]  Name: Katie [name] MRN: [id number] Date of Birth: 1945-11-26

## 2019-04-19 NOTE — Progress Notes (Signed)
Radiation Oncology         [phone] ________________________________  Name: Katie [name] MRN: [id number]  Date: 04/20/2019  DOB: 01-25-1946  Follow-Up Visit Note by WebEx due to pandemic precautions  Outpatient  CC: [name], MD  [name], MD  Diagnosis:      ICD-10-CM   1. Malignant neoplasm of upper-outer quadrant of left breast in female, estrogen receptor positive ([place])  C50.412    Z17.0      Cancer Staging Malignant neoplasm of upper-outer quadrant of left breast in female, estrogen receptor positive ([place]) Staging form: Breast, AJCC 8th Edition - Clinical stage from 09/16/2018: Stage IIIB (cT4, cN0, cM0, G3, ER+, PR+, HER2+) - Signed by [name], MD on 09/16/2018 - Pathologic stage from 03/08/2019: No Stage Recommended (ypT3, pNX, cM0, G2, ER+, PR+, HER2-) - Signed by [name], MD on 04/21/2019  CHIEF COMPLAINT: Here to discuss management of left breast cancer  Narrative:  The patient returns today for follow-up to discuss radiation treatment options. She was seen in the multidisciplinary breast clinic on 09/16/2018.  Since consultation date, she underwent staging scans on 09/28/2018. Chest/abdomen/pelvis CT showed no lymphadenopathy or other findings highly suspicious of metastatic disease; indeterminate right liver lobe lesion; solitary tiny left upper lobe solid pulmonary nodule. Bone scan showed: uncertain uptake at the mid tibia; additional scattered likely degenerative type uptake without definite additional sites of osseous metastatic disease. Follow up liver MRI on 10/13/2018 showed a complex but not appreciably enhancing lesion along the gallbladder fossa, likely a complex cystic lesion given the lack of appreciable enhancement.  She was treated with systemic therapy by Dr. Lindi Adie: [facility] every 3 weeks, 09/30/2018 - 01/13/2019.  She underwent breast MRI on 01/19/2019 revealing: significant interval improvement.    She was placed on maintenance herceptin  from 02/10/2019 until 03/23/2019 when she was switched kadcyla.  She opted to proceed with bilateral mastectomies (and implant removal) on date of 03/08/2019 with pathology report revealing: Left breast tumor size of 7.5 cm; histology of ductal carcinoma; margin status to invasive disease of negative; ER status: 95% positive; PR status 5% positive, Her2 status negative; Grade 2.  Lymph nodes that were removed were lost.  Right breast was benign.  Symptomatically, the patient reports: scars healing well, no drains in place. She had a left seroma, drained clear fluid last week but Dr [name].  She teaches online accounting.        ALLERGIES:  is allergic to erythromycin; ciprofloxacin; daypro [oxaprozin]; enalapril maleate; nsaids; and vioxx [rofecoxib].  Meds: Current Outpatient Medications  Medication Sig Dispense Refill   amLODipine (NORVASC) 5 MG tablet Take 1 tablet (5 mg total) by mouth daily. 30 tablet 11   atorvastatin (LIPITOR) 10 MG tablet TAKE 1 TABLET(10 MG) BY MOUTH DAILY 90 tablet 3   Azelastine HCl 137 MCG/SPRAY SOLN instill 1 spray into each nostril twice a day for 1 week then if needed (Patient taking differently: Place 1 spray into both nostrils daily as needed (congestion). ) 30 mL 5   cholecalciferol (VITAMIN D) 400 units TABS tablet Take 400 Units by mouth 2 (two) times daily.     clobetasol (TEMOVATE) 0.05 % external solution Apply 1 application topically 2 (two) times daily as needed (scalp). Reported on 08/16/2015     fluticasone (FLONASE) 50 MCG/ACT nasal spray INSTILL 2 SPRAYS INTO EACH NOSTRIL ONCE DAILY (Patient taking differently: Place 2 sprays into both nostrils daily. ) 16 g 5   glucose blood test  strip USE TO CHECK BLOOD SUGAR DAILY 50 each 12   lidocaine-prilocaine (EMLA) cream Apply to affected area once 30 g 3   loratadine (CLARITIN) 10 MG tablet Take 10 mg by mouth daily.     metFORMIN (GLUCOPHAGE-XR) 500 MG 24 hr tablet Take 1 tablet (500 mg total)  by mouth daily with breakfast. (Patient taking differently: Take 500 mg by mouth at bedtime. ) 90 tablet 3   metroNIDAZOLE (METROCREAM) 0.75 % cream Apply 1 application topically 2 (two) times daily as needed (rosacea).      Multiple Vitamin (MULTIVITAMIN WITH MINERALS) TABS tablet Take 1 tablet by mouth daily.     triamcinolone cream (KENALOG) 0.1 % Apply 1 application topically 2 (two) times daily. (Patient taking differently: Apply 1 application topically 2 (two) times daily as needed (skin rashes). ) 15 g 0   valACYclovir (VALTREX) 500 MG tablet Take 1 tablet (500 mg total) by mouth 2 (two) times daily. (Patient taking differently: Take 500 mg by mouth 2 (two) times daily as needed (flare up). ) 6 tablet 2   oxyCODONE (OXY IR/ROXICODONE) 5 MG immediate release tablet Take 1 tablet (5 mg total) by mouth every 6 (six) hours as needed for moderate pain. 12 tablet 0   No current facility-administered medications for this encounter.     Physical Findings:  vitals were not taken for this visit. .     General: Alert and oriented, in no acute distress. Psychiatric: Judgment and insight are intact. Affect is appropriate. . Lab Findings: Lab Results  Component Value Date   WBC 4.4 04/14/2019   HGB 11.4 (L) 04/14/2019   HCT 33.8 (L) 04/14/2019   MCV 104.3 (H) 04/14/2019   PLT 143 (L) 04/14/2019    _0 @  Radiographic Findings: No results found.  Impression/Plan: Left breast IDC, s/p bilateral mastectomies  We discussed adjuvant radiotherapy today.  I recommend 6 weeks directed at the left chest wall and regional nodes in order to reduce risk of local regional recurrence by two thirds.  The risks, benefits and side effects of this treatment were discussed in detail.  She understands that radiotherapy is associated with skin irritation and fatigue in the acute setting. Late effects can include cosmetic changes and rare injury to internal organs.  She is enthusiastic about  proceeding with treatment.   We will proceed with simulation later this week. A total of 5 medically necessary complex treatment devices will be fabricated and supervised by me: 4 fields with MLCs for custom blocks to protect heart, and lungs;  and, a Vac-lok. MORE COMPLEX DEVICES MAY BE MADE IN DOSIMETRY FOR FIELD IN FIELD BEAMS FOR DOSE HOMOGENEITY.  I have requested : 3D Simulation which is medically necessary to give adequate dose to at risk tissues while sparing lungs and heart.  I have requested a DVH of the following structures: lungs, heart, esophagus and spinal cord.  The patient will receive 50 Gy in 25 fractions to the left chest wall and regional nodes with 4 fields.  This will be followed by a boost.   This encounter was provided by telemedicine platform Webex.  The patient has given verbal consent for this type of encounter and has been advised to only accept a meeting of this type in a secure network environment. The time spent during this encounter was over 20 minutes. The attendants for this meeting include [name]  and [name].  During the encounter, [name] was located at [facility]  Radiation Oncology Department.  Katie [name] was located at home.   _____________________________________   [name], MD   This document serves as a record of services personally performed by [name], MD. It was created on her behalf by Wilburn Mylar, a trained medical scribe. The creation of this record is based on the scribe's personal observations and the provider's statements to them. This document has been checked and approved by the attending provider.

## 2019-04-20 ENCOUNTER — Other Ambulatory Visit: Payer: Self-pay

## 2019-04-20 ENCOUNTER — Ambulatory Visit
Admission: RE | Admit: 2019-04-20 | Discharge: 2019-04-20 | Disposition: A | Discharge status: Home / Self Care | Payer: Medicare Other | Source: Ambulatory Visit | Attending: Radiation Oncology | Admitting: Radiation Oncology

## 2019-04-20 ENCOUNTER — Encounter: Payer: Self-pay | Admitting: Radiation Oncology

## 2019-04-20 DIAGNOSIS — Z170 Estrogen receptor positive status [ER+]: Secondary | ICD-10-CM

## 2019-04-20 DIAGNOSIS — Z9221 Personal history of antineoplastic chemotherapy: Secondary | ICD-10-CM | POA: Diagnosis not present

## 2019-04-20 DIAGNOSIS — Z9013 Acquired absence of bilateral breasts and nipples: Secondary | ICD-10-CM | POA: Diagnosis not present

## 2019-04-20 DIAGNOSIS — C50412 Malignant neoplasm of upper-outer quadrant of left female breast: Secondary | ICD-10-CM

## 2019-04-21 ENCOUNTER — Encounter: Payer: Self-pay | Admitting: Radiation Oncology

## 2019-04-22 ENCOUNTER — Ambulatory Visit: Payer: Medicare Other | Admitting: Radiation Oncology

## 2019-04-23 ENCOUNTER — Encounter: Payer: Self-pay | Admitting: Rehabilitation

## 2019-04-23 ENCOUNTER — Encounter: Payer: Self-pay | Admitting: Radiation Oncology

## 2019-04-23 ENCOUNTER — Ambulatory Visit: Payer: Medicare Other | Admitting: Rehabilitation

## 2019-04-23 ENCOUNTER — Other Ambulatory Visit: Payer: Self-pay

## 2019-04-23 ENCOUNTER — Ambulatory Visit
Admission: RE | Admit: 2019-04-23 | Discharge: 2019-04-23 | Disposition: A | Discharge status: Home / Self Care | Payer: Medicare Other | Source: Ambulatory Visit | Attending: Radiation Oncology | Admitting: Radiation Oncology

## 2019-04-23 DIAGNOSIS — Z170 Estrogen receptor positive status [ER+]: Secondary | ICD-10-CM | POA: Diagnosis not present

## 2019-04-23 DIAGNOSIS — Z510 Encounter for antineoplastic radiation therapy: Secondary | ICD-10-CM | POA: Diagnosis not present

## 2019-04-23 DIAGNOSIS — Z483 Aftercare following surgery for neoplasm: Secondary | ICD-10-CM

## 2019-04-23 DIAGNOSIS — C50412 Malignant neoplasm of upper-outer quadrant of left female breast: Secondary | ICD-10-CM | POA: Diagnosis not present

## 2019-04-23 DIAGNOSIS — M25612 Stiffness of left shoulder, not elsewhere classified: Secondary | ICD-10-CM

## 2019-04-23 DIAGNOSIS — R293 Abnormal posture: Secondary | ICD-10-CM

## 2019-04-23 DIAGNOSIS — M25611 Stiffness of right shoulder, not elsewhere classified: Secondary | ICD-10-CM

## 2019-04-23 NOTE — Progress Notes (Signed)
Radiation Oncology         [phone] ________________________________  Name: Katie [name] MRN: [id number]  Date: 04/23/2019  DOB: [birth year]   SIMULATION AND TREATMENT PLANNING NOTE // Special treatment procedure Outpatient  DIAGNOSIS:     ICD-10-CM   1. Malignant neoplasm of upper-outer quadrant of left breast in female, estrogen receptor positive ([place])  C50.412    Z17.0     NARRATIVE:  The patient was brought to the [facility].  Identity was confirmed.  All relevant records and images related to the planned course of therapy were reviewed.  The patient freely provided informed written consent to proceed with treatment after reviewing the details related to the planned course of therapy. The consent form was witnessed and verified by the simulation staff.    Then, the patient was set-up in a stable reproducible supine position for radiation therapy with her ipsilateral arm over her head, and her upper body secured in a custom-made Vac-lok device.  CT images were obtained.  Surface markings were placed.  The CT images were loaded into the planning software.    Special treatment procedure:  Special treatment procedure was performed today due to the extra time and effort required by myself to plan and prepare this patient for deep inspiration breath hold technique.  I have determined cardiac sparing to be of benefit to this patient to prevent long term cardiac damage due to radiation of the heart.  Bellows were placed on the patient's abdomen. To facilitate cardiac sparing, the patient was coached by the radiation therapists on breath hold techniques and breathing practice was performed. Practice waveforms were obtained. The patient was then scanned while maintaining breath hold in the treatment position.  This image was then transferred over to the imaging specialist. The imaging specialist then created a fusion of the free breathing and breath hold scans using the chest  wall as the stable structure. I personally reviewed the fusion in axial, coronal and sagittal image planes.  Excellent cardiac sparing was obtained.  I felt the patient is an appropriate candidate for breath hold and the patient will be treated as such.  The image fusion was then reviewed with the patient to reinforce the necessity of reproducible breath hold.  TREATMENT PLANNING NOTE: Treatment planning then occurred.  The radiation prescription was entered and confirmed.     A total of 5 medically necessary complex treatment devices were fabricated and supervised by me: 4 fields with MLCs for custom blocks to protect heart, and lungs;  and, a Vac-lok. MORE COMPLEX DEVICES MAY BE MADE IN DOSIMETRY FOR FIELD IN FIELD BEAMS FOR DOSE HOMOGENEITY.  I have requested : 3D Simulation which is medically necessary to give adequate dose to at risk tissues while sparing lungs and heart.  I have requested a DVH of the following structures: lungs, heart, esophagus, spinal cord.    The patient will receive 50 Gy in 25 fractions to the left chest wall and regional nodes with 4 fields.  This will be followed by a boost.  Optical Surface Tracking Plan:  Since intensity modulated radiotherapy (IMRT) and 3D conformal radiation treatment methods are predicated on accurate and precise positioning for treatment, intrafraction motion monitoring is medically necessary to ensure accurate and safe treatment delivery. The ability to quantify intrafraction motion without excessive ionizing radiation dose can only be performed with optical surface tracking. Accordingly, surface imaging offers the opportunity to obtain 3D measurements of patient position throughout IMRT and 3D  treatments without excessive radiation exposure. I am ordering optical surface tracking for this patient's upcoming course of radiotherapy.  ________________________________   Reference:  Ursula Alert, J, et al. Surface imaging-based analysis  of intrafraction motion for breast radiotherapy patients.Journal of [place], n. 6, nov. 2014. ISSN [id number].  Available at: <http://www.jacmp.org/index.php/jacmp/article/view/4957>.    -----------------------------------  [name], MD

## 2019-04-23 NOTE — Therapy (Signed)
[place], [place], [address] Phone: [phone]   Fax:  [phone]  Physical Therapy Treatment  Patient Details  Name: Katie [name] MRN: [id number] Date of Birth: 1945-08-31 Referring Provider (PT): Dr. Nicholas [name]   Encounter Date: 04/23/2019  PT End of Session - 04/23/19 1046    Visit Number  6    Number of Visits  8    Date for PT Re-Evaluation  04/26/19    PT Start Time  1000    PT Stop Time  1030    PT Time Calculation (min)  30 min    Activity Tolerance  Patient tolerated treatment well    Behavior During Therapy  [facility] for tasks assessed/performed       Past Medical History:  Diagnosis Date  . Allergy   . Anemia   . 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 Blood transfusion without reported diagnosis   . Cancer ([place])    breast 08/2018  . Diverticulitis   . DJD (degenerative joint disease)   . DM type 2 (diabetes mellitus, type 2) ([place])   . Family history of breast cancer   . Fibrocystic breast disease   . GERD (gastroesophageal reflux disease)   . History of chicken pox   . Hypercholesteremia   . Hypertension   . Lichen planopilaris   . Urinary incontinence     Past Surgical History:  Procedure Laterality Date  . BREAST IMPLANT REMOVAL Bilateral 03/08/2019   Procedure: Removal of Bilateral Breast Implants;  Surgeon: Katie Bookbinder, MD;  Location: [facility];  Service: General;  Laterality: Bilateral;  . BREAST SURGERY  02/1975   Left Breast Biopsy-Fibrocystic Disease  . BREAST SURGERY  10/1981   Bialteral Capsulectomy Breast Surgery  . BREAST SURGERY  07/1998   Replace Bilateral Silicone Implants  . BREAST SURGERY  01/1976   B[place]ater breast surgery to remove fibrocystic tissue and replace with silicone implants  . CHOLECYSTECTOMY N/A 07/08/2016   Procedure: LAPAROSCOPIC CHOLECYSTECTOMY;  Surgeon: Katie Pert, MD;  Location: [facility];  Service:  General;  Laterality: N/A;  . COLONOSCOPY WITH PROPOFOL N/A 11/07/2017   Procedure: COLONOSCOPY WITH PROPOFOL;  Surgeon: Katie Bellows, MD;  Location[facility];  Service: Gastroenterology;  Laterality: N/A;  . DILATION AND CURETTAGE OF UTERUS  01/1994  . ESOPHAGOGASTRODUODENOSCOPY (EGD) WITH PROPOFOL N/A 12/05/2016   Procedure: ESOPHAGOGASTRODUODENOSCOPY (EGD) WITH PROPOFOL;  Surgeon: Katie Bellows, MD;  Location: [facility] ENDOSCOPY;  Service: Endoscopy;  Laterality: N/A;  . FINGER SURGERY  2001 & 2003   Trigger Finger  . FOOT SURGERY  1980   To Relieve Pinched Nerve  . FOOT SURGERY  07/2008   Left Foot-Plantar Fasciitis  . JOINT REPLACEMENT  04/19/2013   Hip Replacement-Right  . MASTECTOMY W/ SENTINEL NODE BIOPSY Bilateral 03/08/2019   Procedure: BILATERAL MASTECTOMIES WITH LEFT AXILLARY SENTINEL LYMPH NODE BIOPSY AND BLUE DYE INJECTION;  Surgeon: Katie Bookbinder, MD;  Location: [facility];  Service: General;  Laterality: Bilateral;  . PLACEMENT OF BREAST IMPLANTS  12/26/2004   Replace Failed Implants  . REPLACEMENT TOTAL KNEE Left    2019-  . RHINOPLASTY  03/1964   To Correct Deviated Septum    There were no vitals filed for this visit.  Subjective Assessment - 04/23/19 1002    Subjective  I had my radiation simulation this morning and it was comfortable.  i forgot my sheet    Pertinent History  Bilateral mastectomies  with left SLNB 03/08/2019.    Currently in Pain?  No/denies                       Katie [name] [facility] Adult PT Treatment/Exercise - 04/23/19 0001      Exercises   Other Exercises   gave pt an easier to read copy of strength ABC and completed the rest of the instruction each exercise done with 2# x 10 removed dead lifts                  PT Long Term Goals - 04/23/19 1004      PT LONG TERM GOAL #1   Title  Patient will be able to perform home exercise program safely and correctly including the Strength After Breast Cancer Program.    Status  Achieved       PT LONG TERM GOAL #2   Title  Patient will increase bilateral shoulder flexion to >/= 140 degrees for increased ease reaching overhead.    Status  Achieved      PT LONG TERM GOAL #3   Title  Patient will increase bilateral abduction to >/= 150 degrees for increased ease reaching and using a drive-through,    Status  Achieved      PT LONG TERM GOAL #4   Title  Improve DASH score to </= 7 for increased arm function.    Status  Achieved            Plan - 04/23/19 1046    Clinical Impression Statement  completed strength ABC program.  Pt has met all goals and did well with all instruction.  Pt would like to come 1 more time to see if she has any questions with strength ABC    PT Frequency  2x / week    PT Duration  4 weeks    PT Treatment/Interventions  ADLs/Self Care Home Management;Manual techniques;Therapeutic exercise;Patient/family education;Passive range of motion;Scar mobilization;Manual lymph drainage    PT Next Visit Plan  perform strength ABC 1 more time    Consulted and Agree with Plan of Care  Patient       Patient will benefit from skilled therapeutic intervention in order to improve the following deficits and impairments:     Visit Diagnosis: Stiffness of right shoulder, not elsewhere classified  Stiffness of left shoulder, not elsewhere classified  Aftercare following surgery for neoplasm  Abnormal posture     Problem List Patient Active Problem List   Diagnosis Date Noted  . Breast cancer, left breast ([place]) 03/08/2019  . 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 Lightheaded 01/05/2019  . Urine WBC increased 01/05/2019  . Diarrhea 10/15/2018  . Dehydration 10/15/2018  . Anemia due to chemotherapy 10/15/2018  . AKI (acute kidney injury) ([place]) 10/09/2018  . Port-A-Cath in place 09/29/2018  . Diabetic retinopathy ([place]) 09/29/2018  . Genetic testing 09/23/2018  . Family history of leukemia 09/16/2018  . Family history of breast cancer   . Malignant  neoplasm of upper-outer quadrant of left breast in female, estrogen receptor positive ([place]) 09/11/2018  . Mastitis 08/28/2018  . H/O bilateral breast implants 08/28/2018  . Family history of breast cancer in sister 08/28/2018  . Pre-op examination 02/17/2018  . Diverticular disease 10/17/2017  . Obesity (BMI 30-39.9) 09/02/2017  . Primary osteoarthritis of right knee 03/07/2017  . 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 Vitamin D deficiency 10/11/2016  . Family history of thyroid disorder 04/04/2015  .  Osteopenia 10/04/2014  . Counseling regarding end of life decision making 10/04/2014  . 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 History of duodenal ulcer 08/02/2014  . Seasonal allergies 08/02/2014  . Essential hypertension, benign 08/02/2014  . High cholesterol 08/02/2014  . 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 History of joint replacement 03/01/2014  . Controlled type 2 diabetes with retinopathy ([place]) 11/07/2009    Stark Bray 04/23/2019, 10:48 AM  [place], [place], [address] Phone: [phone]   Fax:  [phone]  Name: Katie [name] MRN: [id number] Date of Birth: 1946/04/19

## 2019-04-26 ENCOUNTER — Other Ambulatory Visit: Payer: Self-pay

## 2019-04-26 ENCOUNTER — Encounter: Payer: Self-pay | Admitting: Rehabilitation

## 2019-04-26 ENCOUNTER — Ambulatory Visit: Payer: Medicare Other | Admitting: Rehabilitation

## 2019-04-26 DIAGNOSIS — M25611 Stiffness of right shoulder, not elsewhere classified: Secondary | ICD-10-CM

## 2019-04-26 DIAGNOSIS — R293 Abnormal posture: Secondary | ICD-10-CM

## 2019-04-26 DIAGNOSIS — Z170 Estrogen receptor positive status [ER+]: Secondary | ICD-10-CM | POA: Diagnosis not present

## 2019-04-26 DIAGNOSIS — M25612 Stiffness of left shoulder, not elsewhere classified: Secondary | ICD-10-CM

## 2019-04-26 DIAGNOSIS — Z483 Aftercare following surgery for neoplasm: Secondary | ICD-10-CM

## 2019-04-26 DIAGNOSIS — C50412 Malignant neoplasm of upper-outer quadrant of left female breast: Secondary | ICD-10-CM | POA: Diagnosis not present

## 2019-04-26 NOTE — Therapy (Signed)
[place], [place], [address] Phone: [phone]   Fax:  [phone]  Physical Therapy Treatment  Patient Details  Name: Katie [name] MRN: [id number] Date of Birth: 07-Nov-1945 Referring Provider (PT): Dr. [name]   Encounter Date: 04/26/2019  PT End of Session - 04/26/19 1443    Visit Number  7    Number of Visits  8    Date for PT Re-Evaluation  04/26/19    PT Start Time  1400    PT Stop Time  1430    PT Time Calculation (min)  30 min    Activity Tolerance  Patient tolerated treatment well    Behavior During Therapy  [facility] for tasks assessed/performed       Past Medical History:  Diagnosis Date  . Allergy   . Anemia   . 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 Blood transfusion without reported diagnosis   . Cancer ([place])    breast 08/2018  . Diverticulitis   . DJD (degenerative joint disease)   . DM type 2 (diabetes mellitus, type 2) ([place])   . Family history of breast cancer   . Fibrocystic breast disease   . GERD (gastroesophageal reflux disease)   . History of chicken pox   . Hypercholesteremia   . Hypertension   . Lichen planopilaris   . Urinary incontinence     Past Surgical History:  Procedure Laterality Date  . BREAST IMPLANT REMOVAL Bilateral 03/08/2019   Procedure: Removal of Bilateral Breast Implants;  Surgeon: Rolm Bookbinder, MD;  Location: [facility];  Service: General;  Laterality: Bilateral;  . BREAST SURGERY  02/1975   Left Breast Biopsy-Fibrocystic Disease  . BREAST SURGERY  10/1981   Bialteral Capsulectomy Breast Surgery  . BREAST SURGERY  07/1998   Replace Bilateral Silicone Implants  . BREAST SURGERY  01/1976   [place] breast surgery to remove fibrocystic tissue and replace with silicone implants  . CHOLECYSTECTOMY N/A 07/08/2016   Procedure: LAPAROSCOPIC CHOLECYSTECTOMY;  Surgeon: [name], MD;  Location: [facility];  Service:  General;  Laterality: N/A;  . COLONOSCOPY WITH PROPOFOL N/A 11/07/2017   Procedure: COLONOSCOPY WITH PROPOFOL;  Surgeon: Jonathon Bellows, MD;  Location: [facility];  Service: Gastroenterology;  Laterality: N/A;  . DILATION AND CURETTAGE OF UTERUS  01/1994  . ESOPHAGOGASTRODUODENOSCOPY (EGD) WITH PROPOFOL N/A 12/05/2016   Procedure: ESOPHAGOGASTRODUODENOSCOPY (EGD) WITH PROPOFOL;  Surgeon: Jonathon Bellows, MD;  Location: [facility] ENDOSCOPY;  Service: Endoscopy;  Laterality: N/A;  . FINGER SURGERY  2001 & 2003   Trigger Finger  . FOOT SURGERY  1980   To Relieve Pinched Nerve  . FOOT SURGERY  07/2008   Left Foot-Plantar Fasciitis  . JOINT REPLACEMENT  04/19/2013   Hip Replacement-Right  . MASTECTOMY W/ SENTINEL NODE BIOPSY Bilateral 03/08/2019   Procedure: BILATERAL MASTECTOMIES WITH LEFT AXILLARY SENTINEL LYMPH NODE BIOPSY AND BLUE DYE INJECTION;  Surgeon: Rolm Bookbinder, MD;  Location: [facility];  Service: General;  Laterality: Bilateral;  . PLACEMENT OF BREAST IMPLANTS  12/26/2004   Replace Failed Implants  . REPLACEMENT TOTAL KNEE Left    2019-  . RHINOPLASTY  03/1964   To Correct Deviated Septum    There were no vitals filed for this visit.  Subjective Assessment - 04/26/19 1359    Subjective  I start radiation next week. I just have a few questions    Pertinent History  Bilateral mastectomies with left SLNB 03/08/2019.  Currently in Pain?  No/denies                       [facility] Adult PT Treatment/Exercise - 04/26/19 0001      Exercises   Other Exercises   re did each exercise from the Strength ABC program.  Only with minor questions and performing all well.  Gave pt handout for figure 4.   Pt with good understanding of all exercises and performed each one well.                    PT Long Term Goals - 04/26/19 1444      PT LONG TERM GOAL #1   Title  Patient will be able to perform home exercise program safely and correctly including the Strength After Breast  Cancer Program.    Status  Achieved      PT LONG TERM GOAL #2   Title  Patient will increase bilateral shoulder flexion to >/= 140 degrees for increased ease reaching overhead.    Status  Achieved      PT LONG TERM GOAL #3   Title  Patient will increase bilateral abduction to >/= 150 degrees for increased ease reaching and using a drive-through,    Status  Achieved      PT LONG TERM GOAL #4   Title  Improve DASH score to </= 7 for increased arm function.    Status  Achieved            Plan - 04/26/19 1444    Clinical Impression Statement  ready for DC with all goals met.  Answered final questions on strength ABC program.    PT Frequency  2x / week    PT Duration  4 weeks    PT Treatment/Interventions  ADLs/Self Care Home Management;Manual techniques;Therapeutic exercise;Patient/family education;Passive range of motion;Scar mobilization;Manual lymph drainage    Consulted and Agree with Plan of Care  Patient       Patient will benefit from skilled therapeutic intervention in order to improve the following deficits and impairments:     Visit Diagnosis: Stiffness of right shoulder, not elsewhere classified  Stiffness of left shoulder, not elsewhere classified  Aftercare following surgery for neoplasm  Abnormal posture     Problem List Patient Active Problem List   Diagnosis Date Noted  . Breast cancer, left breast ([place]) 03/08/2019  . 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 Lightheaded 01/05/2019  . Urine WBC increased 01/05/2019  . Diarrhea 10/15/2018  . Dehydration 10/15/2018  . Anemia due to chemotherapy 10/15/2018  . AKI (acute kidney injury) ([place]) 10/09/2018  . Port-A-Cath in place 09/29/2018  . Diabetic retinopathy ([place]) 09/29/2018  . Genetic testing 09/23/2018  . Family history of leukemia 09/16/2018  . Family history of breast cancer   . Malignant neoplasm of upper-outer quadrant of left breast in female, estrogen receptor positive ([place]) 09/11/2018   . Mastitis 08/28/2018  . H/O bilateral breast implants 08/28/2018  . Family history of breast cancer in sister 08/28/2018  . Pre-op examination 02/17/2018  . Diverticular disease 10/17/2017  . Obesity (BMI 30-39.9) 09/02/2017  . Primary osteoarthritis of right knee 03/07/2017  . 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 Vitamin D deficiency 10/11/2016  . Family history of thyroid disorder 04/04/2015  . Osteopenia 10/04/2014  . Counseling regarding end of life decision making 10/04/2014  . 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 History of duodenal ulcer 08/02/2014  .  Seasonal allergies 08/02/2014  . Essential hypertension, benign 08/02/2014  . High cholesterol 08/02/2014  . 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 History of joint replacement 03/01/2014  . Controlled type 2 diabetes with retinopathy ([place]) 11/07/2009    Stark Bray 04/26/2019, 2:45 PM  [place], [place], [address] Phone: [phone]   Fax:  [phone]  Name: Katie [name] MRN: [id number] Date of Birth: 1946/02/15  PHYSICAL THERAPY DISCHARGE SUMMARY  Visits from Start of Care: 7  Current functional level related to goals / functional outcomes: All goals met.  Pt ready for radiation and home exercise   Remaining deficits: HEP   Education / Equipment: HEP Plan: Patient agrees to discharge.  Patient goals were met. Patient is being discharged due to meeting the stated rehab goals.  ?????    Shan Levans, PT

## 2019-04-27 ENCOUNTER — Other Ambulatory Visit (INDEPENDENT_AMBULATORY_CARE_PROVIDER_SITE_OTHER): Payer: Medicare Other

## 2019-04-27 ENCOUNTER — Telehealth: Payer: Self-pay | Admitting: Family Medicine

## 2019-04-27 DIAGNOSIS — E113293 Type 2 diabetes mellitus with mild nonproliferative diabetic retinopathy without macular edema, bilateral: Secondary | ICD-10-CM | POA: Diagnosis not present

## 2019-04-27 DIAGNOSIS — E559 Vitamin D deficiency, unspecified: Secondary | ICD-10-CM | POA: Diagnosis not present

## 2019-04-27 DIAGNOSIS — E7800 Pure hypercholesterolemia, unspecified: Secondary | ICD-10-CM

## 2019-04-27 LAB — COMPREHENSIVE METABOLIC PANEL
ALT: 35 U/L (ref 0–35)
AST: 35 U/L (ref 0–37)
Albumin: 4.1 g/dL (ref 3.5–5.2)
Alkaline Phosphatase: 74 U/L (ref 39–117)
BUN: 16 mg/dL (ref 6–23)
CO2: 31 mEq/L (ref 19–32)
Calcium: 9.6 mg/dL (ref 8.4–10.5)
Chloride: 101 mEq/L (ref 96–112)
Creatinine, Ser: 0.76 mg/dL (ref 0.40–1.20)
GFR: 74.31 mL/min (ref >60.00)
Glucose, Bld: 136 mg/dL — ABNORMAL HIGH (ref 70–99)
Potassium: 3.6 mEq/L (ref 3.5–5.1)
Sodium: 141 mEq/L (ref 135–145)
Total Bilirubin: 0.5 mg/dL (ref 0.2–1.2)
Total Protein: 7 g/dL (ref 6.0–8.3)

## 2019-04-27 LAB — LIPID PANEL
Cholesterol: 164 mg/dL (ref 0–200)
HDL: 37.6 mg/dL — ABNORMAL LOW (ref >39.00)
NonHDL: 126.04
Total CHOL/HDL Ratio: 4
Triglycerides: 304 mg/dL — ABNORMAL HIGH (ref 0.0–149.0)
VLDL: 60.8 mg/dL — ABNORMAL HIGH (ref 0.0–40.0)

## 2019-04-27 LAB — VITAMIN D 25 HYDROXY (VIT D DEFICIENCY, FRACTURES): VITD: 31.67 ng/mL (ref 30.00–100.00)

## 2019-04-27 LAB — HEMOGLOBIN A1C: Hgb A1c MFr Bld: 5.2 % (ref 4.6–6.5)

## 2019-04-27 LAB — LDL CHOLESTEROL, DIRECT: Direct LDL: 77 mg/dL

## 2019-04-27 NOTE — Progress Notes (Signed)
No critical labs need to be addressed urgently. We will discuss labs in detail at upcoming office visit.   

## 2019-04-27 NOTE — Telephone Encounter (Signed)
-----   Message from Ellamae Sia sent at 04/27/2019  7:53 AM EDT ----- Regarding: Lab orders for now Patient is scheduled for CPX labs, please order future labs, Thanks , Karna Christmas

## 2019-04-28 DIAGNOSIS — Z510 Encounter for antineoplastic radiation therapy: Secondary | ICD-10-CM | POA: Insufficient documentation

## 2019-04-28 DIAGNOSIS — Z170 Estrogen receptor positive status [ER+]: Secondary | ICD-10-CM | POA: Diagnosis not present

## 2019-04-28 DIAGNOSIS — C50412 Malignant neoplasm of upper-outer quadrant of left female breast: Secondary | ICD-10-CM | POA: Insufficient documentation

## 2019-04-30 ENCOUNTER — Ambulatory Visit (INDEPENDENT_AMBULATORY_CARE_PROVIDER_SITE_OTHER): Payer: Medicare Other | Admitting: Family Medicine

## 2019-04-30 ENCOUNTER — Ambulatory Visit
Admission: RE | Admit: 2019-04-30 | Discharge: 2019-04-30 | Disposition: A | Discharge status: Home / Self Care | Payer: Medicare Other | Source: Ambulatory Visit | Attending: Radiation Oncology | Admitting: Radiation Oncology

## 2019-04-30 ENCOUNTER — Encounter: Payer: Self-pay | Admitting: Family Medicine

## 2019-04-30 ENCOUNTER — Other Ambulatory Visit: Payer: Self-pay

## 2019-04-30 VITALS — BP 170/72 | HR 89 | Temp 98.2°F | Ht 62.0 in | Wt 147.8 lb

## 2019-04-30 DIAGNOSIS — I720 Aneurysm of carotid artery: Secondary | ICD-10-CM | POA: Diagnosis not present

## 2019-04-30 DIAGNOSIS — C50412 Malignant neoplasm of upper-outer quadrant of left female breast: Secondary | ICD-10-CM

## 2019-04-30 DIAGNOSIS — E7800 Pure hypercholesterolemia, unspecified: Secondary | ICD-10-CM | POA: Diagnosis not present

## 2019-04-30 DIAGNOSIS — E113293 Type 2 diabetes mellitus with mild nonproliferative diabetic retinopathy without macular edema, bilateral: Secondary | ICD-10-CM | POA: Diagnosis not present

## 2019-04-30 DIAGNOSIS — M810 Age-related osteoporosis without current pathological fracture: Secondary | ICD-10-CM | POA: Diagnosis not present

## 2019-04-30 DIAGNOSIS — R1013 Epigastric pain: Secondary | ICD-10-CM

## 2019-04-30 DIAGNOSIS — Z23 Encounter for immunization: Secondary | ICD-10-CM | POA: Diagnosis not present

## 2019-04-30 DIAGNOSIS — E559 Vitamin D deficiency, unspecified: Secondary | ICD-10-CM | POA: Diagnosis not present

## 2019-04-30 DIAGNOSIS — Z0000 Encounter for general adult medical examination without abnormal findings: Secondary | ICD-10-CM

## 2019-04-30 DIAGNOSIS — I10 Essential (primary) hypertension: Secondary | ICD-10-CM

## 2019-04-30 DIAGNOSIS — Z170 Estrogen receptor positive status [ER+]: Secondary | ICD-10-CM | POA: Diagnosis not present

## 2019-04-30 DIAGNOSIS — Z510 Encounter for antineoplastic radiation therapy: Secondary | ICD-10-CM | POA: Diagnosis not present

## 2019-04-30 LAB — HM DIABETES FOOT EXAM

## 2019-04-30 NOTE — Assessment & Plan Note (Signed)
Elevated at last few MD visits. She has been using a lot of pedialyte... stop and decrease salt. Increase water.  Follow BP at home.. if > 140/90... will increase amlodipine to 10 mg daily.

## 2019-04-30 NOTE — Progress Notes (Signed)
Chief Complaint  Patient presents with  . Annual Exam    Part 2    History of Present Illness: HPI  The patient presents for complete physical and review of chronic health problems. He/She also has the following acute concerns today: New firmness at umbilicus and soreness in last week,intermittant, started after meal of pointo beans.. caused bloating. No associated  D,C, N/V  Last BM nml this AM. 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 not currently taking PPI.  The patient saw a LPN or RN for medicare wellness visit.  Prevention and wellness was reviewed in detail. Note reviewed and important notes copied below. Health maintenance: Foot exam - postponed Microalbumin - postponed Abnormal screenings:  None  04/30/19    Currently being treated for left breast cancer. ONC Dr. Lindi Adie She is currently on adjuvant treatment with Kadcyla maintenance and radiation ( starts 6 weeks next week).  Plan antiestrogen therapy afterward.  Going through PT.   Diabetes: Good control on metformin  Lab Results  Component Value Date   HGBA1C 5.2 04/27/2019  Using medications without difficulties: Hypoglycemic episodes:none Hyperglycemic episodes:none Feet problems: no ulcers Blood Sugars averaging: FBS 108-125 eye exam within last year: yes Wt Readings from Last 3 Encounters:  04/30/19 147 lb 12 oz (67 kg)  04/14/19 150 lb 9.6 oz (68.3 kg)  03/24/19 150 lb 12.8 oz (68.4 kg)   Elevated Cholesterol:  At goal on atorvastatin. Lab Results  Component Value Date   CHOL 164 04/27/2019   HDL 37.60 (L) 04/27/2019   LDLCALC 25 01/06/2019   LDLDIRECT 77.0 04/27/2019   TRIG 304.0 (H) 04/27/2019   CHOLHDL 4 04/27/2019  Using medications without problems: Muscle aches:  Diet compliance: moderate given chemo Exercise: walking and PT. Other complaints:   Hypertension:   Elevated in office today.. on amlodipine 5 mg daily.   BP Readings from Last 3 Encounters:  04/30/19 (!) 170/72   04/14/19 (!) 162/59  03/24/19 (!) 140/55  Using medication without problems or lightheadedness: none Chest pain with exertion:none Edema:none Short of breath:none Average home BPs: 140/56 Other issues:  Patient Care Team: [name], MD as PCP - General (Family Medicine) Birder [name], MD as Referring Physician (Ophthalmology) [name], MD as Consulting Physician (Dermatology) [name], MD (Inactive) as Consulting Physician (Orthopedic Surgery) [name], MD as Consulting Physician (General Surgery) [name], MD as Consulting Physician (Dentistry) [name], MD as Consulting Physician (General Surgery) [name], MD as Consulting Physician (Hematology and Oncology) [name], MD as Attending Physician (Radiation Oncology)  COVID 19 screen No recent travel or known exposure to [place] The patient denies respiratory symptoms of COVID 19 at this time.  The importance of social distancing was discussed today.   Review of Systems  Constitutional: Negative for chills and fever.  HENT: Negative for congestion and ear pain.   Eyes: Negative for pain and redness.  Respiratory: Negative for cough and shortness of breath.   Cardiovascular: Negative for chest pain, palpitations and leg swelling.  Gastrointestinal: Negative for abdominal pain, blood in stool, constipation, diarrhea, nausea and vomiting.  Genitourinary: Negative for dysuria.  Musculoskeletal: Negative for falls and myalgias.  Skin: Negative for rash.  Neurological: Negative for dizziness.  Psychiatric/Behavioral: Negative for depression. The patient is not nervous/anxious.       Past Medical History:  Diagnosis Date  . Allergy   . Anemia   . 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 Blood  transfusion without reported diagnosis   . Cancer ([place])    breast 08/2018  . Diverticulitis   . DJD (degenerative joint disease)   . DM type 2 (diabetes  mellitus, type 2) ([place])   . Family history of breast cancer   . Fibrocystic breast disease   . GERD (gastroesophageal reflux disease)   . History of chicken pox   . Hypercholesteremia   . Hypertension   . Lichen planopilaris   . Urinary incontinence     reports that she has never smoked. She has never used smokeless tobacco. She reports previous alcohol use. She reports that she does not use drugs.   Current Outpatient Medications:  .  amLODipine (NORVASC) 5 MG tablet, Take 1 tablet (5 mg total) by mouth daily., Disp: 30 tablet, Rfl: 11 .  atorvastatin (LIPITOR) 10 MG tablet, TAKE 1 TABLET(10 MG) BY MOUTH DAILY, Disp: 90 tablet, Rfl: 3 .  Azelastine HCl 137 MCG/SPRAY SOLN, Place 1 spray into the nose daily as needed., Disp: , Rfl:  .  cholecalciferol (VITAMIN D) 400 units TABS tablet, Take 400 Units by mouth 2 (two) times daily., Disp: , Rfl:  .  clobetasol (TEMOVATE) 0.05 % external solution, Apply 1 application topically 2 (two) times daily as needed (scalp). Reported on 08/16/2015, Disp: , Rfl:  .  fluticasone (FLONASE) 50 MCG/ACT nasal spray, INSTILL 2 SPRAYS INTO EACH NOSTRIL ONCE DAILY, Disp: 16 g, Rfl: 5 .  glucose blood test strip, USE TO CHECK BLOOD SUGAR DAILY, Disp: 50 each, Rfl: 12 .  lidocaine-prilocaine (EMLA) cream, Apply to affected area once, Disp: 30 g, Rfl: 3 .  loratadine (CLARITIN) 10 MG tablet, Take 10 mg by mouth daily., Disp: , Rfl:  .  metFORMIN (GLUCOPHAGE) 500 MG tablet, Take 500 mg by mouth at bedtime., Disp: , Rfl:  .  metroNIDAZOLE (METROCREAM) 0.75 % cream, Apply 1 application topically 2 (two) times daily as needed (rosacea). , Disp: , Rfl:  .  Multiple Vitamin (MULTIVITAMIN WITH MINERALS) TABS tablet, Take 1 tablet by mouth daily., Disp: , Rfl:  .  triamcinolone cream (KENALOG) 0.1 %, Apply 1 application topically 2 (two) times daily as needed., Disp: , Rfl:  .  valACYclovir (VALTREX) 500 MG tablet, Take 500 mg by mouth 2 (two) times daily as needed., Disp:  , Rfl:    Observations/Objective: Blood pressure (!) 170/72, pulse 89, temperature 98.2 F (36.8 C), temperature source Temporal, height 5\' 2"  (1.575 m), weight 147 lb 12 oz (67 kg), last menstrual period 08/26/1992, SpO2 96 %.  Physical Exam Constitutional:      General: She is not in acute distress.    Appearance: Normal appearance. She is well-developed. She is not ill-appearing or toxic-appearing.  HENT:     Head: Normocephalic.     Right Ear: Hearing, tympanic membrane, ear canal and external ear normal.     Left Ear: Hearing, tympanic membrane, ear canal and external ear normal.     Nose: Nose normal.  Eyes:     General: Lids are normal. Lids are everted, no foreign bodies appreciated.     Conjunctiva/sclera: Conjunctivae normal.     Pupils: Pupils are equal, round, and reactive to light.  Neck:     Musculoskeletal: Normal range of motion and neck supple.     Thyroid: No thyroid mass or thyromegaly.     Vascular: No carotid bruit.     Trachea: Trachea normal.  Cardiovascular:     Rate and Rhythm: Normal rate and regular  rhythm.     Heart sounds: Normal heart sounds, S1 normal and S2 normal. No murmur. No gallop.   Pulmonary:     Effort: Pulmonary effort is normal. No respiratory distress.     Breath sounds: Normal breath sounds. No wheezing, rhonchi or rales.  Abdominal:     General: Bowel sounds are normal. There is no distension or abdominal bruit.     Palpations: Abdomen is soft. There is no fluid wave or mass.     Tenderness: There is abdominal tenderness in the epigastric area. There is no guarding or rebound.     Hernia: No hernia is present.     Comments: Slightly more firmness in ventral abdomen.. no clear mass or hernia noted   Lymphadenopathy:     Cervical: No cervical adenopathy.  Skin:    General: Skin is warm and dry.     Findings: No rash.  Neurological:     Mental Status: She is alert.     Cranial Nerves: No cranial nerve deficit.     Sensory: No  sensory deficit.  Psychiatric:        Mood and Affect: Mood is not anxious or depressed.        Speech: Speech normal.        Behavior: Behavior normal. Behavior is cooperative.        Judgment: Judgment normal.     Diabetic foot exam: Normal inspection No skin breakdown No calluses  Normal DP pulses Normal sensation to light touch and monofilament Nails normal Assessment and Plan   The patient's preventative maintenance and recommended screening tests for an annual wellness exam were reviewed in full today. Brought up to date unless services declined.  Counselled on the importance of diet, exercise, and its role in overall health and mortality. The patient's FH and SH was reviewed, including their home life, tobacco status, and drug and alcohol status.   Last PAP 09/2014 nml, neg HPV, no further indicated. DVE:no family history of ovarian or uterine cancer Mammagrom per ONC , breast cancer in treatment DEXA ( osteoporosis in forearm, nml in other sites,08/2018) plans repeat in 2 years Vaccines:uptodate, high dose flu given Colon: 10/2017 Dr. Vicente Males repeat 5 years. Hep C: neg Nonsmoker  Vitamin D deficiency Resolved.  Osteoporosis of forearm Recommend weight bearing exercise, calcium in diet and vit D supplement 400 IU 1-2 times daily.    Malignant neoplasm of upper-outer quadrant of left breast in female, estrogen receptor positive ([place])  Followed by ONC.[name] starting radiation next week.  High cholesterol Well controlled. Continue current medication.   Epigastric pain Most likely related to gastritis vs recurrence of gastric erosion.  Restart nexium 40 mg x 2-4 weeks.   Essential hypertension, benign  Elevated at last few MD visits. She has been using a lot of pedialyte... stop and decrease salt. Increase water.  Follow BP at home.. if > 140/90... will increase amlodipine to 10 mg daily.  Diabetic retinopathy ([place])  Followed by Opthamology.  Controlled type 2  diabetes with retinopathy ([place]) Well controlled. Continue current medication. No lows but if weight loss continues, may be able to stop metformin.  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 Lacinda Axon.    Eliezer Lofts, MD

## 2019-04-30 NOTE — Assessment & Plan Note (Signed)
Follow up per Dr. Lacinda Axon.

## 2019-04-30 NOTE — Assessment & Plan Note (Signed)
Most likely related to gastritis vs recurrence of gastric erosion.  Restart nexium 40 mg x 2-4 weeks.

## 2019-04-30 NOTE — Assessment & Plan Note (Signed)
Recommend weight bearing exercise, calcium in diet and vit D supplement 400 IU 1-2 times daily.  

## 2019-04-30 NOTE — Assessment & Plan Note (Signed)
Followed by ONC.[name] starting radiation next week.

## 2019-04-30 NOTE — Patient Instructions (Addendum)
Follow BP at home.. call with measurement in 1-2 weeks.  Decrase salt in diet.  Restart Nexium 20 mg 2 capsules daily x 2-4 weeks for epigastric pain.  Call if not improving as expected.

## 2019-04-30 NOTE — Assessment & Plan Note (Signed)
Followed by Opthamology.

## 2019-04-30 NOTE — Assessment & Plan Note (Signed)
Well controlled. Continue current medication.  

## 2019-04-30 NOTE — Assessment & Plan Note (Signed)
Well controlled. Continue current medication. No lows but if weight loss continues, may be able to stop metformin.

## 2019-04-30 NOTE — Assessment & Plan Note (Signed)
Resolved

## 2019-05-04 ENCOUNTER — Ambulatory Visit
Admission: RE | Admit: 2019-05-04 | Discharge: 2019-05-04 | Disposition: A | Discharge status: Home / Self Care | Payer: Medicare Other | Source: Ambulatory Visit | Attending: Radiation Oncology | Admitting: Radiation Oncology

## 2019-05-04 ENCOUNTER — Other Ambulatory Visit: Payer: Self-pay

## 2019-05-04 DIAGNOSIS — Z170 Estrogen receptor positive status [ER+]: Secondary | ICD-10-CM | POA: Diagnosis not present

## 2019-05-04 DIAGNOSIS — Z510 Encounter for antineoplastic radiation therapy: Secondary | ICD-10-CM | POA: Diagnosis not present

## 2019-05-04 DIAGNOSIS — C50412 Malignant neoplasm of upper-outer quadrant of left female breast: Secondary | ICD-10-CM | POA: Diagnosis not present

## 2019-05-04 NOTE — Progress Notes (Signed)
Patient Care Team: [name], MD as PCP - General (Family Medicine) Birder [name], MD as Referring Physician (Ophthalmology) [name], MD as Consulting Physician (Dermatology) [name], MD (Inactive) as Consulting Physician (Orthopedic Surgery) [name], MD as Consulting Physician (General Surgery) [name], MD as Consulting Physician (Dentistry) [name], MD as Consulting Physician (General Surgery) [name], MD as Consulting Physician (Hematology and Oncology) [name], MD as Attending Physician (Radiation Oncology)  DIAGNOSIS:    ICD-10-CM   1. Malignant neoplasm of upper-outer quadrant of left breast in female, estrogen receptor positive ([place])  C50.412    Z17.0     SUMMARY OF ONCOLOGIC HISTORY: Oncology History  Malignant neoplasm of upper-outer quadrant of left breast in female, estrogen receptor positive ([place])  09/14/2018 Initial Diagnosis   With prior history of breast implants that were ruptured/replaced, patient had cellulitis LIQ left breast for 1 week, and UOQ asymmetry 1.5 cm, MRI right breast biopsy benign, left breast numerous masses 7 cm skin thickening: 2 biopsies from left breast: Grade 2-3 IDC ER PR positive, HER-2 positive, Ki-67 50%   09/16/2018 Cancer Staging   Staging form: Breast, AJCC 8th Edition - Clinical stage from 09/16/2018: Stage IIIB (cT4, cN0, cM0, G3, ER+, PR+, HER2+) - Signed by [name], MD on 09/16/2018   09/29/2018 -  Neo-Adjuvant Chemotherapy   TCHP q 3 weeks   01/19/2019 Breast MRI   Significant interval improvement, the multiple left breast masses and areas of non mass enhancement in all 4 quadrants have decreased in size and extent. The mass previously measuring 1.4 cm with diffuse enhancement now measures 9 mm with heterogeneous areas of enhancement. Current greatest transverse dimension is 5.4 x 3.9 cm, previously 7 cm x 5cm. 2. 4 mm masslike focus of enhancement at the base of the  right nipple is unchanged.   02/10/2019 - 03/23/2019 Chemotherapy   The patient had trastuzumab (HERCEPTIN) 450 mg in sodium chloride 0.9 % 250 mL chemo infusion, 420 mg (100 % of original dose 6 mg/kg), Intravenous,  Once, 2 of 5 cycles Dose modification: 6 mg/kg (original dose 6 mg/kg, Cycle 1, Reason: Provider Judgment) Administration: 450 mg (02/10/2019), 450 mg (03/03/2019)  for chemotherapy treatment.    03/08/2019 Surgery   Bilateral mastectomies [name]: right breast: no malignancy; left breast: IDC, grade 2, scattered over 7.5cm, clear margins.    03/08/2019 Cancer Staging   Staging form: Breast, AJCC 8th Edition - Pathologic stage from 03/08/2019: No Stage Recommended (ypT3, pNX, cM0, G2, ER+, PR+, HER2-) - Signed by [name], MD on 04/21/2019   03/24/2019 -  Chemotherapy   The patient had ado-trastuzumab emtansine (KADCYLA) 260 mg in sodium chloride 0.9 % 250 mL chemo infusion, 3.6 mg/kg = 260 mg, Intravenous, Once, 2 of 15 cycles Administration: 260 mg (03/24/2019), 260 mg (04/14/2019)  for chemotherapy treatment.      CHIEF COMPLIANT: Follow-up of Kadcyla maintenance  INTERVAL HISTORY: ALDEA AVIS is a 73 y.o. with above-mentioned history of HER-2 positiveleft breast cancer treated with neoadjuvant chemotherapy followed by bilateral mastectomies. She is currently on adjuvant treatment with Kadcyla maintenance. She presents to the clinic today for treatment.  Adjuvant radiation was started yesterday.  REVIEW OF SYSTEMS:   Constitutional: Denies fevers, chills or abnormal weight loss Eyes: Denies blurriness of vision Ears, nose, mouth, throat, and face: Denies mucositis or sore throat Respiratory: Denies cough, dyspnea or wheezes Cardiovascular: Denies palpitation, chest discomfort Gastrointestinal: Denies nausea, heartburn or change in bowel habits  Skin: Denies abnormal skin rashes Lymphatics: Denies new lymphadenopathy or easy bruising Neurological: Denies numbness,  tingling or new weaknesses Behavioral/Psych: Mood is stable, no new changes  Extremities: No lower extremity edema Breast: denies any pain or lumps or nodules in either breasts All other systems were reviewed with the patient and are negative.  I have reviewed the past medical history, past surgical history, social history and family history with the patient and they are unchanged from previous note.  ALLERGIES:  is allergic to erythromycin; ciprofloxacin; daypro [oxaprozin]; enalapril maleate; nsaids; and vioxx [rofecoxib].  MEDICATIONS:  Current Outpatient Medications  Medication Sig Dispense Refill   amLODipine (NORVASC) 5 MG tablet Take 1 tablet (5 mg total) by mouth daily. 30 tablet 11   atorvastatin (LIPITOR) 10 MG tablet TAKE 1 TABLET(10 MG) BY MOUTH DAILY 90 tablet 3   Azelastine HCl 137 MCG/SPRAY SOLN Place 1 spray into the nose daily as needed.     cholecalciferol (VITAMIN D) 400 units TABS tablet Take 400 Units by mouth 2 (two) times daily.     clobetasol (TEMOVATE) 0.05 % external solution Apply 1 application topically 2 (two) times daily as needed (scalp). Reported on 08/16/2015     fluticasone (FLONASE) 50 MCG/ACT nasal spray INSTILL 2 SPRAYS INTO EACH NOSTRIL ONCE DAILY 16 g 5   glucose blood test strip USE TO CHECK BLOOD SUGAR DAILY 50 each 12   lidocaine-prilocaine (EMLA) cream Apply to affected area once 30 g 3   loratadine (CLARITIN) 10 MG tablet Take 10 mg by mouth daily.     metFORMIN (GLUCOPHAGE) 500 MG tablet Take 500 mg by mouth at bedtime.     metroNIDAZOLE (METROCREAM) 0.75 % cream Apply 1 application topically 2 (two) times daily as needed (rosacea).      Multiple Vitamin (MULTIVITAMIN WITH MINERALS) TABS tablet Take 1 tablet by mouth daily.     triamcinolone cream (KENALOG) 0.1 % Apply 1 application topically 2 (two) times daily as needed.     valACYclovir (VALTREX) 500 MG tablet Take 500 mg by mouth 2 (two) times daily as needed.     No current  facility-administered medications for this visit.     PHYSICAL EXAMINATION: ECOG PERFORMANCE STATUS: 1 - Symptomatic but completely ambulatory  There were no vitals filed for this visit. There were no vitals filed for this visit.  GENERAL: alert, no distress and comfortable SKIN: skin color, texture, turgor are normal, no rashes or significant lesions EYES: normal, Conjunctiva are pink and non-injected, sclera clear OROPHARYNX: no exudate, no erythema and lips, buccal mucosa, and tongue normal  NECK: supple, thyroid normal size, non-tender, without nodularity LYMPH: no palpable lymphadenopathy in the cervical, axillary or inguinal LUNGS: clear to auscultation and percussion with normal breathing effort HEART: regular rate & rhythm and no murmurs and no lower extremity edema ABDOMEN: abdomen soft, non-tender and normal bowel sounds MUSCULOSKELETAL: no cyanosis of digits and no clubbing  NEURO: alert & oriented x 3 with fluent speech, no focal motor/sensory deficits EXTREMITIES: No lower extremity edema  LABORATORY DATA:  I have reviewed the data as listed CMP Latest Ref Rng & Units 04/27/2019 04/14/2019 03/09/2019  Glucose 70 - 99 mg/dL 136(H) 107(H) 147(H)  BUN 6 - 23 mg/dL _0 Creatinine 0.40 - 1.20 mg/dL 0.76 0.80 0.88  Sodium 135 - 145 mEq/L 141 140 136  Potassium 3.5 - 5.1 mEq/L 3.6 3.8 4.6  Chloride 96 - 112 mEq/L 101 105 106  CO2 19 - 32 mEq/L  _0 Calcium 8.4 - 10.5 mg/dL 9.6 9.6 8.8(L)  Total Protein 6.0 - 8.3 g/dL 7.0 7.2 -  Total Bilirubin 0.2 - 1.2 mg/dL 0.5 0.4 -  Alkaline Phos 39 - 117 U/L 74 83 -  AST 0 - 37 U/L 35 27 -  ALT 0 - 35 U/L 35 33 -    Lab Results  Component Value Date   WBC 4.4 04/14/2019   HGB 11.4 (L) 04/14/2019   HCT 33.8 (L) 04/14/2019   MCV 104.3 (H) 04/14/2019   PLT 143 (L) 04/14/2019   NEUTROABS 2.0 04/14/2019    ASSESSMENT & PLAN:  Malignant neoplasm of upper-outer quadrant of left breast in female, estrogen receptor positive  ([place]) With prior history of breast implants that were ruptured/replaced, patient had cellulitis LIQ left breast for 1 week, and UOQ asymmetry 1.5 cm, MRI right breast biopsy benign, left breast numerous masses 7 cm skin thickening: 2 biopsies from left breast: Grade 2-3 IDC ER PR positive, HER-2 positive, Ki-67 50% Clinical stage: T4 N0 M0 stage IIIb  Treatment plan 1. Neoadjuvant chemotherapy with [place] Perjeta 6 cyclescompleted 5/6/2020followed by Herceptin/ Perjeta vs Kadcylamaintenance for 1 year 2. Followed byBilmastectomieswith sentinel lymph node study 03/08/2019 3. Followed by adjuvant radiation therapy started 05/04/2019 4.Followed by adjuvant antiestrogen therapy ------------------------------------------------------------------------------------------------------------------------------------ 03/08/2019: Bilateral mastectomies: Left mastectomy: Grade 2 IDC spanning 7.5 cm, margins negative, right mastectomy: Benign, ER 95%, PR 5%, HER-2 1+ negative pathology lost the lymph node excision.   Treatment plan: Kadcyla maintenance every 3 weeks Toxicities:  1. Nausea 2.  Mild abdominal discomfort: Started on Nexium 3.  Mild thrombocytopenia: Monitoring closely.  Denies any diarrhea. Market improvement in the hemoglobin levels today's hemoglobin is 11.3  Radiation  started 05/05/2019. Antiestrogen therapy after radiation is complete Return to clinic every 3 weeks with labs and MD visit and follow-up for Kadcyla.    No orders of the defined types were placed in this encounter.  The patient has a good understanding of the overall plan. she agrees with it. she will call with any problems that may develop before the next visit here.  [name], MD 05/05/2019  Julious Oka Dorshimer am acting as scribe for Dr. [name].  I have reviewed the above documentation for accuracy and completeness, and I agree with the above.

## 2019-05-05 ENCOUNTER — Ambulatory Visit: Payer: Medicare Other

## 2019-05-05 ENCOUNTER — Ambulatory Visit
Admission: RE | Admit: 2019-05-05 | Discharge: 2019-05-05 | Disposition: A | Discharge status: Home / Self Care | Payer: Medicare Other | Source: Ambulatory Visit | Attending: Radiation Oncology | Admitting: Radiation Oncology

## 2019-05-05 ENCOUNTER — Inpatient Hospital Stay: Payer: Medicare Other

## 2019-05-05 ENCOUNTER — Inpatient Hospital Stay: Payer: Medicare Other | Attending: Hematology and Oncology

## 2019-05-05 ENCOUNTER — Inpatient Hospital Stay (HOSPITAL_BASED_OUTPATIENT_CLINIC_OR_DEPARTMENT_OTHER): Payer: Medicare Other | Admitting: Hematology and Oncology

## 2019-05-05 ENCOUNTER — Other Ambulatory Visit: Payer: Self-pay

## 2019-05-05 DIAGNOSIS — Z5112 Encounter for antineoplastic immunotherapy: Secondary | ICD-10-CM | POA: Insufficient documentation

## 2019-05-05 DIAGNOSIS — E119 Type 2 diabetes mellitus without complications: Secondary | ICD-10-CM | POA: Insufficient documentation

## 2019-05-05 DIAGNOSIS — Z170 Estrogen receptor positive status [ER+]: Secondary | ICD-10-CM

## 2019-05-05 DIAGNOSIS — Z9882 Breast implant status: Secondary | ICD-10-CM | POA: Insufficient documentation

## 2019-05-05 DIAGNOSIS — Z7984 Long term (current) use of oral hypoglycemic drugs: Secondary | ICD-10-CM | POA: Insufficient documentation

## 2019-05-05 DIAGNOSIS — E785 Hyperlipidemia, unspecified: Secondary | ICD-10-CM | POA: Insufficient documentation

## 2019-05-05 DIAGNOSIS — Z79899 Other long term (current) drug therapy: Secondary | ICD-10-CM | POA: Insufficient documentation

## 2019-05-05 DIAGNOSIS — C50412 Malignant neoplasm of upper-outer quadrant of left female breast: Secondary | ICD-10-CM

## 2019-05-05 DIAGNOSIS — I10 Essential (primary) hypertension: Secondary | ICD-10-CM | POA: Insufficient documentation

## 2019-05-05 DIAGNOSIS — D696 Thrombocytopenia, unspecified: Secondary | ICD-10-CM | POA: Diagnosis not present

## 2019-05-05 DIAGNOSIS — Z95828 Presence of other vascular implants and grafts: Secondary | ICD-10-CM

## 2019-05-05 DIAGNOSIS — Z510 Encounter for antineoplastic radiation therapy: Secondary | ICD-10-CM | POA: Diagnosis not present

## 2019-05-05 LAB — CBC WITH DIFFERENTIAL (CANCER CENTER ONLY)
Abs Immature Granulocytes: 0.01 10*3/uL (ref 0.00–0.07)
Basophils Absolute: 0 10*3/uL (ref 0.0–0.1)
Basophils Relative: 1 %
Eosinophils Absolute: 0.2 10*3/uL (ref 0.0–0.5)
Eosinophils Relative: 5 %
HCT: 33.3 % — ABNORMAL LOW (ref 36.0–46.0)
Hemoglobin: 11.3 g/dL — ABNORMAL LOW (ref 12.0–15.0)
Immature Granulocytes: 0 %
Lymphocytes Relative: 39 %
Lymphs Abs: 1.4 10*3/uL (ref 0.7–4.0)
MCH: 34.7 pg — ABNORMAL HIGH (ref 26.0–34.0)
MCHC: 33.9 g/dL (ref 30.0–36.0)
MCV: 102.1 fL — ABNORMAL HIGH (ref 80.0–100.0)
Monocytes Absolute: 0.4 10*3/uL (ref 0.1–1.0)
Monocytes Relative: 10 %
Neutro Abs: 1.7 10*3/uL (ref 1.7–7.7)
Neutrophils Relative %: 45 %
Platelet Count: 102 10*3/uL — ABNORMAL LOW (ref 150–400)
RBC: 3.26 MIL/uL — ABNORMAL LOW (ref 3.87–5.11)
RDW: 13.3 % (ref 11.5–15.5)
WBC Count: 3.7 10*3/uL — ABNORMAL LOW (ref 4.0–10.5)
nRBC: 0 % (ref 0.0–0.2)

## 2019-05-05 LAB — CMP (CANCER CENTER ONLY)
ALT: 27 U/L (ref 0–44)
AST: 27 U/L (ref 15–41)
Albumin: 4 g/dL (ref 3.5–5.0)
Alkaline Phosphatase: 81 U/L (ref 38–126)
Anion gap: 13 (ref 5–15)
BUN: 18 mg/dL (ref 8–23)
CO2: 25 mmol/L (ref 22–32)
Calcium: 9.7 mg/dL (ref 8.9–10.3)
Chloride: 104 mmol/L (ref 98–111)
Creatinine: 0.83 mg/dL (ref 0.44–1.00)
GFR, Est AFR Am: >60 mL/min (ref >60)
GFR, Estimated: >60 mL/min (ref >60)
Glucose, Bld: 108 mg/dL — ABNORMAL HIGH (ref 70–99)
Potassium: 3.8 mmol/L (ref 3.5–5.1)
Sodium: 142 mmol/L (ref 135–145)
Total Bilirubin: 0.4 mg/dL (ref 0.3–1.2)
Total Protein: 7.4 g/dL (ref 6.5–8.1)

## 2019-05-05 MED ORDER — SODIUM CHLORIDE 0.9 % IV SOLN
3.6000 mg/kg | Freq: Once | INTRAVENOUS | Status: AC
  Administered 2019-05-05: 260 mg via INTRAVENOUS
  Filled 2019-05-05: qty 8

## 2019-05-05 MED ORDER — SODIUM CHLORIDE 0.9 % IV SOLN
Freq: Once | INTRAVENOUS | Status: AC
  Administered 2019-05-05: 13:00:00 via INTRAVENOUS
  Filled 2019-05-05: qty 250

## 2019-05-05 MED ORDER — ACETAMINOPHEN 325 MG PO TABS
ORAL_TABLET | ORAL | Status: AC
  Filled 2019-05-05: qty 2

## 2019-05-05 MED ORDER — SODIUM CHLORIDE 0.9% FLUSH
10.0000 mL | Freq: Once | INTRAVENOUS | Status: AC
  Administered 2019-05-05: 10 mL
  Filled 2019-05-05: qty 10

## 2019-05-05 MED ORDER — DIPHENHYDRAMINE HCL 25 MG PO CAPS
50.0000 mg | ORAL_CAPSULE | Freq: Once | ORAL | Status: AC
  Administered 2019-05-05: 50 mg via ORAL

## 2019-05-05 MED ORDER — HEPARIN SOD (PORK) LOCK FLUSH 100 UNIT/ML IV SOLN
500.0000 [IU] | Freq: Once | INTRAVENOUS | Status: AC | PRN
  Administered 2019-05-05: 500 [IU]
  Filled 2019-05-05: qty 5

## 2019-05-05 MED ORDER — DEXAMETHASONE 0.1 % OP SUSP: 1.0000 [drp] | Freq: Two times a day (BID) | OPHTHALMIC | 1 refills | Status: DC

## 2019-05-05 MED ORDER — HEPARIN SOD (PORK) LOCK FLUSH 100 UNIT/ML IV SOLN
500.0000 [IU] | Freq: Once | INTRAVENOUS | Status: DC
  Filled 2019-05-05: qty 5

## 2019-05-05 MED ORDER — SODIUM CHLORIDE 0.9% FLUSH
10.0000 mL | INTRAVENOUS | Status: DC | PRN
  Administered 2019-05-05: 10 mL
  Filled 2019-05-05: qty 10

## 2019-05-05 MED ORDER — DIPHENHYDRAMINE HCL 25 MG PO CAPS
ORAL_CAPSULE | ORAL | Status: AC
  Filled 2019-05-05: qty 2

## 2019-05-05 MED ORDER — ACETAMINOPHEN 325 MG PO TABS
650.0000 mg | ORAL_TABLET | Freq: Once | ORAL | Status: AC
  Administered 2019-05-05: 650 mg via ORAL

## 2019-05-05 NOTE — Assessment & Plan Note (Signed)
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 Neoadjuvant chemotherapy with [place] Perjeta 6 cyclescompleted 5/6/2020followed by Herceptin/ Perjeta vs Kadcylamaintenance for 1 year 2. Followed byBilmastectomieswith sentinel lymph node study 03/08/2019 3. Followed by adjuvant radiation therapy 4.Followed by adjuvant antiestrogen therapy ------------------------------------------------------------------------------------------------------------------------------------ 03/08/2019: Bilateral mastectomies: Left mastectomy: Grade 2 IDC spanning 7.5 cm, margins negative, right mastectomy: Benign, ER 95%, PR 5%, HER-2 1+ negative pathology lost the lymph node excision.   Treatment plan: Kadcyla maintenance every 3 weeks Toxicities: No major side effects. Market improvement in the hemoglobin levels today's hemoglobin is 11.4  Radiation  started 05/05/2019. Antiestrogen therapy after radiation is complete Return to clinic every 3 weeks with labs and MD visit and follow-up for Kadcyla.

## 2019-05-05 NOTE — Patient Instructions (Signed)
[facility] Discharge Instructions for Patients Receiving Chemotherapy  Today you received the following chemotherapy agents Kadcyla (Ado-Trastuzumab Emtansine)  To help prevent nausea and vomiting after your treatment, we encourage you to take your nausea medication as directed. If you develop nausea and vomiting that is not controlled by your nausea medication, call the clinic.   BELOW ARE SYMPTOMS THAT SHOULD BE REPORTED IMMEDIATELY:  *FEVER GREATER THAN 100.5 F  *CHILLS WITH OR WITHOUT FEVER  NAUSEA AND VOMITING THAT IS NOT CONTROLLED WITH YOUR NAUSEA MEDICATION  *UNUSUAL SHORTNESS OF BREATH  *UNUSUAL BRUISING OR BLEEDING  TENDERNESS IN MOUTH AND THROAT WITH OR WITHOUT PRESENCE OF ULCERS  *URINARY PROBLEMS  *BOWEL PROBLEMS  UNUSUAL RASH Items with * indicate a potential emergency and should be followed up as soon as possible.  Feel free to call the clinic should you have any questions or concerns. The clinic phone number is [phone].  Please show the CHEMO ALERT CARD at check-in to the Emergency Department and triage nurse.  Ado-Trastuzumab Emtansine for injection(Kadcyla) What is this medicine? ADO-TRASTUZUMAB EMTANSINE (ADD oh traz TOO zuh mab em TAN zine) is a monoclonal antibody combined with chemotherapy. It is used to treat breast cancer. This medicine may be used for other purposes; ask your health care provider or pharmacist if you have questions. COMMON BRAND NAME(S): Kadcyla What should I tell my health care provider before I take this medicine? They need to know if you have any of these conditions:  heart disease  heart failure  infection (especially a virus infection such as chickenpox, cold sores, or herpes)  liver disease  lung or breathing disease, like asthma  tingling of the fingers or toes, or other nerve disorder  an unusual or allergic reaction to ado-trastuzumab emtansine, other medications, foods, dyes, or  preservatives  pregnant or trying to get pregnant  breast-feeding How should I use this medicine? This medicine is for infusion into a vein. It is given by a health care professional in a hospital or clinic setting. Talk to your pediatrician regarding the use of this medicine in children. Special care may be needed. Overdosage: If you think you have taken too much of this medicine contact a poison control center or emergency room at once. NOTE: This medicine is only for you. Do not share this medicine with others. What if I miss a dose? It is important not to miss your dose. Call your doctor or health care professional if you are unable to keep an appointment. What may interact with this medicine? This medicine may also interact with the following medications:  atazanavir  boceprevir  clarithromycin  delavirdine  indinavir  dalfopristin; quinupristin  isoniazid, INH  itraconazole  ketoconazole  nefazodone  nelfinavir  ritonavir  telaprevir  telithromycin  tipranavir  voriconazole This list may not describe all possible interactions. Give your health care provider a list of all the medicines, herbs, non-prescription drugs, or dietary supplements you use. Also tell them if you smoke, drink alcohol, or use illegal drugs. Some items may interact with your medicine. What should I watch for while using this medicine? Visit your doctor for checks on your progress. This drug may make you feel generally unwell. This is not uncommon, as chemotherapy can affect healthy cells as well as cancer cells. Report any side effects. Continue your course of treatment even though you feel ill unless your doctor tells you to stop. You may need blood work done while you are taking this medicine.   Call your doctor or health care professional for advice if you get a fever, chills or sore throat, or other symptoms of a cold or flu. Do not treat yourself. This drug decreases your body's ability  to fight infections. Try to avoid being around people who are sick. Be careful brushing and flossing your teeth or using a toothpick because you may get an infection or bleed more easily. If you have any dental work done, tell your dentist you are receiving this medicine. Avoid taking products that contain aspirin, acetaminophen, ibuprofen, naproxen, or ketoprofen unless instructed by your doctor. These medicines may hide a fever. Do not become pregnant while taking this medicine or for 7 months after stopping it, men with female partners should use contraception during treatment and for 4 months after the last dose. Women should inform their doctor if they wish to become pregnant or think they might be pregnant. There is a potential for serious side effects to an unborn child. Do not breast-feed an infant while taking this medicine or for 7 months after the last dose. Men who have a partner who is pregnant or who is capable of becoming pregnant should use a condom during sexual activity while taking this medicine and for 4 months after stopping it. Men should inform their doctors if they wish to father a child. This medicine may lower sperm counts. Talk to your health care professional or pharmacist for more information. What side effects may I notice from receiving this medicine? Side effects that you should report to your doctor or health care professional as soon as possible:  allergic reactions like skin rash, itching or hives, swelling of the face, lips, or tongue  breathing problems  chest pain or palpitations  fever or chills, sore throat  general ill feeling or flu-like symptoms  light-colored stools  nausea, vomiting  pain, tingling, numbness in the hands or feet  signs and symptoms of bleeding such as bloody or black, tarry stools; red or dark-brown urine; spitting up blood or brown material that looks like coffee grounds; red spots on the skin; unusual bruising or bleeding from  the eye, gums, or nose  swelling of the legs or ankles  yellowing of the eyes or skin Side effects that usually do not require medical attention (report to your doctor or health care professional if they continue or are bothersome):  changes in taste  constipation  dizziness  headache  joint pain  muscle pain  trouble sleeping  unusually weak or tired This list may not describe all possible side effects. Call your doctor for medical advice about side effects. You may report side effects to FDA at 1-800-FDA-1088. Where should I keep my medicine? This drug is given in a hospital or clinic and will not be stored at home. NOTE: This sheet is a summary. It may not cover all possible information. If you have questions about this medicine, talk to your doctor, pharmacist, or health care provider.  2020 [employer]vier/Gold Standard (2018-01-09 10:03:15)  

## 2019-05-06 ENCOUNTER — Ambulatory Visit
Admission: RE | Admit: 2019-05-06 | Discharge: 2019-05-06 | Disposition: A | Discharge status: Home / Self Care | Payer: Medicare Other | Source: Ambulatory Visit | Attending: Radiation Oncology | Admitting: Radiation Oncology

## 2019-05-06 ENCOUNTER — Other Ambulatory Visit: Payer: Self-pay

## 2019-05-06 DIAGNOSIS — Z170 Estrogen receptor positive status [ER+]: Secondary | ICD-10-CM

## 2019-05-06 DIAGNOSIS — Z510 Encounter for antineoplastic radiation therapy: Secondary | ICD-10-CM | POA: Diagnosis not present

## 2019-05-06 DIAGNOSIS — C50412 Malignant neoplasm of upper-outer quadrant of left female breast: Secondary | ICD-10-CM

## 2019-05-06 MED ORDER — RADIAPLEXRX EX GEL
Freq: Two times a day (BID) | CUTANEOUS | Status: DC
  Administered 2019-05-06: 12:00:00 via TOPICAL

## 2019-05-06 MED ORDER — ALRA NON-METALLIC DEODORANT (RAD-ONC)
1.0000 "application " | Freq: Once | TOPICAL | Status: AC
  Administered 2019-05-06: 1 via TOPICAL

## 2019-05-07 ENCOUNTER — Ambulatory Visit
Admission: RE | Admit: 2019-05-07 | Discharge: 2019-05-07 | Disposition: A | Discharge status: Home / Self Care | Payer: Medicare Other | Source: Ambulatory Visit | Attending: Radiation Oncology | Admitting: Radiation Oncology

## 2019-05-07 ENCOUNTER — Other Ambulatory Visit: Payer: Self-pay

## 2019-05-07 DIAGNOSIS — C50412 Malignant neoplasm of upper-outer quadrant of left female breast: Secondary | ICD-10-CM | POA: Diagnosis not present

## 2019-05-07 DIAGNOSIS — Z510 Encounter for antineoplastic radiation therapy: Secondary | ICD-10-CM | POA: Diagnosis not present

## 2019-05-07 DIAGNOSIS — Z170 Estrogen receptor positive status [ER+]: Secondary | ICD-10-CM | POA: Diagnosis not present

## 2019-05-10 ENCOUNTER — Other Ambulatory Visit: Payer: Self-pay

## 2019-05-10 ENCOUNTER — Ambulatory Visit
Admission: RE | Admit: 2019-05-10 | Discharge: 2019-05-10 | Disposition: A | Discharge status: Home / Self Care | Payer: Medicare Other | Source: Ambulatory Visit | Attending: Radiation Oncology | Admitting: Radiation Oncology

## 2019-05-10 DIAGNOSIS — Z170 Estrogen receptor positive status [ER+]: Secondary | ICD-10-CM | POA: Diagnosis not present

## 2019-05-10 DIAGNOSIS — C50412 Malignant neoplasm of upper-outer quadrant of left female breast: Secondary | ICD-10-CM | POA: Diagnosis not present

## 2019-05-10 DIAGNOSIS — Z510 Encounter for antineoplastic radiation therapy: Secondary | ICD-10-CM | POA: Diagnosis not present

## 2019-05-11 ENCOUNTER — Other Ambulatory Visit: Payer: Self-pay

## 2019-05-11 ENCOUNTER — Ambulatory Visit
Admission: RE | Admit: 2019-05-11 | Discharge: 2019-05-11 | Disposition: A | Discharge status: Home / Self Care | Payer: Medicare Other | Source: Ambulatory Visit | Attending: Radiation Oncology | Admitting: Radiation Oncology

## 2019-05-11 DIAGNOSIS — Z510 Encounter for antineoplastic radiation therapy: Secondary | ICD-10-CM | POA: Diagnosis not present

## 2019-05-11 DIAGNOSIS — C50412 Malignant neoplasm of upper-outer quadrant of left female breast: Secondary | ICD-10-CM | POA: Diagnosis not present

## 2019-05-11 DIAGNOSIS — Z170 Estrogen receptor positive status [ER+]: Secondary | ICD-10-CM | POA: Diagnosis not present

## 2019-05-12 ENCOUNTER — Telehealth: Payer: Self-pay | Admitting: Gastroenterology

## 2019-05-12 ENCOUNTER — Ambulatory Visit
Admission: RE | Admit: 2019-05-12 | Discharge: 2019-05-12 | Disposition: A | Discharge status: Home / Self Care | Payer: Medicare Other | Source: Ambulatory Visit | Attending: Radiation Oncology | Admitting: Radiation Oncology

## 2019-05-12 ENCOUNTER — Other Ambulatory Visit: Payer: Self-pay

## 2019-05-12 DIAGNOSIS — Z170 Estrogen receptor positive status [ER+]: Secondary | ICD-10-CM | POA: Diagnosis not present

## 2019-05-12 DIAGNOSIS — C50412 Malignant neoplasm of upper-outer quadrant of left female breast: Secondary | ICD-10-CM | POA: Diagnosis not present

## 2019-05-12 DIAGNOSIS — Z510 Encounter for antineoplastic radiation therapy: Secondary | ICD-10-CM | POA: Diagnosis not present

## 2019-05-12 NOTE — Telephone Encounter (Signed)
Let me see her next week

## 2019-05-12 NOTE — Telephone Encounter (Signed)
Pt left vm she is currently scheduled for an apt for 06/15/19 she is also on a waitlist pt stomach is hurting her real bad she thinks she may need to go to ER if we can not see her sooner please call pt

## 2019-05-13 ENCOUNTER — Ambulatory Visit
Admission: RE | Admit: 2019-05-13 | Discharge: 2019-05-13 | Disposition: A | Discharge status: Home / Self Care | Payer: Medicare Other | Source: Ambulatory Visit | Attending: Radiation Oncology | Admitting: Radiation Oncology

## 2019-05-13 ENCOUNTER — Other Ambulatory Visit: Payer: Self-pay

## 2019-05-13 DIAGNOSIS — Z510 Encounter for antineoplastic radiation therapy: Secondary | ICD-10-CM | POA: Diagnosis not present

## 2019-05-13 DIAGNOSIS — C50412 Malignant neoplasm of upper-outer quadrant of left female breast: Secondary | ICD-10-CM | POA: Diagnosis not present

## 2019-05-13 DIAGNOSIS — Z170 Estrogen receptor positive status [ER+]: Secondary | ICD-10-CM | POA: Diagnosis not present

## 2019-05-13 NOTE — Telephone Encounter (Signed)
Pt left vm she is expecting a call today and has an apt in [place] please call her cell

## 2019-05-14 ENCOUNTER — Other Ambulatory Visit: Payer: Self-pay | Admitting: Family Medicine

## 2019-05-14 ENCOUNTER — Ambulatory Visit
Admission: RE | Admit: 2019-05-14 | Discharge: 2019-05-14 | Disposition: A | Discharge status: Home / Self Care | Payer: Medicare Other | Source: Ambulatory Visit | Attending: Radiation Oncology | Admitting: Radiation Oncology

## 2019-05-14 ENCOUNTER — Other Ambulatory Visit: Payer: Self-pay

## 2019-05-14 DIAGNOSIS — Z510 Encounter for antineoplastic radiation therapy: Secondary | ICD-10-CM | POA: Diagnosis not present

## 2019-05-14 DIAGNOSIS — Z170 Estrogen receptor positive status [ER+]: Secondary | ICD-10-CM | POA: Diagnosis not present

## 2019-05-14 DIAGNOSIS — C50412 Malignant neoplasm of upper-outer quadrant of left female breast: Secondary | ICD-10-CM | POA: Diagnosis not present

## 2019-05-14 MED ORDER — AMLODIPINE BESYLATE 10 MG PO TABS: 10.0000 mg | ORAL_TABLET | Freq: Every day | ORAL | 3 refills | Status: DC

## 2019-05-17 ENCOUNTER — Ambulatory Visit
Admission: RE | Admit: 2019-05-17 | Discharge: 2019-05-17 | Disposition: A | Discharge status: Home / Self Care | Payer: Medicare Other | Source: Ambulatory Visit | Attending: Radiation Oncology | Admitting: Radiation Oncology

## 2019-05-17 ENCOUNTER — Other Ambulatory Visit: Payer: Self-pay

## 2019-05-17 DIAGNOSIS — Z170 Estrogen receptor positive status [ER+]: Secondary | ICD-10-CM | POA: Diagnosis not present

## 2019-05-17 DIAGNOSIS — Z510 Encounter for antineoplastic radiation therapy: Secondary | ICD-10-CM | POA: Diagnosis not present

## 2019-05-17 DIAGNOSIS — C50412 Malignant neoplasm of upper-outer quadrant of left female breast: Secondary | ICD-10-CM | POA: Diagnosis not present

## 2019-05-18 ENCOUNTER — Ambulatory Visit
Admission: RE | Admit: 2019-05-18 | Discharge: 2019-05-18 | Disposition: A | Discharge status: Home / Self Care | Payer: Medicare Other | Source: Ambulatory Visit | Attending: Radiation Oncology | Admitting: Radiation Oncology

## 2019-05-18 ENCOUNTER — Other Ambulatory Visit: Payer: Self-pay

## 2019-05-18 DIAGNOSIS — C50412 Malignant neoplasm of upper-outer quadrant of left female breast: Secondary | ICD-10-CM | POA: Diagnosis not present

## 2019-05-18 DIAGNOSIS — Z510 Encounter for antineoplastic radiation therapy: Secondary | ICD-10-CM | POA: Diagnosis not present

## 2019-05-18 DIAGNOSIS — Z170 Estrogen receptor positive status [ER+]: Secondary | ICD-10-CM | POA: Diagnosis not present

## 2019-05-19 ENCOUNTER — Ambulatory Visit (INDEPENDENT_AMBULATORY_CARE_PROVIDER_SITE_OTHER): Payer: Medicare Other | Admitting: Gastroenterology

## 2019-05-19 ENCOUNTER — Encounter: Payer: Self-pay | Admitting: Gastroenterology

## 2019-05-19 ENCOUNTER — Ambulatory Visit
Admission: RE | Admit: 2019-05-19 | Discharge: 2019-05-19 | Disposition: A | Discharge status: Home / Self Care | Payer: Medicare Other | Source: Ambulatory Visit | Attending: Radiation Oncology | Admitting: Radiation Oncology

## 2019-05-19 ENCOUNTER — Encounter: Payer: Self-pay | Admitting: Hematology and Oncology

## 2019-05-19 ENCOUNTER — Other Ambulatory Visit: Payer: Self-pay

## 2019-05-19 VITALS — BP 148/78 | HR 91 | Temp 97.9°F | Ht 62.0 in | Wt 146.2 lb

## 2019-05-19 DIAGNOSIS — C50412 Malignant neoplasm of upper-outer quadrant of left female breast: Secondary | ICD-10-CM | POA: Diagnosis not present

## 2019-05-19 DIAGNOSIS — R1011 Right upper quadrant pain: Secondary | ICD-10-CM | POA: Diagnosis not present

## 2019-05-19 DIAGNOSIS — R194 Change in bowel habit: Secondary | ICD-10-CM | POA: Diagnosis not present

## 2019-05-19 DIAGNOSIS — Z510 Encounter for antineoplastic radiation therapy: Secondary | ICD-10-CM | POA: Diagnosis not present

## 2019-05-19 DIAGNOSIS — Z170 Estrogen receptor positive status [ER+]: Secondary | ICD-10-CM | POA: Diagnosis not present

## 2019-05-19 DIAGNOSIS — R935 Abnormal findings on diagnostic imaging of other abdominal regions, including retroperitoneum: Secondary | ICD-10-CM | POA: Diagnosis not present

## 2019-05-19 MED ORDER — DICYCLOMINE HCL 10 MG PO CAPS: 10.0000 mg | ORAL_CAPSULE | Freq: Three times a day (TID) | ORAL | 2 refills | Status: DC

## 2019-05-19 NOTE — Progress Notes (Signed)
Jonathon Bellows MD, MRCP(U.K) [address]  [place]  [place], [place] [address]  Main: [phone]  Fax: [phone]   Primary Care Physician: [name], MD  Primary Gastroenterologist:  Dr. Jonathon Bellows  Follow-up for left lower quadrant pain  HPI: Katie [name] is a 73 y.o. female   Summary of history :  She underwent underwent a cholecystectomy in 06/2017 and was evaluated for right upper quadrant pain. Despite which continued to have the pain.RUQ post prandial , few times a week, bloating , ,localizedSince it was persistent Dr Lorelee Cover to evaluate for peptic ulcers. RUQ USG 10/2016 was normal with only post operative changes. LFT's normal 10/2016 .EGD on 12/05/16 which showed antral gastritis with negative for H pylori which was confirmed on biopsy.  Since 2018 she did not have any abdominal pain.  Interval history 11/03/2017-05/19/2019   Diagnosed in January 2020 with breast cancer.  She called our office last week with right upper quadrant pain bloating and some nausea. 09/28/2018: CT scan of the abdomen: Subcapsular small low-attenuation right liver lobe lesion adjacent to gallbladder fossa indeterminant.  MRI recommended. 10/13/2018 MRI liver with contrast: Complex lesion seen in the gallbladder fossa benign etiology favored but surveillance recommended 11/07/2017: Colonoscopy: 3 polyps resected, diverticulosis of the sigmoid colon.  Tubular adenomas x2.  Today she is here for follow up . No RUQ discomfort since last visit. On a low FODMAP diet which helped. Uses IB guard as needed.   05/05/2019: CBC 2 weeks back demonstrated hemoglobin 11.3 g with an MCV of 102.  2 months back hemoglobin is 8.5 with an MCV 110.  CMP shows normal liver function tests  She says that she is here to see me today for right upper quadrant pain which began about 3 weeks back, sharp in nature on and off generally worse after a meal.  She could not tell me how long after a meal.   The pain can last hours.  Some relief with defecation.  Nonradiating.  Has got a bit better over the last 3 weeks.  She has a bowel movement daily porridge consistency and can be foul-smelling at times.  Denies consistency of diarrhea that would be watery.  She is on metformin.   Current Outpatient Medications  Medication Sig Dispense Refill  . amLODipine (NORVASC) 10 MG tablet Take 1 tablet (10 mg total) by mouth daily. 90 tablet 3  . atorvastatin (LIPITOR) 10 MG tablet TAKE 1 TABLET(10 MG) BY MOUTH DAILY 90 tablet 3  . Azelastine HCl 137 MCG/SPRAY SOLN Place 1 spray into the nose daily as needed.    . cholecalciferol (VITAMIN D) 400 units TABS tablet Take 400 Units by mouth 2 (two) times daily.    . clobetasol (TEMOVATE) 0.05 % external solution Apply 1 application topically 2 (two) times daily as needed (scalp). Reported on 08/16/2015    . dexamethasone (DECADRON) 0.1 % ophthalmic suspension Place 1 drop into both eyes 2 (two) times daily. 15 mL 1  . fluticasone (FLONASE) 50 MCG/ACT nasal spray INSTILL 2 SPRAYS INTO EACH NOSTRIL ONCE DAILY 16 g 5  . glucose blood test strip USE TO CHECK BLOOD SUGAR DAILY 50 each 12  . lidocaine-prilocaine (EMLA) cream Apply to affected area once 30 g 3  . loratadine (CLARITIN) 10 MG tablet Take 10 mg by mouth daily.    . metFORMIN (GLUCOPHAGE) 500 MG tablet Take 500 mg by mouth at bedtime.    . metroNIDAZOLE (METROCREAM) 0.75 % cream Apply  1 application topically 2 (two) times daily as needed (rosacea).     . Multiple Vitamin (MULTIVITAMIN WITH MINERALS) TABS tablet Take 1 tablet by mouth daily.    [name] triamcinolone cream (KENALOG) 0.1 % Apply 1 application topically 2 (two) times daily as needed.    . valACYclovir (VALTREX) 500 MG tablet Take 500 mg by mouth 2 (two) times daily as needed.     No current facility-administered medications for this visit.     Allergies as of 05/19/2019 - Review Complete 04/30/2019  Allergen Reaction Noted  . Erythromycin  Anaphylaxis 06/26/2009  . Ciprofloxacin Rash 06/26/2009  . Daypro [oxaprozin] Rash 10/13/2012  . Enalapril maleate Nausea Only 10/06/2015  . Nsaids Nausea Only 10/13/2012  . Vioxx [rofecoxib] Nausea Only 10/13/2012    ROS:  General: Negative for anorexia, weight loss, fever, chills, fatigue, weakness. ENT: Negative for hoarseness, difficulty swallowing , nasal congestion. CV: Negative for chest pain, angina, palpitations, dyspnea on exertion, peripheral edema.  Respiratory: Negative for dyspnea at rest, dyspnea on exertion, cough, sputum, wheezing.  GI: See history of present illness. GU:  Negative for dysuria, hematuria, urinary incontinence, urinary frequency, nocturnal urination.  Endo: Negative for unusual weight change.    Physical Examination:   LMP 08/26/1992 (Approximate)   General: Well-nourished, well-developed in no acute distress.  Eyes: No icterus. Conjunctivae pink. Mouth: Oropharyngeal mucosa moist and pink , no lesions erythema or exudate. Lungs: Clear to auscultation bilaterally. Non-labored. Heart: Regular rate and rhythm, no murmurs rubs or gallops.  Abdomen: Bowel sounds are normal, mild right upper quadrant tenderness nondistended, no hepatosplenomegaly or masses, no abdominal bruits or hernia , no rebound or guarding.   Extremities: No lower extremity edema. No clubbing or deformities. Neuro: Alert and oriented x 3.  Grossly intact. Skin: Warm and dry, no jaundice.   Psych: Alert and cooperative, normal mood and affect.   Imaging Studies: No results found.  Assessment and Plan:   Katie [name] is a 73 y.o. y/o female here to see me today with right upper quadrant pain.  The nature of the pain has some features of irritable bowel.  It also has some features of her biliary quality of pain.  Although not typical of the same.  She also had an abnormality in the gallbladder fossa in February 2020 and with a history of breast cancer I would like to evaluate  this area further.  In terms of the change of bowel habits it could be an irritable bowel syndrome or it could be secondary to metformin or it could be secondary to trastuzumab  Plan 1.  MRI MRCP to rule out stones in the common bile duct as well as to evaluate the gallbladder fossa 2.  For irritable bowel commence on Bentyl for symptomatic relief of the pain along with IBgard samples which I provided for her. 3.  If the pain resolves but the diarrhea/change in bowel habits persists could consider a trial to take her off metformin to see if it improves.  If it does not could consider trial of Xifaxan for irritable bowel syndrome.  It is also possible that the trastuzumab may be causing the change in bowel habits as it is associated with the same in about 71%.  Symptoms do not seem to be severe enough to warrant discontinuation.  We could try options to continue the medication and help with the symptoms at the same time as it is important for her to continue the same for her breast cancer.  Dr Jonathon Bellows  MD,MRCP [facility]) Follow up in 2 to 3 weeks after the MRI

## 2019-05-20 ENCOUNTER — Other Ambulatory Visit: Payer: Self-pay

## 2019-05-20 ENCOUNTER — Telehealth: Payer: Self-pay | Admitting: Gastroenterology

## 2019-05-20 ENCOUNTER — Ambulatory Visit
Admission: RE | Admit: 2019-05-20 | Discharge: 2019-05-20 | Disposition: A | Discharge status: Home / Self Care | Payer: Medicare Other | Source: Ambulatory Visit | Attending: Radiation Oncology | Admitting: Radiation Oncology

## 2019-05-20 DIAGNOSIS — R1011 Right upper quadrant pain: Secondary | ICD-10-CM

## 2019-05-20 DIAGNOSIS — R933 Abnormal findings on diagnostic imaging of other parts of digestive tract: Secondary | ICD-10-CM

## 2019-05-20 DIAGNOSIS — Z170 Estrogen receptor positive status [ER+]: Secondary | ICD-10-CM | POA: Diagnosis not present

## 2019-05-20 DIAGNOSIS — C50412 Malignant neoplasm of upper-outer quadrant of left female breast: Secondary | ICD-10-CM | POA: Diagnosis not present

## 2019-05-20 DIAGNOSIS — Z510 Encounter for antineoplastic radiation therapy: Secondary | ICD-10-CM | POA: Diagnosis not present

## 2019-05-20 NOTE — Telephone Encounter (Signed)
PT  Is calling she saw Dr. Vicente Males yesterday and he wants to order a CT scan she is currently  In Chemo and would like to discuss the CT scan with her Radiation oncologist on Monday she will give [place] a call then to discuss. For now she would like to hold off the CT scan

## 2019-05-21 ENCOUNTER — Ambulatory Visit
Admission: RE | Admit: 2019-05-21 | Discharge: 2019-05-21 | Disposition: A | Discharge status: Home / Self Care | Payer: Medicare Other | Source: Ambulatory Visit | Attending: Radiation Oncology | Admitting: Radiation Oncology

## 2019-05-21 ENCOUNTER — Other Ambulatory Visit: Payer: Self-pay

## 2019-05-21 DIAGNOSIS — C50412 Malignant neoplasm of upper-outer quadrant of left female breast: Secondary | ICD-10-CM

## 2019-05-21 DIAGNOSIS — Z510 Encounter for antineoplastic radiation therapy: Secondary | ICD-10-CM | POA: Diagnosis not present

## 2019-05-21 DIAGNOSIS — Z170 Estrogen receptor positive status [ER+]: Secondary | ICD-10-CM | POA: Diagnosis not present

## 2019-05-21 MED ORDER — RADIAPLEXRX EX GEL
Freq: Once | CUTANEOUS | Status: AC
  Administered 2019-05-21: 10:00:00 via TOPICAL

## 2019-05-24 ENCOUNTER — Telehealth: Payer: Self-pay | Admitting: Gastroenterology

## 2019-05-24 ENCOUNTER — Ambulatory Visit
Admission: RE | Admit: 2019-05-24 | Discharge: 2019-05-24 | Disposition: A | Discharge status: Home / Self Care | Payer: Medicare Other | Source: Ambulatory Visit | Attending: Radiation Oncology | Admitting: Radiation Oncology

## 2019-05-24 ENCOUNTER — Other Ambulatory Visit: Payer: Self-pay

## 2019-05-24 DIAGNOSIS — Z510 Encounter for antineoplastic radiation therapy: Secondary | ICD-10-CM | POA: Diagnosis not present

## 2019-05-24 DIAGNOSIS — Z170 Estrogen receptor positive status [ER+]: Secondary | ICD-10-CM | POA: Diagnosis not present

## 2019-05-24 DIAGNOSIS — C50412 Malignant neoplasm of upper-outer quadrant of left female breast: Secondary | ICD-10-CM | POA: Diagnosis not present

## 2019-05-24 NOTE — Telephone Encounter (Signed)
Pt left vm she spoke with her Radiation  oncologist  and she okayed having the MRI so pt would like to schedule the MRI now. Please call pt

## 2019-05-25 ENCOUNTER — Other Ambulatory Visit: Payer: Self-pay

## 2019-05-25 ENCOUNTER — Ambulatory Visit
Admission: RE | Admit: 2019-05-25 | Discharge: 2019-05-25 | Disposition: A | Discharge status: Home / Self Care | Payer: Medicare Other | Source: Ambulatory Visit | Attending: Radiation Oncology | Admitting: Radiation Oncology

## 2019-05-25 ENCOUNTER — Encounter: Payer: Self-pay | Admitting: *Deleted

## 2019-05-25 DIAGNOSIS — C50412 Malignant neoplasm of upper-outer quadrant of left female breast: Secondary | ICD-10-CM | POA: Diagnosis not present

## 2019-05-25 DIAGNOSIS — Z170 Estrogen receptor positive status [ER+]: Secondary | ICD-10-CM | POA: Diagnosis not present

## 2019-05-25 DIAGNOSIS — Z510 Encounter for antineoplastic radiation therapy: Secondary | ICD-10-CM | POA: Diagnosis not present

## 2019-05-25 NOTE — Progress Notes (Signed)
Patient Care Team: [name], MD as PCP - General (Family Medicine) Birder [name], MD as Referring Physician (Ophthalmology) [name], MD as Consulting Physician (Dermatology) [name], MD (Inactive) as Consulting Physician (Orthopedic Surgery) [name], MD as Consulting Physician (General Surgery) [name], MD as Consulting Physician (Dentistry) [name], MD as Consulting Physician (General Surgery) [name], MD as Consulting Physician (Hematology and Oncology) [name], MD as Attending Physician (Radiation Oncology)  DIAGNOSIS:    ICD-10-CM   1. Malignant neoplasm of upper-outer quadrant of left breast in female, estrogen receptor positive ([place])  C50.412    Z17.0     SUMMARY OF ONCOLOGIC HISTORY: Oncology History  Malignant neoplasm of upper-outer quadrant of left breast in female, estrogen receptor positive ([place])  09/14/2018 Initial Diagnosis   With prior history of breast implants that were ruptured/replaced, patient had cellulitis LIQ left breast for 1 week, and UOQ asymmetry 1.5 cm, MRI right breast biopsy benign, left breast numerous masses 7 cm skin thickening: 2 biopsies from left breast: Grade 2-3 IDC ER PR positive, HER-2 positive, Ki-67 50%   09/16/2018 Cancer Staging   Staging form: Breast, AJCC 8th Edition - Clinical stage from 09/16/2018: Stage IIIB (cT4, cN0, cM0, G3, ER+, PR+, HER2+) - Signed by [name], MD on 09/16/2018   09/29/2018 -  Neo-Adjuvant Chemotherapy   TCHP q 3 weeks   01/19/2019 Breast MRI   Significant interval improvement, the multiple left breast masses and areas of non mass enhancement in all 4 quadrants have decreased in size and extent. The mass previously measuring 1.4 cm with diffuse enhancement now measures 9 mm with heterogeneous areas of enhancement. Current greatest transverse dimension is 5.4 x 3.9 cm, previously 7 cm x 5cm. 2. 4 mm masslike focus of enhancement at the base of the  right nipple is unchanged.   02/10/2019 - 03/23/2019 Chemotherapy   The patient had trastuzumab (HERCEPTIN) 450 mg in sodium chloride 0.9 % 250 mL chemo infusion, 420 mg (100 % of original dose 6 mg/kg), Intravenous,  Once, 2 of 5 cycles Dose modification: 6 mg/kg (original dose 6 mg/kg, Cycle 1, Reason: Provider Judgment) Administration: 450 mg (02/10/2019), 450 mg (03/03/2019)  for chemotherapy treatment.    03/08/2019 Surgery   Bilateral mastectomies [name]: right breast: no malignancy; left breast: IDC, grade 2, scattered over 7.5cm, clear margins.    03/08/2019 Cancer Staging   Staging form: Breast, AJCC 8th Edition - Pathologic stage from 03/08/2019: No Stage Recommended (ypT3, pNX, cM0, G2, ER+, PR+, HER2-) - Signed by [name], MD on 04/21/2019   03/24/2019 -  Chemotherapy   The patient had ado-trastuzumab emtansine (KADCYLA) 260 mg in sodium chloride 0.9 % 250 mL chemo infusion, 3.6 mg/kg = 260 mg, Intravenous, Once, 3 of 15 cycles Administration: 260 mg (03/24/2019), 260 mg (04/14/2019), 260 mg (05/05/2019)  for chemotherapy treatment.    05/04/2019 -  Radiation Therapy   Adjuvant radiation therapy     CHIEF COMPLIANT: Kadcyla maintenance  INTERVAL HISTORY: Katie [name] is a 73 y.o. with above-mentioned history of HER-2 positiveleft breast cancertreated withneoadjuvant chemotherapyfollowed by bilateral mastectomies. She is currently on adjuvant treatment with Kadcyla maintenance and undergoing radiation therapy. She presents to the clinic today for treatment. She has mild stomach discomfort which she does not think is related to Kadcyla. The grittiness in the eyes has improved with dexamethasone eyedrops. Starting to develop intermittent pain on the dorsum of the feet.  REVIEW OF SYSTEMS:   Constitutional: Denies  fevers, chills or abnormal weight loss Eyes: Denies blurriness of vision Ears, nose, mouth, throat, and face: Denies mucositis or sore throat Respiratory:  Denies cough, dyspnea or wheezes Cardiovascular: Denies palpitation, chest discomfort Gastrointestinal: Mild stomach discomfort Skin: Denies abnormal skin rashes Lymphatics: Denies new lymphadenopathy or easy bruising Neurological: Intermittent pain of the dorsum of the feet Behavioral/Psych: Mood is stable, no new changes  Extremities: No lower extremity edema Breast: denies any pain or lumps or nodules in either breasts All other systems were reviewed with the patient and are negative.  I have reviewed the past medical history, past surgical history, social history and family history with the patient and they are unchanged from previous note.  ALLERGIES:  is allergic to erythromycin; ciprofloxacin; daypro [oxaprozin]; enalapril maleate; nsaids; and vioxx [rofecoxib].  MEDICATIONS:  Current Outpatient Medications  Medication Sig Dispense Refill   amLODipine (NORVASC) 10 MG tablet Take 1 tablet (10 mg total) by mouth daily. 90 tablet 3   atorvastatin (LIPITOR) 10 MG tablet TAKE 1 TABLET(10 MG) BY MOUTH DAILY 90 tablet 3   Azelastine HCl 137 MCG/SPRAY SOLN Place 1 spray into the nose daily as needed.     cholecalciferol (VITAMIN D) 400 units TABS tablet Take 400 Units by mouth 2 (two) times daily.     clobetasol (TEMOVATE) 0.05 % external solution Apply 1 application topically 2 (two) times daily as needed (scalp). Reported on 08/16/2015     dexamethasone (DECADRON) 0.1 % ophthalmic suspension Place 1 drop into both eyes 2 (two) times daily. 15 mL 1   dicyclomine (BENTYL) 10 MG capsule Take 1 capsule (10 mg total) by mouth 4 (four) times daily -  before meals and at bedtime. 120 capsule 2   fluticasone (FLONASE) 50 MCG/ACT nasal spray INSTILL 2 SPRAYS INTO EACH NOSTRIL ONCE DAILY 16 g 5   glucose blood test strip USE TO CHECK BLOOD SUGAR DAILY 50 each 12   lidocaine-prilocaine (EMLA) cream Apply to affected area once 30 g 3   loratadine (CLARITIN) 10 MG tablet Take 10 mg by  mouth daily.     metFORMIN (GLUCOPHAGE) 500 MG tablet Take 500 mg by mouth at bedtime.     metroNIDAZOLE (METROCREAM) 0.75 % cream Apply 1 application topically 2 (two) times daily as needed (rosacea).      Multiple Vitamin (MULTIVITAMIN WITH MINERALS) TABS tablet Take 1 tablet by mouth daily.     triamcinolone cream (KENALOG) 0.1 % Apply 1 application topically 2 (two) times daily as needed.     valACYclovir (VALTREX) 500 MG tablet Take 500 mg by mouth 2 (two) times daily as needed.     No current facility-administered medications for this visit.     PHYSICAL EXAMINATION: ECOG PERFORMANCE STATUS: 1 - Symptomatic but completely ambulatory  Vitals:   05/26/19 0828  BP: (!) 140/54  Pulse: 69  Resp: 18  Temp: 97.8 F (36.6 C)  SpO2: 98%   Filed Weights   05/26/19 0828  Weight: 149 lb 4.8 oz (67.7 kg)    GENERAL: alert, no distress and comfortable SKIN: skin color, texture, turgor are normal, no rashes or significant lesions EYES: normal, Conjunctiva are pink and non-injected, sclera clear OROPHARYNX: no exudate, no erythema and lips, buccal mucosa, and tongue normal  NECK: supple, thyroid normal size, non-tender, without nodularity LYMPH: no palpable lymphadenopathy in the cervical, axillary or inguinal LUNGS: clear to auscultation and percussion with normal breathing effort HEART: regular rate & rhythm and no murmurs and no lower extremity edema  ABDOMEN: abdomen soft, non-tender and normal bowel sounds MUSCULOSKELETAL: no cyanosis of digits and no clubbing  NEURO: alert & oriented x 3 with fluent speech, no focal motor/sensory deficits EXTREMITIES: No lower extremity edema  LABORATORY DATA:  I have reviewed the data as listed CMP Latest Ref Rng & Units 05/05/2019 04/27/2019 04/14/2019  Glucose 70 - 99 mg/dL 108(H) 136(H) 107(H)  BUN 8 - 23 mg/dL 18 16 16   Creatinine 0.44 - 1.00 mg/dL 0.83 0.76 0.80  Sodium 135 - 145 mmol/L 142 141 140  Potassium 3.5 - 5.1 mmol/L 3.8 3.6  3.8  Chloride 98 - 111 mmol/L 104 101 105  CO2 22 - 32 mmol/L 25 31 24   Calcium 8.9 - 10.3 mg/dL 9.7 9.6 9.6  Total Protein 6.5 - 8.1 g/dL 7.4 7.0 7.2  Total Bilirubin 0.3 - 1.2 mg/dL 0.4 0.5 0.4  Alkaline Phos 38 - 126 U/L 81 74 83  AST 15 - 41 U/L 27 35 27  ALT 0 - 44 U/L 27 35 33    Lab Results  Component Value Date   WBC 2.8 (L) 05/26/2019   HGB 10.4 (L) 05/26/2019   HCT 31.3 (L) 05/26/2019   MCV 100.0 05/26/2019   PLT 98 (L) 05/26/2019   NEUTROABS 1.7 05/26/2019    ASSESSMENT & PLAN:  Malignant neoplasm of upper-outer quadrant of left breast in female, estrogen receptor positive ([place]) With prior history of breast implants that were ruptured/replaced, patient had cellulitis LIQ left breast for 1 week, and UOQ asymmetry 1.5 cm, MRI right breast biopsy benign, left breast numerous masses 7 cm skin thickening: 2 biopsies from left breast: Grade 2-3 IDC ER PR positive, HER-2 positive, Ki-67 50% Clinical stage: T4 N0 M0 stage IIIb  Treatment plan 1. Neoadjuvant chemotherapy with [place] Perjeta 6 cyclescompleted 5/6/2020followed by Herceptin/ Perjeta vs Kadcylamaintenance for 1 year 2. Followed byBilmastectomieswith sentinel lymph node study 03/08/2019 3. Followed by adjuvant radiation therapy started 05/04/2019 4.Followed by adjuvant antiestrogen therapy ------------------------------------------------------------------------------------------------------------------------------------ 03/08/2019: Bilateral mastectomies: Left mastectomy: Grade 2 IDC spanning 7.5 cm, margins negative, right mastectomy: Benign, ER 95%, PR 5%, HER-2 1+ negative pathology lost the lymph node excision.   Treatment plan: Kadcyla maintenance every 3 weeks Toxicities:  1. Nausea 2.  Mild abdominal discomfort: on Nexium 3.  Mild thrombocytopenia: Monitoring closely. 4.  Leukopenia and anemia: Dose reduction 5.  Neuropathy sensation of the dorsum of the feet intermittently: Monitoring  closely  Denies any diarrhea. Marked improvement in the hemoglobin levels today's hemoglobin is 10.4  Radiation started 05/05/2019. Antiestrogen therapy after radiation is complete Return to clinic every 3 weeks with labs and MD visit and follow-up for Kadcyla.    No orders of the defined types were placed in this encounter.  The patient has a good understanding of the overall plan. she agrees with it. she will call with any problems that may develop before the next visit here.  [name], MD 05/26/2019  Julious Oka Dorshimer am acting as scribe for Dr. [name].  I have reviewed the above documentation for accuracy and completeness, and I agree with the above.

## 2019-05-26 ENCOUNTER — Ambulatory Visit: Payer: Medicare Other | Admitting: Hematology and Oncology

## 2019-05-26 ENCOUNTER — Inpatient Hospital Stay: Payer: Medicare Other

## 2019-05-26 ENCOUNTER — Other Ambulatory Visit: Payer: Self-pay

## 2019-05-26 ENCOUNTER — Ambulatory Visit
Admission: RE | Admit: 2019-05-26 | Discharge: 2019-05-26 | Disposition: A | Discharge status: Home / Self Care | Payer: Medicare Other | Source: Ambulatory Visit | Attending: Radiation Oncology | Admitting: Radiation Oncology

## 2019-05-26 ENCOUNTER — Other Ambulatory Visit: Payer: Medicare Other

## 2019-05-26 ENCOUNTER — Inpatient Hospital Stay (HOSPITAL_BASED_OUTPATIENT_CLINIC_OR_DEPARTMENT_OTHER): Payer: Medicare Other | Admitting: Hematology and Oncology

## 2019-05-26 ENCOUNTER — Ambulatory Visit: Payer: Medicare Other

## 2019-05-26 DIAGNOSIS — Z170 Estrogen receptor positive status [ER+]: Secondary | ICD-10-CM | POA: Diagnosis not present

## 2019-05-26 DIAGNOSIS — C50412 Malignant neoplasm of upper-outer quadrant of left female breast: Secondary | ICD-10-CM

## 2019-05-26 DIAGNOSIS — Z95828 Presence of other vascular implants and grafts: Secondary | ICD-10-CM

## 2019-05-26 DIAGNOSIS — D696 Thrombocytopenia, unspecified: Secondary | ICD-10-CM | POA: Diagnosis not present

## 2019-05-26 DIAGNOSIS — I10 Essential (primary) hypertension: Secondary | ICD-10-CM | POA: Diagnosis not present

## 2019-05-26 DIAGNOSIS — Z5112 Encounter for antineoplastic immunotherapy: Secondary | ICD-10-CM | POA: Diagnosis not present

## 2019-05-26 DIAGNOSIS — E785 Hyperlipidemia, unspecified: Secondary | ICD-10-CM | POA: Diagnosis not present

## 2019-05-26 DIAGNOSIS — Z510 Encounter for antineoplastic radiation therapy: Secondary | ICD-10-CM | POA: Diagnosis not present

## 2019-05-26 LAB — CBC WITH DIFFERENTIAL (CANCER CENTER ONLY)
Abs Immature Granulocytes: 0.01 10*3/uL (ref 0.00–0.07)
Basophils Absolute: 0 10*3/uL (ref 0.0–0.1)
Basophils Relative: 1 %
Eosinophils Absolute: 0.1 10*3/uL (ref 0.0–0.5)
Eosinophils Relative: 3 %
HCT: 31.3 % — ABNORMAL LOW (ref 36.0–46.0)
Hemoglobin: 10.4 g/dL — ABNORMAL LOW (ref 12.0–15.0)
Immature Granulocytes: 0 %
Lymphocytes Relative: 23 %
Lymphs Abs: 0.6 10*3/uL — ABNORMAL LOW (ref 0.7–4.0)
MCH: 33.2 pg (ref 26.0–34.0)
MCHC: 33.2 g/dL (ref 30.0–36.0)
MCV: 100 fL (ref 80.0–100.0)
Monocytes Absolute: 0.3 10*3/uL (ref 0.1–1.0)
Monocytes Relative: 12 %
Neutro Abs: 1.7 10*3/uL (ref 1.7–7.7)
Neutrophils Relative %: 61 %
Platelet Count: 98 10*3/uL — ABNORMAL LOW (ref 150–400)
RBC: 3.13 MIL/uL — ABNORMAL LOW (ref 3.87–5.11)
RDW: 14.1 % (ref 11.5–15.5)
WBC Count: 2.8 10*3/uL — ABNORMAL LOW (ref 4.0–10.5)
nRBC: 0 % (ref 0.0–0.2)

## 2019-05-26 LAB — CMP (CANCER CENTER ONLY)
ALT: 31 U/L (ref 0–44)
AST: 27 U/L (ref 15–41)
Albumin: 3.8 g/dL (ref 3.5–5.0)
Alkaline Phosphatase: 87 U/L (ref 38–126)
Anion gap: 11 (ref 5–15)
BUN: 15 mg/dL (ref 8–23)
CO2: 26 mmol/L (ref 22–32)
Calcium: 9.5 mg/dL (ref 8.9–10.3)
Chloride: 103 mmol/L (ref 98–111)
Creatinine: 0.77 mg/dL (ref 0.44–1.00)
GFR, Est AFR Am: >60 mL/min (ref >60)
GFR, Estimated: >60 mL/min (ref >60)
Glucose, Bld: 116 mg/dL — ABNORMAL HIGH (ref 70–99)
Potassium: 3.8 mmol/L (ref 3.5–5.1)
Sodium: 140 mmol/L (ref 135–145)
Total Bilirubin: 0.3 mg/dL (ref 0.3–1.2)
Total Protein: 7.4 g/dL (ref 6.5–8.1)

## 2019-05-26 MED ORDER — HEPARIN SOD (PORK) LOCK FLUSH 100 UNIT/ML IV SOLN
500.0000 [IU] | Freq: Once | INTRAVENOUS | Status: AC | PRN
  Administered 2019-05-26: 500 [IU]
  Filled 2019-05-26: qty 5

## 2019-05-26 MED ORDER — SODIUM CHLORIDE 0.9% FLUSH
10.0000 mL | Freq: Once | INTRAVENOUS | Status: AC
  Administered 2019-05-26: 10 mL
  Filled 2019-05-26: qty 10

## 2019-05-26 MED ORDER — DIPHENHYDRAMINE HCL 25 MG PO CAPS
50.0000 mg | ORAL_CAPSULE | Freq: Once | ORAL | Status: AC
  Administered 2019-05-26: 10:00:00 50 mg via ORAL

## 2019-05-26 MED ORDER — SODIUM CHLORIDE 0.9 % IV SOLN
Freq: Once | INTRAVENOUS | Status: AC
  Administered 2019-05-26: 10:00:00 via INTRAVENOUS
  Filled 2019-05-26: qty 250

## 2019-05-26 MED ORDER — SODIUM CHLORIDE 0.9% FLUSH
10.0000 mL | INTRAVENOUS | Status: DC | PRN
  Administered 2019-05-26: 10 mL
  Filled 2019-05-26: qty 10

## 2019-05-26 MED ORDER — ACETAMINOPHEN 325 MG PO TABS
ORAL_TABLET | ORAL | Status: AC
  Filled 2019-05-26: qty 2

## 2019-05-26 MED ORDER — SODIUM CHLORIDE 0.9 % IV SOLN
3.0000 mg/kg | Freq: Once | INTRAVENOUS | Status: AC
  Administered 2019-05-26: 11:00:00 200 mg via INTRAVENOUS
  Filled 2019-05-26: qty 10

## 2019-05-26 MED ORDER — ACETAMINOPHEN 325 MG PO TABS
650.0000 mg | ORAL_TABLET | Freq: Once | ORAL | Status: AC
  Administered 2019-05-26: 10:00:00 650 mg via ORAL

## 2019-05-26 MED ORDER — DIPHENHYDRAMINE HCL 25 MG PO CAPS
ORAL_CAPSULE | ORAL | Status: AC
  Filled 2019-05-26: qty 2

## 2019-05-26 NOTE — Assessment & Plan Note (Signed)
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 Neoadjuvant chemotherapy with [place] Perjeta 6 cyclescompleted 5/6/2020followed by Herceptin/ Perjeta vs Kadcylamaintenance for 1 year 2. Followed byBilmastectomieswith sentinel lymph node study 03/08/2019 3. Followed by adjuvant radiation therapy started 05/04/2019 4.Followed by adjuvant antiestrogen therapy ------------------------------------------------------------------------------------------------------------------------------------ 03/08/2019: Bilateral mastectomies: Left mastectomy: Grade 2 IDC spanning 7.5 cm, margins negative, right mastectomy: Benign, ER 95%, PR 5%, HER-2 1+ negative pathology lost the lymph node excision.   Treatment plan: Kadcyla maintenance every 3 weeks Toxicities:  1. Nausea 2.  Mild abdominal discomfort: on Nexium 3.  Mild thrombocytopenia: Monitoring closely.  Denies any diarrhea. Marked improvement in the hemoglobin levels today's hemoglobin is    Radiation started 05/05/2019. Antiestrogen therapy after radiation is complete Return to clinic every 3 weeks with labs and MD visit and follow-up for Kadcyla.

## 2019-05-26 NOTE — Telephone Encounter (Signed)
Spoke with pt yesterday and informed her that we will proceed with the MRI. Pt appointment has been scheduled.

## 2019-05-26 NOTE — Progress Notes (Signed)
Ok to treat with plts 98 per Dr. Lindi Adie

## 2019-05-26 NOTE — Patient Instructions (Signed)
[facility] Discharge Instructions for Patients Receiving Chemotherapy  Today you received the following chemotherapy agents Kadcyla (Ado-Trastuzumab Emtansine)  To help prevent nausea and vomiting after your treatment, we encourage you to take your nausea medication as directed. If you develop nausea and vomiting that is not controlled by your nausea medication, call the clinic.   BELOW ARE SYMPTOMS THAT SHOULD BE REPORTED IMMEDIATELY:  *FEVER GREATER THAN 100.5 F  *CHILLS WITH OR WITHOUT FEVER  NAUSEA AND VOMITING THAT IS NOT CONTROLLED WITH YOUR NAUSEA MEDICATION  *UNUSUAL SHORTNESS OF BREATH  *UNUSUAL BRUISING OR BLEEDING  TENDERNESS IN MOUTH AND THROAT WITH OR WITHOUT PRESENCE OF ULCERS  *URINARY PROBLEMS  *BOWEL PROBLEMS  UNUSUAL RASH Items with * indicate a potential emergency and should be followed up as soon as possible.  Feel free to call the clinic should you have any questions or concerns. The clinic phone number is [phone].  Please show the CHEMO ALERT CARD at check-in to the Emergency Department and triage nurse.  Ado-Trastuzumab Emtansine for injection(Kadcyla) What is this medicine? ADO-TRASTUZUMAB EMTANSINE (ADD oh traz TOO zuh mab em TAN zine) is a monoclonal antibody combined with chemotherapy. It is used to treat breast cancer. This medicine may be used for other purposes; ask your health care provider or pharmacist if you have questions. COMMON BRAND NAME(S): Kadcyla What should I tell my health care provider before I take this medicine? They need to know if you have any of these conditions:  heart disease  heart failure  infection (especially a virus infection such as chickenpox, cold sores, or herpes)  liver disease  lung or breathing disease, like asthma  tingling of the fingers or toes, or other nerve disorder  an unusual or allergic reaction to ado-trastuzumab emtansine, other medications, foods, dyes, or  preservatives  pregnant or trying to get pregnant  breast-feeding How should I use this medicine? This medicine is for infusion into a vein. It is given by a health care professional in a hospital or clinic setting. Talk to your pediatrician regarding the use of this medicine in children. Special care may be needed. Overdosage: If you think you have taken too much of this medicine contact a poison control center or emergency room at once. NOTE: This medicine is only for you. Do not share this medicine with others. What if I miss a dose? It is important not to miss your dose. Call your doctor or health care professional if you are unable to keep an appointment. What may interact with this medicine? This medicine may also interact with the following medications:  atazanavir  boceprevir  clarithromycin  delavirdine  indinavir  dalfopristin; quinupristin  isoniazid, INH  itraconazole  ketoconazole  nefazodone  nelfinavir  ritonavir  telaprevir  telithromycin  tipranavir  voriconazole This list may not describe all possible interactions. Give your health care provider a list of all the medicines, herbs, non-prescription drugs, or dietary supplements you use. Also tell them if you smoke, drink alcohol, or use illegal drugs. Some items may interact with your medicine. What should I watch for while using this medicine? Visit your doctor for checks on your progress. This drug may make you feel generally unwell. This is not uncommon, as chemotherapy can affect healthy cells as well as cancer cells. Report any side effects. Continue your course of treatment even though you feel ill unless your doctor tells you to stop. You may need blood work done while you are taking this medicine.   Call your doctor or health care professional for advice if you get a fever, chills or sore throat, or other symptoms of a cold or flu. Do not treat yourself. This drug decreases your body's ability  to fight infections. Try to avoid being around people who are sick. Be careful brushing and flossing your teeth or using a toothpick because you may get an infection or bleed more easily. If you have any dental work done, tell your dentist you are receiving this medicine. Avoid taking products that contain aspirin, acetaminophen, ibuprofen, naproxen, or ketoprofen unless instructed by your doctor. These medicines may hide a fever. Do not become pregnant while taking this medicine or for 7 months after stopping it, men with female partners should use contraception during treatment and for 4 months after the last dose. Women should inform their doctor if they wish to become pregnant or think they might be pregnant. There is a potential for serious side effects to an unborn child. Do not breast-feed an infant while taking this medicine or for 7 months after the last dose. Men who have a partner who is pregnant or who is capable of becoming pregnant should use a condom during sexual activity while taking this medicine and for 4 months after stopping it. Men should inform their doctors if they wish to father a child. This medicine may lower sperm counts. Talk to your health care professional or pharmacist for more information. What side effects may I notice from receiving this medicine? Side effects that you should report to your doctor or health care professional as soon as possible:  allergic reactions like skin rash, itching or hives, swelling of the face, lips, or tongue  breathing problems  chest pain or palpitations  fever or chills, sore throat  general ill feeling or flu-like symptoms  light-colored stools  nausea, vomiting  pain, tingling, numbness in the hands or feet  signs and symptoms of bleeding such as bloody or black, tarry stools; red or dark-brown urine; spitting up blood or brown material that looks like coffee grounds; red spots on the skin; unusual bruising or bleeding from  the eye, gums, or nose  swelling of the legs or ankles  yellowing of the eyes or skin Side effects that usually do not require medical attention (report to your doctor or health care professional if they continue or are bothersome):  changes in taste  constipation  dizziness  headache  joint pain  muscle pain  trouble sleeping  unusually weak or tired This list may not describe all possible side effects. Call your doctor for medical advice about side effects. You may report side effects to FDA at 1-800-FDA-1088. Where should I keep my medicine? This drug is given in a hospital or clinic and will not be stored at home. NOTE: This sheet is a summary. It may not cover all possible information. If you have questions about this medicine, talk to your doctor, pharmacist, or health care provider.  2020 Elsevier/Gold Standard (2018-01-09 10:03:15)  

## 2019-05-27 ENCOUNTER — Other Ambulatory Visit: Payer: Self-pay

## 2019-05-27 ENCOUNTER — Ambulatory Visit
Admission: RE | Admit: 2019-05-27 | Discharge: 2019-05-27 | Disposition: A | Discharge status: Home / Self Care | Payer: Medicare Other | Source: Ambulatory Visit | Attending: Radiation Oncology | Admitting: Radiation Oncology

## 2019-05-27 DIAGNOSIS — Z170 Estrogen receptor positive status [ER+]: Secondary | ICD-10-CM | POA: Diagnosis not present

## 2019-05-27 DIAGNOSIS — C50412 Malignant neoplasm of upper-outer quadrant of left female breast: Secondary | ICD-10-CM | POA: Diagnosis not present

## 2019-05-27 DIAGNOSIS — Z510 Encounter for antineoplastic radiation therapy: Secondary | ICD-10-CM | POA: Diagnosis not present

## 2019-05-28 ENCOUNTER — Ambulatory Visit
Admission: RE | Admit: 2019-05-28 | Discharge: 2019-05-28 | Disposition: A | Discharge status: Home / Self Care | Payer: Medicare Other | Source: Ambulatory Visit | Attending: Radiation Oncology | Admitting: Radiation Oncology

## 2019-05-28 ENCOUNTER — Other Ambulatory Visit: Payer: Self-pay

## 2019-05-28 DIAGNOSIS — Z510 Encounter for antineoplastic radiation therapy: Secondary | ICD-10-CM | POA: Diagnosis not present

## 2019-05-28 DIAGNOSIS — C50412 Malignant neoplasm of upper-outer quadrant of left female breast: Secondary | ICD-10-CM | POA: Diagnosis not present

## 2019-05-28 DIAGNOSIS — Z170 Estrogen receptor positive status [ER+]: Secondary | ICD-10-CM | POA: Diagnosis not present

## 2019-05-31 ENCOUNTER — Ambulatory Visit
Admission: RE | Admit: 2019-05-31 | Discharge: 2019-05-31 | Disposition: A | Discharge status: Home / Self Care | Payer: Medicare Other | Source: Ambulatory Visit | Attending: Radiation Oncology | Admitting: Radiation Oncology

## 2019-05-31 ENCOUNTER — Other Ambulatory Visit: Payer: Self-pay

## 2019-05-31 DIAGNOSIS — C50412 Malignant neoplasm of upper-outer quadrant of left female breast: Secondary | ICD-10-CM | POA: Diagnosis not present

## 2019-05-31 DIAGNOSIS — Z170 Estrogen receptor positive status [ER+]: Secondary | ICD-10-CM | POA: Diagnosis not present

## 2019-05-31 DIAGNOSIS — Z510 Encounter for antineoplastic radiation therapy: Secondary | ICD-10-CM | POA: Diagnosis not present

## 2019-06-01 ENCOUNTER — Ambulatory Visit
Admission: RE | Admit: 2019-06-01 | Discharge: 2019-06-01 | Disposition: A | Discharge status: Home / Self Care | Payer: Medicare Other | Source: Ambulatory Visit | Attending: Radiation Oncology | Admitting: Radiation Oncology

## 2019-06-01 ENCOUNTER — Other Ambulatory Visit: Payer: Self-pay

## 2019-06-01 DIAGNOSIS — Z170 Estrogen receptor positive status [ER+]: Secondary | ICD-10-CM | POA: Diagnosis not present

## 2019-06-01 DIAGNOSIS — Z510 Encounter for antineoplastic radiation therapy: Secondary | ICD-10-CM | POA: Diagnosis not present

## 2019-06-01 DIAGNOSIS — C50412 Malignant neoplasm of upper-outer quadrant of left female breast: Secondary | ICD-10-CM | POA: Diagnosis not present

## 2019-06-02 ENCOUNTER — Ambulatory Visit
Admission: RE | Admit: 2019-06-02 | Discharge: 2019-06-02 | Disposition: A | Discharge status: Home / Self Care | Payer: Medicare Other | Source: Ambulatory Visit | Attending: Radiation Oncology | Admitting: Radiation Oncology

## 2019-06-02 ENCOUNTER — Other Ambulatory Visit: Payer: Self-pay

## 2019-06-02 DIAGNOSIS — Z170 Estrogen receptor positive status [ER+]: Secondary | ICD-10-CM | POA: Diagnosis not present

## 2019-06-02 DIAGNOSIS — C50412 Malignant neoplasm of upper-outer quadrant of left female breast: Secondary | ICD-10-CM | POA: Diagnosis not present

## 2019-06-02 DIAGNOSIS — Z510 Encounter for antineoplastic radiation therapy: Secondary | ICD-10-CM | POA: Diagnosis not present

## 2019-06-03 ENCOUNTER — Other Ambulatory Visit: Payer: Self-pay

## 2019-06-03 ENCOUNTER — Ambulatory Visit
Admission: RE | Admit: 2019-06-03 | Discharge: 2019-06-03 | Disposition: A | Discharge status: Home / Self Care | Payer: Medicare Other | Source: Ambulatory Visit | Attending: Radiation Oncology | Admitting: Radiation Oncology

## 2019-06-03 DIAGNOSIS — Z170 Estrogen receptor positive status [ER+]: Secondary | ICD-10-CM

## 2019-06-03 DIAGNOSIS — C50412 Malignant neoplasm of upper-outer quadrant of left female breast: Secondary | ICD-10-CM | POA: Diagnosis not present

## 2019-06-03 DIAGNOSIS — Z510 Encounter for antineoplastic radiation therapy: Secondary | ICD-10-CM | POA: Diagnosis not present

## 2019-06-03 MED ORDER — RADIAPLEXRX EX GEL
Freq: Once | CUTANEOUS | Status: AC
  Administered 2019-06-03: 10:00:00 via TOPICAL

## 2019-06-04 ENCOUNTER — Other Ambulatory Visit: Payer: Self-pay

## 2019-06-04 ENCOUNTER — Ambulatory Visit
Admission: RE | Admit: 2019-06-04 | Discharge: 2019-06-04 | Disposition: A | Discharge status: Home / Self Care | Payer: Medicare Other | Source: Ambulatory Visit | Attending: Radiation Oncology | Admitting: Radiation Oncology

## 2019-06-04 DIAGNOSIS — Z510 Encounter for antineoplastic radiation therapy: Secondary | ICD-10-CM | POA: Diagnosis not present

## 2019-06-04 DIAGNOSIS — C50412 Malignant neoplasm of upper-outer quadrant of left female breast: Secondary | ICD-10-CM | POA: Diagnosis not present

## 2019-06-04 DIAGNOSIS — Z170 Estrogen receptor positive status [ER+]: Secondary | ICD-10-CM | POA: Diagnosis not present

## 2019-06-07 ENCOUNTER — Other Ambulatory Visit: Payer: Self-pay

## 2019-06-07 ENCOUNTER — Ambulatory Visit
Admission: RE | Admit: 2019-06-07 | Discharge: 2019-06-07 | Disposition: A | Discharge status: Home / Self Care | Payer: Medicare Other | Source: Ambulatory Visit | Attending: Radiation Oncology | Admitting: Radiation Oncology

## 2019-06-07 DIAGNOSIS — C50412 Malignant neoplasm of upper-outer quadrant of left female breast: Secondary | ICD-10-CM

## 2019-06-07 DIAGNOSIS — Z170 Estrogen receptor positive status [ER+]: Secondary | ICD-10-CM | POA: Diagnosis not present

## 2019-06-07 DIAGNOSIS — Z510 Encounter for antineoplastic radiation therapy: Secondary | ICD-10-CM | POA: Diagnosis not present

## 2019-06-07 MED ORDER — RADIAPLEXRX EX GEL
Freq: Once | CUTANEOUS | Status: AC
  Administered 2019-06-07: 10:00:00 via TOPICAL

## 2019-06-08 ENCOUNTER — Other Ambulatory Visit: Payer: Self-pay | Admitting: Gastroenterology

## 2019-06-08 ENCOUNTER — Other Ambulatory Visit: Payer: Self-pay

## 2019-06-08 ENCOUNTER — Ambulatory Visit (HOSPITAL_COMMUNITY)
Admission: RE | Admit: 2019-06-08 | Discharge: 2019-06-08 | Disposition: A | Discharge status: Home / Self Care | Payer: Medicare Other | Source: Ambulatory Visit | Attending: Gastroenterology | Admitting: Gastroenterology

## 2019-06-08 ENCOUNTER — Ambulatory Visit
Admission: RE | Admit: 2019-06-08 | Discharge: 2019-06-08 | Disposition: A | Discharge status: Home / Self Care | Payer: Medicare Other | Source: Ambulatory Visit | Attending: Radiation Oncology | Admitting: Radiation Oncology

## 2019-06-08 DIAGNOSIS — R933 Abnormal findings on diagnostic imaging of other parts of digestive tract: Secondary | ICD-10-CM | POA: Insufficient documentation

## 2019-06-08 DIAGNOSIS — R1011 Right upper quadrant pain: Secondary | ICD-10-CM

## 2019-06-08 DIAGNOSIS — Z170 Estrogen receptor positive status [ER+]: Secondary | ICD-10-CM | POA: Diagnosis not present

## 2019-06-08 DIAGNOSIS — Z510 Encounter for antineoplastic radiation therapy: Secondary | ICD-10-CM | POA: Diagnosis not present

## 2019-06-08 DIAGNOSIS — C50412 Malignant neoplasm of upper-outer quadrant of left female breast: Secondary | ICD-10-CM | POA: Diagnosis not present

## 2019-06-08 MED ORDER — GADOBUTROL 1 MMOL/ML IV SOLN
7.0000 mL | Freq: Once | INTRAVENOUS | Status: AC | PRN
  Administered 2019-06-08: 09:00:00 7 mL via INTRAVENOUS

## 2019-06-09 ENCOUNTER — Other Ambulatory Visit: Payer: Self-pay

## 2019-06-09 ENCOUNTER — Encounter: Payer: Self-pay | Admitting: Gastroenterology

## 2019-06-09 ENCOUNTER — Ambulatory Visit
Admission: RE | Admit: 2019-06-09 | Discharge: 2019-06-09 | Disposition: A | Discharge status: Home / Self Care | Payer: Medicare Other | Source: Ambulatory Visit | Attending: Radiation Oncology | Admitting: Radiation Oncology

## 2019-06-09 ENCOUNTER — Telehealth: Payer: Self-pay

## 2019-06-09 DIAGNOSIS — Z170 Estrogen receptor positive status [ER+]: Secondary | ICD-10-CM | POA: Diagnosis not present

## 2019-06-09 DIAGNOSIS — C50412 Malignant neoplasm of upper-outer quadrant of left female breast: Secondary | ICD-10-CM | POA: Diagnosis not present

## 2019-06-09 DIAGNOSIS — Z510 Encounter for antineoplastic radiation therapy: Secondary | ICD-10-CM | POA: Diagnosis not present

## 2019-06-09 NOTE — Telephone Encounter (Signed)
-----   Message from Jonathon Bellows, MD sent at 06/09/2019  8:42 AM EDT ----- Inform MRI is completely normal.  Inquire how she is doing.  She should probably have a follow-up visit to make sure everything is fine with her

## 2019-06-09 NOTE — Progress Notes (Signed)
Inform MRI is completely normal.  Inquire how she is doing.  She should probably have a follow-up visit to make sure everything is fine with her

## 2019-06-09 NOTE — Telephone Encounter (Signed)
Spoke with pt and informed her of MRI result. Pt states she's doing slightly better. Pt already has a follow up scheduled for next week.

## 2019-06-10 ENCOUNTER — Ambulatory Visit
Admission: RE | Admit: 2019-06-10 | Discharge: 2019-06-10 | Disposition: A | Discharge status: Home / Self Care | Payer: Medicare Other | Source: Ambulatory Visit | Attending: Radiation Oncology | Admitting: Radiation Oncology

## 2019-06-10 ENCOUNTER — Other Ambulatory Visit: Payer: Self-pay

## 2019-06-10 DIAGNOSIS — C50412 Malignant neoplasm of upper-outer quadrant of left female breast: Secondary | ICD-10-CM | POA: Diagnosis not present

## 2019-06-10 DIAGNOSIS — Z170 Estrogen receptor positive status [ER+]: Secondary | ICD-10-CM | POA: Diagnosis not present

## 2019-06-10 DIAGNOSIS — Z510 Encounter for antineoplastic radiation therapy: Secondary | ICD-10-CM | POA: Diagnosis not present

## 2019-06-11 ENCOUNTER — Other Ambulatory Visit: Payer: Self-pay

## 2019-06-11 ENCOUNTER — Ambulatory Visit
Admission: RE | Admit: 2019-06-11 | Discharge: 2019-06-11 | Disposition: A | Discharge status: Home / Self Care | Payer: Medicare Other | Source: Ambulatory Visit | Attending: Radiation Oncology | Admitting: Radiation Oncology

## 2019-06-11 DIAGNOSIS — C50412 Malignant neoplasm of upper-outer quadrant of left female breast: Secondary | ICD-10-CM | POA: Diagnosis not present

## 2019-06-11 DIAGNOSIS — Z170 Estrogen receptor positive status [ER+]: Secondary | ICD-10-CM | POA: Diagnosis not present

## 2019-06-11 DIAGNOSIS — Z510 Encounter for antineoplastic radiation therapy: Secondary | ICD-10-CM | POA: Diagnosis not present

## 2019-06-14 ENCOUNTER — Encounter: Payer: Self-pay | Admitting: *Deleted

## 2019-06-14 ENCOUNTER — Other Ambulatory Visit: Payer: Self-pay

## 2019-06-14 ENCOUNTER — Ambulatory Visit
Admission: RE | Admit: 2019-06-14 | Discharge: 2019-06-14 | Disposition: A | Discharge status: Home / Self Care | Payer: Medicare Other | Source: Ambulatory Visit | Attending: Radiation Oncology | Admitting: Radiation Oncology

## 2019-06-14 ENCOUNTER — Encounter: Payer: Self-pay | Admitting: Radiation Oncology

## 2019-06-14 DIAGNOSIS — Z170 Estrogen receptor positive status [ER+]: Secondary | ICD-10-CM | POA: Diagnosis not present

## 2019-06-14 DIAGNOSIS — Z510 Encounter for antineoplastic radiation therapy: Secondary | ICD-10-CM | POA: Diagnosis not present

## 2019-06-14 DIAGNOSIS — C50412 Malignant neoplasm of upper-outer quadrant of left female breast: Secondary | ICD-10-CM | POA: Diagnosis not present

## 2019-06-14 NOTE — Assessment & Plan Note (Addendum)
positive ([place]) With prior history of breast implants that were ruptured/replaced, patient had cellulitis LIQ left breast for 1 week, and UOQ asymmetry 1.5 cm, MRI right breast biopsy benign, left breast numerous masses 7 cm skin thickening: 2 biopsies from left breast: Grade 2-3 IDC ER PR positive, HER-2 positive, Ki-67 50% Clinical stage: T4 N0 M0 stage IIIb  Treatment plan 1. Neoadjuvant chemotherapy with [place] Perjeta 6 cyclescompleted 5/6/2020followed by Herceptin/ Perjeta vs Kadcylamaintenance for 1 year 2. Followed byBilmastectomieswith sentinel lymph node study 03/08/2019 3. Followed by adjuvant radiation therapystarted 05/04/2019-06/14/2019 4.Followed by adjuvant antiestrogen therapy ------------------------------------------------------------------------------------------------------------------------------------ 03/08/2019: Bilateral mastectomies: Left mastectomy: Grade 2 IDC spanning 7.5 cm, margins negative, right mastectomy: Benign, ER 95%, PR 5%, HER-2 1+ negative pathology lost the lymph node excision.   Treatment plan: Kadcyla maintenance every 3 weeks Toxicities: 1.Nausea 2.Mild abdominal discomfort: on Nexium 3.Mild thrombocytopenia: Monitoring closely. 4.  Leukopenia and anemia: Dose reduction 5.  Neuropathy sensation of the dorsum of the feet intermittently: Monitoring closely  Denies any diarrhea. Marked improvement in the hemoglobin levels today's hemoglobin is 10.4  Radiationstarted 05/05/2019-05/15/2019 Antiestrogen therapy with anastrozole 1 mg p.o. daily x5 years  Anastrozole counseling:We discussed the risks and benefits of anti-estrogen therapy with aromatase inhibitors. These include but not limited to insomnia, hot flashes, mood changes, vaginal dryness, bone density loss, and weight gain. We strongly believe that the benefits far outweigh the risks. Patient understands these risks and consented to starting treatment. Planned treatment duration  is 5 years.  Return to clinic every 3 weeks with labs and MD visit and follow-up for Kadcyla

## 2019-06-15 ENCOUNTER — Encounter: Payer: Self-pay | Admitting: *Deleted

## 2019-06-15 ENCOUNTER — Encounter: Payer: Self-pay | Admitting: Gastroenterology

## 2019-06-15 ENCOUNTER — Ambulatory Visit (INDEPENDENT_AMBULATORY_CARE_PROVIDER_SITE_OTHER): Payer: Medicare Other | Admitting: Gastroenterology

## 2019-06-15 VITALS — BP 132/76 | HR 71 | Temp 97.8°F | Ht 62.0 in | Wt 148.8 lb

## 2019-06-15 DIAGNOSIS — R1011 Right upper quadrant pain: Secondary | ICD-10-CM

## 2019-06-15 NOTE — Progress Notes (Signed)
Jonathon Bellows MD, MRCP(U.K) [address]  [place]  [place],  [address]  Main: [phone]  Fax: [phone]   Primary Care Physician: [name], MD  Primary Gastroenterologist:  Dr. Jonathon Bellows   Chief Complaint  Patient presents with   Follow-up    abdominal pain     HPI: Katie [name] is a 73 y.o. female   Summary of history :  She underwent underwent a cholecystectomy in 06/2017 and was evaluated for right upper quadrant pain. Despite which continued to have the pain.RUQ post prandial , few times a week, bloating , ,localizedSince it was persistent Dr Lorelee Cover to evaluate for peptic ulcers. RUQ USG 10/2016 was normal with only post operative changes. LFT's normal 10/2016 .EGD on 12/05/16 which showed antral gastritis with negative for H pylori which was confirmed on biopsy.  Since 2018 she did not have any abdominal pain. Diagnosed in January 2020 with breast cancer.  She called our office last week with right upper quadrant pain bloating and some nausea. 09/28/2018: CT scan of the abdomen: Subcapsular small low-attenuation right liver lobe lesion adjacent to gallbladder fossa indeterminant.  MRI recommended. 10/13/2018 MRI liver with contrast: Complex lesion seen in the gallbladder fossa benign etiology favored but surveillance recommended 11/07/2017: Colonoscopy: 3 polyps resected, diverticulosis of the sigmoid colon.  Tubular adenomas x2.    Interval history 05/19/2019-06/15/2019  06/08/2019: MRCP abdomen no abnormality seen   Today she is herefor follow up . No RUQ discomfort since last visit. On a low FODMAP diet which helped. Uses IB guard as needed.  Since her last visit she states that the abdominal pain in the right upper quadrant has significantly improved.  The IBgard and the Bentyl helped significantly.  Denies any diarrhea.  The bloating and abdominal discomfort is also improved significantly.  She is taking her Metformin and  does notice that the symptoms are slightly worse after taking her metformin.   Current Outpatient Medications  Medication Sig Dispense Refill   amLODipine (NORVASC) 10 MG tablet Take 1 tablet (10 mg total) by mouth daily. 90 tablet 3   atorvastatin (LIPITOR) 10 MG tablet TAKE 1 TABLET(10 MG) BY MOUTH DAILY 90 tablet 3   cholecalciferol (VITAMIN D) 400 units TABS tablet Take 400 Units by mouth 2 (two) times daily.     clobetasol (TEMOVATE) 0.05 % external solution Apply 1 application topically 2 (two) times daily as needed (scalp). Reported on 08/16/2015     dexamethasone (DECADRON) 0.1 % ophthalmic suspension Place 1 drop into both eyes 2 (two) times daily. 15 mL 1   dicyclomine (BENTYL) 10 MG capsule Take 1 capsule (10 mg total) by mouth 4 (four) times daily -  before meals and at bedtime. 120 capsule 2   fluticasone (FLONASE) 50 MCG/ACT nasal spray INSTILL 2 SPRAYS INTO EACH NOSTRIL ONCE DAILY 16 g 5   glucose blood test strip USE TO CHECK BLOOD SUGAR DAILY 50 each 12   lidocaine-prilocaine (EMLA) cream Apply to affected area once 30 g 3   loratadine (CLARITIN) 10 MG tablet Take 10 mg by mouth daily.     metFORMIN (GLUCOPHAGE) 500 MG tablet Take 500 mg by mouth at bedtime.     metroNIDAZOLE (METROCREAM) 0.75 % cream Apply 1 application topically 2 (two) times daily as needed (rosacea).      Multiple Vitamin (MULTIVITAMIN WITH MINERALS) TABS tablet Take 1 tablet by mouth daily.     Azelastine HCl 137 MCG/SPRAY SOLN Place 1 spray into  the nose daily as needed.     triamcinolone cream (KENALOG) 0.1 % Apply 1 application topically 2 (two) times daily as needed.     valACYclovir (VALTREX) 500 MG tablet Take 500 mg by mouth 2 (two) times daily as needed.     No current facility-administered medications for this visit.     Allergies as of 06/15/2019 - Review Complete 06/15/2019  Allergen Reaction Noted   Erythromycin Anaphylaxis 06/26/2009   Ciprofloxacin Rash 06/26/2009    Daypro [oxaprozin] Rash 10/13/2012   Enalapril maleate Nausea Only 10/06/2015   Nsaids Nausea Only 10/13/2012   Vioxx [rofecoxib] Nausea Only 10/13/2012    ROS:  General: Negative for anorexia, weight loss, fever, chills, fatigue, weakness. ENT: Negative for hoarseness, difficulty swallowing , nasal congestion. CV: Negative for chest pain, angina, palpitations, dyspnea on exertion, peripheral edema.  Respiratory: Negative for dyspnea at rest, dyspnea on exertion, cough, sputum, wheezing.  GI: See history of present illness. GU:  Negative for dysuria, hematuria, urinary incontinence, urinary frequency, nocturnal urination.  Endo: Negative for unusual weight change.    Physical Examination:   BP 132/76 (BP Location: Right Arm, Patient Position: Sitting)    Pulse 71    Temp 97.8 F (36.6 C) (Oral)    Ht 5\' 2"  (1.575 m)    Wt 148 lb 12.8 oz (67.5 kg)    LMP 08/26/1992 (Approximate)    BMI 27.22 kg/m   General: Well-nourished, well-developed in no acute distress.  Eyes: No icterus. Conjunctivae pink. Mouth: Oropharyngeal mucosa moist and pink , no lesions erythema or exudate. Lungs: Clear to auscultation bilaterally. Non-labored. Heart: Regular rate and rhythm, no murmurs rubs or gallops.  Abdomen: Bowel sounds are normal, nontender, nondistended, no hepatosplenomegaly or masses, no abdominal bruits or hernia , no rebound or guarding.   Extremities: No lower extremity edema. No clubbing or deformities. Neuro: Alert and oriented x 3.  Grossly intact. Skin: Warm and dry, no jaundice.   Psych: Alert and cooperative, normal mood and affect.   Imaging Studies: Mr 3d Recon At Scanner  Result Date: 06/08/2019 CLINICAL DATA:  Right upper quadrant pain. Status post cholecystectomy. EXAM: MRI ABDOMEN WITHOUT AND WITH CONTRAST (INCLUDING MRCP) TECHNIQUE: Multiplanar multisequence MR imaging of the abdomen was performed both before and after the administration of intravenous contrast. Heavily  T2-weighted images of the biliary and pancreatic ducts were obtained, and three-dimensional MRCP images were rendered by post processing. CONTRAST:  33mL GADAVIST GADOBUTROL 1 MMOL/ML IV SOLN COMPARISON:  MRI 10/13/2018 FINDINGS: Lower chest: Unremarkable. Hepatobiliary: No suspicious focal abnormality within the liver parenchyma. The complex cystic lesion at the gallbladder fossa on the previous MRI is not evident on today's exam. Gallbladder surgically absent. No intrahepatic or extrahepatic biliary dilation. No evidence for choledocholithiasis. Pancreas: No focal mass lesion. No dilatation of the main duct. No intraparenchymal cyst. No peripancreatic edema. Assessment limited by motion degradation. Spleen:   No splenomegaly. No focal mass lesion. Adrenals/Urinary Tract: No adrenal nodule or mass. Tiny probable cyst in the interpolar right kidney with additional tiny cortical lesions too small to characterize, specially given the motion degradation. No hydronephrosis. Stomach/Bowel: Stomach is unremarkable. No gastric wall thickening. No evidence of outlet obstruction. Duodenum is normally positioned as is the ligament of Treitz. No small bowel or colonic dilatation within the visualized abdomen Vascular/Lymphatic: No abdominal aortic aneurysm. No abdominal lymphadenopathy. Other:  No intraperitoneal free fluid. Musculoskeletal: No abnormal marrow enhancement within the visualized bony anatomy IMPRESSION: 1. No CT findings to explain the  patient's history of pain. 2. Status post cholecystectomy. No intra or extrahepatic biliary duct dilatation. No choledocholithiasis. Electronically Signed   By: Misty Stanley M.D.   On: 06/08/2019 09:32   Mr Abdomen Mrcp Moise Boring Contast  Result Date: 06/08/2019 CLINICAL DATA:  Right upper quadrant pain. Status post cholecystectomy. EXAM: MRI ABDOMEN WITHOUT AND WITH CONTRAST (INCLUDING MRCP) TECHNIQUE: Multiplanar multisequence MR imaging of the abdomen was performed both before  and after the administration of intravenous contrast. Heavily T2-weighted images of the biliary and pancreatic ducts were obtained, and three-dimensional MRCP images were rendered by post processing. CONTRAST:  32mL GADAVIST GADOBUTROL 1 MMOL/ML IV SOLN COMPARISON:  MRI 10/13/2018 FINDINGS: Lower chest: Unremarkable. Hepatobiliary: No suspicious focal abnormality within the liver parenchyma. The complex cystic lesion at the gallbladder fossa on the previous MRI is not evident on today's exam. Gallbladder surgically absent. No intrahepatic or extrahepatic biliary dilation. No evidence for choledocholithiasis. Pancreas: No focal mass lesion. No dilatation of the main duct. No intraparenchymal cyst. No peripancreatic edema. Assessment limited by motion degradation. Spleen:   No splenomegaly. No focal mass lesion. Adrenals/Urinary Tract: No adrenal nodule or mass. Tiny probable cyst in the interpolar right kidney with additional tiny cortical lesions too small to characterize, specially given the motion degradation. No hydronephrosis. Stomach/Bowel: Stomach is unremarkable. No gastric wall thickening. No evidence of outlet obstruction. Duodenum is normally positioned as is the ligament of Treitz. No small bowel or colonic dilatation within the visualized abdomen Vascular/Lymphatic: No abdominal aortic aneurysm. No abdominal lymphadenopathy. Other:  No intraperitoneal free fluid. Musculoskeletal: No abnormal marrow enhancement within the visualized bony anatomy IMPRESSION: 1. No CT findings to explain the patient's history of pain. 2. Status post cholecystectomy. No intra or extrahepatic biliary duct dilatation. No choledocholithiasis. Electronically Signed   By: Misty Stanley M.D.   On: 06/08/2019 09:32    Assessment and Plan:   Katie [name] is a 73 y.o. y/o female here to follow-up for episodic right upper quadrant pain which has been persistent on and off since 2018. The nature of the pain has some features of  irritable bowel.  It also has some features of her biliary quality of pain.  Although not typical of the same.  She also had an abnormality in the gallbladder fossa in February 2020 .  Repeat MRI of the abdomen in October 2020 showed no abnormality in the gallbladder fossa.  Her symptoms of right upper quadrant discomfort has significantly improved.  She does notice that the symptoms are slightly worse after taking her Metformin which I did explain to her can at times cause gas and bloating and even diarrhea.  I suggested her to continue her Bentyl and IBgard as needed for the discomfort.  If the bloating and gas to recur in the future could consider either a trial of holding the Metformin to see if it gets any better or treating her with a course of Xifaxan at that point of time.Plan  It is also possible that the trastuzumab may be causing the change in bowel habits as it is associated with the same in about 71%.  Symptoms do not seem to be severe enough to warrant discontinuation.  We could try options to continue the medication and help with the symptoms at the same time as it is important for her to continue the same for her breast cancer  Dr Jonathon Bellows  MD,MRCP [facility]) Follow up in as needed

## 2019-06-15 NOTE — Progress Notes (Signed)
Patient Care Team: [name], MD as PCP - General (Family Medicine) Birder [name], MD as Referring Physician (Ophthalmology) [name], MD as Consulting Physician (Dermatology) [name], MD (Inactive) as Consulting Physician (Orthopedic Surgery) [name], MD as Consulting Physician (General Surgery) [name], MD as Consulting Physician (Dentistry) [name], MD as Consulting Physician (General Surgery) Katie Lose, MD as Consulting Physician (Hematology and Oncology) [name], MD as Attending Physician (Radiation Oncology)  DIAGNOSIS:    ICD-10-CM   1. Malignant neoplasm of upper-outer quadrant of left breast in female, estrogen receptor positive ([place])  C50.412    Z17.0     SUMMARY OF ONCOLOGIC HISTORY: Oncology History  Malignant neoplasm of upper-outer quadrant of left breast in female, estrogen receptor positive ([place])  09/14/2018 Initial Diagnosis   With prior history of breast implants that were ruptured/replaced, patient had cellulitis LIQ left breast for 1 week, and UOQ asymmetry 1.5 cm, MRI right breast biopsy benign, left breast numerous masses 7 cm skin thickening: 2 biopsies from left breast: Grade 2-3 IDC ER PR positive, HER-2 positive, Ki-67 50%   09/16/2018 Cancer Staging   Staging form: Breast, AJCC 8th Edition - Clinical stage from 09/16/2018: Stage IIIB (cT4, cN0, cM0, G3, ER+, PR+, HER2+) - Signed by Katie Lose, MD on 09/16/2018   09/29/2018 -  Neo-Adjuvant Chemotherapy   TCHP q 3 weeks   01/19/2019 Breast MRI   Significant interval improvement, the multiple left breast masses and areas of non mass enhancement in all 4 quadrants have decreased in size and extent. The mass previously measuring 1.4 cm with diffuse enhancement now measures 9 mm with heterogeneous areas of enhancement. Current greatest transverse dimension is 5.4 x 3.9 cm, previously 7 cm x 5cm. 2. 4 mm masslike focus of enhancement at the base of the  right nipple is unchanged.   02/10/2019 - 03/23/2019 Chemotherapy   The patient had trastuzumab (HERCEPTIN) 450 mg in sodium chloride 0.9 % 250 mL chemo infusion, 420 mg (100 % of original dose 6 mg/kg), Intravenous,  Once, 2 of 5 cycles Dose modification: 6 mg/kg (original dose 6 mg/kg, Cycle 1, Reason: Provider Judgment) Administration: 450 mg (02/10/2019), 450 mg (03/03/2019)  for chemotherapy treatment.    03/08/2019 Surgery   Bilateral mastectomies [name]: right breast: no malignancy; left breast: IDC, grade 2, scattered over 7.5cm, clear margins.    03/08/2019 Cancer Staging   Staging form: Breast, AJCC 8th Edition - Pathologic stage from 03/08/2019: No Stage Recommended (ypT3, pNX, cM0, G2, ER+, PR+, HER2-) - Signed by [name], MD on 04/21/2019   03/24/2019 -  Chemotherapy   The patient had ado-trastuzumab emtansine (KADCYLA) 260 mg in sodium chloride 0.9 % 250 mL chemo infusion, 3.6 mg/kg = 260 mg, Intravenous, Once, 4 of 15 cycles Dose modification: 3 mg/kg (original dose 3.6 mg/kg, Cycle 5, Reason: Dose not tolerated) Administration: 260 mg (03/24/2019), 260 mg (04/14/2019), 260 mg (05/05/2019), 200 mg (05/26/2019)  for chemotherapy treatment.    05/04/2019 - 06/14/2019 Radiation Therapy   Adjuvant radiation therapy     CHIEF COMPLIANT: Kadcyla maintenance  INTERVAL HISTORY: Katie [name] is a 73 y.o. with above-mentioned history of HER-2 positiveleft breast cancertreated withneoadjuvant chemotherapy, bilateral mastectomies, and completed radiation on 06/14/19. She is currently on adjuvant treatment with Kadcyla maintenance. She presents to the clinic today for treatment.  REVIEW OF SYSTEMS:   Constitutional: Denies fevers, chills or abnormal weight loss Eyes: Denies blurriness of vision Ears, nose, mouth, throat, and face: Denies  mucositis or sore throat Respiratory: Denies cough, dyspnea or wheezes Cardiovascular: Denies palpitation, chest discomfort Gastrointestinal:  Denies nausea, heartburn or change in bowel habits Skin: Denies abnormal skin rashes Lymphatics: Denies new lymphadenopathy or easy bruising Neurological: Denies numbness, tingling or new weaknesses Behavioral/Psych: Mood is stable, no new changes  Extremities: No lower extremity edema Breast: denies any pain or lumps or nodules in either breasts All other systems were reviewed with the patient and are negative.  I have reviewed the past medical history, past surgical history, social history and family history with the patient and they are unchanged from previous note.  ALLERGIES:  is allergic to erythromycin; ciprofloxacin; daypro [oxaprozin]; enalapril maleate; nsaids; and vioxx [rofecoxib].  MEDICATIONS:  Current Outpatient Medications  Medication Sig Dispense Refill  . amLODipine (NORVASC) 10 MG tablet Take 1 tablet (10 mg total) by mouth daily. 90 tablet 3  . atorvastatin (LIPITOR) 10 MG tablet TAKE 1 TABLET(10 MG) BY MOUTH DAILY 90 tablet 3  . Azelastine HCl 137 MCG/SPRAY SOLN Place 1 spray into the nose daily as needed.    . cholecalciferol (VITAMIN D) 400 units TABS tablet Take 400 Units by mouth 2 (two) times daily.    . clobetasol (TEMOVATE) 0.05 % external solution Apply 1 application topically 2 (two) times daily as needed (scalp). Reported on 08/16/2015    . dexamethasone (DECADRON) 0.1 % ophthalmic suspension Place 1 drop into both eyes 2 (two) times daily. 15 mL 1  . dicyclomine (BENTYL) 10 MG capsule Take 1 capsule (10 mg total) by mouth 4 (four) times daily -  before meals and at bedtime. 120 capsule 2  . fluticasone (FLONASE) 50 MCG/ACT nasal spray INSTILL 2 SPRAYS INTO EACH NOSTRIL ONCE DAILY 16 g 5  . glucose blood test strip USE TO CHECK BLOOD SUGAR DAILY 50 each 12  . lidocaine-prilocaine (EMLA) cream Apply to affected area once 30 g 3  . loratadine (CLARITIN) 10 MG tablet Take 10 mg by mouth daily.    . metFORMIN (GLUCOPHAGE) 500 MG tablet Take 500 mg by mouth at  bedtime.    . metroNIDAZOLE (METROCREAM) 0.75 % cream Apply 1 application topically 2 (two) times daily as needed (rosacea).     . Multiple Vitamin (MULTIVITAMIN WITH MINERALS) TABS tablet Take 1 tablet by mouth daily.    [name] triamcinolone cream (KENALOG) 0.1 % Apply 1 application topically 2 (two) times daily as needed.    . valACYclovir (VALTREX) 500 MG tablet Take 500 mg by mouth 2 (two) times daily as needed.     No current facility-administered medications for this visit.     PHYSICAL EXAMINATION: ECOG PERFORMANCE STATUS: 1 - Symptomatic but completely ambulatory  Vitals:   06/16/19 0823  BP: (!) 150/65  Pulse: 70  Resp: 17  Temp: 97.8 F (36.6 C)  SpO2: 98%   Filed Weights   06/16/19 0823  Weight: 147 lb 11.2 oz (67 kg)    GENERAL: alert, no distress and comfortable SKIN: skin color, texture, turgor are normal, no rashes or significant lesions EYES: normal, Conjunctiva are pink and non-injected, sclera clear OROPHARYNX: no exudate, no erythema and lips, buccal mucosa, and tongue normal  NECK: supple, thyroid normal size, non-tender, without nodularity LYMPH: no palpable lymphadenopathy in the cervical, axillary or inguinal LUNGS: clear to auscultation and percussion with normal breathing effort HEART: regular rate & rhythm and no murmurs and no lower extremity edema ABDOMEN: abdomen soft, non-tender and normal bowel sounds MUSCULOSKELETAL: no cyanosis of digits and no  clubbing  NEURO: alert & oriented x 3 with fluent speech, no focal motor/sensory deficits EXTREMITIES: No lower extremity edema  LABORATORY DATA:  I have reviewed the data as listed CMP Latest Ref Rng & Units 05/26/2019 05/05/2019 04/27/2019  Glucose 70 - 99 mg/dL 116(H) 108(H) 136(H)  BUN 8 - 23 mg/dL 15 18 16   Creatinine 0.44 - 1.00 mg/dL 0.77 0.83 0.76  Sodium 135 - 145 mmol/L 140 142 141  Potassium 3.5 - 5.1 mmol/L 3.8 3.8 3.6  Chloride 98 - 111 mmol/L 103 104 101  CO2 22 - 32 mmol/L 26 25 31   Calcium  8.9 - 10.3 mg/dL 9.5 9.7 9.6  Total Protein 6.5 - 8.1 g/dL 7.4 7.4 7.0  Total Bilirubin 0.3 - 1.2 mg/dL 0.3 0.4 0.5  Alkaline Phos 38 - 126 U/L 87 81 74  AST 15 - 41 U/L 27 27 35  ALT 0 - 44 U/L 31 27 35    Lab Results  Component Value Date   WBC 2.9 (L) 06/16/2019   HGB 10.3 (L) 06/16/2019   HCT 30.3 (L) 06/16/2019   MCV 99.3 06/16/2019   PLT 97 (L) 06/16/2019   NEUTROABS 2.0 06/16/2019    ASSESSMENT & PLAN:  Malignant neoplasm of upper-outer quadrant of left breast in female, estrogen receptor positive ([name]) positive ([place]) With prior history of breast implants that were ruptured/replaced, patient had cellulitis LIQ left breast for 1 week, and UOQ asymmetry 1.5 cm, MRI right breast biopsy benign, left breast numerous masses 7 cm skin thickening: 2 biopsies from left breast: Grade 2-3 IDC ER PR positive, HER-2 positive, Ki-67 50% Clinical stage: T4 N0 M0 stage IIIb  Treatment plan 1. Neoadjuvant chemotherapy with [place] Perjeta 6 cyclescompleted 5/6/2020followed by Herceptin/ Perjeta vs Kadcylamaintenance for 1 year 2. Followed byBilmastectomieswith sentinel lymph node study 03/08/2019 3. Followed by adjuvant radiation therapystarted 05/04/2019-06/14/2019 4.Followed by adjuvant antiestrogen therapy ------------------------------------------------------------------------------------------------------------------------------------ 03/08/2019: Bilateral mastectomies: Left mastectomy: Grade 2 IDC spanning 7.5 cm, margins negative, right mastectomy: Benign, ER 95%, PR 5%, HER-2 1+ negative pathology lost the lymph node excision.   Treatment plan: Kadcyla maintenance every 3 weeks Toxicities: 1.Nausea: Improved after medications prescribed by gastroenterology. 2.Mild abdominal discomfort: on Nexium 3.Mild thrombocytopenia: Monitoring closely. 4.  Leukopenia and anemia: Dose reduction, stable counts now. 5.  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 Marked improvement in the hemoglobin levels today's hemoglobin is 10.3  Radiationstarted 05/05/2019-05/15/2019 Antiestrogen therapy with anastrozole 1 mg p.o. daily x5 years  Anastrozole counseling:We discussed the risks and benefits of anti-estrogen therapy with aromatase inhibitors. These include but not limited to insomnia, hot flashes, mood changes, vaginal dryness, bone density loss, and weight gain. We strongly believe that the benefits far outweigh the risks. Patient understands these risks and consented to starting treatment. Planned treatment duration is 5 years.  Return to clinic every 3 weeks with labs and MD visit and follow-up for Kadcyla    No orders of the defined types were placed in this encounter.  The patient has a good understanding of the overall plan. she agrees with it. she will call with any problems that may develop before the next visit here.  Katie Lose, MD 06/16/2019  Katie [name] am acting as scribe for Dr. Nicholas [name].  I have reviewed the above documentation for accuracy and completeness, and I agree with the above.

## 2019-06-16 ENCOUNTER — Other Ambulatory Visit: Payer: Self-pay

## 2019-06-16 ENCOUNTER — Inpatient Hospital Stay (HOSPITAL_BASED_OUTPATIENT_CLINIC_OR_DEPARTMENT_OTHER): Payer: Medicare Other | Admitting: Hematology and Oncology

## 2019-06-16 ENCOUNTER — Inpatient Hospital Stay: Payer: Medicare Other

## 2019-06-16 ENCOUNTER — Inpatient Hospital Stay: Payer: Medicare Other | Attending: Hematology and Oncology

## 2019-06-16 ENCOUNTER — Other Ambulatory Visit: Payer: Self-pay | Admitting: *Deleted

## 2019-06-16 VITALS — BP 135/66 | HR 73 | Resp 18

## 2019-06-16 DIAGNOSIS — Z923 Personal history of irradiation: Secondary | ICD-10-CM | POA: Diagnosis not present

## 2019-06-16 DIAGNOSIS — Z7984 Long term (current) use of oral hypoglycemic drugs: Secondary | ICD-10-CM | POA: Diagnosis not present

## 2019-06-16 DIAGNOSIS — Z5181 Encounter for therapeutic drug level monitoring: Secondary | ICD-10-CM

## 2019-06-16 DIAGNOSIS — Z170 Estrogen receptor positive status [ER+]: Secondary | ICD-10-CM

## 2019-06-16 DIAGNOSIS — E785 Hyperlipidemia, unspecified: Secondary | ICD-10-CM | POA: Diagnosis not present

## 2019-06-16 DIAGNOSIS — I10 Essential (primary) hypertension: Secondary | ICD-10-CM | POA: Insufficient documentation

## 2019-06-16 DIAGNOSIS — C50412 Malignant neoplasm of upper-outer quadrant of left female breast: Secondary | ICD-10-CM

## 2019-06-16 DIAGNOSIS — E119 Type 2 diabetes mellitus without complications: Secondary | ICD-10-CM | POA: Diagnosis not present

## 2019-06-16 DIAGNOSIS — Z79899 Other long term (current) drug therapy: Secondary | ICD-10-CM | POA: Insufficient documentation

## 2019-06-16 DIAGNOSIS — Z5112 Encounter for antineoplastic immunotherapy: Secondary | ICD-10-CM | POA: Insufficient documentation

## 2019-06-16 DIAGNOSIS — Z95828 Presence of other vascular implants and grafts: Secondary | ICD-10-CM

## 2019-06-16 DIAGNOSIS — Z9882 Breast implant status: Secondary | ICD-10-CM | POA: Insufficient documentation

## 2019-06-16 LAB — CMP (CANCER CENTER ONLY)
ALT: 32 U/L (ref 0–44)
AST: 24 U/L (ref 15–41)
Albumin: 3.6 g/dL (ref 3.5–5.0)
Alkaline Phosphatase: 98 U/L (ref 38–126)
Anion gap: 14 (ref 5–15)
BUN: 17 mg/dL (ref 8–23)
CO2: 23 mmol/L (ref 22–32)
Calcium: 9.5 mg/dL (ref 8.9–10.3)
Chloride: 105 mmol/L (ref 98–111)
Creatinine: 0.77 mg/dL (ref 0.44–1.00)
GFR, Est AFR Am: >60 mL/min (ref >60)
GFR, Estimated: >60 mL/min (ref >60)
Glucose, Bld: 120 mg/dL — ABNORMAL HIGH (ref 70–99)
Potassium: 3.7 mmol/L (ref 3.5–5.1)
Sodium: 142 mmol/L (ref 135–145)
Total Bilirubin: 0.4 mg/dL (ref 0.3–1.2)
Total Protein: 7.5 g/dL (ref 6.5–8.1)

## 2019-06-16 LAB — CBC WITH DIFFERENTIAL (CANCER CENTER ONLY)
Abs Immature Granulocytes: 0.01 10*3/uL (ref 0.00–0.07)
Basophils Absolute: 0 10*3/uL (ref 0.0–0.1)
Basophils Relative: 0 %
Eosinophils Absolute: 0.1 10*3/uL (ref 0.0–0.5)
Eosinophils Relative: 3 %
HCT: 30.3 % — ABNORMAL LOW (ref 36.0–46.0)
Hemoglobin: 10.3 g/dL — ABNORMAL LOW (ref 12.0–15.0)
Immature Granulocytes: 0 %
Lymphocytes Relative: 16 %
Lymphs Abs: 0.5 10*3/uL — ABNORMAL LOW (ref 0.7–4.0)
MCH: 33.8 pg (ref 26.0–34.0)
MCHC: 34 g/dL (ref 30.0–36.0)
MCV: 99.3 fL (ref 80.0–100.0)
Monocytes Absolute: 0.3 10*3/uL (ref 0.1–1.0)
Monocytes Relative: 12 %
Neutro Abs: 2 10*3/uL (ref 1.7–7.7)
Neutrophils Relative %: 69 %
Platelet Count: 97 10*3/uL — ABNORMAL LOW (ref 150–400)
RBC: 3.05 MIL/uL — ABNORMAL LOW (ref 3.87–5.11)
RDW: 15 % (ref 11.5–15.5)
WBC Count: 2.9 10*3/uL — ABNORMAL LOW (ref 4.0–10.5)
nRBC: 0 % (ref 0.0–0.2)

## 2019-06-16 MED ORDER — ANASTROZOLE 1 MG PO TABS: 1.0000 mg | ORAL_TABLET | Freq: Every day | ORAL | 3 refills | Status: DC

## 2019-06-16 MED ORDER — SODIUM CHLORIDE 0.9 % IV SOLN
Freq: Once | INTRAVENOUS | Status: AC
  Administered 2019-06-16: 09:00:00 via INTRAVENOUS
  Filled 2019-06-16: qty 250

## 2019-06-16 MED ORDER — HEPARIN SOD (PORK) LOCK FLUSH 100 UNIT/ML IV SOLN
500.0000 [IU] | Freq: Once | INTRAVENOUS | Status: AC | PRN
  Administered 2019-06-16: 10:00:00 500 [IU]
  Filled 2019-06-16: qty 5

## 2019-06-16 MED ORDER — SODIUM CHLORIDE 0.9 % IV SOLN
3.0000 mg/kg | Freq: Once | INTRAVENOUS | Status: AC
  Administered 2019-06-16: 09:00:00 200 mg via INTRAVENOUS
  Filled 2019-06-16: qty 10

## 2019-06-16 MED ORDER — ACETAMINOPHEN 325 MG PO TABS
ORAL_TABLET | ORAL | Status: AC
  Filled 2019-06-16: qty 2

## 2019-06-16 MED ORDER — ACETAMINOPHEN 325 MG PO TABS
650.0000 mg | ORAL_TABLET | Freq: Once | ORAL | Status: AC
  Administered 2019-06-16: 650 mg via ORAL

## 2019-06-16 MED ORDER — SODIUM CHLORIDE 0.9% FLUSH
10.0000 mL | INTRAVENOUS | Status: DC | PRN
  Administered 2019-06-16: 10 mL
  Filled 2019-06-16: qty 10

## 2019-06-16 MED ORDER — DIPHENHYDRAMINE HCL 25 MG PO CAPS
50.0000 mg | ORAL_CAPSULE | Freq: Once | ORAL | Status: AC
  Administered 2019-06-16: 50 mg via ORAL

## 2019-06-16 MED ORDER — DIPHENHYDRAMINE HCL 25 MG PO CAPS
ORAL_CAPSULE | ORAL | Status: AC
  Filled 2019-06-16: qty 2

## 2019-06-16 MED ORDER — SODIUM CHLORIDE 0.9% FLUSH
10.0000 mL | Freq: Once | INTRAVENOUS | Status: AC
  Administered 2019-06-16: 10 mL
  Filled 2019-06-16: qty 10

## 2019-06-16 NOTE — Progress Notes (Signed)
Patient declined to remain for 30 minute post Kadcyla observations period. Patient remained for 15 minutes and was then discharged per her request.

## 2019-06-16 NOTE — Progress Notes (Signed)
Per Dr. Lindi Adie it is ok to treat today with platelet count of 97

## 2019-06-16 NOTE — Patient Instructions (Signed)
[facility] Discharge Instructions for Patients Receiving Chemotherapy  Today you received the following chemotherapy agents Kadcyla  To help prevent nausea and vomiting after your treatment, we encourage you to take your nausea medication as directed   If you develop nausea and vomiting that is not controlled by your nausea medication, call the clinic.   BELOW ARE SYMPTOMS THAT SHOULD BE REPORTED IMMEDIATELY:  *FEVER GREATER THAN 100.5 F  *CHILLS WITH OR WITHOUT FEVER  NAUSEA AND VOMITING THAT IS NOT CONTROLLED WITH YOUR NAUSEA MEDICATION  *UNUSUAL SHORTNESS OF BREATH  *UNUSUAL BRUISING OR BLEEDING  TENDERNESS IN MOUTH AND THROAT WITH OR WITHOUT PRESENCE OF ULCERS  *URINARY PROBLEMS  *BOWEL PROBLEMS  UNUSUAL RASH Items with * indicate a potential emergency and should be followed up as soon as possible.  Feel free to call the clinic should you have any questions or concerns. The clinic phone number is [phone].  Please show the CHEMO ALERT CARD at check-in to the Emergency Department and triage nurse.   

## 2019-06-17 ENCOUNTER — Telehealth: Payer: Self-pay | Admitting: Hematology and Oncology

## 2019-06-17 NOTE — Telephone Encounter (Signed)
I talk with patient regarding schedule  

## 2019-06-22 ENCOUNTER — Ambulatory Visit (HOSPITAL_COMMUNITY)
Admission: RE | Admit: 2019-06-22 | Discharge: 2019-06-22 | Disposition: A | Discharge status: Home / Self Care | Payer: Medicare Other | Source: Ambulatory Visit | Attending: Hematology and Oncology | Admitting: Hematology and Oncology

## 2019-06-22 ENCOUNTER — Other Ambulatory Visit: Payer: Self-pay

## 2019-06-22 DIAGNOSIS — I083 Combined rheumatic disorders of mitral, aortic and tricuspid valves: Secondary | ICD-10-CM | POA: Insufficient documentation

## 2019-06-22 DIAGNOSIS — Z79899 Other long term (current) drug therapy: Secondary | ICD-10-CM | POA: Diagnosis not present

## 2019-06-22 DIAGNOSIS — Z5181 Encounter for therapeutic drug level monitoring: Secondary | ICD-10-CM | POA: Diagnosis not present

## 2019-06-22 NOTE — Progress Notes (Signed)
  Echocardiogram 2D Echocardiogram has been performed.  Katie [name] 06/22/2019, 10:52 AM

## 2019-06-23 ENCOUNTER — Encounter: Payer: Self-pay | Admitting: Hematology and Oncology

## 2019-07-05 NOTE — Telephone Encounter (Signed)
Bone Density results, from 09/10/2018, mailed to patient as instructed by Dr. Diona Browner.

## 2019-07-06 ENCOUNTER — Other Ambulatory Visit: Payer: Self-pay | Admitting: Hematology and Oncology

## 2019-07-06 ENCOUNTER — Telehealth: Payer: Self-pay | Admitting: Hematology and Oncology

## 2019-07-06 DIAGNOSIS — Z8249 Family history of ischemic heart disease and other diseases of the circulatory system: Secondary | ICD-10-CM | POA: Diagnosis not present

## 2019-07-06 DIAGNOSIS — C50919 Malignant neoplasm of unspecified site of unspecified female breast: Secondary | ICD-10-CM | POA: Diagnosis not present

## 2019-07-06 DIAGNOSIS — Z9221 Personal history of antineoplastic chemotherapy: Secondary | ICD-10-CM | POA: Diagnosis not present

## 2019-07-06 DIAGNOSIS — Z79899 Other long term (current) drug therapy: Secondary | ICD-10-CM | POA: Diagnosis not present

## 2019-07-06 DIAGNOSIS — R519 Headache, unspecified: Secondary | ICD-10-CM | POA: Diagnosis not present

## 2019-07-06 DIAGNOSIS — I671 Cerebral aneurysm, nonruptured: Secondary | ICD-10-CM | POA: Diagnosis not present

## 2019-07-06 NOTE — Progress Notes (Signed)
Patient Care Team: [name], MD as PCP - General (Family Medicine) Birder [name], MD as Referring Physician (Ophthalmology) [name], MD as Consulting Physician (Dermatology) [name], MD (Inactive) as Consulting Physician (Orthopedic Surgery) [name], MD as Consulting Physician (General Surgery) [name], MD as Consulting Physician (Dentistry) [name], MD as Consulting Physician (General Surgery) [name], MD as Consulting Physician (Hematology and Oncology) [name], MD as Attending Physician (Radiation Oncology)  DIAGNOSIS:    ICD-10-CM   1. Malignant neoplasm of upper-outer quadrant of left breast in female, estrogen receptor positive ([place])  C50.412    Z17.0     SUMMARY OF ONCOLOGIC HISTORY: Oncology History  Malignant neoplasm of upper-outer quadrant of left breast in female, estrogen receptor positive ([place])  09/14/2018 Initial Diagnosis   With prior history of breast implants that were ruptured/replaced, patient had cellulitis LIQ left breast for 1 week, and UOQ asymmetry 1.5 cm, MRI right breast biopsy benign, left breast numerous masses 7 cm skin thickening: 2 biopsies from left breast: Grade 2-3 IDC ER PR positive, HER-2 positive, Ki-67 50%   09/16/2018 Cancer Staging   Staging form: Breast, AJCC 8th Edition - Clinical stage from 09/16/2018: Stage IIIB (cT4, cN0, cM0, G3, ER+, PR+, HER2+) - Signed by [name], MD on 09/16/2018   09/29/2018 -  Neo-Adjuvant Chemotherapy   TCHP q 3 weeks   01/19/2019 Breast MRI   Significant interval improvement, the multiple left breast masses and areas of non mass enhancement in all 4 quadrants have decreased in size and extent. The mass previously measuring 1.4 cm with diffuse enhancement now measures 9 mm with heterogeneous areas of enhancement. Current greatest transverse dimension is 5.4 x 3.9 cm, previously 7 cm x 5cm. 2. 4 mm masslike focus of enhancement at the base of the  right nipple is unchanged.   02/10/2019 - 03/23/2019 Chemotherapy   The patient had trastuzumab (HERCEPTIN) 450 mg in sodium chloride 0.9 % 250 mL chemo infusion, 420 mg (100 % of original dose 6 mg/kg), Intravenous,  Once, 2 of 5 cycles Dose modification: 6 mg/kg (original dose 6 mg/kg, Cycle 1, Reason: Provider Judgment) Administration: 450 mg (02/10/2019), 450 mg (03/03/2019)  for chemotherapy treatment.    03/08/2019 Surgery   Bilateral mastectomies [name]: right breast: no malignancy; left breast: IDC, grade 2, scattered over 7.5cm, clear margins.    03/08/2019 Cancer Staging   Staging form: Breast, AJCC 8th Edition - Pathologic stage from 03/08/2019: No Stage Recommended (ypT3, pNX, cM0, G2, ER+, PR+, HER2-) - Signed by [name], MD on 04/21/2019   03/24/2019 -  Chemotherapy   The patient had ado-trastuzumab emtansine (KADCYLA) 260 mg in sodium chloride 0.9 % 250 mL chemo infusion, 3.6 mg/kg = 260 mg, Intravenous, Once, 5 of 10 cycles Dose modification: 3 mg/kg (original dose 3.6 mg/kg, Cycle 5, Reason: Dose not tolerated) Administration: 260 mg (03/24/2019), 260 mg (04/14/2019), 200 mg (06/16/2019), 260 mg (05/05/2019), 200 mg (05/26/2019)  for chemotherapy treatment.    05/04/2019 - 06/14/2019 Radiation Therapy   Adjuvant radiation therapy     CHIEF COMPLIANT: Kadcyla maintenance  INTERVAL HISTORY: Katie [name] is a 73 y.o. with above-mentioned history of HER-2 positiveleft breast cancertreated withneoadjuvant chemotherapy, bilateral mastectomies, radiation, and who is currently on adjuvant treatment with Kadcyla maintenance. Echo on 06/22/19 showed an ejection fraction of 60-65%.She presents to the clinic todayfor treatment. She noticed slight increase in neuropathy Tolerating anastrozole well  REVIEW OF SYSTEMS:   Constitutional: Denies fevers, chills  or abnormal weight loss Eyes: Denies blurriness of vision Ears, nose, mouth, throat, and face: Denies mucositis or sore  throat Respiratory: Denies cough, dyspnea or wheezes Cardiovascular: Denies palpitation, chest discomfort Gastrointestinal: Denies nausea, heartburn or change in bowel habits Skin: Denies abnormal skin rashes Lymphatics: Denies new lymphadenopathy or easy bruising Neurological: Neuropathy Behavioral/Psych: Mood is stable, no new changes  Extremities: No lower extremity edema Breast: denies any pain or lumps or nodules in either breasts All other systems were reviewed with the patient and are negative.  I have reviewed the past medical history, past surgical history, social history and family history with the patient and they are unchanged from previous note.  ALLERGIES:  is allergic to erythromycin; ciprofloxacin; daypro [oxaprozin]; enalapril maleate; nsaids; and vioxx [rofecoxib].  MEDICATIONS:  Current Outpatient Medications  Medication Sig Dispense Refill  . amLODipine (NORVASC) 10 MG tablet Take 1 tablet (10 mg total) by mouth daily. 90 tablet 3  . anastrozole (ARIMIDEX) 1 MG tablet Take 1 tablet (1 mg total) by mouth daily. 90 tablet 3  . atorvastatin (LIPITOR) 10 MG tablet TAKE 1 TABLET(10 MG) BY MOUTH DAILY 90 tablet 3  . Azelastine HCl 137 MCG/SPRAY SOLN Place 1 spray into the nose daily as needed.    . cholecalciferol (VITAMIN D) 400 units TABS tablet Take 400 Units by mouth 2 (two) times daily.    . clobetasol (TEMOVATE) 0.05 % external solution Apply 1 application topically 2 (two) times daily as needed (scalp). Reported on 08/16/2015    . dexamethasone (DECADRON) 0.1 % ophthalmic suspension Place 1 drop into both eyes 2 (two) times daily. 15 mL 1  . dicyclomine (BENTYL) 10 MG capsule Take 1 capsule (10 mg total) by mouth 4 (four) times daily -  before meals and at bedtime. 120 capsule 2  . fluticasone (FLONASE) 50 MCG/ACT nasal spray INSTILL 2 SPRAYS INTO EACH NOSTRIL ONCE DAILY 16 g 5  . glucose blood test strip USE TO CHECK BLOOD SUGAR DAILY 50 each 12  .  lidocaine-prilocaine (EMLA) cream Apply to affected area once 30 g 3  . loratadine (CLARITIN) 10 MG tablet Take 10 mg by mouth daily.    . metFORMIN (GLUCOPHAGE) 500 MG tablet Take 500 mg by mouth at bedtime.    . metroNIDAZOLE (METROCREAM) 0.75 % cream Apply 1 application topically 2 (two) times daily as needed (rosacea).     . Multiple Vitamin (MULTIVITAMIN WITH MINERALS) TABS tablet Take 1 tablet by mouth daily.    [name] triamcinolone cream (KENALOG) 0.1 % Apply 1 application topically 2 (two) times daily as needed.    . valACYclovir (VALTREX) 500 MG tablet Take 500 mg by mouth 2 (two) times daily as needed.     No current facility-administered medications for this visit.     PHYSICAL EXAMINATION: ECOG PERFORMANCE STATUS: 1 - Symptomatic but completely ambulatory  Vitals:   07/07/19 0846  BP: (!) 141/62  Pulse: 70  Resp: 19  Temp: 98.9 F (37.2 C)  SpO2: 98%   Filed Weights   07/07/19 0846  Weight: 150 lb 11.2 oz (68.4 kg)    GENERAL: alert, no distress and comfortable SKIN: skin color, texture, turgor are normal, no rashes or significant lesions EYES: normal, Conjunctiva are pink and non-injected, sclera clear OROPHARYNX: no exudate, no erythema and lips, buccal mucosa, and tongue normal  NECK: supple, thyroid normal size, non-tender, without nodularity LYMPH: no palpable lymphadenopathy in the cervical, axillary or inguinal LUNGS: clear to auscultation and percussion with normal  breathing effort HEART: regular rate & rhythm and no murmurs and no lower extremity edema ABDOMEN: abdomen soft, non-tender and normal bowel sounds MUSCULOSKELETAL: no cyanosis of digits and no clubbing  NEURO: alert & oriented x 3 with fluent speech, no focal motor/sensory deficits EXTREMITIES: No lower extremity edema  LABORATORY DATA:  I have reviewed the data as listed CMP Latest Ref Rng & Units 06/16/2019 05/26/2019 05/05/2019  Glucose 70 - 99 mg/dL 120(H) 116(H) 108(H)  BUN 8 - 23 mg/dL 17 15  18   Creatinine 0.44 - 1.00 mg/dL 0.77 0.77 0.83  Sodium 135 - 145 mmol/L 142 140 142  Potassium 3.5 - 5.1 mmol/L 3.7 3.8 3.8  Chloride 98 - 111 mmol/L 105 103 104  CO2 22 - 32 mmol/L 23 26 25   Calcium 8.9 - 10.3 mg/dL 9.5 9.5 9.7  Total Protein 6.5 - 8.1 g/dL 7.5 7.4 7.4  Total Bilirubin 0.3 - 1.2 mg/dL 0.4 0.3 0.4  Alkaline Phos 38 - 126 U/L 98 87 81  AST 15 - 41 U/L 24 27 27   ALT 0 - 44 U/L 32 31 27    Lab Results  Component Value Date   WBC 3.3 (L) 07/07/2019   HGB 10.0 (L) 07/07/2019   HCT 30.0 (L) 07/07/2019   MCV 100.0 07/07/2019   PLT 86 (L) 07/07/2019   NEUTROABS 2.2 07/07/2019    ASSESSMENT & PLAN:  Malignant neoplasm of upper-outer quadrant of left breast in female, estrogen receptor positive ([place]) With prior history of breast implants that were ruptured/replaced, patient had cellulitis LIQ left breast for 1 week, and UOQ asymmetry 1.5 cm, MRI right breast biopsy benign, left breast numerous masses 7 cm skin thickening: 2 biopsies from left breast: Grade 2-3 IDC ER PR positive, HER-2 positive, Ki-67 50% Clinical stage: T4 N0 M0 stage IIIb  Treatment plan 1. Neoadjuvant chemotherapy with [place] Perjeta 6 cyclescompleted 5/6/2020followed by Herceptin/ Perjeta vs Kadcylamaintenance for 1 year 2. Followed byBilmastectomieswith sentinel lymph node study 03/08/2019 3. Followed by adjuvant radiation therapystarted 05/04/2019-06/14/2019 4.Followed by adjuvant antiestrogen therapy ------------------------------------------------------------------------------------------------------------------------------------ 03/08/2019: Bilateral mastectomies: Left mastectomy: Grade 2 IDC spanning 7.5 cm, margins negative, right mastectomy: Benign, ER 95%, PR 5%, HER-2 1+ negative pathology lost the lymph node excision.   Treatment plan: Kadcyla maintenance every 3 weeks Toxicities: 1.Nausea: Improved after medications prescribed by gastroenterology. 2.Mild abdominal discomfort:  on Nexium 3.Mild thrombocytopenia: Monitoring closely. 4.Leukopenia and anemia: Dose reduction, stable counts now. 5.Neuropathy sensation of the dorsum of the feet intermittently: Monitoring closely  Denies any diarrhea. Markedimprovement in the hemoglobin levels today's hemoglobin is10  Radiationstarted 05/05/2019-05/15/2019 Antiestrogen therapy with anastrozole 1 mg p.o. daily x5 years  Anastrozole toxicities: None  Brain Aneurysm: [facility] at [facility]. She needs surgery which will be done in March 2021  Return to clinic every 3 weeks with labs and MD visit and follow-up for Kadcyla    No orders of the defined types were placed in this encounter.  The patient has a good understanding of the overall plan. she agrees with it. she will call with any problems that may develop before the next visit here.  [name], MD 07/07/2019  Julious Oka Dorshimer am acting as scribe for Dr. [name].  I have reviewed the above documentation for accuracy and completeness, and I agree with the above.

## 2019-07-06 NOTE — Telephone Encounter (Signed)
I spoke to Dr. Lerry Liner neuro surgeon who has been following the patient for an aneurysm in the brain.  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 Given the fact that she is on chemotherapy and radiation, we felt that it is safe for her to wait till chemotherapy is complete to undergo surgery on the aneurysm. Dr. Patsey Berthold cell phone number is [phone] Their office number is [phone]

## 2019-07-07 ENCOUNTER — Inpatient Hospital Stay: Payer: Medicare Other | Attending: Hematology and Oncology

## 2019-07-07 ENCOUNTER — Inpatient Hospital Stay (HOSPITAL_BASED_OUTPATIENT_CLINIC_OR_DEPARTMENT_OTHER): Payer: Medicare Other | Admitting: Hematology and Oncology

## 2019-07-07 ENCOUNTER — Inpatient Hospital Stay: Payer: Medicare Other

## 2019-07-07 ENCOUNTER — Other Ambulatory Visit: Payer: Self-pay

## 2019-07-07 VITALS — BP 157/61 | HR 74 | Temp 97.8°F | Resp 16

## 2019-07-07 DIAGNOSIS — E119 Type 2 diabetes mellitus without complications: Secondary | ICD-10-CM | POA: Diagnosis not present

## 2019-07-07 DIAGNOSIS — Z7984 Long term (current) use of oral hypoglycemic drugs: Secondary | ICD-10-CM | POA: Insufficient documentation

## 2019-07-07 DIAGNOSIS — Z5112 Encounter for antineoplastic immunotherapy: Secondary | ICD-10-CM | POA: Insufficient documentation

## 2019-07-07 DIAGNOSIS — C50412 Malignant neoplasm of upper-outer quadrant of left female breast: Secondary | ICD-10-CM

## 2019-07-07 DIAGNOSIS — Z170 Estrogen receptor positive status [ER+]: Secondary | ICD-10-CM

## 2019-07-07 DIAGNOSIS — Z923 Personal history of irradiation: Secondary | ICD-10-CM | POA: Insufficient documentation

## 2019-07-07 DIAGNOSIS — Z9882 Breast implant status: Secondary | ICD-10-CM | POA: Insufficient documentation

## 2019-07-07 DIAGNOSIS — I10 Essential (primary) hypertension: Secondary | ICD-10-CM | POA: Diagnosis not present

## 2019-07-07 DIAGNOSIS — Z79899 Other long term (current) drug therapy: Secondary | ICD-10-CM | POA: Insufficient documentation

## 2019-07-07 DIAGNOSIS — G620 Drug-induced polyneuropathy: Secondary | ICD-10-CM | POA: Insufficient documentation

## 2019-07-07 DIAGNOSIS — E785 Hyperlipidemia, unspecified: Secondary | ICD-10-CM | POA: Diagnosis not present

## 2019-07-07 DIAGNOSIS — Z95828 Presence of other vascular implants and grafts: Secondary | ICD-10-CM

## 2019-07-07 LAB — CMP (CANCER CENTER ONLY)
ALT: 32 U/L (ref 0–44)
AST: 29 U/L (ref 15–41)
Albumin: 3.7 g/dL (ref 3.5–5.0)
Alkaline Phosphatase: 110 U/L (ref 38–126)
Anion gap: 11 (ref 5–15)
BUN: 18 mg/dL (ref 8–23)
CO2: 26 mmol/L (ref 22–32)
Calcium: 9.2 mg/dL (ref 8.9–10.3)
Chloride: 104 mmol/L (ref 98–111)
Creatinine: 0.83 mg/dL (ref 0.44–1.00)
GFR, Est AFR Am: >60 mL/min (ref >60)
GFR, Estimated: >60 mL/min (ref >60)
Glucose, Bld: 122 mg/dL — ABNORMAL HIGH (ref 70–99)
Potassium: 3.7 mmol/L (ref 3.5–5.1)
Sodium: 141 mmol/L (ref 135–145)
Total Bilirubin: 0.5 mg/dL (ref 0.3–1.2)
Total Protein: 7.2 g/dL (ref 6.5–8.1)

## 2019-07-07 LAB — CBC WITH DIFFERENTIAL (CANCER CENTER ONLY)
Abs Immature Granulocytes: 0.01 10*3/uL (ref 0.00–0.07)
Basophils Absolute: 0 10*3/uL (ref 0.0–0.1)
Basophils Relative: 0 %
Eosinophils Absolute: 0.1 10*3/uL (ref 0.0–0.5)
Eosinophils Relative: 3 %
HCT: 30 % — ABNORMAL LOW (ref 36.0–46.0)
Hemoglobin: 10 g/dL — ABNORMAL LOW (ref 12.0–15.0)
Immature Granulocytes: 0 %
Lymphocytes Relative: 18 %
Lymphs Abs: 0.6 10*3/uL — ABNORMAL LOW (ref 0.7–4.0)
MCH: 33.3 pg (ref 26.0–34.0)
MCHC: 33.3 g/dL (ref 30.0–36.0)
MCV: 100 fL (ref 80.0–100.0)
Monocytes Absolute: 0.4 10*3/uL (ref 0.1–1.0)
Monocytes Relative: 12 %
Neutro Abs: 2.2 10*3/uL (ref 1.7–7.7)
Neutrophils Relative %: 67 %
Platelet Count: 86 10*3/uL — ABNORMAL LOW (ref 150–400)
RBC: 3 MIL/uL — ABNORMAL LOW (ref 3.87–5.11)
RDW: 16 % — ABNORMAL HIGH (ref 11.5–15.5)
WBC Count: 3.3 10*3/uL — ABNORMAL LOW (ref 4.0–10.5)
nRBC: 0 % (ref 0.0–0.2)

## 2019-07-07 MED ORDER — SODIUM CHLORIDE 0.9 % IV SOLN
2.4000 mg/kg | Freq: Once | INTRAVENOUS | Status: AC
  Administered 2019-07-07: 10:00:00 160 mg via INTRAVENOUS
  Filled 2019-07-07: qty 5

## 2019-07-07 MED ORDER — SODIUM CHLORIDE 0.9% FLUSH
10.0000 mL | INTRAVENOUS | Status: DC | PRN
  Administered 2019-07-07: 10 mL
  Filled 2019-07-07: qty 10

## 2019-07-07 MED ORDER — DIPHENHYDRAMINE HCL 25 MG PO CAPS
50.0000 mg | ORAL_CAPSULE | Freq: Once | ORAL | Status: AC
  Administered 2019-07-07: 50 mg via ORAL

## 2019-07-07 MED ORDER — ACETAMINOPHEN 325 MG PO TABS
ORAL_TABLET | ORAL | Status: AC
  Filled 2019-07-07: qty 2

## 2019-07-07 MED ORDER — HEPARIN SOD (PORK) LOCK FLUSH 100 UNIT/ML IV SOLN
500.0000 [IU] | Freq: Once | INTRAVENOUS | Status: AC | PRN
  Administered 2019-07-07: 500 [IU]
  Filled 2019-07-07: qty 5

## 2019-07-07 MED ORDER — SODIUM CHLORIDE 0.9 % IV SOLN
Freq: Once | INTRAVENOUS | Status: AC
  Administered 2019-07-07: 09:00:00 via INTRAVENOUS
  Filled 2019-07-07: qty 250

## 2019-07-07 MED ORDER — ACETAMINOPHEN 325 MG PO TABS
650.0000 mg | ORAL_TABLET | Freq: Once | ORAL | Status: AC
  Administered 2019-07-07: 650 mg via ORAL

## 2019-07-07 MED ORDER — SODIUM CHLORIDE 0.9% FLUSH
10.0000 mL | Freq: Once | INTRAVENOUS | Status: AC
  Administered 2019-07-07: 08:00:00 10 mL
  Filled 2019-07-07: qty 10

## 2019-07-07 MED ORDER — DIPHENHYDRAMINE HCL 25 MG PO CAPS
ORAL_CAPSULE | ORAL | Status: AC
  Filled 2019-07-07: qty 2

## 2019-07-07 NOTE — Progress Notes (Signed)
Okay to treat today with platelet count of 86 per MD.  Dose reduction made by MD.

## 2019-07-07 NOTE — Patient Instructions (Signed)

## 2019-07-07 NOTE — Patient Instructions (Signed)
[place] Discharge Instructions for Patients Receiving Chemotherapy  Today you received the following chemotherapy agents:  Ado-trastuzumab emtanstine  To help prevent nausea and vomiting after your treatment, we encourage you to take your nausea medication as prescribed.   If you develop nausea and vomiting that is not controlled by your nausea medication, call the clinic.   BELOW ARE SYMPTOMS THAT SHOULD BE REPORTED IMMEDIATELY:  *FEVER GREATER THAN 100.5 F  *CHILLS WITH OR WITHOUT FEVER  NAUSEA AND VOMITING THAT IS NOT CONTROLLED WITH YOUR NAUSEA MEDICATION  *UNUSUAL SHORTNESS OF BREATH  *UNUSUAL BRUISING OR BLEEDING  TENDERNESS IN MOUTH AND THROAT WITH OR WITHOUT PRESENCE OF ULCERS  *URINARY PROBLEMS  *BOWEL PROBLEMS  UNUSUAL RASH Items with * indicate a potential emergency and should be followed up as soon as possible.  Feel free to call the clinic should you have any questions or concerns. The clinic phone number is [phone].  Please show the [place] at check-in to the Emergency Department and triage nurse.

## 2019-07-07 NOTE — Assessment & Plan Note (Signed)
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 Neoadjuvant chemotherapy with [place] Perjeta 6 cyclescompleted 5/6/2020followed by Herceptin/ Perjeta vs Kadcylamaintenance for 1 year 2. Followed byBilmastectomieswith sentinel lymph node study 03/08/2019 3. Followed by adjuvant radiation therapystarted 05/04/2019-06/14/2019 4.Followed by adjuvant antiestrogen therapy ------------------------------------------------------------------------------------------------------------------------------------ 03/08/2019: Bilateral mastectomies: Left mastectomy: Grade 2 IDC spanning 7.5 cm, margins negative, right mastectomy: Benign, ER 95%, PR 5%, HER-2 1+ negative pathology lost the lymph node excision.   Treatment plan: Kadcyla maintenance every 3 weeks Toxicities: 1.Nausea: Improved after medications prescribed by gastroenterology. 2.Mild abdominal discomfort: on Nexium 3.Mild thrombocytopenia: Monitoring closely. 4.Leukopenia and anemia: Dose reduction, stable counts now. 5.Neuropathy sensation of the dorsum of the feet intermittently: Monitoring closely  Denies any diarrhea. Markedimprovement in the hemoglobin levels today's hemoglobin is10.3  Radiationstarted 05/05/2019-05/15/2019 Antiestrogen therapy with anastrozole 1 mg p.o. daily x5 years  Anastrozole toxicities:  Return to clinic every 3 weeks with labs and MD visit and follow-up for Kadcyla

## 2019-07-08 ENCOUNTER — Telehealth: Payer: Self-pay | Admitting: Hematology and Oncology

## 2019-07-08 NOTE — Telephone Encounter (Signed)
Katie [name] called me to inform me that she is getting very anxious about the brain aneurysm and she would like to proceed with vascular surgery to fix the aneurysm because every headache that she gets she thinks something bad might be happening. I think it is reasonable to hold further Kadcyla treatment until she gets this is a procedure done. I spoke with Dr. Patsey Berthold about her wishes and their office will reach out to her and arrange for this to be done. We will simply cancel her next treatment and then await her recovery to resume her treatment postoperatively.

## 2019-07-08 NOTE — Progress Notes (Signed)
I called the patient today about her upcoming follow-up appointment in radiation oncology.   Given concerns about the COVID-19 pandemic, I offered a phone assessment with the patient to determine if coming to the clinic was necessary. She accepted.  I let the patient know that I had spoken with Dr. Isidore Moos, and she wanted them to know the importance of washing their hands for at least 20 seconds at a time, especially after going out in public, and before they eat.  Limit going out in public whenever possible. Do not touch your face, unless your hands are clean, such as when bathing. Get plenty of rest, eat well, and stay hydrated.   The patient denies any symptomatic concerns.  Specifically, they report good healing of their skin in the radiation fields.  Skin is intact.    I recommended that she continue skin care by applying oil or lotion with vitamin E to the skin in the radiation fields, BID, for 2 more months.  Continue follow-up with medical oncology - follow-up is scheduled on 08/18/19 for an appointment with Dr. Lindi Adie and infusion.  I explained that yearly mammograms are important for patients with intact breast tissue, and physical exams are important after mastectomy for patients that cannot undergo mammography.  I encouraged her to call if she had further questions or concerns about her healing. Otherwise, she will follow-up PRN in radiation oncology. Patient is pleased with this plan, and we will cancel her upcoming follow-up to reduce the risk of COVID-19 transmission.

## 2019-07-09 ENCOUNTER — Ambulatory Visit
Admission: RE | Admit: 2019-07-09 | Discharge: 2019-07-09 | Disposition: A | Discharge status: Home / Self Care | Payer: Medicare Other | Source: Ambulatory Visit | Attending: Radiation Oncology | Admitting: Radiation Oncology

## 2019-07-12 ENCOUNTER — Encounter: Payer: Self-pay | Admitting: Hematology and Oncology

## 2019-07-13 ENCOUNTER — Telehealth: Payer: Self-pay | Admitting: *Deleted

## 2019-07-13 NOTE — Telephone Encounter (Signed)
Received [facility] Bone Density report from pt.  T-score showing -2.5 with Osteoporosis showing only in left arm.  Scan ordered by pt PCP Amy Diona Browner who is managing pt osteoporosis and osteopenia.  Pt is currently taking 1200 mg of calcium daily and 1,000 iu of Vitamin D daily as well.

## 2019-07-16 ENCOUNTER — Encounter: Payer: Self-pay | Admitting: Hematology and Oncology

## 2019-07-16 ENCOUNTER — Ambulatory Visit: Payer: Self-pay | Admitting: Radiation Oncology

## 2019-07-16 ENCOUNTER — Other Ambulatory Visit: Payer: Self-pay | Admitting: *Deleted

## 2019-07-16 DIAGNOSIS — C50412 Malignant neoplasm of upper-outer quadrant of left female breast: Secondary | ICD-10-CM

## 2019-07-16 DIAGNOSIS — I671 Cerebral aneurysm, nonruptured: Secondary | ICD-10-CM | POA: Diagnosis not present

## 2019-07-16 DIAGNOSIS — R768 Other specified abnormal immunological findings in serum: Secondary | ICD-10-CM | POA: Diagnosis not present

## 2019-07-16 DIAGNOSIS — Z5181 Encounter for therapeutic drug level monitoring: Secondary | ICD-10-CM | POA: Diagnosis not present

## 2019-07-16 DIAGNOSIS — Z20828 Contact with and (suspected) exposure to other viral communicable diseases: Secondary | ICD-10-CM | POA: Diagnosis not present

## 2019-07-16 DIAGNOSIS — Z79899 Other long term (current) drug therapy: Secondary | ICD-10-CM | POA: Diagnosis not present

## 2019-07-16 DIAGNOSIS — Z01818 Encounter for other preprocedural examination: Secondary | ICD-10-CM | POA: Diagnosis not present

## 2019-07-16 DIAGNOSIS — I517 Cardiomegaly: Secondary | ICD-10-CM | POA: Diagnosis not present

## 2019-07-23 ENCOUNTER — Other Ambulatory Visit: Payer: Self-pay

## 2019-07-23 ENCOUNTER — Inpatient Hospital Stay: Payer: Medicare Other

## 2019-07-23 ENCOUNTER — Other Ambulatory Visit: Payer: Self-pay | Admitting: *Deleted

## 2019-07-23 DIAGNOSIS — C50412 Malignant neoplasm of upper-outer quadrant of left female breast: Secondary | ICD-10-CM

## 2019-07-23 DIAGNOSIS — I10 Essential (primary) hypertension: Secondary | ICD-10-CM | POA: Diagnosis not present

## 2019-07-23 DIAGNOSIS — E119 Type 2 diabetes mellitus without complications: Secondary | ICD-10-CM | POA: Diagnosis not present

## 2019-07-23 DIAGNOSIS — G620 Drug-induced polyneuropathy: Secondary | ICD-10-CM | POA: Diagnosis not present

## 2019-07-23 DIAGNOSIS — Z170 Estrogen receptor positive status [ER+]: Secondary | ICD-10-CM | POA: Diagnosis not present

## 2019-07-23 DIAGNOSIS — E785 Hyperlipidemia, unspecified: Secondary | ICD-10-CM | POA: Diagnosis not present

## 2019-07-23 LAB — CBC WITH DIFFERENTIAL (CANCER CENTER ONLY)
Abs Immature Granulocytes: 0.01 10*3/uL (ref 0.00–0.07)
Basophils Absolute: 0 10*3/uL (ref 0.0–0.1)
Basophils Relative: 0 %
Eosinophils Absolute: 0.1 10*3/uL (ref 0.0–0.5)
Eosinophils Relative: 3 %
HCT: 30.6 % — ABNORMAL LOW (ref 36.0–46.0)
Hemoglobin: 10.5 g/dL — ABNORMAL LOW (ref 12.0–15.0)
Immature Granulocytes: 0 %
Lymphocytes Relative: 18 %
Lymphs Abs: 0.6 10*3/uL — ABNORMAL LOW (ref 0.7–4.0)
MCH: 34.8 pg — ABNORMAL HIGH (ref 26.0–34.0)
MCHC: 34.3 g/dL (ref 30.0–36.0)
MCV: 101.3 fL — ABNORMAL HIGH (ref 80.0–100.0)
Monocytes Absolute: 0.3 10*3/uL (ref 0.1–1.0)
Monocytes Relative: 10 %
Neutro Abs: 2.1 10*3/uL (ref 1.7–7.7)
Neutrophils Relative %: 69 %
Platelet Count: 83 10*3/uL — ABNORMAL LOW (ref 150–400)
RBC: 3.02 MIL/uL — ABNORMAL LOW (ref 3.87–5.11)
RDW: 16.1 % — ABNORMAL HIGH (ref 11.5–15.5)
WBC Count: 3.1 10*3/uL — ABNORMAL LOW (ref 4.0–10.5)
nRBC: 0 % (ref 0.0–0.2)

## 2019-07-23 LAB — CMP (CANCER CENTER ONLY)
ALT: 32 U/L (ref 0–44)
AST: 34 U/L (ref 15–41)
Albumin: 3.9 g/dL (ref 3.5–5.0)
Alkaline Phosphatase: 109 U/L (ref 38–126)
Anion gap: 13 (ref 5–15)
BUN: 16 mg/dL (ref 8–23)
CO2: 24 mmol/L (ref 22–32)
Calcium: 9.5 mg/dL (ref 8.9–10.3)
Chloride: 105 mmol/L (ref 98–111)
Creatinine: 0.82 mg/dL (ref 0.44–1.00)
GFR, Est AFR Am: >60 mL/min (ref >60)
GFR, Estimated: >60 mL/min (ref >60)
Glucose, Bld: 122 mg/dL — ABNORMAL HIGH (ref 70–99)
Potassium: 3.6 mmol/L (ref 3.5–5.1)
Sodium: 142 mmol/L (ref 135–145)
Total Bilirubin: 0.6 mg/dL (ref 0.3–1.2)
Total Protein: 7.5 g/dL (ref 6.5–8.1)

## 2019-07-28 ENCOUNTER — Other Ambulatory Visit: Payer: Medicare Other

## 2019-07-28 ENCOUNTER — Ambulatory Visit: Payer: Medicare Other

## 2019-07-29 ENCOUNTER — Other Ambulatory Visit: Payer: Self-pay | Admitting: *Deleted

## 2019-07-29 ENCOUNTER — Inpatient Hospital Stay: Payer: Medicare Other | Attending: Hematology and Oncology

## 2019-07-29 ENCOUNTER — Encounter: Payer: Self-pay | Admitting: *Deleted

## 2019-07-29 ENCOUNTER — Other Ambulatory Visit: Payer: Self-pay

## 2019-07-29 DIAGNOSIS — Z170 Estrogen receptor positive status [ER+]: Secondary | ICD-10-CM | POA: Diagnosis not present

## 2019-07-29 DIAGNOSIS — C50412 Malignant neoplasm of upper-outer quadrant of left female breast: Secondary | ICD-10-CM

## 2019-07-29 LAB — CBC WITH DIFFERENTIAL (CANCER CENTER ONLY)
Abs Immature Granulocytes: 0.01 10*3/uL (ref 0.00–0.07)
Basophils Absolute: 0 10*3/uL (ref 0.0–0.1)
Basophils Relative: 0 %
Eosinophils Absolute: 0.1 10*3/uL (ref 0.0–0.5)
Eosinophils Relative: 3 %
HCT: 32.2 % — ABNORMAL LOW (ref 36.0–46.0)
Hemoglobin: 10.7 g/dL — ABNORMAL LOW (ref 12.0–15.0)
Immature Granulocytes: 0 %
Lymphocytes Relative: 18 %
Lymphs Abs: 0.6 10*3/uL — ABNORMAL LOW (ref 0.7–4.0)
MCH: 34.4 pg — ABNORMAL HIGH (ref 26.0–34.0)
MCHC: 33.2 g/dL (ref 30.0–36.0)
MCV: 103.5 fL — ABNORMAL HIGH (ref 80.0–100.0)
Monocytes Absolute: 0.4 10*3/uL (ref 0.1–1.0)
Monocytes Relative: 14 %
Neutro Abs: 2 10*3/uL (ref 1.7–7.7)
Neutrophils Relative %: 65 %
Platelet Count: 86 10*3/uL — ABNORMAL LOW (ref 150–400)
RBC: 3.11 MIL/uL — ABNORMAL LOW (ref 3.87–5.11)
RDW: 15.9 % — ABNORMAL HIGH (ref 11.5–15.5)
WBC Count: 3.1 10*3/uL — ABNORMAL LOW (ref 4.0–10.5)
nRBC: 0 % (ref 0.0–0.2)

## 2019-07-29 NOTE — Progress Notes (Signed)
Faxed pt CBC to Dr. Patsey Berthold with [facility] [phone]).  RN placed call to Dr. Patsey Berthold office and LVM regarding pt platelet results and to return call to our office to discuss further management for upcoming neurosurgery.

## 2019-08-05 ENCOUNTER — Inpatient Hospital Stay: Payer: Medicare Other

## 2019-08-05 ENCOUNTER — Other Ambulatory Visit: Payer: Self-pay

## 2019-08-05 DIAGNOSIS — C50412 Malignant neoplasm of upper-outer quadrant of left female breast: Secondary | ICD-10-CM | POA: Diagnosis not present

## 2019-08-05 LAB — CMP (CANCER CENTER ONLY)
ALT: 33 U/L (ref 0–44)
AST: 29 U/L (ref 15–41)
Albumin: 4 g/dL (ref 3.5–5.0)
Alkaline Phosphatase: 120 U/L (ref 38–126)
Anion gap: 10 (ref 5–15)
BUN: 20 mg/dL (ref 8–23)
CO2: 27 mmol/L (ref 22–32)
Calcium: 9.8 mg/dL (ref 8.9–10.3)
Chloride: 105 mmol/L (ref 98–111)
Creatinine: 0.84 mg/dL (ref 0.44–1.00)
GFR, Est AFR Am: >60 mL/min (ref >60)
GFR, Estimated: >60 mL/min (ref >60)
Glucose, Bld: 119 mg/dL — ABNORMAL HIGH (ref 70–99)
Potassium: 4 mmol/L (ref 3.5–5.1)
Sodium: 142 mmol/L (ref 135–145)
Total Bilirubin: 0.5 mg/dL (ref 0.3–1.2)
Total Protein: 7.9 g/dL (ref 6.5–8.1)

## 2019-08-05 LAB — CBC WITH DIFFERENTIAL (CANCER CENTER ONLY)
Abs Immature Granulocytes: 0.02 10*3/uL (ref 0.00–0.07)
Basophils Absolute: 0 10*3/uL (ref 0.0–0.1)
Basophils Relative: 1 %
Eosinophils Absolute: 0.1 10*3/uL (ref 0.0–0.5)
Eosinophils Relative: 3 %
HCT: 34 % — ABNORMAL LOW (ref 36.0–46.0)
Hemoglobin: 11.4 g/dL — ABNORMAL LOW (ref 12.0–15.0)
Immature Granulocytes: 1 %
Lymphocytes Relative: 17 %
Lymphs Abs: 0.7 10*3/uL (ref 0.7–4.0)
MCH: 34.7 pg — ABNORMAL HIGH (ref 26.0–34.0)
MCHC: 33.5 g/dL (ref 30.0–36.0)
MCV: 103.3 fL — ABNORMAL HIGH (ref 80.0–100.0)
Monocytes Absolute: 0.4 10*3/uL (ref 0.1–1.0)
Monocytes Relative: 10 %
Neutro Abs: 2.8 10*3/uL (ref 1.7–7.7)
Neutrophils Relative %: 68 %
Platelet Count: 103 10*3/uL — ABNORMAL LOW (ref 150–400)
RBC: 3.29 MIL/uL — ABNORMAL LOW (ref 3.87–5.11)
RDW: 15.3 % (ref 11.5–15.5)
WBC Count: 4.1 10*3/uL (ref 4.0–10.5)
nRBC: 0 % (ref 0.0–0.2)

## 2019-08-09 DIAGNOSIS — I671 Cerebral aneurysm, nonruptured: Secondary | ICD-10-CM | POA: Diagnosis not present

## 2019-08-10 DIAGNOSIS — Z79899 Other long term (current) drug therapy: Secondary | ICD-10-CM | POA: Diagnosis not present

## 2019-08-10 DIAGNOSIS — Z923 Personal history of irradiation: Secondary | ICD-10-CM | POA: Diagnosis not present

## 2019-08-10 DIAGNOSIS — I671 Cerebral aneurysm, nonruptured: Secondary | ICD-10-CM | POA: Diagnosis not present

## 2019-08-10 DIAGNOSIS — Z853 Personal history of malignant neoplasm of breast: Secondary | ICD-10-CM | POA: Diagnosis not present

## 2019-08-10 DIAGNOSIS — Z5181 Encounter for therapeutic drug level monitoring: Secondary | ICD-10-CM | POA: Diagnosis not present

## 2019-08-10 DIAGNOSIS — Z9221 Personal history of antineoplastic chemotherapy: Secondary | ICD-10-CM | POA: Diagnosis not present

## 2019-08-11 DIAGNOSIS — L668 Other cicatricial alopecia: Secondary | ICD-10-CM | POA: Diagnosis not present

## 2019-08-12 ENCOUNTER — Other Ambulatory Visit: Payer: Self-pay | Admitting: Family Medicine

## 2019-08-17 DIAGNOSIS — I671 Cerebral aneurysm, nonruptured: Secondary | ICD-10-CM | POA: Diagnosis not present

## 2019-08-18 ENCOUNTER — Ambulatory Visit: Payer: Medicare Other

## 2019-08-18 ENCOUNTER — Ambulatory Visit: Payer: Medicare Other | Admitting: Hematology and Oncology

## 2019-08-18 ENCOUNTER — Other Ambulatory Visit: Payer: Medicare Other

## 2019-08-18 DIAGNOSIS — I671 Cerebral aneurysm, nonruptured: Secondary | ICD-10-CM | POA: Diagnosis not present

## 2019-08-18 DIAGNOSIS — I959 Hypotension, unspecified: Secondary | ICD-10-CM | POA: Diagnosis not present

## 2019-08-18 DIAGNOSIS — D62 Acute posthemorrhagic anemia: Secondary | ICD-10-CM | POA: Diagnosis not present

## 2019-08-19 MED ORDER — DEXTROSE 50 % IV SOLN
12.50 | INTRAVENOUS | Status: DC
Start: ? — End: 2019-08-19

## 2019-08-19 MED ORDER — SENNOSIDES-DOCUSATE SODIUM 8.6-50 MG PO TABS
2.00 | ORAL_TABLET | ORAL | Status: DC
Start: 2019-08-20 — End: 2019-08-19

## 2019-08-19 MED ORDER — TICAGRELOR 90 MG PO TABS
90.00 | ORAL_TABLET | ORAL | Status: DC
Start: 2019-08-19 — End: 2019-08-19

## 2019-08-19 MED ORDER — INSULIN REGULAR HUMAN 100 UNIT/ML IJ SOLN
0.00 | INTRAMUSCULAR | Status: DC
Start: 2019-08-19 — End: 2019-08-19

## 2019-08-19 MED ORDER — ANASTROZOLE 1 MG PO TABS
1.00 | ORAL_TABLET | ORAL | Status: DC
Start: 2019-08-20 — End: 2019-08-19

## 2019-08-19 MED ORDER — ACETAMINOPHEN 325 MG PO TABS
975.00 | ORAL_TABLET | ORAL | Status: DC
Start: ? — End: 2019-08-19

## 2019-08-19 MED ORDER — HEPARIN SODIUM (PORCINE) 5000 UNIT/ML IJ SOLN
5000.00 | INTRAMUSCULAR | Status: DC
Start: 2019-08-19 — End: 2019-08-19

## 2019-08-19 MED ORDER — POLYETHYLENE GLYCOL 3350 17 GM/SCOOP PO POWD
17.00 | ORAL | Status: DC
Start: ? — End: 2019-08-19

## 2019-08-19 MED ORDER — GLUCAGON (RDNA) 1 MG IJ KIT
1.00 | PACK | INTRAMUSCULAR | Status: DC
Start: ? — End: 2019-08-19

## 2019-08-19 MED ORDER — ASPIRIN 81 MG PO TBEC
81.00 | DELAYED_RELEASE_TABLET | ORAL | Status: DC
Start: 2019-08-20 — End: 2019-08-19

## 2019-08-19 MED ORDER — ONDANSETRON HCL 4 MG/2ML IJ SOLN
4.00 | INTRAMUSCULAR | Status: DC
Start: ? — End: 2019-08-19

## 2019-08-19 MED ORDER — ATORVASTATIN CALCIUM 10 MG PO TABS
10.00 | ORAL_TABLET | ORAL | Status: DC
Start: 2019-08-19 — End: 2019-08-19

## 2019-08-19 MED ORDER — GENERIC EXTERNAL MEDICATION
Status: DC
Start: ? — End: 2019-08-19

## 2019-08-30 NOTE — Progress Notes (Signed)
  Patient Name: Katie [name] MRN: [id number] DOB: 08-13-1946 Referring Physician: Eliezer Lofts (Profile Not Attached) Date of Service: 06/14/2019 [facility] [place]                                                        End Of Treatment Note  Diagnoses: C50.412-Malignant neoplasm of upper-outer quadrant of left female breast  Cancer Staging: Cancer Staging Malignant neoplasm of upper-outer quadrant of left breast in female, estrogen receptor positive ([place]) Staging form: Breast, AJCC 8th Edition - Clinical stage from 09/16/2018: Stage IIIB (cT4, cN0, cM0, G3, ER+, PR+, HER2+) - Signed by [name], MD on 09/16/2018 - Pathologic stage from 03/08/2019: No Stage Recommended (ypT3, pNX, cM0, G2, ER+, PR+, HER2-) - Signed by [name], MD on 04/21/2019   Intent: Curative  Radiation Treatment Dates: 05/04/2019 through 06/14/2019 Site Technique Total Dose (Gy) Dose per Fx (Gy) Completed Fx Beam Energies  Breast: CW_Lt 3D 50/50 2 25/25 6X, 10X  Breast: CW_Lt_PAB_SCV 3D 50/50 2 25/25 10X  Breast: CW_Lt_Bst Electron 10/10 2 5/5 6E   Narrative: The patient tolerated radiation therapy relatively well.   Plan: The patient will follow-up with radiation oncology in [place] as needed . -----------------------------------  SEppie Gibson MD

## 2019-09-03 ENCOUNTER — Telehealth: Payer: Self-pay

## 2019-09-03 DIAGNOSIS — R202 Paresthesia of skin: Secondary | ICD-10-CM | POA: Diagnosis not present

## 2019-09-03 NOTE — Telephone Encounter (Signed)
Late entry:  Patient called 09/02/2019 to update that she has been experiencing significant increase in tingling/numbness in bilateral feet.  Pt contacted her surgeon, Dr. Patsey Berthold @ [facility], and they will be setting her up with a test to review circulation.  Per pt Dr. Patsey Berthold was wanting to inquire with oncologist if he could provided feedback on thoughts, due to patient being under active treatment.

## 2019-09-06 ENCOUNTER — Other Ambulatory Visit: Payer: Self-pay | Admitting: *Deleted

## 2019-09-06 DIAGNOSIS — D6481 Anemia due to antineoplastic chemotherapy: Secondary | ICD-10-CM

## 2019-09-07 ENCOUNTER — Other Ambulatory Visit: Payer: Self-pay

## 2019-09-07 ENCOUNTER — Inpatient Hospital Stay: Payer: Medicare Other | Attending: Hematology and Oncology

## 2019-09-07 DIAGNOSIS — D72819 Decreased white blood cell count, unspecified: Secondary | ICD-10-CM | POA: Insufficient documentation

## 2019-09-07 DIAGNOSIS — Z9013 Acquired absence of bilateral breasts and nipples: Secondary | ICD-10-CM | POA: Diagnosis not present

## 2019-09-07 DIAGNOSIS — Z7952 Long term (current) use of systemic steroids: Secondary | ICD-10-CM | POA: Diagnosis not present

## 2019-09-07 DIAGNOSIS — Z79811 Long term (current) use of aromatase inhibitors: Secondary | ICD-10-CM | POA: Diagnosis not present

## 2019-09-07 DIAGNOSIS — C50412 Malignant neoplasm of upper-outer quadrant of left female breast: Secondary | ICD-10-CM | POA: Insufficient documentation

## 2019-09-07 DIAGNOSIS — Z9882 Breast implant status: Secondary | ICD-10-CM | POA: Diagnosis not present

## 2019-09-07 DIAGNOSIS — Z7984 Long term (current) use of oral hypoglycemic drugs: Secondary | ICD-10-CM | POA: Insufficient documentation

## 2019-09-07 DIAGNOSIS — G629 Polyneuropathy, unspecified: Secondary | ICD-10-CM | POA: Insufficient documentation

## 2019-09-07 DIAGNOSIS — D696 Thrombocytopenia, unspecified: Secondary | ICD-10-CM | POA: Diagnosis not present

## 2019-09-07 DIAGNOSIS — Z170 Estrogen receptor positive status [ER+]: Secondary | ICD-10-CM | POA: Insufficient documentation

## 2019-09-07 DIAGNOSIS — Z79899 Other long term (current) drug therapy: Secondary | ICD-10-CM | POA: Diagnosis not present

## 2019-09-07 DIAGNOSIS — Z923 Personal history of irradiation: Secondary | ICD-10-CM | POA: Diagnosis not present

## 2019-09-07 LAB — CBC WITH DIFFERENTIAL (CANCER CENTER ONLY)
Abs Immature Granulocytes: 0.01 K/uL (ref 0.00–0.07)
Basophils Absolute: 0 K/uL (ref 0.0–0.1)
Basophils Relative: 0 %
Eosinophils Absolute: 0.2 K/uL (ref 0.0–0.5)
Eosinophils Relative: 7 %
HCT: 31.8 % — ABNORMAL LOW (ref 36.0–46.0)
Hemoglobin: 10.7 g/dL — ABNORMAL LOW (ref 12.0–15.0)
Immature Granulocytes: 0 %
Lymphocytes Relative: 18 %
Lymphs Abs: 0.4 K/uL — ABNORMAL LOW (ref 0.7–4.0)
MCH: 34.5 pg — ABNORMAL HIGH (ref 26.0–34.0)
MCHC: 33.6 g/dL (ref 30.0–36.0)
MCV: 102.6 fL — ABNORMAL HIGH (ref 80.0–100.0)
Monocytes Absolute: 0.4 K/uL (ref 0.1–1.0)
Monocytes Relative: 18 %
Neutro Abs: 1.3 K/uL — ABNORMAL LOW (ref 1.7–7.7)
Neutrophils Relative %: 57 %
Platelet Count: 104 K/uL — ABNORMAL LOW (ref 150–400)
RBC: 3.1 MIL/uL — ABNORMAL LOW (ref 3.87–5.11)
RDW: 16.9 % — ABNORMAL HIGH (ref 11.5–15.5)
WBC Count: 2.4 K/uL — ABNORMAL LOW (ref 4.0–10.5)
nRBC: 0 % (ref 0.0–0.2)

## 2019-09-07 LAB — CMP (CANCER CENTER ONLY)
ALT: 32 U/L (ref 0–44)
AST: 28 U/L (ref 15–41)
Albumin: 3.8 g/dL (ref 3.5–5.0)
Alkaline Phosphatase: 123 U/L (ref 38–126)
Anion gap: 12 (ref 5–15)
BUN: 20 mg/dL (ref 8–23)
CO2: 23 mmol/L (ref 22–32)
Calcium: 9.4 mg/dL (ref 8.9–10.3)
Chloride: 105 mmol/L (ref 98–111)
Creatinine: 0.81 mg/dL (ref 0.44–1.00)
GFR, Est AFR Am: >60 mL/min
GFR, Estimated: >60 mL/min
Glucose, Bld: 123 mg/dL — ABNORMAL HIGH (ref 70–99)
Potassium: 4.1 mmol/L (ref 3.5–5.1)
Sodium: 140 mmol/L (ref 135–145)
Total Bilirubin: 0.6 mg/dL (ref 0.3–1.2)
Total Protein: 7.5 g/dL (ref 6.5–8.1)

## 2019-09-07 NOTE — Progress Notes (Signed)
Patient Care Team: [name], MD as PCP - General (Family Medicine) Birder [name], MD as Referring Physician (Ophthalmology) [name], MD as Consulting Physician (Dermatology) [name], MD (Inactive) as Consulting Physician (Orthopedic Surgery) [name], MD as Consulting Physician (General Surgery) [name], MD as Consulting Physician (Dentistry) [name], MD (Inactive) as Consulting Physician (General Surgery) [name], MD as Consulting Physician (Hematology and Oncology) [name], MD as Attending Physician (Radiation Oncology)  DIAGNOSIS:    ICD-10-CM   1. Malignant neoplasm of upper-outer quadrant of left breast in female, estrogen receptor positive ([place])  C50.412    Z17.0     SUMMARY OF ONCOLOGIC HISTORY: Oncology History  Malignant neoplasm of upper-outer quadrant of left breast in female, estrogen receptor positive ([place])  09/14/2018 Initial Diagnosis   With prior history of breast implants that were ruptured/replaced, patient had cellulitis LIQ left breast for 1 week, and UOQ asymmetry 1.5 cm, MRI right breast biopsy benign, left breast numerous masses 7 cm skin thickening: 2 biopsies from left breast: Grade 2-3 IDC ER PR positive, HER-2 positive, Ki-67 50%   09/16/2018 Cancer Staging   Staging form: Breast, AJCC 8th Edition - Clinical stage from 09/16/2018: Stage IIIB (cT4, cN0, cM0, G3, ER+, PR+, HER2+) - Signed by [name], MD on 09/16/2018   09/29/2018 -  Neo-Adjuvant Chemotherapy   TCHP q 3 weeks   01/19/2019 Breast MRI   Significant interval improvement, the multiple left breast masses and areas of non mass enhancement in all 4 quadrants have decreased in size and extent. The mass previously measuring 1.4 cm with diffuse enhancement now measures 9 mm with heterogeneous areas of enhancement. Current greatest transverse dimension is 5.4 x 3.9 cm, previously 7 cm x 5cm. 2. 4 mm masslike focus of enhancement at the  base of the right nipple is unchanged.   02/10/2019 - 03/23/2019 Chemotherapy   The patient had trastuzumab (HERCEPTIN) 450 mg in sodium chloride 0.9 % 250 mL chemo infusion, 420 mg (100 % of original dose 6 mg/kg), Intravenous,  Once, 2 of 5 cycles Dose modification: 6 mg/kg (original dose 6 mg/kg, Cycle 1, Reason: Provider Judgment) Administration: 450 mg (02/10/2019), 450 mg (03/03/2019)  for chemotherapy treatment.    03/08/2019 Surgery   Bilateral mastectomies [name]: right breast: no malignancy; left breast: IDC, grade 2, scattered over 7.5cm, clear margins.    03/08/2019 Cancer Staging   Staging form: Breast, AJCC 8th Edition - Pathologic stage from 03/08/2019: No Stage Recommended (ypT3, pNX, cM0, G2, ER+, PR+, HER2-) - Signed by [name], MD on 04/21/2019   03/24/2019 -  Chemotherapy   The patient had ado-trastuzumab emtansine (KADCYLA) 260 mg in sodium chloride 0.9 % 250 mL chemo infusion, 3.6 mg/kg = 260 mg, Intravenous, Once, 6 of 10 cycles Dose modification: 3 mg/kg (original dose 3.6 mg/kg, Cycle 5, Reason: Dose not tolerated), 2.4 mg/kg (original dose 3.6 mg/kg, Cycle 6, Reason: Provider Judgment) Administration: 260 mg (03/24/2019), 260 mg (04/14/2019), 200 mg (06/16/2019), 260 mg (05/05/2019), 200 mg (05/26/2019), 160 mg (07/07/2019)  for chemotherapy treatment.    05/04/2019 - 06/14/2019 Radiation Therapy   Adjuvant radiation therapy     CHIEF COMPLIANT: Kadcyla maintenance, neuropathy in bilateral feet   INTERVAL HISTORY: Katie [name] is a 74 y.o. with above-mentioned history of HER-2 positiveleft breast cancertreated withneoadjuvant chemotherapy,bilateral mastectomies, radiation, and who is currently on adjuvant treatment with Kadcyla maintenance. Katie [name] has been on hold, as the patient underwent vascular surgery for a brain aneurysm  at [facility] on 08/18/[facility]. She presents to the clinic todayfor follow-up and to discuss increasing neuropathy in her bilateral feet.    ALLERGIES:  is allergic to erythromycin; ciprofloxacin; daypro [oxaprozin]; enalapril maleate; nsaids; and vioxx [rofecoxib].  MEDICATIONS:  Current Outpatient Medications  Medication Sig Dispense Refill   amLODipine (NORVASC) 10 MG tablet Take 1 tablet (10 mg total) by mouth daily. 90 tablet 3   anastrozole (ARIMIDEX) 1 MG tablet Take 1 tablet (1 mg total) by mouth daily. 90 tablet 3   atorvastatin (LIPITOR) 10 MG tablet TAKE 1 TABLET(10 MG) BY MOUTH DAILY 90 tablet 3   Azelastine HCl 137 MCG/SPRAY SOLN Place 1 spray into the nose daily as needed.     cholecalciferol (VITAMIN D) 400 units TABS tablet Take 400 Units by mouth 2 (two) times daily.     clobetasol (TEMOVATE) 0.05 % external solution Apply 1 application topically 2 (two) times daily as needed (scalp). Reported on 08/16/2015     dexamethasone (DECADRON) 0.1 % ophthalmic suspension Place 1 drop into both eyes 2 (two) times daily. 15 mL 1   dicyclomine (BENTYL) 10 MG capsule Take 1 capsule (10 mg total) by mouth 4 (four) times daily -  before meals and at bedtime. 120 capsule 2   fluticasone (FLONASE) 50 MCG/ACT nasal spray SHAKE LIQUID AND USE 2 SPRAYS IN EACH NOSTRIL EVERY DAY 16 g 5   glucose blood test strip USE TO CHECK BLOOD SUGAR DAILY 50 each 12   lidocaine-prilocaine (EMLA) cream Apply to affected area once 30 g 3   loratadine (CLARITIN) 10 MG tablet Take 10 mg by mouth daily.     metFORMIN (GLUCOPHAGE) 500 MG tablet Take 500 mg by mouth at bedtime.     metroNIDAZOLE (METROCREAM) 0.75 % cream Apply 1 application topically 2 (two) times daily as needed (rosacea).      Multiple Vitamin (MULTIVITAMIN WITH MINERALS) TABS tablet Take 1 tablet by mouth daily.     triamcinolone cream (KENALOG) 0.1 % Apply 1 application topically 2 (two) times daily as needed.     valACYclovir (VALTREX) 500 MG tablet Take 500 mg by mouth 2 (two) times daily as needed.     No current facility-administered medications for this  visit.    PHYSICAL EXAMINATION: ECOG PERFORMANCE STATUS: 1 - Symptomatic but completely ambulatory  Vitals:   09/08/19 0818  BP: (!) 164/68  Pulse: 93  Resp: 17  Temp: 97.8 F (36.6 C)  SpO2: 98%   Filed Weights   09/08/19 0818  Weight: 148 lb 3.2 oz (67.2 kg)    LABORATORY DATA:  I have reviewed the data as listed CMP Latest Ref Rng & Units 09/07/2019 08/05/2019 07/23/2019  Glucose 70 - 99 mg/dL 123(H) 119(H) 122(H)  BUN 8 - 23 mg/dL 20 20 16   Creatinine 0.44 - 1.00 mg/dL 0.81 0.84 0.82  Sodium 135 - 145 mmol/L 140 142 142  Potassium 3.5 - 5.1 mmol/L 4.1 4.0 3.6  Chloride 98 - 111 mmol/L 105 105 105  CO2 22 - 32 mmol/L 23 27 24   Calcium 8.9 - 10.3 mg/dL 9.4 9.8 9.5  Total Protein 6.5 - 8.1 g/dL 7.5 7.9 7.5  Total Bilirubin 0.3 - 1.2 mg/dL 0.6 0.5 0.6  Alkaline Phos 38 - 126 U/L 123 120 109  AST 15 - 41 U/L 28 29 34  ALT 0 - 44 U/L 32 33 32    Lab Results  Component Value Date   WBC 2.4 (L) 09/07/2019   HGB 10.7 (  L) 09/07/2019   HCT 31.8 (L) 09/07/2019   MCV 102.6 (H) 09/07/2019   PLT 104 (L) 09/07/2019   NEUTROABS 1.3 (L) 09/07/2019    ASSESSMENT & PLAN:  Malignant neoplasm of upper-outer quadrant of left breast in female, estrogen receptor positive ([place]) positive ([place]) With prior history of breast implants that were ruptured/replaced, patient had cellulitis LIQ left breast for 1 week, and UOQ asymmetry 1.5 cm, MRI right breast biopsy benign, left breast numerous masses 7 cm skin thickening: 2 biopsies from left breast: Grade 2-3 IDC ER PR positive, HER-2 positive, Ki-67 50% Clinical stage: T4 N0 M0 stage IIIb  Treatment plan 1. Neoadjuvant chemotherapy with [place] Perjeta 6 cyclescompleted 5/6/2020followed by Herceptin/ Perjeta vs Kadcylamaintenance for 1 year 2. Followed byBilmastectomieswith sentinel lymph node study 03/08/2019 3. Followed by adjuvant radiation therapystarted 05/04/2019-06/14/2019 4.Followed by adjuvant antiestrogen therapy started  06/16/2019 ------------------------------------------------------------------------------------------------------------------------------------ 03/08/2019: Bilateral mastectomies: Left mastectomy: Grade 2 IDC spanning 7.5 cm, margins negative, right mastectomy: Benign, ER 95%, PR 5%, HER-2 1+ negative pathology lost the lymph node excision.   Treatment plan: Kadcyla maintenance every 3 weeks with anastrozole 1 mg daily (treatment break given because of brain aneurysm surgery at [facility]), we will resume treatment with Herceptin alone starting 09/29/2019 Toxicities: 1.  Nausea: Monitoring 2.  Thrombocytopenia: We will further reduce the dosage of her treatment 3.  Leukopenia: In spite of not giving Kadcyla for the past 2 months her white count is low today which is surprising. 4.  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  Because of this we discussed different options and decided to stop Kadcyla and switch her to Herceptin.  I also sent a prescription for gabapentin at bedtime.  Anastrozole toxicities: None We will get an echocardiogram.  Return to clinic every 3 weeks with labs and follow-up on Herceptin.  She needs 4 more treatments.    No orders of the defined types were placed in this encounter.  The patient has a good understanding of the overall plan. she agrees with it. she will call with any problems that may develop before the next visit here.  Total time spent: 30 mins including face to face time and time spent for planning, charting and coordination of care  [name], MD 09/08/2019  I, Cloyde Reams Dorshimer, am acting as scribe for Dr. [name].  I have reviewed the above documentation for accuracy and completeness, and I agree with the above.

## 2019-09-08 ENCOUNTER — Other Ambulatory Visit: Payer: Medicare Other

## 2019-09-08 ENCOUNTER — Other Ambulatory Visit: Payer: Self-pay

## 2019-09-08 ENCOUNTER — Ambulatory Visit: Payer: Medicare Other

## 2019-09-08 ENCOUNTER — Inpatient Hospital Stay (HOSPITAL_BASED_OUTPATIENT_CLINIC_OR_DEPARTMENT_OTHER): Payer: Medicare Other | Admitting: Hematology and Oncology

## 2019-09-08 VITALS — BP 164/68 | HR 93 | Temp 97.8°F | Resp 17 | Ht 62.0 in | Wt 148.2 lb

## 2019-09-08 DIAGNOSIS — Z9013 Acquired absence of bilateral breasts and nipples: Secondary | ICD-10-CM | POA: Diagnosis not present

## 2019-09-08 DIAGNOSIS — D696 Thrombocytopenia, unspecified: Secondary | ICD-10-CM | POA: Diagnosis not present

## 2019-09-08 DIAGNOSIS — C50412 Malignant neoplasm of upper-outer quadrant of left female breast: Secondary | ICD-10-CM

## 2019-09-08 DIAGNOSIS — Z170 Estrogen receptor positive status [ER+]: Secondary | ICD-10-CM | POA: Diagnosis not present

## 2019-09-08 DIAGNOSIS — Z923 Personal history of irradiation: Secondary | ICD-10-CM | POA: Diagnosis not present

## 2019-09-08 DIAGNOSIS — Z79811 Long term (current) use of aromatase inhibitors: Secondary | ICD-10-CM | POA: Diagnosis not present

## 2019-09-08 MED ORDER — GABAPENTIN 300 MG PO CAPS: 300.0000 mg | ORAL_CAPSULE | Freq: Every day | ORAL | 3 refills | Status: DC

## 2019-09-08 NOTE — Assessment & Plan Note (Signed)
positive ([place]) With prior history of breast implants that were ruptured/replaced, patient had cellulitis LIQ left breast for 1 week, and UOQ asymmetry 1.5 cm, MRI right breast biopsy benign, left breast numerous masses 7 cm skin thickening: 2 biopsies from left breast: Grade 2-3 IDC ER PR positive, HER-2 positive, Ki-67 50% Clinical stage: T4 N0 M0 stage IIIb  Treatment plan 1. Neoadjuvant chemotherapy with [place] Perjeta 6 cyclescompleted 5/6/2020followed by Herceptin/ Perjeta vs Kadcylamaintenance for 1 year 2. Followed byBilmastectomieswith sentinel lymph node study 03/08/2019 3. Followed by adjuvant radiation therapystarted 05/04/2019-06/14/2019 4.Followed by adjuvant antiestrogen therapy started 06/16/2019 ------------------------------------------------------------------------------------------------------------------------------------ 03/08/2019: Bilateral mastectomies: Left mastectomy: Grade 2 IDC spanning 7.5 cm, margins negative, right mastectomy: Benign, ER 95%, PR 5%, HER-2 1+ negative pathology lost the lymph node excision.   Treatment plan: Kadcyla maintenance every 3 weeks with anastrozole 1 mg daily (treatment break given because of brain aneurysm surgery at [facility]) Toxicities: 1.  Nausea: Monitoring 2.  Thrombocytopenia: We will further reduce the dosage of her treatment 3.  Leukopenia and anemia monitoring 4.  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  Return to clinic every 3 weeks with labs and follow-up on Kadcyla

## 2019-09-10 ENCOUNTER — Encounter: Payer: Self-pay | Admitting: Hematology and Oncology

## 2019-09-10 ENCOUNTER — Other Ambulatory Visit: Payer: Medicare Other

## 2019-09-14 DIAGNOSIS — Z8679 Personal history of other diseases of the circulatory system: Secondary | ICD-10-CM | POA: Diagnosis not present

## 2019-09-14 DIAGNOSIS — I671 Cerebral aneurysm, nonruptured: Secondary | ICD-10-CM | POA: Diagnosis not present

## 2019-09-14 DIAGNOSIS — Z9889 Other specified postprocedural states: Secondary | ICD-10-CM | POA: Diagnosis not present

## 2019-09-20 ENCOUNTER — Other Ambulatory Visit: Payer: Self-pay | Admitting: *Deleted

## 2019-09-20 DIAGNOSIS — Z5181 Encounter for therapeutic drug level monitoring: Secondary | ICD-10-CM

## 2019-09-24 ENCOUNTER — Other Ambulatory Visit: Payer: Self-pay

## 2019-09-24 ENCOUNTER — Ambulatory Visit (HOSPITAL_COMMUNITY)
Admission: RE | Admit: 2019-09-24 | Discharge: 2019-09-24 | Disposition: A | Discharge status: Home / Self Care | Payer: Medicare Other | Source: Ambulatory Visit | Attending: Hematology and Oncology | Admitting: Hematology and Oncology

## 2019-09-24 DIAGNOSIS — Z5181 Encounter for therapeutic drug level monitoring: Secondary | ICD-10-CM | POA: Diagnosis not present

## 2019-09-24 DIAGNOSIS — E119 Type 2 diabetes mellitus without complications: Secondary | ICD-10-CM | POA: Insufficient documentation

## 2019-09-24 DIAGNOSIS — Z01810 Encounter for preprocedural cardiovascular examination: Secondary | ICD-10-CM | POA: Insufficient documentation

## 2019-09-24 DIAGNOSIS — E785 Hyperlipidemia, unspecified: Secondary | ICD-10-CM | POA: Diagnosis not present

## 2019-09-24 DIAGNOSIS — C50911 Malignant neoplasm of unspecified site of right female breast: Secondary | ICD-10-CM | POA: Insufficient documentation

## 2019-09-24 DIAGNOSIS — Z9013 Acquired absence of bilateral breasts and nipples: Secondary | ICD-10-CM | POA: Insufficient documentation

## 2019-09-24 DIAGNOSIS — I071 Rheumatic tricuspid insufficiency: Secondary | ICD-10-CM | POA: Diagnosis not present

## 2019-09-24 DIAGNOSIS — I10 Essential (primary) hypertension: Secondary | ICD-10-CM | POA: Insufficient documentation

## 2019-09-24 DIAGNOSIS — Z79899 Other long term (current) drug therapy: Secondary | ICD-10-CM | POA: Insufficient documentation

## 2019-09-24 DIAGNOSIS — C50912 Malignant neoplasm of unspecified site of left female breast: Secondary | ICD-10-CM | POA: Insufficient documentation

## 2019-09-24 NOTE — Progress Notes (Signed)
Echocardiogram 2D Echocardiogram has been performed.  [place] Deputy Tiny Chaudhary 09/24/2019, 9:26 AM

## 2019-09-27 ENCOUNTER — Other Ambulatory Visit: Payer: Self-pay | Admitting: *Deleted

## 2019-09-27 NOTE — Telephone Encounter (Signed)
Received fax from [facility] requesting refill on Metformin ER 500 mg.  We have regular Metformin 500 mg on medication list.  Please advise if patient is suppose to be on ER or regular.

## 2019-09-28 NOTE — Progress Notes (Signed)
Patient Care Team: [name], MD as PCP - General (Family Medicine) Birder [name], MD as Referring Physician (Ophthalmology) [name], MD as Consulting Physician (Dermatology) [name], MD (Inactive) as Consulting Physician (Orthopedic Surgery) [name], MD as Consulting Physician (General Surgery) [name], MD as Consulting Physician (Dentistry) [name], MD (Inactive) as Consulting Physician (General Surgery) [name], MD as Consulting Physician (Hematology and Oncology) [name], MD as Attending Physician (Radiation Oncology)  DIAGNOSIS:    ICD-10-CM   1. Malignant neoplasm of upper-outer quadrant of left breast in female, estrogen receptor positive ([place])  C50.412    Z17.0     SUMMARY OF ONCOLOGIC HISTORY: Oncology History  Malignant neoplasm of upper-outer quadrant of left breast in female, estrogen receptor positive ([place])  09/14/2018 Initial Diagnosis   With prior history of breast implants that were ruptured/replaced, patient had cellulitis LIQ left breast for 1 week, and UOQ asymmetry 1.5 cm, MRI right breast biopsy benign, left breast numerous masses 7 cm skin thickening: 2 biopsies from left breast: Grade 2-3 IDC ER PR positive, HER-2 positive, Ki-67 50%   09/16/2018 Cancer Staging   Staging form: Breast, AJCC 8th Edition - Clinical stage from 09/16/2018: Stage IIIB (cT4, cN0, cM0, G3, ER+, PR+, HER2+) - Signed by [name], MD on 09/16/2018   09/29/2018 -  Neo-Adjuvant Chemotherapy   TCHP q 3 weeks   01/19/2019 Breast MRI   Significant interval improvement, the multiple left breast masses and areas of non mass enhancement in all 4 quadrants have decreased in size and extent. The mass previously measuring 1.4 cm with diffuse enhancement now measures 9 mm with heterogeneous areas of enhancement. Current greatest transverse dimension is 5.4 x 3.9 cm, previously 7 cm x 5cm. 2. 4 mm masslike focus of enhancement at the  base of the right nipple is unchanged.   02/10/2019 - 03/23/2019 Chemotherapy   The patient had trastuzumab (HERCEPTIN) 450 mg in sodium chloride 0.9 % 250 mL chemo infusion, 420 mg (100 % of original dose 6 mg/kg), Intravenous,  Once, 2 of 5 cycles Dose modification: 6 mg/kg (original dose 6 mg/kg, Cycle 1, Reason: Provider Judgment) Administration: 450 mg (02/10/2019), 450 mg (03/03/2019)  for chemotherapy treatment.    03/08/2019 Surgery   Bilateral mastectomies [name]: right breast: no malignancy; left breast: IDC, grade 2, scattered over 7.5cm, clear margins.    03/08/2019 Cancer Staging   Staging form: Breast, AJCC 8th Edition - Pathologic stage from 03/08/2019: No Stage Recommended (ypT3, pNX, cM0, G2, ER+, PR+, HER2-) - Signed by [name], MD on 04/21/2019   03/24/2019 - 07/27/2019 Chemotherapy   The patient had ado-trastuzumab emtansine (KADCYLA) 260 mg in sodium chloride 0.9 % 250 mL chemo infusion, 3.6 mg/kg = 260 mg, Intravenous, Once, 6 of 10 cycles Dose modification: 3 mg/kg (original dose 3.6 mg/kg, Cycle 5, Reason: Dose not tolerated), 2.4 mg/kg (original dose 3.6 mg/kg, Cycle 6, Reason: Provider Judgment) Administration: 260 mg (03/24/2019), 260 mg (04/14/2019), 200 mg (06/16/2019), 260 mg (05/05/2019), 200 mg (05/26/2019), 160 mg (07/07/2019)  for chemotherapy treatment.    05/04/2019 - 06/14/2019 Radiation Therapy   Adjuvant radiation therapy   09/29/2019 -  Chemotherapy   The patient had trastuzumab-dkst (OGIVRI) 546 mg in sodium chloride 0.9 % 250 mL chemo infusion, 8 mg/kg = 546 mg, Intravenous,  Once, 0 of 4 cycles  for chemotherapy treatment.      CHIEF COMPLIANT: Herceptin maintenance  INTERVAL HISTORY: Katie [name] is a 74 y.o. with  above-mentioned history of HER-2 positiveleft breast cancertreated withneoadjuvant chemotherapy,bilateral mastectomies, radiation, and whois currently on adjuvant treatment with Herceptin maintenance. Echo on 09/24/19 showed an  ejection fraction of 60-65%. She presents to the clinic today for treatment.  She is going to start Herceptin maintenance today instead of Kadcyla because of her cytopenias as well as neuropathy.  ALLERGIES:  is allergic to erythromycin; ciprofloxacin; daypro [oxaprozin]; enalapril maleate; nsaids; and vioxx [rofecoxib].  MEDICATIONS:  Current Outpatient Medications  Medication Sig Dispense Refill  . amLODipine (NORVASC) 10 MG tablet Take 1 tablet (10 mg total) by mouth daily. 90 tablet 3  . anastrozole (ARIMIDEX) 1 MG tablet Take 1 tablet (1 mg total) by mouth daily. 90 tablet 3  . atorvastatin (LIPITOR) 10 MG tablet TAKE 1 TABLET(10 MG) BY MOUTH DAILY 90 tablet 3  . Azelastine HCl 137 MCG/SPRAY SOLN Place 1 spray into the nose daily as needed.    . cholecalciferol (VITAMIN D) 400 units TABS tablet Take 400 Units by mouth 2 (two) times daily.    . clobetasol (TEMOVATE) 0.05 % external solution Apply 1 application topically 2 (two) times daily as needed (scalp). Reported on 08/16/2015    . dexamethasone (DECADRON) 0.1 % ophthalmic suspension Place 1 drop into both eyes 2 (two) times daily. 15 mL 1  . dicyclomine (BENTYL) 10 MG capsule Take 1 capsule (10 mg total) by mouth 4 (four) times daily -  before meals and at bedtime. 120 capsule 2  . fluticasone (FLONASE) 50 MCG/ACT nasal spray SHAKE LIQUID AND USE 2 SPRAYS IN EACH NOSTRIL EVERY DAY 16 g 5  . gabapentin (NEURONTIN) 300 MG capsule Take 1 capsule (300 mg total) by mouth at bedtime. 90 capsule 3  . glucose blood test strip USE TO CHECK BLOOD SUGAR DAILY 50 each 12  . loratadine (CLARITIN) 10 MG tablet Take 10 mg by mouth daily.    . metFORMIN (GLUCOPHAGE-XR) 500 MG 24 hr tablet Take 1 tablet (500 mg total) by mouth at bedtime. 90 tablet 1  . metroNIDAZOLE (METROCREAM) 0.75 % cream Apply 1 application topically 2 (two) times daily as needed (rosacea).     . Multiple Vitamin (MULTIVITAMIN WITH MINERALS) TABS tablet Take 1 tablet by mouth  daily.    [name] triamcinolone cream (KENALOG) 0.1 % Apply 1 application topically 2 (two) times daily as needed.    . valACYclovir (VALTREX) 500 MG tablet Take 500 mg by mouth 2 (two) times daily as needed.     No current facility-administered medications for this visit.    PHYSICAL EXAMINATION: ECOG PERFORMANCE STATUS: 1 - Symptomatic but completely ambulatory  Vitals:   09/29/19 0834  BP: (!) 146/60  Pulse: 79  Resp: 17  Temp: 98.3 F (36.8 C)  SpO2: 98%   Filed Weights   09/29/19 0834  Weight: 148 lb 4.8 oz (67.3 kg)    LABORATORY DATA:  I have reviewed the data as listed CMP Latest Ref Rng & Units 09/07/2019 08/05/2019 07/23/2019  Glucose 70 - 99 mg/dL 123(H) 119(H) 122(H)  BUN 8 - 23 mg/dL 20 20 16   Creatinine 0.44 - 1.00 mg/dL 0.81 0.84 0.82  Sodium 135 - 145 mmol/L 140 142 142  Potassium 3.5 - 5.1 mmol/L 4.1 4.0 3.6  Chloride 98 - 111 mmol/L 105 105 105  CO2 22 - 32 mmol/L 23 27 24   Calcium 8.9 - 10.3 mg/dL 9.4 9.8 9.5  Total Protein 6.5 - 8.1 g/dL 7.5 7.9 7.5  Total Bilirubin 0.3 - 1.2 mg/dL 0.6  0.5 0.6  Alkaline Phos 38 - 126 U/L 123 120 109  AST 15 - 41 U/L 28 29 34  ALT 0 - 44 U/L 32 33 32    Lab Results  Component Value Date   WBC 3.4 (L) 09/29/2019   HGB 10.6 (L) 09/29/2019   HCT 31.3 (L) 09/29/2019   MCV 103.6 (H) 09/29/2019   PLT 98 (L) 09/29/2019   NEUTROABS 2.1 09/29/2019    ASSESSMENT & PLAN:  Malignant neoplasm of upper-outer quadrant of left breast in female, estrogen receptor positive ([place]) With prior history of breast implants that were ruptured/replaced, patient had cellulitis LIQ left breast for 1 week, and UOQ asymmetry 1.5 cm, MRI right breast biopsy benign, left breast numerous masses 7 cm skin thickening: 2 biopsies from left breast: Grade 2-3 IDC ER PR positive, HER-2 positive, Ki-67 50% Clinical stage: T4 N0 M0 stage IIIb  Treatment plan 1. Neoadjuvant chemotherapy with [place] Perjeta 6 cyclescompleted 5/6/2020followed by Herceptin/  Perjeta vs Kadcylamaintenance for 1 year 2. Followed byBilmastectomieswith sentinel lymph node study 03/08/2019 3. Followed by adjuvant radiation therapystarted 05/04/2019-06/14/2019 4.Followed by adjuvant antiestrogen therapy started 06/16/2019 ------------------------------------------------------------------------------------------------------------------------------------ 03/08/2019: Bilateral mastectomies: Left mastectomy: Grade 2 IDC spanning 7.5 cm, margins negative, right mastectomy: Benign, ER 95%, PR 5%, HER-2 1+ negative pathology lost the lymph node excision.   Treatment plan: Kadcyla maintenance every 3 weeks with anastrozole 1 mg daily (treatment break given because of brain aneurysm surgery at [facility]), we resumed treatment with Herceptin alone starting 09/29/2019  Toxicities: 1.  Nausea: No further issues 2.  Thrombocytopenia: We reduced the dosage of her treatment 3.  Leukopenia: Monitoring closely. 4.  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  If it gets worse then I may have to switch her back to Herceptin.  Anastrozole toxicities: None Echocardiogram 09/24/2019: EF 60 to 65%  Return to clinic every 3 weeks with labs and follow-up on Herceptin  She needs 3 more treatments.     No orders of the defined types were placed in this encounter.  The patient has a good understanding of the overall plan. she agrees with it. she will call with any problems that may develop before the next visit here.  Total time spent: 30 mins including face to face time and time spent for planning, charting and coordination of care  [name], MD 09/29/2019  I, Cloyde Reams Dorshimer, am acting as scribe for Dr. [name].  I have reviewed the above documentation for accuracy and completeness, and I agree with the above.

## 2019-09-28 NOTE — Telephone Encounter (Signed)
Should be 500 mg metformin ER... please send me pharmacy request.

## 2019-09-29 ENCOUNTER — Other Ambulatory Visit: Payer: Self-pay

## 2019-09-29 ENCOUNTER — Inpatient Hospital Stay: Payer: Medicare Other

## 2019-09-29 ENCOUNTER — Inpatient Hospital Stay (HOSPITAL_BASED_OUTPATIENT_CLINIC_OR_DEPARTMENT_OTHER): Payer: Medicare Other | Admitting: Hematology and Oncology

## 2019-09-29 ENCOUNTER — Encounter: Payer: Self-pay | Admitting: *Deleted

## 2019-09-29 ENCOUNTER — Inpatient Hospital Stay: Payer: Medicare Other | Attending: Hematology and Oncology

## 2019-09-29 DIAGNOSIS — G620 Drug-induced polyneuropathy: Secondary | ICD-10-CM | POA: Diagnosis not present

## 2019-09-29 DIAGNOSIS — C50412 Malignant neoplasm of upper-outer quadrant of left female breast: Secondary | ICD-10-CM | POA: Diagnosis not present

## 2019-09-29 DIAGNOSIS — Z79899 Other long term (current) drug therapy: Secondary | ICD-10-CM | POA: Diagnosis not present

## 2019-09-29 DIAGNOSIS — Z7984 Long term (current) use of oral hypoglycemic drugs: Secondary | ICD-10-CM | POA: Insufficient documentation

## 2019-09-29 DIAGNOSIS — Z79811 Long term (current) use of aromatase inhibitors: Secondary | ICD-10-CM | POA: Insufficient documentation

## 2019-09-29 DIAGNOSIS — Z5112 Encounter for antineoplastic immunotherapy: Secondary | ICD-10-CM | POA: Diagnosis not present

## 2019-09-29 DIAGNOSIS — Z9882 Breast implant status: Secondary | ICD-10-CM | POA: Insufficient documentation

## 2019-09-29 DIAGNOSIS — Z7952 Long term (current) use of systemic steroids: Secondary | ICD-10-CM | POA: Diagnosis not present

## 2019-09-29 DIAGNOSIS — I10 Essential (primary) hypertension: Secondary | ICD-10-CM | POA: Diagnosis not present

## 2019-09-29 DIAGNOSIS — Z170 Estrogen receptor positive status [ER+]: Secondary | ICD-10-CM

## 2019-09-29 DIAGNOSIS — E119 Type 2 diabetes mellitus without complications: Secondary | ICD-10-CM | POA: Insufficient documentation

## 2019-09-29 DIAGNOSIS — Z95828 Presence of other vascular implants and grafts: Secondary | ICD-10-CM

## 2019-09-29 DIAGNOSIS — T451X5A Adverse effect of antineoplastic and immunosuppressive drugs, initial encounter: Secondary | ICD-10-CM

## 2019-09-29 DIAGNOSIS — D6481 Anemia due to antineoplastic chemotherapy: Secondary | ICD-10-CM

## 2019-09-29 DIAGNOSIS — E785 Hyperlipidemia, unspecified: Secondary | ICD-10-CM | POA: Diagnosis not present

## 2019-09-29 DIAGNOSIS — Z923 Personal history of irradiation: Secondary | ICD-10-CM | POA: Diagnosis not present

## 2019-09-29 DIAGNOSIS — Z9221 Personal history of antineoplastic chemotherapy: Secondary | ICD-10-CM | POA: Diagnosis not present

## 2019-09-29 LAB — CBC WITH DIFFERENTIAL (CANCER CENTER ONLY)
Abs Immature Granulocytes: 0.01 10*3/uL (ref 0.00–0.07)
Basophils Absolute: 0 10*3/uL (ref 0.0–0.1)
Basophils Relative: 1 %
Eosinophils Absolute: 0.2 10*3/uL (ref 0.0–0.5)
Eosinophils Relative: 6 %
HCT: 31.3 % — ABNORMAL LOW (ref 36.0–46.0)
Hemoglobin: 10.6 g/dL — ABNORMAL LOW (ref 12.0–15.0)
Immature Granulocytes: 0 %
Lymphocytes Relative: 13 %
Lymphs Abs: 0.4 10*3/uL — ABNORMAL LOW (ref 0.7–4.0)
MCH: 35.1 pg — ABNORMAL HIGH (ref 26.0–34.0)
MCHC: 33.9 g/dL (ref 30.0–36.0)
MCV: 103.6 fL — ABNORMAL HIGH (ref 80.0–100.0)
Monocytes Absolute: 0.6 10*3/uL (ref 0.1–1.0)
Monocytes Relative: 18 %
Neutro Abs: 2.1 10*3/uL (ref 1.7–7.7)
Neutrophils Relative %: 62 %
Platelet Count: 98 10*3/uL — ABNORMAL LOW (ref 150–400)
RBC: 3.02 MIL/uL — ABNORMAL LOW (ref 3.87–5.11)
RDW: 17 % — ABNORMAL HIGH (ref 11.5–15.5)
WBC Count: 3.4 10*3/uL — ABNORMAL LOW (ref 4.0–10.5)
nRBC: 0 % (ref 0.0–0.2)

## 2019-09-29 LAB — CMP (CANCER CENTER ONLY)
ALT: 30 U/L (ref 0–44)
AST: 28 U/L (ref 15–41)
Albumin: 3.9 g/dL (ref 3.5–5.0)
Alkaline Phosphatase: 134 U/L — ABNORMAL HIGH (ref 38–126)
Anion gap: 11 (ref 5–15)
BUN: 19 mg/dL (ref 8–23)
CO2: 24 mmol/L (ref 22–32)
Calcium: 10 mg/dL (ref 8.9–10.3)
Chloride: 105 mmol/L (ref 98–111)
Creatinine: 0.85 mg/dL (ref 0.44–1.00)
GFR, Est AFR Am: >60 mL/min (ref >60)
GFR, Estimated: >60 mL/min (ref >60)
Glucose, Bld: 123 mg/dL — ABNORMAL HIGH (ref 70–99)
Potassium: 4 mmol/L (ref 3.5–5.1)
Sodium: 140 mmol/L (ref 135–145)
Total Bilirubin: 0.5 mg/dL (ref 0.3–1.2)
Total Protein: 7.6 g/dL (ref 6.5–8.1)

## 2019-09-29 MED ORDER — METFORMIN HCL ER 500 MG PO TB24: 500.0000 mg | ORAL_TABLET | Freq: Every day | ORAL | 1 refills | Status: DC

## 2019-09-29 MED ORDER — HEPARIN SOD (PORK) LOCK FLUSH 100 UNIT/ML IV SOLN
500.0000 [IU] | Freq: Once | INTRAVENOUS | Status: AC | PRN
  Administered 2019-09-29: 12:00:00 500 [IU]
  Filled 2019-09-29: qty 5

## 2019-09-29 MED ORDER — ACETAMINOPHEN 325 MG PO TABS
650.0000 mg | ORAL_TABLET | Freq: Once | ORAL | Status: AC
  Administered 2019-09-29: 09:00:00 650 mg via ORAL

## 2019-09-29 MED ORDER — DIPHENHYDRAMINE HCL 25 MG PO CAPS
ORAL_CAPSULE | ORAL | Status: AC
  Filled 2019-09-29: qty 2

## 2019-09-29 MED ORDER — SODIUM CHLORIDE 0.9 % IV SOLN
Freq: Once | INTRAVENOUS | Status: AC
  Filled 2019-09-29: qty 250

## 2019-09-29 MED ORDER — DIPHENHYDRAMINE HCL 25 MG PO CAPS
50.0000 mg | ORAL_CAPSULE | Freq: Once | ORAL | Status: AC
  Administered 2019-09-29: 09:00:00 50 mg via ORAL

## 2019-09-29 MED ORDER — SODIUM CHLORIDE 0.9% FLUSH
10.0000 mL | Freq: Once | INTRAVENOUS | Status: AC
  Administered 2019-09-29: 08:00:00 10 mL
  Filled 2019-09-29: qty 10

## 2019-09-29 MED ORDER — ACETAMINOPHEN 325 MG PO TABS
ORAL_TABLET | ORAL | Status: AC
  Filled 2019-09-29: qty 2

## 2019-09-29 MED ORDER — SODIUM CHLORIDE 0.9% FLUSH
10.0000 mL | INTRAVENOUS | Status: DC | PRN
  Administered 2019-09-29: 12:00:00 10 mL
  Filled 2019-09-29: qty 10

## 2019-09-29 MED ORDER — TRASTUZUMAB-DKST CHEMO 150 MG IV SOLR
8.0000 mg/kg | Freq: Once | INTRAVENOUS | Status: AC
  Administered 2019-09-29: 546 mg via INTRAVENOUS
  Filled 2019-09-29: qty 26

## 2019-09-29 NOTE — Assessment & Plan Note (Signed)
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 Neoadjuvant chemotherapy with [place] Perjeta 6 cyclescompleted 5/6/2020followed by Herceptin/ Perjeta vs Kadcylamaintenance for 1 year 2. Followed byBilmastectomieswith sentinel lymph node study 03/08/2019 3. Followed by adjuvant radiation therapystarted 05/04/2019-06/14/2019 4.Followed by adjuvant antiestrogen therapy started 06/16/2019 ------------------------------------------------------------------------------------------------------------------------------------ 03/08/2019: Bilateral mastectomies: Left mastectomy: Grade 2 IDC spanning 7.5 cm, margins negative, right mastectomy: Benign, ER 95%, PR 5%, HER-2 1+ negative pathology lost the lymph node excision.   Treatment plan: Kadcyla maintenance every 3 weeks with anastrozole 1 mg daily (treatment break given because of brain aneurysm surgery at [facility]), we resumed treatment with Herceptin alone starting 09/29/2019  Toxicities: 1.  Nausea: No further issues 2.  Thrombocytopenia: We reduced the dosage of her treatment 3.  Leukopenia: Monitoring closely. 4.  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  If it gets worse then I may have to switch her back to Herceptin.  Anastrozole toxicities: None Echocardiogram 09/24/2019: EF 60 to 65%  Return to clinic every 3 weeks with labs and follow-up on  Kadcyla.  She needs 3 more treatments.

## 2019-09-29 NOTE — Patient Instructions (Signed)
[facility] °Discharge Instructions for Patients Receiving Chemotherapy ° °Today you received the following chemotherapy agents Trastuzumab ° °To help prevent nausea and vomiting after your treatment, we encourage you to take your nausea medication as directed. °  °If you develop nausea and vomiting that is not controlled by your nausea medication, call the clinic.  ° °BELOW ARE SYMPTOMS THAT SHOULD BE REPORTED IMMEDIATELY: °· *FEVER GREATER THAN 100.5 F °· *CHILLS WITH OR WITHOUT FEVER °· NAUSEA AND VOMITING THAT IS NOT CONTROLLED WITH YOUR NAUSEA MEDICATION °· *UNUSUAL SHORTNESS OF BREATH °· *UNUSUAL BRUISING OR BLEEDING °· TENDERNESS IN MOUTH AND THROAT WITH OR WITHOUT PRESENCE OF ULCERS °· *URINARY PROBLEMS °· *BOWEL PROBLEMS °· UNUSUAL RASH °Items with * indicate a potential emergency and should be followed up as soon as possible. ° °Feel free to call the clinic should you have any questions or concerns. The clinic phone number is [phone]. ° °Please show the CHEMO ALERT CARD at check-in to the Emergency Department and triage nurse. ° ° °

## 2019-09-29 NOTE — Patient Instructions (Signed)

## 2019-09-30 ENCOUNTER — Telehealth: Payer: Self-pay | Admitting: Hematology and Oncology

## 2019-09-30 NOTE — Telephone Encounter (Signed)
I talk with patient regarding schedule  

## 2019-10-20 ENCOUNTER — Inpatient Hospital Stay: Payer: Medicare Other

## 2019-10-20 ENCOUNTER — Other Ambulatory Visit: Payer: Self-pay

## 2019-10-20 VITALS — BP 140/52 | HR 67 | Temp 98.2°F | Resp 18

## 2019-10-20 DIAGNOSIS — I10 Essential (primary) hypertension: Secondary | ICD-10-CM | POA: Diagnosis not present

## 2019-10-20 DIAGNOSIS — Z5112 Encounter for antineoplastic immunotherapy: Secondary | ICD-10-CM | POA: Diagnosis not present

## 2019-10-20 DIAGNOSIS — G620 Drug-induced polyneuropathy: Secondary | ICD-10-CM | POA: Diagnosis not present

## 2019-10-20 DIAGNOSIS — C50412 Malignant neoplasm of upper-outer quadrant of left female breast: Secondary | ICD-10-CM

## 2019-10-20 DIAGNOSIS — Z79811 Long term (current) use of aromatase inhibitors: Secondary | ICD-10-CM | POA: Diagnosis not present

## 2019-10-20 DIAGNOSIS — Z170 Estrogen receptor positive status [ER+]: Secondary | ICD-10-CM | POA: Diagnosis not present

## 2019-10-20 MED ORDER — SODIUM CHLORIDE 0.9% FLUSH
10.0000 mL | INTRAVENOUS | Status: DC | PRN
  Administered 2019-10-20: 10 mL
  Filled 2019-10-20: qty 10

## 2019-10-20 MED ORDER — TRASTUZUMAB-DKST CHEMO 150 MG IV SOLR
6.0000 mg/kg | Freq: Once | INTRAVENOUS | Status: AC
  Administered 2019-10-20: 10:00:00 399 mg via INTRAVENOUS
  Filled 2019-10-20: qty 19

## 2019-10-20 MED ORDER — SODIUM CHLORIDE 0.9 % IV SOLN
Freq: Once | INTRAVENOUS | Status: AC
  Filled 2019-10-20: qty 250

## 2019-10-20 MED ORDER — DIPHENHYDRAMINE HCL 25 MG PO CAPS
ORAL_CAPSULE | ORAL | Status: AC
  Filled 2019-10-20: qty 2

## 2019-10-20 MED ORDER — ACETAMINOPHEN 325 MG PO TABS
650.0000 mg | ORAL_TABLET | Freq: Once | ORAL | Status: AC
  Administered 2019-10-20: 650 mg via ORAL

## 2019-10-20 MED ORDER — DIPHENHYDRAMINE HCL 25 MG PO CAPS
50.0000 mg | ORAL_CAPSULE | Freq: Once | ORAL | Status: AC
  Administered 2019-10-20: 50 mg via ORAL

## 2019-10-20 MED ORDER — HEPARIN SOD (PORK) LOCK FLUSH 100 UNIT/ML IV SOLN
500.0000 [IU] | Freq: Once | INTRAVENOUS | Status: AC | PRN
  Administered 2019-10-20: 500 [IU]
  Filled 2019-10-20: qty 5

## 2019-10-20 MED ORDER — ACETAMINOPHEN 325 MG PO TABS
ORAL_TABLET | ORAL | Status: AC
  Filled 2019-10-20: qty 2

## 2019-10-20 NOTE — Patient Instructions (Signed)
[facility] °Discharge Instructions for Patients Receiving Chemotherapy ° °Today you received the following chemotherapy agents Trastuzumab ° °To help prevent nausea and vomiting after your treatment, we encourage you to take your nausea medication as directed. °  °If you develop nausea and vomiting that is not controlled by your nausea medication, call the clinic.  ° °BELOW ARE SYMPTOMS THAT SHOULD BE REPORTED IMMEDIATELY: °· *FEVER GREATER THAN 100.5 F °· *CHILLS WITH OR WITHOUT FEVER °· NAUSEA AND VOMITING THAT IS NOT CONTROLLED WITH YOUR NAUSEA MEDICATION °· *UNUSUAL SHORTNESS OF BREATH °· *UNUSUAL BRUISING OR BLEEDING °· TENDERNESS IN MOUTH AND THROAT WITH OR WITHOUT PRESENCE OF ULCERS °· *URINARY PROBLEMS °· *BOWEL PROBLEMS °· UNUSUAL RASH °Items with * indicate a potential emergency and should be followed up as soon as possible. ° °Feel free to call the clinic should you have any questions or concerns. The clinic phone number is [phone]. ° °Please show the CHEMO ALERT CARD at check-in to the Emergency Department and triage nurse. ° ° °

## 2019-10-21 ENCOUNTER — Telehealth: Payer: Self-pay | Admitting: Family Medicine

## 2019-10-21 DIAGNOSIS — E113293 Type 2 diabetes mellitus with mild nonproliferative diabetic retinopathy without macular edema, bilateral: Secondary | ICD-10-CM

## 2019-10-21 NOTE — Telephone Encounter (Signed)
-----   Message from Ellamae Sia sent at 10/08/2019  9:49 AM EST ----- Regarding: Lab orders for Friday, 2.26.21 Lab orders for a f/u appt

## 2019-10-22 ENCOUNTER — Other Ambulatory Visit: Payer: Self-pay

## 2019-10-22 ENCOUNTER — Other Ambulatory Visit (INDEPENDENT_AMBULATORY_CARE_PROVIDER_SITE_OTHER): Payer: Medicare Other

## 2019-10-22 DIAGNOSIS — E113293 Type 2 diabetes mellitus with mild nonproliferative diabetic retinopathy without macular edema, bilateral: Secondary | ICD-10-CM | POA: Diagnosis not present

## 2019-10-22 LAB — COMPREHENSIVE METABOLIC PANEL
ALT: 43 U/L — ABNORMAL HIGH (ref 0–35)
AST: 34 U/L (ref 0–37)
Albumin: 4 g/dL (ref 3.5–5.2)
Alkaline Phosphatase: 120 U/L — ABNORMAL HIGH (ref 39–117)
BUN: 23 mg/dL (ref 6–23)
CO2: 27 mEq/L (ref 19–32)
Calcium: 10.5 mg/dL (ref 8.4–10.5)
Chloride: 104 mEq/L (ref 96–112)
Creatinine, Ser: 0.9 mg/dL (ref 0.40–1.20)
GFR: 61.06 mL/min (ref >60.00)
Glucose, Bld: 151 mg/dL — ABNORMAL HIGH (ref 70–99)
Potassium: 3.9 mEq/L (ref 3.5–5.1)
Sodium: 138 mEq/L (ref 135–145)
Total Bilirubin: 0.5 mg/dL (ref 0.2–1.2)
Total Protein: 7.3 g/dL (ref 6.0–8.3)

## 2019-10-22 LAB — LIPID PANEL
Cholesterol: 164 mg/dL (ref 0–200)
HDL: 41.2 mg/dL (ref >39.00)
NonHDL: 122.43
Total CHOL/HDL Ratio: 4
Triglycerides: 263 mg/dL — ABNORMAL HIGH (ref 0.0–149.0)
VLDL: 52.6 mg/dL — ABNORMAL HIGH (ref 0.0–40.0)

## 2019-10-22 LAB — LDL CHOLESTEROL, DIRECT: Direct LDL: 77 mg/dL

## 2019-10-22 NOTE — Progress Notes (Signed)
No critical labs need to be addressed urgently. We will discuss labs in detail at upcoming office visit.   

## 2019-10-22 NOTE — Addendum Note (Signed)
Addended by: Ellamae Sia on: 10/22/2019 07:52 AM   Modules accepted: Orders

## 2019-10-23 LAB — HEMOGLOBIN A1C
Hgb A1c MFr Bld: 5.6 % of total Hgb (ref <5.7)
Mean Plasma Glucose: 114 (calc)
eAG (mmol/L): 6.3 (calc)

## 2019-10-25 NOTE — Progress Notes (Signed)
No critical labs need to be addressed urgently. We will discuss labs in detail at upcoming office visit.   

## 2019-10-29 ENCOUNTER — Other Ambulatory Visit: Payer: Self-pay

## 2019-10-29 ENCOUNTER — Ambulatory Visit (INDEPENDENT_AMBULATORY_CARE_PROVIDER_SITE_OTHER): Payer: Medicare Other | Admitting: Family Medicine

## 2019-10-29 ENCOUNTER — Encounter: Payer: Self-pay | Admitting: Family Medicine

## 2019-10-29 VITALS — BP 158/62 | HR 83 | Temp 98.3°F | Ht 62.0 in | Wt 151.8 lb

## 2019-10-29 DIAGNOSIS — I10 Essential (primary) hypertension: Secondary | ICD-10-CM | POA: Diagnosis not present

## 2019-10-29 DIAGNOSIS — G620 Drug-induced polyneuropathy: Secondary | ICD-10-CM | POA: Diagnosis not present

## 2019-10-29 DIAGNOSIS — I720 Aneurysm of carotid artery: Secondary | ICD-10-CM

## 2019-10-29 DIAGNOSIS — E113293 Type 2 diabetes mellitus with mild nonproliferative diabetic retinopathy without macular edema, bilateral: Secondary | ICD-10-CM

## 2019-10-29 DIAGNOSIS — E7800 Pure hypercholesterolemia, unspecified: Secondary | ICD-10-CM | POA: Diagnosis not present

## 2019-10-29 DIAGNOSIS — C50912 Malignant neoplasm of unspecified site of left female breast: Secondary | ICD-10-CM

## 2019-10-29 DIAGNOSIS — T451X5A Adverse effect of antineoplastic and immunosuppressive drugs, initial encounter: Secondary | ICD-10-CM | POA: Diagnosis not present

## 2019-10-29 NOTE — Assessment & Plan Note (Signed)
Stable control on gabapentin.  

## 2019-10-29 NOTE — Assessment & Plan Note (Signed)
At goal on metfomrin

## 2019-10-29 NOTE — Assessment & Plan Note (Signed)
LDL at goal on statin. Trigs elevated.. start omega threes

## 2019-10-29 NOTE — Patient Instructions (Addendum)
Can start omega 3 fatty acids 2000 mg divided daily. Keep up working on healthy eating and regular exercise.

## 2019-10-29 NOTE — Assessment & Plan Note (Signed)
Reviewed [facility] records.. s/P stenting in 07/2019

## 2019-10-29 NOTE — Assessment & Plan Note (Signed)
Well controlled on amlodipine at home.. element of white coat HTN.

## 2019-10-29 NOTE — Assessment & Plan Note (Signed)
Currently active treatment with  Chemo.. followed by ONC. Se of neuropathy.  This may be wht ALT slightly elevated. Stop tylenol... will be followed by ONC closely.. has reeat CMETnext week.

## 2019-10-29 NOTE — Progress Notes (Signed)
Chief Complaint  Patient presents with  . Diabetes    History of Present Illness: HPI  74 year old female presents for follow up Dm.  Diabetes:   Great control on metformin. Lab Results  Component Value Date   HGBA1C 5.6 10/22/2019  Using medications without difficulties: Hypoglycemic episodes:none Hyperglycemic episodes: none Feet problems: no ulcers Blood Sugars averaging: FBS 125 eye exam within last year: due.. scheduled  Hypertension:    Elevated in office today on  Amlodipine 10 mg daily.. but has some white coat HTN BP Readings from Last 3 Encounters:  10/29/19 (!) 158/62  10/20/19 (!) 140/52  09/29/19 (!) 146/60    Wt Readings from Last 3 Encounters:  10/29/19 151 lb 12 oz (68.8 kg)  09/29/19 148 lb 4.8 oz (67.3 kg)  09/08/19 148 lb 3.2 oz (67.2 kg)  Using medication without problems or lightheadedness:  none Chest pain with exertion:none Edema:none Short of breath:none Average home BPs: 130/80s Other issues:  Elevated Cholesterol: LDL at goal < 100 on statin. Trigs better but remain high. Using medications without problems:none Muscle aches:  none Diet compliance: good Exercise: walking.. on treadmill Other complaints:  Has gotten COVID vaccines x 2.   ALT slightly elevated... started back chemo feb 3.. has 2 left. Some neuropathy from  Chemo... was taking some tylenol.  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  On Brilinta for 90 days  This visit occurred during the SARS-CoV-2 public health emergency.  Safety protocols were in place, including screening questions prior to the visit, additional usage of staff PPE, and extensive cleaning of exam room while observing appropriate contact time as indicated for disinfecting solutions.   COVID 19 screen:  No recent travel or known exposure to COVID19 The patient denies respiratory symptoms of COVID 19 at this time. The importance of social distancing was  discussed today.     Review of Systems  Constitutional: Negative for chills and fever.  HENT: Negative for congestion and ear pain.   Eyes: Negative for pain and redness.  Respiratory: Negative for cough and shortness of breath.   Cardiovascular: Negative for chest pain, palpitations and leg swelling.  Gastrointestinal: Negative for abdominal pain, blood in stool, constipation, diarrhea, nausea and vomiting.  Genitourinary: Negative for dysuria.  Musculoskeletal: Negative for falls and myalgias.  Skin: Negative for rash.  Neurological: Negative for dizziness.  Psychiatric/Behavioral: Negative for depression. The patient is not nervous/anxious.       Past Medical History:  Diagnosis Date  . Allergy   . Anemia   . Aneurysm ([place])    in brain, small, not treating-watching   . Blood transfusion without reported diagnosis   . Cancer ([place])    breast 08/2018  . Diverticulitis   . DJD (degenerative joint disease)   . DM type 2 (diabetes mellitus, type 2) ([place])   . Family history of breast cancer   . Fibrocystic breast disease   . GERD (gastroesophageal reflux disease)   . History of chicken pox   . Hypercholesteremia   . Hypertension   . Lichen planopilaris   . Urinary incontinence     reports that she has never smoked. She has never used smokeless tobacco. She reports previous alcohol use. She reports that she does not use drugs.   Current Outpatient Medications:  .  amLODipine (NORVASC) 10 MG tablet, Take 1 tablet (10 mg total) by mouth daily., Disp: 90 tablet, Rfl: 3 .  anastrozole (ARIMIDEX)  1 MG tablet, Take 1 tablet (1 mg total) by mouth daily., Disp: 90 tablet, Rfl: 3 .  aspirin 81 MG EC tablet, Take 81 mg by mouth daily. Swallow whole., Disp: , Rfl:  .  atorvastatin (LIPITOR) 10 MG tablet, TAKE 1 TABLET(10 MG) BY MOUTH DAILY, Disp: 90 tablet, Rfl: 3 .  BRILINTA 90 MG TABS tablet, Take 90 mg by mouth 2 (two) times daily., Disp: , Rfl:  .  Calcium Carbonate-Vit D-Min  (CALTRATE 600+D PLUS MINERALS) 600-800 MG-UNIT TABS, Take 1 tablet by mouth in the morning and at bedtime., Disp: , Rfl:  .  clobetasol (TEMOVATE) 0.05 % external solution, Apply 1 application topically 2 (two) times daily as needed (scalp). Reported on 08/16/2015, Disp: , Rfl:  .  dexamethasone (DECADRON) 0.1 % ophthalmic suspension, Place 1 drop into both eyes 2 (two) times daily., Disp: 15 mL, Rfl: 1 .  fluticasone (FLONASE) 50 MCG/ACT nasal spray, SHAKE LIQUID AND USE 2 SPRAYS IN EACH NOSTRIL EVERY DAY, Disp: 16 g, Rfl: 5 .  gabapentin (NEURONTIN) 300 MG capsule, Take 1 capsule (300 mg total) by mouth at bedtime., Disp: 90 capsule, Rfl: 3 .  glucose blood test strip, USE TO CHECK BLOOD SUGAR DAILY, Disp: 50 each, Rfl: 12 .  loratadine (CLARITIN) 10 MG tablet, Take 10 mg by mouth daily., Disp: , Rfl:  .  metFORMIN (GLUCOPHAGE-XR) 500 MG 24 hr tablet, Take 1 tablet (500 mg total) by mouth at bedtime., Disp: 90 tablet, Rfl: 1 .  Multiple Vitamins-Minerals (CENTRUM SILVER 50+WOMEN) TABS, Take 1 tablet by mouth daily., Disp: , Rfl:    Observations/Objective: Blood pressure (!) 158/62, pulse 83, temperature 98.3 F (36.8 C), temperature source Temporal, height 5\' 2"  (1.575 m), weight 151 lb 12 oz (68.8 kg), last menstrual period 08/26/1992, SpO2 97 %.  Physical Exam Constitutional:      General: She is not in acute distress.    Appearance: Normal appearance. She is well-developed. She is not ill-appearing or toxic-appearing.  HENT:     Head: Normocephalic.     Right Ear: Hearing, tympanic membrane, ear canal and external ear normal. Tympanic membrane is not erythematous, retracted or bulging.     Left Ear: Hearing, tympanic membrane, ear canal and external ear normal. Tympanic membrane is not erythematous, retracted or bulging.     Nose: No mucosal edema or rhinorrhea.     Right Sinus: No maxillary sinus tenderness or frontal sinus tenderness.     Left Sinus: No maxillary sinus tenderness or  frontal sinus tenderness.     Mouth/Throat:     Pharynx: Uvula midline.  Eyes:     General: Lids are normal. Lids are everted, no foreign bodies appreciated.     Conjunctiva/sclera: Conjunctivae normal.     Pupils: Pupils are equal, round, and reactive to light.  Neck:     Thyroid: No thyroid mass or thyromegaly.     Vascular: No carotid bruit.     Trachea: Trachea normal.  Cardiovascular:     Rate and Rhythm: Normal rate and regular rhythm.     Pulses: Normal pulses.     Heart sounds: Normal heart sounds, S1 normal and S2 normal. No murmur. No friction rub. No gallop.   Pulmonary:     Effort: Pulmonary effort is normal. No tachypnea or respiratory distress.     Breath sounds: Normal breath sounds. No decreased breath sounds, wheezing, rhonchi or rales.  Abdominal:     General: Bowel sounds are normal.  Palpations: Abdomen is soft.     Tenderness: There is no abdominal tenderness.  Musculoskeletal:     Cervical back: Normal range of motion and neck supple.  Skin:    General: Skin is warm and dry.     Findings: No rash.  Neurological:     Mental Status: She is alert.  Psychiatric:        Mood and Affect: Mood is not anxious or depressed.        Speech: Speech normal.        Behavior: Behavior normal. Behavior is cooperative.        Thought Content: Thought content normal.        Judgment: Judgment normal.      Assessment and Plan   Essential hypertension, benign Well controlled on amlodipine at home.. element of white coat HTN.  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 followed by ONC. Se of neuropathy.  This may be wht ALT slightly elevated. Stop tylenol... will be followed by ONC closely.. has reeat CMETnext week.  High cholesterol LDL at goal on statin. Trigs elevated.. 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 s/P  stenting in 07/2019  Neuropathy due to chemotherapeutic drug ([place])  Stable control on gabapentin.     Eliezer Lofts, MD

## 2019-11-09 ENCOUNTER — Other Ambulatory Visit: Payer: Self-pay | Admitting: *Deleted

## 2019-11-09 DIAGNOSIS — C50412 Malignant neoplasm of upper-outer quadrant of left female breast: Secondary | ICD-10-CM

## 2019-11-09 NOTE — Progress Notes (Signed)
[facility] Follow up:    Katie Sanders, MD [place] [address]   DIAGNOSIS: Cancer Staging Malignant neoplasm of upper-outer quadrant of left breast in female, estrogen receptor positive ([place]) Staging form: Breast, AJCC 8th Edition - Clinical stage from 09/16/2018: Stage IIIB (cT4, cN0, cM0, G3, ER+, PR+, HER2+) - Signed by [name], MD on 09/16/2018 - Pathologic stage from 03/08/2019: No Stage Recommended (ypT3, pNX, cM0, G2, ER+, PR+, HER2-) - Signed by [name], MD on 04/21/2019   SUMMARY OF ONCOLOGIC HISTORY: Oncology History  Malignant neoplasm of upper-outer quadrant of left breast in female, estrogen receptor positive ([place])  09/14/2018 Initial Diagnosis   With prior history of breast implants that were ruptured/replaced, patient had cellulitis LIQ left breast for 1 week, and UOQ asymmetry 1.5 cm, MRI right breast biopsy benign, left breast numerous masses 7 cm skin thickening: 2 biopsies from left breast: Grade 2-3 IDC ER PR positive, HER-2 positive, Ki-67 50%   09/16/2018 Cancer Staging   Staging form: Breast, AJCC 8th Edition - Clinical stage from 09/16/2018: Stage IIIB (cT4, cN0, cM0, G3, ER+, PR+, HER2+) - Signed by [name], MD on 09/16/2018   09/29/2018 -  Neo-Adjuvant Chemotherapy   TCHP q 3 weeks   01/19/2019 Breast MRI   Significant interval improvement, the multiple left breast masses and areas of non mass enhancement in all 4 quadrants have decreased in size and extent. The mass previously measuring 1.4 cm with diffuse enhancement now measures 9 mm with heterogeneous areas of enhancement. Current greatest transverse dimension is 5.4 x 3.9 cm, previously 7 cm x 5cm. 2. 4 mm masslike focus of enhancement at the base of the right nipple is unchanged.   02/10/2019 - 03/23/2019 Chemotherapy   The patient had trastuzumab (HERCEPTIN) 450 mg in sodium chloride 0.9 % 250 mL chemo infusion, 420 mg (100 % of original dose 6  mg/kg), Intravenous,  Once, 2 of 5 cycles Dose modification: 6 mg/kg (original dose 6 mg/kg, Cycle 1, Reason: Provider Judgment) Administration: 450 mg (02/10/2019), 450 mg (03/03/2019)  for chemotherapy treatment.    03/08/2019 Surgery   Bilateral mastectomies [name]: right breast: no malignancy; left breast: IDC, grade 2, scattered over 7.5cm, clear margins.    03/08/2019 Cancer Staging   Staging form: Breast, AJCC 8th Edition - Pathologic stage from 03/08/2019: No Stage Recommended (ypT3, pNX, cM0, G2, ER+, PR+, HER2-) - Signed by [name], MD on 04/21/2019   03/24/2019 - 07/27/2019 Chemotherapy   The patient had ado-trastuzumab emtansine (KADCYLA) 260 mg in sodium chloride 0.9 % 250 mL chemo infusion, 3.6 mg/kg = 260 mg, Intravenous, Once, 6 of 10 cycles Dose modification: 3 mg/kg (original dose 3.6 mg/kg, Cycle 5, Reason: Dose not tolerated), 2.4 mg/kg (original dose 3.6 mg/kg, Cycle 6, Reason: Provider Judgment) Administration: 260 mg (03/24/2019), 260 mg (04/14/2019), 200 mg (06/16/2019), 260 mg (05/05/2019), 200 mg (05/26/2019), 160 mg (07/07/2019)  for chemotherapy treatment.    05/04/2019 - 06/14/2019 Radiation Therapy   Adjuvant radiation therapy   06/2019 -  Anti-estrogen oral therapy   Anastrozole daily   09/29/2019 -  Chemotherapy   The patient had trastuzumab-dkst (OGIVRI) 546 mg in sodium chloride 0.9 % 250 mL chemo infusion, 8 mg/kg = 546 mg, Intravenous,  Once, 3 of 4 cycles Administration: 546 mg (09/29/2019), 399 mg (10/20/2019)  for chemotherapy treatment.      CURRENT THERAPY: Herceptin every 3 weeks; Anastrozole  INTERVAL HISTORY: Katie [name] [address]  y.o. female returns for evaluation prior to receiving Herceptin.  She receives this every 3 weeks.  Her most recent echocardiogram was on 09/24/2019 and showed and EF of 60-65%.  She says she tolerates this well.    She also takes Anastrozole daily.  She is tolerating this well.  She has some right elbow pain, but otherwise  has no hot flashes or vaginal dryness.     Patient Active Problem List   Diagnosis Date Noted  . Neuropathy due to chemotherapeutic drug ([place]) 10/29/2019  . Epigastric pain 04/30/2019  . Breast cancer, left breast ([name]) 03/08/2019  . 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 Lightheaded 01/05/2019  . Urine WBC increased 01/05/2019  . Diarrhea 10/15/2018  . Dehydration 10/15/2018  . Anemia due to chemotherapy 10/15/2018  . Port-A-Cath in place 09/29/2018  . Diabetic retinopathy ([name]) 09/29/2018  . Genetic testing 09/23/2018  . Family history of leukemia 09/16/2018  . Family history of breast cancer   . Malignant neoplasm of upper-outer quadrant of left breast in female, estrogen receptor positive ([name]) 09/11/2018  . Mastitis 08/28/2018  . H/O bilateral breast implants 08/28/2018  . Family history of breast cancer in sister 08/28/2018  . Pre-op examination 02/17/2018  . Diverticular disease 10/17/2017  . Primary osteoarthritis of right knee 03/07/2017  . 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 Vitamin D deficiency 10/11/2016  . Family history of thyroid disorder 04/04/2015  . 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  . Counseling regarding end of life decision making 10/04/2014  . 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 History of duodenal ulcer 08/02/2014  . Seasonal allergies 08/02/2014  . Essential hypertension, benign 08/02/2014  . High cholesterol 08/02/2014  . 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 History of joint replacement 03/01/2014  . Controlled type 2 diabetes with retinopathy ([name]) 11/07/2009    is allergic to erythromycin; ciprofloxacin; daypro [oxaprozin]; enalapril maleate; nsaids; and vioxx [rofecoxib].  MEDICAL HISTORY: Past Medical History:  Diagnosis Date  . Allergy   . Anemia   . 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 Blood transfusion without reported diagnosis   . Cancer ([place])    breast 08/2018  .  Diverticulitis   . DJD (degenerative joint disease)   . DM type 2 (diabetes mellitus, type 2) ([place])   . Family history of breast cancer   . Fibrocystic breast disease   . GERD (gastroesophageal reflux disease)   . History of chicken pox   . Hypercholesteremia   . Hypertension   . Lichen planopilaris   . Urinary incontinence     SURGICAL HISTORY: Past Surgical History:  Procedure Laterality Date  . BREAST IMPLANT REMOVAL Bilateral 03/08/2019   Procedure: Removal of Bilateral Breast Implants;  Surgeon: Rolm Bookbinder, MD;  Location: [facility];  Service: General;  Laterality: Bilateral;  . BREAST SURGERY  02/1975   Left Breast Biopsy-Fibrocystic Disease  . BREAST SURGERY  10/1981   Bialteral Capsulectomy Breast Surgery  . BREAST SURGERY  07/1998   Replace Bilateral Silicone Implants  . BREAST SURGERY  01/1976   Bilater breast surgery to remove fibrocystic tissue and replace with silicone implants  . CHOLECYSTECTOMY N/A 07/08/2016   Procedure: LAPAROSCOPIC CHOLECYSTECTOMY;  Surgeon: [name], MD;  Location: [facility];  Service: General;  Laterality: N/A;  . COLONOSCOPY WITH PROPOFOL N/A 11/07/2017   Procedure: COLONOSCOPY WITH PROPOFOL;  Surgeon: Jonathon Bellows, MD;  Location: [facility];  Service: Gastroenterology;  Laterality: N/A;  .  DILATION AND CURETTAGE OF UTERUS  01/1994  . ESOPHAGOGASTRODUODENOSCOPY (EGD) WITH PROPOFOL N/A 12/05/2016   Procedure: ESOPHAGOGASTRODUODENOSCOPY (EGD) WITH PROPOFOL;  Surgeon: Jonathon Bellows, MD;  Location: [facility] ENDOSCOPY;  Service: Endoscopy;  Laterality: N/A;  . FINGER SURGERY  2001 & 2003   Trigger Finger  . FOOT SURGERY  1980   To Relieve Pinched Nerve  . FOOT SURGERY  07/2008   Left Foot-Plantar Fasciitis  . JOINT REPLACEMENT  04/19/2013   Hip Replacement-Right  . MASTECTOMY W/ SENTINEL NODE BIOPSY Bilateral 03/08/2019   Procedure: BILATERAL MASTECTOMIES WITH LEFT AXILLARY SENTINEL LYMPH NODE BIOPSY AND BLUE DYE INJECTION;  Surgeon: Rolm Bookbinder, MD;  Location: [facility];  Service: General;  Laterality: Bilateral;  . PLACEMENT OF BREAST IMPLANTS  12/26/2004   Replace Failed Implants  . REPLACEMENT TOTAL KNEE Left    2019-  . RHINOPLASTY  03/1964   To Correct Deviated Septum    SOCIAL HISTORY: Social History   Socioeconomic History  . Marital status: Married    Spouse name: Not on file  . Number of children: 1  . Years of education: Not on file  . Highest education level: Not on file  Occupational History  . Occupation: Retired  Tobacco Use  . Smoking status: Never Smoker  . Smokeless tobacco: Never Used  Substance and Sexual Activity  . Alcohol use: Not Currently    Alcohol/week: 0.0 standard drinks    Comment: rarely  . Drug use: No  . Sexual activity: Not Currently    Partners: Male  Other Topics Concern  . Not on file  Social History Narrative   Married for 37 years.    She has one child and one stepston.      Social Determinants of Health   Financial Resource Strain:   . Difficulty of Paying Living Expenses:   Food Insecurity:   . Worried About Charity fundraiser in the Last Year:   . Arboriculturist in the Last Year:   Transportation Needs: No Transportation Needs  . Lack of Transportation (Medical): No  . Lack of Transportation (Non-Medical): No  Physical Activity:   . Days of Exercise per Week:   . Minutes of Exercise per Session:   Stress:   . Feeling of Stress :   Social Connections:   . Frequency of Communication with Friends and Family:   . Frequency of Social Gatherings with Friends and Family:   . Attends Religious Services:   . Active Member of Clubs or Organizations:   . Attends Archivist Meetings:   [name] Marital Status:   Intimate Partner Violence: Not At Risk  . Fear of Current or Ex-Partner: No  . Emotionally Abused: No  . Physically Abused: No  . Sexually Abused: No    FAMILY HISTORY: Family History  Problem Relation Age of Onset  . Arthritis Mother   .  Hypertension Mother   . Heart disease Father   . Diabetes Father   . Breast cancer Sister 32       d. 7  . Hypertension Brother   . Leukemia Brother   . Coronary artery disease Brother     Review of Systems  Constitutional: Negative for appetite change, chills, fatigue, fever and unexpected weight change.  HENT:   Negative for hearing loss and lump/mass.   Eyes: Negative for eye problems and icterus.  Respiratory: Negative for chest tightness, cough and shortness of breath.   Cardiovascular: Negative for chest pain, leg  swelling and palpitations.  Gastrointestinal: Negative for abdominal distention, abdominal pain, blood in stool, constipation, diarrhea, nausea and vomiting.  Endocrine: Negative for hot flashes.  Genitourinary: Negative for difficulty urinating.   Musculoskeletal: Negative for arthralgias.  Skin: Negative for itching and rash.  Neurological: Negative for dizziness, extremity weakness, headaches and numbness.  Hematological: Negative for adenopathy. Bruises/bleeds easily (taking Brilinta).  Psychiatric/Behavioral: Negative for depression. The patient is not nervous/anxious.       PHYSICAL EXAMINATION  ECOG PERFORMANCE STATUS: 1 - Symptomatic but completely ambulatory  Vitals:   11/10/19 0833  BP: (!) 153/58  Pulse: 84  Resp: 18  Temp: 98.3 F (36.8 C)  SpO2: 97%    Physical Exam Constitutional:      General: She is not in acute distress.    Appearance: Normal appearance. She is not toxic-appearing.  HENT:     Head: Normocephalic and atraumatic.     Mouth/Throat:     Comments: Mask in place  Eyes:     General: No scleral icterus. Cardiovascular:     Rate and Rhythm: Normal rate and regular rhythm.     Pulses: Normal pulses.     Heart sounds: Normal heart sounds.  Pulmonary:     Effort: Pulmonary effort is normal.     Breath sounds: Normal breath sounds.  Abdominal:     General: Abdomen is flat. Bowel sounds are normal. There is no  distension.     Palpations: Abdomen is soft.     Tenderness: There is no abdominal tenderness.  Musculoskeletal:        General: No swelling.     Cervical back: Neck supple.  Lymphadenopathy:     Cervical: No cervical adenopathy.  Skin:    General: Skin is warm and dry.     Findings: No rash.  Neurological:     General: No focal deficit present.     Mental Status: She is alert.  Psychiatric:        Mood and Affect: Mood normal.        Behavior: Behavior normal.     LABORATORY DATA:  CBC    Component Value Date/Time   WBC 3.9 (L) 11/10/2019 0820   WBC 4.7 03/09/2019 0231   RBC 3.24 (L) 11/10/2019 0820   HGB 11.5 (L) 11/10/2019 0820   HGB 10.0 (L) 04/29/2013 0812   HCT 34.1 (L) 11/10/2019 0820   HCT 29.1 (L) 04/29/2013 0812   PLT 120 (L) 11/10/2019 0820   PLT 378 04/29/2013 0812   MCV 105.2 (H) 11/10/2019 0820   MCV 95 04/29/2013 0812   MCH 35.5 (H) 11/10/2019 0820   MCHC 33.7 11/10/2019 0820   RDW 14.5 11/10/2019 0820   RDW 15.5 (H) 04/29/2013 0812   LYMPHSABS 0.7 11/10/2019 0820   LYMPHSABS 1.4 04/29/2013 0812   MONOABS 0.5 11/10/2019 0820   MONOABS 0.7 04/29/2013 0812   EOSABS 0.2 11/10/2019 0820   EOSABS 0.4 04/29/2013 0812   BASOSABS 0.0 11/10/2019 0820   BASOSABS 0.1 04/29/2013 0812    CMP     Component Value Date/Time   NA 138 10/22/2019 0755   K 3.9 10/22/2019 0755   CL 104 10/22/2019 0755   CO2 27 10/22/2019 0755   GLUCOSE 151 (H) 10/22/2019 0755   BUN 23 10/22/2019 0755   CREATININE 0.90 10/22/2019 0755   CREATININE 0.85 09/29/2019 0826   CALCIUM 10.5 10/22/2019 0755   PROT 7.3 10/22/2019 0755   ALBUMIN 4.0 10/22/2019 0755   AST 34 10/22/2019  0755   AST 28 09/29/2019 0826   ALT 43 (H) 10/22/2019 0755   ALT 30 09/29/2019 0826   ALKPHOS 120 (H) 10/22/2019 0755   BILITOT 0.5 10/22/2019 0755   BILITOT 0.5 09/29/2019 0826   GFRNONAA >60 09/29/2019 0826   GFRAA >60 09/29/2019 0826     ASSESSMENT and PLAN:   Malignant neoplasm of  upper-outer quadrant of left breast in female, estrogen receptor positive ([place]) With prior history of breast implants that were ruptured/replaced, patient had cellulitis LIQ left breast for 1 week, and UOQ asymmetry 1.5 cm, MRI right breast biopsy benign, left breast numerous masses 7 cm skin thickening: 2 biopsies from left breast: Grade 2-3 IDC ER PR positive, HER-2 positive, Ki-67 50% Clinical stage: T4 N0 M0 stage IIIb  Treatment plan 1. Neoadjuvant chemotherapy with [place] Perjeta 6 cyclescompleted 5/6/2020followed by Herceptin/ Perjeta vs Kadcylamaintenance for 1 year 2. Followed byBilmastectomieswith sentinel lymph node study 03/08/2019 3. Followed by adjuvant radiation therapystarted 05/04/2019-06/14/2019 4.Followed by adjuvant antiestrogen therapy started 06/16/2019 ------------------------------------------------------------------------------------------------------------------------------------ 03/08/2019: Bilateral mastectomies: Left mastectomy: Grade 2 IDC spanning 7.5 cm, margins negative, right mastectomy: Benign, ER 95%, PR 5%, HER-2 1+ negative pathology lost the lymph node excision.   Treatment plan: Kadcyla maintenance every 3 weeks with anastrozole 1 mg daily (treatment break given because of brain aneurysm surgery at [facility]), Herceptin alone  starting 09/29/2019  She is tolerating her treatment well.  She has no toxicities.   Gabapentin helps the peripheral neuropathy she experiences at night. Her left elbow pain is likely lateral epicondylitis.  Would recommend f/u with PCP.   Anastrozole toxicities: None Echocardiogram 09/24/2019: EF 60 to 65%  She will see Dr. Lindi Adie in 11/2019 for labs, f/u and treatment, and I will see her in 01/2020 for SCP visit.        All questions were answered. The patient knows to call the clinic with any problems, questions or concerns. We can certainly see the patient much sooner if necessary.  Total encounter time: 20  minutes   Wilber Bihari, NP 11/10/19 9:22 AM Medical Oncology and Hematology [facility] [place], [place] [address] Tel. [phone]    Fax. [phone]   *Total Encounter Time as defined by the Centers for Medicare and Medicaid Services includes, in addition to the face-to-face time of a patient visit (documented in the note above) non-face-to-face time: obtaining and reviewing outside history, ordering and reviewing medications, tests or procedures, care coordination (communications with other health care professionals or caregivers) and documentation in the medical record.

## 2019-11-09 NOTE — Assessment & Plan Note (Addendum)
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 Neoadjuvant chemotherapy with [place] Perjeta 6 cyclescompleted 5/6/2020followed by Herceptin/ Perjeta vs Kadcylamaintenance for 1 year 2. Followed byBilmastectomieswith sentinel lymph node study 03/08/2019 3. Followed by adjuvant radiation therapystarted 05/04/2019-06/14/2019 4.Followed by adjuvant antiestrogen therapy started 06/16/2019 ------------------------------------------------------------------------------------------------------------------------------------ 03/08/2019: Bilateral mastectomies: Left mastectomy: Grade 2 IDC spanning 7.5 cm, margins negative, right mastectomy: Benign, ER 95%, PR 5%, HER-2 1+ negative pathology lost the lymph node excision.   Treatment plan: Kadcyla maintenance every 3 weeks with anastrozole 1 mg daily (treatment break given because of brain aneurysm surgery at [facility]), Herceptin alone  starting 09/29/2019  She is tolerating her treatment well.  She has no toxicities.   Gabapentin helps the peripheral neuropathy she experiences at night. Her left elbow pain is likely lateral epicondylitis.  Would recommend f/u with PCP.   Anastrozole toxicities: None Echocardiogram 09/24/2019: EF 60 to 65%  She will see Dr. Lindi Adie in 11/2019 for labs, f/u and treatment, and I will see her in 01/2020 for SCP visit.

## 2019-11-10 ENCOUNTER — Inpatient Hospital Stay: Payer: Medicare Other

## 2019-11-10 ENCOUNTER — Encounter: Payer: Self-pay | Admitting: Adult Health

## 2019-11-10 ENCOUNTER — Inpatient Hospital Stay: Payer: Medicare Other | Attending: Hematology and Oncology

## 2019-11-10 ENCOUNTER — Other Ambulatory Visit: Payer: Self-pay

## 2019-11-10 ENCOUNTER — Inpatient Hospital Stay (HOSPITAL_BASED_OUTPATIENT_CLINIC_OR_DEPARTMENT_OTHER): Payer: Medicare Other | Admitting: Adult Health

## 2019-11-10 VITALS — BP 141/61

## 2019-11-10 VITALS — BP 153/58 | HR 84 | Temp 98.3°F | Resp 18 | Ht 62.0 in | Wt 154.4 lb

## 2019-11-10 DIAGNOSIS — Z8249 Family history of ischemic heart disease and other diseases of the circulatory system: Secondary | ICD-10-CM | POA: Diagnosis not present

## 2019-11-10 DIAGNOSIS — Z79811 Long term (current) use of aromatase inhibitors: Secondary | ICD-10-CM | POA: Diagnosis not present

## 2019-11-10 DIAGNOSIS — C50412 Malignant neoplasm of upper-outer quadrant of left female breast: Secondary | ICD-10-CM | POA: Insufficient documentation

## 2019-11-10 DIAGNOSIS — G620 Drug-induced polyneuropathy: Secondary | ICD-10-CM | POA: Insufficient documentation

## 2019-11-10 DIAGNOSIS — D6481 Anemia due to antineoplastic chemotherapy: Secondary | ICD-10-CM | POA: Insufficient documentation

## 2019-11-10 DIAGNOSIS — Z8261 Family history of arthritis: Secondary | ICD-10-CM | POA: Insufficient documentation

## 2019-11-10 DIAGNOSIS — Z833 Family history of diabetes mellitus: Secondary | ICD-10-CM | POA: Insufficient documentation

## 2019-11-10 DIAGNOSIS — E119 Type 2 diabetes mellitus without complications: Secondary | ICD-10-CM | POA: Diagnosis not present

## 2019-11-10 DIAGNOSIS — Z5112 Encounter for antineoplastic immunotherapy: Secondary | ICD-10-CM | POA: Diagnosis not present

## 2019-11-10 DIAGNOSIS — M1990 Unspecified osteoarthritis, unspecified site: Secondary | ICD-10-CM | POA: Diagnosis not present

## 2019-11-10 DIAGNOSIS — Z803 Family history of malignant neoplasm of breast: Secondary | ICD-10-CM | POA: Diagnosis not present

## 2019-11-10 DIAGNOSIS — Z170 Estrogen receptor positive status [ER+]: Secondary | ICD-10-CM | POA: Diagnosis not present

## 2019-11-10 DIAGNOSIS — Z923 Personal history of irradiation: Secondary | ICD-10-CM | POA: Insufficient documentation

## 2019-11-10 DIAGNOSIS — M25521 Pain in right elbow: Secondary | ICD-10-CM | POA: Insufficient documentation

## 2019-11-10 DIAGNOSIS — Z8349 Family history of other endocrine, nutritional and metabolic diseases: Secondary | ICD-10-CM | POA: Diagnosis not present

## 2019-11-10 DIAGNOSIS — Z95828 Presence of other vascular implants and grafts: Secondary | ICD-10-CM

## 2019-11-10 DIAGNOSIS — Z8262 Family history of osteoporosis: Secondary | ICD-10-CM | POA: Diagnosis not present

## 2019-11-10 LAB — CMP (CANCER CENTER ONLY)
ALT: 42 U/L (ref 0–44)
AST: 34 U/L (ref 15–41)
Albumin: 4.1 g/dL (ref 3.5–5.0)
Alkaline Phosphatase: 137 U/L — ABNORMAL HIGH (ref 38–126)
Anion gap: 10 (ref 5–15)
BUN: 19 mg/dL (ref 8–23)
CO2: 21 mmol/L — ABNORMAL LOW (ref 22–32)
Calcium: 9.4 mg/dL (ref 8.9–10.3)
Chloride: 107 mmol/L (ref 98–111)
Creatinine: 0.91 mg/dL (ref 0.44–1.00)
GFR, Est AFR Am: >60 mL/min (ref >60)
GFR, Estimated: >60 mL/min (ref >60)
Glucose, Bld: 149 mg/dL — ABNORMAL HIGH (ref 70–99)
Potassium: 4 mmol/L (ref 3.5–5.1)
Sodium: 138 mmol/L (ref 135–145)
Total Bilirubin: 0.6 mg/dL (ref 0.3–1.2)
Total Protein: 7.4 g/dL (ref 6.5–8.1)

## 2019-11-10 LAB — CBC WITH DIFFERENTIAL (CANCER CENTER ONLY)
Abs Immature Granulocytes: 0.01 10*3/uL (ref 0.00–0.07)
Basophils Absolute: 0 10*3/uL (ref 0.0–0.1)
Basophils Relative: 1 %
Eosinophils Absolute: 0.2 10*3/uL (ref 0.0–0.5)
Eosinophils Relative: 5 %
HCT: 34.1 % — ABNORMAL LOW (ref 36.0–46.0)
Hemoglobin: 11.5 g/dL — ABNORMAL LOW (ref 12.0–15.0)
Immature Granulocytes: 0 %
Lymphocytes Relative: 19 %
Lymphs Abs: 0.7 10*3/uL (ref 0.7–4.0)
MCH: 35.5 pg — ABNORMAL HIGH (ref 26.0–34.0)
MCHC: 33.7 g/dL (ref 30.0–36.0)
MCV: 105.2 fL — ABNORMAL HIGH (ref 80.0–100.0)
Monocytes Absolute: 0.5 10*3/uL (ref 0.1–1.0)
Monocytes Relative: 13 %
Neutro Abs: 2.4 10*3/uL (ref 1.7–7.7)
Neutrophils Relative %: 62 %
Platelet Count: 120 10*3/uL — ABNORMAL LOW (ref 150–400)
RBC: 3.24 MIL/uL — ABNORMAL LOW (ref 3.87–5.11)
RDW: 14.5 % (ref 11.5–15.5)
WBC Count: 3.9 10*3/uL — ABNORMAL LOW (ref 4.0–10.5)
nRBC: 0 % (ref 0.0–0.2)

## 2019-11-10 MED ORDER — DIPHENHYDRAMINE HCL 25 MG PO CAPS
50.0000 mg | ORAL_CAPSULE | Freq: Once | ORAL | Status: AC
  Administered 2019-11-10: 50 mg via ORAL

## 2019-11-10 MED ORDER — SODIUM CHLORIDE 0.9% FLUSH
10.0000 mL | Freq: Once | INTRAVENOUS | Status: AC
  Administered 2019-11-10: 10 mL
  Filled 2019-11-10: qty 10

## 2019-11-10 MED ORDER — SODIUM CHLORIDE 0.9% FLUSH
10.0000 mL | INTRAVENOUS | Status: DC | PRN
  Administered 2019-11-10: 10 mL
  Filled 2019-11-10: qty 10

## 2019-11-10 MED ORDER — DIPHENHYDRAMINE HCL 25 MG PO CAPS
ORAL_CAPSULE | ORAL | Status: AC
  Filled 2019-11-10: qty 2

## 2019-11-10 MED ORDER — TRASTUZUMAB-DKST CHEMO 150 MG IV SOLR
6.0000 mg/kg | Freq: Once | INTRAVENOUS | Status: AC
  Administered 2019-11-10: 399 mg via INTRAVENOUS
  Filled 2019-11-10: qty 19

## 2019-11-10 MED ORDER — ACETAMINOPHEN 325 MG PO TABS
ORAL_TABLET | ORAL | Status: AC
  Filled 2019-11-10: qty 2

## 2019-11-10 MED ORDER — ACETAMINOPHEN 325 MG PO TABS
650.0000 mg | ORAL_TABLET | Freq: Once | ORAL | Status: AC
  Administered 2019-11-10: 650 mg via ORAL

## 2019-11-10 MED ORDER — HEPARIN SOD (PORK) LOCK FLUSH 100 UNIT/ML IV SOLN
500.0000 [IU] | Freq: Once | INTRAVENOUS | Status: AC | PRN
  Administered 2019-11-10: 500 [IU]
  Filled 2019-11-10: qty 5

## 2019-11-10 MED ORDER — SODIUM CHLORIDE 0.9 % IV SOLN
Freq: Once | INTRAVENOUS | Status: AC
  Filled 2019-11-10: qty 250

## 2019-11-10 NOTE — Patient Instructions (Signed)
Cancer Center °Discharge Instructions for Patients Receiving Chemotherapy ° °Today you received the following chemotherapy agents Trastuzumab ° °To help prevent nausea and vomiting after your treatment, we encourage you to take your nausea medication as directed. °  °If you develop nausea and vomiting that is not controlled by your nausea medication, call the clinic.  ° °BELOW ARE SYMPTOMS THAT SHOULD BE REPORTED IMMEDIATELY: °· *FEVER GREATER THAN 100.5 F °· *CHILLS WITH OR WITHOUT FEVER °· NAUSEA AND VOMITING THAT IS NOT CONTROLLED WITH YOUR NAUSEA MEDICATION °· *UNUSUAL SHORTNESS OF BREATH °· *UNUSUAL BRUISING OR BLEEDING °· TENDERNESS IN MOUTH AND THROAT WITH OR WITHOUT PRESENCE OF ULCERS °· *URINARY PROBLEMS °· *BOWEL PROBLEMS °· UNUSUAL RASH °Items with * indicate a potential emergency and should be followed up as soon as possible. ° °Feel free to call the clinic should you have any questions or concerns. The clinic phone number is [phone]. ° °Please show the CHEMO ALERT CARD at check-in to the Emergency Department and triage nurse. ° ° °

## 2019-11-10 NOTE — Patient Instructions (Signed)

## 2019-11-11 ENCOUNTER — Telehealth: Payer: Self-pay | Admitting: Adult Health

## 2019-11-11 NOTE — Telephone Encounter (Signed)
Scheduled appts per 3/17 los. Pt confirmed new appt date and time.

## 2019-11-15 DIAGNOSIS — Z20822 Contact with and (suspected) exposure to covid-19: Secondary | ICD-10-CM | POA: Diagnosis not present

## 2019-11-15 DIAGNOSIS — I671 Cerebral aneurysm, nonruptured: Secondary | ICD-10-CM | POA: Diagnosis not present

## 2019-11-17 DIAGNOSIS — I671 Cerebral aneurysm, nonruptured: Secondary | ICD-10-CM | POA: Diagnosis not present

## 2019-11-17 DIAGNOSIS — I10 Essential (primary) hypertension: Secondary | ICD-10-CM | POA: Diagnosis not present

## 2019-11-17 DIAGNOSIS — Z95828 Presence of other vascular implants and grafts: Secondary | ICD-10-CM | POA: Diagnosis not present

## 2019-11-17 DIAGNOSIS — Z853 Personal history of malignant neoplasm of breast: Secondary | ICD-10-CM | POA: Diagnosis not present

## 2019-11-17 DIAGNOSIS — E119 Type 2 diabetes mellitus without complications: Secondary | ICD-10-CM | POA: Diagnosis not present

## 2019-11-17 DIAGNOSIS — Z7982 Long term (current) use of aspirin: Secondary | ICD-10-CM | POA: Diagnosis not present

## 2019-11-17 DIAGNOSIS — E785 Hyperlipidemia, unspecified: Secondary | ICD-10-CM | POA: Diagnosis not present

## 2019-11-17 NOTE — Telephone Encounter (Signed)
Form printed and placed in Dr. Rometta Emery in box to complete.

## 2019-11-22 NOTE — Telephone Encounter (Signed)
I faxed form to [facility] and notified patient.

## 2019-11-25 NOTE — Progress Notes (Signed)
Pharmacist Chemotherapy Monitoring - Follow Up Assessment    I verify that I have reviewed each item in the below checklist:  . Regimen for the patient is scheduled for the appropriate day and plan matches scheduled date. [name] Appropriate non-routine labs are ordered dependent on drug ordered. . If applicable, additional medications reviewed and ordered per protocol based on lifetime cumulative doses and/or treatment regimen.   Plan for follow-up and/or issues identified: No . I-vent associated with next due treatment: No . MD and/or nursing notified: No  Delane Ginger 11/25/2019 8:07 AM

## 2019-11-29 DIAGNOSIS — I724 Aneurysm of artery of lower extremity: Secondary | ICD-10-CM | POA: Diagnosis not present

## 2019-11-29 DIAGNOSIS — E119 Type 2 diabetes mellitus without complications: Secondary | ICD-10-CM | POA: Diagnosis not present

## 2019-11-29 DIAGNOSIS — M7752 Other enthesopathy of left foot: Secondary | ICD-10-CM | POA: Diagnosis not present

## 2019-11-29 DIAGNOSIS — M7751 Other enthesopathy of right foot: Secondary | ICD-10-CM | POA: Diagnosis not present

## 2019-11-30 NOTE — Progress Notes (Signed)
Patient Care Team: [name], MD as PCP - General (Family Medicine) Birder [name], MD as Referring Physician (Ophthalmology) [name], MD as Consulting Physician (Dermatology) [name], MD (Inactive) as Consulting Physician (Orthopedic Surgery) [name], MD as Consulting Physician (General Surgery) [name], MD as Consulting Physician (Dentistry) [name], MD (Inactive) as Consulting Physician (General Surgery) [name], MD as Consulting Physician (Hematology and Oncology) [name], MD as Attending Physician (Radiation Oncology)  DIAGNOSIS:    ICD-10-CM   1. Malignant neoplasm of upper-outer quadrant of left breast in female, estrogen receptor positive ([place])  C50.412 CT Chest W Contrast   Z17.0 CT Abdomen Pelvis W Contrast    CBC with Differential ([place] Only)    CMP ([place] only)    SUMMARY OF ONCOLOGIC HISTORY: Oncology History  Malignant neoplasm of upper-outer quadrant of left breast in female, estrogen receptor positive ([place])  09/14/2018 Initial Diagnosis   With prior history of breast implants that were ruptured/replaced, patient had cellulitis LIQ left breast for 1 week, and UOQ asymmetry 1.5 cm, MRI right breast biopsy benign, left breast numerous masses 7 cm skin thickening: 2 biopsies from left breast: Grade 2-3 IDC ER PR positive, HER-2 positive, Ki-67 50%   09/16/2018 Cancer Staging   Staging form: Breast, AJCC 8th Edition - Clinical stage from 09/16/2018: Stage IIIB (cT4, cN0, cM0, G3, ER+, PR+, HER2+) - Signed by [name], MD on 09/16/2018   09/29/2018 -  Neo-Adjuvant Chemotherapy   TCHP q 3 weeks   01/19/2019 Breast MRI   Significant interval improvement, the multiple left breast masses and areas of non mass enhancement in all 4 quadrants have decreased in size and extent. The mass previously measuring 1.4 cm with diffuse enhancement now measures 9 mm with heterogeneous areas of enhancement.  Current greatest transverse dimension is 5.4 x 3.9 cm, previously 7 cm x 5cm. 2. 4 mm masslike focus of enhancement at the base of the right nipple is unchanged.   02/10/2019 - 03/23/2019 Chemotherapy   The patient had trastuzumab (HERCEPTIN) 450 mg in sodium chloride 0.9 % 250 mL chemo infusion, 420 mg (100 % of original dose 6 mg/kg), Intravenous,  Once, 2 of 5 cycles Dose modification: 6 mg/kg (original dose 6 mg/kg, Cycle 1, Reason: Provider Judgment) Administration: 450 mg (02/10/2019), 450 mg (03/03/2019)  for chemotherapy treatment.    03/08/2019 Surgery   Bilateral mastectomies [name]: right breast: no malignancy; left breast: IDC, grade 2, scattered over 7.5cm, clear margins.    03/08/2019 Cancer Staging   Staging form: Breast, AJCC 8th Edition - Pathologic stage from 03/08/2019: No Stage Recommended (ypT3, pNX, cM0, G2, ER+, PR+, HER2-) - Signed by [name], MD on 04/21/2019   03/24/2019 - 07/27/2019 Chemotherapy   The patient had ado-trastuzumab emtansine (KADCYLA) 260 mg in sodium chloride 0.9 % 250 mL chemo infusion, 3.6 mg/kg = 260 mg, Intravenous, Once, 6 of 10 cycles Dose modification: 3 mg/kg (original dose 3.6 mg/kg, Cycle 5, Reason: Dose not tolerated), 2.4 mg/kg (original dose 3.6 mg/kg, Cycle 6, Reason: Provider Judgment) Administration: 260 mg (03/24/2019), 260 mg (04/14/2019), 200 mg (06/16/2019), 260 mg (05/05/2019), 200 mg (05/26/2019), 160 mg (07/07/2019)  for chemotherapy treatment.    05/04/2019 - 06/14/2019 Radiation Therapy   Adjuvant radiation therapy   06/2019 -  Anti-estrogen oral therapy   Anastrozole daily   09/29/2019 -  Chemotherapy   The patient had trastuzumab-dkst (OGIVRI) 546 mg in sodium chloride 0.9 % 250 mL chemo infusion, 8  mg/kg = 546 mg, Intravenous,  Once, 4 of 4 cycles Administration: 546 mg (09/29/2019), 399 mg (10/20/2019), 399 mg (11/10/2019), 399 mg (12/01/2019)  for chemotherapy treatment.      CHIEF COMPLIANT: Herceptin maintenance  INTERVAL  HISTORY: Katie [name] is a 74 y.o. with above-mentioned history of HER-2 positiveleft breast cancertreated withneoadjuvant chemotherapy,bilateral mastectomies, radiation, and whois currently on adjuvant treatment with Herceptin maintenance and antiestrogen therapy with anastrozole. She presents to the clinic today for treatment.  Today's is her last dose of Herceptin.  Overall she has tolerating the treatment very well.  ALLERGIES:  is allergic to erythromycin; ciprofloxacin; daypro [oxaprozin]; enalapril maleate; nsaids; and vioxx [rofecoxib].  MEDICATIONS:  Current Outpatient Medications  Medication Sig Dispense Refill  . amLODipine (NORVASC) 10 MG tablet Take 1 tablet (10 mg total) by mouth daily. 90 tablet 3  . anastrozole (ARIMIDEX) 1 MG tablet Take 1 tablet (1 mg total) by mouth daily. 90 tablet 3  . aspirin 81 MG EC tablet Take 81 mg by mouth daily. Swallow whole.    [name] atorvastatin (LIPITOR) 10 MG tablet TAKE 1 TABLET(10 MG) BY MOUTH DAILY 90 tablet 3  . BRILINTA 90 MG TABS tablet Take 90 mg by mouth 2 (two) times daily.    . Calcium Carbonate-Vit D-Min (CALTRATE 600+D PLUS MINERALS) 600-800 MG-UNIT TABS Take 1 tablet by mouth in the morning and at bedtime.    . clobetasol (TEMOVATE) 0.05 % external solution Apply 1 application topically 2 (two) times daily as needed (scalp). Reported on 08/16/2015    . dexamethasone (DECADRON) 0.1 % ophthalmic suspension Place 1 drop into both eyes 2 (two) times daily. 15 mL 1  . fluticasone (FLONASE) 50 MCG/ACT nasal spray SHAKE LIQUID AND USE 2 SPRAYS IN EACH NOSTRIL EVERY DAY 16 g 5  . gabapentin (NEURONTIN) 300 MG capsule Take 1 capsule (300 mg total) by mouth at bedtime. 90 capsule 3  . glucose blood test strip USE TO CHECK BLOOD SUGAR DAILY 50 each 12  . loratadine (CLARITIN) 10 MG tablet Take 10 mg by mouth daily.    . metFORMIN (GLUCOPHAGE-XR) 500 MG 24 hr tablet Take 1 tablet (500 mg total) by mouth at bedtime. 90 tablet 1  . Multiple  Vitamins-Minerals (CENTRUM SILVER 50+WOMEN) TABS Take 1 tablet by mouth daily.     No current facility-administered medications for this visit.   Facility-Administered Medications Ordered in Other Visits  Medication Dose Route Frequency Provider Last Rate Last Admin  . heparin lock flush 100 unit/mL  500 Units Intracatheter Once PRN [name], MD      . sodium chloride flush (NS) 0.9 % injection 10 mL  10 mL Intracatheter PRN [name], MD      . trastuzumab-dkst (OGIVRI) 399 mg in sodium chloride 0.9 % 250 mL chemo infusion  6 mg/kg (Treatment Plan Recorded) Intravenous Once [name], MD 538 mL/hr at 12/01/19 1007 399 mg at 12/01/19 1007    PHYSICAL EXAMINATION: ECOG PERFORMANCE STATUS: 1 - Symptomatic but completely ambulatory  Vitals:   12/01/19 0824  BP: 135/61  Pulse: 67  Temp: 98.3 F (36.8 C)  SpO2: 99%   Filed Weights   12/01/19 0824  Weight: 155 lb 8 oz (70.5 kg)    LABORATORY DATA:  I have reviewed the data as listed CMP Latest Ref Rng & Units 11/10/2019 10/22/2019 09/29/2019  Glucose 70 - 99 mg/dL 149(H) 151(H) 123(H)  BUN 8 - 23 mg/dL 19 23 19   Creatinine 0.44 - 1.00 mg/dL  0.91 0.90 0.85  Sodium 135 - 145 mmol/L 138 138 140  Potassium 3.5 - 5.1 mmol/L 4.0 3.9 4.0  Chloride 98 - 111 mmol/L 107 104 105  CO2 22 - 32 mmol/L 21(L) 27 24  Calcium 8.9 - 10.3 mg/dL 9.4 10.5 10.0  Total Protein 6.5 - 8.1 g/dL 7.4 7.3 7.6  Total Bilirubin 0.3 - 1.2 mg/dL 0.6 0.5 0.5  Alkaline Phos 38 - 126 U/L 137(H) 120(H) 134(H)  AST 15 - 41 U/L 34 34 28  ALT 0 - 44 U/L 42 43(H) 30    Lab Results  Component Value Date   WBC 3.6 (L) 12/01/2019   HGB 11.5 (L) 12/01/2019   HCT 34.1 (L) 12/01/2019   MCV 104.3 (H) 12/01/2019   PLT 117 (L) 12/01/2019   NEUTROABS 2.3 12/01/2019    ASSESSMENT & PLAN:  Malignant neoplasm of upper-outer quadrant of left breast in female, estrogen receptor positive ([place]) With prior history of breast implants that were ruptured/replaced,  patient had cellulitis LIQ left breast for 1 week, and UOQ asymmetry 1.5 cm, MRI right breast biopsy benign, left breast numerous masses 7 cm skin thickening: 2 biopsies from left breast: Grade 2-3 IDC ER PR positive, HER-2 positive, Ki-67 50% Clinical stage: T4 N0 M0 stage IIIb  Treatment plan 1. Neoadjuvant chemotherapy with [place] Perjeta 6 cyclescompleted 5/6/2020followed by Herceptin/ Perjeta vs Kadcylamaintenance for 1 year 2. Followed byBilmastectomieswith sentinel lymph node study 03/08/2019 3. Followed by adjuvant radiation therapystarted 05/04/2019-06/14/2019 4.Followed by adjuvant antiestrogen therapystarted 06/16/2019 ------------------------------------------------------------------------------------------------------------------------------------ 03/08/2019: Bilateral mastectomies: Left mastectomy: Grade 2 IDC spanning 7.5 cm, margins negative, right mastectomy: Benign, ER 95%, PR 5%, HER-2 1+ negative pathology lost the lymph node excision.   Treatment plan: Herceptin maintenance every 3 weekswith anastrozole 1 mg daily (treatment break given because of brain aneurysm surgery at [facility]),  started 09/29/2019 completed 12/01/2019  She is tolerating her treatment well.  She has no toxicities.   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  If the scans are normal then she can have the port removed. I will do a virtual visit the day after the scans are performed to go over the results.      Orders Placed This Encounter  Procedures  . CT Chest W Contrast    Standing Status:   Future    Standing Expiration Date:   11/30/2020    Order Specific Question:   ** REASON FOR EXAM (FREE TEXT)    Answer:   chest wall pain    Order Specific Question:   If indicated for the ordered procedure, I authorize the administration of  contrast media per Radiology protocol    Answer:   Yes    Order Specific Question:   Preferred imaging location?    Answer:   [facility]    Order Specific Question:   Radiology Contrast Protocol - do NOT remove file path    Answer:   \\[email or web address]  . CT Abdomen Pelvis W Contrast    Standing Status:   Future    Standing Expiration Date:   11/30/2020    Order Specific Question:   If indicated for the ordered procedure, I authorize the administration of contrast media per Radiology protocol    Answer:   Yes    Order Specific Question:   Preferred imaging location?    Answer:   [place]  [facility]    Order Specific Question:   Is Oral Contrast requested for this exam?    Answer:   Yes, Per Radiology protocol    Order Specific Question:   Call Results- Best Contact Number?    Answer:   Back pain and abd pain    Order Specific Question:   Radiology Contrast Protocol - do NOT remove file path    Answer:   \\[email or web address]  . CBC with Differential ([place] Only)    Standing Status:   Future    Standing Expiration Date:   11/30/2020  . CMP ([place] only)    Standing Status:   Future    Standing Expiration Date:   11/30/2020   The patient has a good understanding of the overall plan. she agrees with it. she will call with any problems that may develop before the next visit here.  Total time spent: 30 mins including face to face time and time spent for planning, charting and coordination of care  [name], MD 12/01/2019  I, Cloyde Reams Dorshimer, am acting as scribe for Dr. [name].  I have reviewed the above documentation for accuracy and completeness, and I agree with the above.

## 2019-12-01 ENCOUNTER — Inpatient Hospital Stay (HOSPITAL_BASED_OUTPATIENT_CLINIC_OR_DEPARTMENT_OTHER): Payer: Medicare Other | Admitting: Hematology and Oncology

## 2019-12-01 ENCOUNTER — Encounter: Payer: Self-pay | Admitting: *Deleted

## 2019-12-01 ENCOUNTER — Inpatient Hospital Stay: Payer: Medicare Other

## 2019-12-01 ENCOUNTER — Other Ambulatory Visit: Payer: Self-pay

## 2019-12-01 ENCOUNTER — Inpatient Hospital Stay: Payer: Medicare Other | Attending: Hematology and Oncology

## 2019-12-01 DIAGNOSIS — C50412 Malignant neoplasm of upper-outer quadrant of left female breast: Secondary | ICD-10-CM

## 2019-12-01 DIAGNOSIS — Z5112 Encounter for antineoplastic immunotherapy: Secondary | ICD-10-CM | POA: Diagnosis not present

## 2019-12-01 DIAGNOSIS — Z9013 Acquired absence of bilateral breasts and nipples: Secondary | ICD-10-CM | POA: Insufficient documentation

## 2019-12-01 DIAGNOSIS — Z79899 Other long term (current) drug therapy: Secondary | ICD-10-CM | POA: Diagnosis not present

## 2019-12-01 DIAGNOSIS — Z7952 Long term (current) use of systemic steroids: Secondary | ICD-10-CM | POA: Insufficient documentation

## 2019-12-01 DIAGNOSIS — Z923 Personal history of irradiation: Secondary | ICD-10-CM | POA: Insufficient documentation

## 2019-12-01 DIAGNOSIS — Z170 Estrogen receptor positive status [ER+]: Secondary | ICD-10-CM

## 2019-12-01 DIAGNOSIS — Z79811 Long term (current) use of aromatase inhibitors: Secondary | ICD-10-CM | POA: Insufficient documentation

## 2019-12-01 DIAGNOSIS — Z95828 Presence of other vascular implants and grafts: Secondary | ICD-10-CM

## 2019-12-01 LAB — CBC WITH DIFFERENTIAL (CANCER CENTER ONLY)
Abs Immature Granulocytes: 0.01 10*3/uL (ref 0.00–0.07)
Basophils Absolute: 0 10*3/uL (ref 0.0–0.1)
Basophils Relative: 1 %
Eosinophils Absolute: 0.2 10*3/uL (ref 0.0–0.5)
Eosinophils Relative: 5 %
HCT: 34.1 % — ABNORMAL LOW (ref 36.0–46.0)
Hemoglobin: 11.5 g/dL — ABNORMAL LOW (ref 12.0–15.0)
Immature Granulocytes: 0 %
Lymphocytes Relative: 20 %
Lymphs Abs: 0.7 10*3/uL (ref 0.7–4.0)
MCH: 35.2 pg — ABNORMAL HIGH (ref 26.0–34.0)
MCHC: 33.7 g/dL (ref 30.0–36.0)
MCV: 104.3 fL — ABNORMAL HIGH (ref 80.0–100.0)
Monocytes Absolute: 0.4 10*3/uL (ref 0.1–1.0)
Monocytes Relative: 11 %
Neutro Abs: 2.3 10*3/uL (ref 1.7–7.7)
Neutrophils Relative %: 63 %
Platelet Count: 117 10*3/uL — ABNORMAL LOW (ref 150–400)
RBC: 3.27 MIL/uL — ABNORMAL LOW (ref 3.87–5.11)
RDW: 13.8 % (ref 11.5–15.5)
WBC Count: 3.6 10*3/uL — ABNORMAL LOW (ref 4.0–10.5)
nRBC: 0 % (ref 0.0–0.2)

## 2019-12-01 MED ORDER — DIPHENHYDRAMINE HCL 25 MG PO CAPS
50.0000 mg | ORAL_CAPSULE | Freq: Once | ORAL | Status: AC
  Administered 2019-12-01: 09:00:00 50 mg via ORAL

## 2019-12-01 MED ORDER — HEPARIN SOD (PORK) LOCK FLUSH 100 UNIT/ML IV SOLN
500.0000 [IU] | Freq: Once | INTRAVENOUS | Status: AC | PRN
  Administered 2019-12-01: 11:00:00 500 [IU]
  Filled 2019-12-01: qty 5

## 2019-12-01 MED ORDER — TRASTUZUMAB-DKST CHEMO 150 MG IV SOLR
6.0000 mg/kg | Freq: Once | INTRAVENOUS | Status: AC
  Administered 2019-12-01: 399 mg via INTRAVENOUS
  Filled 2019-12-01: qty 19

## 2019-12-01 MED ORDER — DIPHENHYDRAMINE HCL 25 MG PO CAPS
ORAL_CAPSULE | ORAL | Status: AC
  Filled 2019-12-01: qty 2

## 2019-12-01 MED ORDER — SODIUM CHLORIDE 0.9% FLUSH
10.0000 mL | Freq: Once | INTRAVENOUS | Status: AC
  Administered 2019-12-01: 10 mL
  Filled 2019-12-01: qty 10

## 2019-12-01 MED ORDER — ACETAMINOPHEN 325 MG PO TABS
ORAL_TABLET | ORAL | Status: AC
  Filled 2019-12-01: qty 2

## 2019-12-01 MED ORDER — SODIUM CHLORIDE 0.9 % IV SOLN
Freq: Once | INTRAVENOUS | Status: AC
  Filled 2019-12-01: qty 250

## 2019-12-01 MED ORDER — ACETAMINOPHEN 325 MG PO TABS
650.0000 mg | ORAL_TABLET | Freq: Once | ORAL | Status: AC
  Administered 2019-12-01: 09:00:00 650 mg via ORAL

## 2019-12-01 MED ORDER — SODIUM CHLORIDE 0.9% FLUSH
10.0000 mL | INTRAVENOUS | Status: DC | PRN
  Administered 2019-12-01: 11:00:00 10 mL
  Filled 2019-12-01: qty 10

## 2019-12-01 NOTE — Patient Instructions (Signed)

## 2019-12-01 NOTE — Assessment & Plan Note (Signed)
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 Neoadjuvant chemotherapy with [place] Perjeta 6 cyclescompleted 5/6/2020followed by Herceptin/ Perjeta vs Kadcylamaintenance for 1 year 2. Followed byBilmastectomieswith sentinel lymph node study 03/08/2019 3. Followed by adjuvant radiation therapystarted 05/04/2019-06/14/2019 4.Followed by adjuvant antiestrogen therapystarted 06/16/2019 ------------------------------------------------------------------------------------------------------------------------------------ 03/08/2019: Bilateral mastectomies: Left mastectomy: Grade 2 IDC spanning 7.5 cm, margins negative, right mastectomy: Benign, ER 95%, PR 5%, HER-2 1+ negative pathology lost the lymph node excision.   Treatment plan: Herceptin maintenance every 3 weekswith anastrozole 1 mg daily (treatment break given because of brain aneurysm surgery at [facility]),  started 09/29/2019  She is tolerating her treatment well.  She has no toxicities.   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

## 2019-12-01 NOTE — Patient Instructions (Signed)
[facility] °Discharge Instructions for Patients Receiving Chemotherapy ° °Today you received the following chemotherapy agents Trastuzumab ° °To help prevent nausea and vomiting after your treatment, we encourage you to take your nausea medication as directed. °  °If you develop nausea and vomiting that is not controlled by your nausea medication, call the clinic.  ° °BELOW ARE SYMPTOMS THAT SHOULD BE REPORTED IMMEDIATELY: °· *FEVER GREATER THAN 100.5 F °· *CHILLS WITH OR WITHOUT FEVER °· NAUSEA AND VOMITING THAT IS NOT CONTROLLED WITH YOUR NAUSEA MEDICATION °· *UNUSUAL SHORTNESS OF BREATH °· *UNUSUAL BRUISING OR BLEEDING °· TENDERNESS IN MOUTH AND THROAT WITH OR WITHOUT PRESENCE OF ULCERS °· *URINARY PROBLEMS °· *BOWEL PROBLEMS °· UNUSUAL RASH °Items with * indicate a potential emergency and should be followed up as soon as possible. ° °Feel free to call the clinic should you have any questions or concerns. The clinic phone number is [phone]. ° °Please show the CHEMO ALERT CARD at check-in to the Emergency Department and triage nurse. ° ° °

## 2019-12-06 ENCOUNTER — Telehealth: Payer: Self-pay | Admitting: Family Medicine

## 2019-12-06 NOTE — Telephone Encounter (Signed)
Patient called today  She stated she was recently prescribed amLODipine for high blood pressure  Patient has noticed that her legs are starting to swell and believes she may be having side effect from the medication.

## 2019-12-06 NOTE — Telephone Encounter (Signed)
Spoke to patient and was advised that she was taken off of Losartan back in February and put on Amlodipine. Patient stated that she noticed about a week or so ago that her feet have been swelling especially at night. Patient stated that she is wondering if she is having a side effect from the Amlodipine. Patient stated that she does not routinely elevate her feet when sitting around. Advised patient that she should elevate her feet when she is sitting around. Patient stated that the swelling does go down overnight. Patient denies any SOB or difficulty breathing. Patient stated that she is not having any other symptoms other than the swelling in her feet at times. Patient stated that she is also on a cancer medication and not sure if that may be causing the swelling.  Patient stated that she would like to come in and discuss this with Dr. Diona Browner. Appointment scheduled with Dr. Diona Browner Tuesday 12/07/19 at 9:20.  Patient was given ER precautions and she verbalized understanding.

## 2019-12-07 ENCOUNTER — Encounter: Payer: Self-pay | Admitting: Family Medicine

## 2019-12-07 ENCOUNTER — Other Ambulatory Visit: Payer: Self-pay

## 2019-12-07 ENCOUNTER — Ambulatory Visit (INDEPENDENT_AMBULATORY_CARE_PROVIDER_SITE_OTHER): Payer: Medicare Other | Admitting: Family Medicine

## 2019-12-07 VITALS — BP 138/58 | HR 80 | Temp 97.9°F | Ht 62.0 in | Wt 154.0 lb

## 2019-12-07 DIAGNOSIS — I10 Essential (primary) hypertension: Secondary | ICD-10-CM | POA: Diagnosis not present

## 2019-12-07 DIAGNOSIS — R609 Edema, unspecified: Secondary | ICD-10-CM | POA: Diagnosis not present

## 2019-12-07 DIAGNOSIS — R600 Localized edema: Secondary | ICD-10-CM

## 2019-12-07 MED ORDER — LOSARTAN POTASSIUM-HCTZ 100-25 MG PO TABS: 1.0000 | ORAL_TABLET | Freq: Every day | ORAL | 3 refills | Status: DC

## 2019-12-07 NOTE — Progress Notes (Signed)
Chief Complaint  Patient presents with  . Leg Swelling    History of Present Illness: HPI   74 year old female with history of breast cancer, HTN, diabetes, neuropathy from chemo presents for new onset bilateral foot swelling.   She has noted swelling in her ankle in both legs, more consistent x 1 week.   She has noted off and on in the past  year.  Worse in evenings. No SOB, CP, no leg pain ( tingling and pain in feet at times felt to be neuropathy, gabapentin has helped. She has been on amlodipine in the last year. (increase  10  mg in  04/2019)  ( taken off losartan when on chemo.[name] given she got dehydrated)  Breast cancer.. done chemo.  Completed immunotherapy. Has CT Chest Abd pelvis. For re-eval tommorow.  ECHO: 08/2019: nml EF. No diastolic dysfunction.  Has cerebral aneurysm with stent: recent angiogram 11/07/2019.. showed it was still present.   BP Readings from Last 3 Encounters:  12/07/19 (!) 138/58  12/01/19 135/61  11/10/19 (!) 141/61    Wt Readings from Last 3 Encounters:  12/07/19 154 lb (69.9 kg)  12/01/19 155 lb 8 oz (70.5 kg)  11/10/19 154 lb 6.4 oz (70 kg)    This visit occurred during the SARS-CoV-2 public health emergency.  Safety protocols were in place, including screening questions prior to the visit, additional usage of staff PPE, and extensive cleaning of exam room while observing appropriate contact time as indicated for disinfecting solutions.   COVID 19 screen:  No recent travel or known exposure to COVID19 The patient denies respiratory symptoms of COVID 19 at this time. The importance of social distancing was discussed today.     Review of Systems  Constitutional: Negative for chills and fever.  HENT: Negative for congestion and ear pain.   Eyes: Negative for pain and redness.  Respiratory: Negative for cough and shortness of breath.   Cardiovascular: Positive for leg swelling. Negative for chest pain and palpitations.   Gastrointestinal: Negative for abdominal pain, blood in stool, constipation, diarrhea, nausea and vomiting.  Genitourinary: Negative for dysuria.  Musculoskeletal: Negative for falls and myalgias.  Skin: Negative for rash.  Neurological: Negative for dizziness.  Psychiatric/Behavioral: Negative for depression. The patient is not nervous/anxious.       Past Medical History:  Diagnosis Date  . Allergy   . Anemia   . Aneurysm ([place])    in brain, small, not treating-watching   . Blood transfusion without reported diagnosis   . Cancer ([place])    breast 08/2018  . Diverticulitis   . DJD (degenerative joint disease)   . DM type 2 (diabetes mellitus, type 2) ([place])   . Family history of breast cancer   . Fibrocystic breast disease   . GERD (gastroesophageal reflux disease)   . History of chicken pox   . Hypercholesteremia   . Hypertension   . Lichen planopilaris   . Urinary incontinence     reports that she has never smoked. She has never used smokeless tobacco. She reports previous alcohol use. She reports that she does not use drugs.   Current Outpatient Medications:  .  amLODipine (NORVASC) 10 MG tablet, Take 1 tablet (10 mg total) by mouth daily., Disp: 90 tablet, Rfl: 3 .  anastrozole (ARIMIDEX) 1 MG tablet, Take 1 tablet (1 mg total) by mouth daily., Disp: 90 tablet, Rfl: 3 .  aspirin 81 MG EC tablet, Take 81 mg by mouth daily. Swallow whole.,  Disp: , Rfl:  .  atorvastatin (LIPITOR) 10 MG tablet, TAKE 1 TABLET(10 MG) BY MOUTH DAILY, Disp: 90 tablet, Rfl: 3 .  Calcium Carbonate-Vit D-Min (CALTRATE 600+D PLUS MINERALS) 600-800 MG-UNIT TABS, Take 1 tablet by mouth in the morning and at bedtime., Disp: , Rfl:  .  clobetasol (TEMOVATE) 0.05 % external solution, Apply 1 application topically 2 (two) times daily as needed (scalp). Reported on 08/16/2015, Disp: , Rfl:  .  dexamethasone (DECADRON) 0.1 % ophthalmic suspension, Place 1 drop into both eyes 2 (two) times daily., Disp: 15 mL, Rfl:  1 .  fluticasone (FLONASE) 50 MCG/ACT nasal spray, SHAKE LIQUID AND USE 2 SPRAYS IN EACH NOSTRIL EVERY DAY, Disp: 16 g, Rfl: 5 .  gabapentin (NEURONTIN) 300 MG capsule, Take 1 capsule (300 mg total) by mouth at bedtime., Disp: 90 capsule, Rfl: 3 .  glucose blood test strip, USE TO CHECK BLOOD SUGAR DAILY, Disp: 50 each, Rfl: 12 .  loratadine (CLARITIN) 10 MG tablet, Take 10 mg by mouth daily., Disp: , Rfl:  .  metFORMIN (GLUCOPHAGE-XR) 500 MG 24 hr tablet, Take 1 tablet (500 mg total) by mouth at bedtime., Disp: 90 tablet, Rfl: 1 .  Multiple Vitamins-Minerals (CENTRUM SILVER 50+WOMEN) TABS, Take 1 tablet by mouth daily., Disp: , Rfl:  .  BRILINTA 90 MG TABS tablet, Take 90 mg by mouth 2 (two) times daily., Disp: , Rfl:    Observations/Objective: Blood pressure (!) 138/58, pulse 80, temperature 97.9 F (36.6 C), temperature source Temporal, height 5\' 2"  (1.575 m), weight 154 lb (69.9 kg), last menstrual period 08/26/1992, SpO2 98 %.  Physical Exam Constitutional:      General: She is not in acute distress.    Appearance: Normal appearance. She is well-developed. She is not ill-appearing or toxic-appearing.  HENT:     Head: Normocephalic.     Right Ear: Hearing, tympanic membrane, ear canal and external ear normal. Tympanic membrane is not erythematous, retracted or bulging.     Left Ear: Hearing, tympanic membrane, ear canal and external ear normal. Tympanic membrane is not erythematous, retracted or bulging.     Nose: No mucosal edema or rhinorrhea.     Right Sinus: No maxillary sinus tenderness or frontal sinus tenderness.     Left Sinus: No maxillary sinus tenderness or frontal sinus tenderness.     Mouth/Throat:     Pharynx: Uvula midline.  Eyes:     General: Lids are normal. Lids are everted, no foreign bodies appreciated.     Conjunctiva/sclera: Conjunctivae normal.     Pupils: Pupils are equal, round, and reactive to light.  Neck:     Thyroid: No thyroid mass or thyromegaly.      Vascular: No carotid bruit.     Trachea: Trachea normal.  Cardiovascular:     Rate and Rhythm: Normal rate and regular rhythm.     Pulses: Normal pulses.     Heart sounds: Normal heart sounds, S1 normal and S2 normal. No murmur. No friction rub. No gallop.   Pulmonary:     Effort: Pulmonary effort is normal. No tachypnea or respiratory distress.     Breath sounds: Normal breath sounds. No decreased breath sounds, wheezing, rhonchi or rales.  Abdominal:     General: Bowel sounds are normal.     Palpations: Abdomen is soft.     Tenderness: There is no abdominal tenderness.  Musculoskeletal:     Cervical back: Normal range of motion and neck supple.  Right lower leg: 1+ Edema present.     Left lower leg: 1+ Edema present.  Skin:    General: Skin is warm and dry.     Findings: No rash.  Neurological:     Mental Status: She is alert.  Psychiatric:        Mood and Affect: Mood is not anxious or depressed.        Speech: Speech normal.        Behavior: Behavior normal. Behavior is cooperative.        Thought Content: Thought content normal.        Judgment: Judgment normal.      Assessment and Plan Peripheral edema Stop amlodipine. Increase activity as able. Restart losartan/HCTZ at previous dose. Follow blood pressure at home... goal < 140/90.    Essential hypertension, benign Follow closely on new regimen.       Eliezer Lofts, MD

## 2019-12-07 NOTE — Patient Instructions (Addendum)
Stop amlodipine. Increase activity as able. Restart losartan/HCTZ at previous dose. Follow blood pressure at home... goal < 140/90.

## 2019-12-07 NOTE — Telephone Encounter (Signed)
Noted  

## 2019-12-08 ENCOUNTER — Encounter (HOSPITAL_COMMUNITY): Payer: Self-pay

## 2019-12-08 ENCOUNTER — Ambulatory Visit (HOSPITAL_COMMUNITY)
Admission: RE | Admit: 2019-12-08 | Discharge: 2019-12-08 | Disposition: A | Discharge status: Home / Self Care | Payer: Medicare Other | Source: Ambulatory Visit | Attending: Hematology and Oncology | Admitting: Hematology and Oncology

## 2019-12-08 DIAGNOSIS — Z170 Estrogen receptor positive status [ER+]: Secondary | ICD-10-CM | POA: Diagnosis not present

## 2019-12-08 DIAGNOSIS — Z853 Personal history of malignant neoplasm of breast: Secondary | ICD-10-CM | POA: Diagnosis not present

## 2019-12-08 DIAGNOSIS — C50412 Malignant neoplasm of upper-outer quadrant of left female breast: Secondary | ICD-10-CM | POA: Insufficient documentation

## 2019-12-08 DIAGNOSIS — K5730 Diverticulosis of large intestine without perforation or abscess without bleeding: Secondary | ICD-10-CM | POA: Diagnosis not present

## 2019-12-08 MED ORDER — SODIUM CHLORIDE (PF) 0.9 % IJ SOLN
INTRAMUSCULAR | Status: AC
  Filled 2019-12-08: qty 50

## 2019-12-08 MED ORDER — IOHEXOL 300 MG/ML  SOLN
100.0000 mL | Freq: Once | INTRAMUSCULAR | Status: AC | PRN
  Administered 2019-12-08: 100 mL via INTRAVENOUS

## 2019-12-08 NOTE — Progress Notes (Signed)
HEMATOLOGY-ONCOLOGY MYCHART VIDEO VISIT PROGRESS NOTE  I connected with Katie [name] on 12/09/2019 at 10:00 AM EDT by MyChart video conference and verified that I am speaking with the correct person using two identifiers.  I discussed the limitations, risks, security and privacy concerns of performing an evaluation and management service by MyChart and the availability of in person appointments.  I also discussed with the patient that there may be a patient responsible charge related to this service. The patient expressed understanding and agreed to proceed.  Patient's Location: Home Physician Location: Clinic  CHIEF COMPLIANT: Follow-up of left breast cancer, review recent scan   INTERVAL HISTORY: Katie [name] is a 74 y.o. female with above-mentioned history of HER-2 positiveleft breast cancertreated withneoadjuvant chemotherapy,bilateral mastectomies, radiation, Herceptinmaintenance, and who is currently on antiestrogen therapy with anastrozole.CT CAP on 12/08/19 showed no evidence of metastatic disease. She presents over MyChart today for follow-up to review her scan.  Oncology History  Malignant neoplasm of upper-outer quadrant of left breast in female, estrogen receptor positive ([place])  09/14/2018 Initial Diagnosis   With prior history of breast implants that were ruptured/replaced, patient had cellulitis LIQ left breast for 1 week, and UOQ asymmetry 1.5 cm, MRI right breast biopsy benign, left breast numerous masses 7 cm skin thickening: 2 biopsies from left breast: Grade 2-3 IDC ER PR positive, HER-2 positive, Ki-67 50%   09/16/2018 Cancer Staging   Staging form: Breast, AJCC 8th Edition - Clinical stage from 09/16/2018: Stage IIIB (cT4, cN0, cM0, G3, ER+, PR+, HER2+) - Signed by [name], MD on 09/16/2018   09/29/2018 -  Neo-Adjuvant Chemotherapy   TCHP q 3 weeks   01/19/2019 Breast MRI   Significant interval improvement, the multiple left breast masses and areas of non  mass enhancement in all 4 quadrants have decreased in size and extent. The mass previously measuring 1.4 cm with diffuse enhancement now measures 9 mm with heterogeneous areas of enhancement. Current greatest transverse dimension is 5.4 x 3.9 cm, previously 7 cm x 5cm. 2. 4 mm masslike focus of enhancement at the base of the right nipple is unchanged.   02/10/2019 - 03/23/2019 Chemotherapy   The patient had trastuzumab (HERCEPTIN) 450 mg in sodium chloride 0.9 % 250 mL chemo infusion, 420 mg (100 % of original dose 6 mg/kg), Intravenous,  Once, 2 of 5 cycles Dose modification: 6 mg/kg (original dose 6 mg/kg, Cycle 1, Reason: Provider Judgment) Administration: 450 mg (02/10/2019), 450 mg (03/03/2019)  for chemotherapy treatment.    03/08/2019 Surgery   Bilateral mastectomies [name]: right breast: no malignancy; left breast: IDC, grade 2, scattered over 7.5cm, clear margins.    03/08/2019 Cancer Staging   Staging form: Breast, AJCC 8th Edition - Pathologic stage from 03/08/2019: No Stage Recommended (ypT3, pNX, cM0, G2, ER+, PR+, HER2-) - Signed by [name], MD on 04/21/2019   03/24/2019 - 07/27/2019 Chemotherapy   The patient had ado-trastuzumab emtansine (KADCYLA) 260 mg in sodium chloride 0.9 % 250 mL chemo infusion, 3.6 mg/kg = 260 mg, Intravenous, Once, 6 of 10 cycles Dose modification: 3 mg/kg (original dose 3.6 mg/kg, Cycle 5, Reason: Dose not tolerated), 2.4 mg/kg (original dose 3.6 mg/kg, Cycle 6, Reason: Provider Judgment) Administration: 260 mg (03/24/2019), 260 mg (04/14/2019), 200 mg (06/16/2019), 260 mg (05/05/2019), 200 mg (05/26/2019), 160 mg (07/07/2019)  for chemotherapy treatment.    05/04/2019 - 06/14/2019 Radiation Therapy   Adjuvant radiation therapy   06/2019 -  Anti-estrogen oral therapy   Anastrozole daily  09/29/2019 -  Chemotherapy   The patient had trastuzumab-dkst (OGIVRI) 546 mg in sodium chloride 0.9 % 250 mL chemo infusion, 8 mg/kg = 546 mg, Intravenous,  Once, 4 of 4  cycles Administration: 546 mg (09/29/2019), 399 mg (10/20/2019), 399 mg (11/10/2019), 399 mg (12/01/2019)  for chemotherapy treatment.      Observations/Objective:  There were no vitals filed for this visit. There is no height or weight on file to calculate BMI.  I have reviewed the data as listed CMP Latest Ref Rng & Units 11/10/2019 10/22/2019 09/29/2019  Glucose 70 - 99 mg/dL 149(H) 151(H) 123(H)  BUN 8 - 23 mg/dL _0 Creatinine 0.44 - 1.00 mg/dL 0.91 0.90 0.85  Sodium 135 - 145 mmol/L 138 138 140  Potassium 3.5 - 5.1 mmol/L 4.0 3.9 4.0  Chloride 98 - 111 mmol/L 107 104 105  CO2 22 - 32 mmol/L 21(L) 27 24  Calcium 8.9 - 10.3 mg/dL 9.4 10.5 10.0  Total Protein 6.5 - 8.1 g/dL 7.4 7.3 7.6  Total Bilirubin 0.3 - 1.2 mg/dL 0.6 0.5 0.5  Alkaline Phos 38 - 126 U/L 137(H) 120(H) 134(H)  AST 15 - 41 U/L 34 34 28  ALT 0 - 44 U/L 42 43(H) 30    Lab Results  Component Value Date   WBC 3.6 (L) 12/01/2019   HGB 11.5 (L) 12/01/2019   HCT 34.1 (L) 12/01/2019   MCV 104.3 (H) 12/01/2019   PLT 117 (L) 12/01/2019   NEUTROABS 2.3 12/01/2019      Assessment Plan:  Malignant neoplasm of upper-outer quadrant of left breast in female, estrogen receptor positive ([place]) With prior history of breast implants that were ruptured/replaced, patient had cellulitis LIQ left breast for 1 week, and UOQ asymmetry 1.5 cm, MRI right breast biopsy benign, left breast numerous masses 7 cm skin thickening: 2 biopsies from left breast: Grade 2-3 IDC ER PR positive, HER-2 positive, Ki-67 50% Clinical stage: T4 N0 M0 stage IIIb  Treatment plan 1. Neoadjuvant chemotherapy with [place] Perjeta 6 cyclescompleted 5/6/2020followed by Herceptin maintenance for 1 year completed 12/01/2019 2. Followed byBilmastectomieswith sentinel lymph node study 03/08/2019 3. Followed by adjuvant radiation therapystarted 05/04/2019-06/14/2019 4.Followed by adjuvant antiestrogen therapystarted  06/16/2019 ------------------------------------------------------------------------------------------------------------------------------------ 03/08/2019: Bilateral mastectomies: Left mastectomy: Grade 2 IDC spanning 7.5 cm, margins negative, right mastectomy: Benign, ER 95%, PR 5%, HER-2 1+ negative pathology lost the lymph node excision.   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  Scattered areas of bronchiectasis in the lungs.  Atherosclerosis and diverticulosis.  No evidence of metastatic disease.  Neratinib counseling: I discussed with her that there is a slight benefit to using neratinib maintenance for 1 year.  I went over the extennet study which showed about a 2.5% absolute difference in progression free survival  Neratinib counseling: Neratinib is up and HER-2 inhibitor that is currently approved for treatment of HER-2 positive breast cancer after conclusion of one year of Herceptin treatment. Based on the studies it does appear to have a progression free survival difference of 2% absolute value. The starting dose of the spill is 200 mg once daily. The greatest side effect of this medication is diarrhea. Patient will need to be on prophylactic antidiarrheal medications. Days 1 to 14: Loperamide 4 mg orally 3 times daily Days 15 to 56: Loperamide 4 mg orally twice daily Days 57 to 365: Loperamide 4 mg as needed (maximum: 16 mg/day) During the treatment will have to monitor  the liver functions. Apart from the diarrhea other side effects include fatigue 27%, skin rash 18%, muscle spasms in 11%.  Since the scan came back normal, we will get her port removed.   I discussed the assessment and treatment plan with the patient. The patient was provided an opportunity to ask questions and all were answered. The patient agreed with the plan and demonstrated an understanding of the instructions. The  patient was advised to call back or seek an in-person evaluation if the symptoms worsen or if the condition fails to improve as anticipated.   I provided 30 minutes of face-to-face MyChart video visit time during this encounter.    Rulon Eisenmenger, MD 12/09/2019   I, Molly Dorshimer, am acting as scribe for [name], MD.  I have reviewed the above documentation for accuracy and completeness, and I agree with the above.

## 2019-12-09 ENCOUNTER — Telehealth (HOSPITAL_BASED_OUTPATIENT_CLINIC_OR_DEPARTMENT_OTHER): Payer: Medicare Other | Admitting: Hematology and Oncology

## 2019-12-09 DIAGNOSIS — C50412 Malignant neoplasm of upper-outer quadrant of left female breast: Secondary | ICD-10-CM

## 2019-12-09 DIAGNOSIS — Z170 Estrogen receptor positive status [ER+]: Secondary | ICD-10-CM

## 2019-12-09 NOTE — Assessment & Plan Note (Signed)
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 Neoadjuvant chemotherapy with [place] Perjeta 6 cyclescompleted 5/6/2020followed by Herceptin maintenance for 1 year completed 12/01/2019 2. Followed byBilmastectomieswith sentinel lymph node study 03/08/2019 3. Followed by adjuvant radiation therapystarted 05/04/2019-06/14/2019 4.Followed by adjuvant antiestrogen therapystarted 06/16/2019 ------------------------------------------------------------------------------------------------------------------------------------ 03/08/2019: Bilateral mastectomies: Left mastectomy: Grade 2 IDC spanning 7.5 cm, margins negative, right mastectomy: Benign, ER 95%, PR 5%, HER-2 1+ negative pathology lost the lymph node excision.   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  Scattered areas of bronchiectasis in the lungs.  Atherosclerosis and diverticulosis.  No evidence of metastatic disease.  Neratinib counseling: I discussed with her that there is a slight benefit to using neratinib maintenance for 1 year.  I went over the extennet study which showed about a 2.5% absolute difference in progression free survival  Neratinib counseling: Neratinib is up and HER-2 inhibitor that is currently approved for treatment of HER-2 positive breast cancer after conclusion of one year of Herceptin treatment. Based on the studies it does appear to have a progression free survival difference of 2% absolute value. The starting dose of the spill is 200 mg once daily. The greatest side effect of this medication is diarrhea.  Patient will need to be on prophylactic antidiarrheal medications. Days 1 to 14: Loperamide 4 mg orally 3 times daily Days 15 to 56: Loperamide 4 mg orally twice daily Days 57 to 365: Loperamide 4 mg as needed (maximum: 16 mg/day) During the treatment will have to monitor the liver functions. Apart from the diarrhea other side effects include fatigue 27%, skin rash 18%, muscle spasms in 11%.  Since the scan came back normal, we will get her port removed.

## 2019-12-10 ENCOUNTER — Other Ambulatory Visit: Payer: Medicare Other

## 2019-12-10 DIAGNOSIS — L218 Other seborrheic dermatitis: Secondary | ICD-10-CM | POA: Diagnosis not present

## 2019-12-10 DIAGNOSIS — R918 Other nonspecific abnormal finding of lung field: Secondary | ICD-10-CM

## 2019-12-10 DIAGNOSIS — L668 Other cicatricial alopecia: Secondary | ICD-10-CM | POA: Diagnosis not present

## 2019-12-13 ENCOUNTER — Encounter: Payer: Self-pay | Admitting: Hematology and Oncology

## 2019-12-13 DIAGNOSIS — M7751 Other enthesopathy of right foot: Secondary | ICD-10-CM | POA: Diagnosis not present

## 2019-12-13 DIAGNOSIS — E119 Type 2 diabetes mellitus without complications: Secondary | ICD-10-CM | POA: Diagnosis not present

## 2019-12-13 DIAGNOSIS — M7752 Other enthesopathy of left foot: Secondary | ICD-10-CM | POA: Diagnosis not present

## 2019-12-16 ENCOUNTER — Telehealth: Payer: Medicare Other | Admitting: Hematology and Oncology

## 2019-12-20 DIAGNOSIS — H2513 Age-related nuclear cataract, bilateral: Secondary | ICD-10-CM | POA: Diagnosis not present

## 2019-12-20 LAB — HM DIABETES EYE EXAM

## 2019-12-21 ENCOUNTER — Other Ambulatory Visit: Payer: Self-pay

## 2019-12-21 ENCOUNTER — Other Ambulatory Visit (INDEPENDENT_AMBULATORY_CARE_PROVIDER_SITE_OTHER): Payer: Medicare Other

## 2019-12-21 ENCOUNTER — Encounter: Payer: Self-pay | Admitting: Family Medicine

## 2019-12-21 DIAGNOSIS — T783XXA Angioneurotic edema, initial encounter: Secondary | ICD-10-CM

## 2019-12-21 LAB — COMPREHENSIVE METABOLIC PANEL
ALT: 32 U/L (ref 0–35)
AST: 29 U/L (ref 0–37)
Albumin: 4.4 g/dL (ref 3.5–5.2)
Alkaline Phosphatase: 106 U/L (ref 39–117)
BUN: 22 mg/dL (ref 6–23)
CO2: 31 mEq/L (ref 19–32)
Calcium: 9.9 mg/dL (ref 8.4–10.5)
Chloride: 100 mEq/L (ref 96–112)
Creatinine, Ser: 0.88 mg/dL (ref 0.40–1.20)
GFR: 62.63 mL/min (ref >60.00)
Glucose, Bld: 142 mg/dL — ABNORMAL HIGH (ref 70–99)
Potassium: 3.9 mEq/L (ref 3.5–5.1)
Sodium: 137 mEq/L (ref 135–145)
Total Bilirubin: 0.5 mg/dL (ref 0.2–1.2)
Total Protein: 7.1 g/dL (ref 6.0–8.3)

## 2019-12-21 NOTE — Progress Notes (Signed)
No critical labs need to be addressed urgently. We will discuss labs in detail at upcoming office visit.   

## 2019-12-22 DIAGNOSIS — R609 Edema, unspecified: Secondary | ICD-10-CM | POA: Insufficient documentation

## 2019-12-22 NOTE — Assessment & Plan Note (Signed)
Follow closely on new regimen.

## 2019-12-22 NOTE — Assessment & Plan Note (Signed)
Stop amlodipine. Increase activity as able. Restart losartan/HCTZ at previous dose. Follow blood pressure at home... goal < 140/90.

## 2019-12-23 ENCOUNTER — Encounter: Payer: Self-pay | Admitting: Family Medicine

## 2019-12-23 ENCOUNTER — Other Ambulatory Visit: Payer: Self-pay

## 2019-12-23 ENCOUNTER — Ambulatory Visit (INDEPENDENT_AMBULATORY_CARE_PROVIDER_SITE_OTHER): Payer: Medicare Other | Admitting: Family Medicine

## 2019-12-23 VITALS — BP 140/82 | HR 82 | Temp 97.5°F | Ht 62.0 in | Wt 155.5 lb

## 2019-12-23 DIAGNOSIS — I700 Atherosclerosis of aorta: Secondary | ICD-10-CM | POA: Insufficient documentation

## 2019-12-23 DIAGNOSIS — I2510 Atherosclerotic heart disease of native coronary artery without angina pectoris: Secondary | ICD-10-CM | POA: Diagnosis not present

## 2019-12-23 DIAGNOSIS — I10 Essential (primary) hypertension: Secondary | ICD-10-CM | POA: Diagnosis not present

## 2019-12-23 DIAGNOSIS — E113293 Type 2 diabetes mellitus with mild nonproliferative diabetic retinopathy without macular edema, bilateral: Secondary | ICD-10-CM | POA: Diagnosis not present

## 2019-12-23 DIAGNOSIS — R9389 Abnormal findings on diagnostic imaging of other specified body structures: Secondary | ICD-10-CM

## 2019-12-23 DIAGNOSIS — R609 Edema, unspecified: Secondary | ICD-10-CM | POA: Diagnosis not present

## 2019-12-23 MED ORDER — METFORMIN HCL 500 MG PO TABS: 500.0000 mg | ORAL_TABLET | Freq: Two times a day (BID) | ORAL | 3 refills | Status: DC

## 2019-12-23 NOTE — Progress Notes (Signed)
Chief Complaint  Patient presents with  . 2 week FU, swelling and BP    History of Present Illness: HPI   74 year old female presents for follow up peripheral edema and HTN.   At last OV 2 weeks ago.[name] amlodipine was stopped and changed back to  Losartan HCTZ.   Swelling has improved.  No CP, no SOB, no lightheadedness.  BP at home has been 123-130/61-74... highest was once 147/66  HR 68-87   She has restarted exercising.[name] going 3 times a week.  She is getting stronger overall. BP Readings from Last 3 Encounters:  12/23/19 140/82  12/07/19 (!) 138/58  12/01/19 135/61   Wt Readings from Last 3 Encounters:  12/23/19 155 lb 8 oz (70.5 kg)  12/07/19 154 lb (69.9 kg)  12/01/19 155 lb 8 oz (70.5 kg)    DM: FBS 110-120, no > 20 or < 60.  She would like to change back to metformin intermediate release.   This visit occurred during the SARS-CoV-2 public health emergency.  Safety protocols were in place, including screening questions prior to the visit, additional usage of staff PPE, and extensive cleaning of exam room while observing appropriate contact time as indicated for disinfecting solutions.   COVID 19 screen:  No recent travel or known exposure to COVID19 The patient denies respiratory symptoms of COVID 19 at this time. The importance of social distancing was discussed today.     Review of Systems  Constitutional: Negative for chills and fever.  HENT: Negative for congestion and ear pain.   Eyes: Negative for pain and redness.  Respiratory: Negative for cough and shortness of breath.   Cardiovascular: Negative for chest pain, palpitations and leg swelling.  Gastrointestinal: Negative for abdominal pain, blood in stool, constipation, diarrhea, nausea and vomiting.  Genitourinary: Negative for dysuria.  Musculoskeletal: Negative for falls and myalgias.  Skin: Negative for rash.  Neurological: Negative for dizziness.  Psychiatric/Behavioral: Negative for depression.  The patient is not nervous/anxious.       Past Medical History:  Diagnosis Date  . Allergy   . Anemia   . 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 Blood transfusion without reported diagnosis   . Cancer ([place])    breast 08/2018  . Diverticulitis   . DJD (degenerative joint disease)   . DM type 2 (diabetes mellitus, type 2) ([place])   . Family history of breast cancer   . Fibrocystic breast disease   . GERD (gastroesophageal reflux disease)   . History of chicken pox   . Hypercholesteremia   . Hypertension   . Lichen planopilaris   . Urinary incontinence     reports that she has never smoked. She has never used smokeless tobacco. She reports previous alcohol use. She reports that she does not use drugs.   Current Outpatient Medications:  .  anastrozole (ARIMIDEX) 1 MG tablet, Take 1 tablet (1 mg total) by mouth daily., Disp: 90 tablet, Rfl: 3 .  aspirin 81 MG EC tablet, Take 81 mg by mouth daily. Swallow whole., Disp: , Rfl:  .  atorvastatin (LIPITOR) 10 MG tablet, TAKE 1 TABLET(10 MG) BY MOUTH DAILY, Disp: 90 tablet, Rfl: 3 .  Calcium Carbonate-Vit D-Min (CALTRATE 600+D PLUS MINERALS) 600-800 MG-UNIT TABS, Take 1 tablet by mouth in the morning and at bedtime., Disp: , Rfl:  .  clobetasol (TEMOVATE) 0.05 % external solution, Apply 1 application topically 2 (two) times daily as needed (scalp). Reported on  08/16/2015, Disp: , Rfl:  .  fluticasone (FLONASE) 50 MCG/ACT nasal spray, SHAKE LIQUID AND USE 2 SPRAYS IN EACH NOSTRIL EVERY DAY, Disp: 16 g, Rfl: 5 .  gabapentin (NEURONTIN) 300 MG capsule, Take 1 capsule (300 mg total) by mouth at bedtime., Disp: 90 capsule, Rfl: 3 .  glucose blood test strip, USE TO CHECK BLOOD SUGAR DAILY, Disp: 50 each, Rfl: 12 .  ketoconazole (NIZORAL) 2 % cream, APPLY TO THE AFFECTED AREA ON FACE TWICE DAILY UNTIL CLEAR, Disp: , Rfl:  .  loratadine (CLARITIN) 10 MG tablet, Take 10 mg by mouth daily., Disp: , Rfl:  .   losartan-hydrochlorothiazide (HYZAAR) 100-25 MG tablet, Take 1 tablet by mouth daily., Disp: 90 tablet, Rfl: 3 .  metFORMIN (GLUCOPHAGE-XR) 500 MG 24 hr tablet, Take 1 tablet (500 mg total) by mouth at bedtime., Disp: 90 tablet, Rfl: 1 .  Multiple Vitamins-Minerals (CENTRUM SILVER 50+WOMEN) TABS, Take 1 tablet by mouth daily., Disp: , Rfl:  .  dexamethasone (DECADRON) 0.1 % ophthalmic suspension, Place 1 drop into both eyes 2 (two) times daily. (Patient not taking: Reported on 12/23/2019), Disp: 15 mL, Rfl: 1   Observations/Objective: Blood pressure 140/82, pulse 82, temperature (!) 97.5 F (36.4 C), temperature source Temporal, height 5\' 2"  (1.575 m), weight 155 lb 8 oz (70.5 kg), last menstrual period 08/26/1992, SpO2 94 %.  Physical Exam Constitutional:      General: She is not in acute distress.    Appearance: Normal appearance. She is well-developed. She is not ill-appearing or toxic-appearing.  HENT:     Head: Normocephalic.     Right Ear: Hearing, tympanic membrane, ear canal and external ear normal. Tympanic membrane is not erythematous, retracted or bulging.     Left Ear: Hearing, tympanic membrane, ear canal and external ear normal. Tympanic membrane is not erythematous, retracted or bulging.     Nose: No mucosal edema or rhinorrhea.     Right Sinus: No maxillary sinus tenderness or frontal sinus tenderness.     Left Sinus: No maxillary sinus tenderness or frontal sinus tenderness.     Mouth/Throat:     Pharynx: Uvula midline.  Eyes:     General: Lids are normal. Lids are everted, no foreign bodies appreciated.     Conjunctiva/sclera: Conjunctivae normal.     Pupils: Pupils are equal, round, and reactive to light.  Neck:     Thyroid: No thyroid mass or thyromegaly.     Vascular: No carotid bruit.     Trachea: Trachea normal.  Cardiovascular:     Rate and Rhythm: Normal rate and regular rhythm.     Pulses: Normal pulses.     Heart sounds: Normal heart sounds, S1 normal and S2  normal. No murmur. No friction rub. No gallop.   Pulmonary:     Effort: Pulmonary effort is normal. No tachypnea or respiratory distress.     Breath sounds: Normal breath sounds. No decreased breath sounds, wheezing, rhonchi or rales.  Abdominal:     General: Bowel sounds are normal.     Palpations: Abdomen is soft.     Tenderness: There is no abdominal tenderness.  Musculoskeletal:     Cervical back: Normal range of motion and neck supple.  Skin:    General: Skin is warm and dry.     Findings: No rash.  Neurological:     Mental Status: She is alert.  Psychiatric:        Mood and Affect: Mood is not anxious or depressed.  Speech: Speech normal.        Behavior: Behavior normal. Behavior is cooperative.        Thought Content: Thought content normal.        Judgment: Judgment normal.      Assessment and Plan   Essential hypertension, benign Well controlled. Continue current medication.   Controlled type 2 diabetes with retinopathy ([place]) Well controlled on metformin but has some bloating with XR..wishes to change back to BID dosing of intermediate release  Diabetic retinopathy ([facility]) Recent OV with Dr. Michelene Heady.. stable, in one eye only.  Peripheral edema Resolved off amlodipine.  Aortic atherosclerosis ([facility]) Noted on CT chest.  Risk factor reduction... on statin, nonsmoker and DM/HTN well controlled.  Atherosclerosis of coronary artery of native heart without angina pectoris Asymptomatic but given 2 vessel disease seen.. refer to cardiology to consider stress testing or coronary artery CT.     Eliezer Lofts, MD

## 2019-12-23 NOTE — Assessment & Plan Note (Signed)
Noted on CT chest.  Risk factor reduction... on statin, nonsmoker and DM/HTN well controlled.

## 2019-12-23 NOTE — Assessment & Plan Note (Signed)
Well controlled. Continue current medication.  

## 2019-12-23 NOTE — Assessment & Plan Note (Addendum)
Well controlled on metformin but has some bloating with XR..wishes to change back to BID dosing of intermediate release

## 2019-12-23 NOTE — Assessment & Plan Note (Signed)
Asymptomatic but given 2 vessel disease seen.. refer to cardiology to consider stress testing or coronary artery CT.

## 2019-12-23 NOTE — Assessment & Plan Note (Signed)
Recent OV with Dr. Michelene Heady.. stable, in one eye only.

## 2019-12-23 NOTE — Patient Instructions (Signed)
Continue current meds.  Cardiology will call to set up referral in next few weeks.

## 2019-12-23 NOTE — Assessment & Plan Note (Signed)
Resolved off amlodipine. 

## 2019-12-24 ENCOUNTER — Telehealth: Payer: Self-pay | Admitting: Adult Health

## 2019-12-24 DIAGNOSIS — M65321 Trigger finger, right index finger: Secondary | ICD-10-CM | POA: Diagnosis not present

## 2019-12-24 NOTE — Telephone Encounter (Signed)
R/s appt per 4/29 sch message - unable to reach pt . Left message with appt date and time   

## 2019-12-25 DIAGNOSIS — R42 Dizziness and giddiness: Secondary | ICD-10-CM

## 2019-12-25 HISTORY — DX: Dizziness and giddiness: R42

## 2019-12-27 ENCOUNTER — Other Ambulatory Visit: Payer: Self-pay

## 2019-12-27 ENCOUNTER — Encounter: Payer: Self-pay | Admitting: Cardiology

## 2019-12-27 ENCOUNTER — Ambulatory Visit (INDEPENDENT_AMBULATORY_CARE_PROVIDER_SITE_OTHER): Payer: Medicare Other | Admitting: Cardiology

## 2019-12-27 VITALS — BP 160/80 | HR 80 | Ht 62.0 in | Wt 156.4 lb

## 2019-12-27 DIAGNOSIS — I2510 Atherosclerotic heart disease of native coronary artery without angina pectoris: Secondary | ICD-10-CM

## 2019-12-27 DIAGNOSIS — I10 Essential (primary) hypertension: Secondary | ICD-10-CM | POA: Diagnosis not present

## 2019-12-27 DIAGNOSIS — E7800 Pure hypercholesterolemia, unspecified: Secondary | ICD-10-CM | POA: Diagnosis not present

## 2019-12-27 MED ORDER — ATORVASTATIN CALCIUM 20 MG PO TABS: 20.0000 mg | ORAL_TABLET | Freq: Every day | ORAL | 3 refills | Status: DC

## 2019-12-27 NOTE — Patient Instructions (Signed)
Medication Instructions:  Your physician has recommended you make the following change in your medication:   INCREASE Atorvastatin (Lipitor) to 20 mg daily. An Rx has been sent to your pharmacy.   *If you need a refill on your cardiac medications before your next appointment, please call your pharmacy*   Lab Work: Your physician recommends that you return for a FASTING lipid profile: in 6 months.  Please have your lab drain a few days prior to your next appointment at the [facility] medical mall. You do not need an appointment. Their hours are Mon-Fri 7am-6pm.   If you have labs (blood work) drawn today and your tests are completely normal, you will receive your results only by: [name] MyChart Message (if you have MyChart) OR . A paper copy in the mail If you have any lab test that is abnormal or we need to change your treatment, we will call you to review the results.   Testing/Procedures: None ordered   Follow-Up: At [facility], you and your health needs are our priority.  As part of our continuing mission to provide you with exceptional heart care, we have created designated Provider Care Teams.  These Care Teams include your primary Cardiologist (physician) and Advanced Practice Providers (APPs -  Physician Assistants and Nurse Practitioners) who all work together to provide you with the care you need, when you need it.  We recommend signing up for the patient portal called "MyChart".  Sign up information is provided on this After Visit Summary.  MyChart is used to connect with patients for Virtual Visits (Telemedicine).  Patients are able to view lab/test results, encounter notes, upcoming appointments, etc.  Non-urgent messages can be sent to your provider as well.   To learn more about what you can do with MyChart, go to [email or web address].    Your next appointment:   6 month(s)  The format for your next appointment:   In Person  Provider:    You may see Kate Sable, MD or one of the following Advanced Practice Providers on your designated Care Team:    Murray Hodgkins, NP  Christell Faith, PA-C  Marrianne Mood, PA-C    Other Instructions N/A

## 2019-12-27 NOTE — Progress Notes (Signed)
Cardiology Office Note:    Date:  12/27/2019   ID:  Katie [name], DOB [birth year], MRN [id number]  PCP:  [name], MD  Cardiologist:  Kate Sable, MD  Electrophysiologist:  None   Referring MD: [name], MD   Chief Complaint  Patient presents with  . New Patient (Initial Visit)    Abnormal CT of the chest; Meds verbally reviewed with patient.    History of Present Illness:    Katie [name] is a 74 y.o. female with a hx of hypertension, hyperlipidemia, diabetes, breast cancer status post bilateral mastectomy, chemo, radiation and immunotherapy who presents due to an abnormal CT. patient just finished chemotherapy for breast cancer 4 weeks ago.  She had a chest CT to evaluate for the presence of any residual disease. Chest CT obtained on 12/08/2019 showed calcified plaque in the left main, LAD and RCA.  She denies any history of heart disease.  Denies chest pain or shortness of breath at rest or with exertion.  Denies edema.  Otherwise feels okay.  Has a recent echocardiogram performed on January 2021 to evaluate EF while undergoing chemo.  Echocardiogram showed normal systolic function, EF 60 to 65%.  She checks her blood pressure frequently at home which is usually [id number] systolic.  Past Medical History:  Diagnosis Date  . Allergy   . Anemia   . 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 Blood transfusion without reported diagnosis   . Cancer ([place])    breast 08/2018  . Diverticulitis   . DJD (degenerative joint disease)   . DM type 2 (diabetes mellitus, type 2) ([place])   . Family history of breast cancer   . Fibrocystic breast disease   . GERD (gastroesophageal reflux disease)   . History of chicken pox   . Hypercholesteremia   . Hypertension   . Lichen planopilaris   . Urinary incontinence     Past Surgical History:  Procedure Laterality Date  . BREAST IMPLANT REMOVAL Bilateral 03/08/2019   Procedure: Removal of Bilateral Breast Implants;   Surgeon: Rolm Bookbinder, MD;  Location: [place];  Service: General;  Laterality: Bilateral;  . BREAST SURGERY  02/1975   Left Breast Biopsy-Fibrocystic Disease  . BREAST SURGERY  10/1981   Bialteral Capsulectomy Breast Surgery  . BREAST SURGERY  07/1998   Replace Bilateral Silicone Implants  . BREAST SURGERY  01/1976   Bilater breast surgery to remove fibrocystic tissue and replace with silicone implants  . CHOLECYSTECTOMY N/A 07/08/2016   Procedure: LAPAROSCOPIC CHOLECYSTECTOMY;  Surgeon: [name], MD;  Location: [facility];  Service: General;  Laterality: N/A;  . COLONOSCOPY WITH PROPOFOL N/A 11/07/2017   Procedure: COLONOSCOPY WITH PROPOFOL;  Surgeon: Jonathon Bellows, MD;  Location: [facility];  Service: Gastroenterology;  Laterality: N/A;  . DILATION AND CURETTAGE OF UTERUS  01/1994  . ESOPHAGOGASTRODUODENOSCOPY (EGD) WITH PROPOFOL N/A 12/05/2016   Procedure: ESOPHAGOGASTRODUODENOSCOPY (EGD) WITH PROPOFOL;  Surgeon: Jonathon Bellows, MD;  Location: [facility] ENDOSCOPY;  Service: Endoscopy;  Laterality: N/A;  . FINGER SURGERY  2001 & 2003   Trigger Finger  . FOOT SURGERY  1980   To Relieve Pinched Nerve  . FOOT SURGERY  07/2008   Left Foot-Plantar Fasciitis  . JOINT REPLACEMENT  04/19/2013   Hip Replacement-Right  . MASTECTOMY W/ SENTINEL NODE BIOPSY Bilateral 03/08/2019   Procedure: BILATERAL MASTECTOMIES WITH LEFT AXILLARY SENTINEL LYMPH NODE BIOPSY AND BLUE DYE INJECTION;  Surgeon: Rolm Bookbinder, MD;  Location:  [place] [facility];  Service: General;  Laterality: Bilateral;  . PARTIAL HIP ARTHROPLASTY Right   . PLACEMENT OF BREAST IMPLANTS  12/26/2004   Replace Failed Implants  . REPLACEMENT TOTAL KNEE Left    2019-  . RHINOPLASTY  03/1964   To Correct Deviated Septum    Current Medications: Current Meds  Medication Sig  . anastrozole (ARIMIDEX) 1 MG tablet Take 1 tablet (1 mg total) by mouth daily.  [name] aspirin 81 MG EC tablet Take 81 mg by mouth daily. Swallow whole.  [name] atorvastatin  (LIPITOR) 20 MG tablet Take 1 tablet (20 mg total) by mouth daily.  . Calcium Carbonate-Vit D-Min (CALTRATE 600+D PLUS MINERALS) 600-800 MG-UNIT TABS Take 1 tablet by mouth in the morning and at bedtime.  . clobetasol (TEMOVATE) 0.05 % external solution Apply 1 application topically 2 (two) times daily as needed (scalp). Reported on 08/16/2015  . fluticasone (FLONASE) 50 MCG/ACT nasal spray SHAKE LIQUID AND USE 2 SPRAYS IN EACH NOSTRIL EVERY DAY  . gabapentin (NEURONTIN) 300 MG capsule Take 1 capsule (300 mg total) by mouth at bedtime.  [name] glucose blood test strip USE TO CHECK BLOOD SUGAR DAILY  . ketoconazole (NIZORAL) 2 % cream APPLY TO THE AFFECTED AREA ON FACE TWICE DAILY UNTIL CLEAR  . loratadine (CLARITIN) 10 MG tablet Take 10 mg by mouth daily.  [name] losartan-hydrochlorothiazide (HYZAAR) 100-25 MG tablet Take 1 tablet by mouth daily.  . metFORMIN (GLUCOPHAGE) 500 MG tablet Take 1 tablet (500 mg total) by mouth 2 (two) times daily with a meal.  . Multiple Vitamins-Minerals (CENTRUM SILVER 50+WOMEN) TABS Take 1 tablet by mouth daily.  [name] Propylene Glycol (SYSTANE BALANCE OP) Apply to eye as needed.  . [DISCONTINUED] atorvastatin (LIPITOR) 10 MG tablet TAKE 1 TABLET(10 MG) BY MOUTH DAILY     Allergies:   Erythromycin, Ciprofloxacin, Daypro [oxaprozin], Enalapril maleate, Nsaids, and Vioxx [rofecoxib]   Social History   Socioeconomic History  . Marital status: Married    Spouse name: Not on file  . Number of children: 1  . Years of education: Not on file  . Highest education level: Not on file  Occupational History  . Occupation: Retired  Tobacco Use  . Smoking status: Never Smoker  . Smokeless tobacco: Never Used  Substance and Sexual Activity  . Alcohol use: Not Currently    Alcohol/week: 0.0 standard drinks    Comment: rarely  . Drug use: No  . Sexual activity: Not Currently    Partners: Male  Other Topics Concern  . Not on file  Social History Narrative   Married for 37  years.    She has one child and one stepston.      Social Determinants of Health   Financial Resource Strain:   . Difficulty of Paying Living Expenses:   Food Insecurity:   . Worried About Charity fundraiser in the Last Year:   . Arboriculturist in the Last Year:   Transportation Needs: No Transportation Needs  . Lack of Transportation (Medical): No  . Lack of Transportation (Non-Medical): No  Physical Activity:   . Days of Exercise per Week:   . Minutes of Exercise per Session:   Stress:   . Feeling of Stress :   Social Connections:   . Frequency of Communication with Friends and Family:   . Frequency of Social Gatherings with Friends and Family:   . Attends Religious Services:   . Active Member of Clubs or Organizations:   .  Attends Archivist Meetings:   [name] Marital Status:      Family History: The patient's family history includes Arthritis in her mother; Breast cancer (age of onset: 81) in her sister; Coronary artery disease in her brother; Diabetes in her father; Heart attack in her brother and father; Heart disease in her father; Hypertension in her brother and mother; Leukemia in her brother.  ROS:   Please see the history of present illness.     All other systems reviewed and are negative.  EKGs/Labs/Other Studies Reviewed:    The following studies were reviewed today:   EKG:  EKG is  ordered today.  The ekg ordered today demonstrates normal sinus rhythm, low voltage QRS  Recent Labs: 01/05/2019: Magnesium 1.5 12/01/2019: Hemoglobin 11.5; Platelet Count 117 12/21/2019: ALT 32; BUN 22; Creatinine, Ser 0.88; Potassium 3.9; Sodium 137  Recent Lipid Panel    Component Value Date/Time   CHOL 164 10/22/2019 0755   TRIG 263.0 (H) 10/22/2019 0755   HDL 41.20 10/22/2019 0755   CHOLHDL 4 10/22/2019 0755   VLDL 52.6 (H) 10/22/2019 0755   LDLCALC 25 01/06/2019 0846   LDLDIRECT 77.0 10/22/2019 0755    Physical Exam:    VS:  BP (!) 160/80 (BP Location:  Right Arm, Patient Position: Sitting, Cuff Size: Normal)   Pulse 80   Ht 5\' 2"  (1.575 m)   Wt 156 lb 6 oz (70.9 kg)   LMP 08/26/1992 (Approximate)   SpO2 95%   BMI 28.60 kg/m     Wt Readings from Last 3 Encounters:  12/27/19 156 lb 6 oz (70.9 kg)  12/23/19 155 lb 8 oz (70.5 kg)  12/07/19 154 lb (69.9 kg)     GEN:  Well nourished, well developed in no acute distress HEENT: Normal NECK: No JVD; No carotid bruits LYMPHATICS: No lymphadenopathy CARDIAC: RRR, no murmurs, rubs, gallops RESPIRATORY:  Clear to auscultation without rales, wheezing or rhonchi  ABDOMEN: Soft, non-tender, non-distended MUSCULOSKELETAL:  No edema; No deformity  SKIN: Warm and dry NEUROLOGIC:  Alert and oriented x 3 PSYCHIATRIC:  Normal affect   ASSESSMENT:    1. Coronary artery disease involving native coronary artery of native heart without angina pectoris   2. Essential hypertension   3. Pure hypercholesterolemia    PLAN:    In order of problems listed above:  1. Calcified plaque noted in the left main, LAD, RCA on recent CT scan.  Patient is without chest pain or shortness of breath.  Last LDL was 77.  Continue aspirin 81 mg, increase Lipitor to 20 mg daily .  No indication for additional testing at this time since patient is asymptomatic.  Continue guideline directed management of risk factors of hypertension, hyperlipidemia, diabetes. 2. History of hypertension, BP elevated today, usually adequately controlled.  Continue current BP meds.  Continue to monitor.  If increased at follow-up visit, will consider titrating BP meds. 3. History of hyperlipidemia, LDL at goal of less than 100, currently at 77.  Triglycerides elevated.  Increase statin as above.  Get fasting lipid profile prior to follow-up visit.  Will consider Vascepa if follow-up lipid panel still shows elevated triglycerides.  Follow-up 6 months.  This note was generated in part or whole with voice recognition software. Voice recognition  is usually quite accurate but there are transcription errors that can and very often do occur. I apologize for any typographical errors that were not detected and corrected.  Medication Adjustments/Labs and Tests Ordered: Current medicines are reviewed  at length with the patient today.  Concerns regarding medicines are outlined above.  Orders Placed This Encounter  Procedures  . Lipid panel  . EKG 12-Lead   Meds ordered this encounter  Medications  . atorvastatin (LIPITOR) 20 MG tablet    Sig: Take 1 tablet (20 mg total) by mouth daily.    Dispense:  90 tablet    Refill:  3    Patient Instructions  Medication Instructions:  Your physician has recommended you make the following change in your medication:   INCREASE Atorvastatin (Lipitor) to 20 mg daily. An Rx has been sent to your pharmacy.   *If you need a refill on your cardiac medications before your next appointment, please call your pharmacy*   Lab Work: Your physician recommends that you return for a FASTING lipid profile: in 6 months.  Please have your lab drain a few days prior to your next appointment at the [facility] medical mall. You do not need an appointment. Their hours are Mon-Fri 7am-6pm.   If you have labs (blood work) drawn today and your tests are completely normal, you will receive your results only by: [name] MyChart Message (if you have MyChart) OR . A paper copy in the mail If you have any lab test that is abnormal or we need to change your treatment, we will call you to review the results.   Testing/Procedures: None ordered   Follow-Up: At [facility], you and your health needs are our priority.  As part of our continuing mission to provide you with exceptional heart care, we have created designated Provider Care Teams.  These Care Teams include your primary Cardiologist (physician) and Advanced Practice Providers (APPs -  Physician Assistants and Nurse Practitioners) who all work together to provide you  with the care you need, when you need it.  We recommend signing up for the patient portal called "MyChart".  Sign up information is provided on this After Visit Summary.  MyChart is used to connect with patients for Virtual Visits (Telemedicine).  Patients are able to view lab/test results, encounter notes, upcoming appointments, etc.  Non-urgent messages can be sent to your provider as well.   To learn more about what you can do with MyChart, go to [email or web address].    Your next appointment:   6 month(s)  The format for your next appointment:   In Person  Provider:    You may see Kate Sable, MD or one of the following Advanced Practice Providers on your designated Care Team:    Murray Hodgkins, NP  Christell Faith, PA-C  Marrianne Mood, PA-C    Other Instructions N/A    Signed, Kate Sable, MD  12/27/2019 10:19 AM    [place]

## 2020-01-06 DIAGNOSIS — Z01818 Encounter for other preprocedural examination: Secondary | ICD-10-CM | POA: Diagnosis not present

## 2020-01-10 DIAGNOSIS — Z452 Encounter for adjustment and management of vascular access device: Secondary | ICD-10-CM | POA: Diagnosis not present

## 2020-01-10 DIAGNOSIS — Z853 Personal history of malignant neoplasm of breast: Secondary | ICD-10-CM | POA: Diagnosis not present

## 2020-01-26 ENCOUNTER — Ambulatory Visit (INDEPENDENT_AMBULATORY_CARE_PROVIDER_SITE_OTHER): Payer: Medicare Other | Admitting: Pulmonary Disease

## 2020-01-26 ENCOUNTER — Encounter: Payer: Self-pay | Admitting: Pulmonary Disease

## 2020-01-26 ENCOUNTER — Other Ambulatory Visit: Payer: Self-pay

## 2020-01-26 VITALS — BP 150/70 | HR 65 | Temp 97.5°F | Ht 62.0 in | Wt 159.6 lb

## 2020-01-26 DIAGNOSIS — C50412 Malignant neoplasm of upper-outer quadrant of left female breast: Secondary | ICD-10-CM | POA: Diagnosis not present

## 2020-01-26 DIAGNOSIS — Z170 Estrogen receptor positive status [ER+]: Secondary | ICD-10-CM

## 2020-01-26 DIAGNOSIS — J479 Bronchiectasis, uncomplicated: Secondary | ICD-10-CM | POA: Diagnosis not present

## 2020-01-26 DIAGNOSIS — I10 Essential (primary) hypertension: Secondary | ICD-10-CM | POA: Diagnosis not present

## 2020-01-26 DIAGNOSIS — H2511 Age-related nuclear cataract, right eye: Secondary | ICD-10-CM | POA: Diagnosis not present

## 2020-01-26 NOTE — Patient Instructions (Signed)
We will schedule breathing test.   Follow-up in 2 months.

## 2020-01-26 NOTE — Progress Notes (Signed)
Subjective:    Patient ID: Katie [name], female    DOB: Dec 24, 1945, 74 y.o.   MRN: [id number]  HPI The patient is a 74 year old lifelong never smoker with a history of left HER-2 positive breast cancer with neoadjuvant chemotherapy, bilateral mastectomies, radiation completed all therapies in April 2021 has resolved daily.  She is kindly referred by Dr. [name] due to abnormal chest CT.  Chest CT was performed on April 14 for post therapy disease staging.  Patient was noted to have some bronchiectasis with mild mucus impaction.  She also has some postradiation changes on the left lung.  The patient has not had any symptoms of fevers, chills or intractable cough.  She does have occasional morning cough that she relates to postnasal drip.  This only lasts a few minutes after waking up.  It is nonproductive to productive of just clear sputum and scant amounts.  Related to anastrozole but never before that.  She has not had any environmental exposures.  Lifelong never smoker as noted.  No chest pain, orthopnea or paroxysmal nocturnal dyspnea.  No lower extremity edema and no calf tenderness.  He does have mild issues with neuropathy post chemotherapy. No weight loss or anorexia.  Occasional heartburn but no significant reflux issues.  Does not endorse any other symptomatology.  Review of Systems A 10 point review of systems was performed and it is as noted above otherwise negative.  Past Medical History:  Diagnosis Date  . Allergy   . Anemia   . 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 Blood transfusion without reported diagnosis   . Cancer ([place])    breast 08/2018  . Diverticulitis   . DJD (degenerative joint disease)   . DM type 2 (diabetes mellitus, type 2) ([place])   . Family history of breast cancer   . Fibrocystic breast disease   . GERD (gastroesophageal reflux disease)   . History of chicken pox   . Hypercholesteremia   . Hypertension   . Lichen planopilaris     . Urinary incontinence    Past Surgical History:  Procedure Laterality Date  . BREAST IMPLANT REMOVAL Bilateral 03/08/2019   Procedure: Removal of Bilateral Breast Implants;  Surgeon: Rolm Bookbinder, MD;  Location: [place];  Service: General;  Laterality: Bilateral;  . BREAST SURGERY  02/1975   Left Breast Biopsy-Fibrocystic Disease  . BREAST SURGERY  10/1981   Bialteral Capsulectomy Breast Surgery  . BREAST SURGERY  07/1998   Replace Bilateral Silicone Implants  . BREAST SURGERY  01/1976   Bilater breast surgery to remove fibrocystic tissue and replace with silicone implants  . CHOLECYSTECTOMY N/A 07/08/2016   Procedure: LAPAROSCOPIC CHOLECYSTECTOMY;  Surgeon: [name], MD;  Location: [facility];  Service: General;  Laterality: N/A;  . COLONOSCOPY WITH PROPOFOL N/A 11/07/2017   Procedure: COLONOSCOPY WITH PROPOFOL;  Surgeon: Jonathon Bellows, MD;  Location[facility];  Service: Gastroenterology;  Laterality: N/A;  . DILATION AND CURETTAGE OF UTERUS  01/1994  . ESOPHAGOGASTRODUODENOSCOPY (EGD) WITH PROPOFOL N/A 12/05/2016   Procedure: ESOPHAGOGASTRODUODENOSCOPY (EGD) WITH PROPOFOL;  Surgeon: Jonathon Bellows, MD;  Location: [facility] ENDOSCOPY;  Service: Endoscopy;  Laterality: N/A;  . FINGER SURGERY  2001 & 2003   Trigger Finger  . FOOT SURGERY  1980   To Relieve Pinched Nerve  . FOOT SURGERY  07/2008   Left Foot-Plantar Fasciitis  . JOINT REPLACEMENT  04/19/2013   Hip Replacement-Right  . MASTECTOMY W/ SENTINEL NODE BIOPSY  Bilateral 03/08/2019   Procedure: BILATERAL MASTECTOMIES WITH LEFT AXILLARY SENTINEL LYMPH NODE BIOPSY AND BLUE DYE INJECTION;  Surgeon: Rolm Bookbinder, MD;  Location: [place];  Service: General;  Laterality: Bilateral;  . PARTIAL HIP ARTHROPLASTY Right   . PLACEMENT OF BREAST IMPLANTS  12/26/2004   Replace Failed Implants  . REPLACEMENT TOTAL KNEE Left    2019-  . RHINOPLASTY  03/1964   To Correct Deviated Septum   Family History  Problem Relation Age of Onset   . Arthritis Mother   . Hypertension Mother   . Heart disease Father   . Diabetes Father   . Heart attack Father   . Breast cancer Sister 32       d. 85  . Hypertension Brother   . Leukemia Brother   . Coronary artery disease Brother   . Heart attack Brother    Social History   Tobacco Use  . Smoking status: Never Smoker  . Smokeless tobacco: Never Used  Substance Use Topics  . 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  She also teaches at [employer], mostly remote classes currently.  No exotic hobbies.  Has 1 cat as a pet no exotic pets or birds.  Previously has lived in [place] and [place].   Allergies  Allergen Reactions  . Erythromycin Anaphylaxis    REACTION: Rash, hives  . Ciprofloxacin Rash  . Daypro [Oxaprozin] Rash  . Enalapril Maleate Nausea Only  . Nsaids Nausea Only  . Vioxx [Rofecoxib] Nausea Only   Current Meds  Medication Sig  . anastrozole (ARIMIDEX) 1 MG tablet Take 1 tablet (1 mg total) by mouth daily.  [name] aspirin 81 MG EC tablet Take 81 mg by mouth daily. Swallow whole.  [name] atorvastatin (LIPITOR) 20 MG tablet Take 1 tablet (20 mg total) by mouth daily.  . Calcium Carbonate-Vit D-Min (CALTRATE 600+D PLUS MINERALS) 600-800 MG-UNIT TABS Take 1 tablet by mouth in the morning and at bedtime.  . clobetasol (TEMOVATE) 0.05 % external solution Apply 1 application topically 2 (two) times daily as needed (scalp). Reported on 08/16/2015  . fluticasone (FLONASE) 50 MCG/ACT nasal spray SHAKE LIQUID AND USE 2 SPRAYS IN EACH NOSTRIL EVERY DAY  . gabapentin (NEURONTIN) 300 MG capsule Take 1 capsule (300 mg total) by mouth at bedtime.  [name] glucose blood test strip USE TO CHECK BLOOD SUGAR DAILY  . ketoconazole (NIZORAL) 2 % cream APPLY TO THE AFFECTED AREA ON FACE TWICE DAILY UNTIL CLEAR  . loratadine (CLARITIN) 10 MG tablet Take 10 mg by mouth  daily.  [name] losartan-hydrochlorothiazide (HYZAAR) 100-25 MG tablet Take 1 tablet by mouth daily.  . metFORMIN (GLUCOPHAGE) 500 MG tablet Take 1 tablet (500 mg total) by mouth 2 (two) times daily with a meal.  . Multiple Vitamins-Minerals (CENTRUM SILVER 50+WOMEN) TABS Take 1 tablet by mouth daily.  Vladimir Faster Glycol-Propyl Glycol (SYSTANE OP) Apply to eye.   Immunization History  Administered Date(s) Administered  . Fluad Quad(high Dose 65+) 04/30/2019  . Hep A / Hep B 05/29/2009, 07/03/2009, 11/24/2009  . Influenza, High Dose Seasonal PF 04/18/2014, 04/13/2015, 04/30/2018  . Influenza, Seasonal, Injecte, Preservative Fre 06/26/2016  . Influenza-Unspecified 05/17/2009, 04/02/2011, 05/20/2012, 06/02/2013, 07/01/2013, 04/18/2014, 04/13/2015, 06/27/2016, 04/11/2017  . PFIZER SARS-COV-2 Vaccination 10/06/2019, 10/27/2019  . Pneumococcal Conjugate-13 04/18/2014  . Pneumococcal Polysaccharide-23 10/06/2015  . Td 05/17/2009  . Typhoid  Inactivated 05/17/2009  . Yellow Fever 05/17/2009  . Zoster 04/02/2011       Objective:   Physical Exam BP (!) 150/70 (BP Location: Right Arm, Patient Position: Sitting, Cuff Size: Normal)   Pulse 65   Temp (!) 97.5 F (36.4 C) (Temporal)   Ht 5' 2"  (1.575 m)   Wt 159 lb 9.6 oz (72.4 kg)   LMP 08/26/1992 (Approximate)   SpO2 98%   BMI 29.19 kg/m   GENERAL: Awake, alert, fully ambulatory, no acute distress. HEAD: Normocephalic, atraumatic.  EYES: Pupils equal, round, reactive to light.  No scleral icterus.  MOUTH: Nose/mouth/throat not examined due to masking requirements for COVID 19. NECK: Supple. No thyromegaly. Trachea midline. No JVD.  No adenopathy. PULMONARY: Lungs clear to auscultation bilaterally. CARDIOVASCULAR: S1 and S2. Regular rate and rhythm.  No rubs murmurs or gallops heard. GASTROINTESTINAL: Benign. MUSCULOSKELETAL: No joint deformity, no clubbing, no edema.  NEUROLOGIC: Awake, alert, no focal deficits.  Speech is fluent.  No gait  disturbance noted, fully ambulatory. SKIN: Intact,warm,dry. PSYCH: Mood and behavior appropriate.       Assessment & Plan:     ICD-10-CM   1. Bronchiectasis without complication ([place])  W86.7 Pulmonary Function Test [facility] Only   Patient is symptomatic in this regard Findings are mild on CT Will obtain PFTs  2. Malignant neoplasm of upper-outer quadrant of left breast in female, estrogen receptor positive [facility])  C50.412    Z17.0    09/16/2018: Stage IIIB (cT4, cN0, cM0, G3, ER+, PR+, HER2+) On Anastrozole daily Status post chemo XRT This issue adds complexity to her management   Discussion:  Patient CT shows some mild bronchiectasis with rare mucus plugging.  Patient is totally asymptomatic in this regard.  No evidence of other systemic disease.  Will obtain PFTs to get a baseline on her pulmonary function but at present would just only follow this issue expectantly.  Renold Don, MD [facility]   *This note was dictated using voice recognition software/Dragon.  Despite best efforts to proofread, errors can occur which can change the meaning.  Any change was purely unintentional.

## 2020-01-27 ENCOUNTER — Inpatient Hospital Stay (HOSPITAL_BASED_OUTPATIENT_CLINIC_OR_DEPARTMENT_OTHER): Payer: Medicare Other | Admitting: Adult Health

## 2020-01-27 ENCOUNTER — Encounter: Payer: Self-pay | Admitting: Adult Health

## 2020-01-27 ENCOUNTER — Inpatient Hospital Stay: Payer: Medicare Other | Attending: Hematology and Oncology

## 2020-01-27 ENCOUNTER — Other Ambulatory Visit: Payer: Self-pay

## 2020-01-27 VITALS — BP 131/53 | HR 61 | Temp 98.3°F | Resp 16 | Ht 62.0 in | Wt 160.0 lb

## 2020-01-27 DIAGNOSIS — C50412 Malignant neoplasm of upper-outer quadrant of left female breast: Secondary | ICD-10-CM

## 2020-01-27 DIAGNOSIS — Z9221 Personal history of antineoplastic chemotherapy: Secondary | ICD-10-CM | POA: Insufficient documentation

## 2020-01-27 DIAGNOSIS — Z79811 Long term (current) use of aromatase inhibitors: Secondary | ICD-10-CM | POA: Insufficient documentation

## 2020-01-27 DIAGNOSIS — Z9013 Acquired absence of bilateral breasts and nipples: Secondary | ICD-10-CM | POA: Diagnosis not present

## 2020-01-27 DIAGNOSIS — Z923 Personal history of irradiation: Secondary | ICD-10-CM | POA: Diagnosis not present

## 2020-01-27 DIAGNOSIS — I2510 Atherosclerotic heart disease of native coronary artery without angina pectoris: Secondary | ICD-10-CM

## 2020-01-27 DIAGNOSIS — Z170 Estrogen receptor positive status [ER+]: Secondary | ICD-10-CM | POA: Insufficient documentation

## 2020-01-27 LAB — CMP (CANCER CENTER ONLY)
ALT: 26 U/L (ref 0–44)
AST: 25 U/L (ref 15–41)
Albumin: 4.1 g/dL (ref 3.5–5.0)
Alkaline Phosphatase: 95 U/L (ref 38–126)
Anion gap: 11 (ref 5–15)
BUN: 19 mg/dL (ref 8–23)
CO2: 27 mmol/L (ref 22–32)
Calcium: 9.6 mg/dL (ref 8.9–10.3)
Chloride: 97 mmol/L — ABNORMAL LOW (ref 98–111)
Creatinine: 0.99 mg/dL (ref 0.44–1.00)
GFR, Est AFR Am: >60 mL/min (ref >60)
GFR, Estimated: 56 mL/min — ABNORMAL LOW (ref >60)
Glucose, Bld: 150 mg/dL — ABNORMAL HIGH (ref 70–99)
Potassium: 4.1 mmol/L (ref 3.5–5.1)
Sodium: 135 mmol/L (ref 135–145)
Total Bilirubin: 0.7 mg/dL (ref 0.3–1.2)
Total Protein: 7.2 g/dL (ref 6.5–8.1)

## 2020-01-27 LAB — CBC WITH DIFFERENTIAL (CANCER CENTER ONLY)
Abs Immature Granulocytes: 0.01 10*3/uL (ref 0.00–0.07)
Basophils Absolute: 0 10*3/uL (ref 0.0–0.1)
Basophils Relative: 0 %
Eosinophils Absolute: 0.2 10*3/uL (ref 0.0–0.5)
Eosinophils Relative: 3 %
HCT: 34.3 % — ABNORMAL LOW (ref 36.0–46.0)
Hemoglobin: 11.9 g/dL — ABNORMAL LOW (ref 12.0–15.0)
Immature Granulocytes: 0 %
Lymphocytes Relative: 18 %
Lymphs Abs: 0.9 10*3/uL (ref 0.7–4.0)
MCH: 36 pg — ABNORMAL HIGH (ref 26.0–34.0)
MCHC: 34.7 g/dL (ref 30.0–36.0)
MCV: 103.6 fL — ABNORMAL HIGH (ref 80.0–100.0)
Monocytes Absolute: 0.5 10*3/uL (ref 0.1–1.0)
Monocytes Relative: 9 %
Neutro Abs: 3.6 10*3/uL (ref 1.7–7.7)
Neutrophils Relative %: 70 %
Platelet Count: 130 10*3/uL — ABNORMAL LOW (ref 150–400)
RBC: 3.31 MIL/uL — ABNORMAL LOW (ref 3.87–5.11)
RDW: 14.1 % (ref 11.5–15.5)
WBC Count: 5.1 10*3/uL (ref 4.0–10.5)
nRBC: 0 % (ref 0.0–0.2)

## 2020-01-27 NOTE — Assessment & Plan Note (Addendum)
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 Neoadjuvant chemotherapy with Waurika Perjeta 6 cyclescompleted 5/6/2020followed by Herceptin maintenance for 1 year completed 12/01/2019 2. Followed byBilmastectomieswith sentinel lymph node study 03/08/2019 3. Followed by adjuvant radiation therapystarted 05/04/2019-06/14/2019 4.Followed by adjuvant antiestrogen therapystarted 06/16/2019 ------------------------------------------------------------------------------------------------------------------------------------ 03/08/2019: Bilateral mastectomies: Left mastectomy: Grade 2 IDC spanning 7.5 cm, margins negative, right mastectomy: Benign, ER 95%, PR 5%, HER-2 1+ negative pathology lost the lymph node excision.   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  Scattered areas of bronchiectasis in the lungs.  Atherosclerosis and diverticulosis.  No evidence of metastatic disease.  ------------------------------------------------------------------------------------  She is taking anastrozole daily and is tolerating it well.  She undergoes bone density testing every 2 years, most recently 09/2018.  She says she has some bone loss and is taking calcium, vitamin d and doing weight bearing exercises.    She will continue on her current treatment with anastrozole daily  She has no signs of breast cancer recurrence.  We will do her survivorship care plan visit in 2 months.

## 2020-01-27 NOTE — Progress Notes (Signed)
[facility] Follow up:    Katie Sanders, MD [place] [address]   DIAGNOSIS: Cancer Staging Malignant neoplasm of upper-outer quadrant of left breast in female, estrogen receptor positive ([place]) Staging form: Breast, AJCC 8th Edition - Clinical stage from 09/16/2018: Stage IIIB (cT4, cN0, cM0, G3, ER+, PR+, HER2+) - Signed by [name], MD on 09/16/2018 - Pathologic stage from 03/08/2019: No Stage Recommended (ypT3, pNX, cM0, G2, ER+, PR+, HER2-) - Signed by [name], MD on 04/21/2019   SUMMARY OF ONCOLOGIC HISTORY: Oncology History  Malignant neoplasm of upper-outer quadrant of left breast in female, estrogen receptor positive ([place])  09/14/2018 Initial Diagnosis   With prior history of breast implants that were ruptured/replaced, patient had cellulitis LIQ left breast for 1 week, and UOQ asymmetry 1.5 cm, MRI right breast biopsy benign, left breast numerous masses 7 cm skin thickening: 2 biopsies from left breast: Grade 2-3 IDC ER PR positive, HER-2 positive, Ki-67 50%   09/16/2018 Cancer Staging   Staging form: Breast, AJCC 8th Edition - Clinical stage from 09/16/2018: Stage IIIB (cT4, cN0, cM0, G3, ER+, PR+, HER2+) - Signed by [name], MD on 09/16/2018   09/29/2018 -  Neo-Adjuvant Chemotherapy   TCHP q 3 weeks   01/19/2019 Breast MRI   Significant interval improvement, the multiple left breast masses and areas of non mass enhancement in all 4 quadrants have decreased in size and extent. The mass previously measuring 1.4 cm with diffuse enhancement now measures 9 mm with heterogeneous areas of enhancement. Current greatest transverse dimension is 5.4 x 3.9 cm, previously 7 cm x 5cm. 2. 4 mm masslike focus of enhancement at the base of the right nipple is unchanged.   02/10/2019 - 03/23/2019 Chemotherapy   The patient had trastuzumab (HERCEPTIN) 450 mg in sodium chloride 0.9 % 250 mL chemo infusion, 420 mg (100 % of original dose 6  mg/kg), Intravenous,  Once, 2 of 5 cycles Dose modification: 6 mg/kg (original dose 6 mg/kg, Cycle 1, Reason: Provider Judgment) Administration: 450 mg (02/10/2019), 450 mg (03/03/2019)  for chemotherapy treatment.    03/08/2019 Surgery   Bilateral mastectomies [name]: right breast: no malignancy; left breast: IDC, grade 2, scattered over 7.5cm, clear margins.    03/08/2019 Cancer Staging   Staging form: Breast, AJCC 8th Edition - Pathologic stage from 03/08/2019: No Stage Recommended (ypT3, pNX, cM0, G2, ER+, PR+, HER2-) - Signed by [name], MD on 04/21/2019   03/24/2019 - 07/27/2019 Chemotherapy   The patient had ado-trastuzumab emtansine (KADCYLA) 260 mg in sodium chloride 0.9 % 250 mL chemo infusion, 3.6 mg/kg = 260 mg, Intravenous, Once, 6 of 10 cycles Dose modification: 3 mg/kg (original dose 3.6 mg/kg, Cycle 5, Reason: Dose not tolerated), 2.4 mg/kg (original dose 3.6 mg/kg, Cycle 6, Reason: Provider Judgment) Administration: 260 mg (03/24/2019), 260 mg (04/14/2019), 200 mg (06/16/2019), 260 mg (05/05/2019), 200 mg (05/26/2019), 160 mg (07/07/2019)  for chemotherapy treatment.    05/04/2019 - 06/14/2019 Radiation Therapy   Adjuvant radiation therapy   06/2019 -  Anti-estrogen oral therapy   Anastrozole daily   09/29/2019 -  Chemotherapy   The patient had trastuzumab-dkst (OGIVRI) 546 mg in sodium chloride 0.9 % 250 mL chemo infusion, 8 mg/kg = 546 mg, Intravenous,  Once, 4 of 4 cycles Administration: 546 mg (09/29/2019), 399 mg (10/20/2019), 399 mg (11/10/2019), 399 mg (12/01/2019)  for chemotherapy treatment.      CURRENT THERAPY: Anastrozole  INTERVAL HISTORY: Katie Zahn  [name] 74 y.o. female returns for evaluation of her h/o breast cancer.  She underwent CT chest abdomen and pelvis.  She was recommended to f/u with pulmonology and cardiology which she did.  She is very happy to have had her port removed.  She is exercising 2-3 times per week.  She is getting about 5,000 steps per day.   She is doing 30 minutes of exercise every day.    Patient Active Problem List   Diagnosis Date Noted  . Bronchiectasis without complication ([place]) 54/65/0354  . Aortic atherosclerosis ([place]) 12/23/2019  . Atherosclerosis of coronary artery of native heart without angina pectoris 12/23/2019  . Peripheral edema 12/22/2019  . Neuropathy due to chemotherapeutic drug ([place]) 10/29/2019  . Breast cancer, left breast ([place]) 03/08/2019  . Carotid artery aneurysm ([place]) 01/05/2019  . Urine WBC increased 01/05/2019  . Anemia due to chemotherapy 10/15/2018  . Port-A-Cath in place 09/29/2018  . Diabetic retinopathy ([place]) 09/29/2018  . Genetic testing 09/23/2018  . Family history of leukemia 09/16/2018  . Family history of breast cancer   . Malignant neoplasm of upper-outer quadrant of left breast in female, estrogen receptor positive ([place]) 09/11/2018  . H/O bilateral breast implants 08/28/2018  . Family history of breast cancer in sister 08/28/2018  . Diverticular disease 10/17/2017  . Primary osteoarthritis of right knee 03/07/2017  . 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 Vitamin D deficiency 10/11/2016  . Family history of thyroid disorder 04/04/2015  . 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  . Counseling regarding end of life decision making 10/04/2014  . 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 History of duodenal ulcer 08/02/2014  . Seasonal allergies 08/02/2014  . Essential hypertension, benign 08/02/2014  . High cholesterol 08/02/2014  . 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 History of joint replacement 03/01/2014  . Controlled type 2 diabetes with retinopathy ([place]) 11/07/2009    is allergic to erythromycin; ciprofloxacin; daypro [oxaprozin]; enalapril maleate; nsaids; and vioxx [rofecoxib].  MEDICAL HISTORY: Past Medical History:  Diagnosis Date  . Allergy   . Anemia   . 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 Blood transfusion without  reported diagnosis   . Cancer ([place])    breast 08/2018  . Diverticulitis   . DJD (degenerative joint disease)   . DM type 2 (diabetes mellitus, type 2) ([place])   . Family history of breast cancer   . Fibrocystic breast disease   . GERD (gastroesophageal reflux disease)   . History of chicken pox   . Hypercholesteremia   . Hypertension   . Lichen planopilaris   . Urinary incontinence     SURGICAL HISTORY: Past Surgical History:  Procedure Laterality Date  . BREAST IMPLANT REMOVAL Bilateral 03/08/2019   Procedure: Removal of Bilateral Breast Implants;  Surgeon: Rolm Bookbinder, MD;  Location: [facility];  Service: General;  Laterality: Bilateral;  . BREAST SURGERY  02/1975   Left Breast Biopsy-Fibrocystic Disease  . BREAST SURGERY  10/1981   Bialteral Capsulectomy Breast Surgery  . BREAST SURGERY  07/1998   Replace Bilateral Silicone Implants  . BREAST SURGERY  01/1976   Bilater breast surgery to remove fibrocystic tissue and replace with silicone implants  . CHOLECYSTECTOMY N/A 07/08/2016   Procedure: LAPAROSCOPIC CHOLECYSTECTOMY;  Surgeon: [name], MD;  Location: [facility];  Service: General;  Laterality: N/A;  . COLONOSCOPY WITH PROPOFOL N/A 11/07/2017   Procedure: COLONOSCOPY WITH PROPOFOL;  Surgeon: Jonathon Bellows, MD;  Location: [facility] ENDOSCOPY;  Service: Gastroenterology;  Laterality: N/A;  . DILATION AND CURETTAGE OF UTERUS  01/1994  . ESOPHAGOGASTRODUODENOSCOPY (EGD) WITH PROPOFOL N/A 12/05/2016   Procedure: ESOPHAGOGASTRODUODENOSCOPY (EGD) WITH PROPOFOL;  Surgeon: Jonathon Bellows, MD;  Location: [facility] ENDOSCOPY;  Service: Endoscopy;  Laterality: N/A;  . FINGER SURGERY  2001 & 2003   Trigger Finger  . FOOT SURGERY  1980   To Relieve Pinched Nerve  . FOOT SURGERY  07/2008   Left Foot-Plantar Fasciitis  . JOINT REPLACEMENT  04/19/2013   Hip Replacement-Right  . MASTECTOMY W/ SENTINEL NODE BIOPSY Bilateral 03/08/2019   Procedure: BILATERAL MASTECTOMIES WITH LEFT AXILLARY SENTINEL  LYMPH NODE BIOPSY AND BLUE DYE INJECTION;  Surgeon: Rolm Bookbinder, MD;  Location: [facility];  Service: General;  Laterality: Bilateral;  . PARTIAL HIP ARTHROPLASTY Right   . PLACEMENT OF BREAST IMPLANTS  12/26/2004   Replace Failed Implants  . REPLACEMENT TOTAL KNEE Left    2019-  . RHINOPLASTY  03/1964   To Correct Deviated Septum    SOCIAL HISTORY: Social History   Socioeconomic History  . Marital status: Married    Spouse name: Not on file  . Number of children: 1  . Years of education: Not on file  . Highest education level: Not on file  Occupational History  . Occupation: Retired  Tobacco Use  . Smoking status: Never Smoker  . Smokeless tobacco: Never Used  Substance and Sexual Activity  . Alcohol use: Not Currently    Alcohol/week: 0.0 standard drinks    Comment: rarely  . Drug use: No  . Sexual activity: Not Currently    Partners: Male  Other Topics Concern  . Not on file  Social History Narrative   Married for 37 years.    She has one child and one stepston.      Social Determinants of Health   Financial Resource Strain:   . Difficulty of Paying Living Expenses:   Food Insecurity:   . Worried About Charity fundraiser in the Last Year:   . Arboriculturist in the Last Year:   Transportation Needs: No Transportation Needs  . Lack of Transportation (Medical): No  . Lack of Transportation (Non-Medical): No  Physical Activity:   . Days of Exercise per Week:   . Minutes of Exercise per Session:   Stress:   . Feeling of Stress :   Social Connections:   . Frequency of Communication with Friends and Family:   . Frequency of Social Gatherings with Friends and Family:   . Attends Religious Services:   . Active Member of Clubs or Organizations:   . Attends Archivist Meetings:   [name] Marital Status:   Intimate Partner Violence: Not At Risk  . Fear of Current or Ex-Partner: No  . Emotionally Abused: No  . Physically Abused: No  . Sexually Abused: No     FAMILY HISTORY: Family History  Problem Relation Age of Onset  . Arthritis Mother   . Hypertension Mother   . Heart disease Father   . Diabetes Father   . Heart attack Father   . Breast cancer Sister 32       d. 73  . Hypertension Brother   . Leukemia Brother   . Coronary artery disease Brother   . Heart attack Brother     Review of Systems  Constitutional: Negative for appetite change, chills, fatigue and unexpected weight change.  HENT:   Negative for hearing loss, lump/mass and trouble swallowing.  Eyes: Negative for eye problems and icterus.  Respiratory: Negative for chest tightness, cough and shortness of breath.   Cardiovascular: Negative for chest pain, leg swelling and palpitations.  Gastrointestinal: Negative for abdominal distention, abdominal pain, constipation, diarrhea, nausea and vomiting.  Endocrine: Positive for hot flashes (mild from anastrozole).  Genitourinary: Negative for difficulty urinating.   Musculoskeletal: Negative for arthralgias.  Skin: Negative for itching and rash.  Neurological: Negative for dizziness, extremity weakness, headaches and numbness.  Hematological: Negative for adenopathy. Does not bruise/bleed easily.  Psychiatric/Behavioral: Negative for depression. The patient is not nervous/anxious.       PHYSICAL EXAMINATION  ECOG PERFORMANCE STATUS: 1 - Symptomatic but completely ambulatory  Vitals:   01/27/20 1446  BP: (!) 131/53  Pulse: 61  Resp: 16  Temp: 98.3 F (36.8 C)  SpO2: 100%    Physical Exam Constitutional:      General: She is not in acute distress.    Appearance: Normal appearance. She is not toxic-appearing.  HENT:     Head: Normocephalic and atraumatic.  Eyes:     General: No scleral icterus.    Pupils: Pupils are equal, round, and reactive to light.  Cardiovascular:     Rate and Rhythm: Normal rate and regular rhythm.     Pulses: Normal pulses.     Heart sounds: Normal heart sounds.  Pulmonary:      Effort: Pulmonary effort is normal.     Breath sounds: Normal breath sounds.     Comments: S/p bilateral mastectomies, no sign of local recurrence  Abdominal:     General: Abdomen is flat. Bowel sounds are normal.     Palpations: Abdomen is soft.  Musculoskeletal:        General: No swelling.     Cervical back: Neck supple.  Lymphadenopathy:     Cervical: No cervical adenopathy.  Skin:    General: Skin is warm and dry.     Findings: No rash.  Neurological:     General: No focal deficit present.     Mental Status: She is alert.  Psychiatric:        Mood and Affect: Mood normal.        Behavior: Behavior normal.     LABORATORY DATA:  CBC    Component Value Date/Time   WBC 5.1 01/27/2020 1431   WBC 4.7 03/09/2019 0231   RBC 3.31 (L) 01/27/2020 1431   HGB 11.9 (L) 01/27/2020 1431   HGB 10.0 (L) 04/29/2013 0812   HCT 34.3 (L) 01/27/2020 1431   HCT 29.1 (L) 04/29/2013 0812   PLT 130 (L) 01/27/2020 1431   PLT 378 04/29/2013 0812   MCV 103.6 (H) 01/27/2020 1431   MCV 95 04/29/2013 0812   MCH 36.0 (H) 01/27/2020 1431   MCHC 34.7 01/27/2020 1431   RDW 14.1 01/27/2020 1431   RDW 15.5 (H) 04/29/2013 0812   LYMPHSABS 0.9 01/27/2020 1431   LYMPHSABS 1.4 04/29/2013 0812   MONOABS 0.5 01/27/2020 1431   MONOABS 0.7 04/29/2013 0812   EOSABS 0.2 01/27/2020 1431   EOSABS 0.4 04/29/2013 0812   BASOSABS 0.0 01/27/2020 1431   BASOSABS 0.1 04/29/2013 0812    CMP     Component Value Date/Time   NA 135 01/27/2020 1431   K 4.1 01/27/2020 1431   CL 97 (L) 01/27/2020 1431   CO2 27 01/27/2020 1431   GLUCOSE 150 (H) 01/27/2020 1431   BUN 19 01/27/2020 1431   CREATININE 0.99 01/27/2020 1431   CALCIUM 9.6  01/27/2020 1431   PROT 7.2 01/27/2020 1431   ALBUMIN 4.1 01/27/2020 1431   AST 25 01/27/2020 1431   ALT 26 01/27/2020 1431   ALKPHOS 95 01/27/2020 1431   BILITOT 0.7 01/27/2020 1431   GFRNONAA 56 (L) 01/27/2020 1431   GFRAA >60 01/27/2020 1431      ASSESSMENT and PLAN:    Malignant neoplasm of upper-outer quadrant of left breast in female, estrogen receptor positive ([place]) With prior history of breast implants that were ruptured/replaced, patient had cellulitis LIQ left breast for 1 week, and UOQ asymmetry 1.5 cm, MRI right breast biopsy benign, left breast numerous masses 7 cm skin thickening: 2 biopsies from left breast: Grade 2-3 IDC ER PR positive, HER-2 positive, Ki-67 50% Clinical stage: T4 N0 M0 stage IIIb  Treatment plan 1. Neoadjuvant chemotherapy with [place] Perjeta 6 cyclescompleted 5/6/2020followed by Herceptin maintenance for 1 year completed 12/01/2019 2. Followed byBilmastectomieswith sentinel lymph node study 03/08/2019 3. Followed by adjuvant radiation therapystarted 05/04/2019-06/14/2019 4.Followed by adjuvant antiestrogen therapystarted 06/16/2019 ------------------------------------------------------------------------------------------------------------------------------------ 03/08/2019: Bilateral mastectomies: Left mastectomy: Grade 2 IDC spanning 7.5 cm, margins negative, right mastectomy: Benign, ER 95%, PR 5%, HER-2 1+ negative pathology lost the lymph node excision.   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  Scattered areas of bronchiectasis in the lungs.  Atherosclerosis and diverticulosis.  No evidence of metastatic disease.  ------------------------------------------------------------------------------------  She is taking anastrozole daily and is tolerating it well.  She undergoes bone density testing every 2 years, most recently 09/2018.  She says she has some bone loss and is taking calcium, vitamin d and doing weight bearing exercises.    She will continue on her current treatment with anastrozole daily  She has no signs of breast cancer recurrence.  We will do her survivorship care plan visit in 2 months.    All questions  were answered. The patient knows to call the clinic with any problems, questions or concerns. We can certainly see the patient much sooner if necessary.  Total encounter time: 20 minutes*   Wilber Bihari, NP 01/27/20 3:25 PM Medical Oncology and Hematology [facility] [employer], [place] [address] Tel. [phone]    Fax. [phone]  *Total Encounter Time as defined by the Centers for Medicare and Medicaid Services includes, in addition to the face-to-face time of a patient visit (documented in the note above) non-face-to-face time: obtaining and reviewing outside history, ordering and reviewing medications, tests or procedures, care coordination (communications with other health care professionals or caregivers) and documentation in the medical record.

## 2020-01-28 ENCOUNTER — Telehealth: Payer: Self-pay | Admitting: Adult Health

## 2020-01-28 NOTE — Telephone Encounter (Signed)
Scheduled appts per 6/3 los. Pt confirmed appt date and time.

## 2020-02-01 ENCOUNTER — Telehealth: Payer: Self-pay | Admitting: Hematology and Oncology

## 2020-02-01 ENCOUNTER — Encounter: Payer: Self-pay | Admitting: Ophthalmology

## 2020-02-01 ENCOUNTER — Other Ambulatory Visit: Payer: Self-pay

## 2020-02-01 NOTE — Telephone Encounter (Signed)
Scheduled appts per 6/5 sch msg. Pt confirmed appt date and time.

## 2020-02-04 ENCOUNTER — Other Ambulatory Visit: Payer: Self-pay

## 2020-02-04 ENCOUNTER — Other Ambulatory Visit
Admission: RE | Admit: 2020-02-04 | Discharge: 2020-02-04 | Disposition: A | Discharge status: Home / Self Care | Payer: Medicare Other | Source: Ambulatory Visit | Attending: Ophthalmology | Admitting: Ophthalmology

## 2020-02-04 DIAGNOSIS — Z20822 Contact with and (suspected) exposure to covid-19: Secondary | ICD-10-CM | POA: Diagnosis not present

## 2020-02-04 DIAGNOSIS — Z01812 Encounter for preprocedural laboratory examination: Secondary | ICD-10-CM | POA: Insufficient documentation

## 2020-02-04 NOTE — Discharge Instructions (Signed)
General Anesthesia, Adult, Care After This sheet gives you information about how to care for yourself after your procedure. Your health care provider may also give you more specific instructions. If you have problems or questions, contact your health care provider. What can I expect after the procedure? After the procedure, the following side effects are common:  Pain or discomfort at the IV site.  Nausea.  Vomiting.  Sore throat.  Trouble concentrating.  Feeling cold or chills.  Weak or tired.  Sleepiness and fatigue.  Soreness and body aches. These side effects can affect parts of the body that were not involved in surgery. Follow these instructions at home:  For at least 24 hours after the procedure:  Have a responsible adult stay with you. It is important to have someone help care for you until you are awake and alert.  Rest as needed.  Do not: ? Participate in activities in which you could fall or become injured. ? Drive. ? Use heavy machinery. ? Drink alcohol. ? Take sleeping pills or medicines that cause drowsiness. ? Make important decisions or sign legal documents. ? Take care of children on your own. Eating and drinking  Follow any instructions from your health care provider about eating or drinking restrictions.  When you feel hungry, start by eating small amounts of foods that are soft and easy to digest (bland), such as toast. Gradually return to your regular diet.  Drink enough fluid to keep your urine pale yellow.  If you vomit, rehydrate by drinking water, juice, or clear broth. General instructions  If you have sleep apnea, surgery and certain medicines can increase your risk for breathing problems. Follow instructions from your health care provider about wearing your sleep device: ? Anytime you are sleeping, including during daytime naps. ? While taking prescription pain medicines, sleeping medicines, or medicines that make you drowsy.  Return to  your normal activities as told by your health care provider. Ask your health care provider what activities are safe for you.  Take over-the-counter and prescription medicines only as told by your health care provider.  If you smoke, do not smoke without supervision.  Keep all follow-up visits as told by your health care provider. This is important. Contact a health care provider if:  You have nausea or vomiting that does not get better with medicine.  You cannot eat or drink without vomiting.  You have pain that does not get better with medicine.  You are unable to pass urine.  You develop a skin rash.  You have a fever.  You have redness around your IV site that gets worse. Get help right away if:  You have difficulty breathing.  You have chest pain.  You have blood in your urine or stool, or you vomit blood. Summary  After the procedure, it is common to have a sore throat or nausea. It is also common to feel tired.  Have a responsible adult stay with you for the first 24 hours after general anesthesia. It is important to have someone help care for you until you are awake and alert.  When you feel hungry, start by eating small amounts of foods that are soft and easy to digest (bland), such as toast. Gradually return to your regular diet.  Drink enough fluid to keep your urine pale yellow.  Return to your normal activities as told by your health care provider. Ask your health care provider what activities are safe for you. This information is not   intended to replace advice given to you by your health care provider. Make sure you discuss any questions you have with your health care provider. Document Revised: 08/15/2017 Document Reviewed: 03/28/2017 Elsevier Patient Education  2020 Elsevier Inc.  Cataract Surgery, Care After This sheet gives you information about how to care for yourself after your procedure. Your health care provider may also give you more specific  instructions. If you have problems or questions, contact your health care provider. What can I expect after the procedure? After the procedure, it is common to have:  Itching.  Discomfort.  Fluid discharge.  Sensitivity to light and to touch.  Bruising in or around the eye.  Mild blurred vision. Follow these instructions at home: Eye care   Do not touch or rub your eyes.  Protect your eyes as told by your health care provider. You may be told to wear a protective eye shield or sunglasses.  Do not put a contact lens into the affected eye or eyes until your health care provider approves.  Keep the area around your eye clean and dry: ? Avoid swimming. ? Do not allow water to hit you directly in the face while showering. ? Keep soap and shampoo out of your eyes.  Check your eye every day for signs of infection. Watch for: ? Redness, swelling, or pain. ? Fluid, blood, or pus. ? Warmth. ? A bad smell. ? Vision that is getting worse. ? Sensitivity that is getting worse. Activity  Do not drive for 24 hours if you were given a sedative during your procedure.  Avoid strenuous activities, such as playing contact sports, for as long as told by your health care provider.  Do not drive or use heavy machinery until your health care provider approves.  Do not bend or lift heavy objects. Bending increases pressure in the eye. You can walk, climb stairs, and do light household chores.  Ask your health care provider when you can return to work. If you work in a dusty environment, you may be advised to wear protective eyewear for a period of time. General instructions  Take or apply over-the-counter and prescription medicines only as told by your health care provider. This includes eye drops.  Keep all follow-up visits as told by your health care provider. This is important. Contact a health care provider if:  You have increased bruising around your eye.  You have pain that is  not helped with medicine.  You have a fever.  You have redness, swelling, or pain in your eye.  You have fluid, blood, or pus coming from your incision.  Your vision gets worse.  Your sensitivity to light gets worse. Get help right away if:  You have sudden loss of vision.  You see flashes of light or spots (floaters).  You have severe eye pain.  You develop nausea or vomiting. Summary  After your procedure, it is common to have itching, discomfort, bruising, fluid discharge, or sensitivity to light.  Follow instructions from your health care provider about caring for your eye after the procedure.  Do not rub your eye after the procedure. You may need to wear eye protection or sunglasses. Do not wear contact lenses. Keep the area around your eye clean and dry.  Avoid activities that require a lot of effort. These include playing sports and lifting heavy objects.  Contact a health care provider if you have increased bruising, pain that does not go away, or a fever. Get help right   away if you suddenly lose your vision, see flashes of light or spots, or have severe pain in the eye. This information is not intended to replace advice given to you by your health care provider. Make sure you discuss any questions you have with your health care provider. Document Revised: 06/08/2019 Document Reviewed: 02/09/2018 Elsevier Patient Education  2020 Elsevier Inc.  

## 2020-02-05 LAB — SARS CORONAVIRUS 2 (TAT 6-24 HRS): SARS Coronavirus 2: NEGATIVE

## 2020-02-07 ENCOUNTER — Ambulatory Visit: Payer: Medicare Other

## 2020-02-08 ENCOUNTER — Encounter: Payer: Self-pay | Admitting: Ophthalmology

## 2020-02-08 ENCOUNTER — Ambulatory Visit
Admission: RE | Admit: 2020-02-08 | Discharge: 2020-02-08 | Disposition: A | Discharge status: Home / Self Care | Payer: Medicare Other | Attending: Ophthalmology | Admitting: Ophthalmology

## 2020-02-08 ENCOUNTER — Encounter
Admission: RE | Disposition: A | Discharge status: Home / Self Care | Payer: Self-pay | Source: Home / Self Care | Attending: Ophthalmology

## 2020-02-08 ENCOUNTER — Ambulatory Visit: Payer: Medicare Other | Admitting: Anesthesiology

## 2020-02-08 ENCOUNTER — Other Ambulatory Visit: Payer: Self-pay

## 2020-02-08 DIAGNOSIS — Z7982 Long term (current) use of aspirin: Secondary | ICD-10-CM | POA: Insufficient documentation

## 2020-02-08 DIAGNOSIS — E785 Hyperlipidemia, unspecified: Secondary | ICD-10-CM | POA: Insufficient documentation

## 2020-02-08 DIAGNOSIS — I10 Essential (primary) hypertension: Secondary | ICD-10-CM | POA: Insufficient documentation

## 2020-02-08 DIAGNOSIS — T451X5A Adverse effect of antineoplastic and immunosuppressive drugs, initial encounter: Secondary | ICD-10-CM | POA: Diagnosis not present

## 2020-02-08 DIAGNOSIS — G622 Polyneuropathy due to other toxic agents: Secondary | ICD-10-CM | POA: Insufficient documentation

## 2020-02-08 DIAGNOSIS — C50912 Malignant neoplasm of unspecified site of left female breast: Secondary | ICD-10-CM | POA: Insufficient documentation

## 2020-02-08 DIAGNOSIS — E7800 Pure hypercholesterolemia, unspecified: Secondary | ICD-10-CM | POA: Diagnosis not present

## 2020-02-08 DIAGNOSIS — Z79811 Long term (current) use of aromatase inhibitors: Secondary | ICD-10-CM | POA: Diagnosis not present

## 2020-02-08 DIAGNOSIS — Z79899 Other long term (current) drug therapy: Secondary | ICD-10-CM | POA: Diagnosis not present

## 2020-02-08 DIAGNOSIS — Z7984 Long term (current) use of oral hypoglycemic drugs: Secondary | ICD-10-CM | POA: Diagnosis not present

## 2020-02-08 DIAGNOSIS — H2511 Age-related nuclear cataract, right eye: Secondary | ICD-10-CM | POA: Insufficient documentation

## 2020-02-08 DIAGNOSIS — E1136 Type 2 diabetes mellitus with diabetic cataract: Secondary | ICD-10-CM | POA: Diagnosis not present

## 2020-02-08 DIAGNOSIS — I671 Cerebral aneurysm, nonruptured: Secondary | ICD-10-CM | POA: Insufficient documentation

## 2020-02-08 DIAGNOSIS — I2510 Atherosclerotic heart disease of native coronary artery without angina pectoris: Secondary | ICD-10-CM | POA: Diagnosis not present

## 2020-02-08 HISTORY — PX: CATARACT EXTRACTION W/PHACO: SHX586

## 2020-02-08 HISTORY — DX: Atherosclerotic heart disease of native coronary artery without angina pectoris: I25.10

## 2020-02-08 LAB — GLUCOSE, CAPILLARY
Glucose-Capillary: 117 mg/dL — ABNORMAL HIGH (ref 70–99)
Glucose-Capillary: 116 mg/dL — ABNORMAL HIGH (ref 70–99)

## 2020-02-08 SURGERY — PHACOEMULSIFICATION, CATARACT, WITH IOL INSERTION
Anesthesia: Monitor Anesthesia Care | Site: Eye | Laterality: Right

## 2020-02-08 MED ORDER — TETRACAINE HCL 0.5 % OP SOLN
1.0000 [drp] | OPHTHALMIC | Status: DC | PRN
  Administered 2020-02-08 (×3): 1 [drp] via OPHTHALMIC

## 2020-02-08 MED ORDER — LACTATED RINGERS IV SOLN: 100.0000 mL/h | INTRAVENOUS | Status: DC

## 2020-02-08 MED ORDER — FENTANYL CITRATE (PF) 100 MCG/2ML IJ SOLN
INTRAMUSCULAR | Status: DC | PRN
  Administered 2020-02-08 (×2): 50 ug via INTRAVENOUS

## 2020-02-08 MED ORDER — ACETAMINOPHEN 10 MG/ML IV SOLN: 1000.0000 mg | Freq: Once | INTRAVENOUS | Status: DC | PRN

## 2020-02-08 MED ORDER — ARMC OPHTHALMIC DILATING DROPS
1.0000 "application " | OPHTHALMIC | Status: DC | PRN
  Administered 2020-02-08 (×3): 1 via OPHTHALMIC

## 2020-02-08 MED ORDER — NA CHONDROIT SULF-NA HYALURON 40-17 MG/ML IO SOLN
INTRAOCULAR | Status: DC | PRN
  Administered 2020-02-08: 1 mL via INTRAOCULAR

## 2020-02-08 MED ORDER — LIDOCAINE HCL (PF) 2 % IJ SOLN
INTRAOCULAR | Status: DC | PRN
  Administered 2020-02-08: 2 mL

## 2020-02-08 MED ORDER — ONDANSETRON HCL 4 MG/2ML IJ SOLN: 4.0000 mg | Freq: Once | INTRAMUSCULAR | Status: DC | PRN

## 2020-02-08 MED ORDER — MOXIFLOXACIN HCL 0.5 % OP SOLN
OPHTHALMIC | Status: DC | PRN
  Administered 2020-02-08: 0.2 mL via OPHTHALMIC

## 2020-02-08 MED ORDER — EPINEPHRINE PF 1 MG/ML IJ SOLN
INTRAOCULAR | Status: DC | PRN
  Administered 2020-02-08: 45 mL via OPHTHALMIC

## 2020-02-08 MED ORDER — MIDAZOLAM HCL 2 MG/2ML IJ SOLN
INTRAMUSCULAR | Status: DC | PRN
  Administered 2020-02-08 (×2): 1 mg via INTRAVENOUS

## 2020-02-08 MED ORDER — BRIMONIDINE TARTRATE-TIMOLOL 0.2-0.5 % OP SOLN
OPHTHALMIC | Status: DC | PRN
  Administered 2020-02-08: 1 [drp] via OPHTHALMIC

## 2020-02-08 SURGICAL SUPPLY — 18 items
CANNULA ANT/CHMB 27GA (MISCELLANEOUS) ×6 IMPLANT
GLOVE SURG LX 8.0 MICRO (GLOVE) ×2
GLOVE SURG LX STRL 8.0 MICRO (GLOVE) ×1 IMPLANT
GLOVE SURG TRIUMPH 8.0 PF LTX (GLOVE) ×3 IMPLANT
GOWN STRL REUS W/ TWL LRG LVL3 (GOWN DISPOSABLE) ×2 IMPLANT
GOWN STRL REUS W/TWL LRG LVL3 (GOWN DISPOSABLE) ×6
LENS IOL DIOP 21.0 (Intraocular Lens) ×3 IMPLANT
LENS IOL TECNIS MONO 21.0 (Intraocular Lens) ×1 IMPLANT
MARKER SKIN DUAL TIP RULER LAB (MISCELLANEOUS) ×3 IMPLANT
NEEDLE FILTER BLUNT 18X 1/2SAF (NEEDLE) ×2
NEEDLE FILTER BLUNT 18X1 1/2 (NEEDLE) ×1 IMPLANT
PACK EYE AFTER SURG (MISCELLANEOUS) ×3 IMPLANT
PACK OPTHALMIC (MISCELLANEOUS) ×3 IMPLANT
PACK PORFILIO (MISCELLANEOUS) ×3 IMPLANT
SYR 3ML LL SCALE MARK (SYRINGE) ×3 IMPLANT
SYR TB 1ML LUER SLIP (SYRINGE) ×3 IMPLANT
WATER STERILE IRR 250ML POUR (IV SOLUTION) ×3 IMPLANT
WIPE NON LINTING 3.25X3.25 (MISCELLANEOUS) ×3 IMPLANT

## 2020-02-08 NOTE — Op Note (Signed)
PREOPERATIVE DIAGNOSIS:  Nuclear sclerotic cataract of the right eye.   POSTOPERATIVE DIAGNOSIS:  H25.11 Cataract   OPERATIVE PROCEDURE:@   SURGEON:  Birder [name], MD.   ANESTHESIA:  Anesthesiologist: Heniser, Fredric Dine, MD  1.      Managed anesthesia care. 2.      0.59ml of Shugarcaine was instilled in the eye following the paracentesis.   COMPLICATIONS:  None.   TECHNIQUE:   Stop and chop   DESCRIPTION OF PROCEDURE:  The patient was examined and consented in the preoperative holding area where the aforementioned topical anesthesia was applied to the right eye and then brought back to the Operating Room where the right eye was prepped and draped in the usual sterile ophthalmic fashion and a lid speculum was placed. A paracentesis was created with the side port blade and the anterior chamber was filled with viscoelastic. A near clear corneal incision was performed with the steel keratome. A continuous curvilinear capsulorrhexis was performed with a cystotome followed by the capsulorrhexis forceps. Hydrodissection and hydrodelineation were carried out with BSS on a blunt cannula. The lens was removed in a stop and chop  technique and the remaining cortical material was removed with the irrigation-aspiration handpiece. The capsular bag was inflated with viscoelastic and the Technis ZCB00  lens was placed in the capsular bag without complication. The remaining viscoelastic was removed from the eye with the irrigation-aspiration handpiece. The wounds were hydrated. The anterior chamber was flushed with BSS and the eye was inflated to physiologic pressure. 0.45ml of Vigamox was placed in the anterior chamber. The wounds were found to be water tight. The eye was dressed with Combigan. The patient was given protective glasses to wear throughout the day and a shield with which to sleep tonight. The patient was also given drops with which to begin a drop regimen today and will follow-up with me in one  day. Implant Name Type Inv. Item Serial No. Manufacturer Lot No. LRB No. Used Action  LENS IOL DIOP 21.0 - [id number] Intraocular Lens LENS IOL DIOP 21.0 [id number] AMO  Right 1 Implanted   Procedure(s) with comments: CATARACT EXTRACTION PHACO AND INTRAOCULAR LENS PLACEMENT (IOC) RIGHT DIABETIC (Right) - 3.55 0:33.7  Electronically signed: Birder [name] 02/08/2020 11:01 AM

## 2020-02-08 NOTE — Anesthesia Preprocedure Evaluation (Signed)
Anesthesia Evaluation  Patient identified by MRN, date of birth, ID band Patient awake    Reviewed: Allergy & Precautions, NPO status , Patient's Chart, lab work & pertinent test results, reviewed documented beta blocker date and time   History of Anesthesia Complications Negative for: history of anesthetic complications  Airway Mallampati: III  TM Distance: >3 FB Neck ROM: Full    Dental   Pulmonary    breath sounds clear to auscultation       Cardiovascular hypertension, (-) angina+ CAD  (-) DOE  Rhythm:Regular Rate:Normal   HLD  Echocardiogram (06/22/19 : LVEF 60-65%, Normal RV size and function, mild to mod AV sclerosis. 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)    GI/Hepatic GERD  Controlled, Diverticulosis   Endo/Other  diabetes, Type 2  Renal/GU      Musculoskeletal  (+) Arthritis , Osteoarthritis,    Abdominal   Peds  Hematology  (+) anemia ,   Anesthesia Other Findings L breast cancer  Reproductive/Obstetrics                             Anesthesia Physical  Anesthesia Plan  ASA: III  Anesthesia Plan: MAC   Post-op Pain Management:    Induction: Intravenous  PONV Risk Score and Plan: 2 and TIVA, Midazolam and Treatment may vary due to age or medical condition  Airway Management Planned: Nasal Cannula  Additional Equipment:   Intra-op Plan:   Post-operative Plan:   Informed Consent: I have reviewed the patients History and Physical, chart, labs and discussed the procedure including the risks, benefits and alternatives for the proposed anesthesia with the patient or authorized representative who has indicated his/her understanding and acceptance.       Plan Discussed with: CRNA and Anesthesiologist  Anesthesia Plan Comments:         Anesthesia Quick Evaluation  

## 2020-02-08 NOTE — H&P (Signed)
All labs reviewed. Abnormal studies sent to patients PCP when indicated.  Previous H&P reviewed, patient examined, there are NO CHANGES.  Katie Mcgilvray Porfilio6/15/202110:36 AM

## 2020-02-08 NOTE — Anesthesia Procedure Notes (Signed)
Procedure Name: MAC Date/Time: 02/08/2020 10:46 AM Performed by: Vanetta Shawl, CRNA Pre-anesthesia Checklist: Patient identified, Emergency Drugs available, Suction available, Timeout performed and Patient being monitored Patient Re-evaluated:Patient Re-evaluated prior to induction Oxygen Delivery Method: Nasal cannula Placement Confirmation: positive ETCO2

## 2020-02-08 NOTE — Transfer of Care (Signed)
Immediate Anesthesia Transfer of Care Note  Patient: Katie [name]  Procedure(s) Performed: CATARACT EXTRACTION PHACO AND INTRAOCULAR LENS PLACEMENT (IOC) RIGHT DIABETIC (Right Eye)  Patient Location: PACU  Anesthesia Type: MAC  Level of Consciousness: awake, alert  and patient cooperative  Airway and Oxygen Therapy: Patient Spontanous Breathing   Post-op Assessment: Post-op Vital signs reviewed, Patient's Cardiovascular Status Stable, Respiratory Function Stable, Patent Airway and No signs of Nausea or vomiting  Post-op Vital Signs: Reviewed and stable  Complications: No complications documented.

## 2020-02-08 NOTE — Anesthesia Postprocedure Evaluation (Signed)
Anesthesia Post Note  Patient: Katie [name]  Procedure(s) Performed: CATARACT EXTRACTION PHACO AND INTRAOCULAR LENS PLACEMENT (IOC) RIGHT DIABETIC (Right Eye)     Patient location during evaluation: PACU Anesthesia Type: MAC Level of consciousness: awake and alert Pain management: pain level controlled Vital Signs Assessment: post-procedure vital signs reviewed and stable Respiratory status: spontaneous breathing, nonlabored ventilation, respiratory function stable and patient connected to nasal cannula oxygen Cardiovascular status: stable and blood pressure returned to baseline Postop Assessment: no apparent nausea or vomiting Anesthetic complications: no   No complications documented.  Gregori Abril A  Hanan Moen

## 2020-02-09 ENCOUNTER — Ambulatory Visit: Payer: Medicare Other | Admitting: Adult Health

## 2020-02-09 ENCOUNTER — Other Ambulatory Visit: Payer: Medicare Other

## 2020-02-09 ENCOUNTER — Encounter: Payer: Self-pay | Admitting: Ophthalmology

## 2020-02-14 ENCOUNTER — Telehealth: Payer: Self-pay

## 2020-02-14 NOTE — Telephone Encounter (Signed)
Pt is aware of date/time of covid test. Nothing further is needed.  

## 2020-02-16 ENCOUNTER — Other Ambulatory Visit: Payer: Self-pay

## 2020-02-16 ENCOUNTER — Other Ambulatory Visit
Admission: RE | Admit: 2020-02-16 | Discharge: 2020-02-16 | Disposition: A | Discharge status: Home / Self Care | Payer: Medicare Other | Source: Ambulatory Visit | Attending: Pulmonary Disease | Admitting: Pulmonary Disease

## 2020-02-16 DIAGNOSIS — Z01812 Encounter for preprocedural laboratory examination: Secondary | ICD-10-CM | POA: Insufficient documentation

## 2020-02-16 DIAGNOSIS — Z20822 Contact with and (suspected) exposure to covid-19: Secondary | ICD-10-CM | POA: Insufficient documentation

## 2020-02-16 LAB — SARS CORONAVIRUS 2 (TAT 6-24 HRS): SARS Coronavirus 2: NEGATIVE

## 2020-02-17 ENCOUNTER — Ambulatory Visit: Payer: Medicare Other | Attending: Pulmonary Disease

## 2020-02-17 DIAGNOSIS — J479 Bronchiectasis, uncomplicated: Secondary | ICD-10-CM

## 2020-02-17 MED ORDER — ALBUTEROL SULFATE (2.5 MG/3ML) 0.083% IN NEBU
2.5000 mg | INHALATION_SOLUTION | Freq: Once | RESPIRATORY_TRACT | Status: AC
  Administered 2020-02-17: 2.5 mg via RESPIRATORY_TRACT
  Filled 2020-02-17: qty 3

## 2020-02-19 ENCOUNTER — Other Ambulatory Visit: Payer: Self-pay | Admitting: Family Medicine

## 2020-02-21 ENCOUNTER — Other Ambulatory Visit: Payer: Self-pay

## 2020-02-21 ENCOUNTER — Encounter: Payer: Self-pay | Admitting: Ophthalmology

## 2020-02-24 NOTE — Discharge Instructions (Signed)

## 2020-02-26 IMAGING — MR MRA HEAD WITHOUT CONTRAST
10 of 11 series · 35 of 48 positions shown · non-contrast
Comparison: No prior brain imaging. chest abdominal and pelvis
imaging earlier this year.

CLINICAL DATA: 73-year-old female with ataxia, vertigo. Currently.
Undergoing chemotherapy for breast cancer

EXAM:
MRI HEAD WITHOUT CONTRAST
MRA HEAD WITHOUT CONTRAST
TECHNIQUE: Multiplanar, multiecho pulse sequences of the brain and surrounding
structures were obtained without intravenous contrast. Angiographic
images of the head were obtained using MRA technique without
contrast.

[Series 2: ax dwi_tracew · axial · 3.0mm · 0.68mm/px · z∈[-90,+71]mm · 5 of 55 slices shown]
[im 1/55]
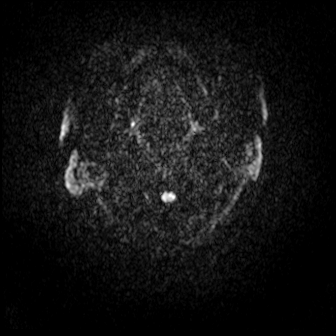
[im 14/55]
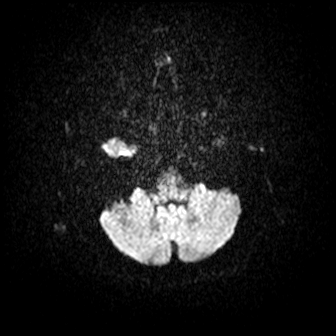
[im 28/55]
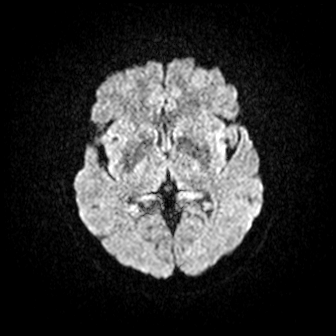
[im 41/55]
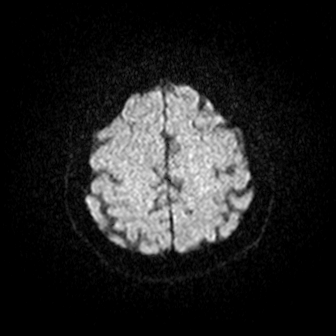
[im 55/55]
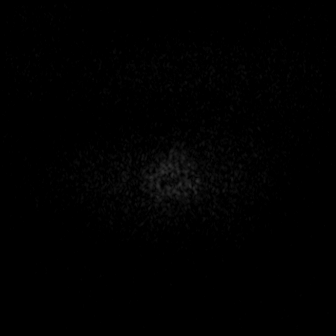

[Series 3: ax dwi_adc · axial · 3.0mm · 0.68mm/px · z∈[-90,+71]mm · 5 of 55 slices shown]
[im 1/55]
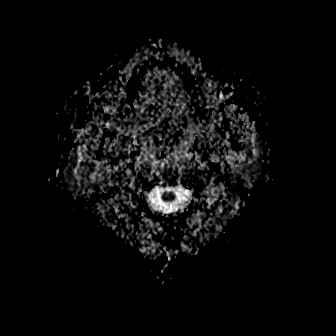
[im 14/55]
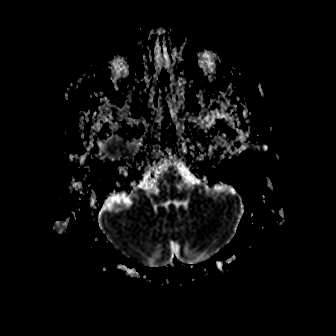
[im 28/55]
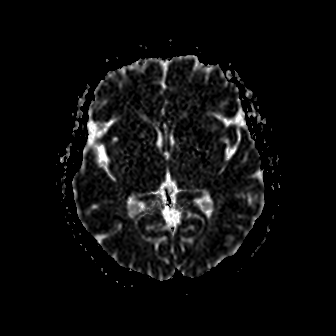
[im 41/55]
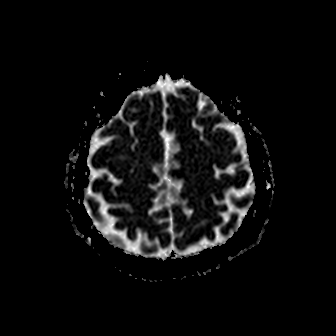
[im 55/55]
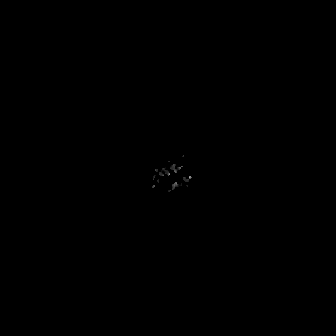

[Series 4: cor dwi_tracew · coronal · 5.0mm · 0.68mm/px · 4 of 44 slices shown]
[im 1/44]
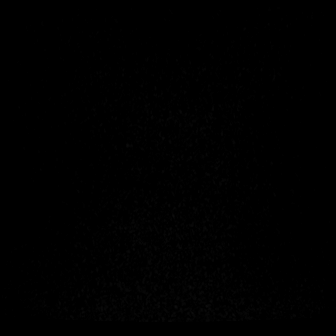
[im 15/44]
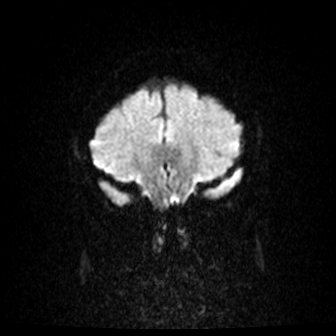
[im 29/44]
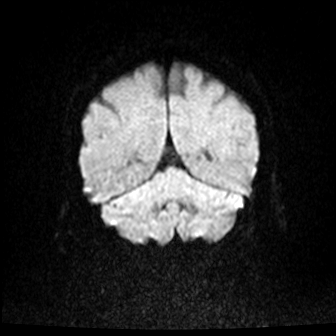
[im 44/44]
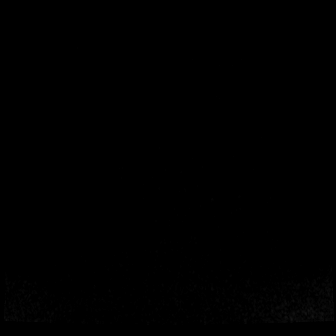

[Series 5: cor dwi_adc · coronal · 5.0mm · 0.68mm/px · 4 of 38 slices shown]
[im 1/38]
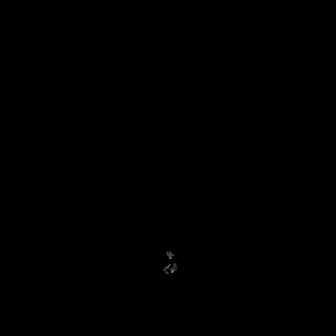
[im 13/38]
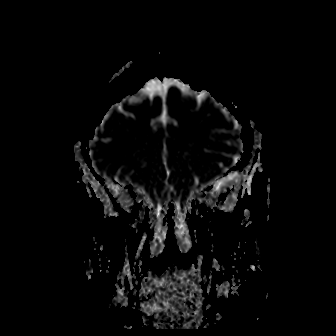
[im 25/38]
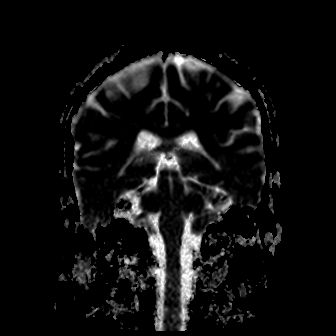
[im 38/38]
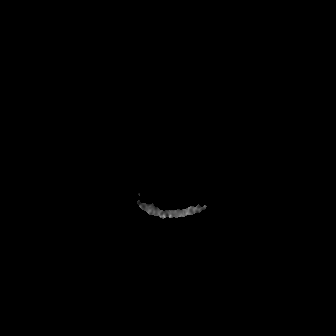

[Series 6: T1 · sagittal · 5.0mm · 0.94mm/px · 2 of 25 slices shown (1 of 2)]
[im 1/25]
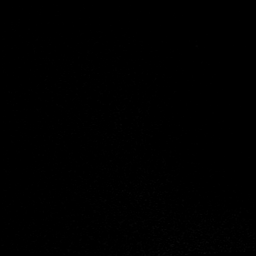
[im 25/25]
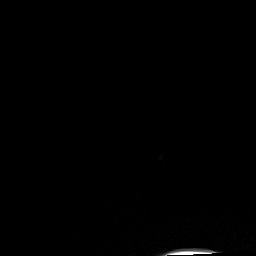

[Series 7: T2 · axial · 5.0mm · 0.45mm/px · z∈[-65,+91]mm · 3 of 27 slices shown (1 of 2)]
[im 1/27]
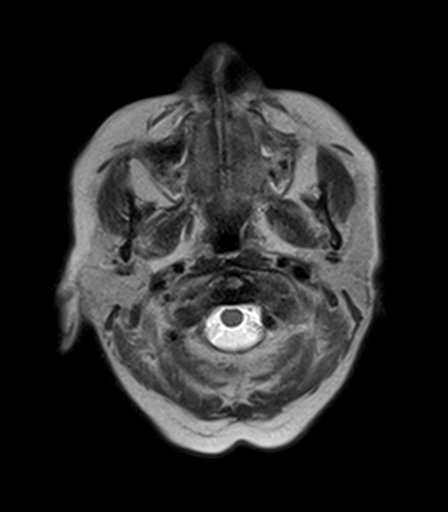
[im 14/27]
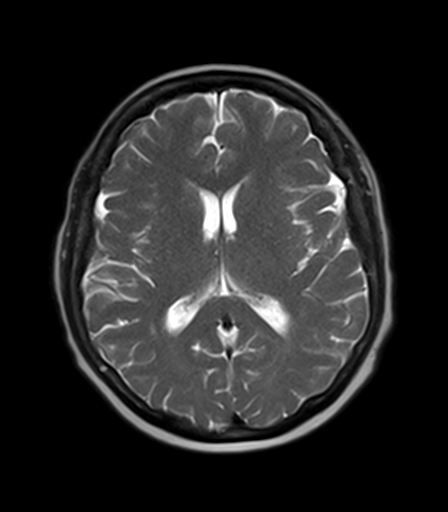
[im 27/27]
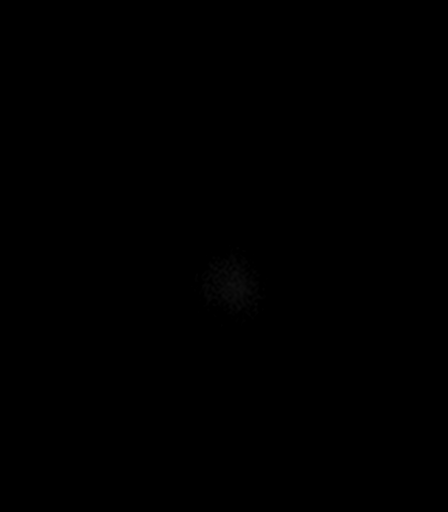

[Series 8: T2-star · axial · 5.0mm · 0.45mm/px · z∈[-65,+91]mm · 3 of 27 slices shown]
[im 1/27]
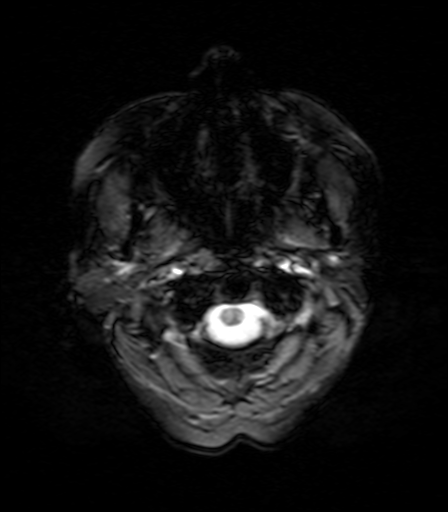
[im 14/27]
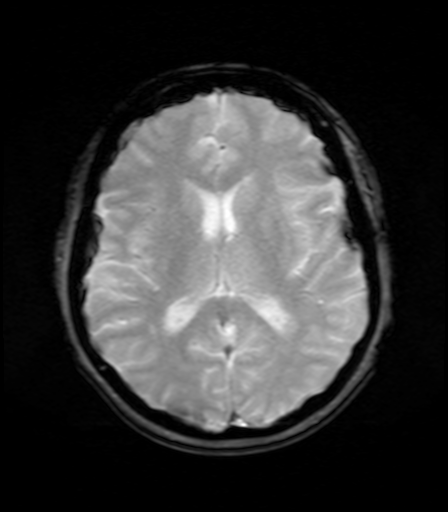
[im 27/27]
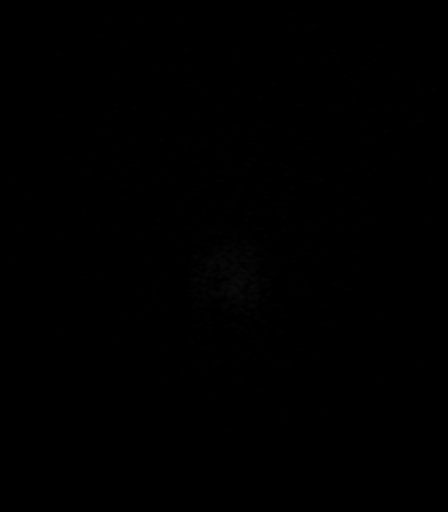

[Series 9: FLAIR · axial · 5.0mm · 1.20mm/px · z∈[-65,+91]mm · 3 of 27 slices shown]
[im 1/27]
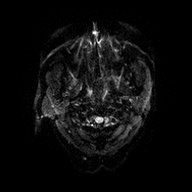
[im 14/27]
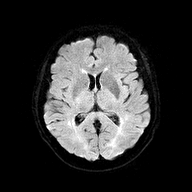
[im 27/27]
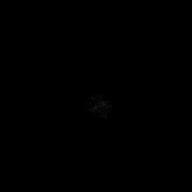

[Series 10: T1 · axial · 5.0mm · 0.90mm/px · z∈[-65,+91]mm · 3 of 27 slices shown (2 of 2)]
[im 1/27]
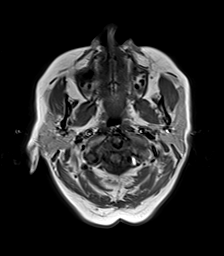
[im 14/27]
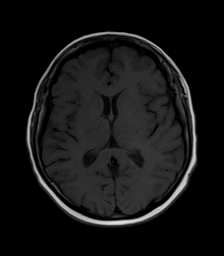
[im 27/27]
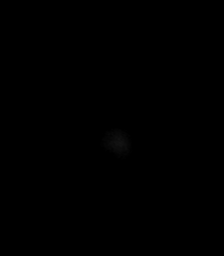

[Series 16: T2 · coronal · 5.0mm · 0.45mm/px · 3 of 31 slices shown (2 of 2)]
[im 1/31]
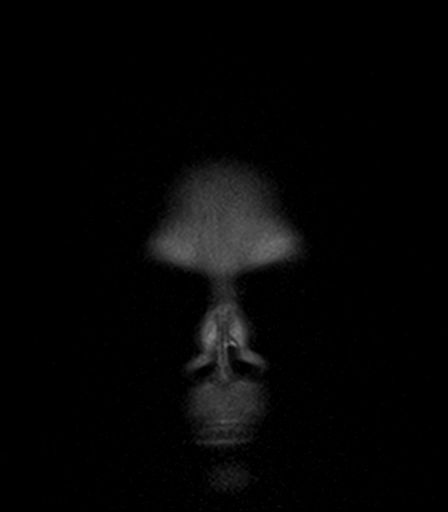
[im 16/31]
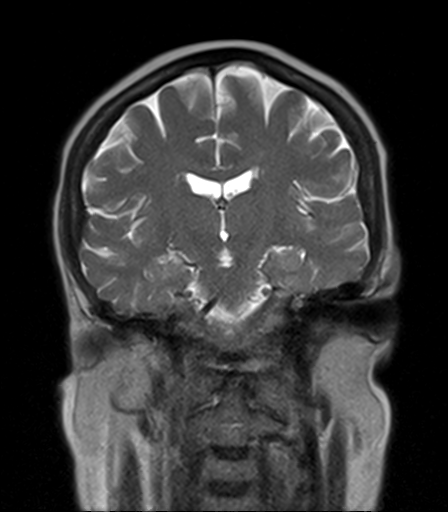
[im 31/31]
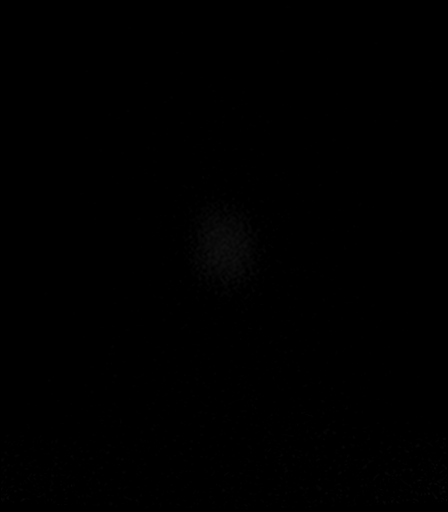

[35 of 48 positions shown; findings below may reference images not displayed]

FINDINGS: MRI HEAD FINDINGS

Brain: No restricted diffusion to suggest acute infarction. No
midline shift, mass effect, evidence of mass lesion,
ventriculomegaly, extra-axial collection or acute intracranial
hemorrhage. Cervicomedullary junction and pituitary are within
normal limits.

Scattered cerebral white matter T2 and FLAIR hyperintensity, mostly
periventricular and mild to moderate for age. No cortical
encephalomalacia or chronic cerebral blood products identified. The
deep gray matter nuclei, brainstem, and cerebellum are within normal
limits.

Vascular: Major intracranial vascular flow voids are preserved.

Skull and upper cervical spine: Negative visible cervical spine.
Visualized bone marrow signal is within normal limits.

Sinuses/Orbits: Negative orbits. Trace paranasal sinus mucosal
thickening.

Other: Mild mastoid effusions, mostly on the right. Trace retained
secretions in the nasopharynx. Grossly negative visible internal
auditory structures.

Scalp and face soft tissues appear negative.

MRA HEAD FINDINGS

Antegrade flow in the posterior circulation with dominant distal
left vertebral artery. The distal right vertebral functionally
terminates in PICA. Normal PICA origins. No distal vertebral
stenosis. Patent basilar artery without stenosis. Normal SCA and PCA
origins. Mildly tortuous left P1 segment. Bilateral PCA branches are
within normal limits.

Antegrade flow in both ICA siphons. Mild siphon irregularity
compatible with atherosclerosis. No siphon stenosis.

On the right side there is a 3 x 4-5 millimeter saccular aneurysm of
the supraclinoid ICA directed cephalad arising near the right
ophthalmic artery origin. The right ophthalmic is also tortuous.
Normal left ophthalmic artery origin. Posterior communicating
arteries are diminutive or absent.

Normal carotid termini, MCA and ACA origins. Anterior communicating
artery and visible ACA branches are within normal limits. MCA M1
segments and trifurcations appear normal. Visible MCA branches are
within normal limits.
IMPRESSION: 1. Negative for acute infarct, but positive for a Right ICA
Paraophthalmic Aneurysm measuring 4-5 mm.
2. Mild to moderate for age nonspecific cerebral white matter signal
changes, most commonly due to chronic small vessel disease.
Note that early metastatic disease to the brain cannot be excluded
in the absence of IV contrast.
3. Mild for age intracranial atherosclerosis. No intracranial artery
stenosis or major circle-of-Willis branch occlusion identified.
4. Mild mastoid effusions, typically postinflammatory.

## 2020-02-29 ENCOUNTER — Ambulatory Visit: Payer: Medicare Other | Admitting: Anesthesiology

## 2020-02-29 ENCOUNTER — Other Ambulatory Visit: Payer: Self-pay

## 2020-02-29 ENCOUNTER — Encounter: Payer: Self-pay | Admitting: Ophthalmology

## 2020-02-29 ENCOUNTER — Encounter
Admission: RE | Disposition: A | Discharge status: Home / Self Care | Payer: Self-pay | Source: Home / Self Care | Attending: Ophthalmology

## 2020-02-29 ENCOUNTER — Ambulatory Visit
Admission: RE | Admit: 2020-02-29 | Discharge: 2020-02-29 | Disposition: A | Discharge status: Home / Self Care | Payer: Medicare Other | Attending: Ophthalmology | Admitting: Ophthalmology

## 2020-02-29 DIAGNOSIS — Z853 Personal history of malignant neoplasm of breast: Secondary | ICD-10-CM | POA: Insufficient documentation

## 2020-02-29 DIAGNOSIS — I700 Atherosclerosis of aorta: Secondary | ICD-10-CM | POA: Insufficient documentation

## 2020-02-29 DIAGNOSIS — I2510 Atherosclerotic heart disease of native coronary artery without angina pectoris: Secondary | ICD-10-CM | POA: Diagnosis not present

## 2020-02-29 DIAGNOSIS — K219 Gastro-esophageal reflux disease without esophagitis: Secondary | ICD-10-CM | POA: Diagnosis not present

## 2020-02-29 DIAGNOSIS — E119 Type 2 diabetes mellitus without complications: Secondary | ICD-10-CM | POA: Insufficient documentation

## 2020-02-29 DIAGNOSIS — M810 Age-related osteoporosis without current pathological fracture: Secondary | ICD-10-CM | POA: Diagnosis not present

## 2020-02-29 DIAGNOSIS — I10 Essential (primary) hypertension: Secondary | ICD-10-CM | POA: Diagnosis not present

## 2020-02-29 DIAGNOSIS — H2512 Age-related nuclear cataract, left eye: Secondary | ICD-10-CM | POA: Insufficient documentation

## 2020-02-29 DIAGNOSIS — M1990 Unspecified osteoarthritis, unspecified site: Secondary | ICD-10-CM | POA: Insufficient documentation

## 2020-02-29 DIAGNOSIS — Z7982 Long term (current) use of aspirin: Secondary | ICD-10-CM | POA: Insufficient documentation

## 2020-02-29 DIAGNOSIS — Z7984 Long term (current) use of oral hypoglycemic drugs: Secondary | ICD-10-CM | POA: Insufficient documentation

## 2020-02-29 DIAGNOSIS — E11319 Type 2 diabetes mellitus with unspecified diabetic retinopathy without macular edema: Secondary | ICD-10-CM | POA: Insufficient documentation

## 2020-02-29 DIAGNOSIS — Z79899 Other long term (current) drug therapy: Secondary | ICD-10-CM | POA: Diagnosis not present

## 2020-02-29 DIAGNOSIS — K5790 Diverticulosis of intestine, part unspecified, without perforation or abscess without bleeding: Secondary | ICD-10-CM | POA: Diagnosis not present

## 2020-02-29 HISTORY — PX: CATARACT EXTRACTION W/PHACO: SHX586

## 2020-02-29 LAB — GLUCOSE, CAPILLARY
Glucose-Capillary: 118 mg/dL — ABNORMAL HIGH (ref 70–99)
Glucose-Capillary: 119 mg/dL — ABNORMAL HIGH (ref 70–99)

## 2020-02-29 SURGERY — PHACOEMULSIFICATION, CATARACT, WITH IOL INSERTION
Anesthesia: Monitor Anesthesia Care | Site: Eye | Laterality: Left

## 2020-02-29 MED ORDER — ACETAMINOPHEN 10 MG/ML IV SOLN: 1000.0000 mg | Freq: Once | INTRAVENOUS | Status: DC | PRN

## 2020-02-29 MED ORDER — LIDOCAINE HCL (PF) 2 % IJ SOLN
INTRAOCULAR | Status: DC | PRN
  Administered 2020-02-29: 1 mL

## 2020-02-29 MED ORDER — LACTATED RINGERS IV SOLN: INTRAVENOUS | Status: DC

## 2020-02-29 MED ORDER — ONDANSETRON HCL 4 MG/2ML IJ SOLN: 4.0000 mg | Freq: Once | INTRAMUSCULAR | Status: DC | PRN

## 2020-02-29 MED ORDER — EPINEPHRINE PF 1 MG/ML IJ SOLN
INTRAOCULAR | Status: DC | PRN
  Administered 2020-02-29: 43 mL via OPHTHALMIC

## 2020-02-29 MED ORDER — ARMC OPHTHALMIC DILATING DROPS
1.0000 "application " | OPHTHALMIC | Status: DC | PRN
  Administered 2020-02-29 (×3): 1 via OPHTHALMIC

## 2020-02-29 MED ORDER — BRIMONIDINE TARTRATE-TIMOLOL 0.2-0.5 % OP SOLN
OPHTHALMIC | Status: DC | PRN
  Administered 2020-02-29: 1 [drp] via OPHTHALMIC

## 2020-02-29 MED ORDER — FENTANYL CITRATE (PF) 100 MCG/2ML IJ SOLN
INTRAMUSCULAR | Status: DC | PRN
  Administered 2020-02-29 (×2): 50 ug via INTRAVENOUS

## 2020-02-29 MED ORDER — TETRACAINE HCL 0.5 % OP SOLN
1.0000 [drp] | OPHTHALMIC | Status: DC | PRN
  Administered 2020-02-29 (×3): 1 [drp] via OPHTHALMIC

## 2020-02-29 MED ORDER — NA CHONDROIT SULF-NA HYALURON 40-17 MG/ML IO SOLN
INTRAOCULAR | Status: DC | PRN
  Administered 2020-02-29: 1 mL via INTRAOCULAR

## 2020-02-29 MED ORDER — MOXIFLOXACIN HCL 0.5 % OP SOLN
OPHTHALMIC | Status: DC | PRN
  Administered 2020-02-29: 0.2 mL via OPHTHALMIC

## 2020-02-29 MED ORDER — MIDAZOLAM HCL 2 MG/2ML IJ SOLN
INTRAMUSCULAR | Status: DC | PRN
  Administered 2020-02-29: 2 mg via INTRAVENOUS

## 2020-02-29 SURGICAL SUPPLY — 18 items
CANNULA ANT/CHMB 27GA (MISCELLANEOUS) ×6 IMPLANT
GLOVE SURG LX 8.0 MICRO (GLOVE) ×2
GLOVE SURG LX STRL 8.0 MICRO (GLOVE) ×1 IMPLANT
GLOVE SURG TRIUMPH 8.0 PF LTX (GLOVE) ×3 IMPLANT
GOWN STRL REUS W/ TWL LRG LVL3 (GOWN DISPOSABLE) ×2 IMPLANT
GOWN STRL REUS W/TWL LRG LVL3 (GOWN DISPOSABLE) ×6
LENS IOL DIOP 21.0 (Intraocular Lens) ×3 IMPLANT
LENS IOL TECNIS MONO 21.0 (Intraocular Lens) ×1 IMPLANT
MARKER SKIN DUAL TIP RULER LAB (MISCELLANEOUS) ×3 IMPLANT
NEEDLE FILTER BLUNT 18X 1/2SAF (NEEDLE) ×2
NEEDLE FILTER BLUNT 18X1 1/2 (NEEDLE) ×1 IMPLANT
PACK EYE AFTER SURG (MISCELLANEOUS) ×3 IMPLANT
PACK OPTHALMIC (MISCELLANEOUS) ×3 IMPLANT
PACK PORFILIO (MISCELLANEOUS) ×3 IMPLANT
SYR 3ML LL SCALE MARK (SYRINGE) ×3 IMPLANT
SYR TB 1ML LUER SLIP (SYRINGE) ×3 IMPLANT
WATER STERILE IRR 250ML POUR (IV SOLUTION) ×3 IMPLANT
WIPE NON LINTING 3.25X3.25 (MISCELLANEOUS) ×3 IMPLANT

## 2020-02-29 NOTE — H&P (Signed)
All labs reviewed. Abnormal studies sent to patients PCP when indicated.  Previous H&P reviewed, patient examined, there are NO CHANGES.  Katie Knittle Porfilio7/6/202110:15 AM

## 2020-02-29 NOTE — Op Note (Signed)
PREOPERATIVE DIAGNOSIS:  Nuclear sclerotic cataract of the left eye.   POSTOPERATIVE DIAGNOSIS:  Nuclear sclerotic cataract of the left eye.   OPERATIVE PROCEDURE:@   SURGEON:  Birder [name], MD.   ANESTHESIA:  Anesthesiologist: Heniser, Fredric Dine, MD CRNA: Vanetta Shawl, CRNA  1.      Managed anesthesia care. 2.     0.66ml of Shugarcaine was instilled following the paracentesis   COMPLICATIONS:  None.   TECHNIQUE:   Stop and chop   DESCRIPTION OF PROCEDURE:  The patient was examined and consented in the preoperative holding area where the aforementioned topical anesthesia was applied to the left eye and then brought back to the Operating Room where the left eye was prepped and draped in the usual sterile ophthalmic fashion and a lid speculum was placed. A paracentesis was created with the side port blade and the anterior chamber was filled with viscoelastic. A near clear corneal incision was performed with the steel keratome. A continuous curvilinear capsulorrhexis was performed with a cystotome followed by the capsulorrhexis forceps. Hydrodissection and hydrodelineation were carried out with BSS on a blunt cannula. The lens was removed in a stop and chop  technique and the remaining cortical material was removed with the irrigation-aspiration handpiece. The capsular bag was inflated with viscoelastic and the Technis ZCB00 lens was placed in the capsular bag without complication. The remaining viscoelastic was removed from the eye with the irrigation-aspiration handpiece. The wounds were hydrated. The anterior chamber was flushed with BSS and the eye was inflated to physiologic pressure. 0.9ml Vigamox was placed in the anterior chamber. The wounds were found to be water tight. The eye was dressed with Combigan. The patient was given protective glasses to wear throughout the day and a shield with which to sleep tonight. The patient was also given drops with which to begin a drop regimen today  and will follow-up with me in one day. Implant Name Type Inv. Item Serial No. Manufacturer Lot No. LRB No. Used Action  LENS IOL DIOP 21.0 - [id number] Intraocular Lens LENS IOL DIOP 21.0 [id number] [employer]  Left 1 Implanted    Procedure(s) with comments: CATARACT EXTRACTION PHACO AND INTRAOCULAR LENS PLACEMENT (IOC) LEFT DIABETIC 2.93  00:27.1 (Left) - Diabetic - oral meds  Electronically signed: Birder [name] 02/29/2020 10:38 AM

## 2020-02-29 NOTE — Anesthesia Postprocedure Evaluation (Signed)
Anesthesia Post Note  Patient: Katie [name]  Procedure(s) Performed: CATARACT EXTRACTION PHACO AND INTRAOCULAR LENS PLACEMENT (IOC) LEFT DIABETIC 2.93  00:27.1 (Left Eye)     Patient location during evaluation: PACU Anesthesia Type: MAC Level of consciousness: awake and alert Pain management: pain level controlled Vital Signs Assessment: post-procedure vital signs reviewed and stable Respiratory status: spontaneous breathing, nonlabored ventilation, respiratory function stable and patient connected to nasal cannula oxygen Cardiovascular status: stable and blood pressure returned to baseline Postop Assessment: no apparent nausea or vomiting Anesthetic complications: no   No complications documented.  Terah Robey A  Zenobia Kuennen

## 2020-02-29 NOTE — Anesthesia Procedure Notes (Signed)
Procedure Name: MAC Date/Time: 02/29/2020 10:24 AM Performed by: Vanetta Shawl, CRNA Pre-anesthesia Checklist: Patient identified, Emergency Drugs available, Suction available, Timeout performed and Patient being monitored Patient Re-evaluated:Patient Re-evaluated prior to induction Oxygen Delivery Method: Nasal cannula Placement Confirmation: positive ETCO2

## 2020-02-29 NOTE — Anesthesia Preprocedure Evaluation (Signed)
Anesthesia Evaluation  Patient identified by MRN, date of birth, ID band Patient awake    Reviewed: Allergy & Precautions, NPO status , Patient's Chart, lab work & pertinent test results, reviewed documented beta blocker date and time   History of Anesthesia Complications Negative for: history of anesthetic complications  Airway Mallampati: III  TM Distance: >3 FB Neck ROM: Full    Dental   Pulmonary    breath sounds clear to auscultation       Cardiovascular hypertension, (-) angina+ CAD  (-) DOE  Rhythm:Regular Rate:Normal   HLD  Echocardiogram (06/22/19 : LVEF 60-65%, Normal RV size and function, mild to mod AV sclerosis. 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)    GI/Hepatic GERD  Controlled, Diverticulosis   Endo/Other  diabetes, Type 2  Renal/GU      Musculoskeletal  (+) Arthritis , Osteoarthritis,    Abdominal   Peds  Hematology  (+) anemia ,   Anesthesia Other Findings L breast cancer  Reproductive/Obstetrics                             Anesthesia Physical  Anesthesia Plan  ASA: III  Anesthesia Plan: MAC   Post-op Pain Management:    Induction: Intravenous  PONV Risk Score and Plan: 2 and TIVA, Midazolam and Treatment may vary due to age or medical condition  Airway Management Planned: Nasal Cannula  Additional Equipment:   Intra-op Plan:   Post-operative Plan:   Informed Consent: I have reviewed the patients History and Physical, chart, labs and discussed the procedure including the risks, benefits and alternatives for the proposed anesthesia with the patient or authorized representative who has indicated his/her understanding and acceptance.       Plan Discussed with: CRNA and Anesthesiologist  Anesthesia Plan Comments:         Anesthesia Quick Evaluation

## 2020-02-29 NOTE — Transfer of Care (Signed)
Immediate Anesthesia Transfer of Care Note  Patient: Katie [name]  Procedure(s) Performed: CATARACT EXTRACTION PHACO AND INTRAOCULAR LENS PLACEMENT (IOC) LEFT DIABETIC 2.93  00:27.1 (Left Eye)  Patient Location: PACU  Anesthesia Type: MAC  Level of Consciousness: awake, alert  and patient cooperative  Airway and Oxygen Therapy: Patient Spontanous Breathing and Patient connected to supplemental oxygen  Post-op Assessment: Post-op Vital signs reviewed, Patient's Cardiovascular Status Stable, Respiratory Function Stable, Patent Airway and No signs of Nausea or vomiting  Post-op Vital Signs: Reviewed and stable  Complications: No complications documented.

## 2020-03-01 ENCOUNTER — Encounter: Payer: Self-pay | Admitting: Ophthalmology

## 2020-03-20 ENCOUNTER — Other Ambulatory Visit: Payer: Self-pay | Admitting: Family Medicine

## 2020-03-27 ENCOUNTER — Telehealth: Payer: Self-pay

## 2020-03-27 NOTE — Telephone Encounter (Signed)
Pt called and LVM stating she would like for her 03/29/20 visit with Mendel Ryder to be in person rather than video. Message sent to scheduling to change this. Pt verbalized thanks.

## 2020-03-27 NOTE — Progress Notes (Signed)
S[facility]IP  VISIT:  BRIEF ONCOLOGIC HISTORY:  Oncology History  Malignant neoplasm of upper-outer quadrant of left breast in female, estrogen receptor positive ([place])  09/14/2018 Initial Diagnosis   With prior history of breast implants that were ruptured/replaced, patient had cellulitis LIQ left breast for 1 week, and UOQ asymmetry 1.5 cm, MRI right breast biopsy benign, left breast numerous masses 7 cm skin thickening: 2 biopsies from left breast: Grade 2-3 IDC ER PR positive, HER-2 positive, Ki-67 50%   09/16/2018 Cancer Staging   Staging form: Breast, AJCC 8th Edition - Clinical stage from 09/16/2018: Stage IIIB (cT4, cN0, cM0, G3, ER+, PR+, HER2+)    09/23/2018 Genetic Testing   Genetic testing performed through [facility]'s Common Hereditary Cancers Panel reported out on 09/23/2018 showed no pathogenic mutations.    The Common Hereditary Cancers Panel offered by [facility] includes sequencing and/or deletion duplication testing of the following 47 genes: APC, ATM, AXIN2, BARD1, BMPR1A, BRCA1, BRCA2, BRIP1, CDH1, CDKN2A (p14ARF), CDKN2A (p16INK4a), CKD4, CHEK2, CTNNA1, DICER1, EPCAM (Deletion/duplication testing only), GREM1 (promoter region deletion/duplication testing only), KIT, MEN1, MLH1, MSH2, MSH3, MSH6, MUTYH, NBN, NF1, NHTL1, PALB2, PDGFRA, PMS2, POLD1, POLE, PTEN, RAD50, RAD51C, RAD51D, SDHB, SDHC, SDHD, SMAD4, SMARCA4. STK11, TP53, TSC1, TSC2, and VHL.  The following genes were evaluated for sequence changes only: SDHA and HOXB13 c.251G>A variant only.   09/30/2018 - 07/06/2020 Chemotherapy   palonosetron (ALOXI) injection 0.25 mg, 0.25 mg, Intravenous,  Once, 6 of 6 cycles. Administration: 0.25 mg (09/30/2018), 0.25 mg (10/21/2018), 0.25 mg (12/23/2018), 0.25 mg (01/13/2019), 0.25 mg (11/11/2018), 0.25 mg (12/02/2018)  pegfilgrastim-cbqv (UDENYCA) injection 6 mg, 6 mg, Subcutaneous, Once, 5 of 5 cycles. Administration: 6 mg (10/02/2018), 6 mg (10/23/2018), 6 mg (11/13/2018), 6 mg (12/04/2018), 6 mg  (12/25/2018)  trastuzumab (HERCEPTIN) 600 mg in sodium chloride 0.9 % 250 mL chemo infusion, 609 mg, Intravenous,  Once, 6 of 6 cycles. Administration: 600 mg (09/30/2018), 450 mg (10/21/2018), 450 mg (12/23/2018), 450 mg (01/13/2019), 450 mg (11/11/2018), 450 mg (12/02/2018)  CARBOplatin (PARAPLATIN) 510 mg in sodium chloride 0.9 % 250 mL chemo infusion, 510 mg (103.4 % of original dose 493.2 mg), Intravenous,  Once, 6 of 6 cycles. Dose modification:   (original dose 493.2 mg, Cycle 1). Administration: 510 mg (09/30/2018), 430 mg (10/21/2018), 420 mg (12/23/2018), 420 mg (01/13/2019), 430 mg (11/11/2018), 430 mg (12/02/2018)  DOCEtaxel (TAXOTERE) 140 mg in sodium chloride 0.9 % 250 mL chemo infusion, 75 mg/m2 = 140 mg, Intravenous,  Once, 5 of 5 cycles. Dose modification: 60 mg/m2 (original dose 75 mg/m2, Cycle 2, Reason: Dose not tolerated). Administration: 140 mg (09/30/2018), 110 mg (10/21/2018), 110 mg (12/23/2018), 110 mg (11/11/2018), 110 mg (12/02/2018)  pertuzumab (PERJETA) 420 mg in sodium chloride 0.9 % 250 mL chemo infusion, 420 mg (100 % of original dose 420 mg), Intravenous, Once, 1 of 1 cycle. Dose modification: 420 mg (original dose 420 mg, Cycle 1, Reason: Provider Judgment). Administration: 420 mg (09/30/2018)  fosaprepitant (EMEND) 150 mg  dexamethasone (DECADRON) 12 mg in sodium chloride 0.9 % 145 mL IVPB, , Intravenous,  Once, 6 of 6 cycles. Administration:  (09/30/2018),  (10/21/2018),  (12/23/2018),  (01/13/2019),  (11/11/2018),  (12/02/2018)  ado-trastuzumab emtansine (KADCYLA) 260 mg in sodium chloride 0.9 % 250 mL chemo infusion, 3.6 mg/kg = 260 mg, Intravenous, Once, 6 of 10 cycles. Dose modification: 3 mg/kg (original dose 3.6 mg/kg, Cycle 5, Reason: Dose not tolerated), 2.4 mg/kg (original dose 3.6 mg/kg, Cycle 6, Reason: Provider Judgment). Administration: 260 mg (03/24/2019), 260 mg (  04/14/2019), 200 mg (06/16/2019), 260 mg (05/05/2019), 200 mg (05/26/2019), 160 mg (07/07/2019)   01/19/2019 Breast MRI    Significant interval improvement, the multiple left breast masses and areas of non mass enhancement in all 4 quadrants have decreased in size and extent. The mass previously measuring 1.4 cm with diffuse enhancement now measures 9 mm with heterogeneous areas of enhancement. Current greatest transverse dimension is 5.4 x 3.9 cm, previously 7 cm x 5cm. 2. 4 mm masslike focus of enhancement at the base of the right nipple is unchanged.   03/08/2019 Surgery   Bilateral mastectomies Katie [name]) [phone]): right breast: no malignancy; left breast: IDC, grade 2, scattered over 7.5cm, clear margins. No lymph nodes were examined.   03/08/2019 Cancer Staging   Staging form: Breast, AJCC 8th Edition - Pathologic stage from 03/08/2019: No Stage Recommended (ypT3, pNX, cM0, G2, ER+, PR+, HER2-)   05/04/2019 - 06/14/2019 Radiation Therapy   The patient initially received a dose of 50 Gy in 25 fractions to the breast using whole-breast tangent fields. This was delivered using a 3-D conformal technique. The patient also received 50 Gy in 25 fractions to the left supraclavicular region. The pt received a boost delivering an additional 10 Gy in 5 fractions using a electron boost with 26mV electrons. The total dose was 110 Gy.   05/2019 - 05/2024 Anti-estrogen oral therapy   Anastrozole     INTERVAL HISTORY:  Katie [name] review her survivorship care plan detailing her treatment course for breast cancer, as well as monitoring long-term side effects of that treatment, education regarding health maintenance, screening, and overall wellness and health promotion.     Overall, Ms. FLannomreports feeling quite well.  She is taking anastrozole daily with good tolerance.  She completed the survivorship survey, and is doing quite well.  Her only issue has been some mild peripheral neuropathy, and she snores on occasion without apneic episodes.  She is seeing pulmonology, Dr. GPatsey Bertholdwho is following her bronchiectasis and  mucous plugging noted on CT chest.  She has undergone PFTs.    REVIEW OF SYSTEMS:  Review of Systems  Constitutional: Positive for fatigue (very mild). Negative for appetite change, chills, fever and unexpected weight change.  HENT:   Negative for hearing loss, lump/mass and sore throat.   Eyes: Negative for eye problems and icterus.  Respiratory: Negative for chest tightness, cough and shortness of breath.   Cardiovascular: Negative for chest pain, leg swelling and palpitations.  Gastrointestinal: Negative for abdominal distention, abdominal pain, constipation, diarrhea, nausea and vomiting.  Endocrine: Negative for hot flashes.  Genitourinary: Negative for difficulty urinating.   Musculoskeletal: Negative for arthralgias.  Skin: Negative for itching and rash.  Neurological: Positive for numbness. Negative for dizziness, extremity weakness and headaches.  Hematological: Negative for adenopathy. Does not bruise/bleed easily.  Psychiatric/Behavioral: Negative for depression. The patient is not nervous/anxious.   Breast: Denies any new nodularity, masses, tenderness, nipple changes, or nipple discharge.      ONCOLOGY TREATMENT TEAM:  1. [facility]  [facility] 2. Medical Oncologist: Dr. GLindi Adie 3. Radiation Oncologist: Dr. SIsidore Moos   PAST MEDICAL/SURGICAL HISTORY:  Past Medical History:  Diagnosis Date  . Allergy   . Anemia   . 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 Blood transfusion without reported diagnosis   . Cancer ([place]    breast 08/2018  . Coronary artery disease   . Diverticulitis   . DJD (  degenerative joint disease)   . DM type 2 (diabetes mellitus, type 2) ([place])   . Family history of breast cancer   . Fibrocystic breast disease   . GERD (gastroesophageal reflux disease)   . History of chicken pox   . Hypercholesteremia   . Hypertension   . Lichen planopilaris   . Urinary incontinence   . Vertigo 12/2019   1  episode   Past Surgical History:  Procedure Laterality Date  . BREAST IMPLANT REMOVAL Bilateral 03/08/2019   Procedure: Removal of Bilateral Breast Implants;  Surgeon: Rolm Bookbinder, MD;  Location: [place];  Service: General;  Laterality: Bilateral;  . BREAST SURGERY  02/1975   Left Breast Biopsy-Fibrocystic Disease  . BREAST SURGERY  10/1981   Bialteral Capsulectomy Breast Surgery  . BREAST SURGERY  07/1998   Replace Bilateral Silicone Implants  . BREAST SURGERY  01/1976   Bilater breast surgery to remove fibrocystic tissue and replace with silicone implants  . CATARACT EXTRACTION W/PHACO Right 02/08/2020   Procedure: CATARACT EXTRACTION PHACO AND INTRAOCULAR LENS PLACEMENT ([place]) RIGHT DIABETIC;  Surgeon: Birder [name], MD;  Location: [facility];  Service: Ophthalmology;  Laterality: Right;  3.55 0:33.7  . CATARACT EXTRACTION W/PHACO Left 02/29/2020   Procedure: CATARACT EXTRACTION PHACO AND INTRAOCULAR LENS PLACEMENT ([facility]) LEFT DIABETIC 2.93  00:27.1;  Surgeon: Birder [name], MD;  Location: [facility];  Service: Ophthalmology;  Laterality: Left;  Diabetic - oral meds  . CHOLECYSTECTOMY N/A 07/08/2016   Procedure: LAPAROSCOPIC CHOLECYSTECTOMY;  Surgeon: [name], MD;  Location: [facility];  Service: General;  Laterality: N/A;  . COLONOSCOPY WITH PROPOFOL N/A 11/07/2017   Procedure: COLONOSCOPY WITH PROPOFOL;  Surgeon: Jonathon Bellows, MD;  [facility];  Service: Gastroenterology;  Laterality: N/A;  . DILATION AND CURETTAGE OF UTERUS  01/1994  . ESOPHAGOGASTRODUODENOSCOPY (EGD) WITH PROPOFOL N/A 12/05/2016   Procedure: ESOPHAGOGASTRODUODENOSCOPY (EGD) WITH PROPOFOL;  Surgeon: Jonathon Bellows, MD;  Location: [facility] ENDOSCOPY;  Service: Endoscopy;  Laterality: N/A;  . FINGER SURGERY  2001 & 2003   Trigger Finger  . FOOT SURGERY  1980   To Relieve Pinched Nerve  . FOOT SURGERY  07/2008   Left Foot-Plantar Fasciitis  . JOINT REPLACEMENT  04/19/2013   Hip  Replacement-Right  . MASTECTOMY W/ SENTINEL NODE BIOPSY Bilateral 03/08/2019   Procedure: BILATERAL MASTECTOMIES WITH LEFT AXILLARY SENTINEL LYMPH NODE BIOPSY AND BLUE DYE INJECTION;  Surgeon: Rolm Bookbinder, MD;  Location: [place];  Service: General;  Laterality: Bilateral;  . PARTIAL HIP ARTHROPLASTY Right   . PLACEMENT OF BREAST IMPLANTS  12/26/2004   Replace Failed Implants  . REPLACEMENT TOTAL KNEE Left    2019-  . RHINOPLASTY  03/1964   To Correct Deviated Septum     ALLERGIES:  Allergies  Allergen Reactions  . Erythromycin Anaphylaxis    REACTION: Rash, hives  . Ciprofloxacin Rash  . Daypro [Oxaprozin] Rash  . Enalapril Maleate Nausea Only and Rash  . Nsaids Nausea Only  . Vioxx [Rofecoxib] Nausea Only     CURRENT MEDICATIONS:  Outpatient Encounter Medications as of 03/29/2020  Medication Sig  . anastrozole (ARIMIDEX) 1 MG tablet Take 1 tablet (1 mg total) by mouth daily.  [name] aspirin 81 MG EC tablet Take 81 mg by mouth daily. Swallow whole.  [name] atorvastatin (LIPITOR) 20 MG tablet Take 1 tablet (20 mg total) by mouth daily.  . Calcium Carbonate-Vit D-Min (CALTRATE 600+D PLUS MINERALS) 600-800 MG-UNIT TABS Take 1 tablet by mouth in the morning and at  bedtime.  . clobetasol (TEMOVATE) 0.05 % external solution Apply 1 application topically 2 (two) times daily as needed (scalp). Reported on 08/16/2015  . fluticasone (FLONASE) 50 MCG/ACT nasal spray SHAKE LIQUID AND USE 2 SPRAYS IN EACH NOSTRIL EVERY DAY  . gabapentin (NEURONTIN) 300 MG capsule Take 1 capsule (300 mg total) by mouth at bedtime.  [name] glucose blood (FREESTYLE LITE) test strip USE TO CHECK BLOOD SUGAR DAILY  . ketoconazole (NIZORAL) 2 % cream APPLY TO THE AFFECTED AREA ON FACE TWICE DAILY UNTIL CLEAR  . loratadine (CLARITIN) 10 MG tablet Take 10 mg by mouth daily.  [name] losartan-hydrochlorothiazide (HYZAAR) 100-25 MG tablet Take 1 tablet by mouth daily.  . metFORMIN (GLUCOPHAGE) 500 MG tablet Take 1 tablet (500 mg  total) by mouth 2 (two) times daily with a meal.  . Multiple Vitamins-Minerals (CENTRUM SILVER 50+WOMEN) TABS Take 1 tablet by mouth daily.  Katie [name] Glycol-Propyl Glycol (SYSTANE OP) Apply to eye.  . [DISCONTINUED] prochlorperazine (COMPAZINE) 10 MG tablet Take 1 tablet (10 mg total) by mouth every 6 (six) hours as needed (Nausea or vomiting).   No facility-administered encounter medications on file as of 03/29/2020.     ONCOLOGIC FAMILY HISTORY:  Family History  Problem Relation Age of Onset  . Arthritis Mother   . Hypertension Mother   . Heart disease Father   . Diabetes Father   . Heart attack Father   . Breast cancer Sister 32       d. 37  . Hypertension Brother   . Leukemia Brother   . Coronary artery disease Brother   . Heart attack Brother      GENETIC COUNSELING/TESTING: See above  SOCIAL HISTORY:  Social History   Socioeconomic History  . Marital status: Married    Spouse name: Not on file  . Number of children: 1  . Years of education: Not on file  . Highest education level: Not on file  Occupational History  . Occupation: Retired  Tobacco Use  . Smoking status: Never Smoker  . Smokeless tobacco: Never Used  Vaping Use  . Vaping Use: Never used  Substance and Sexual Activity  . Alcohol use: Not Currently    Alcohol/week: 0.0 standard drinks    Comment: rarely  . Drug use: No  . Sexual activity: Not Currently    Partners: Male  Other Topics Concern  . Not on file  Social History Narrative   Married for 37 years.    She has one child and one stepston.      Social Determinants of Health   Financial Resource Strain:   . Difficulty of Paying Living Expenses:   Food Insecurity:   . Worried About Charity fundraiser in the Last Year:   . Arboriculturist in the Last Year:   Transportation Needs: No Transportation Needs  . Lack of Transportation (Medical): No  . Lack of Transportation (Non-Medical): No  Physical Activity:   . Days of Exercise  per Week:   . Minutes of Exercise per Session:   Stress:   . Feeling of Stress :   Social Connections:   . Frequency of Communication with Friends and Family:   . Frequency of Social Gatherings with Friends and Family:   . Attends Religious Services:   . Active Member of Clubs or Organizations:   . Attends Archivist Meetings:   [name] Marital Status:   Intimate Partner Violence: Not At Risk  . Fear of Current or  Ex-Partner: No  . Emotionally Abused: No  . Physically Abused: No  . Sexually Abused: No     OBSERVATIONS/OBJECTIVE:  BP (!) 147/64 (BP Location: Right Arm, Patient Position: Sitting)   Pulse 79   Temp 98.7 F (37.1 C) (Temporal)   Resp 17   Ht 5' 2" (1.575 m)   Wt 165 lb 14.2 oz (75.2 kg)   LMP 08/26/1992 (Approximate)   SpO2 96%   BMI 30.34 kg/m  GENERAL: Patient is a well appearing female in no acute distress HEENT:  Sclerae anicteric.  Mask in place. Neck is supple.  NODES:  No cervical, supraclavicular, or axillary lymphadenopathy palpated.  BREAST EXAM:  Deferred. LUNGS:  Clear to auscultation bilaterally.  No wheezes or rhonchi. HEART:  Regular rate and rhythm. No murmur appreciated. ABDOMEN:  Soft, nontender.  Positive, normoactive bowel sounds. No organomegaly palpated. MSK:  No focal spinal tenderness to palpation. Full range of motion bilaterally in the upper extremities. EXTREMITIES:  No peripheral edema.   SKIN:  Clear with no obvious rashes or skin changes. No nail dyscrasia. NEURO:  Nonfocal. Well oriented.  Appropriate affect.    LABORATORY DATA:  None for this visit.  DIAGNOSTIC IMAGING:  None for this visit.      ASSESSMENT AND PLAN:  Ms.. [name] is a pleasant 74 y.o. 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  She presents to the Survivorship Clinic for our initial meeting and routine follow-up post-completion of treatment for breast cancer.    1. Stage IIIB left breast cancer:  Katie [name] is continuing to recover from definitive treatment for breast cancer. She will follow-up with her medical oncologist, Dr. Lindi Adie in 07/2020 with history and physical exam per surveillance protocol.  She will continue her anti-estrogen therapy with Anastrozole. Thus far, she is tolerating the Anastrozole well, with minimal side effects. She was instructed to make Dr. Lindi Adie or myself aware if she begins to experience any worsening side effects of the medication and I could see her back in clinic to help manage those side effects, as needed.Today, a comprehensive survivorship care plan and treatment summary was reviewed with the patient today detailing her breast cancer diagnosis, treatment course, potential late/long-term effects of treatment, appropriate follow-up care with recommendations for the future, and patient education resources.  A copy of this summary, along with a letter will be sent to the patient's primary care provider via mail/fax/In Basket message after today's visit.    2. Chemotherapy induced peripheral neuropathy: this is mild and improving.  No interventions are needed at this point.  3. Bone health:  Given Katie [name]'s age/history of breast cancer and her current treatment regimen including anti-estrogen therapy with Anastrozole, she is at risk for bone demineralization.  Her last DEXA scan was 08/2018, which showed osteoporosis in the left radius with T score of -2.5, all other sites were normal.  She was given education on specific activities to promote bone health.  4. Cancer screening:  Due to Katie [name]'s history and her age, she should receive screening for skin cancers, colon cancer, and gynecologic cancers.  The information and recommendations are listed on the patient's  comprehensive care plan/treatment summary and were reviewed in detail with the patient.    5. Health maintenance and wellness promotion: Katie [name] was encouraged to consume 5-7 servings of fruits and  vegetables per day. We reviewed the "Nut[employer]" handout, as well as the handout "Take Control of Your Health and Reduce Your Cancer Risk" from the [facility].  She was also encouraged to engage in moderate to vigorous exercise for 30 minutes per day most days of the week. We discussed the [employer] fitness program, which is designed for cancer survivors to help them become more physically fit after cancer treatments.  She was instructed to limit her alcohol consumption and continue to abstain from tobacco use.     6. Support services/counseling: It is not uncommon for this period of the patient's cancer care trajectory to be one of many emotions and stressors.  We discussed how this can be increasingly difficult during the times of quarantine and social distancing due to the COVID-19 pandemic.   She was given information regarding our available services and encouraged to contact me with any questions or for help enrolling in any of our support group/programs.    Follow up instructions:    -Return to cancer center 07/2020 for f/u with Dr. Lindi Adie  -Bone density due 08/2020 -Follow up with surgery 01/2021 -She is welcome to return back to the Survivorship Clinic at any time; no additional follow-up needed at this time.  -Consider referral back to survivorship as a long-term survivor for continued surveillance  The patient was provided an opportunity to ask questions and all were answered. The patient agreed with the plan and demonstrated an understanding of the instructions.   Total encounter time: 45 minutes*  Wilber Bihari, NP 03/27/20 5:15 PM Medical Oncology and Hematology [facility] [place], [place] [address] Tel. [phone]    Fax.  [phone]  *Total Encounter Time as defined by the Centers for Medicare and Medicaid Services includes, in addition to the face-to-face time of a patient visit (documented in the note above) non-face-to-face time: obtaining and reviewing outside history, ordering and reviewing medications, tests or procedures, care coordination (communications with other health care professionals or caregivers) and documentation in the medical record.

## 2020-03-29 ENCOUNTER — Encounter: Payer: Self-pay | Admitting: Adult Health

## 2020-03-29 ENCOUNTER — Other Ambulatory Visit: Payer: Self-pay

## 2020-03-29 ENCOUNTER — Inpatient Hospital Stay: Payer: Medicare Other | Attending: Hematology and Oncology | Admitting: Adult Health

## 2020-03-29 VITALS — BP 147/64 | HR 79 | Temp 98.7°F | Resp 17 | Ht 62.0 in | Wt 165.9 lb

## 2020-03-29 DIAGNOSIS — I2510 Atherosclerotic heart disease of native coronary artery without angina pectoris: Secondary | ICD-10-CM | POA: Diagnosis not present

## 2020-03-29 DIAGNOSIS — Z170 Estrogen receptor positive status [ER+]: Secondary | ICD-10-CM | POA: Insufficient documentation

## 2020-03-29 DIAGNOSIS — Z79811 Long term (current) use of aromatase inhibitors: Secondary | ICD-10-CM | POA: Diagnosis not present

## 2020-03-29 DIAGNOSIS — G620 Drug-induced polyneuropathy: Secondary | ICD-10-CM | POA: Insufficient documentation

## 2020-03-29 DIAGNOSIS — Z9013 Acquired absence of bilateral breasts and nipples: Secondary | ICD-10-CM | POA: Diagnosis not present

## 2020-03-29 DIAGNOSIS — Z79899 Other long term (current) drug therapy: Secondary | ICD-10-CM | POA: Diagnosis not present

## 2020-03-29 DIAGNOSIS — Z7984 Long term (current) use of oral hypoglycemic drugs: Secondary | ICD-10-CM | POA: Diagnosis not present

## 2020-03-29 DIAGNOSIS — C50412 Malignant neoplasm of upper-outer quadrant of left female breast: Secondary | ICD-10-CM | POA: Insufficient documentation

## 2020-03-30 ENCOUNTER — Encounter: Payer: Self-pay | Admitting: Adult Health

## 2020-03-30 ENCOUNTER — Telehealth: Payer: Self-pay | Admitting: Adult Health

## 2020-03-30 NOTE — Telephone Encounter (Signed)
No 8/4 los, no changes made to pt schedule  

## 2020-04-12 ENCOUNTER — Other Ambulatory Visit: Payer: Self-pay

## 2020-04-12 ENCOUNTER — Encounter: Payer: Self-pay | Admitting: Pulmonary Disease

## 2020-04-12 ENCOUNTER — Ambulatory Visit: Payer: Medicare Other | Admitting: Pulmonary Disease

## 2020-04-12 VITALS — BP 142/84 | HR 77 | Temp 97.3°F | Ht 62.0 in | Wt 163.0 lb

## 2020-04-12 DIAGNOSIS — I272 Pulmonary hypertension, unspecified: Secondary | ICD-10-CM

## 2020-04-12 DIAGNOSIS — J479 Bronchiectasis, uncomplicated: Secondary | ICD-10-CM

## 2020-04-12 DIAGNOSIS — R0683 Snoring: Secondary | ICD-10-CM

## 2020-04-12 NOTE — Patient Instructions (Signed)
Your breathing tests were good.  We are going to schedule a home sleep study.  Follow-up in 3 months time call sooner should any new problems arise.

## 2020-04-20 ENCOUNTER — Other Ambulatory Visit: Payer: Self-pay | Admitting: Family Medicine

## 2020-04-21 DIAGNOSIS — D225 Melanocytic nevi of trunk: Secondary | ICD-10-CM | POA: Diagnosis not present

## 2020-04-21 DIAGNOSIS — D2271 Melanocytic nevi of right lower limb, including hip: Secondary | ICD-10-CM | POA: Diagnosis not present

## 2020-04-21 DIAGNOSIS — D2262 Melanocytic nevi of left upper limb, including shoulder: Secondary | ICD-10-CM | POA: Diagnosis not present

## 2020-04-21 DIAGNOSIS — D2272 Melanocytic nevi of left lower limb, including hip: Secondary | ICD-10-CM | POA: Diagnosis not present

## 2020-04-21 DIAGNOSIS — D2261 Melanocytic nevi of right upper limb, including shoulder: Secondary | ICD-10-CM | POA: Diagnosis not present

## 2020-04-21 DIAGNOSIS — L821 Other seborrheic keratosis: Secondary | ICD-10-CM | POA: Diagnosis not present

## 2020-04-21 DIAGNOSIS — L668 Other cicatricial alopecia: Secondary | ICD-10-CM | POA: Diagnosis not present

## 2020-04-24 ENCOUNTER — Ambulatory Visit: Payer: Medicare Other | Attending: Internal Medicine

## 2020-04-24 DIAGNOSIS — Z23 Encounter for immunization: Secondary | ICD-10-CM

## 2020-04-24 NOTE — Progress Notes (Signed)
   Covid-19 Vaccination Clinic  Name:  KEILEIGH VAHEY    MRN: [id number] DOB: [birth year]  04/24/2020  Ms. Mazer was observed post Covid-19 immunization for 30 minutes based on pre-vaccination screening without incident. She was provided with Vaccine Information Sheet and instruction to access the V-Safe system.   Ms. Sitzer was instructed to call 911 with any severe reactions post vaccine: [name] Difficulty breathing  . Swelling of face and throat  . A fast heartbeat  . A bad rash all over body  . Dizziness and weakness

## 2020-04-25 ENCOUNTER — Ambulatory Visit: Payer: Medicare Other

## 2020-04-25 ENCOUNTER — Other Ambulatory Visit: Payer: Self-pay

## 2020-04-25 DIAGNOSIS — I272 Pulmonary hypertension, unspecified: Secondary | ICD-10-CM

## 2020-04-25 DIAGNOSIS — R0683 Snoring: Secondary | ICD-10-CM

## 2020-04-25 DIAGNOSIS — G4733 Obstructive sleep apnea (adult) (pediatric): Secondary | ICD-10-CM | POA: Diagnosis not present

## 2020-05-01 ENCOUNTER — Telehealth: Payer: Self-pay | Admitting: Family Medicine

## 2020-05-01 DIAGNOSIS — E559 Vitamin D deficiency, unspecified: Secondary | ICD-10-CM

## 2020-05-01 DIAGNOSIS — E113293 Type 2 diabetes mellitus with mild nonproliferative diabetic retinopathy without macular edema, bilateral: Secondary | ICD-10-CM

## 2020-05-01 LAB — HM DIABETES FOOT EXAM

## 2020-05-01 NOTE — Telephone Encounter (Signed)
-----   Message from Cloyd Stagers, RT sent at 04/18/2020  1:32 PM EDT ----- Regarding: Lab Orders for Tuesday 9.7.2021 Please place lab orders for Tuesday 9.7.2021, office visit for physical on Tuesday 9.14.2021 Thank you, Dyke Maes RT(R)

## 2020-05-02 ENCOUNTER — Other Ambulatory Visit: Payer: Self-pay

## 2020-05-02 ENCOUNTER — Ambulatory Visit (INDEPENDENT_AMBULATORY_CARE_PROVIDER_SITE_OTHER): Payer: Medicare Other

## 2020-05-02 ENCOUNTER — Other Ambulatory Visit (INDEPENDENT_AMBULATORY_CARE_PROVIDER_SITE_OTHER): Payer: Medicare Other

## 2020-05-02 DIAGNOSIS — Z0000 Encounter for general adult medical examination without abnormal findings: Secondary | ICD-10-CM | POA: Diagnosis not present

## 2020-05-02 DIAGNOSIS — I2510 Atherosclerotic heart disease of native coronary artery without angina pectoris: Secondary | ICD-10-CM

## 2020-05-02 DIAGNOSIS — E113293 Type 2 diabetes mellitus with mild nonproliferative diabetic retinopathy without macular edema, bilateral: Secondary | ICD-10-CM | POA: Diagnosis not present

## 2020-05-02 DIAGNOSIS — E559 Vitamin D deficiency, unspecified: Secondary | ICD-10-CM | POA: Diagnosis not present

## 2020-05-02 DIAGNOSIS — E7800 Pure hypercholesterolemia, unspecified: Secondary | ICD-10-CM

## 2020-05-02 LAB — COMPREHENSIVE METABOLIC PANEL
ALT: 22 U/L (ref 0–35)
AST: 21 U/L (ref 0–37)
Albumin: 4.3 g/dL (ref 3.5–5.2)
Alkaline Phosphatase: 66 U/L (ref 39–117)
BUN: 22 mg/dL (ref 6–23)
CO2: 27 mEq/L (ref 19–32)
Calcium: 9.6 mg/dL (ref 8.4–10.5)
Chloride: 100 mEq/L (ref 96–112)
Creatinine, Ser: 0.98 mg/dL (ref 0.40–1.20)
GFR: 55.26 mL/min — ABNORMAL LOW (ref >60.00)
Glucose, Bld: 145 mg/dL — ABNORMAL HIGH (ref 70–99)
Potassium: 4.2 mEq/L (ref 3.5–5.1)
Sodium: 138 mEq/L (ref 135–145)
Total Bilirubin: 0.5 mg/dL (ref 0.2–1.2)
Total Protein: 6.9 g/dL (ref 6.0–8.3)

## 2020-05-02 LAB — LIPID PANEL
Cholesterol: 140 mg/dL (ref 0–200)
HDL: 38.9 mg/dL — ABNORMAL LOW (ref >39.00)
NonHDL: 101.54
Total CHOL/HDL Ratio: 4
Triglycerides: 369 mg/dL — ABNORMAL HIGH (ref 0.0–149.0)
VLDL: 73.8 mg/dL — ABNORMAL HIGH (ref 0.0–40.0)

## 2020-05-02 LAB — VITAMIN D 25 HYDROXY (VIT D DEFICIENCY, FRACTURES): VITD: 38.04 ng/mL (ref 30.00–100.00)

## 2020-05-02 LAB — HEMOGLOBIN A1C: Hgb A1c MFr Bld: 5.9 % (ref 4.6–6.5)

## 2020-05-02 LAB — LDL CHOLESTEROL, DIRECT: Direct LDL: 57 mg/dL

## 2020-05-02 NOTE — Progress Notes (Signed)
PCP notes:  Health Maintenance: Flu- due Tdap- due, Patient is willing to pay out of pocket for this vaccine Foot exam- due   Abnormal Screenings: none   Patient concerns: Knot on right side of abdomen    Nurse concerns: none   Next PCP appt.: 05/09/2020 @ 9:40 am

## 2020-05-02 NOTE — Addendum Note (Signed)
Addended by: Ellamae Sia on: 05/02/2020 03:07 PM   Modules accepted: Orders

## 2020-05-02 NOTE — Progress Notes (Signed)
Subjective:   Katie [name] is a 74 y.o. female who presents for Medicare Annual (Subsequent) preventive examination.  Review of Systems: N/A      I connected with the patient today by telephone and verified that I am speaking with the correct person using two identifiers. Location patient: home Location nurse: work Persons participating in the telephone visit: patient, nurse.   I discussed the limitations, risks, security and privacy concerns of performing an evaluation and management service by telephone and the availability of in person appointments. I also discussed with the patient that there may be a patient responsible charge related to this service. The patient expressed understanding and verbally consented to this telephonic visit.        Cardiac Risk Factors include: advanced age (>68men, >52 women);diabetes mellitus     Objective:    Today's Vitals   There is no height or weight on file to calculate BMI.  Advanced Directives 05/02/2020 03/29/2020 02/29/2020 02/08/2020 01/27/2020 11/10/2019 04/20/2019  Does Patient Have a Medical Advance Directive? Yes Yes Yes Yes Yes Yes Yes  Type of Paramedic of [place];Living will [place];Living will Healthcare Power of [place] of [place];Living will [place];Living will [place]  Does patient want to make changes to medical advance directive? - - No - Patient declined - - No - Patient declined -  Copy of [place] in Chart? Yes - validated most recent copy scanned in chart (See row information) No - copy requested No - copy requested No - copy requested - No - copy requested No - copy requested  Would patient like information on creating a medical advance directive? - No - Patient declined No - Patient declined No - Patient declined No - Patient declined - No - Patient declined    Current  Medications (verified) Outpatient Encounter Medications as of 05/02/2020  Medication Sig  . anastrozole (ARIMIDEX) 1 MG tablet Take 1 tablet (1 mg total) by mouth daily.  [name] aspirin 81 MG EC tablet Take 81 mg by mouth daily. Swallow whole.  [name] atorvastatin (LIPITOR) 20 MG tablet Take 1 tablet (20 mg total) by mouth daily.  . Calcium Carbonate-Vit D-Min (CALTRATE 600+D PLUS MINERALS) 600-800 MG-UNIT TABS Take 1 tablet by mouth in the morning and at bedtime.  . clobetasol (TEMOVATE) 0.05 % external solution Apply 1 application topically 2 (two) times daily as needed (scalp). Reported on 08/16/2015  . fluticasone (FLONASE) 50 MCG/ACT nasal spray SHAKE LIQUID AND USE 2 SPRAYS IN EACH NOSTRIL EVERY DAY  . gabapentin (NEURONTIN) 300 MG capsule Take 1 capsule (300 mg total) by mouth at bedtime.  [name] glucose blood (FREESTYLE LITE) test strip USE TO CHECK BLOOD SUGAR DAILY  . ketoconazole (NIZORAL) 2 % cream APPLY TO THE AFFECTED AREA ON FACE TWICE DAILY UNTIL CLEAR  . loratadine (CLARITIN) 10 MG tablet Take 10 mg by mouth daily.  [name] losartan-hydrochlorothiazide (HYZAAR) 100-25 MG tablet Take 1 tablet by mouth daily.  . metFORMIN (GLUCOPHAGE) 500 MG tablet TAKE 1 TABLET(500 MG) BY MOUTH TWICE DAILY WITH A MEAL  . Multiple Vitamins-Minerals (CENTRUM SILVER 50+WOMEN) TABS Take 1 tablet by mouth daily.  Vladimir Faster Glycol-Propyl Glycol (SYSTANE OP) Apply to eye.  . [DISCONTINUED] prochlorperazine (COMPAZINE) 10 MG tablet Take 1 tablet (10 mg total) by mouth every 6 (six) hours as needed (Nausea or vomiting).   No facility-administered encounter medications on file as  of 05/02/2020.    Allergies (verified) Erythromycin, Ciprofloxacin, Daypro [oxaprozin], Enalapril maleate, Nsaids, and Vioxx [rofecoxib]   History: Past Medical History:  Diagnosis Date  . Allergy   . Anemia   . 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 Blood transfusion without reported diagnosis   . Cancer ([place])     breast 08/2018  . Coronary artery disease   . Diverticulitis   . DJD (degenerative joint disease)   . DM type 2 (diabetes mellitus, type 2) ([place])   . Family history of breast cancer   . Fibrocystic breast disease   . GERD (gastroesophageal reflux disease)   . History of chicken pox   . Hypercholesteremia   . Hypertension   . Lichen planopilaris   . Urinary incontinence   . Vertigo 12/2019   1 episode   Past Surgical History:  Procedure Laterality Date  . BREAST IMPLANT REMOVAL Bilateral 03/08/2019   Procedure: Removal of Bilateral Breast Implants;  Surgeon: Rolm Bookbinder, MD;  Location: [facility];  Service: General;  Laterality: Bilateral;  . BREAST SURGERY  02/1975   Left Breast Biopsy-Fibrocystic Disease  . BREAST SURGERY  10/1981   Bialteral Capsulectomy Breast Surgery  . BREAST SURGERY  07/1998   Replace Bilateral Silicone Implants  . BREAST SURGERY  01/1976   [place] breast surgery to remove fibrocystic tissue and replace with silicone implants  . CATARACT EXTRACTION W/PHACO Right 02/08/2020   Procedure: CATARACT EXTRACTION PHACO AND INTRAOCULAR LENS PLACEMENT ([place]) RIGHT DIABETIC;  Surgeon: Birder [name], MD;  Location: [facility];  Service: Ophthalmology;  Laterality: Right;  3.55 0:33.7  . CATARACT EXTRACTION W/PHACO Left 02/29/2020   Procedure: CATARACT EXTRACTION PHACO AND INTRAOCULAR LENS PLACEMENT ([facility]) LEFT DIABETIC 2.93  00:27.1;  Surgeon: Birder [name], MD;  Location: [facility];  Service: Ophthalmology;  Laterality: Left;  Diabetic - oral meds  . CHOLECYSTECTOMY N/A 07/08/2016   Procedure: LAPAROSCOPIC CHOLECYSTECTOMY;  Surgeon: [name], MD;  Location: [facility];  Service: General;  Laterality: N/A;  . COLONOSCOPY WITH PROPOFOL N/A 11/07/2017   Procedure: COLONOSCOPY WITH PROPOFOL;  Surgeon: Jonathon Bellows, MD;  [facility];  Service: Gastroenterology;  Laterality: N/A;  . DILATION AND CURETTAGE OF UTERUS  01/1994  .  ESOPHAGOGASTRODUODENOSCOPY (EGD) WITH PROPOFOL N/A 12/05/2016   Procedure: ESOPHAGOGASTRODUODENOSCOPY (EGD) WITH PROPOFOL;  Surgeon: Jonathon Bellows, MD;  Location: [facility] END[facility];  Service: Endoscopy;  Laterality: N/A;  . FINGER SURGERY  2001 & 2003   Trigger Finger  . FOOT SURGERY  1980   To Relieve Pinched Nerve  . FOOT SURGERY  07/2008   Left Foot-Plantar Fasciitis  . JOINT REPLACEMENT  04/19/2013   Hip Replacement-Right  . MASTECTOMY W/ SENTINEL NODE BIOPSY Bilateral 03/08/2019   Procedure: BILATERAL MASTECTOMIES WITH LEFT AXILLARY SENTINEL LYMPH NODE BIOPSY AND BLUE DYE INJECTION;  Surgeon: Rolm Bookbinder, MD;  Location: [facility];  Service: General;  Laterality: Bilateral;  . PARTIAL HIP ARTHROPLASTY Right   . PLACEMENT OF BREAST IMPLANTS  12/26/2004   Replace Failed Implants  . REPLACEMENT TOTAL KNEE Left    2019-  . RHINOPLASTY  03/1964   To Correct Deviated Septum   Family History  Problem Relation Age of Onset  . Arthritis Mother   . Hypertension Mother   . Heart disease Father   . Diabetes Father   . Heart attack Father   . Breast cancer Sister 32       d. 50  . Hypertension Brother   .  Leukemia Brother   . Coronary artery disease Brother   . Heart attack Brother    Social History   Socioeconomic History  . Marital status: Married    Spouse name: Not on file  . Number of children: 1  . Years of education: Not on file  . Highest education level: Not on file  Occupational History  . Occupation: Retired  Tobacco Use  . Smoking status: Never Smoker  . Smokeless tobacco: Never Used  Vaping Use  . Vaping Use: Never used  Substance and Sexual Activity  . Alcohol use: Not Currently    Alcohol/week: 0.0 standard drinks    Comment: rarely  . Drug use: No  . Sexual activity: Not Currently    Partners: Male  Other Topics Concern  . Not on file  Social History Narrative   Married for 37 years.    She has one child and one stepston.      Social Determinants of  Health   Financial Resource Strain: Low Risk   . Difficulty of Paying Living Expenses: Not hard at all  Food Insecurity: No Food Insecurity  . Worried About Charity fundraiser in the Last Year: Never true  . Ran Out of Food in the Last Year: Never true  Transportation Needs: No Transportation Needs  . Lack of Transportation (Medical): No  . Lack of Transportation (Non-Medical): No  Physical Activity: Sufficiently Active  . Days of Exercise per Week: 3 days  . Minutes of Exercise per Session: 60 min  Stress: No Stress Concern Present  . Feeling of Stress : Not at all  Social Connections:   . Frequency of Communication with Friends and Family: Not on file  . Frequency of Social Gatherings with Friends and Family: Not on file  . Attends Religious Services: Not on file  . Active Member of Clubs or Organizations: Not on file  . Attends Archivist Meetings: Not on file  . Marital Status: Not on file    Tobacco Counseling Counseling given: Not Answered   Clinical Intake:  Pre-visit preparation completed: Yes  Pain : No/denies pain     Nutritional Risks: None Diabetes: Yes CBG done?: No Did pt. bring in CBG monitor from home?: No  How often do you need to have someone help you when you read instructions, pamphlets, or other written materials from your doctor or pharmacy?: 1 - Never What is the last grade level you completed in school?: 2 masters degrees  Diabetic: Yes Nutrition Risk Assessment:  Has the patient had any N/V/D within the last 2 months?  No  Does the patient have any non-healing wounds?  No  Has the patient had any unintentional weight loss or weight gain?  No   Diabetes:  Is the patient diabetic?  Yes  If diabetic, was a CBG obtained today?  No  Did the patient bring in their glucometer from home?  No  How often do you monitor your CBG's? When needed.   Financial Strains and Diabetes Management:  Are you having any financial strains with  the device, your supplies or your medication? No .  Does the patient want to be seen by Chronic Care Management for management of their diabetes?  No  Would the patient like to be referred to a Nutritionist or for Diabetic Management?  No   Diabetic Exams:  Diabetic Eye Exam: Completed 12/23/2019 Diabetic Foot Exam: Overdue, Pt has been advised about the importance in completing this exam. Pt  is scheduled for diabetic foot exam on 05/09/2020.   Interpreter Needed?: No  Information entered by :: CJohnson, LPN   Activities of Daily Living In your present state of health, do you have any difficulty performing the following activities: 05/02/2020 02/29/2020  Hearing? N N  Vision? N N  Difficulty concentrating or making decisions? N N  Walking or climbing stairs? N N  Dressing or bathing? N N  Doing errands, shopping? N -  Preparing Food and eating ? N -  Using the Toilet? N -  In the past six months, have you accidently leaked urine? N -  Do you have problems with loss of bowel control? N -  Managing your Medications? N -  Managing your Finances? N -  Housekeeping or managing your Housekeeping? N -  Some recent data might be hidden    Patient Care Team: [name], MD as PCP - General (Family Medicine) Kate Sable, MD as PCP - Cardiology (Cardiology) Birder [name], MD as Referring Physician (Ophthalmology) [name], MD as Consulting Physician (Dermatology) [name], MD as Consulting Physician (General Surgery) [name], MD as Consulting Physician (Dentistry) [name], MD (Inactive) as Consulting Physician (General Surgery) [name], MD as Consulting Physician (Hematology and Oncology) [name], MD as Attending Physician (Radiation Oncology) Rolm Bookbinder, MD as Consulting Physician (General Surgery)  Indicate any recent [place] you may have received from other than [facility] providers in the past year (date may be  approximate).     Assessment:   This is a routine wellness examination for Julena.  Hearing/Vision screen  Hearing Screening   125Hz  250Hz  500Hz  1000Hz  2000Hz  3000Hz  4000Hz  6000Hz  8000Hz   Right ear:           Left ear:           Vision Screening Comments: Patient gets annual eye exams   Dietary issues and exercise activities discussed: Current Exercise Habits: Structured exercise class, Type of exercise: strength training/weights, Time (Minutes): 60, Frequency (Times/Week): 3, Weekly Exercise (Minutes/Week): 180, Intensity: Moderate, Exercise limited by: None identified  Goals    . Patient Stated     Starting 01/06/2019, I will continue to take medications as prescribed.     . Patient Stated     05/02/2020, I will continue to [facility] to the senior center and exercise 3 days a week for about 30 minutes- 1 hour.       Depression Screen PHQ 2/9 Scores 05/02/2020 01/06/2019 10/13/2017 10/08/2016 10/06/2015 10/04/2014  PHQ - 2 Score 0 0 0 0 0 0  PHQ- 9 Score 0 0 0 - - -    Fall Risk Fall Risk  05/02/2020 01/06/2019 10/13/2017 10/08/2016 10/06/2015  Falls in the past year? 0 0 No No No  Number falls in past yr: 0 - - - -  Injury with Fall? 0 - - - -  Risk for fall due to : Medication side effect - - - -  Follow up Falls evaluation completed;Falls prevention discussed - - - -    Any stairs in or around the home? Yes  If so, are there any without handrails? No  Home free of loose throw rugs in walkways, pet beds, electrical cords, etc? Yes  Adequate lighting in your home to reduce risk of falls? Yes   ASSISTIVE DEVICES UTILIZED TO PREVENT FALLS:  Life alert? No  Use of a cane, walker or w/c? No  Grab bars in the bathroom? No  Shower chair or bench  in shower? No  Elevated toilet seat or a handicapped toilet? No   TIMED UP AND GO:  Was the test performed? N/A, telephonic visit .    Cognitive Function: MMSE - Mini Mental State Exam 05/02/2020 01/06/2019 10/13/2017 10/08/2016  Orientation to time  5 5 5 5   Orientation to Place 5 5 5 5   Registration 3 3 3 3   Attention/ Calculation 5 0 0 0  Recall 3 3 3 3   Language- name 2 objects - 0 0 0  Language- repeat 1 1 1 1   Language- follow 3 step command - 0 3 3  Language- read & follow direction - 0 0 0  Write a sentence - 0 0 0  Copy design - 0 0 0  Total score - 17 20 20   Mini Cog  Mini-Cog screen was completed. Maximum score is 22. A value of 0 denotes this part of the MMSE was not completed or the patient failed this part of the Mini-Cog screening.       Immunizations Immunization History  Administered Date(s) Administered  . Fluad Quad(high Dose 65+) 04/30/2019  . Hep A / Hep B 05/29/2009, 07/03/2009, 11/24/2009  . Influenza, High Dose Seasonal PF 04/18/2014, 04/13/2015, 04/30/2018  . Influenza, Seasonal, Injecte, Preservative Fre 06/26/2016  . Influenza-Unspecified 05/17/2009, 04/02/2011, 05/20/2012, 06/02/2013, 07/01/2013, 04/18/2014, 04/13/2015, 06/27/2016, 04/11/2017  . PFIZER SARS-COV-2 Vaccination 10/06/2019, 10/27/2019, 04/24/2020  . Pneumococcal Conjugate-13 04/18/2014  . Pneumococcal Polysaccharide-23 10/06/2015  . Td 05/17/2009  . Typhoid Inactivated 05/17/2009  . Yellow Fever 05/17/2009  . Zoster 04/02/2011    TDAP status: Due, Education has been provided regarding the importance of this vaccine. Advised may receive this vaccine at local pharmacy or Health Dept. Aware to provide a copy of the vaccination record if obtained from local pharmacy or Health Dept. Verbalized acceptance and understanding. Flu Vaccine status: due, will get at upcoming physical Pneumococcal vaccine status: Up to date Covid-19 vaccine status: Completed vaccines  Qualifies for Shingles Vaccine? Yes   Zostavax completed Yes   Shingrix Completed?: No.    Education has been provided regarding the importance of this vaccine. Patient has been advised to call insurance company to determine out of pocket expense if they have not yet received  this vaccine. Advised may also receive vaccine at local pharmacy or Health Dept. Verbalized acceptance and understanding.  Screening Tests Health Maintenance  Topic Date Due  . INFLUENZA VACCINE  03/26/2020  . FOOT EXAM  04/29/2020  . TETANUS/TDAP  05/02/2025 (Originally 05/18/2019)  . DEXA SCAN  08/31/2020  . HEMOGLOBIN A1C  10/30/2020  . OPHTHALMOLOGY EXAM  05/02/2021  . COLONOSCOPY  11/08/2022  . COVID-19 Vaccine  Completed  . Hepatitis C Screening  Completed  . PNA vac Low Risk Adult  Completed  . MAMMOGRAM  Discontinued    Health Maintenance  Health Maintenance Due  Topic Date Due  . INFLUENZA VACCINE  03/26/2020  . FOOT EXAM  04/29/2020    Colorectal cancer screening: Completed 10/28/2017. Repeat every 5 years Mammogram status: no longer required per patient- has no breast tissue due to history of breast cancer Bone Density status: Completed 08/31/2018. Results reflect: Bone density results: OSTEOPOROSIS. Repeat every 2 years.  Lung Cancer Screening: (Low Dose CT Chest recommended if Age 54-80 years, 30 pack-year currently smoking OR have quit w/in 15 years.) does not qualify.    Additional Screening:  Hepatitis C Screening: does qualify; Completed 10/03/2015  Vision Screening: Recommended annual ophthalmology exams for early detection of glaucoma  and other disorders of the eye. Is the patient up to date with their annual eye exam?  Yes  Who is the provider or what is the name of the office in which the patient attends annual eye exams? Dr. Linton Flemings If pt is not established with a provider, would they like to be referred to a provider to establish care? No .   Dental Screening: Recommended annual dental exams for proper oral hygiene  Community Resource Referral / Chronic Care Management: CRR required this visit?  No   CCM required this visit?  No      Plan:     I have personally reviewed and noted the following in the patient's chart:   . Medical and social  history . Use of alcohol, tobacco or illicit drugs  . Current medications and supplements . Functional ability and status . Nutritional status . Physical activity . Advanced directives . List of other physicians . Hospitalizations, surgeries, and ER visits in previous 12 months . Vitals . Screenings to include cognitive, depression, and falls . Referrals and appointments  In addition, I have reviewed and discussed with patient certain preventive protocols, quality metrics, and best practice recommendations. A written personalized care plan for preventive services as well as general preventive health recommendations were provided to patient.   Due to this being a telephonic visit, the after visit summary with patients personalized plan was offered to patient via mail or my-chart. Patient preferred to pick up at office at next visit.   Andrez Grime, LPN   [id number]

## 2020-05-02 NOTE — Addendum Note (Signed)
Addended by: Ellamae Sia on: 05/02/2020 09:02 AM   Modules accepted: Orders

## 2020-05-02 NOTE — Patient Instructions (Signed)
Katie [name] , Thank you for taking time to come for your Medicare Wellness Visit. I appreciate your ongoing commitment to your health goals. Please review the following plan we discussed and let me know if I can assist you in the future.   Screening recommendations/referrals: Colonoscopy: Up to date, completed 10/28/2017, due 10/2022 Mammogram: no longer required Bone Density: Up to date, completed 08/31/2018, due 08/2020 Recommended yearly ophthalmology/optometry visit for glaucoma screening and checkup Recommended yearly dental visit for hygiene and checkup  Vaccinations: Influenza vaccine: due, will get at upcoming physical Pneumococcal vaccine: Completed series Tdap vaccine: decline- insurance/financial Shingles vaccine: due, check with your insurance regarding coverage    Covid-19:Completed series  Advanced directives: copy in chart  Conditions/risks identified: diabetes  Next appointment: Follow up in one year for your annual wellness visit    Preventive Care 65 Years and Older, Female Preventive care refers to lifestyle choices and visits with your health care provider that can promote health and wellness. What does preventive care include?  A yearly physical exam. This is also called an annual well check.  Dental exams once or twice a year.  Routine eye exams. Ask your health care provider how often you should have your eyes checked.  Personal lifestyle choices, including:  Daily care of your teeth and gums.  Regular physical activity.  Eating a healthy diet.  Avoiding tobacco and drug use.  Limiting alcohol use.  Practicing safe sex.  Taking low-dose aspirin every day.  Taking vitamin and mineral supplements as recommended by your health care provider. What happens during an annual well check? The services and screenings done by your health care provider during your annual well check will depend on your age, overall health, lifestyle risk factors, and family  history of disease. Counseling  Your health care provider may ask you questions about your:  Alcohol use.  Tobacco use.  Drug use.  Emotional well-being.  Home and relationship well-being.  Sexual activity.  Eating habits.  History of falls.  Memory and ability to understand (cognition).  Work and work Statistician.  Reproductive health. Screening  You may have the following tests or measurements:  Height, weight, and BMI.  Blood pressure.  Lipid and cholesterol levels. These may be checked every 5 years, or more frequently if you are over 38 years old.  Skin check.  Lung cancer screening. You may have this screening every year starting at age 78 if you have a 30-pack-year history of smoking and currently smoke or have quit within the past 15 years.  Fecal occult blood test (FOBT) of the stool. You may have this test every year starting at age 75.  Flexible sigmoidoscopy or colonoscopy. You may have a sigmoidoscopy every 5 years or a colonoscopy every 10 years starting at age 75.  Hepatitis C blood test.  Hepatitis B blood test.  Sexually transmitted disease (STD) testing.  Diabetes screening. This is done by checking your blood sugar (glucose) after you have not eaten for a while (fasting). You may have this done every 1-3 years.  Bone density scan. This is done to screen for osteoporosis. You may have this done starting at age 60.  Mammogram. This may be done every 1-2 years. Talk to your health care provider about how often you should have regular mammograms. Talk with your health care provider about your test results, treatment options, and if necessary, the need for more tests. Vaccines  Your health care provider may recommend certain vaccines, such as:  Influenza vaccine. This is recommended every year.  Tetanus, diphtheria, and acellular pertussis (Tdap, Td) vaccine. You may need a Td booster every 10 years.  Zoster vaccine. You may need this after  age 76.  Pneumococcal 13-valent conjugate (PCV13) vaccine. One dose is recommended after age 1.  Pneumococcal polysaccharide (PPSV23) vaccine. One dose is recommended after age 64. Talk to your health care provider about which screenings and vaccines you need and how often you need them. This information is not intended to replace advice given to you by your health care provider. Make sure you discuss any questions you have with your health care provider. Document Released: 09/08/2015 Document Revised: 05/01/2016 Document Reviewed: 06/13/2015 Elsevier Interactive Patient Education  2017 [employer] Prevention in the Home Falls can cause injuries. They can happen to people of all ages. There are many things you can do to make your home safe and to help prevent falls. What can I do on the outside of my home?  Regularly fix the edges of walkways and driveways and fix any cracks.  Remove anything that might make you trip as you walk through a door, such as a raised step or threshold.  Trim any bushes or trees on the path to your home.  Use bright outdoor lighting.  Clear any walking paths of anything that might make someone trip, such as rocks or tools.  Regularly check to see if handrails are loose or broken. Make sure that both sides of any steps have handrails.  Any raised decks and porches should have guardrails on the edges.  Have any leaves, snow, or ice cleared regularly.  Use sand or salt on walking paths during winter.  Clean up any spills in your garage right away. This includes oil or grease spills. What can I do in the bathroom?  Use night lights.  Install grab bars by the toilet and in the tub and shower. Do not use towel bars as grab bars.  Use non-skid mats or decals in the tub or shower.  If you need to sit down in the shower, use a plastic, non-slip stool.  Keep the floor dry. Clean up any water that spills on the floor as soon as it  happens.  Remove soap buildup in the tub or shower regularly.  Attach bath mats securely with double-sided non-slip rug tape.  Do not have throw rugs and other things on the floor that can make you trip. What can I do in the bedroom?  Use night lights.  Make sure that you have a light by your bed that is easy to reach.  Do not use any sheets or blankets that are too big for your bed. They should not hang down onto the floor.  Have a firm chair that has side arms. You can use this for support while you get dressed.  Do not have throw rugs and other things on the floor that can make you trip. What can I do in the kitchen?  Clean up any spills right away.  Avoid walking on wet floors.  Keep items that you use a lot in easy-to-reach places.  If you need to reach something above you, use a strong step stool that has a grab bar.  Keep electrical cords out of the way.  Do not use floor polish or wax that makes floors slippery. If you must use wax, use non-skid floor wax.  Do not have throw rugs and other things on the floor that  can make you trip. What can I do with my stairs?  Do not leave any items on the stairs.  Make sure that there are handrails on both sides of the stairs and use them. Fix handrails that are broken or loose. Make sure that handrails are as long as the stairways.  Check any carpeting to make sure that it is firmly attached to the stairs. Fix any carpet that is loose or worn.  Avoid having throw rugs at the top or bottom of the stairs. If you do have throw rugs, attach them to the floor with carpet tape.  Make sure that you have a light switch at the top of the stairs and the bottom of the stairs. If you do not have them, ask someone to add them for you. What else can I do to help prevent falls?  Wear shoes that:  Do not have high heels.  Have rubber bottoms.  Are comfortable and fit you well.  Are closed at the toe. Do not wear sandals.  If you  use a stepladder:  Make sure that it is fully opened. Do not climb a closed stepladder.  Make sure that both sides of the stepladder are locked into place.  Ask someone to hold it for you, if possible.  Clearly mark and make sure that you can see:  Any grab bars or handrails.  First and last steps.  Where the edge of each step is.  Use tools that help you move around (mobility aids) if they are needed. These include:  Canes.  Walkers.  Scooters.  Crutches.  Turn on the lights when you go into a dark area. Replace any light bulbs as soon as they burn out.  Set up your furniture so you have a clear path. Avoid moving your furniture around.  If any of your floors are uneven, fix them.  If there are any pets around you, be aware of where they are.  Review your medicines with your doctor. Some medicines can make you feel dizzy. This can increase your chance of falling. Ask your doctor what other things that you can do to help prevent falls. This information is not intended to replace advice given to you by your health care provider. Make sure you discuss any questions you have with your health care provider. Document Released: 06/08/2009 Document Revised: 01/18/2016 Document Reviewed: 09/16/2014 Elsevier Interactive Patient Education  2017 [employer].

## 2020-05-02 NOTE — Progress Notes (Signed)
No critical labs need to be addressed urgently. We will discuss labs in detail at upcoming office visit.   

## 2020-05-03 ENCOUNTER — Telehealth: Payer: Self-pay | Admitting: Pulmonary Disease

## 2020-05-03 DIAGNOSIS — G4733 Obstructive sleep apnea (adult) (pediatric): Secondary | ICD-10-CM | POA: Diagnosis not present

## 2020-05-03 NOTE — Telephone Encounter (Signed)
Home sleep study 04/25/20 showed moderate obstructive sleep apnea with an AHI of 15.4 and SpO2 low of 76%.   Will route to Dr. Patsey Berthold to follow up with patient.

## 2020-05-05 NOTE — Telephone Encounter (Signed)
Patient has moderate sleep apnea.  Recommend AutoSet CPAP 0-20 cm H2O pressure with download in 2 weeks to determine optimal pressure.

## 2020-05-09 ENCOUNTER — Ambulatory Visit: Payer: Medicare Other | Admitting: Family Medicine

## 2020-05-10 NOTE — Telephone Encounter (Signed)
Dr. Patsey Berthold, just to verify do you want settings 0-20cm or 5-20cm?

## 2020-05-10 NOTE — Telephone Encounter (Signed)
5 cm to 20 cm H2O

## 2020-05-10 NOTE — Telephone Encounter (Signed)
Patient is aware of results and voiced her understanding.  Order has been placed for cpap, as patient wished to proceed.  Patient will call back to schedule an appointment once setup on cpap.  Nothing further is needed.

## 2020-05-12 ENCOUNTER — Other Ambulatory Visit: Payer: Self-pay

## 2020-05-12 ENCOUNTER — Telehealth: Payer: Self-pay

## 2020-05-12 ENCOUNTER — Ambulatory Visit (INDEPENDENT_AMBULATORY_CARE_PROVIDER_SITE_OTHER): Payer: Medicare Other | Admitting: Family Medicine

## 2020-05-12 VITALS — BP 124/60 | HR 81 | Temp 97.8°F | Ht 62.0 in | Wt 161.2 lb

## 2020-05-12 DIAGNOSIS — Z23 Encounter for immunization: Secondary | ICD-10-CM | POA: Diagnosis not present

## 2020-05-12 DIAGNOSIS — T451X5A Adverse effect of antineoplastic and immunosuppressive drugs, initial encounter: Secondary | ICD-10-CM | POA: Diagnosis not present

## 2020-05-12 DIAGNOSIS — E113293 Type 2 diabetes mellitus with mild nonproliferative diabetic retinopathy without macular edema, bilateral: Secondary | ICD-10-CM

## 2020-05-12 DIAGNOSIS — I700 Atherosclerosis of aorta: Secondary | ICD-10-CM | POA: Diagnosis not present

## 2020-05-12 DIAGNOSIS — J479 Bronchiectasis, uncomplicated: Secondary | ICD-10-CM

## 2020-05-12 DIAGNOSIS — C50412 Malignant neoplasm of upper-outer quadrant of left female breast: Secondary | ICD-10-CM | POA: Diagnosis not present

## 2020-05-12 DIAGNOSIS — E785 Hyperlipidemia, unspecified: Secondary | ICD-10-CM

## 2020-05-12 DIAGNOSIS — I720 Aneurysm of carotid artery: Secondary | ICD-10-CM

## 2020-05-12 DIAGNOSIS — E1159 Type 2 diabetes mellitus with other circulatory complications: Secondary | ICD-10-CM

## 2020-05-12 DIAGNOSIS — E1169 Type 2 diabetes mellitus with other specified complication: Secondary | ICD-10-CM

## 2020-05-12 DIAGNOSIS — G620 Drug-induced polyneuropathy: Secondary | ICD-10-CM

## 2020-05-12 DIAGNOSIS — Z0000 Encounter for general adult medical examination without abnormal findings: Secondary | ICD-10-CM

## 2020-05-12 DIAGNOSIS — I2510 Atherosclerotic heart disease of native coronary artery without angina pectoris: Secondary | ICD-10-CM | POA: Diagnosis not present

## 2020-05-12 DIAGNOSIS — I10 Essential (primary) hypertension: Secondary | ICD-10-CM

## 2020-05-12 DIAGNOSIS — Z170 Estrogen receptor positive status [ER+]: Secondary | ICD-10-CM | POA: Diagnosis not present

## 2020-05-12 DIAGNOSIS — I152 Hypertension secondary to endocrine disorders: Secondary | ICD-10-CM

## 2020-05-12 NOTE — Assessment & Plan Note (Signed)
Possible SE to metformin.. stop.[name] re-eval A1C in 3 months.

## 2020-05-12 NOTE — Assessment & Plan Note (Signed)
At goal on losartan, HCTZ

## 2020-05-12 NOTE — Assessment & Plan Note (Signed)
Followed by opthalmology.       

## 2020-05-12 NOTE — Assessment & Plan Note (Signed)
Currently actively treated on Arimidex.

## 2020-05-12 NOTE — Assessment & Plan Note (Signed)
At goal on atorvastatin, now on 20 mg

## 2020-05-12 NOTE — Assessment & Plan Note (Signed)
HAs upcoming re-eval with MRI brain.

## 2020-05-12 NOTE — Progress Notes (Signed)
Chief Complaint  Patient presents with  . Medicare Wellness    part 2     History of Present Illness: HPI   The patient presents for review of chronic health problems. He/She also has the following acute concerns today: none  The patient saw a LPN or RN for medicare wellness visit.  Prevention and wellness was reviewed in detail. Note reviewed and important notes copied below.  Health Maintenance: Flu- due Tdap- due, Patient is willing to pay out of pocket for this vaccine Foot exam- due Abnormal Screenings: none    05/12/20    S/P breast cancer treatment on arimidex  Hypertension:  At goal on losartan, HCTZ BP Readings from Last 3 Encounters:  05/12/20 124/60  04/12/20 (!) 142/84  03/29/20 (!) 147/64  Using medication without problems or lightheadedness: none Chest pain with exertion:none Edema:none Short of breath: stable Average home BPs: Other issues: Followed by cardiology.  Elevated Cholesterol:  At goal on atorvastatin, now on 20 mg Lab Results  Component Value Date   CHOL 140 05/02/2020   HDL 38.90 (L) 05/02/2020   LDLCALC 25 01/06/2019   LDLDIRECT 57.0 05/02/2020   TRIG 369.0 (H) 05/02/2020   CHOLHDL 4 05/02/2020  Using medications without problems: Muscle aches:  Diet compliance: healthy eating habits.. veggies, low fat, low carb Exercise: walking some Other complaints:  Diabetes:    Stable control on metformin but she feels metformin may becausing bloating Lab Results  Component Value Date   HGBA1C 5.9 05/02/2020  Using medications without difficulties: Hypoglycemic episodes:none Hyperglycemic episodes:none  Feet problems: no uclers, has chemo induced neuropathy ono change Blood Sugars averaging:  FBS 119 eye exam within last year: yes, retinopathy.   Aortic atherosclerosis.[name] LDL goal < 70   Bronchiectasis s/p chemo:   Dr. Duwayne Heck.  recent breathing test was normal Recent dx of sleep apnea.. recommending CPAP. She is  hesitant.   This visit occurred during the SARS-CoV-2 public health emergency.  Safety protocols were in place, including screening questions prior to the visit, additional usage of staff PPE, and extensive cleaning of exam room while observing appropriate contact time as indicated for disinfecting solutions.   COVID 19 screen:  No recent travel or known exposure to COVID19 The patient denies respiratory symptoms of COVID 19 at this time. The importance of social distancing was discussed today.     Review of Systems  Constitutional: Negative for chills and fever.  HENT: Negative for congestion and ear pain.   Eyes: Negative for pain and redness.  Respiratory: Negative for cough and shortness of breath.   Cardiovascular: Negative for chest pain, palpitations and leg swelling.  Gastrointestinal: Negative for abdominal pain, blood in stool, constipation, diarrhea, nausea and vomiting.       Abd bloating  Genitourinary: Negative for dysuria.  Musculoskeletal: Negative for falls and myalgias.  Skin: Negative for rash.  Neurological: Negative for dizziness.  Psychiatric/Behavioral: Negative for depression. The patient is not nervous/anxious.       Past Medical History:  Diagnosis Date  . Allergy   . Anemia   . 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 Blood transfusion without reported diagnosis   . Cancer ([place])    breast 08/2018  . Coronary artery disease   . Diverticulitis   . DJD (degenerative joint disease)   . DM type 2 (diabetes mellitus, type 2) ([place])   . Family history of breast cancer   . Fibrocystic breast disease   .  GERD (gastroesophageal reflux disease)   . History of chicken pox   . Hypercholesteremia   . Hypertension   . Lichen planopilaris   . Urinary incontinence   . Vertigo 12/2019   1 episode    reports that she has never smoked. She has never used smokeless tobacco. She reports previous alcohol use. She reports that she does not use  drugs.   Current Outpatient Medications:  .  anastrozole (ARIMIDEX) 1 MG tablet, Take 1 tablet (1 mg total) by mouth daily., Disp: 90 tablet, Rfl: 3 .  aspirin 81 MG EC tablet, Take 81 mg by mouth daily. Swallow whole., Disp: , Rfl:  .  atorvastatin (LIPITOR) 20 MG tablet, Take 1 tablet (20 mg total) by mouth daily., Disp: 90 tablet, Rfl: 3 .  Calcium Carbonate-Vit D-Min (CALTRATE 600+D PLUS MINERALS) 600-800 MG-UNIT TABS, Take 1 tablet by mouth in the morning and at bedtime., Disp: , Rfl:  .  clobetasol (TEMOVATE) 0.05 % external solution, Apply 1 application topically 2 (two) times daily as needed (scalp). Reported on 08/16/2015, Disp: , Rfl:  .  fluticasone (FLONASE) 50 MCG/ACT nasal spray, SHAKE LIQUID AND USE 2 SPRAYS IN EACH NOSTRIL EVERY DAY, Disp: 16 g, Rfl: 5 .  gabapentin (NEURONTIN) 300 MG capsule, Take 1 capsule (300 mg total) by mouth at bedtime., Disp: 90 capsule, Rfl: 3 .  glucose blood (FREESTYLE LITE) test strip, USE TO CHECK BLOOD SUGAR DAILY, Disp: 50 strip, Rfl: 11 .  loratadine (CLARITIN) 10 MG tablet, Take 10 mg by mouth daily., Disp: , Rfl:  .  losartan-hydrochlorothiazide (HYZAAR) 100-25 MG tablet, Take 1 tablet by mouth daily., Disp: 90 tablet, Rfl: 3 .  metFORMIN (GLUCOPHAGE) 500 MG tablet, TAKE 1 TABLET(500 MG) BY MOUTH TWICE DAILY WITH A MEAL, Disp: 60 tablet, Rfl: 0 .  Multiple Vitamins-Minerals (CENTRUM SILVER 50+WOMEN) TABS, Take 1 tablet by mouth daily., Disp: , Rfl:  .  Polyethyl Glycol-Propyl Glycol (SYSTANE OP), Apply to eye., Disp: , Rfl:  .  ketoconazole (NIZORAL) 2 % cream, APPLY TO THE AFFECTED AREA ON FACE TWICE DAILY UNTIL CLEAR (Patient not taking: Reported on 05/12/2020), Disp: , Rfl:    Observations/Objective: Blood pressure 124/60, pulse 81, temperature 97.8 F (36.6 C), temperature source Temporal, height 5\' 2"  (1.575 m), weight 161 lb 4 oz (73.1 kg), last menstrual period 08/26/1992, SpO2 97 %.  Physical Exam Constitutional:      General: She is  not in acute distress.    Appearance: Normal appearance. She is well-developed. She is obese. She is not ill-appearing or toxic-appearing.  HENT:     Head: Normocephalic.     Right Ear: Hearing, tympanic membrane, ear canal and external ear normal.     Left Ear: Hearing, tympanic membrane, ear canal and external ear normal.     Nose: Nose normal.  Eyes:     General: Lids are normal. Lids are everted, no foreign bodies appreciated.     Conjunctiva/sclera: Conjunctivae normal.     Pupils: Pupils are equal, round, and reactive to light.  Neck:     Thyroid: No thyroid mass or thyromegaly.     Vascular: No carotid bruit.     Trachea: Trachea normal.  Cardiovascular:     Rate and Rhythm: Normal rate and regular rhythm.     Heart sounds: Normal heart sounds, S1 normal and S2 normal. No murmur heard.  No gallop.   Pulmonary:     Effort: Pulmonary effort is normal. No respiratory distress.  Breath sounds: Normal breath sounds. No wheezing, rhonchi or rales.  Abdominal:     General: Bowel sounds are normal. There is no distension or abdominal bruit.     Palpations: Abdomen is soft. There is no fluid wave or mass.     Tenderness: There is no abdominal tenderness. There is no guarding or rebound.     Hernia: No hernia is present.  Musculoskeletal:     Cervical back: Normal range of motion and neck supple.  Lymphadenopathy:     Cervical: No cervical adenopathy.  Skin:    General: Skin is warm and dry.     Findings: No rash.  Neurological:     Mental Status: She is alert.     Cranial Nerves: No cranial nerve deficit.     Sensory: No sensory deficit.  Psychiatric:        Mood and Affect: Mood is not anxious or depressed.        Speech: Speech normal.        Behavior: Behavior normal. Behavior is cooperative.        Judgment: Judgment normal.      Diabetic foot exam: Normal inspection No skin breakdown No calluses  Normal DP pulses Normal sensation to light touch and  monofilament Nails normal  Assessment and Plan   The patient's preventative maintenance and recommended screening tests for an annual wellness exam were reviewed in full today. Brought up to date unless services declined.  Counselled on the importance of diet, exercise, and its role in overall health and mortality. The patient's FH and SH was reviewed, including their home life, tobacco status, and drug and alcohol status.   Last PAP 09/2014 nml, neg HPV, no further indicated. DVE:no family history of ovarian or uterine cancer  No further mammogram after bilateral mastectomy. DEXA ( osteoporosis in forearm, nml in other sites,08/2018)plans repeat in 2 years Vaccines:uptodate, high dose flu given,given Td,, uptodate with PNA and COVID series. Colon: 10/2017 Dr. Vicente Males repeat 5 years. Hep C: neg Nonsmoker  Hyperlipidemia associated with type 2 diabetes mellitus ([place]) At goal on atorvastatin, now on 20 mg  Hypertension associated with diabetes ([place])  At goal on losartan, HCTZ   Controlled type 2 diabetes with retinopathy ([place]) Possible SE to metformin.. stop.[name] re-eval A1C in 3 months.  Diabetic retinopathy ([place]) Followed by opthalmology.  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  Bronchiectasis without complication ([place]) Secondary to chemo. Followed by pulmonary.  Aortic atherosclerosis ([facility]) Risk factor management on statin.  Breast cancer, left breast ([place]) Currently actively treated on Arimidex.  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    Eliezer Lofts, MD

## 2020-05-12 NOTE — Assessment & Plan Note (Signed)
Secondary to chemo. Followed by pulmonary.

## 2020-05-12 NOTE — Patient Instructions (Addendum)
Stop metformin  for now to see if bloating is improved.   Call if FBS > 150 consistently. Work on weight loss , healthy eating and increase in activity.

## 2020-05-12 NOTE — Assessment & Plan Note (Signed)
Risk factor management:  on statin.  

## 2020-05-12 NOTE — Assessment & Plan Note (Signed)
Stable on gabapentin.   

## 2020-05-12 NOTE — Telephone Encounter (Signed)
LM (okay per DPR) for patient to please call back. We need to have her come back and sign the ABN waiver for the Td she received on 05/12/20 at her office visit. See Katie [name] for ABN for patient to sign when she arrives.

## 2020-05-20 ENCOUNTER — Other Ambulatory Visit: Payer: Self-pay | Admitting: Family Medicine

## 2020-05-20 ENCOUNTER — Other Ambulatory Visit: Payer: Self-pay | Admitting: Hematology and Oncology

## 2020-05-30 ENCOUNTER — Telehealth: Payer: Self-pay | Admitting: Pulmonary Disease

## 2020-05-30 DIAGNOSIS — G4733 Obstructive sleep apnea (adult) (pediatric): Secondary | ICD-10-CM

## 2020-05-30 NOTE — Telephone Encounter (Signed)
Pt returning call.  [phone]

## 2020-05-30 NOTE — Telephone Encounter (Signed)
Spoke to patient, who stated that [facility] is not able to provide her with a cpap, due to a worker's comp showing as primary for her insurance. Patient stated that she has not had worker's comp in years.  Patient is requesting for order to be placed to another DME company.  Order has been placed for different DME company.  Nothing further needed.

## 2020-05-30 NOTE — Telephone Encounter (Signed)
Lm for patient.  

## 2020-06-06 ENCOUNTER — Telehealth: Payer: Self-pay | Admitting: Pulmonary Disease

## 2020-06-06 NOTE — Telephone Encounter (Signed)
Spoke to patient, who stated that she would like to proceed with [facility] for cpap.  Patient is requesting that we can cancel order with [facility].  Rodena Piety, please advise. Thanks

## 2020-06-06 NOTE — Telephone Encounter (Signed)
Rec'd confirmation from [place] with [facility] see below [place], Madilyn Fireman, Demetra Shiner, [place]; Verl Blalock Received and scheduled for 06/26/20 at [facility]

## 2020-06-06 NOTE — Telephone Encounter (Signed)
I resent CM to [facility] to see if they can use previous order of if they need new order

## 2020-06-11 ENCOUNTER — Other Ambulatory Visit: Payer: Self-pay | Admitting: Family Medicine

## 2020-06-23 ENCOUNTER — Other Ambulatory Visit: Payer: Self-pay

## 2020-06-23 ENCOUNTER — Other Ambulatory Visit (INDEPENDENT_AMBULATORY_CARE_PROVIDER_SITE_OTHER): Payer: Medicare Other

## 2020-06-23 DIAGNOSIS — E7800 Pure hypercholesterolemia, unspecified: Secondary | ICD-10-CM

## 2020-06-23 DIAGNOSIS — I2510 Atherosclerotic heart disease of native coronary artery without angina pectoris: Secondary | ICD-10-CM

## 2020-06-24 LAB — LIPID PANEL
Chol/HDL Ratio: 4.6 ratio — ABNORMAL HIGH (ref 0.0–4.4)
Cholesterol, Total: 169 mg/dL (ref 100–199)
HDL: 37 mg/dL — ABNORMAL LOW (ref >39)
LDL Chol Calc (NIH): 85 mg/dL (ref 0–99)
Triglycerides: 283 mg/dL — ABNORMAL HIGH (ref 0–149)
VLDL Cholesterol Cal: 47 mg/dL — ABNORMAL HIGH (ref 5–40)

## 2020-06-28 ENCOUNTER — Other Ambulatory Visit: Payer: Self-pay

## 2020-06-28 ENCOUNTER — Ambulatory Visit (INDEPENDENT_AMBULATORY_CARE_PROVIDER_SITE_OTHER): Payer: Medicare Other | Admitting: Pulmonary Disease

## 2020-06-28 ENCOUNTER — Encounter: Payer: Self-pay | Admitting: Pulmonary Disease

## 2020-06-28 VITALS — BP 126/78 | HR 87 | Temp 97.0°F | Ht 62.0 in | Wt 163.6 lb

## 2020-06-28 DIAGNOSIS — J479 Bronchiectasis, uncomplicated: Secondary | ICD-10-CM

## 2020-06-28 DIAGNOSIS — I272 Pulmonary hypertension, unspecified: Secondary | ICD-10-CM

## 2020-06-28 DIAGNOSIS — R059 Cough, unspecified: Secondary | ICD-10-CM | POA: Diagnosis not present

## 2020-06-28 DIAGNOSIS — G4733 Obstructive sleep apnea (adult) (pediatric): Secondary | ICD-10-CM | POA: Diagnosis not present

## 2020-06-28 MED ORDER — ARNUITY ELLIPTA 100 MCG/ACT IN AEPB: 1.0000 | INHALATION_SPRAY | Freq: Every day | RESPIRATORY_TRACT | 6 refills | Status: DC

## 2020-06-28 NOTE — Progress Notes (Signed)
Subjective:    Patient ID: Katie [name], female    DOB: December 08, 1945, 74 y.o.   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  She has noted that her cough started mostly after initiation of anastrozole, she has some occasional issues with reflux but not to a significant degree.  Overall no new symptomatology from her initial visit here on 26 January 2020.  She has not had any fevers, chills or sweats.  No sputum production per se.  No hemoptysis.  She has had some mild chest discomfort and is to see cardiology tomorrow.  The discomfort appears to be mostly on the chest wall.  Not associated with arm pain or jaw pain and no relation to exertion or otherwise.  DATA: 09/24/2019 2D echo: LVEF 60 to 65%, normal LV function, indeterminate LV diastolic parameters. Mildly elevated pulmonary artery systolic pressure. 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  Very mild cylindrical bronchiectasis in the lungs bilaterally with some mucus impaction right lower lobe.  Mediastinal adenopathy. 02/17/2020 PFT: Normal spirometry and lung volumes.  Review of Systems A 10 point review of systems was performed and it is as noted above otherwise negative.   Allergies  Allergen Reactions  . Erythromycin Anaphylaxis    REACTION: Rash, hives  . Ciprofloxacin Rash  . Daypro [Oxaprozin] Rash  . Enalapril Maleate Nausea Only and Rash  . Nsaids Nausea Only  . Vioxx [Rofecoxib] Nausea Only   Current Meds  Medication Sig  . anastrozole (ARIMIDEX) 1 MG tablet TAKE 1 TABLET(1 MG) BY MOUTH DAILY  . aspirin 81 MG EC tablet Take 81 mg by mouth daily. Swallow whole.  [name] atorvastatin (LIPITOR) 20 MG tablet Take 1 tablet (20 mg total) by mouth daily.  . Calcium Carbonate-Vit D-Min (CALTRATE 600+D PLUS MINERALS) 600-800 MG-UNIT  TABS Take 1 tablet by mouth in the morning and at bedtime.  . clobetasol (TEMOVATE) 0.05 % external solution Apply 1 application topically 2 (two) times daily as needed (scalp). Reported on 08/16/2015  . fluticasone (FLONASE) 50 MCG/ACT nasal spray SHAKE LIQUID AND USE 2 SPRAYS IN EACH NOSTRIL EVERY DAY  . gabapentin (NEURONTIN) 300 MG capsule Take 1 capsule (300 mg total) by mouth at bedtime.  [name] glucose blood (FREESTYLE LITE) test strip USE TO CHECK BLOOD SUGAR DAILY  . ketoconazole (NIZORAL) 2 % cream APPLY TO THE AFFECTED AREA ON FACE TWICE DAILY UNTIL CLEAR  . loratadine (CLARITIN) 10 MG tablet Take 10 mg by mouth daily.  [name] losartan-hydrochlorothiazide (HYZAAR) 100-25 MG tablet Take 1 tablet by mouth daily.  . metFORMIN (GLUCOPHAGE) 500 MG tablet TAKE 1 TABLET(500 MG) BY MOUTH TWICE DAILY WITH A MEAL  . Multiple Vitamins-Minerals (CENTRUM SILVER 50+WOMEN) TABS Take 1 tablet by mouth daily.  Vladimir Faster Glycol-Propyl Glycol (SYSTANE OP) Apply to eye.   Immunization History  Administered Date(s) Administered  . Fluad Quad(high Dose 65+) 04/30/2019, 05/12/2020  . Hep A / Hep B 05/29/2009, 07/03/2009, 11/24/2009  . Influenza, High Dose Seasonal PF 04/18/2014, 04/13/2015, 04/30/2018  . Influenza, Seasonal, Injecte, Preservative Fre 06/26/2016  . Influenza-Unspecified 05/17/2009, 04/02/2011, 05/20/2012, 06/02/2013, 07/01/2013, 04/18/2014, 04/13/2015, 06/27/2016, 04/11/2017  . PFIZER SARS-COV-2 Vaccination 10/06/2019, 10/27/2019, 04/24/2020  . Pneumococcal Conjugate-13 04/18/2014  . Pneumococcal Polysaccharide-23 10/06/2015  . Td 05/17/2009, 05/12/2020  . Typhoid Inactivated 05/17/2009  . Yellow Fever 05/17/2009  . Zoster  04/02/2011       Objective:   Physical Exam BP 126/78 (BP Location: Left Arm, Cuff Size: Normal)   Pulse 87   Temp (!) 97 F (36.1 C) (Temporal)   Ht _0  (1.575 m)   Wt 163 lb 9.6 oz (74.2 kg)   LMP 08/26/1992 (Approximate)   SpO2 96%   BMI 29.92 kg/m    GENERAL: Awake, alert, fully ambulatory, no acute distress. HEAD: Normocephalic, atraumatic.  EYES: Pupils equal, round, reactive to light.  No scleral icterus.  MOUTH: Nose/mouth/throat not examined due to masking requirements for COVID 19. NECK: Supple. No thyromegaly. Trachea midline. No JVD.  No adenopathy. PULMONARY: Lungs clear to auscultation bilaterally. CARDIOVASCULAR: S1 and S2. Regular rate and rhythm.  No rubs murmurs or gallops heard. GASTROINTESTINAL: Benign. MUSCULOSKELETAL: No joint deformity, no clubbing, no edema.  NEUROLOGIC: Awake, alert, no focal deficits.  Speech is fluent.  No gait disturbance noted, fully ambulatory. SKIN: Intact,warm,dry. PSYCH: Mood and behavior appropriate     Assessment & Plan:     ICD-10-CM   1. Bronchiectasis without complication ([place])  D73.2    She has had some increasing cough May be related to bronchiectasis or to medications Trial of Arnuity Ellipta as anti-inflammatory  2. Cough  R05.9    Arnuity as above Known side effect of Arimidex  3. Pulmonary hypertension (HCC) - mild  I27.20    This is mild Does have obstructive sleep apnea Started therapy with CPAP  4. OSA (obstructive sleep apnea)  G47.33    CPAP AutoSet    Meds ordered this encounter  Medications  . Fluticasone Furoate (ARNUITY ELLIPTA) 100 MCG/ACT AEPB    Sig: Inhale 1 puff into the lungs daily.    Dispense:  30 each    Refill:  6    Discussion:  Patient has had some increasing cough very related to side effect from Arimidex, she does however have some issues with bronchiectasis and mucous plugging will start trial of Arnuity Ellipta as anti-inflammatory.  We will see her in follow-up in 3 months time she is to contact us prior to that time should any new difficulties arise.  Renold Don, MD [facility]   *This note was dictated using voice recognition software/Dragon.  Despite best efforts to proofread, errors can occur which can change the  meaning.  Any change was purely unintentional.

## 2020-06-28 NOTE — Patient Instructions (Signed)
We are giving you a trial of Arnuity Ellipta 1 inhalation daily.  Make sure you rinse your mouth well after you use it.  We will see you in follow-up in 3 months time call sooner should any new problems arise.

## 2020-06-29 ENCOUNTER — Ambulatory Visit (INDEPENDENT_AMBULATORY_CARE_PROVIDER_SITE_OTHER): Payer: Medicare Other | Admitting: Cardiology

## 2020-06-29 ENCOUNTER — Encounter: Payer: Self-pay | Admitting: Cardiology

## 2020-06-29 VITALS — BP 148/68 | HR 75 | Ht 62.0 in | Wt 164.0 lb

## 2020-06-29 DIAGNOSIS — I2510 Atherosclerotic heart disease of native coronary artery without angina pectoris: Secondary | ICD-10-CM | POA: Diagnosis not present

## 2020-06-29 DIAGNOSIS — E7800 Pure hypercholesterolemia, unspecified: Secondary | ICD-10-CM | POA: Diagnosis not present

## 2020-06-29 DIAGNOSIS — R079 Chest pain, unspecified: Secondary | ICD-10-CM

## 2020-06-29 DIAGNOSIS — I10 Essential (primary) hypertension: Secondary | ICD-10-CM | POA: Diagnosis not present

## 2020-06-29 MED ORDER — FENOFIBRATE 145 MG PO TABS: 145.0000 mg | ORAL_TABLET | Freq: Every day | ORAL | 5 refills | Status: DC

## 2020-06-29 NOTE — Patient Instructions (Signed)
Medication Instructions:   Your physician has recommended you make the following change in your medication:   1.  START taking fenofibrate (TRICOR) 145 MG tablet:  Take 1 tablet (145 mg total) by mouth daily.  *If you need a refill on your cardiac medications before your next appointment, please call your pharmacy*   Lab Work: None Ordered If you have labs (blood work) drawn today and your tests are completely normal, you will receive your results only by: [name] MyChart Message (if you have MyChart) OR . A paper copy in the mail If you have any lab test that is abnormal or we need to change your treatment, we will call you to review the results.   Testing/Procedures:  [place]       Your caregiver has ordered a Stress Test with nuclear imaging. The purpose of this test is to evaluate the blood supply to your heart muscle. This procedure is referred to as a "Non-Invasive Stress Test." This is because other than having an IV started in your vein, nothing is inserted or "invades" your body. Cardiac stress tests are done to find areas of poor blood flow to the heart by determining the extent of coronary artery disease (CAD). Some patients exercise on a treadmill, which naturally increases the blood flow to your heart, while others who are  unable to walk on a treadmill due to physical limitations have a pharmacologic/chemical stress agent called Lexiscan . This medicine will mimic walking on a treadmill by temporarily increasing your coronary blood flow.      PLEASE REPORT TO [facility] MEDICAL MALL ENTRANCE   THE VOLUNTEERS AT THE FIRST DESK WILL DIRECT YOU WHERE TO GO     *Please note: these test may take anywhere between 2-4 hours to complete       Date of Procedure:_____________________________________   Arrival Time for Procedure:______________________________    PLEASE NOTIFY THE OFFICE AT LEAST 24 HOURS IN ADVANCE IF YOU ARE UNABLE TO KEEP YOUR APPOINTMENT.  [place] 24 HOURS IN ADVANCE IF YOU ARE UNABLE TO KEEP YOUR APPOINTMENT. [phone]       How to prepare for your Myoview test:         _XX___:  Hold diabetes medication the morning of procedure: metFORMIN (GLUCOPHAGE) 500 MG tablet    1. Do not eat or drink after midnight  2. No caffeine for 24 hours prior to test  3. No smoking 24 hours prior to test.  4. Unless instructed otherwise, Take your medication with a small sips of water.    5.         Ladies, please do not wear dresses. Skirts or pants are appropriate. Please wear a short sleeve shirt.  6. No perfume, cologne or lotion.  7. Wear comfortable walking shoes. No heels!     Follow-Up: At [facility], you and your health needs are our priority.  As part of our continuing mission to provide you with exceptional heart care, we have created designated Provider Care Teams.  These Care Teams include your primary Cardiologist (physician) and Advanced Practice Providers (APPs -  Physician Assistants and Nurse Practitioners) who all work together to provide you with the care you need, when you need it.  We recommend signing up for the patient portal called "MyChart".  Sign up information is provided on this After Visit Summary.  MyChart is used to connect with patients for Virtual Visits (Telemedicine).  Patients  are able to view lab/test results, encounter notes, upcoming appointments, etc.  Non-urgent messages can be sent to your provider as well.   To learn more about what you can do with MyChart, go to [email or web address].    Your next appointment:   Follow up after Myoview   The format for your next appointment:   In Person  Provider:   Kate Sable, MD   Other Instructions  Fenofibrate capsules What is this medicine? FENOFIBRATE (fen oh FYE brate) capsules can help lower blood fats and cholesterol for people who are at risk of getting inflammation of the pancreas  (pancreatitis) from having very high amounts of fats in their blood. This medicine is only for patients whose blood fats are not controlled by diet. This medicine may be used for other purposes; ask your health care provider or pharmacist if you have questions. COMMON BRAND NAME(S): Lipofen What should I tell my health care provider before I take this medicine? They need to know if you have any of these conditions:  gallbladder disease  heart disease  kidney disease  liver disease  an unusual or allergic reaction to fenofibrate, gemfibrozil, other medicines, foods, dyes, or preservatives  pregnant or trying to get pregnant  breast-feeding How should I use this medicine? Take this medicine by mouth with a glass of water. Follow the directions on the prescription label. Take with food. Take your medicine at regular intervals. Do not take it more often than directed. Do not stop taking except on your doctor's advice. Talk to your pediatrician regarding the use of this medicine in children. Special care may be needed. Overdosage: If you think you have taken too much of this medicine contact a poison control center or emergency room at once. NOTE: This medicine is only for you. Do not share this medicine with others. What if I miss a dose? If you miss a dose, take it as soon as you can. If it is almost time for your next dose, take only that dose. Do not take double or extra doses. What may interact with this medicine? This medicine may interact with the following medications:  bile acid resins like cholestyramine, colesevelam, and colestipol  certain medicines for cholesterol like atorvastatin, lovastatin, and simvastatin  certain medicines for diabetes, like glipizide or glyburide  certain medicines that suppress the body's immune response like cyclosporine and tacrolimus  certain medicines that treat or prevent blood clots like warfarin  colchicine  ezetimibe  supplements  like red yeast rice This list may not describe all possible interactions. Give your health care provider a list of all the medicines, herbs, non-prescription drugs, or dietary supplements you use. Also tell them if you smoke, drink alcohol, or use illegal drugs. Some items may interact with your medicine. What should I watch for while using this medicine? Visit your doctor or healthcare provider for regular checks on your progress. Your blood fat levels and other tests will be measured from time to time. This medicine may cause serious skin reactions. They can happen weeks to months after starting the medicine. Contact your healthcare provider right away if you notice fevers or flu-like symptoms with a rash. The rash may be red or purple and then turn into blisters or peeling of the skin. Or, you might notice a red rash with swelling of the face, lips, or lymph nodes in your neck or under your arms. This medicine is only part of a total cholesterol-lowering program. Your healthcare provider or dietician can  suggest a low-cholesterol and low-fat diet that will reduce your risk of getting heart and blood vessel disease. Avoid alcohol and smoking, and keep a proper exercise schedule. If you are diabetic, close regulation and monitoring of your blood sugars can help your blood fat levels. This medicine may change the way your diabetic medicine works and sometimes will require that your dosages be adjusted. Check with your doctor or healthcare provider. This medicine can make you more sensitive to the sun. Keep out of the sun. If you cannot avoid being in the sun, wear protective clothing and use sunscreen. Do not use sun lamps or tanning beds/booths. You may get drowsy or dizzy. Do not drive, use machinery, or do anything that needs mental alertness until you know how this drug affects you. Do not stand or sit up quickly, especially if you are an older patient. This reduces the risk of dizzy or fainting  spells. This medicine may cause a decrease in vitamin B12. You should make sure that you get enough vitamin B12 while you are taking this medicine. Discuss the foods you eat and the vitamins you take with your healthcare provider. What side effects may I notice from receiving this medicine? Side effects that you should report to your doctor or health care professional as soon as possible:  allergic reactions like skin rash, itching or hives, swelling of the face, lips, or tongue  blurred vision  bruising  rash, fever, and swollen lymph nodes  redness, blistering, peeling, or loosening of the skin, including inside the mouth  signs and symptoms of infection like fever or chills, cough, or sore throat  signs and symptoms of muscle injury like dark urine, trouble passing urine or change in the amount of urine, unusually weak or tired, muscle pain, or side or back pain  stomach pain  yellowing of the eyes or skin Side effects that usually do not require medical attention (report to your doctor or health care professional if they continue or are bothersome):  constipation  diarrhea  dizziness  headache  nausea, vomiting This list may not describe all possible side effects. Call your doctor for medical advice about side effects. You may report side effects to FDA at 1-800-FDA-1088. Where should I keep my medicine? Keep out of the reach of children. Store at room temperature between 15 and 30 degrees C (59 and 86 degrees F). Keep container tightly closed. Throw away any unused medicine after the expiration date. NOTE: This sheet is a summary. It may not cover all possible information. If you have questions about this medicine, talk to your doctor, pharmacist, or health care provider.  2020 [employer]vier/Gold Standard (2018-11-12 14:27:01)

## 2020-06-29 NOTE — Progress Notes (Signed)
Cardiology Office Note:    Date:  06/29/2020   ID:  Katie [name], DOB 12/08/1945, MRN [id number]  PCP:  [name], MD  Cardiologist:  Kate Sable, MD  Electrophysiologist:  None   Referring MD: [name], MD   Chief Complaint  Patient presents with  . Follow-up    6 Months follow up. Patient c/o chest pains but just began using CPAP machine. Medications verbally reviewed with patient.     History of Present Illness:    Katie [name] is a 74 y.o. female with a hx of CAD ( chest CT 11/2019-calcified plaque in [facility]), hypertension, hyperlipidemia, diabetes, breast cancer status post bilateral mastectomy, chemo, radiation and immunotherapy who presents for follow-up.   Patient had an episode of chest discomfort while moving furniture over the weekend.  Was diagnosed with sleep apnea, started using CPAP mask 3 days ago.  Has noticed chest discomfort which she calls pressure starting CPAP mask.  Had a recent cholesterol blood work 6 days ago.  Has been on anastrozole for over a year.  She thinks her symptoms could be scar tissue related from prior breasts cancer surgery and radiation.   Prior notes patient s/p chemotherapy for breast cancer.  She had a chest CT to evaluate for the presence of any residual disease. Chest CT obtained on 12/08/2019 showed calcified plaque in the left main, LAD and RCA. echocardiogram 08/2019  showed normal systolic function, EF 60 to 65%.  Past Medical History:  Diagnosis Date  . Allergy   . Anemia   . 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 Blood transfusion without reported diagnosis   . Cancer ([place])    breast 08/2018  . Coronary artery disease   . Diverticulitis   . DJD (degenerative joint disease)   . DM type 2 (diabetes mellitus, type 2) ([place])   . Family history of breast cancer   . Fibrocystic breast disease   . GERD (gastroesophageal reflux disease)   . History of chicken pox   . Hypercholesteremia    . Hypertension   . Lichen planopilaris   . Urinary incontinence   . Vertigo 12/2019   1 episode    Past Surgical History:  Procedure Laterality Date  . BREAST IMPLANT REMOVAL Bilateral 03/08/2019   Procedure: Removal of Bilateral Breast Implants;  Surgeon: Rolm Bookbinder, MD;  Location: [place];  Service: General;  Laterality: Bilateral;  . BREAST SURGERY  02/1975   Left Breast Biopsy-Fibrocystic Disease  . BREAST SURGERY  10/1981   Bialteral Capsulectomy Breast Surgery  . BREAST SURGERY  07/1998   Replace Bilateral Silicone Implants  . BREAST SURGERY  01/1976   Bilater breast surgery to remove fibrocystic tissue and replace with silicone implants  . CATARACT EXTRACTION W/PHACO Right 02/08/2020   Procedure: CATARACT EXTRACTION PHACO AND INTRAOCULAR LENS PLACEMENT ([place]) RIGHT DIABETIC;  Surgeon: Birder [name], MD;  Location: [place];  Service: Ophthalmology;  Laterality: Right;  3.55 0:33.7  . CATARACT EXTRACTION W/PHACO Left 02/29/2020   Procedure: CATARACT EXTRACTION PHACO AND INTRAOCULAR LENS PLACEMENT ([facility]) LEFT DIABETIC 2.93  00:27.1;  Surgeon: Birder [name], MD;  Location: [place];  Service: Ophthalmology;  Laterality: Left;  Diabetic - oral meds  . CHOLECYSTECTOMY N/A 07/08/2016   Procedure: LAPAROSCOPIC CHOLECYSTECTOMY;  Surgeon: [name], MD;  Location: [facility];  Service: General;  Laterality: N/A;  . COLONOSCOPY WITH PROPOFOL N/A 11/07/2017   Procedure: COLONOSCOPY WITH PROPOFOL;  Surgeon:  Jonathon Bellows, MD;  Location: [facility] END[facility]COPY;  Service: Gastroenterology;  Laterality: N/A;  . DILATION AND CURETTAGE OF UTERUS  01/1994  . ESOPHAGOGASTRODUODENOSCOPY (EGD) WITH PROPOFOL N/A 12/05/2016   Procedure: ESOPHAGOGASTRODUODENOSCOPY (EGD) WITH PROPOFOL;  Surgeon: Jonathon Bellows, MD;  Location: [facility] ENDOSCOPY;  Service: Endoscopy;  Laterality: N/A;  . FINGER SURGERY  2001 & 2003   Trigger Finger  . FOOT SURGERY  1980   To Relieve Pinched Nerve  .  FOOT SURGERY  07/2008   Left Foot-Plantar Fasciitis  . JOINT REPLACEMENT  04/19/2013   Hip Replacement-Right  . MASTECTOMY W/ SENTINEL NODE BIOPSY Bilateral 03/08/2019   Procedure: BILATERAL MASTECTOMIES WITH LEFT AXILLARY SENTINEL LYMPH NODE BIOPSY AND BLUE DYE INJECTION;  Surgeon: Rolm Bookbinder, MD;  Location: [place];  Service: General;  Laterality: Bilateral;  . PARTIAL HIP ARTHROPLASTY Right   . PLACEMENT OF BREAST IMPLANTS  12/26/2004   Replace Failed Implants  . REPLACEMENT TOTAL KNEE Left    2019-  . RHINOPLASTY  03/1964   To Correct Deviated Septum    Current Medications: Current Meds  Medication Sig  . anastrozole (ARIMIDEX) 1 MG tablet TAKE 1 TABLET(1 MG) BY MOUTH DAILY  . aspirin 81 MG EC tablet Take 81 mg by mouth daily. Swallow whole.  [name] atorvastatin (LIPITOR) 20 MG tablet Take 1 tablet (20 mg total) by mouth daily.  . Calcium Carbonate-Vit D-Min (CALTRATE 600+D PLUS MINERALS) 600-800 MG-UNIT TABS Take 1 tablet by mouth in the morning and at bedtime.  . clobetasol (TEMOVATE) 0.05 % external solution Apply 1 application topically 2 (two) times daily as needed (scalp). Reported on 08/16/2015  . fluticasone (FLONASE) 50 MCG/ACT nasal spray SHAKE LIQUID AND USE 2 SPRAYS IN EACH NOSTRIL EVERY DAY  . Fluticasone Furoate (ARNUITY ELLIPTA) 100 MCG/ACT AEPB Inhale 1 puff into the lungs daily.  [name] gabapentin (NEURONTIN) 300 MG capsule Take 1 capsule (300 mg total) by mouth at bedtime.  [name] glucose blood (FREESTYLE LITE) test strip USE TO CHECK BLOOD SUGAR DAILY  . ketoconazole (NIZORAL) 2 % cream APPLY TO THE AFFECTED AREA ON FACE TWICE DAILY UNTIL CLEAR  . loratadine (CLARITIN) 10 MG tablet Take 10 mg by mouth daily.  [name] losartan-hydrochlorothiazide (HYZAAR) 100-25 MG tablet Take 1 tablet by mouth daily.  . metFORMIN (GLUCOPHAGE) 500 MG tablet TAKE 1 TABLET(500 MG) BY MOUTH TWICE DAILY WITH A MEAL  . Multiple Vitamins-Minerals (CENTRUM SILVER 50+WOMEN) TABS Take 1 tablet by mouth  daily.  Vladimir Faster Glycol-Propyl Glycol (SYSTANE OP) Apply to eye.     Allergies:   Erythromycin, Ciprofloxacin, Daypro [oxaprozin], Enalapril maleate, Nsaids, and Vioxx [rofecoxib]   Social History   Socioeconomic History  . Marital status: Married    Spouse name: Not on file  . Number of children: 1  . Years of education: Not on file  . Highest education level: Not on file  Occupational History  . Occupation: Retired  Tobacco Use  . Smoking status: Never Smoker  . Smokeless tobacco: Never Used  Vaping Use  . Vaping Use: Never used  Substance and Sexual Activity  . Alcohol use: Not Currently    Alcohol/week: 0.0 standard drinks    Comment: rarely  . Drug use: No  . Sexual activity: Not Currently    Partners: Male  Other Topics Concern  . Not on file  Social History Narrative   Married for 37 years.    She has one child and one stepston.      Social Determinants of Health  Financial Resource Strain: Low Risk   . Difficulty of Paying Living Expenses: Not hard at all  Food Insecurity: No Food Insecurity  . Worried About Charity fundraiser in the Last Year: Never true  . Ran Out of Food in the Last Year: Never true  Transportation Needs: No Transportation Needs  . Lack of Transportation (Medical): No  . Lack of Transportation (Non-Medical): No  Physical Activity: Sufficiently Active  . Days of Exercise per Week: 3 days  . Minutes of Exercise per Session: 60 min  Stress: No Stress Concern Present  . Feeling of Stress : Not at all  Social Connections:   . Frequency of Communication with Friends and Family: Not on file  . Frequency of Social Gatherings with Friends and Family: Not on file  . Attends Religious Services: Not on file  . Active Member of Clubs or Organizations: Not on file  . Attends Archivist Meetings: Not on file  . Marital Status: Not on file     Family History: The patient's family history includes Arthritis in her mother; Breast  cancer (age of onset: 26) in her sister; Coronary artery disease in her brother; Diabetes in her father; Heart attack in her brother and father; Heart disease in her father; Hypertension in her brother and mother; Leukemia in her brother.  ROS:   Please see the history of present illness.     All other systems reviewed and are negative.  EKGs/Labs/Other Studies Reviewed:    The following studies were reviewed today:   EKG:  EKG is  ordered today.  The ekg ordered today demonstrates normal sinus rhythm  Recent Labs: 01/27/2020: Hemoglobin 11.9; Platelet Count 130 05/02/2020: ALT 22; BUN 22; Creatinine, Ser 0.98; Potassium 4.2; Sodium 138  Recent Lipid Panel    Component Value Date/Time   CHOL 169 06/23/2020 0813   TRIG 283 (H) 06/23/2020 0813   HDL 37 (L) 06/23/2020 0813   CHOLHDL 4.6 (H) 06/23/2020 0813   CHOLHDL 4 05/02/2020 0736   VLDL 73.8 (H) 05/02/2020 0736   LDLCALC 85 06/23/2020 0813   LDLDIRECT 57.0 05/02/2020 0736    Physical Exam:    VS:  BP (!) 148/68 (BP Location: Left Arm, Patient Position: Sitting, Cuff Size: Normal)   Pulse 75   Ht 5\' 2"  (1.575 m)   Wt 164 lb (74.4 kg)   LMP 08/26/1992 (Approximate)   SpO2 98%   BMI 30.00 kg/m     Wt Readings from Last 3 Encounters:  06/29/20 164 lb (74.4 kg)  06/28/20 163 lb 9.6 oz (74.2 kg)  05/12/20 161 lb 4 oz (73.1 kg)     GEN:  Well nourished, well developed in no acute distress HEENT: Normal NECK: No JVD; No carotid bruits LYMPHATICS: No lymphadenopathy CARDIAC: RRR, no murmurs, rubs, gallops RESPIRATORY:  Clear to auscultation without rales, wheezing or rhonchi  ABDOMEN: Soft, non-tender, non-distended MUSCULOSKELETAL:  No edema; No deformity  SKIN: Warm and dry NEUROLOGIC:  Alert and oriented x 3 PSYCHIATRIC:  Normal affect   ASSESSMENT:    1. Coronary artery disease involving native heart, unspecified vessel or lesion type, unspecified whether angina present   2. Essential hypertension   3. Pure  hypercholesterolemia   4. Chest pain of uncertain etiology    PLAN:    In order of problems listed above:  1. Chest pain, history of CAD, calcified plaque noted in the LM, LAD, RCA on  CT scan.  Will get Lexiscan Myoview to evaluate  for ischemia.  If Lexiscan is normal and chest pain continues, will consider antianginal medication such as Imdur.  Continue aspirin 81 mg, increase Lipitor to 20 mg daily.  Of note, anastrozole has been known to cause angina and abnormal lipid metabolism. 2. History of hypertension, BP elevated today, usually well controlled.  Continue current BP meds.  Patient may have a component of whitecoat syndrome 3. History of hyperlipidemia, last LDL 85, total cholesterol 169, triglycerides improved but still elevated at 283.  Continue Lipitor 20, start fenofibrate.  Follow-up after Myoview  This note was generated in part or whole with voice recognition software. Voice recognition is usually quite accurate but there are transcription errors that can and very often do occur. I apologize for any typographical errors that were not detected and corrected.  Medication Adjustments/Labs and Tests Ordered: Current medicines are reviewed at length with the patient today.  Concerns regarding medicines are outlined above.  Orders Placed This Encounter  Procedures  . NM Myocar Multi W/Spect W/Wall Motion / EF  . EKG 12-Lead   Meds ordered this encounter  Medications  . fenofibrate (TRICOR) 145 MG tablet    Sig: Take 1 tablet (145 mg total) by mouth daily.    Dispense:  30 tablet    Refill:  5    Patient Instructions  Medication Instructions:   Your physician has recommended you make the following change in your medication:   1.  START taking fenofibrate (TRICOR) 145 MG tablet:  Take 1 tablet (145 mg total) by mouth daily.  *If you need a refill on your cardiac medications before your next appointment, please call your pharmacy*   Lab Work: None Ordered If you have  labs (blood work) drawn today and your tests are completely normal, you will receive your results only by: [name] MyChart Message (if you have MyChart) OR . A paper copy in the mail If you have any lab test that is abnormal or we need to change your treatment, we will call you to review the results.   Testing/Procedures:  [place]       Your caregiver has ordered a Stress Test with nuclear imaging. The purpose of this test is to evaluate the blood supply to your heart muscle. This procedure is referred to as a "Non-Invasive Stress Test." This is because other than having an IV started in your vein, nothing is inserted or "invades" your body. Cardiac stress tests are done to find areas of poor blood flow to the heart by determining the extent of coronary artery disease (CAD). Some patients exercise on a treadmill, which naturally increases the blood flow to your heart, while others who are  unable to walk on a treadmill due to physical limitations have a pharmacologic/chemical stress agent called Lexiscan . This medicine will mimic walking on a treadmill by temporarily increasing your coronary blood flow.      PLEASE REPORT TO [facility] MEDICAL MALL ENTRANCE   THE VOLUNTEERS AT THE FIRST DESK WILL DIRECT YOU WHERE TO GO     *Please note: these test may take anywhere between 2-4 hours to complete       Date of Procedure:_____________________________________   Arrival Time for Procedure:______________________________    PLEASE NOTIFY THE OFFICE AT LEAST 24 HOURS IN ADVANCE IF YOU ARE UNABLE TO KEEP YOUR APPOINTMENT.  [place] 24 HOURS IN ADVANCE IF YOU ARE UNABLE TO KEEP YOUR APPOINTMENT. [phone]  How to prepare for your Myoview test:         _XX___:  Hold diabetes medication the morning of procedure: metFORMIN (GLUCOPHAGE) 500 MG tablet    1. Do not eat or drink after midnight  2. No caffeine for 24 hours prior to test   3. No smoking 24 hours prior to test.  4. Unless instructed otherwise, Take your medication with a small sips of water.    5.         Ladies, please do not wear dresses. Skirts or pants are appropriate. Please wear a short sleeve shirt.  6. No perfume, cologne or lotion.  7. Wear comfortable walking shoes. No heels!     Follow-Up: At [facility], you and your health needs are our priority.  As part of our continuing mission to provide you with exceptional heart care, we have created designated Provider Care Teams.  These Care Teams include your primary Cardiologist (physician) and Advanced Practice Providers (APPs -  Physician Assistants and Nurse Practitioners) who all work together to provide you with the care you need, when you need it.  We recommend signing up for the patient portal called "MyChart".  Sign up information is provided on this After Visit Summary.  MyChart is used to connect with patients for Virtual Visits (Telemedicine).  Patients are able to view lab/test results, encounter notes, upcoming appointments, etc.  Non-urgent messages can be sent to your provider as well.   To learn more about what you can do with MyChart, go to [email or web address].    Your next appointment:   Follow up after Myoview   The format for your next appointment:   In Person  Provider:   Kate Sable, MD   Other Instructions  Fenofibrate capsules What is this medicine? FENOFIBRATE (fen oh FYE brate) capsules can help lower blood fats and cholesterol for people who are at risk of getting inflammation of the pancreas (pancreatitis) from having very high amounts of fats in their blood. This medicine is only for patients whose blood fats are not controlled by diet. This medicine may be used for other purposes; ask your health care provider or pharmacist if you have questions. COMMON BRAND NAME(S): Lipofen What should I tell my health care provider before I take this medicine? They  need to know if you have any of these conditions:  gallbladder disease  heart disease  kidney disease  liver disease  an unusual or allergic reaction to fenofibrate, gemfibrozil, other medicines, foods, dyes, or preservatives  pregnant or trying to get pregnant  breast-feeding How should I use this medicine? Take this medicine by mouth with a glass of water. Follow the directions on the prescription label. Take with food. Take your medicine at regular intervals. Do not take it more often than directed. Do not stop taking except on your doctor's advice. Talk to your pediatrician regarding the use of this medicine in children. Special care may be needed. Overdosage: If you think you have taken too much of this medicine contact a poison control center or emergency room at once. NOTE: This medicine is only for you. Do not share this medicine with others. What if I miss a dose? If you miss a dose, take it as soon as you can. If it is almost time for your next dose, take only that dose. Do not take double or extra doses. What may interact with this medicine? This medicine may interact with the following medications:  bile acid resins like  cholestyramine, colesevelam, and colestipol  certain medicines for cholesterol like atorvastatin, lovastatin, and simvastatin  certain medicines for diabetes, like glipizide or glyburide  certain medicines that suppress the body's immune response like cyclosporine and tacrolimus  certain medicines that treat or prevent blood clots like warfarin  colchicine  ezetimibe  supplements like red yeast rice This list may not describe all possible interactions. Give your health care provider a list of all the medicines, herbs, non-prescription drugs, or dietary supplements you use. Also tell them if you smoke, drink alcohol, or use illegal drugs. Some items may interact with your medicine. What should I watch for while using this medicine? Visit your  doctor or healthcare provider for regular checks on your progress. Your blood fat levels and other tests will be measured from time to time. This medicine may cause serious skin reactions. They can happen weeks to months after starting the medicine. Contact your healthcare provider right away if you notice fevers or flu-like symptoms with a rash. The rash may be red or purple and then turn into blisters or peeling of the skin. Or, you might notice a red rash with swelling of the face, lips, or lymph nodes in your neck or under your arms. This medicine is only part of a total cholesterol-lowering program. Your healthcare provider or dietician can suggest a low-cholesterol and low-fat diet that will reduce your risk of getting heart and blood vessel disease. Avoid alcohol and smoking, and keep a proper exercise schedule. If you are diabetic, close regulation and monitoring of your blood sugars can help your blood fat levels. This medicine may change the way your diabetic medicine works and sometimes will require that your dosages be adjusted. Check with your doctor or healthcare provider. This medicine can make you more sensitive to the sun. Keep out of the sun. If you cannot avoid being in the sun, wear protective clothing and use sunscreen. Do not use sun lamps or tanning beds/booths. You may get drowsy or dizzy. Do not drive, use machinery, or do anything that needs mental alertness until you know how this drug affects you. Do not stand or sit up quickly, especially if you are an older patient. This reduces the risk of dizzy or fainting spells. This medicine may cause a decrease in vitamin B12. You should make sure that you get enough vitamin B12 while you are taking this medicine. Discuss the foods you eat and the vitamins you take with your healthcare provider. What side effects may I notice from receiving this medicine? Side effects that you should report to your doctor or health care professional as  soon as possible:  allergic reactions like skin rash, itching or hives, swelling of the face, lips, or tongue  blurred vision  bruising  rash, fever, and swollen lymph nodes  redness, blistering, peeling, or loosening of the skin, including inside the mouth  signs and symptoms of infection like fever or chills, cough, or sore throat  signs and symptoms of muscle injury like dark urine, trouble passing urine or change in the amount of urine, unusually weak or tired, muscle pain, or side or back pain  stomach pain  yellowing of the eyes or skin Side effects that usually do not require medical attention (report to your doctor or health care professional if they continue or are bothersome):  constipation  diarrhea  dizziness  headache  nausea, vomiting This list may not describe all possible side effects. Call your doctor for medical advice about side  effects. You may report side effects to FDA at 1-800-FDA-1088. Where should I keep my medicine? Keep out of the reach of children. Store at room temperature between 15 and 30 degrees C (59 and 86 degrees F). Keep container tightly closed. Throw away any unused medicine after the expiration date. NOTE: This sheet is a summary. It may not cover all possible information. If you have questions about this medicine, talk to your doctor, pharmacist, or health care provider.  2020 Elsevier/Gold Standard (2018-11-12 14:27:01)     Signed, Kate Sable, MD  06/29/2020 11:45 AM    [place]

## 2020-07-04 DIAGNOSIS — I671 Cerebral aneurysm, nonruptured: Secondary | ICD-10-CM | POA: Diagnosis not present

## 2020-07-14 ENCOUNTER — Other Ambulatory Visit: Payer: Self-pay

## 2020-07-14 ENCOUNTER — Ambulatory Visit
Admission: RE | Admit: 2020-07-14 | Discharge: 2020-07-14 | Disposition: A | Discharge status: Home / Self Care | Payer: Medicare Other | Source: Ambulatory Visit | Attending: Cardiology | Admitting: Cardiology

## 2020-07-14 DIAGNOSIS — R079 Chest pain, unspecified: Secondary | ICD-10-CM | POA: Insufficient documentation

## 2020-07-14 DIAGNOSIS — I2510 Atherosclerotic heart disease of native coronary artery without angina pectoris: Secondary | ICD-10-CM

## 2020-07-14 LAB — NM MYOCAR MULTI W/SPECT W/WALL MOTION / EF
Estimated workload: 1 METS
Exercise duration (min): 0 min
Exercise duration (sec): 0 s
LV dias vol: 31 mL (ref 46–106)
LV sys vol: 9 mL
MPHR: 145 {beats}/min
Peak HR: 99 {beats}/min
Percent HR: 66 %
Rest HR: 99 {beats}/min
SDS: 1
SRS: 0
SSS: 0
TID: 0.9

## 2020-07-14 MED ORDER — TECHNETIUM TC 99M TETROFOSMIN IV KIT
10.0000 | PACK | Freq: Once | INTRAVENOUS | Status: AC | PRN
  Administered 2020-07-14: 10.15 via INTRAVENOUS

## 2020-07-14 MED ORDER — REGADENOSON 0.4 MG/5ML IV SOLN
0.4000 mg | Freq: Once | INTRAVENOUS | Status: AC
  Administered 2020-07-14: 0.4 mg via INTRAVENOUS

## 2020-07-14 MED ORDER — TECHNETIUM TC 99M TETROFOSMIN IV KIT
31.6300 | PACK | Freq: Once | INTRAVENOUS | Status: AC | PRN
  Administered 2020-07-14: 31.63 via INTRAVENOUS

## 2020-07-17 ENCOUNTER — Ambulatory Visit (INDEPENDENT_AMBULATORY_CARE_PROVIDER_SITE_OTHER): Payer: Medicare Other | Admitting: Cardiology

## 2020-07-17 ENCOUNTER — Other Ambulatory Visit: Payer: Self-pay

## 2020-07-17 ENCOUNTER — Encounter: Payer: Self-pay | Admitting: Cardiology

## 2020-07-17 VITALS — BP 146/62 | HR 71 | Ht 62.0 in | Wt 163.5 lb

## 2020-07-17 DIAGNOSIS — E7800 Pure hypercholesterolemia, unspecified: Secondary | ICD-10-CM | POA: Diagnosis not present

## 2020-07-17 DIAGNOSIS — I2510 Atherosclerotic heart disease of native coronary artery without angina pectoris: Secondary | ICD-10-CM | POA: Diagnosis not present

## 2020-07-17 DIAGNOSIS — I10 Essential (primary) hypertension: Secondary | ICD-10-CM

## 2020-07-17 NOTE — Patient Instructions (Signed)
Medication Instructions:   Your physician recommends that you continue on your current medications as directed. Please refer to the Current Medication list given to you today.  *If you need a refill on your cardiac medications before your next appointment, please call your pharmacy*   Lab Work:  Your physician recommends that you return for a FASTING lipid profile: the week prior to your 3 month follow up.  - You will need to be fasting. Please do not have anything to eat or drink after midnight the morning you have the lab work. You may only have water or black coffee with no cream or sugar.  - Please go to the [facility]. You will check in at the front desk to the right as you walk into the atrium. Valet Parking is offered if needed. - No appointment needed. You may go any day between 7 am and 6 pm.   Testing/Procedures: None Ordered   Follow-Up: At [facility], you and your health needs are our priority.  As part of our continuing mission to provide you with exceptional heart care, we have created designated Provider Care Teams.  These Care Teams include your primary Cardiologist (physician) and Advanced Practice Providers (APPs -  Physician Assistants and Nurse Practitioners) who all work together to provide you with the care you need, when you need it.  We recommend signing up for the patient portal called "MyChart".  Sign up information is provided on this After Visit Summary.  MyChart is used to connect with patients for Virtual Visits (Telemedicine).  Patients are able to view lab/test results, encounter notes, upcoming appointments, etc.  Non-urgent messages can be sent to your provider as well.   To learn more about what you can do with MyChart, go to [email or web address].    Your next appointment:   3 month(s)  The format for your next appointment:   In Person  Provider:   Kate Sable, MD   Other Instructions

## 2020-07-17 NOTE — Progress Notes (Signed)
Cardiology Office Note:    Date:  07/17/2020   ID:  Katie [name], DOB [birth year], MRN [id number]  PCP:  [name], MD  Cardiologist:  Kate Sable, MD  Electrophysiologist:  None   Referring MD: [name], MD   Chief Complaint  Patient presents with  . other    Post stress test no complaints today. Meds reviewed verbally with pt.    History of Present Illness:    Katie [name] is a 74 y.o. female with a hx of CAD ( chest CT 11/2019-calcified plaque in [facility]), hypertension, hyperlipidemia, diabetes, breast cancer (s/p bilateral mastectomy, chemo, radiation and immunotherapy) who presents for follow-up.   Recently seen for chest discomfort associated with exertion.  Lexiscan Myoview was performed to evaluate presence of ischemia.  Has coronary calcifications noted on prior chest CT.  No new complaints today.  States prior symptoms of chest pain have resolved.  Prior notes patient s/p chemotherapy for breast cancer.  She had a chest CT to evaluate for the presence of any residual disease. Chest CT obtained on 12/08/2019 showed calcified plaque in the left main, LAD and RCA. echocardiogram 08/2019  showed normal systolic function, EF 60 to 65%.  Past Medical History:  Diagnosis Date  . Allergy   . Anemia   . 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 Blood transfusion without reported diagnosis   . Cancer ([place])    breast 08/2018  . Coronary artery disease   . Diverticulitis   . DJD (degenerative joint disease)   . DM type 2 (diabetes mellitus, type 2) ([place])   . Family history of breast cancer   . Fibrocystic breast disease   . GERD (gastroesophageal reflux disease)   . History of chicken pox   . Hypercholesteremia   . Hypertension   . Lichen planopilaris   . Urinary incontinence   . Vertigo 12/2019   1 episode    Past Surgical History:  Procedure Laterality Date  . BREAST IMPLANT REMOVAL Bilateral 03/08/2019   Procedure: Removal  of Bilateral Breast Implants;  Surgeon: Rolm Bookbinder, MD;  Location: [place];  Service: General;  Laterality: Bilateral;  . BREAST SURGERY  02/1975   Left Breast Biopsy-Fibrocystic Disease  . BREAST SURGERY  10/1981   Bialteral Capsulectomy Breast Surgery  . BREAST SURGERY  07/1998   Replace Bilateral Silicone Implants  . BREAST SURGERY  01/1976   Bilater breast surgery to remove fibrocystic tissue and replace with silicone implants  . CATARACT EXTRACTION W/PHACO Right 02/08/2020   Procedure: CATARACT EXTRACTION PHACO AND INTRAOCULAR LENS PLACEMENT ([place]) RIGHT DIABETIC;  Surgeon: Birder [name], MD;  Location: [place];  Service: Ophthalmology;  Laterality: Right;  3.55 0:33.7  . CATARACT EXTRACTION W/PHACO Left 02/29/2020   Procedure: CATARACT EXTRACTION PHACO AND INTRAOCULAR LENS PLACEMENT ([facility]) LEFT DIABETIC 2.93  00:27.1;  Surgeon: Birder [name], MD;  Location: [place];  Service: Ophthalmology;  Laterality: Left;  Diabetic - oral meds  . CHOLECYSTECTOMY N/A 07/08/2016   Procedure: LAPAROSCOPIC CHOLECYSTECTOMY;  Surgeon: [name], MD;  Location: [facility];  Service: General;  Laterality: N/A;  . COLONOSCOPY WITH PROPOFOL N/A 11/07/2017   Procedure: COLONOSCOPY WITH PROPOFOL;  Surgeon: Jonathon Bellows, MD;  Location: [facility];  Service: Gastroenterology;  Laterality: N/A;  . DILATION AND CURETTAGE OF UTERUS  01/1994  . ESOPHAGOGASTRODUODENOSCOPY (EGD) WITH PROPOFOL N/A 12/05/2016   Procedure: ESOPHAGOGASTRODUODENOSCOPY (EGD) WITH PROPOFOL;  Surgeon: Jonathon Bellows, MD;  Location:  [place] [facility];  Service: Endoscopy;  Laterality: N/A;  . FINGER SURGERY  2001 & 2003   Trigger Finger  . FOOT SURGERY  1980   To Relieve Pinched Nerve  . FOOT SURGERY  07/2008   Left Foot-Plantar Fasciitis  . JOINT REPLACEMENT  04/19/2013   Hip Replacement-Right  . MASTECTOMY W/ SENTINEL NODE BIOPSY Bilateral 03/08/2019   Procedure: BILATERAL MASTECTOMIES WITH LEFT AXILLARY  SENTINEL LYMPH NODE BIOPSY AND BLUE DYE INJECTION;  Surgeon: Rolm Bookbinder, MD;  Location: [place];  Service: General;  Laterality: Bilateral;  . PARTIAL HIP ARTHROPLASTY Right   . PLACEMENT OF BREAST IMPLANTS  12/26/2004   Replace Failed Implants  . REPLACEMENT TOTAL KNEE Left    2019-  . RHINOPLASTY  03/1964   To Correct Deviated Septum    Current Medications: Current Meds  Medication Sig  . anastrozole (ARIMIDEX) 1 MG tablet TAKE 1 TABLET(1 MG) BY MOUTH DAILY  . aspirin 81 MG EC tablet Take 81 mg by mouth daily. Swallow whole.  [name] atorvastatin (LIPITOR) 20 MG tablet Take 1 tablet (20 mg total) by mouth daily.  . Calcium Carbonate-Vit D-Min (CALTRATE 600+D PLUS MINERALS) 600-800 MG-UNIT TABS Take 1 tablet by mouth in the morning and at bedtime.  . clobetasol (TEMOVATE) 0.05 % external solution Apply 1 application topically 2 (two) times daily as needed (scalp). Reported on 08/16/2015  . fenofibrate (TRICOR) 145 MG tablet Take 1 tablet (145 mg total) by mouth daily.  . fluticasone (FLONASE) 50 MCG/ACT nasal spray SHAKE LIQUID AND USE 2 SPRAYS IN EACH NOSTRIL EVERY DAY  . Fluticasone Furoate (ARNUITY ELLIPTA) 100 MCG/ACT AEPB Inhale 1 puff into the lungs daily.  [name] gabapentin (NEURONTIN) 300 MG capsule Take 1 capsule (300 mg total) by mouth at bedtime.  [name] glucose blood (FREESTYLE LITE) test strip USE TO CHECK BLOOD SUGAR DAILY  . loratadine (CLARITIN) 10 MG tablet Take 10 mg by mouth daily.  [name] losartan-hydrochlorothiazide (HYZAAR) 100-25 MG tablet Take 1 tablet by mouth daily.  . metFORMIN (GLUCOPHAGE) 500 MG tablet TAKE 1 TABLET(500 MG) BY MOUTH TWICE DAILY WITH A MEAL  . Multiple Vitamins-Minerals (CENTRUM SILVER 50+WOMEN) TABS Take 1 tablet by mouth daily.  Vladimir Faster Glycol-Propyl Glycol (SYSTANE OP) Apply to eye.     Allergies:   Erythromycin, Ciprofloxacin, Daypro [oxaprozin], Enalapril maleate, Nsaids, and Vioxx [rofecoxib]   Social History   Socioeconomic History  .  Marital status: Married    Spouse name: Not on file  . Number of children: 1  . Years of education: Not on file  . Highest education level: Not on file  Occupational History  . Occupation: Retired  Tobacco Use  . Smoking status: Never Smoker  . Smokeless tobacco: Never Used  Vaping Use  . Vaping Use: Never used  Substance and Sexual Activity  . Alcohol use: Not Currently    Alcohol/week: 0.0 standard drinks    Comment: rarely  . Drug use: No  . Sexual activity: Not Currently    Partners: Male  Other Topics Concern  . Not on file  Social History Narrative   Married for 37 years.    She has one child and one stepston.      Social Determinants of Health   Financial Resource Strain: Low Risk   . Difficulty of Paying Living Expenses: Not hard at all  Food Insecurity: No Food Insecurity  . Worried About Charity fundraiser in the Last Year: Never true  . Ran Out of Food in  the Last Year: Never true  Transportation Needs: No Transportation Needs  . Lack of Transportation (Medical): No  . Lack of Transportation (Non-Medical): No  Physical Activity: Sufficiently Active  . Days of Exercise per Week: 3 days  . Minutes of Exercise per Session: 60 min  Stress: No Stress Concern Present  . Feeling of Stress : Not at all  Social Connections:   . Frequency of Communication with Friends and Family: Not on file  . Frequency of Social Gatherings with Friends and Family: Not on file  . Attends Religious Services: Not on file  . Active Member of Clubs or Organizations: Not on file  . Attends Archivist Meetings: Not on file  . Marital Status: Not on file     Family History: The patient's family history includes Arthritis in her mother; Breast cancer (age of onset: 54) in her sister; Coronary artery disease in her brother; Diabetes in her father; Heart attack in her brother and father; Heart disease in her father; Hypertension in her brother and mother; Leukemia in her  brother.  ROS:   Please see the history of present illness.     All other systems reviewed and are negative.  EKGs/Labs/Other Studies Reviewed:    The following studies were reviewed today:   EKG:  EKG not ordered today.   Recent Labs: 01/27/2020: Hemoglobin 11.9; Platelet Count 130 05/02/2020: ALT 22; BUN 22; Creatinine, Ser 0.98; Potassium 4.2; Sodium 138  Recent Lipid Panel    Component Value Date/Time   CHOL 169 06/23/2020 0813   TRIG 283 (H) 06/23/2020 0813   HDL 37 (L) 06/23/2020 0813   CHOLHDL 4.6 (H) 06/23/2020 0813   CHOLHDL 4 05/02/2020 0736   VLDL 73.8 (H) 05/02/2020 0736   LDLCALC 85 06/23/2020 0813   LDLDIRECT 57.0 05/02/2020 0736    Physical Exam:    VS:  BP (!) 146/62 (BP Location: Left Arm, Patient Position: Sitting, Cuff Size: Normal)   Pulse 71   Ht 5\' 2"  (1.575 m)   Wt 163 lb 8 oz (74.2 kg)   LMP 08/26/1992 (Approximate)   SpO2 97%   BMI 29.90 kg/m     Wt Readings from Last 3 Encounters:  07/17/20 163 lb 8 oz (74.2 kg)  06/29/20 164 lb (74.4 kg)  06/28/20 163 lb 9.6 oz (74.2 kg)     GEN:  Well nourished, well developed in no acute distress HEENT: Normal NECK: No JVD; No carotid bruits LYMPHATICS: No lymphadenopathy CARDIAC: RRR, no murmurs, rubs, gallops RESPIRATORY:  Clear to auscultation without rales, wheezing or rhonchi  ABDOMEN: Soft, non-tender, non-distended MUSCULOSKELETAL:  No edema; No deformity  SKIN: Warm and dry NEUROLOGIC:  Alert and oriented x 3 PSYCHIATRIC:  Normal affect   ASSESSMENT:    1. Coronary artery disease involving native heart, unspecified vessel or lesion type, unspecified whether angina present   2. Essential hypertension   3. Pure hypercholesterolemia    PLAN:    In order of problems listed above:  1. Chest pain, history of CAD, calcified plaque noted in the LM, LAD, RCA on  CT scan.  Lexiscan Myoview with no evidence for ischemia.  Patient currently asymptomatic.  Continue aspirin 81 mg, increase Lipitor  to 20 mg daily.   2. Hypertension, losartan, HCTZ. 3. History of hyperlipidemia, Lipitor, fenofibrate.  Repeat lipid panel in 3 months.  Follow-up after lipid panel, 3 months  This note was generated in part or whole with voice recognition software. Voice recognition is usually  quite accurate but there are transcription errors that can and very often do occur. I apologize for any typographical errors that were not detected and corrected.  Medication Adjustments/Labs and Tests Ordered: Current medicines are reviewed at length with the patient today.  Concerns regarding medicines are outlined above.  Orders Placed This Encounter  Procedures  . Lipid Profile   No orders of the defined types were placed in this encounter.   Patient Instructions  Medication Instructions:   Your physician recommends that you continue on your current medications as directed. Please refer to the Current Medication list given to you today.  *If you need a refill on your cardiac medications before your next appointment, please call your pharmacy*   Lab Work:  Your physician recommends that you return for a FASTING lipid profile: the week prior to your 3 month follow up.  - You will need to be fasting. Please do not have anything to eat or drink after midnight the morning you have the lab work. You may only have water or black coffee with no cream or sugar.  - Please go to the [facility]. You will check in at the front desk to the right as you walk into the atrium. Valet Parking is offered if needed. - No appointment needed. You may go any day between 7 am and 6 pm.   Testing/Procedures: None Ordered   Follow-Up: At [facility], you and your health needs are our priority.  As part of our continuing mission to provide you with exceptional heart care, we have created designated Provider Care Teams.  These Care Teams include your primary Cardiologist (physician) and Advanced Practice Providers  (APPs -  Physician Assistants and Nurse Practitioners) who all work together to provide you with the care you need, when you need it.  We recommend signing up for the patient portal called "MyChart".  Sign up information is provided on this After Visit Summary.  MyChart is used to connect with patients for Virtual Visits (Telemedicine).  Patients are able to view lab/test results, encounter notes, upcoming appointments, etc.  Non-urgent messages can be sent to your provider as well.   To learn more about what you can do with MyChart, go to [email or web address].    Your next appointment:   3 month(s)  The format for your next appointment:   In Person  Provider:   Kate Sable, MD   Other Instructions      Signed, Kate Sable, MD  07/17/2020 1:00 PM    [place]

## 2020-07-30 NOTE — Progress Notes (Signed)
Patient Care Team: [name], MD as PCP - General (Family Medicine) Kate Sable, MD as PCP - Cardiology (Cardiology) Birder [name], MD as Referring Physician (Ophthalmology) [name], MD as Consulting Physician (Dermatology) [name], MD as Consulting Physician (General Surgery) [name], MD as Consulting Physician (Dentistry) [name], MD (Inactive) as Consulting Physician (General Surgery) [name], MD as Consulting Physician (Hematology and Oncology) [name], MD as Attending Physician (Radiation Oncology) Rolm Bookbinder, MD as Consulting Physician (General Surgery)  DIAGNOSIS:    ICD-10-CM   1. Malignant neoplasm of upper-outer quadrant of left breast in female, estrogen receptor positive ([place])  C50.412    Z17.0     SUMMARY OF ONCOLOGIC HISTORY: Oncology History  Malignant neoplasm of upper-outer quadrant of left breast in female, estrogen receptor positive ([place])  09/14/2018 Initial Diagnosis   With prior history of breast implants that were ruptured/replaced, patient had cellulitis LIQ left breast for 1 week, and UOQ asymmetry 1.5 cm, MRI right breast biopsy benign, left breast numerous masses 7 cm skin thickening: 2 biopsies from left breast: Grade 2-3 IDC ER PR positive, HER-2 positive, Ki-67 50%   09/16/2018 Cancer Staging   Staging form: Breast, AJCC 8th Edition - Clinical stage from 09/16/2018: Stage IIIB (cT4, cN0, cM0, G3, ER+, PR+, HER2+)    09/23/2018 Genetic Testing   Genetic testing performed through [facility]'s Common Hereditary Cancers Panel reported out on 09/23/2018 showed no pathogenic mutations.    The Common Hereditary Cancers Panel offered by [facility] includes sequencing and/or deletion duplication testing of the following 47 genes: APC, ATM, AXIN2, BARD1, BMPR1A, BRCA1, BRCA2, BRIP1, CDH1, CDKN2A (p14ARF), CDKN2A (p16INK4a), CKD4, CHEK2, CTNNA1, DICER1, EPCAM (Deletion/duplication testing only), GREM1  (promoter region deletion/duplication testing only), KIT, MEN1, MLH1, MSH2, MSH3, MSH6, MUTYH, NBN, NF1, NHTL1, PALB2, PDGFRA, PMS2, POLD1, POLE, PTEN, RAD50, RAD51C, RAD51D, SDHB, SDHC, SDHD, SMAD4, SMARCA4. STK11, TP53, TSC1, TSC2, and VHL.  The following genes were evaluated for sequence changes only: SDHA and HOXB13 c.251G>A variant only.   09/30/2018 - 07/06/2020 Chemotherapy   palonosetron (ALOXI) injection 0.25 mg, 0.25 mg, Intravenous,  Once, 6 of 6 cycles. Administration: 0.25 mg (09/30/2018), 0.25 mg (10/21/2018), 0.25 mg (12/23/2018), 0.25 mg (01/13/2019), 0.25 mg (11/11/2018), 0.25 mg (12/02/2018)  pegfilgrastim-cbqv (UDENYCA) injection 6 mg, 6 mg, Subcutaneous, Once, 5 of 5 cycles. Administration: 6 mg (10/02/2018), 6 mg (10/23/2018), 6 mg (11/13/2018), 6 mg (12/04/2018), 6 mg (12/25/2018)  trastuzumab (HERCEPTIN) 600 mg in sodium chloride 0.9 % 250 mL chemo infusion, 609 mg, Intravenous,  Once, 6 of 6 cycles. Administration: 600 mg (09/30/2018), 450 mg (10/21/2018), 450 mg (12/23/2018), 450 mg (01/13/2019), 450 mg (11/11/2018), 450 mg (12/02/2018)  CARBOplatin (PARAPLATIN) 510 mg in sodium chloride 0.9 % 250 mL chemo infusion, 510 mg (103.4 % of original dose 493.2 mg), Intravenous,  Once, 6 of 6 cycles. Dose modification:   (original dose 493.2 mg, Cycle 1). Administration: 510 mg (09/30/2018), 430 mg (10/21/2018), 420 mg (12/23/2018), 420 mg (01/13/2019), 430 mg (11/11/2018), 430 mg (12/02/2018)  DOCEtaxel (TAXOTERE) 140 mg in sodium chloride 0.9 % 250 mL chemo infusion, 75 mg/m2 = 140 mg, Intravenous,  Once, 5 of 5 cycles. Dose modification: 60 mg/m2 (original dose 75 mg/m2, Cycle 2, Reason: Dose not tolerated). Administration: 140 mg (09/30/2018), 110 mg (10/21/2018), 110 mg (12/23/2018), 110 mg (11/11/2018), 110 mg (12/02/2018)  pertuzumab (PERJETA) 420 mg in sodium chloride 0.9 % 250 mL chemo infusion, 420 mg (100 % of original dose 420 mg), Intravenous, Once, 1 of  1 cycle. Dose modification: 420 mg (original dose 420 mg,  Cycle 1, Reason: Provider Judgment). Administration: 420 mg (09/30/2018)  fosaprepitant (EMEND) 150 mg  dexamethasone (DECADRON) 12 mg in sodium chloride 0.9 % 145 mL IVPB, , Intravenous,  Once, 6 of 6 cycles. Administration:  (09/30/2018),  (10/21/2018),  (12/23/2018),  (01/13/2019),  (11/11/2018),  (12/02/2018)  ado-trastuzumab emtansine (KADCYLA) 260 mg in sodium chloride 0.9 % 250 mL chemo infusion, 3.6 mg/kg = 260 mg, Intravenous, Once, 6 of 10 cycles. Dose modification: 3 mg/kg (original dose 3.6 mg/kg, Cycle 5, Reason: Dose not tolerated), 2.4 mg/kg (original dose 3.6 mg/kg, Cycle 6, Reason: Provider Judgment). Administration: 260 mg (03/24/2019), 260 mg (04/14/2019), 200 mg (06/16/2019), 260 mg (05/05/2019), 200 mg (05/26/2019), 160 mg (07/07/2019)   01/19/2019 Breast MRI   Significant interval improvement, the multiple left breast masses and areas of non mass enhancement in all 4 quadrants have decreased in size and extent. The mass previously measuring 1.4 cm with diffuse enhancement now measures 9 mm with heterogeneous areas of enhancement. Current greatest transverse dimension is 5.4 x 3.9 cm, previously 7 cm x 5cm. 2. 4 mm masslike focus of enhancement at the base of the right nipple is unchanged.   03/08/2019 Surgery   Bilateral mastectomies [name] [phone]): right breast: no malignancy; left breast: IDC, grade 2, scattered over 7.5cm, clear margins. No lymph nodes were examined.   03/08/2019 Cancer Staging   Staging form: Breast, AJCC 8th Edition - Pathologic stage from 03/08/2019: No Stage Recommended (ypT3, pNX, cM0, G2, ER+, PR+, HER2-)   05/04/2019 - 06/14/2019 Radiation Therapy   The patient initially received a dose of 50 Gy in 25 fractions to the breast using whole-breast tangent fields. This was delivered using a 3-D conformal technique. The patient also received 50 Gy in 25 fractions to the left supraclavicular region. The pt received a boost delivering an additional 10 Gy in 5  fractions using a electron boost with 76mV electrons. The total dose was 110 Gy.   05/2019 - 05/2024 Anti-estrogen oral therapy   Anastrozole     CHIEF COMPLIANT: Follow-up of left breast cancer on anastrozole   INTERVAL HISTORY: BAMONI SCALLANis a 74y.o. with above-mentioned history of HER-2 positiveleft breast cancertreated withneoadjuvant chemotherapy,bilateral mastectomies, radiation, Herceptinmaintenance, and is currently on antiestrogen therapy with anastrozole.She presents to the clinic todayfor follow-up.  persistent neuropathy in feet. Tolerating anastrozole very well.  ALLERGIES:  is allergic to erythromycin, ciprofloxacin, daypro [oxaprozin], enalapril maleate, nsaids, and vioxx [rofecoxib].  MEDICATIONS:  Current Outpatient Medications  Medication Sig Dispense Refill  . anastrozole (ARIMIDEX) 1 MG tablet TAKE 1 TABLET(1 MG) BY MOUTH DAILY 90 tablet 3  . aspirin 81 MG EC tablet Take 81 mg by mouth daily. Swallow whole.    .Marland Kitchenatorvastatin (LIPITOR) 20 MG tablet Take 1 tablet (20 mg total) by mouth daily. 90 tablet 3  . Calcium Carbonate-Vit D-Min (CALTRATE 600+D PLUS MINERALS) 600-800 MG-UNIT TABS Take 1 tablet by mouth in the morning and at bedtime.    . clobetasol (TEMOVATE) 0.05 % external solution Apply 1 application topically 2 (two) times daily as needed (scalp). Reported on 08/16/2015    . fenofibrate (TRICOR) 145 MG tablet Take 1 tablet (145 mg total) by mouth daily. 30 tablet 5  . fluticasone (FLONASE) 50 MCG/ACT nasal spray SHAKE LIQUID AND USE 2 SPRAYS IN EACH NOSTRIL EVERY DAY 16 g 5  . Fluticasone Furoate (ARNUITY ELLIPTA) 100 MCG/ACT AEPB Inhale 1 puff into the lungs daily. 3[place]  each 6  . gabapentin (NEURONTIN) 300 MG capsule Take 1 capsule (300 mg total) by mouth at bedtime. 90 capsule 3  . glucose blood (FREESTYLE LITE) test strip USE TO CHECK BLOOD SUGAR DAILY 50 strip 11  . ketoconazole (NIZORAL) 2 % cream APPLY TO THE AFFECTED AREA ON FACE TWICE DAILY  UNTIL CLEAR (Patient not taking: Reported on 07/17/2020)    . loratadine (CLARITIN) 10 MG tablet Take 10 mg by mouth daily.    [name] losartan-hydrochlorothiazide (HYZAAR) 100-25 MG tablet Take 1 tablet by mouth daily. 90 tablet 3  . metFORMIN (GLUCOPHAGE) 500 MG tablet TAKE 1 TABLET(500 MG) BY MOUTH TWICE DAILY WITH A MEAL 60 tablet 3  . Multiple Vitamins-Minerals (CENTRUM SILVER 50+WOMEN) TABS Take 1 tablet by mouth daily.    Vladimir Faster Glycol-Propyl Glycol (SYSTANE OP) Apply to eye.     No current facility-administered medications for this visit.    PHYSICAL EXAMINATION: ECOG PERFORMANCE STATUS: 1 - Symptomatic but completely ambulatory  There were no vitals filed for this visit. There were no vitals filed for this visit.  LABORATORY DATA:  I have reviewed the data as listed CMP Latest Ref Rng & Units 05/02/2020 01/27/2020 12/21/2019  Glucose 70 - 99 mg/dL 145(H) 150(H) 142(H)  BUN 6 - 23 mg/dL 22 19 22   Creatinine 0.40 - 1.20 mg/dL 0.98 0.99 0.88  Sodium 135 - 145 mEq/L 138 135 137  Potassium 3.5 - 5.1 mEq/L 4.2 4.1 3.9  Chloride 96 - 112 mEq/L 100 97(L) 100  CO2 19 - 32 mEq/L 27 27 31   Calcium 8.4 - 10.5 mg/dL 9.6 9.6 9.9  Total Protein 6.0 - 8.3 g/dL 6.9 7.2 7.1  Total Bilirubin 0.2 - 1.2 mg/dL 0.5 0.7 0.5  Alkaline Phos 39 - 117 U/L 66 95 106  AST 0 - 37 U/L 21 25 29   ALT 0 - 35 U/L 22 26 32    Lab Results  Component Value Date   WBC 5.1 01/27/2020   HGB 11.9 (L) 01/27/2020   HCT 34.3 (L) 01/27/2020   MCV 103.6 (H) 01/27/2020   PLT 130 (L) 01/27/2020   NEUTROABS 3.6 01/27/2020    ASSESSMENT & PLAN:  Malignant neoplasm of upper-outer quadrant of left breast in female, estrogen receptor positive ([place]) With prior history of breast implants that were ruptured/replaced, patient had cellulitis LIQ left breast for 1 week, and UOQ asymmetry 1.5 cm, MRI right breast biopsy benign, left breast numerous masses 7 cm skin thickening: 2 biopsies from left breast: Grade 2-3 IDC ER PR  positive, HER-2 positive, Ki-67 50% Clinical stage: T4 N0 M0 stage IIIb  Treatment summary: 1. Neoadjuvant chemotherapy with [place] Perjeta 6 cyclescompleted 5/6/2020followed by Herceptin maintenance for 1 year completed 12/01/2019 2. Followed byBilmastectomieswith sentinel lymph node study 03/08/2019: Left mastectomy: Grade 2 IDC spanning 7.5 cm, margins negative, right mastectomy: Benign, ER 95%, PR 5%, HER-2 1+ negative pathology lost the lymph node excision 3. 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  Scattered areas of bronchiectasis in the lungs.  Atherosclerosis and diverticulosis.  No evidence of metastatic disease.  Breast cancer surveillance: 1.  Breast exam 07/31/2020: Benign 2. mammogram: No role of imaging studies since she had bilateral mastectomies.  Brain aneurysm: Following at [facility]. Return to clinic in 1 year for follow-up    No orders of  the defined types were placed in this encounter.  The patient has a good understanding of the overall plan. she agrees with it. she will call with any problems that may develop before the next visit here.  Total time spent: 20 mins including face to face time and time spent for planning, charting and coordination of care  [name], MD 07/31/2020  I, Cloyde Reams Dorshimer, am acting as scribe for Dr. [name].  I have reviewed the above documentation for accuracy and completeness, and I agree with the above.

## 2020-07-31 ENCOUNTER — Inpatient Hospital Stay: Payer: Medicare Other | Attending: Hematology and Oncology | Admitting: Hematology and Oncology

## 2020-07-31 ENCOUNTER — Other Ambulatory Visit: Payer: Self-pay

## 2020-07-31 ENCOUNTER — Telehealth: Payer: Self-pay | Admitting: Hematology and Oncology

## 2020-07-31 DIAGNOSIS — Z7984 Long term (current) use of oral hypoglycemic drugs: Secondary | ICD-10-CM | POA: Diagnosis not present

## 2020-07-31 DIAGNOSIS — C50412 Malignant neoplasm of upper-outer quadrant of left female breast: Secondary | ICD-10-CM | POA: Diagnosis not present

## 2020-07-31 DIAGNOSIS — G620 Drug-induced polyneuropathy: Secondary | ICD-10-CM | POA: Diagnosis not present

## 2020-07-31 DIAGNOSIS — Z170 Estrogen receptor positive status [ER+]: Secondary | ICD-10-CM | POA: Diagnosis not present

## 2020-07-31 DIAGNOSIS — Z79899 Other long term (current) drug therapy: Secondary | ICD-10-CM | POA: Diagnosis not present

## 2020-07-31 DIAGNOSIS — Z79811 Long term (current) use of aromatase inhibitors: Secondary | ICD-10-CM | POA: Insufficient documentation

## 2020-07-31 DIAGNOSIS — I2510 Atherosclerotic heart disease of native coronary artery without angina pectoris: Secondary | ICD-10-CM | POA: Diagnosis not present

## 2020-07-31 DIAGNOSIS — Z9013 Acquired absence of bilateral breasts and nipples: Secondary | ICD-10-CM | POA: Insufficient documentation

## 2020-07-31 DIAGNOSIS — Z923 Personal history of irradiation: Secondary | ICD-10-CM | POA: Insufficient documentation

## 2020-07-31 NOTE — Assessment & Plan Note (Signed)
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 Neoadjuvant chemotherapy with [place] Perjeta 6 cyclescompleted 5/6/2020followed by Herceptin maintenance for 1 year completed 12/01/2019 2. Followed byBilmastectomieswith sentinel lymph node study 03/08/2019: Left mastectomy: Grade 2 IDC spanning 7.5 cm, margins negative, right mastectomy: Benign, ER 95%, PR 5%, HER-2 1+ negative pathology lost the lymph node excision 3. 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  Scattered areas of bronchiectasis in the lungs.  Atherosclerosis and diverticulosis.  No evidence of metastatic disease.  Breast cancer surveillance: 1.  Breast exam 07/31/2020: Benign 2. mammogram: No role of imaging studies since she had bilateral mastectomies.  Return to clinic in 1 year for follow-up

## 2020-07-31 NOTE — Telephone Encounter (Signed)
Scheduled appts per 12/6 los. Gave pt a print out AVS.

## 2020-08-01 DIAGNOSIS — Z9889 Other specified postprocedural states: Secondary | ICD-10-CM | POA: Diagnosis not present

## 2020-08-01 DIAGNOSIS — I728 Aneurysm of other specified arteries: Secondary | ICD-10-CM | POA: Diagnosis not present

## 2020-08-01 DIAGNOSIS — Z66 Do not resuscitate: Secondary | ICD-10-CM | POA: Diagnosis not present

## 2020-08-01 DIAGNOSIS — I671 Cerebral aneurysm, nonruptured: Secondary | ICD-10-CM | POA: Diagnosis not present

## 2020-08-07 DIAGNOSIS — M65321 Trigger finger, right index finger: Secondary | ICD-10-CM | POA: Diagnosis not present

## 2020-08-11 ENCOUNTER — Other Ambulatory Visit: Payer: Self-pay

## 2020-08-11 ENCOUNTER — Ambulatory Visit (INDEPENDENT_AMBULATORY_CARE_PROVIDER_SITE_OTHER): Payer: Medicare Other | Admitting: Family Medicine

## 2020-08-11 VITALS — BP 128/62 | HR 78 | Temp 97.9°F | Ht 62.0 in | Wt 164.0 lb

## 2020-08-11 DIAGNOSIS — I2510 Atherosclerotic heart disease of native coronary artery without angina pectoris: Secondary | ICD-10-CM | POA: Diagnosis not present

## 2020-08-11 DIAGNOSIS — E113293 Type 2 diabetes mellitus with mild nonproliferative diabetic retinopathy without macular edema, bilateral: Secondary | ICD-10-CM

## 2020-08-11 DIAGNOSIS — I152 Hypertension secondary to endocrine disorders: Secondary | ICD-10-CM | POA: Diagnosis not present

## 2020-08-11 DIAGNOSIS — E1159 Type 2 diabetes mellitus with other circulatory complications: Secondary | ICD-10-CM

## 2020-08-11 LAB — POCT GLYCOSYLATED HEMOGLOBIN (HGB A1C): Hemoglobin A1C: 6.3 % — AB (ref 4.0–5.6)

## 2020-08-11 NOTE — Assessment & Plan Note (Signed)
Stable, chronic.  Continue current medication.   Losartan HCTZ 100/25 mg daily

## 2020-08-11 NOTE — Progress Notes (Signed)
Patient ID: Katie [name], female    DOB: Jul 02, 1946, 74 y.o.   MRN: [id number]  This visit was conducted in person.  BP 128/62 (BP Location: Left Arm, Patient Position: Sitting, Cuff Size: Normal)   Pulse 78   Temp 97.9 F (36.6 C) (Oral)   Ht 5\' 2"  (1.575 m)   Wt 164 lb (74.4 kg)   LMP 08/26/1992 (Approximate)   SpO2 96%   BMI 30.00 kg/m    CC: 3 month DM follow up Subjective:   HPI: Katie [name] is a 74 y.o. female presenting on 08/11/2020 for Diabetes    Diabetes:  At last OV 3 months ago...stopped metformin due to bloating. She  Has had some  improvement in blating Using medications without difficulties:none Hypoglycemic episodes: Hyperglycemic episodes: occ after eating ... had cortisone injection 1 week ago. Feet problems: Blood Sugars averaging: 121-140 eye exam within last year: followed by eye MD... retinopathy due from DM  She has been active.. but no formal exercise. Moderate eating habits.  Hypertension:   At goal on losartan and HCTZ Using medication without problems or lightheadedness:  Chest pain with exertion: Edema: Short of breath: Average home BPs: Other issues:   She is now on tricor for high triglyceridemia. On for 1 month.       Relevant past medical, surgical, family and social history reviewed and updated as indicated. Interim medical history since our last visit reviewed. Allergies and medications reviewed and updated. Outpatient Medications Prior to Visit  Medication Sig Dispense Refill  . anastrozole (ARIMIDEX) 1 MG tablet TAKE 1 TABLET(1 MG) BY MOUTH DAILY 90 tablet 3  . aspirin 81 MG EC tablet Take 81 mg by mouth daily. Swallow whole.    [name] atorvastatin (LIPITOR) 20 MG tablet Take 1 tablet (20 mg total) by mouth daily. 90 tablet 3  . Calcium Carbonate-Vit D-Min (CALTRATE 600+D PLUS MINERALS) 600-800 MG-UNIT TABS Take 1 tablet by mouth in the morning and at bedtime.    . clobetasol (TEMOVATE) 0.05 % external solution Apply 1  application topically 2 (two) times daily as needed (scalp). Reported on 08/16/2015    . fenofibrate (TRICOR) 145 MG tablet Take 1 tablet (145 mg total) by mouth daily. 30 tablet 5  . fluticasone (FLONASE) 50 MCG/ACT nasal spray SHAKE LIQUID AND USE 2 SPRAYS IN EACH NOSTRIL EVERY DAY 16 g 5  . Fluticasone Furoate (ARNUITY ELLIPTA) 100 MCG/ACT AEPB Inhale 1 puff into the lungs daily. 30 each 6  . gabapentin (NEURONTIN) 300 MG capsule Take 1 capsule (300 mg total) by mouth at bedtime. 90 capsule 3  . glucose blood (FREESTYLE LITE) test strip USE TO CHECK BLOOD SUGAR DAILY 50 strip 11  . ketoconazole (NIZORAL) 2 % cream APPLY TO THE AFFECTED AREA ON FACE TWICE DAILY UNTIL CLEAR    . loratadine (CLARITIN) 10 MG tablet Take 10 mg by mouth daily.    [name] losartan-hydrochlorothiazide (HYZAAR) 100-25 MG tablet Take 1 tablet by mouth daily. 90 tablet 3  . metFORMIN (GLUCOPHAGE) 500 MG tablet TAKE 1 TABLET(500 MG) BY MOUTH TWICE DAILY WITH A MEAL 60 tablet 3  . Multiple Vitamins-Minerals (CENTRUM SILVER 50+WOMEN) TABS Take 1 tablet by mouth daily.    Vladimir Faster Glycol-Propyl Glycol (SYSTANE OP) Apply to eye.     No facility-administered medications prior to visit.     Per HPI unless specifically indicated in ROS section below Review of Systems  Constitutional: Negative for fatigue and fever.  HENT: Negative for  ear pain.   Eyes: Negative for pain.  Respiratory: Negative for chest tightness and shortness of breath.   Cardiovascular: Negative for chest pain, palpitations and leg swelling.  Gastrointestinal: Negative for abdominal pain.  Genitourinary: Negative for dysuria.   Objective:  BP 128/62 (BP Location: Left Arm, Patient Position: Sitting, Cuff Size: Normal)   Pulse 78   Temp 97.9 F (36.6 C) (Oral)   Ht 5\' 2"  (1.575 m)   Wt 164 lb (74.4 kg)   LMP 08/26/1992 (Approximate)   SpO2 96%   BMI 30.00 kg/m   Wt Readings from Last 3 Encounters:  08/11/20 164 lb (74.4 kg)  07/31/20 162 lb 3.2  oz (73.6 kg)  07/17/20 163 lb 8 oz (74.2 kg)      Physical Exam Constitutional:      General: She is not in acute distress.Vital signs are normal.     Appearance: Normal appearance. She is well-developed and well-nourished. She is not ill-appearing or toxic-appearing.  HENT:     Head: Normocephalic.     Right Ear: Hearing, tympanic membrane, ear canal and external ear normal. Tympanic membrane is not erythematous, retracted or bulging.     Left Ear: Hearing, tympanic membrane, ear canal and external ear normal. Tympanic membrane is not erythematous, retracted or bulging.     Nose: No mucosal edema or rhinorrhea.     Right Sinus: No maxillary sinus tenderness or frontal sinus tenderness.     Left Sinus: No maxillary sinus tenderness or frontal sinus tenderness.     Mouth/Throat:     Mouth: Oropharynx is clear and moist and mucous membranes are normal.     Pharynx: Uvula midline.  Eyes:     General: Lids are normal. Lids are everted, no foreign bodies appreciated.     Extraocular Movements: EOM normal.     Conjunctiva/sclera: Conjunctivae normal.     Pupils: Pupils are equal, round, and reactive to light.  Neck:     Thyroid: No thyroid mass or thyromegaly.     Vascular: No carotid bruit.     Trachea: Trachea normal.  Cardiovascular:     Rate and Rhythm: Normal rate and regular rhythm.     Pulses: Normal pulses and intact distal pulses.     Heart sounds: Normal heart sounds, S1 normal and S2 normal. No murmur heard. No friction rub. No gallop.   Pulmonary:     Effort: Pulmonary effort is normal. No tachypnea or respiratory distress.     Breath sounds: Normal breath sounds. No decreased breath sounds, wheezing, rhonchi or rales.  Abdominal:     General: Bowel sounds are normal.     Palpations: Abdomen is soft.     Tenderness: There is no abdominal tenderness.  Musculoskeletal:     Cervical back: Normal range of motion and neck supple.  Skin:    General: Skin is warm, dry and  intact.     Findings: No rash.  Neurological:     Mental Status: She is alert.  Psychiatric:        Mood and Affect: Mood is not anxious or depressed.        Speech: Speech normal.        Behavior: Behavior normal. Behavior is cooperative.        Thought Content: Thought content normal.        Cognition and Memory: Cognition and memory normal.        Judgment: Judgment normal.       Results for  orders placed or performed during the hospital encounter of 07/14/20  NM Myocar Multi W/Spect W/Wall Motion / EF  Result Value Ref Range   Rest HR 99 bpm   Rest BP 137/54 mmHg   Exercise duration (sec) 0 sec   Percent HR 66 %   Exercise duration (min) 0 min   Estimated workload 1.0 METS   Peak HR 99 bpm   Peak BP 137/54 mmHg   MPHR 145 bpm   SSS 0    SRS 0    SDS 1    TID 0.90    LV sys vol 9 mL   LV dias vol 31 46 - 106 mL    This visit occurred during the SARS-CoV-2 public health emergency.  Safety protocols were in place, including screening questions prior to the visit, additional usage of staff PPE, and extensive cleaning of exam room while observing appropriate contact time as indicated for disinfecting solutions.   COVID 19 screen:  No recent travel or known exposure to COVID19 The patient denies respiratory symptoms of COVID 19 at this time. The importance of social distancing was discussed today.   Assessment and Plan   Controlled type 2 diabetes with retinopathy ([place]) Chronic, stable control off metformin per A1C but some measurements above gaol.  Encouraged exercise, weight loss, healthy eating habits.  If CBGs not 80-120 in next few months consider adding glucotrol Xl low dose.   Hypertension associated with diabetes (HCC) Stable, chronic.  Continue current medication.   Losartan HCTZ 100/25 mg daily    Eliezer Lofts, MD

## 2020-08-11 NOTE — Assessment & Plan Note (Addendum)
Chronic, stable control off metformin per A1C but some measurements above gaol.  Encouraged exercise, weight loss, healthy eating habits.  If CBGs not 80-120 in next few months consider adding glucotrol Xl low dose.

## 2020-08-11 NOTE — Patient Instructions (Signed)
Work on  [employer], increase exercise, weight loss.  Goal fasting blood sugar in AM 80-120, 2  Hour after meals < 180.  Call for possible start of medication if blood sugars are remaiing elevated after make more dietary changes.

## 2020-08-18 ENCOUNTER — Other Ambulatory Visit: Payer: Self-pay | Admitting: Hematology and Oncology

## 2020-08-31 ENCOUNTER — Other Ambulatory Visit: Payer: Self-pay

## 2020-08-31 ENCOUNTER — Ambulatory Visit (INDEPENDENT_AMBULATORY_CARE_PROVIDER_SITE_OTHER): Payer: Medicare Other | Admitting: Pulmonary Disease

## 2020-08-31 ENCOUNTER — Encounter: Payer: Self-pay | Admitting: Pulmonary Disease

## 2020-08-31 VITALS — BP 126/82 | HR 75 | Temp 97.1°F | Ht 62.0 in | Wt 166.4 lb

## 2020-08-31 DIAGNOSIS — J479 Bronchiectasis, uncomplicated: Secondary | ICD-10-CM | POA: Diagnosis not present

## 2020-08-31 DIAGNOSIS — R059 Cough, unspecified: Secondary | ICD-10-CM | POA: Diagnosis not present

## 2020-08-31 DIAGNOSIS — I272 Pulmonary hypertension, unspecified: Secondary | ICD-10-CM | POA: Diagnosis not present

## 2020-08-31 DIAGNOSIS — G4733 Obstructive sleep apnea (adult) (pediatric): Secondary | ICD-10-CM | POA: Diagnosis not present

## 2020-08-31 NOTE — Progress Notes (Signed)
Subjective:    Patient ID: Katie [name], female    DOB: [birth year], 75 y.o.   MRN: [id number]  HPI  Katie [name] is a 75 year old lifelong never smoker, with a history of HER-2 positive breast cancer status post bilateral mastectomies, radiation and neoadjuvant chemotherapy.  Patient presented initially for issues with cough that started after initiation of anastrozole.  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  This point inhaled corticosteroid therapy with Arnuity was started.  She has noted improvement of her cough with this.  Subsequently she has also been noted to have mild pulmonary hypertension and duration of this issue led to home sleep study which showed moderate sleep apnea.  She is currently using CPAP of auto CPAP at 5 to 20 cm H2O of pressure.  She notes that she has adapted well to this.  She feels more refreshed in the mornings.  Also feels less short of breath.  We reviewed her compliance report and she has been compliant.  Only one night that she forgot to use the device.  She notes improvement in symptoms with this.  Voices no active complaint today.  Cough has been well controlled.   DATA: 09/24/2019 2D echo: LVEF 60 to 65%, normal LV function, indeterminate LV diastolic parameters. Mildly elevated pulmonary artery systolic pressure. 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  Very mild cylindrical bronchiectasis in the lungs bilaterally with some mucus impaction right lower lobe.  Mediastinal adenopathy. 02/17/2020 PFT: Normal spirometry and lung volumes.  Review of Systems A 10 point review of systems was performed and it is as noted above otherwise negative.  Patient Active Problem List   Diagnosis Date Noted  . Bronchiectasis without complication ([place]) 57/08/7791  . Aortic atherosclerosis ([place]) 12/23/2019  . Atherosclerosis of coronary artery of native  heart without angina pectoris 12/23/2019  . Peripheral edema 12/22/2019  . Chemotherapy-induced peripheral neuropathy ([place]) 10/29/2019  . Breast cancer, left breast ([place]) 03/08/2019  . 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 Urine WBC increased 01/05/2019  . Anemia due to chemotherapy 10/15/2018  . Port-A-Cath in place 09/29/2018  . Diabetic retinopathy ([place]) 09/29/2018  . Genetic testing 09/23/2018  . Family history of leukemia 09/16/2018  . Family history of breast cancer   . Malignant neoplasm of upper-outer quadrant of left breast in female, estrogen receptor positive ([place]) 09/11/2018  . H/O bilateral breast implants 08/28/2018  . Family history of breast cancer in sister 08/28/2018  . Diverticular disease 10/17/2017  . Primary osteoarthritis of right knee 03/07/2017  . 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 Vitamin D deficiency 10/11/2016  . Family history of thyroid disorder 04/04/2015  . 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  . Counseling regarding end of life decision making 10/04/2014  . 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 History of duodenal ulcer 08/02/2014  . Seasonal allergies 08/02/2014  . Hypertension associated with diabetes ([place]) 08/02/2014  . Hyperlipidemia associated with type 2 diabetes mellitus ([place]) 08/02/2014  . 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 History of joint replacement 03/01/2014  . Controlled type 2 diabetes with retinopathy ([place]) 11/07/2009   Allergies  Allergen Reactions  . Erythromycin Anaphylaxis    REACTION: Rash, hives  . Ciprofloxacin Rash  . Daypro [Oxaprozin] Rash  . Enalapril Maleate Nausea Only and Rash  . Nsaids Nausea Only  . Vioxx [Rofecoxib] Nausea Only   Current Meds  Medication Sig  . anastrozole (ARIMIDEX) 1 MG tablet TAKE 1 TABLET(1 MG) BY MOUTH DAILY  . aspirin 81 MG EC tablet Take 81 mg by mouth daily. Swallow whole.  [name] atorvastatin (LIPITOR) 20 MG tablet Take 1 tablet (20 mg total) by  mouth daily.  . Calcium Carbonate-Vit D-Min (CALTRATE 600+D PLUS MINERALS) 600-800 MG-UNIT TABS Take 1 tablet by mouth in the morning and at bedtime.  . clobetasol (TEMOVATE) 0.05 % external solution Apply 1 application topically 2 (two) times daily as needed (scalp). Reported on 08/16/2015  . fenofibrate (TRICOR) 145 MG tablet Take 1 tablet (145 mg total) by mouth daily.  . fluticasone (FLONASE) 50 MCG/ACT nasal spray SHAKE LIQUID AND USE 2 SPRAYS IN EACH NOSTRIL EVERY DAY  . Fluticasone Furoate (ARNUITY ELLIPTA) 100 MCG/ACT AEPB Inhale 1 puff into the lungs daily.  [name] gabapentin (NEURONTIN) 300 MG capsule TAKE 1 CAPSULE(300 MG) BY MOUTH AT BEDTIME  . glucose blood (FREESTYLE LITE) test strip USE TO CHECK BLOOD SUGAR DAILY  . ketoconazole (NIZORAL) 2 % cream APPLY TO THE AFFECTED AREA ON FACE TWICE DAILY UNTIL CLEAR  . loratadine (CLARITIN) 10 MG tablet Take 10 mg by mouth daily.  [name] losartan-hydrochlorothiazide (HYZAAR) 100-25 MG tablet Take 1 tablet by mouth daily.  . metFORMIN (GLUCOPHAGE) 500 MG tablet TAKE 1 TABLET(500 MG) BY MOUTH TWICE DAILY WITH A MEAL  . Multiple Vitamins-Minerals (CENTRUM SILVER 50+WOMEN) TABS Take 1 tablet by mouth daily.  Vladimir Faster Glycol-Propyl Glycol (SYSTANE OP) Apply to eye.   Immunization History  Administered Date(s) Administered  . Fluad Quad(high Dose 65+) 04/30/2019, 05/12/2020  . Hep A / Hep B 05/29/2009, 07/03/2009, 11/24/2009  . Influenza, High Dose Seasonal PF 04/18/2014, 04/13/2015, 04/30/2018  . Influenza, Seasonal, Injecte, Preservative Fre 06/26/2016  . Influenza-Unspecified 05/17/2009, 04/02/2011, 05/20/2012, 06/02/2013, 07/01/2013, 04/18/2014, 04/13/2015, 06/27/2016, 04/11/2017  . PFIZER SARS-COV-2 Vaccination 10/06/2019, 10/27/2019, 04/24/2020  . Pneumococcal Conjugate-13 04/18/2014  . Pneumococcal Polysaccharide-23 10/06/2015  . Td 05/17/2009, 05/12/2020  . Typhoid Inactivated 05/17/2009  . Yellow Fever 05/17/2009  . Zoster 04/02/2011        Objective:   Physical Exam BP 126/82 (BP Location: Left Arm, Cuff Size: Normal)   Pulse 75   Temp (!) 97.1 F (36.2 C) (Temporal)   Ht 5' 2" (1.575 m)   Wt 166 lb 6.4 oz (75.5 kg)   LMP 08/26/1992 (Approximate)   SpO2 96%   BMI 30.43 kg/m  GENERAL: Awake, alert, fully ambulatory, no acute distress. HEAD: Normocephalic, atraumatic.  EYES: Pupils equal, round, reactive to light. No scleral icterus.  MOUTH: Nose/mouth/throat not examined due to masking requirements for COVID 19. NECK: Supple. No thyromegaly. Trachea midline. No JVD. No adenopathy. PULMONARY: Lungs clear to auscultation bilaterally. CARDIOVASCULAR: S1 and S2. Regular rate and rhythm. No rubs murmurs or gallops heard. GASTROINTESTINAL: Benign. MUSCULOSKELETAL: No joint deformity, no clubbing, no edema.  NEUROLOGIC: Awake, alert, no focal deficits. Speech is fluent. No gait disturbance noted, fully ambulatory. SKIN: Intact,warm,dry. PSYCH: Mood and behavior appropriate     Assessment & Plan:     ICD-10-CM   1. Bronchiectasis without complication ([place])  O24.2    Well-controlled Continue Arnuity daily  2. Pulmonary hypertension (HCC) - mild  I27.20    Possibly related to OSA Will need repeat echo in another 6  months  3. Cough  R05.9    Well-controlled with Arnuity Continue same  4. OSA (obstructive sleep apnea)  G47.33    Continue CPAP Patient reports improvement in symptoms Patient compliant  Follow-up will be in 6 months time she is to contact us prior to that time should any new difficulties arise.  Renold Don, MD [facility]   *This note was dictated using voice recognition software/Dragon.  Despite best efforts to proofread, errors can occur which can change the meaning.  Any change was purely unintentional.

## 2020-08-31 NOTE — Patient Instructions (Signed)
Continue Arnuity Ellipta  Continue CPAP  Follow-up in 6 months time call sooner should any new problems arise

## 2020-09-07 DIAGNOSIS — M810 Age-related osteoporosis without current pathological fracture: Secondary | ICD-10-CM

## 2020-09-20 NOTE — Telephone Encounter (Signed)
Anastaystia can you help patient with the correct phone number?

## 2020-09-22 NOTE — Addendum Note (Signed)
Addended by: Kris Mouton on: 09/22/2020 03:46 PM   Modules accepted: Orders

## 2020-09-28 ENCOUNTER — Ambulatory Visit
Admission: RE | Admit: 2020-09-28 | Discharge: 2020-09-28 | Disposition: A | Discharge status: Home / Self Care | Payer: Medicare Other | Source: Ambulatory Visit | Attending: Family Medicine | Admitting: Family Medicine

## 2020-09-28 ENCOUNTER — Other Ambulatory Visit: Payer: Self-pay

## 2020-09-28 ENCOUNTER — Encounter: Payer: Self-pay | Admitting: Hematology and Oncology

## 2020-09-28 DIAGNOSIS — Z780 Asymptomatic menopausal state: Secondary | ICD-10-CM | POA: Diagnosis not present

## 2020-09-28 DIAGNOSIS — Z8739 Personal history of other diseases of the musculoskeletal system and connective tissue: Secondary | ICD-10-CM | POA: Diagnosis not present

## 2020-09-28 DIAGNOSIS — E559 Vitamin D deficiency, unspecified: Secondary | ICD-10-CM | POA: Diagnosis not present

## 2020-09-28 DIAGNOSIS — M810 Age-related osteoporosis without current pathological fracture: Secondary | ICD-10-CM | POA: Insufficient documentation

## 2020-09-29 ENCOUNTER — Telehealth: Payer: Self-pay | Admitting: *Deleted

## 2020-09-29 NOTE — Telephone Encounter (Signed)
Conversation: Bone Density Scan (Newest Message First) September 29, 2020  Me to Indiah, [place]      9:21 AM Thank you- Dr Lindi Adie is out of the office today - we will forward your message for review upon his return on Monday- thank you  Val Rn  This MyChart message has not been read. September 28, 2020  [name] to [name], MD      6:57 PM Dr. Lindi Adie, my appointment with [name] for a bone density scan had to be cancelled because their machine is broken, so I had a scan completed today at [name] in [place]. The results are in the [name] system if you want to review them. Kellie Moor

## 2020-10-02 ENCOUNTER — Telehealth: Payer: Self-pay | Admitting: Hematology and Oncology

## 2020-10-02 NOTE — Telephone Encounter (Signed)
I left a voicemail for the patient that the bone density showed a T score of -2.5 which falls into the category of osteoporosis.  She would benefit from bisphosphonate therapy.  I asked her to call us back to discuss the results and come up with her management plan.

## 2020-10-03 ENCOUNTER — Other Ambulatory Visit: Payer: Self-pay | Admitting: Hematology and Oncology

## 2020-10-03 MED ORDER — ALENDRONATE SODIUM 70 MG PO TABS: 70.0000 mg | ORAL_TABLET | ORAL | 3 refills | Status: DC

## 2020-10-03 NOTE — Progress Notes (Signed)
Sent a prescription for Fosamax for osteoporosis.

## 2020-10-06 DIAGNOSIS — H26493 Other secondary cataract, bilateral: Secondary | ICD-10-CM | POA: Diagnosis not present

## 2020-10-06 LAB — HM DIABETES EYE EXAM

## 2020-10-12 ENCOUNTER — Other Ambulatory Visit
Admission: RE | Admit: 2020-10-12 | Discharge: 2020-10-12 | Disposition: A | Discharge status: Home / Self Care | Payer: Medicare Other | Attending: Cardiology | Admitting: Cardiology

## 2020-10-12 ENCOUNTER — Other Ambulatory Visit: Payer: Self-pay

## 2020-10-12 DIAGNOSIS — E7800 Pure hypercholesterolemia, unspecified: Secondary | ICD-10-CM | POA: Diagnosis not present

## 2020-10-12 LAB — LIPID PANEL
Cholesterol: 161 mg/dL (ref 0–200)
HDL: 48 mg/dL (ref >40)
LDL Cholesterol: 80 mg/dL (ref 0–99)
Total CHOL/HDL Ratio: 3.4 RATIO
Triglycerides: 165 mg/dL — ABNORMAL HIGH (ref <150)
VLDL: 33 mg/dL (ref 0–40)

## 2020-10-16 ENCOUNTER — Encounter: Payer: Self-pay | Admitting: Cardiology

## 2020-10-16 ENCOUNTER — Other Ambulatory Visit: Payer: Self-pay

## 2020-10-16 ENCOUNTER — Ambulatory Visit (INDEPENDENT_AMBULATORY_CARE_PROVIDER_SITE_OTHER): Payer: Medicare Other | Admitting: Cardiology

## 2020-10-16 VITALS — BP 120/64 | HR 86 | Ht 62.0 in | Wt 174.0 lb

## 2020-10-16 DIAGNOSIS — I10 Essential (primary) hypertension: Secondary | ICD-10-CM

## 2020-10-16 DIAGNOSIS — E7800 Pure hypercholesterolemia, unspecified: Secondary | ICD-10-CM

## 2020-10-16 DIAGNOSIS — I2510 Atherosclerotic heart disease of native coronary artery without angina pectoris: Secondary | ICD-10-CM

## 2020-10-16 MED ORDER — FENOFIBRATE 145 MG PO TABS: 145.0000 mg | ORAL_TABLET | Freq: Every day | ORAL | 3 refills | Status: DC

## 2020-10-16 NOTE — Progress Notes (Signed)
Cardiology Office Note:    Date:  10/16/2020   ID:  Katie [name], DOB 1945/09/20, MRN [id number]  PCP:  [name], MD  Cardiologist:  Kate Sable, MD  Electrophysiologist:  None   Referring MD: [name], MD   Chief Complaint  Patient presents with  . Follow-up    3 Months follow up. Medications verbally reviewed with patient.     History of Present Illness:    Katie [name] is a 75 y.o. female with a hx of CAD ( chest CT 11/2019-calcified plaque in [facility]), hypertension, hyperlipidemia, diabetes, breast cancer (s/p bilateral mastectomy, chemo, radiation and immunotherapy) who presents for follow-up.   Patient previously seen for CAD and hyperlipidemia.  Placed on Lipitor, fenofibrate due to elevated triglycerides..  Repeat lipid panel obtained.  She feels well, denies chest pain, shortness of breath.  Tolerating Lipitor and fenofibrate without any adverse effects.  Prior notes patient s/p chemotherapy for breast cancer.  She had a chest CT to evaluate for the presence of any residual disease. Chest CT obtained on 12/08/2019 showed calcified plaque in the left main, LAD and RCA. echocardiogram 08/2019  showed normal systolic function, EF 60 to 65%.  Past Medical History:  Diagnosis Date  . Allergy   . Anemia   . 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 Blood transfusion without reported diagnosis   . Cancer ([place])    breast 08/2018  . Coronary artery disease   . Diverticulitis   . DJD (degenerative joint disease)   . DM type 2 (diabetes mellitus, type 2) ([place])   . Family history of breast cancer   . Fibrocystic breast disease   . GERD (gastroesophageal reflux disease)   . History of chicken pox   . Hypercholesteremia   . Hypertension   . Lichen planopilaris   . Urinary incontinence   . Vertigo 12/2019   1 episode    Past Surgical History:  Procedure Laterality Date  . BREAST IMPLANT REMOVAL Bilateral 03/08/2019    Procedure: Removal of Bilateral Breast Implants;  Surgeon: Rolm Bookbinder, MD;  Location: [place];  Service: General;  Laterality: Bilateral;  . BREAST SURGERY  02/1975   Left Breast Biopsy-Fibrocystic Disease  . BREAST SURGERY  10/1981   Bialteral Capsulectomy Breast Surgery  . BREAST SURGERY  07/1998   Replace Bilateral Silicone Implants  . BREAST SURGERY  01/1976   Bilater breast surgery to remove fibrocystic tissue and replace with silicone implants  . CATARACT EXTRACTION W/PHACO Right 02/08/2020   Procedure: CATARACT EXTRACTION PHACO AND INTRAOCULAR LENS PLACEMENT ([place]) RIGHT DIABETIC;  Surgeon: Birder [name], MD;  Location: [place];  Service: Ophthalmology;  Laterality: Right;  3.55 0:33.7  . CATARACT EXTRACTION W/PHACO Left 02/29/2020   Procedure: CATARACT EXTRACTION PHACO AND INTRAOCULAR LENS PLACEMENT ([facility]) LEFT DIABETIC 2.93  00:27.1;  Surgeon: Birder [name], MD;  Location: [place];  Service: Ophthalmology;  Laterality: Left;  Diabetic - oral meds  . CHOLECYSTECTOMY N/A 07/08/2016   Procedure: LAPAROSCOPIC CHOLECYSTECTOMY;  Surgeon: [name], MD;  Location: [facility];  Service: General;  Laterality: N/A;  . COLONOSCOPY WITH PROPOFOL N/A 11/07/2017   Procedure: COLONOSCOPY WITH PROPOFOL;  Surgeon: Jonathon Bellows, MD;  Location: [facility] ENDOSCOPY;  Service: Gastroenterology;  Laterality: N/A;  . DILATION AND CURETTAGE OF UTERUS  01/1994  . ESOPHAGOGASTRODUODENOSCOPY (EGD) WITH PROPOFOL N/A 12/05/2016   Procedure: ESOPHAGOGASTRODUODENOSCOPY (EGD) WITH PROPOFOL;  Surgeon: Jonathon Bellows, MD;  Location: [facility] ENDOSCOPY;  Service: Endoscopy;  Laterality: N/A;  . FINGER SURGERY  2001 & 2003   Trigger Finger  . FOOT SURGERY  1980   To Relieve Pinched Nerve  . FOOT SURGERY  07/2008   Left Foot-Plantar Fasciitis  . JOINT REPLACEMENT  04/19/2013   Hip Replacement-Right  . MASTECTOMY W/ SENTINEL NODE BIOPSY Bilateral 03/08/2019   Procedure: BILATERAL MASTECTOMIES WITH  LEFT AXILLARY SENTINEL LYMPH NODE BIOPSY AND BLUE DYE INJECTION;  Surgeon: Rolm Bookbinder, MD;  Location: [place];  Service: General;  Laterality: Bilateral;  . PARTIAL HIP ARTHROPLASTY Right   . PLACEMENT OF BREAST IMPLANTS  12/26/2004   Replace Failed Implants  . REPLACEMENT TOTAL KNEE Left    2019-  . RHINOPLASTY  03/1964   To Correct Deviated Septum    Current Medications: Current Meds  Medication Sig  . alendronate (FOSAMAX) 70 MG tablet Take 1 tablet (70 mg total) by mouth once a week. Take with a full glass of water on an empty stomach.  [name] anastrozole (ARIMIDEX) 1 MG tablet TAKE 1 TABLET(1 MG) BY MOUTH DAILY  . aspirin 81 MG EC tablet Take 81 mg by mouth daily. Swallow whole.  [name] atorvastatin (LIPITOR) 20 MG tablet Take 1 tablet (20 mg total) by mouth daily.  . Calcium Carbonate-Vit D-Min (CALTRATE 600+D PLUS MINERALS) 600-800 MG-UNIT TABS Take 1 tablet by mouth in the morning and at bedtime.  . clobetasol (TEMOVATE) 0.05 % external solution Apply 1 application topically 2 (two) times daily as needed (scalp). Reported on 08/16/2015  . fluticasone (FLONASE) 50 MCG/ACT nasal spray SHAKE LIQUID AND USE 2 SPRAYS IN EACH NOSTRIL EVERY DAY  . Fluticasone Furoate (ARNUITY ELLIPTA) 100 MCG/ACT AEPB Inhale 1 puff into the lungs daily.  [name] gabapentin (NEURONTIN) 300 MG capsule TAKE 1 CAPSULE(300 MG) BY MOUTH AT BEDTIME  . glucose blood (FREESTYLE LITE) test strip USE TO CHECK BLOOD SUGAR DAILY  . loratadine (CLARITIN) 10 MG tablet Take 10 mg by mouth daily.  [name] losartan-hydrochlorothiazide (HYZAAR) 100-25 MG tablet Take 1 tablet by mouth daily.  . metFORMIN (GLUCOPHAGE) 500 MG tablet TAKE 1 TABLET(500 MG) BY MOUTH TWICE DAILY WITH A MEAL  . Multiple Vitamins-Minerals (CENTRUM SILVER 50+WOMEN) TABS Take 1 tablet by mouth daily.  Vladimir Faster Glycol-Propyl Glycol (SYSTANE OP) Apply to eye.  . [DISCONTINUED] fenofibrate (TRICOR) 145 MG tablet Take 1 tablet (145 mg total) by mouth daily.      Allergies:   Erythromycin, Ciprofloxacin, Daypro [oxaprozin], Enalapril maleate, Nsaids, and Vioxx [rofecoxib]   Social History   Socioeconomic History  . Marital status: Married    Spouse name: Not on file  . Number of children: 1  . Years of education: Not on file  . Highest education level: Not on file  Occupational History  . Occupation: Retired  Tobacco Use  . Smoking status: Never Smoker  . Smokeless tobacco: Never Used  Vaping Use  . Vaping Use: Never used  Substance and Sexual Activity  . Alcohol use: Not Currently    Alcohol/week: 0.0 standard drinks    Comment: rarely  . Drug use: No  . Sexual activity: Not Currently    Partners: Male  Other Topics Concern  . Not on file  Social History Narrative   Married for 37 years.    She has one child and one stepston.      Social Determinants of Health   Financial Resource Strain: Low Risk   . Difficulty of Paying Living Expenses: Not hard at all  Food  Insecurity: No Food Insecurity  . Worried About Charity fundraiser in the Last Year: Never true  . Ran Out of Food in the Last Year: Never true  Transportation Needs: No Transportation Needs  . Lack of Transportation (Medical): No  . Lack of Transportation (Non-Medical): No  Physical Activity: Sufficiently Active  . Days of Exercise per Week: 3 days  . Minutes of Exercise per Session: 60 min  Stress: No Stress Concern Present  . Feeling of Stress : Not at all  Social Connections: Not on file     Family History: The patient's family history includes Arthritis in her mother; Breast cancer (age of onset: 44) in her sister; Coronary artery disease in her brother; Diabetes in her father; Heart attack in her brother and father; Heart disease in her father; Hypertension in her brother and mother; Leukemia in her brother.  ROS:   Please see the history of present illness.     All other systems reviewed and are negative.  EKGs/Labs/Other Studies Reviewed:    The  following studies were reviewed today:   EKG:  EKG not ordered today.   Recent Labs: 01/27/2020: Hemoglobin 11.9; Platelet Count 130 05/02/2020: ALT 22; BUN 22; Creatinine, Ser 0.98; Potassium 4.2; Sodium 138  Recent Lipid Panel    Component Value Date/Time   CHOL 161 10/12/2020 1029   CHOL 169 06/23/2020 0813   TRIG 165 (H) 10/12/2020 1029   HDL 48 10/12/2020 1029   HDL 37 (L) 06/23/2020 0813   CHOLHDL 3.4 10/12/2020 1029   VLDL 33 10/12/2020 1029   LDLCALC 80 10/12/2020 1029   LDLCALC 85 06/23/2020 0813   LDLDIRECT 57.0 05/02/2020 0736    Physical Exam:    VS:  BP 120/64 (BP Location: Right Arm, Patient Position: Sitting, Cuff Size: Normal)   Pulse 86   Ht 5\' 2"  (1.575 m)   Wt 174 lb (78.9 kg)   LMP 08/26/1992 (Approximate)   SpO2 96%   BMI 31.83 kg/m     Wt Readings from Last 3 Encounters:  10/16/20 174 lb (78.9 kg)  08/31/20 166 lb 6.4 oz (75.5 kg)  08/11/20 164 lb (74.4 kg)     GEN:  Well nourished, well developed in no acute distress HEENT: Normal NECK: No JVD; No carotid bruits LYMPHATICS: No lymphadenopathy CARDIAC: RRR, no murmurs, rubs, gallops RESPIRATORY:  Clear to auscultation without rales, wheezing or rhonchi  ABDOMEN: Soft, non-tender, non-distended MUSCULOSKELETAL:  No edema; No deformity  SKIN: Warm and dry NEUROLOGIC:  Alert and oriented x 3 PSYCHIATRIC:  Normal affect   ASSESSMENT:    1. Coronary artery disease involving native heart, unspecified vessel or lesion type, unspecified whether angina present   2. Essential hypertension   3. Pure hypercholesterolemia    PLAN:    In order of problems listed above:  1. Chest pain, history of CAD, calcified plaque noted in the LM, LAD, RCA on  CT scan.  Lexiscan Myoview with no evidence for ischemia.  Denies chest pain.  Continue aspirin 81 mg, Lipitor to 20 mg daily.   2. Hypertension, BP controlled, continue losartan, HCTZ. 3. History of hyperlipidemia, hypertriglyceridemia.  Triglycerides  levels significantly improved from prior.  Continue Lipitor and fenofibrate.  Repeat lipid profile in 1 year  Follow-up in 12 months after fasting lipid profile  This note was generated in part or whole with voice recognition software. Voice recognition is usually quite accurate but there are transcription errors that can and very often do occur.  I apologize for any typographical errors that were not detected and corrected.  Medication Adjustments/Labs and Tests Ordered: Current medicines are reviewed at length with the patient today.  Concerns regarding medicines are outlined above.  Orders Placed This Encounter  Procedures  . Lipid panel   Meds ordered this encounter  Medications  . fenofibrate (TRICOR) 145 MG tablet    Sig: Take 1 tablet (145 mg total) by mouth daily.    Dispense:  90 tablet    Refill:  3    Patient Instructions  Medication Instructions:  Your physician recommends that you continue on your current medications as directed. Please refer to the Current Medication list given to you today.  *If you need a refill on your cardiac medications before your next appointment, please call your pharmacy*  Lab Work: Your physician recommends that you return for lab work in: 12 months sometime within the week prior to your appointment to check your cholesterol.  - You will need to be fasting. Please do not have anything to eat or drink after midnight the morning you have the lab work. You may only have water or black coffee with no cream or sugar. - Please go to the [facility] You will check in at the front desk to the right as you walk into the atrium. Valet Parking is offered if needed. - No appointment needed. You may go any day between 7 am and 6 pm.  If you have labs (blood work) drawn today and your tests are completely normal, you will receive your results only by: [name] MyChart Message (if you have MyChart) OR . A paper copy in the mail If you have any lab test  that is abnormal or we need to change your treatment, we will call you to review the results.  Testing/Procedures: none  Follow-Up: At [facility], you and your health needs are our priority.  As part of our continuing mission to provide you with exceptional heart care, we have created designated Provider Care Teams.  These Care Teams include your primary Cardiologist (physician) and Advanced Practice Providers (APPs -  Physician Assistants and Nurse Practitioners) who all work together to provide you with the care you need, when you need it.  We recommend signing up for the patient portal called "MyChart".  Sign up information is provided on this After Visit Summary.  MyChart is used to connect with patients for Virtual Visits (Telemedicine).  Patients are able to view lab/test results, encounter notes, upcoming appointments, etc.  Non-urgent messages can be sent to your provider as well.   To learn more about what you can do with MyChart, go to [email or web address].    Your next appointment:   12 month(s)  The format for your next appointment:   In Person  Provider:   You may see Kate Sable, MD or one of the following Advanced Practice Providers on your designated Care Team:    Murray Hodgkins, NP  Christell Faith, PA-C  Marrianne Mood, PA-C  Cadence Jardine, [place]  Laurann Montana, NP      Signed, Kate Sable, MD  10/16/2020 12:06 PM    [place]

## 2020-10-16 NOTE — Patient Instructions (Signed)
Medication Instructions:  Your physician recommends that you continue on your current medications as directed. Please refer to the Current Medication list given to you today.  *If you need a refill on your cardiac medications before your next appointment, please call your pharmacy*  Lab Work: Your physician recommends that you return for lab work in: 12 months sometime within the week prior to your appointment to check your cholesterol.  - You will need to be fasting. Please do not have anything to eat or drink after midnight the morning you have the lab work. You may only have water or black coffee with no cream or sugar. - Please go to the [facility]. You will check in at the front desk to the right as you walk into the atrium. Valet Parking is offered if needed. - No appointment needed. You may go any day between 7 am and 6 pm.  If you have labs (blood work) drawn today and your tests are completely normal, you will receive your results only by: [name] MyChart Message (if you have MyChart) OR . A paper copy in the mail If you have any lab test that is abnormal or we need to change your treatment, we will call you to review the results.  Testing/Procedures: none  Follow-Up: At [facility], you and your health needs are our priority.  As part of our continuing mission to provide you with exceptional heart care, we have created designated Provider Care Teams.  These Care Teams include your primary Cardiologist (physician) and Advanced Practice Providers (APPs -  Physician Assistants and Nurse Practitioners) who all work together to provide you with the care you need, when you need it.  We recommend signing up for the patient portal called "MyChart".  Sign up information is provided on this After Visit Summary.  MyChart is used to connect with patients for Virtual Visits (Telemedicine).  Patients are able to view lab/test results, encounter notes, upcoming appointments, etc.  Non-urgent  messages can be sent to your provider as well.   To learn more about what you can do with MyChart, go to [email or web address].    Your next appointment:   12 month(s)  The format for your next appointment:   In Person  Provider:   You may see Kate Sable, MD or one of the following Advanced Practice Providers on your designated Care Team:    Murray Hodgkins, NP  Christell Faith, PA-C  Marrianne Mood, PA-C  Cadence Amery, [place]  Laurann Montana, NP

## 2020-10-18 NOTE — Telephone Encounter (Signed)
I do not think that this is coming from the [place] however, people react differently to medications.  Recommend taking a "vacation" from the [facility] for at least a couple of weeks to see how she does.  If her cough returns we can try a different inhaler at a lower dose.

## 2020-10-18 NOTE — Telephone Encounter (Signed)
LG, please advise. Thanks 

## 2020-10-25 ENCOUNTER — Encounter: Payer: Self-pay | Admitting: Family Medicine

## 2020-11-01 DIAGNOSIS — D485 Neoplasm of uncertain behavior of skin: Secondary | ICD-10-CM | POA: Diagnosis not present

## 2020-11-01 DIAGNOSIS — L668 Other cicatricial alopecia: Secondary | ICD-10-CM | POA: Diagnosis not present

## 2020-11-01 DIAGNOSIS — D1801 Hemangioma of skin and subcutaneous tissue: Secondary | ICD-10-CM | POA: Diagnosis not present

## 2020-11-09 ENCOUNTER — Encounter: Payer: Self-pay | Admitting: Family Medicine

## 2020-11-09 ENCOUNTER — Other Ambulatory Visit: Payer: Self-pay

## 2020-11-09 ENCOUNTER — Ambulatory Visit (INDEPENDENT_AMBULATORY_CARE_PROVIDER_SITE_OTHER): Payer: Medicare Other | Admitting: Family Medicine

## 2020-11-09 VITALS — BP 130/60 | HR 89 | Temp 97.6°F | Ht 62.0 in | Wt 170.6 lb

## 2020-11-09 DIAGNOSIS — E1169 Type 2 diabetes mellitus with other specified complication: Secondary | ICD-10-CM | POA: Diagnosis not present

## 2020-11-09 DIAGNOSIS — E785 Hyperlipidemia, unspecified: Secondary | ICD-10-CM

## 2020-11-09 DIAGNOSIS — E113293 Type 2 diabetes mellitus with mild nonproliferative diabetic retinopathy without macular edema, bilateral: Secondary | ICD-10-CM

## 2020-11-09 DIAGNOSIS — E1159 Type 2 diabetes mellitus with other circulatory complications: Secondary | ICD-10-CM

## 2020-11-09 DIAGNOSIS — I2510 Atherosclerotic heart disease of native coronary artery without angina pectoris: Secondary | ICD-10-CM

## 2020-11-09 DIAGNOSIS — I152 Hypertension secondary to endocrine disorders: Secondary | ICD-10-CM

## 2020-11-09 LAB — POCT GLYCOSYLATED HEMOGLOBIN (HGB A1C): Hemoglobin A1C: 6.4 % — AB (ref 4.0–5.6)

## 2020-11-09 LAB — HM DIABETES FOOT EXAM

## 2020-11-09 MED ORDER — TRULICITY 0.75 MG/0.5ML ~~LOC~~ SOAJ: 0.7500 mg | SUBCUTANEOUS | 5 refills | Status: DC

## 2020-11-09 MED ORDER — VALACYCLOVIR HCL 500 MG PO TABS: 500.0000 mg | ORAL_TABLET | Freq: Two times a day (BID) | ORAL | 2 refills | Status: DC

## 2020-11-09 NOTE — Patient Instructions (Addendum)
Stop metformin and start weekly Trulicity. Work on low [employer], increase exercise and work on weight loss. Take valcyclovir twice daily x 3 days for flare.

## 2020-11-09 NOTE — Assessment & Plan Note (Signed)
Stable, chronic.  Continue current medication. ? ? ?Atorvastatin 20 mg daily ?

## 2020-11-09 NOTE — Assessment & Plan Note (Addendum)
A1C is stable but CBGs in last several weeks elevated... she had restarted metformin ut is not tolerating given bloating.  CBGs not coming down much on low dose metfomrin.   Will stop metformin and start (GLP1) Trulicity given pt history of of CAD.  GFR 55.

## 2020-11-09 NOTE — Progress Notes (Signed)
Patient ID: Katie [name], female    DOB: Aug 23, 1946, 75 y.o.   MRN: [id number]  This visit was conducted in person.  LMP 08/26/1992 (Approximate)    CC:  Chief Complaint  Patient presents with  . Diabetes    Subjective:   HPI: Katie [name] is a 75 y.o. 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   Diabetes:   On metformin 500 mg BID.[name] was off but started back  A s CBG started going back up with 10 lb weight gain.   She has not noted CBGs decreasing and metfomrin is giving her SE. Lab Results  Component Value Date   HGBA1C 6.4 (A) 11/09/2020  Using medications without difficulties: bloating limiting dose... bothering her a lot. Hypoglycemic episodes: none Hyperglycemic episodes: occ after meals Feet problems: Blood Sugars averaging: 128-153 eye exam within last year: 10/06/2020  Wt Readings from Last 3 Encounters:  11/09/20 170 lb 9 oz (77.4 kg)  10/16/20 174 lb (78.9 kg)  08/31/20 166 lb 6.4 oz (75.5 kg)    Elevated Cholesterol:  LDL at goal on atorvastatin 20 mg daily and fenofibrate. Lab Results  Component Value Date   CHOL 161 10/12/2020   HDL 48 10/12/2020   LDLCALC 80 10/12/2020   LDLDIRECT 57.0 05/02/2020   TRIG 165 (H) 10/12/2020   CHOLHDL 3.4 10/12/2020  Using medications without problems: Muscle aches:  Diet compliance: Exercise: Other complaints:      Relevant past medical, surgical, family and social history reviewed and updated as indicated. Interim medical history since our last visit reviewed. Allergies and medications reviewed and updated. Outpatient Medications Prior to Visit  Medication  Sig Dispense Refill  . alendronate (FOSAMAX) 70 MG tablet Take 1 tablet (70 mg total) by mouth once a week. Take with a full glass of water on an empty stomach. 12 tablet 3  . anastrozole (ARIMIDEX) 1 MG tablet TAKE 1 TABLET(1 MG) BY MOUTH DAILY 90 tablet 3  . aspirin 81 MG EC tablet Take 81 mg by mouth daily. Swallow whole.    [name] atorvastatin (LIPITOR) 20 MG tablet Take 1 tablet (20 mg total) by mouth daily. 90 tablet 3  . Calcium Carbonate-Vit D-Min (CALTRATE 600+D PLUS MINERALS) 600-800 MG-UNIT TABS Take 1 tablet by mouth in the morning and at bedtime.    . clobetasol (TEMOVATE) 0.05 % external solution Apply 1 application topically 2 (two) times daily as needed (scalp). Reported on 08/16/2015    . fenofibrate (TRICOR) 145 MG tablet Take 1 tablet (145 mg total) by mouth daily. 90 tablet 3  . fluticasone (FLONASE) 50 MCG/ACT nasal spray SHAKE LIQUID AND USE 2 SPRAYS IN EACH NOSTRIL EVERY DAY 16 g 5  . Fluticasone Furoate (ARNUITY ELLIPTA) 100 MCG/ACT AEPB Inhale 1 puff into the lungs daily. 30 each 6  . gabapentin (NEURONTIN) 300 MG capsule TAKE 1 CAPSULE(300 MG) BY MOUTH AT BEDTIME 90 capsule 3  . glucose blood (FREESTYLE LITE) test strip USE TO CHECK BLOOD SUGAR DAILY 50 strip 11  . loratadine (CLARITIN) 10 MG tablet Take 10 mg by mouth daily.    [name] losartan-hydrochlorothiazide (HYZAAR) 100-25 MG tablet Take 1  tablet by mouth daily. 90 tablet 3  . metFORMIN (GLUCOPHAGE) 500 MG tablet TAKE 1 TABLET(500 MG) BY MOUTH TWICE DAILY WITH A MEAL 60 tablet 3  . Multiple Vitamins-Minerals (CENTRUM SILVER 50+WOMEN) TABS Take 1 tablet by mouth daily.    Vladimir Faster Glycol-Propyl Glycol (SYSTANE OP) Apply to eye.     No facility-administered medications prior to visit.     Per HPI unless specifically indicated in ROS section below Review of Systems  Constitutional: Negative for fatigue and fever.  HENT: Negative for ear pain.   Eyes: Negative for pain.  Respiratory: Negative for chest tightness and  shortness of breath.   Cardiovascular: Negative for chest pain, palpitations and leg swelling.  Gastrointestinal: Positive for abdominal distention. Negative for abdominal pain.  Genitourinary: Negative for dysuria.   Objective:  LMP 08/26/1992 (Approximate)   Wt Readings from Last 3 Encounters:  10/16/20 174 lb (78.9 kg)  08/31/20 166 lb 6.4 oz (75.5 kg)  08/11/20 164 lb (74.4 kg)      Physical Exam Constitutional:      General: She is not in acute distress.    Appearance: Normal appearance. She is well-developed. She is obese. She is not ill-appearing or toxic-appearing.  HENT:     Head: Normocephalic.     Right Ear: Hearing, tympanic membrane, ear canal and external ear normal. Tympanic membrane is not erythematous, retracted or bulging.     Left Ear: Hearing, tympanic membrane, ear canal and external ear normal. Tympanic membrane is not erythematous, retracted or bulging.     Nose: No mucosal edema or rhinorrhea.     Right Sinus: No maxillary sinus tenderness or frontal sinus tenderness.     Left Sinus: No maxillary sinus tenderness or frontal sinus tenderness.     Mouth/Throat:     Pharynx: Uvula midline.  Eyes:     General: Lids are normal. Lids are everted, no foreign bodies appreciated.     Conjunctiva/sclera: Conjunctivae normal.     Pupils: Pupils are equal, round, and reactive to light.  Neck:     Thyroid: No thyroid mass or thyromegaly.     Vascular: No carotid bruit.     Trachea: Trachea normal.  Cardiovascular:     Rate and Rhythm: Normal rate and regular rhythm.     Pulses: Normal pulses.     Heart sounds: Normal heart sounds, S1 normal and S2 normal. No murmur heard. No friction rub. No gallop.   Pulmonary:     Effort: Pulmonary effort is normal. No tachypnea or respiratory distress.     Breath sounds: Normal breath sounds. No decreased breath sounds, wheezing, rhonchi or rales.  Abdominal:     General: Bowel sounds are normal.     Palpations: Abdomen is  soft.     Tenderness: There is no abdominal tenderness.  Musculoskeletal:     Cervical back: Normal range of motion and neck supple.  Skin:    General: Skin is warm and dry.     Findings: No rash.  Neurological:     Mental Status: She is alert.  Psychiatric:        Mood and Affect: Mood is not anxious or depressed.        Speech: Speech normal.        Behavior: Behavior normal. Behavior is cooperative.        Thought Content: Thought content normal.        Judgment: Judgment normal.       Diabetic foot exam:  Normal inspection No skin breakdown No calluses  Normal DP pulses Normal sensation to light touch and monofilament Nails normal  Results for orders placed or performed in visit on 10/25/20  HM DIABETES EYE EXAM  Result Value Ref Range   HM Diabetic Eye Exam Retinopathy (A) No Retinopathy    This visit occurred during the SARS-CoV-2 public health emergency.  Safety protocols were in place, including screening questions prior to the visit, additional usage of staff PPE, and extensive cleaning of exam room while observing appropriate contact time as indicated for disinfecting solutions.   COVID 19 screen:  No recent travel or known exposure to COVID19 The patient denies respiratory symptoms of COVID 19 at this time. The importance of social distancing was discussed today.   Assessment and Plan     Eliezer Lofts, MD

## 2020-11-13 NOTE — Telephone Encounter (Signed)
I would say lets stay on the [place] since it is the simplest thing.  And it should hopefully help her cough as well.  Make sure she rinses her mouth well after she uses it.

## 2020-11-13 NOTE — Telephone Encounter (Signed)
Dr. Gonzalez, please advise. thanks 

## 2020-11-16 ENCOUNTER — Telehealth: Payer: Self-pay | Admitting: Cardiology

## 2020-11-16 ENCOUNTER — Other Ambulatory Visit: Payer: Self-pay | Admitting: Family Medicine

## 2020-11-16 MED ORDER — ATORVASTATIN CALCIUM 20 MG PO TABS: 20.0000 mg | ORAL_TABLET | Freq: Every day | ORAL | 3 refills | Status: DC

## 2020-11-16 NOTE — Telephone Encounter (Signed)
Received fax from [facility] [address] in [place] requesting refills for Atorvastatin 20 mg. Rx request sent to pharmacy.

## 2020-11-23 ENCOUNTER — Ambulatory Visit (INDEPENDENT_AMBULATORY_CARE_PROVIDER_SITE_OTHER): Payer: Medicare Other | Admitting: Family Medicine

## 2020-11-23 ENCOUNTER — Other Ambulatory Visit: Payer: Self-pay

## 2020-11-23 DIAGNOSIS — I2510 Atherosclerotic heart disease of native coronary artery without angina pectoris: Secondary | ICD-10-CM

## 2020-11-23 DIAGNOSIS — E113293 Type 2 diabetes mellitus with mild nonproliferative diabetic retinopathy without macular edema, bilateral: Secondary | ICD-10-CM | POA: Diagnosis not present

## 2020-11-23 NOTE — Assessment & Plan Note (Signed)
Good control off metformin and on new start Trulicity... she has also had some weight loss.

## 2020-11-23 NOTE — Progress Notes (Signed)
Patient ID: Katie [name], female    DOB: [birth year], 75 y.o.   MRN: [id number]  This visit was conducted in person.  BP (!) 116/50   Pulse 72   Temp (!) 97.3 F (36.3 C) (Temporal)   Ht 5\' 2"  (1.575 m)   Wt 168 lb 12 oz (76.5 kg)   LMP 08/26/1992 (Approximate)   SpO2 95%   BMI 30.86 kg/m    CC:  Chief Complaint  Patient presents with  . Follow-up    2 week after starting trulicity     Subjective:   HPI: Katie [name] is a 75 y.o. female presenting on 11/23/2020 for Follow-up (2 week after starting trulicity )  Controlled DM type 2:  Started on Trulicity at last OV 2 weeks ago in place of metformin given weight gain and SE to metfomrin.   Today she reports CBG, fasting:113-129 postprandial:96-159  No SE to the trulicity.  off metformin.[name] no further bloating and abd discomfort. Wt Readings from Last 3 Encounters:  11/23/20 168 lb 12 oz (76.5 kg)  11/09/20 170 lb 9 oz (77.4 kg)  10/16/20 174 lb (78.9 kg)   BP Readings from Last 3 Encounters:  11/23/20 (!) 116/50  11/09/20 130/60  10/16/20 120/64       Relevant past medical, surgical, family and social history reviewed and updated as indicated. Interim medical history since our last visit reviewed. Allergies and medications reviewed and updated. Outpatient Medications Prior to Visit  Medication Sig Dispense Refill  . alendronate (FOSAMAX) 70 MG tablet Take 1 tablet (70 mg total) by mouth once a week. Take with a full glass of water on an empty stomach. 12 tablet 3  . anastrozole (ARIMIDEX) 1 MG tablet TAKE 1 TABLET(1 MG) BY MOUTH DAILY 90 tablet 3  . aspirin 81 MG EC tablet Take 81 mg by mouth daily. Swallow whole.    [name] atorvastatin (LIPITOR) 20 MG tablet Take 1 tablet (20 mg total) by mouth daily. 90 tablet 3  . Calcium Carbonate-Vit D-Min (CALTRATE 600+D PLUS MINERALS) 600-800 MG-UNIT TABS Take 1 tablet by mouth in the morning and at bedtime.    . clobetasol (TEMOVATE) 0.05 % external solution Apply 1  application topically 2 (two) times daily as needed (scalp). Reported on 08/16/2015    . Dulaglutide (TRULICITY) 3.32 RJ/1.8AC SOPN Inject 0.75 mg into the skin once a week. 3 mL 5  . fenofibrate (TRICOR) 145 MG tablet Take 1 tablet (145 mg total) by mouth daily. 90 tablet 3  . fluticasone (FLONASE) 50 MCG/ACT nasal spray SHAKE LIQUID AND USE 2 SPRAYS IN EACH NOSTRIL EVERY DAY 16 g 5  . Fluticasone Furoate (ARNUITY ELLIPTA) 100 MCG/ACT AEPB Inhale 1 puff into the lungs daily. 30 each 6  . gabapentin (NEURONTIN) 300 MG capsule TAKE 1 CAPSULE(300 MG) BY MOUTH AT BEDTIME 90 capsule 3  . glucose blood (FREESTYLE LITE) test strip USE TO CHECK BLOOD SUGAR DAILY 50 strip 11  . loratadine (CLARITIN) 10 MG tablet Take 10 mg by mouth daily.    [name] losartan-hydrochlorothiazide (HYZAAR) 100-25 MG tablet TAKE 1 TABLET BY MOUTH DAILY 90 tablet 3  . Multiple Vitamins-Minerals (CENTRUM SILVER 50+WOMEN) TABS Take 1 tablet by mouth daily.    Vladimir Faster Glycol-Propyl Glycol (SYSTANE OP) Apply to eye.    . valACYclovir (VALTREX) 500 MG tablet Take 1 tablet (500 mg total) by mouth 2 (two) times daily. 6 tablet 2   No facility-administered medications prior to visit.  Per HPI unless specifically indicated in ROS section below Review of Systems  Constitutional: Positive for fatigue. Negative for fever.       Mild fatigue,Noted in last few weeks  HENT: Negative for congestion.   Eyes: Negative for pain.  Respiratory: Negative for cough and shortness of breath.   Cardiovascular: Negative for chest pain, palpitations and leg swelling.  Gastrointestinal: Negative for abdominal pain.  Genitourinary: Negative for dysuria and vaginal bleeding.  Musculoskeletal: Negative for back pain.  Neurological: Negative for syncope, light-headedness and headaches.  Psychiatric/Behavioral: Negative for dysphoric mood.   Objective:  BP (!) 116/50   Pulse 72   Temp (!) 97.3 F (36.3 C) (Temporal)   Ht 5\' 2"  (1.575 m)   Wt  168 lb 12 oz (76.5 kg)   LMP 08/26/1992 (Approximate)   SpO2 95%   BMI 30.86 kg/m   Wt Readings from Last 3 Encounters:  11/23/20 168 lb 12 oz (76.5 kg)  11/09/20 170 lb 9 oz (77.4 kg)  10/16/20 174 lb (78.9 kg)      Physical Exam Constitutional:      General: She is not in acute distress.    Appearance: Normal appearance. She is well-developed. She is not ill-appearing or toxic-appearing.  HENT:     Head: Normocephalic.     Right Ear: Hearing, tympanic membrane, ear canal and external ear normal. Tympanic membrane is not erythematous, retracted or bulging.     Left Ear: Hearing, tympanic membrane, ear canal and external ear normal. Tympanic membrane is not erythematous, retracted or bulging.     Nose: No mucosal edema or rhinorrhea.     Right Sinus: No maxillary sinus tenderness or frontal sinus tenderness.     Left Sinus: No maxillary sinus tenderness or frontal sinus tenderness.     Mouth/Throat:     Pharynx: Uvula midline.  Eyes:     General: Lids are normal. Lids are everted, no foreign bodies appreciated.     Conjunctiva/sclera: Conjunctivae normal.     Pupils: Pupils are equal, round, and reactive to light.  Neck:     Thyroid: No thyroid mass or thyromegaly.     Vascular: No carotid bruit.     Trachea: Trachea normal.  Cardiovascular:     Rate and Rhythm: Normal rate and regular rhythm.     Pulses: Normal pulses.     Heart sounds: Normal heart sounds, S1 normal and S2 normal. No murmur heard. No friction rub. No gallop.   Pulmonary:     Effort: Pulmonary effort is normal. No tachypnea or respiratory distress.     Breath sounds: Normal breath sounds. No decreased breath sounds, wheezing, rhonchi or rales.  Abdominal:     General: Bowel sounds are normal.     Palpations: Abdomen is soft.     Tenderness: There is no abdominal tenderness.  Musculoskeletal:     Cervical back: Normal range of motion and neck supple.  Skin:    General: Skin is warm and dry.      Findings: No rash.  Neurological:     Mental Status: She is alert.  Psychiatric:        Mood and Affect: Mood is not anxious or depressed.        Speech: Speech normal.        Behavior: Behavior normal. Behavior is cooperative.        Thought Content: Thought content normal.        Judgment: Judgment normal.  Results for orders placed or performed in visit on 11/09/20  POCT glycosylated hemoglobin (Hb A1C)  Result Value Ref Range   Hemoglobin A1C 6.4 (A) 4.0 - 5.6 %   HbA1c POC (<> result, manual entry)     HbA1c, POC (prediabetic range)     HbA1c, POC (controlled diabetic range)    HM DIABETES FOOT EXAM  Result Value Ref Range   HM Diabetic Foot Exam done     This visit occurred during the SARS-CoV-2 public health emergency.  Safety protocols were in place, including screening questions prior to the visit, additional usage of staff PPE, and extensive cleaning of exam room while observing appropriate contact time as indicated for disinfecting solutions.   COVID 19 screen:  No recent travel or known exposure to COVID19 The patient denies respiratory symptoms of COVID 19 at this time. The importance of social distancing was discussed today.   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 she has also had some weight loss.           Eliezer Lofts, MD

## 2020-11-23 NOTE — Patient Instructions (Signed)
Continue current dose of Trulicity.

## 2020-11-29 ENCOUNTER — Ambulatory Visit: Payer: Medicare Other | Attending: Internal Medicine

## 2020-11-29 DIAGNOSIS — Z23 Encounter for immunization: Secondary | ICD-10-CM

## 2020-11-29 NOTE — Progress Notes (Signed)
   Covid-19 Vaccination Clinic  Name:  Katie [name]    MRN: [id number] DOB: [birth year]  11/29/2020  [name] was observed post Covid-19 immunization for 15 minutes without incident. She was provided with Vaccine Information Sheet and instruction to access the V-Safe system.   [name] was instructed to call 911 with any severe reactions post vaccine: [name] Difficulty breathing  . Swelling of face and throat  . A fast heartbeat  . A bad rash all over body  . Dizziness and weakness   Immunizations Administered    Name Date Dose VIS Date Route   PFIZER Comrnaty(Gray TOP) Covid-19 Vaccine 11/29/2020  1:06 PM 0.3 mL 08/03/2020 Intramuscular   Manufacturer: [employer], [employer]   Lot: [id number]   [place]: [phone]

## 2020-12-04 ENCOUNTER — Other Ambulatory Visit: Payer: Self-pay

## 2020-12-04 MED ORDER — PFIZER-BIONT COVID-19 VAC-TRIS 30 MCG/0.3ML IM SUSP
INTRAMUSCULAR | 0 refills | Status: DC
  Filled 2020-12-04: qty 0.3, 20d supply, fill #0

## 2021-01-25 ENCOUNTER — Telehealth: Payer: Self-pay | Admitting: Hematology and Oncology

## 2021-01-25 NOTE — Telephone Encounter (Signed)
R/s per prov pal, per 12/6 los ,pt aware

## 2021-01-27 ENCOUNTER — Other Ambulatory Visit: Payer: Self-pay | Admitting: Family Medicine

## 2021-02-02 DIAGNOSIS — L905 Scar conditions and fibrosis of skin: Secondary | ICD-10-CM | POA: Diagnosis not present

## 2021-02-02 DIAGNOSIS — L668 Other cicatricial alopecia: Secondary | ICD-10-CM | POA: Diagnosis not present

## 2021-02-14 ENCOUNTER — Telehealth: Payer: Self-pay | Admitting: *Deleted

## 2021-02-14 NOTE — Telephone Encounter (Signed)
Received fax from [facility] requesting PA for Freestyle Test Strips.  Called and spoke with Ms. Standish.  She is aware her insurance does not cover Freestyle.  Patient states she likes the Freestyle so she just pays out of pocket for the test strips.

## 2021-02-16 DIAGNOSIS — C50912 Malignant neoplasm of unspecified site of left female breast: Secondary | ICD-10-CM | POA: Diagnosis not present

## 2021-02-18 ENCOUNTER — Telehealth: Payer: Self-pay | Admitting: Family Medicine

## 2021-02-18 DIAGNOSIS — E113293 Type 2 diabetes mellitus with mild nonproliferative diabetic retinopathy without macular edema, bilateral: Secondary | ICD-10-CM

## 2021-02-18 NOTE — Telephone Encounter (Signed)
-----   Message from Cloyd Stagers, RT sent at 02/05/2021 10:45 AM EDT ----- Regarding: Lab Orders for Monday 6.27.2022 Please place lab orders for Monday 6.27.2022, office visit for 3 month f/u on Friday 7.1.2022 Thank you, Dyke Maes RT(R)

## 2021-02-19 ENCOUNTER — Other Ambulatory Visit: Payer: Self-pay

## 2021-02-19 ENCOUNTER — Other Ambulatory Visit (INDEPENDENT_AMBULATORY_CARE_PROVIDER_SITE_OTHER): Payer: Medicare Other

## 2021-02-19 DIAGNOSIS — E113293 Type 2 diabetes mellitus with mild nonproliferative diabetic retinopathy without macular edema, bilateral: Secondary | ICD-10-CM | POA: Diagnosis not present

## 2021-02-19 LAB — LIPID PANEL
Cholesterol: 135 mg/dL (ref 0–200)
HDL: 43.8 mg/dL (ref >39.00)
LDL Cholesterol: 58 mg/dL (ref 0–99)
NonHDL: 90.74
Total CHOL/HDL Ratio: 3
Triglycerides: 165 mg/dL — ABNORMAL HIGH (ref 0.0–149.0)
VLDL: 33 mg/dL (ref 0.0–40.0)

## 2021-02-19 LAB — CBC WITH DIFFERENTIAL/PLATELET
Basophils Absolute: 0 10*3/uL (ref 0.0–0.1)
Basophils Relative: 0.5 % (ref 0.0–3.0)
Eosinophils Absolute: 0.1 10*3/uL (ref 0.0–0.7)
Eosinophils Relative: 2.4 % (ref 0.0–5.0)
HCT: 30.3 % — ABNORMAL LOW (ref 36.0–46.0)
Hemoglobin: 10.7 g/dL — ABNORMAL LOW (ref 12.0–15.0)
Lymphocytes Relative: 21.6 % (ref 12.0–46.0)
Lymphs Abs: 0.8 10*3/uL (ref 0.7–4.0)
MCHC: 35.3 g/dL (ref 30.0–36.0)
MCV: 101.6 fl — ABNORMAL HIGH (ref 78.0–100.0)
Monocytes Absolute: 0.5 10*3/uL (ref 0.1–1.0)
Monocytes Relative: 13 % — ABNORMAL HIGH (ref 3.0–12.0)
Neutro Abs: 2.4 10*3/uL (ref 1.4–7.7)
Neutrophils Relative %: 62.5 % (ref 43.0–77.0)
Platelets: 212 10*3/uL (ref 150.0–400.0)
RBC: 2.99 Mil/uL — ABNORMAL LOW (ref 3.87–5.11)
RDW: 15 % (ref 11.5–15.5)
WBC: 3.9 10*3/uL — ABNORMAL LOW (ref 4.0–10.5)

## 2021-02-19 LAB — COMPREHENSIVE METABOLIC PANEL
ALT: 25 U/L (ref 0–35)
AST: 26 U/L (ref 0–37)
Albumin: 4.3 g/dL (ref 3.5–5.2)
Alkaline Phosphatase: 30 U/L — ABNORMAL LOW (ref 39–117)
BUN: 19 mg/dL (ref 6–23)
CO2: 26 mEq/L (ref 19–32)
Calcium: 10.9 mg/dL — ABNORMAL HIGH (ref 8.4–10.5)
Chloride: 99 mEq/L (ref 96–112)
Creatinine, Ser: 1.16 mg/dL (ref 0.40–1.20)
GFR: 45.89 mL/min — ABNORMAL LOW (ref >60.00)
Glucose, Bld: 128 mg/dL — ABNORMAL HIGH (ref 70–99)
Potassium: 3.9 mEq/L (ref 3.5–5.1)
Sodium: 135 mEq/L (ref 135–145)
Total Bilirubin: 0.4 mg/dL (ref 0.2–1.2)
Total Protein: 6.9 g/dL (ref 6.0–8.3)

## 2021-02-19 LAB — HEMOGLOBIN A1C: Hgb A1c MFr Bld: 6.5 % (ref 4.6–6.5)

## 2021-02-19 LAB — LIPASE: Lipase: 13 U/L (ref 11.0–59.0)

## 2021-02-19 NOTE — Progress Notes (Signed)
No critical labs need to be addressed urgently. We will discuss labs in detail at upcoming office visit.   

## 2021-02-23 ENCOUNTER — Ambulatory Visit (INDEPENDENT_AMBULATORY_CARE_PROVIDER_SITE_OTHER): Payer: Medicare Other | Admitting: Family Medicine

## 2021-02-23 ENCOUNTER — Other Ambulatory Visit: Payer: Self-pay

## 2021-02-23 ENCOUNTER — Encounter: Payer: Self-pay | Admitting: Family Medicine

## 2021-02-23 VITALS — BP 130/72 | HR 67 | Temp 97.6°F | Ht 62.0 in | Wt 171.5 lb

## 2021-02-23 DIAGNOSIS — E113293 Type 2 diabetes mellitus with mild nonproliferative diabetic retinopathy without macular edema, bilateral: Secondary | ICD-10-CM | POA: Diagnosis not present

## 2021-02-23 DIAGNOSIS — E1169 Type 2 diabetes mellitus with other specified complication: Secondary | ICD-10-CM

## 2021-02-23 DIAGNOSIS — E538 Deficiency of other specified B group vitamins: Secondary | ICD-10-CM | POA: Diagnosis not present

## 2021-02-23 DIAGNOSIS — D649 Anemia, unspecified: Secondary | ICD-10-CM | POA: Diagnosis not present

## 2021-02-23 DIAGNOSIS — I2510 Atherosclerotic heart disease of native coronary artery without angina pectoris: Secondary | ICD-10-CM | POA: Diagnosis not present

## 2021-02-23 DIAGNOSIS — E785 Hyperlipidemia, unspecified: Secondary | ICD-10-CM | POA: Diagnosis not present

## 2021-02-23 DIAGNOSIS — E1159 Type 2 diabetes mellitus with other circulatory complications: Secondary | ICD-10-CM

## 2021-02-23 DIAGNOSIS — D72819 Decreased white blood cell count, unspecified: Secondary | ICD-10-CM

## 2021-02-23 DIAGNOSIS — R1011 Right upper quadrant pain: Secondary | ICD-10-CM | POA: Diagnosis not present

## 2021-02-23 DIAGNOSIS — I152 Hypertension secondary to endocrine disorders: Secondary | ICD-10-CM

## 2021-02-23 DIAGNOSIS — R944 Abnormal results of kidney function studies: Secondary | ICD-10-CM

## 2021-02-23 LAB — IBC + FERRITIN
Ferritin: 463.8 ng/mL — ABNORMAL HIGH (ref 10.0–291.0)
Iron: 118 ug/dL (ref 42–145)
Saturation Ratios: 22.8 % (ref 20.0–50.0)
Transferrin: 369 mg/dL — ABNORMAL HIGH (ref 212.0–360.0)

## 2021-02-23 LAB — VITAMIN B12: Vitamin B-12: 547 pg/mL (ref 211–911)

## 2021-02-23 NOTE — Assessment & Plan Note (Signed)
Followed closely by opthamologyt.   Due to DM.

## 2021-02-23 NOTE — Patient Instructions (Addendum)
Please stop at the lab to have labs drawn. Increase water as able.. given lower kidney function.

## 2021-02-23 NOTE — Assessment & Plan Note (Addendum)
Stable, chronic.  Continue current medication.   LDL at goal on atorvastatin 20 mg daily. Fenofibrate for high trigs.

## 2021-02-23 NOTE — Assessment & Plan Note (Signed)
Stable, chronic.  Continue current medication.   At goal on losartan HCTZ 100/25 mg daily

## 2021-02-23 NOTE — Progress Notes (Signed)
Patient ID: Katie [name], female    DOB: [birth year], 75 y.o.   MRN: [id number]  This visit was conducted in person.  BP 130/72   Pulse 67   Temp 97.6 F (36.4 C) (Temporal)   Ht 5\' 2"  (1.575 m)   Wt 171 lb 8 oz (77.8 kg)   LMP 08/26/1992 (Approximate)   SpO2 96%   BMI 31.37 kg/m    CC: Chief Complaint  Patient presents with   Diabetes    Subjective:   HPI: Katie [name] is a 75 y.o. female presenting on 02/23/2021 for Diabetes  Diabetes: Now on Trulicity x 4 months, low dose.. tolerating well.   She has had a a few episodes of diarrhea and is not sure if it is related to the medications.  Renal function has declined some.. not drinking as much water. Off and on bloating and RUQ pain... intermittently for years. Lab Results  Component Value Date   HGBA1C 6.5 02/19/2021  Using medications without difficulties: Hypoglycemic episodes:none Hyperglycemic episodes: none Feet problems: no ulcers Blood Sugars averaging: eye exam within last year:   Recent labs reviewed... wbc count again low and hemoglobin decreased in last year from 11.9 to 10.7 She is on Arimidex which can cause leukopenia and anemia.   Wt Readings from Last 3 Encounters:  02/23/21 171 lb 8 oz (77.8 kg)  11/23/20 168 lb 12 oz (76.5 kg)  11/09/20 170 lb 9 oz (77.4 kg)      Elevated Cholesterol:LDL at goal on atorvastatin 20 mg daily. Fenofibrate for high trigs. Lab Results  Component Value Date   CHOL 135 02/19/2021   HDL 43.80 02/19/2021   LDLCALC 58 02/19/2021   LDLDIRECT 57.0 05/02/2020   TRIG 165.0 (H) 02/19/2021   CHOLHDL 3 02/19/2021  Using medications without problems: Muscle aches:  Diet compliance: good Exercise: treadmill Other complaints:  Hypertension:  At goal on losartan HCTZ 100/25 mg daily BP Readings from Last 3 Encounters:  02/23/21 130/72  11/23/20 (!) 116/50  11/09/20 130/60  Using medication without problems or lightheadedness:  Chest pain with  exertion: Edema: Short of breath: Average home BPs: Other issues:    Relevant past medical, surgical, family and social history reviewed and updated as indicated. Interim medical history since our last visit reviewed. Allergies and medications reviewed and updated. Outpatient Medications Prior to Visit  Medication Sig Dispense Refill   alendronate (FOSAMAX) 70 MG tablet Take 1 tablet (70 mg total) by mouth once a week. Take with a full glass of water on an empty stomach. 12 tablet 3   anastrozole (ARIMIDEX) 1 MG tablet TAKE 1 TABLET(1 MG) BY MOUTH DAILY 90 tablet 3   aspirin 81 MG EC tablet Take 81 mg by mouth daily. Swallow whole.     atorvastatin (LIPITOR) 20 MG tablet Take 1 tablet (20 mg total) by mouth daily. 90 tablet 3   Calcium Carbonate-Vit D-Min (CALTRATE 600+D PLUS MINERALS) 600-800 MG-UNIT TABS Take 1 tablet by mouth in the morning and at bedtime.     clobetasol (TEMOVATE) 0.05 % external solution Apply 1 application topically 2 (two) times daily as needed (scalp). Reported on 08/16/2015     Dulaglutide (TRULICITY) 9.76 BH/4.1PF SOPN Inject 0.75 mg into the skin once a week. 3 mL 5   fenofibrate (TRICOR) 145 MG tablet Take 1 tablet (145 mg total) by mouth daily. 90 tablet 3   fluticasone (FLONASE) 50 MCG/ACT nasal spray SHAKE LIQUID AND USE 2 SPRAYS IN [facility]  NOSTRIL EVERY DAY 16 g 2   Fluticasone Furoate (ARNUITY ELLIPTA) 100 MCG/ACT AEPB Inhale 1 puff into the lungs daily. 30 each 6   gabapentin (NEURONTIN) 300 MG capsule TAKE 1 CAPSULE(300 MG) BY MOUTH AT BEDTIME 90 capsule 3   glucose blood (FREESTYLE LITE) test strip USE TO CHECK BLOOD SUGAR DAILY 50 strip 11   loratadine (CLARITIN) 10 MG tablet Take 10 mg by mouth daily.     losartan-hydrochlorothiazide (HYZAAR) 100-25 MG tablet TAKE 1 TABLET BY MOUTH DAILY 90 tablet 3   Multiple Vitamins-Minerals (CENTRUM SILVER 50+WOMEN) TABS Take 1 tablet by mouth daily.     Polyethyl Glycol-Propyl Glycol (SYSTANE OP) Apply to eye.      valACYclovir (VALTREX) 500 MG tablet Take 1 tablet (500 mg total) by mouth 2 (two) times daily. 6 tablet 2   COVID-19 mRNA Vac-TriS, Pfizer, (PFIZER-BIONT COVID-19 VAC-TRIS) SUSP injection Inject into the muscle. 0.3 mL 0   No facility-administered medications prior to visit.     Per HPI unless specifically indicated in ROS section below Review of Systems  Constitutional:  Negative for fatigue and fever.  HENT:  Negative for ear pain.   Eyes:  Negative for pain.  Respiratory:  Negative for chest tightness and shortness of breath.   Cardiovascular:  Negative for chest pain, palpitations and leg swelling.  Gastrointestinal:  Negative for abdominal pain.  Genitourinary:  Negative for dysuria.  Objective:  BP 130/72   Pulse 67   Temp 97.6 F (36.4 C) (Temporal)   Ht 5\' 2"  (1.575 m)   Wt 171 lb 8 oz (77.8 kg)   LMP 08/26/1992 (Approximate)   SpO2 96%   BMI 31.37 kg/m   Wt Readings from Last 3 Encounters:  02/23/21 171 lb 8 oz (77.8 kg)  11/23/20 168 lb 12 oz (76.5 kg)  11/09/20 170 lb 9 oz (77.4 kg)      Physical Exam Constitutional:      General: She is not in acute distress.    Appearance: Normal appearance. She is well-developed. She is not ill-appearing or toxic-appearing.  HENT:     Head: Normocephalic.     Right Ear: Hearing, tympanic membrane, ear canal and external ear normal. Tympanic membrane is not erythematous, retracted or bulging.     Left Ear: Hearing, tympanic membrane, ear canal and external ear normal. Tympanic membrane is not erythematous, retracted or bulging.     Nose: No mucosal edema or rhinorrhea.     Right Sinus: No maxillary sinus tenderness or frontal sinus tenderness.     Left Sinus: No maxillary sinus tenderness or frontal sinus tenderness.     Mouth/Throat:     Pharynx: Uvula midline.  Eyes:     General: Lids are normal. Lids are everted, no foreign bodies appreciated.     Conjunctiva/sclera: Conjunctivae normal.     Pupils: Pupils are equal,  round, and reactive to light.  Neck:     Thyroid: No thyroid mass or thyromegaly.     Vascular: No carotid bruit.     Trachea: Trachea normal.  Cardiovascular:     Rate and Rhythm: Normal rate and regular rhythm.     Pulses: Normal pulses.     Heart sounds: Normal heart sounds, S1 normal and S2 normal. No murmur heard.   No friction rub. No gallop.  Pulmonary:     Effort: Pulmonary effort is normal. No tachypnea or respiratory distress.     Breath sounds: Normal breath sounds. No decreased breath sounds, wheezing,  rhonchi or rales.  Abdominal:     General: Bowel sounds are normal.     Palpations: Abdomen is soft.     Tenderness: There is no abdominal tenderness.  Musculoskeletal:     Cervical back: Normal range of motion and neck supple.  Skin:    General: Skin is warm and dry.     Findings: No rash.  Neurological:     Mental Status: She is alert.  Psychiatric:        Mood and Affect: Mood is not anxious or depressed.        Speech: Speech normal.        Behavior: Behavior normal. Behavior is cooperative.        Thought Content: Thought content normal.        Judgment: Judgment normal.      Results for orders placed or performed in visit on 02/19/21  Lipase  Result Value Ref Range   Lipase 13.0 11.0 - 59.0 U/L  Comprehensive metabolic panel  Result Value Ref Range   Sodium 135 135 - 145 mEq/L   Potassium 3.9 3.5 - 5.1 mEq/L   Chloride 99 96 - 112 mEq/L   CO2 26 19 - 32 mEq/L   Glucose, Bld 128 (H) 70 - 99 mg/dL   BUN 19 6 - 23 mg/dL   Creatinine, Ser 1.16 0.40 - 1.20 mg/dL   Total Bilirubin 0.4 0.2 - 1.2 mg/dL   Alkaline Phosphatase 30 (L) 39 - 117 U/L   AST 26 0 - 37 U/L   ALT 25 0 - 35 U/L   Total Protein 6.9 6.0 - 8.3 g/dL   Albumin 4.3 3.5 - 5.2 g/dL   GFR 45.89 (L) >60.00 mL/min   Calcium 10.9 (H) 8.4 - 10.5 mg/dL  Lipid panel  Result Value Ref Range   Cholesterol 135 0 - 200 mg/dL   Triglycerides 165.0 (H) 0.0 - 149.0 mg/dL   HDL 43.80 >39.00 mg/dL    VLDL 33.0 0.0 - 40.0 mg/dL   LDL Cholesterol 58 0 - 99 mg/dL   Total CHOL/HDL Ratio 3    NonHDL 90.74   Hemoglobin A1c  Result Value Ref Range   Hgb A1c MFr Bld 6.5 4.6 - 6.5 %  CBC with Differential/Platelet  Result Value Ref Range   WBC 3.9 (L) 4.0 - 10.5 K/uL   RBC 2.99 (L) 3.87 - 5.11 Mil/uL   Hemoglobin 10.7 (L) 12.0 - 15.0 g/dL   HCT 30.3 (L) 36.0 - 46.0 %   MCV 101.6 (H) 78.0 - 100.0 fl   MCHC 35.3 30.0 - 36.0 g/dL   RDW 15.0 11.5 - 15.5 %   Platelets 212.0 150.0 - 400.0 K/uL   Neutrophils Relative % 62.5 43.0 - 77.0 %   Lymphocytes Relative 21.6 12.0 - 46.0 %   Monocytes Relative 13.0 (H) 3.0 - 12.0 %   Eosinophils Relative 2.4 0.0 - 5.0 %   Basophils Relative 0.5 0.0 - 3.0 %   Neutro Abs 2.4 1.4 - 7.7 K/uL   Lymphs Abs 0.8 0.7 - 4.0 K/uL   Monocytes Absolute 0.5 0.1 - 1.0 K/uL   Eosinophils Absolute 0.1 0.0 - 0.7 K/uL   Basophils Absolute 0.0 0.0 - 0.1 K/uL    This visit occurred during the SARS-CoV-2 public health emergency.  Safety protocols were in place, including screening questions prior to the visit, additional usage of staff PPE, and extensive cleaning of exam room while observing appropriate contact time as indicated for disinfecting  solutions.   COVID 19 screen:  No recent travel or known exposure to COVID19 The patient denies respiratory symptoms of COVID 19 at this time. The importance of social distancing was discussed today.   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  Continue current medication.   At goal on losartan HCTZ 100/25 mg daily      Anemia    wbc count again low and hemoglobin decreased in last year from 11.9 to 10.7 She is on Arimidex which can cause leukopenia and anemia.       Relevant Orders   IBC + Ferritin (Completed)   Decreased GFR   Diabetic  retinopathy ([place])    Followed closely by opthamologyt.   Due to DM.      Hyperlipidemia associated with type 2 diabetes mellitus ([facility])    Stable, chronic.  Continue current medication.   LDL at goal on atorvastatin 20 mg daily. Fenofibrate for high trigs.      Leukopenia   RUQ pain   Vitamin B 12 deficiency   Relevant Orders   Vitamin B12 (Completed)     Eliezer Lofts, MD

## 2021-02-28 ENCOUNTER — Encounter: Payer: Self-pay | Admitting: Pulmonary Disease

## 2021-03-03 ENCOUNTER — Other Ambulatory Visit: Payer: Self-pay | Admitting: Pulmonary Disease

## 2021-03-05 ENCOUNTER — Ambulatory Visit (INDEPENDENT_AMBULATORY_CARE_PROVIDER_SITE_OTHER): Payer: Medicare Other | Admitting: Pulmonary Disease

## 2021-03-05 ENCOUNTER — Other Ambulatory Visit: Payer: Self-pay

## 2021-03-05 ENCOUNTER — Encounter: Payer: Self-pay | Admitting: Pulmonary Disease

## 2021-03-05 VITALS — BP 128/74 | HR 69 | Temp 97.8°F | Ht 62.0 in | Wt 170.6 lb

## 2021-03-05 DIAGNOSIS — G4733 Obstructive sleep apnea (adult) (pediatric): Secondary | ICD-10-CM

## 2021-03-05 DIAGNOSIS — R059 Cough, unspecified: Secondary | ICD-10-CM

## 2021-03-05 DIAGNOSIS — J479 Bronchiectasis, uncomplicated: Secondary | ICD-10-CM | POA: Diagnosis not present

## 2021-03-05 DIAGNOSIS — I272 Pulmonary hypertension, unspecified: Secondary | ICD-10-CM

## 2021-03-05 NOTE — Patient Instructions (Signed)
Continue using Arnuity 1 puff daily, rinse well after use.  You are doing well with regards to your sleep apnea and CPAP.  We will see him in follow-up in 6 months time.  You should probably have another echocardiogram in another 6 months or so.

## 2021-03-05 NOTE — Progress Notes (Signed)
Subjective:    Patient ID: JAYLENA HOLLOWAY, female    DOB: [birth year], 75 y.o.   MRN: [id number] Chief Complaint  Patient presents with   Follow-up    C/o throat clearing. Wearing cpap avg 8-9hr. Pressure and mask is okay.    HPI Sherah is a 75 year old lifelong never smoker, with a history of HER-2 positive breast cancer status post bilateral mastectomies, radiation and neoadjuvant chemotherapy.  She was initially seen here on 26 January 2020 for abnormal chest x-ray and for issues with cough that started after initiation of anastrozole.  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  At this point inhaled corticosteroid therapy with Arnuity was started for her cough issue.  She has noted improvement of her cough with this.  Subsequently she has also been noted to have mild pulmonary hypertension and duration of this issue led to home sleep study which showed moderate sleep apnea.  She is currently using CPAP of auto CPAP at 5 to 20 cm H2O of pressure.  She notes that she continues to do well with this.  She feels that she gets restorative sleep.  She does not endorse dyspnea.  Cough continues to be controlled.  Does have mild throat clearing but this is related to her allergies and postnasal drip.  We reviewed her compliance report and she has been compliant with CPAP. Voices no other active complaint today.    DATA: 09/24/2019 2D echo: LVEF 60 to 65%, normal LV function, indeterminate LV diastolic parameters. Mildly elevated pulmonary artery systolic pressure. 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  Very mild cylindrical bronchiectasis in the lungs bilaterally with some mucus impaction right lower lobe.  Mediastinal adenopathy. 02/17/2020 PFT: Normal spirometry and lung volumes.  Review of Systems A 10 point review of systems was performed and it is as noted above otherwise  negative.  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   Urine WBC increased 01/05/2019   Anemia 10/15/2018   Port-A-Cath in place 09/29/2018   Diabetic retinopathy ([place]) 09/29/2018   Genetic testing 09/23/2018   Family history of leukemia 09/16/2018   Family history of breast cancer    Malignant neoplasm of upper-outer quadrant of left breast in female, estrogen receptor positive ([place]) 09/11/2018   Family history of breast cancer in sister 08/28/2018   Diverticular disease 10/17/2017   Primary osteoarthritis of right knee 03/07/2017   HSV-1 (herpes simplex virus 1) infection, 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   Vitamin D deficiency 10/11/2016   Family history of thyroid disorder 04/04/2015   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   Counseling regarding end of life decision making 10/04/2014   OA (osteoarthritis) of neck 08/02/2014   History of duodenal ulcer 08/02/2014   Seasonal allergies 08/02/2014   Hypertension associated with diabetes ([place]) 08/02/2014   Hyperlipidemia associated with type 2 diabetes mellitus ([place]) 74/94/4967   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   History of joint replacement 03/01/2014   Controlled type 2 diabetes with  retinopathy ([place]) 11/07/2009   Social History   Tobacco Use   Smoking status: Never   Smokeless tobacco: Never  Substance Use Topics   Alcohol use: Not Currently    Alcohol/week: 0.0 standard drinks    Comment: rarely   Current Meds  Medication Sig   alendronate (FOSAMAX) 70  MG tablet Take 1 tablet (70 mg total) by mouth once a week. Take with a full glass of water on an empty stomach.   anastrozole (ARIMIDEX) 1 MG tablet TAKE 1 TABLET(1 MG) BY MOUTH DAILY   ARNUITY ELLIPTA 100 MCG/ACT AEPB INHALE 1 PUFF INTO THE LUNGS DAILY   aspirin 81 MG EC tablet Take 81 mg by mouth daily. Swallow whole.   atorvastatin (LIPITOR) 20 MG tablet Take 1 tablet (20 mg total) by mouth daily.   Calcium Carbonate-Vit D-Min (CALTRATE 600+D PLUS MINERALS) 600-800 MG-UNIT TABS Take 1 tablet by mouth in the morning and at bedtime.   clobetasol (TEMOVATE) 0.05 % external solution Apply 1 application topically 2 (two) times daily as needed (scalp). Reported on 08/16/2015   Dulaglutide (TRULICITY) 9.73 ZH/2.9JM SOPN Inject 0.75 mg into the skin once a week.   fenofibrate (TRICOR) 145 MG tablet Take 1 tablet (145 mg total) by mouth daily.   fluticasone (FLONASE) 50 MCG/ACT nasal spray SHAKE LIQUID AND USE 2 SPRAYS IN EACH NOSTRIL EVERY DAY   gabapentin (NEURONTIN) 300 MG capsule TAKE 1 CAPSULE(300 MG) BY MOUTH AT BEDTIME   glucose blood (FREESTYLE LITE) test strip USE TO CHECK BLOOD SUGAR DAILY   loratadine (CLARITIN) 10 MG tablet Take 10 mg by mouth daily.   losartan-hydrochlorothiazide (HYZAAR) 100-25 MG tablet TAKE 1 TABLET BY MOUTH DAILY   Multiple Vitamins-Minerals (CENTRUM SILVER 50+WOMEN) TABS Take 1 tablet by mouth daily.   Polyethyl Glycol-Propyl Glycol (SYSTANE OP) Apply to eye.   valACYclovir (VALTREX) 500 MG tablet Take 1 tablet (500 mg total) by mouth 2 (two) times daily.   Immunization History  Administered Date(s) Administered   Fluad Quad(high Dose 65+) 04/30/2019, 05/12/2020   Hep A / Hep B 05/29/2009, 07/03/2009, 11/24/2009   Influenza, High Dose Seasonal PF 04/18/2014, 04/13/2015, 04/30/2018   Influenza, Seasonal, Injecte, Preservative Fre 06/26/2016   Influenza-Unspecified 05/17/2009, 04/02/2011, 05/20/2012, 06/02/2013, 07/01/2013, 04/18/2014, 04/13/2015, 06/27/2016,  04/11/2017   PFIZER Comirnaty(Gray Top)Covid-19 Tri-Sucrose Vaccine 11/29/2020   PFIZER(Purple Top)SARS-COV-2 Vaccination 10/06/2019, 10/27/2019, 04/24/2020   Pneumococcal Conjugate-13 04/18/2014   Pneumococcal Polysaccharide-23 10/06/2015   Td 05/17/2009, 05/12/2020   Typhoid Inactivated 05/17/2009   Yellow Fever 05/17/2009   Zoster, Live 04/02/2011      Objective:   Physical Exam BP 128/74 (BP Location: Left Arm, Cuff Size: Normal)   Pulse 69   Temp 97.8 F (36.6 C) (Temporal)   Ht _0  (1.575 m)   Wt 170 lb 9.6 oz (77.4 kg)   LMP 08/26/1992 (Approximate)   SpO2 97%   BMI 31.20 kg/m  GENERAL: Awake, alert, fully ambulatory, no acute distress.  No conversational dyspnea HEAD: Normocephalic, atraumatic. EYES: Pupils equal, round, reactive to light.  No scleral icterus. MOUTH: Nose/mouth/throat not examined due to masking requirements for COVID 19. NECK: Supple. No thyromegaly. Trachea midline. No JVD.  No adenopathy. PULMONARY: Lungs clear to auscultation bilaterally. CARDIOVASCULAR: S1 and S2. Regular rate and rhythm.  No rubs murmurs or gallops heard. GASTROINTESTINAL: Benign. MUSCULOSKELETAL: No joint deformity, no clubbing, no edema. NEUROLOGIC: Awake, alert, no focal deficits.  Speech is fluent.  No gait disturbance noted, fully ambulatory. SKIN: Intact,warm,dry. PSYCH: Mood and behavior  appropriate    Assessment & Plan:     ICD-10-CM   1. Cough  R05.9    Well-controlled with Arnuity Likely related to anastrozole Possible airways reactivity as well    2. Pulmonary hypertension (HCC) - mild  I27.20    Very mild Does have obstructive sleep apnea Should have follow-up 2D echo in 6 months    3. OSA (obstructive sleep apnea)  G47.33    Continue CPAP Has been compliant    4. Bronchiectasis without complication ([place])  P32.9    Related to prior radiation Focal issue     Patient is doing well.  Continue same management.  We will see her in follow-up in 6 months  time she is to contact us prior to that time should any new difficulties arise.  Renold Don, MD [facility]   *This note was dictated using voice recognition software/Dragon.  Despite best efforts to proofread, errors can occur which can change the meaning.  Any change was purely unintentional.

## 2021-03-21 ENCOUNTER — Telehealth: Payer: Self-pay | Admitting: Family Medicine

## 2021-03-21 DIAGNOSIS — R7989 Other specified abnormal findings of blood chemistry: Secondary | ICD-10-CM

## 2021-03-21 NOTE — Telephone Encounter (Signed)
-----   Message from Ellamae Sia sent at 03/05/2021  9:26 AM EDT ----- Regarding: Lab orders for Thursday, 7.28.22 Lab orders, thanks

## 2021-03-22 ENCOUNTER — Other Ambulatory Visit (INDEPENDENT_AMBULATORY_CARE_PROVIDER_SITE_OTHER): Payer: Medicare Other

## 2021-03-22 ENCOUNTER — Other Ambulatory Visit: Payer: Self-pay

## 2021-03-22 DIAGNOSIS — R7989 Other specified abnormal findings of blood chemistry: Secondary | ICD-10-CM

## 2021-03-22 LAB — IBC + FERRITIN
Ferritin: 378 ng/mL — ABNORMAL HIGH (ref 10.0–291.0)
Iron: 88 ug/dL (ref 42–145)
Saturation Ratios: 17.7 % — ABNORMAL LOW (ref 20.0–50.0)
Transferrin: 355 mg/dL (ref 212.0–360.0)

## 2021-03-22 NOTE — Progress Notes (Signed)
No critical labs need to be addressed urgently. We will discuss labs in detail at upcoming office visit.   

## 2021-03-23 ENCOUNTER — Ambulatory Visit (INDEPENDENT_AMBULATORY_CARE_PROVIDER_SITE_OTHER): Payer: Medicare Other | Admitting: Family Medicine

## 2021-03-23 ENCOUNTER — Encounter: Payer: Self-pay | Admitting: Family Medicine

## 2021-03-23 VITALS — BP 120/60 | HR 82 | Temp 98.2°F | Ht 62.0 in | Wt 170.0 lb

## 2021-03-23 DIAGNOSIS — R1011 Right upper quadrant pain: Secondary | ICD-10-CM

## 2021-03-23 DIAGNOSIS — I2510 Atherosclerotic heart disease of native coronary artery without angina pectoris: Secondary | ICD-10-CM | POA: Diagnosis not present

## 2021-03-23 DIAGNOSIS — R944 Abnormal results of kidney function studies: Secondary | ICD-10-CM | POA: Diagnosis not present

## 2021-03-23 DIAGNOSIS — R7989 Other specified abnormal findings of blood chemistry: Secondary | ICD-10-CM | POA: Diagnosis not present

## 2021-03-23 DIAGNOSIS — E113293 Type 2 diabetes mellitus with mild nonproliferative diabetic retinopathy without macular edema, bilateral: Secondary | ICD-10-CM | POA: Diagnosis not present

## 2021-03-23 DIAGNOSIS — D649 Anemia, unspecified: Secondary | ICD-10-CM | POA: Diagnosis not present

## 2021-03-23 NOTE — Progress Notes (Signed)
Patient ID: Katie [name], female    DOB: 13-Mar-1946, 75 y.o.   MRN: [id number]  This visit was conducted in person.  BP 120/60   Pulse 82   Temp 98.2 F (36.8 C) (Temporal)   Ht '5\' 2"'$  (1.575 m)   Wt 170 lb (77.1 kg)   LMP 08/26/1992 (Approximate)   SpO2 96%   BMI 31.09 kg/m    CC: Chief Complaint  Patient presents with   Follow-up    DM & RUQ Pain    Subjective:   HPI: JAQUESE [name] is a 75 y.o. female presenting on 03/23/2021 for Follow-up (DM & RUQ Pain)  Diabetes:   Recent control moderate.Lamonte Sakai been off trulcitiy for 2 week to see if abd pain better... now back on in last week. Lab Results  Component Value Date   HGBA1C 6.5 02/19/2021  Using medications without difficulties: Hypoglycemic episodes: Hyperglycemic episodes: Feet problems: Blood Sugars averaging: FBS 133-146, 2 hour postprandial 181 eye exam within last year: Wt Readings from Last 3 Encounters:  03/23/21 170 lb (77.1 kg)  03/05/21 170 lb 9.6 oz (77.4 kg)  02/23/21 171 lb 8 oz (77.8 kg)      Elevated ferritin: unclear cause.. improved some  ( 463 down to 378)since she  stopped iron containing multivitamin.  RUQ pain:  Off and on bloating and RUQ pain... intermittently for years. 02/19/2021 CMET normal LFTs   She reports that abd bloating... seems to be triggered by cruciferous foods. Stopping trulicity did not help.  11/2019 CT abd pelvis: No definite findings to suggest metastatic disease in the chest, abdomen or pelvis. Normal appearance of the stomach. No pathologic dilatation of small bowel or colon. Numerous colonic diverticulae are noted, without surrounding inflammatory changes to suggest an acute diverticulitis at this time. Normal appendix. Stable 1.0 x 0.9 cm intermediate attenuation (30 HU) lesion in segment 5 adjacent to the gallbladder fossa, similar to prior examination, likely to represent a small mildly proteinaceous cyst. No other suspicious hepatic lesions. No intra or  extrahepatic biliary ductal dilatation. Status post cholecystectomy. Pancreas: No pancreatic mass. No pancreatic ductal dilatation. No pancreatic or peripancreatic fluid collections or inflammatory changes.     Relevant past medical, surgical, family and social history reviewed and updated as indicated. Interim medical history since our last visit reviewed. Allergies and medications reviewed and updated. Outpatient Medications Prior to Visit  Medication Sig Dispense Refill   alendronate (FOSAMAX) 70 MG tablet Take 1 tablet (70 mg total) by mouth once a week. Take with a full glass of water on an empty stomach. 12 tablet 3   anastrozole (ARIMIDEX) 1 MG tablet TAKE 1 TABLET(1 MG) BY MOUTH DAILY 90 tablet 3   ARNUITY ELLIPTA 100 MCG/ACT AEPB INHALE 1 PUFF INTO THE LUNGS DAILY 30 each 6   aspirin 81 MG EC tablet Take 81 mg by mouth daily. Swallow whole.     atorvastatin (LIPITOR) 20 MG tablet Take 1 tablet (20 mg total) by mouth daily. 90 tablet 3   Calcium Carbonate-Vit D-Min (CALTRATE 600+D PLUS MINERALS) 600-800 MG-UNIT TABS Take 1 tablet by mouth in the morning and at bedtime.     clobetasol (TEMOVATE) 0.05 % external solution Apply 1 application topically 2 (two) times daily as needed (scalp). Reported on 08/16/2015     Dulaglutide (TRULICITY) A999333 0000000 SOPN Inject 0.75 mg into the skin once a week. 3 mL 5   fenofibrate (TRICOR) 145 MG tablet Take 1 tablet (145 mg total)  by mouth daily. 90 tablet 3   fluticasone (FLONASE) 50 MCG/ACT nasal spray SHAKE LIQUID AND USE 2 SPRAYS IN EACH NOSTRIL EVERY DAY 16 g 2   gabapentin (NEURONTIN) 300 MG capsule TAKE 1 CAPSULE(300 MG) BY MOUTH AT BEDTIME 90 capsule 3   glucose blood (FREESTYLE LITE) test strip USE TO CHECK BLOOD SUGAR DAILY 50 strip 11   loratadine (CLARITIN) 10 MG tablet Take 10 mg by mouth daily.     losartan-hydrochlorothiazide (HYZAAR) 100-25 MG tablet TAKE 1 TABLET BY MOUTH DAILY 90 tablet 3   Polyethyl Glycol-Propyl Glycol  (SYSTANE OP) Apply to eye.     valACYclovir (VALTREX) 500 MG tablet Take 1 tablet (500 mg total) by mouth 2 (two) times daily. 6 tablet 2   Multiple Vitamins-Minerals (CENTRUM SILVER 50+WOMEN) TABS Take 1 tablet by mouth daily. (Patient not taking: Reported on 03/23/2021)     No facility-administered medications prior to visit.     Per HPI unless specifically indicated in ROS section below Review of Systems  Constitutional:  Positive for fatigue. Negative for fever.       Fatigue improving  HENT:  Negative for congestion.   Eyes:  Negative for pain.  Respiratory:  Negative for cough and shortness of breath.   Cardiovascular:  Negative for chest pain, palpitations and leg swelling.  Gastrointestinal:  Positive for abdominal pain. Negative for constipation, diarrhea and nausea.  Genitourinary:  Negative for dysuria and vaginal bleeding.  Musculoskeletal:  Negative for back pain.  Neurological:  Negative for syncope, light-headedness and headaches.  Psychiatric/Behavioral:  Negative for dysphoric mood.   Objective:  BP 120/60   Pulse 82   Temp 98.2 F (36.8 C) (Temporal)   Ht '5\' 2"'$  (1.575 m)   Wt 170 lb (77.1 kg)   LMP 08/26/1992 (Approximate)   SpO2 96%   BMI 31.09 kg/m   Wt Readings from Last 3 Encounters:  03/23/21 170 lb (77.1 kg)  03/05/21 170 lb 9.6 oz (77.4 kg)  02/23/21 171 lb 8 oz (77.8 kg)      Physical Exam    Results for orders placed or performed in visit on 03/22/21  IBC + Ferritin  Result Value Ref Range   Iron 88 42 - 145 ug/dL   Transferrin 355.0 212.0 - 360.0 mg/dL   Saturation Ratios 17.7 (L) 20.0 - 50.0 %   Ferritin 378.0 (H) 10.0 - 291.0 ng/mL    This visit occurred during the SARS-CoV-2 public health emergency.  Safety protocols were in place, including screening questions prior to the visit, additional usage of staff PPE, and extensive cleaning of exam room while observing appropriate contact time as indicated for disinfecting solutions.   COVID 19  screen:  No recent travel or known exposure to COVID19 The patient denies respiratory symptoms of COVID 19 at this time. The importance of social distancing was discussed today.   Assessment and Plan    Problem List Items Addressed This Visit     Controlled type 2 diabetes with retinopathy ([place]) - Primary (Chronic)     Not at goal FBS.[name] but has been off Trulicity x 2 weeks of last month. Follow and recheck in 2 months with A1C.       Relevant Orders   Comprehensive metabolic panel   Hemoglobin A1c   Anemia    Normal B12, iron high not low.  Likely anemia of chronic disease. Follow  Over time.       Relevant Orders   CBC with Differential/Platelet  Decreased GFR    Re-eval at next lab check. Drinking more water.       Elevated ferritin    Likely due to iron containing multivitamins. Transferrin now normal and ferritin decreasing. Re-eval in 2 months.       Relevant Orders   IBC + Ferritin   RUQ pain     No change in pain or bloating when stopped GLP1RA. No gallbladder.  Doubt related to unchanging lesion in gallbladder fossa ( possible  Proteinaceous cyst)  Seems to be related to cruciferous foods.. possible IBS pain predominant given generalized bloating and gas... trial of FODMAP diet.   If not improving follow up with Dr. Vicente Males GI.       Orders Placed This Encounter  Procedures   Comprehensive metabolic panel    Standing Status:   Future    Standing Expiration Date:   06/21/2021   Hemoglobin A1c    Standing Status:   Future    Standing Expiration Date:   06/21/2021   IBC + Ferritin    Standing Status:   Future    Standing Expiration Date:   03/23/2022   CBC with Differential/Platelet    Standing Status:   Future    Standing Expiration Date:   01/20/2022     Eliezer Lofts, MD

## 2021-03-23 NOTE — Assessment & Plan Note (Signed)
Likely due to iron containing multivitamins. Transferrin now normal and ferritin decreasing. Re-eval in 2 months.

## 2021-03-23 NOTE — Assessment & Plan Note (Signed)
No change in pain or bloating when stopped GLP1RA. No gallbladder.  Doubt related to unchanging lesion in gallbladder fossa ( possible  Proteinaceous cyst)  Seems to be related to cruciferous foods.. possible IBS pain predominant given generalized bloating and gas... trial of FODMAP diet.   If not improving follow up with Dr. Vicente Males GI.

## 2021-03-23 NOTE — Patient Instructions (Addendum)
Work on [employer].  If abdominal pain worsening/ returning.. follow up with Dr. Vicente Males, GI.  Continue Trulicity at current dose.  Stay off iron containing mulitvitamin.

## 2021-03-23 NOTE — Assessment & Plan Note (Signed)
Not at goal FBS.[name] but has been off Trulicity x 2 weeks of last month. Follow and recheck in 2 months with A1C.

## 2021-03-23 NOTE — Assessment & Plan Note (Signed)
Re-eval at next lab check. Drinking more water.

## 2021-03-23 NOTE — Assessment & Plan Note (Signed)
Normal B12, iron high not low.  Likely anemia of chronic disease. Follow  Over time.

## 2021-04-07 ENCOUNTER — Other Ambulatory Visit: Payer: Self-pay | Admitting: Family Medicine

## 2021-04-23 NOTE — Assessment & Plan Note (Signed)
wbc count again low and hemoglobin decreased in last year from 11.9 to 10.7 She is on Arimidex which can cause leukopenia and anemia.

## 2021-04-27 DIAGNOSIS — M79645 Pain in left finger(s): Secondary | ICD-10-CM | POA: Diagnosis not present

## 2021-04-27 DIAGNOSIS — M65312 Trigger thumb, left thumb: Secondary | ICD-10-CM | POA: Diagnosis not present

## 2021-04-27 DIAGNOSIS — M65352 Trigger finger, left little finger: Secondary | ICD-10-CM | POA: Diagnosis not present

## 2021-05-02 DIAGNOSIS — Z20822 Contact with and (suspected) exposure to covid-19: Secondary | ICD-10-CM | POA: Diagnosis not present

## 2021-05-07 ENCOUNTER — Telehealth: Payer: Self-pay | Admitting: Family Medicine

## 2021-05-07 NOTE — Telephone Encounter (Signed)
LVM for pt to rtn my call to schedule AWV with [facility].  

## 2021-05-13 ENCOUNTER — Other Ambulatory Visit: Payer: Self-pay | Admitting: Hematology and Oncology

## 2021-05-16 ENCOUNTER — Other Ambulatory Visit: Payer: Self-pay | Admitting: Family Medicine

## 2021-05-17 ENCOUNTER — Other Ambulatory Visit: Payer: Medicare Other

## 2021-05-17 ENCOUNTER — Ambulatory Visit: Payer: Medicare Other

## 2021-05-24 ENCOUNTER — Other Ambulatory Visit: Payer: Self-pay

## 2021-05-24 ENCOUNTER — Other Ambulatory Visit (INDEPENDENT_AMBULATORY_CARE_PROVIDER_SITE_OTHER): Payer: Medicare Other

## 2021-05-24 ENCOUNTER — Ambulatory Visit (INDEPENDENT_AMBULATORY_CARE_PROVIDER_SITE_OTHER): Payer: Medicare Other | Admitting: Family Medicine

## 2021-05-24 VITALS — BP 150/62 | HR 72 | Temp 97.4°F | Ht 62.0 in | Wt 170.5 lb

## 2021-05-24 DIAGNOSIS — E1159 Type 2 diabetes mellitus with other circulatory complications: Secondary | ICD-10-CM

## 2021-05-24 DIAGNOSIS — I700 Atherosclerosis of aorta: Secondary | ICD-10-CM

## 2021-05-24 DIAGNOSIS — E7800 Pure hypercholesterolemia, unspecified: Secondary | ICD-10-CM

## 2021-05-24 DIAGNOSIS — Z170 Estrogen receptor positive status [ER+]: Secondary | ICD-10-CM

## 2021-05-24 DIAGNOSIS — C50412 Malignant neoplasm of upper-outer quadrant of left female breast: Secondary | ICD-10-CM | POA: Diagnosis not present

## 2021-05-24 DIAGNOSIS — E1169 Type 2 diabetes mellitus with other specified complication: Secondary | ICD-10-CM

## 2021-05-24 DIAGNOSIS — E113293 Type 2 diabetes mellitus with mild nonproliferative diabetic retinopathy without macular edema, bilateral: Secondary | ICD-10-CM | POA: Diagnosis not present

## 2021-05-24 DIAGNOSIS — E785 Hyperlipidemia, unspecified: Secondary | ICD-10-CM

## 2021-05-24 DIAGNOSIS — I720 Aneurysm of carotid artery: Secondary | ICD-10-CM | POA: Diagnosis not present

## 2021-05-24 DIAGNOSIS — R7989 Other specified abnormal findings of blood chemistry: Secondary | ICD-10-CM | POA: Diagnosis not present

## 2021-05-24 DIAGNOSIS — I2510 Atherosclerotic heart disease of native coronary artery without angina pectoris: Secondary | ICD-10-CM | POA: Diagnosis not present

## 2021-05-24 DIAGNOSIS — I152 Hypertension secondary to endocrine disorders: Secondary | ICD-10-CM

## 2021-05-24 DIAGNOSIS — D649 Anemia, unspecified: Secondary | ICD-10-CM | POA: Diagnosis not present

## 2021-05-24 DIAGNOSIS — I10 Essential (primary) hypertension: Secondary | ICD-10-CM | POA: Diagnosis not present

## 2021-05-24 DIAGNOSIS — Z0000 Encounter for general adult medical examination without abnormal findings: Secondary | ICD-10-CM

## 2021-05-24 LAB — CBC WITH DIFFERENTIAL/PLATELET
Basophils Absolute: 0 10*3/uL (ref 0.0–0.1)
Basophils Relative: 0.6 % (ref 0.0–3.0)
Eosinophils Absolute: 0.1 10*3/uL (ref 0.0–0.7)
Eosinophils Relative: 1.4 % (ref 0.0–5.0)
HCT: 34 % — ABNORMAL LOW (ref 36.0–46.0)
Hemoglobin: 11.4 g/dL — ABNORMAL LOW (ref 12.0–15.0)
Lymphocytes Relative: 17 % (ref 12.0–46.0)
Lymphs Abs: 0.7 10*3/uL (ref 0.7–4.0)
MCHC: 33.5 g/dL (ref 30.0–36.0)
MCV: 102.4 fl — ABNORMAL HIGH (ref 78.0–100.0)
Monocytes Absolute: 0.4 10*3/uL (ref 0.1–1.0)
Monocytes Relative: 8.8 % (ref 3.0–12.0)
Neutro Abs: 3.1 10*3/uL (ref 1.4–7.7)
Neutrophils Relative %: 72.2 % (ref 43.0–77.0)
Platelets: 256 10*3/uL (ref 150.0–400.0)
RBC: 3.32 Mil/uL — ABNORMAL LOW (ref 3.87–5.11)
RDW: 14.4 % (ref 11.5–15.5)
WBC: 4.3 10*3/uL (ref 4.0–10.5)

## 2021-05-24 LAB — COMPREHENSIVE METABOLIC PANEL
ALT: 23 U/L (ref 0–35)
AST: 21 U/L (ref 0–37)
Albumin: 4.4 g/dL (ref 3.5–5.2)
Alkaline Phosphatase: 36 U/L — ABNORMAL LOW (ref 39–117)
BUN: 24 mg/dL — ABNORMAL HIGH (ref 6–23)
CO2: 26 mEq/L (ref 19–32)
Calcium: 10.5 mg/dL (ref 8.4–10.5)
Chloride: 102 mEq/L (ref 96–112)
Creatinine, Ser: 1.1 mg/dL (ref 0.40–1.20)
GFR: 48.82 mL/min — ABNORMAL LOW (ref >60.00)
Glucose, Bld: 112 mg/dL — ABNORMAL HIGH (ref 70–99)
Potassium: 4.2 mEq/L (ref 3.5–5.1)
Sodium: 137 mEq/L (ref 135–145)
Total Bilirubin: 0.5 mg/dL (ref 0.2–1.2)
Total Protein: 7.1 g/dL (ref 6.0–8.3)

## 2021-05-24 LAB — IBC + FERRITIN
Ferritin: 405.9 ng/mL — ABNORMAL HIGH (ref 10.0–291.0)
Iron: 99 ug/dL (ref 42–145)
Saturation Ratios: 18.1 % — ABNORMAL LOW (ref 20.0–50.0)
TIBC: 547.4 ug/dL — ABNORMAL HIGH (ref 250.0–450.0)
Transferrin: 391 mg/dL — ABNORMAL HIGH (ref 212.0–360.0)

## 2021-05-24 LAB — HEMOGLOBIN A1C: Hgb A1c MFr Bld: 6.9 % — ABNORMAL HIGH (ref 4.6–6.5)

## 2021-05-24 NOTE — Assessment & Plan Note (Signed)
Stable, chronic.  Continue current medication.   Losartan , HCTZ

## 2021-05-24 NOTE — Patient Instructions (Addendum)
Please stop at the lab to have labs drawn.  Follow BP at home call if > 140/90 consistently.

## 2021-05-24 NOTE — Assessment & Plan Note (Addendum)
Followed by  Neurosurgery.

## 2021-05-24 NOTE — Assessment & Plan Note (Signed)
Followed by opthalmology.  Associated with diabetes.

## 2021-05-24 NOTE — Assessment & Plan Note (Signed)
Stable, chronic.  Continue current medication. Will re-eval again today. Good  control on current regimen Trulicity 7.86 mg weekly.[name] if further weight loss desired we will consdier increasing Trulicity.

## 2021-05-24 NOTE — Assessment & Plan Note (Signed)
On statin.

## 2021-05-24 NOTE — Assessment & Plan Note (Signed)
Due for re-eval on atorvastatin 20 mg daily.

## 2021-05-24 NOTE — Addendum Note (Signed)
Addended by: Tammi Sou on: 05/24/2021 03:38 PM   Modules accepted: Orders

## 2021-05-24 NOTE — Assessment & Plan Note (Signed)
Active, on Arimidex. Followed by oncology.

## 2021-05-24 NOTE — Progress Notes (Signed)
Patient ID: Katie [name], female    DOB: 12-05-1945, 75 y.o.   MRN: [id number]  This visit was conducted in person.  BP (!) 150/62   Pulse 72   Temp (!) 97.4 F (36.3 C) (Temporal)   Ht 5\' 2"  (1.575 m)   Wt 170 lb 8 oz (77.3 kg)   LMP 08/26/1992 (Approximate)   SpO2 96%   BMI 31.18 kg/m    CC:  Chief Complaint  Patient presents with   Medicare Wellness    Subjective:   HPI: Katie [name] is a 75 y.o. female presenting on 05/24/2021 for Medicare Wellness  The patient presents for annual medicare wellness, complete physical and review of chronic health problems. He/She also has the following acute concerns today: Recovvering from COVID on cruise > 10 days ago. No SOB, mild cough.  I have personally reviewed the Medicare Annual Wellness questionnaire and have noted 1. The patient's medical and social history 2. Their use of alcohol, tobacco or illicit drugs 3. Their current medications and supplements 4. The patient's functional ability including ADL's, fall risks, home safety risks and hearing or visual             impairment. 5. Diet and physical activities 6. Evidence for depression or mood disorders 7.         Updated provider list Cognitive evaluation was performed and recorded on pt medicare questionnaire form. The patients weight, height, BMI and visual acuity have been recorded in the chart   I have made referrals, counseling and provided education to the patient based review of the above and I have provided the pt with a written personalized care plan for preventive services.   Documentation of this information was scanned into the electronic record under the media tab.   Advance directives and end of life planning reviewed in detail with patient and documented in EMR. Patient given handout on advance care directives if needed. HCPOA and living will updated if needed.  Hearing Screening   250Hz  500Hz  1000Hz  2000Hz  4000Hz   Right ear 20 20 20 20 20   Left ear  20 20 20 20 20   Vision Screening - Comments:: Last eye exam March 2022 at [facility] from 05/02/2020 in [place] at [place]  PHQ-2 Total Score 0      Fall Risk  05/24/2021 08/11/2020 05/02/2020 01/06/2019 10/13/2017  Falls in the past year? 0 0 0 0 No  Number falls in past yr: 0 - 0 - -  Injury with Fall? - - 0 - -  Risk for fall due to : - - Medication side effect - -  Follow up - Falls evaluation completed Falls evaluation completed;Falls prevention discussed - -   Hypertension:   Elevated in office today on losartan, HCTZ  BP Readings from Last 3 Encounters:  05/24/21 (!) 150/62  03/23/21 120/60  03/05/21 128/74  Using medication without problems or lightheadedness:  none Chest pain with exertion: none Edema:none Short of breath:none Average home BPs: 139/80 at home Other issues:     Elevated Cholesterol:  At goal on atorvastatin, now on 20 mg Lab Results  Component Value Date   CHOL 135 02/19/2021   HDL 43.80 02/19/2021   LDLCALC 58 02/19/2021   LDLDIRECT 57.0 05/02/2020   TRIG 165.0 (H) 02/19/2021   CHOLHDL 3 02/19/2021  Using medications without problems: none Muscle aches:  none Diet compliance: moderate Exercise:  walking 3-4 days a week  Other complaints:  Diabetes:   Good  control on current regimen Trulicity 6.01 mg weekly Lab Results  Component Value Date   HGBA1C 6.5 02/19/2021   Wt Readings from Last 3 Encounters:  05/24/21 170 lb 8 oz (77.3 kg)  03/23/21 170 lb (77.1 kg)  03/05/21 170 lb 9.6 oz (77.4 kg)   Weight loss has plateaued. Body mass index is 31.18 kg/m. Using medications without difficulties: Hypoglycemic episodes: Hyperglycemic episodes: Feet problems: no ulcers Blood Sugars averaging: FBS 129-145 eye exam within last year:   Relevant past medical, surgical, family and social history reviewed and updated as indicated. Interim medical history since our last visit reviewed. Allergies and  medications reviewed and updated. Outpatient Medications Prior to Visit  Medication Sig Dispense Refill   alendronate (FOSAMAX) 70 MG tablet Take 1 tablet (70 mg total) by mouth once a week. Take with a full glass of water on an empty stomach. 12 tablet 3   anastrozole (ARIMIDEX) 1 MG tablet TAKE 1 TABLET(1 MG) BY MOUTH DAILY 90 tablet 3   ARNUITY ELLIPTA 100 MCG/ACT AEPB INHALE 1 PUFF INTO THE LUNGS DAILY 30 each 6   aspirin 81 MG EC tablet Take 81 mg by mouth daily. Swallow whole.     atorvastatin (LIPITOR) 20 MG tablet Take 1 tablet (20 mg total) by mouth daily. 90 tablet 3   Calcium Carbonate-Vit D-Min (CALTRATE 600+D PLUS MINERALS) 600-800 MG-UNIT TABS Take 1 tablet by mouth in the morning and at bedtime.     clobetasol (TEMOVATE) 0.05 % external solution Apply 1 application topically 2 (two) times daily as needed (scalp). Reported on 08/16/2015     Dulaglutide (TRULICITY) 0.93 AT/5.5DD SOPN Inject 0.75 mg into the skin once a week. 3 mL 5   fenofibrate (TRICOR) 145 MG tablet Take 1 tablet (145 mg total) by mouth daily. 90 tablet 3   fluticasone (FLONASE) 50 MCG/ACT nasal spray SHAKE LIQUID AND USE 2 SPRAYS IN EACH NOSTRIL EVERY DAY 16 g 2   gabapentin (NEURONTIN) 300 MG capsule TAKE 1 CAPSULE(300 MG) BY MOUTH AT BEDTIME 90 capsule 3   glucose blood (FREESTYLE TEST STRIPS) test strip USE TO CHECK BLOOD SUGAR DAILY 50 strip 11   loratadine (CLARITIN) 10 MG tablet Take 10 mg by mouth daily.     losartan-hydrochlorothiazide (HYZAAR) 100-25 MG tablet TAKE 1 TABLET BY MOUTH DAILY 90 tablet 3   Multiple Vitamins-Minerals (CENTRUM SILVER 50+WOMEN) TABS Take 1 tablet by mouth daily.     Polyethyl Glycol-Propyl Glycol (SYSTANE OP) Apply to eye.     valACYclovir (VALTREX) 500 MG tablet Take 1 tablet (500 mg total) by mouth 2 (two) times daily. 6 tablet 2   No facility-administered medications prior to visit.     Per HPI unless specifically indicated in ROS section below Review of Systems   Constitutional:  Negative for fatigue and fever.  HENT:  Negative for congestion.   Eyes:  Negative for pain.  Respiratory:  Negative for cough and shortness of breath.   Cardiovascular:  Negative for chest pain, palpitations and leg swelling.  Gastrointestinal:  Negative for abdominal pain.       Bloating  Genitourinary:  Negative for dysuria and vaginal bleeding.  Musculoskeletal:  Negative for back pain.  Neurological:  Negative for syncope, light-headedness and headaches.  Psychiatric/Behavioral:  Negative for dysphoric mood.   Objective:  BP (!) 150/62   Pulse 72   Temp (!) 97.4 F (36.3 C) (Temporal)   Ht 5\' 2"  (1.575 m)  Wt 170 lb 8 oz (77.3 kg)   LMP 08/26/1992 (Approximate)   SpO2 96%   BMI 31.18 kg/m   Wt Readings from Last 3 Encounters:  05/24/21 170 lb 8 oz (77.3 kg)  03/23/21 170 lb (77.1 kg)  03/05/21 170 lb 9.6 oz (77.4 kg)      Physical Exam Vitals and nursing note reviewed.  Constitutional:      General: She is not in acute distress.    Appearance: Normal appearance. She is well-developed. She is not ill-appearing or toxic-appearing.  HENT:     Head: Normocephalic.     Right Ear: Hearing, tympanic membrane, ear canal and external ear normal.     Left Ear: Hearing, tympanic membrane, ear canal and external ear normal.     Nose: Nose normal.  Eyes:     General: Lids are normal. Lids are everted, no foreign bodies appreciated.     Conjunctiva/sclera: Conjunctivae normal.     Pupils: Pupils are equal, round, and reactive to light.  Neck:     Thyroid: No thyroid mass or thyromegaly.     Vascular: No carotid bruit.     Trachea: Trachea normal.  Cardiovascular:     Rate and Rhythm: Normal rate and regular rhythm.     Heart sounds: Normal heart sounds, S1 normal and S2 normal. No murmur heard.   No gallop.  Pulmonary:     Effort: Pulmonary effort is normal. No respiratory distress.     Breath sounds: Normal breath sounds. No wheezing, rhonchi or rales.   Abdominal:     General: Bowel sounds are normal. There is no distension or abdominal bruit.     Palpations: Abdomen is soft. There is no fluid wave or mass.     Tenderness: There is no abdominal tenderness. There is no guarding or rebound.     Hernia: No hernia is present.  Musculoskeletal:     Cervical back: Normal range of motion and neck supple.  Lymphadenopathy:     Cervical: No cervical adenopathy.  Skin:    General: Skin is warm and dry.     Findings: No rash.  Neurological:     Mental Status: She is alert.     Cranial Nerves: No cranial nerve deficit.     Sensory: No sensory deficit.  Psychiatric:        Mood and Affect: Mood is not anxious or depressed.        Speech: Speech normal.        Behavior: Behavior normal. Behavior is cooperative.        Judgment: Judgment normal.      Results for orders placed or performed in visit on 03/22/21  IBC + Ferritin  Result Value Ref Range   Iron 88 42 - 145 ug/dL   Transferrin 355.0 212.0 - 360.0 mg/dL   Saturation Ratios 17.7 (L) 20.0 - 50.0 %   Ferritin 378.0 (H) 10.0 - 291.0 ng/mL    This visit occurred during the SARS-CoV-2 public health emergency.  Safety protocols were in place, including screening questions prior to the visit, additional usage of staff PPE, and extensive cleaning of exam room while observing appropriate contact time as indicated for disinfecting solutions.   COVID 19 screen:  No recent travel or known exposure to COVID19 The patient denies respiratory symptoms of COVID 19 at this time. The importance of social distancing was discussed today.   Assessment and Plan The patient's preventative maintenance and recommended screening tests for an annual  wellness exam were reviewed in full today. Brought up to date unless services declined.  Counselled on the importance of diet, exercise, and its role in overall health and mortality. The patient's FH and SH was reviewed, including their home life, tobacco  status, and drug and alcohol status.   Last PAP 09/2014 nml, neg HPV, no further indicated. DVE:  no family history of ovarian or uterine cancer  No further mammogram after bilateral mastectomy. 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  On fosamax 70 mg weekly, Also on Arimidex which can cause bone loss. Vaccines: uptodate flu,Td, uptodate with PNA and COVID series x 5, consider shingrix Colon: 10/2017 Dr. Vicente Males repeat 5 years. 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  Continue current medication. Will re-eval again today. Good  control on current regimen Trulicity 2.83 mg weekly.[name] if further weight loss desired we will consdier increasing Trulicity.         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  Continue current medication.   Losartan , HCTZ      Anemia   Aortic atherosclerosis ([place])    On statin.      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      Diabetic retinopathy ([place])    Followed by opthalmology.  Associated with diabetes.      Elevated ferritin   Hyperlipidemia associated with type 2 diabetes mellitus ([place])    Due for re-eval on atorvastatin 20 mg daily.       Malignant neoplasm of upper-outer quadrant of left breast in female, estrogen receptor positive (HCC)    Active, on Arimidex. Followed by oncology.      Other Visit Diagnoses     Medicare annual wellness visit, subsequent    -  Primary   Coronary artery disease involving native heart, unspecified vessel or lesion type, unspecified whether angina present       Essential hypertension       Pure hypercholesterolemia              Eliezer Lofts, MD

## 2021-05-25 ENCOUNTER — Telehealth: Payer: Self-pay | Admitting: Family Medicine

## 2021-05-25 DIAGNOSIS — R7989 Other specified abnormal findings of blood chemistry: Secondary | ICD-10-CM

## 2021-05-25 LAB — LIPID PANEL
Cholesterol: 181 mg/dL (ref 0–200)
HDL: 53.5 mg/dL (ref >39.00)
LDL Cholesterol: 95 mg/dL (ref 0–99)
NonHDL: 127.41
Total CHOL/HDL Ratio: 3
Triglycerides: 164 mg/dL — ABNORMAL HIGH (ref 0.0–149.0)
VLDL: 32.8 mg/dL (ref 0.0–40.0)

## 2021-05-25 MED ORDER — TRULICITY 1.5 MG/0.5ML ~~LOC~~ SOAJ: 1.5000 mg | SUBCUTANEOUS | 11 refills | Status: DC

## 2021-05-25 NOTE — Telephone Encounter (Signed)
-----   Message from [name], MD sent at 05/25/2021  7:16 AM EDT ----- Nguyen Butler, Arimidex is not known to cause elevated ferritin directly.  However it could cause inflammation which could subsequently lead to increased ferritin levels.  The fact that she has low iron saturation indicates that this is not an overload of iron.  It is most likely an acute phase reactant.  However, when in doubt, we do recommend holding anastrozole.  Rechecking labs in 1 month may give [place] the answer.  That is what I would recommend at this time. Usually, if it is drug-induced, labs may take 1 to 2 months to improve.  Her breast cancer risk would not significantly increase by holding it for a few months.  Please let me know once you recheck the labs if it made a difference. Thank you for reaching out Vinay  ----- Message ----- From: [name], MD Sent: 05/24/2021   5:26 PM EDT To: [name], MD  Dr. Lindi Adie, Ms Dunsworth is a shared patient  who is on Arimidex for breast cancer treatment. She new and worsening iron overload on her recent lab testing in the last 3 months. She has been avoiding high iron foods and is not on a supplement.  He liver tests look normal and cbc shows mild anemia with high MCV. What do you recommend? Can the Arimidex cause this?

## 2021-05-25 NOTE — Addendum Note (Signed)
Addended by: Eliezer Lofts E on: 05/25/2021 08:56 AM   Modules accepted: Orders

## 2021-05-25 NOTE — Telephone Encounter (Signed)
Discussed Dr. Geralyn Flash comments with patient in detail, questions answered.   Will have her hold Arimidex x 1 month. She will call front office to schedule labs visit in 1 month for cbc and iron panel.   Given A1C elevated and patient plataeued for weight loss. Will increase Trulicity to 1.5 mg weekly.  Plan recheck DM in 3 months.

## 2021-06-25 ENCOUNTER — Other Ambulatory Visit: Payer: Self-pay

## 2021-06-25 ENCOUNTER — Other Ambulatory Visit (INDEPENDENT_AMBULATORY_CARE_PROVIDER_SITE_OTHER): Payer: Medicare Other

## 2021-06-25 DIAGNOSIS — R7989 Other specified abnormal findings of blood chemistry: Secondary | ICD-10-CM

## 2021-06-25 LAB — IBC + FERRITIN
Ferritin: 427.9 ng/mL — ABNORMAL HIGH (ref 10.0–291.0)
Iron: 103 ug/dL (ref 42–145)
Saturation Ratios: 19.5 % — ABNORMAL LOW (ref 20.0–50.0)
TIBC: 527.8 ug/dL — ABNORMAL HIGH (ref 250.0–450.0)
Transferrin: 377 mg/dL — ABNORMAL HIGH (ref 212.0–360.0)

## 2021-06-25 LAB — CBC WITH DIFFERENTIAL/PLATELET
Basophils Absolute: 0 10*3/uL (ref 0.0–0.1)
Basophils Relative: 0.9 % (ref 0.0–3.0)
Eosinophils Absolute: 0.1 10*3/uL (ref 0.0–0.7)
Eosinophils Relative: 2.1 % (ref 0.0–5.0)
HCT: 32.7 % — ABNORMAL LOW (ref 36.0–46.0)
Hemoglobin: 11 g/dL — ABNORMAL LOW (ref 12.0–15.0)
Lymphocytes Relative: 23 % (ref 12.0–46.0)
Lymphs Abs: 0.7 10*3/uL (ref 0.7–4.0)
MCHC: 33.7 g/dL (ref 30.0–36.0)
MCV: 103 fl — ABNORMAL HIGH (ref 78.0–100.0)
Monocytes Absolute: 0.4 10*3/uL (ref 0.1–1.0)
Monocytes Relative: 12.1 % — ABNORMAL HIGH (ref 3.0–12.0)
Neutro Abs: 2 10*3/uL (ref 1.4–7.7)
Neutrophils Relative %: 61.9 % (ref 43.0–77.0)
Platelets: 237 10*3/uL (ref 150.0–400.0)
RBC: 3.18 Mil/uL — ABNORMAL LOW (ref 3.87–5.11)
RDW: 14.6 % (ref 11.5–15.5)
WBC: 3.2 10*3/uL — ABNORMAL LOW (ref 4.0–10.5)

## 2021-07-02 ENCOUNTER — Other Ambulatory Visit: Payer: Self-pay | Admitting: Family Medicine

## 2021-07-02 DIAGNOSIS — R1011 Right upper quadrant pain: Secondary | ICD-10-CM

## 2021-07-02 DIAGNOSIS — R7989 Other specified abnormal findings of blood chemistry: Secondary | ICD-10-CM

## 2021-07-10 ENCOUNTER — Ambulatory Visit
Admission: RE | Admit: 2021-07-10 | Discharge: 2021-07-10 | Disposition: A | Discharge status: Home / Self Care | Payer: Medicare Other | Source: Ambulatory Visit | Attending: Family Medicine | Admitting: Family Medicine

## 2021-07-10 ENCOUNTER — Other Ambulatory Visit: Payer: Self-pay

## 2021-07-10 DIAGNOSIS — R7989 Other specified abnormal findings of blood chemistry: Secondary | ICD-10-CM | POA: Diagnosis not present

## 2021-07-10 DIAGNOSIS — K760 Fatty (change of) liver, not elsewhere classified: Secondary | ICD-10-CM | POA: Diagnosis not present

## 2021-07-10 DIAGNOSIS — R1011 Right upper quadrant pain: Secondary | ICD-10-CM | POA: Insufficient documentation

## 2021-07-11 ENCOUNTER — Other Ambulatory Visit: Payer: Self-pay | Admitting: Family Medicine

## 2021-07-11 DIAGNOSIS — K760 Fatty (change of) liver, not elsewhere classified: Secondary | ICD-10-CM

## 2021-07-11 DIAGNOSIS — R7989 Other specified abnormal findings of blood chemistry: Secondary | ICD-10-CM

## 2021-07-11 DIAGNOSIS — R1011 Right upper quadrant pain: Secondary | ICD-10-CM

## 2021-07-16 ENCOUNTER — Telehealth: Payer: Self-pay | Admitting: Pulmonary Disease

## 2021-07-16 ENCOUNTER — Encounter: Payer: Self-pay | Admitting: Family Medicine

## 2021-07-16 DIAGNOSIS — I272 Pulmonary hypertension, unspecified: Secondary | ICD-10-CM

## 2021-07-16 NOTE — Telephone Encounter (Signed)
Patient was seen 03/05/21 and Dr. Patsey Berthold note states  You should probably have another echocardiogram in another 6 months or so.  I don't see that an order had been placed for an Echo

## 2021-07-16 NOTE — Telephone Encounter (Signed)
Echo ordered.

## 2021-07-17 NOTE — Telephone Encounter (Signed)
I have spoke with Mrs. Katie [name] and her Echo appt has been scheduled at [facility] on 08/21/21 @ 8:30am and she is aware of the appt and location

## 2021-07-23 ENCOUNTER — Encounter: Payer: Self-pay | Admitting: Family Medicine

## 2021-07-26 DIAGNOSIS — I671 Cerebral aneurysm, nonruptured: Secondary | ICD-10-CM | POA: Diagnosis not present

## 2021-07-26 DIAGNOSIS — Z8679 Personal history of other diseases of the circulatory system: Secondary | ICD-10-CM | POA: Diagnosis not present

## 2021-07-26 DIAGNOSIS — Z9889 Other specified postprocedural states: Secondary | ICD-10-CM | POA: Diagnosis not present

## 2021-07-31 ENCOUNTER — Ambulatory Visit: Payer: Medicare Other | Admitting: Hematology and Oncology

## 2021-08-01 ENCOUNTER — Telehealth: Payer: Self-pay | Admitting: Pulmonary Disease

## 2021-08-01 ENCOUNTER — Encounter: Payer: Self-pay | Admitting: Internal Medicine

## 2021-08-01 ENCOUNTER — Other Ambulatory Visit (INDEPENDENT_AMBULATORY_CARE_PROVIDER_SITE_OTHER): Payer: Medicare Other

## 2021-08-01 ENCOUNTER — Ambulatory Visit (INDEPENDENT_AMBULATORY_CARE_PROVIDER_SITE_OTHER): Payer: Medicare Other | Admitting: Internal Medicine

## 2021-08-01 VITALS — BP 120/68 | HR 70 | Ht 62.0 in | Wt 170.0 lb

## 2021-08-01 DIAGNOSIS — D539 Nutritional anemia, unspecified: Secondary | ICD-10-CM | POA: Diagnosis not present

## 2021-08-01 DIAGNOSIS — R7989 Other specified abnormal findings of blood chemistry: Secondary | ICD-10-CM

## 2021-08-01 DIAGNOSIS — G4733 Obstructive sleep apnea (adult) (pediatric): Secondary | ICD-10-CM

## 2021-08-01 DIAGNOSIS — K760 Fatty (change of) liver, not elsewhere classified: Secondary | ICD-10-CM

## 2021-08-01 DIAGNOSIS — R1013 Epigastric pain: Secondary | ICD-10-CM

## 2021-08-01 LAB — BUN: BUN: 23 mg/dL (ref 6–23)

## 2021-08-01 LAB — FOLATE: Folate: >23.4 ng/mL (ref >5.9)

## 2021-08-01 LAB — CREATININE, SERUM: Creatinine, Ser: 1.07 mg/dL (ref 0.40–1.20)

## 2021-08-01 NOTE — Telephone Encounter (Signed)
Spoke to patient, who is requesting order for cpap supplies. Order has been placed to [facility].  Patient is aware and voiced his understanding.  Nothing further needed.

## 2021-08-01 NOTE — Progress Notes (Signed)
Chief Complaint: RUQ ab pain  HPI : 75 year old with history of DM, GERD, CAD, breast cancer s/p surgery/chemo/radiation, asthma, PUD, CAD presents with RUQ ab pain  She has had RUQ ab pain off-and-on for a long time, but she feels like the pain has been deeper and more significant than it was previously. This worsened ab pain has been going on for several months. Last week she started having a pain in her epigastric area that is radiating to the right side. She was instructed to stop her Trulicity to see if this would help with her pain. She notes that the pain gets worse when she eats. Denies NSAIDs. She has occasional constipation, but she usually takes some Metamucil to help keep her constipation under control. Endorses some regurgitation. Denies chest burning. She has a bed that she can elevate to help with reflux issues. She is not on any medications for her reflux. Denies dysphagia, N&V, melena, hematochezia, weight loss. Sister had a hemangioma in her liver. Denies GI cancers. Her last colonoscopy was in 2019 with 2 small precancerous polyps. Her last EGD was in 2018 that some gastritis.  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, small, not treating-watching    Blood transfusion without reported diagnosis    Cancer ([place])    breast 08/2018   Coronary artery disease    Diverticulitis    DJD (degenerative joint disease)    DM type 2 (diabetes mellitus, type 2) ([place])    Family history of breast cancer    Fibrocystic breast disease    GERD (gastroesophageal reflux disease)    History of chicken pox    Hypercholesteremia    Hypertension    Lichen planopilaris    Urinary incontinence    Vertigo 12/2019   1 episode    Past Surgical History:  Procedure Laterality Date   BREAST IMPLANT REMOVAL Bilateral  03/08/2019   Procedure: Removal of Bilateral Breast Implants;  Surgeon: Rolm Bookbinder, MD;  Location: [place];  Service: General;  Laterality: Bilateral;   BREAST SURGERY  02/1975   Left Breast Biopsy-Fibrocystic Disease   BREAST SURGERY  10/1981   Bialteral Capsulectomy Breast Surgery   BREAST SURGERY  07/1998   Replace Bilateral Silicone Implants   BREAST SURGERY  01/1976   Bilater breast surgery to remove fibrocystic tissue and replace with silicone implants   CATARACT EXTRACTION W/PHACO Right 02/08/2020   Procedure: CATARACT EXTRACTION PHACO AND INTRAOCULAR LENS PLACEMENT ([place]) RIGHT DIABETIC;  Surgeon: Birder [name], MD;  Location: [place];  Service: Ophthalmology;  Laterality: Right;  3.55 0:33.7   CATARACT EXTRACTION W/PHACO Left 02/29/2020   Procedure: CATARACT EXTRACTION PHACO AND INTRAOCULAR LENS PLACEMENT ([facility]) LEFT DIABETIC 2.93  00:27.1;  Surgeon: Birder [name], MD;  Location: [place];  Service: Ophthalmology;  Laterality: Left;  Diabetic - oral meds   CHOLECYSTECTOMY N/A 07/08/2016   Procedure: LAPAROSCOPIC CHOLECYSTECTOMY;  Surgeon: [name], MD;  Location: [facility];  Service: General;  Laterality: N/A;   COLONOSCOPY WITH PROPOFOL N/A 11/07/2017   Procedure: COLONOSCOPY WITH PROPOFOL;  Surgeon: Jonathon Bellows, MD;  Location: [facility];  Service: Gastroenterology;  Laterality: N/A;   DILATION AND CURETTAGE OF UTERUS  01/1994   ESOPHAGOGASTRODUODENOSCOPY (EGD) WITH PROPOFOL  N/A 12/05/2016   Procedure: ESOPHAGOGASTRODUODENOSCOPY (EGD) WITH PROPOFOL;  Surgeon: Jonathon Bellows, MD;  Location: [facility];  Service: Endoscopy;  Laterality: N/A;   FINGER SURGERY  2001 & 2003   Trigger Finger   FOOT SURGERY  1980   To Relieve Pinched Nerve   FOOT SURGERY  07/2008   Left Foot-Plantar Fasciitis   JOINT REPLACEMENT  04/19/2013   Hip Replacement-Right   MASTECTOMY W/ SENTINEL NODE BIOPSY Bilateral 03/08/2019   Procedure: BILATERAL MASTECTOMIES WITH  LEFT AXILLARY SENTINEL LYMPH NODE BIOPSY AND BLUE DYE INJECTION;  Surgeon: Rolm Bookbinder, MD;  Location: [place];  Service: General;  Laterality: Bilateral;   PARTIAL HIP ARTHROPLASTY Right    PLACEMENT OF BREAST IMPLANTS  12/26/2004   Replace Failed Implants   REPLACEMENT TOTAL KNEE Left    2019-   RHINOPLASTY  03/1964   To Correct Deviated Septum   Family History  Problem Relation Age of Onset   Arthritis Mother    Hypertension Mother    Heart disease Father    Diabetes Father    Heart attack Father    Breast cancer Sister 86       d. 11   Hypertension Brother    Leukemia Brother    Coronary artery disease Brother    Heart attack Brother    Social History   Tobacco Use   Smoking status: Never   Smokeless tobacco: Never  Vaping Use   Vaping Use: Never used  Substance Use Topics   Alcohol use: Not Currently    Alcohol/week: 0.0 standard drinks    Comment: rarely   Drug use: No   Current Outpatient Medications  Medication Sig Dispense Refill   alendronate (FOSAMAX) 70 MG tablet Take 1 tablet (70 mg total) by mouth once a week. Take with a full glass of water on an empty stomach. 12 tablet 3   anastrozole (ARIMIDEX) 1 MG tablet TAKE 1 TABLET(1 MG) BY MOUTH DAILY 90 tablet 3   ARNUITY ELLIPTA 100 MCG/ACT AEPB INHALE 1 PUFF INTO THE LUNGS DAILY 30 each 6   aspirin 81 MG EC tablet Take 81 mg by mouth daily. Swallow whole.     atorvastatin (LIPITOR) 20 MG tablet Take 1 tablet (20 mg total) by mouth daily. 90 tablet 3   Calcium Carbonate-Vit D-Min (CALTRATE 600+D PLUS MINERALS) 600-800 MG-UNIT TABS Take 1 tablet by mouth in the morning and at bedtime.     clobetasol (TEMOVATE) 0.05 % external solution Apply 1 application topically 2 (two) times daily as needed (scalp). Reported on 08/16/2015     fenofibrate (TRICOR) 145 MG tablet Take 1 tablet (145 mg total) by mouth daily. 90 tablet 3   fluticasone (FLONASE) 50 MCG/ACT nasal spray SHAKE LIQUID AND USE 2 SPRAYS IN EACH  NOSTRIL EVERY DAY 16 g 2   gabapentin (NEURONTIN) 300 MG capsule TAKE 1 CAPSULE(300 MG) BY MOUTH AT BEDTIME 90 capsule 3   glucose blood (FREESTYLE TEST STRIPS) test strip USE TO CHECK BLOOD SUGAR DAILY 50 strip 11   loratadine (CLARITIN) 10 MG tablet Take 10 mg by mouth daily.     losartan-hydrochlorothiazide (HYZAAR) 100-25 MG tablet TAKE 1 TABLET BY MOUTH DAILY 90 tablet 3   Multiple Vitamins-Minerals (CENTRUM SILVER 50+WOMEN) TABS Take 1 tablet by mouth daily.     Polyethyl Glycol-Propyl Glycol (SYSTANE OP) Apply to eye.     Dulaglutide (TRULICITY) 1.5 XK/4.8JE SOPN Inject 1.5 mg into the skin once a week. (Patient not taking: Reported on 08/01/2021) 3  mL 11   No current facility-administered medications for this visit.   Allergies  Allergen Reactions   Erythromycin Anaphylaxis    REACTION: Rash, hives   Ciprofloxacin Rash   Daypro [Oxaprozin] Rash   Enalapril Maleate Nausea Only and Rash   Nsaids Nausea Only   Vioxx [Rofecoxib] Nausea Only     Review of Systems: All systems reviewed and negative except where noted in HPI.   Physical Exam: BP 120/68   Pulse 70   Ht 5\' 2"  (1.575 m)   Wt 170 lb (77.1 kg)   LMP 08/26/1992 (Approximate)   BMI 31.09 kg/m  Constitutional: Pleasant,well-developed, female in no acute distress. HEENT: Normocephalic and atraumatic. Conjunctivae are normal. No scleral icterus. Cardiovascular: Normal rate, regular rhythm.  Pulmonary/chest: Effort normal and breath sounds normal. No wheezing, rales or rhonchi. Abdominal: Soft, nondistended, tender in the RUQ and epigastric areas Extremities: No edema Neurological: Alert and oriented to person place and time. Skin: Skin is warm and dry. No rashes noted. Psychiatric: Normal mood and affect. Behavior is normal.  Labs 02/2021: Vit B12 nml  Labs 04/2021: CMP with nml LFTs.   Labs 05/2021: CBC with dec WBC of 3.2, mild anemia with Hb of 11, platelets 237. Ferritin 527, iron/TIBC ratio is 103/527  MRCP  06/08/19: IMPRESSION: 1. No CT findings to explain the patient's history of pain. 2. Status post cholecystectomy. No intra or extrahepatic biliary duct dilatation. No choledocholithiasis.  CT A/P w/contrast 12/08/19: IMPRESSION: 1. Status post bilateral modified radical mastectomy with probable seromas on the left chest wall in the mastectomy site and in the left axillary region, as above. No definite findings to suggest metastatic disease in the chest, abdomen or pelvis. 2. Postradiation changes in the subpleural aspect of the left upper lobe deep to the left breast. 3. Scattered areas of cylindrical bronchiectasis in the lungs bilaterally with some chronic mucoid impaction within the medial right lower lobe. 4. Aortic atherosclerosis, in addition to left main and 2 vessel coronary artery disease. Assessment for potential risk factor modification, dietary therapy or pharmacologic therapy may be warranted, if clinically indicated. 5. Colonic diverticulosis without evidence of acute diverticulitis at this time. 6. Additional incidental findings, as above.  US ABDOMEN LIMITED RUQ (LIVER/GB)  Result Date: 07/10/2021 CLINICAL DATA:  Right upper quadrant pain.  Prior cholecystectomy. EXAM: ULTRASOUND ABDOMEN LIMITED RIGHT UPPER QUADRANT COMPARISON:  None. FINDINGS: Gallbladder: The gallbladder is surgically absent. Common bile duct: Diameter: 1.9 mm Liver: No focal lesion identified. Diffusely increased echogenicity of the liver parenchyma is seen. Portal vein is patent on color Doppler imaging with normal direction of blood flow towards the liver. Other: The study is limited secondary to the patient's body habitus. IMPRESSION: 1. Evidence of prior cholecystectomy. 2. Hepatic steatosis without focal liver lesions. Electronically Signed   By: Virgina Norfolk M.D.   On: 07/10/2021 21:03    EGD 12/05/16: - Normal examined duodenum. - Normal esophagus. - Gastritis. Biopsied. - A single  gastric polyp. Resected and retrieved. Path: DIAGNOSIS:  A. STOMACH POLYP, FUNDUS; COLD BIOPSY:  - FEATURES CONSISTENT WITH A BENIGN XANTHOMA, SEE NOTE.  - NEGATIVE FOR H. PYLORI, DYSPLASIA AND MALIGNANCY.  B.  STOMACH, RANDOM; COLD BIOPSY:  - MINIMAL TO MILD CHRONIC GASTRITIS.  - NEGATIVE FOR H. PYLORI, DYSPLASIA AND MALIGNANCY.  Note: PAS and cytokeratin immunostains are negative with appropriate  controls.   Colonoscopy 11/07/17: - Diverticulosis in the sigmoid colon. - Two 4 to 6 mm polyps in the ascending colon, removed  with a cold snare. Resected and retrieved. - One 5 mm polyp in the sigmoid colon, removed with a cold snare. Resected and retrieved. - The examination was otherwise normal on direct and retroflexion views. Path: A. COLON POLYP X2, ASCENDING; COLD SNARE:  - TUBULAR ADENOMA (1).  - POLYPOID FRAGMENT OF MILDLY INFLAMED COLONIC MUCOSA (1).  - NEGATIVE FOR HIGH GRADE DYSPLASIA AND MALIGNANCY.  B.  COLON POLYP, SIGMOID; COLD SNARE:  - TUBULAR ADENOMA.  - NEGATIVE FOR HIGH GRADE DYSPLASIA AND MALIGNANCY.   ASSESSMENT AND PLAN: RUQ/epigastric ab pain Fatty liver disease Macrocytic anemia Elevated ferritin History of colon polyps Patient presents with longstanding abdominal pain that has recently worsened.  Unclear etiology of her ab pain at this time. Reviewed her CT imaging from 11/2019, which does show a reasonable amount of right-sided stool burden, which could potentially be contributing to her symptoms. Will plan to evaluate further by performing CT A/P w/contrast and performing EGD. Pending results of these studies, can decide on further therapeutic intervention. Patient was also noted recently to have elevated ferritin along with a mild macrocytic anemia. 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  - Check  folate, reticulocytes, acute hepatitis panel, ANA, IgG, ASMA, HFE. Patient has decreased reticulocyte index of 1.3. Folate is normal.  - CT A/P w/contrast - Plan for EGD [facility] - Patient is due for repeat colonoscopy in 2024 based upon recommendations from her previous GI physician  [name], MD  I spent 62 minutes of time, including in depth chart review, independent review of results as outlined above, communicating results with the patient directly, face-to-face time with the patient, coordinating care, ordering studies and medications as appropriate, and documentation.

## 2021-08-01 NOTE — Patient Instructions (Signed)
Your provider has requested that you go to the basement level for lab work before leaving today. Press "B" on the elevator. The lab is located at the first door on the left as you exit the elevator.  You have been scheduled for an endoscopy. Please follow written instructions given to you at your visit today. If you use inhalers (even only as needed), please bring them with you on the day of your procedure.  You have been scheduled for a CT scan of the abdomen and pelvis at [facility], 1st floor Radiology. You are scheduled on 08/09/21  at 4:30pm. You should arrive 15 minutes prior to your appointment time for registration.  Please pick up 2 bottles of contrast from [facility] at least 3 days prior to your scan. The solution may taste better if refrigerated, but do NOT add ice or any other liquid to this solution. Shake well before drinking.   Please follow the written instructions below on the day of your exam:   1) Do not eat anything after 12:30pm (4 hours prior to your test)   2) Drink 1 bottle of contrast @ 2:30pm (2 hours prior to your exam)  Remember to shake well before drinking and do NOT pour over ice.     Drink 1 bottle of contrast @ 3:30pm (1 hour prior to your exam)   You may take any medications as prescribed with a small amount of water, if necessary. If you take any of the following medications: METFORMIN, GLUCOPHAGE, GLUCOVANCE, AVANDAMET, RIOMET, FORTAMET, [place] MET, JANUMET, GLUMETZA or METAGLIP, you MAY be asked to HOLD this medication 48 hours AFTER the exam.   The purpose of you drinking the oral contrast is to aid in the visualization of your intestinal tract. The contrast solution may cause some diarrhea. Depending on your individual set of symptoms, you may also receive an intravenous injection of x-ray contrast/dye. Plan on being at [facility] for 45 minutes or longer, depending on the type of exam you are having performed.   If you have any questions  regarding your exam or if you need to reschedule, you may call [facility] at [phone] between the hours of 8:00 am and 5:00 pm, Monday-Friday.    Due to recent changes in healthcare laws, you may see the results of your imaging and laboratory studies on MyChart before your provider has had a chance to review them.  We understand that in some cases there may be results that are confusing or concerning to you. Not all laboratory results come back in the same time frame and the provider may be waiting for multiple results in order to interpret others.  Please give [place] 48 hours in order for your provider to thoroughly review all the results before contacting the office for clarification of your results.   The [place] [facility] providers would like to encourage you to use [facility] to communicate with providers for non-urgent requests or questions.  Due to long hold times on the telephone, sending your provider a message by [facility] may be a faster and more efficient way to get a response.  Please allow 48 business hours for a response.  Please remember that this is for non-urgent requests.

## 2021-08-06 ENCOUNTER — Telehealth: Payer: Self-pay | Admitting: Internal Medicine

## 2021-08-06 NOTE — Telephone Encounter (Signed)
Dr. Lorenso Courier is out of the office until 08/08/21.   HEALTHCARE LAWS AND MY CHART RESULTS:   Due to recent changes in healthcare laws, you may see results of your imaging and/or laboratory studies on MyChart before I have had a chance to review them.  I understand that in some cases there may be results that are confusing or concerning to you. Please understand that not all results are received at the same time and often I may need to interpret multiple results in order to provide you with the best plan of care or course of treatment. Therefore, I ask that you please allow me ample time to thoroughly review all your results before contacting my office for clarification.

## 2021-08-06 NOTE — Assessment & Plan Note (Signed)
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 Neoadjuvant chemotherapy with [place] Perjeta 6 cyclescompleted 5/6/2020followed by Herceptin maintenance for 1 yearcompleted 12/01/2019 2. Followed byBilmastectomieswith sentinel lymph node study 03/08/2019: Left mastectomy: Grade 2 IDC spanning 7.5 cm, margins negative, right mastectomy: Benign, ER 95%, PR 5%, HER-2 1+ negative pathology lost the lymph node excision 3. 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 Scattered areas of bronchiectasis in the lungs. Atherosclerosis and diverticulosis. No evidence of metastatic disease.  Breast cancer surveillance: 1.  Breast exam 08/07/2021: Benign 2. mammogram: No role of imaging studies since she had bilateral mastectomies.  Brain aneurysm: Following at [facility]. Return to clinic in 1 year for follow-up

## 2021-08-06 NOTE — Progress Notes (Signed)
Patient Care Team: [name], MD as PCP - General (Family Medicine) Kate Sable, MD as PCP - Cardiology (Cardiology) Birder [name], MD as Referring Physician (Ophthalmology) [name], MD as Consulting Physician (Dermatology) [name], MD as Consulting Physician (General Surgery) [name], MD as Consulting Physician (Dentistry) [name], MD (Inactive) as Consulting Physician (General Surgery) [name], MD as Consulting Physician (Hematology and Oncology) [name], MD as Attending Physician (Radiation Oncology) Rolm Bookbinder, MD as Consulting Physician (General Surgery)  DIAGNOSIS:    ICD-10-CM   1. Malignant neoplasm of upper-outer quadrant of left breast in female, estrogen receptor positive ([place])  C50.412    Z17.0       SUMMARY OF ONCOLOGIC HISTORY: Oncology History  Malignant neoplasm of upper-outer quadrant of left breast in female, estrogen receptor positive ([place])  09/14/2018 Initial Diagnosis   With prior history of breast implants that were ruptured/replaced, patient had cellulitis LIQ left breast for 1 week, and UOQ asymmetry 1.5 cm, MRI right breast biopsy benign, left breast numerous masses 7 cm skin thickening: 2 biopsies from left breast: Grade 2-3 IDC ER PR positive, HER-2 positive, Ki-67 50%   09/16/2018 Cancer Staging   Staging form: Breast, AJCC 8th Edition - Clinical stage from 09/16/2018: Stage IIIB (cT4, cN0, cM0, G3, ER+, PR+, HER2+)    09/23/2018 Genetic Testing   Genetic testing performed through [facility]'s Common Hereditary Cancers Panel reported out on 09/23/2018 showed no pathogenic mutations.    The Common Hereditary Cancers Panel offered by [facility] includes sequencing and/or deletion duplication testing of the following 47 genes: APC, ATM, AXIN2, BARD1, BMPR1A, BRCA1, BRCA2, BRIP1, CDH1, CDKN2A (p14ARF), CDKN2A (p16INK4a), CKD4, CHEK2, CTNNA1, DICER1, EPCAM (Deletion/duplication testing only),  GREM1 (promoter region deletion/duplication testing only), KIT, MEN1, MLH1, MSH2, MSH3, MSH6, MUTYH, NBN, NF1, NHTL1, PALB2, PDGFRA, PMS2, POLD1, POLE, PTEN, RAD50, RAD51C, RAD51D, SDHB, SDHC, SDHD, SMAD4, SMARCA4. STK11, TP53, TSC1, TSC2, and VHL.  The following genes were evaluated for sequence changes only: SDHA and HOXB13 c.251G>A variant only.   09/30/2018 - 07/06/2020 Chemotherapy   palonosetron (ALOXI) injection 0.25 mg, 0.25 mg, Intravenous,  Once, 6 of 6 cycles. Administration: 0.25 mg (09/30/2018), 0.25 mg (10/21/2018), 0.25 mg (12/23/2018), 0.25 mg (01/13/2019), 0.25 mg (11/11/2018), 0.25 mg (12/02/2018)  pegfilgrastim-cbqv (UDENYCA) injection 6 mg, 6 mg, Subcutaneous, Once, 5 of 5 cycles. Administration: 6 mg (10/02/2018), 6 mg (10/23/2018), 6 mg (11/13/2018), 6 mg (12/04/2018), 6 mg (12/25/2018)  trastuzumab (HERCEPTIN) 600 mg in sodium chloride 0.9 % 250 mL chemo infusion, 609 mg, Intravenous,  Once, 6 of 6 cycles. Administration: 600 mg (09/30/2018), 450 mg (10/21/2018), 450 mg (12/23/2018), 450 mg (01/13/2019), 450 mg (11/11/2018), 450 mg (12/02/2018)  CARBOplatin (PARAPLATIN) 510 mg in sodium chloride 0.9 % 250 mL chemo infusion, 510 mg (103.4 % of original dose 493.2 mg), Intravenous,  Once, 6 of 6 cycles. Dose modification:   (original dose 493.2 mg, Cycle 1). Administration: 510 mg (09/30/2018), 430 mg (10/21/2018), 420 mg (12/23/2018), 420 mg (01/13/2019), 430 mg (11/11/2018), 430 mg (12/02/2018)  DOCEtaxel (TAXOTERE) 140 mg in sodium chloride 0.9 % 250 mL chemo infusion, 75 mg/m2 = 140 mg, Intravenous,  Once, 5 of 5 cycles. Dose modification: 60 mg/m2 (original dose 75 mg/m2, Cycle 2, Reason: Dose not tolerated). Administration: 140 mg (09/30/2018), 110 mg (10/21/2018), 110 mg (12/23/2018), 110 mg (11/11/2018), 110 mg (12/02/2018)  pertuzumab (PERJETA) 420 mg in sodium chloride 0.9 % 250 mL chemo infusion, 420 mg (100 % of original dose 420 mg), Intravenous, Once,  1 of 1 cycle. Dose modification: 420 mg (original dose  420 mg, Cycle 1, Reason: Provider Judgment). Administration: 420 mg (09/30/2018)  fosaprepitant (EMEND) 150 mg  dexamethasone (DECADRON) 12 mg in sodium chloride 0.9 % 145 mL IVPB, , Intravenous,  Once, 6 of 6 cycles. Administration:  (09/30/2018),  (10/21/2018),  (12/23/2018),  (01/13/2019),  (11/11/2018),  (12/02/2018)  ado-trastuzumab emtansine (KADCYLA) 260 mg in sodium chloride 0.9 % 250 mL chemo infusion, 3.6 mg/kg = 260 mg, Intravenous, Once, 6 of 10 cycles. Dose modification: 3 mg/kg (original dose 3.6 mg/kg, Cycle 5, Reason: Dose not tolerated), 2.4 mg/kg (original dose 3.6 mg/kg, Cycle 6, Reason: Provider Judgment). Administration: 260 mg (03/24/2019), 260 mg (04/14/2019), 200 mg (06/16/2019), 260 mg (05/05/2019), 200 mg (05/26/2019), 160 mg (07/07/2019)   01/19/2019 Breast MRI   Significant interval improvement, the multiple left breast masses and areas of non mass enhancement in all 4 quadrants have decreased in size and extent. The mass previously measuring 1.4 cm with diffuse enhancement now measures 9 mm with heterogeneous areas of enhancement. Current greatest transverse dimension is 5.4 x 3.9 cm, previously 7 cm x 5cm. 2. 4 mm masslike focus of enhancement at the base of the right nipple is unchanged.   03/08/2019 Surgery   Bilateral mastectomies [name] [phone]): right breast: no malignancy; left breast: IDC, grade 2, scattered over 7.5cm, clear margins. No lymph nodes were examined.   03/08/2019 Cancer Staging   Staging form: Breast, AJCC 8th Edition - Pathologic stage from 03/08/2019: No Stage Recommended (ypT3, pNX, cM0, G2, ER+, PR+, HER2-)   05/04/2019 - 06/14/2019 Radiation Therapy   The patient initially received a dose of 50 Gy in 25 fractions to the breast using whole-breast tangent fields. This was delivered using a 3-D conformal technique. The patient also received 50 Gy in 25 fractions to the left supraclavicular region. The pt received a boost delivering an additional 10 Gy in 5  fractions using a electron boost with 34mV electrons. The total dose was 110 Gy.   05/2019 -  Anti-estrogen oral therapy   Anastrozole     CHIEF COMPLIANT: Follow-up of left breast cancer on anastrozole   INTERVAL HISTORY: BSHONTELL PROSSERis a 75y.o. with above-mentioned history of HER-2 positive left breast cancer treated with neoadjuvant chemotherapy, bilateral mastectomies, radiation, Herceptin maintenance, and is currently on antiestrogen therapy with anastrozole. She presents to the clinic today for follow-up.  She informed me that she has been having abdominal pain for the past 2 to 3 weeks and has seen gastroenterology.  She has a CT scan coming up on Thursday.  She has not had any other symptoms like weight loss or loss of appetite.  Ultrasound of the abdomen suggested fatty liver.  Blood work showed elevated ferritin with low iron saturation suggestive of inflammation.  ALLERGIES:  is allergic to erythromycin, ciprofloxacin, daypro [oxaprozin], enalapril maleate, nsaids, and vioxx [rofecoxib].  MEDICATIONS:  Current Outpatient Medications  Medication Sig Dispense Refill   alendronate (FOSAMAX) 70 MG tablet Take 1 tablet (70 mg total) by mouth once a week. Take with a full glass of water on an empty stomach. 12 tablet 3   anastrozole (ARIMIDEX) 1 MG tablet TAKE 1 TABLET(1 MG) BY MOUTH DAILY 90 tablet 3   ARNUITY ELLIPTA 100 MCG/ACT AEPB INHALE 1 PUFF INTO THE LUNGS DAILY 30 each 6   aspirin 81 MG EC tablet Take 81 mg by mouth daily. Swallow whole.     atorvastatin (LIPITOR) 20 MG tablet Take 1  tablet (20 mg total) by mouth daily. 90 tablet 3   Calcium Carbonate-Vit D-Min (CALTRATE 600+D PLUS MINERALS) 600-800 MG-UNIT TABS Take 1 tablet by mouth in the morning and at bedtime.     clobetasol (TEMOVATE) 0.05 % external solution Apply 1 application topically 2 (two) times daily as needed (scalp). Reported on 08/16/2015     Dulaglutide (TRULICITY) 1.5 OM/7.6HM SOPN Inject 1.5 mg into the  skin once a week. (Patient not taking: Reported on 08/01/2021) 3 mL 11   fenofibrate (TRICOR) 145 MG tablet Take 1 tablet (145 mg total) by mouth daily. 90 tablet 3   fluticasone (FLONASE) 50 MCG/ACT nasal spray SHAKE LIQUID AND USE 2 SPRAYS IN EACH NOSTRIL EVERY DAY 16 g 2   gabapentin (NEURONTIN) 300 MG capsule TAKE 1 CAPSULE(300 MG) BY MOUTH AT BEDTIME 90 capsule 3   glucose blood (FREESTYLE TEST STRIPS) test strip USE TO CHECK BLOOD SUGAR DAILY 50 strip 11   loratadine (CLARITIN) 10 MG tablet Take 10 mg by mouth daily.     losartan-hydrochlorothiazide (HYZAAR) 100-25 MG tablet TAKE 1 TABLET BY MOUTH DAILY 90 tablet 3   Multiple Vitamins-Minerals (CENTRUM SILVER 50+WOMEN) TABS Take 1 tablet by mouth daily.     Polyethyl Glycol-Propyl Glycol (SYSTANE OP) Apply to eye.     No current facility-administered medications for this visit.    PHYSICAL EXAMINATION: ECOG PERFORMANCE STATUS: 1 - Symptomatic but completely ambulatory  Vitals:   08/07/21 0800  BP: (!) 151/45  Pulse: 73  Resp: 18  Temp: 97.8 F (36.6 C)  SpO2: 99%   Filed Weights   08/07/21 0800  Weight: 170 lb 1.6 oz (77.2 kg)    BREAST: Bilateral mastectomies with scar tissue no palpable lumps or nodules.  (exam performed in the presence of a chaperone)  LABORATORY DATA:  I have reviewed the data as listed CMP Latest Ref Rng & Units 08/01/2021 05/24/2021 02/19/2021  Glucose 70 - 99 mg/dL - 112(H) 128(H)  BUN 6 - 23 mg/dL 23 24(H) 19  Creatinine 0.40 - 1.20 mg/dL 1.07 1.10 1.16  Sodium 135 - 145 mEq/L - 137 135  Potassium 3.5 - 5.1 mEq/L - 4.2 3.9  Chloride 96 - 112 mEq/L - 102 99  CO2 19 - 32 mEq/L - 26 26  Calcium 8.4 - 10.5 mg/dL - 10.5 10.9(H)  Total Protein 6.0 - 8.3 g/dL - 7.1 6.9  Total Bilirubin 0.2 - 1.2 mg/dL - 0.5 0.4  Alkaline Phos 39 - 117 U/L - 36(L) 30(L)  AST 0 - 37 U/L - 21 26  ALT 0 - 35 U/L - 23 25    Lab Results  Component Value Date   WBC 3.2 (L) 06/25/2021   HGB 11.0 (L) 06/25/2021   HCT  32.7 (L) 06/25/2021   MCV 103.0 (H) 06/25/2021   PLT 237.0 06/25/2021   NEUTROABS 2.0 06/25/2021    ASSESSMENT & PLAN:  Malignant neoplasm of upper-outer quadrant of left breast in female, estrogen receptor positive ([place]) With prior history of breast implants that were ruptured/replaced, patient had cellulitis LIQ left breast for 1 week, and UOQ asymmetry 1.5 cm, MRI right breast biopsy benign, left breast numerous masses 7 cm skin thickening: 2 biopsies from left breast: Grade 2-3 IDC ER PR positive, HER-2 positive, Ki-67 50% Clinical stage: T4 N0 M0 stage IIIb   Treatment summary: 1. Neoadjuvant chemotherapy with TCH Perjeta 6 cycles completed 12/30/2018 followed by Herceptin maintenance for 1 year completed 12/01/2019 2. Followed by Bil mastectomies with  sentinel lymph node study 03/08/2019: Left mastectomy: Grade 2 IDC spanning 7.5 cm, margins negative, right mastectomy: Benign, ER 95%, PR 5%, HER-2 1+ negative pathology lost the lymph node excision 3. Followed by adjuvant radiation therapy started 05/04/2019-06/14/2019 4.  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 Anastrozole toxicities: None   CT CAP 12/08/2019: Postsurgical and postradiation changes.  Scattered areas of bronchiectasis in the lungs.  Atherosclerosis and diverticulosis.  No evidence of metastatic disease.   Breast cancer surveillance: 1.  Breast exam 08/07/2021: Benign 2. mammogram: No role of imaging studies since she had bilateral mastectomies.   Brain aneurysm: Following at [facility]  Right upper quadrant abdominal pain: CT abdomen and pelvis being scheduled for Thursday. Elevated ferritin with low iron saturation: Acute phase  reactant.  I will call her on Friday to discuss results.  Return to clinic in 1 year for follow-up      No orders of the defined types were placed in this encounter.  The patient has a good understanding of the overall plan. she agrees with it. she will call with any problems that may develop before the next visit here.  Total time spent: 30 mins including face to face time and time spent for planning, charting and coordination of care  Rulon Eisenmenger, MD, MPH 08/07/2021  I, Thana Ates, am acting as scribe for Dr. [name].  I have reviewed the above documentation for accuracy and completeness, and I agree with the above.

## 2021-08-06 NOTE — Telephone Encounter (Signed)
Patient called stating she had questions regarding the results from her recent bloodwork.  Please call.  Thank you.

## 2021-08-07 ENCOUNTER — Other Ambulatory Visit: Payer: Self-pay

## 2021-08-07 ENCOUNTER — Inpatient Hospital Stay: Payer: Medicare Other | Attending: Hematology and Oncology | Admitting: Hematology and Oncology

## 2021-08-07 DIAGNOSIS — R7989 Other specified abnormal findings of blood chemistry: Secondary | ICD-10-CM | POA: Insufficient documentation

## 2021-08-07 DIAGNOSIS — Z79899 Other long term (current) drug therapy: Secondary | ICD-10-CM | POA: Insufficient documentation

## 2021-08-07 DIAGNOSIS — Z7984 Long term (current) use of oral hypoglycemic drugs: Secondary | ICD-10-CM | POA: Insufficient documentation

## 2021-08-07 DIAGNOSIS — Z923 Personal history of irradiation: Secondary | ICD-10-CM | POA: Insufficient documentation

## 2021-08-07 DIAGNOSIS — R1011 Right upper quadrant pain: Secondary | ICD-10-CM | POA: Insufficient documentation

## 2021-08-07 DIAGNOSIS — Z170 Estrogen receptor positive status [ER+]: Secondary | ICD-10-CM | POA: Insufficient documentation

## 2021-08-07 DIAGNOSIS — G620 Drug-induced polyneuropathy: Secondary | ICD-10-CM | POA: Insufficient documentation

## 2021-08-07 DIAGNOSIS — I671 Cerebral aneurysm, nonruptured: Secondary | ICD-10-CM | POA: Diagnosis not present

## 2021-08-07 DIAGNOSIS — Z79811 Long term (current) use of aromatase inhibitors: Secondary | ICD-10-CM | POA: Diagnosis not present

## 2021-08-07 DIAGNOSIS — C50412 Malignant neoplasm of upper-outer quadrant of left female breast: Secondary | ICD-10-CM | POA: Insufficient documentation

## 2021-08-07 DIAGNOSIS — T451X5D Adverse effect of antineoplastic and immunosuppressive drugs, subsequent encounter: Secondary | ICD-10-CM | POA: Diagnosis not present

## 2021-08-07 DIAGNOSIS — Z9013 Acquired absence of bilateral breasts and nipples: Secondary | ICD-10-CM | POA: Insufficient documentation

## 2021-08-08 NOTE — Telephone Encounter (Signed)
Called patient regarding her lab results. Reassured her that low titer ANA positivity is relatively common in the general population and not necessarily concerning for autoimmune disease. She also had a positive hepatitis A antibody, but this usually suggests prior infection or prior vaccination against hepatitis A. Patient states that she has been vaccinated against Hep A and Hep B in the past. Her Hep B surface antibody was not positive, but this just suggests that she may need a Hep B booster in the future. Patient was appreciative of the information.

## 2021-08-09 ENCOUNTER — Ambulatory Visit (HOSPITAL_COMMUNITY)
Admission: RE | Admit: 2021-08-09 | Discharge: 2021-08-09 | Disposition: A | Discharge status: Home / Self Care | Payer: Medicare Other | Source: Ambulatory Visit | Attending: Internal Medicine | Admitting: Internal Medicine

## 2021-08-09 ENCOUNTER — Other Ambulatory Visit: Payer: Self-pay

## 2021-08-09 DIAGNOSIS — R7989 Other specified abnormal findings of blood chemistry: Secondary | ICD-10-CM | POA: Insufficient documentation

## 2021-08-09 DIAGNOSIS — R1013 Epigastric pain: Secondary | ICD-10-CM | POA: Insufficient documentation

## 2021-08-09 DIAGNOSIS — K760 Fatty (change of) liver, not elsewhere classified: Secondary | ICD-10-CM | POA: Diagnosis not present

## 2021-08-09 DIAGNOSIS — D539 Nutritional anemia, unspecified: Secondary | ICD-10-CM | POA: Insufficient documentation

## 2021-08-09 DIAGNOSIS — R109 Unspecified abdominal pain: Secondary | ICD-10-CM | POA: Diagnosis not present

## 2021-08-09 LAB — RETICULOCYTES
ABS Retic: 79000 cells/uL (ref 20000–80000)
Retic Ct Pct: 2.5 %

## 2021-08-09 LAB — HEPATITIS B SURFACE ANTIBODY,QUALITATIVE: Hep B S Ab: NONREACTIVE

## 2021-08-09 LAB — HEPATITIS C ANTIBODY
Hepatitis C Ab: NONREACTIVE
SIGNAL TO CUT-OFF: 0.53 (ref <1.00)

## 2021-08-09 LAB — ANTI-NUCLEAR AB-TITER (ANA TITER): ANA Titer 1: 1:40 {titer} — ABNORMAL HIGH

## 2021-08-09 LAB — HEMOCHROMATOSIS DNA-PCR(C282Y,H63D)

## 2021-08-09 LAB — IGG: IgG (Immunoglobin G), Serum: 975 mg/dL (ref 600–1540)

## 2021-08-09 LAB — ANTI-SMOOTH MUSCLE ANTIBODY, IGG: Actin (Smooth Muscle) Antibody (IGG): <20 U (ref <20)

## 2021-08-09 LAB — HEPATITIS A ANTIBODY, TOTAL: Hepatitis A AB,Total: REACTIVE — AB

## 2021-08-09 LAB — HEPATITIS B SURFACE ANTIGEN: Hepatitis B Surface Ag: NONREACTIVE

## 2021-08-09 LAB — ANA: Anti Nuclear Antibody (ANA): POSITIVE — AB

## 2021-08-09 MED ORDER — IOHEXOL 350 MG/ML SOLN
80.0000 mL | Freq: Once | INTRAVENOUS | Status: AC | PRN
  Administered 2021-08-09: 80 mL via INTRAVENOUS

## 2021-08-10 ENCOUNTER — Encounter: Payer: Self-pay | Admitting: Internal Medicine

## 2021-08-10 ENCOUNTER — Telehealth: Payer: Self-pay | Admitting: Family Medicine

## 2021-08-10 DIAGNOSIS — E113293 Type 2 diabetes mellitus with mild nonproliferative diabetic retinopathy without macular edema, bilateral: Secondary | ICD-10-CM

## 2021-08-10 NOTE — Telephone Encounter (Signed)
-----   Message from Ellamae Sia sent at 08/07/2021  2:21 PM EST ----- Regarding: Lab orders for Wednesday, 12.28.22 Lab orders for a 3 month follow up appt.

## 2021-08-11 ENCOUNTER — Other Ambulatory Visit: Payer: Self-pay | Admitting: Hematology and Oncology

## 2021-08-12 ENCOUNTER — Encounter: Payer: Self-pay | Admitting: Family Medicine

## 2021-08-14 ENCOUNTER — Telehealth: Payer: Self-pay | Admitting: Hematology and Oncology

## 2021-08-14 ENCOUNTER — Encounter: Payer: Self-pay | Admitting: Internal Medicine

## 2021-08-14 ENCOUNTER — Telehealth: Payer: Self-pay

## 2021-08-14 NOTE — Telephone Encounter (Signed)
I discussed the CT scan with the patient and talked about the findings of fatty liver and benign cyst in the liver as well as colonic diverticulosis.  There is no concern for metastatic disease.

## 2021-08-14 NOTE — Telephone Encounter (Signed)
Pt called and LVM asking for CT results, stating MD was going to call her, but she thinks he was calling her home number. Pt requests MD calls pt's cell phone [phone]. Message provided for MD to advise.

## 2021-08-15 ENCOUNTER — Other Ambulatory Visit: Payer: Medicare Other

## 2021-08-15 DIAGNOSIS — L298 Other pruritus: Secondary | ICD-10-CM | POA: Diagnosis not present

## 2021-08-15 DIAGNOSIS — L538 Other specified erythematous conditions: Secondary | ICD-10-CM | POA: Diagnosis not present

## 2021-08-15 DIAGNOSIS — L820 Inflamed seborrheic keratosis: Secondary | ICD-10-CM | POA: Diagnosis not present

## 2021-08-15 DIAGNOSIS — L668 Other cicatricial alopecia: Secondary | ICD-10-CM | POA: Diagnosis not present

## 2021-08-21 ENCOUNTER — Other Ambulatory Visit: Payer: Medicare Other

## 2021-08-22 ENCOUNTER — Encounter: Payer: Self-pay | Admitting: Hematology and Oncology

## 2021-08-22 ENCOUNTER — Other Ambulatory Visit: Payer: Medicare Other

## 2021-08-23 ENCOUNTER — Other Ambulatory Visit (INDEPENDENT_AMBULATORY_CARE_PROVIDER_SITE_OTHER): Payer: Medicare Other

## 2021-08-23 ENCOUNTER — Other Ambulatory Visit: Payer: Self-pay

## 2021-08-23 DIAGNOSIS — E113293 Type 2 diabetes mellitus with mild nonproliferative diabetic retinopathy without macular edema, bilateral: Secondary | ICD-10-CM | POA: Diagnosis not present

## 2021-08-23 LAB — COMPREHENSIVE METABOLIC PANEL
ALT: 28 U/L (ref 0–35)
AST: 30 U/L (ref 0–37)
Albumin: 4.3 g/dL (ref 3.5–5.2)
Alkaline Phosphatase: 31 U/L — ABNORMAL LOW (ref 39–117)
BUN: 21 mg/dL (ref 6–23)
CO2: 25 mEq/L (ref 19–32)
Calcium: 10.7 mg/dL — ABNORMAL HIGH (ref 8.4–10.5)
Chloride: 101 mEq/L (ref 96–112)
Creatinine, Ser: 1.25 mg/dL — ABNORMAL HIGH (ref 0.40–1.20)
GFR: 41.81 mL/min — ABNORMAL LOW (ref >60.00)
Glucose, Bld: 114 mg/dL — ABNORMAL HIGH (ref 70–99)
Potassium: 3.7 mEq/L (ref 3.5–5.1)
Sodium: 136 mEq/L (ref 135–145)
Total Bilirubin: 0.7 mg/dL (ref 0.2–1.2)
Total Protein: 7.3 g/dL (ref 6.0–8.3)

## 2021-08-23 LAB — LIPID PANEL
Cholesterol: 158 mg/dL (ref 0–200)
HDL: 45.9 mg/dL (ref >39.00)
LDL Cholesterol: 80 mg/dL (ref 0–99)
NonHDL: 112.01
Total CHOL/HDL Ratio: 3
Triglycerides: 160 mg/dL — ABNORMAL HIGH (ref 0.0–149.0)
VLDL: 32 mg/dL (ref 0.0–40.0)

## 2021-08-23 LAB — HEMOGLOBIN A1C: Hgb A1c MFr Bld: 6.6 % — ABNORMAL HIGH (ref 4.6–6.5)

## 2021-08-24 ENCOUNTER — Ambulatory Visit: Payer: Medicare Other | Admitting: Family Medicine

## 2021-08-26 ENCOUNTER — Other Ambulatory Visit: Payer: Self-pay | Admitting: Hematology and Oncology

## 2021-08-28 ENCOUNTER — Other Ambulatory Visit: Payer: Self-pay

## 2021-08-28 ENCOUNTER — Ambulatory Visit (INDEPENDENT_AMBULATORY_CARE_PROVIDER_SITE_OTHER): Payer: Medicare Other | Admitting: Family Medicine

## 2021-08-28 ENCOUNTER — Encounter: Payer: Self-pay | Admitting: Hematology and Oncology

## 2021-08-28 VITALS — BP 110/60 | HR 62 | Temp 97.2°F | Ht 62.0 in | Wt 168.5 lb

## 2021-08-28 DIAGNOSIS — K760 Fatty (change of) liver, not elsewhere classified: Secondary | ICD-10-CM

## 2021-08-28 DIAGNOSIS — E785 Hyperlipidemia, unspecified: Secondary | ICD-10-CM

## 2021-08-28 DIAGNOSIS — R944 Abnormal results of kidney function studies: Secondary | ICD-10-CM | POA: Diagnosis not present

## 2021-08-28 DIAGNOSIS — E1159 Type 2 diabetes mellitus with other circulatory complications: Secondary | ICD-10-CM | POA: Diagnosis not present

## 2021-08-28 DIAGNOSIS — I152 Hypertension secondary to endocrine disorders: Secondary | ICD-10-CM

## 2021-08-28 DIAGNOSIS — E113293 Type 2 diabetes mellitus with mild nonproliferative diabetic retinopathy without macular edema, bilateral: Secondary | ICD-10-CM

## 2021-08-28 DIAGNOSIS — R7989 Other specified abnormal findings of blood chemistry: Secondary | ICD-10-CM | POA: Diagnosis not present

## 2021-08-28 DIAGNOSIS — E1169 Type 2 diabetes mellitus with other specified complication: Secondary | ICD-10-CM | POA: Diagnosis not present

## 2021-08-28 DIAGNOSIS — R1011 Right upper quadrant pain: Secondary | ICD-10-CM

## 2021-08-28 NOTE — Assessment & Plan Note (Signed)
Increase water intake with increased exercise and miralax use.

## 2021-08-28 NOTE — Progress Notes (Signed)
Patient ID: Katie [name], female    DOB: [birth year], 76 y.o.   MRN: [id number]  This visit was conducted in person.  BP 110/60    Pulse 62    Temp (!) 97.2 F (36.2 C) (Temporal)    Ht 5\' 2"  (1.575 m)    Wt 168 lb 8 oz (76.4 kg)    LMP 08/26/1992 (Approximate)    SpO2 99%    BMI 30.82 kg/m    CC:  Chief Complaint  Patient presents with   Diabetes    Subjective:   HPI: Katie [name] is a 76 y.o. female presenting on 08/28/2021 for Diabetes  Diabetes: Good control.. now back on Trulicity as RUQ pain did not resolve off  Lab Results  Component Value Date   HGBA1C 6.6 (H) 08/23/2021  Using medications without difficulties: Hypoglycemic episodes: Hyperglycemic episodes: Feet problems: none Blood Sugars averaging: eye exam within last year: yes Wt Readings from Last 3 Encounters:  08/28/21 168 lb 8 oz (76.4 kg)  08/07/21 170 lb 1.6 oz (77.2 kg)  08/01/21 170 lb (77.1 kg)    She has been trying to increase exercise:  goal 1 hour 5 days a week at gym, bike  or treadmill   Hypertension:  At goal on losartan HCTZ. BP Readings from Last 3 Encounters:  08/28/21 110/60  08/07/21 (!) 151/45  08/01/21 120/68  Using medication without problems or lightheadedness:  none Chest pain with exertion:none Edema: none Short of breath: none Average home BPs: Other issues:  RUQ pain and elevated ferritin (elevated ferritin did not resolve off of arimidex x 1 month)  Reviewed last GI note, Dr.  Lorenso Courier from 12/7 and last Oncology note , Dr. Rudean Hitt 12/13   Eval in progress.. labwork up unremarkable  CT abd/pelvis 08/19/21 IMPRESSION: 1. Mild diffuse fatty infiltration of liver. 2. Stable hypodense lesion in the inferior right lobe of the liver favored as small cyst. No new liver lesions. 3. Colonic diverticulosis without evidence for acute diverticulitis. 4. Unchanged likely postoperative seroma anterior left chest wall.  Started on miralax for constipation.   Relevant  past medical, surgical, family and social history reviewed and updated as indicated. Interim medical history since our last visit reviewed. Allergies and medications reviewed and updated. Outpatient Medications Prior to Visit  Medication Sig Dispense Refill   alendronate (FOSAMAX) 70 MG tablet Take 1 tablet (70 mg total) by mouth once a week. Take with a full glass of water on an empty stomach. 12 tablet 3   anastrozole (ARIMIDEX) 1 MG tablet TAKE 1 TABLET(1 MG) BY MOUTH DAILY 90 tablet 3   ARNUITY ELLIPTA 100 MCG/ACT AEPB INHALE 1 PUFF INTO THE LUNGS DAILY 30 each 6   aspirin 81 MG EC tablet Take 81 mg by mouth daily. Swallow whole.     atorvastatin (LIPITOR) 20 MG tablet Take 1 tablet (20 mg total) by mouth daily. 90 tablet 3   Calcium Carbonate-Vit D-Min (CALTRATE 600+D PLUS MINERALS) 600-800 MG-UNIT TABS Take 1 tablet by mouth in the morning and at bedtime.     clobetasol (TEMOVATE) 0.05 % external solution Apply 1 application topically 2 (two) times daily as needed (scalp). Reported on 08/16/2015     Dulaglutide (TRULICITY) 1.77 YO/3.7CH SOPN Inject 1.5 mg into the skin once a week. 3 mL 11   fenofibrate (TRICOR) 145 MG tablet Take 1 tablet (145 mg total) by mouth daily. 90 tablet 3   fluticasone (FLONASE) 50 MCG/ACT nasal spray SHAKE LIQUID  AND USE 2 SPRAYS IN EACH NOSTRIL EVERY DAY 16 g 2   gabapentin (NEURONTIN) 300 MG capsule TAKE 1 CAPSULE(300 MG) BY MOUTH AT BEDTIME 90 capsule 3   glucose blood (FREESTYLE TEST STRIPS) test strip USE TO CHECK BLOOD SUGAR DAILY 50 strip 11   loratadine (CLARITIN) 10 MG tablet Take 10 mg by mouth daily.     losartan-hydrochlorothiazide (HYZAAR) 100-25 MG tablet TAKE 1 TABLET BY MOUTH DAILY 90 tablet 3   Multiple Vitamins-Minerals (CENTRUM SILVER 50+WOMEN) TABS Take 1 tablet by mouth daily.     Polyethyl Glycol-Propyl Glycol (SYSTANE OP) Apply to eye.     No facility-administered medications prior to visit.     Per HPI unless specifically indicated in  ROS section below Review of Systems  Constitutional:  Negative for fatigue and fever.  HENT:  Negative for congestion and ear pain.   Eyes:  Negative for pain.  Respiratory:  Negative for cough, chest tightness and shortness of breath.   Cardiovascular:  Negative for chest pain, palpitations and leg swelling.  Gastrointestinal:  Positive for abdominal distention and abdominal pain.  Genitourinary:  Negative for dysuria and vaginal bleeding.  Musculoskeletal:  Negative for back pain.  Neurological:  Negative for syncope, light-headedness and headaches.  Psychiatric/Behavioral:  Negative for dysphoric mood.   Objective:  BP 110/60    Pulse 62    Temp (!) 97.2 F (36.2 C) (Temporal)    Ht 5\' 2"  (1.575 m)    Wt 168 lb 8 oz (76.4 kg)    LMP 08/26/1992 (Approximate)    SpO2 99%    BMI 30.82 kg/m   Wt Readings from Last 3 Encounters:  08/28/21 168 lb 8 oz (76.4 kg)  08/07/21 170 lb 1.6 oz (77.2 kg)  08/01/21 170 lb (77.1 kg)      Physical Exam Constitutional:      General: She is not in acute distress.    Appearance: Normal appearance. She is well-developed. She is not ill-appearing or toxic-appearing.  HENT:     Head: Normocephalic.     Right Ear: Hearing, tympanic membrane, ear canal and external ear normal. Tympanic membrane is not erythematous, retracted or bulging.     Left Ear: Hearing, tympanic membrane, ear canal and external ear normal. Tympanic membrane is not erythematous, retracted or bulging.     Nose: No mucosal edema or rhinorrhea.     Right Sinus: No maxillary sinus tenderness or frontal sinus tenderness.     Left Sinus: No maxillary sinus tenderness or frontal sinus tenderness.     Mouth/Throat:     Pharynx: Uvula midline.  Eyes:     General: Lids are normal. Lids are everted, no foreign bodies appreciated.     Conjunctiva/sclera: Conjunctivae normal.     Pupils: Pupils are equal, round, and reactive to light.  Neck:     Thyroid: No thyroid mass or thyromegaly.      Vascular: No carotid bruit.     Trachea: Trachea normal.  Cardiovascular:     Rate and Rhythm: Normal rate and regular rhythm.     Pulses: Normal pulses.     Heart sounds: Normal heart sounds, S1 normal and S2 normal. No murmur heard.   No friction rub. No gallop.  Pulmonary:     Effort: Pulmonary effort is normal. No tachypnea or respiratory distress.     Breath sounds: Normal breath sounds. No decreased breath sounds, wheezing, rhonchi or rales.  Abdominal:     General: Bowel sounds are  normal.     Palpations: Abdomen is soft.     Tenderness: There is no abdominal tenderness.  Musculoskeletal:     Cervical back: Normal range of motion and neck supple.  Skin:    General: Skin is warm and dry.     Findings: No rash.  Neurological:     Mental Status: She is alert.  Psychiatric:        Mood and Affect: Mood is not anxious or depressed.        Speech: Speech normal.        Behavior: Behavior normal. Behavior is cooperative.        Thought Content: Thought content normal.        Judgment: Judgment normal.      Results for orders placed or performed in visit on 08/23/21  Comprehensive metabolic panel  Result Value Ref Range   Sodium 136 135 - 145 mEq/L   Potassium 3.7 3.5 - 5.1 mEq/L   Chloride 101 96 - 112 mEq/L   CO2 25 19 - 32 mEq/L   Glucose, Bld 114 (H) 70 - 99 mg/dL   BUN 21 6 - 23 mg/dL   Creatinine, Ser 1.25 (H) 0.40 - 1.20 mg/dL   Total Bilirubin 0.7 0.2 - 1.2 mg/dL   Alkaline Phosphatase 31 (L) 39 - 117 U/L   AST 30 0 - 37 U/L   ALT 28 0 - 35 U/L   Total Protein 7.3 6.0 - 8.3 g/dL   Albumin 4.3 3.5 - 5.2 g/dL   GFR 41.81 (L) >60.00 mL/min   Calcium 10.7 (H) 8.4 - 10.5 mg/dL  Lipid panel  Result Value Ref Range   Cholesterol 158 0 - 200 mg/dL   Triglycerides 160.0 (H) 0.0 - 149.0 mg/dL   HDL 45.90 >39.00 mg/dL   VLDL 32.0 0.0 - 40.0 mg/dL   LDL Cholesterol 80 0 - 99 mg/dL   Total CHOL/HDL Ratio 3    NonHDL 112.01   Hemoglobin A1c  Result Value Ref Range    Hgb A1c MFr Bld 6.6 (H) 4.6 - 6.5 %    This visit occurred during the SARS-CoV-2 public health emergency.  Safety protocols were in place, including screening questions prior to the visit, additional usage of staff PPE, and extensive cleaning of exam room while observing appropriate contact time as indicated for disinfecting solutions.   COVID 19 screen:  No recent travel or known exposure to COVID19 The patient denies respiratory symptoms of COVID 19 at this time. The importance of social distancing was discussed today.   Assessment and Plan    Problem List Items Addressed This Visit     Controlled type 2 diabetes with retinopathy ([place]) - Primary (Chronic)    Chronic,Great control.   Increase Trulicity back to 1.5 mg weekly as no change in abd bloating off the medication.  Continue low fat low carb diet, increased exercise and weight loss.  follow up in 3 months for re-eval.       Hypertension associated with diabetes ([place]) (Chronic)    Stable, chronic.  Continue current medication.    losartan 100 mg daily/HCTZ 25 mg daily      Decreased GFR    Increase water intake with increased exercise and miralax use.      Elevated ferritin    Neg GI work up.  Likely acute phase reactant due to inflammation.. possibly due to her lichen planus.  No red flags.       Hyperlipidemia associated with  type 2 diabetes mellitus ([facility])    Stable, chronic.  Continue current medication.   Atorvastatin 20 mg daily.      RUQ pain    Not clearly due to Trulicity.  Neg GI eval other than  mild fatty liver and right stool burden.  Continue miralax daily.      Other Visit Diagnoses     Fatty liver            Eliezer Lofts, MD

## 2021-08-28 NOTE — Assessment & Plan Note (Signed)
Chronic,Great control.   Increase Trulicity back to 1.5 mg weekly as no change in abd bloating off the medication.  Continue low fat low carb diet, increased exercise and weight loss.  follow up in 3 months for re-eval.

## 2021-08-28 NOTE — Assessment & Plan Note (Signed)
Stable, chronic.  Continue current medication. ? ? ?Atorvastatin 20 mg daily ?

## 2021-08-28 NOTE — Assessment & Plan Note (Signed)
Stable, chronic.  Continue current medication.    losartan 100 mg daily/HCTZ 25 mg daily

## 2021-08-28 NOTE — Assessment & Plan Note (Signed)
Not clearly due to Trulicity.  Neg GI eval other than  mild fatty liver and right stool burden.  Continue miralax daily.

## 2021-08-28 NOTE — Assessment & Plan Note (Signed)
Neg GI work up.  Likely acute phase reactant due to inflammation.. possibly due to her lichen planus.  No red flags.

## 2021-08-28 NOTE — Patient Instructions (Addendum)
Keep up great work with healthy eating eating a, low fat diet and increased exercise.  Increase Trulicity 1.5 weekly... call when refill needed.[name] stop if bloating or abdominal pain resolve.  Continue miralax daily for constipation.  Keep up with water in take.

## 2021-08-31 ENCOUNTER — Ambulatory Visit (INDEPENDENT_AMBULATORY_CARE_PROVIDER_SITE_OTHER): Payer: Medicare Other

## 2021-08-31 ENCOUNTER — Other Ambulatory Visit: Payer: Self-pay

## 2021-08-31 DIAGNOSIS — I272 Pulmonary hypertension, unspecified: Secondary | ICD-10-CM

## 2021-08-31 LAB — ECHOCARDIOGRAM COMPLETE
AR max vel: 2.44 cm2
AV Area VTI: 2.42 cm2
AV Area mean vel: 2.4 cm2
AV Mean grad: 4 mmHg
AV Peak grad: 8.3 mmHg
Ao pk vel: 1.44 m/s
Area-P 1/2: 4.17 cm2
P 1/2 time: 446 msec
S' Lateral: 2.6 cm
Single Plane A4C EF: 56.4 %

## 2021-09-04 ENCOUNTER — Other Ambulatory Visit: Payer: Self-pay

## 2021-09-04 ENCOUNTER — Ambulatory Visit (INDEPENDENT_AMBULATORY_CARE_PROVIDER_SITE_OTHER): Payer: Medicare Other | Admitting: Pulmonary Disease

## 2021-09-04 ENCOUNTER — Encounter: Payer: Self-pay | Admitting: Pulmonary Disease

## 2021-09-04 VITALS — BP 138/62 | HR 77 | Temp 97.1°F | Ht 62.0 in | Wt 169.0 lb

## 2021-09-04 DIAGNOSIS — J683 Other acute and subacute respiratory conditions due to chemicals, gases, fumes and vapors: Secondary | ICD-10-CM | POA: Diagnosis not present

## 2021-09-04 DIAGNOSIS — G4733 Obstructive sleep apnea (adult) (pediatric): Secondary | ICD-10-CM

## 2021-09-04 DIAGNOSIS — R053 Chronic cough: Secondary | ICD-10-CM

## 2021-09-04 DIAGNOSIS — C50412 Malignant neoplasm of upper-outer quadrant of left female breast: Secondary | ICD-10-CM

## 2021-09-04 DIAGNOSIS — Z170 Estrogen receptor positive status [ER+]: Secondary | ICD-10-CM | POA: Diagnosis not present

## 2021-09-04 MED ORDER — PULMICORT FLEXHALER 180 MCG/ACT IN AEPB: 1.0000 | INHALATION_SPRAY | Freq: Two times a day (BID) | RESPIRATORY_TRACT | 11 refills | Status: DC

## 2021-09-04 NOTE — Progress Notes (Signed)
Subjective:    Patient ID: Katie [name], female    DOB: 1946-06-29, 76 y.o.   MRN: [id number] Chief Complaint  Patient presents with   Follow-up    Cough    HPI Katie [name] is a 76 year old lifelong never smoker, with a history of HER-2 positive breast cancer status post bilateral mastectomies, radiation and neoadjuvant chemotherapy.  She was initially seen here on 26 January 2020 for abnormal chest x-ray and for issues with cough that started after initiation of anastrozole.  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  At this point inhaled corticosteroid therapy with Arnuity was started for her cough issue.  She was last seen here on 05 March 2021.  At that time she continued to do well on [place].  Her insurance however is not covering [place] anymore and she needs an alternative inhaled corticosteroid.  Subsequently she has also been noted to have mild pulmonary hypertension and duration of this issue led to home sleep study which showed moderate sleep apnea.  She is currently using CPAP of auto CPAP at 5 to 20 cm H2O of pressure.  She notes that she continues to do well with this.  She feels that she gets restorative sleep.  She does not endorse dyspnea.  Cough continues to be controlled.  We reviewed her compliance report and she has been compliant with CPAP.  She has had some issues with abdominal bloating that she relates to her CPAP.  She also has had some air leaks with her mask.  I suspect that her mask is not fitting well.  Voices no other active complaint today.    DATA: 09/24/2019 2D echo: LVEF 60 to 65%, normal LV function, indeterminate LV diastolic parameters. Mildly elevated pulmonary artery systolic pressure. 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  Very mild cylindrical bronchiectasis in the lungs bilaterally with some mucus impaction right lower  lobe.  Mediastinal adenopathy. 02/17/2020 PFT: Normal spirometry and lung volumes 08/31/2021 2D echo: LVEF 60 to 65%, grade I DD, mildly elevated pulmonary artery systolic pressure.  In essence, unchanged from prior  Review of Systems A 10 point review of systems was performed and it is as noted above otherwise negative.  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   Urine WBC increased 01/05/2019   Anemia 10/15/2018   Port-A-Cath in place 09/29/2018   Diabetic retinopathy ([place]) 09/29/2018   Genetic testing 09/23/2018   Family history of leukemia 09/16/2018   Family history of breast cancer    Malignant neoplasm of upper-outer quadrant of left breast in female, estrogen receptor positive ([place]) 09/11/2018   Family history of breast cancer in sister 08/28/2018   Diverticular disease 10/17/2017   Primary osteoarthritis of right knee 03/07/2017   HSV-1 (herpes simplex virus 1) infection, 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   Vitamin D deficiency 10/11/2016   Family history of thyroid disorder 04/04/2015   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  Counseling regarding end of life decision making 10/04/2014   OA (osteoarthritis) of neck 08/02/2014   History of duodenal ulcer 08/02/2014   Seasonal allergies 08/02/2014   Hypertension associated with diabetes ([place]) 08/02/2014   Hyperlipidemia associated with type 2 diabetes mellitus ([place]) 34/19/3790   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   History of  joint replacement 03/01/2014   Controlled type 2 diabetes with retinopathy ([place]) 11/07/2009   Social History   Tobacco Use   Smoking status: Never   Smokeless tobacco: Never  Substance Use Topics   Alcohol use: Not Currently    Alcohol/week: 0.0 standard drinks    Comment: rarely   Allergies  Allergen Reactions   Erythromycin Anaphylaxis    REACTION: Rash, hives   Ciprofloxacin Rash   Daypro [Oxaprozin] Rash   Enalapril Maleate Nausea Only and Rash   Nsaids Nausea Only   Vioxx [Rofecoxib] Nausea Only   Current Meds  Medication Sig   alendronate (FOSAMAX) 70 MG tablet TAKE 1 TABLET(70 MG) BY MOUTH 1 TIME A WEEK WITH A FULL GLASS OF WATER ON AN EMPTY STOMACH   anastrozole (ARIMIDEX) 1 MG tablet TAKE 1 TABLET(1 MG) BY MOUTH DAILY   ARNUITY ELLIPTA 100 MCG/ACT AEPB INHALE 1 PUFF INTO THE LUNGS DAILY   aspirin 81 MG EC tablet Take 81 mg by mouth daily. Swallow whole.   atorvastatin (LIPITOR) 20 MG tablet Take 1 tablet (20 mg total) by mouth daily.   Calcium Carbonate-Vit D-Min (CALTRATE 600+D PLUS MINERALS) 600-800 MG-UNIT TABS Take 1 tablet by mouth in the morning and at bedtime.   clobetasol (TEMOVATE) 0.05 % external solution Apply 1 application topically 2 (two) times daily as needed (scalp). Reported on 08/16/2015   Dulaglutide (TRULICITY) 1.5 WI/0.9BD SOPN Inject 1.5 mg into the skin once a week.   fenofibrate (TRICOR) 145 MG tablet Take 1 tablet (145 mg total) by mouth daily.   fluticasone (FLONASE) 50 MCG/ACT nasal spray SHAKE LIQUID AND USE 2 SPRAYS IN EACH NOSTRIL EVERY DAY   gabapentin (NEURONTIN) 300 MG capsule TAKE 1 CAPSULE(300 MG) BY MOUTH AT BEDTIME   glucose blood (FREESTYLE TEST STRIPS) test strip USE TO CHECK BLOOD SUGAR DAILY   loratadine (CLARITIN) 10 MG tablet Take 10 mg by mouth daily.   losartan-hydrochlorothiazide (HYZAAR) 100-25 MG tablet TAKE 1 TABLET BY MOUTH DAILY   Multiple Vitamins-Minerals (CENTRUM SILVER 50+WOMEN) TABS Take 1 tablet by mouth daily.    Polyethyl Glycol-Propyl Glycol (SYSTANE OP) Apply to eye.   Immunization History  Administered Date(s) Administered   Fluad Quad(high Dose 65+) 04/30/2019, 05/12/2020   Hep A / Hep B 05/29/2009, 07/03/2009, 11/24/2009   Influenza, High Dose Seasonal PF 04/18/2014, 04/13/2015, 04/30/2018, 04/10/2021   Influenza, Seasonal, Injecte, Preservative Fre 06/26/2016   Influenza-Unspecified 05/17/2009, 04/02/2011, 05/20/2012, 06/02/2013, 07/01/2013, 04/18/2014, 04/13/2015, 06/27/2016, 04/11/2017   PFIZER Comirnaty(Gray Top)Covid-19 Tri-Sucrose Vaccine 11/29/2020   PFIZER(Purple Top)SARS-COV-2 Vaccination 10/06/2019, 10/27/2019, 04/24/2020   Pfizer Covid-19 Vaccine Bivalent Booster 73yr & up 05/02/2021   Pneumococcal Conjugate-13 04/18/2014   Pneumococcal Polysaccharide-23 10/06/2015   Td 05/17/2009, 05/12/2020   Typhoid Inactivated 05/17/2009   Yellow Fever 05/17/2009   Zoster Recombinat (Shingrix) 06/07/2021, 08/11/2021   Zoster, Live 04/02/2011        Objective:   Physical Exam BP 138/62 (BP Location: Left Arm, Patient Position: Sitting, Cuff Size: Normal)    Pulse 77    Temp (!) 97.1 F (36.2 C) (Oral)    Ht 5' 2"  (1.575 m)    Wt 169 lb (76.7 kg)  LMP 08/26/1992 (Approximate)    SpO2 99%    BMI 30.91 kg/m  GENERAL: Awake, alert, fully ambulatory, no acute distress.  No conversational dyspnea HEAD: Normocephalic, atraumatic. EYES: Pupils equal, round, reactive to light.  No scleral icterus. MOUTH: Nose/mouth/throat not examined due to masking requirements for COVID 19. NECK: Supple. No thyromegaly. Trachea midline. No JVD.  No adenopathy. PULMONARY: Excellent air entry bilaterally.  Lungs clear to auscultation bilaterally. CARDIOVASCULAR: S1 and S2. Regular rate and rhythm.  No rubs murmurs or gallops heard. GASTROINTESTINAL: Benign. MUSCULOSKELETAL: No joint deformity, no clubbing, no edema. NEUROLOGIC: Awake, alert, no focal deficits.  Speech is fluent.  No gait disturbance  noted, fully ambulatory. SKIN: Intact,warm,dry. PSYCH: Mood and behavior appropriate     Assessment & Plan:     ICD-10-CM   1. Reactive airways dysfunction syndrome ([place])  J68.3    Arnuity no longer covered by her insurance company Switched to Pulmicort 180 mcg 1 puff twice a day Patient taught the proper use of the inhaler    2. Chronic cough  R05.3    Markedly improved on ICS Continue ICS switched to Pulmicort as above    3. OSA (obstructive sleep apnea)  G47.33    Compliant with CPAP (auto CPAP) Having issues with mask, bloating Instructed to contact [facility] may need mask refitting    4. Malignant neoplasm of upper-outer quadrant of left breast in female, estrogen receptor positive ([place])  C50.412    Z17.0    This issue adds complexity to her management Followed by oncology Stage IIIb Currently no evidence of recurrent disease     Meds ordered this encounter  Medications   budesonide (PULMICORT FLEXHALER) 180 MCG/ACT inhaler    Sig: Inhale 1 puff into the lungs 2 (two) times daily.    Dispense:  1 each    Refill:  11   Patient appears to be doing well from her chronic cough issues likely related to reactive airways dysfunction.  She responds well to ICS.  ICS switched to Pulmicort as Arnuity no longer preferred by her insurance company.  Patient has been compliant with CPAP for obstructive sleep apnea.  Continue use of CPAP.  She has been having some issues with mask leaking, I have encouraged her to contact [facility] as she may need mask refitting.  We will see the patient in follow-up in 3 months time she is to call sooner should any new problems arise.   Renold Don, MD Advanced Bronchoscopy P[facility]    *This note was dictated using voice recognition software/Dragon.  Despite best efforts to proofread, errors can occur which can change the meaning. Any transcriptional errors that result from this process are unintentional and may not be  fully corrected at the time of dictation.

## 2021-09-04 NOTE — Patient Instructions (Addendum)
I have changed your inhaler to Pulmicort Flexhaler 1 puff twice a day, rinse your mouth well after you use it.  Continue using your CPAP.  For the sensation of bloating you can use a medication like Gas-X.  You may want to check with [facility] to see if they can get to a better fitting mask that may help as well.  We will see her in follow-up in 3 months time call sooner should any new problems arise.

## 2021-09-11 ENCOUNTER — Encounter: Payer: Self-pay | Admitting: Internal Medicine

## 2021-09-11 ENCOUNTER — Ambulatory Visit (AMBULATORY_SURGERY_CENTER): Payer: Medicare Other | Admitting: Internal Medicine

## 2021-09-11 VITALS — BP 115/78 | HR 85 | Temp 97.5°F | Resp 13 | Ht 62.0 in | Wt 170.0 lb

## 2021-09-11 DIAGNOSIS — R1013 Epigastric pain: Secondary | ICD-10-CM | POA: Diagnosis not present

## 2021-09-11 DIAGNOSIS — K2970 Gastritis, unspecified, without bleeding: Secondary | ICD-10-CM | POA: Diagnosis not present

## 2021-09-11 DIAGNOSIS — I2510 Atherosclerotic heart disease of native coronary artery without angina pectoris: Secondary | ICD-10-CM | POA: Diagnosis not present

## 2021-09-11 DIAGNOSIS — K2950 Unspecified chronic gastritis without bleeding: Secondary | ICD-10-CM | POA: Diagnosis not present

## 2021-09-11 DIAGNOSIS — I10 Essential (primary) hypertension: Secondary | ICD-10-CM | POA: Diagnosis not present

## 2021-09-11 DIAGNOSIS — K2980 Duodenitis without bleeding: Secondary | ICD-10-CM | POA: Diagnosis not present

## 2021-09-11 MED ORDER — SODIUM CHLORIDE 0.9 % IV SOLN: 500.0000 mL | Freq: Once | INTRAVENOUS | Status: DC

## 2021-09-11 NOTE — Progress Notes (Signed)
Called to room to assist during endoscopic procedure.  Patient ID and intended procedure confirmed with present staff. Received instructions for my participation in the procedure from the performing physician.  

## 2021-09-11 NOTE — Progress Notes (Signed)
GASTROENTEROLOGY PROCEDURE H&P NOTE   Primary Care Physician: [name], MD    Reason for Procedure:   Epigastric ab pain  Plan:    EGD  Patient is appropriate for endoscopic procedure(s) in the ambulatory ([place]) setting.  The nature of the procedure, as well as the risks, benefits, and alternatives were carefully and thoroughly reviewed with the patient. Ample time for discussion and questions allowed. The patient understood, was satisfied, and agreed to proceed.     HPI: Katie [name] is a 76 y.o. female who presents for EGD for evaluation of epigastric ab pain .  Patient was most recently seen in the Gastroenterology Clinic on 08/01/21.  No interval change in medical history since that appointment. Please refer to that note for full details regarding GI history and clinical presentation.   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, small, not treating-watching    Blood transfusion without reported diagnosis    Cancer ([place])    breast 08/2018   Coronary artery disease    Diverticulitis    DJD (degenerative joint disease)    DM type 2 (diabetes mellitus, type 2) ([place])    Family history of breast cancer    Fibrocystic breast disease    GERD (gastroesophageal reflux disease)    History of chicken pox    Hypercholesteremia    Hypertension    Lichen planopilaris    Urinary incontinence    Vertigo 12/2019   1 episode    Past Surgical History:  Procedure Laterality Date   BREAST IMPLANT REMOVAL Bilateral 03/08/2019   Procedure: Removal of Bilateral Breast Implants;  Surgeon: Rolm Bookbinder, MD;  Location: [facility];  Service: General;  Laterality: Bilateral;   BREAST SURGERY  02/1975   Left Breast Biopsy-Fibrocystic Disease   BREAST SURGERY  10/1981   Bialteral Capsulectomy Breast Surgery   BREAST SURGERY  07/1998   Replace Bilateral Silicone Implants   BREAST SURGERY  01/1976   [place] breast surgery to remove  fibrocystic tissue and replace with silicone implants   CATARACT EXTRACTION W/PHACO Right 02/08/2020   Procedure: CATARACT EXTRACTION PHACO AND INTRAOCULAR LENS PLACEMENT ([place]) RIGHT DIABETIC;  Surgeon: Birder [name], MD;  Location: [facility];  Service: Ophthalmology;  Laterality: Right;  3.55 0:33.7   CATARACT EXTRACTION W/PHACO Left 02/29/2020   Procedure: CATARACT EXTRACTION PHACO AND INTRAOCULAR LENS PLACEMENT ([facility]) LEFT DIABETIC 2.93  00:27.1;  Surgeon: Birder [name], MD;  Location: [facility];  Service: Ophthalmology;  Laterality: Left;  Diabetic - oral meds   CHOLECYSTECTOMY N/A 07/08/2016   Procedure: LAPAROSCOPIC CHOLECYSTECTOMY;  Surgeon: [name], MD;  Location: [facility];  Service: General;  Laterality: N/A;   COLONOSCOPY WITH PROPOFOL N/A 11/07/2017   Procedure: COLONOSCOPY WITH PROPOFOL;  Surgeon: Jonathon Bellows, MD;  [facility];  Service: Gastroenterology;  Laterality: N/A;   DILATION AND CURETTAGE OF UTERUS  01/1994   ESOPHAGOGASTRODUODENOSCOPY (EGD) WITH PROPOFOL N/A 12/05/2016   Procedure: ESOPHAGOGASTRODUODENOSCOPY (EGD) WITH PROPOFOL;  Surgeon: Jonathon Bellows, MD;  Location: [facility];  Service: Endoscopy;  Laterality: N/A;   FINGER SURGERY  2001 & 2003   Trigger Finger   FOOT SURGERY  1980   To Relieve Pinched Nerve   FOOT SURGERY  07/2008   Left Foot-Plantar Fasciitis   JOINT REPLACEMENT  04/19/2013   Hip Replacement-Right   MASTECTOMY W/ SENTINEL NODE BIOPSY Bilateral 03/08/2019   Procedure: BILATERAL MASTECTOMIES  WITH LEFT AXILLARY SENTINEL LYMPH NODE BIOPSY AND BLUE DYE INJECTION;  Surgeon: Rolm Bookbinder, MD;  Location: [facility];  Service: General;  Laterality: Bilateral;   PARTIAL HIP ARTHROPLASTY Right    PLACEMENT OF BREAST IMPLANTS  12/26/2004   Replace Failed Implants   REPLACEMENT TOTAL KNEE Left    2019-   RHINOPLASTY  03/1964   To Correct Deviated Septum    Prior to Admission medications   Medication Sig Start  Date End Date Taking? Authorizing Provider  alendronate (FOSAMAX) 70 MG tablet TAKE 1 TABLET(70 MG) BY MOUTH 1 TIME A WEEK WITH A FULL GLASS OF WATER ON AN EMPTY STOMACH 08/28/21  Yes [name], MD  anastrozole (ARIMIDEX) 1 MG tablet TAKE 1 TABLET(1 MG) BY MOUTH DAILY 05/14/21  Yes [name], MD  aspirin 81 MG EC tablet Take 81 mg by mouth daily. Swallow whole.   Yes [provider]  atorvastatin (LIPITOR) 20 MG tablet Take 1 tablet (20 mg total) by mouth daily. 11/16/20  Yes Agbor-Etang, Aaron Edelman, MD  budesonide (PULMICORT FLEXHALER) 180 MCG/ACT inhaler Inhale 1 puff into the lungs 2 (two) times daily. 09/04/21  Yes Tyler Pita, MD  Calcium Carbonate-Vit D-Min (CALTRATE 600+D PLUS MINERALS) 600-800 MG-UNIT TABS Take 1 tablet by mouth in the morning and at bedtime.   Yes [provider]  clobetasol (TEMOVATE) 0.05 % external solution Apply 1 application topically 2 (two) times daily as needed (scalp). Reported on 08/16/2015 06/02/14  Yes [provider]  Dulaglutide (TRULICITY) 1.5 KG/2.5KY SOPN Inject 1.5 mg into the skin once a week. 05/25/21  Yes Bedsole, Amy E, MD  fenofibrate (TRICOR) 145 MG tablet Take 1 tablet (145 mg total) by mouth daily. 10/16/20  Yes Agbor-Etang, Aaron Edelman, MD  fluticasone (FLONASE) 50 MCG/ACT nasal spray SHAKE LIQUID AND USE 2 SPRAYS IN EACH NOSTRIL EVERY DAY 05/16/21  Yes Bedsole, Amy E, MD  gabapentin (NEURONTIN) 300 MG capsule TAKE 1 CAPSULE(300 MG) BY MOUTH AT BEDTIME 08/13/21  Yes [name], MD  glucose blood (FREESTYLE TEST STRIPS) test strip USE TO CHECK BLOOD SUGAR DAILY 04/08/21  Yes Bedsole, Amy E, MD  loratadine (CLARITIN) 10 MG tablet Take 10 mg by mouth daily.   Yes [provider]  losartan-hydrochlorothiazide (HYZAAR) 100-25 MG tablet TAKE 1 TABLET BY MOUTH DAILY 11/16/20  Yes Bedsole, Amy E, MD  Multiple Vitamins-Minerals (CENTRUM SILVER 50+WOMEN) TABS Take 1 tablet by mouth daily.   Yes [provider]   Polyethyl Glycol-Propyl Glycol (SYSTANE OP) Apply to eye. Patient not taking: Reported on 09/11/2021    [provider]  prochlorperazine (COMPAZINE) 10 MG tablet Take 1 tablet (10 mg total) by mouth every 6 (six) hours as needed (Nausea or vomiting). 09/16/18 01/20/19  [name], MD    Current Outpatient Medications  Medication Sig Dispense Refill   alendronate (FOSAMAX) 70 MG tablet TAKE 1 TABLET(70 MG) BY MOUTH 1 TIME A WEEK WITH A FULL GLASS OF WATER ON AN EMPTY STOMACH 12 tablet 3   anastrozole (ARIMIDEX) 1 MG tablet TAKE 1 TABLET(1 MG) BY MOUTH DAILY 90 tablet 3   aspirin 81 MG EC tablet Take 81 mg by mouth daily. Swallow whole.     atorvastatin (LIPITOR) 20 MG tablet Take 1 tablet (20 mg total) by mouth daily. 90 tablet 3   budesonide (PULMICORT FLEXHALER) 180 MCG/ACT inhaler Inhale 1 puff into the lungs 2 (two) times daily. 1 each 11   Calcium Carbonate-Vit D-Min (CALTRATE 600+D PLUS MINERALS) 600-800 MG-UNIT TABS  Take 1 tablet by mouth in the morning and at bedtime.     clobetasol (TEMOVATE) 0.05 % external solution Apply 1 application topically 2 (two) times daily as needed (scalp). Reported on 08/16/2015     Dulaglutide (TRULICITY) 1.5 GF/8.4KJ SOPN Inject 1.5 mg into the skin once a week. 3 mL 11   fenofibrate (TRICOR) 145 MG tablet Take 1 tablet (145 mg total) by mouth daily. 90 tablet 3   fluticasone (FLONASE) 50 MCG/ACT nasal spray SHAKE LIQUID AND USE 2 SPRAYS IN EACH NOSTRIL EVERY DAY 16 g 2   gabapentin (NEURONTIN) 300 MG capsule TAKE 1 CAPSULE(300 MG) BY MOUTH AT BEDTIME 90 capsule 3   glucose blood (FREESTYLE TEST STRIPS) test strip USE TO CHECK BLOOD SUGAR DAILY 50 strip 11   loratadine (CLARITIN) 10 MG tablet Take 10 mg by mouth daily.     losartan-hydrochlorothiazide (HYZAAR) 100-25 MG tablet TAKE 1 TABLET BY MOUTH DAILY 90 tablet 3   Multiple Vitamins-Minerals (CENTRUM SILVER 50+WOMEN) TABS Take 1 tablet by mouth daily.     Polyethyl Glycol-Propyl Glycol  (SYSTANE OP) Apply to eye. (Patient not taking: Reported on 09/11/2021)     Current Facility-Administered Medications  Medication Dose Route Frequency Provider Last Rate Last Admin   0.9 %  sodium chloride infusion  500 mL Intravenous Once Sharyn Creamer, MD        Allergies as of 09/11/2021 - Review Complete 09/11/2021  Allergen Reaction Noted   Erythromycin Anaphylaxis 06/26/2009   Ciprofloxacin Rash 06/26/2009   Daypro [oxaprozin] Rash 10/13/2012   Enalapril maleate Nausea Only and Rash 10/06/2015   Nsaids Nausea Only 10/13/2012   Vioxx [rofecoxib] Nausea Only 10/13/2012    Family History  Problem Relation Age of Onset   Arthritis Mother    Hypertension Mother    Heart disease Father    Diabetes Father    Heart attack Father    Breast cancer Sister 31       d. 89   Hypertension Brother    Leukemia Brother    Coronary artery disease Brother    Heart attack Brother     Social History   Socioeconomic History   Marital status: Married    Spouse name: Not on file   Number of children: 1   Years of education: Not on file   Highest education level: Not on file  Occupational History   Occupation: Retired  Tobacco Use   Smoking status: Never   Smokeless tobacco: Never  Vaping Use   Vaping Use: Never used  Substance and Sexual Activity   Alcohol use: Not Currently    Alcohol/week: 0.0 standard drinks    Comment: rarely   Drug use: No   Sexual activity: Not Currently    Partners: Male  Other Topics Concern   Not on file  Social History Narrative   Married for 37 years.    She has one child and one stepston.      Social Determinants of Health   Financial Resource Strain: Not on file  Food Insecurity: Not on file  Transportation Needs: Not on file  Physical Activity: Not on file  Stress: Not on file  Social Connections: Not on file  Intimate Partner Violence: Not on file    Physical Exam: Vital signs in last 24 hours: BP (!) 122/55    Pulse 68    Temp  (!) 97.5 F (36.4 C) (Skin)    Ht 5\' 2"  (1.575 m)    Wt 170  lb (77.1 kg)    LMP 08/26/1992 (Approximate)    SpO2 98%    BMI 31.09 kg/m  GEN: NAD EYE: Sclerae anicteric ENT: MMM CV: Non-tachycardic Pulm: No increased WOB GI: Soft NEURO:  Alert & Oriented   [name], MD [facility]   09/11/2021 10:15 AM

## 2021-09-11 NOTE — Op Note (Addendum)
[place] Patient Name: Katie [name] Procedure Date: 09/11/2021 10:19 AM MRN: [id number] Endoscopist: Sonny Masters "Katie [name] ,  Age: 76 Referring MD:  Date of Birth: 07-17-1946 Gender: Female Account #: [id number] Procedure:                Upper GI endoscopy Indications:              Epigastric abdominal pain Medicines:                Monitored Anesthesia Care Procedure:                Pre-Anesthesia Assessment:                           - Prior to the procedure, a History and Physical                            was performed, and patient medications and                            allergies were reviewed. The patient's tolerance of                            previous anesthesia was also reviewed. The risks                            and benefits of the procedure and the sedation                            options and risks were discussed with the patient.                            All questions were answered, and informed consent                            was obtained. Prior Anticoagulants: The patient has                            taken no previous anticoagulant or antiplatelet                            agents. ASA Grade Assessment: III - A patient with                            severe systemic disease. After reviewing the risks                            and benefits, the patient was deemed in                            satisfactory condition to undergo the procedure.                           After obtaining informed consent, the endoscope was  passed under direct vision. Throughout the                            procedure, the patient's blood pressure, pulse, and                            oxygen saturations were monitored continuously. The                            Endoscope was introduced through the mouth, and                            advanced to the second part of duodenum. The upper                            GI endoscopy was  accomplished without difficulty.                            The patient tolerated the procedure well. Scope In: Scope Out: Findings:                 The examined esophagus was normal.                           A medium amount of food (residue) was found in the                            gastric body.                           Biopsies were taken with a cold forceps on the                            greater curvature of the gastric body, on the                            lesser curvature of the gastric body, at the                            incisura, on the greater curvature of the gastric                            antrum and on the lesser curvature of the gastric                            antrum for histology.                           The examined duodenum was normal. Biopsies for                            histology were taken with a cold forceps for  evaluation of celiac disease. Complications:            No immediate complications. Estimated Blood Loss:     Estimated blood loss was minimal. Impression:               - Normal esophagus.                           - A medium amount of food (residue) in the stomach.                           - Normal examined duodenum. Biopsied.                           - Biopsies were taken with a cold forceps for                            histology on the greater curvature of the gastric                            body, on the lesser curvature of the gastric body,                            at the incisura, on the greater curvature of the                            gastric antrum and on the lesser curvature of the                            gastric antrum. Recommendation:           - Discharge patient to home (with escort).                           - It is possible that your pain could be related to                            delayed gastric emptying.                           - Recommend gastric emptying study.                            - Await pathology results.                           - Return to GI clinic in 1 month.                           - The findings and recommendations were discussed                            with the patient. Sonny Masters "Katie [name],  09/11/2021 10:41:11 AM

## 2021-09-11 NOTE — Patient Instructions (Addendum)
Await pathology results.  Return to GI clinic in 1 month.  YOU HAD AN ENDOSCOPIC PROCEDURE TODAY AT [facility]:   Refer to the procedure report that was given to you for any specific questions about what was found during the examination.  If the procedure report does not answer your questions, please call your gastroenterologist to clarify.  If you requested that your care partner not be given the details of your procedure findings, then the procedure report has been included in a sealed envelope for you to review at your convenience later.  YOU SHOULD EXPECT: Some feelings of bloating in the abdomen. Passage of more gas than usual.  Walking can help get rid of the air that was put into your GI tract during the procedure and reduce the bloating. If you had a lower endoscopy (such as a colonoscopy or flexible sigmoidoscopy) you may notice spotting of blood in your stool or on the toilet paper. If you underwent a bowel prep for your procedure, you may not have a normal bowel movement for a few days.  Please Note:  You might notice some irritation and congestion in your nose or some drainage.  This is from the oxygen used during your procedure.  There is no need for concern and it should clear up in a day or so.  SYMPTOMS TO REPORT IMMEDIATELY:   Following upper endoscopy (EGD)  Vomiting of blood or coffee ground material  New chest pain or pain under the shoulder blades  Painful or persistently difficult swallowing  New shortness of breath  Fever of 100F or higher  Black, tarry-looking stools  For urgent or emergent issues, a gastroenterologist can be reached at any hour by calling [phone]. Do not use MyChart messaging for urgent concerns.    DIET:  We do recommend a small meal at first, but then you may proceed to your regular diet.  Drink plenty of fluids but you should avoid alcoholic beverages for 24 hours.  ACTIVITY:  You should plan to take it easy for the  rest of today and you should NOT DRIVE or use heavy machinery until tomorrow (because of the sedation medicines used during the test).    FOLLOW UP: Our staff will call the number listed on your records 48-72 hours following your procedure to check on you and address any questions or concerns that you may have regarding the information given to you following your procedure. If we do not reach you, we will leave a message.  We will attempt to reach you two times.  During this call, we will ask if you have developed any symptoms of COVID 19. If you develop any symptoms (ie: fever, flu-like symptoms, shortness of breath, cough etc.) before then, please call [phone].  If you test positive for Covid 19 in the 2 weeks post procedure, please call and report this information to [place].    If any biopsies were taken you will be contacted by phone or by letter within the next 1-3 weeks.  Please call us at [phone] if you have not heard about the biopsies in 3 weeks.    SIGNATURES/CONFIDENTIALITY: You and/or your care partner have signed paperwork which will be entered into your electronic medical record.  These signatures attest to the fact that that the information above on your After Visit Summary has been reviewed and is understood.  Full responsibility of the confidentiality of this discharge information lies with you and/or your care-partner.

## 2021-09-11 NOTE — Progress Notes (Signed)
Pt's states no medical or surgical changes since previsit or office visit. VS assessed by D.T 

## 2021-09-11 NOTE — Progress Notes (Signed)
A and O x3. Report to RN. Tolerated MAC anesthesia well.

## 2021-09-12 ENCOUNTER — Telehealth: Payer: Self-pay

## 2021-09-12 DIAGNOSIS — R1013 Epigastric pain: Secondary | ICD-10-CM

## 2021-09-12 DIAGNOSIS — R110 Nausea: Secondary | ICD-10-CM

## 2021-09-12 NOTE — Telephone Encounter (Signed)
Review of colonoscopy procedure recommendations indicates pt sx's may be r/t delayed gastric emptying. Recommendation made for pt to have GES and 1 mo f/u. Uncertain if GES should be ordered PRIOR to f/u appt, with results to be reviewed during OV. Routing this message to Dr. Lorenso Courier for further clarification. Will await her response.

## 2021-09-12 NOTE — Telephone Encounter (Signed)
Pt scheduled for GES 09/12/21. 1 mo f/u appt scheduled 10/09/21 @ 230pm to review results of GES and f/u from EGD 09/11/21. Appt reminder mailed. Called pt and left a detailed message making her aware of this information as well.

## 2021-09-13 ENCOUNTER — Encounter: Payer: Self-pay | Admitting: Internal Medicine

## 2021-09-13 ENCOUNTER — Telehealth: Payer: Self-pay

## 2021-09-13 NOTE — Telephone Encounter (Signed)
°  Follow up Call-  Call back number 09/11/2021  Post procedure Call Back phone  # [phone]  Permission to leave phone message Yes  Some recent data might be hidden     Patient questions:  Do you have a fever, pain , or abdominal swelling? No. Pain Score  0 *  Have you tolerated food without any problems? Yes.    Have you been able to return to your normal activities? Yes.    Do you have any questions about your discharge instructions: Diet   No. Medications  No. Follow up visit  No.  Do you have questions or concerns about your Care? No.  Actions: * If pain score is 4 or above: No action needed, pain <4.

## 2021-09-18 ENCOUNTER — Other Ambulatory Visit: Payer: Self-pay

## 2021-09-18 NOTE — Telephone Encounter (Signed)
Called and spoke to patient about medication change from arnuity. Patient states she is currently finishing up arnuity and will start Pulmicort flex inhaler once arnuity is complete. Nothing further needed.

## 2021-09-20 ENCOUNTER — Other Ambulatory Visit (HOSPITAL_COMMUNITY): Payer: Self-pay | Admitting: Internal Medicine

## 2021-09-20 ENCOUNTER — Encounter: Payer: Self-pay | Admitting: Internal Medicine

## 2021-09-20 DIAGNOSIS — R1013 Epigastric pain: Secondary | ICD-10-CM

## 2021-10-02 ENCOUNTER — Ambulatory Visit (HOSPITAL_COMMUNITY): Payer: Medicare Other

## 2021-10-02 ENCOUNTER — Other Ambulatory Visit: Payer: Self-pay | Admitting: Family Medicine

## 2021-10-08 DIAGNOSIS — H26491 Other secondary cataract, right eye: Secondary | ICD-10-CM | POA: Diagnosis not present

## 2021-10-08 DIAGNOSIS — H26493 Other secondary cataract, bilateral: Secondary | ICD-10-CM | POA: Diagnosis not present

## 2021-10-08 LAB — HM DIABETES EYE EXAM

## 2021-10-09 ENCOUNTER — Ambulatory Visit: Payer: Medicare Other | Admitting: Internal Medicine

## 2021-10-10 ENCOUNTER — Other Ambulatory Visit: Payer: Self-pay

## 2021-10-10 ENCOUNTER — Ambulatory Visit (HOSPITAL_COMMUNITY)
Admission: RE | Admit: 2021-10-10 | Discharge: 2021-10-10 | Disposition: A | Discharge status: Home / Self Care | Payer: Medicare Other | Source: Ambulatory Visit | Attending: Internal Medicine | Admitting: Internal Medicine

## 2021-10-10 DIAGNOSIS — E119 Type 2 diabetes mellitus without complications: Secondary | ICD-10-CM | POA: Diagnosis not present

## 2021-10-10 DIAGNOSIS — R1010 Upper abdominal pain, unspecified: Secondary | ICD-10-CM | POA: Diagnosis not present

## 2021-10-10 DIAGNOSIS — R1013 Epigastric pain: Secondary | ICD-10-CM | POA: Insufficient documentation

## 2021-10-10 MED ORDER — TECHNETIUM TC 99M SULFUR COLLOID FILTERED
2.0000 | Freq: Once | INTRAVENOUS | Status: AC | PRN
  Administered 2021-10-10: 2 via INTRADERMAL

## 2021-10-11 ENCOUNTER — Other Ambulatory Visit
Admission: RE | Admit: 2021-10-11 | Discharge: 2021-10-11 | Disposition: A | Discharge status: Home / Self Care | Payer: Medicare Other | Source: Ambulatory Visit | Attending: Cardiology | Admitting: Cardiology

## 2021-10-11 ENCOUNTER — Other Ambulatory Visit: Payer: Self-pay

## 2021-10-11 DIAGNOSIS — E785 Hyperlipidemia, unspecified: Secondary | ICD-10-CM | POA: Diagnosis not present

## 2021-10-11 DIAGNOSIS — E1169 Type 2 diabetes mellitus with other specified complication: Secondary | ICD-10-CM | POA: Insufficient documentation

## 2021-10-11 LAB — LIPID PANEL
Cholesterol: 147 mg/dL (ref 0–200)
HDL: 46 mg/dL (ref >40)
LDL Cholesterol: 75 mg/dL (ref 0–99)
Total CHOL/HDL Ratio: 3.2 RATIO
Triglycerides: 129 mg/dL (ref <150)
VLDL: 26 mg/dL (ref 0–40)

## 2021-10-14 ENCOUNTER — Other Ambulatory Visit: Payer: Self-pay | Admitting: Family Medicine

## 2021-10-15 NOTE — Telephone Encounter (Signed)
Valtrex Last filled:  07/10/21, #6 Last OV:  08/28/21, 3 mo DM f/u Next OV:  11/27/21, f/u

## 2021-10-18 ENCOUNTER — Other Ambulatory Visit: Payer: Self-pay

## 2021-10-18 ENCOUNTER — Encounter: Payer: Self-pay | Admitting: Cardiology

## 2021-10-18 ENCOUNTER — Ambulatory Visit (INDEPENDENT_AMBULATORY_CARE_PROVIDER_SITE_OTHER): Payer: Medicare Other | Admitting: Cardiology

## 2021-10-18 VITALS — BP 134/62 | HR 74 | Ht 62.0 in | Wt 167.0 lb

## 2021-10-18 DIAGNOSIS — I2510 Atherosclerotic heart disease of native coronary artery without angina pectoris: Secondary | ICD-10-CM | POA: Diagnosis not present

## 2021-10-18 DIAGNOSIS — E782 Mixed hyperlipidemia: Secondary | ICD-10-CM | POA: Diagnosis not present

## 2021-10-18 DIAGNOSIS — I10 Essential (primary) hypertension: Secondary | ICD-10-CM | POA: Diagnosis not present

## 2021-10-18 NOTE — Patient Instructions (Addendum)
Medication Instructions:  Your physician recommends that you continue on your current medications as directed. Please refer to the Current Medication list given to you today.  *If you need a refill on your cardiac medications before your next appointment, please call your pharmacy*   Lab Work:  Your physician recommends that you return for a FASTING lipid profile: The week prior to your 1 year follow up  - You will need to be fasting. Please do not have anything to eat or drink after midnight the morning you have the lab work. You may only have water or black coffee with no cream or sugar.   We will call you closer to your appointment time and schedule you in our office for this lab draw.   Testing/Procedures: None ordered   Follow-Up: At [facility], you and your health needs are our priority.  As part of our continuing mission to provide you with exceptional heart care, we have created designated Provider Care Teams.  These Care Teams include your primary Cardiologist (physician) and Advanced Practice Providers (APPs -  Physician Assistants and Nurse Practitioners) who all work together to provide you with the care you need, when you need it.  We recommend signing up for the patient portal called "MyChart".  Sign up information is provided on this After Visit Summary.  MyChart is used to connect with patients for Virtual Visits (Telemedicine).  Patients are able to view lab/test results, encounter notes, upcoming appointments, etc.  Non-urgent messages can be sent to your provider as well.   To learn more about what you can do with MyChart, go to [email or web address].    Your next appointment:   1 year(s)  The format for your next appointment:   In Person  Provider:   You may see Kate Sable, MD or one of the following Advanced Practice Providers on your designated Care Team:   Murray Hodgkins, NP Christell Faith, PA-C Cadence Kathlen Mody, [place]    Other Instructions

## 2021-10-18 NOTE — Progress Notes (Signed)
Cardiology Office Note:    Date:  10/18/2021   ID:  Katie [name], DOB 1945/08/31, MRN [id number]  PCP:  [name], MD  Cardiologist:  Kate Sable, MD  Electrophysiologist:  None   Referring MD: [name], MD   Chief Complaint  Patient presents with   Other    12 month follow up -- Meds reviewed verbally with patient.     History of Present Illness:    Katie [name] is a 76 y.o. female with a hx of CAD ( chest CT 11/2019-calcified plaque in [facility]), hypertension, hyperlipidemia, diabetes, breast cancer (s/p bilateral mastectomy, chemo, radiation and immunotherapy) who presents for follow-up.   Patient is being seen for CAD and hyperlipidemia.  Tolerating current doses of Lipitor and fenofibrate.  States feeling well, denies chest pain or shortness of breath.  Takes medications as prescribed.  Had a repeat lipid panel obtained showing improved triglycerides levels.   Prior notes Echo 08/2021 EF 60 to 65%. Lexiscan Myoview 06/2020 no evidence for ischemia, low risk study patient s/p chemotherapy for breast cancer.  She had a chest CT to evaluate for the presence of any residual disease. Chest CT obtained on 12/08/2019 showed calcified plaque in the left main, LAD and RCA. echocardiogram 08/2019  showed normal systolic function, EF 60 to 65%.  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, small, not treating-watching    Blood transfusion without reported diagnosis    Cancer ([place])    breast 08/2018   Coronary artery disease    Diverticulitis    DJD (degenerative joint disease)    DM type 2 (diabetes mellitus, type 2) ([place])    Family history of breast cancer    Fibrocystic breast disease    GERD (gastroesophageal reflux disease)    History of chicken pox    Hypercholesteremia    Hypertension    Lichen planopilaris    Urinary incontinence    Vertigo 12/2019   1 episode    Past Surgical History:  Procedure  Laterality Date   BREAST IMPLANT REMOVAL Bilateral 03/08/2019   Procedure: Removal of Bilateral Breast Implants;  Surgeon: Rolm Bookbinder, MD;  Location: [place];  Service: General;  Laterality: Bilateral;   BREAST SURGERY  02/1975   Left Breast Biopsy-Fibrocystic Disease   BREAST SURGERY  10/1981   Bialteral Capsulectomy Breast Surgery   BREAST SURGERY  07/1998   Replace Bilateral Silicone Implants   BREAST SURGERY  01/1976   [place] breast surgery to remove fibrocystic tissue and replace with silicone implants   CATARACT EXTRACTION W/PHACO Right 02/08/2020   Procedure: CATARACT EXTRACTION PHACO AND INTRAOCULAR LENS PLACEMENT ([place]) RIGHT DIABETIC;  Surgeon: Birder [name], MD;  Location: [place];  Service: Ophthalmology;  Laterality: Right;  3.55 0:33.7   CATARACT EXTRACTION W/PHACO Left 02/29/2020   Procedure: CATARACT EXTRACTION PHACO AND INTRAOCULAR LENS PLACEMENT ([facility]) LEFT DIABETIC 2.93  00:27.1;  Surgeon: Birder [name], MD;  Location: [place];  Service: Ophthalmology;  Laterality: Left;  Diabetic - oral meds   CHOLECYSTECTOMY N/A 07/08/2016   Procedure: LAPAROSCOPIC CHOLECYSTECTOMY;  Surgeon: [name], MD;  Location: [facility];  Service: General;  Laterality: N/A;   COLONOSCOPY WITH PROPOFOL N/A 11/07/2017   Procedure: COLONOSCOPY WITH PROPOFOL;  Surgeon: Jonathon Bellows, MD;  Location: [facility];  Service: Gastroenterology;  Laterality: N/A;   DILATION AND CURETTAGE OF UTERUS  01/1994   ESOPHAGOGASTRODUODENOSCOPY (  EGD) WITH PROPOFOL N/A 12/05/2016   Procedure: ESOPHAGOGASTRODUODENOSCOPY (EGD) WITH PROPOFOL;  Surgeon: Jonathon Bellows, MD;  Location: [facility];  Service: Endoscopy;  Laterality: N/A;   FINGER SURGERY  2001 & 2003   Trigger Finger   FOOT SURGERY  1980   To Relieve Pinched Nerve   FOOT SURGERY  07/2008   Left Foot-Plantar Fasciitis   JOINT REPLACEMENT  04/19/2013   Hip Replacement-Right   MASTECTOMY W/ SENTINEL NODE BIOPSY Bilateral  03/08/2019   Procedure: BILATERAL MASTECTOMIES WITH LEFT AXILLARY SENTINEL LYMPH NODE BIOPSY AND BLUE DYE INJECTION;  Surgeon: Rolm Bookbinder, MD;  Location: [place];  Service: General;  Laterality: Bilateral;   PARTIAL HIP ARTHROPLASTY Right    PLACEMENT OF BREAST IMPLANTS  12/26/2004   Replace Failed Implants   REPLACEMENT TOTAL KNEE Left    2019-   RHINOPLASTY  03/1964   To Correct Deviated Septum    Current Medications: Current Meds  Medication Sig   alendronate (FOSAMAX) 70 MG tablet TAKE 1 TABLET(70 MG) BY MOUTH 1 TIME A WEEK WITH A FULL GLASS OF WATER ON AN EMPTY STOMACH   anastrozole (ARIMIDEX) 1 MG tablet TAKE 1 TABLET(1 MG) BY MOUTH DAILY   aspirin 81 MG EC tablet Take 81 mg by mouth daily. Swallow whole.   atorvastatin (LIPITOR) 20 MG tablet Take 1 tablet (20 mg total) by mouth daily.   budesonide (PULMICORT FLEXHALER) 180 MCG/ACT inhaler Inhale 1 puff into the lungs 2 (two) times daily.   Calcium Carbonate-Vit D-Min (CALTRATE 600+D PLUS MINERALS) 600-800 MG-UNIT TABS Take 1 tablet by mouth in the morning and at bedtime.   clobetasol (TEMOVATE) 0.05 % external solution Apply 1 application topically 2 (two) times daily as needed (scalp). Reported on 08/16/2015   Dulaglutide (TRULICITY) 1.5 AS/5.0NL SOPN Inject 1.5 mg into the skin once a week.   fenofibrate (TRICOR) 145 MG tablet Take 1 tablet (145 mg total) by mouth daily.   fluticasone (FLONASE) 50 MCG/ACT nasal spray SHAKE LIQUID AND USE 2 SPRAYS IN EACH NOSTRIL EVERY DAY   gabapentin (NEURONTIN) 300 MG capsule TAKE 1 CAPSULE(300 MG) BY MOUTH AT BEDTIME   glucose blood (FREESTYLE TEST STRIPS) test strip USE TO CHECK BLOOD SUGAR DAILY   loratadine (CLARITIN) 10 MG tablet Take 10 mg by mouth daily.   losartan-hydrochlorothiazide (HYZAAR) 100-25 MG tablet TAKE 1 TABLET BY MOUTH DAILY   Multiple Vitamins-Minerals (CENTRUM SILVER 50+WOMEN) TABS Take 1 tablet by mouth daily.   Polyethyl Glycol-Propyl Glycol (SYSTANE OP) Apply to  eye.   valACYclovir (VALTREX) 500 MG tablet TAKE 1 TABLET(500 MG) BY MOUTH TWICE DAILY     Allergies:   Erythromycin, Ciprofloxacin, Daypro [oxaprozin], Enalapril maleate, Nsaids, and Vioxx [rofecoxib]   Social History   Socioeconomic History   Marital status: Married    Spouse name: Not on file   Number of children: 1   Years of education: Not on file   Highest education level: Not on file  Occupational History   Occupation: Retired  Tobacco Use   Smoking status: Never   Smokeless tobacco: Never  Vaping Use   Vaping Use: Never used  Substance and Sexual Activity   Alcohol use: Not Currently    Alcohol/week: 0.0 standard drinks    Comment: rarely   Drug use: No   Sexual activity: Not Currently    Partners: Male  Other Topics Concern   Not on file  Social History Narrative   Married for 37 years.    She has one child  and one stepston.      Social Determinants of Radio broadcast assistant Strain: Not on file  Food Insecurity: Not on file  Transportation Needs: Not on file  Physical Activity: Not on file  Stress: Not on file  Social Connections: Not on file     Family History: The patient's family history includes Arthritis in her mother; Breast cancer (age of onset: 63) in her sister; Coronary artery disease in her brother; Diabetes in her father; Heart attack in her brother and father; Heart disease in her father; Hypertension in her brother and mother; Leukemia in her brother.  ROS:   Please see the history of present illness.     All other systems reviewed and are negative.  EKGs/Labs/Other Studies Reviewed:    The following studies were reviewed today:   EKG:  EKG is ordered today.  EKG shows normal sinus rhythm  Recent Labs: 06/25/2021: Hemoglobin 11.0; Platelets 237.0 08/23/2021: ALT 28; BUN 21; Creatinine, Ser 1.25; Potassium 3.7; Sodium 136  Recent Lipid Panel    Component Value Date/Time   CHOL 147 10/11/2021 0956   CHOL 169 06/23/2020 0813    TRIG 129 10/11/2021 0956   HDL 46 10/11/2021 0956   HDL 37 (L) 06/23/2020 0813   CHOLHDL 3.2 10/11/2021 0956   VLDL 26 10/11/2021 0956   LDLCALC 75 10/11/2021 0956   LDLCALC 85 06/23/2020 0813   LDLDIRECT 57.0 05/02/2020 0736    Physical Exam:    VS:  BP 134/62 (BP Location: Right Arm, Patient Position: Sitting, Cuff Size: Normal)    Pulse 74    Ht 5\' 2"  (1.575 m)    Wt 167 lb (75.8 kg)    LMP 08/26/1992 (Approximate)    SpO2 97%    BMI 30.54 kg/m     Wt Readings from Last 3 Encounters:  10/18/21 167 lb (75.8 kg)  09/11/21 170 lb (77.1 kg)  09/04/21 169 lb (76.7 kg)     GEN:  Well nourished, well developed in no acute distress HEENT: Normal NECK: No JVD; No carotid bruits LYMPHATICS: No lymphadenopathy CARDIAC: RRR, no murmurs, rubs, gallops RESPIRATORY:  Clear to auscultation without rales, wheezing or rhonchi  ABDOMEN: Soft, non-tender, non-distended MUSCULOSKELETAL:  No edema; No deformity  SKIN: Warm and dry NEUROLOGIC:  Alert and oriented x 3 PSYCHIATRIC:  Normal affect   ASSESSMENT:    1. Coronary artery disease involving native coronary artery of native heart without angina pectoris   2. Primary hypertension   3. Mixed hyperlipidemia     PLAN:    In order of problems listed above:  CAD, calcified plaque noted in the LM, LAD, RCA on  CT scan.  Echo 08/2021 EF 60 to 65%. Lexiscan Myoview 2021 with no evidence for ischemia.  Denies chest pain.  Continue aspirin 81 mg, Lipitor to 20 mg daily.   Hypertension, BP controlled, continue losartan, HCTZ. Mixed hyperlipidemia, total, LDL, triglycerides controlled.  Continue fenofibrate, Lipitor as prescribed.  Follow-up in 12 months   This note was generated in part or whole with voice recognition software. Voice recognition is usually quite accurate but there are transcription errors that can and very often do occur. I apologize for any typographical errors that were not detected and corrected.  Medication  Adjustments/Labs and Tests Ordered: Current medicines are reviewed at length with the patient today.  Concerns regarding medicines are outlined above.  Orders Placed This Encounter  Procedures   EKG 12-Lead   No orders of the defined  types were placed in this encounter.   Patient Instructions  Medication Instructions:  Your physician recommends that you continue on your current medications as directed. Please refer to the Current Medication list given to you today.  *If you need a refill on your cardiac medications before your next appointment, please call your pharmacy*   Lab Work: None ordered If you have labs (blood work) drawn today and your tests are completely normal, you will receive your results only by: [place] (if you have MyChart) OR A paper copy in the mail If you have any lab test that is abnormal or we need to change your treatment, we will call you to review the results.   Testing/Procedures: None ordered   Follow-Up: At [facility], you and your health needs are our priority.  As part of our continuing mission to provide you with exceptional heart care, we have created designated Provider Care Teams.  These Care Teams include your primary Cardiologist (physician) and Advanced Practice Providers (APPs -  Physician Assistants and Nurse Practitioners) who all work together to provide you with the care you need, when you need it.  We recommend signing up for the patient portal called "MyChart".  Sign up information is provided on this After Visit Summary.  MyChart is used to connect with patients for Virtual Visits (Telemedicine).  Patients are able to view lab/test results, encounter notes, upcoming appointments, etc.  Non-urgent messages can be sent to your provider as well.   To learn more about what you can do with MyChart, go to [email or web address].    Your next appointment:   1 year(s)  The format for your next appointment:   In  Person  Provider:   You may see Kate Sable, MD or one of the following Advanced Practice Providers on your designated Care Team:   Murray Hodgkins, NP Christell Faith, PA-C Cadence Kathlen Mody, [place]    Other Instructions     Signed, Kate Sable, MD  10/18/2021 10:50 AM    [place]

## 2021-10-26 ENCOUNTER — Ambulatory Visit (INDEPENDENT_AMBULATORY_CARE_PROVIDER_SITE_OTHER): Payer: Medicare Other | Admitting: Internal Medicine

## 2021-10-26 ENCOUNTER — Encounter: Payer: Self-pay | Admitting: Internal Medicine

## 2021-10-26 VITALS — BP 120/64 | HR 68 | Ht 62.0 in | Wt 167.1 lb

## 2021-10-26 DIAGNOSIS — Z23 Encounter for immunization: Secondary | ICD-10-CM

## 2021-10-26 DIAGNOSIS — K219 Gastro-esophageal reflux disease without esophagitis: Secondary | ICD-10-CM

## 2021-10-26 DIAGNOSIS — K760 Fatty (change of) liver, not elsewhere classified: Secondary | ICD-10-CM

## 2021-10-26 DIAGNOSIS — K2990 Gastroduodenitis, unspecified, without bleeding: Secondary | ICD-10-CM

## 2021-10-26 DIAGNOSIS — K2970 Gastritis, unspecified, without bleeding: Secondary | ICD-10-CM

## 2021-10-26 DIAGNOSIS — K5900 Constipation, unspecified: Secondary | ICD-10-CM

## 2021-10-26 DIAGNOSIS — R140 Abdominal distension (gaseous): Secondary | ICD-10-CM | POA: Diagnosis not present

## 2021-10-26 MED ORDER — OMEPRAZOLE 20 MG PO CPDR: 20.0000 mg | DELAYED_RELEASE_CAPSULE | Freq: Every day | ORAL | 3 refills | Status: DC

## 2021-10-26 NOTE — Progress Notes (Addendum)
Chief Complaint: RUQ ab pain  HPI : 76 year old with history of DM, GERD, CAD, breast cancer s/p surgery/chemo/radiation, asthma, PUD, CAD presents for follow up of RUQ ab pain  Interval History: Her ab pain with epigastric and RUQ ab pain have now resolved with MiraLAX therapy. She does still have some bloating issues while taking the Miralax, though she thinks this also may be potentially food related or due to her CPAP use. She is having BMs about 2-3 times per day, which is increased from 1 time per day prior to starting MiraLAX.  She has been trying to lose weight but would appreciate more guidance.  She has been having some issues with acid reflux.  Wt Readings from Last 3 Encounters:  10/26/21 167 lb 2 oz (75.8 kg)  10/18/21 167 lb (75.8 kg)  09/11/21 170 lb (77.1 kg)   Current Outpatient Medications  Medication Sig Dispense Refill   alendronate (FOSAMAX) 70 MG tablet TAKE 1 TABLET(70 MG) BY MOUTH 1 TIME A WEEK WITH A FULL GLASS OF WATER ON AN EMPTY STOMACH 12 tablet 3   anastrozole (ARIMIDEX) 1 MG tablet TAKE 1 TABLET(1 MG) BY MOUTH DAILY 90 tablet 3   aspirin 81 MG EC tablet Take 81 mg by mouth daily. Swallow whole.     atorvastatin (LIPITOR) 20 MG tablet Take 1 tablet (20 mg total) by mouth daily. 90 tablet 3   budesonide (PULMICORT FLEXHALER) 180 MCG/ACT inhaler Inhale 1 puff into the lungs 2 (two) times daily. 1 each 11   Calcium Carbonate-Vit D-Min (CALTRATE 600+D PLUS MINERALS) 600-800 MG-UNIT TABS Take 1 tablet by mouth in the morning and at bedtime.     clobetasol (TEMOVATE) 0.05 % external solution Apply 1 application topically 2 (two) times daily as needed (scalp). Reported on 08/16/2015     Dulaglutide (TRULICITY) 1.5 NW/2.9FA SOPN Inject 1.5 mg into the skin once a week. 3 mL 11   fenofibrate (TRICOR) 145 MG tablet Take 1 tablet (145 mg total) by mouth daily. 90 tablet 3   fluticasone (FLONASE) 50 MCG/ACT nasal spray SHAKE LIQUID AND USE 2 SPRAYS IN EACH NOSTRIL EVERY  DAY 16 g 2   gabapentin (NEURONTIN) 300 MG capsule TAKE 1 CAPSULE(300 MG) BY MOUTH AT BEDTIME 90 capsule 3   glucose blood (FREESTYLE TEST STRIPS) test strip USE TO CHECK BLOOD SUGAR DAILY 50 strip 11   loratadine (CLARITIN) 10 MG tablet Take 10 mg by mouth daily.     losartan-hydrochlorothiazide (HYZAAR) 100-25 MG tablet TAKE 1 TABLET BY MOUTH DAILY 90 tablet 3   Multiple Vitamins-Minerals (CENTRUM SILVER 50+WOMEN) TABS Take 1 tablet by mouth daily.     Polyethyl Glycol-Propyl Glycol (SYSTANE OP) Apply to eye.     valACYclovir (VALTREX) 500 MG tablet TAKE 1 TABLET(500 MG) BY MOUTH TWICE DAILY 6 tablet 2   No current facility-administered medications for this visit.   Review of Systems: All systems reviewed and negative except where noted in HPI.   Physical Exam: BP 120/64    Pulse 68    Ht 5\' 2"  (1.575 m)    Wt 167 lb 2 oz (75.8 kg)    LMP 08/26/1992 (Approximate)    BMI 30.57 kg/m  Constitutional: Pleasant,well-developed, female in no acute distress. HEENT: Normocephalic and atraumatic. Conjunctivae are normal. No scleral icterus. Cardiovascular: Normal rate, regular rhythm.  Pulmonary/chest: Effort normal and breath sounds normal. No wheezing, rales or rhonchi. Abdominal: Soft, nondistended, tender in the RUQ and epigastric areas Extremities: No edema  Neurological: Alert and oriented to person place and time. Skin: Skin is warm and dry. No rashes noted. Psychiatric: Normal mood and affect. Behavior is normal.  Labs 02/2021: Vit B12 nml  Labs 04/2021: CMP with nml LFTs.   Labs 05/2021: CBC with dec WBC of 3.2, mild anemia with Hb of 11, platelets 237. Ferritin 527, iron/TIBC ratio is 103/527  MRCP 06/08/19: IMPRESSION: 1. No CT findings to explain the patient's history of pain. 2. Status post cholecystectomy. No intra or extrahepatic biliary duct dilatation. No choledocholithiasis.  CT A/P w/contrast 12/08/19: IMPRESSION: 1. Status post bilateral modified radical mastectomy  with probable seromas on the left chest wall in the mastectomy site and in the left axillary region, as above. No definite findings to suggest metastatic disease in the chest, abdomen or pelvis. 2. Postradiation changes in the subpleural aspect of the left upper lobe deep to the left breast. 3. Scattered areas of cylindrical bronchiectasis in the lungs bilaterally with some chronic mucoid impaction within the medial right lower lobe. 4. Aortic atherosclerosis, in addition to left main and 2 vessel coronary artery disease. Assessment for potential risk factor modification, dietary therapy or pharmacologic therapy may be warranted, if clinically indicated. 5. Colonic diverticulosis without evidence of acute diverticulitis at this time. 6. Additional incidental findings, as above.  CT A/P w/contrast 08/09/21: IMPRESSION: 1. Mild diffuse fatty infiltration of liver. 2. Stable hypodense lesion in the inferior right lobe of the liver favored as small cyst. No new liver lesions. 3. Colonic diverticulosis without evidence for acute diverticulitis. 4. Unchanged likely postoperative seroma anterior left chest wall.  Gastric emptying study 10/10/21: IMPRESSION: Normal gastric emptying study.  EGD 12/05/16: - Normal examined duodenum. - Normal esophagus. - Gastritis. Biopsied. - A single gastric polyp. Resected and retrieved. Path: DIAGNOSIS:  A. STOMACH POLYP, FUNDUS; COLD BIOPSY:  - FEATURES CONSISTENT WITH A BENIGN XANTHOMA, SEE NOTE.  - NEGATIVE FOR H. PYLORI, DYSPLASIA AND MALIGNANCY.  B.  STOMACH, RANDOM; COLD BIOPSY:  - MINIMAL TO MILD CHRONIC GASTRITIS.  - NEGATIVE FOR H. PYLORI, DYSPLASIA AND MALIGNANCY.  Note: PAS and cytokeratin immunostains are negative with appropriate  controls.   Colonoscopy 11/07/17: - Diverticulosis in the sigmoid colon. - Two 4 to 6 mm polyps in the ascending colon, removed with a cold snare. Resected and retrieved. - One 5 mm polyp in the sigmoid  colon, removed with a cold snare. Resected and retrieved. - The examination was otherwise normal on direct and retroflexion views. Path: A. COLON POLYP X2, ASCENDING; COLD SNARE:  - TUBULAR ADENOMA (1).  - POLYPOID FRAGMENT OF MILDLY INFLAMED COLONIC MUCOSA (1).  - NEGATIVE FOR HIGH GRADE DYSPLASIA AND MALIGNANCY.  B.  COLON POLYP, SIGMOID; COLD SNARE:  - TUBULAR ADENOMA.  - NEGATIVE FOR HIGH GRADE DYSPLASIA AND MALIGNANCY.   EGD 09/11/21: - Normal esophagus. - A medium amount of food (residue) in the stomach. - Normal examined duodenum. Biopsied. - Biopsies were taken with a cold forceps for histology on the greater curvature of the gastric body, on the lesser curvature of the gastric body, at the incisura, on the greater curvature of the gastric antrum and on the lesser curvature of the gastric antrum. Path: 1. Surgical [P], duodenal FRAGMENTS OF DUODENAL MUCOSA WITH NORMAL VILLOUS AND CRYPT ARCHITECTURE. MILD INFLAMMATION IS SEEN BUT NO INCREASE IN INTRAEPITHELIAL LYMPHOCYTES IS NOTED. NO DYSPLASIA OR MALIGNANCY IS SEEN. NO HISTOLOGIC EVIDENCE OF CELIAC DISEASE IS SEEN. 2. Surgical [P], gastric MILD CHRONIC GASTRITIS. NO SIGNIFICANT ACTIVITY, INTESTINAL METAPLASIA,  DYSPLASIA, H. PYLORI-LIKE ORGANISMS OR MALIGNANCY IS SEEN ON H&E STAIN. CLINICAL AND ENDOSCOPIC CORRELATION IS REQUIRED.  ASSESSMENT AND PLAN: Constipation Bloating Gastritis Fatty liver disease History of colon polyps GERD On MiraLAX therapy, patient has had improvement in her abdominal pain symptoms.  This suggest that her previously noted abdominal pain was likely due to constipation and/or IBS.  Patient has continued to have some issues with bloating so we will give her a low FODMAP diet handout to see if adjusting her diet will improve some of her bloating symptoms.  For her fatty liver, I encouraged her to continue to try to lose weight.  We will refer to nutrition for some extra guidance on how to facilitate  weight loss through dietary changes.  I also encouraged her to drink coffee since this has been shown to reduce liver fibrosis progression.  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  She does still need hepatitis B vaccination based upon her negative hepatitis B surface antibody so we will get her started on a series today.  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  Patient does not have a diagnosis of hemochromatosis based upon the panel results.  Patient also describes some issues with acid reflux so we will start her on omeprazole 20 mg daily, which will also help with her previously noted gastritis. - Low FODMAP diet  - Encourage weight loss - Nutrition consult to help with weight loss - Encourage coffee intake - Will start Hep B vaccination - Start omeprazole 20 mg QD, take 30 min before dinner - Patient is due for repeat colonoscopy in 10/2022 based upon recommendations from her previous GI physician - RTC 4 months  [name], MD  I spent 42 minutes of time, including in depth chart review, independent review of results as outlined above, communicating results with the patient directly, face-to-face time with the patient, coordinating care, ordering studies and medications as appropriate, and documentation.

## 2021-10-26 NOTE — Patient Instructions (Addendum)
If you are age 77 or older, your body mass index should be between 23-30. Your Body mass index is 30.57 kg/m?[name] If this is out of the aforementioned range listed, please consider follow up with your Primary Care Provider. ? ?If you are age 73 or younger, your body mass index should be between 19-25. Your Body mass index is 30.57 kg/m?[name] If this is out of the aformentioned range listed, please consider follow up with your Primary Care Provider.  ? ?We have sent the following medications to your pharmacy for you to pick up at your convenience:Omeprazole 20 mg 30 minutes before dinner.  ? ?The [facility] providers would like to encourage you to use [facility] to communicate with providers for non-urgent requests or questions.  Due to long hold times on the telephone, sending your provider a message by [facility] may be a faster and more efficient way to get a response.  Please allow 48 business hours for a response.  Please remember that this is for non-urgent requests.  ? ?It was a pleasure to see you today! ? ?Thank you for trusting me with your gastrointestinal care!   ? ?[name], MD  ?  ?

## 2021-10-31 DIAGNOSIS — M65321 Trigger finger, right index finger: Secondary | ICD-10-CM | POA: Diagnosis not present

## 2021-11-01 DIAGNOSIS — H26492 Other secondary cataract, left eye: Secondary | ICD-10-CM | POA: Diagnosis not present

## 2021-11-06 ENCOUNTER — Other Ambulatory Visit: Payer: Self-pay | Admitting: Family Medicine

## 2021-11-06 ENCOUNTER — Other Ambulatory Visit: Payer: Self-pay | Admitting: *Deleted

## 2021-11-06 MED ORDER — ATORVASTATIN CALCIUM 20 MG PO TABS: 20.0000 mg | ORAL_TABLET | Freq: Every day | ORAL | 3 refills | Status: DC

## 2021-11-14 DIAGNOSIS — M65321 Trigger finger, right index finger: Secondary | ICD-10-CM | POA: Diagnosis not present

## 2021-11-23 ENCOUNTER — Encounter: Payer: Medicare Other | Attending: Internal Medicine | Admitting: Dietician

## 2021-11-23 ENCOUNTER — Encounter: Payer: Self-pay | Admitting: Dietician

## 2021-11-23 VITALS — Ht 62.0 in | Wt 168.8 lb

## 2021-11-23 DIAGNOSIS — K2970 Gastritis, unspecified, without bleeding: Secondary | ICD-10-CM | POA: Insufficient documentation

## 2021-11-23 DIAGNOSIS — K2990 Gastroduodenitis, unspecified, without bleeding: Secondary | ICD-10-CM | POA: Insufficient documentation

## 2021-11-23 DIAGNOSIS — K760 Fatty (change of) liver, not elsewhere classified: Secondary | ICD-10-CM | POA: Insufficient documentation

## 2021-11-23 DIAGNOSIS — R140 Abdominal distension (gaseous): Secondary | ICD-10-CM | POA: Insufficient documentation

## 2021-11-23 DIAGNOSIS — K5900 Constipation, unspecified: Secondary | ICD-10-CM | POA: Insufficient documentation

## 2021-11-23 DIAGNOSIS — Z713 Dietary counseling and surveillance: Secondary | ICD-10-CM | POA: Insufficient documentation

## 2021-11-23 DIAGNOSIS — E119 Type 2 diabetes mellitus without complications: Secondary | ICD-10-CM | POA: Insufficient documentation

## 2021-11-23 DIAGNOSIS — K219 Gastro-esophageal reflux disease without esophagitis: Secondary | ICD-10-CM | POA: Insufficient documentation

## 2021-11-23 DIAGNOSIS — E113293 Type 2 diabetes mellitus with mild nonproliferative diabetic retinopathy without macular edema, bilateral: Secondary | ICD-10-CM

## 2021-11-23 NOTE — Patient Instructions (Addendum)
Limit foods that can worsen constipation: bananas, applesauce, breads, cheese, pasta, creamy peanut butter, other starchy foods (unless high in fiber) ?Try a small glass of juice that could help with constipation: apple, grape, or prune juice ?Foods that are gas-forming: asparagus, beans, broccoli, brussels sprouts, cauliflower, cabbage, onions, carbonated drinks. ?Continue to concentrate on eating plenty of vegetables and fruits for fiber and low calories. ?Limit fried foods at restaurants.  ?Choose whole grain foods, high fiber foods such as eating skins and peels on produce, small portions of seeds, nuts.  ?Try Lose It, or Calorie [employer] as apps to count calories.  ?

## 2021-11-23 NOTE — Progress Notes (Signed)
Medical Nutrition Therapy: Visit start time: 1330  end time: 1430  ?Assessment:  Diagnosis: T2DM, NAFLD, constipation, gastritis and gastroduodenitis, abdominal bloating, GERD ?Past medical history: HTN, hyperlipidemia, diverticulosis, sleep apnea, cerebral aneurysm, CAD, osteopenia ?Psychosocial issues/ stress concerns: none ? ?Preferred learning method:  ?Auditory ?Visual ? ? ?Current weight: 168.8lbs Height: 5'2"  BMI: 30.87 ? ?Medications, supplements: reconciled list in medical record ? ?Progress and evaluation:  ?Patient reports her main GI symptoms are bloating and constipation, upper right quadrant pain. Some gastric and duodenal inflammation was detected during scans. She has had severe gastritis in the past, current episode is more mild. ?She voices concern about fatty liver and keeping control over diabetes.  ?Recent HbA1C 6.6% 08/23/21 ?She has been increasing vegetables and fruits in her diet, and reducing fried foods. She reports sometimes including sweets and sweet snacks, but tries to limit. ?She has noticed that some foods do increase bloating such as broccoli, cauliflower, cabbage  ? ?Physical activity: treadmill, NuStep, recumbant bike 60 minutes, 3-5 times per week ? ?Dietary Intake:  ?Usual eating pattern includes 3 meals and 2 snacks per day. ?Dining out frequency: 4-6 meals per week. ? ?Breakfast: cereal meuslix or cheerios; egg/ egg sandwich home or out ?Snack: same as pm ?Lunch: salad with protein ie cheese/ almonds/ lunch meat + crackers; sandwich ie pimento cheese ?Snack: occ popcorn; fruit 1-2 times daily ?Supper: limits fried foods other than occ at restaurants; veg regularly ?Snack: none ?Beverages: water 30-40oz daily, sugar free beverages ie diet coke, decaf tea with sweetener, decaf coffee ? ?Intervention:  ? ?Nutrition Care Education: ?  ?Basic nutrition: basic food groups; appropriate nutrient balance; appropriate meal and snack schedule; general nutrition guidelines    ?Weight  control: importance of low sugar and low fat choices; portion control; estimated energy needs for weight loss at 1300 kcal, provided guidance for 45% CHO, 25% pro, 30% fat ?Diabetes: appropriate meal and snack schedule, appropriate carb intake and balance, healthy carb choices, role of fiber, protein, fat ?Fatty Liver: controlled carb intake and limiting added sugars and processed carbs; choosing low fat foods; role of weight control ?Gastritis/ Gastroduodenitis/ bloating/ GERD: small frequent meals and snacks; avoiding spicy, high fat foods ?Constipation: fiber intake, importance of adequate fluid intake, role of exercise ? ? ?Nutritional Diagnosis:  [place]-2.2 Altered nutrition-related laboratory As related to Type 2 diabetes.  As evidenced by elevated HbA1C. ?[place]-1.4 Altered GI function As related to gastritis, gastroduodenitis, abdominal bloating, GERD, constipation.  As evidenced by patient reported symptoms and medical history. ?[place]-3.3 Overweight/obesity As related to history of excess calories, weight regain after cancer treatment.  As evidenced by fatty liver, BMI of 30.87. ? ? ?Education Materials given:  ?Plate Planner with food lists, sample meal pattern ?Constipation Nutrition Therapy ?Visit summary with goals/ instructions ? ? ?Learner/ who was taught:  ?Patient  ? ? ?Level of understanding: ?Verbalizes/ demonstrates competency ? ?Demonstrated degree of understanding via:   Teach back ?Learning barriers: ?None ? ?Willingness to learn/ readiness for change: ?Eager, change in progress ? ? ?Monitoring and Evaluation:  Dietary intake, exercise, GI symptoms, BG control, and body weight ?     follow up:  01/07/22 at 9:30am   ?

## 2021-11-27 ENCOUNTER — Ambulatory Visit (INDEPENDENT_AMBULATORY_CARE_PROVIDER_SITE_OTHER): Payer: Medicare Other | Admitting: Family Medicine

## 2021-11-27 ENCOUNTER — Encounter: Payer: Self-pay | Admitting: Family Medicine

## 2021-11-27 VITALS — BP 144/60 | HR 78 | Temp 97.8°F | Ht 62.0 in | Wt 168.2 lb

## 2021-11-27 DIAGNOSIS — R7989 Other specified abnormal findings of blood chemistry: Secondary | ICD-10-CM

## 2021-11-27 DIAGNOSIS — I152 Hypertension secondary to endocrine disorders: Secondary | ICD-10-CM

## 2021-11-27 DIAGNOSIS — I2510 Atherosclerotic heart disease of native coronary artery without angina pectoris: Secondary | ICD-10-CM

## 2021-11-27 DIAGNOSIS — E1159 Type 2 diabetes mellitus with other circulatory complications: Secondary | ICD-10-CM

## 2021-11-27 DIAGNOSIS — D509 Iron deficiency anemia, unspecified: Secondary | ICD-10-CM

## 2021-11-27 DIAGNOSIS — R1011 Right upper quadrant pain: Secondary | ICD-10-CM | POA: Diagnosis not present

## 2021-11-27 DIAGNOSIS — E113293 Type 2 diabetes mellitus with mild nonproliferative diabetic retinopathy without macular edema, bilateral: Secondary | ICD-10-CM

## 2021-11-27 LAB — POCT GLYCOSYLATED HEMOGLOBIN (HGB A1C): Hemoglobin A1C: 6.2 % — AB (ref 4.0–5.6)

## 2021-11-27 LAB — CBC
HCT: 32 % — ABNORMAL LOW (ref 36.0–46.0)
Hemoglobin: 11.1 g/dL — ABNORMAL LOW (ref 12.0–15.0)
MCHC: 34.7 g/dL (ref 30.0–36.0)
MCV: 100 fl (ref 78.0–100.0)
Platelets: 238 10*3/uL (ref 150.0–400.0)
RBC: 3.2 Mil/uL — ABNORMAL LOW (ref 3.87–5.11)
RDW: 14.9 % (ref 11.5–15.5)
WBC: 4.1 10*3/uL (ref 4.0–10.5)

## 2021-11-27 NOTE — Progress Notes (Signed)
? ? Patient ID: Katie [name], female    DOB: 07/04/1946, 76 y.o.   MRN: [id number] ? ?This visit was conducted in person. ? ?BP (!) 144/60   Pulse 78   Temp 97.8 ?F (36.6 ?C) (Oral)   Ht '5\' 2"'$  (1.575 m)   Wt 168 lb 3 oz (76.3 kg)   LMP 08/26/1992 (Approximate)   SpO2 96%   BMI 30.76 kg/m?   ? ?CC:  ?Chief Complaint  ?Patient presents with  ? Diabetes  ? ? ? ?Subjective:  ? ?HPI: ?Katie [name] is a 76 y.o. female presenting on 11/27/2021 for Diabetes ? ? Dr. Lorenso Courier, GI, is evaluating her  for elevated ferritin. ?Genetic testing showed no evidence of hemochromatosis... only had one gene. ?She has started the hep B series given it looke like she did not have immunity. ? EGD showed:  gastritis... on PPI.. prilosec prn. ?Started on miralax... RUQ pain resolved. ? She remains bloated. ? ?Diabetes:   On Trulicity 1.5 mg weekly. She feels it may be contributing to bloating.. ? She has started seeing  nutrition ?Started 1300 calorie diet. ?Lab Results  ?Component Value Date  ? HGBA1C 6.2 (A) 11/27/2021  ?Using medications without difficulties: ?Hypoglycemic episodes: ?Hyperglycemic episodes: ?Feet problems: ? ?Wt Readings from Last 3 Encounters:  ?11/27/21 168 lb 3 oz (76.3 kg)  ?11/23/21 168 lb 12.8 oz (76.6 kg)  ?10/26/21 167 lb 2 oz (75.8 kg)  ? ?Hypertension:    Not at goal  today in office on losartan HCTZ ? Had stress this AM ?BP Readings from Last 3 Encounters:  ?11/27/21 (!) 144/60  ?10/26/21 120/64  ?10/18/21 134/62  ?Using medication without problems or lightheadedness:  none ?Chest pain with exertion: none ?Edema: none ?Short of breath: none ?Average home BPs: 125/60 ?Other issues: ? ?Blood Sugars averaging: ?eye exam within last year: ? ? ?   ? ?Relevant past medical, surgical, family and social history reviewed and updated as indicated. Interim medical history since our last visit reviewed. ?Allergies and medications reviewed and updated. ?Outpatient Medications Prior to Visit  ?Medication Sig Dispense  Refill  ? alendronate (FOSAMAX) 70 MG tablet TAKE 1 TABLET(70 MG) BY MOUTH 1 TIME A WEEK WITH A FULL GLASS OF WATER ON AN EMPTY STOMACH 12 tablet 3  ? anastrozole (ARIMIDEX) 1 MG tablet TAKE 1 TABLET(1 MG) BY MOUTH DAILY 90 tablet 3  ? aspirin 81 MG EC tablet Take 81 mg by mouth daily. Swallow whole.    ? atorvastatin (LIPITOR) 20 MG tablet Take 1 tablet (20 mg total) by mouth daily. 90 tablet 3  ? budesonide (PULMICORT FLEXHALER) 180 MCG/ACT inhaler Inhale 1 puff into the lungs 2 (two) times daily. 1 each 11  ? Calcium Carbonate-Vit D-Min (CALTRATE 600+D PLUS MINERALS) 600-800 MG-UNIT TABS Take 1 tablet by mouth in the morning and at bedtime.    ? clobetasol (TEMOVATE) 0.05 % external solution Apply 1 application topically 2 (two) times daily as needed (scalp). Reported on 08/16/2015    ? Dulaglutide (TRULICITY) 1.5 KW/4.0XB SOPN Inject 1.5 mg into the skin once a week. 3 mL 11  ? fenofibrate (TRICOR) 145 MG tablet Take 1 tablet (145 mg total) by mouth daily. 90 tablet 3  ? fluticasone (FLONASE) 50 MCG/ACT nasal spray SHAKE LIQUID AND USE 2 SPRAYS IN EACH NOSTRIL EVERY DAY 16 g 2  ? gabapentin (NEURONTIN) 300 MG capsule TAKE 1 CAPSULE(300 MG) BY MOUTH AT BEDTIME 90 capsule 3  ? glucose blood (FREESTYLE TEST  STRIPS) test strip USE TO CHECK BLOOD SUGAR DAILY 50 strip 11  ? loratadine (CLARITIN) 10 MG tablet Take 10 mg by mouth daily.    ? losartan-hydrochlorothiazide (HYZAAR) 100-25 MG tablet TAKE 1 TABLET BY MOUTH DAILY 90 tablet 1  ? Multiple Vitamins-Minerals (CENTRUM SILVER 50+WOMEN) TABS Take 1 tablet by mouth daily.    ? omeprazole (PRILOSEC) 20 MG capsule Take 1 capsule (20 mg total) by mouth daily. Take one tablet 47mns before dinner. 90 capsule 3  ? Polyethyl Glycol-Propyl Glycol (SYSTANE OP) Apply to eye.    ? Probiotic Product (PROBIOTIC DAILY PO) Take by mouth. [employer] Digestive Advantage    ? valACYclovir (VALTREX) 500 MG tablet TAKE 1 TABLET(500 MG) BY MOUTH TWICE DAILY 6 tablet 2  ? ?No  facility-administered medications prior to visit.  ?  ? ?Per HPI unless specifically indicated in ROS section below ?Review of Systems  ?Constitutional:  Negative for fatigue and fever.  ?HENT:  Negative for congestion.   ?Eyes:  Negative for pain.  ?Respiratory:  Negative for cough and shortness of breath.   ?Cardiovascular:  Negative for chest pain, palpitations and leg swelling.  ?Gastrointestinal:  Negative for abdominal pain.  ?Genitourinary:  Negative for dysuria and vaginal bleeding.  ?Musculoskeletal:  Negative for back pain.  ?Neurological:  Negative for syncope, light-headedness and headaches.  ?Psychiatric/Behavioral:  Negative for dysphoric mood.   ?Objective:  ?BP (!) 144/60   Pulse 78   Temp 97.8 ?F (36.6 ?C) (Oral)   Ht '5\' 2"'$  (1.575 m)   Wt 168 lb 3 oz (76.3 kg)   LMP 08/26/1992 (Approximate)   SpO2 96%   BMI 30.76 kg/m?   ?Wt Readings from Last 3 Encounters:  ?11/27/21 168 lb 3 oz (76.3 kg)  ?11/23/21 168 lb 12.8 oz (76.6 kg)  ?10/26/21 167 lb 2 oz (75.8 kg)  ?  ?  ?Physical Exam ?Constitutional:   ?   General: She is not in acute distress. ?   Appearance: Normal appearance. She is well-developed. She is not ill-appearing or toxic-appearing.  ?HENT:  ?   Head: Normocephalic.  ?   Right Ear: Hearing, tympanic membrane, ear canal and external ear normal. Tympanic membrane is not erythematous, retracted or bulging.  ?   Left Ear: Hearing, tympanic membrane, ear canal and external ear normal. Tympanic membrane is not erythematous, retracted or bulging.  ?   Nose: No mucosal edema or rhinorrhea.  ?   Right Sinus: No maxillary sinus tenderness or frontal sinus tenderness.  ?   Left Sinus: No maxillary sinus tenderness or frontal sinus tenderness.  ?   Mouth/Throat:  ?   Pharynx: Uvula midline.  ?Eyes:  ?   General: Lids are normal. Lids are everted, no foreign bodies appreciated.  ?   Conjunctiva/sclera: Conjunctivae normal.  ?   Pupils: Pupils are equal, round, and reactive to light.  ?Neck:  ?    Thyroid: No thyroid mass or thyromegaly.  ?   Vascular: No carotid bruit.  ?   Trachea: Trachea normal.  ?Cardiovascular:  ?   Rate and Rhythm: Normal rate and regular rhythm.  ?   Pulses: Normal pulses.  ?   Heart sounds: Normal heart sounds, S1 normal and S2 normal. No murmur heard. ?  No friction rub. No gallop.  ?Pulmonary:  ?   Effort: Pulmonary effort is normal. No tachypnea or respiratory distress.  ?   Breath sounds: Normal breath sounds. No decreased breath sounds, wheezing, rhonchi or rales.  ?  Abdominal:  ?   General: Bowel sounds are normal.  ?   Palpations: Abdomen is soft.  ?   Tenderness: There is no abdominal tenderness.  ?Musculoskeletal:  ?   Cervical back: Normal range of motion and neck supple.  ?Skin: ?   General: Skin is warm and dry.  ?   Findings: No rash.  ?Neurological:  ?   Mental Status: She is alert.  ?Psychiatric:     ?   Mood and Affect: Mood is not anxious or depressed.     ?   Speech: Speech normal.     ?   Behavior: Behavior normal. Behavior is cooperative.     ?   Thought Content: Thought content normal.     ?   Judgment: Judgment normal.  ? ?   ?Results for orders placed or performed in visit on 11/27/21  ?POCT glycosylated hemoglobin (Hb A1C)  ?Result Value Ref Range  ? Hemoglobin A1C 6.2 (A) 4.0 - 5.6 %  ? HbA1c POC (<> result, manual entry)    ? HbA1c, POC (prediabetic range)    ? HbA1c, POC (controlled diabetic range)    ? ? ?This visit occurred during the SARS-CoV-2 public health emergency.  Safety protocols were in place, including screening questions prior to the visit, additional usage of staff PPE, and extensive cleaning of exam room while observing appropriate contact time as indicated for disinfecting solutions.  ? ?COVID 19 screen:  No recent travel or known exposure to [place] ?The patient denies respiratory symptoms of COVID 19 at this time. ?The importance of social distancing was discussed today.  ? ?Assessment and Plan ? ?  ?Problem List Items Addressed This Visit    ? ? Controlled type 2 diabetes with retinopathy ([place]) - Primary (Chronic)  ?   Well controlled ? ? Stop trulicity given likely cause of bloating. ?Re-eval in 3 months with diet control. ?  ?  ? Relevant Orders  ? POCT glycosyla

## 2021-11-27 NOTE — Assessment & Plan Note (Addendum)
Chronic, poor control today in office but well controlled at home ? ?Conitnue losartan 100 mg/HCTZ 25 mg daily ?

## 2021-11-27 NOTE — Assessment & Plan Note (Signed)
No overt hemochromatosis per GI but she does have one gene for this.. ? Contributing ?Vs acute phase reactant. ? ?No further eval needed. ?

## 2021-11-27 NOTE — Assessment & Plan Note (Addendum)
Well controlled ? ? Stop trulicity given likely cause of bloating. ?Re-eval in 3 months with diet control. ?

## 2021-11-27 NOTE — Patient Instructions (Addendum)
Stop Trulicity given possible cause of bloating. ? Continue working on healthy eating and regular exercise as discussed. ? Please stop at the lab to have labs drawn. ? ? ?

## 2021-11-27 NOTE — Assessment & Plan Note (Signed)
Resolved with PPI prn ? Can use prilosec on limited basis ( as already on arimidex which  Causes bone loss as well) ? ?

## 2021-11-28 ENCOUNTER — Encounter: Payer: Self-pay | Admitting: Family Medicine

## 2021-11-28 ENCOUNTER — Ambulatory Visit (INDEPENDENT_AMBULATORY_CARE_PROVIDER_SITE_OTHER): Payer: Medicare Other | Admitting: Internal Medicine

## 2021-11-28 DIAGNOSIS — Z23 Encounter for immunization: Secondary | ICD-10-CM

## 2021-11-28 NOTE — Progress Notes (Signed)
Pt presents today for nurse visit for an injection. Per Dr. Lorenso Courier, injection of Heplisav given IM in R deltoid today by A. Tadao Emig, LPN. Denies any adverse reactions or discomfort.  ?

## 2021-11-28 NOTE — Patient Instructions (Signed)
Hepatitis B Vaccine, Recombinant injection ?What is this medication? ?HEPATITIS B VACCINE (hep uh TAHY tis  B  VAK seen) is a vaccine. It is used to prevent an infection with the hepatitis B virus. ?This medicine may be used for other purposes; ask your health care provider or pharmacist if you have questions. ?COMMON BRAND NAME(S): Engerix-B, Engerix-B Pediatric, HEPLISAV-B, PreHevbrio, Recombivax HB, Recombivax HB Pediatric/Adolescent ?What should I tell my care team before I take this medication? ?They need to know if you have any of these conditions: ?fever, infection ?heart disease ?hepatitis B infection ?immune system problems ?kidney disease ?an unusual or allergic reaction to vaccines, yeast, other medicines, foods, dyes, or preservatives ?pregnant or trying to get pregnant ?breast-feeding ?How should I use this medication? ?This vaccine is for injection into a muscle. It is given by a health care professional. ?A copy of Vaccine Information Statements will be given before each vaccination. Read this sheet carefully each time. The sheet may change frequently. ?Talk to your pediatrician regarding the use of this medicine in children. While this drug may be prescribed for children as young as newborn for selected conditions, precautions do apply. ?Overdosage: If you think you have taken too much of this medicine contact a poison control center or emergency room at once. ?NOTE: This medicine is only for you. Do not share this medicine with others. ?What if I miss a dose? ?It is important not to miss your dose. Call your doctor or health care professional if you are unable to keep an appointment. ?What may interact with this medication? ?medicines that suppress your immune function like adalimumab, anakinra, infliximab ?medicines to treat cancer ?steroid medicines like prednisone or cortisone ?This list may not describe all possible interactions. Give your health care provider a list of all the medicines, herbs,  non-prescription drugs, or dietary supplements you use. Also tell them if you smoke, drink alcohol, or use illegal drugs. Some items may interact with your medicine. ?What should I watch for while using this medication? ?See your health care provider for all shots of this vaccine as directed. You must have 3 shots of this vaccine for protection from hepatitis B infection. Tell your doctor right away if you have any serious or unusual side effects after getting this vaccine. ?What side effects may I notice from receiving this medication? ?Side effects that you should report to your doctor or health care professional as soon as possible: ?allergic reactions like skin rash, itching or hives, swelling of the face, lips, or tongue ?breathing problems ?confused, irritated ?fast, irregular heartbeat ?flu-like syndrome ?numb, tingling pain ?seizures ?unusually weak or tired ?Side effects that usually do not require medical attention (report to your doctor or health care professional if they continue or are bothersome): ?diarrhea ?fever ?headache ?loss of appetite ?muscle pain ?nausea ?pain, redness, swelling, or irritation at site where injected ?tiredness ?This list may not describe all possible side effects. Call your doctor for medical advice about side effects. You may report side effects to FDA at 1-800-FDA-1088. ?Where should I keep my medication? ?This drug is given in a hospital or clinic and will not be stored at home. ?NOTE: This sheet is a summary. It may not cover all possible information. If you have questions about this medicine, talk to your doctor, pharmacist, or health care provider. ?? 2022 Elsevier/Gold Standard (2013-12-13 00:00:00) ? ?

## 2021-12-03 ENCOUNTER — Ambulatory Visit (INDEPENDENT_AMBULATORY_CARE_PROVIDER_SITE_OTHER): Payer: Medicare Other | Admitting: Pulmonary Disease

## 2021-12-03 ENCOUNTER — Encounter: Payer: Self-pay | Admitting: Family Medicine

## 2021-12-03 ENCOUNTER — Encounter: Payer: Self-pay | Admitting: Pulmonary Disease

## 2021-12-03 VITALS — BP 122/50 | HR 82 | Temp 97.4°F | Ht 62.0 in | Wt 168.6 lb

## 2021-12-03 DIAGNOSIS — G4733 Obstructive sleep apnea (adult) (pediatric): Secondary | ICD-10-CM | POA: Diagnosis not present

## 2021-12-03 DIAGNOSIS — I272 Pulmonary hypertension, unspecified: Secondary | ICD-10-CM

## 2021-12-03 DIAGNOSIS — Z170 Estrogen receptor positive status [ER+]: Secondary | ICD-10-CM

## 2021-12-03 DIAGNOSIS — C50412 Malignant neoplasm of upper-outer quadrant of left female breast: Secondary | ICD-10-CM

## 2021-12-03 DIAGNOSIS — R053 Chronic cough: Secondary | ICD-10-CM

## 2021-12-03 DIAGNOSIS — J683 Other acute and subacute respiratory conditions due to chemicals, gases, fumes and vapors: Secondary | ICD-10-CM

## 2021-12-03 NOTE — Patient Instructions (Signed)
For your CPAP machine all you need is just the adapter plug.  The travel CPAP is rated for 100 to 240 V current. ? ?Increase your Pulmicort to 2 puffs twice a day, make sure you rinse your mouth well after you use it. ? ?We will see him in follow-up in 4 months time call sooner should any new problems arise. ?

## 2021-12-03 NOTE — Progress Notes (Signed)
Subjective:    Patient ID: Katie [name], female    DOB: [birth year], 76 y.o.   MRN: [id number] Patient Care Team: [name], MD as PCP - General (Family Medicine) Debbe Odea, MD as PCP - Cardiology (Cardiology) Galen Manila, MD as Referring Physician (Ophthalmology) Debbrah Alar, MD as Consulting Physician (Dermatology) Ricarda Frame, MD as Consulting Physician (General Surgery) Tobe Sos, MD as Consulting Physician (Dentistry) Glenna Fellows, MD (Inactive) as Consulting Physician (General Surgery) Serena Croissant, MD as Consulting Physician (Hematology and Oncology) Lonie Peak, MD as Attending Physician (Radiation Oncology) Emelia Loron, MD as Consulting Physician (General Surgery)  Chief Complaint  Patient presents with   Follow-up    Doing well with CPAP.  Using Pulmicort, getting used to it.    HPI Katie [name] is a 76 year old lifelong never smoker, with a history of HER-2 positive breast cancer status post bilateral mastectomies, radiation and neoadjuvant chemotherapy.  She was initially seen here on 26 January 2020 for abnormal chest x-ray and for issues with cough that started after initiation of anastrozole.  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  At this point inhaled corticosteroid therapy with Arnuity was started for her cough issue.  She was last seen here on 05 March 2021.  At that time she continued to do well on Arnuity Ellipta.  Her insurance however was not covering Arnuity Ellipta anymore and she was switched to Pulmicort Flexhaler.  She has fair control of her cough with this medication but feels not as good control as on the Arnuity, she is currently on a relatively low-dose.  He was last seen here on 04 September 2022 and at that time was having some difficulties with the CPAP mask for OSA.  She did follow-up instruction to check with the DME company and she has  now been refitted.  She is tolerating CPAP very well. She is currently using CPAP of auto CPAP at 5 to 20 cm H2O of pressure.  She notes that she continues to do well with this.  She feels that she gets restorative sleep.  She does not endorse dyspnea.  Cough continues to be controlled.  We reviewed her compliance report and she has been compliant with CPAP.  Since mask refitting she has not had any difficulties with abdominal bloating.  No leaks.  She has 100% compliance with residual AHI of 1.3 on therapy which is excellent.  Voices no other active complaint today.  Overall she looks well and feels well.  DATA: 09/24/2019 2D echo: LVEF 60 to 65%, normal LV function, indeterminate LV diastolic parameters. Mildly elevated pulmonary artery systolic pressure. 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  Very mild cylindrical bronchiectasis in the lungs bilaterally with some mucus impaction right lower lobe.  Mediastinal adenopathy. 02/17/2020 PFT: Normal spirometry and lung volumes 04/25/2020 Home sleep study: AHI 15.4, lowest O2 sat 76% with average of 93%, moderate obstructive sleep apnea. 08/31/2021 2D echo: LVEF 60 to 65%, grade I DD, mildly elevated pulmonary artery systolic pressure.  In essence, unchanged from prior  Review of Systems A 10 point review of systems was performed and it is as noted above otherwise negative.  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   Urine WBC increased 01/05/2019   Anemia 10/15/2018   Port-A-Cath in place 09/29/2018   Diabetic retinopathy (HCC) 09/29/2018   Genetic testing 09/23/2018   Family history of leukemia 09/16/2018   Family history of breast cancer    Malignant neoplasm of upper-outer quadrant of left breast in female, 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/11/2018   Family history of breast cancer in sister 08/28/2018   Diverticular disease 10/17/2017   Primary osteoarthritis of right knee 03/07/2017   HSV-1 (herpes simplex virus 1) infection, 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   Vitamin D deficiency 10/11/2016   Family history of thyroid disorder 04/04/2015   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   Counseling regarding end of life decision making 10/04/2014   OA (osteoarthritis) of neck 08/02/2014   History of duodenal ulcer 08/02/2014   Seasonal allergies 08/02/2014   Hypertension associated with diabetes (HCC) 08/02/2014   Hyperlipidemia associated with type 2 diabetes mellitus (HCC) 08/02/2014   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   History of joint replacement 03/01/2014   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) 11/07/2009   Social History   Tobacco Use   Smoking status: Never   Smokeless tobacco: Never  Substance Use Topics   Alcohol use: Not Currently    Alcohol/week: 0.0 standard drinks    Comment: rarely   Allergies  Allergen Reactions   Erythromycin Anaphylaxis    REACTION: Rash, hives   Ciprofloxacin Rash   Daypro [Oxaprozin] Rash   Enalapril Maleate Nausea Only and Rash   Nsaids Nausea Only   Vioxx [Rofecoxib] Nausea Only   Current Meds  Medication Sig   alendronate (FOSAMAX) 70 MG tablet TAKE 1 TABLET(70 MG) BY MOUTH 1 TIME A WEEK WITH A FULL GLASS OF WATER ON AN EMPTY STOMACH   anastrozole (ARIMIDEX) 1 MG tablet TAKE 1 TABLET(1 MG) BY MOUTH DAILY   aspirin 81 MG EC tablet Take 81 mg by mouth daily. Swallow whole.   atorvastatin (LIPITOR) 20 MG tablet  Take 1 tablet (20 mg total) by mouth daily.   budesonide (PULMICORT FLEXHALER) 180 MCG/ACT inhaler Inhale 1 puff into the lungs 2 (two) times daily.   Calcium Carbonate-Vit D-Min (CALTRATE 600+D PLUS MINERALS) 600-800 MG-UNIT TABS Take 1 tablet by mouth in the morning and at bedtime.   clobetasol (TEMOVATE) 0.05 % external solution Apply 1 application topically 2 (two) times daily as needed (scalp). Reported on 08/16/2015   Dulaglutide (TRULICITY) 1.5 MG/0.5ML SOPN Inject 1.5 mg into the skin once a week.   fenofibrate (TRICOR) 145 MG tablet Take 1 tablet (145 mg total) by mouth daily.   fluticasone (FLONASE) 50 MCG/ACT nasal spray SHAKE LIQUID AND USE 2 SPRAYS IN EACH NOSTRIL EVERY DAY   gabapentin (NEURONTIN) 300 MG capsule TAKE 1 CAPSULE(300 MG) BY MOUTH AT BEDTIME   glucose blood (FREESTYLE TEST STRIPS) test strip USE TO CHECK BLOOD SUGAR DAILY   loratadine (CLARITIN) 10 MG tablet Take 10 mg by mouth daily.   losartan-hydrochlorothiazide (HYZAAR) 100-25 MG tablet TAKE 1 TABLET BY MOUTH DAILY   Multiple Vitamins-Minerals (CENTRUM SILVER 50+WOMEN) TABS Take 1 tablet by mouth daily.   omeprazole (PRILOSEC) 20 MG capsule Take 1 capsule (20 mg total) by mouth daily. Take one tablet  before dinner.   Polyethyl Glycol-Propyl Glycol (SYSTANE OP) Apply to eye.   Probiotic Product (PROBIOTIC DAILY PO) Take by mouth. Schiff Digestive Advantage   valACYclovir (VALTREX) 500 MG tablet TAKE 1 TABLET(500 MG) BY MOUTH TWICE DAILY   Immunization History  Administered Date(s) Administered   Fluad Quad(high Dose 65+) 04/30/2019, 05/12/2020   Hep A / Hep B 05/29/2009, 07/03/2009, 11/24/2009   Hepb-cpg 10/26/2021, 11/28/2021   Influenza, High Dose Seasonal PF 04/18/2014, 04/13/2015, 04/30/2018, 04/10/2021   Influenza, Seasonal, Injecte, Preservative Fre 06/26/2016   Influenza-Unspecified 05/17/2009, 04/02/2011, 05/20/2012, 06/02/2013, 07/01/2013, 04/18/2014, 04/13/2015, 06/27/2016, 04/11/2017   PFIZER  Comirnaty(Gray Top)Covid-19 Tri-Sucrose Vaccine 11/29/2020   PFIZER(Purple Top)SARS-COV-2 Vaccination 10/06/2019, 10/27/2019, 04/24/2020   Pfizer Covid-19 Vaccine Bivalent Booster 61yrs & up 05/02/2021   Pneumococcal Conjugate-13 04/18/2014   Pneumococcal Polysaccharide-23 10/06/2015   Td 05/17/2009, 05/12/2020   Typhoid Inactivated 05/17/2009   Yellow Fever 05/17/2009   Zoster Recombinat (Shingrix) 06/07/2021, 08/11/2021   Zoster, Live 04/02/2011       Objective:   Physical Exam BP (!) 122/50 (BP Location: Right Arm, Patient Position: Sitting, Cuff Size: Large)   Pulse 82   Ht 5\' 2"  (1.575 m)   Wt 168 lb 9.6 oz (76.5 kg)   LMP 08/26/1992 (Approximate)   SpO2 95%   BMI 30.84 kg/m  GENERAL: Awake, alert, fully ambulatory, no acute distress.  No conversational dyspnea HEAD: Normocephalic, atraumatic. EYES: Pupils equal, round, reactive to light.  No scleral icterus. MOUTH: Nose/mouth/throat not examined due to masking requirements for COVID 19. NECK: Supple. No thyromegaly. Trachea midline. No JVD.  No adenopathy. PULMONARY: Excellent air entry bilaterally.  Lungs clear to auscultation bilaterally. CARDIOVASCULAR: S1 and S2. Regular rate and rhythm.  No rubs murmurs or gallops heard. GASTROINTESTINAL: Benign. MUSCULOSKELETAL: No joint deformity, no clubbing, no edema. NEUROLOGIC: Awake, alert, no focal deficits.  Speech is fluent.  No gait disturbance noted, fully ambulatory. SKIN: Intact,warm,dry. PSYCH: Mood and behavior appropriate      Assessment & Plan:     ICD-10-CM   1. Reactive airways dysfunction syndrome (HCC)  J68.3    Well-controlled with inhaled corticosteroid    2. Chronic cough  R05.3    Currently improved on inhaled corticosteroid Reactive airways trigger likely    3. Pulmonary hypertension - mild  I27.20    Likely related to diastolic dysfunction plus OSA Needs ongoing follow-up with echocardiogram yearly    4. OSA (obstructive sleep apnea)  G47.33     Excellent compliance with CPAP CPAP at 5 to 20 cm H2O AutoSet Residual AHI on therapy 1.3    5. Malignant neoplasm of upper-outer quadrant of left breast in female, estrogen receptor positive (HCC)  C50.412    Z17.0      We will see the patient in follow-up in 4 months time she is to contact us prior to that time should any new difficulties arise.  Gailen Shelter, MD Advanced Bronchoscopy PCCM [facility]    *This note was dictated using voice recognition software/Dragon.  Despite best efforts to proofread, errors can occur which can change the meaning. Any transcriptional errors that result from this process are unintentional and may not be fully corrected at the time of dictation.

## 2021-12-07 ENCOUNTER — Other Ambulatory Visit: Payer: Self-pay

## 2021-12-07 MED ORDER — FENOFIBRATE 145 MG PO TABS: 145.0000 mg | ORAL_TABLET | Freq: Every day | ORAL | 2 refills | Status: DC

## 2021-12-31 ENCOUNTER — Telehealth: Payer: Self-pay | Admitting: Pulmonary Disease

## 2021-12-31 DIAGNOSIS — Z20822 Contact with and (suspected) exposure to covid-19: Secondary | ICD-10-CM | POA: Diagnosis not present

## 2021-12-31 NOTE — Telephone Encounter (Signed)
Lm for patient.  

## 2022-01-01 ENCOUNTER — Telehealth: Payer: Self-pay | Admitting: Pulmonary Disease

## 2022-01-01 MED ORDER — PULMICORT FLEXHALER 180 MCG/ACT IN AEPB
2.0000 | INHALATION_SPRAY | Freq: Two times a day (BID) | RESPIRATORY_TRACT | 11 refills | Status: DC
Start: 2022-01-01 — End: 2022-12-16

## 2022-01-01 NOTE — Telephone Encounter (Signed)
Pulmicort sent to preferred pharmacy. ?Patient is aware and voiced her understanding.  ?Nothing further needed.  ? ?

## 2022-01-01 NOTE — Telephone Encounter (Signed)
Lm x2 for patient.  Will close encounter per office protocol.   

## 2022-01-03 ENCOUNTER — Other Ambulatory Visit: Payer: Self-pay | Admitting: Family Medicine

## 2022-01-14 ENCOUNTER — Encounter: Payer: Self-pay | Admitting: Dietician

## 2022-01-14 ENCOUNTER — Encounter: Payer: Medicare Other | Attending: Internal Medicine | Admitting: Dietician

## 2022-01-14 ENCOUNTER — Ambulatory Visit: Payer: Medicare Other | Admitting: Dietician

## 2022-01-14 VITALS — Ht 62.0 in | Wt 165.5 lb

## 2022-01-14 DIAGNOSIS — K760 Fatty (change of) liver, not elsewhere classified: Secondary | ICD-10-CM | POA: Diagnosis not present

## 2022-01-14 DIAGNOSIS — E113293 Type 2 diabetes mellitus with mild nonproliferative diabetic retinopathy without macular edema, bilateral: Secondary | ICD-10-CM | POA: Diagnosis not present

## 2022-01-14 DIAGNOSIS — R140 Abdominal distension (gaseous): Secondary | ICD-10-CM

## 2022-01-14 NOTE — Progress Notes (Signed)
Medical Nutrition Therapy: Visit start time: 0930  end time: 1010  Assessment:  Diagnosis: T2DM, NAFLD, constipation, gastritis and gastroduodenitis, abdominal bloating, GERD Medical history changes: no changes Psychosocial issues/ stress concerns: none  Current weight: 165.5  Height: 5'2"  BMI: 30.27  Medications, supplement changes: updated list in medical record  Progress and evaluation:  Weight has decreased 3.3lbs since previous visit 11/23/21. Patient reports she had lost more weight but has regained some. She voices some disappointment with slow weight loss. Has begun tracking intake using MyPlate app Has been participating in TOPS support group Gastritis has improved, continues to have some bloating but is also improving. No constipation recently.   Physical activity: treadmill, NuStep, recumbant bike 60 minutes, 3-5 times a week  Dietary Intake:  Usual eating pattern includes 3 meals and 1 snacks per day. Dining out frequency: several meals per week.  Breakfast: cereal; egg/ egg sandwich on bread not biscuit Snack: none Lunch: salad with protein + crackers; sandwich (or flips with dinner food choices) Snack: none Supper: often out-- broiled fish vs fried; occ bbq (husband's favorite) Snack: often cookie or similar Beverages: water 40-50oz daily, decaf coffee and tea with sweetener, some diet soda  Intervention:   Nutrition Care Education:  Basic nutrition: general nutrition guidelines    Weight control: reviewed progress since previous visit; tracking food intake; dining out options; importance of balancing energy intake, exercise for best benefit and avoidance of undereating; discussed possibility of 1200kcal goal per patient question Advanced nutrition:  dining out Diabetes:  healthy carb choices   Additional Notes: Patient has ongoing support system in place with TOPS group, and is monitoring her exercise and food intake. Therefore no additional MNT follow up is  needed at this time.  Patient has been encouraged to call or schedule additional follow up if needed at a later time.  Nutritional Diagnosis:  [place]-2.2 Altered nutrition-related laboratory As related to type 2 diabetes.  As evidenced by elevated HbA1C. [place]-3.3 Overweight/obesity As related to history of excess calories and weight regain after cancer treatment.  As evidenced by patient with fatty liver, current BMI of 30.27, making diet and lifestyle changes to promote ongoing weight loss.   Education Materials given:  Company secretary for diabetes (ADA) Visit summary with Goals/ instructions to be viewed via Engineer, drilling who was taught:  Patient    Level of understanding: Verbalizes/ demonstrates competency   Demonstrated degree of understanding via:   Teach back Learning barriers: None  Willingness to learn/ readiness for change: Eager, change in progress   Monitoring and Evaluation:  Dietary intake, exercise, BG control, GI symptoms, and body weight      follow up: prn

## 2022-01-14 NOTE — Patient Instructions (Addendum)
Try graham crackers for evening snack. 1 1/2 whole cracker sheets = 15g or 1 serving carb. Add a small amount of peanut butter if needed.  Great job making healthy food choices overall, continue to work towards low calorie (low fat, carb controlled) choices at restaurants. Try sliced barbecue for lower fat option than pulled bbq.  Keep up the regular exercise, awesome job so far!

## 2022-01-25 ENCOUNTER — Encounter: Payer: Self-pay | Admitting: Family Medicine

## 2022-01-30 DIAGNOSIS — Z170 Estrogen receptor positive status [ER+]: Secondary | ICD-10-CM | POA: Diagnosis not present

## 2022-01-30 DIAGNOSIS — C50412 Malignant neoplasm of upper-outer quadrant of left female breast: Secondary | ICD-10-CM | POA: Diagnosis not present

## 2022-02-21 ENCOUNTER — Encounter: Payer: Self-pay | Admitting: Family Medicine

## 2022-02-22 ENCOUNTER — Telehealth: Payer: Self-pay | Admitting: *Deleted

## 2022-02-22 ENCOUNTER — Other Ambulatory Visit: Payer: Self-pay | Admitting: Nurse Practitioner

## 2022-02-22 ENCOUNTER — Encounter: Payer: Self-pay | Admitting: Hematology and Oncology

## 2022-02-22 NOTE — Telephone Encounter (Signed)
Received MyChart message from patient stating her insurance requires a PA on her Trulicity for future refills.  PA completed on CoverMyMeds and sent for review.  Can take up to 72 hours for a decision.

## 2022-03-05 ENCOUNTER — Ambulatory Visit (INDEPENDENT_AMBULATORY_CARE_PROVIDER_SITE_OTHER): Payer: Medicare Other | Admitting: Family Medicine

## 2022-03-05 ENCOUNTER — Encounter: Payer: Self-pay | Admitting: Family Medicine

## 2022-03-05 VITALS — BP 130/60 | HR 82 | Temp 98.2°F | Ht 62.0 in | Wt 163.2 lb

## 2022-03-05 DIAGNOSIS — E663 Overweight: Secondary | ICD-10-CM

## 2022-03-05 DIAGNOSIS — E113293 Type 2 diabetes mellitus with mild nonproliferative diabetic retinopathy without macular edema, bilateral: Secondary | ICD-10-CM

## 2022-03-05 DIAGNOSIS — I2510 Atherosclerotic heart disease of native coronary artery without angina pectoris: Secondary | ICD-10-CM | POA: Diagnosis not present

## 2022-03-05 LAB — POCT GLYCOSYLATED HEMOGLOBIN (HGB A1C): Hemoglobin A1C: 6.3 % — AB (ref 4.0–5.6)

## 2022-03-05 NOTE — Assessment & Plan Note (Signed)
Chronic, stable control  She stopped the Trulicity given possible associated bloating.  She did not notice any improvement with bloating off of the medication and she noted her weight and blood sugars increasing.  She has restarted the Trulicity 1.5 mg weekly for the last 2 months.  She states she is doing well on it at this time.  We will continue this current dose and she continues to lose weight.  She will call for possible increase in the medication dose if she notes the weight loss plateau.

## 2022-03-05 NOTE — Progress Notes (Signed)
Patient ID: Katie [name], female    DOB: [birth year], 76 y.o.   MRN: [id number]  This visit was conducted in person.  BP 130/60   Pulse 82   Temp 98.2 F (36.8 C) (Oral)   Ht '5\' 2"'$  (1.575 m)   Wt 163 lb 4 oz (74 kg)   LMP 08/26/1992 (Approximate)   SpO2 97%   BMI 29.86 kg/m    CC:  Chief Complaint  Patient presents with   Diabetes   Weight Management Screening    Subjective:   HPI: Katie [name] is a 76 y.o. female presenting on 03/05/2022 for Diabetes and Weight Management Screening   Diabetes: Continued excellent control of diabetes  She  stopped Trulicity for 1 month ( thought caused bloating).. had minimal improvement of bloating of the medication.. so restarted as weight increased back up.  She has 2 visits with nutritionist. Lab Results  Component Value Date   HGBA1C 6.3 (A) 03/05/2022  Using medications without difficulties: Hypoglycemic episodes: none Hyperglycemic episodes: none Feet problems: no ulcers Blood Sugars averaging: FBS 115-120 eye exam within last year:  Wt Readings from Last 3 Encounters:  03/05/22 163 lb 4 oz (74 kg)  01/14/22 165 lb 8 oz (75.1 kg)  12/03/21 168 lb 9.6 oz (76.5 kg)  Body mass index is 29.86 kg/m.  Exercise: 3-5 days a week in gym      Relevant past medical, surgical, family and social history reviewed and updated as indicated. Interim medical history since our last visit reviewed. Allergies and medications reviewed and updated. Outpatient Medications Prior to Visit  Medication Sig Dispense Refill   alendronate (FOSAMAX) 70 MG tablet TAKE 1 TABLET(70 MG) BY MOUTH 1 TIME A WEEK WITH A FULL GLASS OF WATER ON AN EMPTY STOMACH 12 tablet 3   anastrozole (ARIMIDEX) 1 MG tablet TAKE 1 TABLET(1 MG) BY MOUTH DAILY 90 tablet 3   aspirin 81 MG EC tablet Take 81 mg by mouth daily. Swallow whole.     atorvastatin (LIPITOR) 20 MG tablet Take 1 tablet (20 mg total) by mouth daily. 90 tablet 3   budesonide (PULMICORT FLEXHALER)  180 MCG/ACT inhaler Inhale 2 puffs into the lungs 2 (two) times daily. 1 each 11   Calcium Carbonate-Vit D-Min (CALTRATE 600+D PLUS MINERALS) 600-800 MG-UNIT TABS Take 1 tablet by mouth in the morning and at bedtime.     clobetasol (TEMOVATE) 0.05 % external solution Apply 1 application topically 2 (two) times daily as needed (scalp). Reported on 08/16/2015     Dulaglutide (TRULICITY) 1.5 KN/3.9JQ SOPN Inject 1.5 mg into the skin once a week. 3 mL 11   fenofibrate (TRICOR) 145 MG tablet Take 1 tablet (145 mg total) by mouth daily. 90 tablet 2   fluticasone (FLONASE) 50 MCG/ACT nasal spray SHAKE LIQUID AND USE 2 SPRAYS IN EACH NOSTRIL EVERY DAY 16 g 2   gabapentin (NEURONTIN) 300 MG capsule TAKE 1 CAPSULE(300 MG) BY MOUTH AT BEDTIME 90 capsule 3   glucose blood (FREESTYLE TEST STRIPS) test strip USE TO CHECK BLOOD SUGAR DAILY 50 strip 11   loratadine (CLARITIN) 10 MG tablet Take 10 mg by mouth daily.     losartan-hydrochlorothiazide (HYZAAR) 100-25 MG tablet TAKE 1 TABLET BY MOUTH DAILY 90 tablet 1   Multiple Vitamins-Minerals (CENTRUM SILVER 50+WOMEN) TABS Take 1 tablet by mouth daily.     omeprazole (PRILOSEC) 20 MG capsule Take 1 capsule (20 mg total) by mouth daily. Take one tablet 41mns before dinner.  90 capsule 3   Polyethyl Glycol-Propyl Glycol (SYSTANE OP) Apply to eye.     Probiotic Product (PROBIOTIC DAILY PO) Take by mouth. Schiff Digestive Advantage     valACYclovir (VALTREX) 500 MG tablet TAKE 1 TABLET(500 MG) BY MOUTH TWICE DAILY 6 tablet 2   No facility-administered medications prior to visit.     Per HPI unless specifically indicated in ROS section below Review of Systems  Constitutional:  Negative for fatigue and fever.  HENT:  Negative for congestion.   Eyes:  Negative for pain.  Respiratory:  Negative for cough and shortness of breath.   Cardiovascular:  Negative for chest pain, palpitations and leg swelling.  Gastrointestinal:  Negative for abdominal pain.   Genitourinary:  Negative for dysuria and vaginal bleeding.  Musculoskeletal:  Negative for back pain.  Neurological:  Negative for syncope, light-headedness and headaches.  Psychiatric/Behavioral:  Negative for dysphoric mood.    Objective:  BP 130/60   Pulse 82   Temp 98.2 F (36.8 C) (Oral)   Ht '5\' 2"'$  (1.575 m)   Wt 163 lb 4 oz (74 kg)   LMP 08/26/1992 (Approximate)   SpO2 97%   BMI 29.86 kg/m   Wt Readings from Last 3 Encounters:  03/05/22 163 lb 4 oz (74 kg)  01/14/22 165 lb 8 oz (75.1 kg)  12/03/21 168 lb 9.6 oz (76.5 kg)      Physical Exam Constitutional:      General: She is not in acute distress.    Appearance: Normal appearance. She is well-developed. She is not ill-appearing or toxic-appearing.  HENT:     Head: Normocephalic.     Right Ear: Hearing, tympanic membrane, ear canal and external ear normal. Tympanic membrane is not erythematous, retracted or bulging.     Left Ear: Hearing, tympanic membrane, ear canal and external ear normal. Tympanic membrane is not erythematous, retracted or bulging.     Nose: No mucosal edema or rhinorrhea.     Right Sinus: No maxillary sinus tenderness or frontal sinus tenderness.     Left Sinus: No maxillary sinus tenderness or frontal sinus tenderness.     Mouth/Throat:     Pharynx: Uvula midline.  Eyes:     General: Lids are normal. Lids are everted, no foreign bodies appreciated.     Conjunctiva/sclera: Conjunctivae normal.     Pupils: Pupils are equal, round, and reactive to light.  Neck:     Thyroid: No thyroid mass or thyromegaly.     Vascular: No carotid bruit.     Trachea: Trachea normal.  Cardiovascular:     Rate and Rhythm: Normal rate and regular rhythm.     Pulses: Normal pulses.     Heart sounds: Normal heart sounds, S1 normal and S2 normal. No murmur heard.    No friction rub. No gallop.  Pulmonary:     Effort: Pulmonary effort is normal. No tachypnea or respiratory distress.     Breath sounds: Normal breath  sounds. No decreased breath sounds, wheezing, rhonchi or rales.  Abdominal:     General: Bowel sounds are normal.     Palpations: Abdomen is soft.     Tenderness: There is no abdominal tenderness.  Musculoskeletal:     Cervical back: Normal range of motion and neck supple.  Skin:    General: Skin is warm and dry.     Findings: No rash.  Neurological:     Mental Status: She is alert.  Psychiatric:  Mood and Affect: Mood is not anxious or depressed.        Speech: Speech normal.        Behavior: Behavior normal. Behavior is cooperative.        Thought Content: Thought content normal.        Judgment: Judgment normal.       Results for orders placed or performed in visit on 03/05/22  POCT glycosylated hemoglobin (Hb A1C)  Result Value Ref Range   Hemoglobin A1C 6.3 (A) 4.0 - 5.6 %   HbA1c POC (<> result, manual entry)     HbA1c, POC (prediabetic range)     HbA1c, POC (controlled diabetic range)       COVID 19 screen:  No recent travel or known exposure to COVID19 The patient denies respiratory symptoms of COVID 19 at this time. The importance of social distancing was discussed today.   Assessment and Plan Problem List Items Addressed This Visit     Controlled type 2 diabetes with retinopathy ([place]) - Primary (Chronic)    Chronic, stable control  She stopped the Trulicity given possible associated bloating.  She did not notice any improvement with bloating off of the medication and she noted her weight and blood sugars increasing.  She has restarted the Trulicity 1.5 mg weekly for the last 2 months.  She states she is doing well on it at this time.  We will continue this current dose and she continues to lose weight.  She will call for possible increase in the medication dose if she notes the weight loss plateau.      Relevant Orders   POCT glycosylated hemoglobin (Hb A1C) (Completed)   Overweight (BMI 25.0-29.9)    Chronic, improving  She has crossed the threshold  from obesity to overweight status.  We discussed continuing Trulicity 1.5 mg weekly as well as continuing dietary changes that she learned at the nutritionist.  She will also continue her active lifestyle and exercise 3 to 5 days/week.          Eliezer Lofts, MD

## 2022-03-05 NOTE — Assessment & Plan Note (Signed)
Chronic, improving  She has crossed the threshold from obesity to overweight status.  We discussed continuing Trulicity 1.5 mg weekly as well as continuing dietary changes that she learned at the nutritionist.  She will also continue her active lifestyle and exercise 3 to 5 days/week.

## 2022-03-05 NOTE — Patient Instructions (Addendum)
Continue working on healthy eating, exercise and weight loss.  Continue Trulicity 1.5 mg weekly.

## 2022-03-12 DIAGNOSIS — Z471 Aftercare following joint replacement surgery: Secondary | ICD-10-CM | POA: Diagnosis not present

## 2022-03-12 DIAGNOSIS — I700 Atherosclerosis of aorta: Secondary | ICD-10-CM | POA: Diagnosis not present

## 2022-03-12 DIAGNOSIS — I878 Other specified disorders of veins: Secondary | ICD-10-CM | POA: Diagnosis not present

## 2022-03-12 DIAGNOSIS — M5440 Lumbago with sciatica, unspecified side: Secondary | ICD-10-CM | POA: Diagnosis not present

## 2022-03-12 DIAGNOSIS — M5441 Lumbago with sciatica, right side: Secondary | ICD-10-CM | POA: Diagnosis not present

## 2022-03-12 DIAGNOSIS — Z96641 Presence of right artificial hip joint: Secondary | ICD-10-CM | POA: Diagnosis not present

## 2022-03-12 DIAGNOSIS — M25551 Pain in right hip: Secondary | ICD-10-CM | POA: Diagnosis not present

## 2022-03-27 ENCOUNTER — Encounter: Payer: Self-pay | Admitting: Pulmonary Disease

## 2022-03-27 ENCOUNTER — Ambulatory Visit (INDEPENDENT_AMBULATORY_CARE_PROVIDER_SITE_OTHER): Payer: Medicare Other | Admitting: Pulmonary Disease

## 2022-03-27 VITALS — BP 126/64 | HR 69 | Temp 97.9°F | Ht 62.0 in | Wt 164.8 lb

## 2022-03-27 DIAGNOSIS — G4733 Obstructive sleep apnea (adult) (pediatric): Secondary | ICD-10-CM | POA: Diagnosis not present

## 2022-03-27 DIAGNOSIS — R053 Chronic cough: Secondary | ICD-10-CM | POA: Diagnosis not present

## 2022-03-27 DIAGNOSIS — J683 Other acute and subacute respiratory conditions due to chemicals, gases, fumes and vapors: Secondary | ICD-10-CM

## 2022-03-27 DIAGNOSIS — C50412 Malignant neoplasm of upper-outer quadrant of left female breast: Secondary | ICD-10-CM

## 2022-03-27 DIAGNOSIS — Z170 Estrogen receptor positive status [ER+]: Secondary | ICD-10-CM

## 2022-03-27 NOTE — Progress Notes (Signed)
Subjective:    Patient ID: Katie [name], female    DOB: 02/26/1946, 76 y.o.   MRN: [id number] Patient Care Team: [name], MD as PCP - General (Family Medicine) Kate Sable, MD as PCP - Cardiology (Cardiology) Birder [name], MD as Referring Physician (Ophthalmology) [name], MD as Consulting Physician (Dermatology) [name], MD as Consulting Physician (General Surgery) [name], MD as Consulting Physician (Dentistry) [name], MD (Inactive) as Consulting Physician (General Surgery) [name], MD as Consulting Physician (Hematology and Oncology) [name], MD as Attending Physician (Radiation Oncology) Rolm Bookbinder, MD as Consulting Physician (General Surgery)  Chief Complaint  Patient presents with   Follow-up    Breathing has improved-dry cough at times prod with greenish sputum. Wearing cpap avg 8hr nightly.     HPI Katie [name] is a 76 year old lifelong never smoker, with a history of HER-2 positive breast cancer status post bilateral mastectomies, radiation and neoadjuvant chemotherapy.  She was initially seen here on 26 January 2020 for abnormal chest x-ray and for issues with cough that started after initiation of anastrozole.  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  At this point inhaled corticosteroid therapy with Arnuity was started for her cough issue.  She was last seen here on 03 December 2021.  At that time she continued to do well on [place].  Her insurance however was not covering [place] anymore and she was switched to [place].  She is tolerating the Pulmicort well she is only using 1 puff twice a day and notices some occasional breakthrough with her cough.  Subsequently she has also been noted to have mild pulmonary hypertension and duration of this issue led to home sleep study which showed moderate sleep apnea.   She is currently using CPAP of auto CPAP at 5 to 20 cm H2O of pressure. She notes that she continues to do well with this.  She feels that she gets restorative sleep.  She does not endorse dyspnea. Cough continues to be controlled.  We reviewed her compliance report and she has been compliant with CPAP.  She is to travel [place] and will need to take a portable CPAP machine she has been investigating these devices on her own.  She wanted to know what type of plug was necessary for her travel CPAP after we reviewed it together it appears that the model she is looking at is rated for 100 to 240 V current and comes with adapters already.     DATA: 09/24/2019 2D echo: LVEF 60 to 65%, normal LV function, indeterminate LV diastolic parameters. Mildly elevated pulmonary artery systolic pressure. 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  Very mild cylindrical bronchiectasis in the lungs bilaterally with some mucus impaction right lower lobe.  Mediastinal adenopathy. 02/17/2020 PFT: Normal spirometry and lung volumes 08/31/2021 2D echo: LVEF 60 to 65%, grade I DD, mildly elevated pulmonary artery systolic pressure.  In essence, unchanged from prior     Review of Systems A 10 point review of systems was performed and it is as noted above otherwise negative.  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    Urine WBC increased 01/05/2019   Anemia 10/15/2018   Port-A-Cath in place 09/29/2018   Diabetic retinopathy ([place]) 09/29/2018   Genetic testing 09/23/2018   Family history of leukemia 09/16/2018   Family history of breast cancer    Malignant neoplasm of upper-outer quadrant of left breast in female, estrogen receptor positive ([place]) 09/11/2018   Family history of breast cancer in sister 08/28/2018   Diverticular disease 10/17/2017   Primary osteoarthritis of right knee 03/07/2017   HSV-1 (herpes simplex virus 1) infection, 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   Vitamin D deficiency 10/11/2016   Family history of thyroid disorder 04/04/2015   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   Counseling regarding end of life decision making 10/04/2014   OA (osteoarthritis) of neck 08/02/2014   History of duodenal ulcer 08/02/2014   Seasonal allergies 08/02/2014   Hypertension associated with diabetes ([place]) 08/02/2014   Hyperlipidemia associated with type 2 diabetes mellitus ([place]) [id number]   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   History of joint replacement 03/01/2014   Controlled type 2 diabetes with retinopathy ([place]) 11/07/2009   Social History   Tobacco Use   Smoking status: Never   Smokeless tobacco: Never  Substance Use Topics   Alcohol use: Not Currently    Alcohol/week: 0.0 standard drinks of alcohol    Comment: rarely   Allergies  Allergen Reactions   Erythromycin Anaphylaxis    REACTION: Rash, hives   Ciprofloxacin Rash   Daypro [Oxaprozin] Rash   Enalapril Maleate Nausea Only and Rash   Nsaids Nausea Only   Vioxx [Rofecoxib] Nausea Only   Current Meds  Medication Sig   alendronate (FOSAMAX) 70 MG tablet TAKE 1 TABLET(70 MG) BY MOUTH 1 TIME A WEEK WITH A FULL GLASS OF WATER ON AN EMPTY STOMACH   anastrozole (ARIMIDEX) 1 MG tablet TAKE 1 TABLET(1 MG) BY MOUTH DAILY   aspirin 81 MG EC tablet Take 81 mg by mouth daily. Swallow whole.   atorvastatin (LIPITOR) 20 MG tablet Take 1 tablet (20 mg total)  by mouth daily.   budesonide (PULMICORT FLEXHALER) 180 MCG/ACT inhaler Inhale 2 puffs into the lungs 2 (two) times daily.   Calcium Carbonate-Vit D-Min (CALTRATE 600+D PLUS MINERALS) 600-800 MG-UNIT TABS Take 1 tablet by mouth in the morning and at bedtime.   clobetasol (TEMOVATE) 0.05 % external solution Apply 1 application topically 2 (two) times daily as needed (scalp). Reported on 08/16/2015   Dulaglutide (TRULICITY) 1.5 0000000 SOPN Inject 1.5 mg into the skin once a week.   fenofibrate (TRICOR) 145 MG tablet Take 1 tablet (145 mg total) by mouth daily.   fluticasone (FLONASE) 50 MCG/ACT nasal spray SHAKE LIQUID AND USE 2 SPRAYS IN EACH NOSTRIL EVERY DAY   gabapentin (NEURONTIN) 300 MG capsule TAKE 1 CAPSULE(300 MG) BY MOUTH AT BEDTIME   glucose blood (FREESTYLE TEST STRIPS) test strip USE TO CHECK BLOOD SUGAR DAILY   loratadine (CLARITIN) 10 MG tablet Take 10 mg by mouth daily.   losartan-hydrochlorothiazide (HYZAAR) 100-25 MG tablet TAKE 1 TABLET BY MOUTH DAILY   Multiple Vitamins-Minerals (CENTRUM SILVER 50+WOMEN) TABS Take 1 tablet by mouth daily.   omeprazole (PRILOSEC) 20 MG capsule Take 1 capsule (20 mg total) by mouth daily. Take one tablet 76mns before dinner.  Polyethyl Glycol-Propyl Glycol (SYSTANE OP) Apply to eye.   Probiotic Product (PROBIOTIC DAILY PO) Take by mouth. Schiff Digestive Advantage   valACYclovir (VALTREX) 500 MG tablet TAKE 1 TABLET(500 MG) BY MOUTH TWICE DAILY   Immunization History  Administered Date(s) Administered   Fluad Quad(high Dose 65+) 04/30/2019, 05/12/2020   Hep A / Hep B 05/29/2009, 07/03/2009, 11/24/2009   Hepb-cpg 10/26/2021, 11/28/2021   Influenza, High Dose Seasonal PF 04/18/2014, 04/13/2015, 04/30/2018, 04/10/2021   Influenza, Seasonal, Injecte, Preservative Fre 06/26/2016   Influenza-Unspecified 05/17/2009, 04/02/2011, 05/20/2012, 06/02/2013, 07/01/2013, 04/18/2014, 04/13/2015, 06/27/2016, 04/11/2017   PFIZER Comirnaty(Gray Top)Covid-19  Tri-Sucrose Vaccine 11/29/2020   PFIZER(Purple Top)SARS-COV-2 Vaccination 10/06/2019, 10/27/2019, 04/24/2020   Pfizer Covid-19 Vaccine Bivalent Booster 37yr & up 05/02/2021   Pneumococcal Conjugate-13 04/18/2014   Pneumococcal Polysaccharide-23 10/06/2015   Td 05/17/2009, 05/12/2020   Typhoid Inactivated 05/17/2009   Yellow Fever 05/17/2009   Zoster Recombinat (Shingrix) 06/07/2021, 08/11/2021   Zoster, Live 04/02/2011       Objective:   Physical Exam BP 126/64 (BP Location: Right Arm, Cuff Size: Normal)   Pulse 69   Temp 97.9 F (36.6 C) (Temporal)   Ht 5' 2"$  (1.575 m)   Wt 164 lb 12.8 oz (74.8 kg)   LMP 08/26/1992 (Approximate)   SpO2 97%   BMI 30.14 kg/m  GENERAL: Awake, alert, fully ambulatory, no acute distress.  No conversational dyspnea HEAD: Normocephalic, atraumatic. EYES: Pupils equal, round, reactive to light.  No scleral icterus. MOUTH: Nose/mouth/throat not examined due to masking requirements for COVID 19. NECK: Supple. No thyromegaly. Trachea midline. No JVD.  No adenopathy. PULMONARY: Excellent air entry bilaterally.  Lungs clear to auscultation bilaterally. CARDIOVASCULAR: S1 and S2. Regular rate and rhythm.  No rubs murmurs or gallops heard. GASTROINTESTINAL: Benign. MUSCULOSKELETAL: No joint deformity, no clubbing, no edema. NEUROLOGIC: Awake, alert, no focal deficits.  Speech is fluent.  No gait disturbance noted, fully ambulatory. SKIN: Intact,warm,dry. PSYCH: Mood and behavior appropriate     Assessment & Plan:     ICD-10-CM   1. Reactive airways dysfunction syndrome (HCC)  J68.3    Increase Pulmicort to 2 puffs twice a day Continue as needed albuterol    2. Chronic cough  R05.3    Related to above Responds to inhaled corticosteroids    3. OSA (obstructive sleep apnea)  G47.33    Compliant with CPAP Continue CPAP auto 5 to 20 cm H20 Patient to obtain travel CPAP Make sure to use prior to travel    4. Malignant neoplasm of upper-outer  quadrant of left breast in female, estrogen receptor positive ([place]  C50.412    Z17.0    Patient on anastrozole Follows with oncology Anastrozole can also aggravate cough     Will see the patient in follow-up in 6 months time she is to call sooner should any new problems arise.  CRenold Don MD Advanced Bronchoscopy PCCM [facility]    *This note was dictated using voice recognition software/Dragon.  Despite best efforts to proofread, errors can occur which can change the meaning. Any transcriptional errors that result from this process are unintentional and may not be fully corrected at the time of dictation.

## 2022-03-27 NOTE — Patient Instructions (Signed)
It seems that your travel CPAP will work well for you by the printout that I saw.  Continue using your Pulmicort for your cough issues  See you in follow-up in 6 months time call sooner should any new problems arise.

## 2022-04-01 ENCOUNTER — Other Ambulatory Visit: Payer: Self-pay | Admitting: Family Medicine

## 2022-04-07 ENCOUNTER — Encounter: Payer: Self-pay | Admitting: Family Medicine

## 2022-04-08 ENCOUNTER — Telehealth: Payer: Self-pay

## 2022-04-08 NOTE — Telephone Encounter (Signed)
Anda Kraft, can you follow-up on the PA for the patient.

## 2022-04-08 NOTE — Telephone Encounter (Signed)
Prior auth started for Trulicity 1.'5MG'$ /0.5ML pen-injectors. Katie [name] Key: [id number] - PA Case ID: [id number] - Rx #: [id number] Waiting for determination.

## 2022-04-09 NOTE — Telephone Encounter (Signed)
Prior auth for Trulicity 1.'5MG'$ /0.5ML pen-injectors has been denied. Negar Fermin Key: [id number] - PA Case ID: [id number] - Rx #: [id number]  [facility]  received a request from your provider for coverage of Trulicity (dulaglutide). The request was denied because: Your plan only covers this drug when A) your A1C is sent to [place], and your test results are in a certain range (C1Y greater than or equal to 6.5 percent), B) your 2-hour plasma glucose (PG) during oral glucose tolerance test (OGTT) is sent to [place], and your results are in a certain range (2-hour PG greater than or equal to 200 mg/dL), C) your random plasma glucose is sent to [place], and your test results are in a certain range (random plasma glucose greater than or equal to 200 mg/dL with symptoms of hyperglycemia (e.g., polyuria, polydipsia, polyphagia) or hyperglycemic crisis), or D) your fasting plasma glucose (FPG) is sent to [place], and your results are in a certain range (FPG greater than or equal to 126 mg/dL).   Denial letter sent to scanning.

## 2022-04-11 ENCOUNTER — Encounter: Payer: Self-pay | Admitting: Family Medicine

## 2022-04-11 NOTE — Telephone Encounter (Signed)
Noted.  Most recent A1c is 6.2 and fasting glucose 114

## 2022-04-13 ENCOUNTER — Other Ambulatory Visit: Payer: Self-pay | Admitting: Family Medicine

## 2022-04-13 DIAGNOSIS — E113293 Type 2 diabetes mellitus with mild nonproliferative diabetic retinopathy without macular edema, bilateral: Secondary | ICD-10-CM

## 2022-04-15 ENCOUNTER — Other Ambulatory Visit (INDEPENDENT_AMBULATORY_CARE_PROVIDER_SITE_OTHER): Payer: Medicare Other

## 2022-04-15 DIAGNOSIS — E113293 Type 2 diabetes mellitus with mild nonproliferative diabetic retinopathy without macular edema, bilateral: Secondary | ICD-10-CM | POA: Diagnosis not present

## 2022-04-15 LAB — POCT CBG (FASTING - GLUCOSE)-MANUAL ENTRY: Glucose Fasting, POC: 153 mg/dL — AB (ref 70–99)

## 2022-04-15 NOTE — Telephone Encounter (Signed)
Per appt notes pt has already scheduled for lab appt 04/15/22 at 9:45.

## 2022-04-17 ENCOUNTER — Other Ambulatory Visit: Payer: Self-pay | Admitting: Family Medicine

## 2022-04-17 ENCOUNTER — Other Ambulatory Visit: Payer: Self-pay | Admitting: Hematology and Oncology

## 2022-04-17 NOTE — Telephone Encounter (Signed)
Trulicity PA approved effective from 04/16/22 through 04/16/2025.

## 2022-05-06 ENCOUNTER — Telehealth: Payer: Self-pay | Admitting: Family Medicine

## 2022-05-06 ENCOUNTER — Encounter: Payer: Self-pay | Admitting: Family Medicine

## 2022-05-06 NOTE — Telephone Encounter (Signed)
Patient called to get a health nurse visit rescheduled.

## 2022-05-08 DIAGNOSIS — L821 Other seborrheic keratosis: Secondary | ICD-10-CM | POA: Diagnosis not present

## 2022-05-17 ENCOUNTER — Ambulatory Visit: Payer: Medicare Other

## 2022-05-21 DIAGNOSIS — M25561 Pain in right knee: Secondary | ICD-10-CM | POA: Diagnosis not present

## 2022-05-21 DIAGNOSIS — S83241A Other tear of medial meniscus, current injury, right knee, initial encounter: Secondary | ICD-10-CM | POA: Diagnosis not present

## 2022-05-24 ENCOUNTER — Telehealth: Payer: Self-pay | Admitting: Family Medicine

## 2022-05-24 DIAGNOSIS — E113293 Type 2 diabetes mellitus with mild nonproliferative diabetic retinopathy without macular edema, bilateral: Secondary | ICD-10-CM

## 2022-05-24 DIAGNOSIS — D509 Iron deficiency anemia, unspecified: Secondary | ICD-10-CM

## 2022-05-24 NOTE — Telephone Encounter (Signed)
-----   Message from Ellamae Sia sent at 05/15/2022  3:30 PM EDT ----- Regarding: Lab orders for Wednesday, 10.4.23 Patient is scheduled for CPX labs, please order future labs, Thanks , Karna Christmas

## 2022-05-27 LAB — HM DIABETES FOOT EXAM

## 2022-05-29 ENCOUNTER — Other Ambulatory Visit (INDEPENDENT_AMBULATORY_CARE_PROVIDER_SITE_OTHER): Payer: Medicare Other

## 2022-05-29 DIAGNOSIS — D509 Iron deficiency anemia, unspecified: Secondary | ICD-10-CM | POA: Diagnosis not present

## 2022-05-29 DIAGNOSIS — E113293 Type 2 diabetes mellitus with mild nonproliferative diabetic retinopathy without macular edema, bilateral: Secondary | ICD-10-CM

## 2022-05-29 LAB — CBC WITH DIFFERENTIAL/PLATELET
Basophils Absolute: 0 10*3/uL (ref 0.0–0.1)
Basophils Relative: 1 % (ref 0.0–3.0)
Eosinophils Absolute: 0.1 10*3/uL (ref 0.0–0.7)
Eosinophils Relative: 3.7 % (ref 0.0–5.0)
HCT: 31.4 % — ABNORMAL LOW (ref 36.0–46.0)
Hemoglobin: 10.9 g/dL — ABNORMAL LOW (ref 12.0–15.0)
Lymphocytes Relative: 20.9 % (ref 12.0–46.0)
Lymphs Abs: 0.6 10*3/uL — ABNORMAL LOW (ref 0.7–4.0)
MCHC: 34.8 g/dL (ref 30.0–36.0)
MCV: 98.5 fl (ref 78.0–100.0)
Monocytes Absolute: 0.4 10*3/uL (ref 0.1–1.0)
Monocytes Relative: 13.7 % — ABNORMAL HIGH (ref 3.0–12.0)
Neutro Abs: 1.9 10*3/uL (ref 1.4–7.7)
Neutrophils Relative %: 60.7 % (ref 43.0–77.0)
Platelets: 276 10*3/uL (ref 150.0–400.0)
RBC: 3.19 Mil/uL — ABNORMAL LOW (ref 3.87–5.11)
RDW: 15.1 % (ref 11.5–15.5)
WBC: 3.1 10*3/uL — ABNORMAL LOW (ref 4.0–10.5)

## 2022-05-29 LAB — COMPREHENSIVE METABOLIC PANEL
ALT: 18 U/L (ref 0–35)
AST: 18 U/L (ref 0–37)
Albumin: 4.2 g/dL (ref 3.5–5.2)
Alkaline Phosphatase: 37 U/L — ABNORMAL LOW (ref 39–117)
BUN: 20 mg/dL (ref 6–23)
CO2: 27 mEq/L (ref 19–32)
Calcium: 10.1 mg/dL (ref 8.4–10.5)
Chloride: 98 mEq/L (ref 96–112)
Creatinine, Ser: 1.12 mg/dL (ref 0.40–1.20)
GFR: 47.44 mL/min — ABNORMAL LOW (ref >60.00)
Glucose, Bld: 147 mg/dL — ABNORMAL HIGH (ref 70–99)
Potassium: 4.4 mEq/L (ref 3.5–5.1)
Sodium: 132 mEq/L — ABNORMAL LOW (ref 135–145)
Total Bilirubin: 0.4 mg/dL (ref 0.2–1.2)
Total Protein: 6.7 g/dL (ref 6.0–8.3)

## 2022-05-29 LAB — IBC + FERRITIN
Ferritin: 355.8 ng/mL — ABNORMAL HIGH (ref 10.0–291.0)
Iron: 112 ug/dL (ref 42–145)
Saturation Ratios: 22.2 % (ref 20.0–50.0)
TIBC: 504 ug/dL — ABNORMAL HIGH (ref 250.0–450.0)
Transferrin: 360 mg/dL (ref 212.0–360.0)

## 2022-05-29 LAB — LIPID PANEL
Cholesterol: 157 mg/dL (ref 0–200)
HDL: 49.8 mg/dL (ref >39.00)
LDL Cholesterol: 78 mg/dL (ref 0–99)
NonHDL: 106.78
Total CHOL/HDL Ratio: 3
Triglycerides: 144 mg/dL (ref 0.0–149.0)
VLDL: 28.8 mg/dL (ref 0.0–40.0)

## 2022-05-29 LAB — HEMOGLOBIN A1C: Hgb A1c MFr Bld: 6.9 % — ABNORMAL HIGH (ref 4.6–6.5)

## 2022-05-29 NOTE — Progress Notes (Signed)
No critical labs need to be addressed urgently. We will discuss labs in detail at upcoming office visit.   

## 2022-06-06 ENCOUNTER — Ambulatory Visit (INDEPENDENT_AMBULATORY_CARE_PROVIDER_SITE_OTHER): Payer: Medicare Other | Admitting: Family Medicine

## 2022-06-06 ENCOUNTER — Encounter: Payer: Self-pay | Admitting: Family Medicine

## 2022-06-06 VITALS — BP 122/60 | HR 74 | Temp 98.5°F | Ht 62.0 in | Wt 163.2 lb

## 2022-06-06 DIAGNOSIS — R7989 Other specified abnormal findings of blood chemistry: Secondary | ICD-10-CM

## 2022-06-06 DIAGNOSIS — Z0000 Encounter for general adult medical examination without abnormal findings: Secondary | ICD-10-CM | POA: Diagnosis not present

## 2022-06-06 DIAGNOSIS — E663 Overweight: Secondary | ICD-10-CM | POA: Diagnosis not present

## 2022-06-06 DIAGNOSIS — I700 Atherosclerosis of aorta: Secondary | ICD-10-CM | POA: Diagnosis not present

## 2022-06-06 DIAGNOSIS — E785 Hyperlipidemia, unspecified: Secondary | ICD-10-CM | POA: Diagnosis not present

## 2022-06-06 DIAGNOSIS — M810 Age-related osteoporosis without current pathological fracture: Secondary | ICD-10-CM

## 2022-06-06 DIAGNOSIS — E1169 Type 2 diabetes mellitus with other specified complication: Secondary | ICD-10-CM | POA: Diagnosis not present

## 2022-06-06 DIAGNOSIS — C50412 Malignant neoplasm of upper-outer quadrant of left female breast: Secondary | ICD-10-CM | POA: Diagnosis not present

## 2022-06-06 DIAGNOSIS — E559 Vitamin D deficiency, unspecified: Secondary | ICD-10-CM

## 2022-06-06 DIAGNOSIS — E538 Deficiency of other specified B group vitamins: Secondary | ICD-10-CM

## 2022-06-06 DIAGNOSIS — E1159 Type 2 diabetes mellitus with other circulatory complications: Secondary | ICD-10-CM

## 2022-06-06 DIAGNOSIS — I152 Hypertension secondary to endocrine disorders: Secondary | ICD-10-CM

## 2022-06-06 DIAGNOSIS — Z170 Estrogen receptor positive status [ER+]: Secondary | ICD-10-CM

## 2022-06-06 DIAGNOSIS — E113293 Type 2 diabetes mellitus with mild nonproliferative diabetic retinopathy without macular edema, bilateral: Secondary | ICD-10-CM

## 2022-06-06 DIAGNOSIS — R82998 Other abnormal findings in urine: Secondary | ICD-10-CM

## 2022-06-06 LAB — MICROALBUMIN / CREATININE URINE RATIO
Creatinine,U: 67 mg/dL
Microalb Creat Ratio: 1 mg/g (ref 0.0–30.0)
Microalb, Ur: <0.7 mg/dL (ref 0.0–1.9)

## 2022-06-06 MED ORDER — TRULICITY 3 MG/0.5ML ~~LOC~~ SOAJ: 3.0000 mg | SUBCUTANEOUS | 3 refills | Status: DC

## 2022-06-06 NOTE — Assessment & Plan Note (Signed)
Associated with diabetes. Yearly ophthalmology exam completed.

## 2022-06-06 NOTE — Assessment & Plan Note (Signed)
Stable, chronic.  Continue current medication.   Well-controlled on losartan hydrochlorothiazide 100/25 mg p.o. daily.  May be can contributing to slight low hyponatremia.  She will restrict water and increase salt intake somewhat.

## 2022-06-06 NOTE — Assessment & Plan Note (Signed)
Resolved with supplementation 

## 2022-06-06 NOTE — Assessment & Plan Note (Signed)
Chronic, slight worsening of A1c due to trip on cruise as well as steroid injection with knee injury. Given minimal weight loss with Trulicity 1.5 (she is tolerating this dose without any bloating at this time) and worsening of diabetes control we will increase her dose to 3 mg weekly.

## 2022-06-06 NOTE — Assessment & Plan Note (Signed)
Stable

## 2022-06-06 NOTE — Assessment & Plan Note (Signed)
On statin and aspirin anticoagulation.

## 2022-06-06 NOTE — Assessment & Plan Note (Signed)
Has had complete work-up to look into this.  Likely reactive.  Seems to be improving with time.

## 2022-06-06 NOTE — Progress Notes (Signed)
Patient ID: [name], female    DOB: [birth year], 76 y.o.   MRN: [id number]  This visit was conducted in person.  BP 122/60   Pulse 74   Temp 98.5 F (36.9 C) (Oral)   Ht '5\' 2"'$  (1.575 m)   Wt 163 lb 4 oz (74 kg)   LMP 08/26/1992 (Approximate)   SpO2 96%   BMI 29.86 kg/m    CC:  Chief Complaint  Patient presents with   Medicare Wellness    Subjective:   HPI: Katie [name] is a 76 y.o. female presenting on 06/06/2022 for Medicare Wellness  The patient presents for annual medicare wellness, complete physical and review of chronic health problems. He/She also has the following acute concerns today:  I have personally reviewed the Medicare Annual Wellness questionnaire and have noted 1. The patient's medical and social history 2. Their use of alcohol, tobacco or illicit drugs 3. Their current medications and supplements 4. The patient's functional ability including ADL's, fall risks, home safety risks and hearing or visual             impairment. 5. Diet and physical activities 6. Evidence for depression or mood disorders 7.         Updated provider list Cognitive evaluation was performed and recorded on pt medicare questionnaire form. The patients weight, height, BMI and visual acuity have been recorded in the chart   I have made referrals, counseling and provided education to the patient based review of the above and I have provided the pt with a written personalized care plan for preventive services.   Documentation of this information was scanned into the electronic record under the media tab.  Hearing Screening  Method: Audiometry   '500Hz'$  '1000Hz'$  '2000Hz'$  '4000Hz'$   Right ear '20 20 20 20  '$ Left ear '20 25 25 25  '$ Vision Screening - Comments:: Wear Glasses-Eye Exam at [facility] with Dr. George Ina 10/2021 Next Eye Exam 11/18/22  No falls in last 12 months.  [place] Office Visit from 06/06/2022 in [place] at [facility] Total Score 0        Advance directives and end of life planning reviewed in detail with patient and documented in EMR. Patient given handout on advance care directives if needed. HCPOA and living will updated if needed.   Diabetes: She is on Trulicity 1.5 mg weekly with good control of diabetes.   Medication is curbing her appetite  Had cortisone shot in knee in September Lab Results  Component Value Date   HGBA1C 6.9 (H) 05/29/2022  Using medications without difficulties: none Hypoglycemic episodes: none Hyperglycemic episodes: none Feet problems: no ulcers  Blood Sugars averaging: FBS 130-140 eye exam within last year:  Wt Readings from Last 3 Encounters:  06/06/22 163 lb 4 oz (74 kg)  03/27/22 164 lb 12.8 oz (74.8 kg)  03/05/22 163 lb 4 oz (74 kg)   Hypertension:  Well-controlled on losartan hydrochlorothiazide 100/25 mg p.o. daily BP Readings from Last 3 Encounters:  06/06/22 122/60  03/27/22 126/64  03/05/22 130/60  Using medication without problems or lightheadedness:  none Chest pain with exertion: none Edema: none Short of breath:  stable... see pulmonary note Average home BPs: Other issues:  Elevated Cholesterol: Cholesterol almost at goal on atorvastatin 20 mg p.o. daily and fenofibrate 145 mg p.o. daily Lab Results  Component Value Date   CHOL 157 05/29/2022   HDL 49.80 05/29/2022   LDLCALC 78 05/29/2022  LDLDIRECT 57.0 05/02/2020   TRIG 144.0 05/29/2022   CHOLHDL 3 05/29/2022  Using medications without problems: Muscle aches:  Diet compliance: heart healthy Exercise: 3 times a week Other complaints:   Breast cancer active treatment on Arimidex x 3 years, plan 5-7  Osteoporosis treated on Fosamax 70 mg weekly, calcium and vitamin D.   Followed by Dr. Duwayne [facility] for reactive airways and OSA On CPAP and using Pulmicort flex inhaler.  Relevant past medical, surgical, family and social history reviewed and updated as indicated. Interim medical history since our  last visit reviewed. Allergies and medications reviewed and updated. Outpatient Medications Prior to Visit  Medication Sig Dispense Refill   alendronate (FOSAMAX) 70 MG tablet TAKE 1 TABLET(70 MG) BY MOUTH 1 TIME A WEEK WITH A FULL GLASS OF WATER ON AN EMPTY STOMACH 12 tablet 3   anastrozole (ARIMIDEX) 1 MG tablet TAKE 1 TABLET(1 MG) BY MOUTH DAILY 90 tablet 3   aspirin 81 MG EC tablet Take 81 mg by mouth daily. Swallow whole.     atorvastatin (LIPITOR) 20 MG tablet Take 1 tablet (20 mg total) by mouth daily. 90 tablet 3   budesonide (PULMICORT FLEXHALER) 180 MCG/ACT inhaler Inhale 2 puffs into the lungs 2 (two) times daily. 1 each 11   Calcium Carbonate-Vit D-Min (CALTRATE 600+D PLUS MINERALS) 600-800 MG-UNIT TABS Take 1 tablet by mouth in the morning and at bedtime.     clobetasol (TEMOVATE) 0.05 % external solution Apply 1 application topically 2 (two) times daily as needed (scalp). Reported on 08/16/2015     Dulaglutide (TRULICITY) 1.5 MW/1.0UV SOPN Inject 1.5 mg into the skin once a week. 3 mL 11   fenofibrate (TRICOR) 145 MG tablet Take 1 tablet (145 mg total) by mouth daily. 90 tablet 2   fluticasone (FLONASE) 50 MCG/ACT nasal spray SHAKE LIQUID AND USE 2 SPRAYS IN EACH NOSTRIL EVERY DAY 16 g 11   gabapentin (NEURONTIN) 300 MG capsule TAKE 1 CAPSULE(300 MG) BY MOUTH AT BEDTIME 90 capsule 3   glucose blood (FREESTYLE TEST STRIPS) test strip USE TO CHECK BLOOD SUGAR DAILY 50 strip 11   loratadine (CLARITIN) 10 MG tablet Take 10 mg by mouth daily.     losartan-hydrochlorothiazide (HYZAAR) 100-25 MG tablet TAKE 1 TABLET BY MOUTH DAILY 90 tablet 0   Multiple Vitamins-Minerals (CENTRUM SILVER 50+WOMEN) TABS Take 1 tablet by mouth daily.     Polyethyl Glycol-Propyl Glycol (SYSTANE OP) Apply to eye.     Probiotic Product (PROBIOTIC DAILY PO) Take by mouth. Schiff Digestive Advantage     valACYclovir (VALTREX) 500 MG tablet TAKE 1 TABLET(500 MG) BY MOUTH TWICE DAILY 6 tablet 2   omeprazole  (PRILOSEC) 20 MG capsule Take 1 capsule (20 mg total) by mouth daily. Take one tablet 49mns before dinner. 90 capsule 3   No facility-administered medications prior to visit.     Per HPI unless specifically indicated in ROS section below Review of Systems  Constitutional:  Negative for fatigue and fever.  HENT:  Negative for congestion.   Eyes:  Negative for pain.  Respiratory:  Negative for cough and shortness of breath.   Cardiovascular:  Negative for chest pain, palpitations and leg swelling.  Gastrointestinal:  Negative for abdominal pain.  Genitourinary:  Negative for dysuria and vaginal bleeding.  Musculoskeletal:  Negative for back pain.  Neurological:  Negative for syncope, light-headedness and headaches.  Psychiatric/Behavioral:  Negative for dysphoric mood.    Objective:  BP 122/60   Pulse 74  Temp 98.5 F (36.9 C) (Oral)   Ht '5\' 2"'$  (1.575 m)   Wt 163 lb 4 oz (74 kg)   LMP 08/26/1992 (Approximate)   SpO2 96%   BMI 29.86 kg/m   Wt Readings from Last 3 Encounters:  06/06/22 163 lb 4 oz (74 kg)  03/27/22 164 lb 12.8 oz (74.8 kg)  03/05/22 163 lb 4 oz (74 kg)      Physical Exam Vitals and nursing note reviewed.  Constitutional:      General: She is not in acute distress.    Appearance: Normal appearance. She is well-developed. She is not ill-appearing or toxic-appearing.  HENT:     Head: Normocephalic.     Right Ear: Hearing, tympanic membrane, ear canal and external ear normal.     Left Ear: Hearing, tympanic membrane, ear canal and external ear normal.     Nose: Nose normal.  Eyes:     General: Lids are normal. Lids are everted, no foreign bodies appreciated.     Conjunctiva/sclera: Conjunctivae normal.     Pupils: Pupils are equal, round, and reactive to light.  Neck:     Thyroid: No thyroid mass or thyromegaly.     Vascular: No carotid bruit.     Trachea: Trachea normal.  Cardiovascular:     Rate and Rhythm: Normal rate and regular rhythm.     Heart  sounds: Normal heart sounds, S1 normal and S2 normal. No murmur heard.    No gallop.  Pulmonary:     Effort: Pulmonary effort is normal. No respiratory distress.     Breath sounds: Normal breath sounds. No wheezing, rhonchi or rales.  Abdominal:     General: Bowel sounds are normal. There is no distension or abdominal bruit.     Palpations: Abdomen is soft. There is no fluid wave or mass.     Tenderness: There is no abdominal tenderness. There is no guarding or rebound.     Hernia: No hernia is present.  Musculoskeletal:     Cervical back: Normal range of motion and neck supple.  Lymphadenopathy:     Cervical: No cervical adenopathy.  Skin:    General: Skin is warm and dry.     Findings: No rash.  Neurological:     Mental Status: She is alert.     Cranial Nerves: No cranial nerve deficit.     Sensory: No sensory deficit.  Psychiatric:        Mood and Affect: Mood is not anxious or depressed.        Speech: Speech normal.        Behavior: Behavior normal. Behavior is cooperative.        Judgment: Judgment normal.       Diabetic foot exam: Normal inspection No skin breakdown No calluses  Normal DP pulses Normal sensation to light touch and monofilament Nails normal  Results for orders placed or performed in visit on 05/29/22  Comprehensive metabolic panel  Result Value Ref Range   Sodium 132 (L) 135 - 145 mEq/L   Potassium 4.4 3.5 - 5.1 mEq/L   Chloride 98 96 - 112 mEq/L   CO2 27 19 - 32 mEq/L   Glucose, Bld 147 (H) 70 - 99 mg/dL   BUN 20 6 - 23 mg/dL   Creatinine, Ser 1.12 0.40 - 1.20 mg/dL   Total Bilirubin 0.4 0.2 - 1.2 mg/dL   Alkaline Phosphatase 37 (L) 39 - 117 U/L   AST 18 0 - 37 U/L  ALT 18 0 - 35 U/L   Total Protein 6.7 6.0 - 8.3 g/dL   Albumin 4.2 3.5 - 5.2 g/dL   GFR 47.44 (L) >60.00 mL/min   Calcium 10.1 8.4 - 10.5 mg/dL  Lipid panel  Result Value Ref Range   Cholesterol 157 0 - 200 mg/dL   Triglycerides 144.0 0.0 - 149.0 mg/dL   HDL 49.80 >39.00  mg/dL   VLDL 28.8 0.0 - 40.0 mg/dL   LDL Cholesterol 78 0 - 99 mg/dL   Total CHOL/HDL Ratio 3    NonHDL 106.78   Hemoglobin A1c  Result Value Ref Range   Hgb A1c MFr Bld 6.9 (H) 4.6 - 6.5 %  IBC + Ferritin  Result Value Ref Range   Iron 112 42 - 145 ug/dL   Transferrin 360.0 212.0 - 360.0 mg/dL   Saturation Ratios 22.2 20.0 - 50.0 %   Ferritin 355.8 (H) 10.0 - 291.0 ng/mL   TIBC 504.0 (H) 250.0 - 450.0 mcg/dL  CBC with Differential/Platelet  Result Value Ref Range   WBC 3.1 (L) 4.0 - 10.5 K/uL   RBC 3.19 (L) 3.87 - 5.11 Mil/uL   Hemoglobin 10.9 (L) 12.0 - 15.0 g/dL   HCT 31.4 (L) 36.0 - 46.0 %   MCV 98.5 78.0 - 100.0 fl   MCHC 34.8 30.0 - 36.0 g/dL   RDW 15.1 11.5 - 15.5 %   Platelets 276.0 150.0 - 400.0 K/uL   Neutrophils Relative % 60.7 43.0 - 77.0 %   Lymphocytes Relative 20.9 12.0 - 46.0 %   Monocytes Relative 13.7 (H) 3.0 - 12.0 %   Eosinophils Relative 3.7 0.0 - 5.0 %   Basophils Relative 1.0 0.0 - 3.0 %   Neutro Abs 1.9 1.4 - 7.7 K/uL   Lymphs Abs 0.6 (L) 0.7 - 4.0 K/uL   Monocytes Absolute 0.4 0.1 - 1.0 K/uL   Eosinophils Absolute 0.1 0.0 - 0.7 K/uL   Basophils Absolute 0.0 0.0 - 0.1 K/uL     COVID 19 screen:  No recent travel or known exposure to COVID19 The patient denies respiratory symptoms of COVID 19 at this time. The importance of social distancing was discussed today.   Assessment and Plan   The patient's preventative maintenance and recommended screening tests for an annual wellness exam were reviewed in full today. Brought up to date unless services declined.  Counselled on the importance of diet, exercise, and its role in overall health and mortality. The patient's FH and SH was reviewed, including their home life, tobacco status, and drug and alcohol status.   Last PAP 09/2014 nml, neg HPV, no further indicated. DVE:  no family history of ovarian or uterine cancer  No further mammogram after bilateral mastectomy. DEXA  ( osteoporosis in forearm,  nml in other sites, 09/2020) plans repeat in 2 years.  On fosamax 70 mg weekly, Also on Arimidex which can cause bone loss. Vaccines: uptodate flu,Td, uptodate with PNA and COVID series x 5, shingrix HAD RSV and new covid vaccine this week Colon: 10/2017 Dr. Lorenso Courier repeat 5 years. 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 Given minimal weight loss with Trulicity 1.5 (she is tolerating this dose without any bloating at this time) and worsening of diabetes control we  will increase her dose to 3 mg weekly.      Relevant Medications   Dulaglutide (TRULICITY) 3 HQ/7.5FF SOPN   Other Relevant Orders   Microalbumin / creatinine urine ratio   Hypertension associated with diabetes (HCC) (Chronic)    Stable, chronic.  Continue current medication.   Well-controlled on losartan hydrochlorothiazide 100/25 mg p.o. daily.  May be can contributing to slight low hyponatremia.  She will restrict water and increase salt intake somewhat.      Relevant Medications   Dulaglutide (TRULICITY) 3 MB/8.4YK SOPN   Aortic atherosclerosis (HCC)    On statin and aspirin anticoagulation.      Diabetic retinopathy ([place])    Associated with diabetes. Yearly ophthalmology exam completed.      Relevant Medications   Dulaglutide (TRULICITY) 3 ZL/9.3TT SOPN   Elevated ferritin    Has had complete work-up to look into this.  Likely reactive.  Seems to be improving with time.      Hyperlipidemia associated with type 2 diabetes mellitus (HCC)   Relevant Medications   Dulaglutide (TRULICITY) 3 SV/7.7LT SOPN   Malignant neoplasm of upper-outer quadrant of left breast in female, estrogen receptor positive ([place])    Active treatment by oncology with Arimidex on year 3      Osteoporosis of forearm    Relevant Orders   DG Bone Density   Overweight (BMI 25.0-29.9)   Urine WBC increased    Stable      Vitamin B 12 deficiency    Resolved with supplementation      Vitamin D deficiency    Resolved with supplementation      Other Visit Diagnoses     Medicare annual wellness visit, subsequent    -  Primary      Orders Placed This Encounter  Procedures   DG Bone Density    Standing Status:   Future    Standing Expiration Date:   06/07/2023    Order Specific Question:   Reason for Exam (SYMPTOM  OR DIAGNOSIS REQUIRED)    Answer:   osteoporosis    Order Specific Question:   Preferred imaging location?    Answer:   [facility]   Microalbumin / creatinine urine ratio   [facility] DIABETES FOOT EXAM    This external order was created through the Results Console.     Eliezer Lofts, MD

## 2022-06-06 NOTE — Patient Instructions (Addendum)
Keep working on healthy eating and regular exercise.  Increase Trulicity 3  mg weekly.. call if abdominal pain or not tolerating.  Can call to set up colonoscopy.  Please call the location of your choice from the menu below to schedule your Mammogram and/or Bone Density appointment.    [place] at [facility]   Phone:  [phone]   [address], [place] [address]                                            Services: 3D Mammogram and [place]  [place] at [facility])  Phone:  [phone]   [address] Room [place], [place] [address]                                              Services:  3D Mammogram and Bone Density

## 2022-06-06 NOTE — Assessment & Plan Note (Signed)
Active treatment by oncology with Arimidex on year 3

## 2022-06-17 DIAGNOSIS — M25561 Pain in right knee: Secondary | ICD-10-CM | POA: Diagnosis not present

## 2022-06-19 DIAGNOSIS — M25561 Pain in right knee: Secondary | ICD-10-CM | POA: Diagnosis not present

## 2022-06-24 DIAGNOSIS — M25561 Pain in right knee: Secondary | ICD-10-CM | POA: Diagnosis not present

## 2022-06-27 ENCOUNTER — Encounter: Payer: Self-pay | Admitting: Internal Medicine

## 2022-06-27 DIAGNOSIS — M25561 Pain in right knee: Secondary | ICD-10-CM | POA: Diagnosis not present

## 2022-07-01 ENCOUNTER — Other Ambulatory Visit: Payer: Self-pay | Admitting: Hematology and Oncology

## 2022-07-01 ENCOUNTER — Other Ambulatory Visit: Payer: Self-pay | Admitting: Family Medicine

## 2022-07-01 DIAGNOSIS — M25561 Pain in right knee: Secondary | ICD-10-CM | POA: Diagnosis not present

## 2022-07-03 DIAGNOSIS — M25561 Pain in right knee: Secondary | ICD-10-CM | POA: Diagnosis not present

## 2022-07-08 DIAGNOSIS — M25561 Pain in right knee: Secondary | ICD-10-CM | POA: Diagnosis not present

## 2022-07-10 DIAGNOSIS — M25561 Pain in right knee: Secondary | ICD-10-CM | POA: Diagnosis not present

## 2022-07-15 DIAGNOSIS — M25561 Pain in right knee: Secondary | ICD-10-CM | POA: Diagnosis not present

## 2022-07-22 DIAGNOSIS — M25561 Pain in right knee: Secondary | ICD-10-CM | POA: Diagnosis not present

## 2022-07-24 DIAGNOSIS — M25561 Pain in right knee: Secondary | ICD-10-CM | POA: Diagnosis not present

## 2022-07-25 DIAGNOSIS — Z9889 Other specified postprocedural states: Secondary | ICD-10-CM | POA: Diagnosis not present

## 2022-07-25 DIAGNOSIS — I671 Cerebral aneurysm, nonruptured: Secondary | ICD-10-CM | POA: Diagnosis not present

## 2022-07-25 DIAGNOSIS — Z8679 Personal history of other diseases of the circulatory system: Secondary | ICD-10-CM | POA: Diagnosis not present

## 2022-07-30 DIAGNOSIS — M25561 Pain in right knee: Secondary | ICD-10-CM | POA: Diagnosis not present

## 2022-07-31 ENCOUNTER — Other Ambulatory Visit: Payer: Self-pay | Admitting: Hematology and Oncology

## 2022-08-05 DIAGNOSIS — M25531 Pain in right wrist: Secondary | ICD-10-CM | POA: Diagnosis not present

## 2022-08-05 DIAGNOSIS — S63501A Unspecified sprain of right wrist, initial encounter: Secondary | ICD-10-CM | POA: Diagnosis not present

## 2022-08-05 DIAGNOSIS — M778 Other enthesopathies, not elsewhere classified: Secondary | ICD-10-CM | POA: Diagnosis not present

## 2022-08-06 NOTE — Progress Notes (Signed)
Patient Care Team: Katie Sanders, MD as PCP - General (Family Medicine) Katie Sable, MD as PCP - Cardiology (Cardiology) Katie [name], MD as Referring Physician (Ophthalmology) Katie Rack, MD as Consulting Physician (Dermatology) Katie Pert, MD as Consulting Physician (General Surgery) Katie Litter, MD as Consulting Physician (Dentistry) Katie Seltzer, MD (Inactive) as Consulting Physician (General Surgery) Katie Lose, MD as Consulting Physician (Hematology and Oncology) Katie Gibson, MD as Attending Physician (Radiation Oncology) Katie Bookbinder, MD as Consulting Physician (General Surgery)  DIAGNOSIS: No diagnosis found.  SUMMARY OF ONCOLOGIC HISTORY: Oncology History  Malignant neoplasm of upper-outer quadrant of left breast in female, estrogen receptor positive (Katie [name])  09/14/2018 Initial Diagnosis   With prior history of breast implants that were ruptured/replaced, patient had cellulitis LIQ left breast for 1 week, and UOQ asymmetry 1.5 cm, MRI right breast biopsy benign, left breast numerous masses 7 cm skin thickening: 2 biopsies from left breast: Grade 2-3 IDC ER PR positive, HER-2 positive, Ki-67 50%   09/16/2018 Cancer Staging   Staging form: Breast, AJCC 8th Edition - Clinical stage from 09/16/2018: Stage IIIB (cT4, cN0, cM0, G3, ER+, PR+, HER2+)    09/23/2018 Genetic Testing   Genetic testing performed through [facility]'s Common Hereditary Cancers Panel reported out on 09/23/2018 showed no pathogenic mutations.    The Common Hereditary Cancers Panel offered by [facility] includes sequencing and/or deletion duplication testing of the following 47 genes: APC, ATM, AXIN2, BARD1, BMPR1A, BRCA1, BRCA2, BRIP1, CDH1, CDKN2A (p14ARF), CDKN2A (p16INK4a), CKD4, CHEK2, CTNNA1, DICER1, EPCAM (Deletion/duplication testing only), GREM1 (promoter region deletion/duplication testing only), KIT, MEN1, MLH1, MSH2, MSH3, MSH6, MUTYH, NBN, NF1, NHTL1, PALB2,  PDGFRA, PMS2, POLD1, POLE, PTEN, RAD50, RAD51C, RAD51D, SDHB, SDHC, SDHD, SMAD4, SMARCA4. STK11, TP53, TSC1, TSC2, and VHL.  The following genes were evaluated for sequence changes only: SDHA and HOXB13 c.251G>A variant only.   09/30/2018 - 07/06/2020 Chemotherapy   palonosetron (ALOXI) injection 0.25 mg, 0.25 mg, Intravenous,  Once, 6 of 6 cycles. Administration: 0.25 mg (09/30/2018), 0.25 mg (10/21/2018), 0.25 mg (12/23/2018), 0.25 mg (01/13/2019), 0.25 mg (11/11/2018), 0.25 mg (12/02/2018)  pegfilgrastim-cbqv (UDENYCA) injection 6 mg, 6 mg, Subcutaneous, Once, 5 of 5 cycles. Administration: 6 mg (10/02/2018), 6 mg (10/23/2018), 6 mg (11/13/2018), 6 mg (12/04/2018), 6 mg (12/25/2018)  trastuzumab (HERCEPTIN) 600 mg in sodium chloride 0.9 % 250 mL chemo infusion, 609 mg, Intravenous,  Once, 6 of 6 cycles. Administration: 600 mg (09/30/2018), 450 mg (10/21/2018), 450 mg (12/23/2018), 450 mg (01/13/2019), 450 mg (11/11/2018), 450 mg (12/02/2018)  CARBOplatin (PARAPLATIN) 510 mg in sodium chloride 0.9 % 250 mL chemo infusion, 510 mg (103.4 % of original dose 493.2 mg), Intravenous,  Once, 6 of 6 cycles. Dose modification:   (original dose 493.2 mg, Cycle 1). Administration: 510 mg (09/30/2018), 430 mg (10/21/2018), 420 mg (12/23/2018), 420 mg (01/13/2019), 430 mg (11/11/2018), 430 mg (12/02/2018)  DOCEtaxel (TAXOTERE) 140 mg in sodium chloride 0.9 % 250 mL chemo infusion, 75 mg/m2 = 140 mg, Intravenous,  Once, 5 of 5 cycles. Dose modification: 60 mg/m2 (original dose 75 mg/m2, Cycle 2, Reason: Dose not tolerated). Administration: 140 mg (09/30/2018), 110 mg (10/21/2018), 110 mg (12/23/2018), 110 mg (11/11/2018), 110 mg (12/02/2018)  pertuzumab (PERJETA) 420 mg in sodium chloride 0.9 % 250 mL chemo infusion, 420 mg (100 % of original dose 420 mg), Intravenous, Once, 1 of 1 cycle. Dose modification: 420 mg (original dose 420 mg, Cycle 1, Reason: Provider Judgment). Administration: 420 mg (09/30/2018)  fosaprepitant (EMEND) 150 mg  dexamethasone  (  DECADRON) 12 mg in sodium chloride 0.9 % 145 mL IVPB, , Intravenous,  Once, 6 of 6 cycles. Administration:  (09/30/2018),  (10/21/2018),  (12/23/2018),  (01/13/2019),  (11/11/2018),  (12/02/2018)  ado-trastuzumab emtansine (KADCYLA) 260 mg in sodium chloride 0.9 % 250 mL chemo infusion, 3.6 mg/kg = 260 mg, Intravenous, Once, 6 of 10 cycles. Dose modification: 3 mg/kg (original dose 3.6 mg/kg, Cycle 5, Reason: Dose not tolerated), 2.4 mg/kg (original dose 3.6 mg/kg, Cycle 6, Reason: Provider Judgment). Administration: 260 mg (03/24/2019), 260 mg (04/14/2019), 200 mg (06/16/2019), 260 mg (05/05/2019), 200 mg (05/26/2019), 160 mg (07/07/2019)   01/19/2019 Breast MRI   Significant interval improvement, the multiple left breast masses and areas of non mass enhancement in all 4 quadrants have decreased in size and extent. The mass previously measuring 1.4 cm with diffuse enhancement now measures 9 mm with heterogeneous areas of enhancement. Current greatest transverse dimension is 5.4 x 3.9 cm, previously 7 cm x 5cm. 2. 4 mm masslike focus of enhancement at the base of the right nipple is unchanged.   03/08/2019 Surgery   Bilateral mastectomies Katie [name]) [phone]): right breast: no malignancy; left breast: IDC, grade 2, scattered over 7.5cm, clear margins. No lymph nodes were examined.   03/08/2019 Cancer Staging   Staging form: Breast, AJCC 8th Edition - Pathologic stage from 03/08/2019: No Stage Recommended (ypT3, pNX, cM0, G2, ER+, PR+, HER2-)   05/04/2019 - 06/14/2019 Radiation Therapy   The patient initially received a dose of 50 Gy in 25 fractions to the breast using whole-breast tangent fields. This was delivered using a 3-D conformal technique. The patient also received 50 Gy in 25 fractions to the left supraclavicular region. The pt received a boost delivering an additional 10 Gy in 5 fractions using a electron boost with 64mV electrons. The total dose was 110 Gy.   05/2019 -  Anti-estrogen oral therapy    Anastrozole     CHIEF COMPLIANT:  Follow-up of left breast cancer on anastrozole    INTERVAL HISTORY: Katie HEIDEMANis a 76y.o. with above-mentioned history of HER-2 positive left breast cancer treated with neoadjuvant chemotherapy, bilateral mastectomies, radiation, Herceptin maintenance, and is currently on antiestrogen therapy with anastrozole. She presents to the clinic today for follow-up    ALLERGIES:  is allergic to erythromycin, ciprofloxacin, daypro [oxaprozin], enalapril maleate, nsaids, and vioxx [rofecoxib].  MEDICATIONS:  Current Outpatient Medications  Medication Sig Dispense Refill   alendronate (FOSAMAX) 70 MG tablet TAKE 1 TABLET(70 MG) BY MOUTH 1 TIME A WEEK WITH A FULL GLASS OF WATER AND ON AN EMPTY STOMACH 12 tablet 3   anastrozole (ARIMIDEX) 1 MG tablet TAKE 1 TABLET(1 MG) BY MOUTH DAILY 90 tablet 3   aspirin 81 MG EC tablet Take 81 mg by mouth daily. Swallow whole.     atorvastatin (LIPITOR) 20 MG tablet Take 1 tablet (20 mg total) by mouth daily. 90 tablet 3   budesonide (PULMICORT FLEXHALER) 180 MCG/ACT inhaler Inhale 2 puffs into the lungs 2 (two) times daily. 1 each 11   Calcium Carbonate-Vit D-Min (CALTRATE 600+D PLUS MINERALS) 600-800 MG-UNIT TABS Take 1 tablet by mouth in the morning and at bedtime.     clobetasol (TEMOVATE) 0.05 % external solution Apply 1 application topically 2 (two) times daily as needed (scalp). Reported on 08/16/2015     Dulaglutide (TRULICITY) 3 MJM/4.2ASSOPN Inject 3 mg as directed once a week. 6 mL 3   fenofibrate (TRICOR) 145 MG tablet Take 1 tablet (145 mg total)  by mouth daily. 90 tablet 2   fluticasone (FLONASE) 50 MCG/ACT nasal spray SHAKE LIQUID AND USE 2 SPRAYS IN EACH NOSTRIL EVERY DAY 16 g 11   gabapentin (NEURONTIN) 300 MG capsule TAKE 1 CAPSULE(300 MG) BY MOUTH AT BEDTIME 90 capsule 3   glucose blood (FREESTYLE TEST STRIPS) test strip USE TO CHECK BLOOD SUGAR DAILY 50 strip 11   loratadine (CLARITIN) 10 MG tablet Take 10  mg by mouth daily.     losartan-hydrochlorothiazide (HYZAAR) 100-25 MG tablet TAKE 1 TABLET BY MOUTH DAILY 90 tablet 1   Multiple Vitamins-Minerals (CENTRUM SILVER 50+WOMEN) TABS Take 1 tablet by mouth daily.     Polyethyl Glycol-Propyl Glycol (SYSTANE OP) Apply to eye.     Probiotic Product (PROBIOTIC DAILY PO) Take by mouth. Schiff Digestive Advantage     valACYclovir (VALTREX) 500 MG tablet TAKE 1 TABLET(500 MG) BY MOUTH TWICE DAILY 6 tablet 2   No current facility-administered medications for this visit.    PHYSICAL EXAMINATION: ECOG PERFORMANCE STATUS: {CHL ONC ECOG PS:[phone]}  There were no vitals filed for this visit. There were no vitals filed for this visit.  BREAST:*** No palpable masses or nodules in either right or left breasts. No palpable axillary supraclavicular or infraclavicular adenopathy no breast tenderness or nipple discharge. (exam performed in the presence of a chaperone)  LABORATORY DATA:  I have reviewed the data as listed    Latest Ref Rng & Units 05/29/2022    7:38 AM 08/23/2021    1:56 PM 08/01/2021   10:09 AM  CMP  Glucose 70 - 99 mg/dL 147  114    BUN 6 - 23 mg/dL _0 Creatinine 0.40 - 1.20 mg/dL 1.12  1.25  1.07   Sodium 135 - 145 mEq/L 132  136    Potassium 3.5 - 5.1 mEq/L 4.4  3.7    Chloride 96 - 112 mEq/L 98  101    CO2 19 - 32 mEq/L 27  25    Calcium 8.4 - 10.5 mg/dL 10.1  10.7    Total Protein 6.0 - 8.3 g/dL 6.7  7.3    Total Bilirubin 0.2 - 1.2 mg/dL 0.4  0.7    Alkaline Phos 39 - 117 U/L 37  31    AST 0 - 37 U/L 18  30    ALT 0 - 35 U/L 18  28      Lab Results  Component Value Date   WBC 3.1 (L) 05/29/2022   HGB 10.9 (L) 05/29/2022   HCT 31.4 (L) 05/29/2022   MCV 98.5 05/29/2022   PLT 276.0 05/29/2022   NEUTROABS 1.9 05/29/2022    ASSESSMENT & PLAN:  No problem-specific Assessment & Plan notes found for this encounter.    No orders of the defined types were placed in this encounter.  The patient has a good  understanding of the overall plan. she agrees with it. she will call with any problems that may develop before the next visit here. Total time spent: 30 mins including face to face time and time spent for planning, charting and co-ordination of care   Suzzette Righter, [place] 08/06/22    I Rickey Farrier, Nerine Pulse am acting as a Education administrator for [employer]  ***

## 2022-08-07 ENCOUNTER — Inpatient Hospital Stay: Payer: Medicare Other | Attending: Hematology and Oncology | Admitting: Hematology and Oncology

## 2022-08-07 VITALS — BP 140/58 | HR 73 | Temp 97.5°F | Resp 18 | Ht 62.0 in | Wt 166.7 lb

## 2022-08-07 DIAGNOSIS — Z79899 Other long term (current) drug therapy: Secondary | ICD-10-CM | POA: Diagnosis not present

## 2022-08-07 DIAGNOSIS — Z923 Personal history of irradiation: Secondary | ICD-10-CM | POA: Insufficient documentation

## 2022-08-07 DIAGNOSIS — C50412 Malignant neoplasm of upper-outer quadrant of left female breast: Secondary | ICD-10-CM | POA: Diagnosis not present

## 2022-08-07 DIAGNOSIS — Z79811 Long term (current) use of aromatase inhibitors: Secondary | ICD-10-CM | POA: Insufficient documentation

## 2022-08-07 DIAGNOSIS — Z9221 Personal history of antineoplastic chemotherapy: Secondary | ICD-10-CM | POA: Diagnosis not present

## 2022-08-07 DIAGNOSIS — Z170 Estrogen receptor positive status [ER+]: Secondary | ICD-10-CM | POA: Diagnosis not present

## 2022-08-07 DIAGNOSIS — Z9013 Acquired absence of bilateral breasts and nipples: Secondary | ICD-10-CM | POA: Insufficient documentation

## 2022-08-07 NOTE — Assessment & Plan Note (Signed)
With prior history of breast implants that were ruptured/replaced, patient had cellulitis LIQ left breast for 1 week, and UOQ asymmetry 1.5 cm, MRI right breast biopsy benign, left breast numerous masses 7 cm skin thickening: 2 biopsies from left breast: Grade 2-3 IDC ER PR positive, HER-2 positive, Ki-67 50% Clinical stage: T4 N0 M0 stage IIIb   Treatment summary: 1. Neoadjuvant chemotherapy with TCH Perjeta 6 cycles completed 12/30/2018 followed by Herceptin maintenance for 1 year completed 12/01/2019 2. Followed by Bil mastectomies with sentinel lymph node study 03/08/2019: Left mastectomy: Grade 2 IDC spanning 7.5 cm, margins negative, right mastectomy: Benign, ER 95%, PR 5%, HER-2 1+ negative pathology lost the lymph node excision 3. Followed by adjuvant radiation therapy started 05/04/2019-06/14/2019 4.  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 Anastrozole toxicities: None   CT CAP 12/08/2019: Postsurgical and postradiation changes.  Scattered areas of bronchiectasis in the lungs.  Atherosclerosis and diverticulosis.  No evidence of metastatic disease.   Breast cancer surveillance: 1.  Breast exam 08/07/2021: Benign 2. mammogram: No role of imaging studies since she had bilateral mastectomies.   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   Right upper quadrant abdominal pain:08/10/21 CT abdomen and pelvis: Benign Elevated ferritin with low iron saturation: Acute phase reactant.    Return to clinic in 1 year for follow-up 

## 2022-08-15 DIAGNOSIS — D225 Melanocytic nevi of trunk: Secondary | ICD-10-CM | POA: Diagnosis not present

## 2022-08-15 DIAGNOSIS — D2271 Melanocytic nevi of right lower limb, including hip: Secondary | ICD-10-CM | POA: Diagnosis not present

## 2022-08-15 DIAGNOSIS — R208 Other disturbances of skin sensation: Secondary | ICD-10-CM | POA: Diagnosis not present

## 2022-08-15 DIAGNOSIS — L668 Other cicatricial alopecia: Secondary | ICD-10-CM | POA: Diagnosis not present

## 2022-08-15 DIAGNOSIS — D2272 Melanocytic nevi of left lower limb, including hip: Secondary | ICD-10-CM | POA: Diagnosis not present

## 2022-08-15 DIAGNOSIS — L821 Other seborrheic keratosis: Secondary | ICD-10-CM | POA: Diagnosis not present

## 2022-08-15 DIAGNOSIS — L820 Inflamed seborrheic keratosis: Secondary | ICD-10-CM | POA: Diagnosis not present

## 2022-08-15 DIAGNOSIS — D2261 Melanocytic nevi of right upper limb, including shoulder: Secondary | ICD-10-CM | POA: Diagnosis not present

## 2022-08-15 DIAGNOSIS — D2262 Melanocytic nevi of left upper limb, including shoulder: Secondary | ICD-10-CM | POA: Diagnosis not present

## 2022-08-15 DIAGNOSIS — L538 Other specified erythematous conditions: Secondary | ICD-10-CM | POA: Diagnosis not present

## 2022-08-27 ENCOUNTER — Encounter (INDEPENDENT_AMBULATORY_CARE_PROVIDER_SITE_OTHER): Payer: Medicare Other | Admitting: Family Medicine

## 2022-08-27 DIAGNOSIS — U071 COVID-19: Secondary | ICD-10-CM

## 2022-08-27 MED ORDER — MOLNUPIRAVIR EUA 200MG CAPSULE: 4.0000 | ORAL_CAPSULE | Freq: Two times a day (BID) | ORAL | 0 refills | Status: AC

## 2022-08-27 NOTE — Addendum Note (Signed)
Addended by: Eliezer Lofts E on: 08/27/2022 05:19 PM   Modules accepted: Orders

## 2022-08-27 NOTE — Telephone Encounter (Signed)
Please see the MyChart message reply(ies) for my assessment and plan.  The patient gave consent for this Medical Advice Message and is aware that it may result in a bill to their insurance company as well as the possibility that this may result in a co-payment or deductible. They are an established patient, but are not seeking medical advice exclusively about a problem treated during an in person or video visit in the last 7 days. I did not recommend an in person or video visit within 7 days of my reply.  I spent a total of 5 minutes cumulative time within 7 days through [employer] Eliezer Lofts, MD

## 2022-08-28 ENCOUNTER — Other Ambulatory Visit: Payer: Self-pay

## 2022-08-28 MED ORDER — FENOFIBRATE 145 MG PO TABS: 145.0000 mg | ORAL_TABLET | Freq: Every day | ORAL | 0 refills | Status: DC

## 2022-09-02 ENCOUNTER — Ambulatory Visit: Payer: BC Managed Care – PPO | Admitting: Primary Care

## 2022-09-02 ENCOUNTER — Ambulatory Visit: Payer: BC Managed Care – PPO | Admitting: Pulmonary Disease

## 2022-09-06 ENCOUNTER — Encounter: Payer: Self-pay | Admitting: Family Medicine

## 2022-09-06 ENCOUNTER — Ambulatory Visit (INDEPENDENT_AMBULATORY_CARE_PROVIDER_SITE_OTHER): Payer: Medicare Other | Admitting: Family Medicine

## 2022-09-06 VITALS — BP 110/74 | HR 72 | Temp 97.7°F | Ht 62.0 in | Wt 167.0 lb

## 2022-09-06 DIAGNOSIS — M25431 Effusion, right wrist: Secondary | ICD-10-CM | POA: Diagnosis not present

## 2022-09-06 DIAGNOSIS — M778 Other enthesopathies, not elsewhere classified: Secondary | ICD-10-CM | POA: Diagnosis not present

## 2022-09-06 DIAGNOSIS — E113293 Type 2 diabetes mellitus with mild nonproliferative diabetic retinopathy without macular edema, bilateral: Secondary | ICD-10-CM | POA: Diagnosis not present

## 2022-09-06 DIAGNOSIS — S63501D Unspecified sprain of right wrist, subsequent encounter: Secondary | ICD-10-CM | POA: Diagnosis not present

## 2022-09-06 DIAGNOSIS — E1159 Type 2 diabetes mellitus with other circulatory complications: Secondary | ICD-10-CM | POA: Diagnosis not present

## 2022-09-06 DIAGNOSIS — M25539 Pain in unspecified wrist: Secondary | ICD-10-CM | POA: Diagnosis not present

## 2022-09-06 DIAGNOSIS — I152 Hypertension secondary to endocrine disorders: Secondary | ICD-10-CM | POA: Diagnosis not present

## 2022-09-06 LAB — POCT GLYCOSYLATED HEMOGLOBIN (HGB A1C): Hemoglobin A1C: 6.7 % — AB (ref 4.0–5.6)

## 2022-09-06 MED ORDER — TRULICITY 1.5 MG/0.5ML ~~LOC~~ SOAJ: 1.5000 mg | SUBCUTANEOUS | 11 refills | Status: DC

## 2022-09-06 NOTE — Assessment & Plan Note (Signed)
Stable, chronic.  Continue current medication.   Well-controlled on losartan hydrochlorothiazide 100/25 mg p.o. daily.

## 2022-09-06 NOTE — Patient Instructions (Signed)
Decrease Trulicity back to 1.5 given feeling bloated.  Get back on track with healthy eating and regular exercise.

## 2022-09-06 NOTE — Assessment & Plan Note (Addendum)
Good control, but increase in SE on trulicity 3 mg... will decrease back to 1.5 mg weekly. Encouraged exercise, weight loss, healthy eating habits.   Trulicity 1.5 mg weekly.

## 2022-09-06 NOTE — Progress Notes (Signed)
Patient ID: Katie [name], female    DOB: [birth year], 77 y.o.   MRN: [id number]  This visit was conducted in person.  BP 110/74   Pulse 72   Temp 97.7 F (36.5 C) (Oral)   Ht '5\' 2"'$  (1.575 m)   Wt 167 lb (75.8 kg)   LMP 08/26/1992 (Approximate)   SpO2 96%   BMI 30.54 kg/m    CC:  Chief Complaint  Patient presents with   Diabetes    Subjective:   HPI: Katie [name] is a 77 y.o. female presenting on 09/06/2022 for Diabetes  On anastrazole from breast cancer. Make weight loss difficult.  Diabetes: Continued excellent control of diabetes   on Trulicity 3 mg weekly... but increase in SE. Lab Results  Component Value Date   HGBA1C 6.7 (A) 09/06/2022  Using medications without difficulties: Notes more bloating on the 3 mg compared to the 1.5 mg... also does not note much weight loss change Hypoglycemic episodes: none Hyperglycemic episodes: none Feet problems: no ulcers Blood Sugars averaging: FBS 120 eye exam within last year: yes  Wt Readings from Last 3 Encounters:  09/06/22 167 lb (75.8 kg)  08/07/22 166 lb 11.2 oz (75.6 kg)  06/06/22 163 lb 4 oz (74 kg)  Body mass index is 30.54 kg/m.  Exercise: 3-5 days a week in gym  Hypertension:  Well controlled on  Losartan HCTZ. BP Readings from Last 3 Encounters:  09/06/22 110/74  08/07/22 (!) 140/58  06/06/22 122/60  Using medication without problems or lightheadedness:  none Chest pain with exertion:none Edema:none Short of breath:none Average home BPs: Other issues:      Relevant past medical, surgical, family and social history reviewed and updated as indicated. Interim medical history since our last visit reviewed. Allergies and medications reviewed and updated. Outpatient Medications Prior to Visit  Medication Sig Dispense Refill   alendronate (FOSAMAX) 70 MG tablet TAKE 1 TABLET(70 MG) BY MOUTH 1 TIME A WEEK WITH A FULL GLASS OF WATER AND ON AN EMPTY STOMACH 12 tablet 3   anastrozole (ARIMIDEX) 1 MG  tablet TAKE 1 TABLET(1 MG) BY MOUTH DAILY 90 tablet 3   aspirin 81 MG EC tablet Take 81 mg by mouth daily. Swallow whole.     atorvastatin (LIPITOR) 20 MG tablet Take 1 tablet (20 mg total) by mouth daily. 90 tablet 3   budesonide (PULMICORT FLEXHALER) 180 MCG/ACT inhaler Inhale 2 puffs into the lungs 2 (two) times daily. 1 each 11   Calcium Carbonate-Vit D-Min (CALTRATE 600+D PLUS MINERALS) 600-800 MG-UNIT TABS Take 1 tablet by mouth in the morning and at bedtime.     clobetasol (TEMOVATE) 0.05 % external solution Apply 1 application topically 2 (two) times daily as needed (scalp). Reported on 08/16/2015     fenofibrate (TRICOR) 145 MG tablet Take 1 tablet (145 mg total) by mouth daily. PLEASE CALL TO SCHEDULE OFFICE VISIT FOR FURTHER REFILLS. THANK YOU! 30 tablet 0   fluticasone (FLONASE) 50 MCG/ACT nasal spray SHAKE LIQUID AND USE 2 SPRAYS IN EACH NOSTRIL EVERY DAY 16 g 11   gabapentin (NEURONTIN) 300 MG capsule TAKE 1 CAPSULE(300 MG) BY MOUTH AT BEDTIME 90 capsule 3   glucose blood (FREESTYLE TEST STRIPS) test strip USE TO CHECK BLOOD SUGAR DAILY 50 strip 11   loratadine (CLARITIN) 10 MG tablet Take 10 mg by mouth daily.     losartan-hydrochlorothiazide (HYZAAR) 100-25 MG tablet TAKE 1 TABLET BY MOUTH DAILY 90 tablet 1   Multiple Vitamins-Minerals (  CENTRUM SILVER 50+WOMEN) TABS Take 1 tablet by mouth daily.     Polyethyl Glycol-Propyl Glycol (SYSTANE OP) Apply to eye.     Probiotic Product (PROBIOTIC DAILY PO) Take by mouth. Schiff Digestive Advantage     valACYclovir (VALTREX) 500 MG tablet TAKE 1 TABLET(500 MG) BY MOUTH TWICE DAILY 6 tablet 2   Dulaglutide (TRULICITY) 3 DG/3.8VF SOPN Inject 3 mg as directed once a week. 6 mL 3   No facility-administered medications prior to visit.     Per HPI unless specifically indicated in ROS section below Review of Systems  Constitutional:  Negative for fatigue and fever.  HENT:  Negative for congestion.   Eyes:  Negative for pain.  Respiratory:   Negative for cough and shortness of breath.   Cardiovascular:  Negative for chest pain, palpitations and leg swelling.  Gastrointestinal:  Negative for abdominal pain.  Genitourinary:  Negative for dysuria and vaginal bleeding.  Musculoskeletal:  Negative for back pain.  Neurological:  Negative for syncope, light-headedness and headaches.  Psychiatric/Behavioral:  Negative for dysphoric mood.    Objective:  BP 110/74   Pulse 72   Temp 97.7 F (36.5 C) (Oral)   Ht '5\' 2"'$  (1.575 m)   Wt 167 lb (75.8 kg)   LMP 08/26/1992 (Approximate)   SpO2 96%   BMI 30.54 kg/m   Wt Readings from Last 3 Encounters:  09/06/22 167 lb (75.8 kg)  08/07/22 166 lb 11.2 oz (75.6 kg)  06/06/22 163 lb 4 oz (74 kg)      Physical Exam Constitutional:      General: She is not in acute distress.    Appearance: Normal appearance. She is well-developed. She is not ill-appearing or toxic-appearing.  HENT:     Head: Normocephalic.     Right Ear: Hearing, tympanic membrane, ear canal and external ear normal. Tympanic membrane is not erythematous, retracted or bulging.     Left Ear: Hearing, tympanic membrane, ear canal and external ear normal. Tympanic membrane is not erythematous, retracted or bulging.     Nose: No mucosal edema or rhinorrhea.     Right Sinus: No maxillary sinus tenderness or frontal sinus tenderness.     Left Sinus: No maxillary sinus tenderness or frontal sinus tenderness.     Mouth/Throat:     Pharynx: Uvula midline.  Eyes:     General: Lids are normal. Lids are everted, no foreign bodies appreciated.     Conjunctiva/sclera: Conjunctivae normal.     Pupils: Pupils are equal, round, and reactive to light.  Neck:     Thyroid: No thyroid mass or thyromegaly.     Vascular: No carotid bruit.     Trachea: Trachea normal.  Cardiovascular:     Rate and Rhythm: Normal rate and regular rhythm.     Pulses: Normal pulses.     Heart sounds: Normal heart sounds, S1 normal and S2 normal. No murmur  heard.    No friction rub. No gallop.  Pulmonary:     Effort: Pulmonary effort is normal. No tachypnea or respiratory distress.     Breath sounds: Normal breath sounds. No decreased breath sounds, wheezing, rhonchi or rales.  Abdominal:     General: Bowel sounds are normal.     Palpations: Abdomen is soft.     Tenderness: There is no abdominal tenderness.  Musculoskeletal:     Cervical back: Normal range of motion and neck supple.  Skin:    General: Skin is warm and dry.  Findings: No rash.  Neurological:     Mental Status: She is alert.  Psychiatric:        Mood and Affect: Mood is not anxious or depressed.        Speech: Speech normal.        Behavior: Behavior normal. Behavior is cooperative.        Thought Content: Thought content normal.        Judgment: Judgment normal.       Results for orders placed or performed in visit on 09/06/22  POCT glycosylated hemoglobin (Hb A1C)  Result Value Ref Range   Hemoglobin A1C 6.7 (A) 4.0 - 5.6 %   HbA1c POC (<> result, manual entry)     HbA1c, POC (prediabetic range)     HbA1c, POC (controlled diabetic range)       COVID 19 screen:  No recent travel or known exposure to COVID19 The patient denies respiratory symptoms of COVID 19 at this time. The importance of social distancing was discussed today.   Assessment and Plan Problem List Items Addressed This Visit     Controlled type 2 diabetes with retinopathy ([place]) - Primary (Chronic)    Stable, chronic.  Continue current medication.   Trulicity 3 mg weekly.      Relevant Medications   Dulaglutide (TRULICITY) 1.5 BP/1.0CH SOPN   Other Relevant Orders   POCT glycosylated hemoglobin (Hb A1C) (Completed)   Hypertension associated with diabetes ([facility]) (Chronic)    Stable, chronic.  Continue current medication.   Well-controlled on losartan hydrochlorothiazide 100/25 mg p.o. daily.        Relevant Medications   Dulaglutide (TRULICITY) 1.5 EN/2.7PO SOPN       Eliezer Lofts, MD

## 2022-09-20 ENCOUNTER — Telehealth: Payer: Self-pay | Admitting: Family Medicine

## 2022-09-20 MED ORDER — TRULICITY 0.75 MG/0.5ML ~~LOC~~ SOAJ: 1.5000 mg | SUBCUTANEOUS | 11 refills | Status: DC

## 2022-09-20 NOTE — Telephone Encounter (Signed)
Ms. Balin notified as instructed by telephone.  She states she will probably just go down on the dose until they get the 1.5 mg back in stock.

## 2022-09-20 NOTE — Telephone Encounter (Signed)
Let her know I will send in 0.75 mg and she can either go down on dose or take 2 of the injections at a time until they have the higher dose back. New rx sent to pharmacy

## 2022-09-20 NOTE — Telephone Encounter (Signed)
Patient called in and stated that her pharmacy is out of Dulaglutide (TRULICITY) 1.5 JS/4.1HR SOPN but they have .75 MG. She stated that she is completely out and needs it by tomorrow. Please advise. Thank you!

## 2022-09-20 NOTE — Addendum Note (Signed)
Addended by: Eliezer Lofts E on: 09/20/2022 04:52 PM   Modules accepted: Orders

## 2022-09-24 ENCOUNTER — Other Ambulatory Visit: Payer: Self-pay | Admitting: *Deleted

## 2022-09-24 ENCOUNTER — Other Ambulatory Visit: Payer: Self-pay

## 2022-09-24 MED ORDER — FENOFIBRATE 145 MG PO TABS: 145.0000 mg | ORAL_TABLET | Freq: Every day | ORAL | 0 refills | Status: DC

## 2022-09-24 MED ORDER — ATORVASTATIN CALCIUM 20 MG PO TABS: 20.0000 mg | ORAL_TABLET | Freq: Every day | ORAL | 0 refills | Status: DC

## 2022-09-30 ENCOUNTER — Other Ambulatory Visit: Payer: BC Managed Care – PPO

## 2022-10-01 ENCOUNTER — Encounter: Payer: Self-pay | Admitting: Pulmonary Disease

## 2022-10-01 ENCOUNTER — Ambulatory Visit (INDEPENDENT_AMBULATORY_CARE_PROVIDER_SITE_OTHER): Payer: Medicare Other | Admitting: Pulmonary Disease

## 2022-10-01 VITALS — BP 138/82 | HR 66 | Temp 97.5°F | Ht 62.0 in | Wt 168.0 lb

## 2022-10-01 DIAGNOSIS — Z170 Estrogen receptor positive status [ER+]: Secondary | ICD-10-CM | POA: Diagnosis not present

## 2022-10-01 DIAGNOSIS — I272 Pulmonary hypertension, unspecified: Secondary | ICD-10-CM | POA: Diagnosis not present

## 2022-10-01 DIAGNOSIS — G4733 Obstructive sleep apnea (adult) (pediatric): Secondary | ICD-10-CM | POA: Diagnosis not present

## 2022-10-01 DIAGNOSIS — J683 Other acute and subacute respiratory conditions due to chemicals, gases, fumes and vapors: Secondary | ICD-10-CM

## 2022-10-01 DIAGNOSIS — J479 Bronchiectasis, uncomplicated: Secondary | ICD-10-CM | POA: Diagnosis not present

## 2022-10-01 DIAGNOSIS — C50412 Malignant neoplasm of upper-outer quadrant of left female breast: Secondary | ICD-10-CM | POA: Diagnosis not present

## 2022-10-01 NOTE — Patient Instructions (Signed)
We are decreasing your CPAP pressure to 5-15.  We are sending a request for adapt to give you the choice between 2 masks 1 is the AirFit 30i fullface mask versus the ResMed Mirage [place] mask to see which one you like the most.  This may help with some of your leak issues.  We will see you in follow-up in 47-month time call sooner should new problems arise.

## 2022-10-01 NOTE — Progress Notes (Signed)
Subjective:    Patient ID: Katie [name], female    DOB: 1946/04/29, 77 y.o.   MRN: [id number] Patient Care Team: [name], MD as PCP - General (Family Medicine) Kate Sable, MD as PCP - Cardiology (Cardiology) Birder [name], MD as Referring Physician (Ophthalmology) [name], MD as Consulting Physician (Dermatology) [name], MD as Consulting Physician (General Surgery) [name], MD as Consulting Physician (Dentistry) [name], MD (Inactive) as Consulting Physician (General Surgery) [name], MD as Consulting Physician (Hematology and Oncology) [name], MD as Attending Physician (Radiation Oncology) Rolm Bookbinder, MD as Consulting Physician (General Surgery)  Chief Complaint  Patient presents with   Follow-up    No SOB, wheezing or cough. Congestion in the mornings.    HPI Katie [name] is a 77 year old lifelong never smoker, with a history of HER-2 positive breast cancer status post bilateral mastectomies, radiation and neoadjuvant chemotherapy.  She was initially seen here on 26 January 2020 for abnormal chest x-ray and for issues with cough that started after initiation of anastrozole.  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  At that time inhaled corticosteroids were started with relief of her cough.  She was last seen here on 27 March 2022 at that time she continued to do well on inhaled corticosteroids.  With regards to her cough.  She was to travel and we examined the printout of her travel CPAP which appeared to be adequate.  She has also been noted to have mild pulmonary hypertension and this led to home sleep study which showed moderate sleep apnea.  She is currently using auto CPAP at 5 to 20 cm H2O of pressure.  She notes that she continues to do well with this.  She feels that she gets restorative sleep.  Here lately however she has had  difficulties because it appears that the pressure ramps too high, this is associated with mask leak. Her sleep gets disrupted because of this.I reviewed her compliance report and she has been compliant with CPAP despite her recent issues with the mask fitting.     She does not endorse dyspnea.  Cough continues to be controlled. Voices no other active complaint today.  Overall she feels well and looks well   DATA: 09/24/2019 2D echo: LVEF 60 to 65%, normal LV function, indeterminate LV diastolic parameters. Mildly elevated pulmonary artery systolic pressure. 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  Very mild cylindrical bronchiectasis in the lungs bilaterally with some mucus impaction right lower lobe.  Mediastinal adenopathy. 02/17/2020 PFT: Normal spirometry and lung volumes 04/25/2020 HST: Moderate obstructive sleep apnea with AHI of 15.4 and SpO2 low of 76%, patient placed on auto CPAP at 5 to 20 cm H2O 08/31/2021 2D echo: LVEF 60 to 65%, grade I DD, mildly elevated pulmonary artery systolic pressure.  In essence, unchanged from prior   Review of Systems A 10 point review of systems was performed and it is as noted above otherwise negative.  Patient Active Problem List   Diagnosis Date Noted   Overweight (BMI 25.0-29.9) 03/05/2022   Elevated ferritin 03/23/2021   RUQ pain 02/23/2021   Decreased GFR 02/23/2021   Vitamin B 12 deficiency 02/23/2021   Leukopenia 02/23/2021   Bronchiectasis without complication ([place]) 04/88/8916   Aortic atherosclerosis ([place]) 12/23/2019   Atherosclerosis of coronary artery of native heart without angina pectoris 12/23/2019   Peripheral edema 12/22/2019  Chemotherapy-induced peripheral neuropathy ([place]) 10/29/2019   Carotid artery aneurysm ([place]) 01/05/2019   Urine WBC increased 01/05/2019   Anemia 10/15/2018   Port-A-Cath in place 09/29/2018   Diabetic retinopathy ([place]) 09/29/2018   Genetic testing  09/23/2018   Family history of leukemia 09/16/2018   Family history of breast cancer    Malignant neoplasm of upper-outer quadrant of left breast in female, estrogen receptor positive ([place]) 09/11/2018   Family history of breast cancer in sister 08/28/2018   Diverticular disease 10/17/2017   Primary osteoarthritis of right knee 03/07/2017   HSV-1 (herpes simplex virus 1) infection, 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   Vitamin D deficiency 10/11/2016   Family history of thyroid disorder 04/04/2015   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   Counseling regarding end of life decision making 10/04/2014   OA (osteoarthritis) of neck 08/02/2014   History of duodenal ulcer 08/02/2014   Seasonal allergies 08/02/2014   Hypertension associated with diabetes ([place]) 08/02/2014   Hyperlipidemia associated with type 2 diabetes mellitus ([place]) 06/11/5101   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   History of joint replacement 03/01/2014   Controlled type 2 diabetes with retinopathy ([place]) 11/07/2009   Social History   Tobacco Use   Smoking status: Never   Smokeless tobacco: Never  Substance Use Topics   Alcohol use: Not Currently    Alcohol/week: 0.0 standard drinks of alcohol    Comment: rarely   Allergies  Allergen Reactions   Erythromycin Anaphylaxis    REACTION: Rash, hives   Ciprofloxacin Rash   Daypro [Oxaprozin] Rash   Enalapril Maleate Nausea Only and Rash   Nsaids Nausea Only   Vioxx [Rofecoxib] Nausea Only    Current Meds  Medication Sig   alendronate (FOSAMAX) 70 MG tablet TAKE 1 TABLET(70 MG) BY MOUTH 1 TIME A WEEK WITH A FULL GLASS OF WATER AND ON AN EMPTY STOMACH   anastrozole (ARIMIDEX) 1 MG tablet TAKE 1 TABLET(1 MG) BY MOUTH DAILY   aspirin 81 MG EC tablet Take 81 mg by mouth daily. Swallow whole.   atorvastatin (LIPITOR) 20 MG tablet Take 1 tablet (20 mg total) by mouth daily.   budesonide (PULMICORT FLEXHALER) 180 MCG/ACT inhaler Inhale 2 puffs into the lungs 2 (two) times daily.   Calcium  Carbonate-Vit D-Min (CALTRATE 600+D PLUS MINERALS) 600-800 MG-UNIT TABS Take 1 tablet by mouth in the morning and at bedtime.   clobetasol (TEMOVATE) 0.05 % external solution Apply 1 application topically 2 (two) times daily as needed (scalp). Reported on 08/16/2015   Dulaglutide (TRULICITY) 5.85 ID/7.8EU SOPN Inject 1.5 mg into the skin once a week.   fenofibrate (TRICOR) 145 MG tablet Take 1 tablet (145 mg total) by mouth daily.   fluticasone (FLONASE) 50 MCG/ACT nasal spray SHAKE LIQUID AND USE 2 SPRAYS IN EACH NOSTRIL EVERY DAY   gabapentin (NEURONTIN) 300 MG capsule TAKE 1 CAPSULE(300 MG) BY MOUTH AT BEDTIME   glucose blood (FREESTYLE TEST STRIPS) test strip USE TO CHECK BLOOD SUGAR DAILY   loratadine (CLARITIN) 10 MG tablet Take 10 mg by mouth daily.   losartan-hydrochlorothiazide (HYZAAR) 100-25 MG tablet TAKE 1 TABLET BY MOUTH DAILY   Multiple Vitamins-Minerals (CENTRUM SILVER 50+WOMEN) TABS Take 1 tablet by mouth daily.   Polyethyl Glycol-Propyl Glycol (SYSTANE OP) Apply to eye.   Probiotic Product (PROBIOTIC DAILY PO) Take by mouth. [employer] Digestive Advantage   valACYclovir (VALTREX) 500 MG tablet TAKE 1 TABLET(500 MG) BY MOUTH TWICE DAILY   Immunization History  Administered Date(s) Administered   Fluad Quad(high Dose 65+) 04/30/2019,  05/12/2020, 04/06/2022   Hep A / Hep B 05/29/2009, 07/03/2009, 11/24/2009   Hepb-cpg 10/26/2021, 11/28/2021   Influenza, High Dose Seasonal PF 04/18/2014, 04/13/2015, 04/30/2018, 04/10/2021   Influenza, Seasonal, Injecte, Preservative Fre 06/26/2016   Influenza-Unspecified 05/17/2009, 04/02/2011, 05/20/2012, 06/02/2013, 07/01/2013, 04/18/2014, 04/13/2015, 06/27/2016, 04/11/2017   Moderna Covid-19 Vaccine Bivalent Booster 67yr & up 04/06/2022   PFIZER Comirnaty(Gray Top)Covid-19 Tri-Sucrose Vaccine 11/29/2020   PFIZER(Purple Top)SARS-COV-2 Vaccination 10/06/2019, 10/27/2019, 04/24/2020   Pfizer Covid-19 Vaccine Bivalent Booster 154yr& up 05/02/2021    Pneumococcal Conjugate-13 04/18/2014   Pneumococcal Polysaccharide-23 10/06/2015   Rsv, Bivalent, Protein Subunit Rsvpref,pf (AEvans Lance10/05/2022   Td 05/17/2009, 05/12/2020   Typhoid Inactivated 05/17/2009   Unspecified SARS-COV-2 Vaccination 06/04/2022   Yellow Fever 05/17/2009   Zoster Recombinat (Shingrix) 06/07/2021, 08/11/2021   Zoster, Live 04/02/2011      Objective:   Physical Exam BP 138/82 (BP Location: Right Arm, Cuff Size: Normal)   Pulse 66   Temp (!) 97.5 F (36.4 C)   Ht '5\' 2"'$  (1.575 m)   Wt 168 lb (76.2 kg)   LMP 08/26/1992 (Approximate)   SpO2 95%   BMI 30.73 kg/m   SpO2: 95 % O2 Device: None (Room air)  GENERAL: Awake, alert, fully ambulatory, no acute distress.  No conversational dyspnea HEAD: Normocephalic, atraumatic. EYES: Pupils equal, round, reactive to light.  No scleral icterus. MOUTH: Nose/mouth/throat not examined due to masking requirements for COVID 19. NECK: Supple. No thyromegaly. Trachea midline. No JVD.  No adenopathy. PULMONARY: Excellent air entry bilaterally.  Lungs clear to auscultation bilaterally. CARDIOVASCULAR: S1 and S2. Regular rate and rhythm.  No rubs murmurs or gallops heard. GASTROINTESTINAL: Benign. MUSCULOSKELETAL: No joint deformity, no clubbing, no edema. NEUROLOGIC: Awake, alert, no focal deficits.  Speech is fluent.  No gait disturbance noted, fully ambulatory. SKIN: Intact,warm,dry. PSYCH: Mood and behavior appropriate    Assessment & Plan:     ICD-10-CM   1. OSA on CPAP  G47.33 AMB REFERRAL FOR DME   DME referral for a refitting of mask Pressure range of auto CPAP to 5 to 15 cmH2O    2. 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 Pulmonary hypertension (HCC)  I27.20    Mild by echo January 2023 Follows with cardiology    4. Bron[place]ctasis without complication ([place] J4W97.9  Related to postradiation changes Localized     5. Malignant neoplasm of upper-outer quadrant of left breast in female, estrogen receptor positive ([place] C50.412    Z17.0    This issue adds complexity to her management On anastrozole [facility]  Procedures   AMElchoME    Referral Priority:   Routine    Referral Type:   [place]  Number of Visits Requested:   1   We will see the patient in follow-up in 6 months time she is to contact usKorearior to that time should any new difficulties arise.  C.Renold DonMD Advanced Bronchoscopy PCCM [facility]    *This note was dictated using voice recognition software/Dragon.  Despite best efforts to proofread, errors can occur which can change the meaning. Any transcriptional errors that result from this process are unintentional and may not be fully corrected at the time of dictation.

## 2022-10-02 ENCOUNTER — Other Ambulatory Visit: Payer: Self-pay | Admitting: Family Medicine

## 2022-10-04 ENCOUNTER — Encounter: Payer: Self-pay | Admitting: Pulmonary Disease

## 2022-10-04 ENCOUNTER — Ambulatory Visit
Admission: RE | Admit: 2022-10-04 | Discharge: 2022-10-04 | Disposition: A | Discharge status: Home / Self Care | Payer: Medicare Other | Source: Ambulatory Visit | Attending: Family Medicine | Admitting: Family Medicine

## 2022-10-04 DIAGNOSIS — M810 Age-related osteoporosis without current pathological fracture: Secondary | ICD-10-CM | POA: Diagnosis not present

## 2022-10-04 DIAGNOSIS — M8589 Other specified disorders of bone density and structure, multiple sites: Secondary | ICD-10-CM | POA: Diagnosis not present

## 2022-10-04 DIAGNOSIS — Z780 Asymptomatic menopausal state: Secondary | ICD-10-CM | POA: Diagnosis not present

## 2022-10-09 ENCOUNTER — Other Ambulatory Visit
Admission: RE | Admit: 2022-10-09 | Discharge: 2022-10-09 | Disposition: A | Discharge status: Home / Self Care | Payer: Medicare Other | Attending: Cardiology | Admitting: Cardiology

## 2022-10-09 DIAGNOSIS — E782 Mixed hyperlipidemia: Secondary | ICD-10-CM | POA: Insufficient documentation

## 2022-10-09 LAB — LIPID PANEL
Cholesterol: 155 mg/dL (ref 0–200)
HDL: 50 mg/dL (ref >40)
LDL Cholesterol: 71 mg/dL (ref 0–99)
Total CHOL/HDL Ratio: 3.1 RATIO
Triglycerides: 168 mg/dL — ABNORMAL HIGH (ref <150)
VLDL: 34 mg/dL (ref 0–40)

## 2022-10-21 DIAGNOSIS — M25531 Pain in right wrist: Secondary | ICD-10-CM | POA: Diagnosis not present

## 2022-10-21 DIAGNOSIS — I700 Atherosclerosis of aorta: Secondary | ICD-10-CM | POA: Diagnosis not present

## 2022-10-21 DIAGNOSIS — M778 Other enthesopathies, not elsewhere classified: Secondary | ICD-10-CM | POA: Diagnosis not present

## 2022-10-21 DIAGNOSIS — T451X5A Adverse effect of antineoplastic and immunosuppressive drugs, initial encounter: Secondary | ICD-10-CM | POA: Diagnosis not present

## 2022-10-21 DIAGNOSIS — M25431 Effusion, right wrist: Secondary | ICD-10-CM | POA: Diagnosis not present

## 2022-10-21 DIAGNOSIS — G620 Drug-induced polyneuropathy: Secondary | ICD-10-CM | POA: Diagnosis not present

## 2022-10-21 DIAGNOSIS — S83241D Other tear of medial meniscus, current injury, right knee, subsequent encounter: Secondary | ICD-10-CM | POA: Diagnosis not present

## 2022-10-21 DIAGNOSIS — S63501D Unspecified sprain of right wrist, subsequent encounter: Secondary | ICD-10-CM | POA: Diagnosis not present

## 2022-10-25 ENCOUNTER — Other Ambulatory Visit: Payer: Self-pay | Admitting: Student

## 2022-10-25 DIAGNOSIS — M778 Other enthesopathies, not elsewhere classified: Secondary | ICD-10-CM

## 2022-10-25 DIAGNOSIS — S83241D Other tear of medial meniscus, current injury, right knee, subsequent encounter: Secondary | ICD-10-CM

## 2022-10-25 DIAGNOSIS — S63501D Unspecified sprain of right wrist, subsequent encounter: Secondary | ICD-10-CM

## 2022-10-25 DIAGNOSIS — M25431 Effusion, right wrist: Secondary | ICD-10-CM

## 2022-11-02 ENCOUNTER — Ambulatory Visit
Admission: RE | Admit: 2022-11-02 | Discharge: 2022-11-02 | Disposition: A | Discharge status: Home / Self Care | Payer: Medicare Other | Source: Ambulatory Visit | Attending: Student | Admitting: Student

## 2022-11-02 DIAGNOSIS — M778 Other enthesopathies, not elsewhere classified: Secondary | ICD-10-CM

## 2022-11-02 DIAGNOSIS — S83241D Other tear of medial meniscus, current injury, right knee, subsequent encounter: Secondary | ICD-10-CM

## 2022-11-02 DIAGNOSIS — M25431 Effusion, right wrist: Secondary | ICD-10-CM

## 2022-11-02 DIAGNOSIS — S63501D Unspecified sprain of right wrist, subsequent encounter: Secondary | ICD-10-CM

## 2022-11-07 DIAGNOSIS — M778 Other enthesopathies, not elsewhere classified: Secondary | ICD-10-CM | POA: Diagnosis not present

## 2022-11-07 DIAGNOSIS — S83241D Other tear of medial meniscus, current injury, right knee, subsequent encounter: Secondary | ICD-10-CM | POA: Diagnosis not present

## 2022-11-07 DIAGNOSIS — M1711 Unilateral primary osteoarthritis, right knee: Secondary | ICD-10-CM | POA: Diagnosis not present

## 2022-11-07 DIAGNOSIS — M19041 Primary osteoarthritis, right hand: Secondary | ICD-10-CM | POA: Diagnosis not present

## 2022-11-08 ENCOUNTER — Other Ambulatory Visit: Payer: BC Managed Care – PPO

## 2022-11-08 ENCOUNTER — Other Ambulatory Visit: Payer: Self-pay | Admitting: *Deleted

## 2022-11-08 MED ORDER — FENOFIBRATE 145 MG PO TABS: 145.0000 mg | ORAL_TABLET | Freq: Every day | ORAL | 0 refills | Status: DC

## 2022-11-15 ENCOUNTER — Ambulatory Visit: Payer: Medicare Other | Attending: Cardiology | Admitting: Cardiology

## 2022-11-15 ENCOUNTER — Encounter: Payer: Self-pay | Admitting: Cardiology

## 2022-11-15 VITALS — BP 130/60 | HR 62 | Ht 62.0 in | Wt 168.8 lb

## 2022-11-15 DIAGNOSIS — I2510 Atherosclerotic heart disease of native coronary artery without angina pectoris: Secondary | ICD-10-CM

## 2022-11-15 DIAGNOSIS — E782 Mixed hyperlipidemia: Secondary | ICD-10-CM

## 2022-11-15 DIAGNOSIS — I10 Essential (primary) hypertension: Secondary | ICD-10-CM

## 2022-11-15 MED ORDER — FENOFIBRATE 145 MG PO TABS: 145.0000 mg | ORAL_TABLET | Freq: Every day | ORAL | 2 refills | Status: DC

## 2022-11-15 MED ORDER — ATORVASTATIN CALCIUM 20 MG PO TABS: 20.0000 mg | ORAL_TABLET | Freq: Every day | ORAL | 2 refills | Status: DC

## 2022-11-15 NOTE — Patient Instructions (Signed)
Medication Instructions:   Your physician recommends that you continue on your current medications as directed. Please refer to the Current Medication list given to you today.  *If you need a refill on your cardiac medications before your next appointment, please call your pharmacy*   Lab Work:  Your physician recommends that you return for lab work before your 12 month appt at the medical mall. You will need to be fasting.  No appt is needed. Hours are M-F 7AM- 6 PM.  If you have labs (blood work) drawn today and your tests are completely normal, you will receive your results only by: [place] (if you have MyChart) OR A paper copy in the mail If you have any lab test that is abnormal or we need to change your treatment, we will call you to review the results.   Testing/Procedures:  None Ordered   Follow-Up: At [facility], you and your health needs are our priority.  As part of our continuing mission to provide you with exceptional heart care, we have created designated Provider Care Teams.  These Care Teams include your primary Cardiologist (physician) and Advanced Practice Providers (APPs -  Physician Assistants and Nurse Practitioners) who all work together to provide you with the care you need, when you need it.  We recommend signing up for the patient portal called "MyChart".  Sign up information is provided on this After Visit Summary.  MyChart is used to connect with patients for Virtual Visits (Telemedicine).  Patients are able to view lab/test results, encounter notes, upcoming appointments, etc.  Non-urgent messages can be sent to your provider as well.   To learn more about what you can do with MyChart, go to [email or web address].    Your next appointment:   12 month(s)  Provider:   You may see Kate Sable, MD or one of the following Advanced Practice Providers on your designated Care Team:   Murray Hodgkins, NP Christell Faith, PA-C [place]  Kathlen Mody, PA-C Gerrie Nordmann, NP

## 2022-11-15 NOTE — Progress Notes (Signed)
Cardiology Office Note:    Date:  11/15/2022   ID:  Katie [name], DOB 05-10-1946, MRN [id number]  PCP:  [name], MD  Cardiologist:  Kate Sable, MD  Electrophysiologist:  None   Referring MD: [name], MD   Chief Complaint  Patient presents with   Follow-up    12 month f/u, no current cardiac concerns    History of Present Illness:    Katie [name] is a 77 y.o. female with a hx of CAD ( chest CT 11/2019-calcified plaque in [facility]), hypertension, hyperlipidemia, diabetes, breast cancer (s/p bilateral mastectomy, chemo, radiation and immunotherapy) who presents for follow-up.   States feeling well overall, had an episode of left-sided chest pain 6 months ago, followed up with oncology, edema likely from previous radiation from breast cancer therapy.  Has not had any episodes since.  Compliant with medications as prescribed.  Prior notes Echo 08/2021 EF 60 to 65%. Lexiscan Myoview 06/2020 no evidence for ischemia, low risk study patient s/p chemotherapy for breast cancer.  She had a chest CT to evaluate for the presence of any residual disease. Chest CT obtained on 12/08/2019 showed calcified plaque in the left main, LAD and RCA. echocardiogram 08/2019  showed normal systolic function, EF 60 to 65%.  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, small, not treating-watching    Blood transfusion without reported diagnosis    Cancer ([place])    breast 08/2018   Coronary artery disease    Diverticulitis    DJD (degenerative joint disease)    DM type 2 (diabetes mellitus, type 2) ([place])    Family history of breast cancer    Fibrocystic breast disease    GERD (gastroesophageal reflux disease)    History of chicken pox    Hypercholesteremia    Hypertension    Lichen planopilaris    Urinary incontinence    Vertigo 12/2019   1 episode    Past Surgical History:  Procedure Laterality Date   BREAST IMPLANT REMOVAL  Bilateral 03/08/2019   Procedure: Removal of Bilateral Breast Implants;  Surgeon: Rolm Bookbinder, MD;  Location: [place];  Service: General;  Laterality: Bilateral;   BREAST SURGERY  02/1975   Left Breast Biopsy-Fibrocystic Disease   BREAST SURGERY  10/1981   Bialteral Capsulectomy Breast Surgery   BREAST SURGERY  07/1998   Replace Bilateral Silicone Implants   BREAST SURGERY  01/1976   [place] breast surgery to remove fibrocystic tissue and replace with silicone implants   CATARACT EXTRACTION W/PHACO Right 02/08/2020   Procedure: CATARACT EXTRACTION PHACO AND INTRAOCULAR LENS PLACEMENT ([place]) RIGHT DIABETIC;  Surgeon: Birder [name], MD;  Location: [place];  Service: Ophthalmology;  Laterality: Right;  3.55 0:33.7   CATARACT EXTRACTION W/PHACO Left 02/29/2020   Procedure: CATARACT EXTRACTION PHACO AND INTRAOCULAR LENS PLACEMENT ([facility]) LEFT DIABETIC 2.93  00:27.1;  Surgeon: Birder [name], MD;  Location: [place];  Service: Ophthalmology;  Laterality: Left;  Diabetic - oral meds   CHOLECYSTECTOMY N/A 07/08/2016   Procedure: LAPAROSCOPIC CHOLECYSTECTOMY;  Surgeon: [name], MD;  Location: [facility];  Service: General;  Laterality: N/A;   COLONOSCOPY WITH PROPOFOL N/A 11/07/2017   Procedure: COLONOSCOPY WITH PROPOFOL;  Surgeon: Jonathon Bellows, MD;  Location: [facility] ENDOSCOPY;  Service: Gastroenterology;  Laterality: N/A;   DILATION AND CURETTAGE OF UTERUS  01/1994   ESOPHAGOGASTRODUODENOSCOPY (EGD) WITH PROPOFOL N/A 12/05/2016   Procedure:  ESOPHAGOGASTRODUODENOSCOPY (EGD) WITH PROPOFOL;  Surgeon: Jonathon Bellows, MD;  Location: [facility] ENDOSCOPY;  Service: Endoscopy;  Laterality: N/A;   FINGER SURGERY  2001 & 2003   Trigger Finger   FOOT SURGERY  1980   To Relieve Pinched Nerve   FOOT SURGERY  07/2008   Left Foot-Plantar Fasciitis   JOINT REPLACEMENT  04/19/2013   Hip Replacement-Right   MASTECTOMY W/ SENTINEL NODE BIOPSY Bilateral 03/08/2019   Procedure: BILATERAL  MASTECTOMIES WITH LEFT AXILLARY SENTINEL LYMPH NODE BIOPSY AND BLUE DYE INJECTION;  Surgeon: Rolm Bookbinder, MD;  Location: [place];  Service: General;  Laterality: Bilateral;   PARTIAL HIP ARTHROPLASTY Right    PLACEMENT OF BREAST IMPLANTS  12/26/2004   Replace Failed Implants   REPLACEMENT TOTAL KNEE Left    2019-   RHINOPLASTY  03/1964   To Correct Deviated Septum    Current Medications: Current Meds  Medication Sig   alendronate (FOSAMAX) 70 MG tablet TAKE 1 TABLET(70 MG) BY MOUTH 1 TIME A WEEK WITH A FULL GLASS OF WATER AND ON AN EMPTY STOMACH   anastrozole (ARIMIDEX) 1 MG tablet TAKE 1 TABLET(1 MG) BY MOUTH DAILY   aspirin 81 MG EC tablet Take 81 mg by mouth daily. Swallow whole.   budesonide (PULMICORT FLEXHALER) 180 MCG/ACT inhaler Inhale 2 puffs into the lungs 2 (two) times daily.   Calcium Carbonate-Vit D-Min (CALTRATE 600+D PLUS MINERALS) 600-800 MG-UNIT TABS Take 1 tablet by mouth in the morning and at bedtime.   clobetasol (TEMOVATE) 0.05 % external solution Apply 1 application topically 2 (two) times daily as needed (scalp). Reported on 08/16/2015   Dulaglutide (TRULICITY) 1.5 0000000 SOPN ADMINISTER 1.5 MG UNDER THE SKIN 1 TIME A WEEK   fluticasone (FLONASE) 50 MCG/ACT nasal spray SHAKE LIQUID AND USE 2 SPRAYS IN EACH NOSTRIL EVERY DAY   gabapentin (NEURONTIN) 300 MG capsule TAKE 1 CAPSULE(300 MG) BY MOUTH AT BEDTIME   glucose blood (FREESTYLE TEST STRIPS) test strip USE TO CHECK BLOOD SUGAR DAILY   loratadine (CLARITIN) 10 MG tablet Take 10 mg by mouth daily.   losartan-hydrochlorothiazide (HYZAAR) 100-25 MG tablet TAKE 1 TABLET BY MOUTH DAILY   Multiple Vitamins-Minerals (CENTRUM SILVER 50+WOMEN) TABS Take 1 tablet by mouth daily.   Polyethyl Glycol-Propyl Glycol (SYSTANE OP) Apply to eye.   Probiotic Product (PROBIOTIC DAILY PO) Take by mouth. Schiff Digestive Advantage   valACYclovir (VALTREX) 500 MG tablet TAKE 1 TABLET(500 MG) BY MOUTH TWICE DAILY   [DISCONTINUED]  atorvastatin (LIPITOR) 20 MG tablet Take 1 tablet (20 mg total) by mouth daily.   [DISCONTINUED] fenofibrate (TRICOR) 145 MG tablet Take 1 tablet (145 mg total) by mouth daily.     Allergies:   Erythromycin, Ciprofloxacin, Daypro [oxaprozin], Enalapril maleate, Nsaids, and Vioxx [rofecoxib]   Social History   Socioeconomic History   Marital status: Married    Spouse name: Not on file   Number of children: 1   Years of education: Not on file   Highest education level: Not on file  Occupational History   Occupation: Retired  Tobacco Use   Smoking status: Never   Smokeless tobacco: Never  Vaping Use   Vaping Use: Never used  Substance and Sexual Activity   Alcohol use: Not Currently    Alcohol/week: 0.0 standard drinks of alcohol    Comment: rarely   Drug use: No   Sexual activity: Not Currently    Partners: Male  Other Topics Concern   Not on file  Social History Narrative  Married for 37 years.    She has one child and one stepston.      Social Determinants of Health   Financial Resource Strain: Low Risk  (05/02/2020)   Overall Financial Resource Strain (CARDIA)    Difficulty of Paying Living Expenses: Not hard at all  Food Insecurity: No Food Insecurity (05/02/2020)   Hunger Vital Sign    Worried About Running Out of Food in the Last Year: Never true    Ran Out of Food in the Last Year: Never true  Transportation Needs: No Transportation Needs (05/02/2020)   PRAPARE - Hydrologist (Medical): No    Lack of Transportation (Non-Medical): No  Physical Activity: Sufficiently Active (05/02/2020)   Exercise Vital Sign    Days of Exercise per Week: 3 days    Minutes of Exercise per Session: 60 min  Stress: No Stress Concern Present (05/02/2020)   [place]    Feeling of Stress : Not at all  Social Connections: Not on file     Family History: The patient's family history includes  Arthritis in her mother; Breast cancer (age of onset: 71) in her sister; Coronary artery disease in her brother; Diabetes in her father; Heart attack in her brother and father; Heart disease in her father; Hypertension in her brother and mother; Leukemia in her brother.  ROS:   Please see the history of present illness.     All other systems reviewed and are negative.  EKGs/Labs/Other Studies Reviewed:    The following studies were reviewed today:   EKG:  EKG is ordered today.  EKG shows normal sinus rhythm  Recent Labs: 05/29/2022: ALT 18; BUN 20; Creatinine, Ser 1.12; Hemoglobin 10.9; Platelets 276.0; Potassium 4.4; Sodium 132  Recent Lipid Panel    Component Value Date/Time   CHOL 155 10/09/2022 0827   CHOL 169 06/23/2020 0813   TRIG 168 (H) 10/09/2022 0827   HDL 50 10/09/2022 0827   HDL 37 (L) 06/23/2020 0813   CHOLHDL 3.1 10/09/2022 0827   VLDL 34 10/09/2022 0827   LDLCALC 71 10/09/2022 0827   LDLCALC 85 06/23/2020 0813   LDLDIRECT 57.0 05/02/2020 0736    Physical Exam:    VS:  BP 130/60 (BP Location: Left Arm, Patient Position: Sitting, Cuff Size: Normal)   Pulse 62   Ht 5\' 2"  (1.575 m)   Wt 168 lb 12.8 oz (76.6 kg)   LMP 08/26/1992 (Approximate)   SpO2 98%   BMI 30.87 kg/m     Wt Readings from Last 3 Encounters:  11/15/22 168 lb 12.8 oz (76.6 kg)  10/01/22 168 lb (76.2 kg)  09/06/22 167 lb (75.8 kg)     GEN:  Well nourished, well developed in no acute distress HEENT: Normal NECK: No JVD; No carotid bruits LYMPHATICS: No lymphadenopathy CARDIAC: RRR, no murmurs, rubs, gallops RESPIRATORY:  Clear to auscultation without rales, wheezing or rhonchi  ABDOMEN: Soft, non-tender, non-distended MUSCULOSKELETAL:  No edema; No deformity  SKIN: Warm and dry NEUROLOGIC:  Alert and oriented x 3 PSYCHIATRIC:  Normal affect   ASSESSMENT:    1. Coronary artery disease involving native coronary artery of native heart without angina pectoris   2. Primary hypertension    3. Mixed hyperlipidemia     PLAN:    In order of problems listed above:  CAD, chest CT 4/21 with LM, LAD, RCA calcifications..  Echo 08/2021 EF 60 to 65%..  Denies chest  pain.  Continue aspirin 81 mg, Lipitor to 20 mg daily.  Previous Myoview with no ischemia. Hypertension, BP controlled, continue losartan, HCTZ. Mixed hyperlipidemia, cholesterol reasonably controlled given age.  Continue Lipitor 20, fenofibrate.  If triglycerides still elevated at follow-up visit, we will titrate Lipitor.  Follow-up in 12 months   This note was generated in part or whole with voice recognition software. Voice recognition is usually quite accurate but there are transcription errors that can and very often do occur. I apologize for any typographical errors that were not detected and corrected.  Medication Adjustments/Labs and Tests Ordered: Current medicines are reviewed at length with the patient today.  Concerns regarding medicines are outlined above.  Orders Placed This Encounter  Procedures   Lipid panel   EKG 12-Lead   Meds ordered this encounter  Medications   atorvastatin (LIPITOR) 20 MG tablet    Sig: Take 1 tablet (20 mg total) by mouth daily.    Dispense:  90 tablet    Refill:  2   fenofibrate (TRICOR) 145 MG tablet    Sig: Take 1 tablet (145 mg total) by mouth daily.    Dispense:  90 tablet    Refill:  2    Pt should keep future appointment for long term refills, Thank you!    Patient Instructions  Medication Instructions:   Your physician recommends that you continue on your current medications as directed. Please refer to the Current Medication list given to you today.  *If you need a refill on your cardiac medications before your next appointment, please call your pharmacy*   Lab Work:  Your physician recommends that you return for lab work before your 12 month appt at the medical mall. You will need to be fasting.  No appt is needed. Hours are M-F 7AM- 6 PM.  If you have  labs (blood work) drawn today and your tests are completely normal, you will receive your results only by: [place] (if you have MyChart) OR A paper copy in the mail If you have any lab test that is abnormal or we need to change your treatment, we will call you to review the results.   Testing/Procedures:  None Ordered   Follow-Up: At [facility], you and your health needs are our priority.  As part of our continuing mission to provide you with exceptional heart care, we have created designated Provider Care Teams.  These Care Teams include your primary Cardiologist (physician) and Advanced Practice Providers (APPs -  Physician Assistants and Nurse Practitioners) who all work together to provide you with the care you need, when you need it.  We recommend signing up for the patient portal called "MyChart".  Sign up information is provided on this After Visit Summary.  MyChart is used to connect with patients for Virtual Visits (Telemedicine).  Patients are able to view lab/test results, encounter notes, upcoming appointments, etc.  Non-urgent messages can be sent to your provider as well.   To learn more about what you can do with MyChart, go to [email or web address].    Your next appointment:   12 month(s)  Provider:   You may see Kate Sable, MD or one of the following Advanced Practice Providers on your designated Care Team:   Murray Hodgkins, NP Christell Faith, PA-C Cadence Kathlen Mody, PA-C Gerrie Nordmann, NP    Signed, Kate Sable, MD  11/15/2022 9:26 AM    [place]

## 2022-11-18 DIAGNOSIS — H43813 Vitreous degeneration, bilateral: Secondary | ICD-10-CM | POA: Diagnosis not present

## 2022-11-18 DIAGNOSIS — E113393 Type 2 diabetes mellitus with moderate nonproliferative diabetic retinopathy without macular edema, bilateral: Secondary | ICD-10-CM | POA: Diagnosis not present

## 2022-11-18 DIAGNOSIS — Z961 Presence of intraocular lens: Secondary | ICD-10-CM | POA: Diagnosis not present

## 2022-11-18 DIAGNOSIS — M3501 Sicca syndrome with keratoconjunctivitis: Secondary | ICD-10-CM | POA: Diagnosis not present

## 2022-11-18 LAB — HM DIABETES EYE EXAM

## 2022-11-26 ENCOUNTER — Other Ambulatory Visit: Payer: Self-pay | Admitting: Family Medicine

## 2022-11-27 NOTE — Telephone Encounter (Signed)
Last office visit 09/06/2022 for DM.  Last refilled 10/15/2021 for #6 with 2 refills.  Next appt: 12/06/2022 for DM.

## 2022-11-30 ENCOUNTER — Encounter: Payer: Self-pay | Admitting: Pulmonary Disease

## 2022-12-05 ENCOUNTER — Other Ambulatory Visit: Payer: Self-pay | Admitting: Family Medicine

## 2022-12-06 ENCOUNTER — Encounter: Payer: Self-pay | Admitting: Family Medicine

## 2022-12-06 ENCOUNTER — Ambulatory Visit (INDEPENDENT_AMBULATORY_CARE_PROVIDER_SITE_OTHER): Payer: Medicare Other | Admitting: Family Medicine

## 2022-12-06 VITALS — BP 120/60 | HR 68 | Temp 97.9°F | Ht 62.0 in | Wt 170.2 lb

## 2022-12-06 DIAGNOSIS — E785 Hyperlipidemia, unspecified: Secondary | ICD-10-CM

## 2022-12-06 DIAGNOSIS — E113293 Type 2 diabetes mellitus with mild nonproliferative diabetic retinopathy without macular edema, bilateral: Secondary | ICD-10-CM | POA: Diagnosis not present

## 2022-12-06 DIAGNOSIS — E1169 Type 2 diabetes mellitus with other specified complication: Secondary | ICD-10-CM | POA: Diagnosis not present

## 2022-12-06 DIAGNOSIS — I152 Hypertension secondary to endocrine disorders: Secondary | ICD-10-CM | POA: Diagnosis not present

## 2022-12-06 DIAGNOSIS — E1159 Type 2 diabetes mellitus with other circulatory complications: Secondary | ICD-10-CM

## 2022-12-06 LAB — POCT GLYCOSYLATED HEMOGLOBIN (HGB A1C): Hemoglobin A1C: 6.7 % — AB (ref 4.0–5.6)

## 2022-12-06 MED ORDER — TRULICITY 0.75 MG/0.5ML ~~LOC~~ SOAJ: 0.7500 mg | SUBCUTANEOUS | 5 refills | Status: DC

## 2022-12-06 NOTE — Patient Instructions (Signed)
Continue current dose of Trulicity but if 1.5 mg weekly available change to that dose.

## 2022-12-06 NOTE — Progress Notes (Signed)
Patient ID: Katie [name], female    DOB: March 30, 1946, 77 y.o.   MRN: [id number]  This visit was conducted in person.  BP 120/60   Pulse 68   Temp 97.9 F (36.6 C) (Temporal)   Ht  (1.575 m)   Wt 170 lb 4 oz (77.2 kg)   LMP 08/26/1992 (Approximate)   SpO2 97%   BMI 31.14 kg/m    CC:  Chief Complaint  Patient presents with   Diabetes    Subjective:   HPI: Katie [name] is a 77 y.o. female presenting on 12/06/2022 for Diabetes   Right knee medial meniscus tear.. has decreased her activity.. Dr. Leota Sauers..plans surgical repair in 12/31/2022  On anastrazole from breast cancer. Making weight loss difficult.  Diabetes: Continued excellent control of diabetes   on Trulicity  1.5 mg weekly... but she has been unable to find this... she has instead been taking for the majority of time  0.75 mg weekly Lab Results  Component Value Date   HGBA1C 6.7 (A) 12/06/2022  Using medications without difficulties: Notes more bloating on the 3 mg compared to the 1.5 mg... also does not note much weight loss change Hypoglycemic episodes: none Hyperglycemic episodes: none Feet problems: no ulcers Blood Sugars averaging: FBS 120 eye exam within last year: yes  Wt Readings from Last 3 Encounters:  12/06/22 170 lb 4 oz (77.2 kg)  11/15/22 168 lb 12.8 oz (76.6 kg)  10/01/22 168 lb (76.2 kg)  Body mass index is 31.14 kg/m.  Exercise: 3-5 days a week in gym  Hypertension:  Well controlled on losartan HCTZ. BP Readings from Last 3 Encounters:  12/06/22 120/60  11/15/22 130/60  10/01/22 138/82  Using medication without problems or lightheadedness:  none Chest pain with exertion:none Edema:none Short of breath:none Average home BPs: Other issues:   Reviewed last Cardiology note from 11/15/2022    Relevant past medical, surgical, family and social history reviewed and updated as indicated. Interim medical history since our last visit reviewed. Allergies and medications  reviewed and updated. Outpatient Medications Prior to Visit  Medication Sig Dispense Refill   alendronate (FOSAMAX) 70 MG tablet TAKE 1 TABLET(70 MG) BY MOUTH 1 TIME A WEEK WITH A FULL GLASS OF WATER AND ON AN EMPTY STOMACH 12 tablet 3   anastrozole (ARIMIDEX) 1 MG tablet TAKE 1 TABLET(1 MG) BY MOUTH DAILY 90 tablet 3   aspirin 81 MG EC tablet Take 81 mg by mouth daily. Swallow whole.     atorvastatin (LIPITOR) 20 MG tablet Take 1 tablet (20 mg total) by mouth daily. 90 tablet 2   budesonide (PULMICORT FLEXHALER) 180 MCG/ACT inhaler Inhale 2 puffs into the lungs 2 (two) times daily. 1 each 11   Calcium Carbonate-Vit D-Min (CALTRATE 600+D PLUS MINERALS) 600-800 MG-UNIT TABS Take 1 tablet by mouth in the morning and at bedtime.     clobetasol (TEMOVATE) 0.05 % external solution Apply 1 application topically 2 (two) times daily as needed (scalp). Reported on 08/16/2015     fenofibrate (TRICOR) 145 MG tablet Take 1 tablet (145 mg total) by mouth daily. 90 tablet 2   fluticasone (FLONASE) 50 MCG/ACT nasal spray SHAKE LIQUID AND USE 2 SPRAYS IN EACH NOSTRIL EVERY DAY 16 g 11   gabapentin (NEURONTIN) 300 MG capsule TAKE 1 CAPSULE(300 MG) BY MOUTH AT BEDTIME 90 capsule 3   glucose blood (FREESTYLE TEST STRIPS) test strip USE TO CHECK BLOOD SUGAR DAILY 50 strip 11   loratadine (  CLARITIN) 10 MG tablet Take 10 mg by mouth daily.     losartan-hydrochlorothiazide (HYZAAR) 100-25 MG tablet TAKE 1 TABLET BY MOUTH DAILY 90 tablet 1   Multiple Vitamins-Minerals (CENTRUM SILVER 50+WOMEN) TABS Take 1 tablet by mouth daily.     Polyethyl Glycol-Propyl Glycol (SYSTANE OP) Apply to eye.     Probiotic Product (PROBIOTIC DAILY PO) Take by mouth. Schiff Digestive Advantage     valACYclovir (VALTREX) 500 MG tablet TAKE 1 TABLET(500 MG) BY MOUTH TWICE DAILY 6 tablet 2   TRULICITY 0.75 MG/0.5ML SOPN Inject 0.75 mg as directed once a week.     Dulaglutide (TRULICITY) 1.5 MG/0.5ML SOPN ADMINISTER 1.5 MG UNDER THE SKIN 1 TIME  A WEEK (Patient not taking: Reported on 12/06/2022) 3 mL 5   No facility-administered medications prior to visit.     Per HPI unless specifically indicated in ROS section below Review of Systems  Constitutional:  Negative for fatigue and fever.  HENT:  Negative for congestion.   Eyes:  Negative for pain.  Respiratory:  Negative for cough and shortness of breath.   Cardiovascular:  Negative for chest pain, palpitations and leg swelling.  Gastrointestinal:  Negative for abdominal pain.  Genitourinary:  Negative for dysuria and vaginal bleeding.  Musculoskeletal:  Negative for back pain.  Neurological:  Negative for syncope, light-headedness and headaches.  Psychiatric/Behavioral:  Negative for dysphoric mood.    Objective:  BP 120/60   Pulse 68   Temp 97.9 F (36.6 C) (Temporal)   Ht  (1.575 m)   Wt 170 lb 4 oz (77.2 kg)   LMP 08/26/1992 (Approximate)   SpO2 97%   BMI 31.14 kg/m   Wt Readings from Last 3 Encounters:  12/06/22 170 lb 4 oz (77.2 kg)  11/15/22 168 lb 12.8 oz (76.6 kg)  10/01/22 168 lb (76.2 kg)      Physical Exam Constitutional:      General: She is not in acute distress.    Appearance: Normal appearance. She is well-developed. She is not ill-appearing or toxic-appearing.  HENT:     Head: Normocephalic.     Right Ear: Hearing, tympanic membrane, ear canal and external ear normal. Tympanic membrane is not erythematous, retracted or bulging.     Left Ear: Hearing, tympanic membrane, ear canal and external ear normal. Tympanic membrane is not erythematous, retracted or bulging.     Nose: No mucosal edema or rhinorrhea.     Right Sinus: No maxillary sinus tenderness or frontal sinus tenderness.     Left Sinus: No maxillary sinus tenderness or frontal sinus tenderness.     Mouth/Throat:     Pharynx: Uvula midline.  Eyes:     General: Lids are normal. Lids are everted, no foreign bodies appreciated.     Conjunctiva/sclera: Conjunctivae normal.     Pupils:  Pupils are equal, round, and reactive to light.  Neck:     Thyroid: No thyroid mass or thyromegaly.     Vascular: No carotid bruit.     Trachea: Trachea normal.  Cardiovascular:     Rate and Rhythm: Normal rate and regular rhythm.     Pulses: Normal pulses.     Heart sounds: Normal heart sounds, S1 normal and S2 normal. No murmur heard.    No friction rub. No gallop.  Pulmonary:     Effort: Pulmonary effort is normal. No tachypnea or respiratory distress.     Breath sounds: Normal breath sounds. No decreased breath sounds, wheezing, rhonchi or rales.  Abdominal:     General: Bowel sounds are normal.     Palpations: Abdomen is soft.     Tenderness: There is no abdominal tenderness.  Musculoskeletal:     Cervical back: Normal range of motion and neck supple.  Skin:    General: Skin is warm and dry.     Findings: No rash.  Neurological:     Mental Status: She is alert.  Psychiatric:        Mood and Affect: Mood is not anxious or depressed.        Speech: Speech normal.        Behavior: Behavior normal. Behavior is cooperative.        Thought Content: Thought content normal.        Judgment: Judgment normal.       Results for orders placed or performed in visit on 12/06/22  POCT glycosylated hemoglobin (Hb A1C)  Result Value Ref Range   Hemoglobin A1C 6.7 (A) 4.0 - 5.6 %   HbA1c POC (<> result, manual entry)     HbA1c, POC (prediabetic range)     HbA1c, POC (controlled diabetic range)       COVID 19 screen:  No recent travel or known exposure to COVID19 The patient denies respiratory symptoms of COVID 19 at this time. The importance of social distancing was discussed today.   Assessment and Plan Problem List Items Addressed This Visit     Controlled type 2 diabetes with retinopathy - Primary (Chronic)    Chronic, associated with retinopathy, good control, but unable to find 1.5 mg weekly... now on 0.75 mg. Working well to control diabetes, but no decrease in appetite.   If 1.5 is available she will change to this.  Encouraged exercise, weight loss, healthy eating habits.   Trulicity 0.75 mg weekly.      Relevant Medications   TRULICITY 0.75 MG/0.5ML SOPN   Other Relevant Orders   POCT glycosylated hemoglobin (Hb A1C) (Completed)   Diabetic retinopathy    Associated with diabetes. Yearly ophthalmology exam completed.      Relevant Medications   TRULICITY 0.75 MG/0.5ML SOPN   Hyperlipidemia associated with type 2 diabetes mellitus (HCC)    Reviewed labs from cardiology.. LDL at goal  trig slightly high. Stable, chronic.  Continue current medication.   Atorvastatin 20 mg daily.      Relevant Medications   TRULICITY 0.75 MG/0.5ML SOPN   Hypertension associated with diabetes (Chronic)    Stable, chronic.  Continue current medication.   Well-controlled on losartan hydrochlorothiazide 100/25 mg p.o. daily.        Relevant Medications   TRULICITY 0.75 MG/0.5ML SOPN       Kerby Nora, MD

## 2022-12-06 NOTE — Assessment & Plan Note (Signed)
Stable, chronic.  Continue current medication.   Well-controlled on losartan hydrochlorothiazide 100/25 mg p.o. daily.   

## 2022-12-06 NOTE — Assessment & Plan Note (Signed)
Reviewed labs from cardiology.. LDL at goal  trig slightly high. Stable, chronic.  Continue current medication.   Atorvastatin 20 mg daily.

## 2022-12-06 NOTE — Assessment & Plan Note (Addendum)
Chronic, associated with retinopathy, good control, but unable to find 1.5 mg weekly... now on 0.75 mg. Working well to control diabetes, but no decrease in appetite.  If 1.5 is available she will change to this.  Encouraged exercise, weight loss, healthy eating habits.   Trulicity 0.75 mg weekly.

## 2022-12-06 NOTE — Assessment & Plan Note (Signed)
Associated with diabetes. Yearly ophthalmology exam completed. 

## 2022-12-15 ENCOUNTER — Other Ambulatory Visit: Payer: Self-pay | Admitting: Pulmonary Disease

## 2022-12-17 ENCOUNTER — Encounter: Payer: Self-pay | Admitting: Family Medicine

## 2022-12-17 ENCOUNTER — Ambulatory Visit (INDEPENDENT_AMBULATORY_CARE_PROVIDER_SITE_OTHER): Payer: Medicare Other | Admitting: Family Medicine

## 2022-12-17 VITALS — BP 130/60 | HR 76 | Temp 98.2°F | Ht 62.0 in | Wt 170.0 lb

## 2022-12-17 DIAGNOSIS — Z170 Estrogen receptor positive status [ER+]: Secondary | ICD-10-CM

## 2022-12-17 DIAGNOSIS — C50412 Malignant neoplasm of upper-outer quadrant of left female breast: Secondary | ICD-10-CM

## 2022-12-17 DIAGNOSIS — R21 Rash and other nonspecific skin eruption: Secondary | ICD-10-CM | POA: Diagnosis not present

## 2022-12-17 LAB — CBC WITH DIFFERENTIAL/PLATELET
Basophils Absolute: 0 10*3/uL (ref 0.0–0.1)
Basophils Relative: 1 % (ref 0.0–3.0)
Eosinophils Absolute: 0.1 10*3/uL (ref 0.0–0.7)
Eosinophils Relative: 2.7 % (ref 0.0–5.0)
HCT: 32.5 % — ABNORMAL LOW (ref 36.0–46.0)
Hemoglobin: 11.4 g/dL — ABNORMAL LOW (ref 12.0–15.0)
Lymphocytes Relative: 16.3 % (ref 12.0–46.0)
Lymphs Abs: 0.5 10*3/uL — ABNORMAL LOW (ref 0.7–4.0)
MCHC: 34.9 g/dL (ref 30.0–36.0)
MCV: 100.6 fl — ABNORMAL HIGH (ref 78.0–100.0)
Monocytes Absolute: 0.5 10*3/uL (ref 0.1–1.0)
Monocytes Relative: 16.9 % — ABNORMAL HIGH (ref 3.0–12.0)
Neutro Abs: 1.9 10*3/uL (ref 1.4–7.7)
Neutrophils Relative %: 63.1 % (ref 43.0–77.0)
Platelets: 229 10*3/uL (ref 150.0–400.0)
RBC: 3.23 Mil/uL — ABNORMAL LOW (ref 3.87–5.11)
RDW: 14.1 % (ref 11.5–15.5)
WBC: 3 10*3/uL — ABNORMAL LOW (ref 4.0–10.5)

## 2022-12-17 MED ORDER — CEPHALEXIN 500 MG PO CAPS: 500.0000 mg | ORAL_CAPSULE | Freq: Three times a day (TID) | ORAL | 0 refills | Status: DC

## 2022-12-17 NOTE — Assessment & Plan Note (Signed)
Acute, chronic postmastectomy skin changes now with superimposed mild erythema and nonblanching petechiae.  No new exposure.  Known itchy... Rash not clearly suggestive of allergic dermatitis. Will check CBC and cover for possible bacterial infection with Keflex 3 times daily x 7 days. Rash appears more likely vasculitis/vascular injury but there is no rash anywhere else on her body.  She does report chronic discomfort from tightening skin in that area long-term, always had some pain with stretching skin.?  Possible pulling injury to skin.  No underlying new masses, skin changes or pain reported per patient or palpated on exam. Will request consult from her oncologist Dr. Pamelia Hoit and her surgeon Dr. Dwain Sarna.

## 2022-12-17 NOTE — Progress Notes (Signed)
Patient ID: Katie [name], female    DOB: [birth year], 77 y.o.   MRN: [id number]  This visit was conducted in person.  BP 130/60   Pulse 76   Temp 98.2 F (36.8 C) (Temporal)   Ht  (1.575 m)   Wt 170 lb (77.1 kg)   LMP 08/26/1992 (Approximate)   SpO2 94%   BMI 31.09 kg/m    CC:  Chief Complaint  Patient presents with   Rash    Left Breast    Subjective:   HPI: AKITA MAXIM is a 77 y.o. female presenting on 12/17/2022 for Rash (Left Breast)  Hx of left breast cancer actively treated on anastrozole, s/p  bilateral mastectomy, lymphadenectomy. chemo and radiation  in 2020   New onset rash on left breast ongoing 5 days  Redness around past surgical site, under arm not itchy, slightly sore. No pustules, no vesicle.   No spreading. Has always been stiff skin over that area likely from radiation... occ feels like catches on left axillae.     No flu like symptoms, no fever.  No arm swelling.  She has tried benadryl... may have helped some. She has applied neosporin.   Has annual check with surgeon on 5/29   No new exposures, no new deodorant/lotions,  going to gym but doing regular rpoutine., no heany lifting   Relevant past medical, surgical, family and social history reviewed and updated as indicated. Interim medical history since our last visit reviewed. Allergies and medications reviewed and updated. Outpatient Medications Prior to Visit  Medication Sig Dispense Refill   alendronate (FOSAMAX) 70 MG tablet TAKE 1 TABLET(70 MG) BY MOUTH 1 TIME A WEEK WITH A FULL GLASS OF WATER AND ON AN EMPTY STOMACH 12 tablet 3   anastrozole (ARIMIDEX) 1 MG tablet TAKE 1 TABLET(1 MG) BY MOUTH DAILY 90 tablet 3   aspirin 81 MG EC tablet Take 81 mg by mouth daily. Swallow whole.     atorvastatin (LIPITOR) 20 MG tablet Take 1 tablet (20 mg total) by mouth daily. 90 tablet 2   Calcium Carbonate-Vit D-Min (CALTRATE 600+D PLUS MINERALS) 600-800 MG-UNIT TABS Take 1 tablet by mouth in  the morning and at bedtime.     clobetasol (TEMOVATE) 0.05 % external solution Apply 1 application topically 2 (two) times daily as needed (scalp). Reported on 08/16/2015     fenofibrate (TRICOR) 145 MG tablet Take 1 tablet (145 mg total) by mouth daily. 90 tablet 2   fluticasone (FLONASE) 50 MCG/ACT nasal spray SHAKE LIQUID AND USE 2 SPRAYS IN EACH NOSTRIL EVERY DAY 16 g 11   gabapentin (NEURONTIN) 300 MG capsule TAKE 1 CAPSULE(300 MG) BY MOUTH AT BEDTIME 90 capsule 3   glucose blood (FREESTYLE TEST STRIPS) test strip USE TO CHECK BLOOD SUGAR DAILY 50 strip 11   loratadine (CLARITIN) 10 MG tablet Take 10 mg by mouth daily.     losartan-hydrochlorothiazide (HYZAAR) 100-25 MG tablet TAKE 1 TABLET BY MOUTH DAILY 90 tablet 1   Multiple Vitamins-Minerals (CENTRUM SILVER 50+WOMEN) TABS Take 1 tablet by mouth daily.     Polyethyl Glycol-Propyl Glycol (SYSTANE OP) Apply to eye.     Probiotic Product (PROBIOTIC DAILY PO) Take by mouth. [employer] Digestive Advantage     PULMICORT FLEXHALER 180 MCG/ACT inhaler INHALE 2 PUFFS INTO THE LUNGS TWICE DAILY 1 each 11   TRULICITY 0.75 MG/0.5ML SOPN Inject 0.75 mg as directed once a week. 2 mL 5   valACYclovir (VALTREX) 500 MG  tablet TAKE 1 TABLET(500 MG) BY MOUTH TWICE DAILY 6 tablet 2   No facility-administered medications prior to visit.     Per HPI unless specifically indicated in ROS section below Review of Systems  Constitutional:  Negative for fatigue and fever.  HENT:  Negative for congestion.   Eyes:  Negative for pain.  Respiratory:  Negative for cough and shortness of breath.   Cardiovascular:  Negative for chest pain, palpitations and leg swelling.  Gastrointestinal:  Negative for abdominal pain.  Genitourinary:  Negative for dysuria and vaginal bleeding.  Musculoskeletal:  Negative for back pain.  Neurological:  Negative for syncope, light-headedness and headaches.  Psychiatric/Behavioral:  Negative for dysphoric mood.    Objective:  BP  130/60   Pulse 76   Temp 98.2 F (36.8 C) (Temporal)   Ht 5\' 2"  (1.575 m)   Wt 170 lb (77.1 kg)   LMP 08/26/1992 (Approximate)   SpO2 94%   BMI 31.09 kg/m   Wt Readings from Last 3 Encounters:  12/17/22 170 lb (77.1 kg)  12/06/22 170 lb 4 oz (77.2 kg)  11/15/22 168 lb 12.8 oz (76.6 kg)      Physical Exam Constitutional:      General: She is not in acute distress.    Appearance: Normal appearance. She is well-developed. She is not ill-appearing or toxic-appearing.  HENT:     Head: Normocephalic.     Right Ear: Hearing, tympanic membrane, ear canal and external ear normal. Tympanic membrane is not erythematous, retracted or bulging.     Left Ear: Hearing, tympanic membrane, ear canal and external ear normal. Tympanic membrane is not erythematous, retracted or bulging.     Nose: No mucosal edema or rhinorrhea.     Right Sinus: No maxillary sinus tenderness or frontal sinus tenderness.     Left Sinus: No maxillary sinus tenderness or frontal sinus tenderness.     Mouth/Throat:     Pharynx: Uvula midline.  Eyes:     General: Lids are normal. Lids are everted, no foreign bodies appreciated.     Conjunctiva/sclera: Conjunctivae normal.     Pupils: Pupils are equal, round, and reactive to light.  Neck:     Thyroid: No thyroid mass or thyromegaly.     Vascular: No carotid bruit.     Trachea: Trachea normal.  Cardiovascular:     Rate and Rhythm: Normal rate and regular rhythm.     Pulses: Normal pulses.     Heart sounds: Normal heart sounds, S1 normal and S2 normal. No murmur heard.    No friction rub. No gallop.  Pulmonary:     Effort: Pulmonary effort is normal. No tachypnea or respiratory distress.     Breath sounds: Normal breath sounds. No decreased breath sounds, wheezing, rhonchi or rales.  Chest:     Comments:  Chronic post mastectomy changes bilaterally... no new masses palpated but chronic skin thickening Abdominal:     General: Bowel sounds are normal.      Palpations: Abdomen is soft.     Tenderness: There is no abdominal tenderness.  Musculoskeletal:     Cervical back: Normal range of motion and neck supple.  Skin:    General: Skin is warm and dry.     Findings: No rash.     Comments: See pictures  Rash has slight erythematous background blanching with superimposed non-blanching petechia like changes  Neurological:     Mental Status: She is alert.  Psychiatric:  Mood and Affect: Mood is not anxious or depressed.        Speech: Speech normal.        Behavior: Behavior normal. Behavior is cooperative.        Thought Content: Thought content normal.        Judgment: Judgment normal.          Results for orders placed or performed in visit on 12/06/22  POCT glycosylated hemoglobin (Hb A1C)  Result Value Ref Range   Hemoglobin A1C 6.7 (A) 4.0 - 5.6 %   HbA1c POC (<> result, manual entry)     HbA1c, POC (prediabetic range)     HbA1c, POC (controlled diabetic range)      Assessment and Plan  Skin rash Assessment & Plan:  Acute, chronic postmastectomy skin changes now with superimposed mild erythema and nonblanching petechiae.  No new exposure.  Known itchy... Rash not clearly suggestive of allergic dermatitis. Will check CBC and cover for possible bacterial infection with Keflex 3 times daily x 7 days. Rash appears more likely vasculitis/vascular injury but there is no rash anywhere else on her body.  She does report chronic discomfort from tightening skin in that area long-term, always had some pain with stretching skin.?  Possible pulling injury to skin.  No underlying new masses, skin changes or pain reported per patient or palpated on exam. Will request consult from her oncologist Dr. Pamelia Hoit and her surgeon Dr. Dwain Sarna.  Orders: -     CBC with Differential/Platelet  Malignant neoplasm of upper-outer quadrant of left breast in female, estrogen receptor positive  Other orders -     Cephalexin; Take 1 capsule (500  mg total) by mouth 3 (three) times daily.  Dispense: 21 capsule; Refill: 0    No follow-ups on file.   Kerby Nora, MD

## 2022-12-18 ENCOUNTER — Encounter: Payer: Self-pay | Admitting: Family Medicine

## 2022-12-20 ENCOUNTER — Other Ambulatory Visit: Payer: Self-pay | Admitting: Surgery

## 2022-12-20 DIAGNOSIS — L598 Other specified disorders of the skin and subcutaneous tissue related to radiation: Secondary | ICD-10-CM | POA: Diagnosis not present

## 2022-12-20 DIAGNOSIS — D485 Neoplasm of uncertain behavior of skin: Secondary | ICD-10-CM | POA: Diagnosis not present

## 2022-12-23 ENCOUNTER — Other Ambulatory Visit: Payer: Self-pay

## 2022-12-23 ENCOUNTER — Encounter
Admission: RE | Admit: 2022-12-23 | Discharge: 2022-12-23 | Disposition: A | Discharge status: Home / Self Care | Payer: Medicare Other | Source: Ambulatory Visit | Attending: Surgery | Admitting: Surgery

## 2022-12-23 VITALS — BP 152/53 | Resp 16 | Ht 62.0 in | Wt 169.5 lb

## 2022-12-23 DIAGNOSIS — Z01812 Encounter for preprocedural laboratory examination: Secondary | ICD-10-CM

## 2022-12-23 HISTORY — DX: Myoneural disorder, unspecified: G70.9

## 2022-12-23 HISTORY — DX: Sleep apnea, unspecified: G47.30

## 2022-12-23 LAB — URINALYSIS, ROUTINE W REFLEX MICROSCOPIC
Bilirubin Urine: NEGATIVE
Glucose, UA: NEGATIVE mg/dL
Hgb urine dipstick: NEGATIVE
Ketones, ur: NEGATIVE mg/dL
Leukocytes,Ua: NEGATIVE
Nitrite: NEGATIVE
Protein, ur: NEGATIVE mg/dL
Specific Gravity, Urine: 1.014 (ref 1.005–1.030)
pH: 7 (ref 5.0–8.0)

## 2022-12-23 LAB — COMPREHENSIVE METABOLIC PANEL
ALT: 29 U/L (ref 0–44)
AST: 32 U/L (ref 15–41)
Albumin: 4.4 g/dL (ref 3.5–5.0)
Alkaline Phosphatase: 35 U/L — ABNORMAL LOW (ref 38–126)
Anion gap: 11 (ref 5–15)
BUN: 22 mg/dL (ref 8–23)
CO2: 25 mmol/L (ref 22–32)
Calcium: 10.5 mg/dL — ABNORMAL HIGH (ref 8.9–10.3)
Chloride: 101 mmol/L (ref 98–111)
Creatinine, Ser: 1.05 mg/dL — ABNORMAL HIGH (ref 0.44–1.00)
GFR, Estimated: 54 mL/min — ABNORMAL LOW (ref >60)
Glucose, Bld: 110 mg/dL — ABNORMAL HIGH (ref 70–99)
Potassium: 3.7 mmol/L (ref 3.5–5.1)
Sodium: 137 mmol/L (ref 135–145)
Total Bilirubin: 0.9 mg/dL (ref 0.3–1.2)
Total Protein: 7.8 g/dL (ref 6.5–8.1)

## 2022-12-23 LAB — SURGICAL PCR SCREEN
MRSA, PCR: NEGATIVE
Staphylococcus aureus: NEGATIVE

## 2022-12-23 NOTE — Patient Instructions (Addendum)
Your procedure is scheduled on: 12/31/22 - Tuesday Report to the Registration Desk on the 1st floor of the [facility]. To find out your arrival time, please call [phone] between 1PM - 3PM on: 12/30/22 - Monday If your arrival time is 6:00 am, do not arrive before that time as the [facility] entrance doors do not open until 6:00 am.  REMEMBER: Instructions that are not followed completely may result in serious medical risk, up to and including death; or upon the discretion of your surgeon and anesthesiologist your surgery may need to be rescheduled.  Do not eat food after midnight the night before surgery.  No gum chewing or hard candies.  You may however, drink CLEAR liquids up to 2 hours before you are scheduled to arrive for your surgery. Do not drink anything within 2 hours of your scheduled arrival time.  Clear liquids include: - water   In addition, your doctor has ordered for you to drink the provided:  Gatorade G2 Drinking this carbohydrate drink up to two hours before surgery helps to reduce insulin resistance and improve patient outcomes. Please complete drinking 2 hours before scheduled arrival time.  One week prior to surgery: Stop Anti-inflammatories (NSAIDS) such as Advil, Aleve, Ibuprofen, Motrin, Naproxen, Naprosyn and Aspirin based products such as Excedrin, Goody's Powder, BC Powder.  Stop ANY OVER THE COUNTER supplements until after surgery- Calcium Carbonate-Vit D-Min , Multiple Vitamins-Minerals .  You may however, continue to take Tylenol if needed for pain up until the day of surgery.  Continue taking all prescribed medications with the exception of the following:  TRULICITY - hold 7 days prior to your surgery.    TAKE ONLY THESE MEDICATIONS THE MORNING OF SURGERY WITH A SIP OF WATER:  anastrozole (ARIMIDEX)  2.   fluticasone (FLONASE)   Use PULMICORT FLEXHALER  on the day of surgery and bring to the hospital.   No Alcohol for 24 hours before  or after surgery.  No Smoking including e-cigarettes for 24 hours before surgery.  No chewable tobacco products for at least 6 hours before surgery.  No nicotine patches on the day of surgery.  Do not use any "recreational" drugs for at least a week (preferably 2 weeks) before your surgery.  Please be advised that the combination of cocaine and anesthesia may have negative outcomes, up to and including death. If you test positive for cocaine, your surgery will be cancelled.  On the morning of surgery brush your teeth with toothpaste and water, you may rinse your mouth with mouthwash if you wish. Do not swallow any toothpaste or mouthwash.  Use CHG Soap or wipes as directed on instruction sheet.  Do not wear jewelry, make-up, hairpins, clips or nail polish.  Do not wear lotions, powders, or perfumes.   Do not shave body hair from the neck down 48 hours before surgery.  Contact lenses, hearing aids and dentures may not be worn into surgery.  Do not bring valuables to the hospital. [facility] is not responsible for any missing/lost belongings or valuables.   Bring your C-PAP to the hospital in case you may have to spend the night.   Notify your doctor if there is any change in your medical condition (cold, fever, infection).  Wear comfortable clothing (specific to your surgery type) to the hospital.  After surgery, you can help prevent lung complications by doing breathing exercises.  Take deep breaths and cough every 1-2 hours. Your doctor may order a device called  an Facilities manager to help you take deep breaths. When coughing or sneezing, hold a pillow firmly against your incision with both hands. This is called "splinting." Doing this helps protect your incision. It also decreases belly discomfort.  If you are being admitted to the hospital overnight, leave your suitcase in the car. After surgery it may be brought to your room.  In case of increased patient census, it may  be necessary for you, the patient, to continue your postoperative care in the Same Day Surgery department.  If you are being discharged the day of surgery, you will not be allowed to drive home. You will need a responsible individual to drive you home and stay with you for 24 hours after surgery.   If you are taking public transportation, you will need to have a responsible individual with you.  Please call the Pre-admissions Testing Dept. at [phone] if you have any questions about these instructions.  Surgery Visitation Policy:  Patients having surgery or a procedure may have two visitors.  Children under the age of 11 must have an adult with them who is not the patient.  Inpatient Visitation:    Visiting hours are 7 a.m. to 8 p.m. Up to four visitors are allowed at one time in a patient room. The visitors may rotate out with other people during the day.  One visitor age 61 or older may stay with the patient overnight and must be in the room by 8 p.m.    Preparing for Surgery with CHLORHEXIDINE GLUCONATE (CHG) Soap  Chlorhexidine Gluconate (CHG) Soap  o An antiseptic cleaner that kills germs and bonds with the skin to continue killing germs even after washing  o Used for showering the night before surgery and morning of surgery  Before surgery, you can play an important role by reducing the number of germs on your skin.  CHG (Chlorhexidine gluconate) soap is an antiseptic cleanser which kills germs and bonds with the skin to continue killing germs even after washing.  Please do not use if you have an allergy to CHG or antibacterial soaps. If your skin becomes reddened/irritated stop using the CHG.  1. Shower the NIGHT BEFORE SURGERY and the MORNING OF SURGERY with CHG soap.  2. If you choose to wash your hair, wash your hair first as usual with your normal shampoo.  3. After shampooing, rinse your hair and body thoroughly to remove the shampoo.  4. Use CHG as you  would any other liquid soap. You can apply CHG directly to the skin and wash gently with a scrungie or a clean washcloth.  5. Apply the CHG soap to your body only from the neck down. Do not use on open wounds or open sores. Avoid contact with your eyes, ears, mouth, and genitals (private parts). Wash face and genitals (private parts) with your normal soap.  6. Wash thoroughly, paying special attention to the area where your surgery will be performed.  7. Thoroughly rinse your body with warm water.  8. Do not shower/wash with your normal soap after using and rinsing off the CHG soap.  9. Pat yourself dry with a clean towel.  10. Wear clean pajamas to bed the night before surgery.  12. Place clean sheets on your bed the night of your first shower and do not sleep with pets.  13. Shower again with the CHG soap on the day of surgery prior to arriving at the hospital.  14. Do not apply any  deodorants/lotions/powders.  15. Please wear clean clothes to the hospital. How to Use an Incentive Spirometer  An incentive spirometer is a tool that measures how well you are filling your lungs with each breath. Learning to take long, deep breaths using this tool can help you keep your lungs clear and active. This may help to reverse or lessen your chance of developing breathing (pulmonary) problems, especially infection. You may be asked to use a spirometer: After a surgery. If you have a lung problem or a history of smoking. After a long period of time when you have been unable to move or be active. If the spirometer includes an indicator to show the highest number that you have reached, your health care provider or respiratory therapist will help you set a goal. Keep a log of your progress as told by your health care provider. What are the risks? Breathing too quickly may cause dizziness or cause you to pass out. Take your time so you do not get dizzy or light-headed. If you are in pain, you may need  to take pain medicine before doing incentive spirometry. It is harder to take a deep breath if you are having pain. How to use your incentive spirometer  Sit up on the edge of your bed or on a chair. Hold the incentive spirometer so that it is in an upright position. Before you use the spirometer, breathe out normally. Place the mouthpiece in your mouth. Make sure your lips are closed tightly around it. Breathe in slowly and as deeply as you can through your mouth, causing the piston or the ball to rise toward the top of the chamber. Hold your breath for 3-5 seconds, or for as long as possible. If the spirometer includes a coach indicator, use this to guide you in breathing. Slow down your breathing if the indicator goes above the marked areas. Remove the mouthpiece from your mouth and breathe out normally. The piston or ball will return to the bottom of the chamber. Rest for a few seconds, then repeat the steps 10 or more times. Take your time and take a few normal breaths between deep breaths so that you do not get dizzy or light-headed. Do this every 1-2 hours when you are awake. If the spirometer includes a goal marker to show the highest number you have reached (best effort), use this as a goal to work toward during each repetition. After each set of 10 deep breaths, cough a few times. This will help to make sure that your lungs are clear. If you have an incision on your chest or abdomen from surgery, place a pillow or a rolled-up towel firmly against the incision when you cough. This can help to reduce pain while taking deep breaths and coughing. General tips When you are able to get out of bed: Walk around often. Continue to take deep breaths and cough in order to clear your lungs. Keep using the incentive spirometer until your health care provider says it is okay to stop using it. If you have been in the hospital, you may be told to keep using the spirometer at home. Contact a health  care provider if: You are having difficulty using the spirometer. You have trouble using the spirometer as often as instructed. Your pain medicine is not giving enough relief for you to use the spirometer as told. You have a fever. Get help right away if: You develop shortness of breath. You develop a cough with bloody mucus  from the lungs. You have fluid or blood coming from an incision site after you cough. Summary An incentive spirometer is a tool that can help you learn to take long, deep breaths to keep your lungs clear and active. You may be asked to use a spirometer after a surgery, if you have a lung problem or a history of smoking, or if you have been inactive for a long period of time. Use your incentive spirometer as instructed every 1-2 hours while you are awake. If you have an incision on your chest or abdomen, place a pillow or a rolled-up towel firmly against your incision when you cough. This will help to reduce pain. Get help right away if you have shortness of breath, you cough up bloody mucus, or blood comes from your incision when you cough. This information is not intended to replace advice given to you by your health care provider. Make sure you discuss any questions you have with your health care provider. Document Revised: 11/01/2019 Document Reviewed: 11/01/2019 Elsevier Patient Education  2023 [employer].

## 2022-12-26 DIAGNOSIS — S83241D Other tear of medial meniscus, current injury, right knee, subsequent encounter: Secondary | ICD-10-CM | POA: Diagnosis not present

## 2022-12-26 DIAGNOSIS — E119 Type 2 diabetes mellitus without complications: Secondary | ICD-10-CM | POA: Diagnosis not present

## 2022-12-26 DIAGNOSIS — M1711 Unilateral primary osteoarthritis, right knee: Secondary | ICD-10-CM | POA: Diagnosis not present

## 2022-12-30 LAB — TYPE AND SCREEN
ABO/RH(D): A POS
Antibody Screen: POSITIVE

## 2022-12-30 LAB — BPAM RBC

## 2022-12-30 MED ORDER — FAMOTIDINE 20 MG PO TABS
20.0000 mg | ORAL_TABLET | Freq: Once | ORAL | Status: AC
  Administered 2022-12-31: 20 mg via ORAL

## 2022-12-30 MED ORDER — SODIUM CHLORIDE 0.9 % IV SOLN: INTRAVENOUS | Status: DC

## 2022-12-30 MED ORDER — CHLORHEXIDINE GLUCONATE 0.12 % MT SOLN
15.0000 mL | Freq: Once | OROMUCOSAL | Status: AC
  Administered 2022-12-31: 15 mL via OROMUCOSAL

## 2022-12-30 MED ORDER — CEFAZOLIN SODIUM-DEXTROSE 2-4 GM/100ML-% IV SOLN
2.0000 g | INTRAVENOUS | Status: AC
  Administered 2022-12-31: 2 g via INTRAVENOUS

## 2022-12-30 MED ORDER — ORAL CARE MOUTH RINSE: 15.0000 mL | Freq: Once | OROMUCOSAL | Status: AC

## 2022-12-31 ENCOUNTER — Ambulatory Visit: Payer: Medicare Other | Admitting: Anesthesiology

## 2022-12-31 ENCOUNTER — Ambulatory Visit: Payer: Medicare Other | Admitting: Urgent Care

## 2022-12-31 ENCOUNTER — Ambulatory Visit: Payer: Medicare Other

## 2022-12-31 ENCOUNTER — Ambulatory Visit
Admission: RE | Admit: 2022-12-31 | Discharge: 2022-12-31 | Disposition: A | Discharge status: Home / Self Care | Payer: Medicare Other | Source: Ambulatory Visit | Attending: Surgery | Admitting: Surgery

## 2022-12-31 ENCOUNTER — Other Ambulatory Visit: Payer: Self-pay

## 2022-12-31 ENCOUNTER — Encounter
Admission: RE | Disposition: A | Discharge status: Home / Self Care | Payer: Self-pay | Source: Ambulatory Visit | Attending: Surgery

## 2022-12-31 ENCOUNTER — Encounter: Payer: Self-pay | Admitting: Surgery

## 2022-12-31 DIAGNOSIS — Z96651 Presence of right artificial knee joint: Secondary | ICD-10-CM | POA: Diagnosis not present

## 2022-12-31 DIAGNOSIS — G4730 Sleep apnea, unspecified: Secondary | ICD-10-CM | POA: Insufficient documentation

## 2022-12-31 DIAGNOSIS — E7800 Pure hypercholesterolemia, unspecified: Secondary | ICD-10-CM | POA: Insufficient documentation

## 2022-12-31 DIAGNOSIS — M8580 Other specified disorders of bone density and structure, unspecified site: Secondary | ICD-10-CM | POA: Insufficient documentation

## 2022-12-31 DIAGNOSIS — S83241A Other tear of medial meniscus, current injury, right knee, initial encounter: Secondary | ICD-10-CM | POA: Insufficient documentation

## 2022-12-31 DIAGNOSIS — Z7951 Long term (current) use of inhaled steroids: Secondary | ICD-10-CM | POA: Insufficient documentation

## 2022-12-31 DIAGNOSIS — E785 Hyperlipidemia, unspecified: Secondary | ICD-10-CM | POA: Insufficient documentation

## 2022-12-31 DIAGNOSIS — Z79899 Other long term (current) drug therapy: Secondary | ICD-10-CM | POA: Insufficient documentation

## 2022-12-31 DIAGNOSIS — Z791 Long term (current) use of non-steroidal anti-inflammatories (NSAID): Secondary | ICD-10-CM | POA: Insufficient documentation

## 2022-12-31 DIAGNOSIS — Z471 Aftercare following joint replacement surgery: Secondary | ICD-10-CM | POA: Diagnosis not present

## 2022-12-31 DIAGNOSIS — Z7985 Long-term (current) use of injectable non-insulin antidiabetic drugs: Secondary | ICD-10-CM | POA: Insufficient documentation

## 2022-12-31 DIAGNOSIS — X58XXXA Exposure to other specified factors, initial encounter: Secondary | ICD-10-CM | POA: Insufficient documentation

## 2022-12-31 DIAGNOSIS — I10 Essential (primary) hypertension: Secondary | ICD-10-CM | POA: Insufficient documentation

## 2022-12-31 DIAGNOSIS — K219 Gastro-esophageal reflux disease without esophagitis: Secondary | ICD-10-CM | POA: Diagnosis not present

## 2022-12-31 DIAGNOSIS — Z9221 Personal history of antineoplastic chemotherapy: Secondary | ICD-10-CM | POA: Diagnosis not present

## 2022-12-31 DIAGNOSIS — Z79811 Long term (current) use of aromatase inhibitors: Secondary | ICD-10-CM | POA: Insufficient documentation

## 2022-12-31 DIAGNOSIS — E119 Type 2 diabetes mellitus without complications: Secondary | ICD-10-CM | POA: Insufficient documentation

## 2022-12-31 DIAGNOSIS — M1711 Unilateral primary osteoarthritis, right knee: Secondary | ICD-10-CM | POA: Insufficient documentation

## 2022-12-31 DIAGNOSIS — I2510 Atherosclerotic heart disease of native coronary artery without angina pectoris: Secondary | ICD-10-CM | POA: Diagnosis not present

## 2022-12-31 DIAGNOSIS — Z01812 Encounter for preprocedural laboratory examination: Secondary | ICD-10-CM

## 2022-12-31 HISTORY — PX: PARTIAL KNEE ARTHROPLASTY: SHX2174

## 2022-12-31 LAB — BPAM RBC
Blood Product Expiration Date: 202405242359
Unit Type and Rh: 600
Unit Type and Rh: 600

## 2022-12-31 LAB — GLUCOSE, CAPILLARY
Glucose-Capillary: 207 mg/dL — ABNORMAL HIGH (ref 70–99)
Glucose-Capillary: 161 mg/dL — ABNORMAL HIGH (ref 70–99)

## 2022-12-31 LAB — TYPE AND SCREEN
Unit division: 0
Unit division: 0

## 2022-12-31 SURGERY — ARTHROPLASTY, KNEE, UNICOMPARTMENTAL
Anesthesia: Spinal | Site: Knee | Laterality: Right

## 2022-12-31 MED ORDER — CHLORHEXIDINE GLUCONATE 0.12 % MT SOLN
OROMUCOSAL | Status: AC
  Filled 2022-12-31: qty 15

## 2022-12-31 MED ORDER — FENTANYL CITRATE (PF) 100 MCG/2ML IJ SOLN
INTRAMUSCULAR | Status: AC
  Filled 2022-12-31: qty 2

## 2022-12-31 MED ORDER — TRANEXAMIC ACID 1000 MG/10ML IV SOLN
INTRAVENOUS | Status: DC | PRN
  Administered 2022-12-31: 1000 mg via TOPICAL

## 2022-12-31 MED ORDER — TRANEXAMIC ACID 1000 MG/10ML IV SOLN
INTRAVENOUS | Status: AC
  Filled 2022-12-31: qty 10

## 2022-12-31 MED ORDER — BUPIVACAINE LIPOSOME 1.3 % IJ SUSP
INTRAMUSCULAR | Status: AC
  Filled 2022-12-31: qty 20

## 2022-12-31 MED ORDER — DEXMEDETOMIDINE HCL IN NACL 200 MCG/50ML IV SOLN
INTRAVENOUS | Status: DC | PRN
  Administered 2022-12-31: 8 ug via INTRAVENOUS

## 2022-12-31 MED ORDER — OXYCODONE HCL 5 MG PO TABS
5.0000 mg | ORAL_TABLET | ORAL | Status: DC | PRN
  Administered 2022-12-31: 5 mg via ORAL

## 2022-12-31 MED ORDER — CEFAZOLIN SODIUM-DEXTROSE 2-4 GM/100ML-% IV SOLN
INTRAVENOUS | Status: AC
  Filled 2022-12-31: qty 100

## 2022-12-31 MED ORDER — ONDANSETRON HCL 4 MG/2ML IJ SOLN: 4.0000 mg | Freq: Four times a day (QID) | INTRAMUSCULAR | Status: DC | PRN

## 2022-12-31 MED ORDER — VALACYCLOVIR HCL 500 MG PO TABS: 500.0000 mg | ORAL_TABLET | Freq: Two times a day (BID) | ORAL | Status: DC

## 2022-12-31 MED ORDER — APIXABAN 2.5 MG PO TABS: 2.5000 mg | ORAL_TABLET | Freq: Two times a day (BID) | ORAL | 0 refills | Status: DC

## 2022-12-31 MED ORDER — BUPIVACAINE HCL (PF) 0.5 % IJ SOLN
INTRAMUSCULAR | Status: DC | PRN
  Administered 2022-12-31: 2.2 mL

## 2022-12-31 MED ORDER — PROPOFOL 500 MG/50ML IV EMUL
INTRAVENOUS | Status: DC | PRN
  Administered 2022-12-31: 50 ug/kg/min via INTRAVENOUS

## 2022-12-31 MED ORDER — MIDAZOLAM HCL 2 MG/2ML IJ SOLN
INTRAMUSCULAR | Status: AC
  Filled 2022-12-31: qty 2

## 2022-12-31 MED ORDER — METOCLOPRAMIDE HCL 5 MG/ML IJ SOLN: 5.0000 mg | Freq: Three times a day (TID) | INTRAMUSCULAR | Status: DC | PRN

## 2022-12-31 MED ORDER — FENTANYL CITRATE (PF) 100 MCG/2ML IJ SOLN
INTRAMUSCULAR | Status: DC | PRN
  Administered 2022-12-31 (×2): 50 ug via INTRAVENOUS

## 2022-12-31 MED ORDER — OXYCODONE HCL 5 MG PO TABS
ORAL_TABLET | ORAL | Status: AC
  Filled 2022-12-31: qty 1

## 2022-12-31 MED ORDER — KETOROLAC TROMETHAMINE 15 MG/ML IJ SOLN
INTRAMUSCULAR | Status: AC
  Filled 2022-12-31: qty 1

## 2022-12-31 MED ORDER — EPHEDRINE SULFATE (PRESSORS) 50 MG/ML IJ SOLN
INTRAMUSCULAR | Status: DC | PRN
  Administered 2022-12-31: 20 mg via INTRAVENOUS

## 2022-12-31 MED ORDER — CEFAZOLIN SODIUM-DEXTROSE 2-4 GM/100ML-% IV SOLN
2.0000 g | Freq: Four times a day (QID) | INTRAVENOUS | Status: DC
  Administered 2022-12-31: 2 g via INTRAVENOUS

## 2022-12-31 MED ORDER — SODIUM CHLORIDE FLUSH 0.9 % IV SOLN
INTRAVENOUS | Status: AC
  Filled 2022-12-31: qty 10

## 2022-12-31 MED ORDER — 0.9 % SODIUM CHLORIDE (POUR BTL) OPTIME
TOPICAL | Status: DC | PRN
  Administered 2022-12-31: 500 mL

## 2022-12-31 MED ORDER — OXYCODONE HCL 5 MG PO TABS: 5.0000 mg | ORAL_TABLET | ORAL | 0 refills | Status: DC | PRN

## 2022-12-31 MED ORDER — FAMOTIDINE 20 MG PO TABS
ORAL_TABLET | ORAL | Status: AC
  Filled 2022-12-31: qty 1

## 2022-12-31 MED ORDER — ACETAMINOPHEN 10 MG/ML IV SOLN: 1000.0000 mg | Freq: Once | INTRAVENOUS | Status: DC | PRN

## 2022-12-31 MED ORDER — ONDANSETRON HCL 4 MG PO TABS: 4.0000 mg | ORAL_TABLET | Freq: Four times a day (QID) | ORAL | Status: DC | PRN

## 2022-12-31 MED ORDER — TRIAMCINOLONE ACETONIDE 40 MG/ML IJ SUSP
INTRAMUSCULAR | Status: DC | PRN
  Administered 2022-12-31: 62.5 mL via PERIARTICULAR

## 2022-12-31 MED ORDER — FENTANYL CITRATE (PF) 100 MCG/2ML IJ SOLN: 25.0000 ug | INTRAMUSCULAR | Status: DC | PRN

## 2022-12-31 MED ORDER — KETOROLAC TROMETHAMINE 30 MG/ML IJ SOLN
INTRAMUSCULAR | Status: AC
  Filled 2022-12-31: qty 1

## 2022-12-31 MED ORDER — SODIUM CHLORIDE 0.9 % IV SOLN: INTRAVENOUS | Status: DC

## 2022-12-31 MED ORDER — ACETAMINOPHEN 325 MG PO TABS: 325.0000 mg | ORAL_TABLET | Freq: Four times a day (QID) | ORAL | Status: DC | PRN

## 2022-12-31 MED ORDER — EPHEDRINE 5 MG/ML INJ
INTRAVENOUS | Status: AC
  Filled 2022-12-31: qty 5

## 2022-12-31 MED ORDER — PHENYLEPHRINE HCL (PRESSORS) 10 MG/ML IV SOLN
INTRAVENOUS | Status: DC | PRN
  Administered 2022-12-31 (×3): 80 ug via INTRAVENOUS

## 2022-12-31 MED ORDER — PROPOFOL 1000 MG/100ML IV EMUL
INTRAVENOUS | Status: AC
  Filled 2022-12-31: qty 100

## 2022-12-31 MED ORDER — ONDANSETRON HCL 4 MG/2ML IJ SOLN: 4.0000 mg | Freq: Once | INTRAMUSCULAR | Status: DC | PRN

## 2022-12-31 MED ORDER — SODIUM CHLORIDE 0.9 % IR SOLN
Status: DC | PRN
  Administered 2022-12-31: 3000 mL

## 2022-12-31 MED ORDER — BUPIVACAINE HCL (PF) 0.5 % IJ SOLN
INTRAMUSCULAR | Status: AC
  Filled 2022-12-31: qty 30

## 2022-12-31 MED ORDER — OXYCODONE HCL 5 MG/5ML PO SOLN: 5.0000 mg | Freq: Once | ORAL | Status: DC | PRN

## 2022-12-31 MED ORDER — KETOROLAC TROMETHAMINE 15 MG/ML IJ SOLN
15.0000 mg | Freq: Once | INTRAMUSCULAR | Status: AC
  Administered 2022-12-31: 15 mg via INTRAVENOUS

## 2022-12-31 MED ORDER — TRIAMCINOLONE ACETONIDE 40 MG/ML IJ SUSP
INTRAMUSCULAR | Status: AC
  Filled 2022-12-31: qty 2

## 2022-12-31 MED ORDER — EPINEPHRINE PF 1 MG/ML IJ SOLN
INTRAMUSCULAR | Status: AC
  Filled 2022-12-31: qty 1

## 2022-12-31 MED ORDER — OXYCODONE HCL 5 MG PO TABS: 5.0000 mg | ORAL_TABLET | Freq: Once | ORAL | Status: DC | PRN

## 2022-12-31 MED ORDER — SODIUM CHLORIDE 0.9 % BOLUS PEDS
250.0000 mL | Freq: Once | INTRAVENOUS | Status: AC
  Administered 2022-12-31: 250 mL via INTRAVENOUS

## 2022-12-31 MED ORDER — METOCLOPRAMIDE HCL 10 MG PO TABS: 5.0000 mg | ORAL_TABLET | Freq: Three times a day (TID) | ORAL | Status: DC | PRN

## 2022-12-31 SURGICAL SUPPLY — 65 items
APL PRP STRL LF DISP 70% ISPRP (MISCELLANEOUS) ×2
BEARING TIB MENISCAL RT XS 4MM (Knees) IMPLANT
BIT DRILL QUICK REL 1/8 2PK SL (BIT) IMPLANT
BNDG CMPR 5X62 HK CLSR LF (GAUZE/BANDAGES/DRESSINGS) ×1
BNDG CMPR 6"X 5 YARDS HK CLSR (GAUZE/BANDAGES/DRESSINGS) ×1
BNDG ELASTIC 6INX 5YD STR LF (GAUZE/BANDAGES/DRESSINGS) ×1 IMPLANT
BRNG TIB XS 4XBYNT PNT SMTH (Knees) ×1 IMPLANT
CEMENT BONE R 1X40 (Cement) ×1 IMPLANT
CEMENT VACUUM MIXING SYSTEM (MISCELLANEOUS) ×1 IMPLANT
CHLORAPREP W/TINT 26 (MISCELLANEOUS) ×2 IMPLANT
COOLER POLAR GLACIER W/PUMP (MISCELLANEOUS) ×1 IMPLANT
COVER MAYO STAND REUSABLE (DRAPES) ×1 IMPLANT
CUFF TOURN SGL QUICK 24 (TOURNIQUET CUFF)
CUFF TOURN SGL QUICK 34 (TOURNIQUET CUFF)
CUFF TRNQT CYL 24X4X16.5-23 (TOURNIQUET CUFF) IMPLANT
CUFF TRNQT CYL 34X4.125X (TOURNIQUET CUFF) IMPLANT
DRAPE C-ARM XRAY 36X54 (DRAPES) IMPLANT
DRAPE U-SHAPE 47X51 STRL (DRAPES) ×2 IMPLANT
DRSG MEPILEX SACRM 8.7X9.8 (GAUZE/BANDAGES/DRESSINGS) ×1 IMPLANT
DRSG OPSITE POSTOP 4X12 (GAUZE/BANDAGES/DRESSINGS) ×1 IMPLANT
DRSG OPSITE POSTOP 4X6 (GAUZE/BANDAGES/DRESSINGS) ×1 IMPLANT
ELECT CAUTERY BLADE 6.4 (BLADE) ×1 IMPLANT
ELECT REM PT RETURN 9FT ADLT (ELECTROSURGICAL) ×1
ELECTRODE REM PT RTRN 9FT ADLT (ELECTROSURGICAL) ×1 IMPLANT
GAUZE SPONGE 4X4 12PLY STRL (GAUZE/BANDAGES/DRESSINGS) ×1 IMPLANT
GAUZE XEROFORM 1X8 LF (GAUZE/BANDAGES/DRESSINGS) ×1 IMPLANT
GLOVE BIO SURGEON STRL SZ7.5 (GLOVE) ×4 IMPLANT
GLOVE BIO SURGEON STRL SZ8 (GLOVE) ×4 IMPLANT
GLOVE BIOGEL PI IND STRL 8 (GLOVE) ×1 IMPLANT
GOWN STRL REUS W/ TWL LRG LVL3 (GOWN DISPOSABLE) ×1 IMPLANT
GOWN STRL REUS W/ TWL XL LVL3 (GOWN DISPOSABLE) ×1 IMPLANT
GOWN STRL REUS W/TWL LRG LVL3 (GOWN DISPOSABLE) ×1
GOWN STRL REUS W/TWL XL LVL3 (GOWN DISPOSABLE) ×1
HOOD PEEL AWAY T7 (MISCELLANEOUS) ×3 IMPLANT
IV NS IRRIG 3000ML ARTHROMATIC (IV SOLUTION) ×1 IMPLANT
KIT TURNOVER KIT A (KITS) ×1 IMPLANT
KNEE PARTIAL CEMENT FEM XS (Miscellaneous) IMPLANT
MANIFOLD NEPTUNE II (INSTRUMENTS) ×1 IMPLANT
MAT ABSORB  FLUID 56X50 GRAY (MISCELLANEOUS) ×1
MAT ABSORB FLUID 56X50 GRAY (MISCELLANEOUS) ×1 IMPLANT
NDL SAFETY ECLIP 18X1.5 (MISCELLANEOUS) ×1 IMPLANT
NDL SPNL 20GX3.5 QUINCKE YW (NEEDLE) ×1 IMPLANT
NEEDLE SPNL 20GX3.5 QUINCKE YW (NEEDLE) ×1 IMPLANT
NS IRRIG 1000ML POUR BTL (IV SOLUTION) ×1 IMPLANT
PACK BLADE SAW RECIP 70 3 PT (BLADE) IMPLANT
PACK TOTAL KNEE (MISCELLANEOUS) ×1 IMPLANT
PAD ABD DERMACEA PRESS 5X9 (GAUZE/BANDAGES/DRESSINGS) ×2 IMPLANT
PAD WRAPON POLAR KNEE (MISCELLANEOUS) ×1 IMPLANT
PULSAVAC PLUS IRRIG FAN TIP (DISPOSABLE) ×1
STAPLER SKIN PROX 35W (STAPLE) ×1 IMPLANT
STRAP SAFETY 5IN WIDE (MISCELLANEOUS) ×1 IMPLANT
SUCTION FRAZIER HANDLE 10FR (MISCELLANEOUS) ×1
SUCTION TUBE FRAZIER 10FR DISP (MISCELLANEOUS) ×1 IMPLANT
SUT VIC AB 0 CT1 36 (SUTURE) ×1 IMPLANT
SUT VIC AB 2-0 CT1 27 (SUTURE) ×4
SUT VIC AB 2-0 CT1 TAPERPNT 27 (SUTURE) ×4 IMPLANT
SYR 10ML LL (SYRINGE) ×1 IMPLANT
SYR 30ML LL (SYRINGE) ×2 IMPLANT
TAPE TRANSPORE STRL 2 31045 (GAUZE/BANDAGES/DRESSINGS) ×1 IMPLANT
TIP FAN IRRIG PULSAVAC PLUS (DISPOSABLE) ×1 IMPLANT
TRAP FLUID SMOKE EVACUATOR (MISCELLANEOUS) ×1 IMPLANT
TRAY TIBIA MEDIAL OXFORD SZ A (Joint) IMPLANT
UNICOMP OXFORD MENISCAL XS 4MM (Knees) ×1 IMPLANT
WATER STERILE IRR 500ML POUR (IV SOLUTION) ×1 IMPLANT
WRAPON POLAR PAD KNEE (MISCELLANEOUS) ×1

## 2022-12-31 NOTE — Discharge Instructions (Addendum)
Orthopedic discharge instructions: May shower with intact OpSite dressing. Apply ice frequently to knee or use Polar Care. Start Eliquis 1 tablet (2.5 mg) twice daily on Wednesday, 01/01/2023, for 2 weeks, then take 4 baby aspirin (81 mg x 4) twice daily for 4 weeks. Take pain medication as prescribed when needed.  May supplement with ES Tylenol if necessary. May weight-bear as tolerated on right leg - use walker for balance and support. Follow-up in 10-14 days or as scheduled.       AMBULATORY SURGERY  DISCHARGE INSTRUCTIONS   The drugs that you were given will stay in your system until tomorrow so for the next 24 hours you should not:  Drive an automobile Make any legal decisions Drink any alcoholic beverage   You may resume regular meals tomorrow.  Today it is better to start with liquids and gradually work up to solid foods.  You may eat anything you prefer, but it is better to start with liquids, then soup and crackers, and gradually work up to solid foods.   Please notify your doctor immediately if you have any unusual bleeding, trouble breathing, redness and pain at the surgery site, drainage, fever, or pain not relieved by medication.   Additional Instructions:  LEAVE GREEN ARMBAND ON FOR 4 DAYS   POLAR CARE INFORMATION  MassAdvertisement.it  How to use [facility] Polar Care [facility] Therapy System?  YouTube   ShippingScam.co.uk  OPERATING INSTRUCTIONS  Start the product With dry hands, connect the transformer to the electrical connection located on the top of the cooler. Next, plug the transformer into an appropriate electrical outlet. The unit will automatically start running at this point.  To stop the pump, disconnect electrical power.  Unplug to stop the product when not in use. Unplugging the Polar Care unit turns it off. Always unplug immediately after use. Never leave it plugged in while unattended. Remove pad.    FIRST ADD WATER TO FILL  LINE, THEN ICE---Replace ice when existing ice is almost melted  1 Discuss Treatment with your Licensed Health Care Practitioner and Use Only as Prescribed 2 Apply Insulation Barrier & Cold Therapy Pad 3 Check for Moisture 4 Inspect Skin Regularly  Tips and Trouble Shooting Usage Tips 1. Use cubed or chunked ice for optimal performance. 2. It is recommended to drain the Pad between uses. To drain the pad, hold the Pad upright with the hose pointed toward the ground. Depress the black plunger and allow water to drain out. 3. You may disconnect the Pad from the unit without removing the pad from the affected area by depressing the silver tabs on the hose coupling and gently pulling the hoses apart. The Pad and unit will seal itself and will not leak. Note: Some dripping during release is normal. 4. DO NOT RUN PUMP WITHOUT WATER! The pump in this unit is designed to run with water. Running the unit without water will cause permanent damage to the pump. 5. Unplug unit before removing lid.  TROUBLESHOOTING GUIDE Pump not running, Water not flowing to the pad, Pad is not getting cold 1. Make sure the transformer is plugged into the wall outlet. 2. Confirm that the ice and water are filled to the indicated levels. 3. Make sure there are no kinks in the pad. 4. Gently pull on the blue tube to make sure the tube/pad junction is straight. 5. Remove the pad from the treatment site and ll it while the pad is lying at; then reapply. 6. Confirm  that the pad couplings are securely attached to the unit. Listen for the double clicks (Figure 1) to confirm the pad couplings are securely attached.  Leaks    Note: Some condensation on the lines, controller, and pads is unavoidable, especially in warmer climates. 1. If using a [employer] Polar Care Cold Therapy unit with a detachable Cold Therapy Pad, and a leak exists (other than condensation on the lines) disconnect the pad couplings. Make sure the silver tabs on the  couplings are depressed before reconnecting the pad to the pump hose; then confirm both sides of the coupling are properly clicked in. 2. If the coupling continues to leak or a leak is detected in the pad itself, stop using it and call [employer] Customer Care at [phone].  Cleaning After use, empty and dry the unit with a soft cloth. Warm water and mild detergent may be used occasionally to clean the pump and tubes.  WARNING: The Polar Care Cube can be cold enough to cause serious injury, including full skin necrosis. Follow these Operating Instructions, and carefully read the Product Insert (see pouch on side of unit) and the Cold Therapy Pad Fitting Instructions (provided with each Cold Therapy Pad) prior to use.

## 2022-12-31 NOTE — Evaluation (Signed)
Physical Therapy Evaluation Patient Details Name: Katie [name] MRN: [id number] DOB: Jan 04, 1946 Today's Date: 12/31/2022  History of Present Illness  Pt is a 77 y.o. female s/p R unicondylar knee arthroplasty 12/31/2022 (secondary to OA of medial compartment with complex medial meniscus tear R knee).  PMH includes anemia, DM, htn, h/o CA, osteopenia, plantar fasciitis, sleep apnea, R joint replacement 04/20/2013, L joint replacement 03/06/2018, B mastectomy for CA.  Clinical Impression  Prior to surgery, pt was independent with ambulation; lives with her husband; plans to stay on main level of home; 3-4 STE R railing.  Currently pt is modified independent semi-supine to sitting edge of bed; CGA with transfers using RW; CGA to SBA ambulating up to 100 feet with RW use; and CGA to navigate 4 steps with R railing.  5-6/10 R knee pain during session.  Pt tolerated LE ex's fairly well with assist as needed (pt issued written HEP).  Educated pt on home safety and fall prevention: pt verbalizing appropriate understanding  Pt would currently benefit from skilled PT to address noted impairments and functional limitations (see below for any additional details).  Upon hospital discharge, pt would benefit from ongoing therapy (pt planning for HHPT).  Pt appears safe to discharge home from PT standpoint (pt's nurse and MD Poggi notified).    Recommendations for follow up therapy are one component of a multi-disciplinary discharge planning process, led by the attending physician.  Recommendations may be updated based on patient status, additional functional criteria and insurance authorization.        Assistance Recommended at Discharge Intermittent Supervision/Assistance  Patient can return home with the following  A little help with walking and/or transfers;A little help with bathing/dressing/bathroom;Assistance with cooking/housework;Assist for transportation;Help with stairs or ramp for entrance    Equipment  Recommendations Rolling walker (2 wheels);BSC/3in1 (pt has RW and BSC already)  Recommendations for Other Services       Functional Status Assessment Patient has had a recent decline in their functional status and demonstrates the ability to make significant improvements in function in a reasonable and predictable amount of time.     Precautions / Restrictions Precautions Precautions: Knee;Fall Precaution Booklet Issued: Yes (comment) Restrictions Weight Bearing Restrictions: Yes RLE Weight Bearing: Weight bearing as tolerated Other Position/Activity Restrictions: WBAT R leg using walker for balance and support      Mobility  Bed Mobility Overal bed mobility: Modified Independent             General bed mobility comments: Semi-supine to sitting edge of bed without any noted difficulties.    Transfers Overall transfer level: Needs assistance Equipment used: Rolling walker (2 wheels) Transfers: Sit to/from Stand Sit to Stand: Min guard           General transfer comment: x3 trials standing from bed; initial vc's for UE placement    Ambulation/Gait Ambulation/Gait assistance: Min guard, Supervision Gait Distance (Feet):  (100 feet; 80 feet) Assistive device: Rolling walker (2 wheels)   Gait velocity: decreased     General Gait Details: antalgic; step to gait pattern; initial vc's for sequencing and walker use  Stairs Stairs: Yes Stairs assistance: Min guard Stair Management: One rail Right, Step to pattern, Sideways Number of Stairs: 4 General stair comments: initial vc's for technique/LE sequencing; steady  Wheelchair Mobility    Modified Rankin (Stroke Patients Only)       Balance Overall balance assessment: Needs assistance Sitting-balance support: No upper extremity supported, Feet supported Sitting balance-Leahy Scale: Normal Sitting  balance - Comments: steady sitting reaching outside BOS   Standing balance support: No upper extremity  supported Standing balance-Leahy Scale: Good Standing balance comment: steady standing managing underwear for toileting                             Pertinent Vitals/Pain Pain Assessment Pain Assessment: 0-10 Pain Score: 6  Pain Location: R knee Pain Descriptors / Indicators: Aching, Sore, Tender Pain Intervention(s): Limited activity within patient's tolerance, Monitored during session, Repositioned, Other (comment) (RN notified) Vitals (HR and O2 on room air) stable and WFL throughout treatment session.    Home Living Family/patient expects to be discharged to:: Private residence Living Arrangements: Spouse/significant other Available Help at Discharge: Family;Available 24 hours/day Type of Home: House Home Access: Stairs to enter Entrance Stairs-Rails: Right Entrance Stairs-Number of Steps: 3-4   Home Layout: Two level;Able to live on main level with bedroom/bathroom Home Equipment: Grab bars - tub/shower;Shower seat - built in;Cane - single Librarian, academic (2 wheels);BSC/3in1 Additional Comments: Staying on main level.    Prior Function Prior Level of Function : Independent/Modified Independent             Mobility Comments: Independent with ambulation.       Hand Dominance        Extremity/Trunk Assessment   Upper Extremity Assessment Upper Extremity Assessment: Overall WFL for tasks assessed    Lower Extremity Assessment Lower Extremity Assessment: RLE deficits/detail (L LE WFL) RLE Deficits / Details: Independent R LE SLR; at least 3/5 AROM hip flexion and ankle DF/PF RLE: Unable to fully assess due to pain    Cervical / Trunk Assessment Cervical / Trunk Assessment: Normal  Communication   Communication: No difficulties  Cognition Arousal/Alertness: Awake/alert Behavior During Therapy: WFL for tasks assessed/performed Overall Cognitive Status: Within Functional Limits for tasks assessed                                           General Comments General comments (skin integrity, edema, etc.): R knee ace wrap in place.  Nursing cleared pt for participation in physical therapy.  Pt agreeable to PT session.  Pt's husband present during session.    Exercises Total Joint Exercises Ankle Circles/Pumps: AROM, Strengthening, Both, 10 reps, Supine Quad Sets: AROM, Strengthening, Right, 10 reps, Supine Short Arc Quad: AROM, Strengthening, Right, 10 reps, Supine Heel Slides: AAROM, Strengthening, Right, 10 reps, Supine Hip ABduction/ADduction: AAROM, Strengthening, Right, 10 reps, Supine Straight Leg Raises: AROM, Strengthening, Right, 10 reps, Supine Long Arc Quad: AROM, Strengthening, Right, 10 reps, Seated Knee Flexion: AROM, Strengthening, Right, 10 reps, Seated Goniometric ROM: R knee AROM 0-95 degrees   Assessment/Plan    PT Assessment Patient needs continued PT services  PT Problem List Decreased strength;Decreased range of motion;Decreased activity tolerance;Decreased balance;Decreased mobility;Decreased knowledge of use of DME;Decreased knowledge of precautions;Decreased skin integrity;Pain       PT Treatment Interventions DME instruction;Gait training;Stair training;Functional mobility training;Therapeutic activities;Therapeutic exercise;Balance training;Patient/family education    PT Goals (Current goals can be found in the Care Plan section)  Acute Rehab PT Goals Patient Stated Goal: to improve mobility PT Goal Formulation: With patient Time For Goal Achievement: 01/14/23 Potential to Achieve Goals: Good    Frequency BID     Co-evaluation  AM-PAC PT "6 Clicks" Mobility  Outcome Measure Help needed turning from your back to your side while in a flat bed without using bedrails?: None Help needed moving from lying on your back to sitting on the side of a flat bed without using bedrails?: A Little Help needed moving to and from a bed to a chair (including a  wheelchair)?: A Little Help needed standing up from a chair using your arms (e.g., wheelchair or bedside chair)?: A Little Help needed to walk in hospital room?: A Little Help needed climbing 3-5 steps with a railing? : A Little 6 Click Score: 19    End of Session Equipment Utilized During Treatment: Gait belt Activity Tolerance: Patient tolerated treatment well Patient left:  (sitting on toilet with nursing present) Nurse Communication: Mobility status;Precautions;Weight bearing status;Other (comment) (pt's pain status) PT Visit Diagnosis: Other abnormalities of gait and mobility (R26.89);Muscle weakness (generalized) (M62.81);Pain Pain - Right/Left: Right Pain - part of body: Knee    Time: 1420-1510 PT Time Calculation (min) (ACUTE ONLY): 50 min   Charges:   PT Evaluation $PT Eval Low Complexity: 1 Low PT Treatments $Gait Training: 8-22 mins $Therapeutic Exercise: 8-22 mins       Hendricks Limes, PT 12/31/22, 3:59 PM

## 2022-12-31 NOTE — Anesthesia Preprocedure Evaluation (Signed)
Anesthesia Evaluation  Patient identified by MRN, date of birth, ID band Patient awake    Reviewed: Allergy & Precautions, NPO status , Patient's Chart, lab work & pertinent test results  History of Anesthesia Complications Negative for: history of anesthetic complications  Airway Mallampati: III  TM Distance: <3 FB Neck ROM: Full    Dental no notable dental hx. (+) Teeth Intact   Pulmonary sleep apnea and Continuous Positive Airway Pressure Ventilation , neg COPD, Patient abstained from smoking.Not current smoker Lung damage s/p chemotherapy. Takes daily pulmicort, took it today. Breathing at baseline today   Pulmonary exam normal breath sounds clear to auscultation       Cardiovascular Exercise Tolerance: Good METShypertension, + CAD  (-) Past MI (-) dysrhythmias  Rhythm:Regular Rate:Normal - Systolic murmurs TTE relatively unremarkable, Grade I diastolic dysfunction   Neuro/Psych  Neuromuscular disease  negative psych ROS   GI/Hepatic ,GERD  Controlled,,(+)     (-) substance abuse    Endo/Other  diabetes  GLP1 held over a week  Renal/GU negative Renal ROS     Musculoskeletal  (+) Arthritis , Osteoarthritis,    Abdominal   Peds  Hematology Denies anticoag use   Anesthesia Other Findings Past Medical History: No date: Allergy No date: Anemia No date: 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, small, not treating-watching , it is now               stented No date: Blood transfusion without reported diagnosis No date: Cancer [facility])     Comment:  breast 08/2018 No date: Coronary artery disease No date: Diverticulitis No date: DJD (degenerative joint disease) No date: DM type 2 (diabetes mellitus, type 2) (HCC) No date: Family history of breast cancer No date: Fibrocystic breast disease No date: GERD (gastroesophageal reflux disease) No date: History of chicken pox No date: Hypercholesteremia No date:  Hypertension 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 No date: Neuromuscular disorder (HCC) No date: Sleep apnea     Comment:  cpap No date: Urinary incontinence 12/2019: Vertigo     Comment:  1 episode  Reproductive/Obstetrics                             Anesthesia Physical Anesthesia Plan  ASA: 3  Anesthesia Plan: Spinal   Post-op Pain Management: Ofirmev IV (intra-op)*   Induction: Intravenous  PONV Risk Score and Plan: 2 and Ondansetron, Dexamethasone, Propofol infusion and TIVA  Airway Management Planned: Natural Airway  Additional Equipment: None  Intra-op Plan:   Post-operative Plan:   Informed Consent: I have reviewed the patients History and Physical, chart, labs and discussed the procedure including the risks, benefits and alternatives for the proposed anesthesia with the patient or authorized representative who has indicated his/her understanding and acceptance.       Plan Discussed with: CRNA and Surgeon  Anesthesia Plan Comments: (Discussed R/B/A of neuraxial anesthesia technique with patient: - rare risks of spinal/epidural hematoma, nerve damage, infection - Risk of PDPH - Risk of nausea and vomiting - Risk of conversion to general anesthesia and its associated risks, including sore throat, damage to lips/eyes/teeth/oropharynx, and rare risks such as cardiac and respiratory events. - Risk of allergic reactions - post operative cognitive dysfunction  Patient wishes to avoid GA if possible due to concerns for post op cognitive dysfunction. I advised her that there have not been shown to be clear differences in POCD in regards to anesthesia type,  and that GETA is always a backup. She understands.  Discussed the role of CRNA in patient's perioperative care.  Patient voiced understanding.)       Anesthesia Quick Evaluation

## 2022-12-31 NOTE — H&P (Signed)
History of Present Illness: The patient is a 77 year old female who presented for a history and physical for an upcoming right medial partial knee replacement to be done by Dr. Joice Lofts on Dec 31, 2022. The patient had an MRI on November 02, 2022 revealing a medial meniscus tear with medial full-thickness cartilage loss. The patient has continued to have off-and-on knee pain with instability. She has failed conservative treatment. She has seen Micah Noel in the past and Dr. Audelia Acton. The patient is a diabetic.  Medications: alendronate (FOSAMAX) 70 MG tablet Take by mouth Take 1 tablet (70 mg total) by mouth once a week. Take with a full glass of water on an empty stomach.  anastrozole (ARIMIDEX) 1 mg tablet Take 1 mg by mouth once daily  aspirin 81 MG EC tablet Take 81 mg by mouth once daily  atorvastatin (LIPITOR) 20 MG tablet Take 20 mg by mouth once daily  budesonide (PULMICORT FLEXHALER) 180 mcg/actuation inhaler Inhale into the lungs Inhale 1 puff into the lungs 2 (two) times daily.  calcium carbonate-vitamin D3 600 mg (1,500 mg)-800 unit Chew Take 1 capsule by mouth 2 (two) times daily  clobetasol (CORMAX) 0.05 % external solution Apply 1 Application topically once daily 0  dulaglutide (TRULICITY) 1.5 mg/0.5 mL subcutaneous pen injector Inject subcutaneously Inject 1.5 mg into the skin once a week  fenofibrate nanocrystallized (TRICOR) 145 MG tablet Take by mouth Take 1 tablet (145 mg total) by mouth daily.  fluticasone propionate (FLONASE) 50 mcg/actuation nasal spray Place 2 sprays into both nostrils once daily  FREESTYLE LITE STRIPS test strip 1  gabapentin (NEURONTIN) 300 MG capsule Take 300 mg by mouth nightly  loratadine (CLARITIN) 10 mg tablet Take 10 mg by mouth once daily.  losartan-hydrochlorothiazide (HYZAAR) 100-25 mg tablet Take 1 tablet by mouth once daily  multivitamin capsule Take 1 capsule by mouth once daily  celecoxib (CELEBREX) 100 MG capsule Take 2 capsules (200 mg total) by mouth 2  (two) times daily (Patient not taking: Reported on 12/26/2022) 60 capsule 0  metaxalone (SKELAXIN) 800 mg tablet May take 1/2-1 whole tablet up to 3 times daily as needed for pain or spasm. (Patient not taking: Reported on 12/26/2022) 20 tablet 0  omeprazole (PRILOSEC) 20 MG DR capsule Take 1 capsule by mouth as needed (Patient not taking: Reported on 12/26/2022)  TRULICITY 3 mg/0.5 mL subcutaneous pen injector INJECT 3MG  UNDER SKIN ONCE A WEEK (Patient not taking: Reported on 12/26/2022)  valACYclovir (VALTREX) 500 MG tablet (Patient not taking: Reported on 12/26/2022)   Allergies: Ciprofloxacin, Erythromycin, Ibuprofen, Nsaids (non-steroidal anti-inflammatory drug), Enalapril maleate, Oxaprozin, and Rofecoxib  Past Medical History:  Allergic rhinitis  Anemia  Aneurysm (CMS-HCC)  Diverticular disease 10/17/2017  DJD (degenerative joint disease)  DM II (diabetes mellitus, type II), controlled (CMS/HHS-HCC)  Essential hypertension 07/08/2014  Fibrocystic breast disease  Gastritis  History of cancer  History of duodenal ulcer 08/02/2014  HLD (hyperlipidemia)  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  Lichen planus  Lichen planus of scalp  Osteopenia 10/04/2014  DEXA 07/2014: T -1.1 08/2016: T-1.2  Plantar fasciitis  Plantar fibromatosis  Sleep apnea  Type 2 diabetes mellitus (CMS/HHS-HCC) 02/15/2014  Vitamin D deficiency 10/11/2016  Last Assessment & Plan: Resolved.   Past Surgical History: RHINOPLASTY 1968  JOINT REPLACEMENT Right 04/20/2013  MAKO Restoris total hip replacement  CHOLECYSTECTOMY 07/08/2016  ENDOSCOPY BILE DUCT 2018  COLONOSCOPY 2019  JOINT  REPLACEMENT Left 03/06/2018  MAKO left total knee  trigger release Left 08/24/2018  index finger  Bilateral breast implants Bilateral 03/08/2019  removal  bilateral mastectomy for cancer 03/08/2019   INCISION TENDON SHEATH FOR TRIGGER FINGER Right 11/14/2021  Right index by Dr. Rosita Kea  INCISION TENDON SHEATH FOR TRIGGER FINGER Right 11/14/2021  Right index finger by Dr. Rosita Kea  DILATION AND CURETTAGE, DIAGNOSTIC / THERAPEUTIC  INCISION TENDON SHEATH FOR TRIGGER FINGER  Bilateral  OTHER SURGICAL HISTORY (Breast Implants) portacath placement Right   Social History:  Socioeconomic History:  Marital status: Married  Spouse name: Debroah Loop  Number of children: 2  Occupational History  Occupation: Retired Cabin crew part time Audiological scientist  Tobacco Use  Smoking status: Never  Smokeless tobacco: Never  Vaping Use  Vaping status: Never Used  Substance and Sexual Activity  Alcohol use: Not Currently  Drug use: No  Sexual activity: Not Currently   Social Determinants of Health:   Financial Resource Strain: Low Risk (12/05/2022)  Received from [facility]  Overall Financial Resource Strain (CARDIA)  Difficulty of Paying Living Expenses: Not hard at all  Food Insecurity: No Food Insecurity (12/05/2022)  Received from [facility]  Hunger Vital Sign  Worried About Running Out of Food in the Last Year: Never true  Ran Out of Food in the Last Year: Never true  Transportation Needs: No Transportation Needs (12/05/2022)  Received from [facility] - Transportation  Lack of Transportation (Medical): No  Lack of Transportation (Non-Medical): No  Physical Activity: Sufficiently Active (12/05/2022)  Received from [facility]  Exercise Vital Sign  Days of Exercise per Week: 3 days  Minutes of Exercise per Session: 60 min  Stress: No Stress Concern Present (12/05/2022)  Received from [facility] Occupational Health - Occupational Stress Questionnaire  Feeling of Stress : Not at all  Social Connections: Unknown (12/05/2022)  Received from [facility]  Social Connection and Isolation Panel [NHANES]  Frequency of Communication with Friends and Family: Once a  week  Frequency of Social Gatherings with Friends and Family: Once a week  Attends Religious Services: More than 4 times per year  Active Member of [employer] or Organizations: Patient declined  Marital Status: Married   Family History: Diabetes Mother  Myocardial Infarction (Heart attack) Mother  Coronary Artery Disease (Blocked arteries around heart) Father  Diabetes Father  Diabetes type II Father  Myocardial Infarction (Heart attack) Father  Breast cancer Sister  Obesity Brother  High blood pressure (Hypertension) Brother  Hyperlipidemia (Elevated cholesterol) Brother  Coronary Artery Disease (Blocked arteries around heart) Brother  Anesthesia problems Neg Hx  Malignant hypertension Neg Hx  Malignant hyperthermia Neg Hx  Pseudochol deficiency Neg Hx  PONV Neg Hx   Review of Systems: A comprehensive 14 point ROS was performed, reviewed, and the pertinent orthopaedic findings are documented in the HPI.  Physical Exam: BP 132/58  Ht 157.5 cm (5\' 2" )  Wt 75.8 kg (167 lb)  BMI 30.54 kg/m   General/Constitutional: NAD, conversant Eyes: Pupils equal and round, extraocular movements intact ENT: atraumatic external nose and ears, moist mucous membranes Respiratory: non-labored breathing, symmetric chest rise, chest sounds clear. Cardiovascular: no visible lower extremity edema, peripheral pulses present, regular rate and rhythm  Skin: normal skin turgor, warm and dry Neurological: cranial nerves grossly intact, sensation grossly intact Psychological: Appropriate mood and affect; appropriate judgment Musculoskeletal: as detailed below:  General: Well developed, well nourished 77 y.o. female in no apparent distress. Normal  affect. Normal communication. Patient answers questions appropriately. The patient has a right-sided limping gait. There is no antalgic component. There is no hip lurch.   Heart: Examination of the heart reveals regular, rate, and rhythm. There is no murmur  noted on ascultation. There is a normal apical pulse.  Lungs: Lungs are clear to auscultation. There is no wheeze, rhonchi, or crackles. There is normal expansion of bilateral chest walls.   Right lower extremity: Skin examination of the right knee demonstrates no open wound, erythema or ecchymosis. The patient does not have any significant swelling on today's x-ray. Patient is able to fully extend the right knee is able to fully flex the right knee with increased discomfort with knee flexion. The patient does not have any significant crepitus with range of motion activities. The patient does have a positive McMurray's test. She is nontender palpation along the lateral knee joint line. Moderate tenderness with palpation along the medial joint line at today's visit. The patient has a negative Lachman's test of the right lower extremity. Negative varus and valgus stress test of the right lower extremity at today's visit.   Imaging: Xrays including AP, lateral,sunrise, weight bearing knee X-rays (3 views) of the right knee AP and sunrise of the left knee, ordered and taken today in the office show right knee with mild medial joint space narrowing sclerosis , minimal osteophyte formation . Mild narrowing of the right patellofemoral joint overall mild osteoarthritic changes . Left knee is status post total knee replacement with components in appropriate appearing position no obvious signs of loosening or movement compared to prior films .   MRI OF THE RIGHT KNEE WITHOUT CONTRAST:  1. Complex tearing of the medial meniscus.  2. Moderate medial compartment osteoarthritis with full-thickness  cartilage defect of the medial femoral condyle measuring 10 x 10 mm.  3. Small joint effusion.   Both the MRI reports and images were discussed in detail with the patient and her husband at today's visit.   Impression: 1. Primary osteoarthritis of right knee.  2. Tear of medial meniscus of right knee.   Plan:   1. I discussed the physical exam finding as well as the upcoming surgery. 2. I reviewed doing a right medial partial knee replacement. I reviewed the risk, benefits, and alternatives to the surgery. The patient seems understand and would like to set up for Dec 31, 2022. 3. The patient will discontinue all medications as told by [facility]. 4. The patient follow-up after surgery for postoperative care.  The expected discharge plan is for the patient to go home with home health physical therapy the day of surgery and possibly postop day 1. She will then transition to outpatient physical therapy.  This note will serve as the history and physical for surgery scheduled on Dec 31, 2022 for a right medial partial knee replacement to be done by Dr. Joice Lofts.    H&P reviewed and patient re-examined. No changes.

## 2022-12-31 NOTE — Op Note (Signed)
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  Post-Op Diagnosis:   Same  Procedure:   Right unicondylar knee arthroplasty.  Surgeon:   Maryagnes Amos, MD  Assistant:   Horris Latino, PA-C  Anesthesia:   Spinal  Findings:   As above.  Complications:   None  EBL:   5 cc  Fluids:   1000 cc crystalloid  UOP:   None  TT:   75 minutes at 300 mmHg  Drains:   None  Closure:   Staples  Implants:   All-cemented Biomet Oxford system with an extra small femoral component, an "A" sized tibial tray, and a 4 mm meniscal bearing insert.  Brief Clinical Note:   The patient is a 77 year old female with a history of gradually worsening medial sided right knee pain. Her symptoms have progressed despite medications, activity modification, etc. Her history and examination are consistent with a posterior root tear of the medial meniscus with degenerative joint disease, primarily involving the medial compartment, all of which were confirmed by MRI scan. The patient presents at this time for a right partial knee replacement.  Procedure:   The patient was brought into the operating room and a spinal placed by the anesthesiologist. The patient was repositioned so that the non-surgical leg was placed in a flexed and abducted position in the yellow fin leg holder while the surgical extremity was placed over the Biomet leg holder. The right lower extremity was prepped with ChloraPrep solution before being draped sterilely. Preoperative antibiotics were administered. A timeout was performed to verify the appropriate surgical site before the limb was exsanguinated with an Esmarch and the tourniquet inflated to 300 mmHg.   A standard anterior approach to the knee was made through an approximately 3.5-4 inch incision. The incision was carried down through the subcutaneous tissues to expose the superficial  retinaculum. This was split the length the incision and the medial flap elevated sufficiently to expose the medial retinaculum. The medial retinaculum was incised along the medial border of the patella tendon and extended proximally along the medial border of the patella, leaving a 3-4 mm cuff of tissue. The soft tissues were elevated off the anteromedial aspect of the proximal tibia. The anterior portion of the meniscus was removed after performing a subtotal excision of the infrapatellar fat pad. The anterior cruciate ligament was inspected and found to be in excellent condition. Osteophytes were removed from the inferior pole of the patella as well as from the notch using a quarter-inch osteotome.   The medial femoral condyle was sized using the small and extra small sizers. It was felt that the extra small guide best optimized the contour of the femur. This was left in place and the external tibial guide positioned. The coupling device was used to connect the guide to the medial femoral condylar sizer to optimize appropriate orientation. Two guide pins were inserted into the cutting block before the coupling device and sizer were removed. The appropriate tibial cut was made using the oscillating and reciprocating saws. The piece was removed in its entirety and taken to the back table where it was sized and found to be optimally replicated by an "A" sized component. The 9 mm spacer was inserted to verify that sufficient bone had been removed.  Attention was directed to femoral side. The intramedullary canal was accessed through a 4 mm drill hole. The  intramedullary guide was positioned before the guide for the femoral condylar holes was positioned. The appropriate coupling device connected this guide to the intramedullary guide before both drill holes were created in the distal aspect of the medial femoral condyle. The devices were removed and the posterior condylar cutting block inserted. The appropriate cut  was made using the reciprocating saw and this piece removed. The #0 spigot was inserted and the initial bone milling performed. A trial femoral component was inserted and both the flexion and extension gaps measured. In flexion, the gap measured 7 mm whereas in extension, it measured 4 mm. Therefore, the #3 spigot was selected and the secondary bone milling performed. Repeat sizing demonstrated symmetric flexion and extension gaps. The bone was removed from the postero-medial and postero-lateral aspects of the femoral condyle, as well as from the beneath the collar of the spigot. Bone also was removed from the anterior portion of the femur so as to minimize any potential impingement with the meniscal bearing insert. The trial components removed and several drill holes placed into the distal femoral condyle to further augment cement fixation.  Attention was redirected to the tibial side. The "A" sized tibial tray was positioned and temporarily secured using the appropriate spiked nail. The keel was created using the bi-bladed reciprocating saw and hoe. The keeled "A" sized trial tibial tray was inserted to be sure that it seated properly. At this point, a total of 20 cc of Exparel, 2 cc of Kenalog 40 (80 mg), 30 mg of Toradol, and 30 cc of 0.5% Sensorcaine diluted out to 60 cc with normal saline was injected in and around the posterior and medial capsular tissues, as well as the peri-incisional tissues to help with postoperative pain control.  The bony surfaces were prepared for cementing by irrigating them thoroughly with bacitracin saline solution using the jet lavage system before packing them with a dry Ray-Tec sponge. Meanwhile, cement was being mixed on the back table. When the cement was ready, the tibial tray was cemented in first. The excess cement was removed using a Public house manager after impacting it into place. Next, the femoral component was impacted into place. Again the excess cement was removed  using a Public house manager. The 4 mm spacer was inserted and the knee brought into near full extension while the cement hardened. Once the cement hardened, the spacer was removed and the 4 mm meniscal bearing insert was trialed. This demonstrated excellent tracking while the knee was placed through a range of motion, and showed no evidence towards subluxation or dislocation. In addition, it did not fit too tightly. Therefore, the permanent 4 mm meniscal bearing insert was snapped into position after verifying that no cement had been retained posteriorly. Again the knee was placed through a range of motion with the findings as described above.  The wound was copiously irrigated with sterile saline solution via the jet lavage system before the retinacular layer was reapproximated using #0 Vicryl interrupted sutures. At this point, 1 g of transexemic acid in 10 cc of normal saline was injected intra-articularly. The subcutaneous tissues were closed in two layers using 2-0 Vicryl interrupted sutures before the skin was closed using staples. A sterile occlusive dressing was applied to the knee before the patient was awakened. The patient was transferred back to his/her hospital bed and returned to the recovery room in satisfactory condition after tolerating the procedure well. A Polar Care device was applied to the knee as well.

## 2022-12-31 NOTE — Transfer of Care (Signed)
Immediate Anesthesia Transfer of Care Note  Patient: Katie [name]  Procedure(s) Performed: RIGHT PARTIAL KNEE ARTHROPLASTY (Right: Knee)  Patient Location: PACU  Anesthesia Type:Spinal  Level of Consciousness: awake, alert , and oriented  Airway & Oxygen Therapy: Patient Spontanous Breathing and Patient connected to nasal cannula oxygen  Post-op Assessment: Report given to RN and Post -op Vital signs reviewed and stable  Post vital signs: Reviewed and stable  Last Vitals:  Vitals Value Taken Time  BP 131/55 12/31/22 0937  Temp 36.3 C 12/31/22 0934  Pulse 50 12/31/22 0938  Resp 22 12/31/22 0938  SpO2 99 % 12/31/22 0938  Vitals shown include unvalidated device data.  Last Pain:  Vitals:   12/31/22 0633  TempSrc: Temporal  PainSc: 7          Complications: No notable events documented.

## 2023-01-01 ENCOUNTER — Other Ambulatory Visit: Payer: Self-pay | Admitting: Hematology and Oncology

## 2023-01-01 ENCOUNTER — Encounter: Payer: Self-pay | Admitting: Surgery

## 2023-01-01 NOTE — Anesthesia Postprocedure Evaluation (Signed)
Anesthesia Post Note  Patient: Katie [name]  Procedure(s) Performed: RIGHT PARTIAL KNEE ARTHROPLASTY (Right: Knee)  Patient location during evaluation: PACU Anesthesia Type: Spinal Level of consciousness: oriented and awake and alert Pain management: pain level controlled Vital Signs Assessment: post-procedure vital signs reviewed and stable Respiratory status: spontaneous breathing, respiratory function stable and patient connected to nasal cannula oxygen Cardiovascular status: blood pressure returned to baseline and stable Postop Assessment: no headache, no backache and no apparent nausea or vomiting Anesthetic complications: no Comments: Discharged prior to being able to be evaluated postop. No documented issues in notes, vitals stable.   No notable events documented.   Last Vitals:  Vitals:   12/31/22 1230 12/31/22 1521  BP: (!) 107/47 133/61  Pulse: 64 65  Resp: 16 18  Temp:  (!) 36 C  SpO2: 96% 97%    Last Pain:  Vitals:   12/31/22 1521  TempSrc: Temporal  PainSc: 6                  Corinda Gubler

## 2023-01-02 DIAGNOSIS — E559 Vitamin D deficiency, unspecified: Secondary | ICD-10-CM | POA: Diagnosis not present

## 2023-01-02 DIAGNOSIS — N6019 Diffuse cystic mastopathy of unspecified breast: Secondary | ICD-10-CM | POA: Diagnosis not present

## 2023-01-02 DIAGNOSIS — D649 Anemia, unspecified: Secondary | ICD-10-CM | POA: Diagnosis not present

## 2023-01-02 DIAGNOSIS — Z79899 Other long term (current) drug therapy: Secondary | ICD-10-CM | POA: Diagnosis not present

## 2023-01-02 DIAGNOSIS — Z471 Aftercare following joint replacement surgery: Secondary | ICD-10-CM | POA: Diagnosis not present

## 2023-01-02 DIAGNOSIS — K5790 Diverticulosis of intestine, part unspecified, without perforation or abscess without bleeding: Secondary | ICD-10-CM | POA: Diagnosis not present

## 2023-01-02 DIAGNOSIS — M1990 Unspecified osteoarthritis, unspecified site: Secondary | ICD-10-CM | POA: Diagnosis not present

## 2023-01-02 DIAGNOSIS — Z96651 Presence of right artificial knee joint: Secondary | ICD-10-CM | POA: Diagnosis not present

## 2023-01-02 DIAGNOSIS — G4730 Sleep apnea, unspecified: Secondary | ICD-10-CM | POA: Diagnosis not present

## 2023-01-02 DIAGNOSIS — M8580 Other specified disorders of bone density and structure, unspecified site: Secondary | ICD-10-CM | POA: Diagnosis not present

## 2023-01-02 DIAGNOSIS — M722 Plantar fascial fibromatosis: Secondary | ICD-10-CM | POA: Diagnosis not present

## 2023-01-02 DIAGNOSIS — Z7901 Long term (current) use of anticoagulants: Secondary | ICD-10-CM | POA: Diagnosis not present

## 2023-01-02 DIAGNOSIS — Z96641 Presence of right artificial hip joint: Secondary | ICD-10-CM | POA: Diagnosis not present

## 2023-01-02 DIAGNOSIS — E119 Type 2 diabetes mellitus without complications: Secondary | ICD-10-CM | POA: Diagnosis not present

## 2023-01-02 DIAGNOSIS — I10 Essential (primary) hypertension: Secondary | ICD-10-CM | POA: Diagnosis not present

## 2023-01-02 DIAGNOSIS — Z96652 Presence of left artificial knee joint: Secondary | ICD-10-CM | POA: Diagnosis not present

## 2023-01-02 DIAGNOSIS — E785 Hyperlipidemia, unspecified: Secondary | ICD-10-CM | POA: Diagnosis not present

## 2023-01-02 LAB — TYPE AND SCREEN

## 2023-01-02 LAB — BPAM RBC: Blood Product Expiration Date: 202405162359

## 2023-01-04 DIAGNOSIS — Z96651 Presence of right artificial knee joint: Secondary | ICD-10-CM | POA: Diagnosis not present

## 2023-01-04 DIAGNOSIS — M722 Plantar fascial fibromatosis: Secondary | ICD-10-CM | POA: Diagnosis not present

## 2023-01-04 DIAGNOSIS — Z471 Aftercare following joint replacement surgery: Secondary | ICD-10-CM | POA: Diagnosis not present

## 2023-01-04 DIAGNOSIS — D649 Anemia, unspecified: Secondary | ICD-10-CM | POA: Diagnosis not present

## 2023-01-04 DIAGNOSIS — E119 Type 2 diabetes mellitus without complications: Secondary | ICD-10-CM | POA: Diagnosis not present

## 2023-01-04 DIAGNOSIS — I10 Essential (primary) hypertension: Secondary | ICD-10-CM | POA: Diagnosis not present

## 2023-01-06 ENCOUNTER — Telehealth: Payer: Self-pay | Admitting: Family Medicine

## 2023-01-06 DIAGNOSIS — E119 Type 2 diabetes mellitus without complications: Secondary | ICD-10-CM | POA: Diagnosis not present

## 2023-01-06 DIAGNOSIS — M722 Plantar fascial fibromatosis: Secondary | ICD-10-CM | POA: Diagnosis not present

## 2023-01-06 DIAGNOSIS — Z471 Aftercare following joint replacement surgery: Secondary | ICD-10-CM | POA: Diagnosis not present

## 2023-01-06 DIAGNOSIS — Z96651 Presence of right artificial knee joint: Secondary | ICD-10-CM | POA: Diagnosis not present

## 2023-01-06 DIAGNOSIS — D649 Anemia, unspecified: Secondary | ICD-10-CM | POA: Diagnosis not present

## 2023-01-06 DIAGNOSIS — I10 Essential (primary) hypertension: Secondary | ICD-10-CM | POA: Diagnosis not present

## 2023-01-06 NOTE — Telephone Encounter (Signed)
I spoke with pt; pt said that her ins co will not let her take 2 injections of 075 trulicity. Pt has not been able to find the 1.50 of trulicity. I called couple of pharmacies without success of finding the 1.5 Trulicity in stock. Pt wonders if could take a different med or add a different med. Pt said is watching closely a diabetic diet and drinking lots of water. Pt is not having jitteriness, sweats or confusion. Pt said the 250 - 300 BS were usually non fasting. 01/06/23 FBS 212.Pt request cb after review by Dr Ermalene Searing. Sending note to Dr Ermalene Searing and [place] pool. [facility] s Statistician.

## 2023-01-06 NOTE — Telephone Encounter (Signed)
Patient had knee surgery on last Tuesday.She stated that since then her blood sugar has been running between 250 and 300. She said that she took her .75 injection of trulicity on last Thursday. Patient would like to know if there something else to help get he blood sugar down,even if it something temporarily? She said that she's not experiencing any other symptoms such as blurred vision,fatigue or anything else,just the elevated blood sugars.

## 2023-01-07 ENCOUNTER — Other Ambulatory Visit: Payer: Self-pay | Admitting: Family Medicine

## 2023-01-07 ENCOUNTER — Encounter: Payer: Self-pay | Admitting: Family Medicine

## 2023-01-07 MED ORDER — EMPAGLIFLOZIN 10 MG PO TABS
10.0000 mg | ORAL_TABLET | Freq: Every day | ORAL | 11 refills | Status: DC
Start: 2023-01-07 — End: 2023-12-17

## 2023-01-07 NOTE — Telephone Encounter (Signed)
Given aortic atherosclerosis.[place] Pepper oral would be a good option for her as it helps decrease the risk of cardiovascular disease but no associated weight loss. If weight loss is still the major goal I would recommend Mounjaro or Ozempic weekly injectable and it also has some assocaited cardiac prevention. Given it would not be a good long-term plan lets hold off on doing a double dose of Trulicity.  Please ask patient if she has a preference between [place] or Mounjaro/Ozempic given above comments.

## 2023-01-07 NOTE — Telephone Encounter (Signed)
Also see recent MyChart message patient sent.

## 2023-01-07 NOTE — Telephone Encounter (Signed)
Ms. Biondi notified as instructed by telephone.  She states her main concern is how high her sugar is now.  She states she would not object going back on an oral medication since the injectables seem to be so hard to get.  She states Metformin and Jardiance both are some that is on her formulary.  She currently has 3 pens of the Trulicity 0.75 mg so she can double up on that if Dr. Ermalene Searing wants her to but once it is gone she won't be able to get any more.   Please advise.

## 2023-01-07 NOTE — Telephone Encounter (Signed)
We can change to Ozempic or Mounjaro if she is agreeable.  Have her call her pharmacy to verify she can get either 1 of these, let me know and I will send a new prescription

## 2023-01-07 NOTE — Progress Notes (Signed)
FYI, please discard previous note for call to patient has I have responded to her by MyChart.

## 2023-01-08 DIAGNOSIS — D649 Anemia, unspecified: Secondary | ICD-10-CM | POA: Diagnosis not present

## 2023-01-08 DIAGNOSIS — Z96651 Presence of right artificial knee joint: Secondary | ICD-10-CM | POA: Diagnosis not present

## 2023-01-08 DIAGNOSIS — I10 Essential (primary) hypertension: Secondary | ICD-10-CM | POA: Diagnosis not present

## 2023-01-08 DIAGNOSIS — M722 Plantar fascial fibromatosis: Secondary | ICD-10-CM | POA: Diagnosis not present

## 2023-01-08 DIAGNOSIS — Z471 Aftercare following joint replacement surgery: Secondary | ICD-10-CM | POA: Diagnosis not present

## 2023-01-08 DIAGNOSIS — E119 Type 2 diabetes mellitus without complications: Secondary | ICD-10-CM | POA: Diagnosis not present

## 2023-01-08 NOTE — Telephone Encounter (Signed)
Per Dr. Ermalene Searing she responded to patient with this information via MyChart.   Patient's response:  Yes, I can continue the.75 trulicity. I have 3 more vials. Will take one on Thursday but will pick up the prescription of Jardiance and take it now.

## 2023-01-10 DIAGNOSIS — I10 Essential (primary) hypertension: Secondary | ICD-10-CM | POA: Diagnosis not present

## 2023-01-10 DIAGNOSIS — Z96651 Presence of right artificial knee joint: Secondary | ICD-10-CM | POA: Diagnosis not present

## 2023-01-10 DIAGNOSIS — Z471 Aftercare following joint replacement surgery: Secondary | ICD-10-CM | POA: Diagnosis not present

## 2023-01-10 DIAGNOSIS — D649 Anemia, unspecified: Secondary | ICD-10-CM | POA: Diagnosis not present

## 2023-01-10 DIAGNOSIS — M722 Plantar fascial fibromatosis: Secondary | ICD-10-CM | POA: Diagnosis not present

## 2023-01-10 DIAGNOSIS — E119 Type 2 diabetes mellitus without complications: Secondary | ICD-10-CM | POA: Diagnosis not present

## 2023-01-13 DIAGNOSIS — E119 Type 2 diabetes mellitus without complications: Secondary | ICD-10-CM | POA: Diagnosis not present

## 2023-01-13 DIAGNOSIS — Z471 Aftercare following joint replacement surgery: Secondary | ICD-10-CM | POA: Diagnosis not present

## 2023-01-13 DIAGNOSIS — M722 Plantar fascial fibromatosis: Secondary | ICD-10-CM | POA: Diagnosis not present

## 2023-01-13 DIAGNOSIS — D649 Anemia, unspecified: Secondary | ICD-10-CM | POA: Diagnosis not present

## 2023-01-13 DIAGNOSIS — Z96651 Presence of right artificial knee joint: Secondary | ICD-10-CM | POA: Diagnosis not present

## 2023-01-13 DIAGNOSIS — I10 Essential (primary) hypertension: Secondary | ICD-10-CM | POA: Diagnosis not present

## 2023-01-15 DIAGNOSIS — M25561 Pain in right knee: Secondary | ICD-10-CM | POA: Diagnosis not present

## 2023-01-15 DIAGNOSIS — Z96651 Presence of right artificial knee joint: Secondary | ICD-10-CM | POA: Diagnosis not present

## 2023-01-17 DIAGNOSIS — M25561 Pain in right knee: Secondary | ICD-10-CM | POA: Diagnosis not present

## 2023-01-17 DIAGNOSIS — Z96651 Presence of right artificial knee joint: Secondary | ICD-10-CM | POA: Diagnosis not present

## 2023-01-20 DIAGNOSIS — Z471 Aftercare following joint replacement surgery: Secondary | ICD-10-CM | POA: Diagnosis not present

## 2023-01-21 DIAGNOSIS — Z96651 Presence of right artificial knee joint: Secondary | ICD-10-CM | POA: Diagnosis not present

## 2023-01-21 DIAGNOSIS — M25561 Pain in right knee: Secondary | ICD-10-CM | POA: Diagnosis not present

## 2023-01-24 DIAGNOSIS — Z96651 Presence of right artificial knee joint: Secondary | ICD-10-CM | POA: Diagnosis not present

## 2023-01-28 ENCOUNTER — Encounter: Payer: Self-pay | Admitting: Internal Medicine

## 2023-01-28 ENCOUNTER — Ambulatory Visit (INDEPENDENT_AMBULATORY_CARE_PROVIDER_SITE_OTHER): Payer: Medicare Other | Admitting: Internal Medicine

## 2023-01-28 VITALS — BP 128/58 | HR 100 | Ht 62.0 in | Wt 162.8 lb

## 2023-01-28 DIAGNOSIS — K5900 Constipation, unspecified: Secondary | ICD-10-CM

## 2023-01-28 DIAGNOSIS — Z86010 Personal history of colonic polyps: Secondary | ICD-10-CM

## 2023-01-28 DIAGNOSIS — M25561 Pain in right knee: Secondary | ICD-10-CM | POA: Diagnosis not present

## 2023-01-28 DIAGNOSIS — Z96651 Presence of right artificial knee joint: Secondary | ICD-10-CM | POA: Diagnosis not present

## 2023-01-28 MED ORDER — NA SULFATE-K SULFATE-MG SULF 17.5-3.13-1.6 GM/177ML PO SOLN: ORAL | 0 refills | Status: DC

## 2023-01-28 NOTE — Progress Notes (Signed)
Chief Complaint: Colon cancer screening  HPI : 77 year old with history of DM, GERD, CAD, breast cancer s/p surgery/chemo/radiation, asthma, PUD, CAD presents to discuss colon cancer screening  Interval History: She had a partial knee surgery done last month and is recovering well from that. She used to take Miralax regularly and now she can get by with using Miralax 1-2 times per week. She will also take Metamucil. Denies any RUQ ab pain with the use of Miralax. She still has some bloating. She has 1-3 BMs per day. When it gets hard to have a BM, she will take the Miralax. She has been drinking water. She is on Eliquis therapy but will stop today for DVT prophylaxis. She will then be on a baby aspirin daily.  Wt Readings from Last 3 Encounters:  01/28/23 162 lb 12.8 oz (73.8 kg)  12/31/22 169 lb 8.5 oz (76.9 kg)  12/23/22 169 lb 8.5 oz (76.9 kg)     Her ab pain with epigastric and RUQ ab pain have now resolved with MiraLAX therapy. She does still have some bloating issues while taking the Miralax, though she thinks this also may be potentially food related or due to her CPAP use. She is having BMs about 2-3 times per day, which is increased from 1 time per day prior to starting MiraLAX.  She has been trying to lose weight but would appreciate more guidance.  She has been having some issues with acid reflux.  Wt Readings from Last 3 Encounters:  01/28/23 162 lb 12.8 oz (73.8 kg)  12/31/22 169 lb 8.5 oz (76.9 kg)  12/23/22 169 lb 8.5 oz (76.9 kg)   Current Outpatient Medications  Medication Sig Dispense Refill   alendronate (FOSAMAX) 70 MG tablet TAKE 1 TABLET(70 MG) BY MOUTH 1 TIME A WEEK WITH A FULL GLASS OF WATER AND ON AN EMPTY STOMACH 12 tablet 3   anastrozole (ARIMIDEX) 1 MG tablet TAKE 1 TABLET(1 MG) BY MOUTH DAILY 90 tablet 3   atorvastatin (LIPITOR) 20 MG tablet Take 1 tablet (20 mg total) by mouth daily. 90 tablet 2   Calcium Carbonate-Vit D-Min (CALTRATE 600+D PLUS MINERALS)  600-800 MG-UNIT TABS Take 1 tablet by mouth in the morning and at bedtime.     clobetasol (TEMOVATE) 0.05 % external solution Apply 1 application topically 2 (two) times daily as needed (scalp). Reported on 08/16/2015     empagliflozin (JARDIANCE) 10 MG TABS tablet Take 1 tablet (10 mg total) by mouth daily before breakfast. 30 tablet 11   fenofibrate (TRICOR) 145 MG tablet Take 1 tablet (145 mg total) by mouth daily. 90 tablet 2   fluticasone (FLONASE) 50 MCG/ACT nasal spray SHAKE LIQUID AND USE 2 SPRAYS IN EACH NOSTRIL EVERY DAY 16 g 11   gabapentin (NEURONTIN) 300 MG capsule TAKE 1 CAPSULE(300 MG) BY MOUTH AT BEDTIME 90 capsule 3   glucose blood (FREESTYLE TEST STRIPS) test strip USE TO CHECK BLOOD SUGAR DAILY 50 strip 11   loratadine (CLARITIN) 10 MG tablet Take 10 mg by mouth daily.     losartan-hydrochlorothiazide (HYZAAR) 100-25 MG tablet TAKE 1 TABLET BY MOUTH DAILY 90 tablet 1   Multiple Vitamins-Minerals (CENTRUM SILVER 50+WOMEN) TABS Take 1 tablet by mouth daily.     Polyethyl Glycol-Propyl Glycol (SYSTANE OP) Apply to eye as needed.     Probiotic Product (PROBIOTIC DAILY PO) Take by mouth. Schiff Digestive Advantage     psyllium (METAMUCIL) 58.6 % powder Take 1 packet by mouth daily.  PULMICORT FLEXHALER 180 MCG/ACT inhaler INHALE 2 PUFFS INTO THE LUNGS TWICE DAILY 1 each 11   TRULICITY 0.75 MG/0.5ML SOPN Inject 0.75 mg as directed once a week. 2 mL 5   valACYclovir (VALTREX) 500 MG tablet Take 1 tablet (500 mg total) by mouth 2 (two) times daily.     apixaban (ELIQUIS) 2.5 MG TABS tablet Take 1 tablet (2.5 mg total) by mouth 2 (two) times daily. (Patient not taking: Reported on 01/28/2023) 30 tablet 0   No current facility-administered medications for this visit.   Review of Systems: All systems reviewed and negative except where noted in HPI.   Physical Exam: BP (!) 128/58   Pulse 100   Ht 5\' 2"  (1.575 m)   Wt 162 lb 12.8 oz (73.8 kg)   LMP 08/26/1992 (Approximate)   SpO2  96%   BMI 29.78 kg/m  Constitutional: Pleasant,well-developed, female in no acute distress. HEENT: Normocephalic and atraumatic. Conjunctivae are normal. No scleral icterus. Cardiovascular: Normal rate, regular rhythm.  Pulmonary/chest: Effort normal and breath sounds normal. No wheezing, rales or rhonchi. Abdominal: Soft, nondistended, non-tender Extremities: No edema Neurological: Alert and oriented to person place and time. Skin: Skin is warm and dry. No rashes noted. Psychiatric: Normal mood and affect. Behavior is normal.  Labs 02/2021: Vit B12 nml  Labs 04/2021: CMP with nml LFTs.   Labs 05/2021: CBC with dec WBC of 3.2, mild anemia with Hb of 11, platelets 237. Ferritin 527, iron/TIBC ratio is 103/527  Labs 11/2022: CBC is stable with low WBC and low Hb of 11.4. CMP with mildly elevated Cr of 1.05  MRCP 06/08/19: IMPRESSION: 1. No CT findings to explain the patient's history of pain. 2. Status post cholecystectomy. No intra or extrahepatic biliary duct dilatation. No choledocholithiasis.  CT A/P w/contrast 12/08/19: IMPRESSION: 1. Status post bilateral modified radical mastectomy with probable seromas on the left chest wall in the mastectomy site and in the left axillary region, as above. No definite findings to suggest metastatic disease in the chest, abdomen or pelvis. 2. Postradiation changes in the subpleural aspect of the left upper lobe deep to the left breast. 3. Scattered areas of cylindrical bronchiectasis in the lungs bilaterally with some chronic mucoid impaction within the medial right lower lobe. 4. Aortic atherosclerosis, in addition to left main and 2 vessel coronary artery disease. Assessment for potential risk factor modification, dietary therapy or pharmacologic therapy may be warranted, if clinically indicated. 5. Colonic diverticulosis without evidence of acute diverticulitis at this time. 6. Additional incidental findings, as above.  CT A/P  w/contrast 08/09/21: IMPRESSION: 1. Mild diffuse fatty infiltration of liver. 2. Stable hypodense lesion in the inferior right lobe of the liver favored as small cyst. No new liver lesions. 3. Colonic diverticulosis without evidence for acute diverticulitis. 4. Unchanged likely postoperative seroma anterior left chest wall.  Gastric emptying study 10/10/21: IMPRESSION: Normal gastric emptying study.  EGD 12/05/16: - Normal examined duodenum. - Normal esophagus. - Gastritis. Biopsied. - A single gastric polyp. Resected and retrieved. Path: DIAGNOSIS:  A. STOMACH POLYP, FUNDUS; COLD BIOPSY:  - FEATURES CONSISTENT WITH A BENIGN XANTHOMA, SEE NOTE.  - NEGATIVE FOR H. PYLORI, DYSPLASIA AND MALIGNANCY.  B.  STOMACH, RANDOM; COLD BIOPSY:  - MINIMAL TO MILD CHRONIC GASTRITIS.  - NEGATIVE FOR H. PYLORI, DYSPLASIA AND MALIGNANCY.  Note: PAS and cytokeratin immunostains are negative with appropriate  controls.   Colonoscopy 11/07/17: - Diverticulosis in the sigmoid colon. - Two 4 to 6 mm polyps in the  ascending colon, removed with a cold snare. Resected and retrieved. - One 5 mm polyp in the sigmoid colon, removed with a cold snare. Resected and retrieved. - The examination was otherwise normal on direct and retroflexion views. Path: A. COLON POLYP X2, ASCENDING; COLD SNARE:  - TUBULAR ADENOMA (1).  - POLYPOID FRAGMENT OF MILDLY INFLAMED COLONIC MUCOSA (1).  - NEGATIVE FOR HIGH GRADE DYSPLASIA AND MALIGNANCY.  B.  COLON POLYP, SIGMOID; COLD SNARE:  - TUBULAR ADENOMA.  - NEGATIVE FOR HIGH GRADE DYSPLASIA AND MALIGNANCY.   EGD 09/11/21: - Normal esophagus. - A medium amount of food (residue) in the stomach. - Normal examined duodenum. Biopsied. - Biopsies were taken with a cold forceps for histology on the greater curvature of the gastric body, on the lesser curvature of the gastric body, at the incisura, on the greater curvature of the gastric antrum and on the lesser curvature of the  gastric antrum. Path: 1. Surgical [P], duodenal FRAGMENTS OF DUODENAL MUCOSA WITH NORMAL VILLOUS AND CRYPT ARCHITECTURE. MILD INFLAMMATION IS SEEN BUT NO INCREASE IN INTRAEPITHELIAL LYMPHOCYTES IS NOTED. NO DYSPLASIA OR MALIGNANCY IS SEEN. NO HISTOLOGIC EVIDENCE OF CELIAC DISEASE IS SEEN. 2. Surgical [P], gastric MILD CHRONIC GASTRITIS. NO SIGNIFICANT ACTIVITY, INTESTINAL METAPLASIA, DYSPLASIA, H. PYLORI-LIKE ORGANISMS OR MALIGNANCY IS SEEN ON H&E STAIN. CLINICAL AND ENDOSCOPIC CORRELATION IS REQUIRED.  ASSESSMENT AND PLAN: Constipation Bloating Gastritis Fatty liver disease History of colon polyps GERD Patient overall is doing well on Miralax therapy. Her abdominal pain has resolved with the treatment of her constipation. She is due for a colonoscopy for polyp surveillance so will get her scheduled for this.  - Previously gave low FODMAP diet handout - Continue Metamucil QD and Miralax PRN - Encourage weight loss - Colonoscopy [facility] in 03/2023 to allow her some time to recover from her recent knee surgery  Eulah Pont, MD  I spent 34 minutes of time, including in depth chart review, independent review of results as outlined above, communicating results with the patient directly, face-to-face time with the patient, coordinating care, ordering studies and medications as appropriate, and documentation.

## 2023-01-28 NOTE — Patient Instructions (Addendum)
You have been scheduled for a colonoscopy. Please follow written instructions given to you at your visit today.  Please pick up your prep supplies at the pharmacy within the next 1-3 days. If you use inhalers (even only as needed), please bring them with you on the day of your procedure.   We have sent the following medications to your pharmacy for you to pick up at your convenience: Suprep  Do not take Trulicity 1 week prior to procedure  If your blood pressure at your visit was 140/90 or greater, please contact your primary care physician to follow up on this.  _______________________________________________________  If you are age 78 or older, your body mass index should be between 23-30. Your Body mass index is 29.78 kg/m. If this is out of the aforementioned range listed, please consider follow up with your Primary Care Provider.  If you are age 78 or younger, your body mass index should be between 19-25. Your Body mass index is 29.78 kg/m. If this is out of the aformentioned range listed, please consider follow up with your Primary Care Provider.   ________________________________________________________  The [facility] providers would like to encourage you to use [facility] to communicate with providers for non-urgent requests or questions.  Due to long hold times on the telephone, sending your provider a message by [facility] may be a faster and more efficient way to get a response.  Please allow 48 business hours for a response.  Please remember that this is for non-urgent requests.  _______________________________________________________   Due to recent changes in healthcare laws, you may see the results of your imaging and laboratory studies on MyChart before your provider has had a chance to review them.  We understand that in some cases there may be results that are confusing or concerning to you. Not all laboratory results come back in the same time frame and the provider may be  waiting for multiple results in order to interpret others.  Please give [place] 48 hours in order for your provider to thoroughly review all the results before contacting the office for clarification of your results.    Thank you for entrusting me with your care and for choosing [facility], Dr. Eulah Pont

## 2023-01-30 DIAGNOSIS — M25561 Pain in right knee: Secondary | ICD-10-CM | POA: Diagnosis not present

## 2023-01-30 DIAGNOSIS — Z96651 Presence of right artificial knee joint: Secondary | ICD-10-CM | POA: Diagnosis not present

## 2023-02-04 DIAGNOSIS — Z96651 Presence of right artificial knee joint: Secondary | ICD-10-CM | POA: Diagnosis not present

## 2023-02-04 DIAGNOSIS — M25561 Pain in right knee: Secondary | ICD-10-CM | POA: Diagnosis not present

## 2023-02-05 DIAGNOSIS — Z170 Estrogen receptor positive status [ER+]: Secondary | ICD-10-CM | POA: Diagnosis not present

## 2023-02-05 DIAGNOSIS — C50412 Malignant neoplasm of upper-outer quadrant of left female breast: Secondary | ICD-10-CM | POA: Diagnosis not present

## 2023-02-06 DIAGNOSIS — M25561 Pain in right knee: Secondary | ICD-10-CM | POA: Diagnosis not present

## 2023-02-06 DIAGNOSIS — Z96651 Presence of right artificial knee joint: Secondary | ICD-10-CM | POA: Diagnosis not present

## 2023-02-10 DIAGNOSIS — M1711 Unilateral primary osteoarthritis, right knee: Secondary | ICD-10-CM | POA: Diagnosis not present

## 2023-02-10 DIAGNOSIS — Z96651 Presence of right artificial knee joint: Secondary | ICD-10-CM | POA: Diagnosis not present

## 2023-02-11 DIAGNOSIS — M25561 Pain in right knee: Secondary | ICD-10-CM | POA: Diagnosis not present

## 2023-02-11 DIAGNOSIS — Z96651 Presence of right artificial knee joint: Secondary | ICD-10-CM | POA: Diagnosis not present

## 2023-02-13 DIAGNOSIS — M25561 Pain in right knee: Secondary | ICD-10-CM | POA: Diagnosis not present

## 2023-02-13 DIAGNOSIS — Z96651 Presence of right artificial knee joint: Secondary | ICD-10-CM | POA: Diagnosis not present

## 2023-02-18 DIAGNOSIS — Z96651 Presence of right artificial knee joint: Secondary | ICD-10-CM | POA: Diagnosis not present

## 2023-02-18 DIAGNOSIS — M25561 Pain in right knee: Secondary | ICD-10-CM | POA: Diagnosis not present

## 2023-03-03 ENCOUNTER — Other Ambulatory Visit: Payer: Self-pay | Admitting: Hematology and Oncology

## 2023-03-03 ENCOUNTER — Other Ambulatory Visit: Payer: Self-pay | Admitting: Family Medicine

## 2023-03-07 ENCOUNTER — Ambulatory Visit: Payer: Medicare Other | Admitting: Family Medicine

## 2023-03-07 ENCOUNTER — Encounter: Payer: Self-pay | Admitting: Family Medicine

## 2023-03-07 VITALS — BP 120/60 | HR 80 | Temp 97.8°F | Ht 62.0 in | Wt 161.5 lb

## 2023-03-07 DIAGNOSIS — E1159 Type 2 diabetes mellitus with other circulatory complications: Secondary | ICD-10-CM

## 2023-03-07 DIAGNOSIS — I152 Hypertension secondary to endocrine disorders: Secondary | ICD-10-CM | POA: Diagnosis not present

## 2023-03-07 DIAGNOSIS — R300 Dysuria: Secondary | ICD-10-CM | POA: Diagnosis not present

## 2023-03-07 DIAGNOSIS — Z7985 Long-term (current) use of injectable non-insulin antidiabetic drugs: Secondary | ICD-10-CM

## 2023-03-07 DIAGNOSIS — E113293 Type 2 diabetes mellitus with mild nonproliferative diabetic retinopathy without macular edema, bilateral: Secondary | ICD-10-CM

## 2023-03-07 DIAGNOSIS — Z7984 Long term (current) use of oral hypoglycemic drugs: Secondary | ICD-10-CM

## 2023-03-07 LAB — POC URINALSYSI DIPSTICK (AUTOMATED)
Bilirubin, UA: NEGATIVE
Blood, UA: NEGATIVE
Glucose, UA: POSITIVE — AB
Ketones, UA: NEGATIVE
Leukocytes, UA: NEGATIVE
Nitrite, UA: NEGATIVE
Protein, UA: NEGATIVE
Spec Grav, UA: 1.015 (ref 1.010–1.025)
Urobilinogen, UA: 0.2 E.U./dL
pH, UA: 6 (ref 5.0–8.0)

## 2023-03-07 LAB — POCT GLYCOSYLATED HEMOGLOBIN (HGB A1C): Hemoglobin A1C: 6.4 % — AB (ref 4.0–5.6)

## 2023-03-07 NOTE — Assessment & Plan Note (Signed)
Acute, no clear sign of urinary tract infection.  Possible bladder irritation.  Reviewed bladder irritants, encouraged her to push water.  She will follow-up if her symptoms progress.

## 2023-03-07 NOTE — Assessment & Plan Note (Signed)
Stable, chronic.  Continue current medication.   Well-controlled on losartan hydrochlorothiazide 100/25 mg p.o. daily.   

## 2023-03-07 NOTE — Assessment & Plan Note (Addendum)
Chronic, associated with retinopathy, good control, but unable to find 1.5 mg weekly... now on 0.75 mg. Working well to control diabetes, but no decrease in appetite.  If 1.5 is available she will change to this. After blood sugars went up after knee surgery we added jardiance 10 mg daily. Encouraged exercise, weight loss, healthy eating habits.   Trulicity 0.75 mg weekly.  Jardiance 10 mg daily.

## 2023-03-07 NOTE — Assessment & Plan Note (Signed)
Associated with diabetes. Yearly ophthalmology exam completed. 

## 2023-03-07 NOTE — Progress Notes (Signed)
He is also   Patient ID: Katie [name], female    DOB: [birth year], 77 y.o.   MRN: [id number]  This visit was conducted in person.  BP 120/60 (BP Location: Right Arm, Patient Position: Sitting, Cuff Size: Large)   Pulse 80   Temp 97.8 F (36.6 C) (Temporal)   Ht 5\' 2"  (1.575 m)   Wt 161 lb 8 oz (73.3 kg)   LMP 08/26/1992 (Approximate)   SpO2 96%   BMI 29.54 kg/m    CC:  Chief Complaint  Patient presents with   Diabetes   Burning with Urination    Subjective:   HPI: Katie [name] is a 77 y.o. female presenting on 03/07/2023 for Diabetes and Burning with Urination  New onset burning/tingling with urination in the last several weeks.  No change in frequency or urgency. No fever, no flank pain.  No vaginal discharge.  On anastrazole from breast cancer. Making weight loss difficult.  Diabetes: Continued excellent control of diabetes   on Trulicity  1.5 mg weekly... but she has been unable to find this... she has instead been taking for the majority of time  0.75 mg weekly  Jardiance 10 mg daily  She did have some elevations of glucose after knee surgery 2 moths ago. Lab Results  Component Value Date   HGBA1C 6.4 (A) 03/07/2023  Using medications without difficulties: Notes more bloating on the 3 mg compared to the 1.5 mg... also does not note much weight loss change Hypoglycemic episodes: none Hyperglycemic episodes: none Feet problems: no ulcers Blood Sugars averaging: FBS 120 eye exam within last year: yes  Wt Readings from Last 3 Encounters:  03/07/23 161 lb 8 oz (73.3 kg)  01/28/23 162 lb 12.8 oz (73.8 kg)  12/31/22 169 lb 8.5 oz (76.9 kg)     Wt Readings from Last 3 Encounters:  03/07/23 161 lb 8 oz (73.3 kg)  01/28/23 162 lb 12.8 oz (73.8 kg)  12/31/22 169 lb 8.5 oz (76.9 kg)  Body mass index is 29.54 kg/m.  Exercise: 3-5 days a week in gym  Hypertension:  Well controlled on losartan HCTZ. BP Readings from Last 3 Encounters:  03/07/23 120/60   01/28/23 (!) 128/58  12/31/22 133/61  Using medication without problems or lightheadedness:  none Chest pain with exertion:none Edema:none Short of breath:none Average home BPs: Other issues   Relevant past medical, surgical, family and social history reviewed and updated as indicated. Interim medical history since our last visit reviewed. Allergies and medications reviewed and updated. Outpatient Medications Prior to Visit  Medication Sig Dispense Refill   alendronate (FOSAMAX) 70 MG tablet TAKE 1 TABLET(70 MG) BY MOUTH 1 TIME A WEEK WITH A FULL GLASS OF WATER AND ON AN EMPTY STOMACH 12 tablet 3   anastrozole (ARIMIDEX) 1 MG tablet TAKE 1 TABLET(1 MG) BY MOUTH DAILY 90 tablet 3   atorvastatin (LIPITOR) 20 MG tablet Take 1 tablet (20 mg total) by mouth daily. 90 tablet 2   Calcium Carbonate-Vit D-Min (CALTRATE 600+D PLUS MINERALS) 600-800 MG-UNIT TABS Take 1 tablet by mouth in the morning and at bedtime.     clobetasol (TEMOVATE) 0.05 % external solution Apply 1 application topically 2 (two) times daily as needed (scalp). Reported on 08/16/2015     empagliflozin (JARDIANCE) 10 MG TABS tablet Take 1 tablet (10 mg total) by mouth daily before breakfast. 30 tablet 11   fenofibrate (TRICOR) 145 MG tablet Take 1 tablet (145 mg total) by mouth daily. 90 tablet  2   fluticasone (FLONASE) 50 MCG/ACT nasal spray SHAKE LIQUID AND USE 2 SPRAYS IN EACH NOSTRIL EVERY DAY 16 g 11   gabapentin (NEURONTIN) 300 MG capsule TAKE 1 CAPSULE(300 MG) BY MOUTH AT BEDTIME 90 capsule 3   glucose blood (FREESTYLE TEST STRIPS) test strip USE TO CHECK BLOOD SUGAR DAILY 50 strip 11   loratadine (CLARITIN) 10 MG tablet Take 10 mg by mouth daily.     losartan-hydrochlorothiazide (HYZAAR) 100-25 MG tablet TAKE 1 TABLET BY MOUTH DAILY 90 tablet 1   metroNIDAZOLE (METROCREAM) 0.75 % cream Apply 1 Application topically 2 (two) times daily as needed.     Multiple Vitamins-Minerals (CENTRUM SILVER 50+WOMEN) TABS Take 1 tablet  by mouth daily.     Polyethyl Glycol-Propyl Glycol (SYSTANE OP) Apply to eye as needed.     Probiotic Product (PROBIOTIC DAILY PO) Take by mouth. Schiff Digestive Advantage     psyllium (METAMUCIL) 58.6 % powder Take 1 packet by mouth daily.     PULMICORT FLEXHALER 180 MCG/ACT inhaler INHALE 2 PUFFS INTO THE LUNGS TWICE DAILY 1 each 11   TRULICITY 0.75 MG/0.5ML SOPN Inject 0.75 mg as directed once a week. 2 mL 5   valACYclovir (VALTREX) 500 MG tablet Take 1 tablet (500 mg total) by mouth 2 (two) times daily.     apixaban (ELIQUIS) 2.5 MG TABS tablet Take 1 tablet (2.5 mg total) by mouth 2 (two) times daily. (Patient not taking: Reported on 01/28/2023) 30 tablet 0   Na Sulfate-K Sulfate-Mg Sulf 17.5-3.13-1.6 GM/177ML SOLN Use as directed; may use generic; goodrx card if insurance will not cover generic 354 mL 0   No facility-administered medications prior to visit.     Per HPI unless specifically indicated in ROS section below Review of Systems  Constitutional:  Negative for fatigue and fever.  HENT:  Negative for congestion.   Eyes:  Negative for pain.  Respiratory:  Negative for cough and shortness of breath.   Cardiovascular:  Negative for chest pain, palpitations and leg swelling.  Gastrointestinal:  Negative for abdominal pain.  Genitourinary:  Negative for dysuria and vaginal bleeding.  Musculoskeletal:  Negative for back pain.  Neurological:  Negative for syncope, light-headedness and headaches.  Psychiatric/Behavioral:  Negative for dysphoric mood.    Objective:  BP 120/60 (BP Location: Right Arm, Patient Position: Sitting, Cuff Size: Large)   Pulse 80   Temp 97.8 F (36.6 C) (Temporal)   Ht 5\' 2"  (1.575 m)   Wt 161 lb 8 oz (73.3 kg)   LMP 08/26/1992 (Approximate)   SpO2 96%   BMI 29.54 kg/m   Wt Readings from Last 3 Encounters:  03/07/23 161 lb 8 oz (73.3 kg)  01/28/23 162 lb 12.8 oz (73.8 kg)  12/31/22 169 lb 8.5 oz (76.9 kg)      Physical Exam Constitutional:       General: She is not in acute distress.    Appearance: Normal appearance. She is well-developed. She is not ill-appearing or toxic-appearing.  HENT:     Head: Normocephalic.     Right Ear: Hearing, tympanic membrane, ear canal and external ear normal. Tympanic membrane is not erythematous, retracted or bulging.     Left Ear: Hearing, tympanic membrane, ear canal and external ear normal. Tympanic membrane is not erythematous, retracted or bulging.     Nose: No mucosal edema or rhinorrhea.     Right Sinus: No maxillary sinus tenderness or frontal sinus tenderness.     Left Sinus: No maxillary  sinus tenderness or frontal sinus tenderness.     Mouth/Throat:     Pharynx: Uvula midline.  Eyes:     General: Lids are normal. Lids are everted, no foreign bodies appreciated.     Conjunctiva/sclera: Conjunctivae normal.     Pupils: Pupils are equal, round, and reactive to light.  Neck:     Thyroid: No thyroid mass or thyromegaly.     Vascular: No carotid bruit.     Trachea: Trachea normal.  Cardiovascular:     Rate and Rhythm: Normal rate and regular rhythm.     Pulses: Normal pulses.     Heart sounds: Normal heart sounds, S1 normal and S2 normal. No murmur heard.    No friction rub. No gallop.  Pulmonary:     Effort: Pulmonary effort is normal. No tachypnea or respiratory distress.     Breath sounds: Normal breath sounds. No decreased breath sounds, wheezing, rhonchi or rales.  Abdominal:     General: Bowel sounds are normal.     Palpations: Abdomen is soft.     Tenderness: There is no abdominal tenderness.  Musculoskeletal:     Cervical back: Normal range of motion and neck supple.  Skin:    General: Skin is warm and dry.     Findings: No rash.  Neurological:     Mental Status: She is alert.  Psychiatric:        Mood and Affect: Mood is not anxious or depressed.        Speech: Speech normal.        Behavior: Behavior normal. Behavior is cooperative.        Thought Content: Thought  content normal.        Judgment: Judgment normal.       Results for orders placed or performed in visit on 03/07/23  POCT glycosylated hemoglobin (Hb A1C)  Result Value Ref Range   Hemoglobin A1C 6.4 (A) 4.0 - 5.6 %   HbA1c POC (<> result, manual entry)     HbA1c, POC (prediabetic range)     HbA1c, POC (controlled diabetic range)    POCT Urinalysis Dipstick (Automated)  Result Value Ref Range   Color, UA Yellow    Clarity, UA Clear    Glucose, UA Positive (A) Negative   Bilirubin, UA Negative    Ketones, UA Negative    Spec Grav, UA 1.015 1.010 - 1.025   Blood, UA Negative    pH, UA 6.0 5.0 - 8.0   Protein, UA Negative Negative   Urobilinogen, UA 0.2 0.2 or 1.0 E.U./dL   Nitrite, UA Negative    Leukocytes, UA Negative Negative     COVID 19 screen:  No recent travel or known exposure to COVID19 The patient denies respiratory symptoms of COVID 19 at this time. The importance of social distancing was discussed today.   Assessment and Plan Problem List Items Addressed This Visit     Burning with urination    Acute, no clear sign of urinary tract infection.  Possible bladder irritation.  Reviewed bladder irritants, encouraged her to push water.  She will follow-up if her symptoms progress.      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 now on 0.75 mg. Working well to control diabetes, but no decrease in appetite.  If 1.5 is available she  will change to this. After blood sugars went up after knee surgery we added jardiance 10 mg daily. Encouraged exercise, weight loss, healthy eating habits.   Trulicity 0.75 mg weekly.  Jardiance 10 mg daily.      Relevant Orders   POCT glycosylated hemoglobin (Hb A1C) (Completed)   Diabetic retinopathy (HCC)    Associated with diabetes. Yearly  ophthalmology exam completed.      Hypertension associated with diabetes (HCC) (Chronic)    Stable, chronic.  Continue current medication.   Well-controlled on losartan hydrochlorothiazide 100/25 mg p.o. daily.            Kerby Nora, MD

## 2023-03-11 DIAGNOSIS — M1612 Unilateral primary osteoarthritis, left hip: Secondary | ICD-10-CM | POA: Diagnosis not present

## 2023-03-11 DIAGNOSIS — M7062 Trochanteric bursitis, left hip: Secondary | ICD-10-CM | POA: Diagnosis not present

## 2023-03-12 DIAGNOSIS — M1612 Unilateral primary osteoarthritis, left hip: Secondary | ICD-10-CM | POA: Diagnosis not present

## 2023-03-12 DIAGNOSIS — M7062 Trochanteric bursitis, left hip: Secondary | ICD-10-CM | POA: Diagnosis not present

## 2023-03-16 ENCOUNTER — Emergency Department
Admission: EM | Admit: 2023-03-16 | Discharge: 2023-03-16 | Disposition: A | Discharge status: Home / Self Care | Payer: Medicare Other | Source: Home / Self Care | Attending: Emergency Medicine | Admitting: Emergency Medicine

## 2023-03-16 ENCOUNTER — Emergency Department: Payer: Medicare Other

## 2023-03-16 ENCOUNTER — Other Ambulatory Visit: Payer: Self-pay

## 2023-03-16 ENCOUNTER — Ambulatory Visit
Admission: EM | Admit: 2023-03-16 | Discharge: 2023-03-16 | Disposition: A | Discharge status: Home / Self Care | Payer: Medicare Other | Attending: Emergency Medicine | Admitting: Emergency Medicine

## 2023-03-16 DIAGNOSIS — E119 Type 2 diabetes mellitus without complications: Secondary | ICD-10-CM | POA: Insufficient documentation

## 2023-03-16 DIAGNOSIS — Z853 Personal history of malignant neoplasm of breast: Secondary | ICD-10-CM | POA: Diagnosis not present

## 2023-03-16 DIAGNOSIS — K5792 Diverticulitis of intestine, part unspecified, without perforation or abscess without bleeding: Secondary | ICD-10-CM

## 2023-03-16 DIAGNOSIS — K5790 Diverticulosis of intestine, part unspecified, without perforation or abscess without bleeding: Secondary | ICD-10-CM | POA: Insufficient documentation

## 2023-03-16 DIAGNOSIS — R1032 Left lower quadrant pain: Secondary | ICD-10-CM | POA: Diagnosis not present

## 2023-03-16 DIAGNOSIS — R935 Abnormal findings on diagnostic imaging of other abdominal regions, including retroperitoneum: Secondary | ICD-10-CM | POA: Diagnosis not present

## 2023-03-16 DIAGNOSIS — K5732 Diverticulitis of large intestine without perforation or abscess without bleeding: Secondary | ICD-10-CM | POA: Diagnosis not present

## 2023-03-16 DIAGNOSIS — K769 Liver disease, unspecified: Secondary | ICD-10-CM | POA: Diagnosis not present

## 2023-03-16 LAB — URINALYSIS, ROUTINE W REFLEX MICROSCOPIC
Bacteria, UA: NONE SEEN
Bilirubin Urine: NEGATIVE
Glucose, UA: >=500 mg/dL — AB
Hgb urine dipstick: NEGATIVE
Ketones, ur: NEGATIVE mg/dL
Leukocytes,Ua: NEGATIVE
Nitrite: NEGATIVE
Protein, ur: NEGATIVE mg/dL
Specific Gravity, Urine: 1.027 (ref 1.005–1.030)
pH: 7 (ref 5.0–8.0)

## 2023-03-16 LAB — COMPREHENSIVE METABOLIC PANEL
ALT: 23 U/L (ref 0–44)
AST: 20 U/L (ref 15–41)
Albumin: 4.5 g/dL (ref 3.5–5.0)
Alkaline Phosphatase: 40 U/L (ref 38–126)
Anion gap: 10 (ref 5–15)
BUN: 29 mg/dL — ABNORMAL HIGH (ref 8–23)
CO2: 23 mmol/L (ref 22–32)
Calcium: 10.2 mg/dL (ref 8.9–10.3)
Chloride: 104 mmol/L (ref 98–111)
Creatinine, Ser: 1.13 mg/dL — ABNORMAL HIGH (ref 0.44–1.00)
GFR, Estimated: 50 mL/min — ABNORMAL LOW (ref >60)
Glucose, Bld: 147 mg/dL — ABNORMAL HIGH (ref 70–99)
Potassium: 3.9 mmol/L (ref 3.5–5.1)
Sodium: 137 mmol/L (ref 135–145)
Total Bilirubin: 0.7 mg/dL (ref 0.3–1.2)
Total Protein: 8 g/dL (ref 6.5–8.1)

## 2023-03-16 LAB — POCT URINALYSIS DIP (MANUAL ENTRY)
Bilirubin, UA: NEGATIVE
Blood, UA: NEGATIVE
Glucose, UA: >=1000 mg/dL — AB
Ketones, POC UA: NEGATIVE mg/dL
Leukocytes, UA: NEGATIVE
Nitrite, UA: NEGATIVE
Protein Ur, POC: NEGATIVE mg/dL
Spec Grav, UA: 1.015 (ref 1.010–1.025)
Urobilinogen, UA: 0.2 E.U./dL
pH, UA: 6 (ref 5.0–8.0)

## 2023-03-16 LAB — CBC
HCT: 38.2 % (ref 36.0–46.0)
Hemoglobin: 13 g/dL (ref 12.0–15.0)
MCH: 33.7 pg (ref 26.0–34.0)
MCHC: 34 g/dL (ref 30.0–36.0)
MCV: 99 fL (ref 80.0–100.0)
Platelets: 290 10*3/uL (ref 150–400)
RBC: 3.86 MIL/uL — ABNORMAL LOW (ref 3.87–5.11)
RDW: 13.8 % (ref 11.5–15.5)
WBC: 7.7 10*3/uL (ref 4.0–10.5)
nRBC: 0 % (ref 0.0–0.2)

## 2023-03-16 LAB — LIPASE, BLOOD: Lipase: 32 U/L (ref 11–51)

## 2023-03-16 LAB — POCT FASTING CBG KUC MANUAL ENTRY: POCT Glucose (KUC): 181 mg/dL — AB (ref 70–99)

## 2023-03-16 MED ORDER — IOHEXOL 300 MG/ML  SOLN
75.0000 mL | Freq: Once | INTRAMUSCULAR | Status: AC | PRN
  Administered 2023-03-16: 75 mL via INTRAVENOUS

## 2023-03-16 MED ORDER — AMOXICILLIN-POT CLAVULANATE 875-125 MG PO TABS: 1.0000 | ORAL_TABLET | Freq: Two times a day (BID) | ORAL | 0 refills | Status: AC

## 2023-03-16 NOTE — ED Triage Notes (Signed)
Pt arrives via POV. Pt C/O lower abdominal pain and some discomfort with urination since yesterday. Pt denies n/v/d. Pt AxOx4.

## 2023-03-16 NOTE — ED Provider Notes (Signed)
Katie [name]    CSN: [id number] Arrival date & time: 03/16/23  1008      History   Chief Complaint Chief Complaint  Patient presents with   Abdominal Pain    HPI Katie [name] is a 77 y.o. female.   77 year old female, Katie [name], presents to urgent care, for evaluation of lower left abdominal pain,worse with sitting or laying,pain w urination, describes as sharp pain 5-6/10, not relieved by tylenol.  PMH of diverticultiis, diverticulosis, diabetes, HTN,anemia,CAD  The history is provided by the patient. No language interpreter was used.    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, small, not treating-watching , it is now stented   Blood transfusion without reported diagnosis    Cancer (HCC)    breast 08/2018   Coronary artery disease    Diverticulitis    DJD (degenerative joint disease)    DM type 2 (diabetes mellitus, type 2) (HCC)    Family history of breast cancer    Fibrocystic breast disease    GERD (gastroesophageal reflux disease)    History of chicken pox    Hypercholesteremia    Hypertension    Lichen planopilaris    Neuromuscular disorder (HCC)    Sleep apnea    cpap   Urinary incontinence    Vertigo 12/2019   1 episode    Patient Active Problem List   Diagnosis Date Noted   Abdominal pain, left lower quadrant 03/16/2023   Burning with urination 03/07/2023   Overweight (BMI 25.0-29.9) 03/05/2022   Elevated ferritin 03/23/2021   RUQ pain 02/23/2021   Decreased GFR 02/23/2021   Vitamin B 12 deficiency 02/23/2021   Leukopenia 02/23/2021   Bronchiectasis without complication (HCC) 01/26/2020   Aortic atherosclerosis (HCC) 12/23/2019   Atherosclerosis of coronary artery of native heart without angina pectoris 12/23/2019   Peripheral edema 12/22/2019   Chemotherapy-induced peripheral neuropathy (HCC) 10/29/2019   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   Anemia 10/15/2018    Port-A-Cath in place 09/29/2018   Diabetic retinopathy (HCC) 09/29/2018   Genetic testing 09/23/2018   Family history of leukemia 09/16/2018   Family history of breast cancer    Malignant neoplasm of upper-outer quadrant of left breast in female, 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/11/2018   Family history of breast cancer in sister 08/28/2018   Diverticular disease 10/17/2017   Primary osteoarthritis of right knee 03/07/2017   HSV-1 (herpes simplex virus 1) infection, 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   Vitamin D deficiency 10/11/2016   Skin rash 10/11/2016   Family history of thyroid disorder 04/04/2015   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   Counseling regarding end of life decision making 10/04/2014   OA (osteoarthritis) of neck 08/02/2014   History of duodenal ulcer 08/02/2014   Seasonal allergies 08/02/2014   Hypertension associated with diabetes (HCC) 08/02/2014   Hyperlipidemia associated with type 2 diabetes mellitus (HCC) 08/02/2014   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   History of joint replacement 03/01/2014   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) 11/07/2009    Past Surgical History:  Procedure Laterality Date   BREAST IMPLANT REMOVAL Bilateral 03/08/2019   Procedure: Removal of Bilateral Breast Implants;  Surgeon: Emelia Loron, MD;  Location: [facility] OR;  Service: General;  Laterality: Bilateral;   BREAST SURGERY  02/1975   Left Breast Biopsy-Fibrocystic Disease   BREAST SURGERY  10/1981   Bialteral Capsulectomy Breast Surgery   BREAST  SURGERY  07/1998   Replace Bilateral Silicone Implants   BREAST SURGERY  01/1976   Bilater breast surgery to remove fibrocystic tissue and replace with silicone implants   CATARACT EXTRACTION W/PHACO Right 02/08/2020   Procedure: CATARACT EXTRACTION PHACO AND INTRAOCULAR LENS PLACEMENT (IOC) RIGHT DIABETIC;  Surgeon: Galen Manila, MD;  Location: [facility];  Service: Ophthalmology;  Laterality: Right;  3.55 0:33.7   CATARACT  EXTRACTION W/PHACO Left 02/29/2020   Procedure: CATARACT EXTRACTION PHACO AND INTRAOCULAR LENS PLACEMENT (IOC) LEFT DIABETIC 2.93  00:27.1;  Surgeon: Galen Manila, MD;  Location: [facility];  Service: Ophthalmology;  Laterality: Left;  Diabetic - oral meds   CHOLECYSTECTOMY N/A 07/08/2016   Procedure: LAPAROSCOPIC CHOLECYSTECTOMY;  Surgeon: Ricarda Frame, MD;  Location: [facility] ORS;  Service: General;  Laterality: N/A;   COLONOSCOPY WITH PROPOFOL N/A 11/07/2017   Procedure: COLONOSCOPY WITH PROPOFOL;  Surgeon: Wyline Mood, MD;  Location: [facility] END[facility]COPY;  Service: Gastroenterology;  Laterality: N/A;   DILATION AND CURETTAGE OF UTERUS  01/1994   ESOPHAGOGASTRODUODENOSCOPY (EGD) WITH PROPOFOL N/A 12/05/2016   Procedure: ESOPHAGOGASTRODUODENOSCOPY (EGD) WITH PROPOFOL;  Surgeon: Wyline Mood, MD;  Location: [facility] ENDOSCOPY;  Service: Endoscopy;  Laterality: N/A;   FINGER SURGERY  2001 & 2003   Trigger Finger   FOOT SURGERY  1980   To Relieve Pinched Nerve   FOOT SURGERY  07/2008   Left Foot-Plantar Fasciitis   JOINT REPLACEMENT  04/19/2013   Hip Replacement-Right   MASTECTOMY     double   MASTECTOMY W/ SENTINEL NODE BIOPSY Bilateral 03/08/2019   Procedure: BILATERAL MASTECTOMIES WITH LEFT AXILLARY SENTINEL LYMPH NODE BIOPSY AND BLUE DYE INJECTION;  Surgeon: Emelia Loron, MD;  Location: [facility] OR;  Service: General;  Laterality: Bilateral;   PARTIAL KNEE ARTHROPLASTY Right 12/31/2022   Procedure: RIGHT PARTIAL KNEE ARTHROPLASTY;  Surgeon: Christena Flake, MD;  Location: [facility] ORS;  Service: Orthopedics;  Laterality: Right;   PLACEMENT OF BREAST IMPLANTS  12/26/2004   Replace Failed Implants   REPLACEMENT TOTAL KNEE Left    2019-   RHINOPLASTY  03/1964   To Correct Deviated Septum    OB History   No obstetric history on file.      Home Medications    Prior to Admission medications   Medication Sig Start Date End Date Taking? Authorizing Provider  alendronate (FOSAMAX) 70 MG  tablet TAKE 1 TABLET(70 MG) BY MOUTH 1 TIME A WEEK WITH A FULL GLASS OF WATER AND ON AN EMPTY STOMACH 07/31/22  Yes Serena Croissant, MD  anastrozole (ARIMIDEX) 1 MG tablet TAKE 1 TABLET(1 MG) BY MOUTH DAILY 03/03/23  Yes Serena Croissant, MD  atorvastatin (LIPITOR) 20 MG tablet Take 1 tablet (20 mg total) by mouth daily. 11/15/22  Yes Agbor-Etang, Arlys John, MD  Calcium Carbonate-Vit D-Min (CALTRATE 600+D PLUS MINERALS) 600-800 MG-UNIT TABS Take 1 tablet by mouth in the morning and at bedtime.   Yes [provider]  clobetasol (TEMOVATE) 0.05 % external solution Apply 1 application topically 2 (two) times daily as needed (scalp). Reported on 08/16/2015 06/02/14  Yes [provider]  empagliflozin (JARDIANCE) 10 MG TABS tablet Take 1 tablet (10 mg total) by mouth daily before breakfast. 01/07/23  Yes Bedsole, Amy E, MD  fenofibrate (TRICOR) 145 MG tablet Take 1 tablet (145 mg total) by mouth daily. 11/15/22  Yes Agbor-Etang, Arlys John, MD  fluticasone (FLONASE) 50 MCG/ACT nasal spray SHAKE LIQUID AND USE 2 SPRAYS IN EACH NOSTRIL EVERY DAY 04/01/22  Yes [name], MD  gabapentin (NEURONTIN) 300 MG capsule TAKE 1 CAPSULE(300 MG) BY MOUTH AT BEDTIME 07/01/22  Yes Serena Croissant, MD  glucose blood (FREESTYLE TEST STRIPS) test strip USE TO CHECK BLOOD SUGAR DAILY 04/08/21  Yes Bedsole, Amy E, MD  loratadine (CLARITIN) 10 MG tablet Take 10 mg by mouth daily.   Yes [provider]  losartan-hydrochlorothiazide (HYZAAR) 100-25 MG tablet TAKE 1 TABLET BY MOUTH DAILY 12/05/22  Yes Bedsole, Amy E, MD  metroNIDAZOLE (METROCREAM) 0.75 % cream Apply 1 Application topically 2 (two) times daily as needed. 01/03/23  Yes [provider]  Multiple Vitamins-Minerals (CENTRUM SILVER 50+WOMEN) TABS Take 1 tablet by mouth daily.   Yes [provider]  Polyethyl Glycol-Propyl Glycol (SYSTANE OP) Apply to eye as needed.   Yes [provider]  Probiotic Product (PROBIOTIC DAILY PO) Take by mouth.  Schiff Digestive Advantage   Yes [provider]  psyllium (METAMUCIL) 58.6 % powder Take 1 packet by mouth daily.   Yes [provider]  PULMICORT FLEXHALER 180 MCG/ACT inhaler INHALE 2 PUFFS INTO THE LUNGS TWICE DAILY 12/16/22  Yes Salena Saner, MD  TRULICITY 0.75 MG/0.5ML SOPN Inject 0.75 mg as directed once a week. 12/06/22  Yes Bedsole, Amy E, MD  valACYclovir (VALTREX) 500 MG tablet Take 1 tablet (500 mg total) by mouth 2 (two) times daily. 12/31/22  Yes Poggi, [name], MD  prochlorperazine (COMPAZINE) 10 MG tablet Take 1 tablet (10 mg total) by mouth every 6 (six) hours as needed (Nausea or vomiting). 09/16/18 01/20/19  Serena Croissant, MD    Family History Family History  Problem Relation Age of Onset   Arthritis Mother    Hypertension Mother    Heart disease Father    Diabetes Father    Heart attack Father    Breast cancer Sister 6       d. 13   Hypertension Brother    Leukemia Brother    Coronary artery disease Brother    Heart attack Brother     Social History Social History   Tobacco Use   Smoking status: Never   Smokeless tobacco: Never  Vaping Use   Vaping status: Never Used  Substance Use Topics   Alcohol use: Not Currently    Alcohol/week: 0.0 standard drinks of alcohol    Comment: rarely   Drug use: No     Allergies   Erythromycin, Ciprofloxacin, Daypro [oxaprozin], Enalapril maleate, Nsaids, and Vioxx [rofecoxib]   Review of Systems Review of Systems  Constitutional:  Negative for fever.  Gastrointestinal:  Positive for abdominal pain.  Genitourinary:  Positive for dysuria.  All other systems reviewed and are negative.    Physical Exam Triage Vital Signs ED Triage Vitals  Encounter Vitals Group     BP      Systolic BP Percentile      Diastolic BP Percentile      Pulse      Resp      Temp      Temp src      SpO2      Weight      Height      Head Circumference      Peak Flow      Pain Score      Pain Loc      Pain  Education      Exclude from Growth Chart    No data found.  Updated Vital Signs BP 119/70 (BP Location: Left Arm)   Pulse 73  Temp 98.6 F (37 C) (Oral)   Ht 5\' 2"  (1.575 m)   Wt 164 lb (74.4 kg)   LMP 08/26/1992 (Approximate)   SpO2 96%   BMI 30.00 kg/m   Visual Acuity Right Eye Distance:   Left Eye Distance:   Bilateral Distance:    Right Eye Near:   Left Eye Near:    Bilateral Near:     Physical Exam Vitals and nursing note reviewed.  Abdominal:     General: Bowel sounds are normal.     Tenderness: There is abdominal tenderness in the left lower quadrant. There is no guarding or rebound.  Neurological:     General: No focal deficit present.     Mental Status: She is alert and oriented to person, place, and time.     GCS: GCS eye subscore is 4. GCS verbal subscore is 5. GCS motor subscore is 6.  Psychiatric:        Attention and Perception: Attention normal.        Mood and Affect: Mood normal.        Speech: Speech normal.      UC Treatments / Results  Labs (all labs ordered are listed, but only abnormal results are displayed) Labs Reviewed  POCT URINALYSIS DIP (MANUAL ENTRY) - Abnormal; Notable for the following components:      Result Value   Glucose, UA >=1,000 (*)    All other components within normal limits  POCT FASTING CBG [facility] MANUAL ENTRY - Abnormal; Notable for the following components:   POCT Glucose ([facility]) 181 (*)    All other components within normal limits    EKG   Radiology No results found.  Procedures Procedures (including critical care time)  Medications Ordered in UC Medications - No data to display  Initial Impression / Assessment and Plan / UC Course  I have reviewed the triage vital signs and the nursing notes.  Pertinent labs & imaging results that were available during my care of the patient were reviewed by me and considered in my medical decision making (see chart for details).     Ddx: LLQ abd pain,  diverticulitis Final Clinical Impressions(s) / UC Diagnoses   Final diagnoses:  Abdominal pain, left lower quadrant     Discharge Instructions      Please go to the ED for further evaluation of sharp abdominal pain LLQ. Do not eat or drink anything until seen by provider.   Your BS was 181     ED Prescriptions   None    PDMP not reviewed this encounter.   Clancy Gourd, NP 03/16/23 1136

## 2023-03-16 NOTE — ED Provider Notes (Signed)
[facility] Provider Note    Event Date/Time   First MD Initiated Contact with Patient 03/16/23 1153     (approximate)   History   Abdominal Pain   HPI  Katie [name] is a 77 y.o. female with a past medical history of diverticulitis.  He presents today for evaluation of left lower quadrant pain that began yesterday.  Patient reports that she went to a urgent care and they sent her to the emergency department for possible diverticulitis.  She had a urine sample during that she reports was negative for urinary tract infection.  Patient reports that her pain is a dull ache in her suprapubic and left lower quadrant area.  She has not had any nausea, vomiting, diarrhea, or bloody stool.  No fevers or chills.  Patient Active Problem List   Diagnosis Date Noted   Abdominal pain, left lower quadrant 03/16/2023   Burning with urination 03/07/2023   Overweight (BMI 25.0-29.9) 03/05/2022   Elevated ferritin 03/23/2021   RUQ pain 02/23/2021   Decreased GFR 02/23/2021   Vitamin B 12 deficiency 02/23/2021   Leukopenia 02/23/2021   Bronchiectasis without complication (HCC) 01/26/2020   Aortic atherosclerosis (HCC) 12/23/2019   Atherosclerosis of coronary artery of native heart without angina pectoris 12/23/2019   Peripheral edema 12/22/2019   Chemotherapy-induced peripheral neuropathy (HCC) 10/29/2019   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   Anemia 10/15/2018   Port-A-Cath in place 09/29/2018   Diabetic retinopathy (HCC) 09/29/2018   Genetic testing 09/23/2018   Family history of leukemia 09/16/2018   Family history of breast cancer    Malignant neoplasm of upper-outer quadrant of left breast in female, 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/11/2018   Family history of breast cancer in sister 08/28/2018   Diverticular disease 10/17/2017   Primary osteoarthritis of right knee 03/07/2017   HSV-1 (herpes simplex virus 1) infection, 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   Vitamin D  deficiency 10/11/2016   Skin rash 10/11/2016   Family history of thyroid disorder 04/04/2015   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   Counseling regarding end of life decision making 10/04/2014   OA (osteoarthritis) of neck 08/02/2014   History of duodenal ulcer 08/02/2014   Seasonal allergies 08/02/2014   Hypertension associated with diabetes (HCC) 08/02/2014   Hyperlipidemia associated with type 2 diabetes mellitus (HCC) 08/02/2014   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   History of joint replacement 03/01/2014   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) 11/07/2009          Physical Exam   Triage Vital Signs: ED Triage Vitals [03/16/23 1136]  Encounter Vitals Group     BP (!) 149/70     Systolic BP Percentile      Diastolic BP Percentile      Pulse Rate 72     Resp 16     Temp 97.8 F (36.6 C)     Temp src      SpO2 95 %     Weight 164 lb (74.4 kg)     Height 5\' 2"  (1.575 m)     Head Circumference      Peak Flow      Pain Score 5     Pain Loc      Pain Education      Exclude from Growth Chart     Most recent vital signs: Vitals:   03/16/23 1136 03/16/23 1442  BP: (!) 149/70 (!) 140/70  Pulse: 72 70  Resp: 16 16  Temp: 97.8 F (  36.6 C)   SpO2: 95% 96%    Physical Exam Vitals and nursing note reviewed.  Constitutional:      General: Awake and alert. No acute distress.    Appearance: Normal appearance. The patient is overweight.  HENT:     Head: Normocephalic and atraumatic.     Mouth: Mucous membranes are moist.  Eyes:     General: PERRL. Normal EOMs        Right eye: No discharge.        Left eye: No discharge.     Conjunctiva/sclera: Conjunctivae normal.  Cardiovascular:     Rate and Rhythm: Normal rate and regular rhythm.     Pulses: Normal pulses.     Heart sounds: Normal heart sounds Pulmonary:     Effort: Pulmonary effort is normal. No respiratory distress.     Breath sounds: Normal breath sounds.  Abdominal:     Abdomen is soft. There  is suprapubic and left lower quadrant abdominal tenderness. No rebound or guarding. No distention. Musculoskeletal:        General: No swelling. Normal range of motion.     Cervical back: Normal range of motion and neck supple.  Skin:    General: Skin is warm and dry.     Capillary Refill: Capillary refill takes less than 2 seconds.     Findings: No rash.  Neurological:     Mental Status: The patient is awake and alert.      ED Results / Procedures / Treatments   Labs (all labs ordered are listed, but only abnormal results are displayed) Labs Reviewed  COMPREHENSIVE METABOLIC PANEL - Abnormal; Notable for the following components:      Result Value   Glucose, Bld 147 (*)    BUN 29 (*)    Creatinine, Ser 1.13 (*)    GFR, Estimated 50 (*)    All other components within normal limits  CBC - Abnormal; Notable for the following components:   RBC 3.86 (*)    All other components within normal limits  URINALYSIS, ROUTINE W REFLEX MICROSCOPIC - Abnormal; Notable for the following components:   Color, Urine YELLOW (*)    APPearance CLEAR (*)    Glucose, UA >=500 (*)    All other components within normal limits  LIPASE, BLOOD     EKG     RADIOLOGY I independently reviewed and interpreted imaging and agree with radiologists findings.     PROCEDURES:  Critical Care performed:   Procedures   MEDICATIONS ORDERED IN ED: Medications  iohexol (OMNIPAQUE) 300 MG/ML solution 75 mL (75 mLs Intravenous Contrast Given 03/16/23 1319)     IMPRESSION / MDM / ASSESSMENT AND PLAN / ED COURSE  I reviewed the triage vital signs and the nursing notes.   Differential diagnosis includes, but is not limited to, diverticulitis, colitis, gastroenteritis, urinary tract infection, nephrolithiasis.  Patient is awake and alert, hemodynamically stable and afebrile.  She is nontoxic in appearance.  I reviewed the patient's chart.  Patient was seen in urgent care just prior to arrival to  the emergency department, she was sent to the ER for evaluation of diverticulitis.  Labs obtained are overall reassuring.  She declined analgesia or antiemetics.  CT scan obtained and demonstrates acute uncomplicated diverticulitis.  Patient was started on oral antibiotics for we discussed strict return precautions and the importance of close outpatient follow-up.  She was given the information for general surgery for follow-up as needed.  We discussed return precautions  in the meantime.  Patient understands and agrees with plan.  She was discharged in stable condition with her significant other.   Patient's presentation is most consistent with acute complicated illness / injury requiring diagnostic workup.      FINAL CLINICAL IMPRESSION(S) / ED DIAGNOSES   Final diagnoses:  Diverticulitis     Rx / DC Orders   ED Discharge Orders          Ordered    amoxicillin-clavulanate (AUGMENTIN) 875-125 MG tablet  2 times daily        03/16/23 1438             Note:  This document was prepared using Dragon voice recognition software and may include unintentional dictation errors.   Keturah Shavers 03/16/23 1452    Shaune Pollack, MD 03/16/23 1945

## 2023-03-16 NOTE — Discharge Instructions (Signed)
Please go to the ED for further evaluation of sharp abdominal pain LLQ. Do not eat or drink anything until seen by provider.   Your BS was 181

## 2023-03-16 NOTE — ED Triage Notes (Signed)
Pt c/o lower left abdominal pain that hurts when sitting or laying down.  Pt states that she has sharp pain when urinating and is unsure if she has a bladder infection.  Pt has a history of diverticulitis on the lower left side.

## 2023-03-16 NOTE — Discharge Instructions (Addendum)
Your CT scan shows diverticulitis.  Take antibiotic as prescribed.  Please follow-up with general surgery for recheck.  Please return for any new, worsening, or change in symptoms or other concerns.  It was a pleasure caring for you today.

## 2023-03-17 ENCOUNTER — Telehealth: Payer: Self-pay

## 2023-03-17 NOTE — Telephone Encounter (Signed)
Transition Care Management Unsuccessful Follow-up Telephone Call  Date of discharge and from where:  [place] 7/21  Attempts:  1st Attempt  Reason for unsuccessful TCM follow-up call:  No answer/busy   Lenard Forth [facility] Guide, [facility] [phone] [address] [place], [place], [place] [address] Phone: [phone] Email: [email or web address]

## 2023-03-18 ENCOUNTER — Telehealth: Payer: Self-pay

## 2023-03-18 NOTE — Telephone Encounter (Signed)
Transition Care Management Follow-up Telephone Call Date of discharge and from where: [place] How have you been since you were released from the hospital? Doing ok still taking medication Any questions or concerns? No  Items Reviewed: Did the pt receive and understand the discharge instructions provided? Yes  Medications obtained and verified? Yes  Other? No  Any new allergies since your discharge? No  Dietary orders reviewed? No Do you have support at home? Yes    Follow up appointments reviewed:  PCP Hospital f/u appt confirmed? No  Scheduled to see  on  @ . Specialist Hospital f/u appt confirmed?  NO Scheduled to see  on  @ . Are transportation arrangements needed? NO If their condition worsens, is the pt aware to call PCP or go to the Emergency Dept.? Yes Was the patient provided with contact information for the PCP's office or ED? Yes Was to pt encouraged to call back with questions or concerns? Yes

## 2023-03-21 ENCOUNTER — Encounter: Payer: Self-pay | Admitting: Family Medicine

## 2023-03-21 ENCOUNTER — Ambulatory Visit (INDEPENDENT_AMBULATORY_CARE_PROVIDER_SITE_OTHER): Payer: Medicare Other | Admitting: Family Medicine

## 2023-03-21 VITALS — BP 120/70 | HR 81 | Temp 97.9°F | Ht 62.0 in | Wt 159.5 lb

## 2023-03-21 DIAGNOSIS — E113293 Type 2 diabetes mellitus with mild nonproliferative diabetic retinopathy without macular edema, bilateral: Secondary | ICD-10-CM | POA: Diagnosis not present

## 2023-03-21 DIAGNOSIS — K5792 Diverticulitis of intestine, part unspecified, without perforation or abscess without bleeding: Secondary | ICD-10-CM | POA: Insufficient documentation

## 2023-03-21 NOTE — Assessment & Plan Note (Addendum)
Chronic, stable control despite ongoing infection.   Trulicity 0.75 mg weekly.  Jardiance 10 mg daily.

## 2023-03-21 NOTE — Progress Notes (Signed)
Patient ID: Katie [name], female    DOB: Sep 19, 1945, 77 y.o.   MRN: [id number]  This visit was conducted in person.  BP 120/70 (BP Location: Right Arm, Patient Position: Sitting, Cuff Size: Large)   Pulse 81   Temp 97.9 F (36.6 C) (Temporal)   Ht 5\' 2"  (1.575 m)   Wt 159 lb 8 oz (72.3 kg)   LMP 08/26/1992 (Approximate)   SpO2 96%   BMI 29.17 kg/m    CC:  Chief Complaint  Patient presents with   Hospitalization Follow-up    ER for Diverticulitis 03/16/23    Subjective:   HPI: Katie [name] is a 77 y.o. female presenting on 03/21/2023 for Hospitalization Follow-up (ER for Diverticulitis 03/16/23)  Reviewed ED note from March 16, 2023 Patient seen with left lower quadrant pain x 24 hours UA negative CT scan obtained and demonstrates acute uncomplicated diverticulitis. She was started on Augmentin..  She is now on day 6 of 10  Today she reports  she has noted  60% improvement in LLQ pain.  No fever. Tolerating antibiotics. No diarrhea, no blood in stool.  Still notes pressure before and after she urinates, now only when lying down at night.   She is eating soft bland foods, keeping up with water intake.  Review her heart murmurs take some deep breath tender when I tap they are only here nightly on back forming eventually to go to many layers   Wt Readings from Last 3 Encounters:  03/21/23 159 lb 8 oz (72.3 kg)  03/16/23 164 lb (74.4 kg)  03/16/23 164 lb (74.4 kg)   Last episode 2019     Relevant past medical, surgical, family and social history reviewed and updated as indicated. Interim medical history since our last visit reviewed. Allergies and medications reviewed and updated. Outpatient Medications Prior to Visit  Medication Sig Dispense Refill   alendronate (FOSAMAX) 70 MG tablet TAKE 1 TABLET(70 MG) BY MOUTH 1 TIME A WEEK WITH A FULL GLASS OF WATER AND ON AN EMPTY STOMACH 12 tablet 3   amoxicillin-clavulanate (AUGMENTIN) 875-125 MG tablet Take 1 tablet by  mouth 2 (two) times daily for 10 days. 20 tablet 0   anastrozole (ARIMIDEX) 1 MG tablet TAKE 1 TABLET(1 MG) BY MOUTH DAILY 90 tablet 3   atorvastatin (LIPITOR) 20 MG tablet Take 1 tablet (20 mg total) by mouth daily. 90 tablet 2   Calcium Carbonate-Vit D-Min (CALTRATE 600+D PLUS MINERALS) 600-800 MG-UNIT TABS Take 1 tablet by mouth in the morning and at bedtime.     clobetasol (TEMOVATE) 0.05 % external solution Apply 1 application topically 2 (two) times daily as needed (scalp). Reported on 08/16/2015     empagliflozin (JARDIANCE) 10 MG TABS tablet Take 1 tablet (10 mg total) by mouth daily before breakfast. 30 tablet 11   fenofibrate (TRICOR) 145 MG tablet Take 1 tablet (145 mg total) by mouth daily. 90 tablet 2   fluticasone (FLONASE) 50 MCG/ACT nasal spray SHAKE LIQUID AND USE 2 SPRAYS IN EACH NOSTRIL EVERY DAY 16 g 11   gabapentin (NEURONTIN) 300 MG capsule TAKE 1 CAPSULE(300 MG) BY MOUTH AT BEDTIME 90 capsule 3   glucose blood (FREESTYLE TEST STRIPS) test strip USE TO CHECK BLOOD SUGAR DAILY 50 strip 11   loratadine (CLARITIN) 10 MG tablet Take 10 mg by mouth daily.     losartan-hydrochlorothiazide (HYZAAR) 100-25 MG tablet TAKE 1 TABLET BY MOUTH DAILY 90 tablet 1   metroNIDAZOLE (METROCREAM) 0.75 %  cream Apply 1 Application topically 2 (two) times daily as needed.     Multiple Vitamins-Minerals (CENTRUM SILVER 50+WOMEN) TABS Take 1 tablet by mouth daily.     Polyethyl Glycol-Propyl Glycol (SYSTANE OP) Apply to eye as needed.     Probiotic Product (PROBIOTIC DAILY PO) Take by mouth. [employer] Digestive Advantage     psyllium (METAMUCIL) 58.6 % powder Take 1 packet by mouth daily.     PULMICORT FLEXHALER 180 MCG/ACT inhaler INHALE 2 PUFFS INTO THE LUNGS TWICE DAILY 1 each 11   TRULICITY 0.75 MG/0.5ML SOPN Inject 0.75 mg as directed once a week. 2 mL 5   valACYclovir (VALTREX) 500 MG tablet Take 1 tablet (500 mg total) by mouth 2 (two) times daily.     No facility-administered medications prior  to visit.     Per HPI unless specifically indicated in ROS section below Review of Systems  Constitutional:  Negative for fatigue and fever.  HENT:  Negative for congestion.   Eyes:  Negative for pain.  Respiratory:  Negative for cough and shortness of breath.   Cardiovascular:  Negative for chest pain, palpitations and leg swelling.  Gastrointestinal:  Positive for abdominal pain.  Genitourinary:  Negative for dysuria and vaginal bleeding.  Musculoskeletal:  Negative for back pain.  Neurological:  Negative for syncope, light-headedness and headaches.  Psychiatric/Behavioral:  Negative for dysphoric mood.    Objective:  BP 120/70 (BP Location: Right Arm, Patient Position: Sitting, Cuff Size: Large)   Pulse 81   Temp 97.9 F (36.6 C) (Temporal)   Ht 5\' 2"  (1.575 m)   Wt 159 lb 8 oz (72.3 kg)   LMP 08/26/1992 (Approximate)   SpO2 96%   BMI 29.17 kg/m   Wt Readings from Last 3 Encounters:  03/21/23 159 lb 8 oz (72.3 kg)  03/16/23 164 lb (74.4 kg)  03/16/23 164 lb (74.4 kg)      Physical Exam Constitutional:      General: She is not in acute distress.    Appearance: Normal appearance. She is well-developed. She is not ill-appearing or toxic-appearing.  HENT:     Head: Normocephalic.     Right Ear: Hearing, tympanic membrane, ear canal and external ear normal. Tympanic membrane is not erythematous, retracted or bulging.     Left Ear: Hearing, tympanic membrane, ear canal and external ear normal. Tympanic membrane is not erythematous, retracted or bulging.     Nose: No mucosal edema or rhinorrhea.     Right Sinus: No maxillary sinus tenderness or frontal sinus tenderness.     Left Sinus: No maxillary sinus tenderness or frontal sinus tenderness.     Mouth/Throat:     Mouth: Oropharynx is clear and moist and mucous membranes are normal.     Pharynx: Uvula midline.  Eyes:     General: Lids are normal. Lids are everted, no foreign bodies appreciated.     Extraocular Movements:  EOM normal.     Conjunctiva/sclera: Conjunctivae normal.     Pupils: Pupils are equal, round, and reactive to light.  Neck:     Thyroid: No thyroid mass or thyromegaly.     Vascular: No carotid bruit.     Trachea: Trachea normal.  Cardiovascular:     Rate and Rhythm: Normal rate and regular rhythm.     Pulses: Normal pulses.     Heart sounds: Normal heart sounds, S1 normal and S2 normal. No murmur heard.    No friction rub. No gallop.  Pulmonary:  Effort: Pulmonary effort is normal. No tachypnea or respiratory distress.     Breath sounds: Normal breath sounds. No decreased breath sounds, wheezing, rhonchi or rales.  Abdominal:     General: Bowel sounds are normal.     Palpations: Abdomen is soft.     Tenderness: There is abdominal tenderness in the left lower quadrant. There is no right CVA tenderness, left CVA tenderness, guarding or rebound.  Musculoskeletal:     Cervical back: Normal range of motion and neck supple.  Skin:    General: Skin is warm, dry and intact.     Findings: No rash.  Neurological:     Mental Status: She is alert.  Psychiatric:        Mood and Affect: Mood is not anxious or depressed.        Speech: Speech normal.        Behavior: Behavior normal. Behavior is cooperative.        Thought Content: Thought content normal.        Cognition and Memory: Cognition and memory normal.        Judgment: Judgment normal.       Results for orders placed or performed during the hospital encounter of 03/16/23  Lipase, blood  Result Value Ref Range   Lipase 32 11 - 51 U/L  Comprehensive metabolic panel  Result Value Ref Range   Sodium 137 135 - 145 mmol/L   Potassium 3.9 3.5 - 5.1 mmol/L   Chloride 104 98 - 111 mmol/L   CO2 23 22 - 32 mmol/L   Glucose, Bld 147 (H) 70 - 99 mg/dL   BUN 29 (H) 8 - 23 mg/dL   Creatinine, Ser 6.96 (H) 0.44 - 1.00 mg/dL   Calcium 29.5 8.9 - 28.4 mg/dL   Total Protein 8.0 6.5 - 8.1 g/dL   Albumin 4.5 3.5 - 5.0 g/dL   AST 20 15 -  41 U/L   ALT 23 0 - 44 U/L   Alkaline Phosphatase 40 38 - 126 U/L   Total Bilirubin 0.7 0.3 - 1.2 mg/dL   GFR, Estimated 50 (L) >60 mL/min   Anion gap 10 5 - 15  CBC  Result Value Ref Range   WBC 7.7 4.0 - 10.5 K/uL   RBC 3.86 (L) 3.87 - 5.11 MIL/uL   Hemoglobin 13.0 12.0 - 15.0 g/dL   HCT 13.2 44.0 - 10.2 %   MCV 99.0 80.0 - 100.0 fL   MCH 33.7 26.0 - 34.0 pg   MCHC 34.0 30.0 - 36.0 g/dL   RDW 72.5 36.6 - 44.0 %   Platelets 290 150 - 400 K/uL   nRBC 0.0 0.0 - 0.2 %  Urinalysis, Routine w reflex microscopic -Urine, Clean Catch  Result Value Ref Range   Color, Urine YELLOW (A) YELLOW   APPearance CLEAR (A) CLEAR   Specific Gravity, Urine 1.027 1.005 - 1.030   pH 7.0 5.0 - 8.0   Glucose, UA >=500 (A) NEGATIVE mg/dL   Hgb urine dipstick NEGATIVE NEGATIVE   Bilirubin Urine NEGATIVE NEGATIVE   Ketones, ur NEGATIVE NEGATIVE mg/dL   Protein, ur NEGATIVE NEGATIVE mg/dL   Nitrite NEGATIVE NEGATIVE   Leukocytes,Ua NEGATIVE NEGATIVE   RBC / HPF 0-5 0 - 5 RBC/hpf   WBC, UA 0-5 0 - 5 WBC/hpf   Bacteria, UA NONE SEEN NONE SEEN   Squamous Epithelial / HPF 0-5 0 - 5 /HPF    Assessment and Plan  Acute diverticulitis Assessment & Plan:  Acute, significant improvement with initiation of Augmentin.  Patient tolerating antibiotics well.  She will complete antibiotic course, continue to keep up with liquids and advance diet as tolerated. Return and ER precautions provided.   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   Trulicity 0.75 mg weekly.  Jardiance 10 mg daily.     No follow-ups on file.   Kerby Nora, MD

## 2023-03-21 NOTE — Assessment & Plan Note (Signed)
Acute, significant improvement with initiation of Augmentin.  Patient tolerating antibiotics well.  She will complete antibiotic course, continue to keep up with liquids and advance diet as tolerated. Return and ER precautions provided.

## 2023-03-25 ENCOUNTER — Ambulatory Visit: Payer: Medicare Other | Admitting: Family Medicine

## 2023-03-27 ENCOUNTER — Ambulatory Visit (INDEPENDENT_AMBULATORY_CARE_PROVIDER_SITE_OTHER): Payer: Medicare Other | Admitting: Pulmonary Disease

## 2023-03-27 ENCOUNTER — Encounter: Payer: Self-pay | Admitting: Pulmonary Disease

## 2023-03-27 VITALS — BP 138/72 | HR 75 | Temp 97.7°F | Ht 62.0 in | Wt 162.4 lb

## 2023-03-27 DIAGNOSIS — J479 Bronchiectasis, uncomplicated: Secondary | ICD-10-CM

## 2023-03-27 DIAGNOSIS — J683 Other acute and subacute respiratory conditions due to chemicals, gases, fumes and vapors: Secondary | ICD-10-CM

## 2023-03-27 DIAGNOSIS — G4733 Obstructive sleep apnea (adult) (pediatric): Secondary | ICD-10-CM

## 2023-03-27 DIAGNOSIS — I272 Pulmonary hypertension, unspecified: Secondary | ICD-10-CM

## 2023-03-27 DIAGNOSIS — R053 Chronic cough: Secondary | ICD-10-CM

## 2023-03-27 MED ORDER — BUDESONIDE-FORMOTEROL FUMARATE 160-4.5 MCG/ACT IN AERO: 2.0000 | INHALATION_SPRAY | Freq: Two times a day (BID) | RESPIRATORY_TRACT | 11 refills | Status: DC

## 2023-03-27 NOTE — Progress Notes (Signed)
Subjective:    Patient ID: Katie [name], female    DOB: 1945-09-22, 77 y.o.   MRN: [id number]  Patient Care Team: [name], MD as PCP - General (Family Medicine) Debbe Odea, MD as PCP - Cardiology (Cardiology) Galen Manila, MD as Referring Physician (Ophthalmology) Debbrah Alar, MD as Consulting Physician (Dermatology) Ricarda Frame, MD as Consulting Physician (General Surgery) Tobe Sos, MD as Consulting Physician (Dentistry) Glenna Fellows, MD (Inactive) as Consulting Physician (General Surgery) Serena Croissant, MD as Consulting Physician (Hematology and Oncology) Lonie Peak, MD as Attending Physician (Radiation Oncology) Emelia Loron, MD as Consulting Physician (General Surgery)  Chief Complaint  Patient presents with   Follow-up    Congestion. Cough with white to grey sputum. No SOB or wheezing.    HPI Katie [name] is a 77 year old lifelong never smoker, with a history of HER-2 positive breast cancer status post bilateral mastectomies, radiation and neoadjuvant chemotherapy.  She was initially seen here on 26 January 2020 for abnormal chest x-ray and for issues with cough that started after initiation of anastrozole.  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  At that time inhaled corticosteroids were started with relief of her cough.  She was last seen here on 01 October 2022 at that time she continued to do well on inhaled corticosteroids  with regards to her cough. She has also been noted to have mild pulmonary hypertension and this led to home sleep study which showed moderate sleep apnea.  She is currently using auto CPAP at 5 to 15 cm H2O of pressure.  She notes that she continues to do well with this.  She feels that she gets restorative sleep.  Previously she noted that she had difficulties because it appeared that the pressure ramps too high, this is associated with mask  leak. Her sleep gets disrupted because of this.I reviewed her compliance report and she has been compliant with CPAP despite her recent issues with the mask fitting.  She went back to her DME company to get refitted for a mask however she did not find a suitable mask.  We went brochures of different mask today to see what would be more appealing to her.  She appears to like the DreamWear masks by [employer].  Over the last few weeks she has noted that her cough has returned and she has had some chest congestion productive of whitish to gray sputum.  She has had no wheezing or shortness of breath.  Just the congestion.  Occasionally does note some chest tightness.  She has been compliant with budesonide Flexhaler.  She does not voice any other complaint today.  DATA: 09/24/2019 2D echo: LVEF 60 to 65%, normal LV function, indeterminate LV diastolic parameters. Mildly elevated pulmonary artery systolic pressure. 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  Very mild cylindrical bronchiectasis in the lungs bilaterally with some mucus impaction right lower lobe.  Mediastinal adenopathy. 02/17/2020 PFT: Normal spirometry and lung volumes 04/25/2020 HST: Moderate obstructive sleep apnea with AHI of 15.4 and SpO2 low of 76%, patient placed on auto CPAP at 5 to 20 cm H2O 08/31/2021 2D echo: LVEF 60 to 65%, grade I DD, mildly elevated pulmonary artery systolic pressure.  In essence, unchanged from prior  Review of Systems A 10 point review of systems was performed and it is as noted above otherwise negative.   Patient Active Problem List  Diagnosis Date Noted   Acute diverticulitis 03/21/2023   Abdominal pain, left lower quadrant 03/16/2023   Burning with urination 03/07/2023   Overweight (BMI 25.0-29.9) 03/05/2022   Elevated ferritin 03/23/2021   RUQ pain 02/23/2021   Decreased GFR 02/23/2021   Vitamin B 12 deficiency 02/23/2021   Leukopenia 02/23/2021    Bronchiectasis without complication (HCC) 01/26/2020   Aortic atherosclerosis (HCC) 12/23/2019   Atherosclerosis of coronary artery of native heart without angina pectoris 12/23/2019   Peripheral edema 12/22/2019   Chemotherapy-induced peripheral neuropathy (HCC) 10/29/2019   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   Anemia 10/15/2018   Port-A-Cath in place 09/29/2018   Diabetic retinopathy (HCC) 09/29/2018   Genetic testing 09/23/2018   Family history of leukemia 09/16/2018   Family history of breast cancer    Malignant neoplasm of upper-outer quadrant of left breast in female, 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/11/2018   Family history of breast cancer in sister 08/28/2018   Diverticular disease 10/17/2017   Primary osteoarthritis of right knee 03/07/2017   HSV-1 (herpes simplex virus 1) infection, 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   Vitamin D deficiency 10/11/2016   Skin rash 10/11/2016   Family history of thyroid disorder 04/04/2015   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   Counseling regarding end of life decision making 10/04/2014   OA (osteoarthritis) of neck 08/02/2014   History of duodenal ulcer 08/02/2014   Seasonal allergies 08/02/2014   Hypertension associated with diabetes (HCC) 08/02/2014   Hyperlipidemia associated with type 2 diabetes mellitus (HCC) 08/02/2014   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   History of joint replacement 03/01/2014   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) 11/07/2009    Social History   Tobacco Use   Smoking status: Never   Smokeless tobacco: Never  Substance Use Topics   Alcohol use: Not Currently    Alcohol/week: 0.0 standard drinks of alcohol    Comment: rarely    Allergies  Allergen Reactions   Erythromycin Anaphylaxis    REACTION: Rash, hives   Ciprofloxacin Rash   Daypro [Oxaprozin] Rash   Enalapril Maleate Nausea Only and Rash   Nsaids Nausea Only   Vioxx [Rofecoxib] Nausea Only    Current Meds  Medication Sig   alendronate  (FOSAMAX) 70 MG tablet TAKE 1 TABLET(70 MG) BY MOUTH 1 TIME A WEEK WITH A FULL GLASS OF WATER AND ON AN EMPTY STOMACH   anastrozole (ARIMIDEX) 1 MG tablet TAKE 1 TABLET(1 MG) BY MOUTH DAILY   atorvastatin (LIPITOR) 20 MG tablet Take 1 tablet (20 mg total) by mouth daily.   budesonide-formoterol (SYMBICORT) 160-4.5 MCG/ACT inhaler Inhale 2 puffs into the lungs 2 (two) times daily.   Calcium Carbonate-Vit D-Min (CALTRATE 600+D PLUS MINERALS) 600-800 MG-UNIT TABS Take 1 tablet by mouth in the morning and at bedtime.   clobetasol (TEMOVATE) 0.05 % external solution Apply 1 application topically 2 (two) times daily as needed (scalp). Reported on 08/16/2015   empagliflozin (JARDIANCE) 10 MG TABS tablet Take 1 tablet (10 mg total) by mouth daily before breakfast.   fenofibrate (TRICOR) 145 MG tablet Take 1 tablet (145 mg total) by mouth daily.   fluticasone (FLONASE) 50 MCG/ACT nasal spray SHAKE LIQUID AND USE 2 SPRAYS IN EACH NOSTRIL EVERY DAY   gabapentin (NEURONTIN) 300 MG capsule TAKE 1 CAPSULE(300 MG) BY MOUTH AT BEDTIME   glucose blood (FREESTYLE TEST STRIPS) test strip USE TO CHECK BLOOD SUGAR DAILY   loratadine (CLARITIN) 10 MG tablet Take 10 mg by mouth daily.   losartan-hydrochlorothiazide (HYZAAR) 100-25  MG tablet TAKE 1 TABLET BY MOUTH DAILY   metroNIDAZOLE (METROCREAM) 0.75 % cream Apply 1 Application topically 2 (two) times daily as needed.   Multiple Vitamins-Minerals (CENTRUM SILVER 50+WOMEN) TABS Take 1 tablet by mouth daily.   Probiotic Product (PROBIOTIC DAILY PO) Take by mouth. Schiff Digestive Advantage   psyllium (METAMUCIL) 58.6 % powder Take 1 packet by mouth daily.   TRULICITY 0.75 MG/0.5ML SOPN Inject 0.75 mg as directed once a week.   valACYclovir (VALTREX) 500 MG tablet Take 1 tablet (500 mg total) by mouth 2 (two) times daily.   [DISCONTINUED] PULMICORT FLEXHALER 180 MCG/ACT inhaler INHALE 2 PUFFS INTO THE LUNGS TWICE DAILY    Immunization History  Administered Date(s)  Administered   Fluad Quad(high Dose 65+) 04/30/2019, 05/12/2020, 04/06/2022   Hep A / Hep B 05/29/2009, 07/03/2009, 11/24/2009   Hepb-cpg 10/26/2021, 11/28/2021   Influenza, High Dose Seasonal PF 04/18/2014, 04/13/2015, 04/30/2018, 04/10/2021   Influenza, Seasonal, Injecte, Preservative Fre 06/26/2016   Influenza-Unspecified 05/17/2009, 04/02/2011, 05/20/2012, 06/02/2013, 07/01/2013, 04/18/2014, 04/13/2015, 06/27/2016, 04/11/2017   Moderna Covid-19 Vaccine Bivalent Booster 24yrs & up 04/06/2022   PFIZER Comirnaty(Gray Top)Covid-19 Tri-Sucrose Vaccine 11/29/2020   PFIZER(Purple Top)SARS-COV-2 Vaccination 10/06/2019, 10/27/2019, 04/24/2020   Pfizer Covid-19 Vaccine Bivalent Booster 64yrs & up 05/02/2021   Pneumococcal Conjugate-13 04/18/2014   Pneumococcal Polysaccharide-23 10/06/2015   Rsv, Bivalent, Protein Subunit Rsvpref,pf Verdis Frederickson) 06/04/2022   Td 05/17/2009, 05/12/2020   Typhoid Inactivated 05/17/2009   Unspecified SARS-COV-2 Vaccination 06/04/2022   Yellow Fever 05/17/2009   Zoster Recombinant(Shingrix) 06/07/2021, 08/11/2021   Zoster, Live 04/02/2011        Objective:     BP 138/72 (BP Location: Right Arm, Cuff Size: Normal)   Pulse 75   Temp 97.7 F (36.5 C)   Ht 5\' 2"  (1.575 m)   Wt 162 lb 6.4 oz (73.7 kg)   LMP 08/26/1992 (Approximate)   SpO2 96%   BMI 29.70 kg/m   SpO2: 96 % O2 Device: None (Room air)  GENERAL: Awake, alert, fully ambulatory, no acute distress.  No conversational dyspnea HEAD: Normocephalic, atraumatic. EYES: Pupils equal, round, reactive to light.  No scleral icterus. MOUTH: Nose/mouth/throat not examined due to masking requirements for COVID 19. NECK: Supple. No thyromegaly. Trachea midline. No JVD.  No adenopathy. PULMONARY: Excellent air entry bilaterally.  Lungs clear to auscultation bilaterally. CARDIOVASCULAR: S1 and S2. Regular rate and rhythm.  No rubs murmurs or gallops heard. GASTROINTESTINAL: Benign. MUSCULOSKELETAL: No joint  deformity, no clubbing, no edema. NEUROLOGIC: Awake, alert, no focal deficits.  Speech is fluent.  No gait disturbance noted, fully ambulatory. SKIN: Intact,warm,dry. PSYCH: Mood and behavior appropriate      Assessment & Plan:     ICD-10-CM   1. Bronchiectasis without complication (HCC)  J47.9 Pulmonary Function Test [facility] Only   No evidence of flare    2. Reactive airways dysfunction syndrome (HCC)  J68.3 Pulmonary Function Test [facility] Only   Reassess with PFTs Discontinue budesonide Budesonide/formoterol 160/4.5, 2 inhalations twice a day    3. Chronic cough  R05.3 Pulmonary Function Test [facility] Only   Recent flare Query due to reactive airways    4. Pulmonary hypertension (HCC)  I27.20    Follows with cardiology No clinical evidence of worsening    5. OSA on CPAP  G47.33 Ambulatory Referral for DME   Compliant with CPAP Continue auto CPAP 5-15 cm H2O DreamWear full facemask      Orders Placed This Encounter  Procedures   Ambulatory Referral for DME  Referral Priority:   Routine    Referral Type:   Durable Medical Equipment Purchase    Number of Visits Requested:   1   Pulmonary Function Test [facility] Only    Standing Status:   Future    Standing Expiration Date:   03/26/2024    Order Specific Question:   Full PFT: includes the following: basic spirometry, spirometry pre & post bronchodilator, diffusion capacity (DLCO), lung volumes    Answer:   Full PFT    Order Specific Question:   This test can only be performed at    Answer:   [facility]    Meds ordered this encounter  Medications   budesonide-formoterol (SYMBICORT) 160-4.5 MCG/ACT inhaler    Sig: Inhale 2 puffs into the lungs 2 (two) times daily.    Dispense:  10.2 g    Refill:  11   We have ordered a DreamWear full facemask for the patient.  Hopefully this will help with her issues with the CPAP.  Switching her inhaler from Pulmicort to Symbicort 2 puffs twice a day.  We will reassess her airways  reactivity with PFTs.  Will see the patient in follow-up in 2 to 3 months time call sooner should any new problems arise.   Gailen Shelter, MD Advanced Bronchoscopy PCCM [facility]    *This note was dictated using voice recognition software/Dragon.  Despite best efforts to proofread, errors can occur which can change the meaning. Any transcriptional errors that result from this process are unintentional and may not be fully corrected at the time of dictation.

## 2023-03-27 NOTE — Patient Instructions (Signed)
We have sent a request to see if [employer] can get a DreamWear full facemask for you.  This mask it is made by [employer].  I have switched your Pulmicort to Symbicort 2 puffs twice a day.  Make sure you rinse your mouth well after you use it.  We are scheduling breathing tests.  Will see him in follow-up in 2 to 3 months time call sooner should any new problems arise.

## 2023-03-31 ENCOUNTER — Other Ambulatory Visit (HOSPITAL_COMMUNITY): Payer: Self-pay

## 2023-04-01 ENCOUNTER — Encounter: Payer: Self-pay | Admitting: Pulmonary Disease

## 2023-04-01 MED ORDER — BUDESONIDE-FORMOTEROL FUMARATE 160-4.5 MCG/ACT IN AERO: 2.0000 | INHALATION_SPRAY | Freq: Two times a day (BID) | RESPIRATORY_TRACT | 12 refills | Status: DC

## 2023-04-01 NOTE — Telephone Encounter (Signed)
Looking at the formulary sheet provided, it looks like it is covered as a generic under budesonide/formoterol 160/4.5 this appears to be tier 1.  We can submit as generic.

## 2023-04-11 DIAGNOSIS — S76012A Strain of muscle, fascia and tendon of left hip, initial encounter: Secondary | ICD-10-CM | POA: Diagnosis not present

## 2023-04-11 DIAGNOSIS — M1612 Unilateral primary osteoarthritis, left hip: Secondary | ICD-10-CM | POA: Diagnosis not present

## 2023-04-11 DIAGNOSIS — M7062 Trochanteric bursitis, left hip: Secondary | ICD-10-CM | POA: Diagnosis not present

## 2023-04-15 ENCOUNTER — Encounter: Payer: Self-pay | Admitting: Internal Medicine

## 2023-04-15 ENCOUNTER — Ambulatory Visit (AMBULATORY_SURGERY_CENTER): Payer: Medicare Other | Admitting: Internal Medicine

## 2023-04-15 VITALS — BP 102/51 | HR 63 | Temp 97.1°F | Resp 18 | Ht 62.0 in | Wt 163.0 lb

## 2023-04-15 DIAGNOSIS — Z09 Encounter for follow-up examination after completed treatment for conditions other than malignant neoplasm: Secondary | ICD-10-CM | POA: Diagnosis not present

## 2023-04-15 DIAGNOSIS — D124 Benign neoplasm of descending colon: Secondary | ICD-10-CM

## 2023-04-15 DIAGNOSIS — I2510 Atherosclerotic heart disease of native coronary artery without angina pectoris: Secondary | ICD-10-CM | POA: Diagnosis not present

## 2023-04-15 DIAGNOSIS — Z86010 Personal history of colonic polyps: Secondary | ICD-10-CM

## 2023-04-15 DIAGNOSIS — E119 Type 2 diabetes mellitus without complications: Secondary | ICD-10-CM | POA: Diagnosis not present

## 2023-04-15 DIAGNOSIS — I10 Essential (primary) hypertension: Secondary | ICD-10-CM | POA: Diagnosis not present

## 2023-04-15 DIAGNOSIS — G4733 Obstructive sleep apnea (adult) (pediatric): Secondary | ICD-10-CM | POA: Diagnosis not present

## 2023-04-15 MED ORDER — SODIUM CHLORIDE 0.9 % IV SOLN: 500.0000 mL | Freq: Once | INTRAVENOUS | Status: DC

## 2023-04-15 NOTE — Progress Notes (Signed)
Report to PACU, RN, vss, BBS= Clear.  

## 2023-04-15 NOTE — Progress Notes (Signed)
Called to room to assist during endoscopic procedure.  Patient ID and intended procedure confirmed with present staff. Received instructions for my participation in the procedure from the performing physician.  

## 2023-04-15 NOTE — Addendum Note (Signed)
Addended by: Elmarie Mainland on: 04/15/2023 09:03 AM   Modules accepted: Orders

## 2023-04-15 NOTE — Patient Instructions (Signed)
Please read handouts provided Await pathology results.   YOU HAD AN ENDOSCOPIC PROCEDURE TODAY AT [facility]:   Refer to the procedure report that was given to you for any specific questions about what was found during the examination.  If the procedure report does not answer your questions, please call your gastroenterologist to clarify.  If you requested that your care partner not be given the details of your procedure findings, then the procedure report has been included in a sealed envelope for you to review at your convenience later.  YOU SHOULD EXPECT: Some feelings of bloating in the abdomen. Passage of more gas than usual.  Walking can help get rid of the air that was put into your GI tract during the procedure and reduce the bloating. If you had a lower endoscopy (such as a colonoscopy or flexible sigmoidoscopy) you may notice spotting of blood in your stool or on the toilet paper. If you underwent a bowel prep for your procedure, you may not have a normal bowel movement for a few days.  Please Note:  You might notice some irritation and congestion in your nose or some drainage.  This is from the oxygen used during your procedure.  There is no need for concern and it should clear up in a day or so.  SYMPTOMS TO REPORT IMMEDIATELY:  Following lower endoscopy (colonoscopy or flexible sigmoidoscopy):  Excessive amounts of blood in the stool  Significant tenderness or worsening of abdominal pains  Swelling of the abdomen that is new, acute  Fever of 100F or higher  For urgent or emergent issues, a gastroenterologist can be reached at any hour by calling [phone]. Do not use MyChart messaging for urgent concerns.    DIET:  We do recommend a small meal at first, but then you may proceed to your regular diet.  Drink plenty of fluids but you should avoid alcoholic beverages for 24 hours.  ACTIVITY:  You should plan to take it easy for the rest of today and you should  NOT DRIVE or use heavy machinery until tomorrow (because of the sedation medicines used during the test).    FOLLOW UP: Our staff will call the number listed on your records the next business day following your procedure.  We will call around 7:15- 8:00 am to check on you and address any questions or concerns that you may have regarding the information given to you following your procedure. If we do not reach you, we will leave a message.     If any biopsies were taken you will be contacted by phone or by letter within the next 1-3 weeks.  Please call us at [phone] if you have not heard about the biopsies in 3 weeks.    SIGNATURES/CONFIDENTIALITY: You and/or your care partner have signed paperwork which will be entered into your electronic medical record.  These signatures attest to the fact that that the information above on your After Visit Summary has been reviewed and is understood.  Full responsibility of the confidentiality of this discharge information lies with you and/or your care-partner.

## 2023-04-15 NOTE — Op Note (Signed)
[facility] Patient Name: Katie [name] Procedure Date: 04/15/2023 8:10 AM MRN: [id number] Endoscopist: Madelyn Brunner [place] , , [id number] Age: 77 Referring MD:  Date of Birth: [birth year] Gender: Female Account #: [id number] Procedure:                Colonoscopy Indications:              High risk colon cancer surveillance: Personal                            history of colonic polyps Medicines:                Monitored Anesthesia Care Procedure:                Pre-Anesthesia Assessment:                           - Prior to the procedure, a History and Physical                            was performed, and patient medications and                            allergies were reviewed. The patient's tolerance of                            previous anesthesia was also reviewed. The risks                            and benefits of the procedure and the sedation                            options and risks were discussed with the patient.                            All questions were answered, and informed consent                            was obtained. Prior Anticoagulants: The patient has                            taken no anticoagulant or antiplatelet agents. ASA                            Grade Assessment: II - A patient with mild systemic                            disease. After reviewing the risks and benefits,                            the patient was deemed in satisfactory condition to                            undergo the procedure.  After obtaining informed consent, the colonoscope                            was passed under direct vision. Throughout the                            procedure, the patient's blood pressure, pulse, and                            oxygen saturations were monitored continuously. The                            Olympus Scope SN: [id number] was introduced through                            the anus and advanced to the the  terminal ileum.                            The colonoscopy was performed without difficulty.                            The patient tolerated the procedure well. The                            quality of the bowel preparation was good. The                            terminal ileum, ileocecal valve, appendiceal                            orifice, and rectum were photographed. Scope In: 8:13:48 AM Scope Out: 8:31:14 AM Scope Withdrawal Time: 0 hours 10 minutes 54 seconds  Total Procedure Duration: 0 hours 17 minutes 26 seconds  Findings:                 The terminal ileum appeared normal.                           Multiple diverticula were found in the entire colon.                           A 4 mm polyp was found in the descending colon. The                            polyp was sessile. The polyp was removed with a                            cold snare. Resection and retrieval were complete.                           Non-bleeding internal hemorrhoids were found during                            retroflexion. Complications:  No immediate complications. Estimated Blood Loss:     Estimated blood loss was minimal. Impression:               - The examined portion of the ileum was normal.                           - Diverticulosis in the entire examined colon.                           - One 4 mm polyp in the descending colon, removed                            with a cold snare. Resected and retrieved.                           - Non-bleeding internal hemorrhoids. Recommendation:           - Discharge patient to home (with escort).                           - Await pathology results.                           - The findings and recommendations were discussed                            with the patient. Dr Particia Lather "[address]" [place],  04/15/2023 8:39:46 AM

## 2023-04-15 NOTE — Progress Notes (Signed)
GASTROENTEROLOGY PROCEDURE H&P NOTE   Primary Care Physician: [name], MD    Reason for Procedure:   History of colon polyps  Plan:    Colonoscopy  Patient is appropriate for endoscopic procedure(s) in the ambulatory ([facility]) setting.  The nature of the procedure, as well as the risks, benefits, and alternatives were carefully and thoroughly reviewed with the patient. Ample time for discussion and questions allowed. The patient understood, was satisfied, and agreed to proceed.     HPI: Katie [name] is a 77 y.o. female who presents for colonoscopy for evaluation of history of colon polyps .  Patient was most recently seen in the Gastroenterology Clinic on 01/28/23.  She did have an episode of diverticulitis a month ago. No residual abdominal pain any longer. Please refer to that note for full details regarding GI history and clinical presentation.   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, small, not treating-watching , it is now stented   Blood transfusion without reported diagnosis    Cancer (HCC)    breast 08/2018   Coronary artery disease    Diverticulitis    DJD (degenerative joint disease)    DM type 2 (diabetes mellitus, type 2) (HCC)    Family history of breast cancer    Fibrocystic breast disease    GERD (gastroesophageal reflux disease)    History of chicken pox    Hypercholesteremia    Hypertension    Lichen planopilaris    Neuromuscular disorder (HCC)    Sleep apnea    cpap   Urinary incontinence    Vertigo 12/2019   1 episode    Past Surgical History:  Procedure Laterality Date   BREAST IMPLANT REMOVAL Bilateral 03/08/2019   Procedure: Removal of Bilateral Breast Implants;  Surgeon: Emelia Loron, MD;  Location: [facility] OR;  Service: General;  Laterality: Bilateral;   BREAST SURGERY  02/1975   Left Breast Biopsy-Fibrocystic Disease   BREAST SURGERY  10/1981   Bialteral Capsulectomy Breast  Surgery   BREAST SURGERY  07/1998   Replace Bilateral Silicone Implants   BREAST SURGERY  01/1976   Bilater breast surgery to remove fibrocystic tissue and replace with silicone implants   CATARACT EXTRACTION W/PHACO Right 02/08/2020   Procedure: CATARACT EXTRACTION PHACO AND INTRAOCULAR LENS PLACEMENT (IOC) RIGHT DIABETIC;  Surgeon: Galen Manila, MD;  Location: [facility];  Service: Ophthalmology;  Laterality: Right;  3.55 0:33.7   CATARACT EXTRACTION W/PHACO Left 02/29/2020   Procedure: CATARACT EXTRACTION PHACO AND INTRAOCULAR LENS PLACEMENT (IOC) LEFT DIABETIC 2.93  00:27.1;  Surgeon: Galen Manila, MD;  Location: [facility];  Service: Ophthalmology;  Laterality: Left;  Diabetic - oral meds   CHOLECYSTECTOMY N/A 07/08/2016   Procedure: LAPAROSCOPIC CHOLECYSTECTOMY;  Surgeon: Ricarda Frame, MD;  Location: [facility] ORS;  Service: General;  Laterality: N/A;   COLONOSCOPY WITH PROPOFOL N/A 11/07/2017   Procedure: COLONOSCOPY WITH PROPOFOL;  Surgeon: Wyline Mood, MD;  Location: [facility] ENDOSCOPY;  Service: Gastroenterology;  Laterality: N/A;   DILATION AND CURETTAGE OF UTERUS  01/1994   ESOPHAGOGASTRODUODENOSCOPY (EGD) WITH PROPOFOL N/A 12/05/2016   Procedure: ESOPHAGOGASTRODUODENOSCOPY (EGD) WITH PROPOFOL;  Surgeon: Wyline Mood, MD;  Location: [facility] ENDOSCOPY;  Service: Endoscopy;  Laterality: N/A;   FINGER SURGERY  2001 & 2003   Trigger Finger   FOOT SURGERY  1980   To Relieve Pinched Nerve   FOOT SURGERY  07/2008  Left Foot-Plantar Fasciitis   JOINT REPLACEMENT  04/19/2013   Hip Replacement-Right   MASTECTOMY     double   MASTECTOMY W/ SENTINEL NODE BIOPSY Bilateral 03/08/2019   Procedure: BILATERAL MASTECTOMIES WITH LEFT AXILLARY SENTINEL LYMPH NODE BIOPSY AND BLUE DYE INJECTION;  Surgeon: Emelia Loron, MD;  Location: [facility] OR;  Service: General;  Laterality: Bilateral;   PARTIAL KNEE ARTHROPLASTY Right 12/31/2022   Procedure: RIGHT PARTIAL KNEE ARTHROPLASTY;   Surgeon: Christena Flake, MD;  Location: [facility] ORS;  Service: Orthopedics;  Laterality: Right;   PLACEMENT OF BREAST IMPLANTS  12/26/2004   Replace Failed Implants   REPLACEMENT TOTAL KNEE Left    2019-   RHINOPLASTY  03/1964   To Correct Deviated Septum    Prior to Admission medications   Medication Sig Start Date End Date Taking? Authorizing Provider  anastrozole (ARIMIDEX) 1 MG tablet TAKE 1 TABLET(1 MG) BY MOUTH DAILY 03/03/23  Yes Serena Croissant, MD  atorvastatin (LIPITOR) 20 MG tablet Take 1 tablet (20 mg total) by mouth daily. 11/15/22  Yes Agbor-Etang, Arlys John, MD  budesonide-formoterol [facility]) 160-4.5 MCG/ACT inhaler Inhale 2 puffs into the lungs in the morning and at bedtime. 04/01/23  Yes Salena Saner, MD  empagliflozin (JARDIANCE) 10 MG TABS tablet Take 1 tablet (10 mg total) by mouth daily before breakfast. 01/07/23  Yes Bedsole, Amy E, MD  fenofibrate (TRICOR) 145 MG tablet Take 1 tablet (145 mg total) by mouth daily. 11/15/22  Yes Agbor-Etang, Arlys John, MD  fluticasone (FLONASE) 50 MCG/ACT nasal spray SHAKE LIQUID AND USE 2 SPRAYS IN EACH NOSTRIL EVERY DAY 04/01/22  Yes Bedsole, Amy E, MD  gabapentin (NEURONTIN) 300 MG capsule TAKE 1 CAPSULE(300 MG) BY MOUTH AT BEDTIME 07/01/22  Yes Serena Croissant, MD  glucose blood (FREESTYLE TEST STRIPS) test strip USE TO CHECK BLOOD SUGAR DAILY 04/08/21  Yes Bedsole, Amy E, MD  loratadine (CLARITIN) 10 MG tablet Take 10 mg by mouth daily.   Yes [provider]  losartan-hydrochlorothiazide (HYZAAR) 100-25 MG tablet TAKE 1 TABLET BY MOUTH DAILY 12/05/22  Yes Bedsole, Amy E, MD  alendronate (FOSAMAX) 70 MG tablet TAKE 1 TABLET(70 MG) BY MOUTH 1 TIME A WEEK WITH A FULL GLASS OF WATER AND ON AN EMPTY STOMACH 07/31/22   Serena Croissant, MD  Calcium Carbonate-Vit D-Min (CALTRATE 600+D PLUS MINERALS) 600-800 MG-UNIT TABS Take 1 tablet by mouth in the morning and at bedtime.    [provider]  clobetasol (TEMOVATE) 0.05 % external solution Apply  1 application topically 2 (two) times daily as needed (scalp). Reported on 08/16/2015 06/02/14   [provider]  metroNIDAZOLE (METROCREAM) 0.75 % cream Apply 1 Application topically 2 (two) times daily as needed. 01/03/23   [provider]  Multiple Vitamins-Minerals (CENTRUM SILVER 50+WOMEN) TABS Take 1 tablet by mouth daily.    [provider]  Polyethyl Glycol-Propyl Glycol (SYSTANE OP) Apply to eye as needed. Patient not taking: Reported on 03/27/2023    [provider]  Probiotic Product (PROBIOTIC DAILY PO) Take by mouth. [employer] Dig[employer] Advantage    [provider]  psyllium (METAMUCIL) 58.6 % powder Take 1 packet by mouth daily.    [provider]  TRULICITY 0.75 MG/0.5ML SOPN Inject 0.75 mg as directed once a week. 12/06/22   Bedsole, Amy E, MD  valACYclovir (VALTREX) 500 MG tablet Take 1 tablet (500 mg total) by mouth 2 (two) times daily. 12/31/22   Poggi, [name], MD  prochlorperazine (COMPAZINE) 10 MG tablet Take 1  tablet (10 mg total) by mouth every 6 (six) hours as needed (Nausea or vomiting). 09/16/18 01/20/19  Serena Croissant, MD    Current Outpatient Medications  Medication Sig Dispense Refill   anastrozole (ARIMIDEX) 1 MG tablet TAKE 1 TABLET(1 MG) BY MOUTH DAILY 90 tablet 3   atorvastatin (LIPITOR) 20 MG tablet Take 1 tablet (20 mg total) by mouth daily. 90 tablet 2   budesonide-formoterol (SYMBICORT) 160-4.5 MCG/ACT inhaler Inhale 2 puffs into the lungs in the morning and at bedtime. 10.2 g 12   empagliflozin (JARDIANCE) 10 MG TABS tablet Take 1 tablet (10 mg total) by mouth daily before breakfast. 30 tablet 11   fenofibrate (TRICOR) 145 MG tablet Take 1 tablet (145 mg total) by mouth daily. 90 tablet 2   fluticasone (FLONASE) 50 MCG/ACT nasal spray SHAKE LIQUID AND USE 2 SPRAYS IN EACH NOSTRIL EVERY DAY 16 g 11   gabapentin (NEURONTIN) 300 MG capsule TAKE 1 CAPSULE(300 MG) BY MOUTH AT BEDTIME 90 capsule 3   glucose blood  (FREESTYLE TEST STRIPS) test strip USE TO CHECK BLOOD SUGAR DAILY 50 strip 11   loratadine (CLARITIN) 10 MG tablet Take 10 mg by mouth daily.     losartan-hydrochlorothiazide (HYZAAR) 100-25 MG tablet TAKE 1 TABLET BY MOUTH DAILY 90 tablet 1   alendronate (FOSAMAX) 70 MG tablet TAKE 1 TABLET(70 MG) BY MOUTH 1 TIME A WEEK WITH A FULL GLASS OF WATER AND ON AN EMPTY STOMACH 12 tablet 3   Calcium Carbonate-Vit D-Min (CALTRATE 600+D PLUS MINERALS) 600-800 MG-UNIT TABS Take 1 tablet by mouth in the morning and at bedtime.     clobetasol (TEMOVATE) 0.05 % external solution Apply 1 application topically 2 (two) times daily as needed (scalp). Reported on 08/16/2015     metroNIDAZOLE (METROCREAM) 0.75 % cream Apply 1 Application topically 2 (two) times daily as needed.     Multiple Vitamins-Minerals (CENTRUM SILVER 50+WOMEN) TABS Take 1 tablet by mouth daily.     Polyethyl Glycol-Propyl Glycol (SYSTANE OP) Apply to eye as needed. (Patient not taking: Reported on 03/27/2023)     Probiotic Product (PROBIOTIC DAILY PO) Take by mouth. [employer]iff Digestive Advantage     psyllium (METAMUCIL) 58.6 % powder Take 1 packet by mouth daily.     TRULICITY 0.75 MG/0.5ML SOPN Inject 0.75 mg as directed once a week. 2 mL 5   valACYclovir (VALTREX) 500 MG tablet Take 1 tablet (500 mg total) by mouth 2 (two) times daily.     Current Facility-Administered Medications  Medication Dose Route Frequency Provider Last Rate Last Admin   0.9 %  sodium chloride infusion  500 mL Intravenous Once Imogene Burn, MD        Allergies as of 04/15/2023 - Review Complete 04/15/2023  Allergen Reaction Noted   Erythromycin Anaphylaxis 06/26/2009   Ciprofloxacin Rash 06/26/2009   Daypro [oxaprozin] Rash 10/13/2012   Enalapril maleate Nausea Only and Rash 10/06/2015   Nsaids Nausea Only 10/13/2012   Vioxx [rofecoxib] Nausea Only 10/13/2012    Family History  Problem Relation Age of Onset   Arthritis Mother    Hypertension Mother     Heart disease Father    Diabetes Father    Heart attack Father    Breast cancer Sister 91       d. 44   Hypertension Brother    Leukemia Brother    Coronary artery disease Brother    Heart attack Brother     Social History   Socioeconomic History  Marital status: Married    Spouse name: Debroah Loop   Number of children: 1   Years of education: Not on file   Highest education level: Master's degree (e.g., MA, MS, MEng, MEd, MSW, MBA)  Occupational History   Occupation: Retired  Tobacco Use   Smoking status: Never   Smokeless tobacco: Never  Vaping Use   Vaping status: Never Used  Substance and Sexual Activity   Alcohol use: Not Currently    Alcohol/week: 0.0 standard drinks of alcohol    Comment: rarely   Drug use: No   Sexual activity: Not Currently    Partners: Male  Other Topics Concern   Not on file  Social History Narrative   Married for 37 years.    She has one child and one stepston.      Social Determinants of Health   Financial Resource Strain: Low Risk  (12/05/2022)   Overall Financial Resource Strain (CARDIA)    Difficulty of Paying Living Expenses: Not hard at all  Food Insecurity: No Food Insecurity (12/05/2022)   Hunger Vital Sign    Worried About Running Out of Food in the Last Year: Never true    Ran Out of Food in the Last Year: Never true  Transportation Needs: No Transportation Needs (12/05/2022)   PRAPARE - Administrator, Civil Service (Medical): No    Lack of Transportation (Non-Medical): No  Physical Activity: Sufficiently Active (12/05/2022)   Exercise Vital Sign    Days of Exercise per Week: 3 days    Minutes of Exercise per Session: 60 min  Stress: No Stress Concern Present (12/05/2022)   [employer] of Occupational Health - Occupational Stress Questionnaire    Feeling of Stress : Not at all  Social Connections: Unknown (12/05/2022)   Social Connection and Isolation Panel [NHANES]    Frequency of Communication with Friends  and Family: Once a week    Frequency of Social Gatherings with Friends and Family: Once a week    Attends Religious Services: More than 4 times per year    Active Member of [employer] or Organizations: Patient declined    Attends Banker Meetings: Not on file    Marital Status: Married  Intimate Partner Violence: Not At Risk (05/02/2020)   Humiliation, Afraid, Rape, and Kick questionnaire    Fear of Current or Ex-Partner: No    Emotionally Abused: No    Physically Abused: No    Sexually Abused: No    Physical Exam: Vital signs in last 24 hours: BP 119/61   Pulse 73   Temp (!) 97.1 F (36.2 C)   Ht 5\' 2"  (1.575 m)   Wt 163 lb (73.9 kg)   LMP 08/26/1992 (Approximate)   SpO2 96%   BMI 29.81 kg/m  GEN: NAD EYE: Sclerae anicteric ENT: MMM CV: Non-tachycardic Pulm: No increased WOB GI: Soft NEURO:  Alert & Oriented   Eulah Pont, MD [facility]   04/15/2023 8:09 AM

## 2023-04-16 ENCOUNTER — Telehealth: Payer: Self-pay

## 2023-04-16 NOTE — Telephone Encounter (Signed)
  Follow up Call-     04/15/2023    7:07 AM 09/11/2021    9:28 AM  Call back number  Post procedure Call Back phone  # [phone]  Permission to leave phone message Yes Yes     Patient questions:  Do you have a fever, pain , or abdominal swelling? No. Pain Score  0 *  Have you tolerated food without any problems? Yes.    Have you been able to return to your normal activities? Yes.    Do you have any questions about your discharge instructions: Diet   No. Medications  No. Follow up visit  No.  Do you have questions or concerns about your Care? No.  Actions: * If pain score is 4 or above: No action needed, pain <4.

## 2023-04-21 ENCOUNTER — Encounter: Payer: Self-pay | Admitting: Internal Medicine

## 2023-05-06 DIAGNOSIS — M25552 Pain in left hip: Secondary | ICD-10-CM | POA: Diagnosis not present

## 2023-05-06 DIAGNOSIS — M7062 Trochanteric bursitis, left hip: Secondary | ICD-10-CM | POA: Diagnosis not present

## 2023-05-06 DIAGNOSIS — M1612 Unilateral primary osteoarthritis, left hip: Secondary | ICD-10-CM | POA: Diagnosis not present

## 2023-05-08 ENCOUNTER — Encounter: Payer: Self-pay | Admitting: Family Medicine

## 2023-05-09 DIAGNOSIS — M7062 Trochanteric bursitis, left hip: Secondary | ICD-10-CM | POA: Diagnosis not present

## 2023-05-09 DIAGNOSIS — M25552 Pain in left hip: Secondary | ICD-10-CM | POA: Diagnosis not present

## 2023-05-09 DIAGNOSIS — M1612 Unilateral primary osteoarthritis, left hip: Secondary | ICD-10-CM | POA: Diagnosis not present

## 2023-05-14 DIAGNOSIS — M7062 Trochanteric bursitis, left hip: Secondary | ICD-10-CM | POA: Diagnosis not present

## 2023-05-14 DIAGNOSIS — M1612 Unilateral primary osteoarthritis, left hip: Secondary | ICD-10-CM | POA: Diagnosis not present

## 2023-05-14 DIAGNOSIS — M25552 Pain in left hip: Secondary | ICD-10-CM | POA: Diagnosis not present

## 2023-05-16 ENCOUNTER — Telehealth: Payer: Self-pay | Admitting: *Deleted

## 2023-05-16 DIAGNOSIS — M7062 Trochanteric bursitis, left hip: Secondary | ICD-10-CM | POA: Diagnosis not present

## 2023-05-16 DIAGNOSIS — M25552 Pain in left hip: Secondary | ICD-10-CM | POA: Diagnosis not present

## 2023-05-16 DIAGNOSIS — M1612 Unilateral primary osteoarthritis, left hip: Secondary | ICD-10-CM | POA: Diagnosis not present

## 2023-05-16 DIAGNOSIS — E113293 Type 2 diabetes mellitus with mild nonproliferative diabetic retinopathy without macular edema, bilateral: Secondary | ICD-10-CM

## 2023-05-16 DIAGNOSIS — E538 Deficiency of other specified B group vitamins: Secondary | ICD-10-CM

## 2023-05-16 DIAGNOSIS — E559 Vitamin D deficiency, unspecified: Secondary | ICD-10-CM

## 2023-05-16 DIAGNOSIS — E1169 Type 2 diabetes mellitus with other specified complication: Secondary | ICD-10-CM

## 2023-05-16 DIAGNOSIS — D509 Iron deficiency anemia, unspecified: Secondary | ICD-10-CM

## 2023-05-16 NOTE — Telephone Encounter (Signed)
-----   Message from Lovena Neighbours sent at 05/16/2023  9:08 AM EDT ----- Regarding: Labs for Tuesday 10.8.24 Please put physical lab orders in future. Thank you, Denny Peon

## 2023-05-20 DIAGNOSIS — M25552 Pain in left hip: Secondary | ICD-10-CM | POA: Diagnosis not present

## 2023-05-20 DIAGNOSIS — M1612 Unilateral primary osteoarthritis, left hip: Secondary | ICD-10-CM | POA: Diagnosis not present

## 2023-05-20 DIAGNOSIS — M7062 Trochanteric bursitis, left hip: Secondary | ICD-10-CM | POA: Diagnosis not present

## 2023-05-22 DIAGNOSIS — M7062 Trochanteric bursitis, left hip: Secondary | ICD-10-CM | POA: Diagnosis not present

## 2023-05-22 DIAGNOSIS — M1612 Unilateral primary osteoarthritis, left hip: Secondary | ICD-10-CM | POA: Diagnosis not present

## 2023-05-22 DIAGNOSIS — M25552 Pain in left hip: Secondary | ICD-10-CM | POA: Diagnosis not present

## 2023-05-27 ENCOUNTER — Ambulatory Visit: Payer: Medicare Other | Attending: Pulmonary Disease

## 2023-05-27 DIAGNOSIS — R053 Chronic cough: Secondary | ICD-10-CM | POA: Diagnosis not present

## 2023-05-27 DIAGNOSIS — J683 Other acute and subacute respiratory conditions due to chemicals, gases, fumes and vapors: Secondary | ICD-10-CM | POA: Insufficient documentation

## 2023-05-27 DIAGNOSIS — M1612 Unilateral primary osteoarthritis, left hip: Secondary | ICD-10-CM | POA: Diagnosis not present

## 2023-05-27 DIAGNOSIS — R0609 Other forms of dyspnea: Secondary | ICD-10-CM | POA: Diagnosis not present

## 2023-05-27 DIAGNOSIS — J479 Bronchiectasis, uncomplicated: Secondary | ICD-10-CM | POA: Diagnosis not present

## 2023-05-27 DIAGNOSIS — M7062 Trochanteric bursitis, left hip: Secondary | ICD-10-CM | POA: Diagnosis not present

## 2023-05-27 DIAGNOSIS — M25552 Pain in left hip: Secondary | ICD-10-CM | POA: Diagnosis not present

## 2023-05-27 LAB — PULMONARY FUNCTION TEST ARMC ONLY
DL/VA % pred: 75 %
DL/VA: 3.15 ml/min/mmHg/L
DLCO unc % pred: 68 %
DLCO unc: 12.17 ml/min/mmHg
FEF 25-75 Post: 3.8 L/s
FEF 25-75 Pre: 3.16 L/s
FEF2575-%Change-Post: 20 %
FEF2575-%Pred-Post: 272 %
FEF2575-%Pred-Pre: 226 %
FEV1-%Change-Post: 5 %
FEV1-%Pred-Post: 124 %
FEV1-%Pred-Pre: 117 %
FEV1-Post: 2.26 L
FEV1-Pre: 2.15 L
FEV1FVC-%Change-Post: -6 %
FEV1FVC-%Pred-Pre: 120 %
FEV6-%Change-Post: 12 %
FEV6-%Pred-Post: 116 %
FEV6-%Pred-Pre: 103 %
FEV6-Post: 2.7 L
FEV6-Pre: 2.41 L
FEV6FVC-%Pred-Post: 105 %
FEV6FVC-%Pred-Pre: 105 %
FVC-%Change-Post: 12 %
FVC-%Pred-Post: 110 %
FVC-%Pred-Pre: 98 %
FVC-Post: 2.71 L
FVC-Pre: 2.41 L
Post FEV1/FVC ratio: 83 %
Post FEV6/FVC ratio: 100 %
Pre FEV1/FVC ratio: 89 %
Pre FEV6/FVC Ratio: 100 %
RV % pred: 77 %
RV: 1.75 L
TLC % pred: 80 %
TLC: 3.8 L

## 2023-05-27 MED ORDER — ALBUTEROL SULFATE (2.5 MG/3ML) 0.083% IN NEBU
2.5000 mg | INHALATION_SOLUTION | Freq: Once | RESPIRATORY_TRACT | Status: AC
  Administered 2023-05-27: 2.5 mg via RESPIRATORY_TRACT
  Filled 2023-05-27: qty 3

## 2023-05-30 DIAGNOSIS — M7062 Trochanteric bursitis, left hip: Secondary | ICD-10-CM | POA: Diagnosis not present

## 2023-05-30 DIAGNOSIS — M1612 Unilateral primary osteoarthritis, left hip: Secondary | ICD-10-CM | POA: Diagnosis not present

## 2023-05-30 DIAGNOSIS — M25552 Pain in left hip: Secondary | ICD-10-CM | POA: Diagnosis not present

## 2023-05-31 ENCOUNTER — Other Ambulatory Visit: Payer: Self-pay | Admitting: Hematology and Oncology

## 2023-06-01 ENCOUNTER — Other Ambulatory Visit: Payer: Self-pay | Admitting: Pulmonary Disease

## 2023-06-03 ENCOUNTER — Encounter: Payer: Self-pay | Admitting: Pulmonary Disease

## 2023-06-03 ENCOUNTER — Other Ambulatory Visit (INDEPENDENT_AMBULATORY_CARE_PROVIDER_SITE_OTHER): Payer: Medicare Other

## 2023-06-03 ENCOUNTER — Ambulatory Visit: Payer: Medicare Other | Admitting: Pulmonary Disease

## 2023-06-03 VITALS — BP 122/72 | HR 75 | Temp 97.5°F | Ht 62.0 in | Wt 165.2 lb

## 2023-06-03 DIAGNOSIS — E113293 Type 2 diabetes mellitus with mild nonproliferative diabetic retinopathy without macular edema, bilateral: Secondary | ICD-10-CM

## 2023-06-03 DIAGNOSIS — Z170 Estrogen receptor positive status [ER+]: Secondary | ICD-10-CM | POA: Diagnosis not present

## 2023-06-03 DIAGNOSIS — D509 Iron deficiency anemia, unspecified: Secondary | ICD-10-CM

## 2023-06-03 DIAGNOSIS — E785 Hyperlipidemia, unspecified: Secondary | ICD-10-CM | POA: Diagnosis not present

## 2023-06-03 DIAGNOSIS — E559 Vitamin D deficiency, unspecified: Secondary | ICD-10-CM

## 2023-06-03 DIAGNOSIS — I272 Pulmonary hypertension, unspecified: Secondary | ICD-10-CM | POA: Diagnosis not present

## 2023-06-03 DIAGNOSIS — C50412 Malignant neoplasm of upper-outer quadrant of left female breast: Secondary | ICD-10-CM

## 2023-06-03 DIAGNOSIS — E1169 Type 2 diabetes mellitus with other specified complication: Secondary | ICD-10-CM | POA: Diagnosis not present

## 2023-06-03 DIAGNOSIS — R053 Chronic cough: Secondary | ICD-10-CM | POA: Diagnosis not present

## 2023-06-03 DIAGNOSIS — J683 Other acute and subacute respiratory conditions due to chemicals, gases, fumes and vapors: Secondary | ICD-10-CM | POA: Diagnosis not present

## 2023-06-03 DIAGNOSIS — E538 Deficiency of other specified B group vitamins: Secondary | ICD-10-CM | POA: Diagnosis not present

## 2023-06-03 DIAGNOSIS — G4733 Obstructive sleep apnea (adult) (pediatric): Secondary | ICD-10-CM

## 2023-06-03 DIAGNOSIS — J479 Bronchiectasis, uncomplicated: Secondary | ICD-10-CM

## 2023-06-03 LAB — COMPREHENSIVE METABOLIC PANEL
ALT: 24 U/L (ref 0–35)
AST: 24 U/L (ref 0–37)
Albumin: 4.3 g/dL (ref 3.5–5.2)
Alkaline Phosphatase: 38 U/L — ABNORMAL LOW (ref 39–117)
BUN: 27 mg/dL — ABNORMAL HIGH (ref 6–23)
CO2: 28 meq/L (ref 19–32)
Calcium: 10.6 mg/dL — ABNORMAL HIGH (ref 8.4–10.5)
Chloride: 100 meq/L (ref 96–112)
Creatinine, Ser: 1.22 mg/dL — ABNORMAL HIGH (ref 0.40–1.20)
GFR: 42.51 mL/min — ABNORMAL LOW (ref >60.00)
Glucose, Bld: 167 mg/dL — ABNORMAL HIGH (ref 70–99)
Potassium: 4.1 meq/L (ref 3.5–5.1)
Sodium: 138 meq/L (ref 135–145)
Total Bilirubin: 0.5 mg/dL (ref 0.2–1.2)
Total Protein: 7 g/dL (ref 6.0–8.3)

## 2023-06-03 LAB — IBC + FERRITIN
Ferritin: 286.9 ng/mL (ref 10.0–291.0)
Iron: 94 ug/dL (ref 42–145)
Saturation Ratios: 17.9 % — ABNORMAL LOW (ref 20.0–50.0)
TIBC: 525 ug/dL — ABNORMAL HIGH (ref 250.0–450.0)
Transferrin: 375 mg/dL — ABNORMAL HIGH (ref 212.0–360.0)

## 2023-06-03 LAB — LIPID PANEL
Cholesterol: 176 mg/dL (ref 0–200)
HDL: 44.3 mg/dL (ref >39.00)
LDL Cholesterol: 74 mg/dL (ref 0–99)
NonHDL: 131.61
Total CHOL/HDL Ratio: 4
Triglycerides: 286 mg/dL — ABNORMAL HIGH (ref 0.0–149.0)
VLDL: 57.2 mg/dL — ABNORMAL HIGH (ref 0.0–40.0)

## 2023-06-03 LAB — CBC WITH DIFFERENTIAL/PLATELET
Basophils Absolute: 0 10*3/uL (ref 0.0–0.1)
Basophils Relative: 0.7 % (ref 0.0–3.0)
Eosinophils Absolute: 0.1 10*3/uL (ref 0.0–0.7)
Eosinophils Relative: 2.6 % (ref 0.0–5.0)
HCT: 39.6 % (ref 36.0–46.0)
Hemoglobin: 12.9 g/dL (ref 12.0–15.0)
Lymphocytes Relative: 25.4 % (ref 12.0–46.0)
Lymphs Abs: 1 10*3/uL (ref 0.7–4.0)
MCHC: 32.5 g/dL (ref 30.0–36.0)
MCV: 100.4 fL — ABNORMAL HIGH (ref 78.0–100.0)
Monocytes Absolute: 0.5 10*3/uL (ref 0.1–1.0)
Monocytes Relative: 13.1 % — ABNORMAL HIGH (ref 3.0–12.0)
Neutro Abs: 2.2 10*3/uL (ref 1.4–7.7)
Neutrophils Relative %: 58.2 % (ref 43.0–77.0)
Platelets: 290 10*3/uL (ref 150.0–400.0)
RBC: 3.94 Mil/uL (ref 3.87–5.11)
RDW: 15.1 % (ref 11.5–15.5)
WBC: 3.8 10*3/uL — ABNORMAL LOW (ref 4.0–10.5)

## 2023-06-03 LAB — VITAMIN D 25 HYDROXY (VIT D DEFICIENCY, FRACTURES): VITD: 47.2 ng/mL (ref 30.00–100.00)

## 2023-06-03 LAB — MICROALBUMIN / CREATININE URINE RATIO
Creatinine,U: 62.6 mg/dL
Microalb Creat Ratio: 2.8 mg/g (ref 0.0–30.0)
Microalb, Ur: 1.7 mg/dL (ref 0.0–1.9)

## 2023-06-03 LAB — VITAMIN B12: Vitamin B-12: 390 pg/mL (ref 211–911)

## 2023-06-03 LAB — HEMOGLOBIN A1C: Hgb A1c MFr Bld: 7.2 % — ABNORMAL HIGH (ref 4.6–6.5)

## 2023-06-03 NOTE — Patient Instructions (Addendum)
We discussed the results of your breathing tests.  May try substituting Claritin for Zyrtec at bedtime to see if this helps with your postnasal drip.  May also help your cough.  Your compliance with the CPAP is excellent.  We will see you in follow-up in 4 to 6 months.  Call sooner should any new problems arise.

## 2023-06-03 NOTE — Progress Notes (Signed)
Subjective:    Patient ID: Katie [name], female    DOB: [birth year], 77 y.o.   MRN: [id number]  Patient Care Team: [name], MD as PCP - General (Family Medicine) Debbe Odea, MD as PCP - Cardiology (Cardiology) Galen Manila, MD as Referring Physician (Ophthalmology) Debbrah Alar, MD as Consulting Physician (Dermatology) Ricarda Frame, MD as Consulting Physician (General Surgery) Tobe Sos, MD as Consulting Physician (Dentistry) Glenna Fellows, MD (Inactive) as Consulting Physician (General Surgery) Serena Croissant, MD as Consulting Physician (Hematology and Oncology) Lonie Peak, MD as Attending Physician (Radiation Oncology) Emelia Loron, MD as Consulting Physician (General Surgery)  Chief Complaint  Patient presents with   Follow-up    Breathing is good. No SOB or wheezing. Some cough. CPAP ok. Mask- has problems getting a good seal. Pressure- is fine    HPI [name] is a 77 year old lifelong never smoker, with a history of HER-2 positive breast cancer status post bilateral mastectomies, radiation and neoadjuvant chemotherapy.  She was initially seen here on 26 January 2020 for abnormal chest x-ray and for issues with cough that started after initiation of anastrozole.  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  At that time inhaled corticosteroids were started with relief of her cough.  She was last seen here on 27 March 2023 at that time she had had a flare of her cough and was also having difficulties getting budesonide inhaler as the manufacturer discontinued it.  She was switched to Symbicort 160/4.5 and notes that this is very helpful.  She has also been noted to have mild pulmonary hypertension and this led to home sleep study which showed moderate sleep apnea.  She is currently using auto CPAP at 5 to 15 cm H2O of pressure.  She notes that she continues to do well with  this.  She feels that she gets restorative sleep.  At her prior visit we prescribed a new mask however she has had difficulties with this mask and has been fitted with a new one which has been ordered by her DME company.    Since her last visit she has not had any dyspnea on exertion, no wheezing, cough is rare.  She does not endorse any other symptomatology today.  Her CPAP compliance is stellar: She has 100% compliance average of 8 hours 7 minutes per night of use.  She is currently on AutoSet of 5 to 15 cm H2O residual AHI is 0.7.  CPAP pressures fluctuate between 7.5 to 13 cm H2O per night.  Aside from mild issues with flare of seasonal/allergic rhinitis she does not voice any other complaint today.  We discussed her PFTs from 27 May 2023 where normal with the exception of a very mild diffusion defect.  This is likely related to her mild radiation fibrosis from prior and her mild pulmonary hypertension.   DATA: 09/24/2019 2D echo: LVEF 60 to 65%, normal LV function, indeterminate LV diastolic parameters. Mildly elevated pulmonary artery systolic pressure. 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  Very mild cylindrical bronchiectasis in the lungs bilaterally with some mucus impaction right lower lobe.  Mediastinal adenopathy. 02/17/2020 PFT: Normal spirometry and lung volumes 04/25/2020 HST: Moderate obstructive sleep apnea with AHI of 15.4 and SpO2 low of 76%, patient placed on auto CPAP at 5 to 20 cm H2O 08/31/2021 2D echo: LVEF 60 to 65%, grade I DD, mildly elevated pulmonary  artery systolic pressure.  In essence, unchanged from prior 05/27/2023 PFTs: FEV1 2.15 L or 117% predicted, FVC 2.41 L or 98% predicted, FEV1/FVC 89% lung volumes were normal diffusion capacity mildly correcting by alveolar volume to 75%  Review of Systems A 10 point review of systems was performed and it is as noted above otherwise negative.   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/2023   Acute diverticulitis 03/21/2023   Abdominal pain, left lower quadrant 03/16/2023   Burning with urination 03/07/2023   Overweight (BMI 25.0-29.9) 03/05/2022   Elevated ferritin 03/23/2021   RUQ pain 02/23/2021   Decreased GFR 02/23/2021   Vitamin B 12 deficiency 02/23/2021   Leukopenia 02/23/2021   Bronchiectasis without complication (HCC) 01/26/2020   Aortic atherosclerosis (HCC) 12/23/2019   Atherosclerosis of coronary artery of native heart without angina pectoris 12/23/2019   Peripheral edema 12/22/2019   Chemotherapy-induced peripheral neuropathy (HCC) 10/29/2019   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   Anemia 10/15/2018   Port-A-Cath in place 09/29/2018   Diabetic retinopathy (HCC) 09/29/2018   Genetic testing 09/23/2018   Family history of leukemia 09/16/2018   Family history of breast cancer    Malignant neoplasm of upper-outer quadrant of left breast in female, 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/11/2018   Family history of breast cancer in sister 08/28/2018   Diverticular disease 10/17/2017   Primary osteoarthritis of right knee 03/07/2017   HSV-1 (herpes simplex virus 1) infection, 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   Vitamin D deficiency 10/11/2016   Skin rash 10/11/2016   Family history of thyroid disorder 04/04/2015   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   Counseling regarding end of life decision making 10/04/2014   OA (osteoarthritis) of neck 08/02/2014   History of duodenal ulcer 08/02/2014   Seasonal allergies 08/02/2014   Hypertension associated with diabetes (HCC) 08/02/2014   Hyperlipidemia associated with type 2 diabetes mellitus (HCC) 08/02/2014   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   History of joint replacement 03/01/2014   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) 11/07/2009    Social History   Tobacco Use   Smoking status: Never    Smokeless tobacco: Never  Substance Use Topics   Alcohol use: Not Currently    Alcohol/week: 0.0 standard drinks of alcohol    Comment: rarely    Allergies  Allergen Reactions   Erythromycin Anaphylaxis    REACTION: Rash, hives   Ciprofloxacin Rash   Daypro [Oxaprozin] Rash   Enalapril Maleate Nausea Only and Rash   Nsaids Nausea Only   Vioxx [Rofecoxib] Nausea Only    Current Meds  Medication Sig   alendronate (FOSAMAX) 70 MG tablet TAKE 1 TABLET(70 MG) BY MOUTH 1 TIME A WEEK WITH A FULL GLASS OF WATER AND ON AN EMPTY STOMACH   anastrozole (ARIMIDEX) 1 MG tablet TAKE 1 TABLET(1 MG) BY MOUTH DAILY   atorvastatin (LIPITOR) 20 MG tablet Take 1 tablet (20 mg total) by mouth daily.   budesonide-formoterol (SYMBICORT) 160-4.5 MCG/ACT inhaler Inhale 2 puffs into the lungs in the morning and at bedtime.   Calcium Carbonate-Vit D-Min (CALTRATE 600+D PLUS MINERALS) 600-800 MG-UNIT TABS Take 1 tablet by mouth in the morning and at bedtime.   clobetasol (TEMOVATE) 0.05 % external solution Apply 1 application topically 2 (two) times daily as needed (scalp). Reported on 08/16/2015   empagliflozin (JARDIANCE) 10 MG TABS tablet Take 1 tablet (10 mg total) by mouth daily before breakfast.  fenofibrate (TRICOR) 145 MG tablet Take 1 tablet (145 mg total) by mouth daily.   fluticasone (FLONASE) 50 MCG/ACT nasal spray SHAKE LIQUID AND USE 2 SPRAYS IN EACH NOSTRIL EVERY DAY   gabapentin (NEURONTIN) 300 MG capsule TAKE 1 CAPSULE(300 MG) BY MOUTH AT BEDTIME   glucose blood (FREESTYLE TEST STRIPS) test strip USE TO CHECK BLOOD SUGAR DAILY   loratadine (CLARITIN) 10 MG tablet Take 10 mg by mouth daily.   losartan-hydrochlorothiazide (HYZAAR) 100-25 MG tablet TAKE 1 TABLET BY MOUTH DAILY   metroNIDAZOLE (METROCREAM) 0.75 % cream Apply 1 Application topically 2 (two) times daily as needed.   Multiple Vitamins-Minerals (CENTRUM SILVER 50+WOMEN) TABS Take 1 tablet by mouth daily.   Polyethyl Glycol-Propyl  Glycol (SYSTANE OP) Apply to eye as needed.   Probiotic Product (PROBIOTIC DAILY PO) Take by mouth. Schiff Digestive Advantage   psyllium (METAMUCIL) 58.6 % powder Take 1 packet by mouth daily.   TRULICITY 0.75 MG/0.5ML SOPN Inject 0.75 mg as directed once a week.   valACYclovir (VALTREX) 500 MG tablet Take 1 tablet (500 mg total) by mouth 2 (two) times daily.    Immunization History  Administered Date(s) Administered   Fluad Quad(high Dose 65+) 04/30/2019, 05/12/2020, 04/06/2022, 04/19/2023   Hep A / Hep B 05/29/2009, 07/03/2009, 11/24/2009   Hepb-cpg 10/26/2021, 11/28/2021   Influenza, High Dose Seasonal PF 04/18/2014, 04/13/2015, 04/30/2018, 04/10/2021   Influenza, Seasonal, Injecte, Preservative Fre 06/26/2016   Influenza-Unspecified 05/17/2009, 04/02/2011, 05/20/2012, 06/02/2013, 07/01/2013, 04/18/2014, 04/13/2015, 06/27/2016, 04/11/2017   Moderna Covid-19 Vaccine Bivalent Booster 52yrs & up 04/06/2022   PFIZER Comirnaty(Gray Top)Covid-19 Tri-Sucrose Vaccine 11/29/2020   PFIZER(Purple Top)SARS-COV-2 Vaccination 10/06/2019, 10/27/2019, 04/24/2020   Pfizer Covid-19 Vaccine Bivalent Booster 55yrs & up 05/02/2021   Pfizer(Comirnaty)Fall Seasonal Vaccine 12 years and older 05/02/2023   Pneumococcal Conjugate-13 04/18/2014   Pneumococcal Polysaccharide-23 10/06/2015   Rsv, Bivalent, Protein Subunit Rsvpref,pf (Abrysvo) 06/04/2022   Td 05/17/2009, 05/12/2020   Typhoid Inactivated 05/17/2009   Unspecified SARS-COV-2 Vaccination 06/04/2022   Yellow Fever 05/17/2009   Zoster Recombinant(Shingrix) 06/07/2021, 08/11/2021   Zoster, Live 04/02/2011        Objective:     BP 122/72 (BP Location: Right Arm, Cuff Size: Normal)   Pulse 75   Temp (!) 97.5 F (36.4 C)   Ht 5\' 2"  (1.575 m)   Wt 165 lb 3.2 oz (74.9 kg)   LMP 08/26/1992 (Approximate)   SpO2 97%   BMI 30.22 kg/m   SpO2: 97 % O2 Device: None (Room air)  GENERAL: Awake, alert, fully ambulatory, no acute distress.  No  conversational dyspnea. HEAD: Normocephalic, atraumatic. EYES: Pupils equal, round, reactive to light.  No scleral icterus. MOUTH: Dentition intact.  No thrush.  Oral mucosa moist. NECK: Supple. No thyromegaly. Trachea midline. No JVD.  No adenopathy. PULMONARY: Excellent air entry bilaterally.  Lungs clear to auscultation bilaterally. CARDIOVASCULAR: S1 and S2. Regular rate and rhythm.  No rubs murmurs or gallops heard. GASTROINTESTINAL: Benign. MUSCULOSKELETAL: No joint deformity, no clubbing, no edema. NEUROLOGIC: Awake, alert, no focal deficits.  Speech is fluent.  No gait disturbance noted, fully ambulatory. SKIN: Intact,warm,dry. PSYCH: Mood and behavior appropriate   Assessment & Plan:     ICD-10-CM   1. Bronchiectasis without complication (HCC)  J47.9    No exacerbation Pulmonary secretions well-controlled    2. Reactive airways dysfunction syndrome (HCC)  J68.3    Continue Symbicort 160/4.5, 2 inhalations twice a day    3. Chronic cough  R05.3    Markedly improved  4. Pulmonary hypertension (HCC)  I27.20    Mild by echocardiography Repeat echo in a year    5. OSA on CPAP  G47.33    Patient has excellent compliance Continue auto CPAP 5 to 15 cm H2O    6. Malignant neoplasm of upper-outer quadrant of left breast in female, estrogen receptor positive (HCC)  C50.412    Z17.0    This issue adds complexity to her management She is status post mastectomy/radiation and chemotherapy Currently on anastrozole Follows with oncology      Patient will be seen in follow-up in 4 to 6 months time she is to contact us prior to that time should any new problems arise.  Gailen Shelter, MD Advanced Bronchoscopy [facility]    *This note was dictated using voice recognition software/Dragon.  Despite best efforts to proofread, errors can occur which can change the meaning. Any transcriptional errors that result from this process are unintentional and  may not be fully corrected at the time of dictation.

## 2023-06-06 DIAGNOSIS — M25552 Pain in left hip: Secondary | ICD-10-CM | POA: Diagnosis not present

## 2023-06-06 DIAGNOSIS — M7062 Trochanteric bursitis, left hip: Secondary | ICD-10-CM | POA: Diagnosis not present

## 2023-06-06 DIAGNOSIS — M1612 Unilateral primary osteoarthritis, left hip: Secondary | ICD-10-CM | POA: Diagnosis not present

## 2023-06-07 ENCOUNTER — Other Ambulatory Visit: Payer: Self-pay | Admitting: Family Medicine

## 2023-06-09 ENCOUNTER — Ambulatory Visit (INDEPENDENT_AMBULATORY_CARE_PROVIDER_SITE_OTHER): Payer: Medicare Other

## 2023-06-09 VITALS — Ht 62.0 in | Wt 162.0 lb

## 2023-06-09 DIAGNOSIS — Z0000 Encounter for general adult medical examination without abnormal findings: Secondary | ICD-10-CM | POA: Diagnosis not present

## 2023-06-09 NOTE — Progress Notes (Signed)
Subjective:   Katie [name] is a 77 y.o. female who presents for Medicare Annual (Subsequent) preventive examination.  Visit Complete: Virtual I connected with  Katie [name] on 06/09/23 by a audio enabled telemedicine application and verified that I am speaking with the correct person using two identifiers.  Patient Location: Home  Provider Location: Home Office  I discussed the limitations of evaluation and management by telemedicine. The patient expressed understanding and agreed to proceed.  Vital Signs: Because this visit was a virtual/telehealth visit, some criteria may be missing or patient reported. Any vitals not documented were not able to be obtained and vitals that have been documented are patient reported.  Patient Medicare AWV questionnaire was completed by the patient on 06/09/2023; I have confirmed that all information answered by patient is correct and no changes since this date.  Cardiac Risk Factors include: advanced age (>62men, >57 women);diabetes mellitus;dyslipidemia;hypertension     Objective:    Today's Vitals   06/09/23 0805  Weight: 162 lb (73.5 kg)  Height: 5\' 2"  (1.575 m)   Body mass index is 29.63 kg/m.     06/09/2023    8:09 AM 03/16/2023   11:38 AM 03/16/2023   10:22 AM 12/31/2022    6:34 AM 12/23/2022    1:20 PM 11/23/2021    1:38 PM 05/02/2020   11:20 AM  Advanced Directives  Does Patient Have a Medical Advance Directive? Yes Yes Yes No No Yes Yes  Type of Estate agent of [place];Living will Healthcare Power of [employer] Power of [employer] Power of [place];Living will Healthcare Power of [place];Living will  Copy of Healthcare Power of Attorney in Chart? No - copy requested     No - copy requested Yes - validated most recent copy scanned in chart (See row information)  Would patient like information on creating a medical advance directive?    No - Patient declined       Current Medications  (verified) Outpatient Encounter Medications as of 06/09/2023  Medication Sig   alendronate (FOSAMAX) 70 MG tablet TAKE 1 TABLET(70 MG) BY MOUTH 1 TIME A WEEK WITH A FULL GLASS OF WATER AND ON AN EMPTY STOMACH   anastrozole (ARIMIDEX) 1 MG tablet TAKE 1 TABLET(1 MG) BY MOUTH DAILY   atorvastatin (LIPITOR) 20 MG tablet Take 1 tablet (20 mg total) by mouth daily.   budesonide-formoterol (SYMBICORT) 160-4.5 MCG/ACT inhaler Inhale 2 puffs into the lungs in the morning and at bedtime.   Calcium Carbonate-Vit D-Min (CALTRATE 600+D PLUS MINERALS) 600-800 MG-UNIT TABS Take 1 tablet by mouth in the morning and at bedtime.   clobetasol (TEMOVATE) 0.05 % external solution Apply 1 application topically 2 (two) times daily as needed (scalp). Reported on 08/16/2015   empagliflozin (JARDIANCE) 10 MG TABS tablet Take 1 tablet (10 mg total) by mouth daily before breakfast.   fenofibrate (TRICOR) 145 MG tablet Take 1 tablet (145 mg total) by mouth daily.   fluticasone (FLONASE) 50 MCG/ACT nasal spray SHAKE LIQUID AND USE 2 SPRAYS IN EACH NOSTRIL EVERY DAY   gabapentin (NEURONTIN) 300 MG capsule TAKE 1 CAPSULE(300 MG) BY MOUTH AT BEDTIME   glucose blood (FREESTYLE TEST STRIPS) test strip USE TO CHECK BLOOD SUGAR DAILY   loratadine (CLARITIN) 10 MG tablet Take 10 mg by mouth daily.   losartan-hydrochlorothiazide (HYZAAR) 100-25 MG tablet TAKE 1 TABLET BY MOUTH DAILY   metroNIDAZOLE (METROCREAM) 0.75 % cream Apply 1 Application topically 2 (two) times daily as  needed.   Multiple Vitamins-Minerals (CENTRUM SILVER 50+WOMEN) TABS Take 1 tablet by mouth daily.   Polyethyl Glycol-Propyl Glycol (SYSTANE OP) Apply to eye as needed.   Probiotic Product (PROBIOTIC DAILY PO) Take by mouth. Schiff Digestive Advantage   psyllium (METAMUCIL) 58.6 % powder Take 1 packet by mouth daily.   TRULICITY 0.75 MG/0.5ML SOPN Inject 0.75 mg as directed once a week.   valACYclovir (VALTREX) 500 MG tablet Take 1 tablet (500 mg total) by  mouth 2 (two) times daily.   [DISCONTINUED] prochlorperazine (COMPAZINE) 10 MG tablet Take 1 tablet (10 mg total) by mouth every 6 (six) hours as needed (Nausea or vomiting).   No facility-administered encounter medications on file as of 06/09/2023.    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, small, not treating-watching , it is now stented   Blood transfusion without reported diagnosis    Cancer (HCC)    breast 08/2018   Coronary artery disease    Diverticulitis    DJD (degenerative joint disease)    DM type 2 (diabetes mellitus, type 2) (HCC)    Family history of breast cancer    Fibrocystic breast disease    GERD (gastroesophageal reflux disease)    History of chicken pox    Hypercholesteremia    Hypertension    Lichen planopilaris    Neuromuscular disorder (HCC)    Sleep apnea    cpap   Urinary incontinence    Vertigo 12/2019   1 episode   Past Surgical History:  Procedure Laterality Date   BREAST IMPLANT REMOVAL Bilateral 03/08/2019   Procedure: Removal of Bilateral Breast Implants;  Surgeon: Emelia Loron, MD;  Location: [facility] OR;  Service: General;  Laterality: Bilateral;   BREAST SURGERY  02/1975   Left Breast Biopsy-Fibrocystic Disease   BREAST SURGERY  10/1981   Bialteral Capsulectomy Breast Surgery   BREAST SURGERY  07/1998   Replace Bilateral Silicone Implants   BREAST SURGERY  01/1976   Bilater breast surgery to remove fibrocystic tissue and replace with silicone implants   CATARACT EXTRACTION W/PHACO Right 02/08/2020   Procedure: CATARACT EXTRACTION PHACO AND INTRAOCULAR LENS PLACEMENT (IOC) RIGHT DIABETIC;  Surgeon: Galen Manila, MD;  Location: [facility];  Service: Ophthalmology;  Laterality: Right;  3.55 0:33.7   CATARACT EXTRACTION W/PHACO Left 02/29/2020    Procedure: CATARACT EXTRACTION PHACO AND INTRAOCULAR LENS PLACEMENT (IOC) LEFT DIABETIC 2.93  00:27.1;  Surgeon: Galen Manila, MD;  Location: [facility];  Service: Ophthalmology;  Laterality: Left;  Diabetic - oral meds   CHOLECYSTECTOMY N/A 07/08/2016   Procedure: LAPAROSCOPIC CHOLECYSTECTOMY;  Surgeon: Ricarda Frame, MD;  Location: [facility] ORS;  Service: General;  Laterality: N/A;   COLONOSCOPY WITH PROPOFOL N/A 11/07/2017   Procedure: COLONOSCOPY WITH PROPOFOL;  Surgeon: Wyline Mood, MD;  Location: [facility];  Service: Gastroenterology;  Laterality: N/A;   DILATION AND CURETTAGE OF UTERUS  01/1994   ESOPHAGOGASTRODUODENOSCOPY (EGD) WITH PROPOFOL N/A 12/05/2016   Procedure: ESOPHAGOGASTRODUODENOSCOPY (EGD) WITH PROPOFOL;  Surgeon: Wyline Mood, MD;  Location: [facility] ENDOSCOPY;  Service: Endoscopy;  Laterality: N/A;   FINGER SURGERY  2001 & 2003   Trigger Finger   FOOT SURGERY  1980   To Relieve Pinched Nerve   FOOT SURGERY  07/2008   Left Foot-Plantar Fasciitis   JOINT REPLACEMENT  04/19/2013   Hip  Replacement-Right   MASTECTOMY     double   MASTECTOMY W/ SENTINEL NODE BIOPSY Bilateral 03/08/2019   Procedure: BILATERAL MASTECTOMIES WITH LEFT AXILLARY SENTINEL LYMPH NODE BIOPSY AND BLUE DYE INJECTION;  Surgeon: Emelia Loron, MD;  Location: [facility] OR;  Service: General;  Laterality: Bilateral;   PARTIAL KNEE ARTHROPLASTY Right 12/31/2022   Procedure: RIGHT PARTIAL KNEE ARTHROPLASTY;  Surgeon: Christena Flake, MD;  Location: [facility] ORS;  Service: Orthopedics;  Laterality: Right;   PLACEMENT OF BREAST IMPLANTS  12/26/2004   Replace Failed Implants   REPLACEMENT TOTAL KNEE Left    2019-   RHINOPLASTY  03/1964   To Correct Deviated Septum   Family History  Problem Relation Age of Onset   Arthritis Mother    Hypertension Mother    Heart disease Father    Diabetes Father    Heart attack Father    Breast cancer Sister 35       d. 79   Hypertension Brother    Leukemia Brother     Coronary artery disease Brother    Heart attack Brother    Social History   Socioeconomic History   Marital status: Married    Spouse name: Debroah Loop   Number of children: 1   Years of education: Not on file   Highest education level: Master's degree (e.g., MA, MS, MEng, MEd, MSW, MBA)  Occupational History   Occupation: Retired  Tobacco Use   Smoking status: Never   Smokeless tobacco: Never  Vaping Use   Vaping status: Never Used  Substance and Sexual Activity   Alcohol use: Not Currently    Alcohol/week: 0.0 standard drinks of alcohol    Comment: rarely   Drug use: No   Sexual activity: Not Currently    Partners: Male  Other Topics Concern   Not on file  Social History Narrative   Married for 37 years.    She has one child and one stepston.      Social Determinants of Health   Financial Resource Strain: Low Risk  (06/09/2023)   Overall Financial Resource Strain (CARDIA)    Difficulty of Paying Living Expenses: Not hard at all  Food Insecurity: No Food Insecurity (06/09/2023)   Hunger Vital Sign    Worried About Running Out of Food in the Last Year: Never true    Ran Out of Food in the Last Year: Never true  Transportation Needs: No Transportation Needs (06/09/2023)   PRAPARE - Administrator, Civil Service (Medical): No    Lack of Transportation (Non-Medical): No  Physical Activity: Insufficiently Active (06/09/2023)   Exercise Vital Sign    Days of Exercise per Week: 3 days    Minutes of Exercise per Session: 30 min  Stress: No Stress Concern Present (06/09/2023)   [employer] of Occupational Health - Occupational Stress Questionnaire    Feeling of Stress : Not at all  Social Connections: Socially Integrated (06/09/2023)   Social Connection and Isolation Panel [NHANES]    Frequency of Communication with Friends and Family: More than three times a week    Frequency of Social Gatherings with Friends and Family: More than three times a week     Attends Religious Services: More than 4 times per year    Active Member of [employer] or Organizations: Yes    Attends Engineer, structural: More than 4 times per year    Marital Status: Married    Tobacco Counseling Counseling given: Not Answered   Clinical  Intake:  Pre-visit preparation completed: Yes  Pain : No/denies pain     Nutritional Risks: None Diabetes: Yes CBG done?: No Did pt. bring in CBG monitor from home?: No  How often do you need to have someone help you when you read instructions, pamphlets, or other written materials from your doctor or pharmacy?: 1 - Never  Interpreter Needed?: No  Information entered by :: Renie Ora, LPN   Activities of Daily Living    06/09/2023    8:09 AM 06/06/2023    5:50 PM  In your present state of health, do you have any difficulty performing the following activities:  Hearing? 0 0  Vision? 0 0  Difficulty concentrating or making decisions? 0 0  Walking or climbing stairs? 0 0  Dressing or bathing? 0 0  Doing errands, shopping? 0 0  Preparing Food and eating ? N N  Using the Toilet? N N  In the past six months, have you accidently leaked urine? N Y  Do you have problems with loss of bowel control? N N  Managing your Medications? N N  Managing your Finances? N N  Housekeeping or managing your Housekeeping? N N    Patient Care Team: [name], MD as PCP - General (Family Medicine) Debbe Odea, MD as PCP - Cardiology (Cardiology) Galen Manila, MD as Referring Physician (Ophthalmology) Debbrah Alar, MD as Consulting Physician (Dermatology) Ricarda Frame, MD as Consulting Physician (General Surgery) Tobe Sos, MD as Consulting Physician (Dentistry) Glenna Fellows, MD (Inactive) as Consulting Physician (General Surgery) Serena Croissant, MD as Consulting Physician (Hematology and Oncology) Lonie Peak, MD as Attending Physician (Radiation Oncology) Emelia Loron, MD as  Consulting Physician (General Surgery)  Indicate any recent Medical Services you may have received from other than [facility] providers in the past year (date may be approximate).     Assessment:   This is a routine wellness examination for Nanda.  Hearing/Vision screen Vision Screening - Comments:: Wears rx glasses - up to date with routine eye exams with  Dr.Portfillio   Goals Addressed             This Visit's Progress    DIET - INCREASE WATER INTAKE         Depression Screen    06/09/2023    8:07 AM 12/06/2022    8:23 AM 06/06/2022   10:45 AM 11/23/2021    1:39 PM 05/24/2021   11:22 AM 05/02/2020   11:21 AM 01/06/2019    9:19 AM  PHQ 2/9 Scores  PHQ - 2 Score 0 0 0 0 0 0 0  PHQ- 9 Score      0 0    Fall Risk    06/09/2023    8:06 AM 06/06/2023    5:50 PM 12/06/2022    8:23 AM 06/06/2022   10:44 AM 11/23/2021    1:39 PM  Fall Risk   Falls in the past year? 0 0 0 1 0  Number falls in past yr: 0 0 0 0   Injury with Fall? 0  1 0   Risk for fall due to : No Fall Risks  History of fall(s)    Follow up Falls prevention discussed  Falls evaluation completed      MEDICARE RISK AT HOME: Medicare Risk at Home Any stairs in or around the home?: Yes If so, are there any without handrails?: No Home free of loose throw rugs in walkways, pet beds, electrical cords, etc?: Yes Adequate lighting  in your home to reduce risk of falls?: Yes Life alert?: No Use of a cane, walker or w/c?: No Grab bars in the bathroom?: Yes Shower chair or bench in shower?: Yes Elevated toilet seat or a handicapped toilet?: Yes  TIMED UP AND GO:  Was the test performed?  No    Cognitive Function:    05/02/2020   11:22 AM 01/06/2019    9:19 AM 10/13/2017    8:15 AM 10/08/2016    9:07 AM  MMSE - Mini Mental State Exam  Orientation to time 5 5 5 5   Orientation to Place 5 5 5 5   Registration 3 3 3 3   Attention/ Calculation 5 0 0 0  Recall 3 3 3 3   Language- name 2 objects  0 0 0  Language-  repeat 1 1 1 1   Language- follow 3 step command  0 3 3  Language- read & follow direction  0 0 0  Write a sentence  0 0 0  Copy design  0 0 0  Total score  17 20 20         06/09/2023    8:09 AM  6CIT Screen  What Year? 0 points  What month? 0 points  What time? 0 points  Count back from 20 0 points  Months in reverse 0 points  Repeat phrase 0 points  Total Score 0 points    Immunizations Immunization History  Administered Date(s) Administered   Fluad Quad(high Dose 65+) 04/30/2019, 05/12/2020, 04/06/2022, 04/19/2023   Hep A / Hep B 05/29/2009, 07/03/2009, 11/24/2009   Hepb-cpg 10/26/2021, 11/28/2021   Influenza, High Dose Seasonal PF 04/18/2014, 04/13/2015, 04/30/2018, 04/10/2021   Influenza, Seasonal, Injecte, Preservative Fre 06/26/2016   Influenza-Unspecified 05/17/2009, 04/02/2011, 05/20/2012, 06/02/2013, 07/01/2013, 04/18/2014, 04/13/2015, 06/27/2016, 04/11/2017   Moderna Covid-19 Vaccine Bivalent Booster 54yrs & up 04/06/2022   PFIZER Comirnaty(Gray Top)Covid-19 Tri-Sucrose Vaccine 11/29/2020   PFIZER(Purple Top)SARS-COV-2 Vaccination 10/06/2019, 10/27/2019, 04/24/2020   Pfizer Covid-19 Vaccine Bivalent Booster 52yrs & up 05/02/2021   Pfizer(Comirnaty)Fall Seasonal Vaccine 12 years and older 05/02/2023   Pneumococcal Conjugate-13 04/18/2014   Pneumococcal Polysaccharide-23 10/06/2015   Rsv, Bivalent, Protein Subunit Rsvpref,pf (Abrysvo) 06/04/2022   Td 05/17/2009, 05/12/2020   Typhoid Inactivated 05/17/2009   Unspecified SARS-COV-2 Vaccination 06/04/2022   Yellow Fever 05/17/2009   Zoster Recombinant(Shingrix) 06/07/2021, 08/11/2021   Zoster, Live 04/02/2011    TDAP status: Up to date  Flu Vaccine status: Up to date  Pneumococcal vaccine status: Up to date  Covid-19 vaccine status: Completed vaccines  Qualifies for Shingles Vaccine? Yes   Zostavax completed Yes   Shingrix Completed?: Yes  Screening Tests Health Maintenance  Topic Date Due   FOOT EXAM   05/28/2023   COVID-19 Vaccine (9 - 2023-24 season) 06/27/2023   OPHTHALMOLOGY EXAM  11/18/2023   HEMOGLOBIN A1C  12/02/2023   Diabetic kidney evaluation - eGFR measurement  06/02/2024   Diabetic kidney evaluation - Urine ACR  06/02/2024   Medicare Annual Wellness (AWV)  06/08/2024   DEXA SCAN  10/04/2024   Colonoscopy  04/14/2028   DTaP/Tdap/Td (3 - Tdap) 05/12/2030   Pneumonia Vaccine 78+ Years old  Completed   INFLUENZA VACCINE  Completed   Hepatitis C Screening  Completed   Zoster Vaccines- Shingrix  Completed   HPV VACCINES  Aged Out   MAMMOGRAM  Discontinued    Health Maintenance  Health Maintenance Due  Topic Date Due   FOOT EXAM  05/28/2023    Colorectal cancer screening: No longer required.  Mammogram status: No longer required due to age.  Bone Density status: Completed 10/04/2022. Results reflect: Bone density results: OSTEOPOROSIS. Repeat every 2 years.  Lung Cancer Screening: (Low Dose CT Chest recommended if Age 69-80 years, 20 pack-year currently smoking OR have quit w/in 15years.) does not qualify.   Lung Cancer Screening Referral: n/a  Additional Screening:  Hepatitis C Screening: does not qualify; Completed 10/03/2015  Vision Screening: Recommended annual ophthalmology exams for early detection of glaucoma and other disorders of the eye. Is the patient up to date with their annual eye exam?  Yes  Who is the provider or what is the name of the office in which the patient attends annual eye exams? Dr.Portfillo If pt is not established with a provider, would they like to be referred to a provider to establish care? No .   Dental Screening: Recommended annual dental exams for proper oral hygiene  Diabetic Foot Exam: Diabetic Foot Exam: Overdue, Pt has been advised about the importance in completing this exam. Pt is scheduled for diabetic foot exam on next office visit .  Community Resource Referral / Chronic Care Management: CRR required this visit?  No    CCM required this visit?  No     Plan:     I have personally reviewed and noted the following in the patient's chart:   Medical and social history Use of alcohol, tobacco or illicit drugs  Current medications and supplements including opioid prescriptions. Patient is not currently taking opioid prescriptions. Functional ability and status Nutritional status Physical activity Advanced directives List of other physicians Hospitalizations, surgeries, and ER visits in previous 12 months Vitals Screenings to include cognitive, depression, and falls Referrals and appointments  In addition, I have reviewed and discussed with patient certain preventive protocols, quality metrics, and best practice recommendations. A written personalized care plan for preventive services as well as general preventive health recommendations were provided to patient.     Lorrene Reid, LPN   [id number]/05/[id number]   After Visit Summary: (MyChart) Due to this being a telephonic visit, the after visit summary with patients personalized plan was offered to patient via MyChart   Nurse Notes: none

## 2023-06-09 NOTE — Patient Instructions (Signed)
Katie [name] , Thank you for taking time to come for your Medicare Wellness Visit. I appreciate your ongoing commitment to your health goals. Please review the following plan we discussed and let me know if I can assist you in the future.   Referrals/Orders/Follow-Ups/Clinician Recommendations: Aim for 30 minutes of exercise or brisk walking, 6-8 glasses of water, and 5 servings of fruits and vegetables each day.   This is a list of the screening recommended for you and due dates:  Health Maintenance  Topic Date Due   Complete foot exam   05/28/2023   COVID-19 Vaccine (9 - 2023-24 season) 06/27/2023   Eye exam for diabetics  11/18/2023   Hemoglobin A1C  12/02/2023   Yearly kidney function blood test for diabetes  06/02/2024   Yearly kidney health urinalysis for diabetes  06/02/2024   Medicare Annual Wellness Visit  06/08/2024   DEXA scan (bone density measurement)  10/04/2024   Colon Cancer Screening  04/14/2028   DTaP/Tdap/Td vaccine (3 - Tdap) 05/12/2030   Pneumonia Vaccine  Completed   Flu Shot  Completed   Hepatitis C Screening  Completed   Zoster (Shingles) Vaccine  Completed   HPV Vaccine  Aged Out   Mammogram  Discontinued    Advanced directives: (In Chart) A copy of your advanced directives are scanned into your chart should your provider ever need it.  Next Medicare Annual Wellness Visit scheduled for next year: Yes  Insert Preventive Care attachment Insert FALL PREVENTION attachment if needed

## 2023-06-10 ENCOUNTER — Ambulatory Visit: Payer: Medicare Other | Admitting: Family Medicine

## 2023-06-10 ENCOUNTER — Encounter: Payer: Self-pay | Admitting: Family Medicine

## 2023-06-10 VITALS — BP 120/60 | HR 78 | Temp 98.2°F | Ht 62.25 in | Wt 163.5 lb

## 2023-06-10 DIAGNOSIS — J479 Bronchiectasis, uncomplicated: Secondary | ICD-10-CM

## 2023-06-10 DIAGNOSIS — E1159 Type 2 diabetes mellitus with other circulatory complications: Secondary | ICD-10-CM

## 2023-06-10 DIAGNOSIS — I720 Aneurysm of carotid artery: Secondary | ICD-10-CM

## 2023-06-10 DIAGNOSIS — C50412 Malignant neoplasm of upper-outer quadrant of left female breast: Secondary | ICD-10-CM

## 2023-06-10 DIAGNOSIS — E113293 Type 2 diabetes mellitus with mild nonproliferative diabetic retinopathy without macular edema, bilateral: Secondary | ICD-10-CM | POA: Diagnosis not present

## 2023-06-10 DIAGNOSIS — E785 Hyperlipidemia, unspecified: Secondary | ICD-10-CM | POA: Diagnosis not present

## 2023-06-10 DIAGNOSIS — Z170 Estrogen receptor positive status [ER+]: Secondary | ICD-10-CM | POA: Diagnosis not present

## 2023-06-10 DIAGNOSIS — I152 Hypertension secondary to endocrine disorders: Secondary | ICD-10-CM

## 2023-06-10 DIAGNOSIS — J683 Other acute and subacute respiratory conditions due to chemicals, gases, fumes and vapors: Secondary | ICD-10-CM

## 2023-06-10 DIAGNOSIS — I700 Atherosclerosis of aorta: Secondary | ICD-10-CM

## 2023-06-10 DIAGNOSIS — E1169 Type 2 diabetes mellitus with other specified complication: Secondary | ICD-10-CM | POA: Diagnosis not present

## 2023-06-10 LAB — HM DIABETES FOOT EXAM

## 2023-06-10 MED ORDER — TRULICITY 1.5 MG/0.5ML ~~LOC~~ SOAJ: 1.5000 mg | SUBCUTANEOUS | 3 refills | Status: DC

## 2023-06-10 NOTE — Assessment & Plan Note (Signed)
Stable, chronic.  Continue current medication.   Well-controlled on losartan hydrochlorothiazide 100/25 mg p.o. daily.

## 2023-06-10 NOTE — Assessment & Plan Note (Signed)
Associated with retinopathy Interval worsening of diabetes

## 2023-06-10 NOTE — Assessment & Plan Note (Signed)
Reviewed labs from cardiology.. LDL at goal  trig slightly high. Stable, chronic.  Continue current medication.   Atorvastatin 20 mg daily. Fenofibrate 145 m,g daily

## 2023-06-10 NOTE — Assessment & Plan Note (Signed)
Associated with diabetes. Yearly ophthalmology exam completed.

## 2023-06-10 NOTE — Patient Instructions (Addendum)
Try again to get 1.5 mg of Trulicity for better DM control.  Decrease calcium to one tablet a day.  Work on Field seismologist intake.

## 2023-06-10 NOTE — Assessment & Plan Note (Signed)
Active treatment by oncology with Arimidex on year 3

## 2023-06-10 NOTE — Assessment & Plan Note (Signed)
On statin and aspirin anticoagulation.

## 2023-06-10 NOTE — Assessment & Plan Note (Signed)
Secondary to chemo. Followed by pulmonary.

## 2023-06-10 NOTE — Assessment & Plan Note (Signed)
Followed by  Neurosurgery.

## 2023-06-10 NOTE — Assessment & Plan Note (Signed)
Chronic, followed by pulmonary.

## 2023-06-10 NOTE — Progress Notes (Signed)
Patient ID: Katie [name], female    DOB: 1946-02-08, 77 y.o.   MRN: [id number]  This visit was conducted in person.  BP 120/60 (BP Location: Right Arm, Patient Position: Sitting, Cuff Size: Large)   Pulse 78   Temp 98.2 F (36.8 C) (Temporal)   Ht 5' 2.25" (1.581 m)   Wt 163 lb 8 oz (74.2 kg)   LMP 08/26/1992 (Approximate)   SpO2 97%   BMI 29.66 kg/m    CC:  Chief Complaint  Patient presents with   Annual Exam    Part 2    Subjective:   HPI: Katie [name] is a 77 y.o. female presenting on 06/10/2023 for Annual Exam (Part 2)  The patient presents for annual review of chronic health problems. He/She also has the following acute concerns today:  Has been seeing Dr. Binnie Rail PA for left knee and hip pain.[name] going to PT.. having improvement in symptoms. Noted some leg length discrepancy.[name] doing with the lift as well. HAS follow up with Dr. Joice Lofts in Nov.  Diabetes: She is on Trulicity 1.5 mg weekly with previously good control of diabetes, now with interval increase in A1c .[name] Reduced to 0.75 mg weekly  as she cannot get the higher dose.  Has been eating out more lately  Also on Jardiance  10 mg daily  Lab Results  Component Value Date   HGBA1C 7.2 (H) 06/03/2023  Using medications without difficulties: none Hypoglycemic episodes: none Hyperglycemic episodes: none Feet problems: no ulcers  Blood Sugars averaging: FBS 130-140 eye exam within last year: yes  Wt Readings from Last 3 Encounters:  06/10/23 163 lb 8 oz (74.2 kg)  06/09/23 162 lb (73.5 kg)  06/03/23 165 lb 3.2 oz (74.9 kg)   Hypertension:  Well-controlled on losartan hydrochlorothiazide 100/25 mg p.o. daily BP Readings from Last 3 Encounters:  06/10/23 120/60  06/03/23 122/72  04/15/23 (!) 102/51  Using medication without problems or lightheadedness:  none Chest pain with exertion: none Edema: none Short of breath:  stable... see pulmonary note Average home BPs: Other issues:  Elevated  Cholesterol: Cholesterol almost at goal on atorvastatin 20 mg p.o. daily and fenofibrate 145 mg p.o. daily Lab Results  Component Value Date   CHOL 176 06/03/2023   HDL 44.30 06/03/2023   LDLCALC 74 06/03/2023   LDLDIRECT 57.0 05/02/2020   TRIG 286.0 (H) 06/03/2023   CHOLHDL 4 06/03/2023  Using medications without problems: Muscle aches:  Diet compliance: heart healthy Exercise: 3 times a week Other complaints:   Breast cancer active treatment on Arimidex x 4 years, plan 5-7  Osteoporosis treated on Fosamax 70 mg weekly, calcium and vitamin D.   Followed by Dr. Marcos Eke [facility] for reactive airways and OSA On CPAP and using Symbicort  inhaler.  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 Interim medical history since our last visit reviewed. Allergies and medications reviewed and updated. Outpatient Medications Prior to Visit  Medication Sig Dispense Refill   alendronate (FOSAMAX) 70 MG tablet TAKE 1 TABLET(70 MG) BY MOUTH 1 TIME A WEEK WITH A FULL GLASS OF WATER AND ON AN EMPTY STOMACH 12 tablet 3   anastrozole (ARIMIDEX) 1 MG tablet TAKE 1 TABLET(1 MG) BY MOUTH DAILY 90 tablet 3   atorvastatin (LIPITOR) 20 MG tablet Take 1 tablet (20 mg total) by mouth daily. 90 tablet 2   budesonide-formoterol (SYMBICORT) 160-4.5 MCG/ACT inhaler Inhale 2  puffs into the lungs in the morning and at bedtime. 10.2 g 12   Calcium Carbonate-Vit D-Min (CALTRATE 600+D PLUS MINERALS) 600-800 MG-UNIT TABS Take 1 tablet by mouth in the morning and at bedtime.     clobetasol (TEMOVATE) 0.05 % external solution Apply 1 application topically 2 (two) times daily as needed (scalp). Reported on 08/16/2015     empagliflozin (JARDIANCE) 10 MG TABS tablet Take 1 tablet (10 mg total) by mouth daily before breakfast. 30 tablet 11   fenofibrate (TRICOR) 145 MG tablet Take 1 tablet (145 mg total) by mouth daily. 90  tablet 2   fluticasone (FLONASE) 50 MCG/ACT nasal spray SHAKE LIQUID AND USE 2 SPRAYS IN EACH NOSTRIL EVERY DAY 16 g 11   gabapentin (NEURONTIN) 300 MG capsule TAKE 1 CAPSULE(300 MG) BY MOUTH AT BEDTIME 90 capsule 3   glucose blood (FREESTYLE TEST STRIPS) test strip USE TO CHECK BLOOD SUGAR DAILY 50 strip 11   loratadine (CLARITIN) 10 MG tablet Take 10 mg by mouth daily.     losartan-hydrochlorothiazide (HYZAAR) 100-25 MG tablet TAKE 1 TABLET BY MOUTH DAILY 90 tablet 1   metroNIDAZOLE (METROCREAM) 0.75 % cream Apply 1 Application topically 2 (two) times daily as needed.     Multiple Vitamins-Minerals (CENTRUM SILVER 50+WOMEN) TABS Take 1 tablet by mouth daily.     Polyethyl Glycol-Propyl Glycol (SYSTANE OP) Apply to eye as needed.     Probiotic Product (PROBIOTIC DAILY PO) Take by mouth. Schiff Digestive Advantage     psyllium (METAMUCIL) 58.6 % powder Take 1 packet by mouth daily.     valACYclovir (VALTREX) 500 MG tablet Take 1 tablet (500 mg total) by mouth 2 (two) times daily.     TRULICITY 0.75 MG/0.5ML SOPN Inject 0.75 mg as directed once a week. 2 mL 5   No facility-administered medications prior to visit.     Per HPI unless specifically indicated in ROS section below Review of Systems  Constitutional:  Negative for fatigue and fever.  HENT:  Negative for congestion.   Eyes:  Negative for pain.  Respiratory:  Negative for cough and shortness of breath.   Cardiovascular:  Negative for chest pain, palpitations and leg swelling.  Gastrointestinal:  Negative for abdominal pain.  Genitourinary:  Negative for dysuria and vaginal bleeding.  Musculoskeletal:  Negative for back pain.  Neurological:  Negative for syncope, light-headedness and headaches.  Psychiatric/Behavioral:  Negative for dysphoric mood.    Objective:  BP 120/60 (BP Location: Right Arm, Patient Position: Sitting, Cuff Size: Large)   Pulse 78   Temp 98.2 F (36.8 C) (Temporal)   Ht 5' 2.25" (1.581 m)   Wt 163 lb 8  oz (74.2 kg)   LMP 08/26/1992 (Approximate)   SpO2 97%   BMI 29.66 kg/m   Wt Readings from Last 3 Encounters:  06/10/23 163 lb 8 oz (74.2 kg)  06/09/23 162 lb (73.5 kg)  06/03/23 165 lb 3.2 oz (74.9 kg)      Physical Exam Vitals and nursing note reviewed.  Constitutional:      General: She is not in acute distress.    Appearance: Normal appearance. She is well-developed. She is not ill-appearing or toxic-appearing.  HENT:     Head: Normocephalic.     Right Ear: Hearing, tympanic membrane, ear canal and external ear normal.     Left Ear: Hearing, tympanic membrane, ear canal and external ear normal.     Nose: Nose normal.  Eyes:     General: Lids  are normal. Lids are everted, no foreign bodies appreciated.     Conjunctiva/sclera: Conjunctivae normal.     Pupils: Pupils are equal, round, and reactive to light.  Neck:     Thyroid: No thyroid mass or thyromegaly.     Vascular: No carotid bruit.     Trachea: Trachea normal.  Cardiovascular:     Rate and Rhythm: Normal rate and regular rhythm.     Heart sounds: Normal heart sounds, S1 normal and S2 normal. No murmur heard.    No gallop.  Pulmonary:     Effort: Pulmonary effort is normal. No respiratory distress.     Breath sounds: Normal breath sounds. No wheezing, rhonchi or rales.  Abdominal:     General: Bowel sounds are normal. There is no distension or abdominal bruit.     Palpations: Abdomen is soft. There is no fluid wave or mass.     Tenderness: There is no abdominal tenderness. There is no guarding or rebound.     Hernia: No hernia is present.  Musculoskeletal:     Cervical back: Normal range of motion and neck supple.  Lymphadenopathy:     Cervical: No cervical adenopathy.  Skin:    General: Skin is warm and dry.     Findings: No rash.  Neurological:     Mental Status: She is alert.     Cranial Nerves: No cranial nerve deficit.     Sensory: No sensory deficit.  Psychiatric:        Mood and Affect: Mood is not  anxious or depressed.        Speech: Speech normal.        Behavior: Behavior normal. Behavior is cooperative.        Judgment: Judgment normal.       Diabetic foot exam: Normal inspection No skin breakdown   No calluses  Normal DP pulses Normal sensation to light touch and monofilament Nails normal  Results for orders placed or performed in visit on 06/10/23  HM DIABETES FOOT EXAM  Result Value Ref Range   HM Diabetic Foot Exam done      COVID 19 screen:  No recent travel or known exposure to COVID19 The patient denies respiratory symptoms of COVID 19 at this time. The importance of social distancing was discussed today.   Assessment and Plan   The patient's preventative maintenance and recommended screening tests for an annual wellness exam were reviewed in full today. Brought up to date unless services declined.  Counselled on the importance of diet, exercise, and its role in overall health and mortality. The patient's FH and SH was reviewed, including their home life, tobacco status, and drug and alcohol status.   Last PAP 09/2014 nml, neg HPV, no further indicated. DVE:  no family history of ovarian or uterine cancer  No further mammogram after bilateral mastectomy. 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  On fosamax 70 mg weekly, Also on Arimidex which can cause bone loss.  Repeat stable oin 09/2022 Vaccines: uptodate flu,Td, uptodate with PNA and COVID series x 5, shingrix HAD RSVlast year Colon: Dr. Leonides Schanz, 03/2023, no further indicated. Hep C: neg Nonsmoker    Problem List Items Addressed This Visit     Aortic atherosclerosis (HCC)    On statin and aspirin anticoagulation.      Bronchiectasis without complication (HCC)    Secondary to chemo. Followed by pulmonary.      Carotid  artery aneurysm (HCC)    Followed by  Neurosurgery.      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 Yearly ophthalmology exam completed.      Relevant Medications   Dulaglutide (TRULICITY) 1.5 MG/0.5ML SOPN   Hyperlipidemia associated with type 2 diabetes mellitus (HCC)    Reviewed labs from cardiology.. LDL at goal  trig slightly high. Stable, chronic.  Continue current medication.   Atorvastatin 20 mg daily. Fenofibrate 145 m,g daily      Relevant Medications   Dulaglutide (TRULICITY) 1.5 MG/0.5ML SOPN   Hypertension associated with diabetes (HCC) (Chronic)    Stable, chronic.  Continue current medication.   Well-controlled on losartan hydrochlorothiazide 100/25 mg p.o. daily.        Relevant Medications   Dulaglutide (TRULICITY) 1.5 MG/0.5ML SOPN   Malignant neoplasm of upper-outer quadrant of left breast in female, estrogen receptor positive (HCC)    Active treatment by oncology with Arimidex on year 3      Reactive airways dysfunction syndrome (HCC)    Chronic, followed by pulmonary.       Orders Placed This Encounter  Procedures   Hemoglobin A1c    Standing Status:   Future    Standing Expiration Date:   09/08/2023   Lipid panel    Standing Status:   Future    Standing Expiration Date:   09/08/2023   Comprehensive metabolic panel    Standing Status:   Future    Standing Expiration Date:   09/08/2023   HM DIABETES FOOT EXAM    This external order was created through the Results Console.     Kerby Nora, MD

## 2023-06-11 MED ORDER — DEXCOM G7 SENSOR MISC: 11 refills | Status: DC

## 2023-06-11 NOTE — Telephone Encounter (Signed)
Will need to send in Rx for Dexcom before PA can be completed.

## 2023-06-11 NOTE — Telephone Encounter (Signed)
Dexcom order placed.

## 2023-06-12 NOTE — Telephone Encounter (Signed)
Please submit PA for Dexcom.

## 2023-06-18 ENCOUNTER — Telehealth: Payer: Self-pay

## 2023-06-18 NOTE — Telephone Encounter (Signed)
Pharmacy Patient Advocate Encounter   Received notification from Patient Advice Request messages that prior authorization for Dexcom G7 Sensor is required/requested.   Insurance verification completed.   The patient is insured through [employer] [facility] .   Per test claim: PA required; PA submitted to [employer] [facility] via CoverMyMeds Key/confirmation #/EOC [facility] Status is pending

## 2023-06-19 ENCOUNTER — Encounter: Payer: Self-pay | Admitting: *Deleted

## 2023-06-19 DIAGNOSIS — M62838 Other muscle spasm: Secondary | ICD-10-CM | POA: Diagnosis not present

## 2023-06-19 DIAGNOSIS — M25561 Pain in right knee: Secondary | ICD-10-CM | POA: Diagnosis not present

## 2023-06-19 NOTE — Telephone Encounter (Signed)
Pharmacy Patient Advocate Encounter  Received notification from [employer] [facility] that Prior Authorization for Dexcom G7 Sensor has been DENIED.  Full denial letter will be uploaded to the media tab. See denial reason below.   PA #/Case ID/Reference #: [id number]   DENIAL REASON: Your plan only covers this product if you are using an intensive insulin regimen.

## 2023-06-21 ENCOUNTER — Other Ambulatory Visit: Payer: Self-pay | Admitting: Family Medicine

## 2023-06-25 DIAGNOSIS — M62838 Other muscle spasm: Secondary | ICD-10-CM | POA: Diagnosis not present

## 2023-06-25 DIAGNOSIS — M25561 Pain in right knee: Secondary | ICD-10-CM | POA: Diagnosis not present

## 2023-06-26 ENCOUNTER — Other Ambulatory Visit: Payer: Self-pay | Admitting: Hematology and Oncology

## 2023-06-27 DIAGNOSIS — M62838 Other muscle spasm: Secondary | ICD-10-CM | POA: Diagnosis not present

## 2023-06-27 DIAGNOSIS — M25561 Pain in right knee: Secondary | ICD-10-CM | POA: Diagnosis not present

## 2023-07-02 DIAGNOSIS — M62838 Other muscle spasm: Secondary | ICD-10-CM | POA: Diagnosis not present

## 2023-07-02 DIAGNOSIS — M25561 Pain in right knee: Secondary | ICD-10-CM | POA: Diagnosis not present

## 2023-07-04 ENCOUNTER — Telehealth: Payer: Medicare Other | Admitting: Family Medicine

## 2023-07-04 ENCOUNTER — Telehealth: Payer: Self-pay | Admitting: Pulmonary Disease

## 2023-07-04 MED ORDER — MOLNUPIRAVIR EUA 200MG CAPSULE: 4.0000 | ORAL_CAPSULE | Freq: Two times a day (BID) | ORAL | 0 refills | Status: AC

## 2023-07-04 NOTE — Telephone Encounter (Signed)
Patient is aware of recommendations and voiced her understanding.  Molnupiravir sent to preferred pharmacy. Nothing further needed.

## 2023-07-04 NOTE — Telephone Encounter (Addendum)
Spoke to patient.  She stated that she tested positive for covid this morning. She was like Rx for antiviral.  C/o sore throat, prod cough with white sputum and overall just feels unwell. Sx started yesterday. SOB is baseline.  Denied f/c/s or additional sx. She does not wear supplemental oxygen. Spo2 maintaining around 96%. She is using Symbicort BID. She does not have albuterol.  Dr. Jayme Cloud, please advise. Thanks

## 2023-07-04 NOTE — Telephone Encounter (Signed)
Because she is on Lipitor we should avoid Paxlovid.  I recommend molnupiravir 800 mg p.o. every 12 x 5 days.

## 2023-07-04 NOTE — Telephone Encounter (Signed)
Pt tested positive for covid this morning. Is asking if a RX can be called in

## 2023-07-10 DIAGNOSIS — I671 Cerebral aneurysm, nonruptured: Secondary | ICD-10-CM | POA: Diagnosis not present

## 2023-07-10 DIAGNOSIS — Z8679 Personal history of other diseases of the circulatory system: Secondary | ICD-10-CM | POA: Diagnosis not present

## 2023-07-10 DIAGNOSIS — Z9889 Other specified postprocedural states: Secondary | ICD-10-CM | POA: Diagnosis not present

## 2023-07-11 DIAGNOSIS — M62838 Other muscle spasm: Secondary | ICD-10-CM | POA: Diagnosis not present

## 2023-07-11 DIAGNOSIS — M25561 Pain in right knee: Secondary | ICD-10-CM | POA: Diagnosis not present

## 2023-07-15 DIAGNOSIS — M461 Sacroiliitis, not elsewhere classified: Secondary | ICD-10-CM | POA: Diagnosis not present

## 2023-07-15 DIAGNOSIS — G8929 Other chronic pain: Secondary | ICD-10-CM | POA: Diagnosis not present

## 2023-07-15 DIAGNOSIS — M7062 Trochanteric bursitis, left hip: Secondary | ICD-10-CM | POA: Diagnosis not present

## 2023-07-15 DIAGNOSIS — M7918 Myalgia, other site: Secondary | ICD-10-CM | POA: Diagnosis not present

## 2023-07-15 DIAGNOSIS — M1612 Unilateral primary osteoarthritis, left hip: Secondary | ICD-10-CM | POA: Diagnosis not present

## 2023-07-16 DIAGNOSIS — M62838 Other muscle spasm: Secondary | ICD-10-CM | POA: Diagnosis not present

## 2023-07-16 DIAGNOSIS — M25561 Pain in right knee: Secondary | ICD-10-CM | POA: Diagnosis not present

## 2023-07-17 ENCOUNTER — Other Ambulatory Visit: Payer: Self-pay | Admitting: Neurosurgery

## 2023-07-17 DIAGNOSIS — Z8679 Personal history of other diseases of the circulatory system: Secondary | ICD-10-CM

## 2023-07-17 DIAGNOSIS — I671 Cerebral aneurysm, nonruptured: Secondary | ICD-10-CM

## 2023-07-18 DIAGNOSIS — M62838 Other muscle spasm: Secondary | ICD-10-CM | POA: Diagnosis not present

## 2023-07-18 DIAGNOSIS — M25561 Pain in right knee: Secondary | ICD-10-CM | POA: Diagnosis not present

## 2023-08-04 DIAGNOSIS — M1711 Unilateral primary osteoarthritis, right knee: Secondary | ICD-10-CM | POA: Diagnosis not present

## 2023-08-04 DIAGNOSIS — Z96651 Presence of right artificial knee joint: Secondary | ICD-10-CM | POA: Diagnosis not present

## 2023-08-07 ENCOUNTER — Encounter: Payer: Self-pay | Admitting: Family Medicine

## 2023-08-07 ENCOUNTER — Ambulatory Visit (INDEPENDENT_AMBULATORY_CARE_PROVIDER_SITE_OTHER): Payer: Medicare Other | Admitting: Family Medicine

## 2023-08-07 VITALS — BP 138/68 | HR 70 | Temp 98.0°F | Ht 62.25 in | Wt 162.1 lb

## 2023-08-07 DIAGNOSIS — R051 Acute cough: Secondary | ICD-10-CM | POA: Insufficient documentation

## 2023-08-07 DIAGNOSIS — E113293 Type 2 diabetes mellitus with mild nonproliferative diabetic retinopathy without macular edema, bilateral: Secondary | ICD-10-CM

## 2023-08-07 DIAGNOSIS — Z8709 Personal history of other diseases of the respiratory system: Secondary | ICD-10-CM | POA: Diagnosis not present

## 2023-08-07 LAB — POC COVID19 BINAXNOW: SARS Coronavirus 2 Ag: NEGATIVE

## 2023-08-07 LAB — POCT INFLUENZA A/B
Influenza A, POC: NEGATIVE
Influenza B, POC: NEGATIVE

## 2023-08-07 MED ORDER — PREDNISONE 10 MG PO TABS: ORAL_TABLET | ORAL | 0 refills | Status: DC

## 2023-08-07 MED ORDER — DOXYCYCLINE HYCLATE 100 MG PO TABS
100.0000 mg | ORAL_TABLET | Freq: Two times a day (BID) | ORAL | 0 refills | Status: AC
Start: 2023-08-07 — End: 2023-08-14

## 2023-08-07 MED ORDER — GUAIFENESIN-CODEINE 100-10 MG/5ML PO SOLN
5.0000 mL | Freq: Four times a day (QID) | ORAL | 0 refills | Status: DC | PRN
Start: 2023-08-07 — End: 2023-09-10

## 2023-08-07 NOTE — Progress Notes (Signed)
Patient ID: Katie [name], female    DOB: 29-Aug-1945, 77 y.o.   MRN: [id number]  This visit was conducted in person.  BP 138/68   Pulse 70   Temp 98 F (36.7 C)   Ht 5' 2.25" (1.581 m)   Wt 162 lb 2 oz (73.5 kg)   LMP 08/26/1992 (Approximate)   SpO2 95%   BMI 29.42 kg/m    CC:  Chief Complaint  Patient presents with   Cough    C/o deep cough, chest/nasal congestion and wheezing. Sxs started 08/04/23. Neg home Covid test- 08/04/23. Tried OTC meds, not helpful. Recently returned from cruise.     Subjective:   HPI: Katie [name] is a 77 y.o. female presenting on 08/07/2023 for Cough (C/o deep cough, chest/nasal congestion and wheezing. Sxs started 08/04/23. Neg home Covid test- 08/04/23. Tried OTC meds, not helpful. Recently returned from cruise. )   Date of onset:  12/9... on day 3 Initial symptoms included  watery eyes, fatigue, nasal congestion Symptoms progressed to deep cough,   no productive and ST  Wheeze and  no shortness of breath  No fever.  Chest soreness from coughing Worsening over time.  Cough keeping her up at night    Sick contacts:  no known, but did just return from cruise. COVID testing:   negative on day 1 of illness.     She has tried to treat with  robitussin, mucinex     No history of chronic lung disease such as asthma or COPD but does have history of bronchiectasis secondary to chemo as well as  reactive airways dysfunction. Non-smoker. History DM       Relevant past medical, surgical, family and social history reviewed and updated as indicated. Interim medical history since our last visit reviewed. Allergies and medications reviewed and updated. Outpatient Medications Prior to Visit  Medication Sig Dispense Refill   alendronate (FOSAMAX) 70 MG tablet TAKE 1 TABLET(70 MG) BY MOUTH 1 TIME A WEEK WITH A FULL GLASS OF WATER AND ON AN EMPTY STOMACH 12 tablet 3   anastrozole (ARIMIDEX) 1 MG tablet TAKE 1 TABLET(1 MG) BY MOUTH DAILY 90  tablet 3   atorvastatin (LIPITOR) 20 MG tablet Take 1 tablet (20 mg total) by mouth daily. 90 tablet 2   budesonide-formoterol (SYMBICORT) 160-4.5 MCG/ACT inhaler Inhale 2 puffs into the lungs in the morning and at bedtime. 10.2 g 12   Calcium Carbonate-Vit D-Min (CALTRATE 600+D PLUS MINERALS) 600-800 MG-UNIT TABS Take 1 tablet by mouth in the morning and at bedtime.     clobetasol (TEMOVATE) 0.05 % external solution Apply 1 application topically 2 (two) times daily as needed (scalp). Reported on 08/16/2015     Dulaglutide (TRULICITY) 1.5 MG/0.5ML SOPN Inject 1.5 mg into the skin once a week. 6 mL 3   empagliflozin (JARDIANCE) 10 MG TABS tablet Take 1 tablet (10 mg total) by mouth daily before breakfast. 30 tablet 11   fenofibrate (TRICOR) 145 MG tablet Take 1 tablet (145 mg total) by mouth daily. 90 tablet 2   fluticasone (FLONASE) 50 MCG/ACT nasal spray SHAKE LIQUID AND USE 2 SPRAYS IN EACH NOSTRIL EVERY DAY 16 g 11   gabapentin (NEURONTIN) 300 MG capsule TAKE 1 CAPSULE(300 MG) BY MOUTH AT BEDTIME 90 capsule 3   glucose blood (FREESTYLE TEST STRIPS) test strip USE TO CHECK BLOOD SUGAR DAILY 50 strip 11   loratadine (CLARITIN) 10 MG tablet Take 10 mg by mouth daily.  losartan-hydrochlorothiazide (HYZAAR) 100-25 MG tablet TAKE 1 TABLET BY MOUTH DAILY 90 tablet 1   metroNIDAZOLE (METROCREAM) 0.75 % cream Apply 1 Application topically 2 (two) times daily as needed.     Multiple Vitamins-Minerals (CENTRUM SILVER 50+WOMEN) TABS Take 1 tablet by mouth daily.     Polyethyl Glycol-Propyl Glycol (SYSTANE OP) Apply to eye as needed.     Probiotic Product (PROBIOTIC DAILY PO) Take by mouth. Schiff Digestive Advantage     psyllium (METAMUCIL) 58.6 % powder Take 1 packet by mouth daily.     valACYclovir (VALTREX) 500 MG tablet Take 1 tablet (500 mg total) by mouth 2 (two) times daily.     Continuous Glucose Sensor (DEXCOM G7 SENSOR) MISC Apply every 10 days as discussed, Dx Z61.0960 4 each 11   No  facility-administered medications prior to visit.     Per HPI unless specifically indicated in ROS section below Review of Systems  Constitutional:  Positive for fatigue. Negative for fever.  HENT:  Positive for congestion and sore throat. Negative for sinus pressure, sinus pain and sneezing.   Eyes:  Negative for pain.  Respiratory:  Positive for cough and wheezing. Negative for shortness of breath.   Cardiovascular:  Negative for chest pain, palpitations and leg swelling.  Gastrointestinal:  Negative for abdominal pain.  Genitourinary:  Negative for dysuria and vaginal bleeding.  Musculoskeletal:  Negative for back pain.  Neurological:  Negative for syncope, light-headedness and headaches.  Psychiatric/Behavioral:  Negative for dysphoric mood.    Objective:  BP 138/68   Pulse 70   Temp 98 F (36.7 C)   Ht 5' 2.25" (1.581 m)   Wt 162 lb 2 oz (73.5 kg)   LMP 08/26/1992 (Approximate)   SpO2 95%   BMI 29.42 kg/m   Wt Readings from Last 3 Encounters:  08/07/23 162 lb 2 oz (73.5 kg)  06/10/23 163 lb 8 oz (74.2 kg)  06/09/23 162 lb (73.5 kg)      Physical Exam Constitutional:      General: She is not in acute distress.    Appearance: Normal appearance. She is well-developed. She is not ill-appearing or toxic-appearing.  HENT:     Head: Normocephalic.     Right Ear: Hearing, tympanic membrane, ear canal and external ear normal. Tympanic membrane is not erythematous, retracted or bulging.     Left Ear: Hearing, tympanic membrane, ear canal and external ear normal. Tympanic membrane is not erythematous, retracted or bulging.     Nose: No mucosal edema or rhinorrhea.     Right Sinus: No maxillary sinus tenderness or frontal sinus tenderness.     Left Sinus: No maxillary sinus tenderness or frontal sinus tenderness.     Mouth/Throat:     Pharynx: Uvula midline.  Eyes:     General: Lids are normal. Lids are everted, no foreign bodies appreciated.     Conjunctiva/sclera:  Conjunctivae normal.     Pupils: Pupils are equal, round, and reactive to light.  Neck:     Thyroid: No thyroid mass or thyromegaly.     Vascular: No carotid bruit.     Trachea: Trachea normal.  Cardiovascular:     Rate and Rhythm: Normal rate and regular rhythm.     Pulses: Normal pulses.     Heart sounds: Normal heart sounds, S1 normal and S2 normal. No murmur heard.    No friction rub. No gallop.  Pulmonary:     Effort: Pulmonary effort is normal. No tachypnea or respiratory distress.  Breath sounds: Normal breath sounds. No decreased breath sounds, wheezing, rhonchi or rales.  Abdominal:     General: Bowel sounds are normal.     Palpations: Abdomen is soft.     Tenderness: There is no abdominal tenderness.  Musculoskeletal:     Cervical back: Normal range of motion and neck supple.  Skin:    General: Skin is warm and dry.     Findings: No rash.  Neurological:     Mental Status: She is alert.  Psychiatric:        Mood and Affect: Mood is not anxious or depressed.        Speech: Speech normal.        Behavior: Behavior normal. Behavior is cooperative.        Thought Content: Thought content normal.        Judgment: Judgment normal.       Results for orders placed or performed in visit on 08/07/23  POC COVID-19 BinaxNow   Collection Time: 08/07/23  9:03 AM  Result Value Ref Range   SARS Coronavirus 2 Ag Negative Negative  POCT Influenza A/B   Collection Time: 08/07/23  9:03 AM  Result Value Ref Range   Influenza A, POC Negative Negative   Influenza B, POC Negative Negative    Assessment and Plan  Acute cough Assessment & Plan:  Acute, most likely viral infection.  Negative COVID and flu test.  Given personal history of bronchiectasis some increased concern for possible bacterial infection. Will treat with Mucinex, nighttime cough suppressant and prednisone taper.  If symptoms not improving as expected she will then fill prescription for antibiotics.  Return  and ER precautions provided.  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  Dispense: 100 mL; Refill: 0 -     Doxycycline Hyclate; Take 1 tablet (100 mg total) by mouth 2 (two) times daily for 7 days.  Dispense: 14 tablet; Refill: 0    No follow-ups on file.   Kerby Nora, MD

## 2023-08-07 NOTE — Patient Instructions (Addendum)
Push fluids.  Rest.  Complete prednisone taper. Follow blood sugar at home, follow low carb diet especially while on steroids.  Start plain Mucinex , no decongestant.  If not improving in next 2-3 days, fill prescription for antibiotics.  Go to ER if severe shortness of breath.

## 2023-08-07 NOTE — Assessment & Plan Note (Signed)
With prior history of breast implants that were ruptured/replaced, patient had cellulitis LIQ left breast for 1 week, and UOQ asymmetry 1.5 cm, MRI right breast biopsy benign, left breast numerous masses 7 cm skin thickening: 2 biopsies from left breast: Grade 2-3 IDC ER PR positive, HER-2 positive, Ki-67 50% Clinical stage: T4 N0 M0 stage IIIb   Treatment summary: 1. Neoadjuvant chemotherapy with TCH Perjeta 6 cycles completed 12/30/2018 followed by Herceptin maintenance for 1 year completed 12/01/2019 2. Followed by Bil mastectomies with sentinel lymph node study 03/08/2019: Left mastectomy: Grade 2 IDC spanning 7.5 cm, margins negative, right mastectomy: Benign, ER 95%, PR 5%, HER-2 1+ negative pathology lost the lymph node excision 3. Followed by adjuvant radiation therapy started 05/04/2019-06/14/2019 4.  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 Anastrozole toxicities: None   CT CAP 12/08/2019: Postsurgical and postradiation changes.  Scattered areas of bronchiectasis in the lungs.  Atherosclerosis and diverticulosis.  No evidence of metastatic disease.   Breast cancer surveillance: 1.  Breast exam 08/08/2023: Benign 2. mammogram: No role of imaging studies since she had bilateral mastectomies.   Brain aneurysm: Following at [facility].   Right upper quadrant abdominal pain:08/10/21 CT abdomen and pelvis: Benign Elevated ferritin with low iron saturation: Acute phase reactant.    Return to clinic in 1 year for follow-up

## 2023-08-07 NOTE — Assessment & Plan Note (Signed)
Acute, most likely viral infection.  Negative COVID and flu test.  Given personal history of bronchiectasis some increased concern for possible bacterial infection. Will treat with Mucinex, nighttime cough suppressant and prednisone taper.  If symptoms not improving as expected she will then fill prescription for antibiotics.  Return and ER precautions provided.

## 2023-08-07 NOTE — Assessment & Plan Note (Signed)
Chronic, follow blood sugar closely and keep up with water and low-carb diet while on prednisone taper

## 2023-08-08 ENCOUNTER — Inpatient Hospital Stay: Payer: Medicare Other | Attending: Hematology and Oncology | Admitting: Hematology and Oncology

## 2023-08-08 VITALS — BP 146/52 | HR 71 | Temp 97.5°F | Resp 18 | Ht 62.25 in | Wt 163.3 lb

## 2023-08-08 DIAGNOSIS — M810 Age-related osteoporosis without current pathological fracture: Secondary | ICD-10-CM | POA: Diagnosis not present

## 2023-08-08 DIAGNOSIS — Z923 Personal history of irradiation: Secondary | ICD-10-CM | POA: Insufficient documentation

## 2023-08-08 DIAGNOSIS — Z9013 Acquired absence of bilateral breasts and nipples: Secondary | ICD-10-CM | POA: Diagnosis not present

## 2023-08-08 DIAGNOSIS — Z170 Estrogen receptor positive status [ER+]: Secondary | ICD-10-CM | POA: Insufficient documentation

## 2023-08-08 DIAGNOSIS — G629 Polyneuropathy, unspecified: Secondary | ICD-10-CM | POA: Diagnosis not present

## 2023-08-08 DIAGNOSIS — Z79811 Long term (current) use of aromatase inhibitors: Secondary | ICD-10-CM | POA: Insufficient documentation

## 2023-08-08 DIAGNOSIS — Z79899 Other long term (current) drug therapy: Secondary | ICD-10-CM | POA: Insufficient documentation

## 2023-08-08 DIAGNOSIS — C50412 Malignant neoplasm of upper-outer quadrant of left female breast: Secondary | ICD-10-CM | POA: Diagnosis not present

## 2023-08-08 DIAGNOSIS — Z9221 Personal history of antineoplastic chemotherapy: Secondary | ICD-10-CM | POA: Diagnosis not present

## 2023-08-08 NOTE — Progress Notes (Signed)
Patient Care Team: [name], MD as PCP - General (Family Medicine) Debbe Odea, MD as PCP - Cardiology (Cardiology) Galen Manila, MD as Referring Physician (Ophthalmology) Debbrah Alar, MD as Consulting Physician (Dermatology) Ricarda Frame, MD as Consulting Physician (General Surgery) Tobe Sos, MD as Consulting Physician (Dentistry) Glenna Fellows, MD (Inactive) as Consulting Physician (General Surgery) Serena Croissant, MD as Consulting Physician (Hematology and Oncology) Lonie Peak, MD as Attending Physician (Radiation Oncology) Emelia Loron, MD as Consulting Physician (General Surgery)  DIAGNOSIS:  Encounter Diagnosis  Name Primary?   Malignant neoplasm of upper-outer quadrant of left breast in female, estrogen receptor positive (HCC) Yes    SUMMARY OF ONCOLOGIC HISTORY: Oncology History  Malignant neoplasm of upper-outer quadrant of left breast in female, estrogen receptor positive (HCC)  09/14/2018 Initial Diagnosis   With prior history of breast implants that were ruptured/replaced, patient had cellulitis LIQ left breast for 1 week, and UOQ asymmetry 1.5 cm, MRI right breast biopsy benign, left breast numerous masses 7 cm skin thickening: 2 biopsies from left breast: Grade 2-3 IDC ER PR positive, HER-2 positive, Ki-67 50%   09/16/2018 Cancer Staging   Staging form: Breast, AJCC 8th Edition - Clinical stage from 09/16/2018: Stage IIIB (cT4, cN0, cM0, G3, ER+, PR+, HER2+)    09/23/2018 Genetic Testing   Genetic testing performed through [facility]'s Common Hereditary Cancers Panel reported out on 09/23/2018 showed no pathogenic mutations.    The Common Hereditary Cancers Panel offered by [facility] includes sequencing and/or deletion duplication testing of the following 47 genes: APC, ATM, AXIN2, BARD1, BMPR1A, BRCA1, BRCA2, BRIP1, CDH1, CDKN2A (p14ARF), CDKN2A (p16INK4a), CKD4, CHEK2, CTNNA1, DICER1, EPCAM (Deletion/duplication testing only),  GREM1 (promoter region deletion/duplication testing only), KIT, MEN1, MLH1, MSH2, MSH3, MSH6, MUTYH, NBN, NF1, NHTL1, PALB2, PDGFRA, PMS2, POLD1, POLE, PTEN, RAD50, RAD51C, RAD51D, SDHB, SDHC, SDHD, SMAD4, SMARCA4. STK11, TP53, TSC1, TSC2, and VHL.  The following genes were evaluated for sequence changes only: SDHA and HOXB13 c.251G>A variant only.   09/30/2018 - 07/06/2020 Chemotherapy   palonosetron (ALOXI) injection 0.25 mg, 0.25 mg, Intravenous,  Once, 6 of 6 cycles. Administration: 0.25 mg (09/30/2018), 0.25 mg (10/21/2018), 0.25 mg (12/23/2018), 0.25 mg (01/13/2019), 0.25 mg (11/11/2018), 0.25 mg (12/02/2018)  pegfilgrastim-cbqv (UDENYCA) injection 6 mg, 6 mg, Subcutaneous, Once, 5 of 5 cycles. Administration: 6 mg (10/02/2018), 6 mg (10/23/2018), 6 mg (11/13/2018), 6 mg (12/04/2018), 6 mg (12/25/2018)  trastuzumab (HERCEPTIN) 600 mg in sodium chloride 0.9 % 250 mL chemo infusion, 609 mg, Intravenous,  Once, 6 of 6 cycles. Administration: 600 mg (09/30/2018), 450 mg (10/21/2018), 450 mg (12/23/2018), 450 mg (01/13/2019), 450 mg (11/11/2018), 450 mg (12/02/2018)  CARBOplatin (PARAPLATIN) 510 mg in sodium chloride 0.9 % 250 mL chemo infusion, 510 mg (103.4 % of original dose 493.2 mg), Intravenous,  Once, 6 of 6 cycles. Dose modification:   (original dose 493.2 mg, Cycle 1). Administration: 510 mg (09/30/2018), 430 mg (10/21/2018), 420 mg (12/23/2018), 420 mg (01/13/2019), 430 mg (11/11/2018), 430 mg (12/02/2018)  DOCEtaxel (TAXOTERE) 140 mg in sodium chloride 0.9 % 250 mL chemo infusion, 75 mg/m2 = 140 mg, Intravenous,  Once, 5 of 5 cycles. Dose modification: 60 mg/m2 (original dose 75 mg/m2, Cycle 2, Reason: Dose not tolerated). Administration: 140 mg (09/30/2018), 110 mg (10/21/2018), 110 mg (12/23/2018), 110 mg (11/11/2018), 110 mg (12/02/2018)  pertuzumab (PERJETA) 420 mg in sodium chloride 0.9 % 250 mL chemo infusion, 420 mg (100 % of original dose 420 mg), Intravenous, Once, 1 of 1 cycle. Dose modification: 420  mg (original dose  420 mg, Cycle 1, Reason: Provider Judgment). Administration: 420 mg (09/30/2018)  fosaprepitant (EMEND) 150 mg  dexamethasone (DECADRON) 12 mg in sodium chloride 0.9 % 145 mL IVPB, , Intravenous,  Once, 6 of 6 cycles. Administration:  (09/30/2018),  (10/21/2018),  (12/23/2018),  (01/13/2019),  (11/11/2018),  (12/02/2018)  ado-trastuzumab emtansine (KADCYLA) 260 mg in sodium chloride 0.9 % 250 mL chemo infusion, 3.6 mg/kg = 260 mg, Intravenous, Once, 6 of 10 cycles. Dose modification: 3 mg/kg (original dose 3.6 mg/kg, Cycle 5, Reason: Dose not tolerated), 2.4 mg/kg (original dose 3.6 mg/kg, Cycle 6, Reason: Provider Judgment). Administration: 260 mg (03/24/2019), 260 mg (04/14/2019), 200 mg (06/16/2019), 260 mg (05/05/2019), 200 mg (05/26/2019), 160 mg (07/07/2019)   01/19/2019 Breast MRI   Significant interval improvement, the multiple left breast masses and areas of non mass enhancement in all 4 quadrants have decreased in size and extent. The mass previously measuring 1.4 cm with diffuse enhancement now measures 9 mm with heterogeneous areas of enhancement. Current greatest transverse dimension is 5.4 x 3.9 cm, previously 7 cm x 5cm. 2. 4 mm masslike focus of enhancement at the base of the right nipple is unchanged.   03/08/2019 Surgery   Bilateral mastectomies Dwain Sarna) [phone]): right breast: no malignancy; left breast: IDC, grade 2, scattered over 7.5cm, clear margins. No lymph nodes were examined.   03/08/2019 Cancer Staging   Staging form: Breast, AJCC 8th Edition - Pathologic stage from 03/08/2019: No Stage Recommended (ypT3, pNX, cM0, G2, ER+, PR+, HER2-)   05/04/2019 - 06/14/2019 Radiation Therapy   The patient initially received a dose of 50 Gy in 25 fractions to the breast using whole-breast tangent fields. This was delivered using a 3-D conformal technique. The patient also received 50 Gy in 25 fractions to the left supraclavicular region. The pt received a boost delivering an additional 10 Gy in 5  fractions using a electron boost with electrons. The total dose was 110 Gy.   05/2019 -  Anti-estrogen oral therapy   Anastrozole     CHIEF COMPLIANT: Follow-up on anastrozole  HISTORY OF PRESENT ILLNESS:  History of Present Illness   The patient, a breast cancer survivor, has been on Anastrozole for approximately four years as part of her hormone therapy. She reports thinning hair, which she attributes to either the medication or age, and occasional night sweats. However, she has not noticed any other adverse effects from the medication.  The patient recently contracted a cold and was prescribed prednisone, cough medicine, and an antibiotic by her primary care physician. She also mentions having some lung issues, which her primary care physician is monitoring.  In addition to the above, the patient has been dealing with red spots on her skin. These spots appeared in the spring and were biopsied by a dermatologist. The biopsy results were benign.  The patient also suffers from neuropathy in her feet, for which she is taking gabapentin. She reports that the medication is not as effective as it used to be, and she occasionally experiences pain.         ALLERGIES:  is allergic to erythromycin, ciprofloxacin, daypro [oxaprozin], enalapril maleate, nsaids, and vioxx [rofecoxib].  MEDICATIONS:  Current Outpatient Medications  Medication Sig Dispense Refill   alendronate (FOSAMAX) 70 MG tablet TAKE 1 TABLET(70 MG) BY MOUTH 1 TIME A WEEK WITH A FULL GLASS OF WATER AND ON AN EMPTY STOMACH 12 tablet 3   anastrozole (ARIMIDEX) 1 MG tablet TAKE 1 TABLET(1 MG) BY MOUTH  DAILY 90 tablet 3   atorvastatin (LIPITOR) 20 MG tablet Take 1 tablet (20 mg total) by mouth daily. 90 tablet 2   budesonide-formoterol (SYMBICORT) 160-4.5 MCG/ACT inhaler Inhale 2 puffs into the lungs in the morning and at bedtime. 10.2 g 12   Calcium Carbonate-Vit D-Min (CALTRATE 600+D PLUS MINERALS) 600-800 MG-UNIT TABS  Take 1 tablet by mouth in the morning and at bedtime.     clobetasol (TEMOVATE) 0.05 % external solution Apply 1 application topically 2 (two) times daily as needed (scalp). Reported on 08/16/2015     doxycycline (VIBRA-TABS) 100 MG tablet Take 1 tablet (100 mg total) by mouth 2 (two) times daily for 7 days. 14 tablet 0   Dulaglutide (TRULICITY) 1.5 MG/0.5ML SOPN Inject 1.5 mg into the skin once a week. 6 mL 3   empagliflozin (JARDIANCE) 10 MG TABS tablet Take 1 tablet (10 mg total) by mouth daily before breakfast. 30 tablet 11   fenofibrate (TRICOR) 145 MG tablet Take 1 tablet (145 mg total) by mouth daily. 90 tablet 2   fluticasone (FLONASE) 50 MCG/ACT nasal spray SHAKE LIQUID AND USE 2 SPRAYS IN EACH NOSTRIL EVERY DAY 16 g 11   gabapentin (NEURONTIN) 300 MG capsule TAKE 1 CAPSULE(300 MG) BY MOUTH AT BEDTIME 90 capsule 3   glucose blood (FREESTYLE TEST STRIPS) test strip USE TO CHECK BLOOD SUGAR DAILY 50 strip 11   guaiFENesin-codeine 100-10 MG/5ML syrup Take 5 mLs by mouth every 6 (six) hours as needed for cough. 100 mL 0   loratadine (CLARITIN) 10 MG tablet Take 10 mg by mouth daily.     losartan-hydrochlorothiazide (HYZAAR) 100-25 MG tablet TAKE 1 TABLET BY MOUTH DAILY 90 tablet 1   metroNIDAZOLE (METROCREAM) 0.75 % cream Apply 1 Application topically 2 (two) times daily as needed.     Multiple Vitamins-Minerals (CENTRUM SILVER 50+WOMEN) TABS Take 1 tablet by mouth daily.     Polyethyl Glycol-Propyl Glycol (SYSTANE OP) Apply to eye as needed.     predniSONE (DELTASONE) 10 MG tablet 3 tabs by mouth daily x 3 days, then 2 tabs by mouth daily x 2 days then 1 tab by mouth daily x 2 days 15 tablet 0   Probiotic Product (PROBIOTIC DAILY PO) Take by mouth. [employer] Dig[employer] Advantage     psyllium (METAMUCIL) 58.6 % powder Take 1 packet by mouth daily.     valACYclovir (VALTREX) 500 MG tablet Take 1 tablet (500 mg total) by mouth 2 (two) times daily.     No current facility-administered medications  for this visit.    PHYSICAL EXAMINATION: ECOG PERFORMANCE STATUS: 1 - Symptomatic but completely ambulatory  Vitals:   08/08/23 0844  BP: (!) 146/52  Pulse: 71  Resp: 18  Temp: (!) 97.5 F (36.4 C)  SpO2: 100%   Filed Weights   08/08/23 0844  Weight: 163 lb 4.8 oz (74.1 kg)    Physical Exam   SKIN: Red spots on breast, benign.      (exam performed in the presence of a chaperone)  LABORATORY DATA:  I have reviewed the data as listed    Latest Ref Rng & Units 06/03/2023    7:33 AM 03/16/2023   11:35 AM 12/23/2022    2:02 PM  CMP  Glucose 70 - 99 mg/dL 063  016  010   BUN 6 - 23 mg/dL 27  29  22    Creatinine 0.40 - 1.20 mg/dL 9.32  3.55  7.32   Sodium 135 - 145 mEq/L 138  137  137   Potassium 3.5 - 5.1 mEq/L 4.1  3.9  3.7   Chloride 96 - 112 mEq/L 100  104  101   CO2 19 - 32 mEq/L 28  23  25    Calcium 8.4 - 10.5 mg/dL 08.6  57.8  46.9   Total Protein 6.0 - 8.3 g/dL 7.0  8.0  7.8   Total Bilirubin 0.2 - 1.2 mg/dL 0.5  0.7  0.9   Alkaline Phos 39 - 117 U/L 38  40  35   AST 0 - 37 U/L 24  20  32   ALT 0 - 35 U/L 24  23  29      Lab Results  Component Value Date   WBC 3.8 (L) 06/03/2023   HGB 12.9 06/03/2023   HCT 39.6 06/03/2023   MCV 100.4 (H) 06/03/2023   PLT 290.0 06/03/2023   NEUTROABS 2.2 06/03/2023    ASSESSMENT & PLAN:  Malignant neoplasm of upper-outer quadrant of left breast in female, estrogen receptor positive (HCC) With prior history of breast implants that were ruptured/replaced, patient had cellulitis LIQ left breast for 1 week, and UOQ asymmetry 1.5 cm, MRI right breast biopsy benign, left breast numerous masses 7 cm skin thickening: 2 biopsies from left breast: Grade 2-3 IDC ER PR positive, HER-2 positive, Ki-67 50% Clinical stage: T4 N0 M0 stage IIIb   Treatment summary: 1. Neoadjuvant chemotherapy with TCH Perjeta 6 cycles completed 12/30/2018 followed by Herceptin maintenance for 1 year completed 12/01/2019 2. Followed by Bil mastectomies with  sentinel lymph node study 03/08/2019: Left mastectomy: Grade 2 IDC spanning 7.5 cm, margins negative, right mastectomy: Benign, ER 95%, PR 5%, HER-2 1+ negative pathology lost the lymph node excision 3. Followed by adjuvant radiation therapy started 05/04/2019-06/14/2019 4.  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 Anastrozole toxicities: None   CT CAP 12/08/2019: Postsurgical and postradiation changes.  Scattered areas of bronchiectasis in the lungs.  Atherosclerosis and diverticulosis.  No evidence of metastatic disease.   Breast cancer surveillance: 1.  Breast exam 08/08/2023: Benign 2. mammogram: No role of imaging studies since she had bilateral mastectomies.   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   Right upper quadrant abdominal pain:08/10/21 CT abdomen and pelvis: Benign Elevated ferritin with low iron saturation: Acute phase reactant.    Return to clinic in 1 year for follow-up   Osteoporosis On Fosamax with no reported issues. -Continue Fosamax.   Follow-up in December 2025.         No orders of the defined types were placed in this encounter.  The patient has a good understanding of the overall plan. she agrees with it. she will call with any problems that may develop before the next visit here. Total time spent: 30 mins including face to face time and time spent for planning, charting and co-ordination of care   Tamsen Meek, MD 08/08/23

## 2023-08-21 DIAGNOSIS — Z923 Personal history of irradiation: Secondary | ICD-10-CM | POA: Diagnosis not present

## 2023-08-21 DIAGNOSIS — D2261 Melanocytic nevi of right upper limb, including shoulder: Secondary | ICD-10-CM | POA: Diagnosis not present

## 2023-08-21 DIAGNOSIS — L821 Other seborrheic keratosis: Secondary | ICD-10-CM | POA: Diagnosis not present

## 2023-08-21 DIAGNOSIS — L6612 Frontal fibrosing alopecia: Secondary | ICD-10-CM | POA: Diagnosis not present

## 2023-08-21 DIAGNOSIS — D2262 Melanocytic nevi of left upper limb, including shoulder: Secondary | ICD-10-CM | POA: Diagnosis not present

## 2023-08-21 DIAGNOSIS — D2271 Melanocytic nevi of right lower limb, including hip: Secondary | ICD-10-CM | POA: Diagnosis not present

## 2023-08-21 DIAGNOSIS — D225 Melanocytic nevi of trunk: Secondary | ICD-10-CM | POA: Diagnosis not present

## 2023-08-21 DIAGNOSIS — D2272 Melanocytic nevi of left lower limb, including hip: Secondary | ICD-10-CM | POA: Diagnosis not present

## 2023-08-25 ENCOUNTER — Other Ambulatory Visit: Payer: Self-pay | Admitting: *Deleted

## 2023-08-25 MED ORDER — ATORVASTATIN CALCIUM 20 MG PO TABS: 20.0000 mg | ORAL_TABLET | Freq: Every day | ORAL | 0 refills | Status: DC

## 2023-08-27 ENCOUNTER — Encounter: Payer: Self-pay | Admitting: Family Medicine

## 2023-08-28 ENCOUNTER — Telehealth: Payer: Self-pay

## 2023-08-28 ENCOUNTER — Other Ambulatory Visit (HOSPITAL_COMMUNITY): Payer: Self-pay

## 2023-08-28 NOTE — Telephone Encounter (Signed)
 Pharmacy Patient Advocate Encounter   Received notification from Patient Advice Request messages that prior authorization for Trulicity  1.5MG /0.5ML auto-injectors is required/requested.   Insurance verification completed.   The patient is insured through [employer] .   Per test claim: PA required; PA submitted to above mentioned insurance via CoverMyMeds Key/confirmation #/EOC [id number] Status is pending

## 2023-08-28 NOTE — Telephone Encounter (Signed)
 ERROR/DUPLICATE

## 2023-08-28 NOTE — Telephone Encounter (Signed)
 PA request has been Submitted. New Encounter created for follow up. For additional info see Pharmacy Prior Auth telephone encounter from 08/28/2023.

## 2023-08-28 NOTE — Telephone Encounter (Signed)
 Pharmacy Patient Advocate Encounter  Received notification from [employer] that Prior Authorization for Trulicity  1.5MG /0.5ML auto-injectors has been APPROVED from 08/28/23 to 08/25/24. Ran test claim, Copay is $80.00. This test claim was processed through [facility]- copay amounts may vary at other pharmacies due to pharmacy/plan contracts, or as the patient moves through the different stages of their insurance plan.   PA #/Case ID/Reference #: [id number]   Left a voicemail with [facility] regarding the approval.

## 2023-09-03 ENCOUNTER — Other Ambulatory Visit (INDEPENDENT_AMBULATORY_CARE_PROVIDER_SITE_OTHER): Payer: Medicare PPO

## 2023-09-03 DIAGNOSIS — E113293 Type 2 diabetes mellitus with mild nonproliferative diabetic retinopathy without macular edema, bilateral: Secondary | ICD-10-CM | POA: Diagnosis not present

## 2023-09-03 LAB — LIPID PANEL
Cholesterol: 169 mg/dL (ref 0–200)
HDL: 39.9 mg/dL (ref >39.00)
LDL Cholesterol: 81 mg/dL (ref 0–99)
NonHDL: 129
Total CHOL/HDL Ratio: 4
Triglycerides: 239 mg/dL — ABNORMAL HIGH (ref 0.0–149.0)
VLDL: 47.8 mg/dL — ABNORMAL HIGH (ref 0.0–40.0)

## 2023-09-03 LAB — HEMOGLOBIN A1C: Hgb A1c MFr Bld: 8.4 % — ABNORMAL HIGH (ref 4.6–6.5)

## 2023-09-03 LAB — COMPREHENSIVE METABOLIC PANEL
ALT: 23 U/L (ref 0–35)
AST: 24 U/L (ref 0–37)
Albumin: 4.3 g/dL (ref 3.5–5.2)
Alkaline Phosphatase: 38 U/L — ABNORMAL LOW (ref 39–117)
BUN: 29 mg/dL — ABNORMAL HIGH (ref 6–23)
CO2: 29 meq/L (ref 19–32)
Calcium: 11.5 mg/dL — ABNORMAL HIGH (ref 8.4–10.5)
Chloride: 98 meq/L (ref 96–112)
Creatinine, Ser: 1.16 mg/dL (ref 0.40–1.20)
GFR: 45.08 mL/min — ABNORMAL LOW (ref >60.00)
Glucose, Bld: 191 mg/dL — ABNORMAL HIGH (ref 70–99)
Potassium: 3.5 meq/L (ref 3.5–5.1)
Sodium: 137 meq/L (ref 135–145)
Total Bilirubin: 0.5 mg/dL (ref 0.2–1.2)
Total Protein: 7.3 g/dL (ref 6.0–8.3)

## 2023-09-04 NOTE — Progress Notes (Signed)
 No critical labs need to be addressed urgently. We will discuss labs in detail at upcoming office visit.

## 2023-09-10 ENCOUNTER — Ambulatory Visit: Payer: Medicare PPO | Admitting: Family Medicine

## 2023-09-10 VITALS — BP 124/62 | HR 64 | Temp 98.2°F | Ht 62.25 in | Wt 160.8 lb

## 2023-09-10 DIAGNOSIS — Z7984 Long term (current) use of oral hypoglycemic drugs: Secondary | ICD-10-CM

## 2023-09-10 DIAGNOSIS — E1169 Type 2 diabetes mellitus with other specified complication: Secondary | ICD-10-CM

## 2023-09-10 DIAGNOSIS — Z7985 Long-term (current) use of injectable non-insulin antidiabetic drugs: Secondary | ICD-10-CM

## 2023-09-10 DIAGNOSIS — E113293 Type 2 diabetes mellitus with mild nonproliferative diabetic retinopathy without macular edema, bilateral: Secondary | ICD-10-CM

## 2023-09-10 DIAGNOSIS — E1159 Type 2 diabetes mellitus with other circulatory complications: Secondary | ICD-10-CM

## 2023-09-10 DIAGNOSIS — E785 Hyperlipidemia, unspecified: Secondary | ICD-10-CM

## 2023-09-10 DIAGNOSIS — I152 Hypertension secondary to endocrine disorders: Secondary | ICD-10-CM

## 2023-09-10 MED ORDER — FREESTYLE LITE TEST VI STRP: ORAL_STRIP | 12 refills | Status: DC

## 2023-09-10 MED ORDER — TRULICITY 3 MG/0.5ML ~~LOC~~ SOAJ: 3.0000 mg | SUBCUTANEOUS | 11 refills | Status: DC

## 2023-09-10 NOTE — Assessment & Plan Note (Addendum)
 Stable, chronic.  Continue current medication.   Previously controlled on losartan  hydrochlorothiazide  100/25 mg p.o. daily. .. Per patient at goal at home.

## 2023-09-10 NOTE — Assessment & Plan Note (Signed)
.   LDL at  almost at goal   Stable, chronic.  Continue current medication.   Atorvastatin  20 mg daily. Fenofibrate  145 m,g daily

## 2023-09-10 NOTE — Progress Notes (Signed)
 Patient ID: DIM CARDI, female    DOB: 1946-04-05, 78 y.o.   MRN: [id number]  This visit was conducted in person.  BP (!) 144/62   Pulse 64   Temp 98.2 F (36.8 C) (Oral)   Ht 5' 2.25" (1.581 m)   Wt 160 lb 12.8 oz (72.9 kg)   LMP 08/26/1992 (Approximate)   SpO2 94%   BMI 29.17 kg/m    CC:  Chief Complaint  Patient presents with   Diabetes    Follow up     Subjective:   HPI: Katie [name] is a 78 y.o. female presenting on 09/10/2023 for Diabetes (Follow up )  Diabetes:  worsened control. She is on Trulicity  1.5 mg weekly with previously good control of diabetes, now with  continued worsening of A1C  Also on Jardiance   10 mg daily  Had steroid injection in left hip in  last 3 months as well as steroid in low back in end of 06/2023.  Had 2 course of prednisone   Also started on symbicort   Has not been eating as well lately.. now back on track.  Lab Results  Component Value Date   HGBA1C 8.4 (H) 09/03/2023  Using medications without difficulties: none Hypoglycemic episodes: none Hyperglycemic episodes: yes Feet problems: no ulcers  Blood Sugars averaging: FBS 162-172 eye exam within last year: yes  Wt Readings from Last 3 Encounters:  09/10/23 160 lb 12.8 oz (72.9 kg)  08/08/23 163 lb 4.8 oz (74.1 kg)  08/07/23 162 lb 2 oz (73.5 kg)   Hypertension:   Inadequate control  on losartan  hydrochlorothiazide  100/25 mg p.o. daily BP Readings from Last 3 Encounters:  09/10/23 (!) 144/62  08/08/23 (!) 146/52  08/07/23 138/68  Using medication without problems or lightheadedness:  none Chest pain with exertion: none Edema: none Short of breath:  stable... see pulmonary note Average home BPs: 130s/60s Other issues:  Elevated Cholesterol: Cholesterol almost at goal on atorvastatin  20 mg p.o. daily and fenofibrate  145 mg p.o. daily Lab Results  Component Value Date   CHOL 169 09/03/2023   HDL 39.90 09/03/2023   LDLCALC 81 09/03/2023   LDLDIRECT 57.0  05/02/2020   TRIG 239.0 (H) 09/03/2023   CHOLHDL 4 09/03/2023  Using medications without problems: Muscle aches:  Diet compliance: heart healthy Exercise: 3 times a week Other complaints:   Breast cancer active treatment on Arimidex  x 4 years, plan 7  Osteoporosis treated on Fosamax  70 mg weekly, calciuma nd vit d   Elevated calcium  on labs today.    Relevant past medical, surgical, family and social history reviewed and updated as indicated. Interim medical history since our last visit reviewed. Allergies and medications reviewed and updated. Outpatient Medications Prior to Visit  Medication Sig Dispense Refill   alendronate  (FOSAMAX ) 70 MG tablet TAKE 1 TABLET(70 MG) BY MOUTH 1 TIME A WEEK WITH A FULL GLASS OF WATER AND ON AN EMPTY STOMACH 12 tablet 3   anastrozole  (ARIMIDEX ) 1 MG tablet TAKE 1 TABLET(1 MG) BY MOUTH DAILY 90 tablet 3   atorvastatin  (LIPITOR) 20 MG tablet Take 1 tablet (20 mg total) by mouth daily. 90 tablet 0   budesonide -formoterol  (SYMBICORT ) 160-4.5 MCG/ACT inhaler Inhale 2 puffs into the lungs in the morning and at bedtime. 10.2 g 12   Calcium  Carbonate-Vit D-Min (CALTRATE 600+D PLUS MINERALS) 600-800 MG-UNIT TABS Take 1 tablet by mouth in the morning and at bedtime.     clobetasol (TEMOVATE) 0.05 % external solution Apply 1  application topically 2 (two) times daily as needed (scalp). Reported on 08/16/2015     empagliflozin  (JARDIANCE ) 10 MG TABS tablet Take 1 tablet (10 mg total) by mouth daily before breakfast. 30 tablet 11   fenofibrate  (TRICOR ) 145 MG tablet Take 1 tablet (145 mg total) by mouth daily. 90 tablet 2   fluticasone  (FLONASE ) 50 MCG/ACT nasal spray SHAKE LIQUID AND USE 2 SPRAYS IN EACH NOSTRIL EVERY DAY 16 g 11   gabapentin  (NEURONTIN ) 300 MG capsule TAKE 1 CAPSULE(300 MG) BY MOUTH AT BEDTIME 90 capsule 3   glucose blood (FREESTYLE TEST STRIPS) test strip USE TO CHECK BLOOD SUGAR DAILY 50 strip 11   loratadine  (CLARITIN ) 10 MG tablet Take 10 mg  by mouth daily.     losartan -hydrochlorothiazide  (HYZAAR) 100-25 MG tablet TAKE 1 TABLET BY MOUTH DAILY 90 tablet 1   metroNIDAZOLE (METROCREAM) 0.75 % cream Apply 1 Application topically 2 (two) times daily as needed.     Multiple Vitamins-Minerals (CENTRUM SILVER 50+WOMEN) TABS Take 1 tablet by mouth daily.     Polyethyl Glycol-Propyl Glycol (SYSTANE OP) Apply to eye as needed.     Probiotic Product (PROBIOTIC DAILY PO) Take by mouth. Schiff Digestive Advantage     psyllium (METAMUCIL) 58.6 % powder Take 1 packet by mouth daily.     valACYclovir  (VALTREX ) 500 MG tablet Take 1 tablet (500 mg total) by mouth 2 (two) times daily.     Dulaglutide  (TRULICITY ) 1.5 MG/0.5ML SOPN Inject 1.5 mg into the skin once a week. 6 mL 3   guaiFENesin -codeine  100-10 MG/5ML syrup Take 5 mLs by mouth every 6 (six) hours as needed for cough. 100 mL 0   predniSONE  (DELTASONE ) 10 MG tablet 3 tabs by mouth daily x 3 days, then 2 tabs by mouth daily x 2 days then 1 tab by mouth daily x 2 days 15 tablet 0   No facility-administered medications prior to visit.     Per HPI unless specifically indicated in ROS section below Review of Systems  Constitutional:  Negative for fatigue and fever.  HENT:  Negative for congestion.   Eyes:  Negative for pain.  Respiratory:  Negative for cough and shortness of breath.   Cardiovascular:  Negative for chest pain, palpitations and leg swelling.  Gastrointestinal:  Negative for abdominal pain.  Genitourinary:  Negative for dysuria and vaginal bleeding.  Musculoskeletal:  Negative for back pain.  Neurological:  Negative for syncope, light-headedness and headaches.  Psychiatric/Behavioral:  Negative for dysphoric mood.    Objective:  BP (!) 144/62   Pulse 64   Temp 98.2 F (36.8 C) (Oral)   Ht 5' 2.25" (1.581 m)   Wt 160 lb 12.8 oz (72.9 kg)   LMP 08/26/1992 (Approximate)   SpO2 94%   BMI 29.17 kg/m   Wt Readings from Last 3 Encounters:  09/10/23 160 lb 12.8 oz (72.9  kg)  08/08/23 163 lb 4.8 oz (74.1 kg)  08/07/23 162 lb 2 oz (73.5 kg)      Physical Exam Vitals and nursing note reviewed.  Constitutional:      General: She is not in acute distress.    Appearance: Normal appearance. She is well-developed. She is not ill-appearing or toxic-appearing.  HENT:     Head: Normocephalic.     Right Ear: Hearing, tympanic membrane, ear canal and external ear normal.     Left Ear: Hearing, tympanic membrane, ear canal and external ear normal.     Nose: Nose normal.  Eyes:  General: Lids are normal. Lids are everted, no foreign bodies appreciated.     Conjunctiva/sclera: Conjunctivae normal.     Pupils: Pupils are equal, round, and reactive to light.  Neck:     Thyroid : No thyroid  mass or thyromegaly.     Vascular: No carotid bruit.     Trachea: Trachea normal.  Cardiovascular:     Rate and Rhythm: Normal rate and regular rhythm.     Heart sounds: Normal heart sounds, S1 normal and S2 normal. No murmur heard.    No gallop.  Pulmonary:     Effort: Pulmonary effort is normal. No respiratory distress.     Breath sounds: Normal breath sounds. No wheezing, rhonchi or rales.  Abdominal:     General: Bowel sounds are normal. There is no distension or abdominal bruit.     Palpations: Abdomen is soft. There is no fluid wave or mass.     Tenderness: There is no abdominal tenderness. There is no guarding or rebound.     Hernia: No hernia is present.  Musculoskeletal:     Cervical back: Normal range of motion and neck supple.  Lymphadenopathy:     Cervical: No cervical adenopathy.  Skin:    General: Skin is warm and dry.     Findings: No rash.  Neurological:     Mental Status: She is alert.     Cranial Nerves: No cranial nerve deficit.     Sensory: No sensory deficit.  Psychiatric:        Mood and Affect: Mood is not anxious or depressed.        Speech: Speech normal.        Behavior: Behavior normal. Behavior is cooperative.        Judgment: Judgment  normal.         Results for orders placed or performed in visit on 09/03/23  Comprehensive metabolic panel   Collection Time: 09/03/23  7:38 AM  Result Value Ref Range   Sodium 137 135 - 145 mEq/L   Potassium 3.5 3.5 - 5.1 mEq/L   Chloride 98 96 - 112 mEq/L   CO2 29 19 - 32 mEq/L   Glucose, Bld 191 (H) 70 - 99 mg/dL   BUN 29 (H) 6 - 23 mg/dL   Creatinine, Ser 1.61 0.40 - 1.20 mg/dL   Total Bilirubin 0.5 0.2 - 1.2 mg/dL   Alkaline Phosphatase 38 (L) 39 - 117 U/L   AST 24 0 - 37 U/L   ALT 23 0 - 35 U/L   Total Protein 7.3 6.0 - 8.3 g/dL   Albumin 4.3 3.5 - 5.2 g/dL   GFR 09.60 (L) >45.40 mL/min   Calcium  11.5 (H) 8.4 - 10.5 mg/dL  Lipid panel   Collection Time: 09/03/23  7:38 AM  Result Value Ref Range   Cholesterol 169 0 - 200 mg/dL   Triglycerides 981.1 (H) 0.0 - 149.0 mg/dL   HDL 91.47 >82.95 mg/dL   VLDL 62.1 (H) 0.0 - 30.8 mg/dL   LDL Cholesterol 81 0 - 99 mg/dL   Total CHOL/HDL Ratio 4    NonHDL 129.00   Hemoglobin A1c   Collection Time: 09/03/23  7:38 AM  Result Value Ref Range   Hgb A1c MFr Bld 8.4 (H) 4.6 - 6.5 %     COVID 19 screen:  No recent travel or known exposure to COVID19 The patient denies respiratory symptoms of COVID 19 at this time. The importance of social distancing was discussed today.  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    Increase Trulicity   to  3 mg weekly.  Jardiance  10 mg daily. Work on [employer] and restart exercise.      Relevant Medications   Dulaglutide  (TRULICITY ) 3 MG/0.5ML SOAJ   Hypercalcemia   Acute, elevation likely secondary to calcium  supplementation.  She will stop this and take vitamin D3 alone.  She will return to recheck calcium  level in 1 month.      Relevant Orders   Calcium    Hyperlipidemia associated with type 2 diabetes mellitus (HCC)   . LDL at  almost at goal    Stable, chronic.  Continue current medication.   Atorvastatin  20 mg daily. Fenofibrate  145 m,g daily      Relevant Medications   Dulaglutide  (TRULICITY ) 3 MG/0.5ML SOAJ   Hypertension associated with diabetes (HCC) (Chronic)   Stable, chronic.  Continue current medication.   Previously controlled on losartan  hydrochlorothiazide  100/25 mg p.o. daily. .. Per patient at goal at home.      Relevant Medications   Dulaglutide  (TRULICITY ) 3 MG/0.5ML SOAJ     Orders Placed This Encounter  Procedures   Calcium     Standing Status:   Future    Expiration Date:   09/09/2024     Herby Lolling, MD

## 2023-09-10 NOTE — Addendum Note (Signed)
 Addended by: Eller Gut on: 09/10/2023 09:50 AM   Modules accepted: Orders

## 2023-09-10 NOTE — Assessment & Plan Note (Addendum)
 Chronic, continued worsening of A1c.    Increase Trulicity   to  3 mg weekly.  Jardiance  10 mg daily. Work on [employer] and restart exercise.

## 2023-09-10 NOTE — Assessment & Plan Note (Signed)
 Acute, elevation likely secondary to calcium  supplementation.  She will stop this and take vitamin D3 alone.  She will return to recheck calcium  level in 1 month.

## 2023-09-10 NOTE — Patient Instructions (Addendum)
 Stop calcium  supplementation.  You take vitamin D3 alone for osteoporosis yet.  Increase Trulicity  to 3 mg weekly.  Work on low [employer], increasing exercise as tolerated.

## 2023-09-11 ENCOUNTER — Encounter: Payer: Self-pay | Admitting: Family Medicine

## 2023-10-01 ENCOUNTER — Encounter: Payer: Self-pay | Admitting: Pulmonary Disease

## 2023-10-01 ENCOUNTER — Ambulatory Visit: Payer: Medicare PPO | Admitting: Pulmonary Disease

## 2023-10-01 VITALS — BP 120/78 | HR 72 | Temp 97.1°F | Ht 62.25 in | Wt 158.0 lb

## 2023-10-01 DIAGNOSIS — J45991 Cough variant asthma: Secondary | ICD-10-CM | POA: Diagnosis not present

## 2023-10-01 DIAGNOSIS — G4733 Obstructive sleep apnea (adult) (pediatric): Secondary | ICD-10-CM | POA: Diagnosis not present

## 2023-10-01 NOTE — Progress Notes (Signed)
 Subjective:    Patient ID: Katie [name], female    DOB: 08/28/1945, 78 y.o.   MRN: [id number]  Patient Care Team: Avelina Greig BRAVO, MD as PCP - General (Family Medicine) Darliss Rogue, MD as PCP - Cardiology (Cardiology) Jaye Fallow, MD as Referring Physician (Ophthalmology) Chrystie Edmund CROME, MD as Consulting Physician (Dermatology) Shelva Dunnings, MD as Consulting Physician (General Surgery) Danley Shuck, MD as Consulting Physician (Dentistry) Mikell Katz, MD (Inactive) as Consulting Physician (General Surgery) Odean Potts, MD as Consulting Physician (Hematology and Oncology) Izell Domino, MD as Attending Physician (Radiation Oncology) Ebbie Cough, MD as Consulting Physician (General Surgery)  Chief Complaint  Patient presents with   Follow-up    No SOB or wheezing. Dry cough.  CPAP- Mask leaks. Pressure goes up too high.     BACKGROUND/INTERVAL:[name] is a 78 year old lifelong never smoker, with a history of HER-2 positive breast cancer status post bilateral mastectomies, radiation and neoadjuvant chemotherapy.  She was initially seen here on 26 January 2020 for abnormal chest x-ray and for issues with cough that started after initiation of anastrozole .  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  At that time inhaled corticosteroids were started with relief of her cough.  She was last seen here on 03 June 2023 at that time she was doing well on Symbicort  160/4.5, she notes that this is very helpful.  She has cough variant asthma.  She is on CPAP for obstructive sleep apnea.  She is very compliant.  HPI Discussed the use of AI scribe software for clinical note transcription with the patient, who gave verbal consent to proceed.  History of Present Illness   Katie [name] is a 78 year old female with cough variant asthma and sleep apnea who presents for follow-up regarding CPAP  compliance and mask issues.  She experiences issues with her CPAP machine, including waking up feeling full pressure and experiencing leakage. She manages this by turning the machine off and on, which resolves the issue. Her CPAP is set to auto-adjust between pressures of 5 to 15 cm H2O. She suspects the problem may be related to the mask seal due to her small face and nose, making it difficult to achieve a proper seal. She has tried various masks, including the Vitera [employer]), which works best despite occasional issues. Masks from [employer] are less suitable due to design and comfort issues. She has extra mask seals and considers changing them as a potential solution. She wants to try a different mask style, particularly one that does not go over the bridge of the nose, for comfort.  We previously discussed ordering a new mask with [facility], but it was not fulfilled.  This would have been a DreamWear full facemask from [place], the patient however never got the order fulfilled.  Her asthma is well-controlled with no recent problems. She confirms good compliance with her Symbicort  medication.  She does not use rescue inhaler and declines a prescription.     DATA: 09/24/2019 2D echo: LVEF 60 to 65%, normal LV function, indeterminate LV diastolic parameters. Mildly elevated pulmonary artery systolic pressure. 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  Very mild cylindrical bronchiectasis in the lungs bilaterally with some mucus impaction right lower lobe.  Mediastinal adenopathy. 02/17/2020 PFT: Normal spirometry and lung volumes 04/25/2020 HST: Moderate obstructive sleep apnea with AHI of 15.4 and SpO2 low of 76%, patient  placed on auto CPAP at 5 to 20 cm H2O 08/31/2021 2D echo: LVEF 60 to 65%, grade I DD, mildly elevated pulmonary artery systolic pressure.  In essence, unchanged from prior 05/27/2023 PFTs: FEV1 2.15 L or 117% predicted, FVC 2.41 L  or 98% predicted, FEV1/FVC 89% lung volumes were normal diffusion capacity mildly correcting by alveolar volume to 75%  Review of Systems A 10 point review of systems was performed and it is as noted above otherwise negative.   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   Acute diverticulitis 03/21/2023   Abdominal pain, left lower quadrant 03/16/2023   Burning with urination 03/07/2023   Overweight (BMI 25.0-29.9) 03/05/2022   Elevated ferritin 03/23/2021   Decreased GFR 02/23/2021   Vitamin B 12 deficiency 02/23/2021   Leukopenia 02/23/2021   Bronchiectasis without complication (HCC) 01/26/2020   Aortic atherosclerosis (HCC) 12/23/2019   Atherosclerosis of coronary artery of native heart without angina pectoris 12/23/2019   Peripheral edema 12/22/2019   Chemotherapy-induced peripheral neuropathy (HCC) 10/29/2019   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   Anemia 10/15/2018   Diabetic retinopathy (HCC) 09/29/2018   Family history of leukemia 09/16/2018   Family history of breast cancer    Malignant neoplasm of upper-outer quadrant of left breast in female, 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/11/2018   Family history of breast cancer in sister 08/28/2018   Diverticular disease 10/17/2017   Primary osteoarthritis of right knee 03/07/2017   HSV-1 (herpes simplex virus 1) infection, 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   Vitamin D  deficiency 10/11/2016   Family history of thyroid  disorder 04/04/2015   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   Counseling regarding end of life decision making 10/04/2014   OA (osteoarthritis) of neck 08/02/2014   History of duodenal ulcer 08/02/2014   Seasonal allergies 08/02/2014   Hypertension associated with diabetes (HCC) 08/02/2014   Hyperlipidemia associated with type 2 diabetes mellitus (HCC) 08/02/2014   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   History of joint replacement 03/01/2014   Diabetes mellitus type 2 with retinopathy (HCC) 11/07/2009    Social History   Tobacco Use   Smoking status: Never   Smokeless tobacco: Never  Substance Use Topics   Alcohol use: Not Currently    Alcohol/week: 0.0 standard drinks of alcohol    Comment: rarely    Allergies  Allergen Reactions   Erythromycin Anaphylaxis    REACTION: Rash, hives   Ciprofloxacin Rash   Daypro [Oxaprozin] Rash   Enalapril Maleate Nausea Only and Rash   Nsaids Nausea Only   Vioxx [Rofecoxib] Nausea Only    Current Meds  Medication Sig   alendronate  (FOSAMAX ) 70 MG tablet TAKE 1 TABLET(70 MG) BY MOUTH 1 TIME A WEEK WITH A FULL GLASS OF WATER AND ON AN EMPTY STOMACH   anastrozole  (ARIMIDEX ) 1 MG tablet TAKE 1 TABLET(1 MG) BY MOUTH DAILY   atorvastatin  (LIPITOR) 20 MG tablet Take 1 tablet (20 mg total) by mouth daily.   budesonide -formoterol  (SYMBICORT ) 160-4.5 MCG/ACT inhaler Inhale 2 puffs into the lungs in the morning and at bedtime.   Calcium  Carbonate-Vit D-Min (CALTRATE 600+D PLUS MINERALS) 600-800 MG-UNIT TABS Take 1 tablet by mouth in the morning and at bedtime.   clobetasol (TEMOVATE) 0.05 % external solution Apply 1 application topically 2 (two) times daily as needed (scalp). Reported on 08/16/2015   Dulaglutide  (TRULICITY ) 3 MG/0.5ML SOAJ Inject 3 mg  as directed once a week.   empagliflozin  (JARDIANCE ) 10 MG TABS tablet Take 1 tablet (10 mg total) by mouth daily before breakfast.   fenofibrate  (TRICOR ) 145 MG tablet Take 1 tablet (145 mg total) by mouth daily.   fluticasone  (FLONASE ) 50 MCG/ACT nasal spray SHAKE LIQUID AND USE 2 SPRAYS IN EACH NOSTRIL EVERY DAY   gabapentin  (NEURONTIN ) 300 MG capsule TAKE 1 CAPSULE(300 MG) BY MOUTH AT BEDTIME   loratadine  (CLARITIN ) 10 MG tablet Take 10 mg by mouth daily.   losartan -hydrochlorothiazide  (HYZAAR) 100-25 MG tablet TAKE 1 TABLET BY MOUTH DAILY   metroNIDAZOLE (METROCREAM)  0.75 % cream Apply 1 Application topically 2 (two) times daily as needed.   Multiple Vitamins-Minerals (CENTRUM SILVER 50+WOMEN) TABS Take 1 tablet by mouth daily.   Polyethyl Glycol-Propyl Glycol (SYSTANE OP) Apply to eye as needed.   Probiotic Product (PROBIOTIC DAILY PO) Take by mouth. Sch[employer] Digestive Advantage   psyllium (METAMUCIL) 58.6 % powder Take 1 packet by mouth daily.   valACYclovir  (VALTREX ) 500 MG tablet Take 1 tablet (500 mg total) by mouth 2 (two) times daily.    Immunization History  Administered Date(s) Administered   Fluad Quad(high Dose 65+) 04/30/2019, 05/12/2020, 04/06/2022, 04/19/2023   Hep A / Hep B 05/29/2009, 07/03/2009, 11/24/2009, 11/28/2021   Hepb-cpg 10/26/2021, 11/28/2021   Influenza, High Dose Seasonal PF 04/18/2014, 04/13/2015, 04/30/2018, 04/10/2021   Influenza, Seasonal, Injecte, Preservative Fre 06/26/2016   Influenza-Unspecified 05/17/2009, 04/02/2011, 05/20/2012, 06/02/2013, 07/01/2013, 04/18/2014, 04/13/2015, 06/27/2016, 04/11/2017   Moderna Covid-19 Vaccine Bivalent Booster 58yrs & up 04/06/2022   PFIZER Comirnaty(Gray Top)Covid-19 Tri-Sucrose Vaccine 11/29/2020   PFIZER(Purple Top)SARS-COV-2 Vaccination 10/06/2019, 10/27/2019, 04/24/2020   Pfizer Covid-19 Vaccine Bivalent Booster 68yrs & up 05/02/2021   Pfizer(Comirnaty)Fall Seasonal Vaccine 12 years and older 05/02/2023   Pneumococcal Conjugate-13 04/18/2014   Pneumococcal Polysaccharide-23 10/06/2015   Rsv, Bivalent, Protein Subunit Rsvpref,pf (Abrysvo) 06/04/2022   Td 05/17/2009, 05/12/2020   Typhoid Inactivated 05/17/2009   Unspecified SARS-COV-2 Vaccination 06/04/2022   Yellow Fever 05/17/2009   Zoster Recombinant(Shingrix) 06/07/2021, 08/11/2021   Zoster, Live 04/02/2011        Objective:     BP 120/78 (BP Location: Right Arm, Cuff Size: Normal)   Pulse 72   Temp (!) 97.1 F (36.2 C)   Ht 5' 2.25 (1.581 m)   Wt 158 lb (71.7 kg)   LMP 08/26/1992 (Approximate)   SpO2 95%    BMI 28.67 kg/m   SpO2: 95 % O2 Device: None (Room air)  GENERAL: Awake, alert, fully ambulatory, no acute distress.  No conversational dyspnea. HEAD: Normocephalic, atraumatic. EYES: Pupils equal, round, reactive to light.  No scleral icterus. MOUTH: Dentition intact.  No thrush.  Oral mucosa moist. NECK: Supple. No thyromegaly. Trachea midline. No JVD.  No adenopathy. PULMONARY: Excellent air entry bilaterally.  Lungs clear to auscultation bilaterally. CARDIOVASCULAR: S1 and S2. Regular rate and rhythm.  No rubs murmurs or gallops heard. GASTROINTESTINAL: Benign. MUSCULOSKELETAL: No joint deformity, no clubbing, no edema. NEUROLOGIC: Awake, alert, no focal deficits.  Speech is fluent.  No gait disturbance noted, fully ambulatory. SKIN: Intact,warm,dry. PSYCH: Mood and behavior appropriate        Assessment & Plan:     ICD-10-CM   1. Cough variant asthma  J45.991     2. OSA on CPAP  G47.33 Ambulatory Referral for DME      Orders Placed This Encounter  Procedures   Ambulatory Referral for DME    Referral Priority:   Routine    Referral  Type:   Durable Medical Equipment Purchase    Number of Visits Requested:   1   Discussion:    Obstructive Sleep Apnea   Reports occasional leakage and pressure issues with CPAP therapy. Current mask (Vitera) works better than others tried but still has some issues with seal due to small face and nose. Discussed potential alternative masks, including Dreamwear full face mask, which may provide better comfort and seal. Patient prefers full face mask and is willing to try the Dreamwear full face mask. Discussed that [facility] may be reluctant to provide specific masks due to her own preferences.   - Order Dreamwear full face mask for CPAP therapy   - Instruct patient to inform [employer] [facility] that substitutions are not acceptable   - Consider referral to sleep specialist if pressure issues persist    Asthma   Asthma is well-controlled with  Symbicort . No recent exacerbations reported. Patient reports good compliance with medication.  Declines rescue inhaler.  Follow-up   - Schedule follow-up appointment in six months   - Advise patient to contact clinic if any problems arise before the next scheduled visit.      Advised if symptoms do not improve or worsen, to please contact office for sooner follow up or seek emergency care.    I spent 30 minutes of dedicated to the care of this patient on the date of this encounter to include pre-visit review of records, face-to-face time with the patient discussing conditions above, post visit ordering of testing, clinical documentation with the electronic health record, making appropriate referrals as documented, and communicating necessary findings to members of the patients care team.     C. Leita Sanders, MD Advanced Bronchoscopy P[facility]    *This note was generated using voice recognition software/Dragon and/or AI transcription program.  Despite best efforts to proofread, errors can occur which can change the meaning. Any transcriptional errors that result from this process are unintentional and may not be fully corrected at the time of dictation.

## 2023-10-01 NOTE — Patient Instructions (Signed)
 VISIT SUMMARY:  Katie [name], a 78 year old female with asthma and sleep apnea, visited for a follow-up regarding her CPAP compliance and mask issues. She reported problems with her CPAP machine, including waking up feeling full pressure and experiencing leakage, which she manages by turning the machine off and on. She suspects the issue is related to the mask seal due to her small face and nose. Her asthma is well-controlled with no recent problems, and she confirms good compliance with her Symbicort  medication.  YOUR PLAN:  -OBSTRUCTIVE SLEEP APNEA: Obstructive sleep apnea is a condition where the airway becomes blocked during sleep, causing breathing pauses. You reported occasional leakage and pressure issues with your CPAP therapy. We discussed potential alternative masks, including the Dreamwear full face mask, which may provide better comfort and seal. We will order the Dreamwear full face mask for you and instruct you to inform [employer] [facility] that substitutions are not acceptable. If pressure issues persist, we may consider referring you to a sleep specialist.  -ASTHMAREACTIVE AIRWAYS: Asthma is a condition where the airways become inflamed and narrow, making it difficult to breathe. Your asthma is well-controlled with Symbicort , and you have reported no recent exacerbations. Continue your current medication as prescribed.  INSTRUCTIONS:  Please schedule a follow-up appointment in six months. If any problems arise before the next scheduled visit, contact the clinic.

## 2023-10-12 ENCOUNTER — Encounter: Payer: Self-pay | Admitting: Family Medicine

## 2023-10-13 ENCOUNTER — Other Ambulatory Visit (INDEPENDENT_AMBULATORY_CARE_PROVIDER_SITE_OTHER): Payer: Medicare PPO

## 2023-10-13 LAB — CALCIUM: Calcium: 9.7 mg/dL (ref 8.4–10.5)

## 2023-10-14 ENCOUNTER — Encounter: Payer: Self-pay | Admitting: Family Medicine

## 2023-10-17 ENCOUNTER — Ambulatory Visit: Payer: Medicare PPO | Admitting: Family Medicine

## 2023-10-17 ENCOUNTER — Encounter: Payer: Self-pay | Admitting: Family Medicine

## 2023-10-17 VITALS — BP 120/62 | HR 84 | Temp 98.0°F | Ht 62.25 in | Wt 157.5 lb

## 2023-10-17 DIAGNOSIS — R55 Syncope and collapse: Secondary | ICD-10-CM

## 2023-10-17 LAB — CBC WITH DIFFERENTIAL/PLATELET
Basophils Absolute: 0 10*3/uL (ref 0.0–0.1)
Basophils Relative: 0.9 % (ref 0.0–3.0)
Eosinophils Absolute: 0.1 10*3/uL (ref 0.0–0.7)
Eosinophils Relative: 2.2 % (ref 0.0–5.0)
HCT: 38.9 % (ref 36.0–46.0)
Hemoglobin: 13 g/dL (ref 12.0–15.0)
Lymphocytes Relative: 14.8 % (ref 12.0–46.0)
Lymphs Abs: 0.7 10*3/uL (ref 0.7–4.0)
MCHC: 33.5 g/dL (ref 30.0–36.0)
MCV: 95.4 fL (ref 78.0–100.0)
Monocytes Absolute: 0.7 10*3/uL (ref 0.1–1.0)
Monocytes Relative: 14.6 % — ABNORMAL HIGH (ref 3.0–12.0)
Neutro Abs: 3 10*3/uL (ref 1.4–7.7)
Neutrophils Relative %: 67.5 % (ref 43.0–77.0)
Platelets: 251 10*3/uL (ref 150.0–400.0)
RBC: 4.08 Mil/uL (ref 3.87–5.11)
RDW: 14.8 % (ref 11.5–15.5)
WBC: 4.5 10*3/uL (ref 4.0–10.5)

## 2023-10-17 LAB — BASIC METABOLIC PANEL
BUN: 23 mg/dL (ref 6–23)
CO2: 30 meq/L (ref 19–32)
Calcium: 9.8 mg/dL (ref 8.4–10.5)
Chloride: 97 meq/L (ref 96–112)
Creatinine, Ser: 1.09 mg/dL (ref 0.40–1.20)
GFR: 48.54 mL/min — ABNORMAL LOW (ref >60.00)
Glucose, Bld: 140 mg/dL — ABNORMAL HIGH (ref 70–99)
Potassium: 3.6 meq/L (ref 3.5–5.1)
Sodium: 136 meq/L (ref 135–145)

## 2023-10-17 NOTE — Progress Notes (Signed)
 Patient ID: Katie [name], female    DOB: 09-Aug-1946, 78 y.o.   MRN: [id number]  This visit was conducted in person.  BP 120/62 (BP Location: Right Arm, Patient Position: Sitting, Cuff Size: Normal)   Pulse 84   Temp 98 F (36.7 C) (Temporal)   Ht 5' 2.25" (1.581 m)   Wt 157 lb 8 oz (71.4 kg)   LMP 08/26/1992 (Approximate)   SpO2 98%   BMI 28.58 kg/m    CC:  Chief Complaint  Patient presents with   Dizziness    See Patient Message from 10/12/2023   Hypotension    Subjective:   HPI: Katie [name] is a 78 y.o. female presenting on 10/17/2023 for Dizziness (See Patient Message from 10/12/2023) and Hypotension  Patient reports recent trip to [place] [employer].  While she was there she had acute dizziness, felt presyncopal and confusion on the day at the park.  EMS was called.  Blood pressure was low at 90/47.  Blood sugar was slightly elevated. Felt likely dehydration.  She has pushed water with electrolytes and rested. She reports that she had no  proceeding symptoms ( no chest pain, no SOB)... she had not been drinking much, had been on her feet, standing a lot lines.f    She has history of diabetes and hypertension.  She is chronically on losartan hydrochlorothiazide 100/25 mg daily. No new medications ( except prednisone taper prior to trip for hip pain) or over-the-counter supplements. BP Readings from Last 3 Encounters:  10/17/23 120/62  10/01/23 120/78  09/10/23 124/62    BPs at home since returning have been  112-120/58-64.  On day she got back BP  was still low some dizziness after plane trip 104/57.  No further  presyncope.    Blood sugar since being home FBS 139-167, occ > 200 after meals. Prior to prednisone CBG were better.  Lab Results  Component Value Date   HGBA1C 8.4 (H) 09/03/2023   Wt Readings from Last 3 Encounters:  10/17/23 157 lb 8 oz (71.4 kg)  10/01/23 158 lb (71.7 kg)  09/10/23 160 lb 12.8 oz (72.9 kg)     Relevant past medical, surgical,  family and social history reviewed and updated as indicated. Interim medical history since our last visit reviewed. Allergies and medications reviewed and updated. Outpatient Medications Prior to Visit  Medication Sig Dispense Refill   alendronate (FOSAMAX) 70 MG tablet TAKE 1 TABLET(70 MG) BY MOUTH 1 TIME A WEEK WITH A FULL GLASS OF WATER AND ON AN EMPTY STOMACH 12 tablet 3   anastrozole (ARIMIDEX) 1 MG tablet TAKE 1 TABLET(1 MG) BY MOUTH DAILY 90 tablet 3   atorvastatin (LIPITOR) 20 MG tablet Take 1 tablet (20 mg total) by mouth daily. 90 tablet 0   budesonide-formoterol (SYMBICORT) 160-4.5 MCG/ACT inhaler Inhale 2 puffs into the lungs in the morning and at bedtime. 10.2 g 12   Calcium Carbonate-Vit D-Min (CALTRATE 600+D PLUS MINERALS) 600-800 MG-UNIT TABS Take 1 tablet by mouth in the morning and at bedtime.     clobetasol (TEMOVATE) 0.05 % external solution Apply 1 application topically 2 (two) times daily as needed (scalp). Reported on 08/16/2015     Dulaglutide (TRULICITY) 3 MG/0.5ML SOAJ Inject 3 mg as directed once a week. 6 mL 11   empagliflozin (JARDIANCE) 10 MG TABS tablet Take 1 tablet (10 mg total) by mouth daily before breakfast. 30 tablet 11   fluticasone (FLONASE) 50 MCG/ACT nasal spray SHAKE LIQUID AND USE  2 SPRAYS IN EACH NOSTRIL EVERY DAY 16 g 11   gabapentin (NEURONTIN) 300 MG capsule TAKE 1 CAPSULE(300 MG) BY MOUTH AT BEDTIME 90 capsule 3   loratadine (CLARITIN) 10 MG tablet Take 10 mg by mouth daily.     losartan-hydrochlorothiazide (HYZAAR) 100-25 MG tablet TAKE 1 TABLET BY MOUTH DAILY 90 tablet 1   metroNIDAZOLE (METROCREAM) 0.75 % cream Apply 1 Application topically 2 (two) times daily as needed.     Multiple Vitamins-Minerals (CENTRUM SILVER 50+WOMEN) TABS Take 1 tablet by mouth daily.     Polyethyl Glycol-Propyl Glycol (SYSTANE OP) Apply to eye as needed.     Probiotic Product (PROBIOTIC DAILY PO) Take by mouth. Schiff Digestive Advantage     psyllium (METAMUCIL) 58.6 %  powder Take 1 packet by mouth daily.     valACYclovir (VALTREX) 500 MG tablet Take 1 tablet (500 mg total) by mouth 2 (two) times daily.     fenofibrate (TRICOR) 145 MG tablet Take 1 tablet (145 mg total) by mouth daily. 90 tablet 2   No facility-administered medications prior to visit.     Per HPI unless specifically indicated in ROS section below Review of Systems  Constitutional:  Negative for fatigue and fever.  HENT:  Negative for congestion.   Eyes:  Negative for pain.  Respiratory:  Negative for cough and shortness of breath.   Cardiovascular:  Negative for chest pain, palpitations and leg swelling.  Gastrointestinal:  Negative for abdominal pain.  Genitourinary:  Negative for dysuria and vaginal bleeding.  Musculoskeletal:  Negative for back pain.  Neurological:  Positive for dizziness. Negative for syncope, light-headedness and headaches.  Psychiatric/Behavioral:  Negative for dysphoric mood.    Objective:  BP 120/62 (BP Location: Right Arm, Patient Position: Sitting, Cuff Size: Normal)   Pulse 84   Temp 98 F (36.7 C) (Temporal)   Ht 5' 2.25" (1.581 m)   Wt 157 lb 8 oz (71.4 kg)   LMP 08/26/1992 (Approximate)   SpO2 98%   BMI 28.58 kg/m   Wt Readings from Last 3 Encounters:  10/17/23 157 lb 8 oz (71.4 kg)  10/01/23 158 lb (71.7 kg)  09/10/23 160 lb 12.8 oz (72.9 kg)      Physical Exam Constitutional:      General: She is not in acute distress.    Appearance: Normal appearance. She is well-developed. She is not ill-appearing or toxic-appearing.  HENT:     Head: Normocephalic.     Right Ear: Hearing, tympanic membrane, ear canal and external ear normal. Tympanic membrane is not erythematous, retracted or bulging.     Left Ear: Hearing, tympanic membrane, ear canal and external ear normal. Tympanic membrane is not erythematous, retracted or bulging.     Nose: No mucosal edema or rhinorrhea.     Right Sinus: No maxillary sinus tenderness or frontal sinus  tenderness.     Left Sinus: No maxillary sinus tenderness or frontal sinus tenderness.     Mouth/Throat:     Pharynx: Uvula midline.  Eyes:     General: Lids are normal. Lids are everted, no foreign bodies appreciated.     Conjunctiva/sclera: Conjunctivae normal.     Pupils: Pupils are equal, round, and reactive to light.  Neck:     Thyroid: No thyroid mass or thyromegaly.     Vascular: No carotid bruit.     Trachea: Trachea normal.  Cardiovascular:     Rate and Rhythm: Normal rate and regular rhythm.     Pulses:  Normal pulses.     Heart sounds: Normal heart sounds, S1 normal and S2 normal. No murmur heard.    No friction rub. No gallop.  Pulmonary:     Effort: Pulmonary effort is normal. No tachypnea or respiratory distress.     Breath sounds: Normal breath sounds. No decreased breath sounds, wheezing, rhonchi or rales.  Abdominal:     General: Bowel sounds are normal.     Palpations: Abdomen is soft.     Tenderness: There is no abdominal tenderness.  Musculoskeletal:     Cervical back: Normal range of motion and neck supple.  Skin:    General: Skin is warm and dry.     Findings: No rash.  Neurological:     Mental Status: She is alert.     Cranial Nerves: Cranial nerves 2-12 are intact.     Sensory: Sensation is intact.     Motor: Motor function is intact.     Coordination: Coordination is intact.     Gait: Gait is intact.  Psychiatric:        Mood and Affect: Mood is not anxious or depressed.        Speech: Speech normal.        Behavior: Behavior normal. Behavior is cooperative.        Thought Content: Thought content normal.        Judgment: Judgment normal.       Results for orders placed or performed in visit on 10/17/23  CBC with Differential/Platelet   Collection Time: 10/17/23 12:57 PM  Result Value Ref Range   WBC 4.5 4.0 - 10.5 K/uL   RBC 4.08 3.87 - 5.11 Mil/uL   Hemoglobin 13.0 12.0 - 15.0 g/dL   HCT 16.1 09.6 - 04.5 %   MCV 95.4 78.0 - 100.0 fl    MCHC 33.5 30.0 - 36.0 g/dL   RDW 40.9 81.1 - 91.4 %   Platelets 251.0 150.0 - 400.0 K/uL   Neutrophils Relative % 67.5 43.0 - 77.0 %   Lymphocytes Relative 14.8 12.0 - 46.0 %   Monocytes Relative 14.6 (H) 3.0 - 12.0 %   Eosinophils Relative 2.2 0.0 - 5.0 %   Basophils Relative 0.9 0.0 - 3.0 %   Neutro Abs 3.0 1.4 - 7.7 K/uL   Lymphs Abs 0.7 0.7 - 4.0 K/uL   Monocytes Absolute 0.7 0.1 - 1.0 K/uL   Eosinophils Absolute 0.1 0.0 - 0.7 K/uL   Basophils Absolute 0.0 0.0 - 0.1 K/uL  Basic Metabolic Panel   Collection Time: 10/17/23 12:57 PM  Result Value Ref Range   Sodium 136 135 - 145 mEq/L   Potassium 3.6 3.5 - 5.1 mEq/L   Chloride 97 96 - 112 mEq/L   CO2 30 19 - 32 mEq/L   Glucose, Bld 140 (H) 70 - 99 mg/dL   BUN 23 6 - 23 mg/dL   Creatinine, Ser 7.82 0.40 - 1.20 mg/dL   GFR 95.62 (L) >13.08 mL/min   Calcium 9.8 8.4 - 10.5 mg/dL    Assessment and Plan  Pre-syncope Assessment & Plan: Acute episode of presyncope associated with dehydration standing on her feet for hours at [place].  She may have had associated hypotension. Since returning home her blood pressures have been normal.  There was no sign or symptom of clear cardiopulmonary etiology.  Normal neuroexam in office today. She has returned to feeling well with no further episodes.  Blood sugars have been running slightly elevated without lows. We will  evaluate for labs for possible electrolyte issue or anemia but this is unlikely cause.  If presyncope or syncope recurs she will return for further evaluation and consider contacting her cardiologist.  Orders: -     CBC with Differential/Platelet -     Basic metabolic panel    No follow-ups on file.   Kerby Nora, MD

## 2023-10-28 ENCOUNTER — Other Ambulatory Visit: Payer: Self-pay | Admitting: *Deleted

## 2023-10-28 MED ORDER — FENOFIBRATE 145 MG PO TABS: 145.0000 mg | ORAL_TABLET | Freq: Every day | ORAL | 0 refills | Status: DC

## 2023-11-06 DIAGNOSIS — R55 Syncope and collapse: Secondary | ICD-10-CM | POA: Insufficient documentation

## 2023-11-06 NOTE — Assessment & Plan Note (Addendum)
 Acute episode of presyncope associated with dehydration standing on her feet for hours at [place].  She may have had associated hypotension. Since returning home her blood pressures have been normal.  There was no sign or symptom of clear cardiopulmonary etiology.  Normal neuroexam in office today. She has returned to feeling well with no further episodes.  Blood sugars have been running slightly elevated without lows. We will evaluate for labs for possible electrolyte issue or anemia but this is unlikely cause.  If presyncope or syncope recurs she will return for further evaluation and consider contacting her cardiologist.

## 2023-11-10 ENCOUNTER — Other Ambulatory Visit
Admission: RE | Admit: 2023-11-10 | Discharge: 2023-11-10 | Disposition: A | Discharge status: Home / Self Care | Attending: Cardiology | Admitting: Cardiology

## 2023-11-10 DIAGNOSIS — E782 Mixed hyperlipidemia: Secondary | ICD-10-CM | POA: Insufficient documentation

## 2023-11-10 LAB — LIPID PANEL
Cholesterol: 151 mg/dL (ref 0–200)
HDL: 40 mg/dL — ABNORMAL LOW (ref >40)
LDL Cholesterol: 79 mg/dL (ref 0–99)
Total CHOL/HDL Ratio: 3.8 ratio
Triglycerides: 162 mg/dL — ABNORMAL HIGH (ref <150)
VLDL: 32 mg/dL (ref 0–40)

## 2023-11-14 ENCOUNTER — Encounter: Payer: Self-pay | Admitting: *Deleted

## 2023-11-17 ENCOUNTER — Ambulatory Visit: Payer: Medicare PPO | Attending: Cardiology | Admitting: Cardiology

## 2023-11-17 ENCOUNTER — Encounter: Payer: Self-pay | Admitting: Cardiology

## 2023-11-17 VITALS — BP 114/76 | HR 58 | Ht 62.0 in | Wt 158.8 lb

## 2023-11-17 DIAGNOSIS — I2510 Atherosclerotic heart disease of native coronary artery without angina pectoris: Secondary | ICD-10-CM

## 2023-11-17 DIAGNOSIS — I10 Essential (primary) hypertension: Secondary | ICD-10-CM

## 2023-11-17 DIAGNOSIS — E782 Mixed hyperlipidemia: Secondary | ICD-10-CM | POA: Diagnosis not present

## 2023-11-17 MED ORDER — ATORVASTATIN CALCIUM 40 MG PO TABS: 40.0000 mg | ORAL_TABLET | Freq: Every day | ORAL | 3 refills | Status: AC

## 2023-11-17 NOTE — Patient Instructions (Signed)
 Medication Instructions:  Your physician has recommended you make the following change in your medication:   START: Lipitor 40 mg daily  *If you need a refill on your cardiac medications before your next appointment, please call your pharmacy*   Lab Work: None If you have labs (blood work) drawn today and your tests are completely normal, you will receive your results only by: MyChart Message (if you have MyChart) OR A paper copy in the mail If you have any lab test that is abnormal or we need to change your treatment, we will call you to review the results.   Testing/Procedures: None   Follow-Up: At [facility], you and your health needs are our priority.  As part of our continuing mission to provide you with exceptional heart care, we have created designated Provider Care Teams.  These Care Teams include your primary Cardiologist (physician) and Advanced Practice Providers (APPs -  Physician Assistants and Nurse Practitioners) who all work together to provide you with the care you need, when you need it.  We recommend signing up for the patient portal called "MyChart".  Sign up information is provided on this After Visit Summary.  MyChart is used to connect with patients for Virtual Visits (Telemedicine).  Patients are able to view lab/test results, encounter notes, upcoming appointments, etc.  Non-urgent messages can be sent to your provider as well.   To learn more about what you can do with MyChart, go to [email or web address].    Your next appointment:   1 year(s)  Provider:   Dr. Myriam Forehand - Sandie Ano  Other Instructions None

## 2023-11-17 NOTE — Progress Notes (Signed)
 Cardiology Office Note:    Date:  11/17/2023   ID:  Katie [name], DOB 1946/05/30, MRN [id number]  PCP:  [name], MD  Cardiologist:  Debbe Odea, MD  Electrophysiologist:  None   Referring MD: [name], MD   No chief complaint on file.   History of Present Illness:    Katie [name] is a 78 y.o. female with a hx of CAD ( chest CT 11/2019-calcified plaque in [facility]), hypertension, hyperlipidemia, diabetes, breast cancer (s/p bilateral mastectomy, chemo, radiation and immunotherapy) who presents for follow-up.   Feels well, denies chest pain or shortness of breath, recent blood work showed elevated triglycerides.  Tolerating Lipitor, fenofibrate as prescribed.  No new concerns.  Prior notes Echo 08/2021 EF 60 to 65%. Lexiscan Myoview 06/2020 no evidence for ischemia, low risk study patient s/p chemotherapy for breast cancer.  She had a chest CT to evaluate for the presence of any residual disease. Chest CT obtained on 12/08/2019 showed calcified plaque in the left main, LAD and RCA. echocardiogram 08/2019  showed normal systolic function, EF 60 to 65%.  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, small, not treating-watching , it is now stented   Blood transfusion without reported diagnosis    Cancer (HCC)    breast 08/2018   Coronary artery disease    Diverticulitis    DJD (degenerative joint disease)    DM type 2 (diabetes mellitus, type 2) (HCC)    Family history of breast cancer    Fibrocystic breast disease    GERD (gastroesophageal reflux disease)    History of chicken pox    Hypercholesteremia    Hypertension    Lichen planopilaris    Neuromuscular disorder (HCC)    Sleep apnea    cpap   Urinary incontinence    Vertigo 12/2019   1 episode    Past Surgical History:  Procedure Laterality Date   BREAST IMPLANT REMOVAL Bilateral 03/08/2019   Procedure: Removal of Bilateral Breast Implants;   Surgeon: Emelia Loron, MD;  Location: [facility] OR;  Service: General;  Laterality: Bilateral;   BREAST SURGERY  02/1975   Left Breast Biopsy-Fibrocystic Disease   BREAST SURGERY  10/1981   Bialteral Capsulectomy Breast Surgery   BREAST SURGERY  07/1998   Replace Bilateral Silicone Implants   BREAST SURGERY  01/1976   Bilater breast surgery to remove fibrocystic tissue and replace with silicone implants   CATARACT EXTRACTION W/PHACO Right 02/08/2020   Procedure: CATARACT EXTRACTION PHACO AND INTRAOCULAR LENS PLACEMENT (IOC) RIGHT DIABETIC;  Surgeon: Galen Manila, MD;  Location: [facility];  Service: Ophthalmology;  Laterality: Right;  3.55 0:33.7   CATARACT EXTRACTION W/PHACO Left 02/29/2020   Procedure: CATARACT EXTRACTION PHACO AND INTRAOCULAR LENS PLACEMENT (IOC) LEFT DIABETIC 2.93  00:27.1;  Surgeon: Galen Manila, MD;  Location: [facility];  Service: Ophthalmology;  Laterality: Left;  Diabetic - oral meds   CHOLECYSTECTOMY N/A 07/08/2016   Procedure: LAPAROSCOPIC CHOLECYSTECTOMY;  Surgeon: Ricarda Frame, MD;  Location: [facility] ORS;  Service: General;  Laterality: N/A;   COLONOSCOPY WITH PROPOFOL N/A 11/07/2017   Procedure: COLONOSCOPY WITH PROPOFOL;  Surgeon: Wyline Mood, MD;  Location: [facility] ENDOSCOPY;  Service: Gastroenterology;  Laterality: N/A;   DILATION AND CURETTAGE OF UTERUS  01/1994   ESOPHAGOGASTRODUODENOSCOPY (EGD) WITH PROPOFOL N/A 12/05/2016   Procedure: ESOPHAGOGASTRODUODENOSCOPY (EGD) WITH PROPOFOL;  Surgeon: Wyline Mood, MD;  Location: [facility]  ENDOSCOPY;  Service: Endoscopy;  Laterality: N/A;   FINGER SURGERY  2001 & 2003   Trigger Finger   FOOT SURGERY  1980   To Relieve Pinched Nerve   FOOT SURGERY  07/2008   Left Foot-Plantar Fasciitis   JOINT REPLACEMENT  04/19/2013   Hip Replacement-Right   MASTECTOMY     double   MASTECTOMY W/ SENTINEL NODE BIOPSY Bilateral 03/08/2019   Procedure: BILATERAL MASTECTOMIES WITH LEFT AXILLARY SENTINEL LYMPH  NODE BIOPSY AND BLUE DYE INJECTION;  Surgeon: Emelia Loron, MD;  Location: [facility] OR;  Service: General;  Laterality: Bilateral;   PARTIAL KNEE ARTHROPLASTY Right 12/31/2022   Procedure: RIGHT PARTIAL KNEE ARTHROPLASTY;  Surgeon: Christena Flake, MD;  Location: [facility] ORS;  Service: Orthopedics;  Laterality: Right;   PLACEMENT OF BREAST IMPLANTS  12/26/2004   Replace Failed Implants   REPLACEMENT TOTAL KNEE Left    2019-   RHINOPLASTY  03/1964   To Correct Deviated Septum    Current Medications: Current Meds  Medication Sig   alendronate (FOSAMAX) 70 MG tablet TAKE 1 TABLET(70 MG) BY MOUTH 1 TIME A WEEK WITH A FULL GLASS OF WATER AND ON AN EMPTY STOMACH   anastrozole (ARIMIDEX) 1 MG tablet TAKE 1 TABLET(1 MG) BY MOUTH DAILY   atorvastatin (LIPITOR) 40 MG tablet Take 1 tablet (40 mg total) by mouth daily.   budesonide-formoterol (SYMBICORT) 160-4.5 MCG/ACT inhaler Inhale 2 puffs into the lungs in the morning and at bedtime.   Calcium Carbonate-Vit D-Min (CALTRATE 600+D PLUS MINERALS) 600-800 MG-UNIT TABS Take 1 tablet by mouth in the morning and at bedtime.   clobetasol (TEMOVATE) 0.05 % external solution Apply 1 application topically 2 (two) times daily as needed (scalp). Reported on 08/16/2015   Dulaglutide (TRULICITY) 3 MG/0.5ML SOAJ Inject 3 mg as directed once a week.   empagliflozin (JARDIANCE) 10 MG TABS tablet Take 1 tablet (10 mg total) by mouth daily before breakfast.   fenofibrate (TRICOR) 145 MG tablet Take 1 tablet (145 mg total) by mouth daily.   fluticasone (FLONASE) 50 MCG/ACT nasal spray SHAKE LIQUID AND USE 2 SPRAYS IN EACH NOSTRIL EVERY DAY   gabapentin (NEURONTIN) 300 MG capsule TAKE 1 CAPSULE(300 MG) BY MOUTH AT BEDTIME   loratadine (CLARITIN) 10 MG tablet Take 10 mg by mouth daily.   losartan-hydrochlorothiazide (HYZAAR) 100-25 MG tablet TAKE 1 TABLET BY MOUTH DAILY   metroNIDAZOLE (METROCREAM) 0.75 % cream Apply 1 Application topically 2 (two) times daily as needed.    Multiple Vitamins-Minerals (CENTRUM SILVER 50+WOMEN) TABS Take 1 tablet by mouth daily.   Polyethyl Glycol-Propyl Glycol (SYSTANE OP) Apply to eye as needed.   Probiotic Product (PROBIOTIC DAILY PO) Take by mouth. Schiff Digestive Advantage   psyllium (METAMUCIL) 58.6 % powder Take 1 packet by mouth daily.   valACYclovir (VALTREX) 500 MG tablet Take 1 tablet (500 mg total) by mouth 2 (two) times daily.   [DISCONTINUED] atorvastatin (LIPITOR) 20 MG tablet Take 1 tablet (20 mg total) by mouth daily.     Allergies:   Erythromycin, Ciprofloxacin, Daypro [oxaprozin], Enalapril maleate, Nsaids, and Vioxx [rofecoxib]   Social History   Socioeconomic History   Marital status: Married    Spouse name: Debroah Loop   Number of children: 1   Years of education: Not on file   Highest education level: Master's degree (e.g., MA, MS, MEng, MEd, MSW, MBA)  Occupational History   Occupation: Retired  Tobacco Use   Smoking status: Never   Smokeless tobacco: Never  Vaping Use  Vaping status: Never Used  Substance and Sexual Activity   Alcohol use: Not Currently    Alcohol/week: 0.0 standard drinks of alcohol    Comment: rarely   Drug use: No   Sexual activity: Not Currently    Partners: Male  Other Topics Concern   Not on file  Social History Narrative   Married for 37 years.    She has one child and one stepston.      Social Drivers of Corporate investment banker Strain: Low Risk  (09/26/2023)   Received from [facility]   Overall Financial Resource Strain (CARDIA)    Difficulty of Paying Living Expenses: Not hard at all  Food Insecurity: No Food Insecurity (09/26/2023)   Received from [facility]   Hunger Vital Sign    Worried About Running Out of Food in the Last Year: Never true    Ran Out of Food in the Last Year: Never true  Transportation Needs: No Transportation Needs (09/26/2023)   Received from [facility] -  Transportation    In the past 12 months, has lack of transportation kept you from medical appointments or from getting medications?: No    Lack of Transportation (Non-Medical): No  Physical Activity: Sufficiently Active (09/10/2023)   Exercise Vital Sign    Days of Exercise per Week: 3 days    Minutes of Exercise per Session: 60 min  Recent Concern: Physical Activity - Insufficiently Active (08/06/2023)   Exercise Vital Sign    Days of Exercise per Week: 3 days    Minutes of Exercise per Session: 30 min  Stress: No Stress Concern Present (09/10/2023)   [employer] of Occupational Health - Occupational Stress Questionnaire    Feeling of Stress : Not at all  Social Connections: Moderately Integrated (09/10/2023)   Social Connection and Isolation Panel [NHANES]    Frequency of Communication with Friends and Family: Once a week    Frequency of Social Gatherings with Friends and Family: Once a week    Attends Religious Services: More than 4 times per year    Active Member of [employer] or Organizations: Yes    Attends Engineer, structural: More than 4 times per year    Marital Status: Married     Family History: The patient's family history includes Arthritis in her mother; Breast cancer (age of onset: 42) in her sister; Coronary artery disease in her brother; Diabetes in her father; Heart attack in her brother and father; Heart disease in her father; Hypertension in her brother and mother; Leukemia in her brother.  ROS:   Please see the history of present illness.     All other systems reviewed and are negative.  EKGs/Labs/Other Studies Reviewed:    The following studies were reviewed today:   EKG Interpretation Date/Time:  Monday November 17 2023 12:08:41 EDT Ventricular Rate:  58 PR Interval:  186 QRS Duration:  72 QT Interval:  384 QTC Calculation: 376 R Axis:   -13  Text Interpretation: Sinus bradycardia Low voltage QRS Confirmed by Debbe Odea ([id number]) on  11/17/2023 12:11:24 PM    Recent Labs: 09/03/2023: ALT 23 10/17/2023: BUN 23; Creatinine, Ser 1.09; Hemoglobin 13.0; Platelets 251.0; Potassium 3.6; Sodium 136  Recent Lipid Panel    Component Value Date/Time   CHOL 151 11/10/2023 1019   CHOL 169 06/23/2020 0813   TRIG 162 (H) 11/10/2023 1019   HDL 40 (L) 11/10/2023 1019   HDL  37 (L) 06/23/2020 0813   CHOLHDL 3.8 11/10/2023 1019   VLDL 32 11/10/2023 1019   LDLCALC 79 11/10/2023 1019   LDLCALC 85 06/23/2020 0813   LDLDIRECT 57.0 05/02/2020 0736    Physical Exam:    VS:  BP 114/76   Pulse (!) 58   Ht 5\' 2"  (1.575 m)   Wt 158 lb 12.8 oz (72 kg)   LMP 08/26/1992 (Approximate)   SpO2 95%   BMI 29.04 kg/m     Wt Readings from Last 3 Encounters:  11/17/23 158 lb 12.8 oz (72 kg)  10/17/23 157 lb 8 oz (71.4 kg)  10/01/23 158 lb (71.7 kg)     GEN:  Well nourished, well developed in no acute distress HEENT: Normal NECK: No JVD; No carotid bruits CARDIAC: RRR, no murmurs, rubs, gallops RESPIRATORY:  Clear to auscultation without rales, wheezing or rhonchi  ABDOMEN: Soft, non-tender, non-distended MUSCULOSKELETAL:  No edema; No deformity  SKIN: Warm and dry NEUROLOGIC:  Alert and oriented x 3 PSYCHIATRIC:  Normal affect   ASSESSMENT:    1. Coronary artery disease involving native coronary artery of native heart without angina pectoris   2. Primary hypertension   3. Mixed hyperlipidemia    PLAN:    In order of problems listed above:  CAD, chest CT 4/21 with LM, LAD, RCA calcifications..  Echo 08/2021 EF 60 to 65%..  Denies chest pain.  Continue aspirin 81 mg, Lipitor to 20 mg daily.  Previous Myoview with no ischemia. Hypertension, BP controlled, continue losartan-hydrochlorothiazide 100-25 mg daily. Mixed hyperlipidemia, triglycerides elevated.  Increase Lipitor to 40 mg daily.  Continue fenofibrate.  Recheck lipid panel in about 6 months-consider obtain with PCP.  Follow-up in 12 months   This note was generated in  part or whole with voice recognition software. Voice recognition is usually quite accurate but there are transcription errors that can and very often do occur. I apologize for any typographical errors that were not detected and corrected.  Medication Adjustments/Labs and Tests Ordered: Current medicines are reviewed at length with the patient today.  Concerns regarding medicines are outlined above.  Orders Placed This Encounter  Procedures   EKG 12-Lead   Meds ordered this encounter  Medications   atorvastatin (LIPITOR) 40 MG tablet    Sig: Take 1 tablet (40 mg total) by mouth daily.    Dispense:  90 tablet    Refill:  3    Patient Instructions  Medication Instructions:  Your physician has recommended you make the following change in your medication:   START: Lipitor 40 mg daily  *If you need a refill on your cardiac medications before your next appointment, please call your pharmacy*   Lab Work: None If you have labs (blood work) drawn today and your tests are completely normal, you will receive your results only by: MyChart Message (if you have MyChart) OR A paper copy in the mail If you have any lab test that is abnormal or we need to change your treatment, we will call you to review the results.   Testing/Procedures: None   Follow-Up: At [facility], you and your health needs are our priority.  As part of our continuing mission to provide you with exceptional heart care, we have created designated Provider Care Teams.  These Care Teams include your primary Cardiologist (physician) and Advanced Practice Providers (APPs -  Physician Assistants and Nurse Practitioners) who all work together to provide you with the care you need, when you  need it.  We recommend signing up for the patient portal called "MyChart".  Sign up information is provided on this After Visit Summary.  MyChart is used to connect with patients for Virtual Visits (Telemedicine).  Patients are able  to view lab/test results, encounter notes, upcoming appointments, etc.  Non-urgent messages can be sent to your provider as well.   To learn more about what you can do with MyChart, go to [email or web address].    Your next appointment:   1 year(s)  Provider:   Dr. Myriam Forehand - Sandie Ano  Other Instructions None       Signed, Debbe Odea, MD  11/17/2023 12:45 PM     [facility]

## 2023-11-19 DIAGNOSIS — H43813 Vitreous degeneration, bilateral: Secondary | ICD-10-CM | POA: Diagnosis not present

## 2023-11-19 DIAGNOSIS — E113393 Type 2 diabetes mellitus with moderate nonproliferative diabetic retinopathy without macular edema, bilateral: Secondary | ICD-10-CM | POA: Diagnosis not present

## 2023-11-19 DIAGNOSIS — H353131 Nonexudative age-related macular degeneration, bilateral, early dry stage: Secondary | ICD-10-CM | POA: Diagnosis not present

## 2023-11-19 DIAGNOSIS — M3501 Sicca syndrome with keratoconjunctivitis: Secondary | ICD-10-CM | POA: Diagnosis not present

## 2023-11-19 LAB — HM DIABETES EYE EXAM

## 2023-11-22 ENCOUNTER — Other Ambulatory Visit: Payer: Self-pay | Admitting: Family Medicine

## 2023-11-24 ENCOUNTER — Telehealth: Payer: Self-pay | Admitting: *Deleted

## 2023-11-24 DIAGNOSIS — E113293 Type 2 diabetes mellitus with mild nonproliferative diabetic retinopathy without macular edema, bilateral: Secondary | ICD-10-CM

## 2023-11-24 NOTE — Telephone Encounter (Signed)
 Last office visit 10/17/2023 for Pre-Syncope.  Last refilled 12/24/2022 for #6 with 2 refills.  Next Appt: 12/18/23 for DM.

## 2023-11-24 NOTE — Telephone Encounter (Signed)
-----   Message from Alvina Chou sent at 11/24/2023  3:29 PM EDT ----- Regarding: Lab orders for [facility], 4.17.25 Lab orders for f/u appt

## 2023-11-25 ENCOUNTER — Encounter: Payer: Self-pay | Admitting: Family Medicine

## 2023-11-28 ENCOUNTER — Other Ambulatory Visit: Payer: Self-pay

## 2023-11-28 MED ORDER — FENOFIBRATE 145 MG PO TABS: 145.0000 mg | ORAL_TABLET | Freq: Every day | ORAL | 3 refills | Status: DC

## 2023-12-05 DIAGNOSIS — S76011A Strain of muscle, fascia and tendon of right hip, initial encounter: Secondary | ICD-10-CM | POA: Diagnosis not present

## 2023-12-05 DIAGNOSIS — Z96651 Presence of right artificial knee joint: Secondary | ICD-10-CM | POA: Diagnosis not present

## 2023-12-05 DIAGNOSIS — M76891 Other specified enthesopathies of right lower limb, excluding foot: Secondary | ICD-10-CM | POA: Diagnosis not present

## 2023-12-09 ENCOUNTER — Other Ambulatory Visit: Payer: Self-pay | Admitting: Student

## 2023-12-09 ENCOUNTER — Ambulatory Visit
Admission: RE | Admit: 2023-12-09 | Discharge: 2023-12-09 | Disposition: A | Discharge status: Home / Self Care | Source: Ambulatory Visit | Attending: Student | Admitting: Student

## 2023-12-09 ENCOUNTER — Ambulatory Visit
Admission: RE | Admit: 2023-12-09 | Discharge: 2023-12-09 | Disposition: A | Discharge status: Home / Self Care | Attending: Student | Admitting: Student

## 2023-12-09 DIAGNOSIS — M2170 Unequal limb length (acquired), unspecified site: Secondary | ICD-10-CM | POA: Insufficient documentation

## 2023-12-09 DIAGNOSIS — Z96653 Presence of artificial knee joint, bilateral: Secondary | ICD-10-CM | POA: Diagnosis not present

## 2023-12-09 DIAGNOSIS — Z471 Aftercare following joint replacement surgery: Secondary | ICD-10-CM | POA: Diagnosis not present

## 2023-12-09 DIAGNOSIS — Z96641 Presence of right artificial hip joint: Secondary | ICD-10-CM | POA: Diagnosis not present

## 2023-12-09 NOTE — Therapy (Unsigned)
 OUTPATIENT PHYSICAL THERAPY EVALUATION   Patient Name: Katie [name] MRN: [id number] DOB:05-26-1946, 78 y.o., female Today's Date: 12/10/2023  END OF SESSION:  PT End of Session - 12/10/23 1218     Visit Number 1    Number of Visits 20    Date for PT Re-Evaluation 03/03/24    Authorization Type [employer]IC[employer] reporting period from 12/10/2023    Authorization Time Period auth 20 visits  4/16-6/20  Authorization #[id number]    Authorization - Visit Number 1    Authorization - Number of Visits 20    Progress Note Due on Visit 10    PT Start Time 1036    PT Stop Time 1127    PT Time Calculation (min) 51 min    Activity Tolerance Patient tolerated treatment well    Behavior During Therapy [facility] for tasks assessed/performed             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, small, not treating-watching , it is now stented   Blood transfusion without reported diagnosis    Cancer (HCC)    breast 08/2018   Coronary artery disease    Diverticulitis    DJD (degenerative joint disease)    DM type 2 (diabetes mellitus, type 2) (HCC)    Family history of breast cancer    Fibrocystic breast disease    GERD (gastroesophageal reflux disease)    History of chicken pox    Hypercholesteremia    Hypertension    Lichen planopilaris    Neuromuscular disorder (HCC)    Sleep apnea    cpap   Urinary incontinence    Vertigo 12/2019   1 episode   Past Surgical History:  Procedure Laterality Date   BREAST IMPLANT REMOVAL Bilateral 03/08/2019   Procedure: Removal of Bilateral Breast Implants;  Surgeon: Emelia Loron, MD;  Location: [facility] OR;  Service: General;  Laterality: Bilateral;   BREAST SURGERY  02/1975   Left Breast Biopsy-Fibrocystic Disease   BREAST SURGERY  10/1981   Bialteral Capsulectomy Breast Surgery   BREAST SURGERY  07/1998   Replace Bilateral Silicone Implants   BREAST SURGERY  01/1976   Bilater breast surgery to  remove fibrocystic tissue and replace with silicone implants   CATARACT EXTRACTION W/PHACO Right 02/08/2020   Procedure: CATARACT EXTRACTION PHACO AND INTRAOCULAR LENS PLACEMENT (IOC) RIGHT DIABETIC;  Surgeon: Galen Manila, MD;  Location: [facility];  Service: Ophthalmology;  Laterality: Right;  3.55 0:33.7   CATARACT EXTRACTION W/PHACO Left 02/29/2020   Procedure: CATARACT EXTRACTION PHACO AND INTRAOCULAR LENS PLACEMENT (IOC) LEFT DIABETIC 2.93  00:27.1;  Surgeon: Galen Manila, MD;  Location: [facility];  Service: Ophthalmology;  Laterality: Left;  Diabetic - oral meds   CHOLECYSTECTOMY N/A 07/08/2016   Procedure: LAPAROSCOPIC CHOLECYSTECTOMY;  Surgeon: Ricarda Frame, MD;  Location: [facility] ORS;  Service: General;  Laterality: N/A;   COLONOSCOPY WITH PROPOFOL N/A 11/07/2017   Procedure: COLONOSCOPY WITH PROPOFOL;  Surgeon: Wyline Mood, MD;  Location: [facility] END[facility];  Service: Gastroenterology;  Laterality: N/A;   DILATION AND CURETTAGE OF UTERUS  01/1994   ESOPHAGOGASTRODUODENOSCOPY (EGD) WITH PROPOFOL N/A 12/05/2016   Procedure: ESOPHAGOGASTRODUODENOSCOPY (EGD) WITH PROPOFOL;  Surgeon: Wyline Mood, MD;  Location: [facility] ENDOSCOPY;  Service: Endoscopy;  Laterality: N/A;   FINGER SURGERY  2001 & 2003   Trigger Finger   FOOT SURGERY  1980   To Relieve Pinched Nerve  FOOT SURGERY  07/2008   Left Foot-Plantar Fasciitis   JOINT REPLACEMENT  04/19/2013   Hip Replacement-Right   MASTECTOMY     double   MASTECTOMY W/ SENTINEL NODE BIOPSY Bilateral 03/08/2019   Procedure: BILATERAL MASTECTOMIES WITH LEFT AXILLARY SENTINEL LYMPH NODE BIOPSY AND BLUE DYE INJECTION;  Surgeon: Enid Harry, MD;  Location: [facility] OR;  Service: General;  Laterality: Bilateral;   PARTIAL KNEE ARTHROPLASTY Right 12/31/2022   Procedure: RIGHT PARTIAL KNEE ARTHROPLASTY;  Surgeon: Elner Hahn, MD;  Location: [facility] ORS;  Service: Orthopedics;  Laterality: Right;   PLACEMENT OF BREAST IMPLANTS   12/26/2004   Replace Failed Implants   REPLACEMENT TOTAL KNEE Left    2019-   RHINOPLASTY  03/1964   To Correct Deviated Septum   Patient Active Problem List   Diagnosis Date Noted   Pre-syncope 11/06/2023   Hypercalcemia 09/10/2023   Acute cough 08/07/2023   Chronic cough 05/27/2023   Reactive airways dysfunction syndrome (HCC) 05/27/2023   Acute diverticulitis 03/21/2023   Abdominal pain, left lower quadrant 03/16/2023   Burning with urination 03/07/2023   Overweight (BMI 25.0-29.9) 03/05/2022   Elevated ferritin 03/23/2021   Decreased GFR 02/23/2021   Vitamin B 12 deficiency 02/23/2021   Leukopenia 02/23/2021   Bronchiectasis without complication (HCC) 01/26/2020   Aortic atherosclerosis (HCC) 12/23/2019   Atherosclerosis of coronary artery of native heart without angina pectoris 12/23/2019   Peripheral edema 12/22/2019   Chemotherapy-induced peripheral neuropathy (HCC) 10/29/2019   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   Anemia 10/15/2018   Diabetic retinopathy (HCC) 09/29/2018   Family history of leukemia 09/16/2018   Family history of breast cancer    Malignant neoplasm of upper-outer quadrant of left breast in female, 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/11/2018   Family history of breast cancer in sister 08/28/2018   Diverticular disease 10/17/2017   Primary osteoarthritis of right knee 03/07/2017   HSV-1 (herpes simplex virus 1) infection, 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   Vitamin D deficiency 10/11/2016   Family history of thyroid disorder 04/04/2015   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   Counseling regarding end of life decision making 10/04/2014   OA (osteoarthritis) of neck 08/02/2014   History of duodenal ulcer 08/02/2014   Seasonal allergies 08/02/2014   Hypertension associated with diabetes (HCC) 08/02/2014   Hyperlipidemia associated with type 2 diabetes mellitus (HCC) 08/02/2014   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   History of joint replacement 03/01/2014    Diabetes mellitus type 2 with retinopathy (HCC) 11/07/2009    PCP: Judithann Novas, MD  REFERRING PROVIDER: Rojelio Clement, PA   REFERRING DIAG: status post right partial knee replacement, hamstring tendinosis of right thigh, strain of right hip adductor muscle  THERAPY DIAG:  Pain in right thigh  Chronic pain of right knee  Rationale for Evaluation and Treatment: Rehabilitation  ONSET DATE: September/October 2024  SUBJECTIVE:   SUBJECTIVE STATEMENT: Patient states she had a R partial knee replacement in May 2024. She had PT for it and it has recovered well. She is unsure when she started having pain in the back of her right knee, but it was after she recovered from the knee replacement. She thinks it might have started about September/October 2024. The pain there gradually started. The pain is there at night when she twists or turns her whole body she has a sharp pain in the back of her knee. She states it feels pretty good today. The pain is there probably a few minutes when it happens. It is  a sharp pain that gradually goes away.   She has some uneven leg length. She started wearing a lift in the left shoe after her R knee replacement maybe in October. She feels like the lift is helping but she still feels a little off. She had a leg lengh xray yesterday.  She did PT for her T[facility] and then was seen there again in the Autumn for her current issue. She stopped going because she went on a cruise. Now [facility] doesn't  take her insurance, so she is coming back to PT here.   She does ave some left SIJ that is part of the back. It is not hurting now after a couple of injections. She denies having pain down her right leg from her back. She had some pain to the L hip region from her back before.   She has a history of R THA on right about 10 years ago. She has neuropathy in her feet from chemotherapy that has recently worsened over the tops of both feet.    Chart review  12/09/2023: patient reported being symptom free at 08/03/2024 following Partial R knee arthroplasty on 12/31/2022 by Dr. Daun Epstein. Leg length xrays ordered and performed but not resulted on 12/09/2023.   Right hip arthroplasty (no date)  From Rojelio Clement, PA note on 09/26/2023. "In the past the patient has been diagnosed with degenerative changes to her left hip in addition to greater trochanteric bursitis "  Has a history of pre-syncope and SIJ injections.   Intracranial ICAs (MRI with angiogram on 07/10/2023):  1. 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  2.  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   PERTINENT HISTORY: Patient is a 78 y.o. female who presents to outpatient physical therapy with a referral for medical diagnosis status post right partial knee replacement, hamstring tendinosis of right thigh, strain of right hip adductor muscle. This patient's chief complaints consist of pain at the distal medial right thigh, leading to the following functional deficits: difficulty with sleeping, exercising, walking, going up and down stairs,  doing ADLs and IADLs, lifting an object of the floor, performing heavy activities around the home, rolling over in bed. Relevant past medical history and comorbidities include the following: she has Diabetes mellitus type 2 with retinopathy (HCC); OA (osteoarthritis) of neck; History of duodenal ulcer; Seasonal allergies; Hypertension associated with diabetes (HCC); Hyperlipidemia associated with type 2 diabetes mellitus (HCC); Lichen plano-pilaris; Osteoporosis of forearm; History of joint replacement; Vitamin D deficiency; HSV-1 (herpes simplex virus 1) infection, vaginal; Primary osteoarthritis of right knee; Diverticular disease; Malignant neoplasm of upper-outer quadrant of left breast in female, estrogen receptor positive (HCC); Diabetic  retinopathy (HCC); Anemia; Carotid artery aneurysm (HCC); Chemotherapy-induced peripheral neuropathy (HCC); Peripheral edema; Aortic atherosclerosis (HCC); Atherosclerosis of coronary artery of native heart without angina pectoris; Bronchiectasis without complication (HCC); Decreased GFR; Vitamin B 12 deficiency; Leukopenia; Elevated ferritin; Overweight (BMI 25.0-29.9); Burning with urination; Abdominal pain, left lower quadrant; Acute diverticulitis; Chronic cough; Reactive airways dysfunction syndrome (HCC); Acute cough; Hypercalcemia; and Pre-syncope on their problem list. she  has a past medical history of Allergy, Anemia, Aneurysm (HCC), Blood transfusion without reported diagnosis, Cancer (HCC), Coronary artery disease, Diverticulitis, DJD (degenerative joint disease), DM type 2 (diabetes mellitus, type 2) (HCC), F Fibrocystic breast disease, GERD (gastroesophageal reflux disease), History of chicken pox, Hypercholesteremia, Hypertension, Lichen planopilaris, Neuromuscular disorder (HCC), Sleep apnea,  Urinary incontinence, and Vertigo (12/2019). she  has a past surgical history that includes Placement of breast implants (12/26/2004); Dilation and curettage of uterus (01/1994); Finger surgery (2001 & 2003); Breast surgery (02/1975); Rhinoplasty (03/1964); Foot surgery (1980); Breast surgery (10/1981); Breast surgery (07/1998); Foot surgery (07/2008); Breast surgery (01/1976); Cholecystectomy (N/A, 07/08/2016); Joint replacement R THA (04/19/2013);  Mastectomy w/ sentinel node biopsy (Bilateral, 03/08/2019); Breast implant removal (Bilateral, 03/08/2019); Replacement total knee (Left); Cataract extraction w/PHACO (Right, 02/08/2020); Cataract extraction w/PHACO (Left, 02/29/2020); Mastectomy; and Partial knee arthroplasty (Right, 12/31/2022). Patient denies hx of stroke, seizures, unexplained weight loss, unexplained changes in bowel or bladder problems, unexplained stumbling or dropping things, and spinal  surgery. Has osteoporosis in forearm (spine steady), almost 5 years out from breast cancer, has some lung damage from chemo, and has CAD but not cardiac events .  Exercise history: Usually goes to the gym pretty regularly with her husband. Mostly does the [employer] since her knee surgery. Also has done the treadmill. Avoids the machines if her concordant pain is actively hurting.     PAIN: Are you having pain? Yes NPRS: Current: 0/10,  Best: 0/10, Worst: 7/10. Pain location: medial side of distal posterior thigh Pain description: sharp then gradually fades, lasts a few minutes.  Aggravating factors: turning over in the bed or lifting her R leg up when she is laying on her back.  Relieving factors: puts voltaren gel on it (unsure if it helps).      FUNCTIONAL LIMITATIONS: difficulty with sleeping, exercising, walking, going up and down stairs,  doing ADLs and IADLs, lifting an object of the floor, performing heavy activities around the home, rolling over in bed.  LEISURE: goes to the gym, traveling, exercising, going to the movies, pretty active for age.    PRECAUTIONS: None, used to be not allowed to lift anything over 30# due to aneurism that was repaired  WEIGHT BEARING RESTRICTIONS: No  FALLS:  Has patient fallen in last 6 months? 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   NEXT MD VISIT:   OBJECTIVE  DIAGNOSTIC FINDINGS:  Leg Length Discrepancy Xray on 12/09/2023 CLINICAL DATA:  Leg length inequality.  Leg length discrepancy.   EXAM: BONE LENGTH   COMPARISON:  None Available.   FINDINGS: Status post total right hip arthroplasty, right knee medial unicompartmental arthroplasty, and total left knee arthroplasty. No perihardware lucency is seen to indicate hardware failure or loosening.   Mild bilateral distal medial malleolar and moderate left  and mild to moderate right lateral talar process peripheral degenerative osteophytes. Small corticated ossicle between the lateral talar process and the distal fibula on the left, chronic.   From the superior aspect of the femoral head to the distal tibial plafond and, the left leg measures approximately 87.9 cm. From the superior aspect of the right femoral head prosthesis to the distal tibial find, the right leg measures approximately 89.3 cm. The superior aspect of the right femoral head prosthesis is approximately 12 mm superior to the superior aspect of the native left femoral head.   No acute fracture dislocation.   IMPRESSION: 1. Status post total right hip arthroplasty, right knee medial unicompartmental arthroplasty, and total left knee arthroplasty. 2. When comparing the superior aspect of the right femoral head prosthesis and the superior aspect of the native left femoral head, the right leg is approximately 1.4 cm longer than  the left.  SELF-REPORTED FUNCTION Lower Extremity Functional Scale (LEFS): 64 (range 0-80)  OBSERVATION/INSPECTION Anthropometrics Tremor: none Body composition: BMI 28.9 on 11/17/23 Functional Mobility Transfers: sit <> stand I Gait: grossly WFL for household and short community ambulation. More detailed gait analysis deferred to later date as needed.   SPINE MOTION LUMBAR SPINE AROM *Indicates pain Flexion: fingers to distal third of shin, concordant pain at end range  Extension: 50% Side Flexion:   R fingers to knee joint line, concordant pain with overpressure  L fingers to knee joint line Rotation:  R 50% L 50% L SIJ pain with overpressure  NEUROLOGICAL Deep Tendon Reflexes R/L  2+/2+ Quadriceps reflex (L4) 0+/0+ Achilles reflex (S1)   PERIPHERAL JOINT MOTION (in degrees)  PASSIVE RANGE OF MOTION (PROM) B LE appear grossly WFL for basic mobility  MUSCLE PERFORMANCE (MMT):  *Indicates pain 12/10/23 Date Date  Joint/Motion  R/L R/L R/L  Hip     Flexion (L1, L2) 4/4 / /  Extension (knee ext) / / /  Extension (knee flex) / / /  Abduction B WFL / /  Adduction 4/4 / /  External rotation / / /  Internal rotation  / / /  Knee     Extension (L3) 5/5 / /  Flexion (S2) 5/5 / /  Ankle/Foot     Dorsiflexion (L4) 5/5 / /  Great toe extension (L5) 4/4 / /  Eversion (S1) 5/5 / /  Plantarflexion (S1) 4+/5 / /  Comments:  12/10/23: concordant pain with R tibial IR knee flexion  SPECIAL TESTS:  LOWER LIMB NEURODYNAMIC TESTS Slump Test (entire nervous system) R = reproduces pain but worse with neck extension in lumbar flexed and extended positions.  L = negative   ACCESSORY MOTION: No reproduction of pain with CPA to lumbar spine  PALPATION: TTP at posterior right knee in popliteal fossa and medial hamstrings.  Most TTP with concordant pain at adductor group                                                                                                                               TREATMENT   PATIENT EDUCATION:  Education details: Education on diagnosis, prognosis, POC, anatomy and physiology of current condition.  Person educated: Patient Education method: Explanation Education comprehension: verbalized understanding and needs further education  HOME EXERCISE PROGRAM: TBD  ASSESSMENT:  CLINICAL IMPRESSION: Patient is a 78 y.o. female referred to outpatient physical therapy with a medical diagnosis of status post right partial knee replacement, hamstring tendinosis of right thigh, strain of right hip adductor muscle who presents with signs and symptoms consistent with posterior medial R thigh pain. It does not appear to be referring from the low back based on negative slump test and reflexes. Most TTP at adductors but also tender at distal medial hamstrings. Patient presents with significant pain, activity tolerance, knowledge, decreased participation in exercise (not currently meeting physical activity  recommendations for  health) impairments that are limiting ability to complete sleeping, exercising, walking, going up and down stairs,  doing ADLs and IADLs, lifting an object of the floor, performing heavy activities around the home, rolling over in bed without difficulty. Patient will benefit from skilled physical therapy intervention to address current body structure impairments and activity limitations to improve function and work towards goals set in current POC in order to return to prior level of function or maximal functional improvement.    OBJECTIVE IMPAIRMENTS: decreased activity tolerance, decreased knowledge of condition, decreased mobility, difficulty walking, and pain.   ACTIVITY LIMITATIONS: lifting, sleeping, and stairs  PARTICIPATION LIMITATIONS: community activity and   difficulty with sleeping, exercising, walking, going up and down stairs,  doing ADLs and IADLs, lifting an object of the floor, performing heavy activities around the home, rolling over in bed  PERSONAL FACTORS: Age, Fitness, Past/current experiences, Time since onset of injury/illness/exacerbation, and 3+ comorbidities:   Diabetes mellitus type 2 with retinopathy (HCC); OA (osteoarthritis) of neck; History of duodenal ulcer; Seasonal allergies; Hypertension associated with diabetes (HCC); Hyperlipidemia associated with type 2 diabetes mellitus (HCC); Lichen plano-pilaris; Osteoporosis of forearm; History of joint replacement; Vitamin D deficiency; HSV-1 (herpes simplex virus 1) infection, vaginal; Primary osteoarthritis of right knee; Diverticular disease; Malignant neoplasm of upper-outer quadrant of left breast in female, estrogen receptor positive (HCC); Diabetic retinopathy (HCC); Anemia; Carotid artery aneurysm (HCC); Chemotherapy-induced peripheral neuropathy (HCC); Peripheral edema; Aortic atherosclerosis (HCC); Atherosclerosis of coronary artery of native heart without angina pectoris; Bronchiectasis without  complication (HCC); Decreased GFR; Vitamin B 12 deficiency; Leukopenia; Elevated ferritin; Overweight (BMI 25.0-29.9); Burning with urination; Abdominal pain, left lower quadrant; Acute diverticulitis; Chronic cough; Reactive airways dysfunction syndrome (HCC); Acute cough; Hypercalcemia; and Pre-syncope on their problem list. she  has a past medical history of Allergy, Anemia, Aneurysm (HCC), Blood transfusion without reported diagnosis, Cancer (HCC), Coronary artery disease, Diverticulitis, DJD (degenerative joint disease), DM type 2 (diabetes mellitus, type 2) (HCC), F Fibrocystic breast disease, GERD (gastroesophageal reflux disease), History of chicken pox, Hypercholesteremia, Hypertension, Lichen planopilaris, Neuromuscular disorder (HCC), Sleep apnea, Urinary incontinence, and Vertigo (12/2019). she  has a past surgical history that includes Placement of breast implants (12/26/2004); Dilation and curettage of uterus (01/1994); Finger surgery (2001 & 2003); Breast surgery (02/1975); Rhinoplasty (03/1964); Foot surgery (1980); Breast surgery (10/1981); Breast surgery (07/1998); Foot surgery (07/2008); Breast surgery (01/1976); Cholecystectomy (N/A, 07/08/2016); Joint replacement R THA (04/19/2013);  Mastectomy w/ sentinel node biopsy (Bilateral, 03/08/2019); Breast implant removal (Bilateral, 03/08/2019); Replacement total knee (Left); Cataract extraction w/PHACO (Right, 02/08/2020); Cataract extraction w/PHACO (Left, 02/29/2020); Mastectomy; and Partial knee arthroplasty (Right, 12/31/2022) are also affecting patient's functional outcome.   REHAB POTENTIAL: Good  CLINICAL DECISION MAKING: Evolving/moderate complexity  EVALUATION COMPLEXITY: Moderate   GOALS: Goals reviewed with patient? No  SHORT TERM GOALS: Target date: 12/24/2023  Patient will be independent with initial home exercise program for self-management of symptoms. Baseline: Initial HEP to be provided at visit 2 as appropriate  (12/10/23); Goal status: INITIAL   LONG TERM GOALS: Target date: 03/03/2024  Patient will be independent with a long-term home exercise program for self-management of symptoms.  Baseline: Initial HEP to be provided at visit 2 as appropriate (12/10/23); Goal status: INITIAL  2.  Patient will demonstrate improvement in Patient Specific Functional Scale (PSFS) of equal or greater than 8/10 points to reflect clinically significant improvement in patient's most valued functional activities. Baseline: to be measured at visit 2 as appropriate (12/10/23); Goal status: INITIAL  3.  Patient will demonstrate the ability to go up and down 12 steps without concordant pain to improve her household and community mobility.  Baseline: pain with stairs (12/10/23); Goal status: INITIAL  4.  Patient will report NPRS equal or less than 3/10 during functional activities during the last 2 weeks to improve their abilitly to complete community, work and/or recreational activities with less limitation. Baseline: 7/10 (12/10/23); Goal status: INITIAL    PLAN:  PT FREQUENCY: 1-2x/week  PT DURATION: 8-12 weeks  PLANNED INTERVENTIONS: 97164- PT Re-evaluation, 97750- Physical Performance Testing, 97110-Therapeutic exercises, 97530- Therapeutic activity, [id number]- Neuromuscular re-education, 97535- Self Care, 09811- Manual therapy, [id number]- Electrical stimulation (unattended), Patient/Family education, Stair training, Dry Needling, Joint mobilization, Spinal mobilization, Cryotherapy, and Moist heat  PLAN FOR NEXT SESSION: update HEP as appropriate, progressive loading to R adductor and hamstring group as tolerated, further exam as needed. Education. Manual therapy as appropriate.    Carilyn Charles. Artemio Larry, PT, DPT 12/10/23, 8:01 PM  [facility] [address] [place], [place] [address] P: [phone] I F: [phone]

## 2023-12-10 ENCOUNTER — Ambulatory Visit: Attending: Student | Admitting: Physical Therapy

## 2023-12-10 ENCOUNTER — Encounter: Payer: Self-pay | Admitting: Physical Therapy

## 2023-12-10 DIAGNOSIS — M79651 Pain in right thigh: Secondary | ICD-10-CM | POA: Diagnosis not present

## 2023-12-10 DIAGNOSIS — M25561 Pain in right knee: Secondary | ICD-10-CM | POA: Insufficient documentation

## 2023-12-10 DIAGNOSIS — G8929 Other chronic pain: Secondary | ICD-10-CM | POA: Insufficient documentation

## 2023-12-11 ENCOUNTER — Encounter: Payer: Self-pay | Admitting: Family Medicine

## 2023-12-11 ENCOUNTER — Other Ambulatory Visit (INDEPENDENT_AMBULATORY_CARE_PROVIDER_SITE_OTHER): Payer: Medicare PPO

## 2023-12-11 DIAGNOSIS — E113293 Type 2 diabetes mellitus with mild nonproliferative diabetic retinopathy without macular edema, bilateral: Secondary | ICD-10-CM

## 2023-12-11 LAB — COMPREHENSIVE METABOLIC PANEL WITH GFR
ALT: 24 U/L (ref 0–35)
AST: 31 U/L (ref 0–37)
Albumin: 4.4 g/dL (ref 3.5–5.2)
Alkaline Phosphatase: 34 U/L — ABNORMAL LOW (ref 39–117)
BUN: 22 mg/dL (ref 6–23)
CO2: 30 meq/L (ref 19–32)
Calcium: 10.3 mg/dL (ref 8.4–10.5)
Chloride: 98 meq/L (ref 96–112)
Creatinine, Ser: 1.27 mg/dL — ABNORMAL HIGH (ref 0.40–1.20)
GFR: 40.36 mL/min — ABNORMAL LOW (ref >60.00)
Glucose, Bld: 168 mg/dL — ABNORMAL HIGH (ref 70–99)
Potassium: 3.5 meq/L (ref 3.5–5.1)
Sodium: 137 meq/L (ref 135–145)
Total Bilirubin: 0.6 mg/dL (ref 0.2–1.2)
Total Protein: 7.2 g/dL (ref 6.0–8.3)

## 2023-12-11 LAB — HEMOGLOBIN A1C: Hgb A1c MFr Bld: 7.3 % — ABNORMAL HIGH (ref 4.6–6.5)

## 2023-12-11 NOTE — Progress Notes (Signed)
 No critical labs need to be addressed urgently. We will discuss labs in detail at upcoming office visit.

## 2023-12-12 DIAGNOSIS — G4733 Obstructive sleep apnea (adult) (pediatric): Secondary | ICD-10-CM | POA: Diagnosis not present

## 2023-12-15 ENCOUNTER — Ambulatory Visit: Admitting: Physical Therapy

## 2023-12-15 ENCOUNTER — Encounter: Payer: Self-pay | Admitting: Physical Therapy

## 2023-12-15 VITALS — BP 121/48 | HR 73

## 2023-12-15 DIAGNOSIS — M79651 Pain in right thigh: Secondary | ICD-10-CM

## 2023-12-15 DIAGNOSIS — M25561 Pain in right knee: Secondary | ICD-10-CM | POA: Diagnosis not present

## 2023-12-15 DIAGNOSIS — G8929 Other chronic pain: Secondary | ICD-10-CM

## 2023-12-15 NOTE — Therapy (Addendum)
 OUTPATIENT PHYSICAL THERAPY TREATMENT   Patient Name: TAKASHA VETERE MRN: [id number] DOB:1946-06-26, 78 y.o., female Today's Date: 12/15/2023  END OF SESSION:  PT End of Session - 12/15/23 0908     Visit Number 2    Number of Visits 20    Date for PT Re-Evaluation 03/03/24    Authorization Type [employer]IC[employer] reporting period from 12/10/2023    Authorization Time Period auth 20 visits  4/16-6/20  Authorization #[id number]    Authorization - Visit Number 2    Authorization - Number of Visits 20    Progress Note Due on Visit 10    PT Start Time 0901    PT Stop Time 0940    PT Time Calculation (min) 39 min    Activity Tolerance Patient tolerated treatment well    Behavior During Therapy [facility] for tasks assessed/performed              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, small, not treating-watching , it is now stented   Blood transfusion without reported diagnosis    Cancer (HCC)    breast 08/2018   Coronary artery disease    Diverticulitis    DJD (degenerative joint disease)    DM type 2 (diabetes mellitus, type 2) (HCC)    Family history of breast cancer    Fibrocystic breast disease    GERD (gastroesophageal reflux disease)    History of chicken pox    Hypercholesteremia    Hypertension    Lichen planopilaris    Neuromuscular disorder (HCC)    Sleep apnea    cpap   Urinary incontinence    Vertigo 12/2019   1 episode   Past Surgical History:  Procedure Laterality Date   BREAST IMPLANT REMOVAL Bilateral 03/08/2019   Procedure: Removal of Bilateral Breast Implants;  Surgeon: Enid Harry, MD;  Location: [facility] OR;  Service: General;  Laterality: Bilateral;   BREAST SURGERY  02/1975   Left Breast Biopsy-Fibrocystic Disease   BREAST SURGERY  10/1981   Bialteral Capsulectomy Breast Surgery   BREAST SURGERY  07/1998   Replace Bilateral Silicone Implants   BREAST SURGERY  01/1976   Bilater breast surgery  to remove fibrocystic tissue and replace with silicone implants   CATARACT EXTRACTION W/PHACO Right 02/08/2020   Procedure: CATARACT EXTRACTION PHACO AND INTRAOCULAR LENS PLACEMENT (IOC) RIGHT DIABETIC;  Surgeon: Clair Crews, MD;  Location: [facility];  Service: Ophthalmology;  Laterality: Right;  3.55 0:33.7   CATARACT EXTRACTION W/PHACO Left 02/29/2020   Procedure: CATARACT EXTRACTION PHACO AND INTRAOCULAR LENS PLACEMENT (IOC) LEFT DIABETIC 2.93  00:27.1;  Surgeon: Clair Crews, MD;  Location: [facility];  Service: Ophthalmology;  Laterality: Left;  Diabetic - oral meds   CHOLECYSTECTOMY N/A 07/08/2016   Procedure: LAPAROSCOPIC CHOLECYSTECTOMY;  Surgeon: Gwyndolyn Lerner, MD;  Location: [facility] ORS;  Service: General;  Laterality: N/A;   COLONOSCOPY WITH PROPOFOL  N/A 11/07/2017   Procedure: COLONOSCOPY WITH PROPOFOL ;  Surgeon: Luke Salaam, MD;  Location: [facility] END[facility];  Service: Gastroenterology;  Laterality: N/A;   DILATION AND CURETTAGE OF UTERUS  01/1994   ESOPHAGOGASTRODUODENOSCOPY (EGD) WITH PROPOFOL  N/A 12/05/2016   Procedure: ESOPHAGOGASTRODUODENOSCOPY (EGD) WITH PROPOFOL ;  Surgeon: Luke Salaam, MD;  Location: [facility] ENDOSCOPY;  Service: Endoscopy;  Laterality: N/A;   FINGER SURGERY  2001 & 2003   Trigger Finger   FOOT SURGERY  1980   To Relieve Pinched Nerve  FOOT SURGERY  07/2008   Left Foot-Plantar Fasciitis   JOINT REPLACEMENT  04/19/2013   Hip Replacement-Right   MASTECTOMY     double   MASTECTOMY W/ SENTINEL NODE BIOPSY Bilateral 03/08/2019   Procedure: BILATERAL MASTECTOMIES WITH LEFT AXILLARY SENTINEL LYMPH NODE BIOPSY AND BLUE DYE INJECTION;  Surgeon: Enid Harry, MD;  Location: [facility] OR;  Service: General;  Laterality: Bilateral;   PARTIAL KNEE ARTHROPLASTY Right 12/31/2022   Procedure: RIGHT PARTIAL KNEE ARTHROPLASTY;  Surgeon: Elner Hahn, MD;  Location: [facility] ORS;  Service: Orthopedics;  Laterality: Right;   PLACEMENT OF BREAST IMPLANTS   12/26/2004   Replace Failed Implants   REPLACEMENT TOTAL KNEE Left    2019-   RHINOPLASTY  03/1964   To Correct Deviated Septum   Patient Active Problem List   Diagnosis Date Noted   Pre-syncope 11/06/2023   Hypercalcemia 09/10/2023   Acute cough 08/07/2023   Chronic cough 05/27/2023   Reactive airways dysfunction syndrome (HCC) 05/27/2023   Acute diverticulitis 03/21/2023   Abdominal pain, left lower quadrant 03/16/2023   Burning with urination 03/07/2023   Overweight (BMI 25.0-29.9) 03/05/2022   Elevated ferritin 03/23/2021   Decreased GFR 02/23/2021   Vitamin B 12 deficiency 02/23/2021   Leukopenia 02/23/2021   Bronchiectasis without complication (HCC) 01/26/2020   Aortic atherosclerosis (HCC) 12/23/2019   Atherosclerosis of coronary artery of native heart without angina pectoris 12/23/2019   Peripheral edema 12/22/2019   Chemotherapy-induced peripheral neuropathy (HCC) 10/29/2019   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   Anemia 10/15/2018   Diabetic retinopathy (HCC) 09/29/2018   Family history of leukemia 09/16/2018   Family history of breast cancer    Malignant neoplasm of upper-outer quadrant of left breast in female, 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/11/2018   Family history of breast cancer in sister 08/28/2018   Diverticular disease 10/17/2017   Primary osteoarthritis of right knee 03/07/2017   HSV-1 (herpes simplex virus 1) infection, 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   Vitamin D  deficiency 10/11/2016   Family history of thyroid  disorder 04/04/2015   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   Counseling regarding end of life decision making 10/04/2014   OA (osteoarthritis) of neck 08/02/2014   History of duodenal ulcer 08/02/2014   Seasonal allergies 08/02/2014   Hypertension associated with diabetes (HCC) 08/02/2014   Hyperlipidemia associated with type 2 diabetes mellitus (HCC) 08/02/2014   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   History of joint replacement 03/01/2014    Diabetes mellitus type 2 with retinopathy (HCC) 11/07/2009    PCP: Judithann Novas, MD  REFERRING PROVIDER: Rojelio Clement, PA   REFERRING DIAG: status post right partial knee replacement, hamstring tendinosis of right thigh, strain of right hip adductor muscle  THERAPY DIAG:  Pain in right thigh  Chronic pain of right knee  Rationale for Evaluation and Treatment: Rehabilitation  ONSET DATE: September/October 2024  SUBJECTIVE:   PERTINENT HISTORY: Patient is a 78 y.o. female who presents to outpatient physical therapy with a referral for medical diagnosis status post right partial knee replacement, hamstring tendinosis of right thigh, strain of right hip adductor muscle. This patient's chief complaints consist of pain at the distal medial right thigh, leading to the following functional deficits: difficulty with sleeping, exercising, walking, going up and down stairs,  doing ADLs and IADLs, lifting an object of the floor, performing heavy activities around the home, rolling over in bed. Relevant past medical history and comorbidities include the following: she has Diabetes mellitus type 2 with retinopathy (HCC); OA (osteoarthritis) of  neck; History of duodenal ulcer; Seasonal allergies; Hypertension associated with diabetes (HCC); Hyperlipidemia associated with type 2 diabetes mellitus (HCC); Lichen plano-pilaris; Osteoporosis of forearm; History of joint replacement; Vitamin D  deficiency; HSV-1 (herpes simplex virus 1) infection, vaginal; Primary osteoarthritis of right knee; Diverticular disease; Malignant neoplasm of upper-outer quadrant of left breast in female, estrogen receptor positive (HCC); Diabetic retinopathy (HCC); Anemia; Carotid artery aneurysm (HCC); Chemotherapy-induced peripheral neuropathy (HCC); Peripheral edema; Aortic atherosclerosis (HCC); Atherosclerosis of coronary artery of native heart without angina pectoris; Bronchiectasis without complication (HCC); Decreased GFR;  Vitamin B 12 deficiency; Leukopenia; Elevated ferritin; Overweight (BMI 25.0-29.9); Burning with urination; Abdominal pain, left lower quadrant; Acute diverticulitis; Chronic cough; Reactive airways dysfunction syndrome (HCC); Acute cough; Hypercalcemia; and Pre-syncope on their problem list. she  has a past medical history of Allergy, Anemia, Aneurysm (HCC), Blood transfusion without reported diagnosis, Cancer (HCC), Coronary artery disease, Diverticulitis, DJD (degenerative joint disease), DM type 2 (diabetes mellitus, type 2) (HCC), F Fibrocystic breast disease, GERD (gastroesophageal reflux disease), History of chicken pox, Hypercholesteremia, Hypertension, Lichen planopilaris, Neuromuscular disorder (HCC), Sleep apnea, Urinary incontinence, and Vertigo (12/2019). she  has a past surgical history that includes Placement of breast implants (12/26/2004); Dilation and curettage of uterus (01/1994); Finger surgery (2001 & 2003); Breast surgery (02/1975); Rhinoplasty (03/1964); Foot surgery (1980); Breast surgery (10/1981); Breast surgery (07/1998); Foot surgery (07/2008); Breast surgery (01/1976); Cholecystectomy (N/A, 07/08/2016); Joint replacement R THA (04/19/2013);  Mastectomy w/ sentinel node biopsy (Bilateral, 03/08/2019); Breast implant removal (Bilateral, 03/08/2019); Replacement total knee (Left); Cataract extraction w/PHACO (Right, 02/08/2020); Cataract extraction w/PHACO (Left, 02/29/2020); Mastectomy; and Partial knee arthroplasty (Right, 12/31/2022). Patient denies hx of stroke, seizures, unexplained weight loss, unexplained changes in bowel or bladder problems, unexplained stumbling or dropping things, and spinal surgery. Has osteoporosis in forearm (spine steady), almost 5 years out from breast cancer, has some lung damage from chemo, and has CAD but not cardiac events .  Exercise history: Usually goes to the gym pretty regularly with her husband. Mostly does the Nustep since her knee surgery. Also  has done the treadmill. Avoids the machines if her concordant pain is actively hurting.   SUBJECTIVE STATEMENT: Patient states she found out her leg length discrepancy and tried to adjust her heel lift in the left side, but it made her left hip hurt, so she took it back out. She states her right leg pain was really bad when she turned over in bed. She noticed when she sits with her knee extended in front of her it increases her pain.     PAIN: NPRS: Current: 4/10 Best: 0/10, Worst: 7-8/10. Pain location: medial side of distal posterior thigh Pain description: sharp then gradually fades, lasts a few minutes.  Aggravating factors: turning over in the bed or lifting her R leg up when she is laying on her back.  Relieving factors: puts voltaren gel on it (unsure if it helps).      FUNCTIONAL LIMITATIONS: difficulty with sleeping, exercising, walking, going up and down stairs,  doing ADLs and IADLs, lifting an object of the floor, performing heavy activities around the home, rolling over in bed.  LEISURE: goes to the gym, traveling, exercising, going to the movies, pretty active for age.    PRECAUTIONS: None, used to be not allowed to lift anything over 30# due to aneurism that was repaired  WEIGHT BEARING RESTRICTIONS: No  FALLS:  Has patient fallen in last 6 months? 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   NEXT MD VISIT:   OBJECTIVE  Vitals:   12/15/23 0908  BP: (!) 121/48  Pulse: 73  SpO2: 97%    SELF-REPORTED FUNCTION Patient Specific Functional Scale (PSFS)  Standing up from a sitting position: 4 Rolling over in bed: 4 Stairs: 5 Walking on the treadmill: 5 Average: 4.5/10  MUSCLE PERFORMANCE (MMT):  *Indicates pain 12/10/23 12/15/23 Date  Joint/Motion R/L R/L R/L  Hip        Flexion (L1, L2) 4/4 4/4 /  Extension (knee ext) / 3+*/4 /   Extension (knee flex) / 3/3 /  Abduction B WFL 4+/4* /  Adduction 4/4 4*/4 /  External rotation / 3*/4 /  Internal rotation  / 5/5 /  Knee        Extension (L3) 5/5 / /  Flexion (S2) 5/5 / /  Ankle/Foot        Dorsiflexion (L4) 5/5 / /  Great toe extension (L5) 4/4 / /  Eversion (S1) 5/5 / /  Plantarflexion (S1) 4+/5 / /  Comments:  12/10/23: concordant pain with R tibial IR knee flexion 12/15/23:  L abduction pain over trochanter.  Prone:  R hamstring loading 0-90 painful in neutral and tibial IR, but not tibial ER.   PERIPHERAL JOINT MOTION (in degrees) PASSIVE RANGE OF MOTION (PROM) L hip ER/IR produces glute pain, otherwise WFL  SPECIAL TESTS: LOWER LIMB NEURODYNAMIC TESTS Slump Test  R = positive in lumbar flexion  L = negative in lumbar flexion  PALPATION TTP posterior and medial R hip adductors (worse distally) and distal hamstrings TTP in right sciatic notch region but not radiating                                                                                                                               TREATMENT Therapeutic exercise: therapeutic exercises that incorporate ONE parameter at one or more areas of the body to centralize symptoms, develop strength and endurance, range of motion, and flexibility required for successful completion of functional activities.  Vitals measurement for systems review (see above)  NuStep using bilateral upper and lower extremities. For improved extremity mobility, muscular endurance, and activity tolerance; and to induce the analgesic effect of aerobic exercise, stimulate improved joint nutrition, and prepare body structures and systems for following interventions. Also to reinforce understanding of appropriate exercise intensity to help meet physical activity guidelines for health.  Seat/handle setting: 6/8 6  minutes Level: 3-5 Target SPM: > 80-100 Average SPM: 84 RPE: 7/10 (Slight difficulty holding conversation by end,  with goal of 100 spm and level 3)  Further exam to guide treatment (see above)  Hooklying hamstring bridge 3x45 seconds Added to HEP with handout  Hooklying B hip abduction against towel roll 1x45 seconds Added to HEP  Neuromuscular Re-education: a technique or exercise performed with the goal of improving  the level of communication between the body and the brain, such as for balance, motor control, muscle activation patterns, coordination, desensitization, quality of muscle contraction, proprioception, and/or kinesthetic sense needed for successful and safe completion of functional activities.   Slump test (see above)   PATIENT EDUCATION:  Education details: Education on diagnosis, prognosis, POC, anatomy and physiology of current condition.  Person educated: Patient Education method: Explanation Education comprehension: verbalized understanding and needs further education  HOME EXERCISE PROGRAM: Access Code: [email or web address] URL: [email or web address] Date: 12/15/2023 Prepared by: Alleen Isle  Exercises - Supine Extended Bridge on Heels  - 3 x daily - 1 sets - 3 reps - 45 seconds hold - Supine Hip Adduction Isometric with Ball  - 3 x daily - 1 sets - 3 reps - 45 seconds hold  ASSESSMENT:  CLINICAL IMPRESSION: Patient arrives with similar pain to initial evaluation. Continued with more specific exam of hip musculature and found weakness with hip extension and adduction bilaterally. Patient with concordant pain with R hip adduction and extension. Also had concordant pain with palpation to right adductors and distal hamstrings. She was positive to slump test today but did not strongly change symptoms with head position. Still not completely clear if coming from muscle-tendon unit (medial hamstrings and/or adductor magnus most likely) or nerve root irritation. Started with HEP focused on isometric loading of hamstrings and adductors with core bracing. Patient would benefit  from continued management of limiting condition by skilled physical therapist to address remaining impairments and functional limitations to work towards stated goals and return to PLOF or maximal functional independence.   From initial PT evaluation on 12/10/2023:  Patient is a 78 y.o. female referred to outpatient physical therapy with a medical diagnosis of status post right partial knee replacement, hamstring tendinosis of right thigh, strain of right hip adductor muscle who presents with signs and symptoms consistent with posterior medial R thigh pain. It does not appear to be referring from the low back based on negative slump test and reflexes. Most TTP at adductors but also tender at distal medial hamstrings. Patient presents with significant pain, activity tolerance, knowledge, decreased participation in exercise (not currently meeting physical activity recommendations for health) impairments that are limiting ability to complete sleeping, exercising, walking, going up and down stairs,  doing ADLs and IADLs, lifting an object of the floor, performing heavy activities around the home, rolling over in bed without difficulty. Patient will benefit from skilled physical therapy intervention to address current body structure impairments and activity limitations to improve function and work towards goals set in current POC in order to return to prior level of function or maximal functional improvement.    OBJECTIVE IMPAIRMENTS: decreased activity tolerance, decreased knowledge of condition, decreased mobility, difficulty walking, and pain.   ACTIVITY LIMITATIONS: lifting, sleeping, and stairs  PARTICIPATION LIMITATIONS: community activity and   difficulty with sleeping, exercising, walking, going up and down stairs,  doing ADLs and IADLs, lifting an object of the floor, performing heavy activities around the home, rolling over in bed  PERSONAL FACTORS: Age, Fitness, Past/current experiences, Time since  onset of injury/illness/exacerbation, and 3+ comorbidities:   Diabetes mellitus type 2 with retinopathy (HCC); OA (osteoarthritis) of neck; History of duodenal ulcer; Seasonal allergies; Hypertension associated with diabetes (HCC); Hyperlipidemia associated with type 2 diabetes mellitus (HCC); Lichen plano-pilaris; Osteoporosis of forearm; History of joint replacement; Vitamin D  deficiency; HSV-1 (herpes simplex virus 1) infection, vaginal; Primary osteoarthritis of right knee; Diverticular disease; Malignant neoplasm of upper-outer quadrant  of left breast in female, estrogen receptor positive (HCC); Diabetic retinopathy (HCC); Anemia; Carotid artery aneurysm (HCC); Chemotherapy-induced peripheral neuropathy (HCC); Peripheral edema; Aortic atherosclerosis (HCC); Atherosclerosis of coronary artery of native heart without angina pectoris; Bronchiectasis without complication (HCC); Decreased GFR; Vitamin B 12 deficiency; Leukopenia; Elevated ferritin; Overweight (BMI 25.0-29.9); Burning with urination; Abdominal pain, left lower quadrant; Acute diverticulitis; Chronic cough; Reactive airways dysfunction syndrome (HCC); Acute cough; Hypercalcemia; and Pre-syncope on their problem list. she  has a past medical history of Allergy, Anemia, Aneurysm (HCC), Blood transfusion without reported diagnosis, Cancer (HCC), Coronary artery disease, Diverticulitis, DJD (degenerative joint disease), DM type 2 (diabetes mellitus, type 2) (HCC), F Fibrocystic breast disease, GERD (gastroesophageal reflux disease), History of chicken pox, Hypercholesteremia, Hypertension, Lichen planopilaris, Neuromuscular disorder (HCC), Sleep apnea, Urinary incontinence, and Vertigo (12/2019). she  has a past surgical history that includes Placement of breast implants (12/26/2004); Dilation and curettage of uterus (01/1994); Finger surgery (2001 & 2003); Breast surgery (02/1975); Rhinoplasty (03/1964); Foot surgery (1980); Breast surgery (10/1981);  Breast surgery (07/1998); Foot surgery (07/2008); Breast surgery (01/1976); Cholecystectomy (N/A, 07/08/2016); Joint replacement R THA (04/19/2013);  Mastectomy w/ sentinel node biopsy (Bilateral, 03/08/2019); Breast implant removal (Bilateral, 03/08/2019); Replacement total knee (Left); Cataract extraction w/PHACO (Right, 02/08/2020); Cataract extraction w/PHACO (Left, 02/29/2020); Mastectomy; and Partial knee arthroplasty (Right, 12/31/2022) are also affecting patient's functional outcome.   REHAB POTENTIAL: Good  CLINICAL DECISION MAKING: Evolving/moderate complexity  EVALUATION COMPLEXITY: Moderate   GOALS: Goals reviewed with patient? No  SHORT TERM GOALS: Target date: 12/24/2023  Patient will be independent with initial home exercise program for self-management of symptoms. Baseline: Initial HEP to be provided at visit 2 as appropriate (12/10/23); Goal status: In progress   LONG TERM GOALS: Target date: 03/03/2024  Patient will be independent with a long-term home exercise program for self-management of symptoms.  Baseline: Initial HEP to be provided at visit 2 as appropriate (12/10/23); Goal status: In progress  2.  Patient will demonstrate improvement in Patient Specific Functional Scale (PSFS) of equal or greater than 8/10 points to reflect clinically significant improvement in patient's most valued functional activities. Baseline: to be measured at visit 2 as appropriate (12/10/23); 4.5/10 (12/15/2023);  Goal status: In progress  3.  Patient will demonstrate the ability to go up and down 12 steps without concordant pain to improve her household and community mobility.  Baseline: pain with stairs (12/10/23); Goal status: In progress  4.  Patient will report NPRS equal or less than 3/10 during functional activities during the last 2 weeks to improve their abilitly to complete community, work and/or recreational activities with less limitation. Baseline: 7/10 (12/10/23); Goal  status: In progress    PLAN:  PT FREQUENCY: 1-2x/week  PT DURATION: 8-12 weeks  PLANNED INTERVENTIONS: 97164- PT Re-evaluation, 97750- Physical Performance Testing, 97110-Therapeutic exercises, 97530- Therapeutic activity, [id number]- Neuromuscular re-education, 97535- Self Care, 16109- Manual therapy, [id number]- Electrical stimulation (unattended), Patient/Family education, Stair training, Dry Needling, Joint mobilization, Spinal mobilization, Cryotherapy, and Moist heat  PLAN FOR NEXT SESSION: update HEP as appropriate, progressive loading to R adductor and hamstring group as tolerated, further exam as needed. Education. Manual therapy as appropriate.    Carilyn Charles. Artemio Larry, PT, DPT 12/15/23, 3:12 PM  [facility] [address] [place], [place] [address] P: [phone] I F: [phone]

## 2023-12-17 ENCOUNTER — Encounter: Admitting: Physical Therapy

## 2023-12-17 ENCOUNTER — Other Ambulatory Visit: Payer: Self-pay | Admitting: Family Medicine

## 2023-12-18 ENCOUNTER — Encounter: Payer: Self-pay | Admitting: Family Medicine

## 2023-12-18 ENCOUNTER — Ambulatory Visit: Payer: Medicare PPO | Admitting: Family Medicine

## 2023-12-18 VITALS — BP 130/60 | HR 74 | Temp 98.0°F | Ht 62.25 in | Wt 162.2 lb

## 2023-12-18 DIAGNOSIS — E538 Deficiency of other specified B group vitamins: Secondary | ICD-10-CM

## 2023-12-18 DIAGNOSIS — Z7984 Long term (current) use of oral hypoglycemic drugs: Secondary | ICD-10-CM

## 2023-12-18 DIAGNOSIS — D509 Iron deficiency anemia, unspecified: Secondary | ICD-10-CM

## 2023-12-18 DIAGNOSIS — E559 Vitamin D deficiency, unspecified: Secondary | ICD-10-CM

## 2023-12-18 DIAGNOSIS — M79671 Pain in right foot: Secondary | ICD-10-CM | POA: Diagnosis not present

## 2023-12-18 DIAGNOSIS — E113293 Type 2 diabetes mellitus with mild nonproliferative diabetic retinopathy without macular edema, bilateral: Secondary | ICD-10-CM

## 2023-12-18 DIAGNOSIS — H353131 Nonexudative age-related macular degeneration, bilateral, early dry stage: Secondary | ICD-10-CM | POA: Insufficient documentation

## 2023-12-18 DIAGNOSIS — E785 Hyperlipidemia, unspecified: Secondary | ICD-10-CM

## 2023-12-18 DIAGNOSIS — I152 Hypertension secondary to endocrine disorders: Secondary | ICD-10-CM

## 2023-12-18 DIAGNOSIS — E1169 Type 2 diabetes mellitus with other specified complication: Secondary | ICD-10-CM | POA: Diagnosis not present

## 2023-12-18 DIAGNOSIS — E1159 Type 2 diabetes mellitus with other circulatory complications: Secondary | ICD-10-CM

## 2023-12-18 DIAGNOSIS — M79672 Pain in left foot: Secondary | ICD-10-CM | POA: Insufficient documentation

## 2023-12-18 MED ORDER — ACCU-CHEK GUIDE TEST VI STRP: ORAL_STRIP | 3 refills | Status: AC

## 2023-12-18 NOTE — Progress Notes (Signed)
 Patient ID: Katie [name], female    DOB: [birth year], 78 y.o.   MRN: [id number]  This visit was conducted in person.  BP 130/60 (BP Location: Right Arm, Patient Position: Sitting, Cuff Size: Normal)   Pulse 74   Temp 98 F (36.7 C) (Temporal)   Ht 5' 2.25" (1.581 m)   Wt 162 lb 4 oz (73.6 kg)   LMP 08/26/1992 (Approximate)   SpO2 97%   BMI 29.44 kg/m    CC:  Chief Complaint  Patient presents with   Diabetes    Subjective:   HPI: Katie [name] is a 78 y.o. female presenting on 12/18/2023 for Diabetes  Diabetes:  Improved control with increase in Trulicity  to 3 mg weekly  Also on Jardiance   10 mg daily Lab Results  Component Value Date   HGBA1C 7.3 (H) 12/11/2023  Using medications without difficulties: none Hypoglycemic episodes: none Hyperglycemic episodes: yes Feet problems: no ulcers .Aaron Aas Worsening pain in feet, history of neuropathy.. now with more ache in dorsal foot.  Doing PT for leg issue.  On gabapentin .  Wearing supportive sneakers and insert in left leg for leg length discrepancy. Blood Sugars averaging: FBS 162-172 eye exam within last year: yes  Wt Readings from Last 3 Encounters:  12/18/23 162 lb 4 oz (73.6 kg)  11/17/23 158 lb 12.8 oz (72 kg)  10/17/23 157 lb 8 oz (71.4 kg)   Hypertension:   Good control  on losartan  hydrochlorothiazide  100/25 mg p.o. daily BP Readings from Last 3 Encounters:  12/18/23 130/60  12/15/23 (!) 121/48  11/17/23 114/76  Using medication without problems or lightheadedness:  none Chest pain with exertion: none Edema: none Short of breath:  stable... see pulmonary note Average home BPs: 130s/60s Other issues:  Elevated Cholesterol: Cholesterol  at goal on atorvastatin  40 mg p.o. daily and fenofibrate  145 mg p.o. daily Lab Results  Component Value Date   CHOL 151 11/10/2023   HDL 40 (L) 11/10/2023   LDLCALC 79 11/10/2023   LDLDIRECT 57.0 05/02/2020   TRIG 162 (H) 11/10/2023   CHOLHDL 3.8 11/10/2023  Using  medications without problems: Muscle aches:  Diet compliance: heart healthy Exercise: 3 times a week Other complaints:   Breast cancer active treatment on Arimidex  x 4 years, plan 7  Osteoporosis treated on Fosamax  70 mg weekly, calciuma nd vit d  Slight decreased in renal function btw 2 month labs... trying to drink more water, no NSAIDs.  Relevant past medical, surgical, family and social history reviewed and updated as indicated. Interim medical history since our last visit reviewed. Allergies and medications reviewed and updated. Outpatient Medications Prior to Visit  Medication Sig Dispense Refill   alendronate  (FOSAMAX ) 70 MG tablet TAKE 1 TABLET(70 MG) BY MOUTH 1 TIME A WEEK WITH A FULL GLASS OF WATER AND ON AN EMPTY STOMACH 12 tablet 3   anastrozole  (ARIMIDEX ) 1 MG tablet TAKE 1 TABLET(1 MG) BY MOUTH DAILY 90 tablet 3   aspirin  EC 81 MG tablet Take 81 mg by mouth daily. Swallow whole.     atorvastatin  (LIPITOR) 40 MG tablet Take 1 tablet (40 mg total) by mouth daily. 90 tablet 3   budesonide -formoterol  (SYMBICORT ) 160-4.5 MCG/ACT inhaler Inhale 2 puffs into the lungs in the morning and at bedtime. 10.2 g 12   Calcium  Carbonate-Vit D-Min (CALTRATE 600+D PLUS MINERALS) 600-800 MG-UNIT TABS Take 1 tablet by mouth in the morning and at bedtime.     clobetasol (TEMOVATE) 0.05 % external solution  Apply 1 application topically 2 (two) times daily as needed (scalp). Reported on 08/16/2015     Dulaglutide  (TRULICITY ) 3 MG/0.5ML SOAJ Inject 3 mg as directed once a week. 6 mL 11   fenofibrate  (TRICOR ) 145 MG tablet Take 1 tablet (145 mg total) by mouth daily. 90 tablet 3   fluticasone  (FLONASE ) 50 MCG/ACT nasal spray SHAKE LIQUID AND USE 2 SPRAYS IN EACH NOSTRIL EVERY DAY 16 g 11   gabapentin  (NEURONTIN ) 300 MG capsule TAKE 1 CAPSULE(300 MG) BY MOUTH AT BEDTIME 90 capsule 3   JARDIANCE  10 MG TABS tablet TAKE 1 TABLET(10 MG) BY MOUTH DAILY BEFORE BREAKFAST 30 tablet 11   loratadine  (CLARITIN )  10 MG tablet Take 10 mg by mouth daily.     losartan -hydrochlorothiazide  (HYZAAR) 100-25 MG tablet TAKE 1 TABLET BY MOUTH DAILY 90 tablet 1   metroNIDAZOLE (METROCREAM) 0.75 % cream Apply 1 Application topically 2 (two) times daily as needed.     Multiple Vitamins-Minerals (CENTRUM SILVER 50+WOMEN) TABS Take 1 tablet by mouth daily.     Polyethyl Glycol-Propyl Glycol (SYSTANE OP) Apply to eye as needed.     Probiotic Product (PROBIOTIC DAILY PO) Take by mouth. [employer]iff Digestive Advantage     psyllium (METAMUCIL) 58.6 % powder Take 1 packet by mouth daily.     valACYclovir  (VALTREX ) 500 MG tablet TAKE 1 TABLET(500 MG) BY MOUTH TWICE DAILY 6 tablet 2   ACCU-CHEK GUIDE TEST test strip 1 each by Other route daily.     No facility-administered medications prior to visit.     Per HPI unless specifically indicated in ROS section below Review of Systems  Constitutional:  Negative for fatigue and fever.  HENT:  Negative for congestion.   Eyes:  Negative for pain.  Respiratory:  Negative for cough and shortness of breath.   Cardiovascular:  Negative for chest pain, palpitations and leg swelling.  Gastrointestinal:  Negative for abdominal pain.  Genitourinary:  Negative for dysuria and vaginal bleeding.  Musculoskeletal:  Negative for back pain.  Neurological:  Negative for syncope, light-headedness and headaches.  Psychiatric/Behavioral:  Negative for dysphoric mood.    Objective:  BP 130/60 (BP Location: Right Arm, Patient Position: Sitting, Cuff Size: Normal)   Pulse 74   Temp 98 F (36.7 C) (Temporal)   Ht 5' 2.25" (1.581 m)   Wt 162 lb 4 oz (73.6 kg)   LMP 08/26/1992 (Approximate)   SpO2 97%   BMI 29.44 kg/m   Wt Readings from Last 3 Encounters:  12/18/23 162 lb 4 oz (73.6 kg)  11/17/23 158 lb 12.8 oz (72 kg)  10/17/23 157 lb 8 oz (71.4 kg)      Physical Exam Vitals and nursing note reviewed.  Constitutional:      General: She is not in acute distress.    Appearance: Normal  appearance. She is well-developed. She is not ill-appearing or toxic-appearing.  HENT:     Head: Normocephalic.     Right Ear: Hearing, tympanic membrane, ear canal and external ear normal. Tympanic membrane is not erythematous, retracted or bulging.     Left Ear: Hearing, tympanic membrane, ear canal and external ear normal. Tympanic membrane is not erythematous, retracted or bulging.     Nose: Nose normal. No mucosal edema or rhinorrhea.     Right Sinus: No maxillary sinus tenderness or frontal sinus tenderness.     Left Sinus: No maxillary sinus tenderness or frontal sinus tenderness.     Mouth/Throat:     Pharynx: Uvula  midline.  Eyes:     General: Lids are normal. Lids are everted, no foreign bodies appreciated.     Conjunctiva/sclera: Conjunctivae normal.     Pupils: Pupils are equal, round, and reactive to light.  Neck:     Thyroid : No thyroid  mass or thyromegaly.     Vascular: No carotid bruit.     Trachea: Trachea normal.  Cardiovascular:     Rate and Rhythm: Normal rate and regular rhythm.     Pulses: Normal pulses.          Dorsalis pedis pulses are 2+ on the right side and 2+ on the left side.       Posterior tibial pulses are 2+ on the right side and 2+ on the left side.     Heart sounds: Normal heart sounds, S1 normal and S2 normal. No murmur heard.    No friction rub. No gallop.  Pulmonary:     Effort: Pulmonary effort is normal. No tachypnea or respiratory distress.     Breath sounds: Normal breath sounds. No decreased breath sounds, wheezing, rhonchi or rales.  Abdominal:     General: Bowel sounds are normal. There is no distension or abdominal bruit.     Palpations: Abdomen is soft. There is no fluid wave or mass.     Tenderness: There is no abdominal tenderness. There is no guarding or rebound.     Hernia: No hernia is present.  Musculoskeletal:     Cervical back: Normal range of motion and neck supple.     Right foot: Normal range of motion. No deformity,  bunion, Charcot foot or foot drop.     Left foot: Normal range of motion. No deformity, bunion, Charcot foot or foot drop.  Feet:     Right foot:     Skin integrity: Skin integrity normal.     Toenail Condition: Right toenails are normal.     Left foot:     Skin integrity: Skin integrity normal.     Toenail Condition: Left toenails are normal.  Lymphadenopathy:     Cervical: No cervical adenopathy.  Skin:    General: Skin is warm and dry.     Findings: No rash.  Neurological:     Mental Status: She is alert.     Cranial Nerves: No cranial nerve deficit.     Sensory: No sensory deficit.  Psychiatric:        Mood and Affect: Mood is not anxious or depressed.        Speech: Speech normal.        Behavior: Behavior normal. Behavior is cooperative.        Thought Content: Thought content normal.        Judgment: Judgment normal.         Results for orders placed or performed in visit on 12/15/23  [facility] DIABETES EYE EXAM   Collection Time: 11/19/23  4:24 PM  Result Value Ref Range   [facility] Diabetic Eye Exam Retinopathy (A) No Retinopathy     COVID 19 screen:  No recent travel or known exposure to COVID19 The patient denies respiratory symptoms of COVID 19 at this time. The importance of social distancing was discussed today.   Assessment and Plan    Problem List Items Addressed This Visit     Anemia   Relevant Orders   CBC with Differential/Platelet   IBC + Ferritin   Bilateral foot pain   Possibly due to neuropathy versus strain in ligaments  from leg length issue.  Return to podiatry for possible orthotics. Start ROM exercises and ice, elevation prn.  Continue gabapentin  t night but can consider daytime dose.      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  Trulicity   3 mg weekly.  Jardiance  10 mg daily. Work on [employer] and restart exercise.      Relevant Medications   aspirin  EC 81 MG tablet   ACCU-CHEK GUIDE  TEST test strip   Other Relevant Orders   Hemoglobin A1c   Microalbumin / creatinine urine ratio   Early dry stage nonexudative age-related macular degeneration of both eyes   Hyperlipidemia associated with type 2 diabetes mellitus (HCC)   Stable, chronic.  Continue current medication.   Atorvastatin  40 mg daily. .. Recent increase in last month per cardiology Fenofibrate  145 m,g daily      Relevant Medications   aspirin  EC 81 MG tablet   Other Relevant Orders   Lipid panel   Comprehensive metabolic panel with GFR   Hypertension associated with diabetes (HCC) (Chronic)   Stable, chronic.  Continue current medication.   losartan  hydrochlorothiazide  100/25 mg p.o. daily      Relevant Medications   aspirin  EC 81 MG tablet   Vitamin B 12 deficiency   Relevant Orders   Vitamin B12   Vitamin D  deficiency   Relevant Orders   VITAMIN D  25 Hydroxy (Vit-D Deficiency, Fractures)     Orders Placed This Encounter  Procedures   Hemoglobin A1c    Standing Status:   Future    Expected Date:   03/17/2024    Expiration Date:   03/17/2024   Lipid panel    Standing Status:   Future    Expected Date:   03/17/2024    Expiration Date:   03/17/2024   Comprehensive metabolic panel with GFR    Standing Status:   Future    Expected Date:   03/17/2024    Expiration Date:   03/17/2024   Microalbumin / creatinine urine ratio    Standing Status:   Future    Expiration Date:   12/17/2024   CBC with Differential/Platelet    Standing Status:   Future    Expiration Date:   10/16/2024   IBC + Ferritin    Standing Status:   Future    Expiration Date:   12/17/2024   Vitamin B12    Standing Status:   Future    Expiration Date:   12/17/2024   VITAMIN D  25 Hydroxy (Vit-D Deficiency, Fractures)    Standing Status:   Future    Expiration Date:   12/17/2024     Herby Lolling, MD

## 2023-12-18 NOTE — Assessment & Plan Note (Signed)
 Possibly due to neuropathy versus strain in ligaments from leg length issue.  Return to podiatry for possible orthotics. Start ROM exercises and ice, elevation prn.  Continue gabapentin  t night but can consider daytime dose.

## 2023-12-18 NOTE — Assessment & Plan Note (Addendum)
 Stable, chronic.  Continue current medication.   Atorvastatin  40 mg daily. .. Recent increase in last month per cardiology Fenofibrate  145 m,g daily

## 2023-12-18 NOTE — Assessment & Plan Note (Signed)
 Stable, chronic.  Continue current medication.   losartan  hydrochlorothiazide  100/25 mg p.o. daily

## 2023-12-18 NOTE — Assessment & Plan Note (Signed)
 Chronic, improving control.  Trulicity   3 mg weekly.  Jardiance  10 mg daily. Work on [employer] and restart exercise.

## 2023-12-22 ENCOUNTER — Ambulatory Visit: Admitting: Physical Therapy

## 2023-12-22 ENCOUNTER — Encounter: Payer: Self-pay | Admitting: Physical Therapy

## 2023-12-22 DIAGNOSIS — M79651 Pain in right thigh: Secondary | ICD-10-CM

## 2023-12-22 DIAGNOSIS — G8929 Other chronic pain: Secondary | ICD-10-CM | POA: Diagnosis not present

## 2023-12-22 DIAGNOSIS — M25561 Pain in right knee: Secondary | ICD-10-CM | POA: Diagnosis not present

## 2023-12-22 NOTE — Therapy (Signed)
 OUTPATIENT PHYSICAL THERAPY TREATMENT   Patient Name: Katie [name] MRN: [id number] DOB:1945-12-18, 78 y.o., female Today's Date: 12/22/2023  END OF SESSION:  PT End of Session - 12/22/23 1729     Visit Number 3    Number of Visits 20    Date for PT Re-Evaluation 03/03/24    Authorization Type [employer] MEDIC[employer] reporting period from 12/10/2023    Authorization Time Period auth 20 visits  4/16-6/20  Authorization #[id number]    Authorization - Visit Number 3    Authorization - Number of Visits 20    Progress Note Due on Visit 10    PT Start Time 1350    PT Stop Time 1439    PT Time Calculation (min) 49 min    Activity Tolerance Patient tolerated treatment well    Behavior During Therapy [facility] for tasks assessed/performed               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, small, not treating-watching , it is now stented   Blood transfusion without reported diagnosis    Cancer (HCC)    breast 08/2018   Coronary artery disease    Diverticulitis    DJD (degenerative joint disease)    DM type 2 (diabetes mellitus, type 2) (HCC)    Family history of breast cancer    Fibrocystic breast disease    GERD (gastroesophageal reflux disease)    History of chicken pox    Hypercholesteremia    Hypertension    Lichen planopilaris    Neuromuscular disorder (HCC)    Sleep apnea    cpap   Urinary incontinence    Vertigo 12/2019   1 episode   Past Surgical History:  Procedure Laterality Date   BREAST IMPLANT REMOVAL Bilateral 03/08/2019   Procedure: Removal of Bilateral Breast Implants;  Surgeon: Enid Harry, MD;  Location: [facility] OR;  Service: General;  Laterality: Bilateral;   BREAST SURGERY  02/1975   Left Breast Biopsy-Fibrocystic Disease   BREAST SURGERY  10/1981   Bialteral Capsulectomy Breast Surgery   BREAST SURGERY  07/1998   Replace Bilateral Silicone Implants   BREAST SURGERY  01/1976   [place] breast surgery  to remove fibrocystic tissue and replace with silicone implants   CATARACT EXTRACTION W/PHACO Right 02/08/2020   Procedure: CATARACT EXTRACTION PHACO AND INTRAOCULAR LENS PLACEMENT (IOC) RIGHT DIABETIC;  Surgeon: Clair Crews, MD;  Location: [facility];  Service: Ophthalmology;  Laterality: Right;  3.55 0:33.7   CATARACT EXTRACTION W/PHACO Left 02/29/2020   Procedure: CATARACT EXTRACTION PHACO AND INTRAOCULAR LENS PLACEMENT (IOC) LEFT DIABETIC 2.93  00:27.1;  Surgeon: Clair Crews, MD;  Location: [facility];  Service: Ophthalmology;  Laterality: Left;  Diabetic - oral meds   CHOLECYSTECTOMY N/A 07/08/2016   Procedure: LAPAROSCOPIC CHOLECYSTECTOMY;  Surgeon: Gwyndolyn Lerner, MD;  Location: [facility] ORS;  Service: General;  Laterality: N/A;   COLONOSCOPY WITH PROPOFOL  N/A 11/07/2017   Procedure: COLONOSCOPY WITH PROPOFOL ;  Surgeon: Luke Salaam, MD;  Location: [facility];  Service: Gastroenterology;  Laterality: N/A;   DILATION AND CURETTAGE OF UTERUS  01/1994   ESOPHAGOGASTRODUODENOSCOPY (EGD) WITH PROPOFOL  N/A 12/05/2016   Procedure: ESOPHAGOGASTRODUODENOSCOPY (EGD) WITH PROPOFOL ;  Surgeon: Luke Salaam, MD;  Location: [facility] ENDOSCOPY;  Service: Endoscopy;  Laterality: N/A;   FINGER SURGERY  2001 & 2003   Trigger Finger   FOOT SURGERY  1980   To Relieve Pinched Nerve  FOOT SURGERY  07/2008   Left Foot-Plantar Fasciitis   JOINT REPLACEMENT  04/19/2013   Hip Replacement-Right   MASTECTOMY     double   MASTECTOMY W/ SENTINEL NODE BIOPSY Bilateral 03/08/2019   Procedure: BILATERAL MASTECTOMIES WITH LEFT AXILLARY SENTINEL LYMPH NODE BIOPSY AND BLUE DYE INJECTION;  Surgeon: Enid Harry, MD;  Location: [facility] OR;  Service: General;  Laterality: Bilateral;   PARTIAL KNEE ARTHROPLASTY Right 12/31/2022   Procedure: RIGHT PARTIAL KNEE ARTHROPLASTY;  Surgeon: Elner Hahn, MD;  Location: [facility] ORS;  Service: Orthopedics;  Laterality: Right;   PLACEMENT OF BREAST IMPLANTS   12/26/2004   Replace Failed Implants   REPLACEMENT TOTAL KNEE Left    2019-   RHINOPLASTY  03/1964   To Correct Deviated Septum   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/2023   Acute diverticulitis 03/21/2023   Abdominal pain, left lower quadrant 03/16/2023   Burning with urination 03/07/2023   Overweight (BMI 25.0-29.9) 03/05/2022   Elevated ferritin 03/23/2021   Decreased GFR 02/23/2021   Vitamin B 12 deficiency 02/23/2021   Leukopenia 02/23/2021   Bronchiectasis without complication (HCC) 01/26/2020   Aortic atherosclerosis (HCC) 12/23/2019   Atherosclerosis of coronary artery of native heart without angina pectoris 12/23/2019   Peripheral edema 12/22/2019   Chemotherapy-induced peripheral neuropathy (HCC) 10/29/2019   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   Anemia 10/15/2018   Diabetic retinopathy (HCC) 09/29/2018   Family history of leukemia 09/16/2018   Family history of breast cancer    Malignant neoplasm of upper-outer quadrant of left breast in female, 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/11/2018   Family history of breast cancer in sister 08/28/2018   Diverticular disease 10/17/2017   Primary osteoarthritis of right knee 03/07/2017   HSV-1 (herpes simplex virus 1) infection, 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   Vitamin D  deficiency 10/11/2016   Family history of thyroid  disorder 04/04/2015   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   Counseling regarding end of life decision making 10/04/2014   OA (osteoarthritis) of neck 08/02/2014   History of duodenal ulcer 08/02/2014   Seasonal allergies 08/02/2014   Hypertension associated with diabetes (HCC) 08/02/2014   Hyperlipidemia associated with type 2  diabetes mellitus (HCC) 08/02/2014   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   History of joint replacement 03/01/2014   Diabetes mellitus type 2 with retinopathy (HCC) 11/07/2009    PCP: Judithann Novas, MD  REFERRING PROVIDER: Rojelio Clement, PA   REFERRING DIAG: status post right partial knee replacement, hamstring tendinosis of right thigh, strain of right hip adductor muscle  THERAPY DIAG:  Pain in right thigh  Chronic pain of right knee  Rationale for Evaluation and Treatment: Rehabilitation  ONSET DATE: September/October 2024  SUBJECTIVE:   PERTINENT HISTORY: Patient is a 78 y.o. female who presents to outpatient physical therapy with a referral for medical diagnosis status post right partial knee replacement, hamstring tendinosis of right thigh, strain of right hip adductor muscle. This patient's chief complaints consist of pain at the distal medial right thigh, leading to the following functional deficits: difficulty with sleeping, exercising, walking, going up and down stairs,  doing ADLs and IADLs, lifting an object of the floor, performing heavy activities around the home, rolling over in bed. Relevant past  medical history and comorbidities include the following: she has Diabetes mellitus type 2 with retinopathy (HCC); OA (osteoarthritis) of neck; History of duodenal ulcer; Seasonal allergies; Hypertension associated with diabetes (HCC); Hyperlipidemia associated with type 2 diabetes mellitus (HCC); Lichen plano-pilaris; Osteoporosis of forearm; History of joint replacement; Vitamin D  deficiency; HSV-1 (herpes simplex virus 1) infection, vaginal; Primary osteoarthritis of right knee; Diverticular disease; Malignant neoplasm of upper-outer quadrant of left breast in female, estrogen receptor positive (HCC); Diabetic retinopathy (HCC); Anemia; Carotid artery aneurysm (HCC); Chemotherapy-induced peripheral neuropathy (HCC); Peripheral edema; Aortic atherosclerosis (HCC); Atherosclerosis  of coronary artery of native heart without angina pectoris; Bronchiectasis without complication (HCC); Decreased GFR; Vitamin B 12 deficiency; Leukopenia; Elevated ferritin; Overweight (BMI 25.0-29.9); Burning with urination; Abdominal pain, left lower quadrant; Acute diverticulitis; Chronic cough; Reactive airways dysfunction syndrome (HCC); Acute cough; Hypercalcemia; and Pre-syncope on their problem list. she  has a past medical history of Allergy, Anemia, Aneurysm (HCC), Blood transfusion without reported diagnosis, Cancer (HCC), Coronary artery disease, Diverticulitis, DJD (degenerative joint disease), DM type 2 (diabetes mellitus, type 2) (HCC), F Fibrocystic breast disease, GERD (gastroesophageal reflux disease), History of chicken pox, Hypercholesteremia, Hypertension, Lichen planopilaris, Neuromuscular disorder (HCC), Sleep apnea, Urinary incontinence, and Vertigo (12/2019). she  has a past surgical history that includes Placement of breast implants (12/26/2004); Dilation and curettage of uterus (01/1994); Finger surgery (2001 & 2003); Breast surgery (02/1975); Rhinoplasty (03/1964); Foot surgery (1980); Breast surgery (10/1981); Breast surgery (07/1998); Foot surgery (07/2008); Breast surgery (01/1976); Cholecystectomy (N/A, 07/08/2016); Joint replacement R THA (04/19/2013);  Mastectomy w/ sentinel node biopsy (Bilateral, 03/08/2019); Breast implant removal (Bilateral, 03/08/2019); Replacement total knee (Left); Cataract extraction w/PHACO (Right, 02/08/2020); Cataract extraction w/PHACO (Left, 02/29/2020); Mastectomy; and Partial knee arthroplasty (Right, 12/31/2022). Patient denies hx of stroke, seizures, unexplained weight loss, unexplained changes in bowel or bladder problems, unexplained stumbling or dropping things, and spinal surgery. Has osteoporosis in forearm (spine steady), almost 5 years out from breast cancer, has some lung damage from chemo, and has CAD but not cardiac events .  Exercise  history: Usually goes to the gym pretty regularly with her husband. Mostly does the Nustep since her knee surgery. Also has done the treadmill. Avoids the machines if her concordant pain is actively hurting.   SUBJECTIVE STATEMENT: Patient states she felt good after last PT session and the exercise did help. She did have some pain in her bilateral lateral hips right after last PT session. She has been busy going up and down stairs cleaning up after some people who put tile in at their home. She did her HEP the first 2 days after last PT. After that she did not do them because she was so busy.     PAIN: NPRS: Current: 8-9/10 this morning and last night. 6/10 currently Best: 0/10, Worst: 7-8/10. Pain location: medial side of distal posterior thigh Pain description: sharp then gradually fades, lasts a few minutes.  Aggravating factors: turning over in the bed or lifting her R leg up when she is laying on her back.  Relieving factors: puts voltaren gel on it (unsure if it helps).      FUNCTIONAL LIMITATIONS: difficulty with sleeping, exercising, walking, going up and down stairs,  doing ADLs and IADLs, lifting an object of the floor, performing heavy activities around the home, rolling over in bed.  LEISURE: goes to the gym, traveling, exercising, going to the movies, pretty active for age.    PRECAUTIONS: None, used to be not allowed to lift anything over  30# due to aneurism that was repaired  WEIGHT BEARING RESTRICTIONS: No  FALLS:  Has patient fallen in last 6 months? 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   NEXT MD VISIT:   OBJECTIVE                                                                                                                               TREATMENT Therapeutic exercise: therapeutic exercises that incorporate ONE parameter at one or  more areas of the body to centralize symptoms, develop strength and endurance, range of motion, and flexibility required for successful completion of functional activities.  NuStep using bilateral upper and lower extremities. For improved extremity mobility, muscular endurance, and activity tolerance; and to induce the analgesic effect of aerobic exercise, stimulate improved joint nutrition, and prepare body structures and systems for following interventions. Also to reinforce understanding of appropriate exercise intensity to help meet physical activity guidelines for health.  Seat/handle setting: 6/8 6:46  minutes Level: 3 Target SPM: > 100 Average SPM: 93 RPE: 7/10 (Slight difficulty holding conversation by end, with goal of 100 spm and level 3)  Hooklying R hip addutor stretch with strap 2x30 seconds PROM/AAROM with PT assist 1x45 seconds with strap Strong pull noted at concordant location on knee and adductors Added to HEP with handout  Therapeutic activities: dynamic therapeutic activities incorporating MULTIPLE parameters or areas of the body designed to achieve improved functional performance.  Ascent/descent of 4 Six-inch steps to assess effect on function of interventions (feeling better)  Pt required multimodal cuing for proper technique and to facilitate improved neuromuscular control, strength, range of motion, and functional ability resulting in improved performance and form.  Manual therapy: to reduce pain and tissue tension, improve range of motion, neuromodulation, in order to promote improved ability to complete functional activities. HOOKLYING/SUPINE STM to R quads, hip adductors  PRONE STM to R hamstrings, hip adductors  SIDELYING (L side up) STM to right hip adductors   Trigger Point Dry Needling  Initial Treatment: Pt instructed on Dry Needling rational, procedures, and possible side effects. Pt instructed to expect mild to moderate muscle soreness later in  the day and/or into the next day.  Pt instructed in methods to reduce muscle soreness. Pt instructed to continue prescribed HEP. Patient was educated on signs and symptoms of infection and other risk factors and advised to seek medical attention should they occur.  Patient verbalized understanding of these instructions and education.  Patient Verbal Consent Given: Yes Education Handout Provided: Yes Muscles Treated: 2 dry needle(s) .30mm x 60mm inserted with 2 sticks each muscle into right hip adductor muscles and medial hamstrings with patient in sidelying/prone position to decrease pain and spasms along patient's left knee region. Electrical Stimulation Performed: No Treatment Response/Outcome: Patient tolerated well  with mild twitch response at 1-2  insertions into the hamstrings/adductors. No abnormal response noted.    PATIENT EDUCATION:  Education details: Exercise purpose/form. Self management techniques. Dry needling Person educated: Patient Education method: Explanation, Demonstration, and Handouts Education comprehension: verbalized understanding and needs further education  HOME EXERCISE PROGRAM: Access Code: [email or web address] URL: [email or web address] Date: 12/22/2023 Prepared by: Alleen Isle  Exercises - Supine Extended Bridge on Heels  - 3 x daily - 1 sets - 3 reps - 45 seconds hold - Supine Hip Adduction Isometric with Ball  - 3 x daily - 1 sets - 3 reps - 45 seconds hold - Hip Adductors and Hamstring Stretch with Strap  - 2 x daily - 3 reps - 45 seconds hold  ASSESSMENT:  CLINICAL IMPRESSION: Patient arrives with continued difficulty with concordant pain. Utilized dry needling over R hip adductors and medial hamstrings to address pain and tightness there. She had most concordant pain along adductors. Patient tolerated well  with mild twitch response at 1-2 insertions into the hamstrings/adductors. She was able to climb/descend stairs with less pain by end of  session. Patient would benefit from continued management of limiting condition by skilled physical therapist to address remaining impairments and functional limitations to work towards stated goals and return to PLOF or maximal functional independence.    From initial PT evaluation on 12/10/2023:  Patient is a 78 y.o. female referred to outpatient physical therapy with a medical diagnosis of status post right partial knee replacement, hamstring tendinosis of right thigh, strain of right hip adductor muscle who presents with signs and symptoms consistent with posterior medial R thigh pain. It does not appear to be referring from the low back based on negative slump test and reflexes. Most TTP at adductors but also tender at distal medial hamstrings. Patient presents with significant pain, activity tolerance, knowledge, decreased participation in exercise (not currently meeting physical activity recommendations for health) impairments that are limiting ability to complete sleeping, exercising, walking, going up and down stairs,  doing ADLs and IADLs, lifting an object of the floor, performing heavy activities around the home, rolling over in bed without difficulty. Patient will benefit from skilled physical therapy intervention to address current body structure impairments and activity limitations to improve function and work towards goals set in current POC in order to return to prior level of function or maximal functional improvement.    OBJECTIVE IMPAIRMENTS: decreased activity tolerance, decreased knowledge of condition, decreased mobility, difficulty walking, and pain.   ACTIVITY LIMITATIONS: lifting, sleeping, and stairs  PARTICIPATION LIMITATIONS: community activity and   difficulty with sleeping, exercising, walking, going up and down stairs,  doing ADLs and IADLs, lifting an object of the floor, performing heavy activities around the home, rolling over in bed  PERSONAL FACTORS: Age, Fitness,  Past/current experiences, Time since onset of injury/illness/exacerbation, and 3+ comorbidities:   Diabetes mellitus type 2 with retinopathy (HCC); OA (osteoarthritis) of neck; History of duodenal ulcer; Seasonal allergies; Hypertension associated with diabetes (HCC); Hyperlipidemia associated with type 2 diabetes mellitus (HCC); Lichen plano-pilaris; Osteoporosis of forearm; History of joint replacement; Vitamin D  deficiency; HSV-1 (herpes simplex virus 1) infection, vaginal; Primary osteoarthritis of right knee; Diverticular disease; Malignant neoplasm of upper-outer quadrant of left breast in female, estrogen receptor positive (HCC); Diabetic retinopathy (HCC); Anemia; Carotid artery aneurysm (HCC); Chemotherapy-induced peripheral neuropathy (HCC); Peripheral edema; Aortic atherosclerosis (HCC); Atherosclerosis of coronary artery of native heart without angina pectoris; Bronchiectasis without complication (HCC); Decreased GFR; Vitamin B 12 deficiency; Leukopenia; Elevated ferritin; Overweight (BMI 25.0-29.9); Burning with urination;  Abdominal pain, left lower quadrant; Acute diverticulitis; Chronic cough; Reactive airways dysfunction syndrome (HCC); Acute cough; Hypercalcemia; and Pre-syncope on their problem list. she  has a past medical history of Allergy, Anemia, Aneurysm (HCC), Blood transfusion without reported diagnosis, Cancer (HCC), Coronary artery disease, Diverticulitis, DJD (degenerative joint disease), DM type 2 (diabetes mellitus, type 2) (HCC), F Fibrocystic breast disease, GERD (gastroesophageal reflux disease), History of chicken pox, Hypercholesteremia, Hypertension, Lichen planopilaris, Neuromuscular disorder (HCC), Sleep apnea, Urinary incontinence, and Vertigo (12/2019). she  has a past surgical history that includes Placement of breast implants (12/26/2004); Dilation and curettage of uterus (01/1994); Finger surgery (2001 & 2003); Breast surgery (02/1975); Rhinoplasty (03/1964); Foot surgery  (1980); Breast surgery (10/1981); Breast surgery (07/1998); Foot surgery (07/2008); Breast surgery (01/1976); Cholecystectomy (N/A, 07/08/2016); Joint replacement R THA (04/19/2013);  Mastectomy w/ sentinel node biopsy (Bilateral, 03/08/2019); Breast implant removal (Bilateral, 03/08/2019); Replacement total knee (Left); Cataract extraction w/PHACO (Right, 02/08/2020); Cataract extraction w/PHACO (Left, 02/29/2020); Mastectomy; and Partial knee arthroplasty (Right, 12/31/2022) are also affecting patient's functional outcome.   REHAB POTENTIAL: Good  CLINICAL DECISION MAKING: Evolving/moderate complexity  EVALUATION COMPLEXITY: Moderate   GOALS: Goals reviewed with patient? No  SHORT TERM GOALS: Target date: 12/24/2023  Patient will be independent with initial home exercise program for self-management of symptoms. Baseline: Initial HEP to be provided at visit 2 as appropriate (12/10/23); Goal status: In progress   LONG TERM GOALS: Target date: 03/03/2024  Patient will be independent with a long-term home exercise program for self-management of symptoms.  Baseline: Initial HEP to be provided at visit 2 as appropriate (12/10/23); Goal status: In progress  2.  Patient will demonstrate improvement in Patient Specific Functional Scale (PSFS) of equal or greater than 8/10 points to reflect clinically significant improvement in patient's most valued functional activities. Baseline: to be measured at visit 2 as appropriate (12/10/23); 4.5/10 (12/15/2023);  Goal status: In progress  3.  Patient will demonstrate the ability to go up and down 12 steps without concordant pain to improve her household and community mobility.  Baseline: pain with stairs (12/10/23); Goal status: In progress  4.  Patient will report NPRS equal or less than 3/10 during functional activities during the last 2 weeks to improve their abilitly to complete community, work and/or recreational activities with less  limitation. Baseline: 7/10 (12/10/23); Goal status: In progress    PLAN:  PT FREQUENCY: 1-2x/week  PT DURATION: 8-12 weeks  PLANNED INTERVENTIONS: 97164- PT Re-evaluation, 97750- Physical Performance Testing, 97110-Therapeutic exercises, 97530- Therapeutic activity, [id number]- Neuromuscular re-education, 97535- Self Care, 84132- Manual therapy, [id number]- Electrical stimulation (unattended), Patient/Family education, Stair training, Dry Needling, Joint mobilization, Spinal mobilization, Cryotherapy, and Moist heat  PLAN FOR NEXT SESSION: update HEP as appropriate, progressive loading to R adductor and hamstring group as tolerated, further exam as needed. Education. Manual therapy as appropriate.    Carilyn Charles. Artemio Larry, PT, DPT 12/22/23, 9:14 PM  [facility] [address] [place], [place] [address] P: [phone] I F: [phone]

## 2023-12-22 NOTE — Patient Instructions (Signed)

## 2023-12-24 ENCOUNTER — Encounter: Payer: Self-pay | Admitting: Physical Therapy

## 2023-12-24 ENCOUNTER — Ambulatory Visit: Admitting: Physical Therapy

## 2023-12-24 DIAGNOSIS — M79651 Pain in right thigh: Secondary | ICD-10-CM | POA: Diagnosis not present

## 2023-12-24 DIAGNOSIS — M25561 Pain in right knee: Secondary | ICD-10-CM | POA: Diagnosis not present

## 2023-12-24 DIAGNOSIS — G8929 Other chronic pain: Secondary | ICD-10-CM | POA: Diagnosis not present

## 2023-12-24 NOTE — Therapy (Signed)
 OUTPATIENT PHYSICAL THERAPY TREATMENT   Patient Name: Katie [name] MRN: [id number] DOB:[birth year], 77 y.o., female Today's Date: 12/24/2023  END OF SESSION:  PT End of Session - 12/24/23 1624     Visit Number 4    Number of Visits 20    Date for PT Re-Evaluation 03/03/24    Authorization Type [employer] MEDIC[employer] reporting period from 12/10/2023    Authorization Time Period auth 20 visits  4/16-6/20  Authorization #[id number]    Authorization - Visit Number 4    Authorization - Number of Visits 20    Progress Note Due on Visit 10    PT Start Time 1437    PT Stop Time 1520    PT Time Calculation (min) 43 min    Activity Tolerance Patient tolerated treatment well    Behavior During Therapy [facility] for tasks assessed/performed                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, small, not treating-watching , it is now stented   Blood transfusion without reported diagnosis    Cancer (HCC)    breast 08/2018   Coronary artery disease    Diverticulitis    DJD (degenerative joint disease)    DM type 2 (diabetes mellitus, type 2) (HCC)    Family history of breast cancer    Fibrocystic breast disease    GERD (gastroesophageal reflux disease)    History of chicken pox    Hypercholesteremia    Hypertension    Lichen planopilaris    Neuromuscular disorder (HCC)    Sleep apnea    cpap   Urinary incontinence    Vertigo 12/2019   1 episode   Past Surgical History:  Procedure Laterality Date   BREAST IMPLANT REMOVAL Bilateral 03/08/2019   Procedure: Removal of Bilateral Breast Implants;  Surgeon: Enid Harry, MD;  Location: [facility] OR;  Service: General;  Laterality: Bilateral;   BREAST SURGERY  02/1975   Left Breast Biopsy-Fibrocystic Disease   BREAST SURGERY  10/1981   Bialteral Capsulectomy Breast Surgery   BREAST SURGERY  07/1998   Replace Bilateral Silicone Implants   BREAST SURGERY  01/1976   [place] breast  surgery to remove fibrocystic tissue and replace with silicone implants   CATARACT EXTRACTION W/PHACO Right 02/08/2020   Procedure: CATARACT EXTRACTION PHACO AND INTRAOCULAR LENS PLACEMENT (IOC) RIGHT DIABETIC;  Surgeon: Clair Crews, MD;  Location: [facility];  Service: Ophthalmology;  Laterality: Right;  3.55 0:33.7   CATARACT EXTRACTION W/PHACO Left 02/29/2020   Procedure: CATARACT EXTRACTION PHACO AND INTRAOCULAR LENS PLACEMENT (IOC) LEFT DIABETIC 2.93  00:27.1;  Surgeon: Clair Crews, MD;  Location: [facility];  Service: Ophthalmology;  Laterality: Left;  Diabetic - oral meds   CHOLECYSTECTOMY N/A 07/08/2016   Procedure: LAPAROSCOPIC CHOLECYSTECTOMY;  Surgeon: Gwyndolyn Lerner, MD;  Location: [facility] ORS;  Service: General;  Laterality: N/A;   COLONOSCOPY WITH PROPOFOL  N/A 11/07/2017   Procedure: COLONOSCOPY WITH PROPOFOL ;  Surgeon: Luke Salaam, MD;  Location: [facility] ENDOSCOPY;  Service: Gastroenterology;  Laterality: N/A;   DILATION AND CURETTAGE OF UTERUS  01/1994   ESOPHAGOGASTRODUODENOSCOPY (EGD) WITH PROPOFOL  N/A 12/05/2016   Procedure: ESOPHAGOGASTRODUODENOSCOPY (EGD) WITH PROPOFOL ;  Surgeon: Luke Salaam, MD;  Location: [facility] ENDOSCOPY;  Service: Endoscopy;  Laterality: N/A;   FINGER SURGERY  2001 & 2003   Trigger Finger   FOOT SURGERY  1980   To Relieve Pinched  Nerve   FOOT SURGERY  07/2008   Left Foot-Plantar Fasciitis   JOINT REPLACEMENT  04/19/2013   Hip Replacement-Right   MASTECTOMY     double   MASTECTOMY W/ SENTINEL NODE BIOPSY Bilateral 03/08/2019   Procedure: BILATERAL MASTECTOMIES WITH LEFT AXILLARY SENTINEL LYMPH NODE BIOPSY AND BLUE DYE INJECTION;  Surgeon: Enid Harry, MD;  Location: [facility] OR;  Service: General;  Laterality: Bilateral;   PARTIAL KNEE ARTHROPLASTY Right 12/31/2022   Procedure: RIGHT PARTIAL KNEE ARTHROPLASTY;  Surgeon: Elner Hahn, MD;  Location: [facility] ORS;  Service: Orthopedics;  Laterality: Right;   PLACEMENT OF BREAST  IMPLANTS  12/26/2004   Replace Failed Implants   REPLACEMENT TOTAL KNEE Left    2019-   RHINOPLASTY  03/1964   To Correct Deviated Septum   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/2023   Acute diverticulitis 03/21/2023   Abdominal pain, left lower quadrant 03/16/2023   Burning with urination 03/07/2023   Overweight (BMI 25.0-29.9) 03/05/2022   Elevated ferritin 03/23/2021   Decreased GFR 02/23/2021   Vitamin B 12 deficiency 02/23/2021   Leukopenia 02/23/2021   Bronchiectasis without complication (HCC) 01/26/2020   Aortic atherosclerosis (HCC) 12/23/2019   Atherosclerosis of coronary artery of native heart without angina pectoris 12/23/2019   Peripheral edema 12/22/2019   Chemotherapy-induced peripheral neuropathy (HCC) 10/29/2019   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   Anemia 10/15/2018   Diabetic retinopathy (HCC) 09/29/2018   Family history of leukemia 09/16/2018   Family history of breast cancer    Malignant neoplasm of upper-outer quadrant of left breast in female, 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/11/2018   Family history of breast cancer in sister 08/28/2018   Diverticular disease 10/17/2017   Primary osteoarthritis of right knee 03/07/2017   HSV-1 (herpes simplex virus 1) infection, 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   Vitamin D  deficiency 10/11/2016   Family history of thyroid  disorder 04/04/2015   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   Counseling regarding end of life decision making 10/04/2014   OA (osteoarthritis) of neck 08/02/2014   History of duodenal ulcer 08/02/2014   Seasonal allergies 08/02/2014   Hypertension associated with diabetes (HCC) 08/02/2014   Hyperlipidemia associated  with type 2 diabetes mellitus (HCC) 08/02/2014   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   History of joint replacement 03/01/2014   Diabetes mellitus type 2 with retinopathy (HCC) 11/07/2009    PCP: Judithann Novas, MD  REFERRING PROVIDER: Rojelio Clement, PA   REFERRING DIAG: status post right partial knee replacement, hamstring tendinosis of right thigh, strain of right hip adductor muscle  THERAPY DIAG:  Pain in right thigh  Chronic pain of right knee  Rationale for Evaluation and Treatment: Rehabilitation  ONSET DATE: September/October 2024  SUBJECTIVE:   PERTINENT HISTORY: Patient is a 78 y.o. female who presents to outpatient physical therapy with a referral for medical diagnosis status post right partial knee replacement, hamstring tendinosis of right thigh, strain of right hip adductor muscle. This patient's chief complaints consist of pain at the distal medial right thigh, leading to the following functional deficits: difficulty with sleeping, exercising, walking, going up and down stairs,  doing ADLs and IADLs, lifting an object of the floor, performing heavy activities around the home, rolling over in  bed. Relevant past medical history and comorbidities include the following: she has Diabetes mellitus type 2 with retinopathy (HCC); OA (osteoarthritis) of neck; History of duodenal ulcer; Seasonal allergies; Hypertension associated with diabetes (HCC); Hyperlipidemia associated with type 2 diabetes mellitus (HCC); Lichen plano-pilaris; Osteoporosis of forearm; History of joint replacement; Vitamin D  deficiency; HSV-1 (herpes simplex virus 1) infection, vaginal; Primary osteoarthritis of right knee; Diverticular disease; Malignant neoplasm of upper-outer quadrant of left breast in female, estrogen receptor positive (HCC); Diabetic retinopathy (HCC); Anemia; Carotid artery aneurysm (HCC); Chemotherapy-induced peripheral neuropathy (HCC); Peripheral edema; Aortic atherosclerosis (HCC);  Atherosclerosis of coronary artery of native heart without angina pectoris; Bronchiectasis without complication (HCC); Decreased GFR; Vitamin B 12 deficiency; Leukopenia; Elevated ferritin; Overweight (BMI 25.0-29.9); Burning with urination; Abdominal pain, left lower quadrant; Acute diverticulitis; Chronic cough; Reactive airways dysfunction syndrome (HCC); Acute cough; Hypercalcemia; and Pre-syncope on their problem list. she  has a past medical history of Allergy, Anemia, Aneurysm (HCC), Blood transfusion without reported diagnosis, Cancer (HCC), Coronary artery disease, Diverticulitis, DJD (degenerative joint disease), DM type 2 (diabetes mellitus, type 2) (HCC), F Fibrocystic breast disease, GERD (gastroesophageal reflux disease), History of chicken pox, Hypercholesteremia, Hypertension, Lichen planopilaris, Neuromuscular disorder (HCC), Sleep apnea, Urinary incontinence, and Vertigo (12/2019). she  has a past surgical history that includes Placement of breast implants (12/26/2004); Dilation and curettage of uterus (01/1994); Finger surgery (2001 & 2003); Breast surgery (02/1975); Rhinoplasty (03/1964); Foot surgery (1980); Breast surgery (10/1981); Breast surgery (07/1998); Foot surgery (07/2008); Breast surgery (01/1976); Cholecystectomy (N/A, 07/08/2016); Joint replacement R THA (04/19/2013);  Mastectomy w/ sentinel node biopsy (Bilateral, 03/08/2019); Breast implant removal (Bilateral, 03/08/2019); Replacement total knee (Left); Cataract extraction w/PHACO (Right, 02/08/2020); Cataract extraction w/PHACO (Left, 02/29/2020); Mastectomy; and Partial knee arthroplasty (Right, 12/31/2022). Patient denies hx of stroke, seizures, unexplained weight loss, unexplained changes in bowel or bladder problems, unexplained stumbling or dropping things, and spinal surgery. Has osteoporosis in forearm (spine steady), almost 5 years out from breast cancer, has some lung damage from chemo, and has CAD but not cardiac events  .  Exercise history: Usually goes to the gym pretty regularly with her husband. Mostly does the Nustep since her knee surgery. Also has done the treadmill. Avoids the machines if her concordant pain is actively hurting.   SUBJECTIVE STATEMENT: Patient states her leg is feeling a little better during the daytime. She thinks the exercises kind of help but they hurt, especially adductor stretch.  She has no change in her pain after doing the stretch.     PAIN: NPRS: Current: 8-9/10 last night. 5-6/10 currently Best: 0/10, Worst: 7-8/10. Pain location: medial side of distal posterior thigh Pain description: sharp then gradually fades, lasts a few minutes.  Aggravating factors: turning over in the bed or lifting her R leg up when she is laying on her back.  Relieving factors: puts voltaren gel on it (unsure if it helps).      FUNCTIONAL LIMITATIONS: difficulty with sleeping, exercising, walking, going up and down stairs,  doing ADLs and IADLs, lifting an object of the floor, performing heavy activities around the home, rolling over in bed.  LEISURE: goes to the gym, traveling, exercising, going to the movies, pretty active for age.    PRECAUTIONS: None, used to be not allowed to lift anything over 30# due to aneurism that was repaired  WEIGHT BEARING RESTRICTIONS: No  FALLS:  Has patient fallen in last 6 months? 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   NEXT MD VISIT:   OBJECTIVE  HIP SPECIAL TESTS FABER: R = concordant pain at proximal adductors and pain in groin, L = painful at groin/anterior hip, limited ROM compared to R.  FADDIR: R = concordant pain at proximal adductors and pain in groin. Also with dynamic version. L = dynamic painful at proximal adductors, static just pressure in same region.  LAD: R = no change in adductor pain, pain at right  groin at hip adductor attachment, L = feels a little good but mostly nothing.                                                                                                                                  TREATMENT Therapeutic exercise: therapeutic exercises that incorporate ONE parameter at one or more areas of the body to centralize symptoms, develop strength and endurance, range of motion, and flexibility required for successful completion of functional activities.  NuStep using bilateral upper and lower extremities. For improved extremity mobility, muscular endurance, and activity tolerance; and to induce the analgesic effect of aerobic exercise, stimulate improved joint nutrition, and prepare body structures and systems for following interventions. Also to reinforce understanding of appropriate exercise intensity to help meet physical activity guidelines for health.  Seat/handle setting: 6/8 5  minutes Level: 3 Target SPM: > 100 Average SPM: 100 RPE: 5/10 (Slight difficulty holding conversation by end, with goal of 100 spm and level 3)  Standing hip adduction on hip machine, height with hole 3 showing, rotary position 8, bar on level 1.  3x15 each side at 40# RPE 5-6/10  Hooklying butterfly stretch Minimal stretch sensation of the R hip adductors so discontinued  Therapeutic activities: dynamic therapeutic activities incorporating MULTIPLE parameters or areas of the body designed to achieve improved functional performance.  Standing squat AROM with B UE support 1x10 with improved form with cuing  Standing cossack mini squat with B UE support 3x10 each side AROM   Pt required multimodal cuing for proper technique and to facilitate improved neuromuscular control, strength, range of motion, and functional ability resulting in improved performance and form.  Manual therapy: to reduce pain and tissue tension, improve range of motion, neuromodulation, in order to promote improved  ability to complete functional activities. HOOKLYING/SUPINE STM hip adductors Special tests of bilateral hips (see above) LAD through R hip 1x30 seconds PROM R hip adductor stretch with knee extended 1x29min   Trigger Point Dry Needling  Subsequent Treatment: Instructions provided previously at initial dry needling treatment.  Instructions reviewed, if requested by the patient, prior to subsequent dry needling treatment.   Patient Verbal Consent Given: Yes Education Handout Provided: Previously Provided Muscles Treated: 1 dry needle(s) .25mm x 60mm inserted with 4 sticks into right thigh adductor group muscles with patient in supine position to decrease pain and spasms along patient's right thigh and  knee region.  Electrical Stimulation Performed: No Treatment Response/Outcome: Patient with minimal sensation of deep ache and no twitching noted. No abnormal response noted. Mild (expected) bruises from dry needling performed last visit noted.    PATIENT EDUCATION:  Education details: Exercise purpose/form. Self management techniques. Dry needling Person educated: Patient Education method: Explanation, Demonstration, and Handouts Education comprehension: verbalized understanding and needs further education  HOME EXERCISE PROGRAM: Access Code: [email or web address] URL: [email or web address] Date: 12/22/2023 Prepared by: Alleen Isle  Exercises - Supine Extended Bridge on Heels  - 3 x daily - 1 sets - 3 reps - 45 seconds hold - Supine Hip Adduction Isometric with Ball  - 3 x daily - 1 sets - 3 reps - 45 seconds hold - Hip Adductors and Hamstring Stretch with Strap  - 2 x daily - 3 reps - 45 seconds hold  ASSESSMENT:  CLINICAL IMPRESSION: Patient arrives arrives with report she feels PT is helping but describes very similar nighttime and morning pain. She feels the pain is not as bothersome during the day. Hip special tests completed and reproduced groin and adductor pain at least  halfway down the thigh. However, these tests are not well validated s/p THA. LAD did not relieve pain but did increase pain at the proximal adductor attachment briefly. Session repeated dry needling to the adductors with no muscle twitches and minimal ability of patient to feel the needle while inserted including during pistoning. Patient tolerated gentle strengthening of hip adductors. She had difficulty getting full ROM and with squat mechanics with cossack squat. Would benefit from increasing load on the hip machine. Patient would benefit from continued management of limiting condition by skilled physical therapist to address remaining impairments and functional limitations to work towards stated goals and return to PLOF or maximal functional independence.   From initial PT evaluation on 12/10/2023:  Patient is a 78 y.o. female referred to outpatient physical therapy with a medical diagnosis of status post right partial knee replacement, hamstring tendinosis of right thigh, strain of right hip adductor muscle who presents with signs and symptoms consistent with posterior medial R thigh pain. It does not appear to be referring from the low back based on negative slump test and reflexes. Most TTP at adductors but also tender at distal medial hamstrings. Patient presents with significant pain, activity tolerance, knowledge, decreased participation in exercise (not currently meeting physical activity recommendations for health) impairments that are limiting ability to complete sleeping, exercising, walking, going up and down stairs,  doing ADLs and IADLs, lifting an object of the floor, performing heavy activities around the home, rolling over in bed without difficulty. Patient will benefit from skilled physical therapy intervention to address current body structure impairments and activity limitations to improve function and work towards goals set in current POC in order to return to prior level of function or  maximal functional improvement.    OBJECTIVE IMPAIRMENTS: decreased activity tolerance, decreased knowledge of condition, decreased mobility, difficulty walking, and pain.   ACTIVITY LIMITATIONS: lifting, sleeping, and stairs  PARTICIPATION LIMITATIONS: community activity and   difficulty with sleeping, exercising, walking, going up and down stairs,  doing ADLs and IADLs, lifting an object of the floor, performing heavy activities around the home, rolling over in bed  PERSONAL FACTORS: Age, Fitness, Past/current experiences, Time since onset of injury/illness/exacerbation, and 3+ comorbidities:   Diabetes mellitus type 2 with retinopathy (HCC); OA (osteoarthritis) of neck; History of duodenal ulcer; Seasonal allergies; Hypertension associated with diabetes (HCC); Hyperlipidemia associated with type  2 diabetes mellitus (HCC); Lichen plano-pilaris; Osteoporosis of forearm; History of joint replacement; Vitamin D  deficiency; HSV-1 (herpes simplex virus 1) infection, vaginal; Primary osteoarthritis of right knee; Diverticular disease; Malignant neoplasm of upper-outer quadrant of left breast in female, estrogen receptor positive (HCC); Diabetic retinopathy (HCC); Anemia; Carotid artery aneurysm (HCC); Chemotherapy-induced peripheral neuropathy (HCC); Peripheral edema; Aortic atherosclerosis (HCC); Atherosclerosis of coronary artery of native heart without angina pectoris; Bronchiectasis without complication (HCC); Decreased GFR; Vitamin B 12 deficiency; Leukopenia; Elevated ferritin; Overweight (BMI 25.0-29.9); Burning with urination; Abdominal pain, left lower quadrant; Acute diverticulitis; Chronic cough; Reactive airways dysfunction syndrome (HCC); Acute cough; Hypercalcemia; and Pre-syncope on their problem list. she  has a past medical history of Allergy, Anemia, Aneurysm (HCC), Blood transfusion without reported diagnosis, Cancer (HCC), Coronary artery disease, Diverticulitis, DJD (degenerative joint  disease), DM type 2 (diabetes mellitus, type 2) (HCC), F Fibrocystic breast disease, GERD (gastroesophageal reflux disease), History of chicken pox, Hypercholesteremia, Hypertension, Lichen planopilaris, Neuromuscular disorder (HCC), Sleep apnea, Urinary incontinence, and Vertigo (12/2019). she  has a past surgical history that includes Placement of breast implants (12/26/2004); Dilation and curettage of uterus (01/1994); Finger surgery (2001 & 2003); Breast surgery (02/1975); Rhinoplasty (03/1964); Foot surgery (1980); Breast surgery (10/1981); Breast surgery (07/1998); Foot surgery (07/2008); Breast surgery (01/1976); Cholecystectomy (N/A, 07/08/2016); Joint replacement R THA (04/19/2013);  Mastectomy w/ sentinel node biopsy (Bilateral, 03/08/2019); Breast implant removal (Bilateral, 03/08/2019); Replacement total knee (Left); Cataract extraction w/PHACO (Right, 02/08/2020); Cataract extraction w/PHACO (Left, 02/29/2020); Mastectomy; and Partial knee arthroplasty (Right, 12/31/2022) are also affecting patient's functional outcome.   REHAB POTENTIAL: Good  CLINICAL DECISION MAKING: Evolving/moderate complexity  EVALUATION COMPLEXITY: Moderate   GOALS: Goals reviewed with patient? No  SHORT TERM GOALS: Target date: 12/24/2023  Patient will be independent with initial home exercise program for self-management of symptoms. Baseline: Initial HEP to be provided at visit 2 as appropriate (12/10/23); Goal status: In progress   LONG TERM GOALS: Target date: 03/03/2024  Patient will be independent with a long-term home exercise program for self-management of symptoms.  Baseline: Initial HEP to be provided at visit 2 as appropriate (12/10/23); Goal status: In progress  2.  Patient will demonstrate improvement in Patient Specific Functional Scale (PSFS) of equal or greater than 8/10 points to reflect clinically significant improvement in patient's most valued functional activities. Baseline: to be  measured at visit 2 as appropriate (12/10/23); 4.5/10 (12/15/2023);  Goal status: In progress  3.  Patient will demonstrate the ability to go up and down 12 steps without concordant pain to improve her household and community mobility.  Baseline: pain with stairs (12/10/23); Goal status: In progress  4.  Patient will report NPRS equal or less than 3/10 during functional activities during the last 2 weeks to improve their abilitly to complete community, work and/or recreational activities with less limitation. Baseline: 7/10 (12/10/23); Goal status: In progress    PLAN:  PT FREQUENCY: 1-2x/week  PT DURATION: 8-12 weeks  PLANNED INTERVENTIONS: 97164- PT Re-evaluation, 97750- Physical Performance Testing, 97110-Therapeutic exercises, 97530- Therapeutic activity, [id number]- Neuromuscular re-education, 97535- Self Care, 54098- Manual therapy, [id number]- Electrical stimulation (unattended), Patient/Family education, Stair training, Dry Needling, Joint mobilization, Spinal mobilization, Cryotherapy, and Moist heat  PLAN FOR NEXT SESSION: update HEP as appropriate, progressive loading to R adductor and hamstring group as tolerated, further exam as needed. Education. Manual therapy as appropriate.    Carilyn Charles. Artemio Larry, PT, DPT 12/24/23, 4:44 PM  [facility] [address] [place],  [place] [address] P: [phone] I F: [phone]

## 2023-12-29 ENCOUNTER — Encounter: Payer: Self-pay | Admitting: Physical Therapy

## 2023-12-29 ENCOUNTER — Ambulatory Visit: Attending: Student | Admitting: Physical Therapy

## 2023-12-29 DIAGNOSIS — M79651 Pain in right thigh: Secondary | ICD-10-CM | POA: Diagnosis not present

## 2023-12-29 DIAGNOSIS — G8929 Other chronic pain: Secondary | ICD-10-CM | POA: Insufficient documentation

## 2023-12-29 DIAGNOSIS — M25561 Pain in right knee: Secondary | ICD-10-CM | POA: Diagnosis not present

## 2023-12-29 NOTE — Therapy (Signed)
 OUTPATIENT PHYSICAL THERAPY TREATMENT   Patient Name: Katie [name] MRN: [id number] DOB:04/05/1946, 78 y.o., female Today's Date: 12/29/2023  END OF SESSION:  PT End of Session - 12/29/23 1826     Visit Number 5    Number of Visits 20    Date for PT Re-Evaluation 03/03/24    Authorization Type [employer] MEDIC[employer] reporting period from 12/10/2023    Authorization Time Period auth 20 visits  4/16-6/20  Authorization #[id number]    Authorization - Visit Number 5    Authorization - Number of Visits 20    Progress Note Due on Visit 10    PT Start Time 1438    PT Stop Time 1518    PT Time Calculation (min) 40 min    Activity Tolerance Patient tolerated treatment well    Behavior During Therapy [facility] for tasks assessed/performed                 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, small, not treating-watching , it is now stented   Blood transfusion without reported diagnosis    Cancer (HCC)    breast 08/2018   Coronary artery disease    Diverticulitis    DJD (degenerative joint disease)    DM type 2 (diabetes mellitus, type 2) (HCC)    Family history of breast cancer    Fibrocystic breast disease    GERD (gastroesophageal reflux disease)    History of chicken pox    Hypercholesteremia    Hypertension    Lichen planopilaris    Neuromuscular disorder (HCC)    Sleep apnea    cpap   Urinary incontinence    Vertigo 12/2019   1 episode   Past Surgical History:  Procedure Laterality Date   BREAST IMPLANT REMOVAL Bilateral 03/08/2019   Procedure: Removal of Bilateral Breast Implants;  Surgeon: Enid Harry, MD;  Location: [facility] OR;  Service: General;  Laterality: Bilateral;   BREAST SURGERY  02/1975   Left Breast Biopsy-Fibrocystic Disease   BREAST SURGERY  10/1981   Bialteral Capsulectomy Breast Surgery   BREAST SURGERY  07/1998   Replace Bilateral Silicone Implants   BREAST SURGERY  01/1976   [place] breast  surgery to remove fibrocystic tissue and replace with silicone implants   CATARACT EXTRACTION W/PHACO Right 02/08/2020   Procedure: CATARACT EXTRACTION PHACO AND INTRAOCULAR LENS PLACEMENT (IOC) RIGHT DIABETIC;  Surgeon: Clair Crews, MD;  Location: [facility];  Service: Ophthalmology;  Laterality: Right;  3.55 0:33.7   CATARACT EXTRACTION W/PHACO Left 02/29/2020   Procedure: CATARACT EXTRACTION PHACO AND INTRAOCULAR LENS PLACEMENT (IOC) LEFT DIABETIC 2.93  00:27.1;  Surgeon: Clair Crews, MD;  Location: [facility];  Service: Ophthalmology;  Laterality: Left;  Diabetic - oral meds   CHOLECYSTECTOMY N/A 07/08/2016   Procedure: LAPAROSCOPIC CHOLECYSTECTOMY;  Surgeon: Gwyndolyn Lerner, MD;  Location: [facility] ORS;  Service: General;  Laterality: N/A;   COLONOSCOPY WITH PROPOFOL  N/A 11/07/2017   Procedure: COLONOSCOPY WITH PROPOFOL ;  Surgeon: Luke Salaam, MD;  Location: [facility];  Service: Gastroenterology;  Laterality: N/A;   DILATION AND CURETTAGE OF UTERUS  01/1994   ESOPHAGOGASTRODUODENOSCOPY (EGD) WITH PROPOFOL  N/A 12/05/2016   Procedure: ESOPHAGOGASTRODUODENOSCOPY (EGD) WITH PROPOFOL ;  Surgeon: Luke Salaam, MD;  Location: [facility] END[facility];  Service: Endoscopy;  Laterality: N/A;   FINGER SURGERY  2001 & 2003   Trigger Finger   FOOT SURGERY  1980   To Relieve  Pinched Nerve   FOOT SURGERY  07/2008   Left Foot-Plantar Fasciitis   JOINT REPLACEMENT  04/19/2013   Hip Replacement-Right   MASTECTOMY     double   MASTECTOMY W/ SENTINEL NODE BIOPSY Bilateral 03/08/2019   Procedure: BILATERAL MASTECTOMIES WITH LEFT AXILLARY SENTINEL LYMPH NODE BIOPSY AND BLUE DYE INJECTION;  Surgeon: Enid Harry, MD;  Location: [facility] OR;  Service: General;  Laterality: Bilateral;   PARTIAL KNEE ARTHROPLASTY Right 12/31/2022   Procedure: RIGHT PARTIAL KNEE ARTHROPLASTY;  Surgeon: Elner Hahn, MD;  Location: [facility] ORS;  Service: Orthopedics;  Laterality: Right;   PLACEMENT OF BREAST  IMPLANTS  12/26/2004   Replace Failed Implants   REPLACEMENT TOTAL KNEE Left    2019-   RHINOPLASTY  03/1964   To Correct Deviated Septum   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/2023   Acute diverticulitis 03/21/2023   Abdominal pain, left lower quadrant 03/16/2023   Burning with urination 03/07/2023   Overweight (BMI 25.0-29.9) 03/05/2022   Elevated ferritin 03/23/2021   Decreased GFR 02/23/2021   Vitamin B 12 deficiency 02/23/2021   Leukopenia 02/23/2021   Bronchiectasis without complication (HCC) 01/26/2020   Aortic atherosclerosis (HCC) 12/23/2019   Atherosclerosis of coronary artery of native heart without angina pectoris 12/23/2019   Peripheral edema 12/22/2019   Chemotherapy-induced peripheral neuropathy (HCC) 10/29/2019   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   Anemia 10/15/2018   Diabetic retinopathy (HCC) 09/29/2018   Family history of leukemia 09/16/2018   Family history of breast cancer    Malignant neoplasm of upper-outer quadrant of left breast in female, 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/11/2018   Family history of breast cancer in sister 08/28/2018   Diverticular disease 10/17/2017   Primary osteoarthritis of right knee 03/07/2017   HSV-1 (herpes simplex virus 1) infection, 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   Vitamin D  deficiency 10/11/2016   Family history of thyroid  disorder 04/04/2015   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   Counseling regarding end of life decision making 10/04/2014   OA (osteoarthritis) of neck 08/02/2014   History of duodenal ulcer 08/02/2014   Seasonal allergies 08/02/2014   Hypertension associated with diabetes (HCC) 08/02/2014   Hyperlipidemia associated  with type 2 diabetes mellitus (HCC) 08/02/2014   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   History of joint replacement 03/01/2014   Diabetes mellitus type 2 with retinopathy (HCC) 11/07/2009    PCP: Judithann Novas, MD  REFERRING PROVIDER: Rojelio Clement, PA   REFERRING DIAG: status post right partial knee replacement, hamstring tendinosis of right thigh, strain of right hip adductor muscle  THERAPY DIAG:  Pain in right thigh  Chronic pain of right knee  Rationale for Evaluation and Treatment: Rehabilitation  ONSET DATE: September/October 2024  SUBJECTIVE:   PERTINENT HISTORY: Patient is a 78 y.o. female who presents to outpatient physical therapy with a referral for medical diagnosis status post right partial knee replacement, hamstring tendinosis of right thigh, strain of right hip adductor muscle. This patient's chief complaints consist of pain at the distal medial right thigh, leading to the following functional deficits: difficulty with sleeping, exercising, walking, going up and down stairs,  doing ADLs and IADLs, lifting an object of the floor, performing heavy activities around the home, rolling over  in bed. Relevant past medical history and comorbidities include the following: she has Diabetes mellitus type 2 with retinopathy (HCC); OA (osteoarthritis) of neck; History of duodenal ulcer; Seasonal allergies; Hypertension associated with diabetes (HCC); Hyperlipidemia associated with type 2 diabetes mellitus (HCC); Lichen plano-pilaris; Osteoporosis of forearm; History of joint replacement; Vitamin D  deficiency; HSV-1 (herpes simplex virus 1) infection, vaginal; Primary osteoarthritis of right knee; Diverticular disease; Malignant neoplasm of upper-outer quadrant of left breast in female, estrogen receptor positive (HCC); Diabetic retinopathy (HCC); Anemia; Carotid artery aneurysm (HCC); Chemotherapy-induced peripheral neuropathy (HCC); Peripheral edema; Aortic atherosclerosis (HCC);  Atherosclerosis of coronary artery of native heart without angina pectoris; Bronchiectasis without complication (HCC); Decreased GFR; Vitamin B 12 deficiency; Leukopenia; Elevated ferritin; Overweight (BMI 25.0-29.9); Burning with urination; Abdominal pain, left lower quadrant; Acute diverticulitis; Chronic cough; Reactive airways dysfunction syndrome (HCC); Acute cough; Hypercalcemia; and Pre-syncope on their problem list. she  has a past medical history of Allergy, Anemia, Aneurysm (HCC), Blood transfusion without reported diagnosis, Cancer (HCC), Coronary artery disease, Diverticulitis, DJD (degenerative joint disease), DM type 2 (diabetes mellitus, type 2) (HCC), F Fibrocystic breast disease, GERD (gastroesophageal reflux disease), History of chicken pox, Hypercholesteremia, Hypertension, Lichen planopilaris, Neuromuscular disorder (HCC), Sleep apnea, Urinary incontinence, and Vertigo (12/2019). she  has a past surgical history that includes Placement of breast implants (12/26/2004); Dilation and curettage of uterus (01/1994); Finger surgery (2001 & 2003); Breast surgery (02/1975); Rhinoplasty (03/1964); Foot surgery (1980); Breast surgery (10/1981); Breast surgery (07/1998); Foot surgery (07/2008); Breast surgery (01/1976); Cholecystectomy (N/A, 07/08/2016); Joint replacement R THA (04/19/2013);  Mastectomy w/ sentinel node biopsy (Bilateral, 03/08/2019); Breast implant removal (Bilateral, 03/08/2019); Replacement total knee (Left); Cataract extraction w/PHACO (Right, 02/08/2020); Cataract extraction w/PHACO (Left, 02/29/2020); Mastectomy; and Partial knee arthroplasty (Right, 12/31/2022). Patient denies hx of stroke, seizures, unexplained weight loss, unexplained changes in bowel or bladder problems, unexplained stumbling or dropping things, and spinal surgery. Has osteoporosis in forearm (spine steady), almost 5 years out from breast cancer, has some lung damage from chemo, and has CAD but not cardiac events  .  Exercise history: Usually goes to the gym pretty regularly with her husband. Mostly does the Nustep since her knee surgery. Also has done the treadmill. Avoids the machines if her concordant pain is actively hurting. She is getting a new recumbent bike.   SUBJECTIVE STATEMENT: Patient states she had some pain this morning and when getting up. She is also having pain at night and when moving around. She also had pain over her bilateral SIJ region after last visit. She thinks when she does the exercises it makes her sore there. She thinks dry needling is definitely helping, maybe for a day or 2.    PAIN: NPRS: Current: 6/10 medial side of distal posterior thigh Best: 0/10, Worst: 7-8/10. Pain location: medial side of distal posterior thigh Pain description: sharp then gradually fades, lasts a few minutes.  Aggravating factors: turning over in the bed or lifting her R leg up when she is laying on her back.  Relieving factors: puts voltaren gel on it (unsure if it helps).      FUNCTIONAL LIMITATIONS: difficulty with sleeping, exercising, walking, going up and down stairs,  doing ADLs and IADLs, lifting an object of the floor, performing heavy activities around the home, rolling over in bed.  LEISURE: goes to the gym, traveling, exercising, going to the movies, pretty active for age.    PRECAUTIONS: None, used to be not allowed to lift anything over 30# due to aneurism  that was repaired  WEIGHT BEARING RESTRICTIONS: No  FALLS:  Has patient fallen in last 6 months? 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   NEXT MD VISIT:   OBJECTIVE                                                                                                                              TREATMENT Therapeutic exercise: therapeutic exercises that incorporate ONE parameter at one or more  areas of the body to centralize symptoms, develop strength and endurance, range of motion, and flexibility required for successful completion of functional activities.  NuStep using bilateral upper and lower extremities. For improved extremity mobility, muscular endurance, and activity tolerance; and to induce the analgesic effect of aerobic exercise, stimulate improved joint nutrition, and prepare body structures and systems for following interventions. Also to reinforce understanding of appropriate exercise intensity to help meet physical activity guidelines for health.  Seat/handle setting: 6/8 5  minutes Level: 3 Target SPM: > 100 Average SPM: 94 RPE: 8/10 (Slight difficulty holding conversation by end)  Standing hip adduction on hip machine, height with hole 3 showing, rotary position 7, bar on level 1.  3x15 each side at 55# Moderate hard  Standing hip abduction on hip machine, height with hole 3 showing, rotary position 8, bar on level 1.  3x15 each side at 55# Hard!  Hip hikes off of 2x4 board with UE support on standing side.  1x20 each side Added to HEP  Therapeutic activities: dynamic therapeutic activities incorporating MULTIPLE parameters or areas of the body designed to achieve improved functional performance.  Standing lateral lunge to 4 inch step with B UE support 3x15 each side AROM  Sled push  6x25 feet with sled only (75#) PT provided assistance with turning sled   Pt required multimodal cuing for proper technique and to facilitate improved neuromuscular control, strength, range of motion, and functional ability resulting in improved performance and form.  PATIENT EDUCATION:  Education details: Exercise purpose/form. Self management techniques. Dry needling Person educated: Patient Education method: Explanation, Demonstration, and Handouts Education comprehension: verbalized understanding and needs further education  HOME EXERCISE PROGRAM: Access Code:  [email or web address] URL: [email or web address] Date: 12/29/2023 Prepared by: Alleen Isle  Exercises - Supine Extended Bridge on Heels  - 3 x daily - 1 sets - 3 reps - 45 seconds hold - Supine Hip Adduction Isometric with Ball  - 3 x daily - 1 sets - 3 reps - 45 seconds hold - Hip Adductors and Hamstring Stretch with Strap  - 2 x daily - 3 reps - 45 seconds hold - Hip Hikes off step  - 1 x daily - 3 sets - 15 reps  ASSESSMENT:  CLINICAL IMPRESSION: Patient without significant improvement since last PT session. Continued with hip strengthening and progressed it to be more multi-directional. Patient  tolerated well with significant fatigued noted in lateral > medial thighs. Patient would benefit from continued management of limiting condition by skilled physical therapist to address remaining impairments and functional limitations to work towards stated goals and return to PLOF or maximal functional independence.    From initial PT evaluation on 12/10/2023:  Patient is a 78 y.o. female referred to outpatient physical therapy with a medical diagnosis of status post right partial knee replacement, hamstring tendinosis of right thigh, strain of right hip adductor muscle who presents with signs and symptoms consistent with posterior medial R thigh pain. It does not appear to be referring from the low back based on negative slump test and reflexes. Most TTP at adductors but also tender at distal medial hamstrings. Patient presents with significant pain, activity tolerance, knowledge, decreased participation in exercise (not currently meeting physical activity recommendations for health) impairments that are limiting ability to complete sleeping, exercising, walking, going up and down stairs,  doing ADLs and IADLs, lifting an object of the floor, performing heavy activities around the home, rolling over in bed without difficulty. Patient will benefit from skilled physical therapy intervention to address  current body structure impairments and activity limitations to improve function and work towards goals set in current POC in order to return to prior level of function or maximal functional improvement.    OBJECTIVE IMPAIRMENTS: decreased activity tolerance, decreased knowledge of condition, decreased mobility, difficulty walking, and pain.   ACTIVITY LIMITATIONS: lifting, sleeping, and stairs  PARTICIPATION LIMITATIONS: community activity and   difficulty with sleeping, exercising, walking, going up and down stairs,  doing ADLs and IADLs, lifting an object of the floor, performing heavy activities around the home, rolling over in bed  PERSONAL FACTORS: Age, Fitness, Past/current experiences, Time since onset of injury/illness/exacerbation, and 3+ comorbidities:   Diabetes mellitus type 2 with retinopathy (HCC); OA (osteoarthritis) of neck; History of duodenal ulcer; Seasonal allergies; Hypertension associated with diabetes (HCC); Hyperlipidemia associated with type 2 diabetes mellitus (HCC); Lichen plano-pilaris; Osteoporosis of forearm; History of joint replacement; Vitamin D  deficiency; HSV-1 (herpes simplex virus 1) infection, vaginal; Primary osteoarthritis of right knee; Diverticular disease; Malignant neoplasm of upper-outer quadrant of left breast in female, estrogen receptor positive (HCC); Diabetic retinopathy (HCC); Anemia; Carotid artery aneurysm (HCC); Chemotherapy-induced peripheral neuropathy (HCC); Peripheral edema; Aortic atherosclerosis (HCC); Atherosclerosis of coronary artery of native heart without angina pectoris; Bronchiectasis without complication (HCC); Decreased GFR; Vitamin B 12 deficiency; Leukopenia; Elevated ferritin; Overweight (BMI 25.0-29.9); Burning with urination; Abdominal pain, left lower quadrant; Acute diverticulitis; Chronic cough; Reactive airways dysfunction syndrome (HCC); Acute cough; Hypercalcemia; and Pre-syncope on their problem list. she  has a past medical  history of Allergy, Anemia, Aneurysm (HCC), Blood transfusion without reported diagnosis, Cancer (HCC), Coronary artery disease, Diverticulitis, DJD (degenerative joint disease), DM type 2 (diabetes mellitus, type 2) (HCC), F Fibrocystic breast disease, GERD (gastroesophageal reflux disease), History of chicken pox, Hypercholesteremia, Hypertension, Lichen planopilaris, Neuromuscular disorder (HCC), Sleep apnea, Urinary incontinence, and Vertigo (12/2019). she  has a past surgical history that includes Placement of breast implants (12/26/2004); Dilation and curettage of uterus (01/1994); Finger surgery (2001 & 2003); Breast surgery (02/1975); Rhinoplasty (03/1964); Foot surgery (1980); Breast surgery (10/1981); Breast surgery (07/1998); Foot surgery (07/2008); Breast surgery (01/1976); Cholecystectomy (N/A, 07/08/2016); Joint replacement R THA (04/19/2013);  Mastectomy w/ sentinel node biopsy (Bilateral, 03/08/2019); Breast implant removal (Bilateral, 03/08/2019); Replacement total knee (Left); Cataract extraction w/PHACO (Right, 02/08/2020); Cataract extraction w/PHACO (Left, 02/29/2020); Mastectomy; and Partial knee arthroplasty (Right, 12/31/2022) are  also affecting patient's functional outcome.   REHAB POTENTIAL: Good  CLINICAL DECISION MAKING: Evolving/moderate complexity  EVALUATION COMPLEXITY: Moderate   GOALS: Goals reviewed with patient? No  SHORT TERM GOALS: Target date: 12/24/2023  Patient will be independent with initial home exercise program for self-management of symptoms. Baseline: Initial HEP to be provided at visit 2 as appropriate (12/10/23); Goal status: In progress   LONG TERM GOALS: Target date: 03/03/2024  Patient will be independent with a long-term home exercise program for self-management of symptoms.  Baseline: Initial HEP to be provided at visit 2 as appropriate (12/10/23); Goal status: In progress  2.  Patient will demonstrate improvement in Patient Specific Functional  Scale (PSFS) of equal or greater than 8/10 points to reflect clinically significant improvement in patient's most valued functional activities. Baseline: to be measured at visit 2 as appropriate (12/10/23); 4.5/10 (12/15/2023);  Goal status: In progress  3.  Patient will demonstrate the ability to go up and down 12 steps without concordant pain to improve her household and community mobility.  Baseline: pain with stairs (12/10/23); Goal status: In progress  4.  Patient will report NPRS equal or less than 3/10 during functional activities during the last 2 weeks to improve their abilitly to complete community, work and/or recreational activities with less limitation. Baseline: 7/10 (12/10/23); Goal status: In progress    PLAN:  PT FREQUENCY: 1-2x/week  PT DURATION: 8-12 weeks  PLANNED INTERVENTIONS: 97164- PT Re-evaluation, 97750- Physical Performance Testing, 97110-Therapeutic exercises, 97530- Therapeutic activity, [id number]- Neuromuscular re-education, 97535- Self Care, 16109- Manual therapy, [id number]- Electrical stimulation (unattended), Patient/Family education, Stair training, Dry Needling, Joint mobilization, Spinal mobilization, Cryotherapy, and Moist heat  PLAN FOR NEXT SESSION: update HEP as appropriate, progressive loading to R adductor and hamstring group as tolerated, further exam as needed. Education. Manual therapy as appropriate.    Carilyn Charles. Artemio Larry, PT, DPT 12/29/23, 6:29 PM  [facility] [address] [place], [place] [address] P: [phone] I F: [phone]

## 2023-12-30 DIAGNOSIS — C50911 Malignant neoplasm of unspecified site of right female breast: Secondary | ICD-10-CM | POA: Diagnosis not present

## 2023-12-30 DIAGNOSIS — C50912 Malignant neoplasm of unspecified site of left female breast: Secondary | ICD-10-CM | POA: Diagnosis not present

## 2023-12-31 ENCOUNTER — Encounter: Payer: Self-pay | Admitting: Physical Therapy

## 2023-12-31 ENCOUNTER — Ambulatory Visit: Admitting: Physical Therapy

## 2023-12-31 DIAGNOSIS — M79651 Pain in right thigh: Secondary | ICD-10-CM | POA: Diagnosis not present

## 2023-12-31 DIAGNOSIS — M25561 Pain in right knee: Secondary | ICD-10-CM | POA: Diagnosis not present

## 2023-12-31 DIAGNOSIS — G8929 Other chronic pain: Secondary | ICD-10-CM

## 2023-12-31 NOTE — Therapy (Unsigned)
 OUTPATIENT PHYSICAL THERAPY TREATMENT   Patient Name: Katie [name] MRN: [id number] DOB:Sep 30, 1945, 78 y.o., female Today's Date: 12/31/2023  END OF SESSION:  PT End of Session - 12/31/23 1123     Visit Number 6    Number of Visits 20    Date for PT Re-Evaluation 03/03/24    Authorization Type [employer]IC[employer] reporting period from 12/10/2023    Authorization Time Period auth 20 visits  4/16-6/20  Authorization #[id number]    Authorization - Visit Number 6    Authorization - Number of Visits 20    Progress Note Due on Visit 10    PT Start Time 1117    PT Stop Time 1157    PT Time Calculation (min) 40 min    Activity Tolerance Patient tolerated treatment well    Behavior During Therapy 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, small, not treating-watching , it is now stented   Blood transfusion without reported diagnosis    Cancer (HCC)    breast 08/2018   Coronary artery disease    Diverticulitis    DJD (degenerative joint disease)    DM type 2 (diabetes mellitus, type 2) (HCC)    Family history of breast cancer    Fibrocystic breast disease    GERD (gastroesophageal reflux disease)    History of chicken pox    Hypercholesteremia    Hypertension    Lichen planopilaris    Neuromuscular disorder (HCC)    Sleep apnea    cpap   Urinary incontinence    Vertigo 12/2019   1 episode   Past Surgical History:  Procedure Laterality Date   BREAST IMPLANT REMOVAL Bilateral 03/08/2019   Procedure: Removal of Bilateral Breast Implants;  Surgeon: Enid Harry, MD;  Location: [facility] OR;  Service: General;  Laterality: Bilateral;   BREAST SURGERY  02/1975   Left Breast Biopsy-Fibrocystic Disease   BREAST SURGERY  10/1981   Bialteral Capsulectomy Breast Surgery   BREAST SURGERY  07/1998   Replace Bilateral Silicone Implants   BREAST SURGERY  01/1976   Bilater breast surgery  to remove fibrocystic tissue and replace with silicone implants   CATARACT EXTRACTION W/PHACO Right 02/08/2020   Procedure: CATARACT EXTRACTION PHACO AND INTRAOCULAR LENS PLACEMENT (IOC) RIGHT DIABETIC;  Surgeon: Clair Crews, MD;  Location: [facility];  Service: Ophthalmology;  Laterality: Right;  3.55 0:33.7   CATARACT EXTRACTION W/PHACO Left 02/29/2020   Procedure: CATARACT EXTRACTION PHACO AND INTRAOCULAR LENS PLACEMENT (IOC) LEFT DIABETIC 2.93  00:27.1;  Surgeon: Clair Crews, MD;  Location: [facility];  Service: Ophthalmology;  Laterality: Left;  Diabetic - oral meds   CHOLECYSTECTOMY N/A 07/08/2016   Procedure: LAPAROSCOPIC CHOLECYSTECTOMY;  Surgeon: Gwyndolyn Lerner, MD;  Location: [facility] ORS;  Service: General;  Laterality: N/A;   COLONOSCOPY WITH PROPOFOL  N/A 11/07/2017   Procedure: COLONOSCOPY WITH PROPOFOL ;  Surgeon: Luke Salaam, MD;  Location: [facility] END[facility]COPY;  Service: Gastroenterology;  Laterality: N/A;   DILATION AND CURETTAGE OF UTERUS  01/1994   ESOPHAGOGASTRODUODENOSCOPY (EGD) WITH PROPOFOL  N/A 12/05/2016   Procedure: ESOPHAGOGASTRODUODENOSCOPY (EGD) WITH PROPOFOL ;  Surgeon: Luke Salaam, MD;  Location: [facility] ENDOSCOPY;  Service: Endoscopy;  Laterality: N/A;   FINGER SURGERY  2001 & 2003   Trigger Finger   FOOT SURGERY  1980   To Relieve Pinched Nerve  FOOT SURGERY  07/2008   Left Foot-Plantar Fasciitis   JOINT REPLACEMENT  04/19/2013   Hip Replacement-Right   MASTECTOMY     double   MASTECTOMY W/ SENTINEL NODE BIOPSY Bilateral 03/08/2019   Procedure: BILATERAL MASTECTOMIES WITH LEFT AXILLARY SENTINEL LYMPH NODE BIOPSY AND BLUE DYE INJECTION;  Surgeon: Enid Harry, MD;  Location: [facility] OR;  Service: General;  Laterality: Bilateral;   PARTIAL KNEE ARTHROPLASTY Right 12/31/2022   Procedure: RIGHT PARTIAL KNEE ARTHROPLASTY;  Surgeon: Elner Hahn, MD;  Location: [facility] ORS;  Service: Orthopedics;  Laterality: Right;   PLACEMENT OF BREAST IMPLANTS   12/26/2004   Replace Failed Implants   REPLACEMENT TOTAL KNEE Left    2019-   RHINOPLASTY  03/1964   To Correct Deviated Septum   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/2023   Acute diverticulitis 03/21/2023   Abdominal pain, left lower quadrant 03/16/2023   Burning with urination 03/07/2023   Overweight (BMI 25.0-29.9) 03/05/2022   Elevated ferritin 03/23/2021   Decreased GFR 02/23/2021   Vitamin B 12 deficiency 02/23/2021   Leukopenia 02/23/2021   Bronchiectasis without complication (HCC) 01/26/2020   Aortic atherosclerosis (HCC) 12/23/2019   Atherosclerosis of coronary artery of native heart without angina pectoris 12/23/2019   Peripheral edema 12/22/2019   Chemotherapy-induced peripheral neuropathy (HCC) 10/29/2019   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   Anemia 10/15/2018   Diabetic retinopathy (HCC) 09/29/2018   Family history of leukemia 09/16/2018   Family history of breast cancer    Malignant neoplasm of upper-outer quadrant of left breast in female, 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/11/2018   Family history of breast cancer in sister 08/28/2018   Diverticular disease 10/17/2017   Primary osteoarthritis of right knee 03/07/2017   HSV-1 (herpes simplex virus 1) infection, 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   Vitamin D  deficiency 10/11/2016   Family history of thyroid  disorder 04/04/2015   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   Counseling regarding end of life decision making 10/04/2014   OA (osteoarthritis) of neck 08/02/2014   History of duodenal ulcer 08/02/2014   Seasonal allergies 08/02/2014   Hypertension associated with diabetes (HCC) 08/02/2014   Hyperlipidemia associated with type 2  diabetes mellitus (HCC) 08/02/2014   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   History of joint replacement 03/01/2014   Diabetes mellitus type 2 with retinopathy (HCC) 11/07/2009    PCP: Judithann Novas, MD  REFERRING PROVIDER: Rojelio Clement, PA   REFERRING DIAG: status post right partial knee replacement, hamstring tendinosis of right thigh, strain of right hip adductor muscle  THERAPY DIAG:  Pain in right thigh  Chronic pain of right knee  Rationale for Evaluation and Treatment: Rehabilitation  ONSET DATE: September/October 2024  SUBJECTIVE:   PERTINENT HISTORY: Patient is a 78 y.o. female who presents to outpatient physical therapy with a referral for medical diagnosis status post right partial knee replacement, hamstring tendinosis of right thigh, strain of right hip adductor muscle. This patient's chief complaints consist of pain at the distal medial right thigh, leading to the following functional deficits: difficulty with sleeping, exercising, walking, going up and down stairs,  doing ADLs and IADLs, lifting an object of the floor, performing heavy activities around the home, rolling over in bed. Relevant past  medical history and comorbidities include the following: she has Diabetes mellitus type 2 with retinopathy (HCC); OA (osteoarthritis) of neck; History of duodenal ulcer; Seasonal allergies; Hypertension associated with diabetes (HCC); Hyperlipidemia associated with type 2 diabetes mellitus (HCC); Lichen plano-pilaris; Osteoporosis of forearm; History of joint replacement; Vitamin D  deficiency; HSV-1 (herpes simplex virus 1) infection, vaginal; Primary osteoarthritis of right knee; Diverticular disease; Malignant neoplasm of upper-outer quadrant of left breast in female, estrogen receptor positive (HCC); Diabetic retinopathy (HCC); Anemia; Carotid artery aneurysm (HCC); Chemotherapy-induced peripheral neuropathy (HCC); Peripheral edema; Aortic atherosclerosis (HCC); Atherosclerosis  of coronary artery of native heart without angina pectoris; Bronchiectasis without complication (HCC); Decreased GFR; Vitamin B 12 deficiency; Leukopenia; Elevated ferritin; Overweight (BMI 25.0-29.9); Burning with urination; Abdominal pain, left lower quadrant; Acute diverticulitis; Chronic cough; Reactive airways dysfunction syndrome (HCC); Acute cough; Hypercalcemia; and Pre-syncope on their problem list. she  has a past medical history of Allergy, Anemia, Aneurysm (HCC), Blood transfusion without reported diagnosis, Cancer (HCC), Coronary artery disease, Diverticulitis, DJD (degenerative joint disease), DM type 2 (diabetes mellitus, type 2) (HCC), F Fibrocystic breast disease, GERD (gastroesophageal reflux disease), History of chicken pox, Hypercholesteremia, Hypertension, Lichen planopilaris, Neuromuscular disorder (HCC), Sleep apnea, Urinary incontinence, and Vertigo (12/2019). she  has a past surgical history that includes Placement of breast implants (12/26/2004); Dilation and curettage of uterus (01/1994); Finger surgery (2001 & 2003); Breast surgery (02/1975); Rhinoplasty (03/1964); Foot surgery (1980); Breast surgery (10/1981); Breast surgery (07/1998); Foot surgery (07/2008); Breast surgery (01/1976); Cholecystectomy (N/A, 07/08/2016); Joint replacement R THA (04/19/2013);  Mastectomy w/ sentinel node biopsy (Bilateral, 03/08/2019); Breast implant removal (Bilateral, 03/08/2019); Replacement total knee (Left); Cataract extraction w/PHACO (Right, 02/08/2020); Cataract extraction w/PHACO (Left, 02/29/2020); Mastectomy; and Partial knee arthroplasty (Right, 12/31/2022). Patient denies hx of stroke, seizures, unexplained weight loss, unexplained changes in bowel or bladder problems, unexplained stumbling or dropping things, and spinal surgery. Has osteoporosis in forearm (spine steady), almost 5 years out from breast cancer, has some lung damage from chemo, and has CAD but not cardiac events .  Exercise  history: Usually goes to the gym pretty regularly with her husband. Mostly does the Nustep since her knee surgery. Also has done the treadmill. Avoids the machines if her concordant pain is actively hurting. She is getting a new recumbent bike.   SUBJECTIVE STATEMENT: Patient states she was really sore in her hips and thighs yesterday but that is feeling better today. She got a COVID19 booster yesterday and felt some nausea and R arm soreness but feels fine today. She is getting a new recumbent bike at home today to replace one that broke (she is excited about this). She went to the gym this morning and used the nustep for 30 min already this morning. She states her concordant pain is better than at last PT visit.    PAIN: NPRS: Current: 5/10 medial side of distal posterior thigh "not too bad" Best: 0/10, Worst: 7-8/10. Pain location: medial side of distal posterior thigh Pain description: sharp then gradually fades, lasts a few minutes.  Aggravating factors: turning over in the bed or lifting her R leg up when she is laying on her back.  Relieving factors: puts voltaren gel on it (unsure if it helps).      FUNCTIONAL LIMITATIONS: difficulty with sleeping, exercising, walking, going up and down stairs,  doing ADLs and IADLs, lifting an object of the floor, performing heavy activities around the home, rolling over in bed.  LEISURE: goes to the gym, traveling, exercising, going to  the movies, pretty active for age.    PRECAUTIONS: None, used to be not allowed to lift anything over 30# due to aneurism that was repaired  WEIGHT BEARING RESTRICTIONS: No  FALLS:  Has patient fallen in last 6 months? 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   NEXT MD VISIT:   OBJECTIVE                                                                                                                               TREATMENT Therapeutic exercise: therapeutic exercises that incorporate ONE parameter at one or more areas of the body to centralize symptoms, develop strength and endurance, range of motion, and flexibility required for successful completion of functional activities.  NuStep using bilateral upper and lower extremities. For improved extremity mobility, muscular endurance, and activity tolerance; and to induce the analgesic effect of aerobic exercise, stimulate improved joint nutrition, and prepare body structures and systems for following interventions. Also to reinforce understanding of appropriate exercise intensity to help meet physical activity guidelines for health.  Seat/handle setting: 6/8 6  minutes Level: 3 Target SPM: > 100 Average SPM: 86 RPE: 7-8/10 (Slight difficulty holding conversation by end)  Hip hikes off edge of step with UE support on standing side.  1x20 each side  Superset: Standing hip adduction on hip machine, height with hole 3 showing, rotary position 7, bar on level 1.  3x15 each side at 55# Moderate hard  Standing hip abduction on hip machine, height with hole 3 showing, rotary position 8, bar on level 1.  3x15 each side at 55# Hard!  Therapeutic activities: dynamic therapeutic activities incorporating MULTIPLE parameters or areas of the body designed to achieve improved functional performance.  Standing lateral lunge to 4 inch step with B UE support 3x15 each side AROM  Sled push  6x25 feet with sled only (75#) PT provided assistance with turning sled   Pt required multimodal cuing for proper technique and to facilitate improved neuromuscular control, strength, range of motion, and functional ability resulting in improved performance and form.  PATIENT EDUCATION:  Education details: Exercise purpose/form. Self management techniques. Dry needling Person educated: Patient Education method: Explanation, Demonstration, and  Handouts Education comprehension: verbalized understanding and needs further education  HOME EXERCISE PROGRAM: Access Code: [email or web address] URL: [email or web address] Date: 12/29/2023 Prepared by: Alleen Isle  Exercises - Supine Extended Bridge on Heels  - 3 x daily - 1 sets - 3 reps - 45 seconds hold - Supine Hip Adduction Isometric with Ball  - 3 x daily - 1 sets - 3 reps - 45 seconds hold - Hip Adductors and Hamstring Stretch with Strap  - 2 x daily - 3 reps - 45 seconds hold - Hip Hikes off step  - 1 x daily - 3 sets - 15 reps  ASSESSMENT:  CLINICAL IMPRESSION: Patient with significant soreness following last PT session, so did not advance exercises. Patient may benefit from adding hamstring exercises to program. She tolerated today's session well with appropriate fatigue without exacerbation of symptoms. She may benefit from an updated HEP. Plan to continue to work on multi-directional hip and functional strengthening. Patient would benefit from continued management of limiting condition by skilled physical therapist to address remaining impairments and functional limitations to work towards stated goals and return to PLOF or maximal functional independence.   From initial PT evaluation on 12/10/2023:  Patient is a 78 y.o. female referred to outpatient physical therapy with a medical diagnosis of status post right partial knee replacement, hamstring tendinosis of right thigh, strain of right hip adductor muscle who presents with signs and symptoms consistent with posterior medial R thigh pain. It does not appear to be referring from the low back based on negative slump test and reflexes. Most TTP at adductors but also tender at distal medial hamstrings. Patient presents with significant pain, activity tolerance, knowledge, decreased participation in exercise (not currently meeting physical activity recommendations for health) impairments that are limiting ability to complete sleeping,  exercising, walking, going up and down stairs,  doing ADLs and IADLs, lifting an object of the floor, performing heavy activities around the home, rolling over in bed without difficulty. Patient will benefit from skilled physical therapy intervention to address current body structure impairments and activity limitations to improve function and work towards goals set in current POC in order to return to prior level of function or maximal functional improvement.    OBJECTIVE IMPAIRMENTS: decreased activity tolerance, decreased knowledge of condition, decreased mobility, difficulty walking, and pain.   ACTIVITY LIMITATIONS: lifting, sleeping, and stairs  PARTICIPATION LIMITATIONS: community activity and  difficulty with sleeping, exercising, walking, going up and down stairs,  doing ADLs and IADLs, lifting an object of the floor, performing heavy activities around the home, rolling over in bed  PERSONAL FACTORS: Age, Fitness, Past/current experiences, Time since onset of injury/illness/exacerbation, and 3+ comorbidities:  Diabetes mellitus type 2 with retinopathy (HCC); OA (osteoarthritis) of neck; History of duodenal ulcer; Seasonal allergies; Hypertension associated with diabetes (HCC); Hyperlipidemia associated with type 2 diabetes mellitus (HCC); Lichen plano-pilaris; Osteoporosis of forearm; History of joint replacement; Vitamin D  deficiency; HSV-1 (herpes simplex virus 1) infection, vaginal; Primary osteoarthritis of right knee; Diverticular disease; Malignant neoplasm of upper-outer quadrant of left breast in female, estrogen receptor positive (HCC); Diabetic retinopathy (HCC); Anemia; Carotid artery aneurysm (HCC); Chemotherapy-induced peripheral neuropathy (HCC); Peripheral edema; Aortic atherosclerosis (HCC); Atherosclerosis of coronary artery of native heart without angina pectoris; Bronchiectasis without complication (HCC); Decreased GFR; Vitamin B 12 deficiency; Leukopenia; Elevated ferritin;  Overweight (BMI 25.0-29.9); Burning with urination; Abdominal pain, left lower quadrant; Acute diverticulitis; Chronic cough; Reactive airways dysfunction syndrome (HCC); Acute cough; Hypercalcemia; and Pre-syncope on their problem list. she  has a past medical history of Allergy, Anemia, Aneurysm (HCC), Blood transfusion without reported diagnosis, Cancer (HCC), Coronary artery disease, Diverticulitis, DJD (degenerative joint disease), DM type 2 (diabetes mellitus, type 2) (HCC), F Fibrocystic breast disease, GERD (gastroesophageal reflux disease), History of chicken pox, Hypercholesteremia, Hypertension, Lichen planopilaris, Neuromuscular disorder (HCC), Sleep apnea, Urinary incontinence, and Vertigo (12/2019). she  has a past surgical history that includes Placement of breast implants (12/26/2004); Dilation and curettage of uterus (01/1994); Finger surgery (2001 & 2003); Breast surgery (02/1975); Rhinoplasty (03/1964); Foot surgery (1980); Breast surgery (10/1981); Breast surgery (07/1998); Foot surgery (07/2008); Breast surgery (01/1976); Cholecystectomy (N/A, 07/08/2016);  Joint replacement R THA (04/19/2013);  Mastectomy w/ sentinel node biopsy (Bilateral, 03/08/2019); Breast implant removal (Bilateral, 03/08/2019); Replacement total knee (Left); Cataract extraction w/PHACO (Right, 02/08/2020); Cataract extraction w/PHACO (Left, 02/29/2020); Mastectomy; and Partial knee arthroplasty (Right, 12/31/2022) are also affecting patient's functional outcome.   REHAB POTENTIAL: Good  CLINICAL DECISION MAKING: Evolving/moderate complexity  EVALUATION COMPLEXITY: Moderate   GOALS: Goals reviewed with patient? No  SHORT TERM GOALS: Target date: 12/24/2023  Patient will be independent with initial home exercise program for self-management of symptoms. Baseline: Initial HEP to be provided at visit 2 as appropriate (12/10/23); Goal status: In progress   LONG TERM GOALS: Target date: 03/03/2024  Patient will be  independent with a long-term home exercise program for self-management of symptoms.  Baseline: Initial HEP to be provided at visit 2 as appropriate (12/10/23); Goal status: In progress  2.  Patient will demonstrate improvement in Patient Specific Functional Scale (PSFS) of equal or greater than 8/10 points to reflect clinically significant improvement in patient's most valued functional activities. Baseline: to be measured at visit 2 as appropriate (12/10/23); 4.5/10 (12/15/2023);  Goal status: In progress  3.  Patient will demonstrate the ability to go up and down 12 steps without concordant pain to improve her household and community mobility.  Baseline: pain with stairs (12/10/23); Goal status: In progress  4.  Patient will report NPRS equal or less than 3/10 during functional activities during the last 2 weeks to improve their abilitly to complete community, work and/or recreational activities with less limitation. Baseline: 7/10 (12/10/23); Goal status: In progress    PLAN:  PT FREQUENCY: 1-2x/week  PT DURATION: 8-12 weeks  PLANNED INTERVENTIONS: 97164- PT Re-evaluation, 97750- Physical Performance Testing, 97110-Therapeutic exercises, 97530- Therapeutic activity, [id number]- Neuromuscular re-education, 97535- Self Care, 40981- Manual therapy, [id number]- Electrical stimulation (unattended), Patient/Family education, Stair training, Dry Needling, Joint mobilization, Spinal mobilization, Cryotherapy, and Moist heat  PLAN FOR NEXT SESSION: update HEP as appropriate, progressive loading to R adductor and hamstring group as tolerated, further exam as needed. Education. Manual therapy as appropriate.    Carilyn Charles. Artemio Larry, PT, DPT 12/31/23, 8:18 PM  [facility] [address] [place], [place] [address] P: [phone] I F: [phone]

## 2024-01-02 DIAGNOSIS — M76891 Other specified enthesopathies of right lower limb, excluding foot: Secondary | ICD-10-CM | POA: Diagnosis not present

## 2024-01-02 DIAGNOSIS — Z96651 Presence of right artificial knee joint: Secondary | ICD-10-CM | POA: Diagnosis not present

## 2024-01-02 DIAGNOSIS — M1711 Unilateral primary osteoarthritis, right knee: Secondary | ICD-10-CM | POA: Diagnosis not present

## 2024-01-04 DIAGNOSIS — G4733 Obstructive sleep apnea (adult) (pediatric): Secondary | ICD-10-CM | POA: Diagnosis not present

## 2024-01-05 ENCOUNTER — Ambulatory Visit

## 2024-01-05 DIAGNOSIS — M79651 Pain in right thigh: Secondary | ICD-10-CM

## 2024-01-05 DIAGNOSIS — G8929 Other chronic pain: Secondary | ICD-10-CM | POA: Diagnosis not present

## 2024-01-05 DIAGNOSIS — M25561 Pain in right knee: Secondary | ICD-10-CM | POA: Diagnosis not present

## 2024-01-05 NOTE — Therapy (Signed)
 OUTPATIENT PHYSICAL THERAPY TREATMENT   Patient Name: Katie [name] MRN: [id number] DOB:12/27/1945, 78 y.o., female Today's Date: 01/05/2024  END OF SESSION:  PT End of Session - 01/05/24 1119     Visit Number 7    Number of Visits 20    Date for PT Re-Evaluation 03/03/24    Authorization Type [employer]IC[employer] reporting period from 12/10/2023    Authorization Time Period auth 20 visits  4/16-6/20  Authorization #[id number]    Authorization - Number of Visits 20    Progress Note Due on Visit 10    PT Start Time 1116    PT Stop Time 1200    PT Time Calculation (min) 44 min    Activity Tolerance Patient tolerated treatment well    Behavior During Therapy [facility] for tasks assessed/performed               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, small, not treating-watching , it is now stented   Blood transfusion without reported diagnosis    Cancer (HCC)    breast 08/2018   Coronary artery disease    Diverticulitis    DJD (degenerative joint disease)    DM type 2 (diabetes mellitus, type 2) (HCC)    Family history of breast cancer    Fibrocystic breast disease    GERD (gastroesophageal reflux disease)    History of chicken pox    Hypercholesteremia    Hypertension    Lichen planopilaris    Neuromuscular disorder (HCC)    Sleep apnea    cpap   Urinary incontinence    Vertigo 12/2019   1 episode   Past Surgical History:  Procedure Laterality Date   BREAST IMPLANT REMOVAL Bilateral 03/08/2019   Procedure: Removal of Bilateral Breast Implants;  Surgeon: Enid Harry, MD;  Location: [facility] OR;  Service: General;  Laterality: Bilateral;   BREAST SURGERY  02/1975   Left Breast Biopsy-Fibrocystic Disease   BREAST SURGERY  10/1981   Bialteral Capsulectomy Breast Surgery   BREAST SURGERY  07/1998   Replace Bilateral Silicone Implants   BREAST SURGERY  01/1976   Bilater breast surgery to remove fibrocystic tissue and  replace with silicone implants   CATARACT EXTRACTION W/PHACO Right 02/08/2020   Procedure: CATARACT EXTRACTION PHACO AND INTRAOCULAR LENS PLACEMENT (IOC) RIGHT DIABETIC;  Surgeon: Clair Crews, MD;  Location: [facility];  Service: Ophthalmology;  Laterality: Right;  3.55 0:33.7   CATARACT EXTRACTION W/PHACO Left 02/29/2020   Procedure: CATARACT EXTRACTION PHACO AND INTRAOCULAR LENS PLACEMENT (IOC) LEFT DIABETIC 2.93  00:27.1;  Surgeon: Clair Crews, MD;  Location: [facility];  Service: Ophthalmology;  Laterality: Left;  Diabetic - oral meds   CHOLECYSTECTOMY N/A 07/08/2016   Procedure: LAPAROSCOPIC CHOLECYSTECTOMY;  Surgeon: Gwyndolyn Lerner, MD;  Location: [facility] ORS;  Service: General;  Laterality: N/A;   COLONOSCOPY WITH PROPOFOL  N/A 11/07/2017   Procedure: COLONOSCOPY WITH PROPOFOL ;  Surgeon: Luke Salaam, MD;  Location: [facility] ENDOSCOPY;  Service: Gastroenterology;  Laterality: N/A;   DILATION AND CURETTAGE OF UTERUS  01/1994   ESOPHAGOGASTRODUODENOSCOPY (EGD) WITH PROPOFOL  N/A 12/05/2016   Procedure: ESOPHAGOGASTRODUODENOSCOPY (EGD) WITH PROPOFOL ;  Surgeon: Luke Salaam, MD;  Location: [facility] ENDOSCOPY;  Service: Endoscopy;  Laterality: N/A;   FINGER SURGERY  2001 & 2003   Trigger Finger   FOOT SURGERY  1980   To Relieve Pinched Nerve   FOOT SURGERY  07/2008  Left Foot-Plantar Fasciitis   JOINT REPLACEMENT  04/19/2013   Hip Replacement-Right   MASTECTOMY     double   MASTECTOMY W/ SENTINEL NODE BIOPSY Bilateral 03/08/2019   Procedure: BILATERAL MASTECTOMIES WITH LEFT AXILLARY SENTINEL LYMPH NODE BIOPSY AND BLUE DYE INJECTION;  Surgeon: Enid Harry, MD;  Location: [facility] OR;  Service: General;  Laterality: Bilateral;   PARTIAL KNEE ARTHROPLASTY Right 12/31/2022   Procedure: RIGHT PARTIAL KNEE ARTHROPLASTY;  Surgeon: Elner Hahn, MD;  Location: [facility] ORS;  Service: Orthopedics;  Laterality: Right;   PLACEMENT OF BREAST IMPLANTS  12/26/2004   Replace Failed  Implants   REPLACEMENT TOTAL KNEE Left    2019-   RHINOPLASTY  03/1964   To Correct Deviated Septum   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/2023   Acute diverticulitis 03/21/2023   Abdominal pain, left lower quadrant 03/16/2023   Burning with urination 03/07/2023   Overweight (BMI 25.0-29.9) 03/05/2022   Elevated ferritin 03/23/2021   Decreased GFR 02/23/2021   Vitamin B 12 deficiency 02/23/2021   Leukopenia 02/23/2021   Bronchiectasis without complication (HCC) 01/26/2020   Aortic atherosclerosis (HCC) 12/23/2019   Atherosclerosis of coronary artery of native heart without angina pectoris 12/23/2019   Peripheral edema 12/22/2019   Chemotherapy-induced peripheral neuropathy (HCC) 10/29/2019   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   Anemia 10/15/2018   Diabetic retinopathy (HCC) 09/29/2018   Family history of leukemia 09/16/2018   Family history of breast cancer    Malignant neoplasm of upper-outer quadrant of left breast in female, 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/11/2018   Family history of breast cancer in sister 08/28/2018   Diverticular disease 10/17/2017   Primary osteoarthritis of right knee 03/07/2017   HSV-1 (herpes simplex virus 1) infection, 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   Vitamin D  deficiency 10/11/2016   Family history of thyroid  disorder 04/04/2015   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   Counseling regarding end of life decision making 10/04/2014   OA (osteoarthritis) of neck 08/02/2014   History of duodenal ulcer 08/02/2014   Seasonal allergies 08/02/2014   Hypertension associated with diabetes (HCC) 08/02/2014   Hyperlipidemia associated with type 2 diabetes mellitus (HCC)  08/02/2014   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   History of joint replacement 03/01/2014   Diabetes mellitus type 2 with retinopathy (HCC) 11/07/2009    PCP: Judithann Novas, MD  REFERRING PROVIDER: Rojelio Clement, PA   REFERRING DIAG: status post right partial knee replacement, hamstring tendinosis of right thigh, strain of right hip adductor muscle  THERAPY DIAG:  Pain in right thigh  Chronic pain of right knee  Rationale for Evaluation and Treatment: Rehabilitation  ONSET DATE: September/October 2024  SUBJECTIVE:   PERTINENT HISTORY: Patient is a 78 y.o. female who presents to outpatient physical therapy with a referral for medical diagnosis status post right partial knee replacement, hamstring tendinosis of right thigh, strain of right hip adductor muscle. This patient's chief complaints consist of pain at the distal medial right thigh, leading to the following functional deficits: difficulty with sleeping, exercising, walking, going up and down stairs,  doing ADLs and IADLs, lifting an object of the floor, performing heavy activities around the home, rolling over in bed. Relevant past medical history and comorbidities include the  following: she has Diabetes mellitus type 2 with retinopathy (HCC); OA (osteoarthritis) of neck; History of duodenal ulcer; Seasonal allergies; Hypertension associated with diabetes (HCC); Hyperlipidemia associated with type 2 diabetes mellitus (HCC); Lichen plano-pilaris; Osteoporosis of forearm; History of joint replacement; Vitamin D  deficiency; HSV-1 (herpes simplex virus 1) infection, vaginal; Primary osteoarthritis of right knee; Diverticular disease; Malignant neoplasm of upper-outer quadrant of left breast in female, estrogen receptor positive (HCC); Diabetic retinopathy (HCC); Anemia; Carotid artery aneurysm (HCC); Chemotherapy-induced peripheral neuropathy (HCC); Peripheral edema; Aortic atherosclerosis (HCC); Atherosclerosis of coronary artery of  native heart without angina pectoris; Bronchiectasis without complication (HCC); Decreased GFR; Vitamin B 12 deficiency; Leukopenia; Elevated ferritin; Overweight (BMI 25.0-29.9); Burning with urination; Abdominal pain, left lower quadrant; Acute diverticulitis; Chronic cough; Reactive airways dysfunction syndrome (HCC); Acute cough; Hypercalcemia; and Pre-syncope on their problem list. she  has a past medical history of Allergy, Anemia, Aneurysm (HCC), Blood transfusion without reported diagnosis, Cancer (HCC), Coronary artery disease, Diverticulitis, DJD (degenerative joint disease), DM type 2 (diabetes mellitus, type 2) (HCC), F Fibrocystic breast disease, GERD (gastroesophageal reflux disease), History of chicken pox, Hypercholesteremia, Hypertension, Lichen planopilaris, Neuromuscular disorder (HCC), Sleep apnea, Urinary incontinence, and Vertigo (12/2019). she  has a past surgical history that includes Placement of breast implants (12/26/2004); Dilation and curettage of uterus (01/1994); Finger surgery (2001 & 2003); Breast surgery (02/1975); Rhinoplasty (03/1964); Foot surgery (1980); Breast surgery (10/1981); Breast surgery (07/1998); Foot surgery (07/2008); Breast surgery (01/1976); Cholecystectomy (N/A, 07/08/2016); Joint replacement R THA (04/19/2013);  Mastectomy w/ sentinel node biopsy (Bilateral, 03/08/2019); Breast implant removal (Bilateral, 03/08/2019); Replacement total knee (Left); Cataract extraction w/PHACO (Right, 02/08/2020); Cataract extraction w/PHACO (Left, 02/29/2020); Mastectomy; and Partial knee arthroplasty (Right, 12/31/2022). Patient denies hx of stroke, seizures, unexplained weight loss, unexplained changes in bowel or bladder problems, unexplained stumbling or dropping things, and spinal surgery. Has osteoporosis in forearm (spine steady), almost 5 years out from breast cancer, has some lung damage from chemo, and has CAD but not cardiac events .  Exercise history: Usually goes to  the gym pretty regularly with her husband. Mostly does the Nustep since her knee surgery. Also has done the treadmill. Avoids the machines if her concordant pain is actively hurting. She is getting a new recumbent bike.   SUBJECTIVE STATEMENT: Pt reports 5-6/10 NPS in R knee/tendon. In good spirits. Had a f/u with Dr. Daun Epstein and he is continuing to recommend PT for her R knee/thigh pain.   PAIN: NPRS: Current: 5/10 medial side of distal posterior thigh "not too bad" Best: 0/10, Worst: 7-8/10. Pain location: medial side of distal posterior thigh Pain description: sharp then gradually fades, lasts a few minutes.  Aggravating factors: turning over in the bed or lifting her R leg up when she is laying on her back.  Relieving factors: puts voltaren gel on it (unsure if it helps).      FUNCTIONAL LIMITATIONS: difficulty with sleeping, exercising, walking, going up and down stairs,  doing ADLs and IADLs, lifting an object of the floor, performing heavy activities around the home, rolling over in bed.  LEISURE: goes to the gym, traveling, exercising, going to the movies, pretty active for age.    PRECAUTIONS: None, used to be not allowed to lift anything over 30# due to aneurism that was repaired  WEIGHT BEARING RESTRICTIONS: No  FALLS:  Has patient fallen in last 6 months? 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   NEXT MD VISIT:   OBJECTIVE                                                                                                                              TREATMENT Therapeutic exercise: therapeutic exercises that incorporate ONE parameter at one or more areas of the body to centralize symptoms, develop strength and endurance, range of motion, and flexibility required for successful completion of functional activities.  NuStep using bilateral upper and  lower extremities. For improved extremity mobility, muscular endurance, and activity tolerance; and to induce the analgesic effect of aerobic exercise, stimulate improved joint nutrition, and prepare body structures and systems for following interventions. Also to reinforce understanding of appropriate exercise intensity to help meet physical activity guidelines for health.  Seat/handle setting: 6/8 6  minutes Level: 3 Target SPM: > 100 Average SPM: 96 RPE: 7-8/10 (Slight difficulty holding conversation by end)   There.Act: Hip hikes off edge of step with UE support on standing side. VC's for speed of exercise with fair carryover.  1x20 each side  Superset: Standing hip adduction on hip machine, height with hole 3 showing, rotary position 7, bar on level 1.  3x15 each side at 55# Moderate hard  Standing hip abduction on hip machine, height with hole 3 showing, rotary position 8, bar on level 1.  3x15 each side at 55#. Re-adjusted to 40# due to limited ROM. Hard!  Standing lateral lunge to 4 inch step with B UE support 3x15 each side AROM  Sled push  6x25 feet with sled only (75#) PT provided assistance with turning sled  Standing knee flexion with BUE support: 2x15/side with 3# AW's.    Pt required multimodal cuing for proper technique and to facilitate improved neuromuscular control, strength, range of motion, and functional ability resulting in improved performance and form.  PATIENT EDUCATION:  Education details: Exercise purpose/form. Self management techniques. Dry needling Person educated: Patient Education method: Explanation, Demonstration, and Handouts Education comprehension: verbalized understanding and needs further education  HOME EXERCISE PROGRAM: Access Code: [email or web address] URL: [email or web address] Date: 12/29/2023 Prepared by: Alleen Isle  Exercises - Supine Extended Bridge on Heels  - 3 x daily - 1 sets - 3 reps - 45 seconds hold - Supine  Hip Adduction Isometric with Ball  - 3 x daily - 1 sets - 3 reps - 45 seconds hold - Hip Adductors and Hamstring Stretch with Strap  - 2 x daily - 3 reps - 45 seconds hold - Hip Hikes off step  - 1 x daily - 3 sets - 15 reps  ASSESSMENT:  CLINICAL IMPRESSION: Continuing PT POC addressing strength and pain deficits. Pt reliant on multi modal cuing and decrease in weight with hip abduction due to weakness and hip compensations with improved carryover. Progressed hamstring exercises today and depending on DOMS response may add at next visit this week. Pt reports pain relief 4/10 NPS post  session. Patient would benefit from continued management of limiting condition by skilled physical therapist to address remaining impairments and functional limitations to work towards stated goals and return to PLOF or maximal functional independence.   From initial PT evaluation on 12/10/2023:  Patient is a 78 y.o. female referred to outpatient physical therapy with a medical diagnosis of status post right partial knee replacement, hamstring tendinosis of right thigh, strain of right hip adductor muscle who presents with signs and symptoms consistent with posterior medial R thigh pain. It does not appear to be referring from the low back based on negative slump test and reflexes. Most TTP at adductors but also tender at distal medial hamstrings. Patient presents with significant pain, activity tolerance, knowledge, decreased participation in exercise (not currently meeting physical activity recommendations for health) impairments that are limiting ability to complete sleeping, exercising, walking, going up and down stairs,  doing ADLs and IADLs, lifting an object of the floor, performing heavy activities around the home, rolling over in bed without difficulty. Patient will benefit from skilled physical therapy intervention to address current body structure impairments and activity limitations to improve function and work  towards goals set in current POC in order to return to prior level of function or maximal functional improvement.    OBJECTIVE IMPAIRMENTS: decreased activity tolerance, decreased knowledge of condition, decreased mobility, difficulty walking, and pain.   ACTIVITY LIMITATIONS: lifting, sleeping, and stairs  PARTICIPATION LIMITATIONS: community activity and  difficulty with sleeping, exercising, walking, going up and down stairs,  doing ADLs and IADLs, lifting an object of the floor, performing heavy activities around the home, rolling over in bed  PERSONAL FACTORS: Age, Fitness, Past/current experiences, Time since onset of injury/illness/exacerbation, and 3+ comorbidities:  Diabetes mellitus type 2 with retinopathy (HCC); OA (osteoarthritis) of neck; History of duodenal ulcer; Seasonal allergies; Hypertension associated with diabetes (HCC); Hyperlipidemia associated with type 2 diabetes mellitus (HCC); Lichen plano-pilaris; Osteoporosis of forearm; History of joint replacement; Vitamin D  deficiency; HSV-1 (herpes simplex virus 1) infection, vaginal; Primary osteoarthritis of right knee; Diverticular disease; Malignant neoplasm of upper-outer quadrant of left breast in female, estrogen receptor positive (HCC); Diabetic retinopathy (HCC); Anemia; Carotid artery aneurysm (HCC); Chemotherapy-induced peripheral neuropathy (HCC); Peripheral edema; Aortic atherosclerosis (HCC); Atherosclerosis of coronary artery of native heart without angina pectoris; Bronchiectasis without complication (HCC); Decreased GFR; Vitamin B 12 deficiency; Leukopenia; Elevated ferritin; Overweight (BMI 25.0-29.9); Burning with urination; Abdominal pain, left lower quadrant; Acute diverticulitis; Chronic cough; Reactive airways dysfunction syndrome (HCC); Acute cough; Hypercalcemia; and Pre-syncope on their problem list. she  has a past medical history of Allergy, Anemia, Aneurysm (HCC), Blood transfusion without reported diagnosis,  Cancer (HCC), Coronary artery disease, Diverticulitis, DJD (degenerative joint disease), DM type 2 (diabetes mellitus, type 2) (HCC), F Fibrocystic breast disease, GERD (gastroesophageal reflux disease), History of chicken pox, Hypercholesteremia, Hypertension, Lichen planopilaris, Neuromuscular disorder (HCC), Sleep apnea, Urinary incontinence, and Vertigo (12/2019). she  has a past surgical history that includes Placement of breast implants (12/26/2004); Dilation and curettage of uterus (01/1994); Finger surgery (2001 & 2003); Breast surgery (02/1975); Rhinoplasty (03/1964); Foot surgery (1980); Breast surgery (10/1981); Breast surgery (07/1998); Foot surgery (07/2008); Breast surgery (01/1976); Cholecystectomy (N/A, 07/08/2016); Joint replacement R THA (04/19/2013);  Mastectomy w/ sentinel node biopsy (Bilateral, 03/08/2019); Breast implant removal (Bilateral, 03/08/2019); Replacement total knee (Left); Cataract extraction w/PHACO (Right, 02/08/2020); Cataract extraction w/PHACO (Left, 02/29/2020); Mastectomy; and Partial knee arthroplasty (Right, 12/31/2022) are also affecting patient's functional outcome.   REHAB POTENTIAL: Good  CLINICAL DECISION  MAKING: Evolving/moderate complexity  EVALUATION COMPLEXITY: Moderate   GOALS: Goals reviewed with patient? No  SHORT TERM GOALS: Target date: 12/24/2023  Patient will be independent with initial home exercise program for self-management of symptoms. Baseline: Initial HEP to be provided at visit 2 as appropriate (12/10/23); Goal status: In progress   LONG TERM GOALS: Target date: 03/03/2024  Patient will be independent with a long-term home exercise program for self-management of symptoms.  Baseline: Initial HEP to be provided at visit 2 as appropriate (12/10/23); Goal status: In progress  2.  Patient will demonstrate improvement in Patient Specific Functional Scale (PSFS) of equal or greater than 8/10 points to reflect clinically significant  improvement in patient's most valued functional activities. Baseline: to be measured at visit 2 as appropriate (12/10/23); 4.5/10 (12/15/2023);  Goal status: In progress  3.  Patient will demonstrate the ability to go up and down 12 steps without concordant pain to improve her household and community mobility.  Baseline: pain with stairs (12/10/23); Goal status: In progress  4.  Patient will report NPRS equal or less than 3/10 during functional activities during the last 2 weeks to improve their abilitly to complete community, work and/or recreational activities with less limitation. Baseline: 7/10 (12/10/23); Goal status: In progress    PLAN:  PT FREQUENCY: 1-2x/week  PT DURATION: 8-12 weeks  PLANNED INTERVENTIONS: 97164- PT Re-evaluation, 97750- Physical Performance Testing, 97110-Therapeutic exercises, 97530- Therapeutic activity, [id number]- Neuromuscular re-education, 97535- Self Care, 40981- Manual therapy, [id number]- Electrical stimulation (unattended), Patient/Family education, Stair training, Dry Needling, Joint mobilization, Spinal mobilization, Cryotherapy, and Moist heat  PLAN FOR NEXT SESSION: update HEP as appropriate, progressive loading to R adductor and hamstring group as tolerated, further exam as needed. Education. Manual therapy as appropriate.    Marc Senior. Fairly IV, PT, DPT Physical Therapist- [facility]  01/05/24, 12:22 PM

## 2024-01-07 ENCOUNTER — Ambulatory Visit

## 2024-01-07 ENCOUNTER — Encounter: Payer: Self-pay | Admitting: Physical Therapy

## 2024-01-07 DIAGNOSIS — M25561 Pain in right knee: Secondary | ICD-10-CM | POA: Diagnosis not present

## 2024-01-07 DIAGNOSIS — G8929 Other chronic pain: Secondary | ICD-10-CM | POA: Diagnosis not present

## 2024-01-07 DIAGNOSIS — M79651 Pain in right thigh: Secondary | ICD-10-CM

## 2024-01-07 NOTE — Therapy (Signed)
 OUTPATIENT PHYSICAL THERAPY TREATMENT   Patient Name: Katie [name] MRN: [id number] DOB:Jun 21, 1946, 78 y.o., female Today's Date: 01/07/2024  END OF SESSION:  PT End of Session - 01/07/24 1259     Visit Number 8    Number of Visits 20    Date for PT Re-Evaluation 03/03/24    Authorization Type [employer] MEDICARE reporting period from 12/10/2023    Authorization Time Period auth 20 visits  4/16-6/20  Authorization #[id number]    Authorization - Number of Visits 20    Progress Note Due on Visit 10    PT Start Time 1300    PT Stop Time 1345    PT Time Calculation (min) 45 min    Activity Tolerance Patient tolerated treatment well    Behavior During Therapy [facility] for tasks assessed/performed               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, small, not treating-watching , it is now stented   Blood transfusion without reported diagnosis    Cancer (HCC)    breast 08/2018   Coronary artery disease    Diverticulitis    DJD (degenerative joint disease)    DM type 2 (diabetes mellitus, type 2) (HCC)    Family history of breast cancer    Fibrocystic breast disease    GERD (gastroesophageal reflux disease)    History of chicken pox    Hypercholesteremia    Hypertension    Lichen planopilaris    Neuromuscular disorder (HCC)    Sleep apnea    cpap   Urinary incontinence    Vertigo 12/2019   1 episode   Past Surgical History:  Procedure Laterality Date   BREAST IMPLANT REMOVAL Bilateral 03/08/2019   Procedure: Removal of Bilateral Breast Implants;  Surgeon: Enid Harry, MD;  Location: [facility] OR;  Service: General;  Laterality: Bilateral;   BREAST SURGERY  02/1975   Left Breast Biopsy-Fibrocystic Disease   BREAST SURGERY  10/1981   Bialteral Capsulectomy Breast Surgery   BREAST SURGERY  07/1998   Replace Bilateral Silicone Implants   BREAST SURGERY  01/1976   Bilater breast surgery to remove fibrocystic tissue and  replace with silicone implants   CATARACT EXTRACTION W/PHACO Right 02/08/2020   Procedure: CATARACT EXTRACTION PHACO AND INTRAOCULAR LENS PLACEMENT (IOC) RIGHT DIABETIC;  Surgeon: Clair Crews, MD;  Location: [facility];  Service: Ophthalmology;  Laterality: Right;  3.55 0:33.7   CATARACT EXTRACTION W/PHACO Left 02/29/2020   Procedure: CATARACT EXTRACTION PHACO AND INTRAOCULAR LENS PLACEMENT (IOC) LEFT DIABETIC 2.93  00:27.1;  Surgeon: Clair Crews, MD;  Location: [facility];  Service: Ophthalmology;  Laterality: Left;  Diabetic - oral meds   CHOLECYSTECTOMY N/A 07/08/2016   Procedure: LAPAROSCOPIC CHOLECYSTECTOMY;  Surgeon: Gwyndolyn Lerner, MD;  Location: [facility] ORS;  Service: General;  Laterality: N/A;   COLONOSCOPY WITH PROPOFOL  N/A 11/07/2017   Procedure: COLONOSCOPY WITH PROPOFOL ;  Surgeon: Luke Salaam, MD;  Location: [facility] ENDOSCOPY;  Service: Gastroenterology;  Laterality: N/A;   DILATION AND CURETTAGE OF UTERUS  01/1994   ESOPHAGOGASTRODUODENOSCOPY (EGD) WITH PROPOFOL  N/A 12/05/2016   Procedure: ESOPHAGOGASTRODUODENOSCOPY (EGD) WITH PROPOFOL ;  Surgeon: Luke Salaam, MD;  Location: [facility] ENDOSCOPY;  Service: Endoscopy;  Laterality: N/A;   FINGER SURGERY  2001 & 2003   Trigger Finger   FOOT SURGERY  1980   To Relieve Pinched Nerve   FOOT SURGERY  07/2008  Left Foot-Plantar Fasciitis   JOINT REPLACEMENT  04/19/2013   Hip Replacement-Right   MASTECTOMY     double   MASTECTOMY W/ SENTINEL NODE BIOPSY Bilateral 03/08/2019   Procedure: BILATERAL MASTECTOMIES WITH LEFT AXILLARY SENTINEL LYMPH NODE BIOPSY AND BLUE DYE INJECTION;  Surgeon: Enid Harry, MD;  Location: [facility] OR;  Service: General;  Laterality: Bilateral;   PARTIAL KNEE ARTHROPLASTY Right 12/31/2022   Procedure: RIGHT PARTIAL KNEE ARTHROPLASTY;  Surgeon: Elner Hahn, MD;  Location: [facility] ORS;  Service: Orthopedics;  Laterality: Right;   PLACEMENT OF BREAST IMPLANTS  12/26/2004   Replace Failed  Implants   REPLACEMENT TOTAL KNEE Left    2019-   RHINOPLASTY  03/1964   To Correct Deviated Septum   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/2023   Acute diverticulitis 03/21/2023   Abdominal pain, left lower quadrant 03/16/2023   Burning with urination 03/07/2023   Overweight (BMI 25.0-29.9) 03/05/2022   Elevated ferritin 03/23/2021   Decreased GFR 02/23/2021   Vitamin B 12 deficiency 02/23/2021   Leukopenia 02/23/2021   Bronchiectasis without complication (HCC) 01/26/2020   Aortic atherosclerosis (HCC) 12/23/2019   Atherosclerosis of coronary artery of native heart without angina pectoris 12/23/2019   Peripheral edema 12/22/2019   Chemotherapy-induced peripheral neuropathy (HCC) 10/29/2019   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   Anemia 10/15/2018   Diabetic retinopathy (HCC) 09/29/2018   Family history of leukemia 09/16/2018   Family history of breast cancer    Malignant neoplasm of upper-outer quadrant of left breast in female, 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/11/2018   Family history of breast cancer in sister 08/28/2018   Diverticular disease 10/17/2017   Primary osteoarthritis of right knee 03/07/2017   HSV-1 (herpes simplex virus 1) infection, 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   Vitamin D  deficiency 10/11/2016   Family history of thyroid  disorder 04/04/2015   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   Counseling regarding end of life decision making 10/04/2014   OA (osteoarthritis) of neck 08/02/2014   History of duodenal ulcer 08/02/2014   Seasonal allergies 08/02/2014   Hypertension associated with diabetes (HCC) 08/02/2014   Hyperlipidemia associated with type 2 diabetes mellitus (HCC)  08/02/2014   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   History of joint replacement 03/01/2014   Diabetes mellitus type 2 with retinopathy (HCC) 11/07/2009    PCP: Judithann Novas, MD  REFERRING PROVIDER: Rojelio Clement, PA   REFERRING DIAG: status post right partial knee replacement, hamstring tendinosis of right thigh, strain of right hip adductor muscle  THERAPY DIAG:  Pain in right thigh  Chronic pain of right knee  Rationale for Evaluation and Treatment: Rehabilitation  ONSET DATE: September/October 2024  SUBJECTIVE:   PERTINENT HISTORY: Patient is a 78 y.o. female who presents to outpatient physical therapy with a referral for medical diagnosis status post right partial knee replacement, hamstring tendinosis of right thigh, strain of right hip adductor muscle. This patient's chief complaints consist of pain at the distal medial right thigh, leading to the following functional deficits: difficulty with sleeping, exercising, walking, going up and down stairs,  doing ADLs and IADLs, lifting an object of the floor, performing heavy activities around the home, rolling over in bed. Relevant past medical history and comorbidities include the  following: she has Diabetes mellitus type 2 with retinopathy (HCC); OA (osteoarthritis) of neck; History of duodenal ulcer; Seasonal allergies; Hypertension associated with diabetes (HCC); Hyperlipidemia associated with type 2 diabetes mellitus (HCC); Lichen plano-pilaris; Osteoporosis of forearm; History of joint replacement; Vitamin D  deficiency; HSV-1 (herpes simplex virus 1) infection, vaginal; Primary osteoarthritis of right knee; Diverticular disease; Malignant neoplasm of upper-outer quadrant of left breast in female, estrogen receptor positive (HCC); Diabetic retinopathy (HCC); Anemia; Carotid artery aneurysm (HCC); Chemotherapy-induced peripheral neuropathy (HCC); Peripheral edema; Aortic atherosclerosis (HCC); Atherosclerosis of coronary artery of  native heart without angina pectoris; Bronchiectasis without complication (HCC); Decreased GFR; Vitamin B 12 deficiency; Leukopenia; Elevated ferritin; Overweight (BMI 25.0-29.9); Burning with urination; Abdominal pain, left lower quadrant; Acute diverticulitis; Chronic cough; Reactive airways dysfunction syndrome (HCC); Acute cough; Hypercalcemia; and Pre-syncope on their problem list. she  has a past medical history of Allergy, Anemia, Aneurysm (HCC), Blood transfusion without reported diagnosis, Cancer (HCC), Coronary artery disease, Diverticulitis, DJD (degenerative joint disease), DM type 2 (diabetes mellitus, type 2) (HCC), F Fibrocystic breast disease, GERD (gastroesophageal reflux disease), History of chicken pox, Hypercholesteremia, Hypertension, Lichen planopilaris, Neuromuscular disorder (HCC), Sleep apnea, Urinary incontinence, and Vertigo (12/2019). she  has a past surgical history that includes Placement of breast implants (12/26/2004); Dilation and curettage of uterus (01/1994); Finger surgery (2001 & 2003); Breast surgery (02/1975); Rhinoplasty (03/1964); Foot surgery (1980); Breast surgery (10/1981); Breast surgery (07/1998); Foot surgery (07/2008); Breast surgery (01/1976); Cholecystectomy (N/A, 07/08/2016); Joint replacement R THA (04/19/2013);  Mastectomy w/ sentinel node biopsy (Bilateral, 03/08/2019); Breast implant removal (Bilateral, 03/08/2019); Replacement total knee (Left); Cataract extraction w/PHACO (Right, 02/08/2020); Cataract extraction w/PHACO (Left, 02/29/2020); Mastectomy; and Partial knee arthroplasty (Right, 12/31/2022). Patient denies hx of stroke, seizures, unexplained weight loss, unexplained changes in bowel or bladder problems, unexplained stumbling or dropping things, and spinal surgery. Has osteoporosis in forearm (spine steady), almost 5 years out from breast cancer, has some lung damage from chemo, and has CAD but not cardiac events .  Exercise history: Usually goes to  the gym pretty regularly with her husband. Mostly does the Nustep since her knee surgery. Also has done the treadmill. Avoids the machines if her concordant pain is actively hurting. She is getting a new recumbent bike.   SUBJECTIVE STATEMENT: Pt reports 4-5/10 NPS in R knee/tendon. Reports fine tolerance after last session.   PAIN: NPRS: Current: 5/10 medial side of distal posterior thigh "not too bad" Best: 0/10, Worst: 7-8/10. Pain location: medial side of distal posterior thigh Pain description: sharp then gradually fades, lasts a few minutes.  Aggravating factors: turning over in the bed or lifting her R leg up when she is laying on her back.  Relieving factors: puts voltaren gel on it (unsure if it helps).      FUNCTIONAL LIMITATIONS: difficulty with sleeping, exercising, walking, going up and down stairs,  doing ADLs and IADLs, lifting an object of the floor, performing heavy activities around the home, rolling over in bed.  LEISURE: goes to the gym, traveling, exercising, going to the movies, pretty active for age.    PRECAUTIONS: None, used to be not allowed to lift anything over 30# due to aneurism that was repaired  WEIGHT BEARING RESTRICTIONS: No  FALLS:  Has patient fallen in last 6 months? 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   NEXT MD VISIT:   OBJECTIVE  TREATMENT Therapeutic exercise: therapeutic exercises that incorporate ONE parameter at one or more areas of the body to centralize symptoms, develop strength and endurance, range of motion, and flexibility required for successful completion of functional activities.  NuStep using bilateral upper and lower extremities. For improved extremity mobility, muscular  endurance, and activity tolerance; and to induce the analgesic effect of aerobic exercise, stimulate improved joint nutrition, and prepare body structures and systems for following interventions. Also to reinforce understanding of appropriate exercise intensity to help meet physical activity guidelines for health.  Seat/handle setting: 6/8 6  minutes Level: 3 Target SPM: > 100 Average SPM: 96 RPE: 7-8/10 (Slight difficulty holding conversation by end)   There.Act: Hip hikes off edge of step with UE support on standing side. VC's for speed of exercise with fair carryover.  1x20 each side  Superset: Standing hip adduction on hip machine, height with hole 3 showing, rotary position 7, bar on level 1.  3x15 each side at 40# Moderate hard  Standing hip abduction on hip machine, height with hole 3 showing, rotary position 8, bar on level 1.  3x15 Re-adjusted to 40# due to limited ROM bilat. Significant ROM improvement with reduced weight.  Hard!  Standing lateral lunge to 4 inch step with B UE support 3x15 each side AROM     Standing RDL holding 20# KB from 11.5" step. 3x8. PT demo prior. Good carryover post demo with min VC's for hip hinge.    Sled push  6x25 feet with sled only (75#) PT provided assistance with turning sled  Pt required multimodal cuing for proper technique and to facilitate improved neuromuscular control, strength, range of motion, and functional ability resulting in improved performance and form.  PATIENT EDUCATION:  Education details: Exercise purpose/form. Self management techniques. Dry needling Person educated: Patient Education method: Explanation, Demonstration, and Handouts Education comprehension: verbalized understanding and needs further education  HOME EXERCISE PROGRAM: Access Code: [email or web address] URL: [email or web address] Date: 12/29/2023 Prepared by: Alleen Isle  Exercises - Supine Extended Bridge on Heels  - 3 x daily - 1 sets - 3  reps - 45 seconds hold - Supine Hip Adduction Isometric with Ball  - 3 x daily - 1 sets - 3 reps - 45 seconds hold - Hip Adductors and Hamstring Stretch with Strap  - 2 x daily - 3 reps - 45 seconds hold - Hip Hikes off step  - 1 x daily - 3 sets - 15 reps  ASSESSMENT:  CLINICAL IMPRESSION: Continuing PT POC addressing LE strength. Additional hamstring strength performed with RDL's to trial additional stimulus to muscle group. Pt with excellent form/technique throughout session with PT demo. Encouraged pt to update primary PT on response with updated hamstring targeted therex with her symptoms. Patient would benefit from continued management of limiting condition by skilled physical therapist to address remaining impairments and functional limitations to work towards stated goals and return to PLOF or maximal functional independence.    From initial PT evaluation on 12/10/2023:  Patient is a 78 y.o. female referred to outpatient physical therapy with a medical diagnosis of status post right partial knee replacement, hamstring tendinosis of right thigh, strain of right hip adductor muscle who presents with signs and symptoms consistent with posterior medial R thigh pain. It does not appear to be referring from the low back based on negative slump test and reflexes. Most TTP at adductors but also tender at distal medial hamstrings. Patient presents with significant pain, activity tolerance, knowledge, decreased participation in  exercise (not currently meeting physical activity recommendations for health) impairments that are limiting ability to complete sleeping, exercising, walking, going up and down stairs,  doing ADLs and IADLs, lifting an object of the floor, performing heavy activities around the home, rolling over in bed without difficulty. Patient will benefit from skilled physical therapy intervention to address current body structure impairments and activity limitations to improve function and work  towards goals set in current POC in order to return to prior level of function or maximal functional improvement.    OBJECTIVE IMPAIRMENTS: decreased activity tolerance, decreased knowledge of condition, decreased mobility, difficulty walking, and pain.   ACTIVITY LIMITATIONS: lifting, sleeping, and stairs  PARTICIPATION LIMITATIONS: community activity and  difficulty with sleeping, exercising, walking, going up and down stairs,  doing ADLs and IADLs, lifting an object of the floor, performing heavy activities around the home, rolling over in bed  PERSONAL FACTORS: Age, Fitness, Past/current experiences, Time since onset of injury/illness/exacerbation, and 3+ comorbidities:  Diabetes mellitus type 2 with retinopathy (HCC); OA (osteoarthritis) of neck; History of duodenal ulcer; Seasonal allergies; Hypertension associated with diabetes (HCC); Hyperlipidemia associated with type 2 diabetes mellitus (HCC); Lichen plano-pilaris; Osteoporosis of forearm; History of joint replacement; Vitamin D  deficiency; HSV-1 (herpes simplex virus 1) infection, vaginal; Primary osteoarthritis of right knee; Diverticular disease; Malignant neoplasm of upper-outer quadrant of left breast in female, estrogen receptor positive (HCC); Diabetic retinopathy (HCC); Anemia; Carotid artery aneurysm (HCC); Chemotherapy-induced peripheral neuropathy (HCC); Peripheral edema; Aortic atherosclerosis (HCC); Atherosclerosis of coronary artery of native heart without angina pectoris; Bronchiectasis without complication (HCC); Decreased GFR; Vitamin B 12 deficiency; Leukopenia; Elevated ferritin; Overweight (BMI 25.0-29.9); Burning with urination; Abdominal pain, left lower quadrant; Acute diverticulitis; Chronic cough; Reactive airways dysfunction syndrome (HCC); Acute cough; Hypercalcemia; and Pre-syncope on their problem list. she  has a past medical history of Allergy, Anemia, Aneurysm (HCC), Blood transfusion without reported diagnosis,  Cancer (HCC), Coronary artery disease, Diverticulitis, DJD (degenerative joint disease), DM type 2 (diabetes mellitus, type 2) (HCC), F Fibrocystic breast disease, GERD (gastroesophageal reflux disease), History of chicken pox, Hypercholesteremia, Hypertension, Lichen planopilaris, Neuromuscular disorder (HCC), Sleep apnea, Urinary incontinence, and Vertigo (12/2019). she  has a past surgical history that includes Placement of breast implants (12/26/2004); Dilation and curettage of uterus (01/1994); Finger surgery (2001 & 2003); Breast surgery (02/1975); Rhinoplasty (03/1964); Foot surgery (1980); Breast surgery (10/1981); Breast surgery (07/1998); Foot surgery (07/2008); Breast surgery (01/1976); Cholecystectomy (N/A, 07/08/2016); Joint replacement R THA (04/19/2013);  Mastectomy w/ sentinel node biopsy (Bilateral, 03/08/2019); Breast implant removal (Bilateral, 03/08/2019); Replacement total knee (Left); Cataract extraction w/PHACO (Right, 02/08/2020); Cataract extraction w/PHACO (Left, 02/29/2020); Mastectomy; and Partial knee arthroplasty (Right, 12/31/2022) are also affecting patient's functional outcome.   REHAB POTENTIAL: Good  CLINICAL DECISION MAKING: Evolving/moderate complexity  EVALUATION COMPLEXITY: Moderate   GOALS: Goals reviewed with patient? No  SHORT TERM GOALS: Target date: 12/24/2023  Patient will be independent with initial home exercise program for self-management of symptoms. Baseline: Initial HEP to be provided at visit 2 as appropriate (12/10/23); Goal status: In progress   LONG TERM GOALS: Target date: 03/03/2024  Patient will be independent with a long-term home exercise program for self-management of symptoms.  Baseline: Initial HEP to be provided at visit 2 as appropriate (12/10/23); Goal status: In progress  2.  Patient will demonstrate improvement in Patient Specific Functional Scale (PSFS) of equal or greater than 8/10 points to reflect clinically significant  improvement in patient's most valued functional activities. Baseline: to be measured at  visit 2 as appropriate (12/10/23); 4.5/10 (12/15/2023);  Goal status: In progress  3.  Patient will demonstrate the ability to go up and down 12 steps without concordant pain to improve her household and community mobility.  Baseline: pain with stairs (12/10/23); Goal status: In progress  4.  Patient will report NPRS equal or less than 3/10 during functional activities during the last 2 weeks to improve their abilitly to complete community, work and/or recreational activities with less limitation. Baseline: 7/10 (12/10/23); Goal status: In progress    PLAN:  PT FREQUENCY: 1-2x/week  PT DURATION: 8-12 weeks  PLANNED INTERVENTIONS: 97164- PT Re-evaluation, 97750- Physical Performance Testing, 97110-Therapeutic exercises, 97530- Therapeutic activity, [id number]- Neuromuscular re-education, 97535- Self Care, [id number]- Manual therapy, [id number]- Electrical stimulation (unattended), Patient/Family education, Stair training, Dry Needling, Joint mobilization, Spinal mobilization, Cryotherapy, and Moist heat  PLAN FOR NEXT SESSION: update HEP as appropriate, progressive loading to R adductor and hamstring group as tolerated, further exam as needed. Education. Manual therapy as appropriate.    Marc Senior. Fairly IV, PT, DPT Physical Therapist- [place]  [facility]  01/07/24, 1:55 PM

## 2024-01-12 ENCOUNTER — Encounter: Payer: Self-pay | Admitting: Physical Therapy

## 2024-01-12 ENCOUNTER — Ambulatory Visit: Admitting: Physical Therapy

## 2024-01-12 DIAGNOSIS — G8929 Other chronic pain: Secondary | ICD-10-CM

## 2024-01-12 DIAGNOSIS — M79651 Pain in right thigh: Secondary | ICD-10-CM | POA: Diagnosis not present

## 2024-01-12 DIAGNOSIS — M25561 Pain in right knee: Secondary | ICD-10-CM | POA: Diagnosis not present

## 2024-01-12 NOTE — Therapy (Signed)
 OUTPATIENT PHYSICAL THERAPY TREATMENT   Patient Name: Katie [name] MRN: [id number] DOB:03-23-1946, 78 y.o., female Today's Date: 01/12/2024  END OF SESSION:  PT End of Session - 01/12/24 1125     Visit Number 9    Number of Visits 20    Date for PT Re-Evaluation 03/03/24    Authorization Type [employer] MEDIC[employer] reporting period from 12/10/2023    Authorization Time Period auth 20 visits  4/16-6/20  Authorization #[id number]    Authorization - Visit Number 9    Authorization - Number of Visits 20    Progress Note Due on Visit 10    PT Start Time 1122    PT Stop Time 1203    PT Time Calculation (min) 41 min    Activity Tolerance Patient tolerated treatment well    Behavior During Therapy [facility] for tasks assessed/performed                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, small, not treating-watching , it is now stented   Blood transfusion without reported diagnosis    Cancer (HCC)    breast 08/2018   Coronary artery disease    Diverticulitis    DJD (degenerative joint disease)    DM type 2 (diabetes mellitus, type 2) (HCC)    Family history of breast cancer    Fibrocystic breast disease    GERD (gastroesophageal reflux disease)    History of chicken pox    Hypercholesteremia    Hypertension    Lichen planopilaris    Neuromuscular disorder (HCC)    Sleep apnea    cpap   Urinary incontinence    Vertigo 12/2019   1 episode   Past Surgical History:  Procedure Laterality Date   BREAST IMPLANT REMOVAL Bilateral 03/08/2019   Procedure: Removal of Bilateral Breast Implants;  Surgeon: Enid Harry, MD;  Location: [facility] OR;  Service: General;  Laterality: Bilateral;   BREAST SURGERY  02/1975   Left Breast Biopsy-Fibrocystic Disease   BREAST SURGERY  10/1981   Bialteral Capsulectomy Breast Surgery   BREAST SURGERY  07/1998   Replace Bilateral Silicone Implants   BREAST SURGERY  01/1976   [place] breast  surgery to remove fibrocystic tissue and replace with silicone implants   CATARACT EXTRACTION W/PHACO Right 02/08/2020   Procedure: CATARACT EXTRACTION PHACO AND INTRAOCULAR LENS PLACEMENT (IOC) RIGHT DIABETIC;  Surgeon: Clair Crews, MD;  Location: [facility];  Service: Ophthalmology;  Laterality: Right;  3.55 0:33.7   CATARACT EXTRACTION W/PHACO Left 02/29/2020   Procedure: CATARACT EXTRACTION PHACO AND INTRAOCULAR LENS PLACEMENT (IOC) LEFT DIABETIC 2.93  00:27.1;  Surgeon: Clair Crews, MD;  Location: [facility];  Service: Ophthalmology;  Laterality: Left;  Diabetic - oral meds   CHOLECYSTECTOMY N/A 07/08/2016   Procedure: LAPAROSCOPIC CHOLECYSTECTOMY;  Surgeon: Gwyndolyn Lerner, MD;  Location: [facility] ORS;  Service: General;  Laterality: N/A;   COLONOSCOPY WITH PROPOFOL  N/A 11/07/2017   Procedure: COLONOSCOPY WITH PROPOFOL ;  Surgeon: Luke Salaam, MD;  Location: [facility] END[facility];  Service: Gastroenterology;  Laterality: N/A;   DILATION AND CURETTAGE OF UTERUS  01/1994   ESOPHAGOGASTRODUODENOSCOPY (EGD) WITH PROPOFOL  N/A 12/05/2016   Procedure: ESOPHAGOGASTRODUODENOSCOPY (EGD) WITH PROPOFOL ;  Surgeon: Luke Salaam, MD;  Location: [facility] ENDOSCOPY;  Service: Endoscopy;  Laterality: N/A;   FINGER SURGERY  2001 & 2003   Trigger Finger   FOOT SURGERY  1980   To Relieve Pinched  Nerve   FOOT SURGERY  07/2008   Left Foot-Plantar Fasciitis   JOINT REPLACEMENT  04/19/2013   Hip Replacement-Right   MASTECTOMY     double   MASTECTOMY W/ SENTINEL NODE BIOPSY Bilateral 03/08/2019   Procedure: BILATERAL MASTECTOMIES WITH LEFT AXILLARY SENTINEL LYMPH NODE BIOPSY AND BLUE DYE INJECTION;  Surgeon: Enid Harry, MD;  Location: [facility] OR;  Service: General;  Laterality: Bilateral;   PARTIAL KNEE ARTHROPLASTY Right 12/31/2022   Procedure: RIGHT PARTIAL KNEE ARTHROPLASTY;  Surgeon: Elner Hahn, MD;  Location: [facility] ORS;  Service: Orthopedics;  Laterality: Right;   PLACEMENT OF BREAST  IMPLANTS  12/26/2004   Replace Failed Implants   REPLACEMENT TOTAL KNEE Left    2019-   RHINOPLASTY  03/1964   To Correct Deviated Septum   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/2023   Acute diverticulitis 03/21/2023   Abdominal pain, left lower quadrant 03/16/2023   Burning with urination 03/07/2023   Overweight (BMI 25.0-29.9) 03/05/2022   Elevated ferritin 03/23/2021   Decreased GFR 02/23/2021   Vitamin B 12 deficiency 02/23/2021   Leukopenia 02/23/2021   Bronchiectasis without complication (HCC) 01/26/2020   Aortic atherosclerosis (HCC) 12/23/2019   Atherosclerosis of coronary artery of native heart without angina pectoris 12/23/2019   Peripheral edema 12/22/2019   Chemotherapy-induced peripheral neuropathy (HCC) 10/29/2019   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   Anemia 10/15/2018   Diabetic retinopathy (HCC) 09/29/2018   Family history of leukemia 09/16/2018   Family history of breast cancer    Malignant neoplasm of upper-outer quadrant of left breast in female, 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/11/2018   Family history of breast cancer in sister 08/28/2018   Diverticular disease 10/17/2017   Primary osteoarthritis of right knee 03/07/2017   HSV-1 (herpes simplex virus 1) infection, 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   Vitamin D  deficiency 10/11/2016   Family history of thyroid  disorder 04/04/2015   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   Counseling regarding end of life decision making 10/04/2014   OA (osteoarthritis) of neck 08/02/2014   History of duodenal ulcer 08/02/2014   Seasonal allergies 08/02/2014   Hypertension associated with diabetes (HCC) 08/02/2014   Hyperlipidemia associated  with type 2 diabetes mellitus (HCC) 08/02/2014   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   History of joint replacement 03/01/2014   Diabetes mellitus type 2 with retinopathy (HCC) 11/07/2009    PCP: Judithann Novas, MD  REFERRING PROVIDER: Rojelio Clement, PA   REFERRING DIAG: status post right partial knee replacement, hamstring tendinosis of right thigh, strain of right hip adductor muscle  THERAPY DIAG:  Pain in right thigh  Chronic pain of right knee  Rationale for Evaluation and Treatment: Rehabilitation  ONSET DATE: September/October 2024  SUBJECTIVE:   PERTINENT HISTORY: Patient is a 78 y.o. female who presents to outpatient physical therapy with a referral for medical diagnosis status post right partial knee replacement, hamstring tendinosis of right thigh, strain of right hip adductor muscle. This patient's chief complaints consist of pain at the distal medial right thigh, leading to the following functional deficits: difficulty with sleeping, exercising, walking, going up and down stairs,  doing ADLs and IADLs, lifting an object of the floor, performing heavy activities around the home, rolling over in  bed. Relevant past medical history and comorbidities include the following: she has Diabetes mellitus type 2 with retinopathy (HCC); OA (osteoarthritis) of neck; History of duodenal ulcer; Seasonal allergies; Hypertension associated with diabetes (HCC); Hyperlipidemia associated with type 2 diabetes mellitus (HCC); Lichen plano-pilaris; Osteoporosis of forearm; History of joint replacement; Vitamin D  deficiency; HSV-1 (herpes simplex virus 1) infection, vaginal; Primary osteoarthritis of right knee; Diverticular disease; Malignant neoplasm of upper-outer quadrant of left breast in female, estrogen receptor positive (HCC); Diabetic retinopathy (HCC); Anemia; Carotid artery aneurysm (HCC); Chemotherapy-induced peripheral neuropathy (HCC); Peripheral edema; Aortic atherosclerosis (HCC);  Atherosclerosis of coronary artery of native heart without angina pectoris; Bronchiectasis without complication (HCC); Decreased GFR; Vitamin B 12 deficiency; Leukopenia; Elevated ferritin; Overweight (BMI 25.0-29.9); Burning with urination; Abdominal pain, left lower quadrant; Acute diverticulitis; Chronic cough; Reactive airways dysfunction syndrome (HCC); Acute cough; Hypercalcemia; and Pre-syncope on their problem list. she  has a past medical history of Allergy, Anemia, Aneurysm (HCC), Blood transfusion without reported diagnosis, Cancer (HCC), Coronary artery disease, Diverticulitis, DJD (degenerative joint disease), DM type 2 (diabetes mellitus, type 2) (HCC), F Fibrocystic breast disease, GERD (gastroesophageal reflux disease), History of chicken pox, Hypercholesteremia, Hypertension, Lichen planopilaris, Neuromuscular disorder (HCC), Sleep apnea, Urinary incontinence, and Vertigo (12/2019). she  has a past surgical history that includes Placement of breast implants (12/26/2004); Dilation and curettage of uterus (01/1994); Finger surgery (2001 & 2003); Breast surgery (02/1975); Rhinoplasty (03/1964); Foot surgery (1980); Breast surgery (10/1981); Breast surgery (07/1998); Foot surgery (07/2008); Breast surgery (01/1976); Cholecystectomy (N/A, 07/08/2016); Joint replacement R THA (04/19/2013);  Mastectomy w/ sentinel node biopsy (Bilateral, 03/08/2019); Breast implant removal (Bilateral, 03/08/2019); Replacement total knee (Left); Cataract extraction w/PHACO (Right, 02/08/2020); Cataract extraction w/PHACO (Left, 02/29/2020); Mastectomy; and Partial knee arthroplasty (Right, 12/31/2022). Patient denies hx of stroke, seizures, unexplained weight loss, unexplained changes in bowel or bladder problems, unexplained stumbling or dropping things, and spinal surgery. Has osteoporosis in forearm (spine steady), almost 5 years out from breast cancer, has some lung damage from chemo, and has CAD but not cardiac events  .  Exercise history: Usually goes to the gym pretty regularly with her husband. Mostly does the Nustep since her knee surgery. Also has done the treadmill. Avoids the machines if her concordant pain is actively hurting. She is getting a new recumbent bike.   SUBJECTIVE STATEMENT: Patient reports a little pain at the right medial knee. It hurt more on Saturday but she is having a hard time distinguishing between pain and just soreness from doing a lot of moving furniture and things at home. She has not gone back to the gym yet, but she has used her new bike.   PAIN: NPRS: Current: 4/10 medial side of distal posterior thigh "a little pain" Best: 0/10, Worst: 7-8/10. Pain location: medial side of distal posterior thigh Pain description: sharp then gradually fades, lasts a few minutes.  Aggravating factors: turning over in the bed or lifting her R leg up when she is laying on her back.  Relieving factors: puts voltaren gel on it (unsure if it helps).      FUNCTIONAL LIMITATIONS: difficulty with sleeping, exercising, walking, going up and down stairs,  doing ADLs and IADLs, lifting an object of the floor, performing heavy activities around the home, rolling over in bed.  LEISURE: goes to the gym, traveling, exercising, going to the movies, pretty active for age.    PRECAUTIONS: None, used to be not allowed to lift anything over 30# due to aneurism that was repaired  WEIGHT BEARING RESTRICTIONS: No  FALLS:  Has patient fallen in last 6 months? 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   NEXT MD VISIT:   OBJECTIVE                                                                                                                              TREATMENT Therapeutic exercise: therapeutic exercises that incorporate ONE parameter at one or more areas of the body to  centralize symptoms, develop strength and endurance, range of motion, and flexibility required for successful completion of functional activities.  NuStep using bilateral upper and lower extremities. For improved extremity mobility, muscular endurance, and activity tolerance; and to induce the analgesic effect of aerobic exercise, stimulate improved joint nutrition, and prepare body structures and systems for following interventions. Also to reinforce understanding of appropriate exercise intensity to help meet physical activity guidelines for health.  Seat/handle setting: 6/8 5  minutes Level: 3 Target SPM: > 100 Average SPM: 79 RPE: 5/10 (Distracted by talking to PT about subjective exam)  Therapeutic activities: dynamic therapeutic activities incorporating MULTIPLE parameters or areas of the body designed to achieve improved functional performance.  Hip hikes off edge of step with UE support on standing side.  1x20 each side  Standing RDL holding 20# KB from 11.5" step. 3x10. PT demo prior. Good carryover post demo with min VC's for hip hinge and upper back stability. Felt it more in hamstrings when she did not flex knees at all.   Standing lateral lunge to 4 inch step with U UE support 2x15 each side with 10#DB in contralateral UE   Pt required multimodal cuing for proper technique and to facilitate improved neuromuscular control, strength, range of motion, and functional ability resulting in improved performance and form.  Manual therapy: to reduce pain and tissue tension, improve range of motion, neuromodulation, in order to promote improved ability to complete functional activities.  SUPINE STM to right hip adductor muscles and medial hamstrings  Trigger Point Dry Needling  Subsequent Treatment: Instructions provided previously at initial dry needling treatment.  Instructions reviewed, if requested by the patient, prior to subsequent dry needling treatment.   Patient Verbal  Consent Given: Yes Education Handout Provided: Yes Muscles Treated: 1 dry needle(s) .25mm x 60mm inserted with 4 sticks into right hip adductor group muscles with patient in supine position to decrease pain and spasms along patient's right inner thigh and posterior knee region.  Electrical Stimulation Performed: No Treatment Response/Outcome: Patient reports feeling better following procedure. She was TTP with taut bands palpated at each insertion site. No abnormal response noted.    PATIENT EDUCATION:  Education details: Exercise purpose/form. Self management techniques. Dry needling Person educated: Patient Education method: Explanation, Demonstration, and Handouts Education comprehension: verbalized understanding and needs further education  HOME EXERCISE PROGRAM: Access Code: [email or web address]  URL: [email or web address] Date: 12/29/2023 Prepared by: Alleen Isle  Exercises - Supine Extended Bridge on Heels  - 3 x daily - 1 sets - 3 reps - 45 seconds hold - Supine Hip Adduction Isometric with Ball  - 3 x daily - 1 sets - 3 reps - 45 seconds hold - Hip Adductors and Hamstring Stretch with Strap  - 2 x daily - 3 reps - 45 seconds hold - Hip Hikes off step  - 1 x daily - 3 sets - 15 reps  ASSESSMENT:  CLINICAL IMPRESSION: Patient underwent dry needling today per her request and continues to find it helpful for pain relief. Continued with hamstring specific and adductor specific exercises. Patient would benefit from continued management of limiting condition by skilled physical therapist to address remaining impairments and functional limitations to work towards stated goals and return to PLOF or maximal functional independence.   From initial PT evaluation on 12/10/2023:  Patient is a 78 y.o. female referred to outpatient physical therapy with a medical diagnosis of status post right partial knee replacement, hamstring tendinosis of right thigh, strain of right hip adductor muscle  who presents with signs and symptoms consistent with posterior medial R thigh pain. It does not appear to be referring from the low back based on negative slump test and reflexes. Most TTP at adductors but also tender at distal medial hamstrings. Patient presents with significant pain, activity tolerance, knowledge, decreased participation in exercise (not currently meeting physical activity recommendations for health) impairments that are limiting ability to complete sleeping, exercising, walking, going up and down stairs,  doing ADLs and IADLs, lifting an object of the floor, performing heavy activities around the home, rolling over in bed without difficulty. Patient will benefit from skilled physical therapy intervention to address current body structure impairments and activity limitations to improve function and work towards goals set in current POC in order to return to prior level of function or maximal functional improvement.    OBJECTIVE IMPAIRMENTS: decreased activity tolerance, decreased knowledge of condition, decreased mobility, difficulty walking, and pain.   ACTIVITY LIMITATIONS: lifting, sleeping, and stairs  PARTICIPATION LIMITATIONS: community activity and  difficulty with sleeping, exercising, walking, going up and down stairs,  doing ADLs and IADLs, lifting an object of the floor, performing heavy activities around the home, rolling over in bed  PERSONAL FACTORS: Age, Fitness, Past/current experiences, Time since onset of injury/illness/exacerbation, and 3+ comorbidities:  Diabetes mellitus type 2 with retinopathy (HCC); OA (osteoarthritis) of neck; History of duodenal ulcer; Seasonal allergies; Hypertension associated with diabetes (HCC); Hyperlipidemia associated with type 2 diabetes mellitus (HCC); Lichen plano-pilaris; Osteoporosis of forearm; History of joint replacement; Vitamin D  deficiency; HSV-1 (herpes simplex virus 1) infection, vaginal; Primary osteoarthritis of right knee;  Diverticular disease; Malignant neoplasm of upper-outer quadrant of left breast in female, estrogen receptor positive (HCC); Diabetic retinopathy (HCC); Anemia; Carotid artery aneurysm (HCC); Chemotherapy-induced peripheral neuropathy (HCC); Peripheral edema; Aortic atherosclerosis (HCC); Atherosclerosis of coronary artery of native heart without angina pectoris; Bronchiectasis without complication (HCC); Decreased GFR; Vitamin B 12 deficiency; Leukopenia; Elevated ferritin; Overweight (BMI 25.0-29.9); Burning with urination; Abdominal pain, left lower quadrant; Acute diverticulitis; Chronic cough; Reactive airways dysfunction syndrome (HCC); Acute cough; Hypercalcemia; and Pre-syncope on their problem list. she  has a past medical history of Allergy, Anemia, Aneurysm (HCC), Blood transfusion without reported diagnosis, Cancer (HCC), Coronary artery disease, Diverticulitis, DJD (degenerative joint disease), DM type 2 (diabetes mellitus, type 2) (HCC), F Fibrocystic breast disease, GERD (gastroesophageal reflux disease), History  of chicken pox, Hypercholesteremia, Hypertension, Lichen planopilaris, Neuromuscular disorder (HCC), Sleep apnea, Urinary incontinence, and Vertigo (12/2019). she  has a past surgical history that includes Placement of breast implants (12/26/2004); Dilation and curettage of uterus (01/1994); Finger surgery (2001 & 2003); Breast surgery (02/1975); Rhinoplasty (03/1964); Foot surgery (1980); Breast surgery (10/1981); Breast surgery (07/1998); Foot surgery (07/2008); Breast surgery (01/1976); Cholecystectomy (N/A, 07/08/2016); Joint replacement R THA (04/19/2013);  Mastectomy w/ sentinel node biopsy (Bilateral, 03/08/2019); Breast implant removal (Bilateral, 03/08/2019); Replacement total knee (Left); Cataract extraction w/PHACO (Right, 02/08/2020); Cataract extraction w/PHACO (Left, 02/29/2020); Mastectomy; and Partial knee arthroplasty (Right, 12/31/2022) are also affecting patient's functional  outcome.   REHAB POTENTIAL: Good  CLINICAL DECISION MAKING: Evolving/moderate complexity  EVALUATION COMPLEXITY: Moderate   GOALS: Goals reviewed with patient? No  SHORT TERM GOALS: Target date: 12/24/2023  Patient will be independent with initial home exercise program for self-management of symptoms. Baseline: Initial HEP to be provided at visit 2 as appropriate (12/10/23); Goal status: In progress   LONG TERM GOALS: Target date: 03/03/2024  Patient will be independent with a long-term home exercise program for self-management of symptoms.  Baseline: Initial HEP to be provided at visit 2 as appropriate (12/10/23); Goal status: In progress  2.  Patient will demonstrate improvement in Patient Specific Functional Scale (PSFS) of equal or greater than 8/10 points to reflect clinically significant improvement in patient's most valued functional activities. Baseline: to be measured at visit 2 as appropriate (12/10/23); 4.5/10 (12/15/2023);  Goal status: In progress  3.  Patient will demonstrate the ability to go up and down 12 steps without concordant pain to improve her household and community mobility.  Baseline: pain with stairs (12/10/23); Goal status: In progress  4.  Patient will report NPRS equal or less than 3/10 during functional activities during the last 2 weeks to improve their abilitly to complete community, work and/or recreational activities with less limitation. Baseline: 7/10 (12/10/23); Goal status: In progress    PLAN:  PT FREQUENCY: 1-2x/week  PT DURATION: 8-12 weeks  PLANNED INTERVENTIONS: 97164- PT Re-evaluation, 97750- Physical Performance Testing, 97110-Therapeutic exercises, 97530- Therapeutic activity, [id number]- Neuromuscular re-education, 97535- Self Care, 16109- Manual therapy, [id number]- Electrical stimulation (unattended), Patient/Family education, Stair training, Dry Needling, Joint mobilization, Spinal mobilization, Cryotherapy, and Moist heat  PLAN FOR  NEXT SESSION: update HEP as appropriate, progressive loading to R adductor and hamstring group as tolerated, further exam as needed. Education. Manual therapy as appropriate.    Carilyn Charles. Artemio Larry, PT, DPT 01/12/24, 12:33 PM  [facility] [address] [place], [place] [address] P: [phone] I F: [phone]

## 2024-01-14 ENCOUNTER — Ambulatory Visit

## 2024-01-14 DIAGNOSIS — G8929 Other chronic pain: Secondary | ICD-10-CM

## 2024-01-14 DIAGNOSIS — M25561 Pain in right knee: Secondary | ICD-10-CM | POA: Diagnosis not present

## 2024-01-14 DIAGNOSIS — M79651 Pain in right thigh: Secondary | ICD-10-CM

## 2024-01-14 NOTE — Therapy (Signed)
 OUTPATIENT PHYSICAL THERAPY TREATMENT Physical Therapy Progress Note Dates of reporting period  12/10/23   to   01/14/24   Patient Name: Katie [name] MRN: [id number] DOB:08-20-1946, 78 y.o., female Today's Date: 01/14/2024  END OF SESSION:  PT End of Session - 01/14/24 1119     Visit Number 10    Number of Visits 20    Date for PT Re-Evaluation 03/03/24    Authorization Type [employer]IC[employer] reporting period from 12/10/2023    Authorization Time Period auth 20 visits  4/16-6/20  Authorization #[id number]    Authorization - Visit Number 10    Authorization - Number of Visits 20    Progress Note Due on Visit 10    PT Start Time 1115    PT Stop Time 1155    PT Time Calculation (min) 40 min    Activity Tolerance Patient tolerated treatment well;No increased pain    Behavior During Therapy WFL for tasks assessed/performed              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, small, not treating-watching , it is now stented   Blood transfusion without reported diagnosis    Cancer (HCC)    breast 08/2018   Coronary artery disease    Diverticulitis    DJD (degenerative joint disease)    DM type 2 (diabetes mellitus, type 2) (HCC)    Family history of breast cancer    Fibrocystic breast disease    GERD (gastroesophageal reflux disease)    History of chicken pox    Hypercholesteremia    Hypertension    Lichen planopilaris    Neuromuscular disorder (HCC)    Sleep apnea    cpap   Urinary incontinence    Vertigo 12/2019   1 episode   Past Surgical History:  Procedure Laterality Date   BREAST IMPLANT REMOVAL Bilateral 03/08/2019   Procedure: Removal of Bilateral Breast Implants;  Surgeon: Enid Harry, MD;  Location: [facility] OR;  Service: General;  Laterality: Bilateral;   BREAST SURGERY  02/1975   Left Breast Biopsy-Fibrocystic Disease   BREAST SURGERY  10/1981   Bialteral Capsulectomy Breast Surgery   BREAST  SURGERY  07/1998   Replace Bilateral Silicone Implants   BREAST SURGERY  01/1976   Bilater breast surgery to remove fibrocystic tissue and replace with silicone implants   CATARACT EXTRACTION W/PHACO Right 02/08/2020   Procedure: CATARACT EXTRACTION PHACO AND INTRAOCULAR LENS PLACEMENT (IOC) RIGHT DIABETIC;  Surgeon: Clair Crews, MD;  Location: [facility];  Service: Ophthalmology;  Laterality: Right;  3.55 0:33.7   CATARACT EXTRACTION W/PHACO Left 02/29/2020   Procedure: CATARACT EXTRACTION PHACO AND INTRAOCULAR LENS PLACEMENT (IOC) LEFT DIABETIC 2.93  00:27.1;  Surgeon: Clair Crews, MD;  Location: [facility];  Service: Ophthalmology;  Laterality: Left;  Diabetic - oral meds   CHOLECYSTECTOMY N/A 07/08/2016   Procedure: LAPAROSCOPIC CHOLECYSTECTOMY;  Surgeon: Gwyndolyn Lerner, MD;  Location: [facility] ORS;  Service: General;  Laterality: N/A;   COLONOSCOPY WITH PROPOFOL  N/A 11/07/2017   Procedure: COLONOSCOPY WITH PROPOFOL ;  Surgeon: Luke Salaam, MD;  Location: [facility] END[facility];  Service: Gastroenterology;  Laterality: N/A;   DILATION AND CURETTAGE OF UTERUS  01/1994   ESOPHAGOGASTRODUODENOSCOPY (EGD) WITH PROPOFOL  N/A 12/05/2016   Procedure: ESOPHAGOGASTRODUODENOSCOPY (EGD) WITH PROPOFOL ;  Surgeon: Luke Salaam, MD;  Location: [facility] ENDOSCOPY;  Service: Endoscopy;  Laterality: N/A;   FINGER SURGERY  2001 &  2003   Trigger Finger   FOOT SURGERY  1980   To Relieve Pinched Nerve   FOOT SURGERY  07/2008   Left Foot-Plantar Fasciitis   JOINT REPLACEMENT  04/19/2013   Hip Replacement-Right   MASTECTOMY     double   MASTECTOMY W/ SENTINEL NODE BIOPSY Bilateral 03/08/2019   Procedure: BILATERAL MASTECTOMIES WITH LEFT AXILLARY SENTINEL LYMPH NODE BIOPSY AND BLUE DYE INJECTION;  Surgeon: Enid Harry, MD;  Location: [facility] OR;  Service: General;  Laterality: Bilateral;   PARTIAL KNEE ARTHROPLASTY Right 12/31/2022   Procedure: RIGHT PARTIAL KNEE ARTHROPLASTY;  Surgeon: Elner Hahn, MD;  Location: [facility] ORS;  Service: Orthopedics;  Laterality: Right;   PLACEMENT OF BREAST IMPLANTS  12/26/2004   Replace Failed Implants   REPLACEMENT TOTAL KNEE Left    2019-   RHINOPLASTY  03/1964   To Correct Deviated Septum   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/2023   Acute diverticulitis 03/21/2023   Abdominal pain, left lower quadrant 03/16/2023   Burning with urination 03/07/2023   Overweight (BMI 25.0-29.9) 03/05/2022   Elevated ferritin 03/23/2021   Decreased GFR 02/23/2021   Vitamin B 12 deficiency 02/23/2021   Leukopenia 02/23/2021   Bronchiectasis without complication (HCC) 01/26/2020   Aortic atherosclerosis (HCC) 12/23/2019   Atherosclerosis of coronary artery of native heart without angina pectoris 12/23/2019   Peripheral edema 12/22/2019   Chemotherapy-induced peripheral neuropathy (HCC) 10/29/2019   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   Anemia 10/15/2018   Diabetic retinopathy (HCC) 09/29/2018   Family history of leukemia 09/16/2018   Family history of breast cancer    Malignant neoplasm of upper-outer quadrant of left breast in female, 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/11/2018   Family history of breast cancer in sister 08/28/2018   Diverticular disease 10/17/2017   Primary osteoarthritis of right knee 03/07/2017   HSV-1 (herpes simplex virus 1) infection, 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   Vitamin D  deficiency 10/11/2016   Family history of thyroid  disorder 04/04/2015   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   Counseling regarding end of life decision making 10/04/2014   OA (osteoarthritis) of neck 08/02/2014   History of duodenal ulcer 08/02/2014   Seasonal allergies  08/02/2014   Hypertension associated with diabetes (HCC) 08/02/2014   Hyperlipidemia associated with type 2 diabetes mellitus (HCC) 08/02/2014   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   History of joint replacement 03/01/2014   Diabetes mellitus type 2 with retinopathy (HCC) 11/07/2009   PCP: Judithann Novas, MD REFERRING PROVIDER: Rojelio Clement, PA  REFERRING DIAG: status post right partial knee replacement, hamstring tendinosis of right thigh, strain of right hip adductor muscle  THERAPY DIAG:  Pain in right thigh  Chronic pain of right knee  Rationale for Evaluation and Treatment: Rehabilitation  ONSET DATE: September/October 2024  SUBJECTIVE:   PERTINENT HISTORY: Patient is a 78 y.o. female who presents to outpatient physical therapy with a referral for medical diagnosis status post right partial knee replacement, hamstring tendinosis of right thigh, strain of right hip adductor muscle. This patient's chief complaints consist of pain at the distal medial right thigh, leading to the following functional deficits: difficulty with sleeping, exercising, walking, going up and down stairs,  doing ADLs and IADLs, lifting an  object of the floor, performing heavy activities around the home, rolling over in bed. Relevant past medical history and comorbidities include the following: she has Diabetes mellitus type 2 with retinopathy (HCC); OA (osteoarthritis) of neck; History of duodenal ulcer; Seasonal allergies; Hypertension associated with diabetes (HCC); Hyperlipidemia associated with type 2 diabetes mellitus (HCC); Lichen plano-pilaris; Osteoporosis of forearm; History of joint replacement; Vitamin D  deficiency; HSV-1 (herpes simplex virus 1) infection, vaginal; Primary osteoarthritis of right knee; Diverticular disease; Malignant neoplasm of upper-outer quadrant of left breast in female, estrogen receptor positive (HCC); Diabetic retinopathy (HCC); Anemia; Carotid artery aneurysm (HCC);  Chemotherapy-induced peripheral neuropathy (HCC); Peripheral edema; Aortic atherosclerosis (HCC); Atherosclerosis of coronary artery of native heart without angina pectoris; Bronchiectasis without complication (HCC); Decreased GFR; Vitamin B 12 deficiency; Leukopenia; Elevated ferritin; Overweight (BMI 25.0-29.9); Burning with urination; Abdominal pain, left lower quadrant; Acute diverticulitis; Chronic cough; Reactive airways dysfunction syndrome (HCC); Acute cough; Hypercalcemia; and Pre-syncope on their problem list. she  has a past medical history of Allergy, Anemia, Aneurysm (HCC), Blood transfusion without reported diagnosis, Cancer (HCC), Coronary artery disease, Diverticulitis, DJD (degenerative joint disease), DM type 2 (diabetes mellitus, type 2) (HCC), F Fibrocystic breast disease, GERD (gastroesophageal reflux disease), History of chicken pox, Hypercholesteremia, Hypertension, Lichen planopilaris, Neuromuscular disorder (HCC), Sleep apnea, Urinary incontinence, and Vertigo (12/2019). she  has a past surgical history that includes Placement of breast implants (12/26/2004); Dilation and curettage of uterus (01/1994); Finger surgery (2001 & 2003); Breast surgery (02/1975); Rhinoplasty (03/1964); Foot surgery (1980); Breast surgery (10/1981); Breast surgery (07/1998); Foot surgery (07/2008); Breast surgery (01/1976); Cholecystectomy (N/A, 07/08/2016); Joint replacement R THA (04/19/2013);  Mastectomy w/ sentinel node biopsy (Bilateral, 03/08/2019); Breast implant removal (Bilateral, 03/08/2019); Replacement total knee (Left); Cataract extraction w/PHACO (Right, 02/08/2020); Cataract extraction w/PHACO (Left, 02/29/2020); Mastectomy; and Partial knee arthroplasty (Right, 12/31/2022). Patient denies hx of stroke, seizures, unexplained weight loss, unexplained changes in bowel or bladder problems, unexplained stumbling or dropping things, and spinal surgery. Has osteoporosis in forearm (spine steady), almost 5  years out from breast cancer, has some lung damage from chemo, and has CAD but not cardiac events .  Exercise history: Usually goes to the gym pretty regularly with her husband. Mostly does the Nustep since her knee surgery. Also has done the treadmill. Avoids the machines if her concordant pain is actively hurting. She is getting a new recumbent bike.   SUBJECTIVE STATEMENT: Pt reports a little extra soreness due to excess duties at home pertaining to recent kitchen remodel. Pt says dry needling last session really helped fix her pain a lot.    PAIN: Rt knee 3/10 medial hamstrings.   FUNCTIONAL LIMITATIONS: difficulty with sleeping, exercising, walking, going up and down stairs,  doing ADLs and IADLs, lifting an object of the floor, performing heavy activities around the home, rolling over in bed.  LEISURE: goes to the gym, traveling, exercising, going to the movies, pretty active for age.    PRECAUTIONS: None, used to be not allowed to lift anything over 30# due to aneurism that was repaired  WEIGHT BEARING RESTRICTIONS: No  FALLS:  Has patient fallen in last 6 months? 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   NEXT MD VISIT:   OBJECTIVE  TREATMENT -Nustepping x 6 minutes, level 3, seat 6, arms 8 -hip hike 1x20 bilat, BUE support for balance   -10LB KB RDL (12" step depth) 1x10 -hip hike 1x20 bilat, BUE support for balance   -10LB KB RDL (12" step depth) 1x10  -standing HS curl at bar, 3lb AW 1x15bilat   -lateral lunge to 4 inch step, 10lb KB in 2 hands, minGUard assist 2x10 bilat  -standing HS curl at bar, 3lb AW 1x15bilat   -lateral lunge to 4 inch step, 10lb KB in 2 hands, minGUard assist 2x10 bilat  -hamstring propulsion  seated on rollystool 2x74ft   PATIENT EDUCATION:  Education details: Exercise purpose/form. Self management techniques. Dry needling Person educated: Patient Education method: Explanation, Demonstration, and Handouts Education comprehension: verbalized understanding and needs further education  HOME EXERCISE PROGRAM: Access Code: [email or web address] URL: [email or web address] Date: 12/29/2023 Prepared by: Alleen Isle  Exercises - Supine Extended Bridge on Heels  - 3 x daily - 1 sets - 3 reps - 45 seconds hold - Supine Hip Adduction Isometric with Ball  - 3 x daily - 1 sets - 3 reps - 45 seconds hold - Hip Adductors and Hamstring Stretch with Strap  - 2 x daily - 3 reps - 45 seconds hold - Hip Hikes off step  - 1 x daily - 3 sets - 15 reps  ASSESSMENT:  CLINICAL IMPRESSION: Pt tolerates session well, 1 rest break needed, no pain exacerbation, minGuardA provided for 1 exercise, and no cues needed for more advanced exercises. Pt is making progress overall thus far, however has recently had a setback due to high household activity needs. Patient would benefit from continued management of limiting condition by skilled physical therapist to address remaining impairments and functional limitations to work towards stated goals and return to PLOF or maximal functional independence.    OBJECTIVE IMPAIRMENTS: decreased activity tolerance, decreased knowledge of condition, decreased mobility, difficulty walking, and pain.   ACTIVITY LIMITATIONS: lifting, sleeping, and stairs  PARTICIPATION LIMITATIONS: community activity and  difficulty with sleeping, exercising, walking, going up and down stairs,  doing ADLs and IADLs, lifting an object of the floor, performing heavy activities around the home, rolling over in bed  PERSONAL FACTORS: Age, Fitness, Past/current experiences, Time since onset of injury/illness/exacerbation, and 3+ comorbidities:  Diabetes mellitus type 2 with retinopathy (HCC);  OA (osteoarthritis) of neck; History of duodenal ulcer; Seasonal allergies; Hypertension associated with diabetes (HCC); Hyperlipidemia associated with type 2 diabetes mellitus (HCC); Lichen plano-pilaris; Osteoporosis of forearm; History of joint replacement; Vitamin D  deficiency; HSV-1 (herpes simplex virus 1) infection, vaginal; Primary osteoarthritis of right knee; Diverticular disease; Malignant neoplasm of upper-outer quadrant of left breast in female, estrogen receptor positive (HCC); Diabetic retinopathy (HCC); Anemia; Carotid artery aneurysm (HCC); Chemotherapy-induced peripheral neuropathy (HCC); Peripheral edema; Aortic atherosclerosis (HCC); Atherosclerosis of coronary artery of native heart without angina pectoris; Bronchiectasis without complication (HCC); Decreased GFR; Vitamin B 12 deficiency; Leukopenia; Elevated ferritin; Overweight (BMI 25.0-29.9); Burning with urination; Abdominal pain, left lower quadrant; Acute diverticulitis; Chronic cough; Reactive airways dysfunction syndrome (HCC); Acute cough; Hypercalcemia; and Pre-syncope on their problem list. she  has a past medical history of Allergy, Anemia, Aneurysm (HCC), Blood transfusion without reported diagnosis, Cancer (HCC), Coronary artery disease, Diverticulitis, DJD (degenerative joint disease), DM type 2 (diabetes mellitus, type 2) (HCC), F Fibrocystic breast disease, GERD (gastroesophageal reflux disease), History of chicken pox, Hypercholesteremia, Hypertension, Lichen planopilaris, Neuromuscular disorder (HCC), Sleep apnea, Urinary incontinence, and Vertigo (12/2019). she  has a past surgical  history that includes Placement of breast implants (12/26/2004); Dilation and curettage of uterus (01/1994); Finger surgery (2001 & 2003); Breast surgery (02/1975); Rhinoplasty (03/1964); Foot surgery (1980); Breast surgery (10/1981); Breast surgery (07/1998); Foot surgery (07/2008); Breast surgery (01/1976); Cholecystectomy (N/A, 07/08/2016);  Joint replacement R THA (04/19/2013);  Mastectomy w/ sentinel node biopsy (Bilateral, 03/08/2019); Breast implant removal (Bilateral, 03/08/2019); Replacement total knee (Left); Cataract extraction w/PHACO (Right, 02/08/2020); Cataract extraction w/PHACO (Left, 02/29/2020); Mastectomy; and Partial knee arthroplasty (Right, 12/31/2022) are also affecting patient's functional outcome.   REHAB POTENTIAL: Good CLINICAL DECISION MAKING: Evolving/moderate complexity EVALUATION COMPLEXITY: Moderate  GOALS: Goals reviewed with patient? No SHORT TERM GOALS: Target date: 12/24/2023  Patient will be independent with initial home exercise program for self-management of symptoms. Baseline: Initial HEP to be provided at visit 2 as appropriate (12/10/23); Goal status: In progress  LONG TERM GOALS: Target date: 03/03/2024  Patient will be independent with a long-term home exercise program for self-management of symptoms.  Baseline: Initial HEP to be provided at visit 2 as appropriate (12/10/23); Goal status: In progress  2.  Patient will demonstrate improvement in Patient Specific Functional Scale (PSFS) of equal or greater than 8/10 points to reflect clinically significant improvement in patient's most valued functional activities. Baseline: to be measured at visit 2 as appropriate (12/10/23); 4.5/10 (12/15/2023);  Goal status: In progress  3.  Patient will demonstrate the ability to go up and down 12 steps without concordant pain to improve her household and community mobility.  Baseline: pain with stairs (12/10/23); Goal status: In progress  4.  Patient will report NPRS equal or less than 3/10 during functional activities during the last 2 weeks to improve their abilitly to complete community, work and/or recreational activities with less limitation. Baseline: 7/10 (12/10/23); Goal status: In progress  PLAN:  PT FREQUENCY: 1-2x/week  PT DURATION: 8-12 weeks  PLANNED INTERVENTIONS: [id number]- PT  Re-evaluation, 97750- Physical Performance Testing, 97110-Therapeutic exercises, 97530- Therapeutic activity, [id number]- Neuromuscular re-education, 97535- Self Care, 16109- Manual therapy, [id number]- Electrical stimulation (unattended), Patient/Family education, Stair training, Dry Needling, Joint mobilization, Spinal mobilization, Cryotherapy, and Moist heat  PLAN FOR NEXT SESSION: update HEP as appropriate, progressive loading to Rt adductor and hamstring group as tolerated, further exam as needed. Education. Manual therapy as appropriate.   11:23 AM, 01/14/24 Dawn Eth, PT, DPT Physical Therapist - [facility]  [phone] [facility])

## 2024-01-20 ENCOUNTER — Encounter: Payer: Self-pay | Admitting: Physical Therapy

## 2024-01-20 ENCOUNTER — Ambulatory Visit: Admitting: Physical Therapy

## 2024-01-20 DIAGNOSIS — G8929 Other chronic pain: Secondary | ICD-10-CM | POA: Diagnosis not present

## 2024-01-20 DIAGNOSIS — M25561 Pain in right knee: Secondary | ICD-10-CM | POA: Diagnosis not present

## 2024-01-20 DIAGNOSIS — M79651 Pain in right thigh: Secondary | ICD-10-CM | POA: Diagnosis not present

## 2024-01-20 NOTE — Therapy (Signed)
 OUTPATIENT PHYSICAL THERAPY TREATMENT    Patient Name: Katie [name] MRN: [id number] DOB:July 03, 1946, 78 y.o., female Today's Date: 01/20/2024  END OF SESSION:  PT End of Session - 01/20/24 0914     Visit Number 11    Number of Visits 20    Date for PT Re-Evaluation 03/03/24    Authorization Type [employer]IC[employer] reporting period from 12/10/2023    Authorization Time Period auth 20 visits  4/16-6/20  Authorization #[id number]    Authorization - Visit Number 11    Authorization - Number of Visits 20    Progress Note Due on Visit 10    PT Start Time 0906    PT Stop Time 0957    PT Time Calculation (min) 51 min    Activity Tolerance Patient tolerated treatment well;No increased pain    Behavior During Therapy WFL for tasks assessed/performed               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, small, not treating-watching , it is now stented   Blood transfusion without reported diagnosis    Cancer (HCC)    breast 08/2018   Coronary artery disease    Diverticulitis    DJD (degenerative joint disease)    DM type 2 (diabetes mellitus, type 2) (HCC)    Family history of breast cancer    Fibrocystic breast disease    GERD (gastroesophageal reflux disease)    History of chicken pox    Hypercholesteremia    Hypertension    Lichen planopilaris    Neuromuscular disorder (HCC)    Sleep apnea    cpap   Urinary incontinence    Vertigo 12/2019   1 episode   Past Surgical History:  Procedure Laterality Date   BREAST IMPLANT REMOVAL Bilateral 03/08/2019   Procedure: Removal of Bilateral Breast Implants;  Surgeon: Enid Harry, MD;  Location: [facility] OR;  Service: General;  Laterality: Bilateral;   BREAST SURGERY  02/1975   Left Breast Biopsy-Fibrocystic Disease   BREAST SURGERY  10/1981   Bialteral Capsulectomy Breast Surgery   BREAST SURGERY  07/1998   Replace Bilateral Silicone Implants   BREAST SURGERY  01/1976    Bilater breast surgery to remove fibrocystic tissue and replace with silicone implants   CATARACT EXTRACTION W/PHACO Right 02/08/2020   Procedure: CATARACT EXTRACTION PHACO AND INTRAOCULAR LENS PLACEMENT (IOC) RIGHT DIABETIC;  Surgeon: Clair Crews, MD;  Location: [facility];  Service: Ophthalmology;  Laterality: Right;  3.55 0:33.7   CATARACT EXTRACTION W/PHACO Left 02/29/2020   Procedure: CATARACT EXTRACTION PHACO AND INTRAOCULAR LENS PLACEMENT (IOC) LEFT DIABETIC 2.93  00:27.1;  Surgeon: Clair Crews, MD;  Location: [facility];  Service: Ophthalmology;  Laterality: Left;  Diabetic - oral meds   CHOLECYSTECTOMY N/A 07/08/2016   Procedure: LAPAROSCOPIC CHOLECYSTECTOMY;  Surgeon: Gwyndolyn Lerner, MD;  Location: [facility] ORS;  Service: General;  Laterality: N/A;   COLONOSCOPY WITH PROPOFOL  N/A 11/07/2017   Procedure: COLONOSCOPY WITH PROPOFOL ;  Surgeon: Luke Salaam, MD;  Location: [facility];  Service: Gastroenterology;  Laterality: N/A;   DILATION AND CURETTAGE OF UTERUS  01/1994   ESOPHAGOGASTRODUODENOSCOPY (EGD) WITH PROPOFOL  N/A 12/05/2016   Procedure: ESOPHAGOGASTRODUODENOSCOPY (EGD) WITH PROPOFOL ;  Surgeon: Luke Salaam, MD;  Location: [facility] ENDOSCOPY;  Service: Endoscopy;  Laterality: N/A;   FINGER SURGERY  2001 & 2003   Trigger Finger   FOOT SURGERY  1980  To Relieve Pinched Nerve   FOOT SURGERY  07/2008   Left Foot-Plantar Fasciitis   JOINT REPLACEMENT  04/19/2013   Hip Replacement-Right   MASTECTOMY     double   MASTECTOMY W/ SENTINEL NODE BIOPSY Bilateral 03/08/2019   Procedure: BILATERAL MASTECTOMIES WITH LEFT AXILLARY SENTINEL LYMPH NODE BIOPSY AND BLUE DYE INJECTION;  Surgeon: Enid Harry, MD;  Location: [facility] OR;  Service: General;  Laterality: Bilateral;   PARTIAL KNEE ARTHROPLASTY Right 12/31/2022   Procedure: RIGHT PARTIAL KNEE ARTHROPLASTY;  Surgeon: Elner Hahn, MD;  Location: [facility] ORS;  Service: Orthopedics;  Laterality: Right;    PLACEMENT OF BREAST IMPLANTS  12/26/2004   Replace Failed Implants   REPLACEMENT TOTAL KNEE Left    2019-   RHINOPLASTY  03/1964   To Correct Deviated Septum   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/2023   Acute diverticulitis 03/21/2023   Abdominal pain, left lower quadrant 03/16/2023   Burning with urination 03/07/2023   Overweight (BMI 25.0-29.9) 03/05/2022   Elevated ferritin 03/23/2021   Decreased GFR 02/23/2021   Vitamin B 12 deficiency 02/23/2021   Leukopenia 02/23/2021   Bronchiectasis without complication (HCC) 01/26/2020   Aortic atherosclerosis (HCC) 12/23/2019   Atherosclerosis of coronary artery of native heart without angina pectoris 12/23/2019   Peripheral edema 12/22/2019   Chemotherapy-induced peripheral neuropathy (HCC) 10/29/2019   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   Anemia 10/15/2018   Diabetic retinopathy (HCC) 09/29/2018   Family history of leukemia 09/16/2018   Family history of breast cancer    Malignant neoplasm of upper-outer quadrant of left breast in female, 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/11/2018   Family history of breast cancer in sister 08/28/2018   Diverticular disease 10/17/2017   Primary osteoarthritis of right knee 03/07/2017   HSV-1 (herpes simplex virus 1) infection, 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   Vitamin D  deficiency 10/11/2016   Family history of thyroid  disorder 04/04/2015   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   Counseling regarding end of life decision making 10/04/2014   OA (osteoarthritis) of neck 08/02/2014   History of duodenal ulcer 08/02/2014   Seasonal allergies 08/02/2014   Hypertension associated with diabetes (HCC) 08/02/2014    Hyperlipidemia associated with type 2 diabetes mellitus (HCC) 08/02/2014   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   History of joint replacement 03/01/2014   Diabetes mellitus type 2 with retinopathy (HCC) 11/07/2009   PCP: Judithann Novas, MD REFERRING PROVIDER: Rojelio Clement, PA  REFERRING DIAG: status post right partial knee replacement, hamstring tendinosis of right thigh, strain of right hip adductor muscle  THERAPY DIAG:  Pain in right thigh  Chronic pain of right knee  Rationale for Evaluation and Treatment: Rehabilitation  ONSET DATE: September/October 2024  SUBJECTIVE:   PERTINENT HISTORY: Patient is a 78 y.o. female who presents to outpatient physical therapy with a referral for medical diagnosis status post right partial knee replacement, hamstring tendinosis of right thigh, strain of right hip adductor muscle. This patient's chief complaints consist of pain at the distal medial right thigh, leading to the following functional deficits: difficulty with sleeping, exercising, walking, going up and down stairs,  doing ADLs and IADLs, lifting an object of the floor, performing heavy activities around the home, rolling over in  bed. Relevant past medical history and comorbidities include the following: she has Diabetes mellitus type 2 with retinopathy (HCC); OA (osteoarthritis) of neck; History of duodenal ulcer; Seasonal allergies; Hypertension associated with diabetes (HCC); Hyperlipidemia associated with type 2 diabetes mellitus (HCC); Lichen plano-pilaris; Osteoporosis of forearm; History of joint replacement; Vitamin D  deficiency; HSV-1 (herpes simplex virus 1) infection, vaginal; Primary osteoarthritis of right knee; Diverticular disease; Malignant neoplasm of upper-outer quadrant of left breast in female, estrogen receptor positive (HCC); Diabetic retinopathy (HCC); Anemia; Carotid artery aneurysm (HCC); Chemotherapy-induced peripheral neuropathy (HCC); Peripheral edema; Aortic  atherosclerosis (HCC); Atherosclerosis of coronary artery of native heart without angina pectoris; Bronchiectasis without complication (HCC); Decreased GFR; Vitamin B 12 deficiency; Leukopenia; Elevated ferritin; Overweight (BMI 25.0-29.9); Burning with urination; Abdominal pain, left lower quadrant; Acute diverticulitis; Chronic cough; Reactive airways dysfunction syndrome (HCC); Acute cough; Hypercalcemia; and Pre-syncope on their problem list. she  has a past medical history of Allergy, Anemia, Aneurysm (HCC), Blood transfusion without reported diagnosis, Cancer (HCC), Coronary artery disease, Diverticulitis, DJD (degenerative joint disease), DM type 2 (diabetes mellitus, type 2) (HCC), F Fibrocystic breast disease, GERD (gastroesophageal reflux disease), History of chicken pox, Hypercholesteremia, Hypertension, Lichen planopilaris, Neuromuscular disorder (HCC), Sleep apnea, Urinary incontinence, and Vertigo (12/2019). she  has a past surgical history that includes Placement of breast implants (12/26/2004); Dilation and curettage of uterus (01/1994); Finger surgery (2001 & 2003); Breast surgery (02/1975); Rhinoplasty (03/1964); Foot surgery (1980); Breast surgery (10/1981); Breast surgery (07/1998); Foot surgery (07/2008); Breast surgery (01/1976); Cholecystectomy (N/A, 07/08/2016); Joint replacement R THA (04/19/2013);  Mastectomy w/ sentinel node biopsy (Bilateral, 03/08/2019); Breast implant removal (Bilateral, 03/08/2019); Replacement total knee (Left); Cataract extraction w/PHACO (Right, 02/08/2020); Cataract extraction w/PHACO (Left, 02/29/2020); Mastectomy; and Partial knee arthroplasty (Right, 12/31/2022). Patient denies hx of stroke, seizures, unexplained weight loss, unexplained changes in bowel or bladder problems, unexplained stumbling or dropping things, and spinal surgery. Has osteoporosis in forearm (spine steady), almost 5 years out from breast cancer, has some lung damage from chemo, and has CAD  but not cardiac events .  Exercise history: Usually goes to the gym pretty regularly with her husband. Mostly does the Nustep since her knee surgery. Also has done the treadmill. Avoids the machines if her concordant pain is actively hurting. She is getting a new recumbent bike.   SUBJECTIVE STATEMENT: Patient reports her R leg felt pretty good this weekend. She continues to feel like dry needling is helping.   PAIN: NPRS: Current: 2/10 medial side of distal posterior thigh Best: 0/10, Worst: 7-8/10. Pain location: medial side of distal posterior thigh Pain description: sharp then gradually fades, lasts a few minutes.  Aggravating factors: turning over in the bed or lifting her R leg up when she is laying on her back.  Relieving factors: puts voltaren gel on it (unsure if it helps).       FUNCTIONAL LIMITATIONS: difficulty with sleeping, exercising, walking, going up and down stairs,  doing ADLs and IADLs, lifting an object of the floor, performing heavy activities around the home, rolling over in bed.  LEISURE: goes to the gym usually 2-3 times a week, traveling, exercising, going to the movies, pretty active for age. She has a set of 2# and 3# DB at home, recumbent bike.    PRECAUTIONS: None, used to be not allowed to lift anything over 30# due to aneurism that was repaired  WEIGHT BEARING RESTRICTIONS: No  FALLS:  Has patient fallen in last 6 months? 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   NEXT MD VISIT:   OBJECTIVE                            SELF-REPORTED FUNCTION Patient Specific Functional Scale (PSFS)  Standing up from a sitting position: 9 Rolling over in bed: 10 Stairs: 10 Walking on the treadmill: 10 Average: 9.75/10  FUNCTIONAL TESTS Stairs: ascended/descended 12 steps with B UE support step over steps with B UE support as she does  at home. Felt fine. Repeated without UE support and had one moment of imbalance at the bottom of the last step where she hit her shoe and caught her balance by leaning on the railing. Concordant pain fine.                                                                                                     TREATMENT Therapeutic exercise: therapeutic exercises that incorporate ONE parameter at one or more areas of the body to centralize symptoms, develop strength and endurance, range of motion, and flexibility required for successful completion of functional activities.   NuStep using bilateral upper and lower extremities. For improved extremity mobility, muscular endurance, and activity tolerance; and to induce the analgesic effect of aerobic exercise, stimulate improved joint nutrition, and prepare body structures and systems for following interventions. Also to reinforce understanding of appropriate exercise intensity to help meet physical activity guidelines for health.  Seat/handle setting: 6/8 7  minutes Level: 3 Target SPM: > 100 Average SPM: 84 RPE: 5/10 (Distracted by talking to PT about subjective exam, goals, and POC)  Therapeutic activities: dynamic therapeutic activities incorporating MULTIPLE parameters or areas of the body designed to achieve improved functional performance.  Functional testing to assess progress (see above)  Hip hikes off edge of step with UE support on standing side.  1x20 each side  Standing lateral lunge to 4 inch step with U UE support 3x15 each side with 10#DB in contralateral UE  Added to HEP  Standing RDL holding end of 15# DB from floor/yoga block to max ROM with braced back 3x10 Cuing for flat back and scapular retraction with lat activation Education on how to perform at gym without KB or yoga block Added to HEP  Pt required multimodal cuing for proper technique and to facilitate improved neuromuscular control, strength, range of motion, and  functional ability resulting in improved performance and form.  Trigger Point Dry Needling   Subsequent Treatment: Instructions provided previously at initial dry needling treatment.  Instructions reviewed, if requested by the patient, prior to subsequent dry needling treatment.    Patient Verbal Consent Given: Yes Education Handout Provided: Yes Muscles Treated: 1 dry needle(s) .25mm x 60mm inserted with 3 sticks into right hip adductor group muscles with patient in supine position to decrease pain and spasms along patient's right inner thigh and posterior knee region.   Electrical Stimulation Performed: No Treatment Response/Outcome: Less tension to palpation. Patient  feels needle minimally but continues to report dry needling helps.   PATIENT EDUCATION:  Education details: Exercise purpose/form. Self management techniques. Dry needling. HEP with handout.  Person educated: Patient Education method: Explanation, Demonstration, and Handouts Education comprehension: verbalized understanding and needs further education  HOME EXERCISE PROGRAM: Access Code: [email or web address] URL: [email or web address] Date: 01/20/2024 Prepared by: Alleen Isle  Exercises - Supine Extended Bridge on Heels  - 1 x daily - 1 sets - 3 reps - 45 seconds hold - Hip Adductors and Hamstring Stretch with Strap  - 1 x daily - 3 reps - 45 seconds hold - Hip Hikes off step  - 1 x daily - 3 sets - 15 reps - Side Lunge with Counter Support  - 2-3 x daily - 3 sets - 15 reps - Kettlebell Deadlift  - 2-3 x weekly - 3 sets - 10 reps  ASSESSMENT:  CLINICAL IMPRESSION: Patient has met her stair and PSFS goals and showing good improvement in physical therapy. She participates in HEP regularly and it was updated today to be closer to a long term HEP in preparation for discharge. She continues to have pain up to 7-8/10 which is not an improvement in intensity but she reports an improvement in frequency. She continues to  report benefits from dry needling, which was repeated today and tolerated well. She would benefit from continued PT to further address pain complaints which are not yet at goal and to reinforce participation and readiness for self-management with long term HEP without regular dry needling. Patient would benefit from continued management of limiting condition by skilled physical therapist to address remaining impairments and functional limitations to work towards stated goals and return to PLOF or maximal functional independence.    OBJECTIVE IMPAIRMENTS: decreased activity tolerance, decreased knowledge of condition, decreased mobility, difficulty walking, and pain.   ACTIVITY LIMITATIONS: lifting, sleeping, and stairs  PARTICIPATION LIMITATIONS: community activity and  difficulty with sleeping, exercising, walking, going up and down stairs,  doing ADLs and IADLs, lifting an object of the floor, performing heavy activities around the home, rolling over in bed  PERSONAL FACTORS: Age, Fitness, Past/current experiences, Time since onset of injury/illness/exacerbation, and 3+ comorbidities:  Diabetes mellitus type 2 with retinopathy (HCC); OA (osteoarthritis) of neck; History of duodenal ulcer; Seasonal allergies; Hypertension associated with diabetes (HCC); Hyperlipidemia associated with type 2 diabetes mellitus (HCC); Lichen plano-pilaris; Osteoporosis of forearm; History of joint replacement; Vitamin D  deficiency; HSV-1 (herpes simplex virus 1) infection, vaginal; Primary osteoarthritis of right knee; Diverticular disease; Malignant neoplasm of upper-outer quadrant of left breast in female, estrogen receptor positive (HCC); Diabetic retinopathy (HCC); Anemia; Carotid artery aneurysm (HCC); Chemotherapy-induced peripheral neuropathy (HCC); Peripheral edema; Aortic atherosclerosis (HCC); Atherosclerosis of coronary artery of native heart without angina pectoris; Bronchiectasis without complication (HCC);  Decreased GFR; Vitamin B 12 deficiency; Leukopenia; Elevated ferritin; Overweight (BMI 25.0-29.9); Burning with urination; Abdominal pain, left lower quadrant; Acute diverticulitis; Chronic cough; Reactive airways dysfunction syndrome (HCC); Acute cough; Hypercalcemia; and Pre-syncope on their problem list. she  has a past medical history of Allergy, Anemia, Aneurysm (HCC), Blood transfusion without reported diagnosis, Cancer (HCC), Coronary artery disease, Diverticulitis, DJD (degenerative joint disease), DM type 2 (diabetes mellitus, type 2) (HCC), F Fibrocystic breast disease, GERD (gastroesophageal reflux disease), History of chicken pox, Hypercholesteremia, Hypertension, Lichen planopilaris, Neuromuscular disorder (HCC), Sleep apnea, Urinary incontinence, and Vertigo (12/2019). she  has a past surgical history that includes Placement of breast implants (12/26/2004); Dilation and curettage of uterus (01/1994); Finger surgery (2001 &  2003); Breast surgery (02/1975); Rhinoplasty (03/1964); Foot surgery (1980); Breast surgery (10/1981); Breast surgery (07/1998); Foot surgery (07/2008); Breast surgery (01/1976); Cholecystectomy (N/A, 07/08/2016); Joint replacement R THA (04/19/2013);  Mastectomy w/ sentinel node biopsy (Bilateral, 03/08/2019); Breast implant removal (Bilateral, 03/08/2019); Replacement total knee (Left); Cataract extraction w/PHACO (Right, 02/08/2020); Cataract extraction w/PHACO (Left, 02/29/2020); Mastectomy; and Partial knee arthroplasty (Right, 12/31/2022) are also affecting patient's functional outcome.   REHAB POTENTIAL: Good CLINICAL DECISION MAKING: Evolving/moderate complexity EVALUATION COMPLEXITY: Moderate  GOALS: Goals reviewed with patient? No SHORT TERM GOALS: Target date: 12/24/2023  Patient will be independent with initial home exercise program for self-management of symptoms. Baseline: Initial HEP to be provided at visit 2 as appropriate (12/10/23); Goal status: MET  LONG  TERM GOALS: Target date: 03/03/2024  Patient will be independent with a long-term home exercise program for self-management of symptoms.  Baseline: Initial HEP to be provided at visit 2 as appropriate (12/10/23); completes exercises approximately half the days since last PT session (01/20/2024);  Goal status: In progress  2.  Patient will demonstrate improvement in Patient Specific Functional Scale (PSFS) of equal or greater than 8/10 points to reflect clinically significant improvement in patient's most valued functional activities. Baseline: to be measured at visit 2 as appropriate (12/10/23); 4.5/10 (12/15/2023); 9.75/10 (01/20/2024);  Goal status: MET  3.  Patient will demonstrate the ability to go up and down 12 steps without concordant pain to improve her household and community mobility.  Baseline: pain with stairs (12/10/23); no pain with stairs (01/20/2024);  Goal status: MET  4.  Patient will report NPRS equal or less than 3/10 during functional activities during the last 2 weeks to improve their abilitly to complete community, work and/or recreational activities with less limitation. Baseline: 7/10 (12/10/23); 7-8/10 but less frequent, this much pain is not very often (01/20/2024);  Goal status: In progress  PLAN:  PT FREQUENCY: 1-2x/week  PT DURATION: 8-12 weeks  PLANNED INTERVENTIONS: 97164- PT Re-evaluation, 97750- Physical Performance Testing, 97110-Therapeutic exercises, 97530- Therapeutic activity, [id number]- Neuromuscular re-education, 97535- Self Care, 16109- Manual therapy, [id number]- Electrical stimulation (unattended), Patient/Family education, Stair training, Dry Needling, Joint mobilization, Spinal mobilization, Cryotherapy, and Moist heat  PLAN FOR NEXT SESSION: update HEP as appropriate, progressive loading to Rt adductor and hamstring group as tolerated. Education. Manual therapy as appropriate.   Carilyn Charles. Artemio Larry, PT, DPT 01/20/24, 10:13 AM  [facility] [address] [place], [place] [address] P: [phone] I F: [phone]

## 2024-01-22 ENCOUNTER — Ambulatory Visit: Admitting: Physical Therapy

## 2024-01-22 DIAGNOSIS — G8929 Other chronic pain: Secondary | ICD-10-CM | POA: Diagnosis not present

## 2024-01-22 DIAGNOSIS — M79651 Pain in right thigh: Secondary | ICD-10-CM

## 2024-01-22 DIAGNOSIS — M25561 Pain in right knee: Secondary | ICD-10-CM | POA: Diagnosis not present

## 2024-01-22 NOTE — Therapy (Signed)
 OUTPATIENT PHYSICAL THERAPY TREATMENT    Patient Name: Katie [name] MRN: [id number] DOB:08/27/1945, 78 y.o., female Today's Date: 01/22/2024  END OF SESSION:  PT End of Session - 01/22/24 0936     Visit Number 12    Number of Visits 20    Date for PT Re-Evaluation 03/03/24    Authorization Type [employer] MEDIC[employer] reporting period from 12/10/2023    Authorization Time Period auth 20 visits  4/16-6/20  Authorization #[id number]    Authorization - Number of Visits 20    Progress Note Due on Visit 10    PT Start Time 0904    PT Stop Time 0942    PT Time Calculation (min) 38 min    Activity Tolerance Patient tolerated treatment well;No increased pain    Behavior During Therapy WFL for tasks assessed/performed                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, small, not treating-watching , it is now stented   Blood transfusion without reported diagnosis    Cancer (HCC)    breast 08/2018   Coronary artery disease    Diverticulitis    DJD (degenerative joint disease)    DM type 2 (diabetes mellitus, type 2) (HCC)    Family history of breast cancer    Fibrocystic breast disease    GERD (gastroesophageal reflux disease)    History of chicken pox    Hypercholesteremia    Hypertension    Lichen planopilaris    Neuromuscular disorder (HCC)    Sleep apnea    cpap   Urinary incontinence    Vertigo 12/2019   1 episode   Past Surgical History:  Procedure Laterality Date   BREAST IMPLANT REMOVAL Bilateral 03/08/2019   Procedure: Removal of Bilateral Breast Implants;  Surgeon: Enid Harry, MD;  Location: [facility] OR;  Service: General;  Laterality: Bilateral;   BREAST SURGERY  02/1975   Left Breast Biopsy-Fibrocystic Disease   BREAST SURGERY  10/1981   Bialteral Capsulectomy Breast Surgery   BREAST SURGERY  07/1998   Replace Bilateral Silicone Implants   BREAST SURGERY  01/1976   Bilater breast surgery to remove  fibrocystic tissue and replace with silicone implants   CATARACT EXTRACTION W/PHACO Right 02/08/2020   Procedure: CATARACT EXTRACTION PHACO AND INTRAOCULAR LENS PLACEMENT (IOC) RIGHT DIABETIC;  Surgeon: Clair Crews, MD;  Location: [facility];  Service: Ophthalmology;  Laterality: Right;  3.55 0:33.7   CATARACT EXTRACTION W/PHACO Left 02/29/2020   Procedure: CATARACT EXTRACTION PHACO AND INTRAOCULAR LENS PLACEMENT (IOC) LEFT DIABETIC 2.93  00:27.1;  Surgeon: Clair Crews, MD;  Location: [facility];  Service: Ophthalmology;  Laterality: Left;  Diabetic - oral meds   CHOLECYSTECTOMY N/A 07/08/2016   Procedure: LAPAROSCOPIC CHOLECYSTECTOMY;  Surgeon: Gwyndolyn Lerner, MD;  Location: [facility] ORS;  Service: General;  Laterality: N/A;   COLONOSCOPY WITH PROPOFOL  N/A 11/07/2017   Procedure: COLONOSCOPY WITH PROPOFOL ;  Surgeon: Luke Salaam, MD;  Location: [facility] ENDOSCOPY;  Service: Gastroenterology;  Laterality: N/A;   DILATION AND CURETTAGE OF UTERUS  01/1994   ESOPHAGOGASTRODUODENOSCOPY (EGD) WITH PROPOFOL  N/A 12/05/2016   Procedure: ESOPHAGOGASTRODUODENOSCOPY (EGD) WITH PROPOFOL ;  Surgeon: Luke Salaam, MD;  Location: [facility] ENDOSCOPY;  Service: Endoscopy;  Laterality: N/A;   FINGER SURGERY  2001 & 2003   Trigger Finger   FOOT SURGERY  1980   To Relieve Pinched Nerve   FOOT  SURGERY  07/2008   Left Foot-Plantar Fasciitis   JOINT REPLACEMENT  04/19/2013   Hip Replacement-Right   MASTECTOMY     double   MASTECTOMY W/ SENTINEL NODE BIOPSY Bilateral 03/08/2019   Procedure: BILATERAL MASTECTOMIES WITH LEFT AXILLARY SENTINEL LYMPH NODE BIOPSY AND BLUE DYE INJECTION;  Surgeon: Enid Harry, MD;  Location: [facility] OR;  Service: General;  Laterality: Bilateral;   PARTIAL KNEE ARTHROPLASTY Right 12/31/2022   Procedure: RIGHT PARTIAL KNEE ARTHROPLASTY;  Surgeon: Elner Hahn, MD;  Location: [facility] ORS;  Service: Orthopedics;  Laterality: Right;   PLACEMENT OF BREAST IMPLANTS  12/26/2004    Replace Failed Implants   REPLACEMENT TOTAL KNEE Left    2019-   RHINOPLASTY  03/1964   To Correct Deviated Septum   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/2023   Acute diverticulitis 03/21/2023   Abdominal pain, left lower quadrant 03/16/2023   Burning with urination 03/07/2023   Overweight (BMI 25.0-29.9) 03/05/2022   Elevated ferritin 03/23/2021   Decreased GFR 02/23/2021   Vitamin B 12 deficiency 02/23/2021   Leukopenia 02/23/2021   Bronchiectasis without complication (HCC) 01/26/2020   Aortic atherosclerosis (HCC) 12/23/2019   Atherosclerosis of coronary artery of native heart without angina pectoris 12/23/2019   Peripheral edema 12/22/2019   Chemotherapy-induced peripheral neuropathy (HCC) 10/29/2019   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   Anemia 10/15/2018   Diabetic retinopathy (HCC) 09/29/2018   Family history of leukemia 09/16/2018   Family history of breast cancer    Malignant neoplasm of upper-outer quadrant of left breast in female, 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/11/2018   Family history of breast cancer in sister 08/28/2018   Diverticular disease 10/17/2017   Primary osteoarthritis of right knee 03/07/2017   HSV-1 (herpes simplex virus 1) infection, 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   Vitamin D  deficiency 10/11/2016   Family history of thyroid  disorder 04/04/2015   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   Counseling regarding end of life decision making 10/04/2014   OA (osteoarthritis) of neck 08/02/2014   History of duodenal ulcer 08/02/2014   Seasonal allergies 08/02/2014   Hypertension associated with diabetes (HCC) 08/02/2014   Hyperlipidemia associated with type 2 diabetes  mellitus (HCC) 08/02/2014   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   History of joint replacement 03/01/2014   Diabetes mellitus type 2 with retinopathy (HCC) 11/07/2009   PCP: Judithann Novas, MD REFERRING PROVIDER: Rojelio Clement, PA  REFERRING DIAG: status post right partial knee replacement, hamstring tendinosis of right thigh, strain of right hip adductor muscle  THERAPY DIAG:  Pain in right thigh  Chronic pain of right knee  Rationale for Evaluation and Treatment: Rehabilitation  ONSET DATE: September/October 2024  SUBJECTIVE:   PERTINENT HISTORY: Patient is a 78 y.o. female who presents to outpatient physical therapy with a referral for medical diagnosis status post right partial knee replacement, hamstring tendinosis of right thigh, strain of right hip adductor muscle. This patient's chief complaints consist of pain at the distal medial right thigh, leading to the following functional deficits: difficulty with sleeping, exercising, walking, going up and down stairs,  doing ADLs and IADLs, lifting an object of the floor, performing heavy activities around the home, rolling over in bed. Relevant past medical history and comorbidities  include the following: she has Diabetes mellitus type 2 with retinopathy (HCC); OA (osteoarthritis) of neck; History of duodenal ulcer; Seasonal allergies; Hypertension associated with diabetes (HCC); Hyperlipidemia associated with type 2 diabetes mellitus (HCC); Lichen plano-pilaris; Osteoporosis of forearm; History of joint replacement; Vitamin D  deficiency; HSV-1 (herpes simplex virus 1) infection, vaginal; Primary osteoarthritis of right knee; Diverticular disease; Malignant neoplasm of upper-outer quadrant of left breast in female, estrogen receptor positive (HCC); Diabetic retinopathy (HCC); Anemia; Carotid artery aneurysm (HCC); Chemotherapy-induced peripheral neuropathy (HCC); Peripheral edema; Aortic atherosclerosis (HCC); Atherosclerosis of coronary  artery of native heart without angina pectoris; Bronchiectasis without complication (HCC); Decreased GFR; Vitamin B 12 deficiency; Leukopenia; Elevated ferritin; Overweight (BMI 25.0-29.9); Burning with urination; Abdominal pain, left lower quadrant; Acute diverticulitis; Chronic cough; Reactive airways dysfunction syndrome (HCC); Acute cough; Hypercalcemia; and Pre-syncope on their problem list. she  has a past medical history of Allergy, Anemia, Aneurysm (HCC), Blood transfusion without reported diagnosis, Cancer (HCC), Coronary artery disease, Diverticulitis, DJD (degenerative joint disease), DM type 2 (diabetes mellitus, type 2) (HCC), F Fibrocystic breast disease, GERD (gastroesophageal reflux disease), History of chicken pox, Hypercholesteremia, Hypertension, Lichen planopilaris, Neuromuscular disorder (HCC), Sleep apnea, Urinary incontinence, and Vertigo (12/2019). she  has a past surgical history that includes Placement of breast implants (12/26/2004); Dilation and curettage of uterus (01/1994); Finger surgery (2001 & 2003); Breast surgery (02/1975); Rhinoplasty (03/1964); Foot surgery (1980); Breast surgery (10/1981); Breast surgery (07/1998); Foot surgery (07/2008); Breast surgery (01/1976); Cholecystectomy (N/A, 07/08/2016); Joint replacement R THA (04/19/2013);  Mastectomy w/ sentinel node biopsy (Bilateral, 03/08/2019); Breast implant removal (Bilateral, 03/08/2019); Replacement total knee (Left); Cataract extraction w/PHACO (Right, 02/08/2020); Cataract extraction w/PHACO (Left, 02/29/2020); Mastectomy; and Partial knee arthroplasty (Right, 12/31/2022). Patient denies hx of stroke, seizures, unexplained weight loss, unexplained changes in bowel or bladder problems, unexplained stumbling or dropping things, and spinal surgery. Has osteoporosis in forearm (spine steady), almost 5 years out from breast cancer, has some lung damage from chemo, and has CAD but not cardiac events .  Exercise history: Usually  goes to the gym pretty regularly with her husband. Mostly does the Nustep since her knee surgery. Also has done the treadmill. Avoids the machines if her concordant pain is actively hurting. She is getting a new recumbent bike.   SUBJECTIVE STATEMENT: Patient reports she was a little sore all over after last PT session. She did her daily HEP yesterday but not her 3 times a week HEP since she was coming to PT today. She had some concordant pain this morning getting out of bed.   PAIN: NPRS: Current: 2-3/10 medial side of distal posterior thigh Best: 0/10, Worst: 7-8/10. Pain location: medial side of distal posterior thigh Pain description: sharp then gradually fades, lasts a few minutes.  Aggravating factors: turning over in the bed or lifting her R leg up when she is laying on her back.  Relieving factors: puts voltaren gel on it (unsure if it helps).       FUNCTIONAL LIMITATIONS: difficulty with sleeping, exercising, walking, going up and down stairs,  doing ADLs and IADLs, lifting an object of the floor, performing heavy activities around the home, rolling over in bed.  LEISURE: goes to the gym usually 2-3 times a week, traveling, exercising, going to the movies, pretty active for age. She has a set of 2# and 3# DB at home, recumbent bike.    PRECAUTIONS: None, used to be not allowed to lift anything over 30# due to aneurism that was repaired  WEIGHT  BEARING RESTRICTIONS: No  FALLS:  Has patient fallen in last 6 months? 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   NEXT MD VISIT:   OBJECTIVE                                                                                                                             TREATMENT Therapeutic exercise: therapeutic exercises that incorporate ONE parameter at one or more areas of the body to centralize symptoms,  develop strength and endurance, range of motion, and flexibility required for successful completion of functional activities.   NuStep using bilateral upper and lower extremities. For improved extremity mobility, muscular endurance, and activity tolerance; and to induce the analgesic effect of aerobic exercise, stimulate improved joint nutrition, and prepare body structures and systems for following interventions. Also to reinforce understanding of appropriate exercise intensity to help meet physical activity guidelines for health.  Seat/handle setting: 6/8 5  minutes Level: 3 Target SPM: > 100 Average SPM: 84 RPE: 8/10  Therapeutic activities: dynamic therapeutic activities incorporating MULTIPLE parameters or areas of the body designed to achieve improved functional performance.  Standing lateral lunge to 4 inch step with U UE support 3x15 each side with 10#DB in contralateral UE  Good carry over from last visit  Standing RDL holding end of 15# DB from floor/yoga block to max ROM with braced back 3x10 Cuing for flat back and scapular retraction with lat activation Good carry over with set up  Hip hikes off edge of step with UE support on standing side.  2x20 each side  Manual therapy: to reduce pain and tissue tension, improve range of motion, neuromodulation, in order to promote improved ability to complete functional activities. SUPINE/HOOKLYING STM to right hip adductor muscle group  Pt required multimodal cuing for proper technique and to facilitate improved neuromuscular control, strength, range of motion, and functional ability resulting in improved performance and form.  Trigger Point Dry Needling   Subsequent Treatment: Instructions provided previously at initial dry needling treatment.  Instructions reviewed, if requested by the patient, prior to subsequent dry needling treatment.    Patient Verbal Consent Given: Yes Education Handout Provided: Yes Muscles Treated: 1  dry needle(s) .25mm x 60mm inserted with 3 sticks into right hip adductor group muscles with patient in supine position to decrease pain and spasms along patient's right inner thigh and posterior knee region.   Electrical Stimulation Performed: No Treatment Response/Outcome: Patient feels needle minimally but reports relief following dry needling and manual therapy. Bruise noted at 2nd needling site - pressure applied. No unexpected outcomes.   PATIENT EDUCATION:  Education details: Exercise purpose/form. Self management techniques. Dry needling. HEP with handout.  Person educated: Patient Education method: Explanation, Demonstration, and Handouts Education comprehension: verbalized understanding and needs further education  HOME EXERCISE PROGRAM: Access Code: [email or web address] URL: [email or web address] Date:  01/20/2024 Prepared by: Alleen Isle  Exercises - Supine Extended Bridge on Heels  - 1 x daily - 1 sets - 3 reps - 45 seconds hold - Hip Adductors and Hamstring Stretch with Strap  - 1 x daily - 3 reps - 45 seconds hold - Hip Hikes off step  - 1 x daily - 3 sets - 15 reps - Side Lunge with Counter Support  - 2-3 x daily - 3 sets - 15 reps - Kettlebell Deadlift  - 2-3 x weekly - 3 sets - 10 reps  ASSESSMENT:  CLINICAL IMPRESSION: Patient demonstrated good carry over with new gym-based exercises but continued to need cuing for flat back and scapular retraction with RDL. She continues to get relief from dry needling but does not plan to continue with this after today due to financial reasons. Plan to continue with exercises for hips and hamstrings. Patient would benefit from continued management of limiting condition by skilled physical therapist to address remaining impairments and functional limitations to work towards stated goals and return to PLOF or maximal functional independence.   OBJECTIVE IMPAIRMENTS: decreased activity tolerance, decreased knowledge of condition,  decreased mobility, difficulty walking, and pain.   ACTIVITY LIMITATIONS: lifting, sleeping, and stairs  PARTICIPATION LIMITATIONS: community activity and  difficulty with sleeping, exercising, walking, going up and down stairs,  doing ADLs and IADLs, lifting an object of the floor, performing heavy activities around the home, rolling over in bed  PERSONAL FACTORS: Age, Fitness, Past/current experiences, Time since onset of injury/illness/exacerbation, and 3+ comorbidities:  Diabetes mellitus type 2 with retinopathy (HCC); OA (osteoarthritis) of neck; History of duodenal ulcer; Seasonal allergies; Hypertension associated with diabetes (HCC); Hyperlipidemia associated with type 2 diabetes mellitus (HCC); Lichen plano-pilaris; Osteoporosis of forearm; History of joint replacement; Vitamin D  deficiency; HSV-1 (herpes simplex virus 1) infection, vaginal; Primary osteoarthritis of right knee; Diverticular disease; Malignant neoplasm of upper-outer quadrant of left breast in female, estrogen receptor positive (HCC); Diabetic retinopathy (HCC); Anemia; Carotid artery aneurysm (HCC); Chemotherapy-induced peripheral neuropathy (HCC); Peripheral edema; Aortic atherosclerosis (HCC); Atherosclerosis of coronary artery of native heart without angina pectoris; Bronchiectasis without complication (HCC); Decreased GFR; Vitamin B 12 deficiency; Leukopenia; Elevated ferritin; Overweight (BMI 25.0-29.9); Burning with urination; Abdominal pain, left lower quadrant; Acute diverticulitis; Chronic cough; Reactive airways dysfunction syndrome (HCC); Acute cough; Hypercalcemia; and Pre-syncope on their problem list. she  has a past medical history of Allergy, Anemia, Aneurysm (HCC), Blood transfusion without reported diagnosis, Cancer (HCC), Coronary artery disease, Diverticulitis, DJD (degenerative joint disease), DM type 2 (diabetes mellitus, type 2) (HCC), F Fibrocystic breast disease, GERD (gastroesophageal reflux disease),  History of chicken pox, Hypercholesteremia, Hypertension, Lichen planopilaris, Neuromuscular disorder (HCC), Sleep apnea, Urinary incontinence, and Vertigo (12/2019). she  has a past surgical history that includes Placement of breast implants (12/26/2004); Dilation and curettage of uterus (01/1994); Finger surgery (2001 & 2003); Breast surgery (02/1975); Rhinoplasty (03/1964); Foot surgery (1980); Breast surgery (10/1981); Breast surgery (07/1998); Foot surgery (07/2008); Breast surgery (01/1976); Cholecystectomy (N/A, 07/08/2016); Joint replacement R THA (04/19/2013);  Mastectomy w/ sentinel node biopsy (Bilateral, 03/08/2019); Breast implant removal (Bilateral, 03/08/2019); Replacement total knee (Left); Cataract extraction w/PHACO (Right, 02/08/2020); Cataract extraction w/PHACO (Left, 02/29/2020); Mastectomy; and Partial knee arthroplasty (Right, 12/31/2022) are also affecting patient's functional outcome.   REHAB POTENTIAL: Good CLINICAL DECISION MAKING: Evolving/moderate complexity EVALUATION COMPLEXITY: Moderate  GOALS: Goals reviewed with patient? No SHORT TERM GOALS: Target date: 12/24/2023  Patient will be independent with initial home exercise program for self-management of symptoms.  Baseline: Initial HEP to be provided at visit 2 as appropriate (12/10/23); Goal status: MET  LONG TERM GOALS: Target date: 03/03/2024  Patient will be independent with a long-term home exercise program for self-management of symptoms.  Baseline: Initial HEP to be provided at visit 2 as appropriate (12/10/23); completes exercises approximately half the days since last PT session (01/20/2024);  Goal status: In progress  2.  Patient will demonstrate improvement in Patient Specific Functional Scale (PSFS) of equal or greater than 8/10 points to reflect clinically significant improvement in patient's most valued functional activities. Baseline: to be measured at visit 2 as appropriate (12/10/23); 4.5/10 (12/15/2023);  9.75/10 (01/20/2024);  Goal status: MET  3.  Patient will demonstrate the ability to go up and down 12 steps without concordant pain to improve her household and community mobility.  Baseline: pain with stairs (12/10/23); no pain with stairs (01/20/2024);  Goal status: MET  4.  Patient will report NPRS equal or less than 3/10 during functional activities during the last 2 weeks to improve their abilitly to complete community, work and/or recreational activities with less limitation. Baseline: 7/10 (12/10/23); 7-8/10 but less frequent, this much pain is not very often (01/20/2024);  Goal status: In progress  PLAN:  PT FREQUENCY: 1-2x/week  PT DURATION: 8-12 weeks  PLANNED INTERVENTIONS: 97164- PT Re-evaluation, 97750- Physical Performance Testing, 97110-Therapeutic exercises, 97530- Therapeutic activity, [id number]- Neuromuscular re-education, 97535- Self Care, [id number]- Manual therapy, [id number]- Electrical stimulation (unattended), Patient/Family education, Stair training, Dry Needling, Joint mobilization, Spinal mobilization, Cryotherapy, and Moist heat  PLAN FOR NEXT SESSION: update HEP as appropriate, progressive loading to hip with focus on Rt adductor and hamstring groups as tolerated. Education. Manual therapy as appropriate.   Carilyn Charles. Artemio Larry, PT, DPT 01/22/24, 9:43 AM  [facility] [address] [place], [place] [address] P: [phone] I F: [phone]

## 2024-01-26 ENCOUNTER — Ambulatory Visit: Attending: Student | Admitting: Physical Therapy

## 2024-01-26 ENCOUNTER — Encounter: Payer: Self-pay | Admitting: Physical Therapy

## 2024-01-26 DIAGNOSIS — M79651 Pain in right thigh: Secondary | ICD-10-CM | POA: Diagnosis not present

## 2024-01-26 DIAGNOSIS — M25561 Pain in right knee: Secondary | ICD-10-CM | POA: Diagnosis not present

## 2024-01-26 DIAGNOSIS — G8929 Other chronic pain: Secondary | ICD-10-CM | POA: Diagnosis not present

## 2024-01-26 NOTE — Therapy (Signed)
 OUTPATIENT PHYSICAL THERAPY TREATMENT    Patient Name: Katie [name] MRN: [id number] DOB:Aug 09, 1946, 78 y.o., female Today's Date: 01/26/2024  END OF SESSION:  PT End of Session - 01/26/24 1139     Visit Number 13    Number of Visits 20    Date for PT Re-Evaluation 03/03/24    Authorization Type [employer] MEDIC[employer] reporting period from 12/10/2023    Authorization Time Period auth 20 visits  4/16-6/20  Authorization #[id number]    Authorization - Visit Number 13    Authorization - Number of Visits 20    Progress Note Due on Visit 10    PT Start Time 1122    PT Stop Time 1202    PT Time Calculation (min) 40 min    Activity Tolerance Patient tolerated treatment well;No increased pain    Behavior During Therapy WFL for tasks assessed/performed                 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, small, not treating-watching , it is now stented   Blood transfusion without reported diagnosis    Cancer (HCC)    breast 08/2018   Coronary artery disease    Diverticulitis    DJD (degenerative joint disease)    DM type 2 (diabetes mellitus, type 2) (HCC)    Family history of breast cancer    Fibrocystic breast disease    GERD (gastroesophageal reflux disease)    History of chicken pox    Hypercholesteremia    Hypertension    Lichen planopilaris    Neuromuscular disorder (HCC)    Sleep apnea    cpap   Urinary incontinence    Vertigo 12/2019   1 episode   Past Surgical History:  Procedure Laterality Date   BREAST IMPLANT REMOVAL Bilateral 03/08/2019   Procedure: Removal of Bilateral Breast Implants;  Surgeon: Enid Harry, MD;  Location: [facility] OR;  Service: General;  Laterality: Bilateral;   BREAST SURGERY  02/1975   Left Breast Biopsy-Fibrocystic Disease   BREAST SURGERY  10/1981   Bialteral Capsulectomy Breast Surgery   BREAST SURGERY  07/1998   Replace Bilateral Silicone Implants   BREAST SURGERY   01/1976   Bilater breast surgery to remove fibrocystic tissue and replace with silicone implants   CATARACT EXTRACTION W/PHACO Right 02/08/2020   Procedure: CATARACT EXTRACTION PHACO AND INTRAOCULAR LENS PLACEMENT (IOC) RIGHT DIABETIC;  Surgeon: Clair Crews, MD;  Location: [facility];  Service: Ophthalmology;  Laterality: Right;  3.55 0:33.7   CATARACT EXTRACTION W/PHACO Left 02/29/2020   Procedure: CATARACT EXTRACTION PHACO AND INTRAOCULAR LENS PLACEMENT (IOC) LEFT DIABETIC 2.93  00:27.1;  Surgeon: Clair Crews, MD;  Location: [facility];  Service: Ophthalmology;  Laterality: Left;  Diabetic - oral meds   CHOLECYSTECTOMY N/A 07/08/2016   Procedure: LAPAROSCOPIC CHOLECYSTECTOMY;  Surgeon: Gwyndolyn Lerner, MD;  Location: [facility] ORS;  Service: General;  Laterality: N/A;   COLONOSCOPY WITH PROPOFOL  N/A 11/07/2017   Procedure: COLONOSCOPY WITH PROPOFOL ;  Surgeon: Luke Salaam, MD;  Location: [facility] ENDOSCOPY;  Service: Gastroenterology;  Laterality: N/A;   DILATION AND CURETTAGE OF UTERUS  01/1994   ESOPHAGOGASTRODUODENOSCOPY (EGD) WITH PROPOFOL  N/A 12/05/2016   Procedure: ESOPHAGOGASTRODUODENOSCOPY (EGD) WITH PROPOFOL ;  Surgeon: Luke Salaam, MD;  Location: [facility] ENDOSCOPY;  Service: Endoscopy;  Laterality: N/A;   FINGER SURGERY  2001 & 2003   Trigger Finger   FOOT SURGERY  1980  To Relieve Pinched Nerve   FOOT SURGERY  07/2008   Left Foot-Plantar Fasciitis   JOINT REPLACEMENT  04/19/2013   Hip Replacement-Right   MASTECTOMY     double   MASTECTOMY W/ SENTINEL NODE BIOPSY Bilateral 03/08/2019   Procedure: BILATERAL MASTECTOMIES WITH LEFT AXILLARY SENTINEL LYMPH NODE BIOPSY AND BLUE DYE INJECTION;  Surgeon: Enid Harry, MD;  Location: [facility] OR;  Service: General;  Laterality: Bilateral;   PARTIAL KNEE ARTHROPLASTY Right 12/31/2022   Procedure: RIGHT PARTIAL KNEE ARTHROPLASTY;  Surgeon: Elner Hahn, MD;  Location: [facility] ORS;  Service: Orthopedics;  Laterality: Right;    PLACEMENT OF BREAST IMPLANTS  12/26/2004   Replace Failed Implants   REPLACEMENT TOTAL KNEE Left    2019-   RHINOPLASTY  03/1964   To Correct Deviated Septum   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/2023   Acute diverticulitis 03/21/2023   Abdominal pain, left lower quadrant 03/16/2023   Burning with urination 03/07/2023   Overweight (BMI 25.0-29.9) 03/05/2022   Elevated ferritin 03/23/2021   Decreased GFR 02/23/2021   Vitamin B 12 deficiency 02/23/2021   Leukopenia 02/23/2021   Bronchiectasis without complication (HCC) 01/26/2020   Aortic atherosclerosis (HCC) 12/23/2019   Atherosclerosis of coronary artery of native heart without angina pectoris 12/23/2019   Peripheral edema 12/22/2019   Chemotherapy-induced peripheral neuropathy (HCC) 10/29/2019   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   Anemia 10/15/2018   Diabetic retinopathy (HCC) 09/29/2018   Family history of leukemia 09/16/2018   Family history of breast cancer    Malignant neoplasm of upper-outer quadrant of left breast in female, 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/11/2018   Family history of breast cancer in sister 08/28/2018   Diverticular disease 10/17/2017   Primary osteoarthritis of right knee 03/07/2017   HSV-1 (herpes simplex virus 1) infection, 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   Vitamin D  deficiency 10/11/2016   Family history of thyroid  disorder 04/04/2015   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   Counseling regarding end of life decision making 10/04/2014   OA (osteoarthritis) of neck 08/02/2014   History of duodenal ulcer 08/02/2014   Seasonal allergies 08/02/2014   Hypertension associated with diabetes (HCC) 08/02/2014    Hyperlipidemia associated with type 2 diabetes mellitus (HCC) 08/02/2014   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   History of joint replacement 03/01/2014   Diabetes mellitus type 2 with retinopathy (HCC) 11/07/2009   PCP: Judithann Novas, MD REFERRING PROVIDER: Rojelio Clement, PA  REFERRING DIAG: status post right partial knee replacement, hamstring tendinosis of right thigh, strain of right hip adductor muscle  THERAPY DIAG:  Pain in right thigh  Chronic pain of right knee  Rationale for Evaluation and Treatment: Rehabilitation  ONSET DATE: September/October 2024  SUBJECTIVE:   PERTINENT HISTORY: Patient is a 78 y.o. female who presents to outpatient physical therapy with a referral for medical diagnosis status post right partial knee replacement, hamstring tendinosis of right thigh, strain of right hip adductor muscle. This patient's chief complaints consist of pain at the distal medial right thigh, leading to the following functional deficits: difficulty with sleeping, exercising, walking, going up and down stairs,  doing ADLs and IADLs, lifting an object of the floor, performing heavy activities around the home, rolling over in  bed. Relevant past medical history and comorbidities include the following: she has Diabetes mellitus type 2 with retinopathy (HCC); OA (osteoarthritis) of neck; History of duodenal ulcer; Seasonal allergies; Hypertension associated with diabetes (HCC); Hyperlipidemia associated with type 2 diabetes mellitus (HCC); Lichen plano-pilaris; Osteoporosis of forearm; History of joint replacement; Vitamin D  deficiency; HSV-1 (herpes simplex virus 1) infection, vaginal; Primary osteoarthritis of right knee; Diverticular disease; Malignant neoplasm of upper-outer quadrant of left breast in female, estrogen receptor positive (HCC); Diabetic retinopathy (HCC); Anemia; Carotid artery aneurysm (HCC); Chemotherapy-induced peripheral neuropathy (HCC); Peripheral edema; Aortic  atherosclerosis (HCC); Atherosclerosis of coronary artery of native heart without angina pectoris; Bronchiectasis without complication (HCC); Decreased GFR; Vitamin B 12 deficiency; Leukopenia; Elevated ferritin; Overweight (BMI 25.0-29.9); Burning with urination; Abdominal pain, left lower quadrant; Acute diverticulitis; Chronic cough; Reactive airways dysfunction syndrome (HCC); Acute cough; Hypercalcemia; and Pre-syncope on their problem list. she  has a past medical history of Allergy, Anemia, Aneurysm (HCC), Blood transfusion without reported diagnosis, Cancer (HCC), Coronary artery disease, Diverticulitis, DJD (degenerative joint disease), DM type 2 (diabetes mellitus, type 2) (HCC), F Fibrocystic breast disease, GERD (gastroesophageal reflux disease), History of chicken pox, Hypercholesteremia, Hypertension, Lichen planopilaris, Neuromuscular disorder (HCC), Sleep apnea, Urinary incontinence, and Vertigo (12/2019). she  has a past surgical history that includes Placement of breast implants (12/26/2004); Dilation and curettage of uterus (01/1994); Finger surgery (2001 & 2003); Breast surgery (02/1975); Rhinoplasty (03/1964); Foot surgery (1980); Breast surgery (10/1981); Breast surgery (07/1998); Foot surgery (07/2008); Breast surgery (01/1976); Cholecystectomy (N/A, 07/08/2016); Joint replacement R THA (04/19/2013);  Mastectomy w/ sentinel node biopsy (Bilateral, 03/08/2019); Breast implant removal (Bilateral, 03/08/2019); Replacement total knee (Left); Cataract extraction w/PHACO (Right, 02/08/2020); Cataract extraction w/PHACO (Left, 02/29/2020); Mastectomy; and Partial knee arthroplasty (Right, 12/31/2022). Patient denies hx of stroke, seizures, unexplained weight loss, unexplained changes in bowel or bladder problems, unexplained stumbling or dropping things, and spinal surgery. Has osteoporosis in forearm (spine steady), almost 5 years out from breast cancer, has some lung damage from chemo, and has CAD  but not cardiac events .  Exercise history: Usually goes to the gym pretty regularly with her husband. Mostly does the Nustep since her knee surgery. Also has done the treadmill. Avoids the machines if her concordant pain is actively hurting. She is getting a new recumbent bike.   SUBJECTIVE STATEMENT: Patient reports her leg is better except she continues to have pain in the morning that gets better once she gets moving. She did her HEP about half as much as she should have since last PT session. She did not go to the gym so she did not get to do the heavy weights. She tried to find something heavy around the house. She tried a gallon of water but that was kind of awkward.   PAIN: NPRS: Current: 2/10 medial side of distal posterior thigh Best: 0/10, Worst: 7-8/10. Pain location: medial side of distal posterior thigh Pain description: sharp then gradually fades, lasts a few minutes.  Aggravating factors: turning over in the bed or lifting her R leg up when she is laying on her back.  Relieving factors: puts voltaren gel on it (unsure if it helps).       FUNCTIONAL LIMITATIONS: difficulty with sleeping, exercising, walking, going up and down stairs,  doing ADLs and IADLs, lifting an object of the floor, performing heavy activities around the home, rolling over in bed.  LEISURE: goes to the gym usually 2-3 times a week, traveling, exercising, going to the movies, pretty active  for age. She has a set of 2# and 3# DB at home, recumbent bike.    PRECAUTIONS: None, used to be not allowed to lift anything over 30# due to aneurism that was repaired  WEIGHT BEARING RESTRICTIONS: No  FALLS:  Has patient fallen in last 6 months? 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   NEXT MD VISIT:   OBJECTIVE                                                                                                                              TREATMENT Therapeutic exercise: therapeutic exercises that incorporate ONE parameter at one or more areas of the body to centralize symptoms, develop strength and endurance, range of motion, and flexibility required for successful completion of functional activities.   NuStep using bilateral upper and lower extremities. For improved extremity mobility, muscular endurance, and activity tolerance; and to induce the analgesic effect of aerobic exercise, stimulate improved joint nutrition, and prepare body structures and systems for following interventions. Also to reinforce understanding of appropriate exercise intensity to help meet physical activity guidelines for health.  Seat/handle setting: 6/8 5  minutes Level: 4 Target SPM: > 90 Average SPM: 95 RPE: 8/10  Standing lumbar extension with hands on pelvis 1x10  Standing hip adductor squeeze into Pilates ring placed at ankles with B UE support 3x15 each side  Seated hip adductor stretch in modified pancake position 3x1 minute  Seated on 8 inch step leaning forwards to 6 inch yoga block Min assistance from PT with getting up from stool.   Therapeutic activities: dynamic therapeutic activities incorporating MULTIPLE parameters or areas of the body designed to achieve improved functional performance.  Standing lateral lunge with U UE support 3x15 each side with 10#DB in contralateral UE  Good carry over from last visit  Standing RDL holding end of 15# DB from floor/yoga block to max ROM with braced back 3x15 Cuing for flat back and scapular retraction with lat activation Good carry over with set up  Hip hikes off edge of step with UE support on standing side.  2x20 each side  Pt required multimodal cuing for proper technique and to facilitate improved neuromuscular control, strength, range of motion, and functional ability resulting in improved performance and form.   PATIENT  EDUCATION:  Education details: Exercise purpose/form. Self management techniques. Person educated: Patient Education method: Explanation, Demonstration, and Handouts Education comprehension: verbalized understanding and needs further education  HOME EXERCISE PROGRAM: Access Code: [email or web address] URL: [email or web address] Date: 01/20/2024 Prepared by: Alleen Isle  Exercises - Supine Extended Bridge on Heels  - 1 x daily - 1 sets - 3 reps - 45 seconds hold - Hip Adductors and Hamstring Stretch with Strap  - 1 x daily - 3 reps - 45 seconds hold - Hip Hikes off step  - 1  x daily - 3 sets - 15 reps - Side Lunge with Counter Support  - 2-3 x daily - 3 sets - 15 reps - Kettlebell Deadlift  - 2-3 x weekly - 3 sets - 10 reps  ASSESSMENT:  CLINICAL IMPRESSION: Patient continues with adductor and hamstring strengthening and stretching. Patient tolerated well and benefited from low back extension stretch after multiple hinge based exercises to relive tension she developed in her back during those exercises. She reported feeling a bit better by end of session. Patient would benefit from continued management of limiting condition by skilled physical therapist to address remaining impairments and functional limitations to work towards stated goals and return to PLOF or maximal functional independence.    OBJECTIVE IMPAIRMENTS: decreased activity tolerance, decreased knowledge of condition, decreased mobility, difficulty walking, and pain.   ACTIVITY LIMITATIONS: lifting, sleeping, and stairs  PARTICIPATION LIMITATIONS: community activity and  difficulty with sleeping, exercising, walking, going up and down stairs,  doing ADLs and IADLs, lifting an object of the floor, performing heavy activities around the home, rolling over in bed  PERSONAL FACTORS: Age, Fitness, Past/current experiences, Time since onset of injury/illness/exacerbation, and 3+ comorbidities:  Diabetes mellitus type 2 with  retinopathy (HCC); OA (osteoarthritis) of neck; History of duodenal ulcer; Seasonal allergies; Hypertension associated with diabetes (HCC); Hyperlipidemia associated with type 2 diabetes mellitus (HCC); Lichen plano-pilaris; Osteoporosis of forearm; History of joint replacement; Vitamin D  deficiency; HSV-1 (herpes simplex virus 1) infection, vaginal; Primary osteoarthritis of right knee; Diverticular disease; Malignant neoplasm of upper-outer quadrant of left breast in female, estrogen receptor positive (HCC); Diabetic retinopathy (HCC); Anemia; Carotid artery aneurysm (HCC); Chemotherapy-induced peripheral neuropathy (HCC); Peripheral edema; Aortic atherosclerosis (HCC); Atherosclerosis of coronary artery of native heart without angina pectoris; Bronchiectasis without complication (HCC); Decreased GFR; Vitamin B 12 deficiency; Leukopenia; Elevated ferritin; Overweight (BMI 25.0-29.9); Burning with urination; Abdominal pain, left lower quadrant; Acute diverticulitis; Chronic cough; Reactive airways dysfunction syndrome (HCC); Acute cough; Hypercalcemia; and Pre-syncope on their problem list. she  has a past medical history of Allergy, Anemia, Aneurysm (HCC), Blood transfusion without reported diagnosis, Cancer (HCC), Coronary artery disease, Diverticulitis, DJD (degenerative joint disease), DM type 2 (diabetes mellitus, type 2) (HCC), F Fibrocystic breast disease, GERD (gastroesophageal reflux disease), History of chicken pox, Hypercholesteremia, Hypertension, Lichen planopilaris, Neuromuscular disorder (HCC), Sleep apnea, Urinary incontinence, and Vertigo (12/2019). she  has a past surgical history that includes Placement of breast implants (12/26/2004); Dilation and curettage of uterus (01/1994); Finger surgery (2001 & 2003); Breast surgery (02/1975); Rhinoplasty (03/1964); Foot surgery (1980); Breast surgery (10/1981); Breast surgery (07/1998); Foot surgery (07/2008); Breast surgery (01/1976); Cholecystectomy  (N/A, 07/08/2016); Joint replacement R THA (04/19/2013);  Mastectomy w/ sentinel node biopsy (Bilateral, 03/08/2019); Breast implant removal (Bilateral, 03/08/2019); Replacement total knee (Left); Cataract extraction w/PHACO (Right, 02/08/2020); Cataract extraction w/PHACO (Left, 02/29/2020); Mastectomy; and Partial knee arthroplasty (Right, 12/31/2022) are also affecting patient's functional outcome.   REHAB POTENTIAL: Good CLINICAL DECISION MAKING: Evolving/moderate complexity EVALUATION COMPLEXITY: Moderate  GOALS: Goals reviewed with patient? No SHORT TERM GOALS: Target date: 12/24/2023  Patient will be independent with initial home exercise program for self-management of symptoms. Baseline: Initial HEP to be provided at visit 2 as appropriate (12/10/23); Goal status: MET  LONG TERM GOALS: Target date: 03/03/2024  Patient will be independent with a long-term home exercise program for self-management of symptoms.  Baseline: Initial HEP to be provided at visit 2 as appropriate (12/10/23); completes exercises approximately half the days since last PT session (01/20/2024);  Goal  status: In progress  2.  Patient will demonstrate improvement in Patient Specific Functional Scale (PSFS) of equal or greater than 8/10 points to reflect clinically significant improvement in patient's most valued functional activities. Baseline: to be measured at visit 2 as appropriate (12/10/23); 4.5/10 (12/15/2023); 9.75/10 (01/20/2024);  Goal status: MET  3.  Patient will demonstrate the ability to go up and down 12 steps without concordant pain to improve her household and community mobility.  Baseline: pain with stairs (12/10/23); no pain with stairs (01/20/2024);  Goal status: MET  4.  Patient will report NPRS equal or less than 3/10 during functional activities during the last 2 weeks to improve their abilitly to complete community, work and/or recreational activities with less limitation. Baseline: 7/10  (12/10/23); 7-8/10 but less frequent, this much pain is not very often (01/20/2024);  Goal status: In progress  PLAN:  PT FREQUENCY: 1-2x/week  PT DURATION: 8-12 weeks  PLANNED INTERVENTIONS: 97164- PT Re-evaluation, 97750- Physical Performance Testing, 97110-Therapeutic exercises, 97530- Therapeutic activity, [id number]- Neuromuscular re-education, 97535- Self Care, 16109- Manual therapy, [id number]- Electrical stimulation (unattended), Patient/Family education, Stair training, Dry Needling, Joint mobilization, Spinal mobilization, Cryotherapy, and Moist heat  PLAN FOR NEXT SESSION: update HEP as appropriate, progressive loading to hip with focus on Rt adductor and hamstring groups as tolerated. Education. Manual therapy as appropriate.   Carilyn Charles. Artemio Larry, PT, DPT 01/26/24, 12:03 PM  [facility] [address] [place], [place] [address] P: [phone] I F: [phone]

## 2024-01-27 ENCOUNTER — Telehealth: Payer: Self-pay | Admitting: Cardiology

## 2024-01-27 MED ORDER — FENOFIBRATE 145 MG PO TABS: 145.0000 mg | ORAL_TABLET | Freq: Every day | ORAL | 3 refills | Status: AC

## 2024-01-27 NOTE — Telephone Encounter (Signed)
*  STAT* If patient is at the pharmacy, call can be transferred to refill team.   1. Which medications need to be refilled? (please list name of each medication and dose if known) fenofibrate  (TRICOR ) 145 MG tablet    2. Would you like to learn more about the convenience, safety, & potential cost savings by using the [facility]?     3. Are you open to using the [facility] (Type [facility].  ).   4. Which pharmacy/location (including street and city if local pharmacy) is medication to be sent to?  [id number] - [place], [place] - [address]     5. Do they need a 30 day or 90 day supply? 90 day

## 2024-01-28 ENCOUNTER — Ambulatory Visit: Admitting: Physical Therapy

## 2024-01-28 ENCOUNTER — Encounter: Payer: Self-pay | Admitting: Physical Therapy

## 2024-01-28 DIAGNOSIS — M25561 Pain in right knee: Secondary | ICD-10-CM | POA: Diagnosis not present

## 2024-01-28 DIAGNOSIS — G8929 Other chronic pain: Secondary | ICD-10-CM | POA: Diagnosis not present

## 2024-01-28 DIAGNOSIS — M79651 Pain in right thigh: Secondary | ICD-10-CM | POA: Diagnosis not present

## 2024-01-28 NOTE — Therapy (Signed)
 OUTPATIENT PHYSICAL THERAPY TREATMENT    Patient Name: Katie [name] MRN: [id number] DOB:30-May-1946, 78 y.o., female Today's Date: 01/28/2024  END OF SESSION:  PT End of Session - 01/28/24 1117     Visit Number 14    Number of Visits 20    Date for PT Re-Evaluation 03/03/24    Authorization Type [employer] MEDIC[employer] reporting period from 12/10/2023    Authorization Time Period auth 20 visits  4/16-6/20  Authorization #[id number]    Authorization - Visit Number 14    Authorization - Number of Visits 20    Progress Note Due on Visit 10    PT Start Time 1115    PT Stop Time 1155    PT Time Calculation (min) 40 min    Activity Tolerance Patient tolerated treatment well;No increased pain    Behavior During Therapy WFL for tasks assessed/performed                  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, small, not treating-watching , it is now stented   Blood transfusion without reported diagnosis    Cancer (HCC)    breast 08/2018   Coronary artery disease    Diverticulitis    DJD (degenerative joint disease)    DM type 2 (diabetes mellitus, type 2) (HCC)    Family history of breast cancer    Fibrocystic breast disease    GERD (gastroesophageal reflux disease)    History of chicken pox    Hypercholesteremia    Hypertension    Lichen planopilaris    Neuromuscular disorder (HCC)    Sleep apnea    cpap   Urinary incontinence    Vertigo 12/2019   1 episode   Past Surgical History:  Procedure Laterality Date   BREAST IMPLANT REMOVAL Bilateral 03/08/2019   Procedure: Removal of Bilateral Breast Implants;  Surgeon: Enid Harry, MD;  Location: [facility] OR;  Service: General;  Laterality: Bilateral;   BREAST SURGERY  02/1975   Left Breast Biopsy-Fibrocystic Disease   BREAST SURGERY  10/1981   Bialteral Capsulectomy Breast Surgery   BREAST SURGERY  07/1998   Replace Bilateral Silicone Implants   BREAST SURGERY   01/1976   Bilater breast surgery to remove fibrocystic tissue and replace with silicone implants   CATARACT EXTRACTION W/PHACO Right 02/08/2020   Procedure: CATARACT EXTRACTION PHACO AND INTRAOCULAR LENS PLACEMENT (IOC) RIGHT DIABETIC;  Surgeon: Clair Crews, MD;  Location: [facility];  Service: Ophthalmology;  Laterality: Right;  3.55 0:33.7   CATARACT EXTRACTION W/PHACO Left 02/29/2020   Procedure: CATARACT EXTRACTION PHACO AND INTRAOCULAR LENS PLACEMENT (IOC) LEFT DIABETIC 2.93  00:27.1;  Surgeon: Clair Crews, MD;  Location: [facility];  Service: Ophthalmology;  Laterality: Left;  Diabetic - oral meds   CHOLECYSTECTOMY N/A 07/08/2016   Procedure: LAPAROSCOPIC CHOLECYSTECTOMY;  Surgeon: Gwyndolyn Lerner, MD;  Location: [facility] ORS;  Service: General;  Laterality: N/A;   COLONOSCOPY WITH PROPOFOL  N/A 11/07/2017   Procedure: COLONOSCOPY WITH PROPOFOL ;  Surgeon: Luke Salaam, MD;  Location: [facility];  Service: Gastroenterology;  Laterality: N/A;   DILATION AND CURETTAGE OF UTERUS  01/1994   ESOPHAGOGASTRODUODENOSCOPY (EGD) WITH PROPOFOL  N/A 12/05/2016   Procedure: ESOPHAGOGASTRODUODENOSCOPY (EGD) WITH PROPOFOL ;  Surgeon: Luke Salaam, MD;  Location: [facility] END[facility];  Service: Endoscopy;  Laterality: N/A;   FINGER SURGERY  2001 & 2003   Trigger Finger   FOOT SURGERY  1980   To Relieve Pinched Nerve   FOOT SURGERY  07/2008   Left Foot-Plantar Fasciitis   JOINT REPLACEMENT  04/19/2013   Hip Replacement-Right   MASTECTOMY     double   MASTECTOMY W/ SENTINEL NODE BIOPSY Bilateral 03/08/2019   Procedure: BILATERAL MASTECTOMIES WITH LEFT AXILLARY SENTINEL LYMPH NODE BIOPSY AND BLUE DYE INJECTION;  Surgeon: Enid Harry, MD;  Location: [facility] OR;  Service: General;  Laterality: Bilateral;   PARTIAL KNEE ARTHROPLASTY Right 12/31/2022   Procedure: RIGHT PARTIAL KNEE ARTHROPLASTY;  Surgeon: Elner Hahn, MD;  Location: [facility];  Service: Orthopedics;  Laterality: Right;    PLACEMENT OF BREAST IMPLANTS  12/26/2004   Replace Failed Implants   REPLACEMENT TOTAL KNEE Left    2019-   RHINOPLASTY  03/1964   To Correct Deviated Septum   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/2023   Acute diverticulitis 03/21/2023   Abdominal pain, left lower quadrant 03/16/2023   Burning with urination 03/07/2023   Overweight (BMI 25.0-29.9) 03/05/2022   Elevated ferritin 03/23/2021   Decreased GFR 02/23/2021   Vitamin B 12 deficiency 02/23/2021   Leukopenia 02/23/2021   Bronchiectasis without complication (HCC) 01/26/2020   Aortic atherosclerosis (HCC) 12/23/2019   Atherosclerosis of coronary artery of native heart without angina pectoris 12/23/2019   Peripheral edema 12/22/2019   Chemotherapy-induced peripheral neuropathy (HCC) 10/29/2019   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   Anemia 10/15/2018   Diabetic retinopathy (HCC) 09/29/2018   Family history of leukemia 09/16/2018   Family history of breast cancer    Malignant neoplasm of upper-outer quadrant of left breast in female, 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/11/2018   Family history of breast cancer in sister 08/28/2018   Diverticular disease 10/17/2017   Primary osteoarthritis of right knee 03/07/2017   HSV-1 (herpes simplex virus 1) infection, 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   Vitamin D  deficiency 10/11/2016   Family history of thyroid  disorder 04/04/2015   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   Counseling regarding end of life decision making 10/04/2014   OA (osteoarthritis) of neck 08/02/2014   History of duodenal ulcer 08/02/2014   Seasonal allergies 08/02/2014   Hypertension associated with diabetes (HCC) 08/02/2014    Hyperlipidemia associated with type 2 diabetes mellitus (HCC) 08/02/2014   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   History of joint replacement 03/01/2014   Diabetes mellitus type 2 with retinopathy (HCC) 11/07/2009   PCP: Judithann Novas, MD REFERRING PROVIDER: Rojelio Clement, PA  REFERRING DIAG: status post right partial knee replacement, hamstring tendinosis of right thigh, strain of right hip adductor muscle  THERAPY DIAG:  Pain in right thigh  Chronic pain of right knee  Rationale for Evaluation and Treatment: Rehabilitation  ONSET DATE: September/October 2024  SUBJECTIVE:   PERTINENT HISTORY: Patient is a 78 y.o. female who presents to outpatient physical therapy with a referral for medical diagnosis status post right partial knee replacement, hamstring tendinosis of right thigh, strain of right hip adductor muscle. This patient's chief complaints consist of pain at the distal medial right thigh, leading to the following functional deficits: difficulty with sleeping, exercising, walking, going up and down stairs,  doing ADLs and IADLs, lifting an object of the floor, performing heavy activities around the home,  rolling over in bed. Relevant past medical history and comorbidities include the following: she has Diabetes mellitus type 2 with retinopathy (HCC); OA (osteoarthritis) of neck; History of duodenal ulcer; Seasonal allergies; Hypertension associated with diabetes (HCC); Hyperlipidemia associated with type 2 diabetes mellitus (HCC); Lichen plano-pilaris; Osteoporosis of forearm; History of joint replacement; Vitamin D  deficiency; HSV-1 (herpes simplex virus 1) infection, vaginal; Primary osteoarthritis of right knee; Diverticular disease; Malignant neoplasm of upper-outer quadrant of left breast in female, estrogen receptor positive (HCC); Diabetic retinopathy (HCC); Anemia; Carotid artery aneurysm (HCC); Chemotherapy-induced peripheral neuropathy (HCC); Peripheral edema; Aortic  atherosclerosis (HCC); Atherosclerosis of coronary artery of native heart without angina pectoris; Bronchiectasis without complication (HCC); Decreased GFR; Vitamin B 12 deficiency; Leukopenia; Elevated ferritin; Overweight (BMI 25.0-29.9); Burning with urination; Abdominal pain, left lower quadrant; Acute diverticulitis; Chronic cough; Reactive airways dysfunction syndrome (HCC); Acute cough; Hypercalcemia; and Pre-syncope on their problem list. she  has a past medical history of Allergy, Anemia, Aneurysm (HCC), Blood transfusion without reported diagnosis, Cancer (HCC), Coronary artery disease, Diverticulitis, DJD (degenerative joint disease), DM type 2 (diabetes mellitus, type 2) (HCC), F Fibrocystic breast disease, GERD (gastroesophageal reflux disease), History of chicken pox, Hypercholesteremia, Hypertension, Lichen planopilaris, Neuromuscular disorder (HCC), Sleep apnea, Urinary incontinence, and Vertigo (12/2019). she  has a past surgical history that includes Placement of breast implants (12/26/2004); Dilation and curettage of uterus (01/1994); Finger surgery (2001 & 2003); Breast surgery (02/1975); Rhinoplasty (03/1964); Foot surgery (1980); Breast surgery (10/1981); Breast surgery (07/1998); Foot surgery (07/2008); Breast surgery (01/1976); Cholecystectomy (N/A, 07/08/2016); Joint replacement R THA (04/19/2013);  Mastectomy w/ sentinel node biopsy (Bilateral, 03/08/2019); Breast implant removal (Bilateral, 03/08/2019); Replacement total knee (Left); Cataract extraction w/PHACO (Right, 02/08/2020); Cataract extraction w/PHACO (Left, 02/29/2020); Mastectomy; and Partial knee arthroplasty (Right, 12/31/2022). Patient denies hx of stroke, seizures, unexplained weight loss, unexplained changes in bowel or bladder problems, unexplained stumbling or dropping things, and spinal surgery. Has osteoporosis in forearm (spine steady), almost 5 years out from breast cancer, has some lung damage from chemo, and has CAD  but not cardiac events .  Exercise history: Usually goes to the gym pretty regularly with her husband. Mostly does the Nustep since her knee surgery. Also has done the treadmill. Avoids the machines if her concordant pain is actively hurting. She is getting a new recumbent bike.   SUBJECTIVE STATEMENT: Patient reports her leg continues to feel better. She did her HEP yesterday but did not get to the gym yet. They have been having a lot of home maintenance done and needed to be home for the technicians to come.   PAIN: NPRS: Current: 1/10 medial side of distal posterior thigh Best: 0/10, Worst: 7-8/10. Pain location: medial side of distal posterior thigh Pain description: sharp then gradually fades, lasts a few minutes.  Aggravating factors: turning over in the bed or lifting her R leg up when she is laying on her back.  Relieving factors: puts voltaren gel on it (unsure if it helps).       FUNCTIONAL LIMITATIONS: difficulty with sleeping, exercising, walking, going up and down stairs,  doing ADLs and IADLs, lifting an object of the floor, performing heavy activities around the home, rolling over in bed.  LEISURE: goes to the gym usually 2-3 times a week, traveling, exercising, going to the movies, pretty active for age. She has a set of 2# and 3# DB at home, recumbent bike.    PRECAUTIONS: None, used to be not allowed to lift anything over 30# due to  aneurism that was repaired  WEIGHT BEARING RESTRICTIONS: No  FALLS:  Has patient fallen in last 6 months? 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   NEXT MD VISIT:   OBJECTIVE                                                                                                                             TREATMENT Therapeutic exercise: therapeutic exercises that incorporate ONE parameter at one or more areas of  the body to centralize symptoms, develop strength and endurance, range of motion, and flexibility required for successful completion of functional activities.   NuStep using bilateral upper and lower extremities. For improved extremity mobility, muscular endurance, and activity tolerance; and to induce the analgesic effect of aerobic exercise, stimulate improved joint nutrition, and prepare body structures and systems for following interventions. Also to reinforce understanding of appropriate exercise intensity to help meet physical activity guidelines for health.  Seat/handle setting: 6/8 5:48  minutes Level: 4 Target SPM: > 90 Average SPM: 83 RPE: 8/10  Standing lumbar extension with hands on pelvis 1x15 1x12  Standing hip adductor squeeze into Pilates ring placed at ankles with B UE support 3x15 each side  Seated hip adductor stretch in modified pancake position 3x1 minute  Seated on 8 inch step leaning forwards to 6 inch yoga block Min assistance from PT with getting up from stool.   Therapeutic activities: dynamic therapeutic activities incorporating MULTIPLE parameters or areas of the body designed to achieve improved functional performance.  Standing RDL with KB yoga block to max ROM with braced back 3x15 with 20#KB Cuing for flat back and scapular retraction with lat activation Better carry over from last visit  Standing lateral lunge with U UE support 3x15 each side with 10#DB in contralateral UE  Working on bigger hip hinge   Lateral stepping with [employer] under feet 3x20 steps each direction  Pt required multimodal cuing for proper technique and to facilitate improved neuromuscular control, strength, range of motion, and functional ability resulting in improved performance and form.   PATIENT EDUCATION:  Education details: Exercise purpose/form. Self management techniques. Person educated: Patient Education method: Explanation, Demonstration, and  Handouts Education comprehension: verbalized understanding and needs further education  HOME EXERCISE PROGRAM: Access Code: [email or web address] URL: [email or web address] Date: 01/20/2024 Prepared by: Alleen Isle  Exercises - Supine Extended Bridge on Heels  - 1 x daily - 1 sets - 3 reps - 45 seconds hold - Hip Adductors and Hamstring Stretch with Strap  - 1 x daily - 3 reps - 45 seconds hold - Hip Hikes off step  - 1 x daily - 3 sets - 15 reps - Side Lunge with Counter Support  - 2-3 x daily - 3 sets - 15 reps - Kettlebell Deadlift  - 2-3 x weekly - 3 sets - 10 reps  ASSESSMENT:  CLINICAL IMPRESSION: Patient continues to have improvement in her leg pain. Focus of today's session continued to be on progressive strengthening and loading of hip and thigh muscles. Patient tolerated treatment well and found exercises appropriately challenging. Patient would benefit from continued management of limiting condition by skilled physical therapist to address remaining impairments and functional limitations to work towards stated goals and return to PLOF or maximal functional independence.    OBJECTIVE IMPAIRMENTS: decreased activity tolerance, decreased knowledge of condition, decreased mobility, difficulty walking, and pain.   ACTIVITY LIMITATIONS: lifting, sleeping, and stairs  PARTICIPATION LIMITATIONS: community activity and  difficulty with sleeping, exercising, walking, going up and down stairs,  doing ADLs and IADLs, lifting an object of the floor, performing heavy activities around the home, rolling over in bed  PERSONAL FACTORS: Age, Fitness, Past/current experiences, Time since onset of injury/illness/exacerbation, and 3+ comorbidities:  Diabetes mellitus type 2 with retinopathy (HCC); OA (osteoarthritis) of neck; History of duodenal ulcer; Seasonal allergies; Hypertension associated with diabetes (HCC); Hyperlipidemia associated with type 2 diabetes mellitus (HCC); Lichen  plano-pilaris; Osteoporosis of forearm; History of joint replacement; Vitamin D  deficiency; HSV-1 (herpes simplex virus 1) infection, vaginal; Primary osteoarthritis of right knee; Diverticular disease; Malignant neoplasm of upper-outer quadrant of left breast in female, estrogen receptor positive (HCC); Diabetic retinopathy (HCC); Anemia; Carotid artery aneurysm (HCC); Chemotherapy-induced peripheral neuropathy (HCC); Peripheral edema; Aortic atherosclerosis (HCC); Atherosclerosis of coronary artery of native heart without angina pectoris; Bronchiectasis without complication (HCC); Decreased GFR; Vitamin B 12 deficiency; Leukopenia; Elevated ferritin; Overweight (BMI 25.0-29.9); Burning with urination; Abdominal pain, left lower quadrant; Acute diverticulitis; Chronic cough; Reactive airways dysfunction syndrome (HCC); Acute cough; Hypercalcemia; and Pre-syncope on their problem list. she  has a past medical history of Allergy, Anemia, Aneurysm (HCC), Blood transfusion without reported diagnosis, Cancer (HCC), Coronary artery disease, Diverticulitis, DJD (degenerative joint disease), DM type 2 (diabetes mellitus, type 2) (HCC), F Fibrocystic breast disease, GERD (gastroesophageal reflux disease), History of chicken pox, Hypercholesteremia, Hypertension, Lichen planopilaris, Neuromuscular disorder (HCC), Sleep apnea, Urinary incontinence, and Vertigo (12/2019). she  has a past surgical history that includes Placement of breast implants (12/26/2004); Dilation and curettage of uterus (01/1994); Finger surgery (2001 & 2003); Breast surgery (02/1975); Rhinoplasty (03/1964); Foot surgery (1980); Breast surgery (10/1981); Breast surgery (07/1998); Foot surgery (07/2008); Breast surgery (01/1976); Cholecystectomy (N/A, 07/08/2016); Joint replacement R THA (04/19/2013);  Mastectomy w/ sentinel node biopsy (Bilateral, 03/08/2019); Breast implant removal (Bilateral, 03/08/2019); Replacement total knee (Left); Cataract  extraction w/PHACO (Right, 02/08/2020); Cataract extraction w/PHACO (Left, 02/29/2020); Mastectomy; and Partial knee arthroplasty (Right, 12/31/2022) are also affecting patient's functional outcome.   REHAB POTENTIAL: Good CLINICAL DECISION MAKING: Evolving/moderate complexity EVALUATION COMPLEXITY: Moderate  GOALS: Goals reviewed with patient? No SHORT TERM GOALS: Target date: 12/24/2023  Patient will be independent with initial home exercise program for self-management of symptoms. Baseline: Initial HEP to be provided at visit 2 as appropriate (12/10/23); Goal status: MET  LONG TERM GOALS: Target date: 03/03/2024  Patient will be independent with a long-term home exercise program for self-management of symptoms.  Baseline: Initial HEP to be provided at visit 2 as appropriate (12/10/23); completes exercises approximately half the days since last PT session (01/20/2024);  Goal status: In progress  2.  Patient will demonstrate improvement in Patient Specific Functional Scale (PSFS) of equal or greater than 8/10 points to reflect clinically significant improvement in patient's most valued functional activities. Baseline: to be measured at visit 2 as appropriate (12/10/23); 4.5/10 (12/15/2023); 9.75/10 (01/20/2024);  Goal  status: MET  3.  Patient will demonstrate the ability to go up and down 12 steps without concordant pain to improve her household and community mobility.  Baseline: pain with stairs (12/10/23); no pain with stairs (01/20/2024);  Goal status: MET  4.  Patient will report NPRS equal or less than 3/10 during functional activities during the last 2 weeks to improve their abilitly to complete community, work and/or recreational activities with less limitation. Baseline: 7/10 (12/10/23); 7-8/10 but less frequent, this much pain is not very often (01/20/2024);  Goal status: In progress  PLAN:  PT FREQUENCY: 1-2x/week  PT DURATION: 8-12 weeks  PLANNED INTERVENTIONS: [id number]- PT  Re-evaluation, 97750- Physical Performance Testing, 97110-Therapeutic exercises, 97530- Therapeutic activity, [id number]- Neuromuscular re-education, 97535- Self Care, 16109- Manual therapy, [id number]- Electrical stimulation (unattended), Patient/Family education, Stair training, Dry Needling, Joint mobilization, Spinal mobilization, Cryotherapy, and Moist heat  PLAN FOR NEXT SESSION: update HEP as appropriate, progressive loading to hip with focus on Rt adductor and hamstring groups as tolerated. Education. Manual therapy as appropriate.   Carilyn Charles. Artemio Larry, PT, DPT 01/28/24, 11:56 AM  [facility] [address] [place], [place] [address] P: [phone] I F: [phone]

## 2024-02-02 ENCOUNTER — Encounter: Payer: Self-pay | Admitting: Physical Therapy

## 2024-02-02 ENCOUNTER — Ambulatory Visit: Admitting: Physical Therapy

## 2024-02-02 DIAGNOSIS — G8929 Other chronic pain: Secondary | ICD-10-CM

## 2024-02-02 DIAGNOSIS — M79651 Pain in right thigh: Secondary | ICD-10-CM

## 2024-02-02 DIAGNOSIS — M25561 Pain in right knee: Secondary | ICD-10-CM | POA: Diagnosis not present

## 2024-02-02 NOTE — Therapy (Signed)
 OUTPATIENT PHYSICAL THERAPY TREATMENT    Patient Name: Katie [name] MRN: [id number] DOB:[birth year], 78 y.o., female Today's Date: 02/02/2024  END OF SESSION:  PT End of Session - 02/02/24 1437     Visit Number 15    Number of Visits 20    Date for PT Re-Evaluation 03/03/24    Authorization Type [employer] MEDIC[employer] reporting period from 12/10/2023    Authorization Time Period auth 20 visits  4/16-6/20  Authorization #[id number]    Authorization - Visit Number 15    Authorization - Number of Visits 20    Progress Note Due on Visit 10    PT Start Time 1433    PT Stop Time 1513    PT Time Calculation (min) 40 min    Activity Tolerance Patient tolerated treatment well;No increased pain    Behavior During Therapy WFL for tasks assessed/performed                   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, small, not treating-watching , it is now stented   Blood transfusion without reported diagnosis    Cancer (HCC)    breast 08/2018   Coronary artery disease    Diverticulitis    DJD (degenerative joint disease)    DM type 2 (diabetes mellitus, type 2) (HCC)    Family history of breast cancer    Fibrocystic breast disease    GERD (gastroesophageal reflux disease)    History of chicken pox    Hypercholesteremia    Hypertension    Lichen planopilaris    Neuromuscular disorder (HCC)    Sleep apnea    cpap   Urinary incontinence    Vertigo 12/2019   1 episode   Past Surgical History:  Procedure Laterality Date   BREAST IMPLANT REMOVAL Bilateral 03/08/2019   Procedure: Removal of Bilateral Breast Implants;  Surgeon: Enid Harry, MD;  Location: [facility] OR;  Service: General;  Laterality: Bilateral;   BREAST SURGERY  02/1975   Left Breast Biopsy-Fibrocystic Disease   BREAST SURGERY  10/1981   Bialteral Capsulectomy Breast Surgery   BREAST SURGERY  07/1998   Replace Bilateral Silicone Implants   BREAST SURGERY   01/1976   Bilater breast surgery to remove fibrocystic tissue and replace with silicone implants   CATARACT EXTRACTION W/PHACO Right 02/08/2020   Procedure: CATARACT EXTRACTION PHACO AND INTRAOCULAR LENS PLACEMENT (IOC) RIGHT DIABETIC;  Surgeon: Clair Crews, MD;  Location: [facility];  Service: Ophthalmology;  Laterality: Right;  3.55 0:33.7   CATARACT EXTRACTION W/PHACO Left 02/29/2020   Procedure: CATARACT EXTRACTION PHACO AND INTRAOCULAR LENS PLACEMENT (IOC) LEFT DIABETIC 2.93  00:27.1;  Surgeon: Clair Crews, MD;  Location: [facility];  Service: Ophthalmology;  Laterality: Left;  Diabetic - oral meds   CHOLECYSTECTOMY N/A 07/08/2016   Procedure: LAPAROSCOPIC CHOLECYSTECTOMY;  Surgeon: Gwyndolyn Lerner, MD;  Location: [facility] ORS;  Service: General;  Laterality: N/A;   COLONOSCOPY WITH PROPOFOL  N/A 11/07/2017   Procedure: COLONOSCOPY WITH PROPOFOL ;  Surgeon: Luke Salaam, MD;  Location: [facility] ENDOSCOPY;  Service: Gastroenterology;  Laterality: N/A;   DILATION AND CURETTAGE OF UTERUS  01/1994   ESOPHAGOGASTRODUODENOSCOPY (EGD) WITH PROPOFOL  N/A 12/05/2016   Procedure: ESOPHAGOGASTRODUODENOSCOPY (EGD) WITH PROPOFOL ;  Surgeon: Luke Salaam, MD;  Location: [facility] ENDOSCOPY;  Service: Endoscopy;  Laterality: N/A;   FINGER SURGERY  2001 & 2003   Trigger Finger   FOOT SURGERY  1980   To Relieve Pinched Nerve   FOOT SURGERY  07/2008   Left Foot-Plantar Fasciitis   JOINT REPLACEMENT  04/19/2013   Hip Replacement-Right   MASTECTOMY     double   MASTECTOMY W/ SENTINEL NODE BIOPSY Bilateral 03/08/2019   Procedure: BILATERAL MASTECTOMIES WITH LEFT AXILLARY SENTINEL LYMPH NODE BIOPSY AND BLUE DYE INJECTION;  Surgeon: Enid Harry, MD;  Location: [facility] OR;  Service: General;  Laterality: Bilateral;   PARTIAL KNEE ARTHROPLASTY Right 12/31/2022   Procedure: RIGHT PARTIAL KNEE ARTHROPLASTY;  Surgeon: Elner Hahn, MD;  Location: [facility] ORS;  Service: Orthopedics;  Laterality: Right;    PLACEMENT OF BREAST IMPLANTS  12/26/2004   Replace Failed Implants   REPLACEMENT TOTAL KNEE Left    2019-   RHINOPLASTY  03/1964   To Correct Deviated Septum   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/2023   Acute diverticulitis 03/21/2023   Abdominal pain, left lower quadrant 03/16/2023   Burning with urination 03/07/2023   Overweight (BMI 25.0-29.9) 03/05/2022   Elevated ferritin 03/23/2021   Decreased GFR 02/23/2021   Vitamin B 12 deficiency 02/23/2021   Leukopenia 02/23/2021   Bronchiectasis without complication (HCC) 01/26/2020   Aortic atherosclerosis (HCC) 12/23/2019   Atherosclerosis of coronary artery of native heart without angina pectoris 12/23/2019   Peripheral edema 12/22/2019   Chemotherapy-induced peripheral neuropathy (HCC) 10/29/2019   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   Anemia 10/15/2018   Diabetic retinopathy (HCC) 09/29/2018   Family history of leukemia 09/16/2018   Family history of breast cancer    Malignant neoplasm of upper-outer quadrant of left breast in female, 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/11/2018   Family history of breast cancer in sister 08/28/2018   Diverticular disease 10/17/2017   Primary osteoarthritis of right knee 03/07/2017   HSV-1 (herpes simplex virus 1) infection, 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   Vitamin D  deficiency 10/11/2016   Family history of thyroid  disorder 04/04/2015   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   Counseling regarding end of life decision making 10/04/2014   OA (osteoarthritis) of neck 08/02/2014   History of duodenal ulcer 08/02/2014   Seasonal allergies 08/02/2014   Hypertension associated with diabetes (HCC) 08/02/2014    Hyperlipidemia associated with type 2 diabetes mellitus (HCC) 08/02/2014   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   History of joint replacement 03/01/2014   Diabetes mellitus type 2 with retinopathy (HCC) 11/07/2009   PCP: Judithann Novas, MD REFERRING PROVIDER: Rojelio Clement, PA  REFERRING DIAG: status post right partial knee replacement, hamstring tendinosis of right thigh, strain of right hip adductor muscle  THERAPY DIAG:  Pain in right thigh  Chronic pain of right knee  Rationale for Evaluation and Treatment: Rehabilitation  ONSET DATE: September/October 2024  SUBJECTIVE:   PERTINENT HISTORY: Patient is a 78 y.o. female who presents to outpatient physical therapy with a referral for medical diagnosis status post right partial knee replacement, hamstring tendinosis of right thigh, strain of right hip adductor muscle. This patient's chief complaints consist of pain at the distal medial right thigh, leading to the following functional deficits: difficulty with sleeping, exercising, walking, going up and down stairs,  doing ADLs and IADLs, lifting an object of the floor, performing heavy activities around the home,  rolling over in bed. Relevant past medical history and comorbidities include the following: she has Diabetes mellitus type 2 with retinopathy (HCC); OA (osteoarthritis) of neck; History of duodenal ulcer; Seasonal allergies; Hypertension associated with diabetes (HCC); Hyperlipidemia associated with type 2 diabetes mellitus (HCC); Lichen plano-pilaris; Osteoporosis of forearm; History of joint replacement; Vitamin D  deficiency; HSV-1 (herpes simplex virus 1) infection, vaginal; Primary osteoarthritis of right knee; Diverticular disease; Malignant neoplasm of upper-outer quadrant of left breast in female, estrogen receptor positive (HCC); Diabetic retinopathy (HCC); Anemia; Carotid artery aneurysm (HCC); Chemotherapy-induced peripheral neuropathy (HCC); Peripheral edema; Aortic  atherosclerosis (HCC); Atherosclerosis of coronary artery of native heart without angina pectoris; Bronchiectasis without complication (HCC); Decreased GFR; Vitamin B 12 deficiency; Leukopenia; Elevated ferritin; Overweight (BMI 25.0-29.9); Burning with urination; Abdominal pain, left lower quadrant; Acute diverticulitis; Chronic cough; Reactive airways dysfunction syndrome (HCC); Acute cough; Hypercalcemia; and Pre-syncope on their problem list. she  has a past medical history of Allergy, Anemia, Aneurysm (HCC), Blood transfusion without reported diagnosis, Cancer (HCC), Coronary artery disease, Diverticulitis, DJD (degenerative joint disease), DM type 2 (diabetes mellitus, type 2) (HCC), F Fibrocystic breast disease, GERD (gastroesophageal reflux disease), History of chicken pox, Hypercholesteremia, Hypertension, Lichen planopilaris, Neuromuscular disorder (HCC), Sleep apnea, Urinary incontinence, and Vertigo (12/2019). she  has a past surgical history that includes Placement of breast implants (12/26/2004); Dilation and curettage of uterus (01/1994); Finger surgery (2001 & 2003); Breast surgery (02/1975); Rhinoplasty (03/1964); Foot surgery (1980); Breast surgery (10/1981); Breast surgery (07/1998); Foot surgery (07/2008); Breast surgery (01/1976); Cholecystectomy (N/A, 07/08/2016); Joint replacement R THA (04/19/2013);  Mastectomy w/ sentinel node biopsy (Bilateral, 03/08/2019); Breast implant removal (Bilateral, 03/08/2019); Replacement total knee (Left); Cataract extraction w/PHACO (Right, 02/08/2020); Cataract extraction w/PHACO (Left, 02/29/2020); Mastectomy; and Partial knee arthroplasty (Right, 12/31/2022). Patient denies hx of stroke, seizures, unexplained weight loss, unexplained changes in bowel or bladder problems, unexplained stumbling or dropping things, and spinal surgery. Has osteoporosis in forearm (spine steady), almost 5 years out from breast cancer, has some lung damage from chemo, and has CAD  but not cardiac events .  Exercise history: Usually goes to the gym pretty regularly with her husband. Mostly does the Nustep since her knee surgery. Also has done the treadmill. Avoids the machines if her concordant pain is actively hurting. She is getting a new recumbent bike.   SUBJECTIVE STATEMENT: Patient states she hopes she has not undone everything she has done in PT. Her leg was really bothered (as was her left hip) after prolonged sitting in the back seat of her vehicle and a lot of standing when going to her granddaughter's graduation. She also did not get to the gym over the weekend because of being gone at the graduation.   PAIN: NPRS: Current: 4/10 medial side of distal posterior thigh, left hip Best: 0/10, Worst: 7-8/10. Pain location: medial side of distal posterior thigh Pain description: sharp then gradually fades, lasts a few minutes.  Aggravating factors: turning over in the bed or lifting her R leg up when she is laying on her back.  Relieving factors: puts voltaren gel on it (unsure if it helps).       FUNCTIONAL LIMITATIONS: difficulty with sleeping, exercising, walking, going up and down stairs,  doing ADLs and IADLs, lifting an object of the floor, performing heavy activities around the home, rolling over in bed.  LEISURE: goes to the gym usually 2-3 times a week, traveling, exercising, going to the movies, pretty active for age. She has a set of 2#  and 3# DB at home, recumbent bike.    PRECAUTIONS: None, used to be not allowed to lift anything over 30# due to aneurism that was repaired  WEIGHT BEARING RESTRICTIONS: No  FALLS:  Has patient fallen in last 6 months? 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   NEXT MD VISIT:   OBJECTIVE                                                                                                                              TREATMENT Therapeutic exercise: therapeutic exercises that incorporate ONE parameter at one or more areas of the body to centralize symptoms, develop strength and endurance, range of motion, and flexibility required for successful completion of functional activities.   NuStep using bilateral upper and lower extremities. For improved extremity mobility, muscular endurance, and activity tolerance; and to induce the analgesic effect of aerobic exercise, stimulate improved joint nutrition, and prepare body structures and systems for following interventions. Also to reinforce understanding of appropriate exercise intensity to help meet physical activity guidelines for health.  Seat/handle setting: 6/8 5  minutes Level: 4 Target SPM: > 90 Average SPM: 86 RPE: 8/10  Standing lumbar extension with hands on pelvis 2x10  Standing hip adductor squeeze into Pilates ring placed at ankles with B UE support 3x15 each side  Seated hip adductor stretch in modified pancake position 3x1 minute  Seated on 8 inch step leaning forwards to 6 inch yoga block Min assistance from PT with getting up from stool.   Therapeutic activities: dynamic therapeutic activities incorporating MULTIPLE parameters or areas of the body designed to achieve improved functional performance.  Lateral stepping with BlackTheratube under feet and crossed in front 3x20 steps each direction Cuing to lead with heels Difficulty keeping toes forwards Feels strongly in L lateral hip  Standing RDL with KB yoga block to max ROM with braced back 3x15 with 20#KB Cuing for flat back and scapular retraction with lat activation  Standing lateral lunge with U UE support 3x15 each side with 10#DB in contralateral UE  Working on bigger hip hinge   Pt required multimodal cuing for proper technique and to facilitate improved neuromuscular control, strength, range of motion, and functional ability resulting in improved  performance and form.   PATIENT EDUCATION:  Education details: Exercise purpose/form. Self management techniques. Person educated: Patient Education method: Explanation, Demonstration, and Handouts Education comprehension: verbalized understanding and needs further education  HOME EXERCISE PROGRAM: Access Code: [email or web address] URL: [email or web address] Date: 01/20/2024 Prepared by: Alleen Isle  Exercises - Supine Extended Bridge on Heels  - 1 x daily - 1 sets - 3 reps - 45 seconds hold - Hip Adductors and Hamstring Stretch with Strap  - 1 x daily - 3 reps - 45 seconds hold - Hip Hikes off step  - 1 x daily -  3 sets - 15 reps - Side Lunge with Counter Support  - 2-3 x daily - 3 sets - 15 reps - Kettlebell Deadlift  - 2-3 x weekly - 3 sets - 10 reps  ASSESSMENT:  CLINICAL IMPRESSION: Patient with increased pain after abnormal weekend activities that involved a lot of confined sitting and standing. Continued with exercises from last PT with patient feeling better by end of session but having increased discomfort at L hip with side stepping with theratube. Patient would benefit from continued management of limiting condition by skilled physical therapist to address remaining impairments and functional limitations to work towards stated goals and return to PLOF or maximal functional independence.   OBJECTIVE IMPAIRMENTS: decreased activity tolerance, decreased knowledge of condition, decreased mobility, difficulty walking, and pain.   ACTIVITY LIMITATIONS: lifting, sleeping, and stairs  PARTICIPATION LIMITATIONS: community activity and  difficulty with sleeping, exercising, walking, going up and down stairs,  doing ADLs and IADLs, lifting an object of the floor, performing heavy activities around the home, rolling over in bed  PERSONAL FACTORS: Age, Fitness, Past/current experiences, Time since onset of injury/illness/exacerbation, and 3+ comorbidities:  Diabetes mellitus type 2  with retinopathy (HCC); OA (osteoarthritis) of neck; History of duodenal ulcer; Seasonal allergies; Hypertension associated with diabetes (HCC); Hyperlipidemia associated with type 2 diabetes mellitus (HCC); Lichen plano-pilaris; Osteoporosis of forearm; History of joint replacement; Vitamin D  deficiency; HSV-1 (herpes simplex virus 1) infection, vaginal; Primary osteoarthritis of right knee; Diverticular disease; Malignant neoplasm of upper-outer quadrant of left breast in female, estrogen receptor positive (HCC); Diabetic retinopathy (HCC); Anemia; Carotid artery aneurysm (HCC); Chemotherapy-induced peripheral neuropathy (HCC); Peripheral edema; Aortic atherosclerosis (HCC); Atherosclerosis of coronary artery of native heart without angina pectoris; Bronchiectasis without complication (HCC); Decreased GFR; Vitamin B 12 deficiency; Leukopenia; Elevated ferritin; Overweight (BMI 25.0-29.9); Burning with urination; Abdominal pain, left lower quadrant; Acute diverticulitis; Chronic cough; Reactive airways dysfunction syndrome (HCC); Acute cough; Hypercalcemia; and Pre-syncope on their problem list. she  has a past medical history of Allergy, Anemia, Aneurysm (HCC), Blood transfusion without reported diagnosis, Cancer (HCC), Coronary artery disease, Diverticulitis, DJD (degenerative joint disease), DM type 2 (diabetes mellitus, type 2) (HCC), F Fibrocystic breast disease, GERD (gastroesophageal reflux disease), History of chicken pox, Hypercholesteremia, Hypertension, Lichen planopilaris, Neuromuscular disorder (HCC), Sleep apnea, Urinary incontinence, and Vertigo (12/2019). she  has a past surgical history that includes Placement of breast implants (12/26/2004); Dilation and curettage of uterus (01/1994); Finger surgery (2001 & 2003); Breast surgery (02/1975); Rhinoplasty (03/1964); Foot surgery (1980); Breast surgery (10/1981); Breast surgery (07/1998); Foot surgery (07/2008); Breast surgery (01/1976);  Cholecystectomy (N/A, 07/08/2016); Joint replacement R THA (04/19/2013);  Mastectomy w/ sentinel node biopsy (Bilateral, 03/08/2019); Breast implant removal (Bilateral, 03/08/2019); Replacement total knee (Left); Cataract extraction w/PHACO (Right, 02/08/2020); Cataract extraction w/PHACO (Left, 02/29/2020); Mastectomy; and Partial knee arthroplasty (Right, 12/31/2022) are also affecting patient's functional outcome.   REHAB POTENTIAL: Good CLINICAL DECISION MAKING: Evolving/moderate complexity EVALUATION COMPLEXITY: Moderate  GOALS: Goals reviewed with patient? No SHORT TERM GOALS: Target date: 12/24/2023  Patient will be independent with initial home exercise program for self-management of symptoms. Baseline: Initial HEP to be provided at visit 2 as appropriate (12/10/23); Goal status: MET  LONG TERM GOALS: Target date: 03/03/2024  Patient will be independent with a long-term home exercise program for self-management of symptoms.  Baseline: Initial HEP to be provided at visit 2 as appropriate (12/10/23); completes exercises approximately half the days since last PT session (01/20/2024);  Goal status: In progress  2.  Patient will demonstrate improvement in Patient Specific Functional Scale (PSFS) of equal or greater than 8/10 points to reflect clinically significant improvement in patient's most valued functional activities. Baseline: to be measured at visit 2 as appropriate (12/10/23); 4.5/10 (12/15/2023); 9.75/10 (01/20/2024);  Goal status: MET  3.  Patient will demonstrate the ability to go up and down 12 steps without concordant pain to improve her household and community mobility.  Baseline: pain with stairs (12/10/23); no pain with stairs (01/20/2024);  Goal status: MET  4.  Patient will report NPRS equal or less than 3/10 during functional activities during the last 2 weeks to improve their abilitly to complete community, work and/or recreational activities with less  limitation. Baseline: 7/10 (12/10/23); 7-8/10 but less frequent, this much pain is not very often (01/20/2024);  Goal status: In progress  PLAN:  PT FREQUENCY: 1-2x/week  PT DURATION: 8-12 weeks  PLANNED INTERVENTIONS: 97164- PT Re-evaluation, 97750- Physical Performance Testing, 97110-Therapeutic exercises, 97530- Therapeutic activity, [id number]- Neuromuscular re-education, 97535- Self Care, 29562- Manual therapy, [id number]- Electrical stimulation (unattended), Patient/Family education, Stair training, Dry Needling, Joint mobilization, Spinal mobilization, Cryotherapy, and Moist heat  PLAN FOR NEXT SESSION: update HEP as appropriate, progressive loading to hip with focus on Rt adductor and hamstring groups as tolerated. Education. Manual therapy as appropriate.   Carilyn Charles. Artemio Larry, PT, DPT 02/02/24, 3:15 PM  [facility] [address] [place], [place] [address] P: [phone] I F: [phone]

## 2024-02-04 ENCOUNTER — Ambulatory Visit: Admitting: Physical Therapy

## 2024-02-04 ENCOUNTER — Encounter: Payer: Self-pay | Admitting: Physical Therapy

## 2024-02-04 DIAGNOSIS — M79651 Pain in right thigh: Secondary | ICD-10-CM

## 2024-02-04 DIAGNOSIS — G8929 Other chronic pain: Secondary | ICD-10-CM

## 2024-02-04 DIAGNOSIS — M25561 Pain in right knee: Secondary | ICD-10-CM | POA: Diagnosis not present

## 2024-02-04 NOTE — Therapy (Signed)
 OUTPATIENT PHYSICAL THERAPY TREATMENT    Patient Name: Katie [name] MRN: [id number] DOB:Feb 02, 1946, 78 y.o., female Today's Date: 02/04/2024  END OF SESSION:  PT End of Session - 02/04/24 1444     Visit Number 16    Number of Visits 20    Date for PT Re-Evaluation 03/03/24    Authorization Type [employer]IC[employer] reporting period from 12/10/2023    Authorization Time Period auth 20 visits  4/16-6/20  Authorization #[id number]    Authorization - Visit Number 16    Authorization - Number of Visits 20    Progress Note Due on Visit 20    PT Start Time 1441    PT Stop Time 1519    PT Time Calculation (min) 38 min    Activity Tolerance Patient tolerated treatment well;No increased pain    Behavior During Therapy WFL for tasks assessed/performed                    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, small, not treating-watching , it is now stented   Blood transfusion without reported diagnosis    Cancer (HCC)    breast 08/2018   Coronary artery disease    Diverticulitis    DJD (degenerative joint disease)    DM type 2 (diabetes mellitus, type 2) (HCC)    Family history of breast cancer    Fibrocystic breast disease    GERD (gastroesophageal reflux disease)    History of chicken pox    Hypercholesteremia    Hypertension    Lichen planopilaris    Neuromuscular disorder (HCC)    Sleep apnea    cpap   Urinary incontinence    Vertigo 12/2019   1 episode   Past Surgical History:  Procedure Laterality Date   BREAST IMPLANT REMOVAL Bilateral 03/08/2019   Procedure: Removal of Bilateral Breast Implants;  Surgeon: Enid Harry, MD;  Location: [facility] OR;  Service: General;  Laterality: Bilateral;   BREAST SURGERY  02/1975   Left Breast Biopsy-Fibrocystic Disease   BREAST SURGERY  10/1981   Bialteral Capsulectomy Breast Surgery   BREAST SURGERY  07/1998   Replace Bilateral Silicone Implants   BREAST SURGERY   01/1976   Bilater breast surgery to remove fibrocystic tissue and replace with silicone implants   CATARACT EXTRACTION W/PHACO Right 02/08/2020   Procedure: CATARACT EXTRACTION PHACO AND INTRAOCULAR LENS PLACEMENT (IOC) RIGHT DIABETIC;  Surgeon: Clair Crews, MD;  Location: [facility];  Service: Ophthalmology;  Laterality: Right;  3.55 0:33.7   CATARACT EXTRACTION W/PHACO Left 02/29/2020   Procedure: CATARACT EXTRACTION PHACO AND INTRAOCULAR LENS PLACEMENT (IOC) LEFT DIABETIC 2.93  00:27.1;  Surgeon: Clair Crews, MD;  Location: [facility];  Service: Ophthalmology;  Laterality: Left;  Diabetic - oral meds   CHOLECYSTECTOMY N/A 07/08/2016   Procedure: LAPAROSCOPIC CHOLECYSTECTOMY;  Surgeon: Gwyndolyn Lerner, MD;  Location: [facility] ORS;  Service: General;  Laterality: N/A;   COLONOSCOPY WITH PROPOFOL  N/A 11/07/2017   Procedure: COLONOSCOPY WITH PROPOFOL ;  Surgeon: Luke Salaam, MD;  Location: [facility] ENDOSCOPY;  Service: Gastroenterology;  Laterality: N/A;   DILATION AND CURETTAGE OF UTERUS  01/1994   ESOPHAGOGASTRODUODENOSCOPY (EGD) WITH PROPOFOL  N/A 12/05/2016   Procedure: ESOPHAGOGASTRODUODENOSCOPY (EGD) WITH PROPOFOL ;  Surgeon: Luke Salaam, MD;  Location: [facility] ENDOSCOPY;  Service: Endoscopy;  Laterality: N/A;   FINGER SURGERY  2001 & 2003   Trigger Finger   FOOT  SURGERY  1980   To Relieve Pinched Nerve   FOOT SURGERY  07/2008   Left Foot-Plantar Fasciitis   JOINT REPLACEMENT  04/19/2013   Hip Replacement-Right   MASTECTOMY     double   MASTECTOMY W/ SENTINEL NODE BIOPSY Bilateral 03/08/2019   Procedure: BILATERAL MASTECTOMIES WITH LEFT AXILLARY SENTINEL LYMPH NODE BIOPSY AND BLUE DYE INJECTION;  Surgeon: Enid Harry, MD;  Location: [facility] OR;  Service: General;  Laterality: Bilateral;   PARTIAL KNEE ARTHROPLASTY Right 12/31/2022   Procedure: RIGHT PARTIAL KNEE ARTHROPLASTY;  Surgeon: Elner Hahn, MD;  Location: [facility] ORS;  Service: Orthopedics;  Laterality: Right;    PLACEMENT OF BREAST IMPLANTS  12/26/2004   Replace Failed Implants   REPLACEMENT TOTAL KNEE Left    2019-   RHINOPLASTY  03/1964   To Correct Deviated Septum   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/2023   Acute diverticulitis 03/21/2023   Abdominal pain, left lower quadrant 03/16/2023   Burning with urination 03/07/2023   Overweight (BMI 25.0-29.9) 03/05/2022   Elevated ferritin 03/23/2021   Decreased GFR 02/23/2021   Vitamin B 12 deficiency 02/23/2021   Leukopenia 02/23/2021   Bronchiectasis without complication (HCC) 01/26/2020   Aortic atherosclerosis (HCC) 12/23/2019   Atherosclerosis of coronary artery of native heart without angina pectoris 12/23/2019   Peripheral edema 12/22/2019   Chemotherapy-induced peripheral neuropathy (HCC) 10/29/2019   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   Anemia 10/15/2018   Diabetic retinopathy (HCC) 09/29/2018   Family history of leukemia 09/16/2018   Family history of breast cancer    Malignant neoplasm of upper-outer quadrant of left breast in female, 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/11/2018   Family history of breast cancer in sister 08/28/2018   Diverticular disease 10/17/2017   Primary osteoarthritis of right knee 03/07/2017   HSV-1 (herpes simplex virus 1) infection, 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   Vitamin D  deficiency 10/11/2016   Family history of thyroid  disorder 04/04/2015   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   Counseling regarding end of life decision making 10/04/2014   OA (osteoarthritis) of neck 08/02/2014   History of duodenal ulcer 08/02/2014   Seasonal allergies 08/02/2014   Hypertension associated with diabetes (HCC) 08/02/2014    Hyperlipidemia associated with type 2 diabetes mellitus (HCC) 08/02/2014   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   History of joint replacement 03/01/2014   Diabetes mellitus type 2 with retinopathy (HCC) 11/07/2009   PCP: Judithann Novas, MD REFERRING PROVIDER: Rojelio Clement, PA  REFERRING DIAG: status post right partial knee replacement, hamstring tendinosis of right thigh, strain of right hip adductor muscle  THERAPY DIAG:  Pain in right thigh  Chronic pain of right knee  Rationale for Evaluation and Treatment: Rehabilitation  ONSET DATE: September/October 2024  SUBJECTIVE:   PERTINENT HISTORY: Patient is a 78 y.o. female who presents to outpatient physical therapy with a referral for medical diagnosis status post right partial knee replacement, hamstring tendinosis of right thigh, strain of right hip adductor muscle. This patient's chief complaints consist of pain at the distal medial right thigh, leading to the following functional deficits: difficulty with sleeping, exercising, walking, going up and down stairs,  doing ADLs and IADLs, lifting an object of the floor, performing heavy activities around  the home, rolling over in bed. Relevant past medical history and comorbidities include the following: she has Diabetes mellitus type 2 with retinopathy (HCC); OA (osteoarthritis) of neck; History of duodenal ulcer; Seasonal allergies; Hypertension associated with diabetes (HCC); Hyperlipidemia associated with type 2 diabetes mellitus (HCC); Lichen plano-pilaris; Osteoporosis of forearm; History of joint replacement; Vitamin D  deficiency; HSV-1 (herpes simplex virus 1) infection, vaginal; Primary osteoarthritis of right knee; Diverticular disease; Malignant neoplasm of upper-outer quadrant of left breast in female, estrogen receptor positive (HCC); Diabetic retinopathy (HCC); Anemia; Carotid artery aneurysm (HCC); Chemotherapy-induced peripheral neuropathy (HCC); Peripheral edema; Aortic  atherosclerosis (HCC); Atherosclerosis of coronary artery of native heart without angina pectoris; Bronchiectasis without complication (HCC); Decreased GFR; Vitamin B 12 deficiency; Leukopenia; Elevated ferritin; Overweight (BMI 25.0-29.9); Burning with urination; Abdominal pain, left lower quadrant; Acute diverticulitis; Chronic cough; Reactive airways dysfunction syndrome (HCC); Acute cough; Hypercalcemia; and Pre-syncope on their problem list. she  has a past medical history of Allergy, Anemia, Aneurysm (HCC), Blood transfusion without reported diagnosis, Cancer (HCC), Coronary artery disease, Diverticulitis, DJD (degenerative joint disease), DM type 2 (diabetes mellitus, type 2) (HCC), F Fibrocystic breast disease, GERD (gastroesophageal reflux disease), History of chicken pox, Hypercholesteremia, Hypertension, Lichen planopilaris, Neuromuscular disorder (HCC), Sleep apnea, Urinary incontinence, and Vertigo (12/2019). she  has a past surgical history that includes Placement of breast implants (12/26/2004); Dilation and curettage of uterus (01/1994); Finger surgery (2001 & 2003); Breast surgery (02/1975); Rhinoplasty (03/1964); Foot surgery (1980); Breast surgery (10/1981); Breast surgery (07/1998); Foot surgery (07/2008); Breast surgery (01/1976); Cholecystectomy (N/A, 07/08/2016); Joint replacement R THA (04/19/2013);  Mastectomy w/ sentinel node biopsy (Bilateral, 03/08/2019); Breast implant removal (Bilateral, 03/08/2019); Replacement total knee (Left); Cataract extraction w/PHACO (Right, 02/08/2020); Cataract extraction w/PHACO (Left, 02/29/2020); Mastectomy; and Partial knee arthroplasty (Right, 12/31/2022). Patient denies hx of stroke, seizures, unexplained weight loss, unexplained changes in bowel or bladder problems, unexplained stumbling or dropping things, and spinal surgery. Has osteoporosis in forearm (spine steady), almost 5 years out from breast cancer, has some lung damage from chemo, and has CAD  but not cardiac events .  Exercise history: Usually goes to the gym pretty regularly with her husband. Mostly does the Nustep since her knee surgery. Also has done the treadmill. Avoids the machines if her concordant pain is actively hurting. She is getting a new recumbent bike.   SUBJECTIVE STATEMENT: Patient state she felt better after last PT session and was not sore the next day. She went to the gym yesterday but avoided the heavier strength training exercises since they are supposed to be every other day and she expected to do them again today in PT.   PAIN: NPRS: Current: 2.5/10 medial side of distal posterior thigh, left hip Best: 0/10, Worst: 7-8/10. Pain location: medial side of distal posterior thigh Pain description: sharp then gradually fades, lasts a few minutes.  Aggravating factors: turning over in the bed or lifting her R leg up when she is laying on her back.  Relieving factors: puts voltaren gel on it (unsure if it helps).       FUNCTIONAL LIMITATIONS: difficulty with sleeping, exercising, walking, going up and down stairs,  doing ADLs and IADLs, lifting an object of the floor, performing heavy activities around the home, rolling over in bed.  LEISURE: goes to the gym usually 2-3 times a week, traveling, exercising, going to the movies, pretty active for age. She has a set of 2# and 3# DB at home, recumbent bike.    PRECAUTIONS: None, used  to be not allowed to lift anything over 30# due to aneurism that was repaired  WEIGHT BEARING RESTRICTIONS: No  FALLS:  Has patient fallen in last 6 months? 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   NEXT MD VISIT:   OBJECTIVE                                                                                                                             TREATMENT Therapeutic exercise: therapeutic  exercises that incorporate ONE parameter at one or more areas of the body to centralize symptoms, develop strength and endurance, range of motion, and flexibility required for successful completion of functional activities.   NuStep using bilateral upper and lower extremities. For improved extremity mobility, muscular endurance, and activity tolerance; and to induce the analgesic effect of aerobic exercise, stimulate improved joint nutrition, and prepare body structures and systems for following interventions. Also to reinforce understanding of appropriate exercise intensity to help meet physical activity guidelines for health.  Seat/handle setting: 6/8 6  minutes Level: 6 Target SPM: ad lib Average SPM: 66 RPE: 8/10  Standing lumbar extension with hands on pelvis 2x10  Standing hip adductor squeeze into Pilates ring placed at ankles with B UE support 3x15 each side  Seated hip adductor stretch in modified pancake position 3x1 minute  Seated on 8 inch step leaning forwards to 6 inch yoga block Min assistance from PT with getting up from stool.   Therapeutic activities: dynamic therapeutic activities incorporating MULTIPLE parameters or areas of the body designed to achieve improved functional performance.  Superset:  Standing RDL with KB yoga block to max ROM with braced back 3x15 with 30#KB Cuing for flat back and scapular retraction with lat activation  Lateral stepping with [employer] under feet and crossed in front 3x20 steps each direction Cuing to lead with heels Difficulty keeping toes forwards Feels strongly in L lateral hip  Standing lateral lunge with U UE support 3x15 each side with 10#DB in contralateral UE  Working on bigger hip hinge   Pt required multimodal cuing for proper technique and to facilitate improved neuromuscular control, strength, range of motion, and functional ability resulting in improved performance and form.   PATIENT EDUCATION:  Education  details: Exercise purpose/form. Self management techniques. Person educated: Patient Education method: Explanation, Demonstration, and Handouts Education comprehension: verbalized understanding and needs further education  HOME EXERCISE PROGRAM: Access Code: [email or web address] URL: [email or web address] Date: 01/20/2024 Prepared by: Alleen Isle  Exercises - Supine Extended Bridge on Heels  - 1 x daily - 1 sets - 3 reps - 45 seconds hold - Hip Adductors and Hamstring Stretch with Strap  - 1 x daily - 3 reps - 45 seconds hold - Hip Hikes off step  - 1 x daily - 3 sets - 15 reps - Side Lunge with Counter Support  -  2-3 x daily - 3 sets - 15 reps - Kettlebell Deadlift  - 2-3 x weekly - 3 sets - 10 reps  ASSESSMENT:  CLINICAL IMPRESSION: Patient feeling better since she has gotten back to a more regular movement/exercise routine. She was able to increase load on deadlift today but it caused more low back/glute fatigue. Utilized superset with side stepping to improve recovery after muscular fatigue for both of these exercises. Patient continues to have some symptoms, but is overall progressing towards readiness to discharge from PT. Patient would benefit from continued management of limiting condition by skilled physical therapist to address remaining impairments and functional limitations to work towards stated goals and return to PLOF or maximal functional independence.   OBJECTIVE IMPAIRMENTS: decreased activity tolerance, decreased knowledge of condition, decreased mobility, difficulty walking, and pain.   ACTIVITY LIMITATIONS: lifting, sleeping, and stairs  PARTICIPATION LIMITATIONS: community activity and  difficulty with sleeping, exercising, walking, going up and down stairs,  doing ADLs and IADLs, lifting an object of the floor, performing heavy activities around the home, rolling over in bed  PERSONAL FACTORS: Age, Fitness, Past/current experiences, Time since onset of  injury/illness/exacerbation, and 3+ comorbidities:  Diabetes mellitus type 2 with retinopathy (HCC); OA (osteoarthritis) of neck; History of duodenal ulcer; Seasonal allergies; Hypertension associated with diabetes (HCC); Hyperlipidemia associated with type 2 diabetes mellitus (HCC); Lichen plano-pilaris; Osteoporosis of forearm; History of joint replacement; Vitamin D  deficiency; HSV-1 (herpes simplex virus 1) infection, vaginal; Primary osteoarthritis of right knee; Diverticular disease; Malignant neoplasm of upper-outer quadrant of left breast in female, estrogen receptor positive (HCC); Diabetic retinopathy (HCC); Anemia; Carotid artery aneurysm (HCC); Chemotherapy-induced peripheral neuropathy (HCC); Peripheral edema; Aortic atherosclerosis (HCC); Atherosclerosis of coronary artery of native heart without angina pectoris; Bronchiectasis without complication (HCC); Decreased GFR; Vitamin B 12 deficiency; Leukopenia; Elevated ferritin; Overweight (BMI 25.0-29.9); Burning with urination; Abdominal pain, left lower quadrant; Acute diverticulitis; Chronic cough; Reactive airways dysfunction syndrome (HCC); Acute cough; Hypercalcemia; and Pre-syncope on their problem list. she  has a past medical history of Allergy, Anemia, Aneurysm (HCC), Blood transfusion without reported diagnosis, Cancer (HCC), Coronary artery disease, Diverticulitis, DJD (degenerative joint disease), DM type 2 (diabetes mellitus, type 2) (HCC), F Fibrocystic breast disease, GERD (gastroesophageal reflux disease), History of chicken pox, Hypercholesteremia, Hypertension, Lichen planopilaris, Neuromuscular disorder (HCC), Sleep apnea, Urinary incontinence, and Vertigo (12/2019). she  has a past surgical history that includes Placement of breast implants (12/26/2004); Dilation and curettage of uterus (01/1994); Finger surgery (2001 & 2003); Breast surgery (02/1975); Rhinoplasty (03/1964); Foot surgery (1980); Breast surgery (10/1981); Breast  surgery (07/1998); Foot surgery (07/2008); Breast surgery (01/1976); Cholecystectomy (N/A, 07/08/2016); Joint replacement R THA (04/19/2013);  Mastectomy w/ sentinel node biopsy (Bilateral, 03/08/2019); Breast implant removal (Bilateral, 03/08/2019); Replacement total knee (Left); Cataract extraction w/PHACO (Right, 02/08/2020); Cataract extraction w/PHACO (Left, 02/29/2020); Mastectomy; and Partial knee arthroplasty (Right, 12/31/2022) are also affecting patient's functional outcome.   REHAB POTENTIAL: Good CLINICAL DECISION MAKING: Evolving/moderate complexity EVALUATION COMPLEXITY: Moderate  GOALS: Goals reviewed with patient? No SHORT TERM GOALS: Target date: 12/24/2023  Patient will be independent with initial home exercise program for self-management of symptoms. Baseline: Initial HEP to be provided at visit 2 as appropriate (12/10/23); Goal status: MET  LONG TERM GOALS: Target date: 03/03/2024  Patient will be independent with a long-term home exercise program for self-management of symptoms.  Baseline: Initial HEP to be provided at visit 2 as appropriate (12/10/23); completes exercises approximately half the days since last PT session (01/20/2024);  Goal  status: In progress  2.  Patient will demonstrate improvement in Patient Specific Functional Scale (PSFS) of equal or greater than 8/10 points to reflect clinically significant improvement in patient's most valued functional activities. Baseline: to be measured at visit 2 as appropriate (12/10/23); 4.5/10 (12/15/2023); 9.75/10 (01/20/2024);  Goal status: MET  3.  Patient will demonstrate the ability to go up and down 12 steps without concordant pain to improve her household and community mobility.  Baseline: pain with stairs (12/10/23); no pain with stairs (01/20/2024);  Goal status: MET  4.  Patient will report NPRS equal or less than 3/10 during functional activities during the last 2 weeks to improve their abilitly to complete  community, work and/or recreational activities with less limitation. Baseline: 7/10 (12/10/23); 7-8/10 but less frequent, this much pain is not very often (01/20/2024);  Goal status: In progress  PLAN:  PT FREQUENCY: 1-2x/week  PT DURATION: 8-12 weeks  PLANNED INTERVENTIONS: 97164- PT Re-evaluation, 97750- Physical Performance Testing, 97110-Therapeutic exercises, 97530- Therapeutic activity, [id number]- Neuromuscular re-education, 97535- Self Care, 16109- Manual therapy, [id number]- Electrical stimulation (unattended), Patient/Family education, Stair training, Dry Needling, Joint mobilization, Spinal mobilization, Cryotherapy, and Moist heat  PLAN FOR NEXT SESSION: update HEP as appropriate, progressive loading to hip with focus on Rt adductor and hamstring groups as tolerated. Education. Manual therapy as appropriate.   Carilyn Charles. Artemio Larry, PT, DPT 02/04/24, 3:19 PM  [facility] [address] [place], [place] [address] P: [phone] I F: [phone]

## 2024-02-09 ENCOUNTER — Encounter: Payer: Self-pay | Admitting: Physical Therapy

## 2024-02-09 ENCOUNTER — Ambulatory Visit: Admitting: Physical Therapy

## 2024-02-09 DIAGNOSIS — M79651 Pain in right thigh: Secondary | ICD-10-CM | POA: Diagnosis not present

## 2024-02-09 DIAGNOSIS — G8929 Other chronic pain: Secondary | ICD-10-CM

## 2024-02-09 DIAGNOSIS — M25561 Pain in right knee: Secondary | ICD-10-CM | POA: Diagnosis not present

## 2024-02-09 NOTE — Therapy (Signed)
 OUTPATIENT PHYSICAL THERAPY TREATMENT    Patient Name: Katie [name] MRN: [id number] DOB:1946-02-26, 78 y.o., female Today's Date: 02/09/2024  END OF SESSION:  PT End of Session - 02/09/24 1449     Visit Number 17    Number of Visits 20    Date for PT Re-Evaluation 03/03/24    Authorization Type [employer]IC[employer] reporting period from 12/10/2023    Authorization Time Period auth 20 visits  4/16-6/20  Authorization #[id number]    Authorization - Visit Number 17    Authorization - Number of Visits 20    Progress Note Due on Visit 20    PT Start Time 1437    PT Stop Time 1515    PT Time Calculation (min) 38 min    Activity Tolerance Patient tolerated treatment well;No increased pain    Behavior During Therapy WFL for tasks assessed/performed                  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, small, not treating-watching , it is now stented   Blood transfusion without reported diagnosis    Cancer (HCC)    breast 08/2018   Coronary artery disease    Diverticulitis    DJD (degenerative joint disease)    DM type 2 (diabetes mellitus, type 2) (HCC)    Family history of breast cancer    Fibrocystic breast disease    GERD (gastroesophageal reflux disease)    History of chicken pox    Hypercholesteremia    Hypertension    Lichen planopilaris    Neuromuscular disorder (HCC)    Sleep apnea    cpap   Urinary incontinence    Vertigo 12/2019   1 episode   Past Surgical History:  Procedure Laterality Date   BREAST IMPLANT REMOVAL Bilateral 03/08/2019   Procedure: Removal of Bilateral Breast Implants;  Surgeon: Enid Harry, MD;  Location: [facility] OR;  Service: General;  Laterality: Bilateral;   BREAST SURGERY  02/1975   Left Breast Biopsy-Fibrocystic Disease   BREAST SURGERY  10/1981   Bialteral Capsulectomy Breast Surgery   BREAST SURGERY  07/1998   Replace Bilateral Silicone Implants   BREAST SURGERY   01/1976   Bilater breast surgery to remove fibrocystic tissue and replace with silicone implants   CATARACT EXTRACTION W/PHACO Right 02/08/2020   Procedure: CATARACT EXTRACTION PHACO AND INTRAOCULAR LENS PLACEMENT (IOC) RIGHT DIABETIC;  Surgeon: Clair Crews, MD;  Location: [facility];  Service: Ophthalmology;  Laterality: Right;  3.55 0:33.7   CATARACT EXTRACTION W/PHACO Left 02/29/2020   Procedure: CATARACT EXTRACTION PHACO AND INTRAOCULAR LENS PLACEMENT (IOC) LEFT DIABETIC 2.93  00:27.1;  Surgeon: Clair Crews, MD;  Location: [facility];  Service: Ophthalmology;  Laterality: Left;  Diabetic - oral meds   CHOLECYSTECTOMY N/A 07/08/2016   Procedure: LAPAROSCOPIC CHOLECYSTECTOMY;  Surgeon: Gwyndolyn Lerner, MD;  Location: [facility] ORS;  Service: General;  Laterality: N/A;   COLONOSCOPY WITH PROPOFOL  N/A 11/07/2017   Procedure: COLONOSCOPY WITH PROPOFOL ;  Surgeon: Luke Salaam, MD;  Location: [facility] END[facility]COPY;  Service: Gastroenterology;  Laterality: N/A;   DILATION AND CURETTAGE OF UTERUS  01/1994   ESOPHAGOGASTRODUODENOSCOPY (EGD) WITH PROPOFOL  N/A 12/05/2016   Procedure: ESOPHAGOGASTRODUODENOSCOPY (EGD) WITH PROPOFOL ;  Surgeon: Luke Salaam, MD;  Location: [facility] ENDOSCOPY;  Service: Endoscopy;  Laterality: N/A;   FINGER SURGERY  2001 & 2003   Trigger Finger   FOOT SURGERY  1980   To Relieve Pinched Nerve   FOOT SURGERY  07/2008   Left Foot-Plantar Fasciitis   JOINT REPLACEMENT  04/19/2013   Hip Replacement-Right   MASTECTOMY     double   MASTECTOMY W/ SENTINEL NODE BIOPSY Bilateral 03/08/2019   Procedure: BILATERAL MASTECTOMIES WITH LEFT AXILLARY SENTINEL LYMPH NODE BIOPSY AND BLUE DYE INJECTION;  Surgeon: Enid Harry, MD;  Location: [facility] OR;  Service: General;  Laterality: Bilateral;   PARTIAL KNEE ARTHROPLASTY Right 12/31/2022   Procedure: RIGHT PARTIAL KNEE ARTHROPLASTY;  Surgeon: Elner Hahn, MD;  Location: [facility] ORS;  Service: Orthopedics;  Laterality: Right;    PLACEMENT OF BREAST IMPLANTS  12/26/2004   Replace Failed Implants   REPLACEMENT TOTAL KNEE Left    2019-   RHINOPLASTY  03/1964   To Correct Deviated Septum   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/2023   Acute diverticulitis 03/21/2023   Abdominal pain, left lower quadrant 03/16/2023   Burning with urination 03/07/2023   Overweight (BMI 25.0-29.9) 03/05/2022   Elevated ferritin 03/23/2021   Decreased GFR 02/23/2021   Vitamin B 12 deficiency 02/23/2021   Leukopenia 02/23/2021   Bronchiectasis without complication (HCC) 01/26/2020   Aortic atherosclerosis (HCC) 12/23/2019   Atherosclerosis of coronary artery of native heart without angina pectoris 12/23/2019   Peripheral edema 12/22/2019   Chemotherapy-induced peripheral neuropathy (HCC) 10/29/2019   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   Anemia 10/15/2018   Diabetic retinopathy (HCC) 09/29/2018   Family history of leukemia 09/16/2018   Family history of breast cancer    Malignant neoplasm of upper-outer quadrant of left breast in female, 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/11/2018   Family history of breast cancer in sister 08/28/2018   Diverticular disease 10/17/2017   Primary osteoarthritis of right knee 03/07/2017   HSV-1 (herpes simplex virus 1) infection, 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   Vitamin D  deficiency 10/11/2016   Family history of thyroid  disorder 04/04/2015   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   Counseling regarding end of life decision making 10/04/2014   OA (osteoarthritis) of neck 08/02/2014   History of duodenal ulcer 08/02/2014   Seasonal allergies 08/02/2014   Hypertension associated with diabetes (HCC) 08/02/2014    Hyperlipidemia associated with type 2 diabetes mellitus (HCC) 08/02/2014   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   History of joint replacement 03/01/2014   Diabetes mellitus type 2 with retinopathy (HCC) 11/07/2009   PCP: Judithann Novas, MD REFERRING PROVIDER: Rojelio Clement, PA  REFERRING DIAG: status post right partial knee replacement, hamstring tendinosis of right thigh, strain of right hip adductor muscle  THERAPY DIAG:  Pain in right thigh  Chronic pain of right knee  Rationale for Evaluation and Treatment: Rehabilitation  ONSET DATE: September/October 2024  SUBJECTIVE:   PERTINENT HISTORY: Patient is a 78 y.o. female who presents to outpatient physical therapy with a referral for medical diagnosis status post right partial knee replacement, hamstring tendinosis of right thigh, strain of right hip adductor muscle. This patient's chief complaints consist of pain at the distal medial right thigh, leading to the following functional deficits: difficulty with sleeping, exercising, walking, going up and down stairs,  doing ADLs and IADLs, lifting an object of the floor, performing heavy activities around the home,  rolling over in bed. Relevant past medical history and comorbidities include the following: she has Diabetes mellitus type 2 with retinopathy (HCC); OA (osteoarthritis) of neck; History of duodenal ulcer; Seasonal allergies; Hypertension associated with diabetes (HCC); Hyperlipidemia associated with type 2 diabetes mellitus (HCC); Lichen plano-pilaris; Osteoporosis of forearm; History of joint replacement; Vitamin D  deficiency; HSV-1 (herpes simplex virus 1) infection, vaginal; Primary osteoarthritis of right knee; Diverticular disease; Malignant neoplasm of upper-outer quadrant of left breast in female, estrogen receptor positive (HCC); Diabetic retinopathy (HCC); Anemia; Carotid artery aneurysm (HCC); Chemotherapy-induced peripheral neuropathy (HCC); Peripheral edema; Aortic  atherosclerosis (HCC); Atherosclerosis of coronary artery of native heart without angina pectoris; Bronchiectasis without complication (HCC); Decreased GFR; Vitamin B 12 deficiency; Leukopenia; Elevated ferritin; Overweight (BMI 25.0-29.9); Burning with urination; Abdominal pain, left lower quadrant; Acute diverticulitis; Chronic cough; Reactive airways dysfunction syndrome (HCC); Acute cough; Hypercalcemia; and Pre-syncope on their problem list. she  has a past medical history of Allergy, Anemia, Aneurysm (HCC), Blood transfusion without reported diagnosis, Cancer (HCC), Coronary artery disease, Diverticulitis, DJD (degenerative joint disease), DM type 2 (diabetes mellitus, type 2) (HCC), F Fibrocystic breast disease, GERD (gastroesophageal reflux disease), History of chicken pox, Hypercholesteremia, Hypertension, Lichen planopilaris, Neuromuscular disorder (HCC), Sleep apnea, Urinary incontinence, and Vertigo (12/2019). she  has a past surgical history that includes Placement of breast implants (12/26/2004); Dilation and curettage of uterus (01/1994); Finger surgery (2001 & 2003); Breast surgery (02/1975); Rhinoplasty (03/1964); Foot surgery (1980); Breast surgery (10/1981); Breast surgery (07/1998); Foot surgery (07/2008); Breast surgery (01/1976); Cholecystectomy (N/A, 07/08/2016); Joint replacement R THA (04/19/2013);  Mastectomy w/ sentinel node biopsy (Bilateral, 03/08/2019); Breast implant removal (Bilateral, 03/08/2019); Replacement total knee (Left); Cataract extraction w/PHACO (Right, 02/08/2020); Cataract extraction w/PHACO (Left, 02/29/2020); Mastectomy; and Partial knee arthroplasty (Right, 12/31/2022). Patient denies hx of stroke, seizures, unexplained weight loss, unexplained changes in bowel or bladder problems, unexplained stumbling or dropping things, and spinal surgery. Has osteoporosis in forearm (spine steady), almost 5 years out from breast cancer, has some lung damage from chemo, and has CAD  but not cardiac events .  Exercise history: Usually goes to the gym pretty regularly with her husband. Mostly does the Nustep since her knee surgery. Also has done the treadmill. Avoids the machines if her concordant pain is actively hurting. She is getting a new recumbent bike.   SUBJECTIVE STATEMENT: Patient states her leg is feeling pretty good. She has been lifting a lot of boxes this morning. The only spot that is really bothering her is her left hip/groin. She is planning to watch it and if it gets worse go back to the orthopedist.   PAIN: NPRS: Current: 1/10 medial side of distal posterior thigh, 4-5/10 left hip/groin  Best: 0/10, Worst: 7-8/10. Pain location: medial side of distal posterior thigh Pain description: sharp then gradually fades, lasts a few minutes.  Aggravating factors: turning over in the bed or lifting her R leg up when she is laying on her back.  Relieving factors: puts voltaren gel on it (unsure if it helps).       FUNCTIONAL LIMITATIONS: difficulty with sleeping, exercising, walking, going up and down stairs,  doing ADLs and IADLs, lifting an object of the floor, performing heavy activities around the home, rolling over in bed.  LEISURE: goes to the gym usually 2-3 times a week, traveling, exercising, going to the movies, pretty active for age. She has a set of 2# and 3# DB at home, recumbent bike.    PRECAUTIONS: None, used to be  not allowed to lift anything over 30# due to aneurism that was repaired  WEIGHT BEARING RESTRICTIONS: No  FALLS:  Has patient fallen in last 6 months? 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   NEXT MD VISIT:   OBJECTIVE                                                                                                                             TREATMENT Therapeutic exercise: therapeutic exercises that  incorporate ONE parameter at one or more areas of the body to centralize symptoms, develop strength and endurance, range of motion, and flexibility required for successful completion of functional activities.   NuStep using bilateral upper and lower extremities. For improved extremity mobility, muscular endurance, and activity tolerance; and to induce the analgesic effect of aerobic exercise, stimulate improved joint nutrition, and prepare body structures and systems for following interventions. Also to reinforce understanding of appropriate exercise intensity to help meet physical activity guidelines for health.  Seat/handle setting: 6/8 6  minutes Level: 6 Target SPM: ad lib Average SPM: 66 RPE: 8/10  Standing lumbar extension with hands on pelvis 3x10  Standing hip adductor squeeze into Pilates ring placed at ankles with B UE support 3x15 each side  Seated hip adductor stretch in modified pancake position 3x1 minute  Seated on 8 inch step leaning forwards to 6 inch yoga block Min assistance from PT with getting up from stool.   Therapeutic activities: dynamic therapeutic activities incorporating MULTIPLE parameters or areas of the body designed to achieve improved functional performance.  Superset:  Standing RDL with KB yoga block to max ROM with braced back 3x15 with 30#KB Cuing for flat back and scapular retraction with lat activation  Lateral stepping with [employer] under feet and crossed in front 3x20 steps each direction Cuing to lead with heels Difficulty keeping toes forwards Feels strongly in L lateral hip  Standing lateral lunge with U UE support 3x15 each side with 10#DB in contralateral UE  Good carry over from prior cuing  Pt required multimodal cuing for proper technique and to facilitate improved neuromuscular control, strength, range of motion, and functional ability resulting in improved performance and form.   PATIENT EDUCATION:  Education details:  Exercise purpose/form. Self management techniques. Person educated: Patient Education method: Explanation, Demonstration, and Handouts Education comprehension: verbalized understanding and needs further education  HOME EXERCISE PROGRAM: Access Code: [email or web address] URL: [email or web address] Date: 01/20/2024 Prepared by: Alleen Isle  Exercises - Supine Extended Bridge on Heels  - 1 x daily - 1 sets - 3 reps - 45 seconds hold - Hip Adductors and Hamstring Stretch with Strap  - 1 x daily - 3 reps - 45 seconds hold - Hip Hikes off step  - 1 x daily - 3 sets - 15 reps - Side Lunge with Counter Support  -  2-3 x daily - 3 sets - 15 reps - Kettlebell Deadlift  - 2-3 x weekly - 3 sets - 10 reps  ASSESSMENT:  CLINICAL IMPRESSION: Patient continues to demonstrate good tolerance for PT interventions and is appropriately fatigued after exercises. Plan to consider for discharge next session. Patient would benefit from continued management of limiting condition by skilled physical therapist to address remaining impairments and functional limitations to work towards stated goals and return to PLOF or maximal functional independence.    OBJECTIVE IMPAIRMENTS: decreased activity tolerance, decreased knowledge of condition, decreased mobility, difficulty walking, and pain.   ACTIVITY LIMITATIONS: lifting, sleeping, and stairs  PARTICIPATION LIMITATIONS: community activity and  difficulty with sleeping, exercising, walking, going up and down stairs,  doing ADLs and IADLs, lifting an object of the floor, performing heavy activities around the home, rolling over in bed  PERSONAL FACTORS: Age, Fitness, Past/current experiences, Time since onset of injury/illness/exacerbation, and 3+ comorbidities:  Diabetes mellitus type 2 with retinopathy (HCC); OA (osteoarthritis) of neck; History of duodenal ulcer; Seasonal allergies; Hypertension associated with diabetes (HCC); Hyperlipidemia associated with type  2 diabetes mellitus (HCC); Lichen plano-pilaris; Osteoporosis of forearm; History of joint replacement; Vitamin D  deficiency; HSV-1 (herpes simplex virus 1) infection, vaginal; Primary osteoarthritis of right knee; Diverticular disease; Malignant neoplasm of upper-outer quadrant of left breast in female, estrogen receptor positive (HCC); Diabetic retinopathy (HCC); Anemia; Carotid artery aneurysm (HCC); Chemotherapy-induced peripheral neuropathy (HCC); Peripheral edema; Aortic atherosclerosis (HCC); Atherosclerosis of coronary artery of native heart without angina pectoris; Bronchiectasis without complication (HCC); Decreased GFR; Vitamin B 12 deficiency; Leukopenia; Elevated ferritin; Overweight (BMI 25.0-29.9); Burning with urination; Abdominal pain, left lower quadrant; Acute diverticulitis; Chronic cough; Reactive airways dysfunction syndrome (HCC); Acute cough; Hypercalcemia; and Pre-syncope on their problem list. she  has a past medical history of Allergy, Anemia, Aneurysm (HCC), Blood transfusion without reported diagnosis, Cancer (HCC), Coronary artery disease, Diverticulitis, DJD (degenerative joint disease), DM type 2 (diabetes mellitus, type 2) (HCC), F Fibrocystic breast disease, GERD (gastroesophageal reflux disease), History of chicken pox, Hypercholesteremia, Hypertension, Lichen planopilaris, Neuromuscular disorder (HCC), Sleep apnea, Urinary incontinence, and Vertigo (12/2019). she  has a past surgical history that includes Placement of breast implants (12/26/2004); Dilation and curettage of uterus (01/1994); Finger surgery (2001 & 2003); Breast surgery (02/1975); Rhinoplasty (03/1964); Foot surgery (1980); Breast surgery (10/1981); Breast surgery (07/1998); Foot surgery (07/2008); Breast surgery (01/1976); Cholecystectomy (N/A, 07/08/2016); Joint replacement R THA (04/19/2013);  Mastectomy w/ sentinel node biopsy (Bilateral, 03/08/2019); Breast implant removal (Bilateral, 03/08/2019); Replacement  total knee (Left); Cataract extraction w/PHACO (Right, 02/08/2020); Cataract extraction w/PHACO (Left, 02/29/2020); Mastectomy; and Partial knee arthroplasty (Right, 12/31/2022) are also affecting patient's functional outcome.   REHAB POTENTIAL: Good CLINICAL DECISION MAKING: Evolving/moderate complexity EVALUATION COMPLEXITY: Moderate  GOALS: Goals reviewed with patient? No SHORT TERM GOALS: Target date: 12/24/2023  Patient will be independent with initial home exercise program for self-management of symptoms. Baseline: Initial HEP to be provided at visit 2 as appropriate (12/10/23); Goal status: MET  LONG TERM GOALS: Target date: 03/03/2024  Patient will be independent with a long-term home exercise program for self-management of symptoms.  Baseline: Initial HEP to be provided at visit 2 as appropriate (12/10/23); completes exercises approximately half the days since last PT session (01/20/2024);  Goal status: In progress  2.  Patient will demonstrate improvement in Patient Specific Functional Scale (PSFS) of equal or greater than 8/10 points to reflect clinically significant improvement in patient's most valued functional activities. Baseline: to be measured at  visit 2 as appropriate (12/10/23); 4.5/10 (12/15/2023); 9.75/10 (01/20/2024);  Goal status: MET  3.  Patient will demonstrate the ability to go up and down 12 steps without concordant pain to improve her household and community mobility.  Baseline: pain with stairs (12/10/23); no pain with stairs (01/20/2024);  Goal status: MET  4.  Patient will report NPRS equal or less than 3/10 during functional activities during the last 2 weeks to improve their abilitly to complete community, work and/or recreational activities with less limitation. Baseline: 7/10 (12/10/23); 7-8/10 but less frequent, this much pain is not very often (01/20/2024);  Goal status: In progress  PLAN:  PT FREQUENCY: 1-2x/week  PT DURATION: 8-12 weeks  PLANNED  INTERVENTIONS: 97164- PT Re-evaluation, 97750- Physical Performance Testing, 97110-Therapeutic exercises, 97530- Therapeutic activity, [id number]- Neuromuscular re-education, 97535- Self Care, 19147- Manual therapy, [id number]- Electrical stimulation (unattended), Patient/Family education, Stair training, Dry Needling, Joint mobilization, Spinal mobilization, Cryotherapy, and Moist heat  PLAN FOR NEXT SESSION: update HEP as appropriate, progressive loading to hip with focus on Rt adductor and hamstring groups as tolerated. Education. Manual therapy as appropriate.   Carilyn Charles. Artemio Larry, PT, DPT 02/09/24, 3:15 PM  [facility] [address] [place], [place] [address] P: [phone] I F: [phone]

## 2024-02-11 ENCOUNTER — Ambulatory Visit: Admitting: Physical Therapy

## 2024-02-11 DIAGNOSIS — M79651 Pain in right thigh: Secondary | ICD-10-CM | POA: Diagnosis not present

## 2024-02-11 DIAGNOSIS — G8929 Other chronic pain: Secondary | ICD-10-CM

## 2024-02-11 DIAGNOSIS — M25561 Pain in right knee: Secondary | ICD-10-CM | POA: Diagnosis not present

## 2024-02-11 NOTE — Therapy (Signed)
 OUTPATIENT PHYSICAL THERAPY TREATMENT / DISCHARGE SUMMARY Dates of reporting from 12/10/2023 to 02/11/2024    Patient Name: Katie [name] MRN: [id number] DOB:[birth year], 78 y.o., female Today's Date: 02/11/2024  END OF SESSION:  PT End of Session - 02/11/24 1542     Visit Number 18    Number of Visits 20    Date for PT Re-Evaluation 03/03/24    Authorization Type [employer] reporting period from 12/10/2023    Authorization Time Period auth 20 visits  4/16-6/20  Authorization #[id number]    Authorization - Visit Number 18    Authorization - Number of Visits 20    Progress Note Due on Visit 20    PT Start Time 1354    PT Stop Time 1434    PT Time Calculation (min) 40 min    Activity Tolerance Patient tolerated treatment well;No increased pain    Behavior During Therapy WFL for tasks assessed/performed             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, small, not treating-watching , it is now stented   Blood transfusion without reported diagnosis    Cancer (HCC)    breast 08/2018   Coronary artery disease    Diverticulitis    DJD (degenerative joint disease)    DM type 2 (diabetes mellitus, type 2) (HCC)    Family history of breast cancer    Fibrocystic breast disease    GERD (gastroesophageal reflux disease)    History of chicken pox    Hypercholesteremia    Hypertension    Lichen planopilaris    Neuromuscular disorder (HCC)    Sleep apnea    cpap   Urinary incontinence    Vertigo 12/2019   1 episode   Past Surgical History:  Procedure Laterality Date   BREAST IMPLANT REMOVAL Bilateral 03/08/2019   Procedure: Removal of Bilateral Breast Implants;  Surgeon: Enid Harry, MD;  Location: [facility] OR;  Service: General;  Laterality: Bilateral;   BREAST SURGERY  02/1975   Left Breast Biopsy-Fibrocystic Disease   BREAST SURGERY  10/1981   Bialteral Capsulectomy Breast Surgery   BREAST SURGERY  07/1998    Replace Bilateral Silicone Implants   BREAST SURGERY  01/1976   Bilater breast surgery to remove fibrocystic tissue and replace with silicone implants   CATARACT EXTRACTION W/PHACO Right 02/08/2020   Procedure: CATARACT EXTRACTION PHACO AND INTRAOCULAR LENS PLACEMENT (IOC) RIGHT DIABETIC;  Surgeon: Clair Crews, MD;  Location: [facility];  Service: Ophthalmology;  Laterality: Right;  3.55 0:33.7   CATARACT EXTRACTION W/PHACO Left 02/29/2020   Procedure: CATARACT EXTRACTION PHACO AND INTRAOCULAR LENS PLACEMENT (IOC) LEFT DIABETIC 2.93  00:27.1;  Surgeon: Clair Crews, MD;  Location: [facility];  Service: Ophthalmology;  Laterality: Left;  Diabetic - oral meds   CHOLECYSTECTOMY N/A 07/08/2016   Procedure: LAPAROSCOPIC CHOLECYSTECTOMY;  Surgeon: Gwyndolyn Lerner, MD;  Location: [facility] ORS;  Service: General;  Laterality: N/A;   COLONOSCOPY WITH PROPOFOL  N/A 11/07/2017   Procedure: COLONOSCOPY WITH PROPOFOL ;  Surgeon: Luke Salaam, MD;  Location: [facility] END[facility];  Service: Gastroenterology;  Laterality: N/A;   DILATION AND CURETTAGE OF UTERUS  01/1994   ESOPHAGOGASTRODUODENOSCOPY (EGD) WITH PROPOFOL  N/A 12/05/2016   Procedure: ESOPHAGOGASTRODUODENOSCOPY (EGD) WITH PROPOFOL ;  Surgeon: Luke Salaam, MD;  Location: [facility] ENDOSCOPY;  Service: Endoscopy;  Laterality: N/A;   FINGER SURGERY  2001 & 2003   Trigger Finger  FOOT SURGERY  1980   To Relieve Pinched Nerve   FOOT SURGERY  07/2008   Left Foot-Plantar Fasciitis   JOINT REPLACEMENT  04/19/2013   Hip Replacement-Right   MASTECTOMY     double   MASTECTOMY W/ SENTINEL NODE BIOPSY Bilateral 03/08/2019   Procedure: BILATERAL MASTECTOMIES WITH LEFT AXILLARY SENTINEL LYMPH NODE BIOPSY AND BLUE DYE INJECTION;  Surgeon: Enid Harry, MD;  Location: [facility] OR;  Service: General;  Laterality: Bilateral;   PARTIAL KNEE ARTHROPLASTY Right 12/31/2022   Procedure: RIGHT PARTIAL KNEE ARTHROPLASTY;  Surgeon: Elner Hahn, MD;  Location:  [facility] ORS;  Service: Orthopedics;  Laterality: Right;   PLACEMENT OF BREAST IMPLANTS  12/26/2004   Replace Failed Implants   REPLACEMENT TOTAL KNEE Left    2019-   RHINOPLASTY  03/1964   To Correct Deviated Septum   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/2023   Acute diverticulitis 03/21/2023   Abdominal pain, left lower quadrant 03/16/2023   Burning with urination 03/07/2023   Overweight (BMI 25.0-29.9) 03/05/2022   Elevated ferritin 03/23/2021   Decreased GFR 02/23/2021   Vitamin B 12 deficiency 02/23/2021   Leukopenia 02/23/2021   Bronchiectasis without complication (HCC) 01/26/2020   Aortic atherosclerosis (HCC) 12/23/2019   Atherosclerosis of coronary artery of native heart without angina pectoris 12/23/2019   Peripheral edema 12/22/2019   Chemotherapy-induced peripheral neuropathy (HCC) 10/29/2019   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   Anemia 10/15/2018   Diabetic retinopathy (HCC) 09/29/2018   Family history of leukemia 09/16/2018   Family history of breast cancer    Malignant neoplasm of upper-outer quadrant of left breast in female, 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/11/2018   Family history of breast cancer in sister 08/28/2018   Diverticular disease 10/17/2017   Primary osteoarthritis of right knee 03/07/2017   HSV-1 (herpes simplex virus 1) infection, 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   Vitamin D  deficiency 10/11/2016   Family history of thyroid  disorder 04/04/2015   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   Counseling regarding end of life decision making 10/04/2014   OA (osteoarthritis) of neck 08/02/2014   History of duodenal ulcer 08/02/2014   Seasonal allergies 08/02/2014    Hypertension associated with diabetes (HCC) 08/02/2014   Hyperlipidemia associated with type 2 diabetes mellitus (HCC) 08/02/2014   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   History of joint replacement 03/01/2014   Diabetes mellitus type 2 with retinopathy (HCC) 11/07/2009   PCP: Judithann Novas, MD REFERRING PROVIDER: Rojelio Clement, PA  REFERRING DIAG: status post right partial knee replacement, hamstring tendinosis of right thigh, strain of right hip adductor muscle  THERAPY DIAG:  Pain in right thigh  Chronic pain of right knee  Rationale for Evaluation and Treatment: Rehabilitation  ONSET DATE: September/October 2024  SUBJECTIVE:   PERTINENT HISTORY: Patient is a 78 y.o. female who presents to outpatient physical therapy with a referral for medical diagnosis status post right partial knee replacement, hamstring tendinosis of right thigh, strain of right hip adductor muscle. This patient's chief complaints consist of pain at the distal medial right thigh, leading to the following functional deficits: difficulty with sleeping, exercising, walking, going up and down stairs,  doing ADLs and IADLs, lifting an object of the floor, performing heavy activities  around the home, rolling over in bed. Relevant past medical history and comorbidities include the following: she has Diabetes mellitus type 2 with retinopathy (HCC); OA (osteoarthritis) of neck; History of duodenal ulcer; Seasonal allergies; Hypertension associated with diabetes (HCC); Hyperlipidemia associated with type 2 diabetes mellitus (HCC); Lichen plano-pilaris; Osteoporosis of forearm; History of joint replacement; Vitamin D  deficiency; HSV-1 (herpes simplex virus 1) infection, vaginal; Primary osteoarthritis of right knee; Diverticular disease; Malignant neoplasm of upper-outer quadrant of left breast in female, estrogen receptor positive (HCC); Diabetic retinopathy (HCC); Anemia; Carotid artery aneurysm (HCC); Chemotherapy-induced  peripheral neuropathy (HCC); Peripheral edema; Aortic atherosclerosis (HCC); Atherosclerosis of coronary artery of native heart without angina pectoris; Bronchiectasis without complication (HCC); Decreased GFR; Vitamin B 12 deficiency; Leukopenia; Elevated ferritin; Overweight (BMI 25.0-29.9); Burning with urination; Abdominal pain, left lower quadrant; Acute diverticulitis; Chronic cough; Reactive airways dysfunction syndrome (HCC); Acute cough; Hypercalcemia; and Pre-syncope on their problem list. she  has a past medical history of Allergy, Anemia, Aneurysm (HCC), Blood transfusion without reported diagnosis, Cancer (HCC), Coronary artery disease, Diverticulitis, DJD (degenerative joint disease), DM type 2 (diabetes mellitus, type 2) (HCC), F Fibrocystic breast disease, GERD (gastroesophageal reflux disease), History of chicken pox, Hypercholesteremia, Hypertension, Lichen planopilaris, Neuromuscular disorder (HCC), Sleep apnea, Urinary incontinence, and Vertigo (12/2019). she  has a past surgical history that includes Placement of breast implants (12/26/2004); Dilation and curettage of uterus (01/1994); Finger surgery (2001 & 2003); Breast surgery (02/1975); Rhinoplasty (03/1964); Foot surgery (1980); Breast surgery (10/1981); Breast surgery (07/1998); Foot surgery (07/2008); Breast surgery (01/1976); Cholecystectomy (N/A, 07/08/2016); Joint replacement R THA (04/19/2013);  Mastectomy w/ sentinel node biopsy (Bilateral, 03/08/2019); Breast implant removal (Bilateral, 03/08/2019); Replacement total knee (Left); Cataract extraction w/PHACO (Right, 02/08/2020); Cataract extraction w/PHACO (Left, 02/29/2020); Mastectomy; and Partial knee arthroplasty (Right, 12/31/2022). Patient denies hx of stroke, seizures, unexplained weight loss, unexplained changes in bowel or bladder problems, unexplained stumbling or dropping things, and spinal surgery. Has osteoporosis in forearm (spine steady), almost 5 years out from breast  cancer, has some lung damage from chemo, and has CAD but not cardiac events .  Exercise history: Usually goes to the gym pretty regularly with her husband. Mostly does the Nustep since her knee surgery. Also has done the treadmill. Avoids the machines if her concordant pain is actively hurting. She is getting a new recumbent bike.   SUBJECTIVE STATEMENT: Patient states her right leg pain is gone today and her left hip pain is better. She feels like PT has helped a lot. She went to the gym this morning and was working on moving tax boxes around before her appointment today. She feels ready for discharge.   PAIN: NPRS: Current: 0/10 medial side of distal posterior thigh, 1/10 left hip/groin   PRECAUTIONS: None, used to be not allowed to lift anything over 30# due to aneurism that was repaired  WEIGHT BEARING RESTRICTIONS: No  FALLS:  Has patient fallen in last 6 months? no  PATIENT GOALS: want to get this pain to go away because it hurts.   NEXT MD VISIT:   OBJECTIVE                   SELF-REPORTED FUNCTION Patient Specific Functional Scale (PSFS)  Standing up from a sitting position: 9 Rolling over in bed: 10 Stairs: 10 Walking on the treadmill: 10 Average: 9.75/10   FUNCTIONAL TESTS Stairs: ascended/descended 12 steps with B UE support step over steps with B UE support as she does at home. No concordant pain.  Attempted without UE support at first but caught R heel on step on her way down and had to catch herself with her arms on the railings.                                                                                                     TREATMENT Therapeutic exercise: therapeutic exercises that incorporate ONE parameter at one or more areas of the body to centralize symptoms, develop strength and endurance, range of motion, and flexibility required for successful completion of functional activities.   NuStep using bilateral upper and lower extremities. For improved  extremity mobility, muscular endurance, and activity tolerance; and to induce the analgesic effect of aerobic exercise, stimulate improved joint nutrition, and prepare body structures and systems for following interventions. Also to reinforce understanding of appropriate exercise intensity to help meet physical activity guidelines for health.  Seat/handle setting: 6/8 6  minutes Level: 6 Target SPM: ad lib Average SPM: 67 RPE: 7/10  Standing lumbar extension with hands on pelvis 3x10  Review of long term HEP with education on how to access it on her phone. Provided handout, black theratube, and green theraband.   Therapeutic activities: dynamic therapeutic activities incorporating MULTIPLE parameters or areas of the body designed to achieve improved functional performance.  Stair climbing functional test (see above)  Standing RDL with KB yoga block to max ROM with braced back 3x15 with 30#KB Min cuing for flat back and scapular retraction with lat activation  Lateral stepping with BlackTheratube under feet and crossed in front 2x20 steps each direction  Pt required multimodal cuing for proper technique and to facilitate improved neuromuscular control, strength, range of motion, and functional ability resulting in improved performance and form.   PATIENT EDUCATION:  Education details: Exercise purpose/form. Self management techniques. Discharge recommendations. Long term HEP with handout.  Person educated: Patient Education method: Explanation, Demonstration, and Handouts Education comprehension: verbalized understanding and returned demonstration  HOME EXERCISE PROGRAM: Access Code: [email or web address] URL: [email or web address] Date: 02/11/2024 Prepared by: Alleen Isle  Exercises - Supine Extended Bridge on Heels  - 1 x daily - 1 sets - 3 reps - 45 seconds hold - Hip Adductors and Hamstring Stretch with Strap  - 1 x daily - 3 reps - 45 seconds hold - Hip Hikes off step   - 1 x daily - 3 sets - 15 reps - Side Lunge with Counter Support  - 3 x daily - 3 sets - 15 reps - Kettlebell Deadlift  - 3 x weekly - 3 sets - 15 reps - Side stepping with band  - 3 x weekly - 3 sets - 20 reps - Standing Isometric Hip Adduction with Ball at Wall  - 3 x weekly - 3 sets - 15 reps - Standing Lumbar Extension  - sets of 10 as needed when back is stiff or sore recommendation: - V Sit Hip Adductor Hamstring Stretch  - 3 x weekly - 3 sets - 1 min hold  ASSESSMENT:  CLINICAL IMPRESSION: Patient has attended 18 physical therapy sessions since starting current episode of care  on 12/10/2023. She has met all of her short and long term goals except her pain goal, which is partially met. Her pain has significantly improved but she still had an episode of pain higher than 3/10 when she traveled a log ways. Patient appears to be performing her HEP well and is now discharged to long term HEP for self management. Patient would benefit from continued management of limiting condition by skilled physical therapist to address remaining impairments and functional limitations to work towards stated goals and return to PLOF or maximal functional independence.   OBJECTIVE IMPAIRMENTS: decreased activity tolerance, decreased knowledge of condition, decreased mobility, difficulty walking, and pain.   ACTIVITY LIMITATIONS: lifting, sleeping, and stairs  PARTICIPATION LIMITATIONS: community activity and  difficulty with sleeping, exercising, walking, going up and down stairs,  doing ADLs and IADLs, lifting an object of the floor, performing heavy activities around the home, rolling over in bed  PERSONAL FACTORS: Age, Fitness, Past/current experiences, Time since onset of injury/illness/exacerbation, and 3+ comorbidities:  Diabetes mellitus type 2 with retinopathy (HCC); OA (osteoarthritis) of neck; History of duodenal ulcer; Seasonal allergies; Hypertension associated with diabetes (HCC); Hyperlipidemia  associated with type 2 diabetes mellitus (HCC); Lichen plano-pilaris; Osteoporosis of forearm; History of joint replacement; Vitamin D  deficiency; HSV-1 (herpes simplex virus 1) infection, vaginal; Primary osteoarthritis of right knee; Diverticular disease; Malignant neoplasm of upper-outer quadrant of left breast in female, estrogen receptor positive (HCC); Diabetic retinopathy (HCC); Anemia; Carotid artery aneurysm (HCC); Chemotherapy-induced peripheral neuropathy (HCC); Peripheral edema; Aortic atherosclerosis (HCC); Atherosclerosis of coronary artery of native heart without angina pectoris; Bronchiectasis without complication (HCC); Decreased GFR; Vitamin B 12 deficiency; Leukopenia; Elevated ferritin; Overweight (BMI 25.0-29.9); Burning with urination; Abdominal pain, left lower quadrant; Acute diverticulitis; Chronic cough; Reactive airways dysfunction syndrome (HCC); Acute cough; Hypercalcemia; and Pre-syncope on their problem list. she  has a past medical history of Allergy, Anemia, Aneurysm (HCC), Blood transfusion without reported diagnosis, Cancer (HCC), Coronary artery disease, Diverticulitis, DJD (degenerative joint disease), DM type 2 (diabetes mellitus, type 2) (HCC), F Fibrocystic breast disease, GERD (gastroesophageal reflux disease), History of chicken pox, Hypercholesteremia, Hypertension, Lichen planopilaris, Neuromuscular disorder (HCC), Sleep apnea, Urinary incontinence, and Vertigo (12/2019). she  has a past surgical history that includes Placement of breast implants (12/26/2004); Dilation and curettage of uterus (01/1994); Finger surgery (2001 & 2003); Breast surgery (02/1975); Rhinoplasty (03/1964); Foot surgery (1980); Breast surgery (10/1981); Breast surgery (07/1998); Foot surgery (07/2008); Breast surgery (01/1976); Cholecystectomy (N/A, 07/08/2016); Joint replacement R THA (04/19/2013);  Mastectomy w/ sentinel node biopsy (Bilateral, 03/08/2019); Breast implant removal (Bilateral,  03/08/2019); Replacement total knee (Left); Cataract extraction w/PHACO (Right, 02/08/2020); Cataract extraction w/PHACO (Left, 02/29/2020); Mastectomy; and Partial knee arthroplasty (Right, 12/31/2022) are also affecting patient's functional outcome.   REHAB POTENTIAL: Good CLINICAL DECISION MAKING: Evolving/moderate complexity EVALUATION COMPLEXITY: Moderate  GOALS: Goals reviewed with patient? No SHORT TERM GOALS: Target date: 12/24/2023  Patient will be independent with initial home exercise program for self-management of symptoms. Baseline: Initial HEP to be provided at visit 2 as appropriate (12/10/23); Goal status: MET  LONG TERM GOALS: Target date: 03/03/2024  Patient will be independent with a long-term home exercise program for self-management of symptoms.  Baseline: Initial HEP to be provided at visit 2 as appropriate (12/10/23); completes exercises approximately half the days since last PT session (01/20/2024);  Goal status: MET   2.  Patient will demonstrate improvement in Patient Specific Functional Scale (PSFS) of equal or greater than 8/10 points to reflect clinically significant improvement  in patient's most valued functional activities. Baseline: to be measured at visit 2 as appropriate (12/10/23); 4.5/10 (12/15/2023); 9.75/10 (01/20/2024; 02/11/24);  Goal status: MET  3.  Patient will demonstrate the ability to go up and down 12 steps without concordant pain to improve her household and community mobility.  Baseline: pain with stairs (12/10/23); no pain with stairs (01/20/2024; 02/11/24);  Goal status: MET  4.  Patient will report NPRS equal or less than 3/10 during functional activities during the last 2 weeks to improve their abilitly to complete community, work and/or recreational activities with less limitation. Baseline: 7/10 (12/10/23); 7-8/10 but less frequent, this much pain is not very often (01/20/2024); 5-6/10 less often and does not last as long as before (02/11/2024);   Goal status: Partially MET  PLAN:  PT FREQUENCY: 1-2x/week  PT DURATION: 8-12 weeks  PLANNED INTERVENTIONS: 97164- PT Re-evaluation, 97750- Physical Performance Testing, 97110-Therapeutic exercises, 97530- Therapeutic activity, [id number]- Neuromuscular re-education, 97535- Self Care, 16109- Manual therapy, [id number]- Electrical stimulation (unattended), Patient/Family education, Stair training, Dry Needling, Joint mobilization, Spinal mobilization, Cryotherapy, and Moist heat  PLAN FOR NEXT SESSION: Patient is now discharged from PT.   Carilyn Charles. Artemio Larry, PT, DPT 02/11/24, 3:50 PM  [facility] [address] [place], [place] [address] P: [phone] I F: [phone]

## 2024-02-12 ENCOUNTER — Other Ambulatory Visit: Payer: Self-pay | Admitting: Hematology and Oncology

## 2024-02-16 ENCOUNTER — Encounter: Admitting: Physical Therapy

## 2024-02-18 ENCOUNTER — Encounter: Admitting: Physical Therapy

## 2024-02-19 DIAGNOSIS — G4733 Obstructive sleep apnea (adult) (pediatric): Secondary | ICD-10-CM | POA: Diagnosis not present

## 2024-02-23 ENCOUNTER — Encounter: Admitting: Physical Therapy

## 2024-03-05 DIAGNOSIS — C50412 Malignant neoplasm of upper-outer quadrant of left female breast: Secondary | ICD-10-CM | POA: Diagnosis not present

## 2024-03-05 DIAGNOSIS — Z170 Estrogen receptor positive status [ER+]: Secondary | ICD-10-CM | POA: Diagnosis not present

## 2024-03-09 ENCOUNTER — Encounter: Payer: Self-pay | Admitting: Physical Therapy

## 2024-03-09 ENCOUNTER — Other Ambulatory Visit: Payer: Self-pay

## 2024-03-09 ENCOUNTER — Ambulatory Visit: Attending: General Surgery | Admitting: Physical Therapy

## 2024-03-09 DIAGNOSIS — R252 Cramp and spasm: Secondary | ICD-10-CM | POA: Insufficient documentation

## 2024-03-09 DIAGNOSIS — L599 Disorder of the skin and subcutaneous tissue related to radiation, unspecified: Secondary | ICD-10-CM | POA: Insufficient documentation

## 2024-03-09 DIAGNOSIS — M25612 Stiffness of left shoulder, not elsewhere classified: Secondary | ICD-10-CM | POA: Insufficient documentation

## 2024-03-09 DIAGNOSIS — Z170 Estrogen receptor positive status [ER+]: Secondary | ICD-10-CM | POA: Insufficient documentation

## 2024-03-09 DIAGNOSIS — C50412 Malignant neoplasm of upper-outer quadrant of left female breast: Secondary | ICD-10-CM | POA: Insufficient documentation

## 2024-03-09 NOTE — Therapy (Signed)
 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, small, not treating-watching , it is now stented   Blood transfusion without reported diagnosis    Cancer (HCC)    breast 08/2018   Coronary artery disease    Diverticulitis    DJD (degenerative joint disease)    DM type 2 (diabetes mellitus, type 2) (HCC)    Family history of breast cancer    Fibrocystic breast disease    GERD (gastroesophageal reflux disease)    History of chicken pox    Hypercholesteremia    Hypertension    Lichen planopilaris    Neuromuscular disorder (HCC)    Sleep apnea    cpap   Urinary incontinence    Vertigo 12/2019   1 episode   Past Surgical History:  Procedure Laterality Date   BREAST IMPLANT REMOVAL Bilateral 03/08/2019   Procedure: Removal of Bilateral Breast Implants;  Surgeon: Ebbie Cough, MD;  Location: [facility] OR;  Service: General;  Laterality: Bilateral;   BREAST SURGERY  02/1975   Left Breast Biopsy-Fibrocystic Disease   BREAST SURGERY  10/1981   Bialteral Capsulectomy Breast Surgery   BREAST SURGERY  07/1998   Replace Bilateral Silicone Implants   BREAST SURGERY  01/1976   Bilater breast surgery to remove fibrocystic tissue and replace with silicone implants   CATARACT EXTRACTION W/PHACO Right 02/08/2020   Procedure: CATARACT EXTRACTION PHACO AND INTRAOCULAR LENS PLACEMENT (IOC) RIGHT DIABETIC;  Surgeon: Jaye Fallow, MD;   Location: [facility];  Service: Ophthalmology;  Laterality: Right;  3.55 0:33.7   CATARACT EXTRACTION W/PHACO Left 02/29/2020   Procedure: CATARACT EXTRACTION PHACO AND INTRAOCULAR LENS PLACEMENT (IOC) LEFT DIABETIC 2.93  00:27.1;  Surgeon: Jaye Fallow, MD;  Location: [facility];  Service: Ophthalmology;  Laterality: Left;  Diabetic - oral meds   CHOLECYSTECTOMY N/A 07/08/2016   Procedure: LAPAROSCOPIC CHOLECYSTECTOMY;  Surgeon: Carlin Pastel, MD;  Location: [facility] ORS;  Service: General;  Laterality: N/A;   COLONOSCOPY WITH PROPOFOL  N/A 11/07/2017   Procedure: COLONOSCOPY WITH PROPOFOL ;  Surgeon: Therisa Bi, MD;  Location: [facility] ENDOSCOPY;  Service: Gastroenterology;  Laterality: N/A;   DILATION AND CURETTAGE OF UTERUS  01/1994   ESOPHAGOGASTRODUODENOSCOPY (EGD) WITH PROPOFOL  N/A 12/05/2016   Procedure: ESOPHAGOGASTRODUODENOSCOPY (EGD) WITH PROPOFOL ;  Surgeon: Bi Therisa, MD;  Location: [facility] ENDOSCOPY;  Service: Endoscopy;  Laterality: N/A;   FINGER SURGERY  2001 & 2003   Trigger Finger   FOOT SURGERY  1980   To Relieve Pinched Nerve   FOOT SURGERY  07/2008   Left Foot-Plantar Fasciitis   JOINT REPLACEMENT  04/19/2013   Hip Replacement-Right   MASTECTOMY     double   MASTECTOMY W/ SENTINEL NODE BIOPSY Bilateral 03/08/2019   Procedure: BILATERAL MASTECTOMIES WITH LEFT AXILLARY SENTINEL LYMPH NODE BIOPSY AND BLUE DYE  INJECTION;  Surgeon: Ebbie Cough, MD;  Location: [facility] OR;  Service: General;  Laterality: Bilateral;   PARTIAL KNEE ARTHROPLASTY Right 12/31/2022   Procedure: RIGHT PARTIAL KNEE ARTHROPLASTY;  Surgeon: Edie Norleen PARAS, MD;  Location: [facility] ORS;  Service: Orthopedics;  Laterality: Right;   PLACEMENT OF BREAST IMPLANTS  12/26/2004   Replace Failed Implants   REPLACEMENT TOTAL KNEE Left    2019-   RHINOPLASTY  03/1964   To Correct Deviated Septum   Patient Active Problem List   Diagnosis Date Noted   Early dry stage nonexudative age-related macular  degeneration of both eyes 12/18/2023   Bilateral foot pain 12/18/2023   Pre-syncope 11/06/2023   Hypercalcemia 09/10/2023   Acute cough 08/07/2023   Chronic cough 05/27/2023   Reactive airways dysfunction syndrome (HCC) 05/27/2023   Acute diverticulitis 03/21/2023   Abdominal pain, left lower quadrant 03/16/2023   Burning with urination 03/07/2023   Overweight (BMI 25.0-29.9) 03/05/2022   Elevated ferritin 03/23/2021   Decreased GFR 02/23/2021   Vitamin B 12 deficiency 02/23/2021   Leukopenia 02/23/2021   Bronchiectasis without complication (HCC) 01/26/2020   Aortic atherosclerosis (HCC) 12/23/2019   Atherosclerosis of coronary artery of native heart without angina pectoris 12/23/2019   Peripheral edema 12/22/2019   Chemotherapy-induced peripheral neuropathy (HCC) 10/29/2019   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   Anemia 10/15/2018   Diabetic retinopathy (HCC) 09/29/2018   Family history of leukemia 09/16/2018   Family history of breast cancer    Malignant neoplasm of upper-outer quadrant of left breast in female, 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/11/2018   Family history of breast cancer in sister 08/28/2018   Diverticular disease 10/17/2017   Primary osteoarthritis of right knee 03/07/2017   HSV-1 (herpes simplex virus 1) infection, 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   Vitamin D  deficiency 10/11/2016   Family history of thyroid  disorder 04/04/2015   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   Counseling regarding end of life decision making 10/04/2014   OA (osteoarthritis) of neck 08/02/2014   History of duodenal ulcer 08/02/2014   Seasonal allergies 08/02/2014   Hypertension associated with diabetes (HCC) 08/02/2014   Hyperlipidemia associated with type 2 diabetes mellitus (HCC) 08/02/2014   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   History of joint replacement 03/01/2014   Diabetes mellitus type 2 with retinopathy (HCC) 11/07/2009    PCP: Greig Ring, MD  REFERRING PROVIDER: Kip Lynwood Double, PA-C  REFERRING DIAG: R49.587 L breast cancer/ cording  THERAPY DIAG:  Stiffness of left shoulder, not elsewhere classified  Disorder of the skin and subcutaneous tissue related to radiation, unspecified  Cramp and spasm  Malignant neoplasm of upper-outer quadrant of left breast in female, estrogen receptor positive (HCC)  ONSET DATE: 03/08/2019  Rationale for Evaluation and Treatment: Rehabilitation  SUBJECTIVE:  SUBJECTIVE STATEMENT: I have a lot of tightness across my chest. It has developed since radiation. I also have cording.   PERTINENT HISTORY: In 2020 she underwent primary chemotherapy for a triple positive left breast cancer. This was followed by bilateral mastectomies and a sentinel lymph node biopsy  March 08 2019. 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 The right mastectomy was negative. The pathologist lost the nodes so there is no pathology on the nodes. This was followed by adjuvant radiation therapy and she is now on anastrozole  doing well. Due to take that for 7 years. SABRA DJD (degenerative joint disease)  DM II (diabetes mellitus, type II), controlled. R partial knee arthroplasty 12/31/2022, L total knee 2019, 04/19/2013 R hip replacement, has stent for cerebral aneurysm   PAIN:  Are you having pain? No pain sitting, hurts 3-4/10 when raising L arm  PRECAUTIONS: Other: L UE lymphedema risk, L total knee 2019, R partial knee 2024, R THA 2014, has stent for cerebral aneurysm  RED FLAGS: None   WEIGHT BEARING RESTRICTIONS: No  FALLS:  Has patient fallen in last 6 months? No  LIVING ENVIRONMENT: Lives with: lives with their spouse Lives in: House/apartment Stairs: Yes; Internal: 12 steps; can reach both and External: 5  steps; can reach both Has following equipment at home: Grab bars  OCCUPATION: retried  LEISURE: belongs to [employer] 2-4 days/week, does Nu[employer], treadmill, recumbent bike, weight machines  HAND DOMINANCE: right   PRIOR LEVEL OF FUNCTION: Independent  PATIENT GOALS: to loosen the muscle to improve mobility, loosen scar tissue   OBJECTIVE: Note: Objective measures were completed at Evaluation unless otherwise noted.  COGNITION: Overall cognitive status: Within functional limits for tasks assessed   PALPATION: Tightness palpable especially at lateral edge of scar, cording at L axilla  OBSERVATIONS / OTHER ASSESSMENTS: telangiectasias visible at L chest  POSTURE: rounded shoulders, forward head  UPPER EXTREMITY AROM/PROM:  A/PROM RIGHT   eval   Shoulder extension 71  Shoulder flexion 162  Shoulder abduction 150  Shoulder internal rotation 67  Shoulder external rotation 76    (Blank rows = not tested)  A/PROM LEFT   eval  Shoulder extension 50  Shoulder flexion 147 with p!  Shoulder abduction 135  Shoulder internal rotation 55  Shoulder external rotation 79    (Blank rows = not tested)    UPPER EXTREMITY STRENGTH: R 5/5, L 4/5  LYMPHEDEMA ASSESSMENTS:   SURGERY TYPE/DATE: bilateral mastectomy and SLNB 03/08/19  NUMBER OF LYMPH NODES REMOVED: 1 - was lost so no pathology  CHEMOTHERAPY: completed  RADIATION:completed  HORMONE TREATMENT: on anastrozole   INFECTIONS: none   LYMPHEDEMA ASSESSMENTS:   LANDMARK RIGHT  eval  At axilla  34.5  15 cm proximal to olecranon process 31.4  10 cm proximal to olecranon process 28  Olecranon process 24.2  15 cm proximal to ulnar styloid process 23  10 cm proximal to ulnar styloid process 20.9  Just proximal to ulnar styloid process 17.5  Across hand at thumb web space 18.5  At base of 2nd digit 6.6  (Blank rows = not tested)  LANDMARK LEFT  eval  At axilla  34.5  15 cm proximal to olecranon process  33  10 cm proximal to olecranon process 30.2  Olecranon process 26  15 cm proximal to ulnar styloid process 23.6  10 cm proximal to ulnar styloid process 21  Just proximal to ulnar styloid process 17  Across hand at thumb web space 18.3  At base of 2nd digit 6.5  (Blank rows = not tested)   QUICK DASH:   Quick Dash - 03/09/24 0001     Open a tight or new jar Mild difficulty    Do heavy household chores (wash walls, wash floors) Moderate difficulty    Carry a shopping bag or briefcase No difficulty    Wash your back Mild difficulty    Use a knife to cut food No difficulty    Recreational activities in which you take some force or impact through your arm, shoulder, or hand (golf, hammering, tennis) Mild difficulty    During the past week, to what extent has your arm, shoulder or hand problem interfered with your normal social activities with family, friends, neighbors, or groups? Slightly    During the past week, to what extent has your arm, shoulder or hand problem limited your work or other regular daily activities Slightly    Arm, shoulder, or hand pain. Mild    Tingling (pins and needles) in your arm, shoulder, or hand None    Difficulty Sleeping Mild difficulty    DASH Score 20.45 %                                                                                                                                     TREATMENT DATE:  03/09/24- none today due to time constraints    PATIENT EDUCATION:  Education details: goals and plan of care Person educated: Patient Education method: Explanation Education comprehension: verbalized understanding  HOME EXERCISE PROGRAM: None yet  ASSESSMENT:  CLINICAL IMPRESSION: Patient is a 78 y.o. female who was seen today for physical therapy evaluation and treatment for L shoulder decreased ROM, axillary cording and pec tightness. Pt underwent bilateral mastectomies and L SLNB for treatment of L breast cancer on 03/08/19. She has  developed increased tightness in L pec and decreased scar mobility which is limiting L shoulder ROM. She also has visible and palpable cording in L axilla. She would benefit from skilled PT services to improve L shoulder ROM, decrease tightness and improve cording to allow improved function and comfort.     OBJECTIVE IMPAIRMENTS: decreased knowledge of condition, decreased ROM, decreased strength, increased muscle spasms, impaired UE functional use, postural dysfunction, and pain.   ACTIVITY LIMITATIONS: carrying, lifting, and reach over head  PARTICIPATION LIMITATIONS: cleaning, laundry, and community activity  PERSONAL FACTORS: Past/current experiences and Time since onset of injury/illness/exacerbation are also affecting patient's functional outcome.   REHAB POTENTIAL: Good  CLINICAL DECISION MAKING: Stable/uncomplicated  EVALUATION COMPLEXITY: Low  GOALS: Goals reviewed with patient? Yes  SHORT TERM GOALS: Target date: 03/23/24  Pt will be able to verbalize lymphedema risk reduction practices.  Baseline: Goal status: INITIAL  2.  Pt will be independent in initial home exercise program for stretching and strengthening.  Baseline:  Goal status: INITIAL  LONG TERM GOALS: Target date: 04/06/24  Pt will demonstrate 160 degrees of L shoulder flexion to allow her to reach overhead.  Baseline:  Goal status: INITIAL  2.  Pt will demonstrate 160 degrees of L shoulder abduction to allow her to reach out to the side. Baseline:  Goal status: INITIAL  3.  Pt will report a 75% improvement in feelings of tightness with UE overhead motion to allow improved comfort. Baseline:  Goal status: INITIAL  4.  Pt will be able to raise L arm without increased pain to improve comfort. Baseline:  Goal status: INITIAL  5.  Pt will be independent in a home exercise program for continued stretching and strengthening.  Baseline:  Goal status: INITIAL   PLAN:  PT FREQUENCY: 2x/week  PT  DURATION: 4 weeks  PLANNED INTERVENTIONS: [id number]- PT Re-evaluation, 97110-Therapeutic exercises, 97530- Therapeutic activity, 97535- Self Care, [id number]- Manual therapy, 97760- Orthotic Initial, [id number]- Orthotic/Prosthetic subsequent, and Manual lymph drainage  PLAN FOR NEXT SESSION: pulleys, ball, foam roll for pec stretch, MFR cording, PROM  [facility], PT 03/09/2024, 4:04 PM

## 2024-03-10 ENCOUNTER — Ambulatory Visit: Admitting: Physical Therapy

## 2024-03-10 ENCOUNTER — Encounter: Payer: Self-pay | Admitting: Physical Therapy

## 2024-03-10 DIAGNOSIS — R252 Cramp and spasm: Secondary | ICD-10-CM

## 2024-03-10 DIAGNOSIS — Z170 Estrogen receptor positive status [ER+]: Secondary | ICD-10-CM

## 2024-03-10 DIAGNOSIS — L599 Disorder of the skin and subcutaneous tissue related to radiation, unspecified: Secondary | ICD-10-CM

## 2024-03-10 DIAGNOSIS — M25612 Stiffness of left shoulder, not elsewhere classified: Secondary | ICD-10-CM

## 2024-03-10 DIAGNOSIS — C50412 Malignant neoplasm of upper-outer quadrant of left female breast: Secondary | ICD-10-CM | POA: Diagnosis not present

## 2024-03-10 NOTE — Therapy (Signed)
 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, small, not treating-watching , it is now stented   Blood transfusion without reported diagnosis    Cancer (HCC)    breast 08/2018   Coronary artery disease    Diverticulitis    DJD (degenerative joint disease)    DM type 2 (diabetes mellitus, type 2) (HCC)    Family history of breast cancer    Fibrocystic breast disease    GERD (gastroesophageal reflux disease)    History of chicken pox    Hypercholesteremia    Hypertension    Lichen planopilaris    Neuromuscular disorder (HCC)    Sleep apnea    cpap   Urinary incontinence    Vertigo 12/2019   1 episode   Past Surgical History:  Procedure Laterality Date   BREAST IMPLANT REMOVAL Bilateral 03/08/2019   Procedure: Removal of Bilateral Breast Implants;  Surgeon: Ebbie Cough, MD;  Location: [facility] OR;  Service: General;  Laterality: Bilateral;   BREAST SURGERY  02/1975   Left Breast Biopsy-Fibrocystic Disease   BREAST SURGERY  10/1981   Bialteral Capsulectomy Breast Surgery   BREAST SURGERY  07/1998   Replace Bilateral Silicone Implants   BREAST SURGERY  01/1976   Bilater breast surgery to remove fibrocystic tissue and replace with silicone implants   CATARACT EXTRACTION W/PHACO Right 02/08/2020   Procedure: CATARACT EXTRACTION PHACO AND INTRAOCULAR LENS PLACEMENT (IOC) RIGHT DIABETIC;  Surgeon: Jaye Fallow, MD;   Location: [facility];  Service: Ophthalmology;  Laterality: Right;  3.55 0:33.7   CATARACT EXTRACTION W/PHACO Left 02/29/2020   Procedure: CATARACT EXTRACTION PHACO AND INTRAOCULAR LENS PLACEMENT (IOC) LEFT DIABETIC 2.93  00:27.1;  Surgeon: Jaye Fallow, MD;  Location: [facility];  Service: Ophthalmology;  Laterality: Left;  Diabetic - oral meds   CHOLECYSTECTOMY N/A 07/08/2016   Procedure: LAPAROSCOPIC CHOLECYSTECTOMY;  Surgeon: Carlin Pastel, MD;  Location: [facility] ORS;  Service: General;  Laterality: N/A;   COLONOSCOPY WITH PROPOFOL  N/A 11/07/2017   Procedure: COLONOSCOPY WITH PROPOFOL ;  Surgeon: Therisa Bi, MD;  Location: [facility] ENDOSCOPY;  Service: Gastroenterology;  Laterality: N/A;   DILATION AND CURETTAGE OF UTERUS  01/1994   ESOPHAGOGASTRODUODENOSCOPY (EGD) WITH PROPOFOL  N/A 12/05/2016   Procedure: ESOPHAGOGASTRODUODENOSCOPY (EGD) WITH PROPOFOL ;  Surgeon: Bi Therisa, MD;  Location: [facility] ENDOSCOPY;  Service: Endoscopy;  Laterality: N/A;   FINGER SURGERY  2001 & 2003   Trigger Finger   FOOT SURGERY  1980   To Relieve Pinched Nerve   FOOT SURGERY  07/2008   Left Foot-Plantar Fasciitis   JOINT REPLACEMENT  04/19/2013   Hip Replacement-Right   MASTECTOMY     double   MASTECTOMY W/ SENTINEL NODE BIOPSY Bilateral 03/08/2019   Procedure: BILATERAL MASTECTOMIES WITH LEFT AXILLARY SENTINEL LYMPH NODE BIOPSY AND BLUE DYE  INJECTION;  Surgeon: Ebbie Cough, MD;  Location: [facility] OR;  Service: General;  Laterality: Bilateral;   PARTIAL KNEE ARTHROPLASTY Right 12/31/2022   Procedure: RIGHT PARTIAL KNEE ARTHROPLASTY;  Surgeon: Edie Norleen PARAS, MD;  Location: [facility] ORS;  Service: Orthopedics;  Laterality: Right;   PLACEMENT OF BREAST IMPLANTS  12/26/2004   Replace Failed Implants   REPLACEMENT TOTAL KNEE Left    2019-   RHINOPLASTY  03/1964   To Correct Deviated Septum   Patient Active Problem List   Diagnosis Date Noted   Early dry stage nonexudative age-related macular  degeneration of both eyes 12/18/2023   Bilateral foot pain 12/18/2023   Pre-syncope 11/06/2023   Hypercalcemia 09/10/2023   Acute cough 08/07/2023   Chronic cough 05/27/2023   Reactive airways dysfunction syndrome (HCC) 05/27/2023   Acute diverticulitis 03/21/2023   Abdominal pain, left lower quadrant 03/16/2023   Burning with urination 03/07/2023   Overweight (BMI 25.0-29.9) 03/05/2022   Elevated ferritin 03/23/2021   Decreased GFR 02/23/2021   Vitamin B 12 deficiency 02/23/2021   Leukopenia 02/23/2021   Bronchiectasis without complication (HCC) 01/26/2020   Aortic atherosclerosis (HCC) 12/23/2019   Atherosclerosis of coronary artery of native heart without angina pectoris 12/23/2019   Peripheral edema 12/22/2019   Chemotherapy-induced peripheral neuropathy (HCC) 10/29/2019   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   Anemia 10/15/2018   Diabetic retinopathy (HCC) 09/29/2018   Family history of leukemia 09/16/2018   Family history of breast cancer    Malignant neoplasm of upper-outer quadrant of left breast in female, 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/11/2018   Family history of breast cancer in sister 08/28/2018   Diverticular disease 10/17/2017   Primary osteoarthritis of right knee 03/07/2017   HSV-1 (herpes simplex virus 1) infection, 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   Vitamin D  deficiency 10/11/2016   Family history of thyroid  disorder 04/04/2015   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   Counseling regarding end of life decision making 10/04/2014   OA (osteoarthritis) of neck 08/02/2014   History of duodenal ulcer 08/02/2014   Seasonal allergies 08/02/2014   Hypertension associated with diabetes (HCC) 08/02/2014   Hyperlipidemia associated with type 2 diabetes mellitus (HCC) 08/02/2014   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   History of joint replacement 03/01/2014   Diabetes mellitus type 2 with retinopathy (HCC) 11/07/2009    PCP: Greig Ring, MD  REFERRING PROVIDER: Kip Lynwood Double, PA-C  REFERRING DIAG: R49.587 L breast cancer/ cording  THERAPY DIAG:  Stiffness of left shoulder, not elsewhere classified  Disorder of the skin and subcutaneous tissue related to radiation, unspecified  Cramp and spasm  Malignant neoplasm of upper-outer quadrant of left breast in female, estrogen receptor positive (HCC)  ONSET DATE: 03/08/2019  Rationale for Evaluation and Treatment: Rehabilitation  SUBJECTIVE:  SUBJECTIVE STATEMENT: I have a lot of tightness across my chest. It has developed since radiation. I also have cording.   PERTINENT HISTORY: In 2020 she underwent primary chemotherapy for a triple positive left breast cancer. This was followed by bilateral mastectomies and a sentinel lymph node biopsy  March 08 2019. 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 The right mastectomy was negative. The pathologist lost the nodes so there is no pathology on the nodes. This was followed by adjuvant radiation therapy and she is now on anastrozole  doing well. Due to take that for 7 years. SABRA DJD (degenerative joint disease)  DM II (diabetes mellitus, type II), controlled. R partial knee arthroplasty 12/31/2022, L total knee 2019, 04/19/2013 R hip replacement, has stent for cerebral aneurysm   PAIN:  Are you having pain? No pain sitting, hurts 3-4/10 when raising L arm  PRECAUTIONS: Other: L UE lymphedema risk, L total knee 2019, R partial knee 2024, R THA 2014, has stent for cerebral aneurysm  RED FLAGS: None   WEIGHT BEARING RESTRICTIONS: No  FALLS:  Has patient fallen in last 6 months? No  LIVING ENVIRONMENT: Lives with: lives with their spouse Lives in: House/apartment Stairs: Yes; Internal: 12 steps; can reach both and External: 5  steps; can reach both Has following equipment at home: Grab bars  OCCUPATION: retried  LEISURE: belongs to [employer] 2-4 days/week, does [employer], treadmill, recumbent bike, weight machines  HAND DOMINANCE: right   PRIOR LEVEL OF FUNCTION: Independent  PATIENT GOALS: to loosen the muscle to improve mobility, loosen scar tissue   OBJECTIVE: Note: Objective measures were completed at Evaluation unless otherwise noted.  COGNITION: Overall cognitive status: Within functional limits for tasks assessed   PALPATION: Tightness palpable especially at lateral edge of scar, cording at L axilla  OBSERVATIONS / OTHER ASSESSMENTS: telangiectasias visible at L chest  POSTURE: rounded shoulders, forward head  UPPER EXTREMITY AROM/PROM:  A/PROM RIGHT   eval   Shoulder extension 71  Shoulder flexion 162  Shoulder abduction 150  Shoulder internal rotation 67  Shoulder external rotation 76    (Blank rows = not tested)  A/PROM LEFT   eval  Shoulder extension 50  Shoulder flexion 147 with p!  Shoulder abduction 135  Shoulder internal rotation 55  Shoulder external rotation 79    (Blank rows = not tested)    UPPER EXTREMITY STRENGTH: R 5/5, L 4/5  LYMPHEDEMA ASSESSMENTS:   SURGERY TYPE/DATE: bilateral mastectomy and SLNB 03/08/19  NUMBER OF LYMPH NODES REMOVED: 1 - was lost so no pathology  CHEMOTHERAPY: completed  RADIATION:completed  HORMONE TREATMENT: on anastrozole   INFECTIONS: none   LYMPHEDEMA ASSESSMENTS:   LANDMARK RIGHT  eval  At axilla  34.5  15 cm proximal to olecranon process 31.4  10 cm proximal to olecranon process 28  Olecranon process 24.2  15 cm proximal to ulnar styloid process 23  10 cm proximal to ulnar styloid process 20.9  Just proximal to ulnar styloid process 17.5  Across hand at thumb web space 18.5  At base of 2nd digit 6.6  (Blank rows = not tested)  LANDMARK LEFT  eval  At axilla  34.5  15 cm proximal to olecranon process  33  10 cm proximal to olecranon process 30.2  Olecranon process 26  15 cm proximal to ulnar styloid process 23.6  10 cm proximal to ulnar styloid process 21  Just proximal to ulnar styloid process 17  Across hand at thumb web space 18.3  At base of 2nd digit 6.5  (Blank rows = not tested)   QUICK DASH:                                                                                                                                TREATMENT DATE:  03/10/24: Therapeutic Exercise: Pulleys x 2 min in direction of  flexion and 2 min in direction of abduction with v/c to avoid scaption and keep elbow straight  Ball up wall x 10 reps in to flexion and 10 reps in to L abduction with v/c for correct form and pt returning therapist demo Supine over 1/2 foam roll with pt returning therapist demo for 10 reps each: alternating flexion, alternating scaption, horizontal abduction, snow angels with elbow straight (v/c for all to avoid compensation) Therapist demonstrated supine dowel exercises in to flexion and abduction and issued as part of HEP Manual therapy:  in supine with cocoa butter: STM to L pec and L chest to improve tissue mobility and decrease tightness and gentle MFR to cording in L axilla  03/09/24- none today due to time constraints    PATIENT EDUCATION:  Education details: goals and plan of care Person educated: Patient Education method: Explanation Education comprehension: verbalized understanding  HOME EXERCISE PROGRAM: Supine dowel flexion and abduction x 10 reps each with 5-15 sec holds  ASSESSMENT:  CLINICAL IMPRESSION: Began AAROM exercises today with cues to avoid compensation. Began supine exercises on 1/2 foam roll to decrease tightness and improve pec tightness. Issued supine dowel exercises as part of HEP. Began STM to L chest/pec and gentle MFR to cording. Pt stated she felt better at end of session.    OBJECTIVE IMPAIRMENTS: decreased knowledge of condition,  decreased ROM, decreased strength, increased muscle spasms, impaired UE functional use, postural dysfunction, and pain.   ACTIVITY LIMITATIONS: carrying, lifting, and reach over head  PARTICIPATION LIMITATIONS: cleaning, laundry, and community activity  PERSONAL FACTORS: Past/current experiences and Time since onset of injury/illness/exacerbation are also affecting patient's functional outcome.   REHAB POTENTIAL: Good  CLINICAL DECISION MAKING: Stable/uncomplicated  EVALUATION COMPLEXITY: Low  GOALS: Goals reviewed with patient? Yes  SHORT TERM GOALS: Target date: 03/23/24  Pt will be able to verbalize lymphedema risk reduction practices.  Baseline: Goal status: INITIAL  2.  Pt will be independent in initial home exercise program for stretching and strengthening.  Baseline:  Goal status: INITIAL   LONG TERM GOALS: Target date: 04/06/24  Pt will demonstrate 160 degrees of L shoulder flexion to allow her to reach overhead.  Baseline:  Goal status: INITIAL  2.  Pt will demonstrate 160 degrees of L shoulder abduction to allow her to reach out to the side. Baseline:  Goal status: INITIAL  3.  Pt will report a 75% improvement in feelings of tightness with UE overhead motion to allow improved comfort. Baseline:  Goal status: INITIAL  4.  Pt will be able to raise L arm without increased pain to improve comfort. Baseline:  Goal status: INITIAL  5.  Pt will be independent in a home exercise program for continued stretching and strengthening.  Baseline:  Goal status: INITIAL   PLAN:  PT FREQUENCY: 2x/week  PT DURATION: 4 weeks  PLANNED INTERVENTIONS: 97164- PT Re-evaluation, 97110-Therapeutic exercises, 97530- Therapeutic activity, 97535- Self Care, [id number]- Manual therapy, 97760- Orthotic Initial, [id number]- Orthotic/Prosthetic subsequent, and Manual lymph drainage  PLAN FOR NEXT SESSION: pulleys, ball, foam roll for pec stretch, MFR cording, PROM  Dhriti Fales Breedlove Blue,  PT 03/10/2024, 11:00 AM

## 2024-03-16 ENCOUNTER — Ambulatory Visit

## 2024-03-16 DIAGNOSIS — M25612 Stiffness of left shoulder, not elsewhere classified: Secondary | ICD-10-CM | POA: Diagnosis not present

## 2024-03-16 DIAGNOSIS — Z170 Estrogen receptor positive status [ER+]: Secondary | ICD-10-CM | POA: Diagnosis not present

## 2024-03-16 DIAGNOSIS — R252 Cramp and spasm: Secondary | ICD-10-CM | POA: Diagnosis not present

## 2024-03-16 DIAGNOSIS — L599 Disorder of the skin and subcutaneous tissue related to radiation, unspecified: Secondary | ICD-10-CM | POA: Diagnosis not present

## 2024-03-16 DIAGNOSIS — C50412 Malignant neoplasm of upper-outer quadrant of left female breast: Secondary | ICD-10-CM | POA: Diagnosis not present

## 2024-03-16 NOTE — Therapy (Signed)
 OUTPATIENT PHYSICAL THERAPY  UPPER EXTREMITY ONCOLOGY TREATMENT  Patient Name: Katie [name] MRN: [id number] DOB:02/14/1946, 78 y.o., female Today's Date: 03/16/2024  END OF SESSION:  PT End of Session - 03/16/24 1008     Visit Number 3    Number of Visits 9    Date for PT Re-Evaluation 04/06/24    Authorization Type [employer] approved 1 ero and 8 visits 03/09/24-04/06/24 auth # [id number]    Authorization - Visit Number 3    Authorization - Number of Visits 8    PT Start Time 1003    PT Stop Time 1102    PT Time Calculation (min) 59 min    Activity Tolerance Patient tolerated treatment well    Behavior During Therapy [facility] for tasks assessed/performed          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, small, not treating-watching , it is now stented   Blood transfusion without reported diagnosis    Cancer (HCC)    breast 08/2018   Coronary artery disease    Diverticulitis    DJD (degenerative joint disease)    DM type 2 (diabetes mellitus, type 2) (HCC)    Family history of breast cancer    Fibrocystic breast disease    GERD (gastroesophageal reflux disease)    History of chicken pox    Hypercholesteremia    Hypertension    Lichen planopilaris    Neuromuscular disorder (HCC)    Sleep apnea    cpap   Urinary incontinence    Vertigo 12/2019   1 episode   Past Surgical History:  Procedure Laterality Date   BREAST IMPLANT REMOVAL Bilateral 03/08/2019   Procedure: Removal of Bilateral Breast Implants;  Surgeon: Ebbie Cough, MD;  Location: [facility] OR;  Service: General;  Laterality: Bilateral;   BREAST SURGERY  02/1975   Left Breast Biopsy-Fibrocystic Disease   BREAST SURGERY  10/1981   Bialteral Capsulectomy Breast Surgery   BREAST SURGERY  07/1998   Replace Bilateral Silicone Implants   BREAST SURGERY  01/1976   Bilater breast surgery to remove fibrocystic tissue and replace with silicone implants   CATARACT  EXTRACTION W/PHACO Right 02/08/2020   Procedure: CATARACT EXTRACTION PHACO AND INTRAOCULAR LENS PLACEMENT (IOC) RIGHT DIABETIC;  Surgeon: Jaye Fallow, MD;  Location: [facility];  Service: Ophthalmology;  Laterality: Right;  3.55 0:33.7   CATARACT EXTRACTION W/PHACO Left 02/29/2020   Procedure: CATARACT EXTRACTION PHACO AND INTRAOCULAR LENS PLACEMENT (IOC) LEFT DIABETIC 2.93  00:27.1;  Surgeon: Jaye Fallow, MD;  Location: [facility];  Service: Ophthalmology;  Laterality: Left;  Diabetic - oral meds   CHOLECYSTECTOMY N/A 07/08/2016   Procedure: LAPAROSCOPIC CHOLECYSTECTOMY;  Surgeon: Carlin Pastel, MD;  Location: [facility] ORS;  Service: General;  Laterality: N/A;   COLONOSCOPY WITH PROPOFOL  N/A 11/07/2017   Procedure: COLONOSCOPY WITH PROPOFOL ;  Surgeon: Therisa Bi, MD;  Location: [facility] ENDOSCOPY;  Service: Gastroenterology;  Laterality: N/A;   DILATION AND CURETTAGE OF UTERUS  01/1994   ESOPHAGOGASTRODUODENOSCOPY (EGD) WITH PROPOFOL  N/A 12/05/2016   Procedure: ESOPHAGOGASTRODUODENOSCOPY (EGD) WITH PROPOFOL ;  Surgeon: Bi Therisa, MD;  Location: [facility] ENDOSCOPY;  Service: Endoscopy;  Laterality: N/A;   FINGER SURGERY  2001 & 2003   Trigger Finger   FOOT SURGERY  1980   To Relieve Pinched Nerve   FOOT SURGERY  07/2008   Left Foot-Plantar Fasciitis   JOINT REPLACEMENT  04/19/2013   Hip  Replacement-Right   MASTECTOMY     double   MASTECTOMY W/ SENTINEL NODE BIOPSY Bilateral 03/08/2019   Procedure: BILATERAL MASTECTOMIES WITH LEFT AXILLARY SENTINEL LYMPH NODE BIOPSY AND BLUE DYE INJECTION;  Surgeon: Ebbie Cough, MD;  Location: [facility] OR;  Service: General;  Laterality: Bilateral;   PARTIAL KNEE ARTHROPLASTY Right 12/31/2022   Procedure: RIGHT PARTIAL KNEE ARTHROPLASTY;  Surgeon: Edie Norleen PARAS, MD;  Location: [facility] ORS;  Service: Orthopedics;  Laterality: Right;   PLACEMENT OF BREAST IMPLANTS  12/26/2004   Replace Failed Implants   REPLACEMENT TOTAL KNEE Left    2019-    RHINOPLASTY  03/1964   To Correct Deviated Septum   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/2023   Acute diverticulitis 03/21/2023   Abdominal pain, left lower quadrant 03/16/2023   Burning with urination 03/07/2023   Overweight (BMI 25.0-29.9) 03/05/2022   Elevated ferritin 03/23/2021   Decreased GFR 02/23/2021   Vitamin B 12 deficiency 02/23/2021   Leukopenia 02/23/2021   Bronchiectasis without complication (HCC) 01/26/2020   Aortic atherosclerosis (HCC) 12/23/2019   Atherosclerosis of coronary artery of native heart without angina pectoris 12/23/2019   Peripheral edema 12/22/2019   Chemotherapy-induced peripheral neuropathy (HCC) 10/29/2019   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   Anemia 10/15/2018   Diabetic retinopathy (HCC) 09/29/2018   Family history of leukemia 09/16/2018   Family history of breast cancer    Malignant neoplasm of upper-outer quadrant of left breast in female, 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/11/2018   Family history of breast cancer in sister 08/28/2018   Diverticular disease 10/17/2017   Primary osteoarthritis of right knee 03/07/2017   HSV-1 (herpes simplex virus 1) infection, 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   Vitamin D  deficiency 10/11/2016   Family history of thyroid  disorder 04/04/2015   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   Counseling regarding end of life decision making 10/04/2014   OA (osteoarthritis) of neck 08/02/2014   History of duodenal ulcer 08/02/2014   Seasonal allergies 08/02/2014   Hypertension associated with diabetes (HCC) 08/02/2014   Hyperlipidemia associated with type 2 diabetes mellitus (HCC) 08/02/2014   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    History of joint replacement 03/01/2014   Diabetes mellitus type 2 with retinopathy (HCC) 11/07/2009    PCP: Greig Ring, MD  REFERRING PROVIDER: Kip Lynwood Double, PA-C  REFERRING DIAG: R49.587 L breast cancer/ cording  THERAPY DIAG:  Stiffness of left shoulder, not elsewhere classified  Disorder of the skin and subcutaneous tissue related to radiation, unspecified  Cramp and spasm  Malignant neoplasm of upper-outer quadrant of left breast in female, estrogen receptor positive (HCC)  ONSET DATE: 03/08/2019  Rationale for Evaluation and Treatment: Rehabilitation  SUBJECTIVE:  SUBJECTIVE STATEMENT: I just got the pulleys and will try to hang them up today.   PERTINENT HISTORY: In 2020 she underwent primary chemotherapy for a triple positive left breast cancer. This was followed by bilateral mastectomies and a sentinel lymph node biopsy  March 08 2019. 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 The right mastectomy was negative. The pathologist lost the nodes so there is no pathology on the nodes. This was followed by adjuvant radiation therapy and she is now on anastrozole  doing well. Due to take that for 7 years. SABRA DJD (degenerative joint disease)  DM II (diabetes mellitus, type II), controlled. R partial knee arthroplasty 12/31/2022, L total knee 2019, 04/19/2013 R hip replacement, has stent for cerebral aneurysm   PAIN:  Are you having pain? No pain sitting, hurts 3-4/10 when raising L arm  PRECAUTIONS: Other: L UE lymphedema risk, L total knee 2019, R partial knee 2024, R THA 2014, has stent for cerebral aneurysm  RED FLAGS: None   WEIGHT BEARING RESTRICTIONS: No  FALLS:  Has patient fallen in last 6 months? No  LIVING  ENVIRONMENT: Lives with: lives with their spouse Lives in: House/apartment Stairs: Yes; Internal: 12 steps; can reach both and External: 5 steps; can reach both Has following equipment at home: Grab bars  OCCUPATION: retried  LEISURE: belongs to [employer] 2-4 days/week, does [employer], treadmill, recumbent bike, weight machines  HAND DOMINANCE: right   PRIOR LEVEL OF FUNCTION: Independent  PATIENT GOALS: to loosen the muscle to improve mobility, loosen scar tissue   OBJECTIVE: Note: Objective measures were completed at Evaluation unless otherwise noted.  COGNITION: Overall cognitive status: Within functional limits for tasks assessed   PALPATION: Tightness palpable especially at lateral edge of scar, cording at L axilla  OBSERVATIONS / OTHER ASSESSMENTS: telangiectasias visible at L chest  POSTURE: rounded shoulders, forward head  UPPER EXTREMITY AROM/PROM:  A/PROM RIGHT   eval   Shoulder extension 71  Shoulder flexion 162  Shoulder abduction 150  Shoulder internal rotation 67  Shoulder external rotation 76    (Blank rows = not tested)  A/PROM LEFT   eval  Shoulder extension 50  Shoulder flexion 147 with p!  Shoulder abduction 135  Shoulder internal rotation 55  Shoulder external rotation 79    (Blank rows = not tested)    UPPER EXTREMITY STRENGTH: R 5/5, L 4/5  LYMPHEDEMA ASSESSMENTS:   SURGERY TYPE/DATE: bilateral mastectomy and SLNB 03/08/19  NUMBER OF LYMPH NODES REMOVED: 1 - was lost so no pathology  CHEMOTHERAPY: completed  RADIATION:completed  HORMONE TREATMENT: on anastrozole   INFECTIONS: none   LYMPHEDEMA ASSESSMENTS:   LANDMARK RIGHT  eval  At axilla  34.5  15 cm proximal to olecranon process 31.4  10 cm proximal to olecranon process 28  Olecranon process 24.2  15 cm proximal to ulnar styloid process 23  10 cm proximal to ulnar styloid process 20.9  Just proximal to ulnar styloid process 17.5  Across hand at thumb web  space 18.5  At base of 2nd digit 6.6  (Blank rows = not tested)  LANDMARK LEFT  eval  At axilla  34.5  15 cm proximal to olecranon process 33  10 cm proximal to olecranon process 30.2  Olecranon process 26  15 cm proximal to ulnar styloid process 23.6  10 cm proximal to ulnar styloid process 21  Just proximal to ulnar styloid process 17  Across hand at thumb  web space 18.3  At base of 2nd digit 6.5  (Blank rows = not tested)   QUICK DASH:                                                                                                                                TREATMENT DATE:  03/16/24: Manual Therapy P/ROM of Lt shoulder into flex, abd, and D2 with scapular depression throughout MFR along tight Lt pect tendon and then cross hands technique horizontally across chest and vertically on Lt chest wall STM to Lt lateral trunk at area of palpable tightness  Therapeutic Exercise Pulleys into flex and abd x 2 mins each returning therapist demo Doorway pectoralis stretch x 4 reps, 10 sec holds encouraging pt to hold 20-30 at home and to incorporate this into her day after each bathroom visit Therapeutic Activities Supine over 1/2 foam roll with pt returning therapist demo for 10 reps each: alternating flexion, alternating scaption, horizontal abduction, snow  angels with elbow straight (v/c for all to avoid compensation) Modified downward dog on wall x 5 reps, 5 sec holds returning therapist demo  03/10/24: Therapeutic Exercise: Pulleys x 2 min in direction of  flexion and 2 min in direction of abduction with v/c to avoid scaption and keep elbow straight  Ball up wall x 10 reps in to flexion and 10 reps in to L abduction with v/c for correct form and pt returning therapist demo Supine over 1/2 foam roll with pt returning therapist demo for 10 reps each: alternating flexion, alternating scaption, horizontal abduction, snow angels with elbow straight (v/c for all to avoid  compensation) Therapist demonstrated supine dowel exercises in to flexion and abduction and issued as part of HEP Manual therapy:  in supine with cocoa butter: STM to L pec and L chest to improve tissue mobility and decrease tightness and gentle MFR to cording in L axilla  03/09/24- none today due to time constraints    PATIENT EDUCATION:  Education details: Engineer, drilling Person educated: Patient Education method: Explanation Education comprehension: verbalized understanding  HOME EXERCISE PROGRAM: Supine dowel flexion and abduction x 10 reps each with 5-15 sec holds 03/16/24 - Doorway pectoralis stretch  ASSESSMENT:  CLINICAL IMPRESSION: Continued AA/ROM stretches, added doorway pectoralis stretch to HEP. Continued manual therapy to work on decreasing fascial restrictions on Lt upper quadrant.    OBJECTIVE IMPAIRMENTS: decreased knowledge of condition, decreased ROM, decreased strength, increased muscle spasms, impaired UE functional use, postural dysfunction, and pain.   ACTIVITY LIMITATIONS: carrying, lifting, and reach over head  PARTICIPATION LIMITATIONS: cleaning, laundry, and community activity  PERSONAL FACTORS: Past/current experiences and Time since onset of injury/illness/exacerbation are also affecting patient's functional outcome.   REHAB POTENTIAL: Good  CLINICAL DECISION MAKING: Stable/uncomplicated  EVALUATION COMPLEXITY: Low  GOALS: Goals reviewed with patient? Yes  SHORT TERM GOALS: Target date: 03/23/24  Pt will be able to verbalize lymphedema risk reduction practices.  Baseline: Goal status:  INITIAL  2.  Pt will be independent in initial home exercise program for stretching and strengthening.  Baseline:  Goal status: INITIAL   LONG TERM GOALS: Target date: 04/06/24  Pt will demonstrate 160 degrees of L shoulder flexion to allow her to reach overhead.  Baseline:  Goal status: INITIAL  2.  Pt will demonstrate 160 degrees of L  shoulder abduction to allow her to reach out to the side. Baseline:  Goal status: INITIAL  3.  Pt will report a 75% improvement in feelings of tightness with UE overhead motion to allow improved comfort. Baseline:  Goal status: INITIAL  4.  Pt will be able to raise L arm without increased pain to improve comfort. Baseline:  Goal status: INITIAL  5.  Pt will be independent in a home exercise program for continued stretching and strengthening.  Baseline:  Goal status: INITIAL   PLAN:  PT FREQUENCY: 2x/week  PT DURATION: 4 weeks  PLANNED INTERVENTIONS: 97164- PT Re-evaluation, 97110-Therapeutic exercises, 97530- Therapeutic activity, 97535- Self Care, [id number]- Manual therapy, [id number]- Orthotic Initial, [id number]- Orthotic/Prosthetic subsequent, and Manual lymph drainage  PLAN FOR NEXT SESSION: How are her pulleys at home? Cont pulleys here to assess technique, ball, foam roll for pec stretch, MFR cording, PROM  Aden Berwyn Caldron, PTA 03/16/2024, 11:08 AM   CHEST: Doorway, Bilateral - Standing    Stand with one foot in front of the other. Standing in doorway, place hands on wall with elbows bent at shoulder height. Lean forward. Hold _20-30__ seconds. _2-3__ reps per set, _4__ sets per day  Cancer Rehab [phone]

## 2024-03-17 DIAGNOSIS — M2170 Unequal limb length (acquired), unspecified site: Secondary | ICD-10-CM | POA: Diagnosis not present

## 2024-03-17 DIAGNOSIS — Z96651 Presence of right artificial knee joint: Secondary | ICD-10-CM | POA: Diagnosis not present

## 2024-03-17 DIAGNOSIS — M76891 Other specified enthesopathies of right lower limb, excluding foot: Secondary | ICD-10-CM | POA: Diagnosis not present

## 2024-03-17 DIAGNOSIS — M1612 Unilateral primary osteoarthritis, left hip: Secondary | ICD-10-CM | POA: Diagnosis not present

## 2024-03-17 DIAGNOSIS — S76011D Strain of muscle, fascia and tendon of right hip, subsequent encounter: Secondary | ICD-10-CM | POA: Diagnosis not present

## 2024-03-17 DIAGNOSIS — M7062 Trochanteric bursitis, left hip: Secondary | ICD-10-CM | POA: Diagnosis not present

## 2024-03-18 ENCOUNTER — Ambulatory Visit

## 2024-03-18 DIAGNOSIS — Z170 Estrogen receptor positive status [ER+]: Secondary | ICD-10-CM | POA: Diagnosis not present

## 2024-03-18 DIAGNOSIS — L599 Disorder of the skin and subcutaneous tissue related to radiation, unspecified: Secondary | ICD-10-CM | POA: Diagnosis not present

## 2024-03-18 DIAGNOSIS — R252 Cramp and spasm: Secondary | ICD-10-CM | POA: Diagnosis not present

## 2024-03-18 DIAGNOSIS — C50412 Malignant neoplasm of upper-outer quadrant of left female breast: Secondary | ICD-10-CM | POA: Diagnosis not present

## 2024-03-18 DIAGNOSIS — M25612 Stiffness of left shoulder, not elsewhere classified: Secondary | ICD-10-CM | POA: Diagnosis not present

## 2024-03-18 NOTE — Therapy (Signed)
 OUTPATIENT PHYSICAL THERAPY  UPPER EXTREMITY ONCOLOGY TREATMENT  Patient Name: Katie [name] MRN: [id number] DOB:March 19, 1946, 78 y.o., female Today's Date: 03/18/2024  END OF SESSION:  PT End of Session - 03/18/24 1013     Visit Number 4    Number of Visits 9    Date for PT Re-Evaluation 04/06/24    Authorization Type [employer] approved 1 ero and 8 visits 03/09/24-04/06/24 auth # [id number]    Authorization Time Period auth 20 visits  4/16-6/20  Authorization #[id number]    Authorization - Visit Number 4    Authorization - Number of Visits 8    PT Start Time 1004    PT Stop Time 1103    PT Time Calculation (min) 59 min    Activity Tolerance Patient tolerated treatment well    Behavior During Therapy [facility] for tasks assessed/performed          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, small, not treating-watching , it is now stented   Blood transfusion without reported diagnosis    Cancer (HCC)    breast 08/2018   Coronary artery disease    Diverticulitis    DJD (degenerative joint disease)    DM type 2 (diabetes mellitus, type 2) (HCC)    Family history of breast cancer    Fibrocystic breast disease    GERD (gastroesophageal reflux disease)    History of chicken pox    Hypercholesteremia    Hypertension    Lichen planopilaris    Neuromuscular disorder (HCC)    Sleep apnea    cpap   Urinary incontinence    Vertigo 12/2019   1 episode   Past Surgical History:  Procedure Laterality Date   BREAST IMPLANT REMOVAL Bilateral 03/08/2019   Procedure: Removal of Bilateral Breast Implants;  Surgeon: Ebbie Cough, MD;  Location: [facility] OR;  Service: General;  Laterality: Bilateral;   BREAST SURGERY  02/1975   Left Breast Biopsy-Fibrocystic Disease   BREAST SURGERY  10/1981   Bialteral Capsulectomy Breast Surgery   BREAST SURGERY  07/1998   Replace Bilateral Silicone Implants   BREAST SURGERY  01/1976   Bilater breast surgery  to remove fibrocystic tissue and replace with silicone implants   CATARACT EXTRACTION W/PHACO Right 02/08/2020   Procedure: CATARACT EXTRACTION PHACO AND INTRAOCULAR LENS PLACEMENT (IOC) RIGHT DIABETIC;  Surgeon: Jaye Fallow, MD;  Location: [facility];  Service: Ophthalmology;  Laterality: Right;  3.55 0:33.7   CATARACT EXTRACTION W/PHACO Left 02/29/2020   Procedure: CATARACT EXTRACTION PHACO AND INTRAOCULAR LENS PLACEMENT (IOC) LEFT DIABETIC 2.93  00:27.1;  Surgeon: Jaye Fallow, MD;  Location: [facility];  Service: Ophthalmology;  Laterality: Left;  Diabetic - oral meds   CHOLECYSTECTOMY N/A 07/08/2016   Procedure: LAPAROSCOPIC CHOLECYSTECTOMY;  Surgeon: Carlin Pastel, MD;  Location: [facility] ORS;  Service: General;  Laterality: N/A;   COLONOSCOPY WITH PROPOFOL  N/A 11/07/2017   Procedure: COLONOSCOPY WITH PROPOFOL ;  Surgeon: Therisa Bi, MD;  Location: [facility] ENDOSCOPY;  Service: Gastroenterology;  Laterality: N/A;   DILATION AND CURETTAGE OF UTERUS  01/1994   ESOPHAGOGASTRODUODENOSCOPY (EGD) WITH PROPOFOL  N/A 12/05/2016   Procedure: ESOPHAGOGASTRODUODENOSCOPY (EGD) WITH PROPOFOL ;  Surgeon: Bi Therisa, MD;  Location: [facility] ENDOSCOPY;  Service: Endoscopy;  Laterality: N/A;   FINGER SURGERY  2001 & 2003   Trigger Finger   FOOT SURGERY  1980   To Relieve Pinched Nerve   FOOT SURGERY  07/2008  Left Foot-Plantar Fasciitis   JOINT REPLACEMENT  04/19/2013   Hip Replacement-Right   MASTECTOMY     double   MASTECTOMY W/ SENTINEL NODE BIOPSY Bilateral 03/08/2019   Procedure: BILATERAL MASTECTOMIES WITH LEFT AXILLARY SENTINEL LYMPH NODE BIOPSY AND BLUE DYE INJECTION;  Surgeon: Ebbie Cough, MD;  Location: [facility] OR;  Service: General;  Laterality: Bilateral;   PARTIAL KNEE ARTHROPLASTY Right 12/31/2022   Procedure: RIGHT PARTIAL KNEE ARTHROPLASTY;  Surgeon: Edie Norleen PARAS, MD;  Location: [facility] ORS;  Service: Orthopedics;  Laterality: Right;   PLACEMENT OF BREAST IMPLANTS   12/26/2004   Replace Failed Implants   REPLACEMENT TOTAL KNEE Left    2019-   RHINOPLASTY  03/1964   To Correct Deviated Septum   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/2023   Acute diverticulitis 03/21/2023   Abdominal pain, left lower quadrant 03/16/2023   Burning with urination 03/07/2023   Overweight (BMI 25.0-29.9) 03/05/2022   Elevated ferritin 03/23/2021   Decreased GFR 02/23/2021   Vitamin B 12 deficiency 02/23/2021   Leukopenia 02/23/2021   Bronchiectasis without complication (HCC) 01/26/2020   Aortic atherosclerosis (HCC) 12/23/2019   Atherosclerosis of coronary artery of native heart without angina pectoris 12/23/2019   Peripheral edema 12/22/2019   Chemotherapy-induced peripheral neuropathy (HCC) 10/29/2019   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   Anemia 10/15/2018   Diabetic retinopathy (HCC) 09/29/2018   Family history of leukemia 09/16/2018   Family history of breast cancer    Malignant neoplasm of upper-outer quadrant of left breast in female, 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/11/2018   Family history of breast cancer in sister 08/28/2018   Diverticular disease 10/17/2017   Primary osteoarthritis of right knee 03/07/2017   HSV-1 (herpes simplex virus 1) infection, 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   Vitamin D  deficiency 10/11/2016   Family history of thyroid  disorder 04/04/2015   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   Counseling regarding end of life decision making 10/04/2014   OA (osteoarthritis) of neck 08/02/2014   History of duodenal ulcer 08/02/2014   Seasonal allergies 08/02/2014   Hypertension associated with diabetes (HCC) 08/02/2014   Hyperlipidemia associated with type 2  diabetes mellitus (HCC) 08/02/2014   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   History of joint replacement 03/01/2014   Diabetes mellitus type 2 with retinopathy (HCC) 11/07/2009    PCP: Greig Ring, MD  REFERRING PROVIDER: Kip Lynwood Double, PA-C  REFERRING DIAG: R49.587 L breast cancer/ cording  THERAPY DIAG:  Stiffness of left shoulder, not elsewhere classified  Disorder of the skin and subcutaneous tissue related to radiation, unspecified  Cramp and spasm  Malignant neoplasm of upper-outer quadrant of left breast in female, estrogen receptor positive (HCC)  ONSET DATE: 03/08/2019  Rationale for Evaluation and Treatment: Rehabilitation  SUBJECTIVE:  SUBJECTIVE STATEMENT: With the little bit of physical therapy I've already had I can tell my shoulder is feeling better and moving more.   PERTINENT HISTORY: In 2020 she underwent primary chemotherapy for a triple positive left breast cancer. This was followed by bilateral mastectomies and a sentinel lymph node biopsy  March 08 2019. 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 The right mastectomy was negative. The pathologist lost the nodes so there is no pathology on the nodes. This was followed by adjuvant radiation therapy and she is now on anastrozole  doing well. Due to take that for 7 years. SABRA DJD (degenerative joint disease)  DM II (diabetes mellitus, type II), controlled. R partial knee arthroplasty 12/31/2022, L total knee 2019, 04/19/2013 R hip replacement, has stent for cerebral aneurysm   PAIN:  Are you having pain? No, not currently   PRECAUTIONS: Other: L UE lymphedema risk, L total knee 2019, R partial knee 2024, R THA 2014, has stent for cerebral aneurysm  RED FLAGS: None   WEIGHT  BEARING RESTRICTIONS: No  FALLS:  Has patient fallen in last 6 months? No  LIVING ENVIRONMENT: Lives with: lives with their spouse Lives in: House/apartment Stairs: Yes; Internal: 12 steps; can reach both and External: 5 steps; can reach both Has following equipment at home: Grab bars  OCCUPATION: retried  LEISURE: belongs to [employer] 2-4 days/week, does [employer], treadmill, recumbent bike, weight machines  HAND DOMINANCE: right   PRIOR LEVEL OF FUNCTION: Independent  PATIENT GOALS: to loosen the muscle to improve mobility, loosen scar tissue   OBJECTIVE: Note: Objective measures were completed at Evaluation unless otherwise noted.  COGNITION: Overall cognitive status: Within functional limits for tasks assessed   PALPATION: Tightness palpable especially at lateral edge of scar, cording at L axilla  OBSERVATIONS / OTHER ASSESSMENTS: telangiectasias visible at L chest  POSTURE: rounded shoulders, forward head  UPPER EXTREMITY AROM/PROM:  A/PROM RIGHT   eval   Shoulder extension 71  Shoulder flexion 162  Shoulder abduction 150  Shoulder internal rotation 67  Shoulder external rotation 76    (Blank rows = not tested)  A/PROM LEFT   eval  Shoulder extension 50  Shoulder flexion 147 with p!  Shoulder abduction 135  Shoulder internal rotation 55  Shoulder external rotation 79    (Blank rows = not tested)    UPPER EXTREMITY STRENGTH: R 5/5, L 4/5  LYMPHEDEMA ASSESSMENTS:   SURGERY TYPE/DATE: bilateral mastectomy and SLNB 03/08/19  NUMBER OF LYMPH NODES REMOVED: 1 - was lost so no pathology  CHEMOTHERAPY: completed  RADIATION:completed  HORMONE TREATMENT: on anastrozole   INFECTIONS: none   LYMPHEDEMA ASSESSMENTS:   LANDMARK RIGHT  eval  At axilla  34.5  15 cm proximal to olecranon process 31.4  10 cm proximal to olecranon process 28  Olecranon process 24.2  15 cm proximal to ulnar styloid process 23  10 cm proximal to ulnar styloid  process 20.9  Just proximal to ulnar styloid process 17.5  Across hand at thumb web space 18.5  At base of 2nd digit 6.6  (Blank rows = not tested)  LANDMARK LEFT  eval  At axilla  34.5  15 cm proximal to olecranon process 33  10 cm proximal to olecranon process 30.2  Olecranon process 26  15 cm proximal to ulnar styloid process 23.6  10 cm proximal to ulnar styloid process 21  Just proximal to ulnar styloid process 17  Across  hand at thumb web space 18.3  At base of 2nd digit 6.5  (Blank rows = not tested)   QUICK DASH:                                                                                                                                TREATMENT DATE:  03/18/24: Therapeutic Exercise Roll yellow ball up wall into flex and then Lt UE abd x 10 each returning therapist demo Pulleys into flex and abd x 2 mins each returning therapist demo Doorway Pectoralis stretch x 3 reps, 20 sec holds Therapeutic Activities Modified downward dog on wall x 5 reps, 5 sec holds  Supine over 1/2 foam roll with pt returning therapist demo x 10 reps each: alternating flexion, alternating scaption, horizontal abduction, snow  angels with improved technique today Manual Therapy P/ROM of Lt shoulder into flex, abd, and D2 with scapular depression throughout MFR along tight Lt pect tendon and then cross hands technique horizontally across chest and vertically on Lt chest wall STM to Lt lateral trunk at area of palpable tightness  03/16/24: Manual Therapy P/ROM of Lt shoulder into flex, abd, and D2 with scapular depression throughout MFR along tight Lt pect tendon and then cross hands technique horizontally across chest and vertically on Lt chest wall STM to Lt lateral trunk at area of palpable tightness  Therapeutic Exercise Pulleys into flex and abd x 2 mins each returning therapist demo Doorway pectoralis stretch x 4 reps, 10 sec holds encouraging pt to hold 20-30 at home and to  incorporate this into her day after each bathroom visit Therapeutic Activities Supine over 1/2 foam roll with pt returning therapist demo for 10 reps each: alternating flexion, alternating scaption, horizontal abduction, snow  angels with elbow straight (v/c for all to avoid compensation) Modified downward dog on wall x 5 reps, 5 sec holds returning therapist demo  03/10/24: Therapeutic Exercise: Pulleys x 2 min in direction of  flexion and 2 min in direction of abduction with v/c to avoid scaption and keep elbow straight  Ball up wall x 10 reps in to flexion and 10 reps in to L abduction with v/c for correct form and pt returning therapist demo Supine over 1/2 foam roll with pt returning therapist demo for 10 reps each: alternating flexion, alternating scaption, horizontal abduction, snow angels with elbow straight (v/c for all to avoid compensation) Therapist demonstrated supine dowel exercises in to flexion and abduction and issued as part of HEP Manual therapy:  in supine with cocoa butter: STM to L pec and L chest to improve tissue mobility and decrease tightness and gentle MFR to cording in L axilla     PATIENT EDUCATION:  Education details: Engineer, drilling Person educated: Patient Education method: Explanation Education comprehension: verbalized understanding  HOME EXERCISE PROGRAM: Supine dowel flexion and abduction x 10 reps each with 5-15 sec holds 03/16/24 - Doorway pectoralis stretch  ASSESSMENT:  CLINICAL  IMPRESSION: Pt reports already noticing improvement with her A/ROM of Lt shoulder with reaching for ADLs. Today continued with A/AA/ROM and then manual therapy to cont working on decreasing Lt upper quadrant tightness. Softening of Lt lateral trunk noted today from last session and improved end P/ROM.    OBJECTIVE IMPAIRMENTS: decreased knowledge of condition, decreased ROM, decreased strength, increased muscle spasms, impaired UE functional use, postural  dysfunction, and pain.   ACTIVITY LIMITATIONS: carrying, lifting, and reach over head  PARTICIPATION LIMITATIONS: cleaning, laundry, and community activity  PERSONAL FACTORS: Past/current experiences and Time since onset of injury/illness/exacerbation are also affecting patient's functional outcome.   REHAB POTENTIAL: Good  CLINICAL DECISION MAKING: Stable/uncomplicated  EVALUATION COMPLEXITY: Low  GOALS: Goals reviewed with patient? Yes  SHORT TERM GOALS: Target date: 03/23/24  Pt will be able to verbalize lymphedema risk reduction practices.  Baseline: Goal status: INITIAL  2.  Pt will be independent in initial home exercise program for stretching and strengthening.  Baseline:  Goal status: INITIAL   LONG TERM GOALS: Target date: 04/06/24  Pt will demonstrate 160 degrees of L shoulder flexion to allow her to reach overhead.  Baseline:  Goal status: INITIAL  2.  Pt will demonstrate 160 degrees of L shoulder abduction to allow her to reach out to the side. Baseline:  Goal status: INITIAL  3.  Pt will report a 75% improvement in feelings of tightness with UE overhead motion to allow improved comfort. Baseline:  Goal status: INITIAL  4.  Pt will be able to raise L arm without increased pain to improve comfort. Baseline:  Goal status: INITIAL  5.  Pt will be independent in a home exercise program for continued stretching and strengthening.  Baseline:  Goal status: INITIAL   PLAN:  PT FREQUENCY: 2x/week  PT DURATION: 4 weeks  PLANNED INTERVENTIONS: 97164- PT Re-evaluation, 97110-Therapeutic exercises, 97530- Therapeutic activity, 97535- Self Care, [id number]- Manual therapy, [id number]- Orthotic Initial, [id number]- Orthotic/Prosthetic subsequent, and Manual lymph drainage  PLAN FOR NEXT SESSION: How are her pulleys at home? Cont pulleys here to assess technique, ball, foam roll for pec stretch, MFR cording, PROM  Aden Berwyn Caldron, PTA 03/18/2024, 1:02 PM   CHEST:  Doorway, Bilateral - Standing    Stand with one foot in front of the other. Standing in doorway, place hands on wall with elbows bent at shoulder height. Lean forward. Hold _20-30__ seconds. _2-3__ reps per set, _4__ sets per day  Cancer Rehab [phone]

## 2024-03-22 ENCOUNTER — Ambulatory Visit

## 2024-03-22 DIAGNOSIS — C50412 Malignant neoplasm of upper-outer quadrant of left female breast: Secondary | ICD-10-CM | POA: Diagnosis not present

## 2024-03-22 DIAGNOSIS — L599 Disorder of the skin and subcutaneous tissue related to radiation, unspecified: Secondary | ICD-10-CM | POA: Diagnosis not present

## 2024-03-22 DIAGNOSIS — Z170 Estrogen receptor positive status [ER+]: Secondary | ICD-10-CM | POA: Diagnosis not present

## 2024-03-22 DIAGNOSIS — R252 Cramp and spasm: Secondary | ICD-10-CM

## 2024-03-22 DIAGNOSIS — M25612 Stiffness of left shoulder, not elsewhere classified: Secondary | ICD-10-CM | POA: Diagnosis not present

## 2024-03-22 NOTE — Patient Instructions (Signed)
 Over Head Pull: Narrow and Wide Grip   Cancer Rehab [phone]   On back, knees bent, feet flat, band across thighs, elbows straight but relaxed. Pull hands apart (start). Keeping elbows straight, bring arms up and over head, hands toward floor. Keep pull steady on band. Hold momentarily. Return slowly, keeping pull steady, back to start. Then do same with a wider grip on the band (past shoulder width) Repeat _5-10__ times. Band color __yellow____   Side Pull: Double Arm   On back, knees bent, feet flat. Arms perpendicular to body, shoulder level, elbows straight but relaxed. Pull arms out to sides, elbows straight. Resistance band comes across collarbones, hands toward floor. Hold momentarily. Slowly return to starting position. Repeat _5-10__ times. Band color _yellow____   Sword   On back, knees bent, feet flat, left hand on left hip, right hand above left. Pull right arm DIAGONALLY (hip to shoulder) across chest. Bring right arm along head toward floor. Hold momentarily. Slowly return to starting position. Repeat _5-10__ times. Do with left arm. Band color _yellow_____   Shoulder Rotation: Double Arm   On back, knees bent, feet flat, elbows tucked at sides, bent 90, hands palms up. Pull hands apart and down toward floor, keeping elbows near sides. Hold momentarily. Slowly return to starting position. Repeat _5-10__ times. Band color __yellow____

## 2024-03-22 NOTE — Therapy (Signed)
 OUTPATIENT PHYSICAL THERAPY  UPPER EXTREMITY ONCOLOGY TREATMENT  Patient Name: Katie [name] MRN: [id number] DOB:1946-03-01, 78 y.o., female Today's Date: 03/22/2024  END OF SESSION:  PT End of Session - 03/22/24 1112     Visit Number 5    Number of Visits 9    Date for PT Re-Evaluation 04/06/24    Authorization Type [employer] approved 1 ero and 8 visits 03/09/24-04/06/24 auth # [id number]    Authorization - Visit Number 5    Authorization - Number of Visits 8    Progress Note Due on Visit 20    PT Start Time 1105    PT Stop Time 1203    PT Time Calculation (min) 58 min    Activity Tolerance Patient tolerated treatment well    Behavior During Therapy [facility] for tasks assessed/performed          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, small, not treating-watching , it is now stented   Blood transfusion without reported diagnosis    Cancer (HCC)    breast 08/2018   Coronary artery disease    Diverticulitis    DJD (degenerative joint disease)    DM type 2 (diabetes mellitus, type 2) (HCC)    Family history of breast cancer    Fibrocystic breast disease    GERD (gastroesophageal reflux disease)    History of chicken pox    Hypercholesteremia    Hypertension    Lichen planopilaris    Neuromuscular disorder (HCC)    Sleep apnea    cpap   Urinary incontinence    Vertigo 12/2019   1 episode   Past Surgical History:  Procedure Laterality Date   BREAST IMPLANT REMOVAL Bilateral 03/08/2019   Procedure: Removal of Bilateral Breast Implants;  Surgeon: Ebbie Cough, MD;  Location: [facility] OR;  Service: General;  Laterality: Bilateral;   BREAST SURGERY  02/1975   Left Breast Biopsy-Fibrocystic Disease   BREAST SURGERY  10/1981   Bialteral Capsulectomy Breast Surgery   BREAST SURGERY  07/1998   Replace Bilateral Silicone Implants   BREAST SURGERY  01/1976   Bilater breast surgery to remove fibrocystic tissue and replace with  silicone implants   CATARACT EXTRACTION W/PHACO Right 02/08/2020   Procedure: CATARACT EXTRACTION PHACO AND INTRAOCULAR LENS PLACEMENT (IOC) RIGHT DIABETIC;  Surgeon: Jaye Fallow, MD;  Location: [facility];  Service: Ophthalmology;  Laterality: Right;  3.55 0:33.7   CATARACT EXTRACTION W/PHACO Left 02/29/2020   Procedure: CATARACT EXTRACTION PHACO AND INTRAOCULAR LENS PLACEMENT (IOC) LEFT DIABETIC 2.93  00:27.1;  Surgeon: Jaye Fallow, MD;  Location: [facility];  Service: Ophthalmology;  Laterality: Left;  Diabetic - oral meds   CHOLECYSTECTOMY N/A 07/08/2016   Procedure: LAPAROSCOPIC CHOLECYSTECTOMY;  Surgeon: Carlin Pastel, MD;  Location: [facility] ORS;  Service: General;  Laterality: N/A;   COLONOSCOPY WITH PROPOFOL  N/A 11/07/2017   Procedure: COLONOSCOPY WITH PROPOFOL ;  Surgeon: Therisa Bi, MD;  Location: [facility] ENDOSCOPY;  Service: Gastroenterology;  Laterality: N/A;   DILATION AND CURETTAGE OF UTERUS  01/1994   ESOPHAGOGASTRODUODENOSCOPY (EGD) WITH PROPOFOL  N/A 12/05/2016   Procedure: ESOPHAGOGASTRODUODENOSCOPY (EGD) WITH PROPOFOL ;  Surgeon: Bi Therisa, MD;  Location: [facility] ENDOSCOPY;  Service: Endoscopy;  Laterality: N/A;   FINGER SURGERY  2001 & 2003   Trigger Finger   FOOT SURGERY  1980   To Relieve Pinched Nerve   FOOT SURGERY  07/2008   Left Foot-Plantar Fasciitis  JOINT REPLACEMENT  04/19/2013   Hip Replacement-Right   MASTECTOMY     double   MASTECTOMY W/ SENTINEL NODE BIOPSY Bilateral 03/08/2019   Procedure: BILATERAL MASTECTOMIES WITH LEFT AXILLARY SENTINEL LYMPH NODE BIOPSY AND BLUE DYE INJECTION;  Surgeon: Ebbie Cough, MD;  Location: [facility] OR;  Service: General;  Laterality: Bilateral;   PARTIAL KNEE ARTHROPLASTY Right 12/31/2022   Procedure: RIGHT PARTIAL KNEE ARTHROPLASTY;  Surgeon: Edie Norleen PARAS, MD;  Location: [facility] ORS;  Service: Orthopedics;  Laterality: Right;   PLACEMENT OF BREAST IMPLANTS  12/26/2004   Replace Failed Implants    REPLACEMENT TOTAL KNEE Left    2019-   RHINOPLASTY  03/1964   To Correct Deviated Septum   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/2023   Acute diverticulitis 03/21/2023   Abdominal pain, left lower quadrant 03/16/2023   Burning with urination 03/07/2023   Overweight (BMI 25.0-29.9) 03/05/2022   Elevated ferritin 03/23/2021   Decreased GFR 02/23/2021   Vitamin B 12 deficiency 02/23/2021   Leukopenia 02/23/2021   Bronchiectasis without complication (HCC) 01/26/2020   Aortic atherosclerosis (HCC) 12/23/2019   Atherosclerosis of coronary artery of native heart without angina pectoris 12/23/2019   Peripheral edema 12/22/2019   Chemotherapy-induced peripheral neuropathy (HCC) 10/29/2019   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   Anemia 10/15/2018   Diabetic retinopathy (HCC) 09/29/2018   Family history of leukemia 09/16/2018   Family history of breast cancer    Malignant neoplasm of upper-outer quadrant of left breast in female, 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/11/2018   Family history of breast cancer in sister 08/28/2018   Diverticular disease 10/17/2017   Primary osteoarthritis of right knee 03/07/2017   HSV-1 (herpes simplex virus 1) infection, 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   Vitamin D  deficiency 10/11/2016   Family history of thyroid  disorder 04/04/2015   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   Counseling regarding end of life decision making 10/04/2014   OA (osteoarthritis) of neck 08/02/2014   History of duodenal ulcer 08/02/2014   Seasonal allergies 08/02/2014   Hypertension associated with diabetes (HCC) 08/02/2014   Hyperlipidemia associated with type 2 diabetes mellitus (HCC) 08/02/2014    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   History of joint replacement 03/01/2014   Diabetes mellitus type 2 with retinopathy (HCC) 11/07/2009    PCP: Greig Ring, MD  REFERRING PROVIDER: Kip Lynwood Double, PA-C  REFERRING DIAG: R49.587 L breast cancer/ cording  THERAPY DIAG:  Stiffness of left shoulder, not elsewhere classified  Disorder of the skin and subcutaneous tissue related to radiation, unspecified  Cramp and spasm  Malignant neoplasm of upper-outer quadrant of left breast in female, estrogen receptor positive (HCC)  ONSET DATE: 03/08/2019  Rationale for Evaluation and Treatment: Rehabilitation  SUBJECTIVE:  SUBJECTIVE STATEMENT: I found a place to hang my pulleys at home and I've gotten to do them a few times but I need to get moe consistent. I can still feel the cording but overall my motion is getting better. I can reach [facility] a little further now.   PERTINENT HISTORY: In 2020 she underwent primary chemotherapy for a triple positive left breast cancer. This was followed by bilateral mastectomies and a sentinel lymph node biopsy  March 08 2019. 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 The right mastectomy was negative. The pathologist lost the nodes so there is no pathology on the nodes. This was followed by adjuvant radiation therapy and she is now on anastrozole  doing well. Due to take that for 7 years. SABRA DJD (degenerative joint disease)  DM II (diabetes mellitus, type II), controlled. R partial knee arthroplasty 12/31/2022, L total knee 2019, 04/19/2013 R hip replacement, has stent for cerebral aneurysm   PAIN:  Are you having pain? No, not currently   PRECAUTIONS: Other: L UE lymphedema risk, L total knee 2019, R partial knee 2024, R THA  2014, has stent for cerebral aneurysm  RED FLAGS: None   WEIGHT BEARING RESTRICTIONS: No  FALLS:  Has patient fallen in last 6 months? No  LIVING ENVIRONMENT: Lives with: lives with their spouse Lives in: House/apartment Stairs: Yes; Internal: 12 steps; can reach both and External: 5 steps; can reach both Has following equipment at home: Grab bars  OCCUPATION: retried  LEISURE: belongs to [employer] 2-4 days/week, does [employer], treadmill, recumbent bike, weight machines  HAND DOMINANCE: right   PRIOR LEVEL OF FUNCTION: Independent  PATIENT GOALS: to loosen the muscle to improve mobility, loosen scar tissue   OBJECTIVE: Note: Objective measures were completed at Evaluation unless otherwise noted.  COGNITION: Overall cognitive status: Within functional limits for tasks assessed   PALPATION: Tightness palpable especially at lateral edge of scar, cording at L axilla  OBSERVATIONS / OTHER ASSESSMENTS: telangiectasias visible at L chest  POSTURE: rounded shoulders, forward head  UPPER EXTREMITY AROM/PROM:  A/PROM RIGHT   eval   Shoulder extension 71  Shoulder flexion 162  Shoulder abduction 150  Shoulder internal rotation 67  Shoulder external rotation 76    (Blank rows = not tested)  A/PROM LEFT   eval  Shoulder extension 50  Shoulder flexion 147 with p!  Shoulder abduction 135  Shoulder internal rotation 55  Shoulder external rotation 79    (Blank rows = not tested)    UPPER EXTREMITY STRENGTH: R 5/5, L 4/5  LYMPHEDEMA ASSESSMENTS:   SURGERY TYPE/DATE: bilateral mastectomy and SLNB 03/08/19  NUMBER OF LYMPH NODES REMOVED: 1 - was lost so no pathology  CHEMOTHERAPY: completed  RADIATION:completed  HORMONE TREATMENT: on anastrozole   INFECTIONS: none   LYMPHEDEMA ASSESSMENTS:   LANDMARK RIGHT  eval  At axilla  34.5  15 cm proximal to olecranon process 31.4  10 cm proximal to olecranon process 28  Olecranon process 24.2  15 cm  proximal to ulnar styloid process 23  10 cm proximal to ulnar styloid process 20.9  Just proximal to ulnar styloid process 17.5  Across hand at thumb web space 18.5  At base of 2nd digit 6.6  (Blank rows = not tested)  LANDMARK LEFT  eval  At axilla  34.5  15 cm proximal to olecranon process 33  10 cm proximal to olecranon process 30.2  Olecranon process 26  15  cm proximal to ulnar styloid process 23.6  10 cm proximal to ulnar styloid process 21  Just proximal to ulnar styloid process 17  Across hand at thumb web space 18.3  At base of 2nd digit 6.5  (Blank rows = not tested)   QUICK DASH:                                                                                                                                TREATMENT DATE:  03/22/24: Therapeutic Exercise Pulleys into flex and abd x 2 mins each returning therapist demo with VC's throughout to decrease Lt scap compensation, some improvement noted with this today Roll yellow ball up wall into flex and then Lt UE abd x 10 each returning therapist demo Therapeutic Activities Modified downward dog on wall x 5 reps, 5 sec holds returning therapist demo Supine over 1/2 foam roll x 10 reps each: Bil UE horizontal abduction, alternating flexion, Bil UE scaption into a V, and then snow  angels with improved ROM Instructed pt in supine scapular series with yellow theraband returning therapist demo for each of following: Bil horz abd, bil er, narrow and wide grip flexion and then Rt and Lt D2 x 5 each, handout issued to add to HEP Manual Therapy P/ROM of Lt shoulder into flex, abd, and D2 with scapular depression throughout by therapist MFR along tight Lt pect tendon STM in Rt S/L to Lt medial scap border for trigger point release and UT Scap Mobs into protraction and retraction when in S/L  03/18/24: Therapeutic Exercise Roll yellow ball up wall into flex and then Lt UE abd x 10 each returning therapist demo Pulleys into flex  and abd x 2 mins each returning therapist demo Doorway Pectoralis stretch x 3 reps, 20 sec holds Therapeutic Activities Modified downward dog on wall x 5 reps, 5 sec holds  Supine over 1/2 foam roll with pt returning therapist demo x 10 reps each: alternating flexion, alternating scaption, horizontal abduction, snow  angels with improved technique today Manual Therapy P/ROM of Lt shoulder into flex, abd, and D2 with scapular depression throughout MFR along tight Lt pect tendon and then cross hands technique horizontally across chest and vertically on Lt chest wall STM to Lt lateral trunk at area of palpable tightness  03/16/24: Manual Therapy P/ROM of Lt shoulder into flex, abd, and D2 with scapular depression throughout MFR along tight Lt pect tendon and then cross hands technique horizontally across chest and vertically on Lt chest wall STM to Lt lateral trunk at area of palpable tightness  Therapeutic Exercise Pulleys into flex and abd x 2 mins each returning therapist demo Doorway pectoralis stretch x 4 reps, 10 sec holds encouraging pt to hold 20-30 at home and to incorporate this into her day after each bathroom visit Therapeutic Activities Supine over 1/2 foam roll with pt returning therapist demo for 10 reps each: alternating flexion, alternating scaption, horizontal abduction,  snow  angels with elbow straight (v/c for all to avoid compensation) Modified downward dog on wall x 5 reps, 5 sec holds returning therapist demo     PATIENT EDUCATION:  Education details: Supine Scap Series with yellow theraband Person educated: Patient Education method: Explanation, Demonstration, VC's and handout issued Education comprehension: verbalized understanding, returned demonstration, tactile and VC's and pt will benefit from further review  HOME EXERCISE PROGRAM: Supine dowel flexion and abduction x 10 reps each with 5-15 sec holds 03/16/24 - Doorway pectoralis stretch 03/22/24 - supine  scap series with yellow theraband  ASSESSMENT:  CLINICAL IMPRESSION: Continued with AA/A/ROM stretches. Pt conts to require VC's to decrease Lt scap compensation with pulleys but does show improvement with this and improved awareness. Progressed HEP to include supine scapular series with yellow theraband for postural strength. Then continued with manual therapy working to cont decreasing Lt upper quadrant tightness.    OBJECTIVE IMPAIRMENTS: decreased knowledge of condition, decreased ROM, decreased strength, increased muscle spasms, impaired UE functional use, postural dysfunction, and pain.   ACTIVITY LIMITATIONS: carrying, lifting, and reach over head  PARTICIPATION LIMITATIONS: cleaning, laundry, and community activity  PERSONAL FACTORS: Past/current experiences and Time since onset of injury/illness/exacerbation are also affecting patient's functional outcome.   REHAB POTENTIAL: Good  CLINICAL DECISION MAKING: Stable/uncomplicated  EVALUATION COMPLEXITY: Low  GOALS: Goals reviewed with patient? Yes  SHORT TERM GOALS: Target date: 03/23/24  Pt will be able to verbalize lymphedema risk reduction practices.  Baseline: Goal status: INITIAL  2.  Pt will be independent in initial home exercise program for stretching and strengthening.  Baseline:  Goal status: INITIAL   LONG TERM GOALS: Target date: 04/06/24  Pt will demonstrate 160 degrees of L shoulder flexion to allow her to reach overhead.  Baseline:  Goal status: INITIAL  2.  Pt will demonstrate 160 degrees of L shoulder abduction to allow her to reach out to the side. Baseline:  Goal status: INITIAL  3.  Pt will report a 75% improvement in feelings of tightness with UE overhead motion to allow improved comfort. Baseline:  Goal status: INITIAL  4.  Pt will be able to raise L arm without increased pain to improve comfort. Baseline:  Goal status: INITIAL  5.  Pt will be independent in a home exercise program for  continued stretching and strengthening.  Baseline:  Goal status: INITIAL   PLAN:  PT FREQUENCY: 2x/week  PT DURATION: 4 weeks  PLANNED INTERVENTIONS: 97164- PT Re-evaluation, 97110-Therapeutic exercises, 97530- Therapeutic activity, 97535- Self Care, [id number]- Manual therapy, 97760- Orthotic Initial, [phone]- Orthotic/Prosthetic subsequent, and Manual lymph drainage  PLAN FOR NEXT SESSION: Cont pulleys here to assess technique prn, ball, foam roll for pec stretch, MFR cording, AA/A/P/ROM; review supine scap series and red theraband when pt ready for progression  Aden Berwyn Caldron, PTA 03/22/2024, 12:13 PM   CHEST: Doorway, Bilateral - Standing    Stand with one foot in front of the other. Standing in doorway, place hands on wall with elbows bent at shoulder height. Lean forward. Hold _20-30__ seconds. _2-3__ reps per set, _4__ sets per day  Cancer Rehab [phone]

## 2024-03-24 DIAGNOSIS — R1030 Lower abdominal pain, unspecified: Secondary | ICD-10-CM | POA: Diagnosis not present

## 2024-03-24 DIAGNOSIS — R110 Nausea: Secondary | ICD-10-CM | POA: Diagnosis not present

## 2024-03-25 ENCOUNTER — Ambulatory Visit

## 2024-03-25 DIAGNOSIS — M25612 Stiffness of left shoulder, not elsewhere classified: Secondary | ICD-10-CM

## 2024-03-25 DIAGNOSIS — R252 Cramp and spasm: Secondary | ICD-10-CM | POA: Diagnosis not present

## 2024-03-25 DIAGNOSIS — C50412 Malignant neoplasm of upper-outer quadrant of left female breast: Secondary | ICD-10-CM | POA: Diagnosis not present

## 2024-03-25 DIAGNOSIS — Z170 Estrogen receptor positive status [ER+]: Secondary | ICD-10-CM | POA: Diagnosis not present

## 2024-03-25 DIAGNOSIS — L599 Disorder of the skin and subcutaneous tissue related to radiation, unspecified: Secondary | ICD-10-CM

## 2024-03-25 NOTE — Therapy (Signed)
 OUTPATIENT PHYSICAL THERAPY  UPPER EXTREMITY ONCOLOGY TREATMENT  Patient Name: Katie [name] MRN: [id number] DOB:12/18/1945, 78 y.o., female Today's Date: 03/25/2024  END OF SESSION:  PT End of Session - 03/25/24 1007     Visit Number 6    Number of Visits 9    Date for PT Re-Evaluation 04/06/24    Authorization Type [employer] approved 1 ero and 8 visits 03/09/24-04/06/24 auth # [id number]    Authorization - Visit Number 6    Authorization - Number of Visits 8    Progress Note Due on Visit 20    PT Start Time 1004    PT Stop Time 1102    PT Time Calculation (min) 58 min    Activity Tolerance Patient tolerated treatment well    Behavior During Therapy [facility] for tasks assessed/performed          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, small, not treating-watching , it is now stented   Blood transfusion without reported diagnosis    Cancer (HCC)    breast 08/2018   Coronary artery disease    Diverticulitis    DJD (degenerative joint disease)    DM type 2 (diabetes mellitus, type 2) (HCC)    Family history of breast cancer    Fibrocystic breast disease    GERD (gastroesophageal reflux disease)    History of chicken pox    Hypercholesteremia    Hypertension    Lichen planopilaris    Neuromuscular disorder (HCC)    Sleep apnea    cpap   Urinary incontinence    Vertigo 12/2019   1 episode   Past Surgical History:  Procedure Laterality Date   BREAST IMPLANT REMOVAL Bilateral 03/08/2019   Procedure: Removal of Bilateral Breast Implants;  Surgeon: Ebbie Cough, MD;  Location: [facility] OR;  Service: General;  Laterality: Bilateral;   BREAST SURGERY  02/1975   Left Breast Biopsy-Fibrocystic Disease   BREAST SURGERY  10/1981   Bialteral Capsulectomy Breast Surgery   BREAST SURGERY  07/1998   Replace Bilateral Silicone Implants   BREAST SURGERY  01/1976   Bilater breast surgery to remove fibrocystic tissue and replace with  silicone implants   CATARACT EXTRACTION W/PHACO Right 02/08/2020   Procedure: CATARACT EXTRACTION PHACO AND INTRAOCULAR LENS PLACEMENT (IOC) RIGHT DIABETIC;  Surgeon: Jaye Fallow, MD;  Location: [facility];  Service: Ophthalmology;  Laterality: Right;  3.55 0:33.7   CATARACT EXTRACTION W/PHACO Left 02/29/2020   Procedure: CATARACT EXTRACTION PHACO AND INTRAOCULAR LENS PLACEMENT (IOC) LEFT DIABETIC 2.93  00:27.1;  Surgeon: Jaye Fallow, MD;  Location: [facility];  Service: Ophthalmology;  Laterality: Left;  Diabetic - oral meds   CHOLECYSTECTOMY N/A 07/08/2016   Procedure: LAPAROSCOPIC CHOLECYSTECTOMY;  Surgeon: Carlin Pastel, MD;  Location: [facility] ORS;  Service: General;  Laterality: N/A;   COLONOSCOPY WITH PROPOFOL  N/A 11/07/2017   Procedure: COLONOSCOPY WITH PROPOFOL ;  Surgeon: Therisa Bi, MD;  Location: [facility];  Service: Gastroenterology;  Laterality: N/A;   DILATION AND CURETTAGE OF UTERUS  01/1994   ESOPHAGOGASTRODUODENOSCOPY (EGD) WITH PROPOFOL  N/A 12/05/2016   Procedure: ESOPHAGOGASTRODUODENOSCOPY (EGD) WITH PROPOFOL ;  Surgeon: Bi Therisa, MD;  Location: [facility] END[facility]COPY;  Service: Endoscopy;  Laterality: N/A;   FINGER SURGERY  2001 & 2003   Trigger Finger   FOOT SURGERY  1980   To Relieve Pinched Nerve   FOOT SURGERY  07/2008   Left Foot-Plantar Fasciitis  JOINT REPLACEMENT  04/19/2013   Hip Replacement-Right   MASTECTOMY     double   MASTECTOMY W/ SENTINEL NODE BIOPSY Bilateral 03/08/2019   Procedure: BILATERAL MASTECTOMIES WITH LEFT AXILLARY SENTINEL LYMPH NODE BIOPSY AND BLUE DYE INJECTION;  Surgeon: Ebbie Cough, MD;  Location: [facility] OR;  Service: General;  Laterality: Bilateral;   PARTIAL KNEE ARTHROPLASTY Right 12/31/2022   Procedure: RIGHT PARTIAL KNEE ARTHROPLASTY;  Surgeon: Edie Norleen PARAS, MD;  Location: [facility] ORS;  Service: Orthopedics;  Laterality: Right;   PLACEMENT OF BREAST IMPLANTS  12/26/2004   Replace Failed Implants    REPLACEMENT TOTAL KNEE Left    2019-   RHINOPLASTY  03/1964   To Correct Deviated Septum   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/2023   Acute diverticulitis 03/21/2023   Abdominal pain, left lower quadrant 03/16/2023   Burning with urination 03/07/2023   Overweight (BMI 25.0-29.9) 03/05/2022   Elevated ferritin 03/23/2021   Decreased GFR 02/23/2021   Vitamin B 12 deficiency 02/23/2021   Leukopenia 02/23/2021   Bronchiectasis without complication (HCC) 01/26/2020   Aortic atherosclerosis (HCC) 12/23/2019   Atherosclerosis of coronary artery of native heart without angina pectoris 12/23/2019   Peripheral edema 12/22/2019   Chemotherapy-induced peripheral neuropathy (HCC) 10/29/2019   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   Anemia 10/15/2018   Diabetic retinopathy (HCC) 09/29/2018   Family history of leukemia 09/16/2018   Family history of breast cancer    Malignant neoplasm of upper-outer quadrant of left breast in female, 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/11/2018   Family history of breast cancer in sister 08/28/2018   Diverticular disease 10/17/2017   Primary osteoarthritis of right knee 03/07/2017   HSV-1 (herpes simplex virus 1) infection, 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   Vitamin D  deficiency 10/11/2016   Family history of thyroid  disorder 04/04/2015   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   Counseling regarding end of life decision making 10/04/2014   OA (osteoarthritis) of neck 08/02/2014   History of duodenal ulcer 08/02/2014   Seasonal allergies 08/02/2014   Hypertension associated with diabetes (HCC) 08/02/2014   Hyperlipidemia associated with type 2 diabetes mellitus (HCC) 08/02/2014    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   History of joint replacement 03/01/2014   Diabetes mellitus type 2 with retinopathy (HCC) 11/07/2009    PCP: Greig Ring, MD  REFERRING PROVIDER: Kip Lynwood Double, PA-C  REFERRING DIAG: R49.587 L breast cancer/ cording  THERAPY DIAG:  Stiffness of left shoulder, not elsewhere classified  Disorder of the skin and subcutaneous tissue related to radiation, unspecified  Cramp and spasm  Malignant neoplasm of upper-outer quadrant of left breast in female, estrogen receptor positive (HCC)  ONSET DATE: 03/08/2019  Rationale for Evaluation and Treatment: Rehabilitation  SUBJECTIVE:  SUBJECTIVE STATEMENT: I had a flare up of my diverticulitis yesterday and they gave me an antibiotic which always helps. So that's helping me a feel a little better this morning. I've done my pulleys some and that's going ok.   PERTINENT HISTORY: In 2020 she underwent primary chemotherapy for a triple positive left breast cancer. This was followed by bilateral mastectomies and a sentinel lymph node biopsy  March 08 2019. 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 The right mastectomy was negative. The pathologist lost the nodes so there is no pathology on the nodes. This was followed by adjuvant radiation therapy and she is now on anastrozole  doing well. Due to take that for 7 years. SABRA DJD (degenerative joint disease)  DM II (diabetes mellitus, type II), controlled. R partial knee arthroplasty 12/31/2022, L total knee 2019, 04/19/2013 R hip replacement, has stent for cerebral aneurysm   PAIN:  Are you having pain? No, not currently   PRECAUTIONS: Other: L UE lymphedema risk, L total knee 2019, R partial knee 2024, R THA 2014, has stent for  cerebral aneurysm  RED FLAGS: None   WEIGHT BEARING RESTRICTIONS: No  FALLS:  Has patient fallen in last 6 months? No  LIVING ENVIRONMENT: Lives with: lives with their spouse Lives in: House/apartment Stairs: Yes; Internal: 12 steps; can reach both and External: 5 steps; can reach both Has following equipment at home: Grab bars  OCCUPATION: retried  LEISURE: belongs to [employer] 2-4 days/week, does Nu[employer], treadmill, recumbent bike, weight machines  HAND DOMINANCE: right   PRIOR LEVEL OF FUNCTION: Independent  PATIENT GOALS: to loosen the muscle to improve mobility, loosen scar tissue   OBJECTIVE: Note: Objective measures were completed at Evaluation unless otherwise noted.  COGNITION: Overall cognitive status: Within functional limits for tasks assessed   PALPATION: Tightness palpable especially at lateral edge of scar, cording at L axilla  OBSERVATIONS / OTHER ASSESSMENTS: telangiectasias visible at L chest  POSTURE: rounded shoulders, forward head  UPPER EXTREMITY AROM/PROM:  A/PROM RIGHT   eval   Shoulder extension 71  Shoulder flexion 162  Shoulder abduction 150  Shoulder internal rotation 67  Shoulder external rotation 76    (Blank rows = not tested)  A/PROM LEFT   eval  Shoulder extension 50  Shoulder flexion 147 with p!  Shoulder abduction 135  Shoulder internal rotation 55  Shoulder external rotation 79    (Blank rows = not tested)    UPPER EXTREMITY STRENGTH: R 5/5, L 4/5  LYMPHEDEMA ASSESSMENTS:   SURGERY TYPE/DATE: bilateral mastectomy and SLNB 03/08/19  NUMBER OF LYMPH NODES REMOVED: 1 - was lost so no pathology  CHEMOTHERAPY: completed  RADIATION:completed  HORMONE TREATMENT: on anastrozole   INFECTIONS: none   LYMPHEDEMA ASSESSMENTS:   LANDMARK RIGHT  eval  At axilla  34.5  15 cm proximal to olecranon process 31.4  10 cm proximal to olecranon process 28  Olecranon process 24.2  15 cm proximal to ulnar styloid  process 23  10 cm proximal to ulnar styloid process 20.9  Just proximal to ulnar styloid process 17.5  Across hand at thumb web space 18.5  At base of 2nd digit 6.6  (Blank rows = not tested)  LANDMARK LEFT  eval  At axilla  34.5  15 cm proximal to olecranon process 33  10 cm proximal to olecranon process 30.2  Olecranon process 26  15 cm proximal to ulnar styloid process 23.6  10  cm proximal to ulnar styloid process 21  Just proximal to ulnar styloid process 17  Across hand at thumb web space 18.3  At base of 2nd digit 6.5  (Blank rows = not tested)   QUICK DASH:                                                                                                                                TREATMENT DATE:  03/25/24: Therapeutic Exercise Pulleys into flex and abd x 2 mins each returning therapist demo with VC's throughout to decrease Lt scap compensation, some improvement noted with this today Roll yellow ball up wall into flex and then Lt UE abd x 10 each returning therapist demo Decided not to do foam roll today as pt is just getting over flare up of diverticulitis and did not want to overstretch core.  Manual Therapy P/ROM of Lt shoulder into flex, abd, and D2 with scapular depression throughout by therapist MFR along tight Lt pect tendon STM in Rt S/L to Lt medial scap border for trigger point release and UT with cocoa butter Scap Mobs into protraction and retraction when in S/L  03/22/24: Therapeutic Exercise Pulleys into flex and abd x 2 mins each returning therapist demo with VC's throughout to decrease Lt scap compensation, some improvement noted with this today Roll yellow ball up wall into flex and then Lt UE abd x 10 each returning therapist demo Therapeutic Activities Modified downward dog on wall x 5 reps, 5 sec holds returning therapist demo Supine over 1/2 foam roll x 10 reps each: Bil UE horizontal abduction, alternating flexion, Bil UE scaption into a V,  and then snow  angels with improved ROM Instructed pt in supine scapular series with yellow theraband returning therapist demo for each of following: Bil horz abd, bil er, narrow and wide grip flexion and then Rt and Lt D2 x 5 each, handout issued to add to HEP Manual Therapy P/ROM of Lt shoulder into flex, abd, and D2 with scapular depression throughout by therapist MFR along tight Lt pect tendon STM in Rt S/L to Lt medial scap border for trigger point release and UT Scap Mobs into protraction and retraction when in S/L  03/18/24: Therapeutic Exercise Roll yellow ball up wall into flex and then Lt UE abd x 10 each returning therapist demo Pulleys into flex and abd x 2 mins each returning therapist demo Doorway Pectoralis stretch x 3 reps, 20 sec holds Therapeutic Activities Modified downward dog on wall x 5 reps, 5 sec holds  Supine over 1/2 foam roll with pt returning therapist demo x 10 reps each: alternating flexion, alternating scaption, horizontal abduction, snow  angels with improved technique today Manual Therapy P/ROM of Lt shoulder into flex, abd, and D2 with scapular depression throughout MFR along tight Lt pect tendon and then cross hands technique horizontally across chest and vertically on Lt chest wall STM to Lt lateral trunk at area  of palpable tightness  03/16/24: Manual Therapy P/ROM of Lt shoulder into flex, abd, and D2 with scapular depression throughout MFR along tight Lt pect tendon and then cross hands technique horizontally across chest and vertically on Lt chest wall STM to Lt lateral trunk at area of palpable tightness  Therapeutic Exercise Pulleys into flex and abd x 2 mins each returning therapist demo Doorway pectoralis stretch x 4 reps, 10 sec holds encouraging pt to hold 20-30 at home and to incorporate this into her day after each bathroom visit Therapeutic Activities Supine over 1/2 foam roll with pt returning therapist demo for 10 reps each: alternating  flexion, alternating scaption, horizontal abduction, snow  angels with elbow straight (v/c for all to avoid compensation) Modified downward dog on wall x 5 reps, 5 sec holds returning therapist demo     PATIENT EDUCATION:  Education details: Supine Scap Series with yellow theraband Person educated: Patient Education method: Explanation, Demonstration, VC's and handout issued Education comprehension: verbalized understanding, returned demonstration, tactile and VC's and pt will benefit from further review  HOME EXERCISE PROGRAM: Supine dowel flexion and abduction x 10 reps each with 5-15 sec holds 03/16/24 - Doorway pectoralis stretch 03/22/24 - supine scap series with yellow theraband  ASSESSMENT:  CLINICAL IMPRESSION: Continued with AA/ROM stretches but limited other exs today as pt repots having flare up of her diverticulitis yesterday and wanted to take it easy. Spent rest of session working on MT to decrease Lt upper quadrant tightness.    OBJECTIVE IMPAIRMENTS: decreased knowledge of condition, decreased ROM, decreased strength, increased muscle spasms, impaired UE functional use, postural dysfunction, and pain.   ACTIVITY LIMITATIONS: carrying, lifting, and reach over head  PARTICIPATION LIMITATIONS: cleaning, laundry, and community activity  PERSONAL FACTORS: Past/current experiences and Time since onset of injury/illness/exacerbation are also affecting patient's functional outcome.   REHAB POTENTIAL: Good  CLINICAL DECISION MAKING: Stable/uncomplicated  EVALUATION COMPLEXITY: Low  GOALS: Goals reviewed with patient? Yes  SHORT TERM GOALS: Target date: 03/23/24  Pt will be able to verbalize lymphedema risk reduction practices.  Baseline: Goal status: INITIAL  2.  Pt will be independent in initial home exercise program for stretching and strengthening.  Baseline:  Goal status: INITIAL   LONG TERM GOALS: Target date: 04/06/24  Pt will demonstrate 160 degrees of  L shoulder flexion to allow her to reach overhead.  Baseline:  Goal status: INITIAL  2.  Pt will demonstrate 160 degrees of L shoulder abduction to allow her to reach out to the side. Baseline:  Goal status: INITIAL  3.  Pt will report a 75% improvement in feelings of tightness with UE overhead motion to allow improved comfort. Baseline:  Goal status: INITIAL  4.  Pt will be able to raise L arm without increased pain to improve comfort. Baseline:  Goal status: INITIAL  5.  Pt will be independent in a home exercise program for continued stretching and strengthening.  Baseline:  Goal status: INITIAL   PLAN:  PT FREQUENCY: 2x/week  PT DURATION: 4 weeks  PLANNED INTERVENTIONS: 97164- PT Re-evaluation, 97110-Therapeutic exercises, 97530- Therapeutic activity, 97535- Self Care, [id number]- Manual therapy, 97760- Orthotic Initial, [phone]- Orthotic/Prosthetic subsequent, and Manual lymph drainage  PLAN FOR NEXT SESSION: Cont pulleys here to assess technique prn, ball, foam roll for pec stretch, MFR cording, AA/A/P/ROM; review supine scap series and red theraband when pt ready for progression  Aden Berwyn Caldron, PTA 03/25/2024, 11:06 AM   CHEST: Doorway, Bilateral - Standing    Stand  with one foot in front of the other. Standing in doorway, place hands on wall with elbows bent at shoulder height. Lean forward. Hold _20-30__ seconds. _2-3__ reps per set, _4__ sets per day  Cancer Rehab [phone]

## 2024-03-30 ENCOUNTER — Ambulatory Visit: Attending: General Surgery | Admitting: Physical Therapy

## 2024-03-30 ENCOUNTER — Encounter: Payer: Self-pay | Admitting: Physical Therapy

## 2024-03-30 DIAGNOSIS — M25612 Stiffness of left shoulder, not elsewhere classified: Secondary | ICD-10-CM | POA: Insufficient documentation

## 2024-03-30 DIAGNOSIS — R252 Cramp and spasm: Secondary | ICD-10-CM | POA: Insufficient documentation

## 2024-03-30 DIAGNOSIS — Z170 Estrogen receptor positive status [ER+]: Secondary | ICD-10-CM | POA: Diagnosis not present

## 2024-03-30 DIAGNOSIS — C50412 Malignant neoplasm of upper-outer quadrant of left female breast: Secondary | ICD-10-CM | POA: Diagnosis not present

## 2024-03-30 DIAGNOSIS — L599 Disorder of the skin and subcutaneous tissue related to radiation, unspecified: Secondary | ICD-10-CM | POA: Insufficient documentation

## 2024-03-30 NOTE — Therapy (Signed)
 OUTPATIENT PHYSICAL THERAPY  UPPER EXTREMITY ONCOLOGY TREATMENT  Patient Name: Katie [name] MRN: [id number] DOB:03/18/1946, 78 y.o., female Today's Date: 03/30/2024  END OF SESSION:  PT End of Session - 03/30/24 0904     Visit Number 7    Number of Visits 9    Date for PT Re-Evaluation 04/06/24    Authorization Type [employer] approved 1 ero and 8 visits 03/09/24-04/06/24 auth # [id number]    Authorization Time Period auth 20 visits  4/16-6/20  Authorization #[id number]    Authorization - Visit Number 7    Authorization - Number of Visits 8    PT Start Time 0901    PT Stop Time 0957    PT Time Calculation (min) 56 min    Activity Tolerance Patient tolerated treatment well    Behavior During Therapy [facility] for tasks assessed/performed          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, small, not treating-watching , it is now stented   Blood transfusion without reported diagnosis    Cancer (HCC)    breast 08/2018   Coronary artery disease    Diverticulitis    DJD (degenerative joint disease)    DM type 2 (diabetes mellitus, type 2) (HCC)    Family history of breast cancer    Fibrocystic breast disease    GERD (gastroesophageal reflux disease)    History of chicken pox    Hypercholesteremia    Hypertension    Lichen planopilaris    Neuromuscular disorder (HCC)    Sleep apnea    cpap   Urinary incontinence    Vertigo 12/2019   1 episode   Past Surgical History:  Procedure Laterality Date   BREAST IMPLANT REMOVAL Bilateral 03/08/2019   Procedure: Removal of Bilateral Breast Implants;  Surgeon: Ebbie Cough, MD;  Location: [facility] OR;  Service: General;  Laterality: Bilateral;   BREAST SURGERY  02/1975   Left Breast Biopsy-Fibrocystic Disease   BREAST SURGERY  10/1981   Bialteral Capsulectomy Breast Surgery   BREAST SURGERY  07/1998   Replace Bilateral Silicone Implants   BREAST SURGERY  01/1976   Bilater breast surgery  to remove fibrocystic tissue and replace with silicone implants   CATARACT EXTRACTION W/PHACO Right 02/08/2020   Procedure: CATARACT EXTRACTION PHACO AND INTRAOCULAR LENS PLACEMENT (IOC) RIGHT DIABETIC;  Surgeon: Jaye Fallow, MD;  Location: [facility];  Service: Ophthalmology;  Laterality: Right;  3.55 0:33.7   CATARACT EXTRACTION W/PHACO Left 02/29/2020   Procedure: CATARACT EXTRACTION PHACO AND INTRAOCULAR LENS PLACEMENT (IOC) LEFT DIABETIC 2.93  00:27.1;  Surgeon: Jaye Fallow, MD;  Location: [facility];  Service: Ophthalmology;  Laterality: Left;  Diabetic - oral meds   CHOLECYSTECTOMY N/A 07/08/2016   Procedure: LAPAROSCOPIC CHOLECYSTECTOMY;  Surgeon: Carlin Pastel, MD;  Location: [facility] ORS;  Service: General;  Laterality: N/A;   COLONOSCOPY WITH PROPOFOL  N/A 11/07/2017   Procedure: COLONOSCOPY WITH PROPOFOL ;  Surgeon: Therisa Bi, MD;  Location: [facility] END[facility]COPY;  Service: Gastroenterology;  Laterality: N/A;   DILATION AND CURETTAGE OF UTERUS  01/1994   ESOPHAGOGASTRODUODENOSCOPY (EGD) WITH PROPOFOL  N/A 12/05/2016   Procedure: ESOPHAGOGASTRODUODENOSCOPY (EGD) WITH PROPOFOL ;  Surgeon: Bi Therisa, MD;  Location: [facility] ENDOSCOPY;  Service: Endoscopy;  Laterality: N/A;   FINGER SURGERY  2001 & 2003   Trigger Finger   FOOT SURGERY  1980   To Relieve Pinched Nerve   FOOT SURGERY  07/2008  Left Foot-Plantar Fasciitis   JOINT REPLACEMENT  04/19/2013   Hip Replacement-Right   MASTECTOMY     double   MASTECTOMY W/ SENTINEL NODE BIOPSY Bilateral 03/08/2019   Procedure: BILATERAL MASTECTOMIES WITH LEFT AXILLARY SENTINEL LYMPH NODE BIOPSY AND BLUE DYE INJECTION;  Surgeon: Ebbie Cough, MD;  Location: [facility] OR;  Service: General;  Laterality: Bilateral;   PARTIAL KNEE ARTHROPLASTY Right 12/31/2022   Procedure: RIGHT PARTIAL KNEE ARTHROPLASTY;  Surgeon: Edie Norleen PARAS, MD;  Location: [facility] ORS;  Service: Orthopedics;  Laterality: Right;   PLACEMENT OF BREAST IMPLANTS   12/26/2004   Replace Failed Implants   REPLACEMENT TOTAL KNEE Left    2019-   RHINOPLASTY  03/1964   To Correct Deviated Septum   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/2023   Acute diverticulitis 03/21/2023   Abdominal pain, left lower quadrant 03/16/2023   Burning with urination 03/07/2023   Overweight (BMI 25.0-29.9) 03/05/2022   Elevated ferritin 03/23/2021   Decreased GFR 02/23/2021   Vitamin B 12 deficiency 02/23/2021   Leukopenia 02/23/2021   Bronchiectasis without complication (HCC) 01/26/2020   Aortic atherosclerosis (HCC) 12/23/2019   Atherosclerosis of coronary artery of native heart without angina pectoris 12/23/2019   Peripheral edema 12/22/2019   Chemotherapy-induced peripheral neuropathy (HCC) 10/29/2019   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   Anemia 10/15/2018   Diabetic retinopathy (HCC) 09/29/2018   Family history of leukemia 09/16/2018   Family history of breast cancer    Malignant neoplasm of upper-outer quadrant of left breast in female, 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/11/2018   Family history of breast cancer in sister 08/28/2018   Diverticular disease 10/17/2017   Primary osteoarthritis of right knee 03/07/2017   HSV-1 (herpes simplex virus 1) infection, 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   Vitamin D  deficiency 10/11/2016   Family history of thyroid  disorder 04/04/2015   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   Counseling regarding end of life decision making 10/04/2014   OA (osteoarthritis) of neck 08/02/2014   History of duodenal ulcer 08/02/2014   Seasonal allergies 08/02/2014   Hypertension associated with diabetes (HCC) 08/02/2014   Hyperlipidemia associated with type 2  diabetes mellitus (HCC) 08/02/2014   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   History of joint replacement 03/01/2014   Diabetes mellitus type 2 with retinopathy (HCC) 11/07/2009    PCP: Greig Ring, MD  REFERRING PROVIDER: Kip Lynwood Double, PA-C  REFERRING DIAG: R49.587 L breast cancer/ cording  THERAPY DIAG:  Stiffness of left shoulder, not elsewhere classified  Disorder of the skin and subcutaneous tissue related to radiation, unspecified  Cramp and spasm  Malignant neoplasm of upper-outer quadrant of left breast in female, estrogen receptor positive (HCC)  ONSET DATE: 03/08/2019  Rationale for Evaluation and Treatment: Rehabilitation  SUBJECTIVE:  SUBJECTIVE STATEMENT: My shoulder is feeling better. It feels looser.   PERTINENT HISTORY: In 2020 she underwent primary chemotherapy for a triple positive left breast cancer. This was followed by bilateral mastectomies and a sentinel lymph node biopsy  March 08 2019. 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 The right mastectomy was negative. The pathologist lost the nodes so there is no pathology on the nodes. This was followed by adjuvant radiation therapy and she is now on anastrozole  doing well. Due to take that for 7 years. SABRA DJD (degenerative joint disease)  DM II (diabetes mellitus, type II), controlled. R partial knee arthroplasty 12/31/2022, L total knee 2019, 04/19/2013 R hip replacement, has stent for cerebral aneurysm   PAIN:  Are you having pain? No, not currently   PRECAUTIONS: Other: L UE lymphedema risk, L total knee 2019, R partial knee 2024, R THA 2014, has stent for cerebral aneurysm  RED FLAGS: None   WEIGHT BEARING RESTRICTIONS: No  FALLS:  Has patient fallen in last 6  months? No  LIVING ENVIRONMENT: Lives with: lives with their spouse Lives in: House/apartment Stairs: Yes; Internal: 12 steps; can reach both and External: 5 steps; can reach both Has following equipment at home: Grab bars  OCCUPATION: retried  LEISURE: belongs to [employer] 2-4 days/week, does [employer], treadmill, recumbent bike, weight machines  HAND DOMINANCE: right   PRIOR LEVEL OF FUNCTION: Independent  PATIENT GOALS: to loosen the muscle to improve mobility, loosen scar tissue   OBJECTIVE: Note: Objective measures were completed at Evaluation unless otherwise noted.  COGNITION: Overall cognitive status: Within functional limits for tasks assessed   PALPATION: Tightness palpable especially at lateral edge of scar, cording at L axilla  OBSERVATIONS / OTHER ASSESSMENTS: telangiectasias visible at L chest  POSTURE: rounded shoulders, forward head  UPPER EXTREMITY AROM/PROM:  A/PROM RIGHT   eval   Shoulder extension 71  Shoulder flexion 162  Shoulder abduction 150  Shoulder internal rotation 67  Shoulder external rotation 76    (Blank rows = not tested)  A/PROM LEFT   eval  Shoulder extension 50  Shoulder flexion 147 with p!  Shoulder abduction 135  Shoulder internal rotation 55  Shoulder external rotation 79    (Blank rows = not tested)    UPPER EXTREMITY STRENGTH: R 5/5, L 4/5  LYMPHEDEMA ASSESSMENTS:   SURGERY TYPE/DATE: bilateral mastectomy and SLNB 03/08/19  NUMBER OF LYMPH NODES REMOVED: 1 - was lost so no pathology  CHEMOTHERAPY: completed  RADIATION:completed  HORMONE TREATMENT: on anastrozole   INFECTIONS: none   LYMPHEDEMA ASSESSMENTS:   LANDMARK RIGHT  eval  At axilla  34.5  15 cm proximal to olecranon process 31.4  10 cm proximal to olecranon process 28  Olecranon process 24.2  15 cm proximal to ulnar styloid process 23  10 cm proximal to ulnar styloid process 20.9  Just proximal to ulnar styloid process 17.5  Across  hand at thumb web space 18.5  At base of 2nd digit 6.6  (Blank rows = not tested)  LANDMARK LEFT  eval  At axilla  34.5  15 cm proximal to olecranon process 33  10 cm proximal to olecranon process 30.2  Olecranon process 26  15 cm proximal to ulnar styloid process 23.6  10 cm proximal to ulnar styloid process 21  Just proximal to ulnar styloid process 17  Across hand at thumb web space 18.3  At base of 2nd digit 6.5  (  Blank rows = not tested)   QUICK DASH:                                                                                                                                TREATMENT DATE:  03/30/24: Therapeutic Exercise Pulleys into flex and abd x 2 mins each returning therapist demo with VC's to stretch to end range Roll yellow ball up wall into flex and then Lt UE abd x 10 each returning therapist demo Therapeutic Activities Supine over 1/2 foam roll x 10 reps each: alternating flexion, Bil UE scaption into a V, and then snow  angels with improved ROM Instructed pt in supine scapular series with red theraband returning therapist demo for each of following: Bil horz abd, bil ER, narrow and wide grip flexion and then Rt and Lt D2 x 10 each Manual Therapy STM in supine using cocoa butter and WAVE tool to L pec to help decrease tightness, gentle brief suction cupping moving cup in all directions to help loosen fascia  03/25/24: Therapeutic Exercise Pulleys into flex and abd x 2 mins each returning therapist demo with VC's throughout to decrease Lt scap compensation, some improvement noted with this today Roll yellow ball up wall into flex and then Lt UE abd x 10 each returning therapist demo Decided not to do foam roll today as pt is just getting over flare up of diverticulitis and did not want to overstretch core.  Manual Therapy P/ROM of Lt shoulder into flex, abd, and D2 with scapular depression throughout by therapist MFR along tight Lt pect tendon STM in Rt S/L  to Lt medial scap border for trigger point release and UT with cocoa butter Scap Mobs into protraction and retraction when in S/L  03/22/24: Therapeutic Exercise Pulleys into flex and abd x 2 mins each returning therapist demo with VC's throughout to decrease Lt scap compensation, some improvement noted with this today Roll yellow ball up wall into flex and then Lt UE abd x 10 each returning therapist demo Therapeutic Activities Modified downward dog on wall x 5 reps, 5 sec holds returning therapist demo Supine over 1/2 foam roll x 10 reps each: Bil UE horizontal abduction, alternating flexion, Bil UE scaption into a V, and then snow  angels with improved ROM Instructed pt in supine scapular series with yellow theraband returning therapist demo for each of following: Bil horz abd, bil er, narrow and wide grip flexion and then Rt and Lt D2 x 5 each, handout issued to add to HEP Manual Therapy P/ROM of Lt shoulder into flex, abd, and D2 with scapular depression throughout by therapist MFR along tight Lt pect tendon STM in Rt S/L to Lt medial scap border for trigger point release and UT Scap Mobs into protraction and retraction when in S/L  03/18/24: Therapeutic Exercise Roll yellow ball up wall into flex and then Lt UE abd x 10 each returning therapist demo Pulleys  into flex and abd x 2 mins each returning therapist demo Doorway Pectoralis stretch x 3 reps, 20 sec holds Therapeutic Activities Modified downward dog on wall x 5 reps, 5 sec holds  Supine over 1/2 foam roll with pt returning therapist demo x 10 reps each: alternating flexion, alternating scaption, horizontal abduction, snow  angels with improved technique today Manual Therapy P/ROM of Lt shoulder into flex, abd, and D2 with scapular depression throughout MFR along tight Lt pect tendon and then cross hands technique horizontally across chest and vertically on Lt chest wall STM to Lt lateral trunk at area of palpable  tightness  03/16/24: Manual Therapy P/ROM of Lt shoulder into flex, abd, and D2 with scapular depression throughout MFR along tight Lt pect tendon and then cross hands technique horizontally across chest and vertically on Lt chest wall STM to Lt lateral trunk at area of palpable tightness  Therapeutic Exercise Pulleys into flex and abd x 2 mins each returning therapist demo Doorway pectoralis stretch x 4 reps, 10 sec holds encouraging pt to hold 20-30 at home and to incorporate this into her day after each bathroom visit Therapeutic Activities Supine over 1/2 foam roll with pt returning therapist demo for 10 reps each: alternating flexion, alternating scaption, horizontal abduction, snow  angels with elbow straight (v/c for all to avoid compensation) Modified downward dog on wall x 5 reps, 5 sec holds returning therapist demo     PATIENT EDUCATION:  Education details: Supine Scap Series with yellow theraband Person educated: Patient Education method: Explanation, Demonstration, VC's and handout issued Education comprehension: verbalized understanding, returned demonstration, tactile and VC's and pt will benefit from further review  HOME EXERCISE PROGRAM: Supine dowel flexion and abduction x 10 reps each with 5-15 sec holds 03/16/24 - Doorway pectoralis stretch 03/22/24 - supine scap series with yellow theraband  ASSESSMENT:  CLINICAL IMPRESSION: Pt is doing better from her diverticulitis flare and was able to return to foam roll exercises today. Increased to red band for resistance and issued red band for pt to begin to use at home. Pt reports the discomfort at medial scapula was doing better so focused on L pec tightness today during manual therapy. Began gentle suction cupping today to decrease tightness.    OBJECTIVE IMPAIRMENTS: decreased knowledge of condition, decreased ROM, decreased strength, increased muscle spasms, impaired UE functional use, postural dysfunction, and pain.    ACTIVITY LIMITATIONS: carrying, lifting, and reach over head  PARTICIPATION LIMITATIONS: cleaning, laundry, and community activity  PERSONAL FACTORS: Past/current experiences and Time since onset of injury/illness/exacerbation are also affecting patient's functional outcome.   REHAB POTENTIAL: Good  CLINICAL DECISION MAKING: Stable/uncomplicated  EVALUATION COMPLEXITY: Low  GOALS: Goals reviewed with patient? Yes  SHORT TERM GOALS: Target date: 03/23/24  Pt will be able to verbalize lymphedema risk reduction practices.  Baseline: Goal status: INITIAL  2.  Pt will be independent in initial home exercise program for stretching and strengthening.  Baseline:  Goal status: INITIAL   LONG TERM GOALS: Target date: 04/06/24  Pt will demonstrate 160 degrees of L shoulder flexion to allow her to reach overhead.  Baseline:  Goal status: INITIAL  2.  Pt will demonstrate 160 degrees of L shoulder abduction to allow her to reach out to the side. Baseline:  Goal status: INITIAL  3.  Pt will report a 75% improvement in feelings of tightness with UE overhead motion to allow improved comfort. Baseline:  Goal status: INITIAL  4.  Pt will be able  to raise L arm without increased pain to improve comfort. Baseline:  Goal status: INITIAL  5.  Pt will be independent in a home exercise program for continued stretching and strengthening.  Baseline:  Goal status: INITIAL   PLAN:  PT FREQUENCY: 2x/week  PT DURATION: 4 weeks  PLANNED INTERVENTIONS: 97164- PT Re-evaluation, 97110-Therapeutic exercises, 97530- Therapeutic activity, 97535- Self Care, [id number]- Manual therapy, [id number]- Orthotic Initial, [id number]- Orthotic/Prosthetic subsequent, and Manual lymph drainage  PLAN FOR NEXT SESSION: how was suction cup? Consider doing again and using small cup, Cont pulleys here to assess technique prn, ball, foam roll for pec stretch, MFR cording, AA/A/P/ROM  Florina Lanis Carbon, PT 03/30/2024, 10:03  AM   CHEST: Doorway, Bilateral - Standing    Stand with one foot in front of the other. Standing in doorway, place hands on wall with elbows bent at shoulder height. Lean forward. Hold _20-30__ seconds. _2-3__ reps per set, _4__ sets per day  Cancer Rehab [phone]

## 2024-04-01 ENCOUNTER — Ambulatory Visit

## 2024-04-01 DIAGNOSIS — Z170 Estrogen receptor positive status [ER+]: Secondary | ICD-10-CM

## 2024-04-01 DIAGNOSIS — C50412 Malignant neoplasm of upper-outer quadrant of left female breast: Secondary | ICD-10-CM | POA: Diagnosis not present

## 2024-04-01 DIAGNOSIS — L599 Disorder of the skin and subcutaneous tissue related to radiation, unspecified: Secondary | ICD-10-CM

## 2024-04-01 DIAGNOSIS — R252 Cramp and spasm: Secondary | ICD-10-CM

## 2024-04-01 DIAGNOSIS — M25612 Stiffness of left shoulder, not elsewhere classified: Secondary | ICD-10-CM | POA: Diagnosis not present

## 2024-04-01 NOTE — Therapy (Addendum)
 OUTPATIENT PHYSICAL THERAPY  UPPER EXTREMITY ONCOLOGY TREATMENT  Patient Name: Katie [name] MRN: [id number] DOB:[birth year], 78 y.o., female Today's Date: 04/01/2024  END OF SESSION:  PT End of Session - 04/01/24 0952     Visit Number 8    Number of Visits 9    Date for PT Re-Evaluation 04/06/24    Authorization Type [employer] approved 1 ero and 8 visits 03/09/24-04/06/24 auth # [id number]; new ins auth request sent 04/01/24    Authorization Time Period auth 20 visits  4/16-6/20  Authorization #[id number]    Authorization - Visit Number 8    Authorization - Number of Visits 8    PT Start Time 0958    PT Stop Time 1101    PT Time Calculation (min) 63 min    Activity Tolerance Patient tolerated treatment well    Behavior During Therapy [facility] for tasks assessed/performed          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, small, not treating-watching , it is now stented   Blood transfusion without reported diagnosis    Cancer (HCC)    breast 08/2018   Coronary artery disease    Diverticulitis    DJD (degenerative joint disease)    DM type 2 (diabetes mellitus, type 2) (HCC)    Family history of breast cancer    Fibrocystic breast disease    GERD (gastroesophageal reflux disease)    History of chicken pox    Hypercholesteremia    Hypertension    Lichen planopilaris    Neuromuscular disorder (HCC)    Sleep apnea    cpap   Urinary incontinence    Vertigo 12/2019   1 episode   Past Surgical History:  Procedure Laterality Date   BREAST IMPLANT REMOVAL Bilateral 03/08/2019   Procedure: Removal of Bilateral Breast Implants;  Surgeon: Ebbie Cough, MD;  Location: [facility] OR;  Service: General;  Laterality: Bilateral;   BREAST SURGERY  02/1975   Left Breast Biopsy-Fibrocystic Disease   BREAST SURGERY  10/1981   Bialteral Capsulectomy Breast Surgery   BREAST SURGERY  07/1998   Replace Bilateral Silicone Implants   BREAST SURGERY   01/1976   Bilater breast surgery to remove fibrocystic tissue and replace with silicone implants   CATARACT EXTRACTION W/PHACO Right 02/08/2020   Procedure: CATARACT EXTRACTION PHACO AND INTRAOCULAR LENS PLACEMENT (IOC) RIGHT DIABETIC;  Surgeon: Jaye Fallow, MD;  Location: [facility];  Service: Ophthalmology;  Laterality: Right;  3.55 0:33.7   CATARACT EXTRACTION W/PHACO Left 02/29/2020   Procedure: CATARACT EXTRACTION PHACO AND INTRAOCULAR LENS PLACEMENT (IOC) LEFT DIABETIC 2.93  00:27.1;  Surgeon: Jaye Fallow, MD;  Location: [facility];  Service: Ophthalmology;  Laterality: Left;  Diabetic - oral meds   CHOLECYSTECTOMY N/A 07/08/2016   Procedure: LAPAROSCOPIC CHOLECYSTECTOMY;  Surgeon: Carlin Pastel, MD;  Location: [facility] ORS;  Service: General;  Laterality: N/A;   COLONOSCOPY WITH PROPOFOL  N/A 11/07/2017   Procedure: COLONOSCOPY WITH PROPOFOL ;  Surgeon: Therisa Bi, MD;  Location: [facility] ENDOSCOPY;  Service: Gastroenterology;  Laterality: N/A;   DILATION AND CURETTAGE OF UTERUS  01/1994   ESOPHAGOGASTRODUODENOSCOPY (EGD) WITH PROPOFOL  N/A 12/05/2016   Procedure: ESOPHAGOGASTRODUODENOSCOPY (EGD) WITH PROPOFOL ;  Surgeon: Bi Therisa, MD;  Location: [facility] ENDOSCOPY;  Service: Endoscopy;  Laterality: N/A;   FINGER SURGERY  2001 & 2003   Trigger Finger   FOOT SURGERY  1980   To Relieve Pinched Nerve  FOOT SURGERY  07/2008   Left Foot-Plantar Fasciitis   JOINT REPLACEMENT  04/19/2013   Hip Replacement-Right   MASTECTOMY     double   MASTECTOMY W/ SENTINEL NODE BIOPSY Bilateral 03/08/2019   Procedure: BILATERAL MASTECTOMIES WITH LEFT AXILLARY SENTINEL LYMPH NODE BIOPSY AND BLUE DYE INJECTION;  Surgeon: Ebbie Cough, MD;  Location: [facility] OR;  Service: General;  Laterality: Bilateral;   PARTIAL KNEE ARTHROPLASTY Right 12/31/2022   Procedure: RIGHT PARTIAL KNEE ARTHROPLASTY;  Surgeon: Edie Norleen PARAS, MD;  Location: [facility] ORS;  Service: Orthopedics;  Laterality: Right;    PLACEMENT OF BREAST IMPLANTS  12/26/2004   Replace Failed Implants   REPLACEMENT TOTAL KNEE Left    2019-   RHINOPLASTY  03/1964   To Correct Deviated Septum   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/2023   Acute diverticulitis 03/21/2023   Abdominal pain, left lower quadrant 03/16/2023   Burning with urination 03/07/2023   Overweight (BMI 25.0-29.9) 03/05/2022   Elevated ferritin 03/23/2021   Decreased GFR 02/23/2021   Vitamin B 12 deficiency 02/23/2021   Leukopenia 02/23/2021   Bronchiectasis without complication (HCC) 01/26/2020   Aortic atherosclerosis (HCC) 12/23/2019   Atherosclerosis of coronary artery of native heart without angina pectoris 12/23/2019   Peripheral edema 12/22/2019   Chemotherapy-induced peripheral neuropathy (HCC) 10/29/2019   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   Anemia 10/15/2018   Diabetic retinopathy (HCC) 09/29/2018   Family history of leukemia 09/16/2018   Family history of breast cancer    Malignant neoplasm of upper-outer quadrant of left breast in female, 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/11/2018   Family history of breast cancer in sister 08/28/2018   Diverticular disease 10/17/2017   Primary osteoarthritis of right knee 03/07/2017   HSV-1 (herpes simplex virus 1) infection, 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   Vitamin D  deficiency 10/11/2016   Family history of thyroid  disorder 04/04/2015   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   Counseling regarding end of life decision making 10/04/2014   OA (osteoarthritis) of neck 08/02/2014   History of duodenal ulcer 08/02/2014   Seasonal allergies 08/02/2014   Hypertension associated with diabetes (HCC) 08/02/2014    Hyperlipidemia associated with type 2 diabetes mellitus (HCC) 08/02/2014   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   History of joint replacement 03/01/2014   Diabetes mellitus type 2 with retinopathy (HCC) 11/07/2009    PCP: Greig Ring, MD  REFERRING PROVIDER: Kip Lynwood Double, PA-C  REFERRING DIAG: R49.587 L breast cancer/ cording  THERAPY DIAG:  Stiffness of left shoulder, not elsewhere classified  Disorder of the skin and subcutaneous tissue related to radiation, unspecified  Cramp and spasm  Malignant neoplasm of upper-outer quadrant of left breast in female, estrogen receptor positive (HCC)  ONSET DATE: 03/08/2019  Rationale for Evaluation and Treatment: Rehabilitation  SUBJECTIVE:  SUBJECTIVE STATEMENT: I think the cupping helped loosen my chest a bit more. I got my pulley height figured out at home so that's going good now. Overall I know I'm getting better but I have still have so much tightness at my end reach. I'd like to keep coming but maybe only 1x/wk with what I'm currently also doing at home?  PERTINENT HISTORY: In 2020 she underwent primary chemotherapy for a triple positive left breast cancer. This was followed by bilateral mastectomies and a sentinel lymph node biopsy  March 08 2019. 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 The right mastectomy was negative. The pathologist lost the nodes so there is no pathology on the nodes. This was followed by adjuvant radiation therapy and she is now on anastrozole  doing well. Due to take that for 7 years. Katie [name] DJD (degenerative joint disease)  DM II (diabetes mellitus, type II), controlled. R partial knee arthroplasty 12/31/2022, L total knee 2019, 04/19/2013 R hip replacement, has stent for  cerebral aneurysm   PAIN:  Are you having pain? No, not currently.   PRECAUTIONS: Other: L UE lymphedema risk, L total knee 2019, R partial knee 2024, R THA 2014, has stent for cerebral aneurysm  RED FLAGS: None   WEIGHT BEARING RESTRICTIONS: No  FALLS:  Has patient fallen in last 6 months? No  LIVING ENVIRONMENT: Lives with: lives with their spouse Lives in: House/apartment Stairs: Yes; Internal: 12 steps; can reach both and External: 5 steps; can reach both Has following equipment at home: Grab bars  OCCUPATION: retried  LEISURE: belongs to [employer] 2-4 days/week, does [employer], treadmill, recumbent bike, weight machines  HAND DOMINANCE: right   PRIOR LEVEL OF FUNCTION: Independent  PATIENT GOALS: to loosen the muscle to improve mobility, loosen scar tissue   OBJECTIVE: Note: Objective measures were completed at Evaluation unless otherwise noted.  COGNITION: Overall cognitive status: Within functional limits for tasks assessed   PALPATION: Tightness palpable especially at lateral edge of scar, cording at L axilla  OBSERVATIONS / OTHER ASSESSMENTS: telangiectasias visible at L chest  POSTURE: rounded shoulders, forward head  UPPER EXTREMITY AROM/PROM:  A/PROM RIGHT   eval   Shoulder extension 71  Shoulder flexion 162  Shoulder abduction 150  Shoulder internal rotation 67  Shoulder external rotation 76    (Blank rows = not tested)  A/PROM LEFT   eval Left 04/01/24  Shoulder extension 50   Shoulder flexion 147 with p! 152  Shoulder abduction 135 146  Shoulder internal rotation 55   Shoulder external rotation 79     (Blank rows = not tested)    UPPER EXTREMITY STRENGTH: R 5/5, L 4/5  LYMPHEDEMA ASSESSMENTS:   SURGERY TYPE/DATE: bilateral mastectomy and SLNB 03/08/19  NUMBER OF LYMPH NODES REMOVED: 1 - was lost so no pathology  CHEMOTHERAPY: completed  RADIATION:completed  HORMONE TREATMENT: on anastrozole   INFECTIONS:  none   LYMPHEDEMA ASSESSMENTS:   LANDMARK RIGHT  eval  At axilla  34.5  15 cm proximal to olecranon process 31.4  10 cm proximal to olecranon process 28  Olecranon process 24.2  15 cm proximal to ulnar styloid process 23  10 cm proximal to ulnar styloid process 20.9  Just proximal to ulnar styloid process 17.5  Across hand at thumb web space 18.5  At base of 2nd digit 6.6  (Blank rows = not tested)  LANDMARK LEFT  eval  At axilla  34.5  15 cm  proximal to olecranon process 33  10 cm proximal to olecranon process 30.2  Olecranon process 26  15 cm proximal to ulnar styloid process 23.6  10 cm proximal to ulnar styloid process 21  Just proximal to ulnar styloid process 17  Across hand at thumb web space 18.3  At base of 2nd digit 6.5  (Blank rows = not tested)   QUICK DASH:  03/09/24: 20.45% 04/01/24:   Quick Dash - 04/01/24 0001     Open a tight or new jar Mild difficulty    Do heavy household chores (wash walls, wash floors) Mild difficulty    Carry a shopping bag or briefcase No difficulty    Wash your back Mild difficulty    Use a knife to cut food No difficulty    Recreational activities in which you take some force or impact through your arm, shoulder, or hand (golf, hammering, tennis) Mild difficulty    During the past week, to what extent has your arm, shoulder or hand problem interfered with your normal social activities with family, friends, neighbors, or groups? Not at all    During the past week, to what extent has your arm, shoulder or hand problem limited your work or other regular daily activities Slightly    Arm, shoulder, or hand pain. Mild    Tingling (pins and needles) in your arm, shoulder, or hand None    Difficulty Sleeping No difficulty    DASH Score 13.64 %                                                                                                                                      TREATMENT DATE:  04/01/24: Therapeutic Exercise Pulleys  into flex and abd x 2 mins each returning therapist demo with VC's to stretch to end range Roll yellow ball up wall into flex and then Lt UE abd x 10 each returning therapist demo Modified downward dog on wall x 5 reps, 5 sec holds returning therapist demo Therapeutic Activities Free Motion Machine: Bil scap retract 7# x 10 reps, then bil UE ext 3# (trouble with technique with 7#) x 10 reps with core engaged Self Care Assessed current goals and current functional status, and reassessing A/ROM.  Manual Therapy P/ROM of Lt shoulder into flex, abd, and D2 with scapular depression throughout by therapist MFR along tight Lt pect tendon STM in supine using cocoa butter to Lt pec to help decrease tightness and gentle suction cupping moving cup in all directions to help loosen fascia x 5 mins over pec and lateral trunk  03/30/24: Therapeutic Exercise Pulleys into flex and abd x 2 mins each returning therapist demo with VC's to stretch to end range Roll yellow ball up wall into flex and then Lt UE abd x 10 each returning therapist demo Therapeutic Activities Supine over 1/2 foam roll x 10 reps each: alternating flexion, Bil UE scaption into  a V, and then snow  angels with improved ROM Instructed pt in supine scapular series with red theraband returning therapist demo for each of following: Bil horz abd, bil ER, narrow and wide grip flexion and then Rt and Lt D2 x 10 each Manual Therapy STM in supine using cocoa butter and WAVE tool to L pec to help decrease tightness, gentle brief suction cupping moving cup in all directions to help loosen fascia  03/25/24: Therapeutic Exercise Pulleys into flex and abd x 2 mins each returning therapist demo with VC's throughout to decrease Lt scap compensation, some improvement noted with this today Roll yellow ball up wall into flex and then Lt UE abd x 10 each returning therapist demo Decided not to do foam roll today as pt is just getting over flare up of  diverticulitis and did not want to overstretch core.  Manual Therapy P/ROM of Lt shoulder into flex, abd, and D2 with scapular depression throughout by therapist MFR along tight Lt pect tendon STM in Rt S/L to Lt medial scap border for trigger point release and UT with cocoa butter Scap Mobs into protraction and retraction when in S/L     PATIENT EDUCATION:  Education details: Supine Scap Series with yellow theraband Person educated: Patient Education method: Explanation, Demonstration, VC's and handout issued Education comprehension: verbalized understanding, returned demonstration, tactile and VC's and pt will benefit from further review  HOME EXERCISE PROGRAM: Supine dowel flexion and abduction x 10 reps each with 5-15 sec holds 03/16/24 - Doorway pectoralis stretch 03/22/24 - supine scap series with yellow theraband  ASSESSMENT:  CLINICAL IMPRESSION: Reviewed goals and current functional status for insurance re auth request. Pt is progressing well overall towards goal of improved Lt shoulder A/ROM.She has met her goal of no pain with use of arm with ADLs. Quick DASH has improved from 20% to 13% limited. She has also made good progress towards HEP goal as she is independent with current HEP, however will benefit from progression of HEP for strength and instruction of gym exercises (began this today) as she would like to return to gym for strength. Pt is, though, beginning to feel confident towards what we currently have her working on at home so would like to reduce freq to 1x/wk x up to 4-6 more weeks.    OBJECTIVE IMPAIRMENTS: decreased knowledge of condition, decreased ROM, decreased strength, increased muscle spasms, impaired UE functional use, postural dysfunction, and pain.   ACTIVITY LIMITATIONS: carrying, lifting, and reach over head  PARTICIPATION LIMITATIONS: cleaning, laundry, and community activity  PERSONAL FACTORS: Past/current experiences and Time since onset of  injury/illness/exacerbation are also affecting patient's functional outcome.   REHAB POTENTIAL: Good  CLINICAL DECISION MAKING: Stable/uncomplicated  EVALUATION COMPLEXITY: Low  GOALS: Goals reviewed with patient? Yes  SHORT TERM GOALS: Target date: 03/23/24  Pt will be able to verbalize lymphedema risk reduction practices.  Baseline: Goal status: MET  2.  Pt will be independent in initial home exercise program for stretching and strengthening.  Baseline:  Goal status: MET   LONG TERM GOALS: Target date: 04/06/24  Pt will demonstrate 160 degrees of L shoulder flexion to allow her to reach overhead.  Baseline: 147 degrees with p!; 04/01/24 - 152 degrees Goal status: ONGOING  2.  Pt will demonstrate 160 degrees of L shoulder abduction to allow her to reach out to the side. Baseline: 135 degrees; 04/01/24 - 146 degrees  Goal status: ONGOING  3.  Pt will report a 75% improvement  in feelings of tightness with UE overhead motion to allow improved comfort. Baseline: 04/01/24 - 50-60% Goal status: ONGOING  4.  Pt will be able to raise L arm without increased pain to improve comfort. Baseline:  Goal status: MET  5.  Pt will be independent in a home exercise program for continued stretching and strengthening.  Baseline: 04/01/24 - Pt is independent with initial HEP and will benefit from progression of this to include gym exs education Goal status: ONGOING   PLAN:  PT FREQUENCY: 1x/wk  PT DURATION: 4 weeks  PLANNED INTERVENTIONS: 97164- PT Re-evaluation, 97110-Therapeutic exercises, 97530- Therapeutic activity, 97535- Self Care, 02859- Manual therapy, 97760- Orthotic Initial, [id number]- Orthotic/Prosthetic subsequent, and Manual lymph drainage  PLAN FOR NEXT SESSION: Update ins auth in note if approved; MD renewal next session. Cont pulleys here to assess technique prn, ball, foam roll for pec stretch, Cont cupping for fascial restrictions and adhesions; MFR cording,  AA/A/P/ROM  Aden Berwyn Caldron, PTA 04/01/2024, 12:49 PM   CHEST: Doorway, Bilateral - Standing    Stand with one foot in front of the other. Standing in doorway, place hands on wall with elbows bent at shoulder height. Lean forward. Hold _20-30__ seconds. _2-3__ reps per set, _4__ sets per day  Cancer Rehab [phone]

## 2024-04-04 DIAGNOSIS — G4733 Obstructive sleep apnea (adult) (pediatric): Secondary | ICD-10-CM | POA: Diagnosis not present

## 2024-04-06 ENCOUNTER — Ambulatory Visit: Admitting: Physical Therapy

## 2024-04-06 DIAGNOSIS — R252 Cramp and spasm: Secondary | ICD-10-CM | POA: Diagnosis not present

## 2024-04-06 DIAGNOSIS — Z170 Estrogen receptor positive status [ER+]: Secondary | ICD-10-CM

## 2024-04-06 DIAGNOSIS — L599 Disorder of the skin and subcutaneous tissue related to radiation, unspecified: Secondary | ICD-10-CM

## 2024-04-06 DIAGNOSIS — M25612 Stiffness of left shoulder, not elsewhere classified: Secondary | ICD-10-CM | POA: Diagnosis not present

## 2024-04-06 DIAGNOSIS — C50412 Malignant neoplasm of upper-outer quadrant of left female breast: Secondary | ICD-10-CM | POA: Diagnosis not present

## 2024-04-06 NOTE — Therapy (Signed)
 OUTPATIENT PHYSICAL THERAPY  UPPER EXTREMITY ONCOLOGY TREATMENT  Patient Name: Katie [name] MRN: [id number] DOB:09-15-1945, 78 y.o., female Today's Date: 04/06/2024  END OF SESSION:  PT End of Session - 04/06/24 1254     Visit Number 9    Number of Visits 15    Date for PT Re-Evaluation 05/18/24    Authorization Type [employer] approved 6 visits 04/06/24- 05/21/24    Authorization - Visit Number 1    Authorization - Number of Visits 6    PT Start Time 1200    PT Stop Time 1256    PT Time Calculation (min) 56 min    Activity Tolerance Patient tolerated treatment well    Behavior During Therapy WFL for tasks assessed/performed           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, small, not treating-watching , it is now stented   Blood transfusion without reported diagnosis    Cancer (HCC)    breast 08/2018   Coronary artery disease    Diverticulitis    DJD (degenerative joint disease)    DM type 2 (diabetes mellitus, type 2) (HCC)    Family history of breast cancer    Fibrocystic breast disease    GERD (gastroesophageal reflux disease)    History of chicken pox    Hypercholesteremia    Hypertension    Lichen planopilaris    Neuromuscular disorder (HCC)    Sleep apnea    cpap   Urinary incontinence    Vertigo 12/2019   1 episode   Past Surgical History:  Procedure Laterality Date   BREAST IMPLANT REMOVAL Bilateral 03/08/2019   Procedure: Removal of Bilateral Breast Implants;  Surgeon: Ebbie Cough, MD;  Location: [facility] OR;  Service: General;  Laterality: Bilateral;   BREAST SURGERY  02/1975   Left Breast Biopsy-Fibrocystic Disease   BREAST SURGERY  10/1981   Bialteral Capsulectomy Breast Surgery   BREAST SURGERY  07/1998   Replace Bilateral Silicone Implants   BREAST SURGERY  01/1976   Bilater breast surgery to remove fibrocystic tissue and replace with silicone implants   CATARACT EXTRACTION W/PHACO Right  02/08/2020   Procedure: CATARACT EXTRACTION PHACO AND INTRAOCULAR LENS PLACEMENT (IOC) RIGHT DIABETIC;  Surgeon: Jaye Fallow, MD;  Location: [facility];  Service: Ophthalmology;  Laterality: Right;  3.55 0:33.7   CATARACT EXTRACTION W/PHACO Left 02/29/2020   Procedure: CATARACT EXTRACTION PHACO AND INTRAOCULAR LENS PLACEMENT (IOC) LEFT DIABETIC 2.93  00:27.1;  Surgeon: Jaye Fallow, MD;  Location: [facility];  Service: Ophthalmology;  Laterality: Left;  Diabetic - oral meds   CHOLECYSTECTOMY N/A 07/08/2016   Procedure: LAPAROSCOPIC CHOLECYSTECTOMY;  Surgeon: Carlin Pastel, MD;  Location: [facility] ORS;  Service: General;  Laterality: N/A;   COLONOSCOPY WITH PROPOFOL  N/A 11/07/2017   Procedure: COLONOSCOPY WITH PROPOFOL ;  Surgeon: Therisa Bi, MD;  Location: [facility] END[facility]COPY;  Service: Gastroenterology;  Laterality: N/A;   DILATION AND CURETTAGE OF UTERUS  01/1994   ESOPHAGOGASTRODUODENOSCOPY (EGD) WITH PROPOFOL  N/A 12/05/2016   Procedure: ESOPHAGOGASTRODUODENOSCOPY (EGD) WITH PROPOFOL ;  Surgeon: Bi Therisa, MD;  Location: [facility] ENDOSCOPY;  Service: Endoscopy;  Laterality: N/A;   FINGER SURGERY  2001 & 2003   Trigger Finger   FOOT SURGERY  1980   To Relieve Pinched Nerve   FOOT SURGERY  07/2008   Left Foot-Plantar Fasciitis   JOINT REPLACEMENT  04/19/2013   Hip Replacement-Right   MASTECTOMY  double   MASTECTOMY W/ SENTINEL NODE BIOPSY Bilateral 03/08/2019   Procedure: BILATERAL MASTECTOMIES WITH LEFT AXILLARY SENTINEL LYMPH NODE BIOPSY AND BLUE DYE INJECTION;  Surgeon: Ebbie Cough, MD;  Location: [facility] OR;  Service: General;  Laterality: Bilateral;   PARTIAL KNEE ARTHROPLASTY Right 12/31/2022   Procedure: RIGHT PARTIAL KNEE ARTHROPLASTY;  Surgeon: Edie Norleen PARAS, MD;  Location: [facility] ORS;  Service: Orthopedics;  Laterality: Right;   PLACEMENT OF BREAST IMPLANTS  12/26/2004   Replace Failed Implants   REPLACEMENT TOTAL KNEE Left    2019-   RHINOPLASTY  03/1964    To Correct Deviated Septum   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/2023   Acute diverticulitis 03/21/2023   Abdominal pain, left lower quadrant 03/16/2023   Burning with urination 03/07/2023   Overweight (BMI 25.0-29.9) 03/05/2022   Elevated ferritin 03/23/2021   Decreased GFR 02/23/2021   Vitamin B 12 deficiency 02/23/2021   Leukopenia 02/23/2021   Bronchiectasis without complication (HCC) 01/26/2020   Aortic atherosclerosis (HCC) 12/23/2019   Atherosclerosis of coronary artery of native heart without angina pectoris 12/23/2019   Peripheral edema 12/22/2019   Chemotherapy-induced peripheral neuropathy (HCC) 10/29/2019   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   Anemia 10/15/2018   Diabetic retinopathy (HCC) 09/29/2018   Family history of leukemia 09/16/2018   Family history of breast cancer    Malignant neoplasm of upper-outer quadrant of left breast in female, 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/11/2018   Family history of breast cancer in sister 08/28/2018   Diverticular disease 10/17/2017   Primary osteoarthritis of right knee 03/07/2017   HSV-1 (herpes simplex virus 1) infection, 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   Vitamin D  deficiency 10/11/2016   Family history of thyroid  disorder 04/04/2015   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   Counseling regarding end of life decision making 10/04/2014   OA (osteoarthritis) of neck 08/02/2014   History of duodenal ulcer 08/02/2014   Seasonal allergies 08/02/2014   Hypertension associated with diabetes (HCC) 08/02/2014   Hyperlipidemia associated with type 2 diabetes mellitus (HCC) 08/02/2014   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   History of joint replacement  03/01/2014   Diabetes mellitus type 2 with retinopathy (HCC) 11/07/2009    PCP: Greig Ring, MD  REFERRING PROVIDER: Kip Lynwood Double, PA-C  REFERRING DIAG: R49.587 L breast cancer/ cording  THERAPY DIAG:  Stiffness of left shoulder, not elsewhere classified  Disorder of the skin and subcutaneous tissue related to radiation, unspecified  Cramp and spasm  Malignant neoplasm of upper-outer quadrant of left breast in female, estrogen receptor positive (HCC)  ONSET DATE: 03/08/2019  Rationale for Evaluation and Treatment: Rehabilitation  SUBJECTIVE:  SUBJECTIVE STATEMENT: I would like to get a little more ROM and to decrease the cording some more.   PERTINENT HISTORY: In 2020 she underwent primary chemotherapy for a triple positive left breast cancer. This was followed by bilateral mastectomies and a sentinel lymph node biopsy  March 08 2019. 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 The right mastectomy was negative. The pathologist lost the nodes so there is no pathology on the nodes. This was followed by adjuvant radiation therapy and she is now on anastrozole  doing well. Due to take that for 7 years. SABRA DJD (degenerative joint disease)  DM II (diabetes mellitus, type II), controlled. R partial knee arthroplasty 12/31/2022, L total knee 2019, 04/19/2013 R hip replacement, has stent for cerebral aneurysm   PAIN:  Are you having pain? No, not currently.   PRECAUTIONS: Other: L UE lymphedema risk, L total knee 2019, R partial knee 2024, R THA 2014, has stent for cerebral aneurysm  RED FLAGS: None   WEIGHT BEARING RESTRICTIONS: No  FALLS:  Has patient fallen in last 6 months? No  LIVING ENVIRONMENT: Lives with: lives with their  spouse Lives in: House/apartment Stairs: Yes; Internal: 12 steps; can reach both and External: 5 steps; can reach both Has following equipment at home: Grab bars  OCCUPATION: retried  LEISURE: belongs to [employer] 2-4 days/week, does [employer], treadmill, recumbent bike, weight machines  HAND DOMINANCE: right   PRIOR LEVEL OF FUNCTION: Independent  PATIENT GOALS: to loosen the muscle to improve mobility, loosen scar tissue   OBJECTIVE: Note: Objective measures were completed at Evaluation unless otherwise noted.  COGNITION: Overall cognitive status: Within functional limits for tasks assessed   PALPATION: Tightness palpable especially at lateral edge of scar, cording at L axilla  OBSERVATIONS / OTHER ASSESSMENTS: telangiectasias visible at L chest  POSTURE: rounded shoulders, forward head  UPPER EXTREMITY AROM/PROM:  A/PROM RIGHT   eval   Shoulder extension 71  Shoulder flexion 162  Shoulder abduction 150  Shoulder internal rotation 67  Shoulder external rotation 76    (Blank rows = not tested)  A/PROM LEFT   eval Left 04/01/24  Shoulder extension 50   Shoulder flexion 147 with p! 152  Shoulder abduction 135 146  Shoulder internal rotation 55   Shoulder external rotation 79     (Blank rows = not tested)    UPPER EXTREMITY STRENGTH: R 5/5, L 4/5  LYMPHEDEMA ASSESSMENTS:   SURGERY TYPE/DATE: bilateral mastectomy and SLNB 03/08/19  NUMBER OF LYMPH NODES REMOVED: 1 - was lost so no pathology  CHEMOTHERAPY: completed  RADIATION:completed  HORMONE TREATMENT: on anastrozole   INFECTIONS: none   LYMPHEDEMA ASSESSMENTS:   LANDMARK RIGHT  eval  At axilla  34.5  15 cm proximal to olecranon process 31.4  10 cm proximal to olecranon process 28  Olecranon process 24.2  15 cm proximal to ulnar styloid process 23  10 cm proximal to ulnar styloid process 20.9  Just proximal to ulnar styloid process 17.5  Across hand at thumb web space 18.5  At base of  2nd digit 6.6  (Blank rows = not tested)  LANDMARK LEFT  eval  At axilla  34.5  15 cm proximal to olecranon process 33  10 cm proximal to olecranon process 30.2  Olecranon process 26  15 cm proximal to ulnar styloid process 23.6  10 cm proximal to ulnar styloid process 21  Just proximal to ulnar styloid process 17  Across hand at thumb web space 18.3  At base of 2nd digit 6.5  (Blank rows = not tested)   QUICK DASH:  03/09/24: 20.45% 04/01/24:                                                                                                                                 TREATMENT DATE:  04/06/24: Therapeutic Exercise Pulleys into flex and abd x 2 mins each returning therapist demo with VC's to stretch to end range Roll yellow ball up wall into flex and then Lt UE abd x 10 each returning therapist demo Therapeutic Activities Free Motion Machine: Bil scap retract 7# x 10 reps, then UE ext 7# on R then on Lx 10 reps with core engaged, flexion x 3# x 10, ER bilaterally x 3 lbs but pt had increased difficulty on R so had pt do R with no resistance, x 10 on L.  Manual Therapy P/ROM of Lt shoulder into flex, and abduction MFR along tight Lt pect tendon STM in supine using cocoa butter to Lt pec to help decrease tightness and gentle suction cupping moving cup in all directions to help loosen fascia x 8 mins over pec and lateral trunk  04/01/24: Therapeutic Exercise Pulleys into flex and abd x 2 mins each returning therapist demo with VC's to stretch to end range Roll yellow ball up wall into flex and then Lt UE abd x 10 each returning therapist demo Modified downward dog on wall x 5 reps, 5 sec holds returning therapist demo Therapeutic Activities Free Motion Machine: Bil scap retract 7# x 10 reps, then bil UE ext 3# (trouble with technique with 7#) x 10 reps with core engaged Self Care Assessed current goals and current functional status, and reassessing A/ROM.  Manual Therapy P/ROM  of Lt shoulder into flex, abd, and D2 with scapular depression throughout by therapist MFR along tight Lt pect tendon STM in supine using cocoa butter to Lt pec to help decrease tightness and gentle suction cupping moving cup in all directions to help loosen fascia x 5 mins over pec and lateral trunk  03/30/24: Therapeutic Exercise Pulleys into flex and abd x 2 mins each returning therapist demo with VC's to stretch to end range Roll yellow ball up wall into flex and then Lt UE abd x 10 each returning therapist demo Therapeutic Activities Supine over 1/2 foam roll x 10 reps each: alternating flexion, Bil UE scaption into a V, and then snow  angels with improved ROM Instructed pt in supine scapular series with red theraband returning therapist demo for each of following: Bil horz abd, bil ER, narrow and wide grip flexion and then Rt and Lt D2 x 10 each Manual Therapy STM in supine using cocoa butter and WAVE tool to L pec to help decrease tightness, gentle brief suction cupping moving cup in all directions to help loosen fascia  03/25/24: Therapeutic Exercise Pulleys into flex  and abd x 2 mins each returning therapist demo with VC's throughout to decrease Lt scap compensation, some improvement noted with this today Roll yellow ball up wall into flex and then Lt UE abd x 10 each returning therapist demo Decided not to do foam roll today as pt is just getting over flare up of diverticulitis and did not want to overstretch core.  Manual Therapy P/ROM of Lt shoulder into flex, abd, and D2 with scapular depression throughout by therapist MFR along tight Lt pect tendon STM in Rt S/L to Lt medial scap border for trigger point release and UT with cocoa butter Scap Mobs into protraction and retraction when in S/L     PATIENT EDUCATION:  Education details: Supine Scap Series with yellow theraband Person educated: Patient Education method: Explanation, Demonstration, VC's and handout  issued Education comprehension: verbalized understanding, returned demonstration, tactile and VC's and pt will benefit from further review  HOME EXERCISE PROGRAM: Supine dowel flexion and abduction x 10 reps each with 5-15 sec holds 03/16/24 - Doorway pectoralis stretch 03/22/24 - supine scap series with yellow theraband  ASSESSMENT:  CLINICAL IMPRESSION: Pt is progressing towards all goals in therapy. Continued to progress strengthening exercises. Looked at a picture of what equipment pt has in her gym. Educated pt how she can mimic some of the exercises using a theraband. Educated pt on proper form. Continued with suction cupping with increased skin mobility noted afterwards. She would benefit from additional skilled PT services to continue to improve L shoulder ROM, decrease tightness and improve strength.    OBJECTIVE IMPAIRMENTS: decreased knowledge of condition, decreased ROM, decreased strength, increased muscle spasms, impaired UE functional use, postural dysfunction, and pain.   ACTIVITY LIMITATIONS: carrying, lifting, and reach over head  PARTICIPATION LIMITATIONS: cleaning, laundry, and community activity  PERSONAL FACTORS: Past/current experiences and Time since onset of injury/illness/exacerbation are also affecting patient's functional outcome.   REHAB POTENTIAL: Good  CLINICAL DECISION MAKING: Stable/uncomplicated  EVALUATION COMPLEXITY: Low  GOALS: Goals reviewed with patient? Yes  SHORT TERM GOALS: Target date: 03/23/24  Pt will be able to verbalize lymphedema risk reduction practices.  Baseline: Goal status: MET  2.  Pt will be independent in initial home exercise program for stretching and strengthening.  Baseline:  Goal status: MET   LONG TERM GOALS: Target date: 04/06/24  Pt will demonstrate 160 degrees of L shoulder flexion to allow her to reach overhead.  Baseline: 147 degrees with p!; 04/01/24 - 152 degrees Goal status: ONGOING  2.  Pt will  demonstrate 160 degrees of L shoulder abduction to allow her to reach out to the side. Baseline: 135 degrees; 04/01/24 - 146 degrees  Goal status: ONGOING  3.  Pt will report a 75% improvement in feelings of tightness with UE overhead motion to allow improved comfort. Baseline: 04/01/24 - 50-60% Goal status: ONGOING  4.  Pt will be able to raise L arm without increased pain to improve comfort. Baseline:  Goal status: MET  5.  Pt will be independent in a home exercise program for continued stretching and strengthening.  Baseline: 04/01/24 - Pt is independent with initial HEP and will benefit from progression of this to include gym exs education Goal status: ONGOING   PLAN:  PT FREQUENCY: 1x/wk  PT DURATION: 6 weeks  PLANNED INTERVENTIONS: 97164- PT Re-evaluation, 97110-Therapeutic exercises, 97530- Therapeutic activity, 97535- Self Care, [id number]- Manual therapy, 97760- Orthotic Initial, [id number]- Orthotic/Prosthetic subsequent, and Manual lymph drainage  PLAN FOR NEXT SESSION: Cont pulleys  here to assess technique prn, ball, foam roll for pec stretch, Cont cupping for fascial restrictions and adhesions; MFR cording, AA/A/P/ROM  Florina Lanis Carbon, PT 04/06/2024, 12:58 PM   CHEST: Doorway, Bilateral - Standing    Stand with one foot in front of the other. Standing in doorway, place hands on wall with elbows bent at shoulder height. Lean forward. Hold _20-30__ seconds. _2-3__ reps per set, _4__ sets per day  Cancer Rehab [phone]

## 2024-04-09 ENCOUNTER — Encounter: Payer: Self-pay | Admitting: Pulmonary Disease

## 2024-04-09 ENCOUNTER — Ambulatory Visit: Admitting: Pulmonary Disease

## 2024-04-09 VITALS — BP 128/4 | HR 84 | Temp 97.9°F | Ht 62.5 in | Wt 156.0 lb

## 2024-04-09 DIAGNOSIS — G4733 Obstructive sleep apnea (adult) (pediatric): Secondary | ICD-10-CM

## 2024-04-09 DIAGNOSIS — J45991 Cough variant asthma: Secondary | ICD-10-CM

## 2024-04-09 DIAGNOSIS — J479 Bronchiectasis, uncomplicated: Secondary | ICD-10-CM | POA: Diagnosis not present

## 2024-04-09 MED ORDER — ALBUTEROL SULFATE HFA 108 (90 BASE) MCG/ACT IN AERS: 2.0000 | INHALATION_SPRAY | Freq: Four times a day (QID) | RESPIRATORY_TRACT | 2 refills | Status: AC | PRN

## 2024-04-09 NOTE — Progress Notes (Signed)
 Subjective:    Patient ID: Katie [name], female    DOB: [birth year], 78 y.o.   MRN: [id number]  Patient Care Team: Avelina Greig BRAVO, MD as PCP - General (Family Medicine) Darliss Rogue, MD as PCP - Cardiology (Cardiology) Jaye Fallow, MD as Referring Physician (Ophthalmology) Chrystie Edmund CROME, MD as Consulting Physician (Dermatology) Shelva Dunnings, MD as Consulting Physician (General Surgery) Danley Shuck, MD as Consulting Physician (Dentistry) Odean Potts, MD as Consulting Physician (Hematology and Oncology) Izell Domino, MD as Attending Physician (Radiation Oncology) Ebbie Cough, MD as Consulting Physician (General Surgery)  Chief Complaint  Patient presents with   Follow-up    No breathing problems. Using CPAP every night. No problems with mask or pressure.     BACKGROUND/INTERVAL:Katie [name] is a 78 year old lifelong never smoker, with a history of HER-2 positive breast cancer status post bilateral mastectomies, radiation and neoadjuvant chemotherapy.  She was initially seen here on 26 January 2020 for abnormal chest x-ray and for issues with cough that started after initiation of anastrozole .  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  At that time inhaled corticosteroids were started with relief of her cough.  She was last seen here on 01 October 2023 She has cough variant asthma.  She is on CPAP for obstructive sleep apnea.  She is very compliant with asthma and OSA therapy.   HPI Discussed the use of AI scribe software for clinical note transcription with the patient, who gave verbal consent to proceed.  History of Present Illness   Katie [name] is a 78 year old female who presents for follow-up.  Her cough has improved and is now infrequent, which she attributes to the use of Symbicort . She occasionally experiences chest congestion, which she can sometimes expectorate. She does not  currently have albuterol  she will need prescription for this.  She has been using a CPAP machine with good compliance. Occasionally, the machine makes noise, but she adjusts it and continues to use it. She sometimes removes it about 30 minutes before getting up due to discomfort. Initially, she received a CPAP mask with magnetic clips, which she had to return, and is now using a hybrid mask that covers her nose and mouth, which is working well for her.  She is undergoing physical therapy for her arm due to tightness and cording, which she attributes to past radiation treatment for breast cancer. The therapy is aimed at loosening the skin and improving mobility, and she notes some improvement. She practices posture exercises to aid her breathing and mobility.     Compliance download shows 100% usage and 30 days she is currently on AirSense 11 AutoSet at 5 to 15 cm H2O.  She ranges in pressures between 7 to 14 cm H2O.  Residual AHI is 1.6.  Leaks are held at a minimum.  DATA: 09/24/2019 2D echo: LVEF 60 to 65%, normal LV function, indeterminate LV diastolic parameters. Mildly elevated pulmonary artery systolic pressure. 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  Very mild cylindrical bronchiectasis in the lungs bilaterally with some mucus impaction right lower lobe.  Mediastinal adenopathy. 02/17/2020 PFT: Normal spirometry and lung volumes 04/25/2020 HST: Moderate obstructive sleep apnea with AHI of 15.4 and SpO2 low of 76%, patient placed on auto CPAP at 5 to 20 cm H2O 08/31/2021 2D echo: LVEF 60 to 65%, grade I DD, mildly elevated pulmonary artery systolic pressure.  In essence, unchanged from prior 05/27/2023 PFTs: FEV1 2.15 L or 117% predicted, FVC 2.41 L or 98% predicted, FEV1/FVC 89% lung volumes were normal diffusion capacity mildly correcting by alveolar volume to 75%    Review of Systems A 10 point review of systems was performed and it is as  noted above otherwise negative.   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/2023   Acute diverticulitis 03/21/2023   Abdominal pain, left lower quadrant 03/16/2023   Burning with urination 03/07/2023   Overweight (BMI 25.0-29.9) 03/05/2022   Elevated ferritin 03/23/2021   Decreased GFR 02/23/2021   Vitamin B 12 deficiency 02/23/2021   Leukopenia 02/23/2021   Bronchiectasis without complication (HCC) 01/26/2020   Aortic atherosclerosis (HCC) 12/23/2019   Atherosclerosis of coronary artery of native heart without angina pectoris 12/23/2019   Peripheral edema 12/22/2019   Chemotherapy-induced peripheral neuropathy (HCC) 10/29/2019   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   Anemia 10/15/2018   Diabetic retinopathy (HCC) 09/29/2018   Family history of leukemia 09/16/2018   Family history of breast cancer    Malignant neoplasm of upper-outer quadrant of left breast in female, 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/11/2018   Family history of breast cancer in sister 08/28/2018   Diverticular disease 10/17/2017   Primary osteoarthritis of right knee 03/07/2017   HSV-1 (herpes simplex virus 1) infection, 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   Vitamin D  deficiency 10/11/2016   Family history of thyroid  disorder 04/04/2015   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   Counseling regarding end of life decision making 10/04/2014   OA (osteoarthritis) of neck 08/02/2014   History of duodenal ulcer 08/02/2014   Seasonal allergies 08/02/2014   Hypertension associated with diabetes (HCC) 08/02/2014   Hyperlipidemia associated with type 2 diabetes mellitus (HCC) 08/02/2014   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   History of joint  replacement 03/01/2014   Diabetes mellitus type 2 with retinopathy (HCC) 11/07/2009    Social History   Tobacco Use   Smoking status: Never   Smokeless tobacco: Never  Substance Use Topics   Alcohol use: Not Currently    Alcohol/week: 0.0 standard drinks of alcohol    Comment: rarely    Allergies  Allergen Reactions   Erythromycin Anaphylaxis    REACTION: Rash, hives   Ciprofloxacin Rash   Daypro [Oxaprozin] Rash   Enalapril Maleate Nausea Only and Rash   Nsaids Nausea Only   Vioxx [Rofecoxib] Nausea Only    Current Meds  Medication Sig   ACCU-CHEK GUIDE TEST test strip Use to check blood sugar up to 2 times of day   albuterol  (VENTOLIN  HFA) 108 (90 Base) MCG/ACT inhaler Inhale 2 puffs into the lungs every 6 (six) hours as needed.   alendronate  (FOSAMAX ) 70 MG tablet TAKE 1 TABLET(70 MG) BY MOUTH 1 TIME A WEEK WITH A FULL GLASS OF WATER AND ON AN EMPTY STOMACH   anastrozole  (ARIMIDEX ) 1 MG tablet TAKE 1 TABLET(1 MG) BY MOUTH DAILY   aspirin  EC 81 MG tablet Take 81 mg by mouth daily. Swallow whole.   atorvastatin  (LIPITOR) 40 MG tablet Take 1 tablet (40 mg total) by mouth daily.   Calcium  Carbonate-Vit D-Min (CALTRATE 600+D PLUS MINERALS) 600-800 MG-UNIT TABS Take 1 tablet by mouth in the morning and  at bedtime.   clobetasol (TEMOVATE) 0.05 % external solution Apply 1 application topically 2 (two) times daily as needed (scalp). Reported on 08/16/2015   Dulaglutide  (TRULICITY ) 3 MG/0.5ML SOAJ Inject 3 mg as directed once a week.   fenofibrate  (TRICOR ) 145 MG tablet Take 1 tablet (145 mg total) by mouth daily.   fluticasone  (FLONASE ) 50 MCG/ACT nasal spray SHAKE LIQUID AND USE 2 SPRAYS IN EACH NOSTRIL EVERY DAY   gabapentin  (NEURONTIN ) 300 MG capsule TAKE 1 CAPSULE(300 MG) BY MOUTH AT BEDTIME   JARDIANCE  10 MG TABS tablet TAKE 1 TABLET(10 MG) BY MOUTH DAILY BEFORE BREAKFAST   loratadine  (CLARITIN ) 10 MG tablet Take 10 mg by mouth daily.   losartan -hydrochlorothiazide  (HYZAAR)  100-25 MG tablet TAKE 1 TABLET BY MOUTH DAILY   metroNIDAZOLE (METROCREAM) 0.75 % cream Apply 1 Application topically 2 (two) times daily as needed.   Multiple Vitamins-Minerals (CENTRUM SILVER 50+WOMEN) TABS Take 1 tablet by mouth daily.   Polyethyl Glycol-Propyl Glycol (SYSTANE OP) Apply to eye as needed.   Probiotic Product (PROBIOTIC DAILY PO) Take by mouth. Schiff Digestive Advantage   psyllium (METAMUCIL) 58.6 % powder Take 1 packet by mouth daily.   valACYclovir  (VALTREX ) 500 MG tablet TAKE 1 TABLET(500 MG) BY MOUTH TWICE DAILY   [DISCONTINUED] budesonide -formoterol  (SYMBICORT ) 160-4.5 MCG/ACT inhaler Inhale 2 puffs into the lungs in the morning and at bedtime.    Immunization History  Administered Date(s) Administered    sv, Bivalent, Protein Subunit Rsvpref,pf (Abrysvo) 06/04/2022   Fluad Quad(high Dose 65+) 04/30/2019, 05/12/2020, 04/06/2022, 04/19/2023   Hep A / Hep B 05/29/2009, 07/03/2009, 11/24/2009, 11/28/2021   Hepb-cpg 10/26/2021, 11/28/2021   INFLUENZA, HIGH DOSE SEASONAL PF 04/18/2014, 04/13/2015, 04/30/2018, 04/10/2021   Influenza, Seasonal, Injecte, Preservative Fre 06/26/2016   Influenza-Unspecified 05/17/2009, 04/02/2011, 05/20/2012, 06/02/2013, 07/01/2013, 04/18/2014, 04/13/2015, 06/27/2016, 04/11/2017   Moderna Covid-19 Vaccine Bivalent Booster 82yrs & up 04/06/2022   PFIZER Comirnaty(Gray Top)Covid-19 Tri-Sucrose Vaccine 11/29/2020   PFIZER(Purple Top)SARS-COV-2 Vaccination 10/06/2019, 10/27/2019, 04/24/2020   Pfizer Covid-19 Vaccine Bivalent Booster 17yrs & up 05/02/2021   Pfizer(Comirnaty)Fall Seasonal Vaccine 12 years and older 05/02/2023   Pneumococcal Conjugate-13 04/18/2014   Pneumococcal Polysaccharide-23 10/06/2015   Td 05/17/2009, 05/12/2020   Typhoid Inactivated 05/17/2009   Unspecified SARS-COV-2 Vaccination 06/04/2022   Yellow Fever 05/17/2009   Zoster Recombinant(Shingrix) 06/07/2021, 08/11/2021   Zoster, Live 04/02/2011        Objective:      BP (!) 128/4 (BP Location: Left Arm, Patient Position: Sitting, Cuff Size: Normal)   Pulse 84   Temp 97.9 F (36.6 C) (Oral)   Ht 5' 2.5 (1.588 m)   Wt 156 lb (70.8 kg)   LMP 08/26/1992 (Approximate)   SpO2 96%   BMI 28.08 kg/m   SpO2: 96 %  GENERAL: Awake, alert, fully ambulatory, no acute distress.  No conversational dyspnea. HEAD: Normocephalic, atraumatic. EYES: Pupils equal, round, reactive to light.  No scleral icterus. MOUTH: Dentition intact.  No thrush.  Oral mucosa moist. NECK: Supple. No thyromegaly. Trachea midline. No JVD.  No adenopathy. PULMONARY: Excellent air entry bilaterally.  Lungs clear to auscultation bilaterally. CARDIOVASCULAR: S1 and S2. Regular rate and rhythm.  No rubs murmurs or gallops heard. GASTROINTESTINAL: Benign. MUSCULOSKELETAL: No joint deformity, no clubbing, no edema. NEUROLOGIC: Awake, alert, no focal deficits.  Speech is fluent.  No gait disturbance noted, fully ambulatory. SKIN: Intact,warm,dry. PSYCH: Mood and behavior appropriate.        Assessment & Plan:     ICD-10-CM   1. Cough  variant asthma  J45.991     2. Bronchiectasis without complication (HCC)  J47.9     3. OSA on CPAP  G47.33      Meds ordered this encounter  Medications   albuterol  (VENTOLIN  HFA) 108 (90 Base) MCG/ACT inhaler    Sig: Inhale 2 puffs into the lungs every 6 (six) hours as needed.    Dispense:  8 g    Refill:  2   Discussion:    Cough variant asthma and bronchiectasis Cough variant asthma and bronchiectasis are partially controlled. She reports reduced cough frequency with Symbicort . Occasional congestion resolves with coughing. She agrees albuterol  may help with congestion. Physical therapy for a frozen shoulder may indirectly benefit respiratory function by improving posture and reducing chest tightness. - Prescribe albuterol  inhaler for use as needed for congestion - Continue Symbicort  - Continue physical therapy for shoulder  mobility  Obstructive sleep apnea managed with CPAP Obstructive sleep apnea is well-managed with CPAP therapy. Compliance is excellent, effectively controlling the apnea-hypopnea index. Discomfort leads to early removal in the morning, but overall, therapy is effective. Mask compatibility issues were resolved by switching to a hybrid mask, which is working well. - Continue CPAP therapy with current hybrid mask - Follow up in six months     Advised if symptoms do not improve or worsen, to please contact office for sooner follow up or seek emergency care.    I spent 32 minutes of dedicated to the care of this patient on the date of this encounter to include pre-visit review of records, face-to-face time with the patient discussing conditions above, post visit ordering of testing, clinical documentation with the electronic health record, making appropriate referrals as documented, and communicating necessary findings to members of the patients care team.     C. Leita Sanders, MD Advanced Bronchoscopy P[facility]    *This note was generated using voice recognition software/Dragon and/or AI transcription program.  Despite best efforts to proofread, errors can occur which can change the meaning. Any transcriptional errors that result from this process are unintentional and may not be fully corrected at the time of dictation.

## 2024-04-09 NOTE — Patient Instructions (Signed)
 VISIT SUMMARY:  During today's visit, we discussed your ongoing management of asthma, COPD with emphysema, and obstructive sleep apnea. You reported improvements in your symptoms and compliance with your treatment plans. We also reviewed your physical therapy progress for your shoulder.  YOUR PLAN:  -ASTHMA AND CHRONIC OBSTRUCTIVE PULMONARY DISEASE WITH EMPHYSEMA: Asthma and COPD with emphysema are conditions that cause difficulty breathing due to narrowed airways and damaged lung tissue. Your symptoms are partially controlled with Symbicort , which has reduced your cough. You will start using an albuterol  inhaler as needed for chest congestion. Continue your current use of Symbicort  and physical therapy for your left shoulder, which may also help your breathing by improving your posture.  -OBSTRUCTIVE SLEEP APNEA MANAGED WITH CPAP: Obstructive sleep apnea is a condition where your breathing stops and starts during sleep due to blocked airways. Your CPAP therapy is effectively managing this condition, and you are using the machine well. Continue using your CPAP machine with the hybrid mask, and we will follow up in six months to monitor your progress.  INSTRUCTIONS:  Please follow up in six months to review your progress with CPAP therapy. Continue using Symbicort  and start using the albuterol  inhaler as needed for chest congestion. Keep up with your physical therapy exercises for your left shoulder.

## 2024-04-15 ENCOUNTER — Ambulatory Visit

## 2024-04-15 DIAGNOSIS — R252 Cramp and spasm: Secondary | ICD-10-CM

## 2024-04-15 DIAGNOSIS — Z170 Estrogen receptor positive status [ER+]: Secondary | ICD-10-CM

## 2024-04-15 DIAGNOSIS — C50412 Malignant neoplasm of upper-outer quadrant of left female breast: Secondary | ICD-10-CM | POA: Diagnosis not present

## 2024-04-15 DIAGNOSIS — M25612 Stiffness of left shoulder, not elsewhere classified: Secondary | ICD-10-CM

## 2024-04-15 DIAGNOSIS — L599 Disorder of the skin and subcutaneous tissue related to radiation, unspecified: Secondary | ICD-10-CM

## 2024-04-15 NOTE — Therapy (Signed)
 OUTPATIENT PHYSICAL THERAPY  UPPER EXTREMITY ONCOLOGY TREATMENT  Patient Name: Katie [name] MRN: [id number] DOB:1946/04/25, 78 y.o., female Today's Date: 04/15/2024  END OF SESSION:  PT End of Session - 04/15/24 1118     Visit Number 10    Number of Visits 15    Date for PT Re-Evaluation 05/18/24    Authorization Type [employer] approved 6 visits 04/06/24- 05/21/24    Authorization Time Period auth 20 visits  4/16-6/20  Authorization #[id number]    Authorization - Visit Number 2    Authorization - Number of Visits 6    Progress Note Due on Visit 19    PT Start Time 1109    PT Stop Time 1202    PT Time Calculation (min) 53 min    Activity Tolerance Patient tolerated treatment well    Behavior During Therapy WFL for tasks assessed/performed           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, small, not treating-watching , it is now stented   Blood transfusion without reported diagnosis    Cancer (HCC)    breast 08/2018   Coronary artery disease    Diverticulitis    DJD (degenerative joint disease)    DM type 2 (diabetes mellitus, type 2) (HCC)    Family history of breast cancer    Fibrocystic breast disease    GERD (gastroesophageal reflux disease)    History of chicken pox    Hypercholesteremia    Hypertension    Lichen planopilaris    Neuromuscular disorder (HCC)    Sleep apnea    cpap   Urinary incontinence    Vertigo 12/2019   1 episode   Past Surgical History:  Procedure Laterality Date   BREAST IMPLANT REMOVAL Bilateral 03/08/2019   Procedure: Removal of Bilateral Breast Implants;  Surgeon: Ebbie Cough, MD;  Location: [facility] OR;  Service: General;  Laterality: Bilateral;   BREAST SURGERY  02/1975   Left Breast Biopsy-Fibrocystic Disease   BREAST SURGERY  10/1981   Bialteral Capsulectomy Breast Surgery   BREAST SURGERY  07/1998   Replace Bilateral Silicone Implants   BREAST SURGERY  01/1976   Bilater  breast surgery to remove fibrocystic tissue and replace with silicone implants   CATARACT EXTRACTION W/PHACO Right 02/08/2020   Procedure: CATARACT EXTRACTION PHACO AND INTRAOCULAR LENS PLACEMENT (IOC) RIGHT DIABETIC;  Surgeon: Jaye Fallow, MD;  Location: [facility];  Service: Ophthalmology;  Laterality: Right;  3.55 0:33.7   CATARACT EXTRACTION W/PHACO Left 02/29/2020   Procedure: CATARACT EXTRACTION PHACO AND INTRAOCULAR LENS PLACEMENT (IOC) LEFT DIABETIC 2.93  00:27.1;  Surgeon: Jaye Fallow, MD;  Location: [facility];  Service: Ophthalmology;  Laterality: Left;  Diabetic - oral meds   CHOLECYSTECTOMY N/A 07/08/2016   Procedure: LAPAROSCOPIC CHOLECYSTECTOMY;  Surgeon: Carlin Pastel, MD;  Location: [facility] ORS;  Service: General;  Laterality: N/A;   COLONOSCOPY WITH PROPOFOL  N/A 11/07/2017   Procedure: COLONOSCOPY WITH PROPOFOL ;  Surgeon: Therisa Bi, MD;  Location: [facility] END[facility]COPY;  Service: Gastroenterology;  Laterality: N/A;   DILATION AND CURETTAGE OF UTERUS  01/1994   ESOPHAGOGASTRODUODENOSCOPY (EGD) WITH PROPOFOL  N/A 12/05/2016   Procedure: ESOPHAGOGASTRODUODENOSCOPY (EGD) WITH PROPOFOL ;  Surgeon: Bi Therisa, MD;  Location: [facility] ENDOSCOPY;  Service: Endoscopy;  Laterality: N/A;   FINGER SURGERY  2001 & 2003   Trigger Finger   FOOT SURGERY  1980   To Relieve Pinched Nerve  FOOT SURGERY  07/2008   Left Foot-Plantar Fasciitis   JOINT REPLACEMENT  04/19/2013   Hip Replacement-Right   MASTECTOMY     double   MASTECTOMY W/ SENTINEL NODE BIOPSY Bilateral 03/08/2019   Procedure: BILATERAL MASTECTOMIES WITH LEFT AXILLARY SENTINEL LYMPH NODE BIOPSY AND BLUE DYE INJECTION;  Surgeon: Ebbie Cough, MD;  Location: [facility] OR;  Service: General;  Laterality: Bilateral;   PARTIAL KNEE ARTHROPLASTY Right 12/31/2022   Procedure: RIGHT PARTIAL KNEE ARTHROPLASTY;  Surgeon: Edie Norleen PARAS, MD;  Location: [facility] ORS;  Service: Orthopedics;  Laterality: Right;   PLACEMENT OF  BREAST IMPLANTS  12/26/2004   Replace Failed Implants   REPLACEMENT TOTAL KNEE Left    2019-   RHINOPLASTY  03/1964   To Correct Deviated Septum   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/2023   Acute diverticulitis 03/21/2023   Abdominal pain, left lower quadrant 03/16/2023   Burning with urination 03/07/2023   Overweight (BMI 25.0-29.9) 03/05/2022   Elevated ferritin 03/23/2021   Decreased GFR 02/23/2021   Vitamin B 12 deficiency 02/23/2021   Leukopenia 02/23/2021   Bronchiectasis without complication (HCC) 01/26/2020   Aortic atherosclerosis (HCC) 12/23/2019   Atherosclerosis of coronary artery of native heart without angina pectoris 12/23/2019   Peripheral edema 12/22/2019   Chemotherapy-induced peripheral neuropathy (HCC) 10/29/2019   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   Anemia 10/15/2018   Diabetic retinopathy (HCC) 09/29/2018   Family history of leukemia 09/16/2018   Family history of breast cancer    Malignant neoplasm of upper-outer quadrant of left breast in female, 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/11/2018   Family history of breast cancer in sister 08/28/2018   Diverticular disease 10/17/2017   Primary osteoarthritis of right knee 03/07/2017   HSV-1 (herpes simplex virus 1) infection, 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   Vitamin D  deficiency 10/11/2016   Family history of thyroid  disorder 04/04/2015   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   Counseling regarding end of life decision making 10/04/2014   OA (osteoarthritis) of neck 08/02/2014   History of duodenal ulcer 08/02/2014   Seasonal allergies 08/02/2014   Hypertension associated with diabetes (HCC) 08/02/2014   Hyperlipidemia  associated with type 2 diabetes mellitus (HCC) 08/02/2014   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   History of joint replacement 03/01/2014   Diabetes mellitus type 2 with retinopathy (HCC) 11/07/2009    PCP: Greig Ring, MD  REFERRING PROVIDER: Kip Lynwood Double, PA-C  REFERRING DIAG: R49.587 L breast cancer/ cording  THERAPY DIAG:  Stiffness of left shoulder, not elsewhere classified  Disorder of the skin and subcutaneous tissue related to radiation, unspecified  Cramp and spasm  Malignant neoplasm of upper-outer quadrant of left breast in female, estrogen receptor positive (HCC)  ONSET DATE: 03/08/2019  Rationale for Evaluation and Treatment: Rehabilitation  SUBJECTIVE:  SUBJECTIVE STATEMENT: I got the cups for cupping and my husband has been doing it really good. I have trouble doing it on myself but would like to learn because it has really helped loosen the skin on my Lt chest wall. I know it won't ever be as loose as the Rt side, but it has been feeling so much better.   PERTINENT HISTORY: In 2020 she underwent primary chemotherapy for a triple positive left breast cancer. This was followed by bilateral mastectomies and a sentinel lymph node biopsy  March 08 2019. 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 The right mastectomy was negative. The pathologist lost the nodes so there is no pathology on the nodes. This was followed by adjuvant radiation therapy and she is now on anastrozole  doing well. Due to take that for 7 years. SABRA DJD (degenerative joint disease)  DM II (diabetes mellitus, type II), controlled. R partial knee arthroplasty 12/31/2022, L total knee 2019, 04/19/2013 R hip replacement, has stent for cerebral aneurysm   PAIN:  Are you  having pain? No, not currently.   PRECAUTIONS: Other: L UE lymphedema risk, L total knee 2019, R partial knee 2024, R THA 2014, has stent for cerebral aneurysm  RED FLAGS: None   WEIGHT BEARING RESTRICTIONS: No  FALLS:  Has patient fallen in last 6 months? No  LIVING ENVIRONMENT: Lives with: lives with their spouse Lives in: House/apartment Stairs: Yes; Internal: 12 steps; can reach both and External: 5 steps; can reach both Has following equipment at home: Grab bars  OCCUPATION: retried  LEISURE: belongs to [employer] 2-4 days/week, does Nu[employer], treadmill, recumbent bike, weight machines  HAND DOMINANCE: right   PRIOR LEVEL OF FUNCTION: Independent  PATIENT GOALS: to loosen the muscle to improve mobility, loosen scar tissue   OBJECTIVE: Note: Objective measures were completed at Evaluation unless otherwise noted.  COGNITION: Overall cognitive status: Within functional limits for tasks assessed   PALPATION: Tightness palpable especially at lateral edge of scar, cording at L axilla  OBSERVATIONS / OTHER ASSESSMENTS: telangiectasias visible at L chest  POSTURE: rounded shoulders, forward head  UPPER EXTREMITY AROM/PROM:  A/PROM RIGHT   eval   Shoulder extension 71  Shoulder flexion 162  Shoulder abduction 150  Shoulder internal rotation 67  Shoulder external rotation 76    (Blank rows = not tested)  A/PROM LEFT   eval Left 04/01/24  Shoulder extension 50   Shoulder flexion 147 with p! 152  Shoulder abduction 135 146  Shoulder internal rotation 55   Shoulder external rotation 79     (Blank rows = not tested)    UPPER EXTREMITY STRENGTH: R 5/5, L 4/5  LYMPHEDEMA ASSESSMENTS:   SURGERY TYPE/DATE: bilateral mastectomy and SLNB 03/08/19  NUMBER OF LYMPH NODES REMOVED: 1 - was lost so no pathology  CHEMOTHERAPY: completed  RADIATION:completed  HORMONE TREATMENT: on anastrozole   INFECTIONS: none   LYMPHEDEMA ASSESSMENTS:   LANDMARK RIGHT   eval  At axilla  34.5  15 cm proximal to olecranon process 31.4  10 cm proximal to olecranon process 28  Olecranon process 24.2  15 cm proximal to ulnar styloid process 23  10 cm proximal to ulnar styloid process 20.9  Just proximal to ulnar styloid process 17.5  Across hand at thumb web space 18.5  At base of 2nd digit 6.6  (Blank rows = not tested)  LANDMARK LEFT  eval  At axilla  34.5  15  cm proximal to olecranon process 33  10 cm proximal to olecranon process 30.2  Olecranon process 26  15 cm proximal to ulnar styloid process 23.6  10 cm proximal to ulnar styloid process 21  Just proximal to ulnar styloid process 17  Across hand at thumb web space 18.3  At base of 2nd digit 6.5  (Blank rows = not tested)   QUICK DASH:  03/09/24: 20.45% 04/01/24:                                                                                                                                 TREATMENT DATE:  04/15/24: Therapeutic Exercise Pulleys into flex and abd x 2 mins each returning therapist demo with VC's to stretch to end range Roll yellow ball up wall into flex with and then Lt UE abd x 10 each returning therapist demo Therapeutic Activities Free Motion Machine: Bil scap retract 7# 2 x 10 reps, alt flexion 3# x 10 then alt UE ext 7# x 12 Standing with back against wall and core engaged for bil UE er 2 x 10 yellow theraband Wall Push Ups x 10 returning therapist demo  Manual Therapy and Self Care (with instruction for cupping) P/ROM of Lt shoulder into flex, abduction, and D2 with scapular depression by therapist throughout MFR along tight Lt pect tendon and at axilla STM first seated in chair in front of mirror to teach pt how to perform this on herself, she wasn't squeezing the cup before skin contact so once she was able to use correctly, she was able to return good cupping technique; then transitioned to supine using cocoa butter to Lt pec to help decrease tightness and gentle  cupping moving cup in all directions to help loosen fascia x 8 mins over pec, lateral trunk, and into axilla as cup allowed on curved surfaces   04/06/24: Therapeutic Exercise Pulleys into flex and abd x 2 mins each returning therapist demo with VC's to stretch to end range Roll yellow ball up wall into flex and then Lt UE abd x 10 each returning therapist demo Therapeutic Activities Free Motion Machine: Bil scap retract 7# x 10 reps, then UE ext 7# on R then on Lx 10 reps with core engaged, flexion x 3# x 10, ER bilaterally x 3 lbs but pt had increased difficulty on R so had pt do R with no resistance, x 10 on L.  Manual Therapy P/ROM of Lt shoulder into flex, and abduction MFR along tight Lt pect tendon STM in supine using cocoa butter to Lt pec to help decrease tightness and gentle suction cupping moving cup in all directions to help loosen fascia x 8 mins over pec and lateral trunk  04/01/24: Therapeutic Exercise Pulleys into flex and abd x 2 mins each returning therapist demo with VC's to stretch to end range Roll yellow ball up wall into flex and then Lt UE abd x 10 each  returning therapist demo Modified downward dog on wall x 5 reps, 5 sec holds returning therapist demo Therapeutic Activities Free Motion Machine: Bil scap retract 7# x 10 reps, then bil UE ext 3# (trouble with technique with 7#) x 10 reps with core engaged Self Care Assessed current goals and current functional status, and reassessing A/ROM.  Manual Therapy P/ROM of Lt shoulder into flex, abd, and D2 with scapular depression throughout by therapist MFR along tight Lt pect tendon STM in supine using cocoa butter to Lt pec to help decrease tightness and gentle suction cupping moving cup in all directions to help loosen fascia x 5 mins over pec and lateral trunk       PATIENT EDUCATION:  Education details: Supine Scap Series with yellow theraband Person educated: Patient Education method: Explanation,  Demonstration, VC's and handout issued Education comprehension: verbalized understanding, returned demonstration, tactile and VC's and pt will benefit from further review  HOME EXERCISE PROGRAM: Supine dowel flexion and abduction x 10 reps each with 5-15 sec holds 03/16/24 - Doorway pectoralis stretch 03/22/24 - supine scap series with yellow theraband  ASSESSMENT:  CLINICAL IMPRESSION: Continued with postural strength with focus on correct scapular engagement. Then also continued manual therapy working to further decrease fascial restrictions at Lt chest wall. Instructed pt while she was sitting in front of mirror in how to perform cupping on self. She was able to return good demo and was reminded not cup for longer than around 5 mins. Pt able to verbalize good understanding.    OBJECTIVE IMPAIRMENTS: decreased knowledge of condition, decreased ROM, decreased strength, increased muscle spasms, impaired UE functional use, postural dysfunction, and pain.   ACTIVITY LIMITATIONS: carrying, lifting, and reach over head  PARTICIPATION LIMITATIONS: cleaning, laundry, and community activity  PERSONAL FACTORS: Past/current experiences and Time since onset of injury/illness/exacerbation are also affecting patient's functional outcome.   REHAB POTENTIAL: Good  CLINICAL DECISION MAKING: Stable/uncomplicated  EVALUATION COMPLEXITY: Low  GOALS: Goals reviewed with patient? Yes  SHORT TERM GOALS: Target date: 03/23/24  Pt will be able to verbalize lymphedema risk reduction practices.  Baseline: Goal status: MET  2.  Pt will be independent in initial home exercise program for stretching and strengthening.  Baseline:  Goal status: MET   LONG TERM GOALS: Target date: 04/06/24  Pt will demonstrate 160 degrees of L shoulder flexion to allow her to reach overhead.  Baseline: 147 degrees with p!; 04/01/24 - 152 degrees Goal status: ONGOING  2.  Pt will demonstrate 160 degrees of L shoulder  abduction to allow her to reach out to the side. Baseline: 135 degrees; 04/01/24 - 146 degrees  Goal status: ONGOING  3.  Pt will report a 75% improvement in feelings of tightness with UE overhead motion to allow improved comfort. Baseline: 04/01/24 - 50-60% Goal status: ONGOING  4.  Pt will be able to raise L arm without increased pain to improve comfort. Baseline:  Goal status: MET  5.  Pt will be independent in a home exercise program for continued stretching and strengthening.  Baseline: 04/01/24 - Pt is independent with initial HEP and will benefit from progression of this to include gym exs education Goal status: ONGOING   PLAN:  PT FREQUENCY: 1x/wk  PT DURATION: 6 weeks  PLANNED INTERVENTIONS: 97164- PT Re-evaluation, 97110-Therapeutic exercises, 97530- Therapeutic activity, 97535- Self Care, 02859- Manual therapy, 97760- Orthotic Initial, [id number]- Orthotic/Prosthetic subsequent, and Manual lymph drainage  PLAN FOR NEXT SESSION: Cont pulleys here to assess technique prn, ball,  foam roll for pec stretch, Cont cupping for fascial restrictions and adhesions; how is she doing with this at home?; MFR cording, AA/A/P/ROM  Aden Berwyn Caldron, PTA 04/15/2024, 1:19 PM   CHEST: Doorway, Bilateral - Standing    Stand with one foot in front of the other. Standing in doorway, place hands on wall with elbows bent at shoulder height. Lean forward. Hold _20-30__ seconds. _2-3__ reps per set, _4__ sets per day  Cancer Rehab [phone]

## 2024-04-17 ENCOUNTER — Other Ambulatory Visit: Payer: Self-pay | Admitting: Pulmonary Disease

## 2024-04-19 ENCOUNTER — Other Ambulatory Visit: Payer: Self-pay | Admitting: Pulmonary Disease

## 2024-04-19 NOTE — Telephone Encounter (Signed)
 Duplicated request -- already refilled by Dr. Tamea today.

## 2024-04-19 NOTE — Telephone Encounter (Signed)
 Copied from CRM #[id number]. Topic: Clinical - Medication Refill >> Apr 19, 2024 10:15 AM Russell PARAS wrote: Medication: budesonide -formoterol  (SYMBICORT ) 160-4.5 MCG/ACT inhaler   Has the patient contacted their pharmacy? Yes (Agent: If no, request that the patient contact the pharmacy for the refill. If patient does not wish to contact the pharmacy document the reason why and proceed with request.) (Agent: If yes, when and what did the pharmacy advise?)  This is the patient's preferred pharmacy:  [id number] - [place], [place] - [address] AT [facility] OF [address] [facility] [address] [place] [address] Phone: [phone] Fax: [phone]  Is this the correct pharmacy for this prescription? Yes If no, delete pharmacy and type the correct one.   Has the prescription been filled recently? Yes  Is the patient out of the medication? No  Has the patient been seen for an appointment in the last year OR does the patient have an upcoming appointment? Yes, last visit on 10/01/2023  Can we respond through MyChart? Yes  Agent: Please be advised that Rx refills may take up to 3 business days. We ask that you follow-up with your pharmacy.

## 2024-04-21 DIAGNOSIS — M1612 Unilateral primary osteoarthritis, left hip: Secondary | ICD-10-CM | POA: Diagnosis not present

## 2024-04-21 DIAGNOSIS — M7062 Trochanteric bursitis, left hip: Secondary | ICD-10-CM | POA: Diagnosis not present

## 2024-04-21 DIAGNOSIS — M76891 Other specified enthesopathies of right lower limb, excluding foot: Secondary | ICD-10-CM | POA: Diagnosis not present

## 2024-04-21 DIAGNOSIS — S76011D Strain of muscle, fascia and tendon of right hip, subsequent encounter: Secondary | ICD-10-CM | POA: Diagnosis not present

## 2024-04-21 DIAGNOSIS — M2170 Unequal limb length (acquired), unspecified site: Secondary | ICD-10-CM | POA: Diagnosis not present

## 2024-04-21 DIAGNOSIS — Z96651 Presence of right artificial knee joint: Secondary | ICD-10-CM | POA: Diagnosis not present

## 2024-04-22 ENCOUNTER — Ambulatory Visit

## 2024-04-22 DIAGNOSIS — Z170 Estrogen receptor positive status [ER+]: Secondary | ICD-10-CM | POA: Diagnosis not present

## 2024-04-22 DIAGNOSIS — M25612 Stiffness of left shoulder, not elsewhere classified: Secondary | ICD-10-CM

## 2024-04-22 DIAGNOSIS — R252 Cramp and spasm: Secondary | ICD-10-CM | POA: Diagnosis not present

## 2024-04-22 DIAGNOSIS — C50412 Malignant neoplasm of upper-outer quadrant of left female breast: Secondary | ICD-10-CM | POA: Diagnosis not present

## 2024-04-22 DIAGNOSIS — L599 Disorder of the skin and subcutaneous tissue related to radiation, unspecified: Secondary | ICD-10-CM | POA: Diagnosis not present

## 2024-04-22 NOTE — Therapy (Signed)
 OUTPATIENT PHYSICAL THERAPY  UPPER EXTREMITY ONCOLOGY TREATMENT  Patient Name: Katie [name] MRN: [id number] DOB:06-Jun-1946, 78 y.o., female Today's Date: 04/22/2024  END OF SESSION:  PT End of Session - 04/22/24 0908     Visit Number 11    Number of Visits 15    Date for PT Re-Evaluation 05/18/24    Authorization Type [employer] approved 6 visits 04/06/24- 05/21/24    Authorization Time Period auth 20 visits  4/16-6/20  Authorization #[id number]    Authorization - Visit Number 3    Authorization - Number of Visits 6    Progress Note Due on Visit 19    PT Start Time 0904    PT Stop Time 0958    PT Time Calculation (min) 54 min    Activity Tolerance Patient tolerated treatment well    Behavior During Therapy [facility] for tasks assessed/performed           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, small, not treating-watching , it is now stented   Blood transfusion without reported diagnosis    Cancer (HCC)    breast 08/2018   Coronary artery disease    Diverticulitis    DJD (degenerative joint disease)    DM type 2 (diabetes mellitus, type 2) (HCC)    Family history of breast cancer    Fibrocystic breast disease    GERD (gastroesophageal reflux disease)    History of chicken pox    Hypercholesteremia    Hypertension    Lichen planopilaris    Neuromuscular disorder (HCC)    Sleep apnea    cpap   Urinary incontinence    Vertigo 12/2019   1 episode   Past Surgical History:  Procedure Laterality Date   BREAST IMPLANT REMOVAL Bilateral 03/08/2019   Procedure: Removal of Bilateral Breast Implants;  Surgeon: Ebbie Cough, MD;  Location: [facility] OR;  Service: General;  Laterality: Bilateral;   BREAST SURGERY  02/1975   Left Breast Biopsy-Fibrocystic Disease   BREAST SURGERY  10/1981   Bialteral Capsulectomy Breast Surgery   BREAST SURGERY  07/1998   Replace Bilateral Silicone Implants   BREAST SURGERY  01/1976   Bilater  breast surgery to remove fibrocystic tissue and replace with silicone implants   CATARACT EXTRACTION W/PHACO Right 02/08/2020   Procedure: CATARACT EXTRACTION PHACO AND INTRAOCULAR LENS PLACEMENT (IOC) RIGHT DIABETIC;  Surgeon: Jaye Fallow, MD;  Location: [facility];  Service: Ophthalmology;  Laterality: Right;  3.55 0:33.7   CATARACT EXTRACTION W/PHACO Left 02/29/2020   Procedure: CATARACT EXTRACTION PHACO AND INTRAOCULAR LENS PLACEMENT (IOC) LEFT DIABETIC 2.93  00:27.1;  Surgeon: Jaye Fallow, MD;  Location: [facility];  Service: Ophthalmology;  Laterality: Left;  Diabetic - oral meds   CHOLECYSTECTOMY N/A 07/08/2016   Procedure: LAPAROSCOPIC CHOLECYSTECTOMY;  Surgeon: Carlin Pastel, MD;  Location: [facility] ORS;  Service: General;  Laterality: N/A;   COLONOSCOPY WITH PROPOFOL  N/A 11/07/2017   Procedure: COLONOSCOPY WITH PROPOFOL ;  Surgeon: Therisa Bi, MD;  Location: [facility] ENDOSCOPY;  Service: Gastroenterology;  Laterality: N/A;   DILATION AND CURETTAGE OF UTERUS  01/1994   ESOPHAGOGASTRODUODENOSCOPY (EGD) WITH PROPOFOL  N/A 12/05/2016   Procedure: ESOPHAGOGASTRODUODENOSCOPY (EGD) WITH PROPOFOL ;  Surgeon: Bi Therisa, MD;  Location: [facility] ENDOSCOPY;  Service: Endoscopy;  Laterality: N/A;   FINGER SURGERY  2001 & 2003   Trigger Finger   FOOT SURGERY  1980   To Relieve Pinched Nerve  FOOT SURGERY  07/2008   Left Foot-Plantar Fasciitis   JOINT REPLACEMENT  04/19/2013   Hip Replacement-Right   MASTECTOMY     double   MASTECTOMY W/ SENTINEL NODE BIOPSY Bilateral 03/08/2019   Procedure: BILATERAL MASTECTOMIES WITH LEFT AXILLARY SENTINEL LYMPH NODE BIOPSY AND BLUE DYE INJECTION;  Surgeon: Ebbie Cough, MD;  Location: [facility] OR;  Service: General;  Laterality: Bilateral;   PARTIAL KNEE ARTHROPLASTY Right 12/31/2022   Procedure: RIGHT PARTIAL KNEE ARTHROPLASTY;  Surgeon: Edie Norleen PARAS, MD;  Location: [facility] ORS;  Service: Orthopedics;  Laterality: Right;   PLACEMENT OF  BREAST IMPLANTS  12/26/2004   Replace Failed Implants   REPLACEMENT TOTAL KNEE Left    2019-   RHINOPLASTY  03/1964   To Correct Deviated Septum   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/2023   Acute diverticulitis 03/21/2023   Abdominal pain, left lower quadrant 03/16/2023   Burning with urination 03/07/2023   Overweight (BMI 25.0-29.9) 03/05/2022   Elevated ferritin 03/23/2021   Decreased GFR 02/23/2021   Vitamin B 12 deficiency 02/23/2021   Leukopenia 02/23/2021   Bronchiectasis without complication (HCC) 01/26/2020   Aortic atherosclerosis (HCC) 12/23/2019   Atherosclerosis of coronary artery of native heart without angina pectoris 12/23/2019   Peripheral edema 12/22/2019   Chemotherapy-induced peripheral neuropathy (HCC) 10/29/2019   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   Anemia 10/15/2018   Diabetic retinopathy (HCC) 09/29/2018   Family history of leukemia 09/16/2018   Family history of breast cancer    Malignant neoplasm of upper-outer quadrant of left breast in female, 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/11/2018   Family history of breast cancer in sister 08/28/2018   Diverticular disease 10/17/2017   Primary osteoarthritis of right knee 03/07/2017   HSV-1 (herpes simplex virus 1) infection, 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   Vitamin D  deficiency 10/11/2016   Family history of thyroid  disorder 04/04/2015   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   Counseling regarding end of life decision making 10/04/2014   OA (osteoarthritis) of neck 08/02/2014   History of duodenal ulcer 08/02/2014   Seasonal allergies 08/02/2014   Hypertension associated with diabetes (HCC) 08/02/2014   Hyperlipidemia  associated with type 2 diabetes mellitus (HCC) 08/02/2014   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   History of joint replacement 03/01/2014   Diabetes mellitus type 2 with retinopathy (HCC) 11/07/2009    PCP: Greig Ring, MD  REFERRING PROVIDER: Kip Lynwood Double, PA-C  REFERRING DIAG: R49.587 L breast cancer/ cording  THERAPY DIAG:  Stiffness of left shoulder, not elsewhere classified  Disorder of the skin and subcutaneous tissue related to radiation, unspecified  Cramp and spasm  Malignant neoplasm of upper-outer quadrant of left breast in female, estrogen receptor positive (HCC)  ONSET DATE: 03/08/2019  Rationale for Evaluation and Treatment: Rehabilitation  SUBJECTIVE:  SUBJECTIVE STATEMENT: My husband still helps me with cupping but I am able to do it on myself some as well. I can really feel it when I move it around on my chest with the suction. My pulleys are going well at home now too, I don't feel like I need to review that here anymore.   PERTINENT HISTORY: In 2020 she underwent primary chemotherapy for a triple positive left breast cancer. This was followed by bilateral mastectomies and a sentinel lymph node biopsy  March 08 2019. 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 The right mastectomy was negative. The pathologist lost the nodes so there is no pathology on the nodes. This was followed by adjuvant radiation therapy and she is now on anastrozole  doing well. Due to take that for 7 years. SABRA DJD (degenerative joint disease)  DM II (diabetes mellitus, type II), controlled. R partial knee arthroplasty 12/31/2022, L total knee 2019, 04/19/2013 R hip replacement, has stent for cerebral aneurysm   PAIN:  Are you having pain? No, not currently.    PRECAUTIONS: Other: L UE lymphedema risk, L total knee 2019, R partial knee 2024, R THA 2014, has stent for cerebral aneurysm  RED FLAGS: None   WEIGHT BEARING RESTRICTIONS: No  FALLS:  Has patient fallen in last 6 months? No  LIVING ENVIRONMENT: Lives with: lives with their spouse Lives in: House/apartment Stairs: Yes; Internal: 12 steps; can reach both and External: 5 steps; can reach both Has following equipment at home: Grab bars  OCCUPATION: retried  LEISURE: belongs to [employer] 2-4 days/week, does Nu[employer], treadmill, recumbent bike, weight machines  HAND DOMINANCE: right   PRIOR LEVEL OF FUNCTION: Independent  PATIENT GOALS: to loosen the muscle to improve mobility, loosen scar tissue   OBJECTIVE: Note: Objective measures were completed at Evaluation unless otherwise noted.  COGNITION: Overall cognitive status: Within functional limits for tasks assessed   PALPATION: Tightness palpable especially at lateral edge of scar, cording at L axilla  OBSERVATIONS / OTHER ASSESSMENTS: telangiectasias visible at L chest  POSTURE: rounded shoulders, forward head  UPPER EXTREMITY AROM/PROM:  A/PROM RIGHT   eval   Shoulder extension 71  Shoulder flexion 162  Shoulder abduction 150  Shoulder internal rotation 67  Shoulder external rotation 76    (Blank rows = not tested)  A/PROM LEFT   eval Left 04/01/24  Shoulder extension 50   Shoulder flexion 147 with p! 152  Shoulder abduction 135 146  Shoulder internal rotation 55   Shoulder external rotation 79     (Blank rows = not tested)    UPPER EXTREMITY STRENGTH: R 5/5, L 4/5  LYMPHEDEMA ASSESSMENTS:   SURGERY TYPE/DATE: bilateral mastectomy and SLNB 03/08/19  NUMBER OF LYMPH NODES REMOVED: 1 - was lost so no pathology  CHEMOTHERAPY: completed  RADIATION:completed  HORMONE TREATMENT: on anastrozole   INFECTIONS: none   LYMPHEDEMA ASSESSMENTS:   LANDMARK RIGHT  eval  At axilla  34.5  15  cm proximal to olecranon process 31.4  10 cm proximal to olecranon process 28  Olecranon process 24.2  15 cm proximal to ulnar styloid process 23  10 cm proximal to ulnar styloid process 20.9  Just proximal to ulnar styloid process 17.5  Across hand at thumb web space 18.5  At base of 2nd digit 6.6  (Blank rows = not tested)  LANDMARK LEFT  eval  At axilla  34.5  15 cm proximal to olecranon process  33  10 cm proximal to olecranon process 30.2  Olecranon process 26  15 cm proximal to ulnar styloid process 23.6  10 cm proximal to ulnar styloid process 21  Just proximal to ulnar styloid process 17  Across hand at thumb web space 18.3  At base of 2nd digit 6.5  (Blank rows = not tested)   QUICK DASH:  03/09/24: 20.45% 04/01/24:                                                                                                                                 TREATMENT DATE:  04/22/24: Therapeutic Exercises UE Ranger into flex x 10, very little stretch felt with abd so stopped Roll yellow ball up wall into flex with and then Lt UE abd x 10 each returning therapist demo Therapeutic Activities Free Motion Machine: Bil scap retract 7# 2 x 10 reps, alt flexion 3# x 10 then alt UE ext 7# x 12; Lt UE er 3# x 10 Wall Push Ups 2 x 10 returning therapist demo Manual Therapy P/ROM of Lt shoulder into flex, abduction, and D2 with scapular depression by therapist MFR along tight Lt pect tendon STM in supine using cocoa butter to Lt pec to help decrease tightness and gentle suction cupping moving cup in all directions to help loosen fascia over multiple areas of chest wall  04/15/24: Therapeutic Exercise Pulleys into flex and abd x 2 mins each returning therapist demo with VC's to stretch to end range Roll yellow ball up wall into flex with and then Lt UE abd x 10 each returning therapist demo Therapeutic Activities Free Motion Machine: Bil scap retract 7# 2 x 10 reps, alt flexion 3# x 10 then  alt UE ext 7# x 12 Standing with back against wall and core engaged for bil UE er 2 x 10 yellow theraband Wall Push Ups x 10 returning therapist demo  Manual Therapy and Self Care (with instruction for cupping) P/ROM of Lt shoulder into flex, abduction, and D2 with scapular depression by therapist throughout MFR along tight Lt pect tendon and at axilla STM first seated in chair in front of mirror to teach pt how to perform this on herself, she wasn't squeezing the cup before skin contact so once she was able to use correctly, she was able to return good cupping technique; then transitioned to supine using cocoa butter to Lt pec to help decrease tightness and gentle cupping moving cup in all directions to help loosen fascia x 8 mins over pec, lateral trunk, and into axilla as cup allowed on curved surfaces   04/06/24: Therapeutic Exercise Pulleys into flex and abd x 2 mins each returning therapist demo with VC's to stretch to end range Roll yellow ball up wall into flex and then Lt UE abd x 10 each returning therapist demo Therapeutic Activities Free Motion Machine: Bil scap retract 7# x 10 reps, then UE ext 7# on R  then on Lx 10 reps with core engaged, flexion x 3# x 10, ER bilaterally x 3 lbs but pt had increased difficulty on R so had pt do R with no resistance, x 10 on L.  Manual Therapy P/ROM of Lt shoulder into flex, and abduction MFR along tight Lt pect tendon STM in supine using cocoa butter to Lt pec to help decrease tightness and gentle suction cupping moving cup in all directions to help loosen fascia x 8 mins over pec and lateral trunk        PATIENT EDUCATION:  Education details: Supine Scap Series with yellow theraband Person educated: Patient Education method: Explanation, Demonstration, VC's and handout issued Education comprehension: verbalized understanding, returned demonstration, tactile and VC's and pt will benefit from further review  HOME EXERCISE PROGRAM: Supine  dowel flexion and abduction x 10 reps each with 5-15 sec holds 03/16/24 - Doorway pectoralis stretch 03/22/24 - supine scap series with yellow theraband  ASSESSMENT:  CLINICAL IMPRESSION: Pt is doing well with compliance with use of pulleys at home and cupping for tightness. Today continued with focus on postural strength and progressed some reps which she did well with. Pt reports understanding her LT chest wall will never feel as loose as the Rt but is happy she has seen some progress thus far as she didn't know what to expect at eval. She will benefit from a few more sessions of skilled therapy to further progress her postural strength finalizing her HEP and continuing with manual therapy working towards maximal improvement with Lt shoulder ROM.    OBJECTIVE IMPAIRMENTS: decreased knowledge of condition, decreased ROM, decreased strength, increased muscle spasms, impaired UE functional use, postural dysfunction, and pain.   ACTIVITY LIMITATIONS: carrying, lifting, and reach over head  PARTICIPATION LIMITATIONS: cleaning, laundry, and community activity  PERSONAL FACTORS: Past/current experiences and Time since onset of injury/illness/exacerbation are also affecting patient's functional outcome.   REHAB POTENTIAL: Good  CLINICAL DECISION MAKING: Stable/uncomplicated  EVALUATION COMPLEXITY: Low  GOALS: Goals reviewed with patient? Yes  SHORT TERM GOALS: Target date: 03/23/24  Pt will be able to verbalize lymphedema risk reduction practices.  Baseline: Goal status: MET  2.  Pt will be independent in initial home exercise program for stretching and strengthening.  Baseline:  Goal status: MET   LONG TERM GOALS: Target date: 04/06/24  Pt will demonstrate 160 degrees of L shoulder flexion to allow her to reach overhead.  Baseline: 147 degrees with p!; 04/01/24 - 152 degrees Goal status: ONGOING  2.  Pt will demonstrate 160 degrees of L shoulder abduction to allow her to reach out to  the side. Baseline: 135 degrees; 04/01/24 - 146 degrees  Goal status: ONGOING  3.  Pt will report a 75% improvement in feelings of tightness with UE overhead motion to allow improved comfort. Baseline: 04/01/24 - 50-60% Goal status: ONGOING  4.  Pt will be able to raise L arm without increased pain to improve comfort. Baseline:  Goal status: MET  5.  Pt will be independent in a home exercise program for continued stretching and strengthening.  Baseline: 04/01/24 - Pt is independent with initial HEP and will benefit from progression of this to include gym exs education Goal status: ONGOING   PLAN:  PT FREQUENCY: 1x/wk  PT DURATION: 6 weeks  PLANNED INTERVENTIONS: 97164- PT Re-evaluation, 97110-Therapeutic exercises, 97530- Therapeutic activity, 97535- Self Care, [id number]- Manual therapy, 97760- Orthotic Initial, [id number]- Orthotic/Prosthetic subsequent, and Manual lymph drainage  PLAN FOR NEXT SESSION: Stop  pulleys here as pt is independent with these now at home; Cont ball, foam roll for pec stretch, Cont cupping for fascial restrictions and adhesions; how is she doing with this at home?; MFR cording, AA/A/P/ROM  Aden Berwyn Caldron, PTA 04/22/2024, 11:35 AM   CHEST: Doorway, Bilateral - Standing    Stand with one foot in front of the other. Standing in doorway, place hands on wall with elbows bent at shoulder height. Lean forward. Hold _20-30__ seconds. _2-3__ reps per set, _4__ sets per day  Cancer Rehab [phone]

## 2024-04-24 ENCOUNTER — Encounter: Payer: Self-pay | Admitting: Pulmonary Disease

## 2024-04-27 ENCOUNTER — Ambulatory Visit: Attending: General Surgery

## 2024-04-27 DIAGNOSIS — Z170 Estrogen receptor positive status [ER+]: Secondary | ICD-10-CM | POA: Insufficient documentation

## 2024-04-27 DIAGNOSIS — R252 Cramp and spasm: Secondary | ICD-10-CM | POA: Diagnosis not present

## 2024-04-27 DIAGNOSIS — L599 Disorder of the skin and subcutaneous tissue related to radiation, unspecified: Secondary | ICD-10-CM | POA: Insufficient documentation

## 2024-04-27 DIAGNOSIS — C50412 Malignant neoplasm of upper-outer quadrant of left female breast: Secondary | ICD-10-CM | POA: Diagnosis not present

## 2024-04-27 DIAGNOSIS — M25612 Stiffness of left shoulder, not elsewhere classified: Secondary | ICD-10-CM | POA: Insufficient documentation

## 2024-04-27 NOTE — Patient Instructions (Signed)
 3 Way Raises:      Starting Position:  Leaning against wall, walk feet a few inches away from the wall and make tummy tight (tuck hips underneath you) Press back/shoulders/head against wall as much as possible. Keep thumbs up to ceiling, elbows straight and shoulders relaxed/down throughout.  1. Lift arms in front to shoulder height 2. Lift arms a little wider into a V to shoulder height 3. Lift arms out to sides in a T to shoulder height  Perform 10 times in each direction. Hold 1-2 lbs to start with and work up to 2-3 sets of 10/day. Perform 3-4 times/week. Increase weight as able, decreasing sets of 10 each time you increase weights, then slowly working your way back up to 2-3 sets each time.     Wall Push-Up    With feet and hands shoulder-width apart, lean into wall, then push away from wall. Repeat __10__ times or for __1-2__ sets. Do __1__ sessions per day.    Cancer Rehab [phone]

## 2024-04-27 NOTE — Therapy (Signed)
 OUTPATIENT PHYSICAL THERAPY  UPPER EXTREMITY ONCOLOGY TREATMENT  Patient Name: Katie [name] MRN: [id number] DOB:[birth year], 78 y.o., female Today's Date: 04/27/2024  END OF SESSION:  PT End of Session - 04/27/24 1005     Visit Number 12    Number of Visits 15    Date for PT Re-Evaluation 05/18/24    Authorization Type [employer] approved 6 visits 04/06/24- 05/21/24    Authorization Time Period auth 20 visits  4/16-6/20  Authorization #[id number]    Authorization - Visit Number 4    Authorization - Number of Visits 6    Progress Note Due on Visit 19    PT Start Time 1003    PT Stop Time 1058    PT Time Calculation (min) 55 min    Activity Tolerance Patient tolerated treatment well    Behavior During Therapy WFL for tasks assessed/performed           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, small, not treating-watching , it is now stented   Blood transfusion without reported diagnosis    Cancer (HCC)    breast 08/2018   Coronary artery disease    Diverticulitis    DJD (degenerative joint disease)    DM type 2 (diabetes mellitus, type 2) (HCC)    Family history of breast cancer    Fibrocystic breast disease    GERD (gastroesophageal reflux disease)    History of chicken pox    Hypercholesteremia    Hypertension    Lichen planopilaris    Neuromuscular disorder (HCC)    Sleep apnea    cpap   Urinary incontinence    Vertigo 12/2019   1 episode   Past Surgical History:  Procedure Laterality Date   BREAST IMPLANT REMOVAL Bilateral 03/08/2019   Procedure: Removal of Bilateral Breast Implants;  Surgeon: Ebbie Cough, MD;  Location: [facility] OR;  Service: General;  Laterality: Bilateral;   BREAST SURGERY  02/1975   Left Breast Biopsy-Fibrocystic Disease   BREAST SURGERY  10/1981   Bialteral Capsulectomy Breast Surgery   BREAST SURGERY  07/1998   Replace Bilateral Silicone Implants   BREAST SURGERY  01/1976   Bilater  breast surgery to remove fibrocystic tissue and replace with silicone implants   CATARACT EXTRACTION W/PHACO Right 02/08/2020   Procedure: CATARACT EXTRACTION PHACO AND INTRAOCULAR LENS PLACEMENT (IOC) RIGHT DIABETIC;  Surgeon: Jaye Fallow, MD;  Location: [facility];  Service: Ophthalmology;  Laterality: Right;  3.55 0:33.7   CATARACT EXTRACTION W/PHACO Left 02/29/2020   Procedure: CATARACT EXTRACTION PHACO AND INTRAOCULAR LENS PLACEMENT (IOC) LEFT DIABETIC 2.93  00:27.1;  Surgeon: Jaye Fallow, MD;  Location: [facility];  Service: Ophthalmology;  Laterality: Left;  Diabetic - oral meds   CHOLECYSTECTOMY N/A 07/08/2016   Procedure: LAPAROSCOPIC CHOLECYSTECTOMY;  Surgeon: Carlin Pastel, MD;  Location: [facility] ORS;  Service: General;  Laterality: N/A;   COLONOSCOPY WITH PROPOFOL  N/A 11/07/2017   Procedure: COLONOSCOPY WITH PROPOFOL ;  Surgeon: Therisa Bi, MD;  Location: [facility] END[facility]COPY;  Service: Gastroenterology;  Laterality: N/A;   DILATION AND CURETTAGE OF UTERUS  01/1994   ESOPHAGOGASTRODUODENOSCOPY (EGD) WITH PROPOFOL  N/A 12/05/2016   Procedure: ESOPHAGOGASTRODUODENOSCOPY (EGD) WITH PROPOFOL ;  Surgeon: Bi Therisa, MD;  Location: [facility] ENDOSCOPY;  Service: Endoscopy;  Laterality: N/A;   FINGER SURGERY  2001 & 2003   Trigger Finger   FOOT SURGERY  1980   To Relieve Pinched Nerve  FOOT SURGERY  07/2008   Left Foot-Plantar Fasciitis   JOINT REPLACEMENT  04/19/2013   Hip Replacement-Right   MASTECTOMY     double   MASTECTOMY W/ SENTINEL NODE BIOPSY Bilateral 03/08/2019   Procedure: BILATERAL MASTECTOMIES WITH LEFT AXILLARY SENTINEL LYMPH NODE BIOPSY AND BLUE DYE INJECTION;  Surgeon: Ebbie Cough, MD;  Location: [facility] OR;  Service: General;  Laterality: Bilateral;   PARTIAL KNEE ARTHROPLASTY Right 12/31/2022   Procedure: RIGHT PARTIAL KNEE ARTHROPLASTY;  Surgeon: Edie Norleen PARAS, MD;  Location: [facility] ORS;  Service: Orthopedics;  Laterality: Right;   PLACEMENT OF  BREAST IMPLANTS  12/26/2004   Replace Failed Implants   REPLACEMENT TOTAL KNEE Left    2019-   RHINOPLASTY  03/1964   To Correct Deviated Septum   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/2023   Acute diverticulitis 03/21/2023   Abdominal pain, left lower quadrant 03/16/2023   Burning with urination 03/07/2023   Overweight (BMI 25.0-29.9) 03/05/2022   Elevated ferritin 03/23/2021   Decreased GFR 02/23/2021   Vitamin B 12 deficiency 02/23/2021   Leukopenia 02/23/2021   Bronchiectasis without complication (HCC) 01/26/2020   Aortic atherosclerosis (HCC) 12/23/2019   Atherosclerosis of coronary artery of native heart without angina pectoris 12/23/2019   Peripheral edema 12/22/2019   Chemotherapy-induced peripheral neuropathy (HCC) 10/29/2019   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   Anemia 10/15/2018   Diabetic retinopathy (HCC) 09/29/2018   Family history of leukemia 09/16/2018   Family history of breast cancer    Malignant neoplasm of upper-outer quadrant of left breast in female, 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/11/2018   Family history of breast cancer in sister 08/28/2018   Diverticular disease 10/17/2017   Primary osteoarthritis of right knee 03/07/2017   HSV-1 (herpes simplex virus 1) infection, 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   Vitamin D  deficiency 10/11/2016   Family history of thyroid  disorder 04/04/2015   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   Counseling regarding end of life decision making 10/04/2014   OA (osteoarthritis) of neck 08/02/2014   History of duodenal ulcer 08/02/2014   Seasonal allergies 08/02/2014   Hypertension associated with diabetes (HCC) 08/02/2014   Hyperlipidemia  associated with type 2 diabetes mellitus (HCC) 08/02/2014   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   History of joint replacement 03/01/2014   Diabetes mellitus type 2 with retinopathy (HCC) 11/07/2009    PCP: Greig Ring, MD  REFERRING PROVIDER: Kip Lynwood Double, PA-C  REFERRING DIAG: R49.587 L breast cancer/ cording  THERAPY DIAG:  Stiffness of left shoulder, not elsewhere classified  Disorder of the skin and subcutaneous tissue related to radiation, unspecified  Cramp and spasm  Malignant neoplasm of upper-outer quadrant of left breast in female, estrogen receptor positive (HCC)  ONSET DATE: 03/08/2019  Rationale for Evaluation and Treatment: Rehabilitation  SUBJECTIVE:  SUBJECTIVE STATEMENT: I can't believe how much my skin moves now. I know it won't ever by like the Rt side, but I'm happy it's better at all. I did feel a small knot in the pect muscle over the weekend, I think it was a scar tissue.   PERTINENT HISTORY: In 2020 she underwent primary chemotherapy for a triple positive left breast cancer. This was followed by bilateral mastectomies and a sentinel lymph node biopsy  March 08 2019. 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 The right mastectomy was negative. The pathologist lost the nodes so there is no pathology on the nodes. This was followed by adjuvant radiation therapy and she is now on anastrozole  doing well. Due to take that for 7 years. SABRA DJD (degenerative joint disease)  DM II (diabetes mellitus, type II), controlled. R partial knee arthroplasty 12/31/2022, L total knee 2019, 04/19/2013 R hip replacement, has stent for cerebral aneurysm   PAIN:  Are you having pain? No, not currently.   PRECAUTIONS: Other: L UE lymphedema  risk, L total knee 2019, R partial knee 2024, R THA 2014, has stent for cerebral aneurysm  RED FLAGS: None   WEIGHT BEARING RESTRICTIONS: No  FALLS:  Has patient fallen in last 6 months? No  LIVING ENVIRONMENT: Lives with: lives with their spouse Lives in: House/apartment Stairs: Yes; Internal: 12 steps; can reach both and External: 5 steps; can reach both Has following equipment at home: Grab bars  OCCUPATION: retried  LEISURE: belongs to [employer] 2-4 days/week, does Nu[employer], treadmill, recumbent bike, weight machines  HAND DOMINANCE: right   PRIOR LEVEL OF FUNCTION: Independent  PATIENT GOALS: to loosen the muscle to improve mobility, loosen scar tissue   OBJECTIVE: Note: Objective measures were completed at Evaluation unless otherwise noted.  COGNITION: Overall cognitive status: Within functional limits for tasks assessed   PALPATION: Tightness palpable especially at lateral edge of scar, cording at L axilla  OBSERVATIONS / OTHER ASSESSMENTS: telangiectasias visible at L chest  POSTURE: rounded shoulders, forward head  UPPER EXTREMITY AROM/PROM:  A/PROM RIGHT   eval   Shoulder extension 71  Shoulder flexion 162  Shoulder abduction 150  Shoulder internal rotation 67  Shoulder external rotation 76    (Blank rows = not tested)  A/PROM LEFT   eval Left 04/01/24  Shoulder extension 50   Shoulder flexion 147 with p! 152  Shoulder abduction 135 146  Shoulder internal rotation 55   Shoulder external rotation 79     (Blank rows = not tested)    UPPER EXTREMITY STRENGTH: R 5/5, L 4/5  LYMPHEDEMA ASSESSMENTS:   SURGERY TYPE/DATE: bilateral mastectomy and SLNB 03/08/19  NUMBER OF LYMPH NODES REMOVED: 1 - was lost so no pathology  CHEMOTHERAPY: completed  RADIATION:completed  HORMONE TREATMENT: on anastrozole   INFECTIONS: none   LYMPHEDEMA ASSESSMENTS:   LANDMARK RIGHT  eval  At axilla  34.5  15 cm proximal to olecranon process 31.4   10 cm proximal to olecranon process 28  Olecranon process 24.2  15 cm proximal to ulnar styloid process 23  10 cm proximal to ulnar styloid process 20.9  Just proximal to ulnar styloid process 17.5  Across hand at thumb web space 18.5  At base of 2nd digit 6.6  (Blank rows = not tested)  LANDMARK LEFT  eval  At axilla  34.5  15 cm proximal to olecranon process 33  10 cm proximal to olecranon process 30.2  Olecranon process 26  15 cm proximal to ulnar styloid process 23.6  10 cm proximal to ulnar styloid process 21  Just proximal to ulnar styloid process 17  Across hand at thumb web space 18.3  At base of 2nd digit 6.5  (Blank rows = not tested)   QUICK DASH:  03/09/24: 20.45% 04/01/24:  13.64%                                                                                                                                TREATMENT DATE:  04/27/24: Therapeutic Exercise Roll yellow ball up wall with 1# added to Lt wrist today into flex with and then Lt UE abd x 10 each returning therapist demo Therapeutic Activities Bil UE 3 way raises returning therapist for each with back against the wall and shoulders/head against wall as able: 2# for flex, scaption and 1# for abd x 10 each; then same position for bil bicep curls with 2# x 12 Wall Push Ups x 10 with brief VC's for technique Manual Therapy P/ROM to Lt shoulder into flex, abd and D2 with scapular depression throughout by therapist MFR to entire Lt chest wall, pect insertion and most focus inferior to mastectomy incision where skin/tissue still firmest STM to Lt pect insertion and inferior to mastectomy incision and on incision  04/22/24: Therapeutic Exercises UE Ranger into flex x 10, very little stretch felt with abd so stopped Roll yellow ball up wall into flex with and then Lt UE abd x 10 each returning therapist demo Therapeutic Activities Free Motion Machine: Bil scap retract 7# 2 x 10 reps, alt flexion 3# x 10 then alt  UE ext 7# x 12; Lt UE er 3# x 10 Wall Push Ups 2 x 10 returning therapist demo Manual Therapy P/ROM of Lt shoulder into flex, abduction, and D2 with scapular depression by therapist MFR along tight Lt pect tendon STM in supine using cocoa butter to Lt pec to help decrease tightness and gentle suction cupping moving cup in all directions to help loosen fascia over multiple areas of chest wall  04/15/24: Therapeutic Exercise Pulleys into flex and abd x 2 mins each returning therapist demo with VC's to stretch to end range Roll yellow ball up wall into flex with and then Lt UE abd x 10 each returning therapist demo Therapeutic Activities Free Motion Machine: Bil scap retract 7# 2 x 10 reps, alt flexion 3# x 10 then alt UE ext 7# x 12 Standing with back against wall and core engaged for bil UE er 2 x 10 yellow theraband Wall Push Ups x 10 returning therapist demo  Manual Therapy and Self Care (with instruction for cupping) P/ROM of Lt shoulder into flex, abduction, and D2 with scapular depression by therapist throughout MFR along tight Lt pect tendon and at axilla STM first seated in chair in front of mirror to teach pt how to perform this on herself, she wasn't  squeezing the cup before skin contact so once she was able to use correctly, she was able to return good cupping technique; then transitioned to supine using cocoa butter to Lt pec to help decrease tightness and gentle cupping moving cup in all directions to help loosen fascia x 8 mins over pec, lateral trunk, and into axilla as cup allowed on curved surfaces   04/06/24: Therapeutic Exercise Pulleys into flex and abd x 2 mins each returning therapist demo with VC's to stretch to end range Roll yellow ball up wall into flex and then Lt UE abd x 10 each returning therapist demo Therapeutic Activities Free Motion Machine: Bil scap retract 7# x 10 reps, then UE ext 7# on R then on Lx 10 reps with core engaged, flexion x 3# x 10, ER bilaterally  x 3 lbs but pt had increased difficulty on R so had pt do R with no resistance, x 10 on L.  Manual Therapy P/ROM of Lt shoulder into flex, and abduction MFR along tight Lt pect tendon STM in supine using cocoa butter to Lt pec to help decrease tightness and gentle suction cupping moving cup in all directions to help loosen fascia x 8 mins over pec and lateral trunk      PATIENT EDUCATION:  Education details: Supine Scap Series with yellow theraband Person educated: Patient Education method: Explanation, Demonstration, VC's and handout issued Education comprehension: verbalized understanding, returned demonstration, tactile and VC's and pt will benefit from further review  HOME EXERCISE PROGRAM: Supine dowel flexion and abduction x 10 reps each with 5-15 sec holds 03/16/24 - Doorway pectoralis stretch 03/22/24 - supine scap series with yellow theraband  ASSESSMENT:  CLINICAL IMPRESSION: Continued with bil UE and postural strength. Progressed HEP to include wall push ups and bil UE strength/postural strength with 3 way raises. Advised pt that she doesn't need to do all HEP each day, just do some of exs each day. She verbalized good understanding and reports was challenged by these today. Then continued with manual therapy focusing on MFR to Lt chest wall as skin mobility has much improved since start of care. Pt reports she feels firmest from radiation fibrosis and scar tissue inferior to mastectomy incision so focused here as well. Was unable to palpate knot pt felt over weekend due MT. Encouraged her that it is likely scar tissue or radiation fibrosis that may have not been palpable until now as tissue is now more supple. Did not do cupping today as good stretches were noted by therapist and pt with MFR and STM, more so than in past as her skin and tissue is more supple with increased flexibility/elasticity.    OBJECTIVE IMPAIRMENTS: decreased knowledge of condition, decreased ROM, decreased  strength, increased muscle spasms, impaired UE functional use, postural dysfunction, and pain.   ACTIVITY LIMITATIONS: carrying, lifting, and reach over head  PARTICIPATION LIMITATIONS: cleaning, laundry, and community activity  PERSONAL FACTORS: Past/current experiences and Time since onset of injury/illness/exacerbation are also affecting patient's functional outcome.   REHAB POTENTIAL: Good  CLINICAL DECISION MAKING: Stable/uncomplicated  EVALUATION COMPLEXITY: Low  GOALS: Goals reviewed with patient? Yes  SHORT TERM GOALS: Target date: 03/23/24  Pt will be able to verbalize lymphedema risk reduction practices.  Baseline: Goal status: MET  2.  Pt will be independent in initial home exercise program for stretching and strengthening.  Baseline:  Goal status: MET   LONG TERM GOALS: Target date: 04/06/24  Pt will demonstrate 160 degrees of L shoulder flexion  to allow her to reach overhead.  Baseline: 147 degrees with p!; 04/01/24 - 152 degrees Goal status: ONGOING  2.  Pt will demonstrate 160 degrees of L shoulder abduction to allow her to reach out to the side. Baseline: 135 degrees; 04/01/24 - 146 degrees  Goal status: ONGOING  3.  Pt will report a 75% improvement in feelings of tightness with UE overhead motion to allow improved comfort. Baseline: 04/01/24 - 50-60% Goal status: ONGOING  4.  Pt will be able to raise L arm without increased pain to improve comfort. Baseline:  Goal status: MET  5.  Pt will be independent in a home exercise program for continued stretching and strengthening.  Baseline: 04/01/24 - Pt is independent with initial HEP and will benefit from progression of this to include gym exs education Goal status: ONGOING   PLAN:  PT FREQUENCY: 1x/wk  PT DURATION: 6 weeks  PLANNED INTERVENTIONS: 97164- PT Re-evaluation, 97110-Therapeutic exercises, 97530- Therapeutic activity, 97535- Self Care, [id number]- Manual therapy, 97760- Orthotic Initial, [id number]  Orthotic/Prosthetic subsequent, and Manual lymph drainage  PLAN FOR NEXT SESSION: Review bil UE 3 way raises assessing technique; Stop pulleys here as pt is independent with these now at home; Cont ball, foam roll for pec stretch, Cont cupping for fascial restrictions and adhesions prn; MFR to cording, AA/A/P/ROM  Aden Berwyn Caldron, PTA 04/27/2024, 12:03 PM   CHEST: Doorway, Bilateral - Standing    Stand with one foot in front of the other. Standing in doorway, place hands on wall with elbows bent at shoulder height. Lean forward. Hold _20-30__ seconds. _2-3__ reps per set, _4__ sets per day  Cancer Rehab [phone]

## 2024-05-03 DIAGNOSIS — S29012A Strain of muscle and tendon of back wall of thorax, initial encounter: Secondary | ICD-10-CM | POA: Diagnosis not present

## 2024-05-04 ENCOUNTER — Ambulatory Visit

## 2024-05-07 DIAGNOSIS — D1801 Hemangioma of skin and subcutaneous tissue: Secondary | ICD-10-CM | POA: Diagnosis not present

## 2024-05-08 ENCOUNTER — Other Ambulatory Visit: Payer: Self-pay | Admitting: Hematology and Oncology

## 2024-05-10 NOTE — Telephone Encounter (Signed)
 Refilled per last office note.

## 2024-05-11 ENCOUNTER — Ambulatory Visit

## 2024-05-11 DIAGNOSIS — C50412 Malignant neoplasm of upper-outer quadrant of left female breast: Secondary | ICD-10-CM | POA: Diagnosis not present

## 2024-05-11 DIAGNOSIS — L599 Disorder of the skin and subcutaneous tissue related to radiation, unspecified: Secondary | ICD-10-CM | POA: Diagnosis not present

## 2024-05-11 DIAGNOSIS — Z170 Estrogen receptor positive status [ER+]: Secondary | ICD-10-CM | POA: Diagnosis not present

## 2024-05-11 DIAGNOSIS — M25612 Stiffness of left shoulder, not elsewhere classified: Secondary | ICD-10-CM | POA: Diagnosis not present

## 2024-05-11 DIAGNOSIS — R252 Cramp and spasm: Secondary | ICD-10-CM

## 2024-05-11 NOTE — Therapy (Signed)
 OUTPATIENT PHYSICAL THERAPY  UPPER EXTREMITY ONCOLOGY TREATMENT  Patient Name: Katie [name] MRN: [id number] DOB:08/01/1946, 78 y.o., female Today's Date: 05/11/2024  END OF SESSION:  PT End of Session - 05/11/24 1004     Visit Number 13    Number of Visits 15    Date for PT Re-Evaluation 05/18/24    Authorization Type [employer] approved 6 visits 04/06/24- 05/21/24    Authorization - Visit Number 5    Authorization - Number of Visits 6    Progress Note Due on Visit 19    PT Start Time 0959    PT Stop Time 1055    PT Time Calculation (min) 56 min    Activity Tolerance Patient tolerated treatment well    Behavior During Therapy WFL for tasks assessed/performed           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, small, not treating-watching , it is now stented   Blood transfusion without reported diagnosis    Cancer (HCC)    breast 08/2018   Coronary artery disease    Diverticulitis    DJD (degenerative joint disease)    DM type 2 (diabetes mellitus, type 2) (HCC)    Family history of breast cancer    Fibrocystic breast disease    GERD (gastroesophageal reflux disease)    History of chicken pox    Hypercholesteremia    Hypertension    Lichen planopilaris    Neuromuscular disorder (HCC)    Sleep apnea    cpap   Urinary incontinence    Vertigo 12/2019   1 episode   Past Surgical History:  Procedure Laterality Date   BREAST IMPLANT REMOVAL Bilateral 03/08/2019   Procedure: Removal of Bilateral Breast Implants;  Surgeon: Ebbie Cough, MD;  Location: [facility] OR;  Service: General;  Laterality: Bilateral;   BREAST SURGERY  02/1975   Left Breast Biopsy-Fibrocystic Disease   BREAST SURGERY  10/1981   Bialteral Capsulectomy Breast Surgery   BREAST SURGERY  07/1998   Replace Bilateral Silicone Implants   BREAST SURGERY  01/1976   Bilater breast surgery to remove fibrocystic tissue and replace with silicone implants    CATARACT EXTRACTION W/PHACO Right 02/08/2020   Procedure: CATARACT EXTRACTION PHACO AND INTRAOCULAR LENS PLACEMENT (IOC) RIGHT DIABETIC;  Surgeon: Jaye Fallow, MD;  Location: [facility];  Service: Ophthalmology;  Laterality: Right;  3.55 0:33.7   CATARACT EXTRACTION W/PHACO Left 02/29/2020   Procedure: CATARACT EXTRACTION PHACO AND INTRAOCULAR LENS PLACEMENT (IOC) LEFT DIABETIC 2.93  00:27.1;  Surgeon: Jaye Fallow, MD;  Location: [facility];  Service: Ophthalmology;  Laterality: Left;  Diabetic - oral meds   CHOLECYSTECTOMY N/A 07/08/2016   Procedure: LAPAROSCOPIC CHOLECYSTECTOMY;  Surgeon: Carlin Pastel, MD;  Location: [facility] ORS;  Service: General;  Laterality: N/A;   COLONOSCOPY WITH PROPOFOL  N/A 11/07/2017   Procedure: COLONOSCOPY WITH PROPOFOL ;  Surgeon: Therisa Bi, MD;  Location: [facility];  Service: Gastroenterology;  Laterality: N/A;   DILATION AND CURETTAGE OF UTERUS  01/1994   ESOPHAGOGASTRODUODENOSCOPY (EGD) WITH PROPOFOL  N/A 12/05/2016   Procedure: ESOPHAGOGASTRODUODENOSCOPY (EGD) WITH PROPOFOL ;  Surgeon: Bi Therisa, MD;  Location: [facility] END[facility]COPY;  Service: Endoscopy;  Laterality: N/A;   FINGER SURGERY  2001 & 2003   Trigger Finger   FOOT SURGERY  1980   To Relieve Pinched Nerve   FOOT SURGERY  07/2008   Left Foot-Plantar Fasciitis   JOINT REPLACEMENT  04/19/2013   Hip Replacement-Right   MASTECTOMY     double   MASTECTOMY W/ SENTINEL NODE BIOPSY Bilateral 03/08/2019   Procedure: BILATERAL MASTECTOMIES WITH LEFT AXILLARY SENTINEL LYMPH NODE BIOPSY AND BLUE DYE INJECTION;  Surgeon: Ebbie Cough, MD;  Location: [facility] OR;  Service: General;  Laterality: Bilateral;   PARTIAL KNEE ARTHROPLASTY Right 12/31/2022   Procedure: RIGHT PARTIAL KNEE ARTHROPLASTY;  Surgeon: Edie Norleen PARAS, MD;  Location: [facility] ORS;  Service: Orthopedics;  Laterality: Right;   PLACEMENT OF BREAST IMPLANTS  12/26/2004   Replace Failed Implants   REPLACEMENT TOTAL KNEE Left     2019-   RHINOPLASTY  03/1964   To Correct Deviated Septum   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/2023   Acute diverticulitis 03/21/2023   Abdominal pain, left lower quadrant 03/16/2023   Burning with urination 03/07/2023   Overweight (BMI 25.0-29.9) 03/05/2022   Elevated ferritin 03/23/2021   Decreased GFR 02/23/2021   Vitamin B 12 deficiency 02/23/2021   Leukopenia 02/23/2021   Bronchiectasis without complication (HCC) 01/26/2020   Aortic atherosclerosis (HCC) 12/23/2019   Atherosclerosis of coronary artery of native heart without angina pectoris 12/23/2019   Peripheral edema 12/22/2019   Chemotherapy-induced peripheral neuropathy (HCC) 10/29/2019   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   Anemia 10/15/2018   Diabetic retinopathy (HCC) 09/29/2018   Family history of leukemia 09/16/2018   Family history of breast cancer    Malignant neoplasm of upper-outer quadrant of left breast in female, 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/11/2018   Family history of breast cancer in sister 08/28/2018   Diverticular disease 10/17/2017   Primary osteoarthritis of right knee 03/07/2017   HSV-1 (herpes simplex virus 1) infection, 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   Vitamin D  deficiency 10/11/2016   Family history of thyroid  disorder 04/04/2015   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   Counseling regarding end of life decision making 10/04/2014   OA (osteoarthritis) of neck 08/02/2014   History of duodenal ulcer 08/02/2014   Seasonal allergies 08/02/2014   Hypertension associated with diabetes (HCC) 08/02/2014   Hyperlipidemia associated with type 2 diabetes mellitus (HCC) 08/02/2014   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    History of joint replacement 03/01/2014   Diabetes mellitus type 2 with retinopathy (HCC) 11/07/2009    PCP: Greig Ring, MD  REFERRING PROVIDER: Kip Lynwood Double, PA-C  REFERRING DIAG: R49.587 L breast cancer/ cording  THERAPY DIAG:  Stiffness of left shoulder, not elsewhere classified  Disorder of the skin and subcutaneous tissue related to radiation, unspecified  Cramp and spasm  Malignant neoplasm of upper-outer quadrant of left breast in female, estrogen receptor positive (HCC)  ONSET DATE: 03/08/2019  Rationale for Evaluation and Treatment: Rehabilitation  SUBJECTIVE:  SUBJECTIVE STATEMENT: We were traveling and I went to pick up my suitcase and pulled a muscle in my Lt scapula area. I saw the doctor and he put me on hold from PT for a week and I took prednisone  for 5 days. It feels much better now and no pain.   PERTINENT HISTORY: In 2020 she underwent primary chemotherapy for a triple positive left breast cancer. This was followed by bilateral mastectomies and a sentinel lymph node biopsy  March 08 2019. 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 The right mastectomy was negative. The pathologist lost the nodes so there is no pathology on the nodes. This was followed by adjuvant radiation therapy and she is now on anastrozole  doing well. Due to take that for 7 years. SABRA DJD (degenerative joint disease)  DM II (diabetes mellitus, type II), controlled. R partial knee arthroplasty 12/31/2022, L total knee 2019, 04/19/2013 R hip replacement, has stent for cerebral aneurysm   PAIN:  Are you having pain? No, not currently.   PRECAUTIONS: Other: L UE lymphedema risk, L total knee 2019, R partial knee 2024, R THA 2014, has stent for cerebral  aneurysm  RED FLAGS: None   WEIGHT BEARING RESTRICTIONS: No  FALLS:  Has patient fallen in last 6 months? No  LIVING ENVIRONMENT: Lives with: lives with their spouse Lives in: House/apartment Stairs: Yes; Internal: 12 steps; can reach both and External: 5 steps; can reach both Has following equipment at home: Grab bars  OCCUPATION: retried  LEISURE: belongs to [employer] 2-4 days/week, does Nu[employer], treadmill, recumbent bike, weight machines  HAND DOMINANCE: right   PRIOR LEVEL OF FUNCTION: Independent  PATIENT GOALS: to loosen the muscle to improve mobility, loosen scar tissue   OBJECTIVE: Note: Objective measures were completed at Evaluation unless otherwise noted.  COGNITION: Overall cognitive status: Within functional limits for tasks assessed   PALPATION: Tightness palpable especially at lateral edge of scar, cording at L axilla  OBSERVATIONS / OTHER ASSESSMENTS: telangiectasias visible at L chest  POSTURE: rounded shoulders, forward head  UPPER EXTREMITY AROM/PROM:  A/PROM RIGHT   eval   Shoulder extension 71  Shoulder flexion 162  Shoulder abduction 150  Shoulder internal rotation 67  Shoulder external rotation 76    (Blank rows = not tested)  A/PROM LEFT   eval Left 04/01/24 Left 05/11/24  Shoulder extension 50  55  Shoulder flexion 147 with p! 152 155 no p!  Shoulder abduction 135 146 147  Shoulder internal rotation 55  65  Shoulder external rotation 79      (Blank rows = not tested)    UPPER EXTREMITY STRENGTH: R 5/5, L 4/5  LYMPHEDEMA ASSESSMENTS:   SURGERY TYPE/DATE: bilateral mastectomy and SLNB 03/08/19  NUMBER OF LYMPH NODES REMOVED: 1 - was lost so no pathology  CHEMOTHERAPY: completed  RADIATION:completed  HORMONE TREATMENT: on anastrozole   INFECTIONS: none   LYMPHEDEMA ASSESSMENTS:   LANDMARK RIGHT  eval  At axilla  34.5  15 cm proximal to olecranon process 31.4  10 cm proximal to olecranon process 28   Olecranon process 24.2  15 cm proximal to ulnar styloid process 23  10 cm proximal to ulnar styloid process 20.9  Just proximal to ulnar styloid process 17.5  Across hand at thumb web space 18.5  At base of 2nd digit 6.6  (Blank rows = not tested)  LANDMARK LEFT  eval  At axilla  34.5  15 cm  proximal to olecranon process 33  10 cm proximal to olecranon process 30.2  Olecranon process 26  15 cm proximal to ulnar styloid process 23.6  10 cm proximal to ulnar styloid process 21  Just proximal to ulnar styloid process 17  Across hand at thumb web space 18.3  At base of 2nd digit 6.5  (Blank rows = not tested)   QUICK DASH:  03/09/24: 20.45% 04/01/24:  13.64%                                                                                                                                TREATMENT DATE:  05/11/24: Therapeutic Exercise Pulleys into flex x 1 min and abd x 2 mins reviewing proper technique as pt hasn't been able to do this x 1 week due to injury Roll yellow ball up wall into flex and Lt abd x 10 each, no weight as this is pts first time being able to stretch x 1 week per MD Therapeutic Activities Supine over half foam roll for following: Bil UE horz abd, bil UE scaption into a V, bil UE abd in a snow angel, and then alt flex x 10 each exs and reviewed proper technique of each encouraging slow motions Manual Therapy Started with STM in Rt S/L to Lt medial scapular border where pt had hurt herself and pulled a muscle when she fell, tolerated this well P/ROM I supine to Lt shoulder into flex, abd and D2 with scapular depression throughout by therapist MFR to entire Lt chest wall, pect insertion and most focus inferior to mastectomy incision where skin/tissue still firmest STM to Lt pect insertion and inferior to mastectomy incision and on incision   04/27/24: Therapeutic Exercise Roll yellow ball up wall with 1# added to Lt wrist today into flex with and then Lt UE  abd x 10 each returning therapist demo Therapeutic Activities Bil UE 3 way raises returning therapist for each with back against the wall and shoulders/head against wall as able: 2# for flex, scaption and 1# for abd x 10 each; then same position for bil bicep curls with 2# x 12 Wall Push Ups x 10 with brief VC's for technique Manual Therapy P/ROM to Lt shoulder into flex, abd and D2 with scapular depression throughout by therapist MFR to entire Lt chest wall, pect insertion and most focus inferior to mastectomy incision where skin/tissue still firmest STM to Lt pect insertion and inferior to mastectomy incision and on incision  04/22/24: Therapeutic Exercises UE Ranger into flex x 10, very little stretch felt with abd so stopped Roll yellow ball up wall into flex with and then Lt UE abd x 10 each returning therapist demo Therapeutic Activities Free Motion Machine: Bil scap retract 7# 2 x 10 reps, alt flexion 3# x 10 then alt UE ext 7# x 12; Lt UE er 3# x 10 Wall Push Ups 2 x 10 returning therapist demo Manual Therapy P/ROM  of Lt shoulder into flex, abduction, and D2 with scapular depression by therapist MFR along tight Lt pect tendon STM in supine using cocoa butter to Lt pec to help decrease tightness and gentle suction cupping moving cup in all directions to help loosen fascia over multiple areas of chest wall       PATIENT EDUCATION:  Education details: Supine Scap Series with yellow theraband Person educated: Patient Education method: Explanation, Demonstration, VC's and handout issued Education comprehension: verbalized understanding, returned demonstration, tactile and VC's and pt will benefit from further review  HOME EXERCISE PROGRAM: Supine dowel flexion and abduction x 10 reps each with 5-15 sec holds 03/16/24 - Doorway pectoralis stretch 03/22/24 - supine scap series with yellow theraband  ASSESSMENT:  CLINICAL IMPRESSION: Pt returns after 1 week off due to pulled Lt  scapula muscle. Pt reports MD put her on hold from all stretching x 1 week and she was on prednisone  x 1 week. She felt much improved today with no pain and only good stretches felt with all activities. Re measured her A/ROM and she is slight improved with all measurements from last time measured. Pt may be ready for D/C per POC at next session but will reassess how she is feeling getting back into her normal activities this week.    OBJECTIVE IMPAIRMENTS: decreased knowledge of condition, decreased ROM, decreased strength, increased muscle spasms, impaired UE functional use, postural dysfunction, and pain.   ACTIVITY LIMITATIONS: carrying, lifting, and reach over head  PARTICIPATION LIMITATIONS: cleaning, laundry, and community activity  PERSONAL FACTORS: Past/current experiences and Time since onset of injury/illness/exacerbation are also affecting patient's functional outcome.   REHAB POTENTIAL: Good  CLINICAL DECISION MAKING: Stable/uncomplicated  EVALUATION COMPLEXITY: Low  GOALS: Goals reviewed with patient? Yes  SHORT TERM GOALS: Target date: 03/23/24  Pt will be able to verbalize lymphedema risk reduction practices.  Baseline: Goal status: MET  2.  Pt will be independent in initial home exercise program for stretching and strengthening.  Baseline:  Goal status: MET   LONG TERM GOALS: Target date: 04/06/24  Pt will demonstrate 160 degrees of L shoulder flexion to allow her to reach overhead.  Baseline: 147 degrees with p!; 04/01/24 - 152 degrees; 05/11/24 - 155 degrees Goal status: ONGOING  2.  Pt will demonstrate 160 degrees of L shoulder abduction to allow her to reach out to the side. Baseline: 135 degrees; 04/01/24 - 146 degrees; 05/11/24 - 147 degrees  Goal status: ONGOING  3.  Pt will report a 75% improvement in feelings of tightness with UE overhead motion to allow improved comfort. Baseline: 04/01/24 - 50-60% Goal status: ONGOING  4.  Pt will be able to raise L arm  without increased pain to improve comfort. Baseline:  Goal status: MET  5.  Pt will be independent in a home exercise program for continued stretching and strengthening.  Baseline: 04/01/24 - Pt is independent with initial HEP and will benefit from progression of this to include gym exs education Goal status: ONGOING   PLAN:  PT FREQUENCY: 1x/wk  PT DURATION: 6 weeks  PLANNED INTERVENTIONS: 97164- PT Re-evaluation, 97110-Therapeutic exercises, 97530- Therapeutic activity, 97535- Self Care, [id number]- Manual therapy, 97760- Orthotic Initial, [id number]- Orthotic/Prosthetic subsequent, and Manual lymph drainage  PLAN FOR NEXT SESSION: Ready for D/C? Get to order foam roll? Review bil UE 3 way raises assessing technique; Cont ball, foam roll for pec stretch, Cont cupping for fascial restrictions and adhesions prn; MFR to cording, AA/A/P/ROM  Aden Berwyn Caldron,  PTA 05/11/2024, 12:27 PM   CHEST: Doorway, Bilateral - Standing    Stand with one foot in front of the other. Standing in doorway, place hands on wall with elbows bent at shoulder height. Lean forward. Hold _20-30__ seconds. _2-3__ reps per set, _4__ sets per day  Cancer Rehab [phone]

## 2024-05-17 ENCOUNTER — Other Ambulatory Visit: Payer: Self-pay | Admitting: Hematology and Oncology

## 2024-05-17 NOTE — Telephone Encounter (Signed)
 Refilled Alendronate  per last office note.  Cosign requested

## 2024-05-18 ENCOUNTER — Ambulatory Visit

## 2024-05-18 ENCOUNTER — Other Ambulatory Visit: Payer: Self-pay | Admitting: Family Medicine

## 2024-05-18 DIAGNOSIS — M25612 Stiffness of left shoulder, not elsewhere classified: Secondary | ICD-10-CM | POA: Diagnosis not present

## 2024-05-18 DIAGNOSIS — L599 Disorder of the skin and subcutaneous tissue related to radiation, unspecified: Secondary | ICD-10-CM

## 2024-05-18 DIAGNOSIS — C50412 Malignant neoplasm of upper-outer quadrant of left female breast: Secondary | ICD-10-CM | POA: Diagnosis not present

## 2024-05-18 DIAGNOSIS — R252 Cramp and spasm: Secondary | ICD-10-CM

## 2024-05-18 DIAGNOSIS — Z170 Estrogen receptor positive status [ER+]: Secondary | ICD-10-CM | POA: Diagnosis not present

## 2024-05-18 NOTE — Therapy (Signed)
 OUTPATIENT PHYSICAL THERAPY  UPPER EXTREMITY ONCOLOGY TREATMENT  Patient Name: Katie [name] MRN: [id number] DOB:1946-05-01, 78 y.o., female Today's Date: 05/18/2024  END OF SESSION:  PT End of Session - 05/18/24 1004     Visit Number 14    Number of Visits 15    Date for Recertification  05/18/24    Authorization Type [employer] approved 6 visits 04/06/24- 05/21/24    Authorization Time Period auth 20 visits  4/16-6/20  Authorization #[id number]    Authorization - Visit Number 6    Authorization - Number of Visits 6    Progress Note Due on Visit 19    PT Start Time 1001    PT Stop Time 1055    PT Time Calculation (min) 54 min    Activity Tolerance Patient tolerated treatment well    Behavior During Therapy WFL for tasks assessed/performed           Past Medical History:  Diagnosis Date   Allergy    Anemia    Aneurysm    in brain, small, not treating-watching , it is now stented   Blood transfusion without reported diagnosis    Cancer (HCC)    breast 08/2018   Coronary artery disease    Diverticulitis    DJD (degenerative joint disease)    DM type 2 (diabetes mellitus, type 2) (HCC)    Family history of breast cancer    Fibrocystic breast disease    GERD (gastroesophageal reflux disease)    History of chicken pox    Hypercholesteremia    Hypertension    Lichen planopilaris    Neuromuscular disorder (HCC)    Sleep apnea    cpap   Urinary incontinence    Vertigo 12/2019   1 episode   Past Surgical History:  Procedure Laterality Date   BREAST IMPLANT REMOVAL Bilateral 03/08/2019   Procedure: Removal of Bilateral Breast Implants;  Surgeon: Ebbie Cough, MD;  Location: [facility] OR;  Service: General;  Laterality: Bilateral;   BREAST SURGERY  02/1975   Left Breast Biopsy-Fibrocystic Disease   BREAST SURGERY  10/1981   Bialteral Capsulectomy Breast Surgery   BREAST SURGERY  07/1998   Replace Bilateral Silicone Implants   BREAST SURGERY  01/1976   Bilater breast  surgery to remove fibrocystic tissue and replace with silicone implants   CATARACT EXTRACTION W/PHACO Right 02/08/2020   Procedure: CATARACT EXTRACTION PHACO AND INTRAOCULAR LENS PLACEMENT (IOC) RIGHT DIABETIC;  Surgeon: Jaye Fallow, MD;  Location: [facility];  Service: Ophthalmology;  Laterality: Right;  3.55 0:33.7   CATARACT EXTRACTION W/PHACO Left 02/29/2020   Procedure: CATARACT EXTRACTION PHACO AND INTRAOCULAR LENS PLACEMENT (IOC) LEFT DIABETIC 2.93  00:27.1;  Surgeon: Jaye Fallow, MD;  Location: [facility];  Service: Ophthalmology;  Laterality: Left;  Diabetic - oral meds   CHOLECYSTECTOMY N/A 07/08/2016   Procedure: LAPAROSCOPIC CHOLECYSTECTOMY;  Surgeon: Carlin Pastel, MD;  Location: [facility] ORS;  Service: General;  Laterality: N/A;   COLONOSCOPY WITH PROPOFOL  N/A 11/07/2017   Procedure: COLONOSCOPY WITH PROPOFOL ;  Surgeon: Therisa Bi, MD;  Location[facility] END[facility];  Service: Gastroenterology;  Laterality: N/A;   DILATION AND CURETTAGE OF UTERUS  01/1994   ESOPHAGOGASTRODUODENOSCOPY (EGD) WITH PROPOFOL  N/A 12/05/2016   Procedure: ESOPHAGOGASTRODUODENOSCOPY (EGD) WITH PROPOFOL ;  Surgeon: Bi Therisa, MD;  Location: [facility] ENDOSCOPY;  Service: Endoscopy;  Laterality: N/A;   FINGER SURGERY  2001 & 2003   Trigger Finger   FOOT SURGERY  1980   To Relieve Pinched Nerve  FOOT SURGERY  07/2008   Left Foot-Plantar Fasciitis   JOINT REPLACEMENT  04/19/2013   Hip Replacement-Right   MASTECTOMY     double   MASTECTOMY W/ SENTINEL NODE BIOPSY Bilateral 03/08/2019   Procedure: BILATERAL MASTECTOMIES WITH LEFT AXILLARY SENTINEL LYMPH NODE BIOPSY AND BLUE DYE INJECTION;  Surgeon: Ebbie Cough, MD;  Location: [facility] OR;  Service: General;  Laterality: Bilateral;   PARTIAL KNEE ARTHROPLASTY Right 12/31/2022   Procedure: RIGHT PARTIAL KNEE ARTHROPLASTY;  Surgeon: Edie Norleen PARAS, MD;  Location: [facility] ORS;  Service: Orthopedics;  Laterality: Right;   PLACEMENT OF BREAST  IMPLANTS  12/26/2004   Replace Failed Implants   REPLACEMENT TOTAL KNEE Left    2019-   RHINOPLASTY  03/1964   To Correct Deviated Septum   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/2023   Acute diverticulitis 03/21/2023   Abdominal pain, left lower quadrant 03/16/2023   Burning with urination 03/07/2023   Overweight (BMI 25.0-29.9) 03/05/2022   Elevated ferritin 03/23/2021   Decreased GFR 02/23/2021   Vitamin B 12 deficiency 02/23/2021   Leukopenia 02/23/2021   Bronchiectasis without complication (HCC) 01/26/2020   Aortic atherosclerosis 12/23/2019   Atherosclerosis of coronary artery of native heart without angina pectoris 12/23/2019   Peripheral edema 12/22/2019   Chemotherapy-induced peripheral neuropathy 10/29/2019   Carotid artery aneurysm 01/05/2019   Anemia 10/15/2018   Diabetic retinopathy (HCC) 09/29/2018   Family history of leukemia 09/16/2018   Family history of breast cancer    Malignant neoplasm of upper-outer quadrant of left breast in female, 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/11/2018   Family history of breast cancer in sister 08/28/2018   Diverticular disease 10/17/2017   Primary osteoarthritis of right knee 03/07/2017   HSV-1 (herpes simplex virus 1) infection, 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   Vitamin D  deficiency 10/11/2016   Family history of thyroid  disorder 04/04/2015   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   Counseling regarding end of life decision making 10/04/2014   OA (osteoarthritis) of neck 08/02/2014   History of duodenal ulcer 08/02/2014   Seasonal allergies 08/02/2014   Hypertension associated with diabetes (HCC) 08/02/2014   Hyperlipidemia associated with type 2  diabetes mellitus (HCC) 08/02/2014   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   History of joint replacement 03/01/2014   Diabetes mellitus type 2 with retinopathy (HCC) 11/07/2009    PCP: Greig Ring, MD  REFERRING PROVIDER: Kip Lynwood Double, PA-C  REFERRING DIAG: R49.587 L breast cancer/ cording  THERAPY DIAG:  Stiffness of left shoulder, not elsewhere classified  Disorder of the skin and subcutaneous tissue related to radiation, unspecified  Cramp and spasm  Malignant neoplasm of upper-outer quadrant of left breast in female, estrogen receptor positive (HCC)  ONSET DATE: 03/08/2019  Rationale for Evaluation and Treatment: Rehabilitation  SUBJECTIVE:  SUBJECTIVE STATEMENT: I felt like I had a little bit of a set back last week with the tightness and pain in my scapula after my fall a few weeks ago. But overall I'm still better than I was. I really am surprised at how much more motion and skin mobility that we got back. Also, the sharp pains I used to have in my pect tendon are gone and I never thought that would happen. My skin doesn't feel stuck to my ribs anymore either.   PERTINENT HISTORY: In 2020 she underwent primary chemotherapy for a triple positive left breast cancer. This was followed by bilateral mastectomies and a sentinel lymph node biopsy  March 08 2019. 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 The right mastectomy was negative. The pathologist lost the nodes so there is no pathology on the nodes. This was followed by adjuvant radiation therapy and she is now on anastrozole  doing well. Due to take that for 7 years. SABRA DJD (degenerative joint disease)  DM II (diabetes mellitus, type II), controlled. R partial knee arthroplasty 12/31/2022, L  total knee 2019, 04/19/2013 R hip replacement, has stent for cerebral aneurysm   PAIN:  Are you having pain? No, not currently.   PRECAUTIONS: Other: L UE lymphedema risk, L total knee 2019, R partial knee 2024, R THA 2014, has stent for cerebral aneurysm  RED FLAGS: None   WEIGHT BEARING RESTRICTIONS: No  FALLS:  Has patient fallen in last 6 months? No  LIVING ENVIRONMENT: Lives with: lives with their spouse Lives in: House/apartment Stairs: Yes; Internal: 12 steps; can reach both and External: 5 steps; can reach both Has following equipment at home: Grab bars  OCCUPATION: retried  LEISURE: belongs to [employer] 2-4 days/week, does Nu[employer], treadmill, recumbent bike, weight machines  HAND DOMINANCE: right   PRIOR LEVEL OF FUNCTION: Independent  PATIENT GOALS: to loosen the muscle to improve mobility, loosen scar tissue   OBJECTIVE: Note: Objective measures were completed at Evaluation unless otherwise noted.  COGNITION: Overall cognitive status: Within functional limits for tasks assessed   PALPATION: Tightness palpable especially at lateral edge of scar, cording at L axilla  OBSERVATIONS / OTHER ASSESSMENTS: telangiectasias visible at L chest  POSTURE: rounded shoulders, forward head  UPPER EXTREMITY AROM/PROM:  A/PROM RIGHT   eval   Shoulder extension 71  Shoulder flexion 162  Shoulder abduction 150  Shoulder internal rotation 67  Shoulder external rotation 76    (Blank rows = not tested)  A/PROM LEFT   eval Left 04/01/24 Left 05/11/24 Left 05/18/24  Shoulder extension 50  55 57  Shoulder flexion 147 with p! 152 155 no p! 160  Shoulder abduction 135 146 147 162  Shoulder internal rotation 55  65   Shoulder external rotation 79       (Blank rows = not tested)    UPPER EXTREMITY STRENGTH: R 5/5, L 4/5  LYMPHEDEMA ASSESSMENTS:   SURGERY TYPE/DATE: bilateral mastectomy and SLNB 03/08/19  NUMBER OF LYMPH NODES REMOVED: 1 - was lost so no  pathology  CHEMOTHERAPY: completed  RADIATION:completed  HORMONE TREATMENT: on anastrozole   INFECTIONS: none   LYMPHEDEMA ASSESSMENTS:   LANDMARK RIGHT  eval  At axilla  34.5  15 cm proximal to olecranon process 31.4  10 cm proximal to olecranon process 28  Olecranon process 24.2  15 cm proximal to ulnar styloid process 23  10 cm proximal to ulnar styloid process 20.9  Just proximal to ulnar styloid process 17.5  Across hand at thumb web space 18.5  At base of 2nd digit 6.6  (Blank rows = not tested)  LANDMARK LEFT  eval  At axilla  34.5  15 cm proximal to olecranon process 33  10 cm proximal to olecranon process 30.2  Olecranon process 26  15 cm proximal to ulnar styloid process 23.6  10 cm proximal to ulnar styloid process 21  Just proximal to ulnar styloid process 17  Across hand at thumb web space 18.3  At base of 2nd digit 6.5  (Blank rows = not tested)   QUICK DASH:  03/09/24: 20.45% 04/01/24:  13.64%                                                                                                                                TREATMENT DATE:  05/18/24: Therapeutic Exercise Pulleys into flex and abd x 1:30 mins each reviewing correct pace as pt tends to do this too fast Roll green ball up wall into flex x 10 (yellow wasn't available) with 1# on Lt wrist Therapeutic Activities Supine over half foam roll for following with 1# on wrists: Bil UE horz abd, bil UE scaption into a V, bil UE abd in a snow angel, and then alt flex x 10 each exs with reminder to keep UE's on mat for snow angel for increased pect stretch; bil UE er x 10 Modified downward dog on wall x 10 reps, 5 sec holds Wall Push Ups x 10 reviewing proper UE position Doorway Pectoralis Stretch with Lt LE posterior for increased trunk stretch along with pect x 5 reps, 20 sec holds reviewing proper technique Manual Therapy P/ROM in supine to Lt shoulder into flex, abd and D2 with scapular  depression throughout by therapist MFR to entire Lt chest wall, pect insertion and most focus inferior to mastectomy incision where skin/tissue still firmest but more supple now than at onset of sessions STM to Lt pect insertion and inferior to mastectomy incision and on incision; dynamic cupping to Lt chest wall at areas of tightness  05/11/24: Therapeutic Exercise Pulleys into flex x 1 min and abd x 2 mins reviewing proper technique as pt hasn't been able to do this x 1 week due to injury Roll yellow ball up wall into flex and Lt abd x 10 each, no weight as this is pts first time being able to stretch x 1 week per MD Therapeutic Activities Supine over half foam roll for following: Bil UE horz abd, bil UE scaption into a V, bil UE abd in a snow angel, and then alt flex x 10 each exs and reviewed proper technique of each encouraging slow motions Manual Therapy Started with STM in Rt S/L to Lt medial scapular border where pt had hurt herself and pulled a muscle when she fell, tolerated this well P/ROM I supine to Lt shoulder into flex, abd and D2  with scapular depression throughout by therapist MFR to entire Lt chest wall, pect insertion and most focus inferior to mastectomy incision where skin/tissue still firmest STM to Lt pect insertion and inferior to mastectomy incision and on incision   04/27/24: Therapeutic Exercise Roll yellow ball up wall with 1# added to Lt wrist today into flex with and then Lt UE abd x 10 each returning therapist demo Therapeutic Activities Bil UE 3 way raises returning therapist for each with back against the wall and shoulders/head against wall as able: 2# for flex, scaption and 1# for abd x 10 each; then same position for bil bicep curls with 2# x 12 Wall Push Ups x 10 with brief VC's for technique Manual Therapy P/ROM to Lt shoulder into flex, abd and D2 with scapular depression throughout by therapist MFR to entire Lt chest wall, pect insertion and most  focus inferior to mastectomy incision where skin/tissue still firmest STM to Lt pect insertion and inferior to mastectomy incision and on incision       PATIENT EDUCATION:  Education details: Supine Scap Series with yellow theraband Person educated: Patient Education method: Explanation, Demonstration, VC's and handout issued Education comprehension: verbalized understanding, returned demonstration, tactile and VC's and pt will benefit from further review  HOME EXERCISE PROGRAM: Supine dowel flexion and abduction x 10 reps each with 5-15 sec holds 03/16/24 - Doorway pectoralis stretch 03/22/24 - supine scap series with yellow theraband  ASSESSMENT:  CLINICAL IMPRESSION: Pt reports feeling much improved since start of care. She is pleasantly surprised at how much progress we were able to achieve as her chest wall has been so tight due to the nature of her mastectomy and following radiation with subsequent fibrosis. Her skin and tissue mobility, though still limited due to extent of damage from treatments, is much improved and more supple. She has met all goals including A/ROM of Lt shoulder progression. Pt is independent with self cupping and MFR and reports this has been instrumental in her progress. Overall pt is very pleased with her progress and ready for D/C at this time. She also knows that if she ever feels like she could benefit from resuming physical therapy, due to the chronic nature of her tightness, that we would be more than happy to treat her again with a new referral from her MD. Pt verbalizes understanding.    OBJECTIVE IMPAIRMENTS: decreased knowledge of condition, decreased ROM, decreased strength, increased muscle spasms, impaired UE functional use, postural dysfunction, and pain.   ACTIVITY LIMITATIONS: carrying, lifting, and reach over head  PARTICIPATION LIMITATIONS: cleaning, laundry, and community activity  PERSONAL FACTORS: Past/current experiences and Time since  onset of injury/illness/exacerbation are also affecting patient's functional outcome.   REHAB POTENTIAL: Good  CLINICAL DECISION MAKING: Stable/uncomplicated  EVALUATION COMPLEXITY: Low  GOALS: Goals reviewed with patient? Yes  SHORT TERM GOALS: Target date: 03/23/24  Pt will be able to verbalize lymphedema risk reduction practices.  Baseline: Goal status: MET  2.  Pt will be independent in initial home exercise program for stretching and strengthening.  Baseline:  Goal status: MET   LONG TERM GOALS: Target date: 04/06/24  Pt will demonstrate 160 degrees of L shoulder flexion to allow her to reach overhead.  Baseline: 147 degrees with p!; 04/01/24 - 152 degrees; 05/11/24 - 155 degrees; 05/18/24 - 160 degrees Goal status: MET  2.  Pt will demonstrate 160 degrees of L shoulder abduction to allow her to reach out to the side. Baseline: 135 degrees; 04/01/24 -  146 degrees; 05/11/24 - 147 degrees ; 05/18/24 - 162 degrees Goal status: MET  3.  Pt will report a 75% improvement in feelings of tightness with UE overhead motion to allow improved comfort. Baseline: 04/01/24 - 50-60%; 05/18/24 - 90%  Goal status: MET  4.  Pt will be able to raise L arm without increased pain to improve comfort. Baseline:  Goal status: MET  5.  Pt will be independent in a home exercise program for continued stretching and strengthening.  Baseline: 04/01/24 - Pt is independent with initial HEP and will benefit from progression of this to include gym exs education Goal status: MET   PLAN:  PT FREQUENCY: 1x/wk  PT DURATION: 6 weeks  PLANNED INTERVENTIONS: 97164- PT Re-evaluation, 97110-Therapeutic exercises, 97530- Therapeutic activity, 97535- Self Care, 02859- Manual therapy, 97760- Orthotic Initial, [id number]- Orthotic/Prosthetic subsequent, and Manual lymph drainage  PLAN FOR NEXT SESSION: D/C this visit.  Aden Berwyn Caldron, PTA 05/18/2024, 11:23 AM   CHEST: Doorway, Bilateral - Standing    Stand  with one foot in front of the other. Standing in doorway, place hands on wall with elbows bent at shoulder height. Lean forward. Hold _20-30__ seconds. _2-3__ reps per set, _4__ sets per day  Cancer Rehab [phone]  PHYSICAL THERAPY DISCHARGE SUMMARY  Visits from Start of Care: 14  Current functional level related to goals / functional outcomes: All goals met   Remaining deficits: None   Education / Equipment: HEP, lymphedema   Patient agrees to discharge. Patient goals were met. Patient is being discharged due to meeting the stated rehab goals.

## 2024-05-19 DIAGNOSIS — G4733 Obstructive sleep apnea (adult) (pediatric): Secondary | ICD-10-CM | POA: Diagnosis not present

## 2024-05-19 NOTE — Progress Notes (Signed)
 Katie [name]                                          MRN: [id number]   05/19/2024   The [facility] Quality Team Specialist reviewed this patient medical record for the purposes of chart review for care gap closure. The following were reviewed: chart review for care gap closure-kidney health evaluation for diabetes:eGFR  and uACR.    [facility] Quality Team

## 2024-05-31 DIAGNOSIS — M1990 Unspecified osteoarthritis, unspecified site: Secondary | ICD-10-CM | POA: Diagnosis not present

## 2024-05-31 DIAGNOSIS — C50919 Malignant neoplasm of unspecified site of unspecified female breast: Secondary | ICD-10-CM | POA: Diagnosis not present

## 2024-05-31 DIAGNOSIS — N1832 Chronic kidney disease, stage 3b: Secondary | ICD-10-CM | POA: Diagnosis not present

## 2024-05-31 DIAGNOSIS — I2510 Atherosclerotic heart disease of native coronary artery without angina pectoris: Secondary | ICD-10-CM | POA: Diagnosis not present

## 2024-05-31 DIAGNOSIS — E1122 Type 2 diabetes mellitus with diabetic chronic kidney disease: Secondary | ICD-10-CM | POA: Diagnosis not present

## 2024-05-31 DIAGNOSIS — M810 Age-related osteoporosis without current pathological fracture: Secondary | ICD-10-CM | POA: Diagnosis not present

## 2024-05-31 DIAGNOSIS — E785 Hyperlipidemia, unspecified: Secondary | ICD-10-CM | POA: Diagnosis not present

## 2024-05-31 DIAGNOSIS — R32 Unspecified urinary incontinence: Secondary | ICD-10-CM | POA: Diagnosis not present

## 2024-05-31 DIAGNOSIS — E1142 Type 2 diabetes mellitus with diabetic polyneuropathy: Secondary | ICD-10-CM | POA: Diagnosis not present

## 2024-06-01 NOTE — Progress Notes (Signed)
    Assessment & Plan:  1. Bronchiectasis without complication (HCC) (Primary)  2. Pulmonary hypertension (HCC) - mild - Home sleep test; Future  3. Snoring - Home sleep test; Future   Patient Instructions  Your breathing tests were good.  We are going to schedule a home sleep study.  Follow-up in 3 months time call sooner should any new problems arise.         Please note: late entry documentation due to logistical difficulties during COVID-19 pandemic. This note is filed for information purposes only, and is not intended to be used for billing, nor does it represent the full scope/nature of the visit in question. Please see any associated scanned media linked to date of encounter for additional pertinent information.  Subjective:    HPI: Katie [name] is a 78 y.o. female presenting to the pulmonology clinic on 04/12/2020 with report of: Follow-up (PFT 02/17/2020. c/o chest congestion mainly in the morning and occ sob with exertion. )     Outpatient Encounter Medications as of 04/12/2020  Medication Sig   Calcium  Carbonate-Vit D-Min (CALTRATE 600+D PLUS MINERALS) 600-800 MG-UNIT TABS Take 1 tablet by mouth in the morning and at bedtime.   clobetasol (TEMOVATE) 0.05 % external solution Apply 1 application topically 2 (two) times daily as needed (scalp). Reported on 08/16/2015   loratadine  (CLARITIN ) 10 MG tablet Take 10 mg by mouth daily.   Multiple Vitamins-Minerals (CENTRUM SILVER 50+WOMEN) TABS Take 1 tablet by mouth daily.   Polyethyl Glycol-Propyl Glycol (SYSTANE OP) Apply to eye as needed.   [DISCONTINUED] anastrozole  (ARIMIDEX ) 1 MG tablet Take 1 tablet (1 mg total) by mouth daily.   [DISCONTINUED] aspirin  81 MG EC tablet Take 81 mg by mouth daily. Swallow whole.   [DISCONTINUED] atorvastatin  (LIPITOR) 20 MG tablet Take 1 tablet (20 mg total) by mouth daily.   [DISCONTINUED] fluticasone  (FLONASE ) 50 MCG/ACT nasal spray SHAKE LIQUID AND USE 2 SPRAYS IN EACH  NOSTRIL EVERY DAY   [DISCONTINUED] gabapentin  (NEURONTIN ) 300 MG capsule Take 1 capsule (300 mg total) by mouth at bedtime.   [DISCONTINUED] glucose blood (FREESTYLE LITE) test strip USE TO CHECK BLOOD SUGAR DAILY   [DISCONTINUED] ketoconazole (NIZORAL) 2 % cream APPLY TO THE AFFECTED AREA ON FACE TWICE DAILY UNTIL CLEAR (Patient not taking: Reported on 10/16/2020)   [DISCONTINUED] losartan -hydrochlorothiazide  (HYZAAR) 100-25 MG tablet Take 1 tablet by mouth daily.   [DISCONTINUED] metFORMIN  (GLUCOPHAGE ) 500 MG tablet Take 1 tablet (500 mg total) by mouth 2 (two) times daily with a meal.   [DISCONTINUED] prochlorperazine  (COMPAZINE ) 10 MG tablet Take 1 tablet (10 mg total) by mouth every 6 (six) hours as needed (Nausea or vomiting).   No facility-administered encounter medications on file as of 04/12/2020.      Objective:   Vitals:   04/12/20 0827  BP: (!) 142/84  Pulse: 77  Temp: (!) 97.3 F (36.3 C)  Height: 5' 2 (1.575 m)  Weight: 163 lb (73.9 kg)  SpO2: 96%  TempSrc: Temporal  BMI (Calculated): 29.81     Physical exam documentation is limited by delayed entry of information.

## 2024-06-10 ENCOUNTER — Ambulatory Visit: Payer: Self-pay | Admitting: Family Medicine

## 2024-06-10 ENCOUNTER — Ambulatory Visit (INDEPENDENT_AMBULATORY_CARE_PROVIDER_SITE_OTHER)

## 2024-06-10 ENCOUNTER — Other Ambulatory Visit (INDEPENDENT_AMBULATORY_CARE_PROVIDER_SITE_OTHER)

## 2024-06-10 VITALS — Ht 62.5 in | Wt 153.0 lb

## 2024-06-10 DIAGNOSIS — E113293 Type 2 diabetes mellitus with mild nonproliferative diabetic retinopathy without macular edema, bilateral: Secondary | ICD-10-CM | POA: Diagnosis not present

## 2024-06-10 DIAGNOSIS — E538 Deficiency of other specified B group vitamins: Secondary | ICD-10-CM | POA: Diagnosis not present

## 2024-06-10 DIAGNOSIS — D509 Iron deficiency anemia, unspecified: Secondary | ICD-10-CM

## 2024-06-10 DIAGNOSIS — E559 Vitamin D deficiency, unspecified: Secondary | ICD-10-CM

## 2024-06-10 DIAGNOSIS — Z0000 Encounter for general adult medical examination without abnormal findings: Secondary | ICD-10-CM | POA: Diagnosis not present

## 2024-06-10 LAB — MICROALBUMIN / CREATININE URINE RATIO
Creatinine,U: 52.2 mg/dL
Microalb Creat Ratio: 15.7 mg/g (ref 0.0–30.0)
Microalb, Ur: 0.8 mg/dL (ref 0.0–1.9)

## 2024-06-10 LAB — CBC WITH DIFFERENTIAL/PLATELET
Basophils Absolute: 0 K/uL (ref 0.0–0.1)
Basophils Relative: 1.2 % (ref 0.0–3.0)
Eosinophils Absolute: 0.1 K/uL (ref 0.0–0.7)
Eosinophils Relative: 3.3 % (ref 0.0–5.0)
HCT: 38.4 % (ref 36.0–46.0)
Hemoglobin: 13 g/dL (ref 12.0–15.0)
Lymphocytes Relative: 18.7 % (ref 12.0–46.0)
Lymphs Abs: 0.7 K/uL (ref 0.7–4.0)
MCHC: 33.9 g/dL (ref 30.0–36.0)
MCV: 96.1 fl (ref 78.0–100.0)
Monocytes Absolute: 0.6 K/uL (ref 0.1–1.0)
Monocytes Relative: 17.7 % — ABNORMAL HIGH (ref 3.0–12.0)
Neutro Abs: 2.1 K/uL (ref 1.4–7.7)
Neutrophils Relative %: 59.1 % (ref 43.0–77.0)
Platelets: 254 K/uL (ref 150.0–400.0)
RBC: 4 Mil/uL (ref 3.87–5.11)
RDW: 14.8 % (ref 11.5–15.5)
WBC: 3.5 K/uL — ABNORMAL LOW (ref 4.0–10.5)

## 2024-06-10 LAB — IBC + FERRITIN
Ferritin: 256.2 ng/mL (ref 10.0–291.0)
Iron: 122 ug/dL (ref 42–145)
Saturation Ratios: 23.4 % (ref 20.0–50.0)
TIBC: 520.8 ug/dL — ABNORMAL HIGH (ref 250.0–450.0)
Transferrin: 372 mg/dL — ABNORMAL HIGH (ref 212.0–360.0)

## 2024-06-10 LAB — VITAMIN B12: Vitamin B-12: 340 pg/mL (ref 211–911)

## 2024-06-10 LAB — VITAMIN D 25 HYDROXY (VIT D DEFICIENCY, FRACTURES): VITD: 52.37 ng/mL (ref 30.00–100.00)

## 2024-06-10 NOTE — Patient Instructions (Signed)
 Katie [name],  Thank you for taking the time for your Medicare Wellness Visit. I appreciate your continued commitment to your health goals. Please review the care plan we discussed, and feel free to reach out if I can assist you further.  Medicare recommends these wellness visits once per year to help you and your care team stay ahead of potential health issues. These visits are designed to focus on prevention, allowing your provider to concentrate on managing your acute and chronic conditions during your regular appointments.  Please note that Annual Wellness Visits do not include a physical exam. Some assessments may be limited, especially if the visit was conducted virtually. If needed, we may recommend a separate in-person follow-up with your provider.  Ongoing Care Seeing your primary care provider every 3 to 6 months helps us  monitor your health and provide consistent, personalized care.   Referrals If a referral was made during today's visit and you haven't received any updates within two weeks, please contact the referred provider directly to check on the status.  Recommended Screenings:  Health Maintenance  Topic Date Due   Yearly kidney health urinalysis for diabetes  Never done   Flu Shot  03/26/2024   COVID-19 Vaccine (9 - 2025-26 season) 04/26/2024   Medicare Annual Wellness Visit  06/08/2024   Complete foot exam   06/09/2024   Hemoglobin A1C  06/11/2024   DEXA scan (bone density measurement)  10/04/2024   Eye exam for diabetics  11/18/2024   Yearly kidney function blood test for diabetes  12/10/2024   Colon Cancer Screening  04/14/2028   DTaP/Tdap/Td vaccine (3 - Tdap) 05/12/2030   Pneumococcal Vaccine for age over 13  Completed   Hepatitis C Screening  Completed   Zoster (Shingles) Vaccine  Completed   Meningitis B Vaccine  Aged Out   Hepatitis B Vaccine  Discontinued   Breast Cancer Screening  Discontinued       03/09/2024    3:13 PM  Advanced Directives  Does  Patient Have a Medical Advance Directive? Yes  Type of Advance Directive Healthcare Power of Attorney  Does patient want to make changes to medical advance directive? Yes (MAU/Ambulatory/Procedural Areas - Information given)  Copy of Healthcare Power of Attorney in Chart? No - copy requested   Advance Care Planning is important because it: Ensures you receive medical care that aligns with your values, goals, and preferences. Provides guidance to your family and loved ones, reducing the emotional burden of decision-making during critical moments.  Vision: Annual vision screenings are recommended for early detection of glaucoma, cataracts, and diabetic retinopathy. These exams can also reveal signs of chronic conditions such as diabetes and high blood pressure.  Dental: Annual dental screenings help detect early signs of oral cancer, gum disease, and other conditions linked to overall health, including heart disease and diabetes.

## 2024-06-10 NOTE — Progress Notes (Signed)
 Subjective:   Katie [name] is a 78 y.o. who presents for a Medicare Wellness preventive visit.  As a reminder, Annual Wellness Visits don't include a physical exam, and some assessments may be limited, especially if this visit is performed virtually. We may recommend an in-person follow-up visit with your provider if needed.  Visit Complete: Virtual I connected with  Katie [name] on 06/10/24 by a audio enabled telemedicine application and verified that I am speaking with the correct person using two identifiers.  Patient Location: Home  Provider Location: Office/Clinic  I discussed the limitations of evaluation and management by telemedicine. The patient expressed understanding and agreed to proceed.  Vital Signs: Because this visit was a virtual/telehealth visit, some criteria may be missing or patient reported. Any vitals not documented were not able to be obtained and vitals that have been documented are patient reported.  VideoDeclined- This patient declined Librarian, academic. Therefore the visit was completed with audio only.  Persons Participating in Visit: Patient.  AWV Questionnaire: No: Patient Medicare AWV questionnaire was not completed prior to this visit.  Cardiac Risk Factors include: advanced age (>65men, >74 women);diabetes mellitus;hypertension;dyslipidemia     Objective:    Today's Vitals   06/10/24 1014  Weight: 153 lb (69.4 kg)  Height: 5' 2.5 (1.588 m)   Body mass index is 27.54 kg/m.     06/10/2024   10:28 AM 03/09/2024    3:13 PM 12/10/2023   10:59 AM 06/09/2023    8:09 AM 03/16/2023   11:38 AM 03/16/2023   10:22 AM 12/31/2022    6:34 AM  Advanced Directives  Does Patient Have a Medical Advance Directive? Yes Yes Yes Yes Yes Yes No  Type of Estate agent of [place];Living will Healthcare Power of [employer] of [place];Living will Healthcare Power of [employer] Power of  Attorney   Does patient want to make changes to medical advance directive?  Yes (MAU/Ambulatory/Procedural Areas - Information given) No - Patient declined      Copy of Healthcare Power of Attorney in Chart? Yes - validated most recent copy scanned in chart (See row information) No - copy requested  No - copy requested     Would patient like information on creating a medical advance directive?       No - Patient declined    Current Medications (verified) Outpatient Encounter Medications as of 06/10/2024  Medication Sig   ACCU-CHEK GUIDE TEST test strip Use to check blood sugar up to 2 times of day   albuterol  (VENTOLIN  HFA) 108 (90 Base) MCG/ACT inhaler Inhale 2 puffs into the lungs every 6 (six) hours as needed.   alendronate  (FOSAMAX ) 70 MG tablet TAKE 1 TABLET(70 MG) BY MOUTH 1 TIME A WEEK WITH A FULL GLASS OF WATER AND ON AN EMPTY STOMACH   anastrozole  (ARIMIDEX ) 1 MG tablet TAKE 1 TABLET(1 MG) BY MOUTH DAILY   aspirin  EC 81 MG tablet Take 81 mg by mouth daily. Swallow whole.   atorvastatin  (LIPITOR) 40 MG tablet Take 1 tablet (40 mg total) by mouth daily.   budesonide -formoterol  (SYMBICORT ) 160-4.5 MCG/ACT inhaler INHALE 2 PUFFS INTO THE LUNGS IN THE MORNING AND AT BEDTIME   Calcium  Carbonate-Vit D-Min (CALTRATE 600+D PLUS MINERALS) 600-800 MG-UNIT TABS Take 1 tablet by mouth in the morning and at bedtime.   clobetasol (TEMOVATE) 0.05 % external solution Apply 1 application topically 2 (two) times daily as needed (scalp). Reported on 08/16/2015  Dulaglutide  (TRULICITY ) 3 MG/0.5ML SOAJ Inject 3 mg as directed once a week.   fenofibrate  (TRICOR ) 145 MG tablet Take 1 tablet (145 mg total) by mouth daily.   fluticasone  (FLONASE ) 50 MCG/ACT nasal spray SHAKE LIQUID AND USE 2 SPRAYS IN EACH NOSTRIL EVERY DAY   gabapentin  (NEURONTIN ) 300 MG capsule TAKE 1 CAPSULE(300 MG) BY MOUTH AT BEDTIME   JARDIANCE  10 MG TABS tablet TAKE 1 TABLET(10 MG) BY MOUTH DAILY BEFORE BREAKFAST   loratadine   (CLARITIN ) 10 MG tablet Take 10 mg by mouth daily.   losartan -hydrochlorothiazide  (HYZAAR) 100-25 MG tablet TAKE 1 TABLET BY MOUTH DAILY   metroNIDAZOLE (METROCREAM) 0.75 % cream Apply 1 Application topically 2 (two) times daily as needed.   Multiple Vitamins-Minerals (CENTRUM SILVER 50+WOMEN) TABS Take 1 tablet by mouth daily.   Polyethyl Glycol-Propyl Glycol (SYSTANE OP) Apply to eye as needed.   Probiotic Product (PROBIOTIC DAILY PO) Take by mouth. Schiff Digestive Advantage   psyllium (METAMUCIL) 58.6 % powder Take 1 packet by mouth daily.   valACYclovir  (VALTREX ) 500 MG tablet TAKE 1 TABLET(500 MG) BY MOUTH TWICE DAILY   No facility-administered encounter medications on file as of 06/10/2024.    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, small, not treating-watching , it is now stented   Blood transfusion without reported diagnosis    Breast cancer (HCC) 09/07/2018   Cancer (HCC)    breast 08/2018   Cataract 2019   Coronary artery disease    Diverticulitis    DJD (degenerative joint disease)    DM type 2 (diabetes mellitus, type 2) (HCC)    Family history of breast cancer    Fibrocystic breast disease    GERD (gastroesophageal reflux disease)    Heart murmur 07/06/19   History of chicken pox    Hypercholesteremia    Hypertension    Lichen planopilaris    Neuromuscular disorder (HCC)    Sleep apnea    cpap   Ulcer    Urinary incontinence    Vertigo 12/2019   1 episode   Past Surgical History:  Procedure Laterality Date   BREAST IMPLANT REMOVAL Bilateral 03/08/2019   Procedure: Removal of Bilateral Breast Implants;  Surgeon: Ebbie Cough, MD;  Location: [facility] OR;  Service: General;  Laterality: Bilateral;   BREAST SURGERY  02/1975   Left Breast Biopsy-Fibrocystic Disease   BREAST SURGERY   10/1981   Bialteral Capsulectomy Breast Surgery   BREAST SURGERY  07/1998   Replace Bilateral Silicone Implants   BREAST SURGERY  01/1976   Bilater breast surgery to remove fibrocystic tissue and replace with silicone implants   CATARACT EXTRACTION W/PHACO Right 02/08/2020   Procedure: CATARACT EXTRACTION PHACO AND INTRAOCULAR LENS PLACEMENT (IOC) RIGHT DIABETIC;  Surgeon: Jaye Fallow, MD;  Location: [facility];  Service: Ophthalmology;  Laterality: Right;  3.55 0:33.7   CATARACT EXTRACTION W/PHACO Left 02/29/2020   Procedure: CATARACT EXTRACTION PHACO AND INTRAOCULAR LENS PLACEMENT (IOC) LEFT DIABETIC 2.93  00:27.1;  Surgeon: Jaye Fallow, MD;  Location: [facility];  Service: Ophthalmology;  Laterality: Left;  Diabetic - oral meds   CHOLECYSTECTOMY N/A 07/08/2016   Procedure: LAPAROSCOPIC CHOLECYSTECTOMY;  Surgeon: Carlin Pastel, MD;  Location: [facility] ORS;  Service: General;  Laterality: N/A;   COLONOSCOPY WITH PROPOFOL  N/A 11/07/2017   Procedure: COLONOSCOPY WITH PROPOFOL ;  Surgeon:  Therisa Bi, MD;  Location: [facility] END[facility]COPY;  Service: Gastroenterology;  Laterality: N/A;   COSMETIC SURGERY  Several   Breast   DILATION AND CURETTAGE OF UTERUS  01/1994   ESOPHAGOGASTRODUODENOSCOPY (EGD) WITH PROPOFOL  N/A 12/05/2016   Procedure: ESOPHAGOGASTRODUODENOSCOPY (EGD) WITH PROPOFOL ;  Surgeon: Bi Therisa, MD;  Location: [facility] ENDOSCOPY;  Service: Endoscopy;  Laterality: N/A;   EYE SURGERY  02-2020   FINGER SURGERY  2001 & 2003   Trigger Finger   FOOT SURGERY  1980   To Relieve Pinched Nerve   FOOT SURGERY  07/2008   Left Foot-Plantar Fasciitis   JOINT REPLACEMENT  04/19/2013   Hip Replacement-Right   MASTECTOMY     double   MASTECTOMY W/ SENTINEL NODE BIOPSY Bilateral 03/08/2019   Procedure: BILATERAL MASTECTOMIES WITH LEFT AXILLARY SENTINEL LYMPH NODE BIOPSY AND BLUE DYE INJECTION;  Surgeon: Ebbie Cough, MD;  Location: [facility] OR;  Service: General;  Laterality:  Bilateral;   PARTIAL KNEE ARTHROPLASTY Right 12/31/2022   Procedure: RIGHT PARTIAL KNEE ARTHROPLASTY;  Surgeon: Edie Norleen PARAS, MD;  Location: [facility] ORS;  Service: Orthopedics;  Laterality: Right;   PLACEMENT OF BREAST IMPLANTS  12/26/2004   Replace Failed Implants   REPLACEMENT TOTAL KNEE Left    2019-   RHINOPLASTY  03/1964   To Correct Deviated Septum   Family History  Problem Relation Age of Onset   Arthritis Mother    Hypertension Mother    Obesity Mother    Varicose Veins Mother    Heart disease Father    Diabetes Father    Heart attack Father    Breast cancer Sister 68       d. 84   Cancer Sister    Early death Sister    Hypertension Brother    Leukemia Brother    Early death Brother    Heart disease Brother    Coronary artery disease Brother    Heart attack Brother    Diabetes Brother    Hypertension Brother    Social History   Socioeconomic History   Marital status: Married    Spouse name: Eveline   Number of children: 1   Years of education: Not on file   Highest education level: Master's degree (e.g., MA, MS, MEng, MEd, MSW, MBA)  Occupational History   Occupation: Retired  Tobacco Use   Smoking status: Never   Smokeless tobacco: Never  Vaping Use   Vaping status: Never Used  Substance and Sexual Activity   Alcohol use: Not Currently    Alcohol/week: 0.0 standard drinks of alcohol    Comment: rarely   Drug use: No   Sexual activity: Not Currently    Partners: Male  Other Topics Concern   Not on file  Social History Narrative   Married for 37 years.    She has one child and one stepston.      Social Drivers of Corporate investment banker Strain: Low Risk  (06/10/2024)   Overall Financial Resource Strain (CARDIA)    Difficulty of Paying Living Expenses: Not hard at all  Food Insecurity: No Food Insecurity (06/10/2024)   Hunger Vital Sign    Worried About Running Out of Food in the Last Year: Never true    Ran Out of Food in the Last Year:  Never true  Transportation Needs: No Transportation Needs (06/10/2024)   PRAPARE - Administrator, Civil Service (Medical): No    Lack of Transportation (Non-Medical): No  Physical Activity: Sufficiently Active (  06/10/2024)   Exercise Vital Sign    Days of Exercise per Week: 3 days    Minutes of Exercise per Session: 60 min  Stress: No Stress Concern Present (06/10/2024)   [employer] of Occupational Health - Occupational Stress Questionnaire    Feeling of Stress: Not at all  Social Connections: Moderately Integrated (06/10/2024)   Social Connection and Isolation Panel    Frequency of Communication with Friends and Family: Once a week    Frequency of Social Gatherings with Friends and Family: Once a week    Attends Religious Services: More than 4 times per year    Active Member of [employer] or Organizations: Yes    Attends Engineer, structural: More than 4 times per year    Marital Status: Married    Tobacco Counseling Counseling given: Not Answered    Clinical Intake:  Pre-visit preparation completed: Yes  Pain : No/denies pain     BMI - recorded: 27.54 Nutritional Status: BMI 25 -29 Overweight Nutritional Risks: None Diabetes: Yes CBG done?: Yes (BS 150 this am at home) CBG resulted in Enter/ Edit results?: No Did pt. bring in CBG monitor from home?: No  Lab Results  Component Value Date   HGBA1C 7.3 (H) 12/11/2023   HGBA1C 8.4 (H) 09/03/2023   HGBA1C 7.2 (H) 06/03/2023     How often do you need to have someone help you when you read instructions, pamphlets, or other written materials from your doctor or pharmacy?: 1 - Never  Interpreter Needed?: No  Comments: lives with spouse Information entered by :: B.Tanganika Barradas,LPN   Activities of Daily Living     06/10/2024   10:28 AM  In your present state of health, do you have any difficulty performing the following activities:  Hearing? 0  Vision? 0  Difficulty concentrating or making  decisions? 0  Walking or climbing stairs? 0  Dressing or bathing? 0  Doing errands, shopping? 0  Preparing Food and eating ? N  Using the Toilet? N  In the past six months, have you accidently leaked urine? Y  Do you have problems with loss of bowel control? N  Managing your Medications? N  Managing your Finances? N  Housekeeping or managing your Housekeeping? N    Patient Care Team: Avelina Greig BRAVO, MD as PCP - General (Family Medicine) Darliss Rogue, MD as PCP - Cardiology (Cardiology) Jaye Fallow, MD as Referring Physician (Ophthalmology) Chrystie Edmund CROME, MD as Consulting Physician (Dermatology) Shelva Dunnings, MD as Consulting Physician (General Surgery) Danley Shuck, MD as Consulting Physician (Dentistry) Odean Potts, MD as Consulting Physician (Hematology and Oncology) Izell Domino, MD as Attending Physician (Radiation Oncology) Ebbie Cough, MD as Consulting Physician (General Surgery)  I have updated your Care Teams any recent Medical Services you may have received from other providers in the past year.     Assessment:   This is a routine wellness examination for Katie [name].  Hearing/Vision screen Hearing Screening - Comments:: Patient denies any hearing difficulties.   Vision Screening - Comments:: Pt says their vision is good with glasses Dr  Jaye   Goals Addressed             This Visit's Progress    DIET - INCREASE WATER INTAKE       06/10/24 still working on this     Patient Stated       06/10/24- I will continue to take medications as prescribed.      Patient Stated  06/10/24- I will continue to [facility] to the senior center and exercise 3 days a week for about 30 minutes- 1 hour.        Depression Screen     06/10/2024   10:25 AM 12/18/2023    8:23 AM 09/10/2023    8:55 AM 06/09/2023    8:07 AM 12/06/2022    8:23 AM 06/06/2022   10:45 AM 11/23/2021    1:39 PM  PHQ 2/9 Scores  PHQ - 2 Score 0 0 0 0 0 0 0  PHQ- 9 Score    0        Fall Risk     06/10/2024   10:19 AM 12/18/2023    8:23 AM 09/10/2023    8:55 AM 06/09/2023    8:06 AM 06/06/2023    5:50 PM  Fall Risk   Falls in the past year? 0 0 0 0 0  Number falls in past yr: 0 0 0 0 0  Injury with Fall? 0 0 0 0   Risk for fall due to : No Fall Risks No Fall Risks No Fall Risks No Fall Risks   Follow up Falls prevention discussed;Education provided Falls evaluation completed Falls evaluation completed Falls prevention discussed     MEDICARE RISK AT HOME:  Medicare Risk at Home Any stairs in or around the home?: Yes If so, are there any without handrails?: Yes Home free of loose throw rugs in walkways, pet beds, electrical cords, etc?: Yes Adequate lighting in your home to reduce risk of falls?: Yes Life alert?: No (has panic button w/home system) Use of a cane, walker or w/c?: No Grab bars in the bathroom?: Yes Shower chair or bench in shower?: No Elevated toilet seat or a handicapped toilet?: Yes  TIMED UP AND GO:  Was the test performed?  No  Cognitive Function: 6CIT completed    05/02/2020   11:22 AM 01/06/2019    9:19 AM 10/13/2017    8:15 AM 10/08/2016    9:07 AM  MMSE - Mini Mental State Exam  Orientation to time 5 5 5  5    Orientation to Place 5 5 5  5    Registration 3 3 3  3    Attention/ Calculation 5 0 0  0   Recall 3 3 3  3    Language- name 2 objects  0 0  0   Language- repeat 1 1 1 1   Language- follow 3 step command  0 3  3   Language- read & follow direction  0 0  0   Write a sentence  0 0  0   Copy design  0 0  0   Total score  17 20  20       Data saved with a previous flowsheet row definition        06/10/2024   10:30 AM 06/09/2023    8:09 AM  6CIT Screen  What Year? 0 points 0 points  What month? 0 points 0 points  What time? 0 points 0 points  Count back from 20 0 points 0 points  Months in reverse 0 points 0 points  Repeat phrase 0 points 0 points  Total Score 0 points 0 points     Immunizations Immunization History  Administered Date(s) Administered    sv, Bivalent, Protein Subunit Rsvpref,pf [place]) 06/04/2022   Fluad Quad(high Dose 65+) 04/30/2019, 05/12/2020, 04/06/2022, 04/19/2023   Fluad Trivalent(High Dose 65+) 04/19/2024   Hep A / Hep B 05/29/2009, 07/03/2009,  11/24/2009, 11/28/2021   Hepb-cpg 10/26/2021, 11/28/2021   INFLUENZA, HIGH DOSE SEASONAL PF 04/18/2014, 04/13/2015, 04/30/2018, 04/10/2021   Influenza, Seasonal, Injecte, Preservative Fre 06/26/2016   Influenza-Unspecified 05/17/2009, 04/02/2011, 05/20/2012, 06/02/2013, 07/01/2013, 04/18/2014, 04/13/2015, 06/27/2016, 04/11/2017   Moderna Covid-19 Fall Seasonal Vaccine 62yrs & older 05/22/2024   Moderna Covid-19 Vaccine Bivalent Booster 80yrs & up 04/06/2022   PFIZER Comirnaty(Gray Top)Covid-19 Tri-Sucrose Vaccine 11/29/2020   PFIZER(Purple Top)SARS-COV-2 Vaccination 10/06/2019, 10/27/2019, 04/24/2020   Pfizer Covid-19 Vaccine Bivalent Booster 70yrs & up 05/02/2021   Pfizer(Comirnaty)Fall Seasonal Vaccine 12 years and older 05/02/2023   Pneumococcal Conjugate-13 04/18/2014   Pneumococcal Polysaccharide-23 10/06/2015   Td 05/17/2009, 05/12/2020   Typhoid Inactivated 05/17/2009   Unspecified SARS-COV-2 Vaccination 06/04/2022   Yellow Fever 05/17/2009   Zoster Recombinant(Shingrix) 06/07/2021, 08/11/2021   Zoster, Live 04/02/2011    Screening Tests Health Maintenance  Topic Date Due   Diabetic kidney evaluation - Urine ACR  Never done   FOOT EXAM  06/09/2024   HEMOGLOBIN A1C  06/11/2024   DEXA SCAN  10/04/2024   OPHTHALMOLOGY EXAM  11/18/2024   COVID-19 Vaccine (10 - Pfizer risk 2025-26 season) 11/19/2024   Diabetic kidney evaluation - eGFR measurement  12/10/2024   Medicare Annual Wellness (AWV)  06/10/2025   Colonoscopy  04/14/2028   DTaP/Tdap/Td (3 - Tdap) 05/12/2030   Pneumococcal Vaccine: 50+ Years  Completed   Influenza Vaccine  Completed   Hepatitis C Screening  Completed    Zoster Vaccines- Shingrix  Completed   Meningococcal B Vaccine  Aged Out   Hepatitis B Vaccines 19-59 Average Risk  Discontinued   Mammogram  Discontinued    Health Maintenance Items Addressed: None at this time. Pt says she has had Influenza and Covid vaccines at [facility] Foot Exam at PCP next week  Additional Screening:  Vision Screening: Recommended annual ophthalmology exams for early detection of glaucoma and other disorders of the eye. Is the patient up to date with their annual eye exam?  Yes  Who is the provider or what is the name of the office in which the patient attends annual eye exams? Dr Jaye  Dental Screening: Recommended annual dental exams for proper oral hygiene  Community Resource Referral / Chronic Care Management: CRR required this visit?  No   CCM required this visit?  No   Plan:    I have personally reviewed and noted the following in the patient's chart:   Medical and social history Use of alcohol, tobacco or illicit drugs  Current medications and supplements including opioid prescriptions. Patient is not currently taking opioid prescriptions. Functional ability and status Nutritional status Physical activity Advanced directives List of other physicians Hospitalizations, surgeries, and ER visits in previous 12 months Vitals Screenings to include cognitive, depression, and falls Referrals and appointments  In addition, I have reviewed and discussed with patient certain preventive protocols, quality metrics, and best practice recommendations. A written personalized care plan for preventive services as well as general preventive health recommendations were provided to patient.   Katie STRZELECKI, LPN   [id number]   After Visit Summary: (MyChart) Due to this being a telephonic visit, the after visit summary with patients personalized plan was offered to patient via MyChart   Notes: Nothing significant to report at this time.

## 2024-06-10 NOTE — Progress Notes (Signed)
 No critical labs need to be addressed urgently. We will discuss labs in detail at upcoming office visit.

## 2024-06-11 ENCOUNTER — Ambulatory Visit

## 2024-06-13 ENCOUNTER — Other Ambulatory Visit: Payer: Self-pay | Admitting: Family Medicine

## 2024-06-18 ENCOUNTER — Encounter: Admitting: Family Medicine

## 2024-06-18 ENCOUNTER — Other Ambulatory Visit: Payer: Self-pay | Admitting: Family Medicine

## 2024-06-18 ENCOUNTER — Encounter: Payer: Self-pay | Admitting: Family Medicine

## 2024-06-18 DIAGNOSIS — E113293 Type 2 diabetes mellitus with mild nonproliferative diabetic retinopathy without macular edema, bilateral: Secondary | ICD-10-CM

## 2024-06-18 DIAGNOSIS — E782 Mixed hyperlipidemia: Secondary | ICD-10-CM

## 2024-06-23 ENCOUNTER — Other Ambulatory Visit

## 2024-06-23 ENCOUNTER — Ambulatory Visit: Payer: Self-pay | Admitting: Family Medicine

## 2024-06-23 DIAGNOSIS — E782 Mixed hyperlipidemia: Secondary | ICD-10-CM | POA: Diagnosis not present

## 2024-06-23 DIAGNOSIS — E113293 Type 2 diabetes mellitus with mild nonproliferative diabetic retinopathy without macular edema, bilateral: Secondary | ICD-10-CM | POA: Diagnosis not present

## 2024-06-23 LAB — COMPREHENSIVE METABOLIC PANEL WITH GFR
ALT: 27 U/L (ref 0–35)
AST: 27 U/L (ref 0–37)
Albumin: 4.1 g/dL (ref 3.5–5.2)
Alkaline Phosphatase: 38 U/L — ABNORMAL LOW (ref 39–117)
BUN: 18 mg/dL (ref 6–23)
CO2: 26 meq/L (ref 19–32)
Calcium: 9.1 mg/dL (ref 8.4–10.5)
Chloride: 99 meq/L (ref 96–112)
Creatinine, Ser: 1.11 mg/dL (ref 0.40–1.20)
GFR: 47.26 mL/min — ABNORMAL LOW (ref >60.00)
Glucose, Bld: 154 mg/dL — ABNORMAL HIGH (ref 70–99)
Potassium: 4 meq/L (ref 3.5–5.1)
Sodium: 134 meq/L — ABNORMAL LOW (ref 135–145)
Total Bilirubin: 0.7 mg/dL (ref 0.2–1.2)
Total Protein: 6.6 g/dL (ref 6.0–8.3)

## 2024-06-23 LAB — LIPID PANEL
Cholesterol: 152 mg/dL (ref 0–200)
HDL: 45.5 mg/dL (ref >39.00)
LDL Cholesterol: 82 mg/dL (ref 0–99)
NonHDL: 106.74
Total CHOL/HDL Ratio: 3
Triglycerides: 123 mg/dL (ref 0.0–149.0)
VLDL: 24.6 mg/dL (ref 0.0–40.0)

## 2024-06-23 LAB — HEMOGLOBIN A1C: Hgb A1c MFr Bld: 7.4 % — ABNORMAL HIGH (ref 4.6–6.5)

## 2024-06-23 NOTE — Progress Notes (Signed)
 No critical labs need to be addressed urgently. We will discuss labs in detail at upcoming office visit.

## 2024-06-25 ENCOUNTER — Ambulatory Visit (INDEPENDENT_AMBULATORY_CARE_PROVIDER_SITE_OTHER): Admitting: Family Medicine

## 2024-06-25 ENCOUNTER — Encounter: Payer: Self-pay | Admitting: Family Medicine

## 2024-06-25 VITALS — BP 110/42 | HR 74 | Temp 97.9°F | Ht 61.75 in | Wt 157.0 lb

## 2024-06-25 DIAGNOSIS — E785 Hyperlipidemia, unspecified: Secondary | ICD-10-CM

## 2024-06-25 DIAGNOSIS — R21 Rash and other nonspecific skin eruption: Secondary | ICD-10-CM

## 2024-06-25 DIAGNOSIS — I152 Hypertension secondary to endocrine disorders: Secondary | ICD-10-CM

## 2024-06-25 DIAGNOSIS — E113293 Type 2 diabetes mellitus with mild nonproliferative diabetic retinopathy without macular edema, bilateral: Secondary | ICD-10-CM | POA: Diagnosis not present

## 2024-06-25 DIAGNOSIS — E1159 Type 2 diabetes mellitus with other circulatory complications: Secondary | ICD-10-CM

## 2024-06-25 DIAGNOSIS — J479 Bronchiectasis, uncomplicated: Secondary | ICD-10-CM | POA: Diagnosis not present

## 2024-06-25 DIAGNOSIS — Z0000 Encounter for general adult medical examination without abnormal findings: Secondary | ICD-10-CM

## 2024-06-25 DIAGNOSIS — Z170 Estrogen receptor positive status [ER+]: Secondary | ICD-10-CM

## 2024-06-25 DIAGNOSIS — E1169 Type 2 diabetes mellitus with other specified complication: Secondary | ICD-10-CM

## 2024-06-25 DIAGNOSIS — R5383 Other fatigue: Secondary | ICD-10-CM

## 2024-06-25 LAB — HM DIABETES FOOT EXAM

## 2024-06-25 MED ORDER — EMPAGLIFLOZIN 25 MG PO TABS
25.0000 mg | ORAL_TABLET | Freq: Every day | ORAL | 11 refills | Status: AC
Start: 2024-06-25 — End: ?

## 2024-06-25 NOTE — Assessment & Plan Note (Signed)
Secondary to chemo. Followed by pulmonary.

## 2024-06-25 NOTE — Assessment & Plan Note (Addendum)
 Active treatment by oncology with Arimidex  on year  5 of 7.

## 2024-06-25 NOTE — Assessment & Plan Note (Signed)
Associated with diabetes. Yearly ophthalmology exam completed.

## 2024-06-25 NOTE — Assessment & Plan Note (Addendum)
 Stable, chronic.  Continue current medication.... given patient having some dizziness in AMs will change med to night.   losartan  hydrochlorothiazide  100/25 mg p.o. daily

## 2024-06-25 NOTE — Assessment & Plan Note (Addendum)
 Chronic, moderate control.  Trulicity   3 mg weekly.  Jardiance  10 mg daily.... given  A1C not < 7.. will increase to 25 mg daily Work on [employer] and restart exercise.

## 2024-06-25 NOTE — Progress Notes (Signed)
 Patient ID: Katie [name], female    DOB: [birth year], 78 y.o.   MRN: [id number]  This visit was conducted in person.  BP (!) 110/42 (BP Location: Right Arm) Comment: checked in the same arm due to left arm restriction  Pulse 74   Temp 97.9 F (36.6 C) (Oral)   Ht 5' 1.75 (1.568 m)   Wt 157 lb (71.2 kg)   LMP 08/26/1992 (Approximate)   SpO2 98%   BMI 28.95 kg/m    CC:  Chief Complaint  Patient presents with   Annual Exam    Subjective:   HPI: Katie [name] is a 78 y.o. female presenting on 06/25/2024 for Annual Exam  The patient presents for annual review of chronic health problems. He/She also has the following acute concerns today: has feeling weaker and fatigued lately. Dizziness with standing..,. feels better through the day.   Has rash on left lower chin.. red itchy,  flaky.  Diabetes: She is on Trulicity  3mg  weekly with previously good control of diabetes. She did have prednisone  in September.  Also on Jardiance   10 mg daily  Lab Results  Component Value Date   HGBA1C 7.4 (H) 06/23/2024  Using medications without difficulties: none Hypoglycemic episodes: none Hyperglycemic episodes: none Feet problems: no ulcers  Blood Sugars averaging: FBS 130-140 eye exam within last year: yes  Wt Readings from Last 3 Encounters:  06/25/24 157 lb (71.2 kg)  06/10/24 153 lb (69.4 kg)  04/09/24 156 lb (70.8 kg)   Hypertension:  Well-controlled on losartan  hydrochlorothiazide  100/25 mg p.o. daily BP Readings from Last 3 Encounters:  06/25/24 (!) 110/42  04/09/24 (!) 128/4  12/18/23 130/60  Using medication without problems or lightheadedness:   occ with standing feeling dizzy. Chest pain with exertion: none Edema: none Short of breath:  stable... see pulmonary note Average home BPs: 140/ Other issues:  Elevated Cholesterol: Cholesterol almost at goal on atorvastatin  40 mg p.o. daily and fenofibrate  145 mg p.o. daily Lab Results  Component Value Date   CHOL  152 06/23/2024   HDL 45.50 06/23/2024   LDLCALC 82 06/23/2024   LDLDIRECT 57.0 05/02/2020   TRIG 123.0 06/23/2024   CHOLHDL 3 06/23/2024  Using medications without problems: Muscle aches:  Diet compliance: heart healthy Exercise: 3 times a week Other complaints:   Breast cancer active treatment on Arimidex  x 4 years, plan 5-7  Osteoporosis treated on Fosamax  70 mg weekly, calcium  and vitamin D .   Followed by Dr. [facility] for reactive airways and OSA On CPAP and using Symbicort   inhaler.  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 Interim medical history since our last visit reviewed. Allergies and medications reviewed and updated. Outpatient Medications Prior to Visit  Medication Sig Dispense Refill   valACYclovir  (VALTREX ) 500 MG tablet TAKE 1 TABLET(500 MG) BY MOUTH TWICE DAILY (Patient taking differently: Taking PRN) 6 tablet 2   ACCU-CHEK GUIDE TEST test strip Use to check blood sugar up to 2 times of day 200 each 3   albuterol  (VENTOLIN  HFA) 108 (90 Base) MCG/ACT inhaler Inhale 2 puffs into the lungs every 6 (six) hours as needed. 8 g 2   alendronate  (FOSAMAX ) 70 MG tablet TAKE 1 TABLET(70 MG) BY MOUTH 1 TIME A WEEK WITH A FULL GLASS OF WATER AND ON AN EMPTY STOMACH 12 tablet 3   anastrozole  (ARIMIDEX ) 1 MG tablet TAKE 1 TABLET(1 MG) BY MOUTH  DAILY 90 tablet 3   aspirin  EC 81 MG tablet Take 81 mg by mouth daily. Swallow whole.     atorvastatin  (LIPITOR) 40 MG tablet Take 1 tablet (40 mg total) by mouth daily. 90 tablet 3   budesonide -formoterol  (SYMBICORT ) 160-4.5 MCG/ACT inhaler INHALE 2 PUFFS INTO THE LUNGS IN THE MORNING AND AT BEDTIME 10.2 g 12   Calcium  Carbonate-Vit D-Min (CALTRATE 600+D PLUS MINERALS) 600-800 MG-UNIT TABS Take 1 tablet by mouth in the morning and at bedtime.     clobetasol (TEMOVATE) 0.05 % external solution Apply 1 application topically 2 (two)  times daily as needed (scalp). Reported on 08/16/2015     Dulaglutide  (TRULICITY ) 3 MG/0.5ML SOAJ Inject 3 mg as directed once a week. 6 mL 11   fenofibrate  (TRICOR ) 145 MG tablet Take 1 tablet (145 mg total) by mouth daily. 90 tablet 3   fluticasone  (FLONASE ) 50 MCG/ACT nasal spray SHAKE LIQUID AND USE 2 SPRAYS IN EACH NOSTRIL EVERY DAY 16 g 0   gabapentin  (NEURONTIN ) 300 MG capsule TAKE 1 CAPSULE(300 MG) BY MOUTH AT BEDTIME 90 capsule 3   loratadine  (CLARITIN ) 10 MG tablet Take 10 mg by mouth daily.     losartan -hydrochlorothiazide  (HYZAAR) 100-25 MG tablet TAKE 1 TABLET BY MOUTH DAILY 90 tablet 0   metroNIDAZOLE (METROCREAM) 0.75 % cream Apply 1 Application topically 2 (two) times daily as needed.     Multiple Vitamins-Minerals (CENTRUM SILVER 50+WOMEN) TABS Take 1 tablet by mouth daily.     Polyethyl Glycol-Propyl Glycol (SYSTANE OP) Apply to eye as needed.     Probiotic Product (PROBIOTIC DAILY PO) Take by mouth. [employer] Dig[employer] Advantage     psyllium (METAMUCIL) 58.6 % powder Take 1 packet by mouth daily.     JARDIANCE  10 MG TABS tablet TAKE 1 TABLET(10 MG) BY MOUTH DAILY BEFORE BREAKFAST 30 tablet 11   No facility-administered medications prior to visit.     Per HPI unless specifically indicated in ROS section below Review of Systems  Constitutional:  Negative for fatigue and fever.  HENT:  Negative for congestion.   Eyes:  Negative for pain.  Respiratory:  Negative for cough and shortness of breath.   Cardiovascular:  Negative for chest pain, palpitations and leg swelling.  Gastrointestinal:  Negative for abdominal pain.  Genitourinary:  Negative for dysuria and vaginal bleeding.  Musculoskeletal:  Negative for back pain.  Neurological:  Negative for syncope, light-headedness and headaches.  Psychiatric/Behavioral:  Negative for dysphoric mood.    Objective:  BP (!) 110/42 (BP Location: Right Arm) Comment: checked in the same arm due to left arm restriction  Pulse 74   Temp  97.9 F (36.6 C) (Oral)   Ht 5' 1.75 (1.568 m)   Wt 157 lb (71.2 kg)   LMP 08/26/1992 (Approximate)   SpO2 98%   BMI 28.95 kg/m   Wt Readings from Last 3 Encounters:  06/25/24 157 lb (71.2 kg)  06/10/24 153 lb (69.4 kg)  04/09/24 156 lb (70.8 kg)      Physical Exam Vitals and nursing note reviewed.  Constitutional:      General: She is not in acute distress.    Appearance: Normal appearance. She is well-developed. She is not ill-appearing or toxic-appearing.  HENT:     Head: Normocephalic.     Right Ear: Hearing, tympanic membrane, ear canal and external ear normal.     Left Ear: Hearing, tympanic membrane, ear canal and external ear normal.     Nose: Nose normal.  Eyes:     General: Lids are normal. Lids are everted, no foreign bodies appreciated.     Conjunctiva/sclera: Conjunctivae normal.     Pupils: Pupils are equal, round, and reactive to light.  Neck:     Thyroid : No thyroid  mass or thyromegaly.     Vascular: No carotid bruit.     Trachea: Trachea normal.  Cardiovascular:     Rate and Rhythm: Normal rate and regular rhythm.     Heart sounds: Normal heart sounds, S1 normal and S2 normal. No murmur heard.    No gallop.  Pulmonary:     Effort: Pulmonary effort is normal. No respiratory distress.     Breath sounds: Normal breath sounds. No wheezing, rhonchi or rales.  Abdominal:     General: Bowel sounds are normal. There is no distension or abdominal bruit.     Palpations: Abdomen is soft. There is no fluid wave or mass.     Tenderness: There is no abdominal tenderness. There is no guarding or rebound.     Hernia: No hernia is present.  Musculoskeletal:     Cervical back: Normal range of motion and neck supple.  Lymphadenopathy:     Cervical: No cervical adenopathy.  Skin:    General: Skin is warm and dry.     Findings: Rash present.         Comments: Scaly erythematous rash on left lower chin  Neurological:     Mental Status: She is alert.     Cranial  Nerves: No cranial nerve deficit.     Sensory: No sensory deficit.  Psychiatric:        Mood and Affect: Mood is not anxious or depressed.        Speech: Speech normal.        Behavior: Behavior normal. Behavior is cooperative.        Judgment: Judgment normal.       Diabetic foot exam: Normal inspection No skin breakdown   No calluses  Normal DP pulses Normal sensation to light touch and monofilament Nails normal  Results for orders placed or performed in visit on 06/25/24  [facility] DIABETES FOOT EXAM   Collection Time: 06/25/24 12:00 AM  Result Value Ref Range   [facility] Diabetic Foot Exam done   TSH   Collection Time: 06/25/24  4:06 PM  Result Value Ref Range   TSH 1.34 0.40 - 4.50 mIU/L   *Note: Due to a large number of results and/or encounters for the requested time period, some results have not been displayed. A complete set of results can be found in Results Review.     COVID 19 screen:  No recent travel or known exposure to COVID19 The patient denies respiratory symptoms of COVID 19 at this time. The importance of social distancing was discussed today.   Assessment and Plan   The patient's preventative maintenance and recommended screening tests for an annual wellness exam were reviewed in full today. Brought up to date unless services declined.  Counselled on the importance of diet, exercise, and its role in overall health and mortality. The patient's FH and SH was reviewed, including their home life, tobacco status, and drug and alcohol status.   Last PAP 09/2014 nml, neg HPV, no further indicated. DVE:  no family history of ovarian or uterine cancer  No further mammogram after bilateral mastectomy. 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  On fosamax  70 mg weekly,  Also on Arimidex  which can cause bone loss.  Repeat stable oin 09/2022 Vaccines: uptodate flu,Td, uptodate with PNA and COVID series x 5, shingrix HAD RSVlast  year Colon: Dr. Federico, 03/2023, no further indicated. Hep C: neg Nonsmoker    Problem List Items Addressed This Visit     Bronchiectasis without complication (HCC)   Secondary to chemo. Followed by pulmonary.      Diabetes mellitus type 2 with retinopathy (HCC)   Chronic, moderate control.  Trulicity   3 mg weekly.  Jardiance  10 mg daily.... given  A1C not < 7.. will increase to 25 mg daily Work on [employer] and restart exercise.      Relevant Medications   empagliflozin  (JARDIANCE ) 25 MG TABS tablet   Diabetic retinopathy (HCC)   Associated with diabetes. Yearly ophthalmology exam completed.      Relevant Medications   empagliflozin  (JARDIANCE ) 25 MG TABS tablet   Hyperlipidemia associated with type 2 diabetes mellitus (HCC)   Stable, chronic.  Continue current medication.   Atorvastatin  40 mg daily Fenofibrate  145 m,g daily      Relevant Medications   empagliflozin  (JARDIANCE ) 25 MG TABS tablet   Hypertension associated with diabetes (HCC) (Chronic)   Stable, chronic.  Continue current medication.... given patient having some dizziness in AMs will change med to night.   losartan  hydrochlorothiazide  100/25 mg p.o. daily      Relevant Medications   empagliflozin  (JARDIANCE ) 25 MG TABS tablet   Malignant neoplasm of upper-outer quadrant of left breast in female, estrogen receptor positive (HCC)   Active treatment by oncology with Arimidex  on year  5 of 7.      Rash   Acute, appears most consistent with contact dermatitis versus prednisone  perioral dermatitis.  Will treat with topical triamcinolone  cream x 2 weeks.  If not improving will refer to dermatology.      Other Visit Diagnoses       Routine general medical examination at a health care facility    -  Primary     Other fatigue       Relevant Orders   TSH (Completed)        Orders Placed This Encounter  Procedures   TSH   [facility] DIABETES FOOT EXAM    This external order was created through the  Results Console.     Greig Ring, MD

## 2024-06-25 NOTE — Assessment & Plan Note (Signed)
 Stable, chronic.  Continue current medication.   Atorvastatin  40 mg daily Fenofibrate  145 m,g daily

## 2024-06-26 LAB — TSH: TSH: 1.34 m[IU]/L (ref 0.40–4.50)

## 2024-06-28 ENCOUNTER — Encounter: Payer: Self-pay | Admitting: Family Medicine

## 2024-06-28 DIAGNOSIS — M810 Age-related osteoporosis without current pathological fracture: Secondary | ICD-10-CM

## 2024-06-29 ENCOUNTER — Ambulatory Visit: Payer: Self-pay | Admitting: Family Medicine

## 2024-06-29 MED ORDER — TRIAMCINOLONE ACETONIDE 0.5 % EX CREA: 1.0000 | TOPICAL_CREAM | Freq: Two times a day (BID) | CUTANEOUS | 0 refills | Status: DC

## 2024-06-29 NOTE — Assessment & Plan Note (Signed)
 Acute, appears most consistent with contact dermatitis versus prednisone  perioral dermatitis.  Will treat with topical triamcinolone  cream x 2 weeks.  If not improving will refer to dermatology.

## 2024-07-08 ENCOUNTER — Ambulatory Visit: Payer: Medicare PPO

## 2024-07-12 ENCOUNTER — Other Ambulatory Visit: Payer: Self-pay | Admitting: Family Medicine

## 2024-07-14 ENCOUNTER — Ambulatory Visit
Admission: RE | Admit: 2024-07-14 | Discharge: 2024-07-14 | Disposition: A | Discharge status: Home / Self Care | Source: Ambulatory Visit | Attending: Neurosurgery | Admitting: Neurosurgery

## 2024-07-14 DIAGNOSIS — I671 Cerebral aneurysm, nonruptured: Secondary | ICD-10-CM | POA: Insufficient documentation

## 2024-07-14 DIAGNOSIS — Z8679 Personal history of other diseases of the circulatory system: Secondary | ICD-10-CM | POA: Diagnosis not present

## 2024-07-14 DIAGNOSIS — Z9889 Other specified postprocedural states: Secondary | ICD-10-CM | POA: Diagnosis not present

## 2024-07-16 DIAGNOSIS — I671 Cerebral aneurysm, nonruptured: Secondary | ICD-10-CM | POA: Diagnosis not present

## 2024-07-16 DIAGNOSIS — Z9889 Other specified postprocedural states: Secondary | ICD-10-CM | POA: Diagnosis not present

## 2024-07-16 DIAGNOSIS — Z8679 Personal history of other diseases of the circulatory system: Secondary | ICD-10-CM | POA: Diagnosis not present

## 2024-08-09 ENCOUNTER — Inpatient Hospital Stay: Payer: Medicare Other | Attending: Hematology and Oncology | Admitting: Hematology and Oncology

## 2024-08-09 VITALS — BP 122/44 | HR 64 | Temp 97.6°F | Resp 16 | Ht 61.75 in | Wt 158.3 lb

## 2024-08-09 DIAGNOSIS — Z79899 Other long term (current) drug therapy: Secondary | ICD-10-CM | POA: Diagnosis not present

## 2024-08-09 DIAGNOSIS — M810 Age-related osteoporosis without current pathological fracture: Secondary | ICD-10-CM | POA: Insufficient documentation

## 2024-08-09 DIAGNOSIS — Z1721 Progesterone receptor positive status: Secondary | ICD-10-CM | POA: Insufficient documentation

## 2024-08-09 DIAGNOSIS — Z170 Estrogen receptor positive status [ER+]: Secondary | ICD-10-CM

## 2024-08-09 DIAGNOSIS — C50412 Malignant neoplasm of upper-outer quadrant of left female breast: Secondary | ICD-10-CM | POA: Insufficient documentation

## 2024-08-09 DIAGNOSIS — Z1731 Human epidermal growth factor receptor 2 positive status: Secondary | ICD-10-CM | POA: Diagnosis not present

## 2024-08-09 DIAGNOSIS — Z9013 Acquired absence of bilateral breasts and nipples: Secondary | ICD-10-CM | POA: Diagnosis not present

## 2024-08-09 DIAGNOSIS — Z79811 Long term (current) use of aromatase inhibitors: Secondary | ICD-10-CM | POA: Insufficient documentation

## 2024-08-09 NOTE — Progress Notes (Signed)
 Patient Care Team: Avelina Greig BRAVO, MD as PCP - General (Family Medicine) Darliss Rogue, MD as PCP - Cardiology (Cardiology) Jaye Fallow, MD as Referring Physician (Ophthalmology) Chrystie Edmund CROME, MD as Consulting Physician (Dermatology) Shelva Dunnings, MD as Consulting Physician (General Surgery) Danley Shuck, MD as Consulting Physician (Dentistry) Odean Potts, MD as Consulting Physician (Hematology and Oncology) Izell Domino, MD as Attending Physician (Radiation Oncology) Ebbie Cough, MD as Consulting Physician (General Surgery)  DIAGNOSIS:  Encounter Diagnosis  Name Primary?   Malignant neoplasm of upper-outer quadrant of left breast in female, estrogen receptor positive (HCC) Yes    SUMMARY OF ONCOLOGIC HISTORY: Oncology History  Malignant neoplasm of upper-outer quadrant of left breast in female, estrogen receptor positive (HCC)  09/14/2018 Initial Diagnosis   With prior history of breast implants that were ruptured/replaced, patient had cellulitis LIQ left breast for 1 week, and UOQ asymmetry 1.5 cm, MRI right breast biopsy benign, left breast numerous masses 7 cm skin thickening: 2 biopsies from left breast: Grade 2-3 IDC ER PR positive, HER-2 positive, Ki-67 50%   09/16/2018 Cancer Staging   Staging form: Breast, AJCC 8th Edition - Clinical stage from 09/16/2018: Stage IIIB (cT4, cN0, cM0, G3, ER+, PR+, HER2+)    09/23/2018 Genetic Testing   Genetic testing performed through [facility]'s Common Hereditary Cancers Panel reported out on 09/23/2018 showed no pathogenic mutations.    The Common Hereditary Cancers Panel offered by [facility] includes sequencing and/or deletion duplication testing of the following 47 genes: APC, ATM, AXIN2, BARD1, BMPR1A, BRCA1, BRCA2, BRIP1, CDH1, CDKN2A (p14ARF), CDKN2A (p16INK4a), CKD4, CHEK2, CTNNA1, DICER1, EPCAM (Deletion/duplication testing only), GREM1 (promoter region deletion/duplication testing only), KIT, MEN1, MLH1,  MSH2, MSH3, MSH6, MUTYH, NBN, NF1, NHTL1, PALB2, PDGFRA, PMS2, POLD1, POLE, PTEN, RAD50, RAD51C, RAD51D, SDHB, SDHC, SDHD, SMAD4, SMARCA4. STK11, TP53, TSC1, TSC2, and VHL.  The following genes were evaluated for sequence changes only: SDHA and HOXB13 c.251G>A variant only.   09/30/2018 - 07/06/2020 Chemotherapy   palonosetron  (ALOXI ) injection 0.25 mg, 0.25 mg, Intravenous,  Once, 6 of 6 cycles. Administration: 0.25 mg (09/30/2018), 0.25 mg (10/21/2018), 0.25 mg (12/23/2018), 0.25 mg (01/13/2019), 0.25 mg (11/11/2018), 0.25 mg (12/02/2018)  pegfilgrastim -cbqv (UDENYCA ) injection 6 mg, 6 mg, Subcutaneous, Once, 5 of 5 cycles. Administration: 6 mg (10/02/2018), 6 mg (10/23/2018), 6 mg (11/13/2018), 6 mg (12/04/2018), 6 mg (12/25/2018)  trastuzumab  (HERCEPTIN ) 600 mg in sodium chloride  0.9 % 250 mL chemo infusion, 609 mg, Intravenous,  Once, 6 of 6 cycles. Administration: 600 mg (09/30/2018), 450 mg (10/21/2018), 450 mg (12/23/2018), 450 mg (01/13/2019), 450 mg (11/11/2018), 450 mg (12/02/2018)  CARBOplatin  (PARAPLATIN ) 510 mg in sodium chloride  0.9 % 250 mL chemo infusion, 510 mg (103.4 % of original dose 493.2 mg), Intravenous,  Once, 6 of 6 cycles. Dose modification:   (original dose 493.2 mg, Cycle 1). Administration: 510 mg (09/30/2018), 430 mg (10/21/2018), 420 mg (12/23/2018), 420 mg (01/13/2019), 430 mg (11/11/2018), 430 mg (12/02/2018)  DOCEtaxel  (TAXOTERE ) 140 mg in sodium chloride  0.9 % 250 mL chemo infusion, 75 mg/m2 = 140 mg, Intravenous,  Once, 5 of 5 cycles. Dose modification: 60 mg/m2 (original dose 75 mg/m2, Cycle 2, Reason: Dose not tolerated). Administration: 140 mg (09/30/2018), 110 mg (10/21/2018), 110 mg (12/23/2018), 110 mg (11/11/2018), 110 mg (12/02/2018)  pertuzumab  (PERJETA ) 420 mg in sodium chloride  0.9 % 250 mL chemo infusion, 420 mg (100 % of original dose 420 mg), Intravenous, Once, 1 of 1 cycle. Dose modification: 420 mg (original dose 420 mg, Cycle 1, Reason: Provider  Judgment). Administration: 420 mg  (09/30/2018)  fosaprepitant  (EMEND) 150 mg  dexamethasone  (DECADRON ) 12 mg in sodium chloride  0.9 % 145 mL IVPB, , Intravenous,  Once, 6 of 6 cycles. Administration:  (09/30/2018),  (10/21/2018),  (12/23/2018),  (01/13/2019),  (11/11/2018),  (12/02/2018)  ado-trastuzumab emtansine  (KADCYLA ) 260 mg in sodium chloride  0.9 % 250 mL chemo infusion, 3.6 mg/kg = 260 mg, Intravenous, Once, 6 of 10 cycles. Dose modification: 3 mg/kg (original dose 3.6 mg/kg, Cycle 5, Reason: Dose not tolerated), 2.4 mg/kg (original dose 3.6 mg/kg, Cycle 6, Reason: Provider Judgment). Administration: 260 mg (03/24/2019), 260 mg (04/14/2019), 200 mg (06/16/2019), 260 mg (05/05/2019), 200 mg (05/26/2019), 160 mg (07/07/2019)   01/19/2019 Breast MRI   Significant interval improvement, the multiple left breast masses and areas of non mass enhancement in all 4 quadrants have decreased in size and extent. The mass previously measuring 1.4 cm with diffuse enhancement now measures 9 mm with heterogeneous areas of enhancement. Current greatest transverse dimension is 5.4 x 3.9 cm, previously 7 cm x 5cm. 2. 4 mm masslike focus of enhancement at the base of the right nipple is unchanged.   03/08/2019 Surgery   Bilateral mastectomies Viktoria) [phone]): right breast: no malignancy; left breast: IDC, grade 2, scattered over 7.5cm, clear margins. No lymph nodes were examined.   03/08/2019 Cancer Staging   Staging form: Breast, AJCC 8th Edition - Pathologic stage from 03/08/2019: No Stage Recommended (ypT3, pNX, cM0, G2, ER+, PR+, HER2-)   05/04/2019 - 06/14/2019 Radiation Therapy   The patient initially received a dose of 50 Gy in 25 fractions to the breast using whole-breast tangent fields. This was delivered using a 3-D conformal technique. The patient also received 50 Gy in 25 fractions to the left supraclavicular region. The pt received a boost delivering an additional 10 Gy in 5 fractions using a electron boost with electrons. The total  dose was 110 Gy.   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  She completed definitive therapy in 2020 and has taken adjuvant anastrozole  for five years, with a plan to continue for a total of seven years. She has no significant anastrozole  side effects other than progressive, increasingly noticeable hair thinning.  She has no chest pain. She has chronic pain and tightness at the chest wall scar that is less severe than before but persists. She also has persistent tightness and some limitation in right arm mobility, which improved with physical therapy but is not fully resolved.  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 She has no current neuropathy but continues gabapentin  and is unsure if symptoms would recur if stopped.  She has osteoporosis treated with Fosamax  and vitamin D . She was advised to continue calcium  after a prior elevated serum calcium . A bone density scan is planned in the coming months. She has no current gastrointestinal symptoms aside from occasional medication-related gastritis.  She had a presyncopal episode in February 2025 attributed to dehydration that resolved with oral fluids and rest. She occasionally has low blood pressure with poor oral intake and typically drinks four to six glasses of water per day.      ALLERGIES:  is allergic to erythromycin, ciprofloxacin, daypro [oxaprozin],  enalapril maleate, nsaids, and vioxx [rofecoxib].  MEDICATIONS:  Current Outpatient Medications  Medication Sig Dispense Refill   albuterol  (VENTOLIN  HFA) 108 (90 Base) MCG/ACT inhaler Inhale 2 puffs into the lungs every 6  (six) hours as needed. 8 g 2   alendronate  (FOSAMAX ) 70 MG tablet TAKE 1 TABLET(70 MG) BY MOUTH 1 TIME A WEEK WITH A FULL GLASS OF WATER AND ON AN EMPTY STOMACH 12 tablet 3   anastrozole  (ARIMIDEX ) 1 MG tablet TAKE 1 TABLET(1 MG) BY MOUTH DAILY 90 tablet 3   aspirin  EC 81 MG tablet Take 81 mg by mouth daily. Swallow whole.     atorvastatin  (LIPITOR) 40 MG tablet Take 1 tablet (40 mg total) by mouth daily. 90 tablet 3   budesonide -formoterol  (SYMBICORT ) 160-4.5 MCG/ACT inhaler INHALE 2 PUFFS INTO THE LUNGS IN THE MORNING AND AT BEDTIME 10.2 g 12   clobetasol (TEMOVATE) 0.05 % external solution Apply 1 application topically 2 (two) times daily as needed (scalp). Reported on 08/16/2015     Dulaglutide  (TRULICITY ) 3 MG/0.5ML SOAJ Inject 3 mg as directed once a week. 6 mL 11   empagliflozin  (JARDIANCE ) 25 MG TABS tablet Take 1 tablet (25 mg total) by mouth daily. 30 tablet 11   fenofibrate  (TRICOR ) 145 MG tablet Take 1 tablet (145 mg total) by mouth daily. 90 tablet 3   fluticasone  (FLONASE ) 50 MCG/ACT nasal spray SHAKE LIQUID AND USE 2 SPRAYS IN EACH NOSTRIL EVERY DAY 16 g 2   gabapentin  (NEURONTIN ) 300 MG capsule TAKE 1 CAPSULE(300 MG) BY MOUTH AT BEDTIME 90 capsule 3   loratadine  (CLARITIN ) 10 MG tablet Take 10 mg by mouth daily.     losartan -hydrochlorothiazide  (HYZAAR) 100-25 MG tablet TAKE 1 TABLET BY MOUTH DAILY 90 tablet 0   metroNIDAZOLE (METROCREAM) 0.75 % cream Apply 1 Application topically 2 (two) times daily as needed.     Multiple Vitamins-Minerals (CENTRUM SILVER 50+WOMEN) TABS Take 1 tablet by mouth daily.     Polyethyl Glycol-Propyl Glycol (SYSTANE OP) Apply to eye as needed.     Probiotic Product (PROBIOTIC DAILY PO) Take by mouth. Schiff Digestive Advantage     psyllium (METAMUCIL) 58.6 % powder Take 1 packet by mouth daily.     valACYclovir  (VALTREX ) 500 MG tablet TAKE 1 TABLET(500 MG) BY MOUTH TWICE DAILY 6 tablet 2   ACCU-CHEK GUIDE TEST test strip Use to check blood sugar up to  2 times of day 200 each 3   triamcinolone  cream (KENALOG ) 0.5 % Apply 1 Application topically 2 (two) times daily. (Patient not taking: Reported on 08/09/2024) 30 g 0   No current facility-administered medications for this visit.    PHYSICAL EXAMINATION: ECOG PERFORMANCE STATUS: 1 - Symptomatic but completely ambulatory  Vitals:   08/09/24 0936  BP: (!) 122/44  Pulse: 64  Resp: 16  Temp: 97.6 F (36.4 C)  SpO2: 97%   Filed Weights   08/09/24 0936  Weight: 158 lb 4.8 oz (71.8 kg)    Physical Exam   (exam performed in the presence of a chaperone)  LABORATORY DATA:  I have reviewed the data as listed    Latest Ref Rng & Units 06/23/2024    8:32 AM 12/11/2023    7:50 AM 10/17/2023   12:57 PM  CMP  Glucose 70 - 99 mg/dL 845  831  859   BUN 6 - 23 mg/dL 18  22  23    Creatinine 0.40 - 1.20 mg/dL 8.88  8.72  8.90   Sodium 135 -  145 mEq/L 134  137  136   Potassium 3.5 - 5.1 mEq/L 4.0  3.5  3.6   Chloride 96 - 112 mEq/L 99  98  97   CO2 19 - 32 mEq/L 26  30  30    Calcium  8.4 - 10.5 mg/dL 9.1  89.6  9.8   Total Protein 6.0 - 8.3 g/dL 6.6  7.2    Total Bilirubin 0.2 - 1.2 mg/dL 0.7  0.6    Alkaline Phos 39 - 117 U/L 38  34    AST 0 - 37 U/L 27  31    ALT 0 - 35 U/L 27  24      Lab Results  Component Value Date   WBC 3.5 (L) 06/10/2024   HGB 13.0 06/10/2024   HCT 38.4 06/10/2024   MCV 96.1 06/10/2024   PLT 254.0 06/10/2024   NEUTROABS 2.1 06/10/2024    ASSESSMENT & PLAN:  Malignant neoplasm of upper-outer quadrant of left breast in female, estrogen receptor positive (HCC) With prior history of breast implants that were ruptured/replaced, patient had cellulitis LIQ left breast for 1 week, and UOQ asymmetry 1.5 cm, MRI right breast biopsy benign, left breast numerous masses 7 cm skin thickening: 2 biopsies from left breast: Grade 2-3 IDC ER PR positive, HER-2 positive, Ki-67 50% Clinical stage: T4 N0 M0 stage IIIb   Treatment summary: 1. Neoadjuvant chemotherapy with  [facility] Perjeta  6 cycles completed 12/30/2018 followed by Herceptin  maintenance for 1 year completed 12/01/2019 2. Followed by Bil mastectomies with sentinel lymph node study 03/08/2019: Left mastectomy: Grade 2 IDC spanning 7.5 cm, margins negative, right mastectomy: Benign, ER 95%, PR 5%, HER-2 1+ negative pathology lost the lymph node excision 3. Followed by adjuvant radiation therapy started 05/04/2019-06/14/2019 4.  Followed by adjuvant antiestrogen therapy started 06/16/2019 Patient did not want to take neratinib ------------------------------------------------------------------------------------------------------------------------------------ Chemo-induced peripheral neuropathy: On gabapentin , it apparently got worse this year and she is continuing the gabapentin . Anastrozole  toxicities: Hair thinning Recommended 2 more years of anastrozole  therapy   CT CAP 12/08/2019: Postsurgical and postradiation changes.  Scattered areas of bronchiectasis in the lungs.  Atherosclerosis and diverticulosis.  No evidence of metastatic disease.   Breast cancer surveillance: 1.  Breast exam 08/09/2024: Benign 2. mammogram: No role of imaging studies since she had bilateral mastectomies.   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   Osteoporosis: On Fosamax  with no reported issues.   Assessment & Plan Estrogen receptor positive malignant neoplasm of the upper-outer quadrant of the left breast - Continue anastrozole  for two more years, total seven years, unless intolerable side effects. - Discussed hair thinning; Rogaine may be considered. - No additional anastrozole  refills needed until next year.  Chemotherapy-induced peripheral neuropathy Symptoms resolved, but gabapentin  continued. Uncertainty about recurrence if discontinued. Long-term monitoring ongoing. - Taper gabapentin : every other day for one week, then every third day for one week, then discontinue. - Resume gabapentin  if neuropathy symptoms  recur.  Osteoporosis Managed with alendronate  and vitamin D . Bone health closely monitored due to aromatase inhibitor therapy and cancer history. - Continue alendronate  and vitamin D  supplementation. - Emphasized importance of vitamin D  supplementation, especially with limited sun exposure. - Bone density scan scheduled for January or February; review results then.  Follow-up in 1 year    No orders of the defined types were placed in this encounter.  The patient has a good understanding of the overall plan. she agrees with it. she will call with any problems that may develop before  the next visit here.  I personally spent a total of 30 minutes in the care of the patient today including preparing to see the patient, getting/reviewing separately obtained history, performing a medically appropriate exam/evaluation, counseling and educating, placing orders, referring and communicating with other health care professionals, documenting clinical information in the EHR, independently interpreting results, communicating results, and coordinating care.   Viinay K Torren Maffeo, MD 08/09/2024

## 2024-08-09 NOTE — Assessment & Plan Note (Signed)
 With prior history of breast implants that were ruptured/replaced, patient had cellulitis LIQ left breast for 1 week, and UOQ asymmetry 1.5 cm, MRI right breast biopsy benign, left breast numerous masses 7 cm skin thickening: 2 biopsies from left breast: Grade 2-3 IDC ER PR positive, HER-2 positive, Ki-67 50% Clinical stage: T4 N0 M0 stage IIIb   Treatment summary: 1. Neoadjuvant chemotherapy with [facility] Perjeta  6 cycles completed 12/30/2018 followed by Herceptin  maintenance for 1 year completed 12/01/2019 2. Followed by Bil mastectomies with sentinel lymph node study 03/08/2019: Left mastectomy: Grade 2 IDC spanning 7.5 cm, margins negative, right mastectomy: Benign, ER 95%, PR 5%, HER-2 1+ negative pathology lost the lymph node excision 3. Followed by adjuvant radiation therapy started 05/04/2019-06/14/2019 4.  Followed by adjuvant antiestrogen therapy started 06/16/2019 Patient did not want to take neratinib ------------------------------------------------------------------------------------------------------------------------------------ Chemo-induced peripheral neuropathy: On gabapentin , it apparently got worse this year and she is continuing the gabapentin . Anastrozole  toxicities: None   CT CAP 12/08/2019: Postsurgical and postradiation changes.  Scattered areas of bronchiectasis in the lungs.  Atherosclerosis and diverticulosis.  No evidence of metastatic disease.   Breast cancer surveillance: 1.  Breast exam 08/09/2024: Benign 2. mammogram: No role of imaging studies since she had bilateral mastectomies.   Brain aneurysm: Following at [facility].   Right upper quadrant abdominal pain:08/10/21 CT abdomen and pelvis: Benign Return to clinic in 1 year for follow-up   Osteoporosis: On Fosamax  with no reported issues.    Follow-up in 1 year

## 2024-08-14 ENCOUNTER — Other Ambulatory Visit: Payer: Self-pay | Admitting: Family Medicine

## 2024-09-25 ENCOUNTER — Other Ambulatory Visit: Payer: Self-pay | Admitting: Family Medicine

## 2024-09-28 ENCOUNTER — Ambulatory Visit: Admitting: Family Medicine

## 2024-09-28 ENCOUNTER — Encounter: Payer: Self-pay | Admitting: Family Medicine

## 2024-09-28 VITALS — BP 140/60 | HR 79 | Temp 98.0°F | Ht 61.75 in | Wt 154.1 lb

## 2024-09-28 DIAGNOSIS — R1011 Right upper quadrant pain: Secondary | ICD-10-CM

## 2024-09-28 DIAGNOSIS — Z7985 Long-term (current) use of injectable non-insulin antidiabetic drugs: Secondary | ICD-10-CM

## 2024-09-28 DIAGNOSIS — E113293 Type 2 diabetes mellitus with mild nonproliferative diabetic retinopathy without macular edema, bilateral: Secondary | ICD-10-CM | POA: Diagnosis not present

## 2024-09-28 DIAGNOSIS — E1159 Type 2 diabetes mellitus with other circulatory complications: Secondary | ICD-10-CM

## 2024-09-28 DIAGNOSIS — E785 Hyperlipidemia, unspecified: Secondary | ICD-10-CM

## 2024-09-28 DIAGNOSIS — I152 Hypertension secondary to endocrine disorders: Secondary | ICD-10-CM

## 2024-09-28 DIAGNOSIS — E1169 Type 2 diabetes mellitus with other specified complication: Secondary | ICD-10-CM

## 2024-09-28 DIAGNOSIS — Z7984 Long term (current) use of oral hypoglycemic drugs: Secondary | ICD-10-CM

## 2024-09-28 LAB — POCT GLYCOSYLATED HEMOGLOBIN (HGB A1C): Hemoglobin A1C: 6.6 % — AB (ref 4.0–5.6)

## 2024-09-28 NOTE — Assessment & Plan Note (Signed)
Associated with diabetes. Yearly ophthalmology exam completed.

## 2024-09-28 NOTE — Assessment & Plan Note (Signed)
 Stable, chronic.  Continue current medication.   Atorvastatin  40 mg daily Fenofibrate  145 m,g daily

## 2024-09-28 NOTE — Assessment & Plan Note (Signed)
 Stable, chronic.  Continue current medication.... given patient having some dizziness in AMs will change med to night.   losartan  hydrochlorothiazide  100/25 mg p.o. daily

## 2024-09-28 NOTE — Assessment & Plan Note (Signed)
"   Acute.. may be abd/chest wall injyry from fall several weeks ago... but will eval with labs... r/o pancreatitis . Liver issue etc.  S/P cholecystectomy. "

## 2024-09-29 LAB — COMPREHENSIVE METABOLIC PANEL WITH GFR
ALT: 25 U/L (ref 3–35)
AST: 29 U/L (ref 5–37)
Albumin: 4.2 g/dL (ref 3.5–5.2)
Alkaline Phosphatase: 30 U/L — ABNORMAL LOW (ref 39–117)
BUN: 25 mg/dL — ABNORMAL HIGH (ref 6–23)
CO2: 32 meq/L (ref 19–32)
Calcium: 10.7 mg/dL — ABNORMAL HIGH (ref 8.4–10.5)
Chloride: 100 meq/L (ref 96–112)
Creatinine, Ser: 1.15 mg/dL (ref 0.40–1.20)
GFR: 45.21 mL/min — ABNORMAL LOW
Glucose, Bld: 118 mg/dL — ABNORMAL HIGH (ref 70–99)
Potassium: 3.8 meq/L (ref 3.5–5.1)
Sodium: 139 meq/L (ref 135–145)
Total Bilirubin: 0.6 mg/dL (ref 0.2–1.2)
Total Protein: 7.1 g/dL (ref 6.0–8.3)

## 2024-09-29 LAB — LIPASE: Lipase: 22 U/L (ref 11.0–59.0)

## 2024-09-30 ENCOUNTER — Telehealth: Payer: Self-pay | Admitting: Cardiology

## 2024-09-30 ENCOUNTER — Ambulatory Visit: Payer: Self-pay | Admitting: Family Medicine

## 2024-09-30 DIAGNOSIS — E782 Mixed hyperlipidemia: Secondary | ICD-10-CM

## 2024-09-30 NOTE — Telephone Encounter (Signed)
 Patient wants a call back to confirm her lab orders prior to her 4/2 visit.

## 2024-10-01 ENCOUNTER — Telehealth: Payer: Self-pay | Admitting: *Deleted

## 2024-10-01 NOTE — Telephone Encounter (Signed)
 lvm for pt to call office to schedule appt for labs prior to cpe

## 2024-10-01 NOTE — Telephone Encounter (Signed)
 Copied from CRM #[id number]. Topic: Clinical - Request for Lab/Test Order >> Oct 01, 2024  2:51 PM Sophia H wrote: Reason for CRM: Patient is requesting orders for labs to go along with her physical. Scheduled Jun 28, 2025.

## 2024-10-01 NOTE — Telephone Encounter (Signed)
 Please call patient and schedule her lab appointment prior to her physical on 06/28/2025.

## 2024-10-05 ENCOUNTER — Other Ambulatory Visit

## 2024-10-07 ENCOUNTER — Ambulatory Visit: Admitting: Pulmonary Disease

## 2024-10-14 ENCOUNTER — Other Ambulatory Visit

## 2024-11-25 ENCOUNTER — Ambulatory Visit: Admitting: Cardiology

## 2025-06-14 ENCOUNTER — Ambulatory Visit

## 2025-06-28 ENCOUNTER — Encounter: Admitting: Family Medicine

## 2025-08-09 ENCOUNTER — Inpatient Hospital Stay: Admitting: Hematology and Oncology
